# Patient Record
Sex: Male | Born: 1945
Health system: Southern US, Community
[De-identification: ages and names within clinical notes are randomized; demographics above are authoritative.]

## PROBLEM LIST (undated history)

## (undated) DIAGNOSIS — I350 Nonrheumatic aortic (valve) stenosis: Secondary | ICD-10-CM

## (undated) DIAGNOSIS — Z972 Presence of dental prosthetic device (complete) (partial): Secondary | ICD-10-CM

## (undated) DIAGNOSIS — N189 Chronic kidney disease, unspecified: Secondary | ICD-10-CM

## (undated) DIAGNOSIS — M1990 Unspecified osteoarthritis, unspecified site: Secondary | ICD-10-CM

## (undated) DIAGNOSIS — F329 Major depressive disorder, single episode, unspecified: Secondary | ICD-10-CM

## (undated) DIAGNOSIS — Z973 Presence of spectacles and contact lenses: Secondary | ICD-10-CM

## (undated) DIAGNOSIS — Z951 Presence of aortocoronary bypass graft: Secondary | ICD-10-CM

## (undated) DIAGNOSIS — I69959 Hemiplegia and hemiparesis following unspecified cerebrovascular disease affecting unspecified side: Secondary | ICD-10-CM

## (undated) DIAGNOSIS — F419 Anxiety disorder, unspecified: Secondary | ICD-10-CM

## (undated) DIAGNOSIS — I6529 Occlusion and stenosis of unspecified carotid artery: Secondary | ICD-10-CM

## (undated) DIAGNOSIS — I219 Acute myocardial infarction, unspecified: Secondary | ICD-10-CM

## (undated) DIAGNOSIS — R011 Cardiac murmur, unspecified: Secondary | ICD-10-CM

## (undated) DIAGNOSIS — I340 Nonrheumatic mitral (valve) insufficiency: Secondary | ICD-10-CM

## (undated) DIAGNOSIS — E785 Hyperlipidemia, unspecified: Secondary | ICD-10-CM

## (undated) DIAGNOSIS — Z974 Presence of external hearing-aid: Secondary | ICD-10-CM

## (undated) DIAGNOSIS — R17 Unspecified jaundice: Secondary | ICD-10-CM

## (undated) DIAGNOSIS — G4730 Sleep apnea, unspecified: Secondary | ICD-10-CM

## (undated) DIAGNOSIS — R7303 Prediabetes: Secondary | ICD-10-CM

## (undated) DIAGNOSIS — I509 Heart failure, unspecified: Secondary | ICD-10-CM

## (undated) DIAGNOSIS — I639 Cerebral infarction, unspecified: Secondary | ICD-10-CM

## (undated) DIAGNOSIS — I2510 Atherosclerotic heart disease of native coronary artery without angina pectoris: Secondary | ICD-10-CM

## (undated) DIAGNOSIS — Z8679 Personal history of other diseases of the circulatory system: Secondary | ICD-10-CM

## (undated) DIAGNOSIS — I10 Essential (primary) hypertension: Secondary | ICD-10-CM

## (undated) DIAGNOSIS — I351 Nonrheumatic aortic (valve) insufficiency: Secondary | ICD-10-CM

## (undated) DIAGNOSIS — F32A Depression, unspecified: Secondary | ICD-10-CM

## (undated) DIAGNOSIS — J439 Emphysema, unspecified: Secondary | ICD-10-CM

## (undated) DIAGNOSIS — D649 Anemia, unspecified: Secondary | ICD-10-CM

## (undated) DIAGNOSIS — K219 Gastro-esophageal reflux disease without esophagitis: Secondary | ICD-10-CM

## (undated) DIAGNOSIS — Z953 Presence of xenogenic heart valve: Secondary | ICD-10-CM

## (undated) DIAGNOSIS — Z7689 Persons encountering health services in other specified circumstances: Secondary | ICD-10-CM

## (undated) DIAGNOSIS — I480 Paroxysmal atrial fibrillation: Secondary | ICD-10-CM

## (undated) DIAGNOSIS — E119 Type 2 diabetes mellitus without complications: Secondary | ICD-10-CM

## (undated) DIAGNOSIS — I080 Rheumatic disorders of both mitral and aortic valves: Secondary | ICD-10-CM

## (undated) DIAGNOSIS — Z9889 Other specified postprocedural states: Secondary | ICD-10-CM

## (undated) DIAGNOSIS — J449 Chronic obstructive pulmonary disease, unspecified: Secondary | ICD-10-CM

## (undated) HISTORY — DX: Rheumatic disorders of both mitral and aortic valves: I08.0

## (undated) HISTORY — DX: Paroxysmal atrial fibrillation: I48.0

## (undated) HISTORY — DX: Nonrheumatic aortic (valve) insufficiency: I35.1

## (undated) HISTORY — DX: Hemiplegia and hemiparesis following unspecified cerebrovascular disease affecting unspecified side: I69.959

## (undated) HISTORY — PX: POLYPECTOMY: SHX149

## (undated) HISTORY — PX: FOOT SURGERY: SHX648

## (undated) HISTORY — DX: Atherosclerotic heart disease of native coronary artery without angina pectoris: I25.10

## (undated) HISTORY — PX: TONSILLECTOMY: SUR1361

## (undated) HISTORY — PX: CHOLECYSTECTOMY: SHX55

## (undated) HISTORY — PX: BACK SURGERY: SHX140

## (undated) HISTORY — DX: Essential (primary) hypertension: I10

## (undated) HISTORY — DX: Hyperlipidemia, unspecified: E78.5

---

## 2003-03-10 ENCOUNTER — Ambulatory Visit (HOSPITAL_COMMUNITY): Admission: RE | Admit: 2003-03-10 | Discharge: 2003-03-10 | Payer: Self-pay

## 2003-03-10 ENCOUNTER — Encounter (INDEPENDENT_AMBULATORY_CARE_PROVIDER_SITE_OTHER): Payer: Self-pay | Admitting: Specialist

## 2003-03-11 ENCOUNTER — Inpatient Hospital Stay (HOSPITAL_COMMUNITY): Admission: EM | Admit: 2003-03-11 | Discharge: 2003-03-16 | Payer: Self-pay | Admitting: Emergency Medicine

## 2003-03-11 ENCOUNTER — Encounter: Payer: Self-pay | Admitting: Pediatrics

## 2003-03-11 DIAGNOSIS — I639 Cerebral infarction, unspecified: Secondary | ICD-10-CM

## 2003-03-11 HISTORY — DX: Cerebral infarction, unspecified: I63.9

## 2003-03-12 ENCOUNTER — Encounter: Payer: Self-pay | Admitting: Pediatrics

## 2003-03-13 ENCOUNTER — Encounter: Payer: Self-pay | Admitting: Neurology

## 2003-04-22 ENCOUNTER — Encounter: Admission: RE | Admit: 2003-04-22 | Discharge: 2003-07-21 | Payer: Self-pay | Admitting: Pediatrics

## 2003-04-23 ENCOUNTER — Ambulatory Visit (HOSPITAL_COMMUNITY): Admission: RE | Admit: 2003-04-23 | Discharge: 2003-04-23 | Payer: Self-pay | Admitting: Rheumatology

## 2003-06-18 ENCOUNTER — Ambulatory Visit (HOSPITAL_COMMUNITY): Admission: RE | Admit: 2003-06-18 | Discharge: 2003-06-18 | Payer: Self-pay | Admitting: Interventional Radiology

## 2004-01-12 ENCOUNTER — Ambulatory Visit (HOSPITAL_COMMUNITY): Admission: RE | Admit: 2004-01-12 | Discharge: 2004-01-12 | Payer: Self-pay | Admitting: Interventional Radiology

## 2004-02-03 ENCOUNTER — Ambulatory Visit (HOSPITAL_COMMUNITY): Admission: RE | Admit: 2004-02-03 | Discharge: 2004-02-03 | Payer: Self-pay | Admitting: Interventional Radiology

## 2004-06-25 ENCOUNTER — Encounter: Payer: Self-pay | Admitting: Internal Medicine

## 2004-06-25 ENCOUNTER — Ambulatory Visit (HOSPITAL_COMMUNITY): Admission: RE | Admit: 2004-06-25 | Discharge: 2004-06-25 | Payer: Self-pay | Admitting: Internal Medicine

## 2004-11-11 ENCOUNTER — Ambulatory Visit: Payer: Self-pay | Admitting: Internal Medicine

## 2005-01-12 ENCOUNTER — Ambulatory Visit: Payer: Self-pay

## 2005-08-01 ENCOUNTER — Ambulatory Visit: Payer: Self-pay | Admitting: Internal Medicine

## 2005-08-12 ENCOUNTER — Ambulatory Visit: Payer: Self-pay | Admitting: Internal Medicine

## 2005-09-09 ENCOUNTER — Ambulatory Visit: Payer: Self-pay | Admitting: Internal Medicine

## 2005-09-16 ENCOUNTER — Ambulatory Visit: Payer: Self-pay | Admitting: Internal Medicine

## 2005-10-27 ENCOUNTER — Ambulatory Visit: Payer: Self-pay | Admitting: Internal Medicine

## 2005-10-27 ENCOUNTER — Ambulatory Visit: Payer: Self-pay

## 2006-01-27 ENCOUNTER — Ambulatory Visit: Payer: Self-pay | Admitting: Internal Medicine

## 2006-04-27 ENCOUNTER — Ambulatory Visit: Payer: Self-pay

## 2006-05-01 ENCOUNTER — Ambulatory Visit: Payer: Self-pay | Admitting: Internal Medicine

## 2006-08-23 ENCOUNTER — Encounter: Admission: RE | Admit: 2006-08-23 | Discharge: 2006-08-23 | Payer: Self-pay | Admitting: Emergency Medicine

## 2007-01-19 ENCOUNTER — Ambulatory Visit: Payer: Self-pay

## 2007-01-19 ENCOUNTER — Encounter: Payer: Self-pay | Admitting: Internal Medicine

## 2007-01-29 ENCOUNTER — Ambulatory Visit: Payer: Self-pay | Admitting: Internal Medicine

## 2007-07-25 ENCOUNTER — Ambulatory Visit: Payer: Self-pay

## 2007-11-26 ENCOUNTER — Ambulatory Visit: Payer: Self-pay | Admitting: Internal Medicine

## 2008-04-07 ENCOUNTER — Encounter: Payer: Self-pay | Admitting: Interventional Radiology

## 2008-08-12 ENCOUNTER — Ambulatory Visit: Payer: Self-pay

## 2008-08-12 ENCOUNTER — Encounter: Payer: Self-pay | Admitting: Internal Medicine

## 2008-08-14 ENCOUNTER — Ambulatory Visit: Payer: Self-pay | Admitting: Internal Medicine

## 2009-02-16 ENCOUNTER — Encounter: Payer: Self-pay | Admitting: Interventional Radiology

## 2009-04-24 ENCOUNTER — Encounter: Payer: Self-pay | Admitting: Internal Medicine

## 2009-04-30 ENCOUNTER — Encounter: Payer: Self-pay | Admitting: Internal Medicine

## 2009-06-25 DIAGNOSIS — E785 Hyperlipidemia, unspecified: Secondary | ICD-10-CM | POA: Insufficient documentation

## 2009-06-25 DIAGNOSIS — Z9089 Acquired absence of other organs: Secondary | ICD-10-CM

## 2009-06-25 DIAGNOSIS — I080 Rheumatic disorders of both mitral and aortic valves: Secondary | ICD-10-CM | POA: Insufficient documentation

## 2009-06-25 DIAGNOSIS — I2510 Atherosclerotic heart disease of native coronary artery without angina pectoris: Secondary | ICD-10-CM | POA: Insufficient documentation

## 2009-06-25 DIAGNOSIS — I679 Cerebrovascular disease, unspecified: Secondary | ICD-10-CM

## 2009-06-25 DIAGNOSIS — J441 Chronic obstructive pulmonary disease with (acute) exacerbation: Secondary | ICD-10-CM

## 2009-06-25 DIAGNOSIS — Z9189 Other specified personal risk factors, not elsewhere classified: Secondary | ICD-10-CM | POA: Insufficient documentation

## 2009-06-25 DIAGNOSIS — I69959 Hemiplegia and hemiparesis following unspecified cerebrovascular disease affecting unspecified side: Secondary | ICD-10-CM | POA: Insufficient documentation

## 2009-06-25 DIAGNOSIS — I359 Nonrheumatic aortic valve disorder, unspecified: Secondary | ICD-10-CM

## 2009-06-25 DIAGNOSIS — I10 Essential (primary) hypertension: Secondary | ICD-10-CM | POA: Insufficient documentation

## 2009-06-25 DIAGNOSIS — I38 Endocarditis, valve unspecified: Secondary | ICD-10-CM | POA: Insufficient documentation

## 2009-07-02 ENCOUNTER — Ambulatory Visit: Payer: Self-pay | Admitting: Internal Medicine

## 2009-08-13 ENCOUNTER — Encounter: Payer: Self-pay | Admitting: Internal Medicine

## 2009-08-13 ENCOUNTER — Ambulatory Visit: Payer: Self-pay

## 2009-10-15 ENCOUNTER — Telehealth (INDEPENDENT_AMBULATORY_CARE_PROVIDER_SITE_OTHER): Payer: Self-pay | Admitting: *Deleted

## 2010-02-11 ENCOUNTER — Encounter: Payer: Self-pay | Admitting: Internal Medicine

## 2010-02-22 ENCOUNTER — Ambulatory Visit: Payer: Self-pay | Admitting: Internal Medicine

## 2010-02-22 DIAGNOSIS — F17200 Nicotine dependence, unspecified, uncomplicated: Secondary | ICD-10-CM | POA: Insufficient documentation

## 2010-08-27 ENCOUNTER — Encounter: Payer: Self-pay | Admitting: Internal Medicine

## 2010-08-31 ENCOUNTER — Encounter: Payer: Self-pay | Admitting: Internal Medicine

## 2010-08-31 ENCOUNTER — Ambulatory Visit: Payer: Self-pay

## 2010-10-20 ENCOUNTER — Encounter: Payer: Self-pay | Admitting: Internal Medicine

## 2010-11-19 ENCOUNTER — Encounter: Payer: Self-pay | Admitting: Internal Medicine

## 2010-11-19 ENCOUNTER — Ambulatory Visit (HOSPITAL_COMMUNITY)
Admission: RE | Admit: 2010-11-19 | Discharge: 2010-11-19 | Payer: Self-pay | Source: Home / Self Care | Attending: Internal Medicine | Admitting: Internal Medicine

## 2010-11-19 ENCOUNTER — Ambulatory Visit: Payer: Self-pay

## 2010-11-29 ENCOUNTER — Ambulatory Visit: Payer: Self-pay | Admitting: Internal Medicine

## 2011-01-11 NOTE — Miscellaneous (Signed)
Summary: Orders Update  Clinical Lists Changes  Orders: Added new Test order of Carotid Duplex (Carotid Duplex) - Signed 

## 2011-01-11 NOTE — Assessment & Plan Note (Signed)
Summary: 8 month rov/sl  Medications Added FOLBEE Q000111Q MG TABS (FOLIC ACID-VIT Q000111Q 123456) 1 tab once daily      Allergies Added: NKDA  Visit Type:  Follow-up Primary Provider:  dr Hosie Poisson DR Burnett Harry is no longer with that group  CC:  no complaints-pt smokes about .  History of Present Illness: Patient is a 65 year old with a history of CV disease (s/p PTA to R carotid in 2000), dyslipidemia, moderate mitral regurgitation and HTN.  I last saw him in last July. Since seen, he denies chest pain, no shortness of breath.  No weakness, numbness.  Current Medications (verified): 1)  Advicor 500-20 Mg Xr24h-Tab (Niacin-Lovastatin) .... Take 2 Tabs At Qhs 2)  Lisinopril 10 Mg Tabs (Lisinopril) .... Take One Tablet By Mouth Daily 3)  Aspirin 81 Mg Tabs (Aspirin) .... Take By Mouth Once Daily 4)  Metoprolol Succinate 50 Mg Xr24h-Tab (Metoprolol Succinate) .... Take By Mouth Once Daily 5)  Ranitidine 75 75 Mg Tabs (Ranitidine Hcl) .... Take By Mouth Two Times A Day 6)  Citalopram Hydrobromide 20 Mg Tabs (Citalopram Hydrobromide) .... Take By Mouth Once Daily 7)  Budeprion Sr 150 Mg Xr12h-Tab (Bupropion Hcl) .... Takie By Mouth Two Times A Day 8)  Multivitamins  Tabs (Multiple Vitamin) .... Take By Mouth Once Daily 9)  Folbee Q000111Q Mg Tabs (Folic Acid-Vit Q000111Q 123456) .[name].. 1 Tab Once Daily  Allergies (verified): No Known Drug Allergies  Past History:  Past Medical History: Last updated: 06/25/2009 COPD (ICD-496) CEREBROVASCULAR ACCIDENT WITH RIGHT HEMIPARESIS (ICD-438.20) CEREBROVASCULAR DISEASE (ICD-437.9) MITRAL INSUFFICIENCY (ICD-396.3) AORTIC INSUFFICIENCY (ICD-424.1) VALVULAR HEART DISEASE (ICD-424.90) DYSLIPIDEMIA (ICD-272.4) CARDIOVASCULAR DISEASE (ICD-429.2) HYPERTENSION (ICD-401.9)    Past Surgical History: Last updated: 06/25/2009 * HX OF BONE SPUR REMOVED RIGHT FOOT. * BACK SURGERY, HX OF TONSILLECTOMY, HX OF (ICD-V45.79) FOOT SURGERY, HX OF  (ICD-V15.89) POLYPECTOMY, HX OF (ICD-V15.9)  Social History: Last updated: 06/25/2009 Retired  Married  Tobacco Use - Yes.   Review of Systems       All systems reviewed.  Negative to hte above prob except as noted.  Vital Signs:  Patient profile:   65 year old male Height:      72 inches Weight:      273 pounds BMI:     37.16 Pulse rate:   56 / minute BP sitting:   120 / 63  (left arm) Cuff size:   large  Vitals Entered By: Lubertha Basque, CNA (February 22, 2010 10:07 AM)  Physical Exam  Additional Exam:  Obese 65 year old in NAD HEENT:  Normocephalic, atraumatic. EOMI, PERRLA.  Neck: JVP is normal. No thyromegaly. No bruits.  Lungs: clear to auscultation. No rales no wheezes.  Heart: Regular rate and rhythm. Normal S1, S2. No S3.  Gr 1 to 2/6 systolic murmur apex.[name] PMI not displaced.  Abdomen:  Supple, nontender. Normal bowel sounds. No masses. No hepatomegaly.  Extremities:   Good distal pulses throughout. No lower extremity edema.  Musculoskeletal :moving all extremities.  Neuro:   alert and oriented x3.    EKG  Procedure date:  02/22/2010  Findings:      Sinus bradycardia.  54 bpm.  NOnspecific ST changes.  Impression & Recommendations:  Problem # 1:  CEREBROVASCULAR ACCIDENT WITH RIGHT HEMIPARESIS (ICD-438.20)  His updated medication list for this problem includes:    Aspirin 81 Mg Tabs (Aspirin) .[name]... Take by mouth once daily Patient has periodic carotid USN.  Continue.  Problem # 2:  MITRAL INSUFFICIENCY (  ICD-396.3) Will get echo on next visit.  Problem # 3:  DYSLIPIDEMIA (ICD-272.4) patient had recent lipids  will get from primary MD. His updated medication list for this problem includes:    Advicor 500-20 Mg Xr24h-tab (Niacin-lovastatin) .[name]... Take 2 tabs at qhs  Problem # 4:  HYPERTENSION (ICD-401.9) Continue meds.  Good control Counselled on exercise and wt loss. His updated medication list for this problem includes:    Lisinopril 10 Mg Tabs  (Lisinopril) .[name]... Take one tablet by mouth daily    Aspirin 81 Mg Tabs (Aspirin) .[name]... Take by mouth once daily    Metoprolol Succinate 50 Mg Xr24h-tab (Metoprolol succinate) .[name]... Take by mouth once daily  Problem # 5:  TOBACCO ABUSE (ICD-305.1) Smokes 4 to 5 per day.  Counselled on quitting.  Other Orders: EKG w/ Interpretation (93000)  Patient Instructions: 1)  F/U next winter with appt and echo. 2)  Walk.

## 2011-01-13 NOTE — Assessment & Plan Note (Signed)
Summary: per check out/also echo sameday/saf      Allergies Added: NKDA  Visit Type:  Follow-up Primary Provider:  dr Hosie Poisson DR Burnett Harry is no longer with that group  CC:  no complaints.  History of Present Illness: Patient is a 65 year old with a history of CV disease (s/p PTA to R carotid in 2000), dyslipidemia, moderate mitral regurgitation and HTN.  I last saw him in last March SInce seen he denies SOB, dizziness, weakness, numbness, chest pain.  He is active, working out  about 5 times per week. Carotid USN earlier this year was  with mild disease. Echo shows normal LV function, mild MR, mild/moderate AI  LA was 51 mm.  Current Medications (verified): 1)  Advicor 500-20 Mg Xr24h-Tab (Niacin-Lovastatin) .... Take 2 Tabs At Qhs 2)  Lisinopril 10 Mg Tabs (Lisinopril) .... Take One Tablet By Mouth Daily 3)  Aspirin 81 Mg Tabs (Aspirin) .... Take By Mouth Once Daily 4)  Metoprolol Succinate 50 Mg Xr24h-Tab (Metoprolol Succinate) .... Take By Mouth Once Daily 5)  Ranitidine 75 75 Mg Tabs (Ranitidine Hcl) .... Take By Mouth Two Times A Day 6)  Citalopram Hydrobromide 20 Mg Tabs (Citalopram Hydrobromide) .... Take By Mouth Once Daily 7)  Budeprion Sr 150 Mg Xr12h-Tab (Bupropion Hcl) .... Takie By Mouth Two Times A Day 8)  Multivitamins  Tabs (Multiple Vitamin) .... Take By Mouth Once Daily 9)  Folbee Q000111Q Mg Tabs (Folic Acid-Vit Q000111Q 123456) .[name].. 1 Tab Once Daily  Allergies (verified): No Known Drug Allergies  Past History:  Past medical, surgical, family and social histories (including risk factors) reviewed, and no changes noted (except as noted below).  Past Medical History: Reviewed history from 06/25/2009 and no changes required. COPD (ICD-496) CEREBROVASCULAR ACCIDENT WITH RIGHT HEMIPARESIS (ICD-438.20) CEREBROVASCULAR DISEASE (ICD-437.9) MITRAL INSUFFICIENCY (ICD-396.3) AORTIC INSUFFICIENCY (ICD-424.1) VALVULAR HEART DISEASE (ICD-424.90) DYSLIPIDEMIA  (ICD-272.4) CARDIOVASCULAR DISEASE (ICD-429.2) HYPERTENSION (ICD-401.9)    Past Surgical History: Reviewed history from 06/25/2009 and no changes required. * HX OF BONE SPUR REMOVED RIGHT FOOT. * BACK SURGERY, HX OF TONSILLECTOMY, HX OF (ICD-V45.79) FOOT SURGERY, HX OF (ICD-V15.89) POLYPECTOMY, HX OF (ICD-V15.9)  Family History: Reviewed history from 06/25/2009 and no changes required. Father:Medical history unknown Mother:Died in 28"s cause unknown hx of coronary artery disease and cerebrovascular disease.  Social History: Reviewed history from 06/25/2009 and no changes required. Retired  Married  Tobacco Use - Yes.   Review of Systems       All systems reviewed.  Neg to the abvoe problem.  Vital Signs:  Patient profile:   65 year old male Height:      72 inches Weight:      274 pounds BMI:     37.30 Pulse rate:   54 / minute BP sitting:   102 / 59  (left arm) Cuff size:   regular  Vitals Entered By: Hansel Feinstein CMA (November 29, 2010 4:22 PM)  Physical Exam  Additional Exam:  patient is in NAD HEENT:  Normocephalic, atraumatic. EOMI, PERRLA.  Neck: JVP is normal. No thyromegaly. No bruits.  Lungs: clear to auscultation. No rales no wheezes.  Heart: Regular rate and rhythm. Normal S1, S2. No S3.   No significant murmurs. PMI not displaced.  Abdomen:  Supple, nontender. Normal bowel sounds. No masses. No hepatomegaly.  Extremities:   Good distal pulses throughout. No lower extremity edema.  Musculoskeletal :moving all extremities.  Neuro:   alert and oriented x3.    Impression & Recommendations:  Problem # 1:  CEREBROVASCULAR DISEASE (ICD-437.9) No symptoms.  Recent vascular exam with mild dz.  Problem # 2:  MITRAL INSUFFICIENCY (ICD-396.3) MIld  Follow  Problem # 3:  AORTIC INSUFFICIENCY (ICD-424.1) mild to moderate.  Follow  Problem # 4:  DYSLIPIDEMIA (ICD-272.4) will get lipids from Dr. Caesar Chestnut office.  Glucose as well.  May pull back on  Niaspan. His updated medication list for this problem includes:    Advicor 500-20 Mg Xr24h-tab (Niacin-lovastatin) .[name]... Take 2 tabs at qhs  Patient Instructions: 1)  Your physician recommends that you schedule a follow-up appointment in: 12 months. 2)  Your physician recommends that you continue on your current medications as directed. Please refer to the Current Medication list given to you today. 3)  Your physician recommends that you return for lab work in: Dr. Oleta Mouse office aware to fax to the office, BMET, and LIPID profile.

## 2011-04-26 NOTE — Assessment & Plan Note (Signed)
[place]                            CARDIOLOGY OFFICE NOTE   NAME:Brandon [name], Brandon [name]                     MRN:          [id number]  DATE:08/14/2008                            DOB:          January 20, 1946    IDENTIFICATION:  The patient is a 65 year old gentleman with a history  of CVA, status post PTCA to the right carotid in 2004 and also has a  history of mild-to-moderate MR, hypertension and dyslipidemia.   I last saw him back in December in clinic since then he has been seen  actually by Dr. Estanislado Pandy at [facility].  He also had a carotid scan done on  August 12, 2008, that showed patent stent site with 0-39% stenosis,  turbulent flow in the left valve with 0-39% stenoses.   He denies any numbness or weakness.  He denies any chest pressure.  He  smokes very little and not too active.  No chest pain.   CURRENT MEDICATIONS:  1. Iron.  2. Foltx daily.  3. Ranitidine 75 b.i.d.  4. Aspirin 81.  5. Multivitamin.  6. Citalopram 20.  7. Prion SR 150 b.i.d.  8. Advicor 20/500 two q.h.s.  9. Metoprolol 50.  10.Lisinopril 10.   PHYSICAL EXAMINATION:  GENERAL:  The patient is in no distress.  VITAL SIGNS:  Blood pressure is 127/65, pulse is 51 and regular, weight  271.  NECK:  Unable to assess JVP.  Mild bruit on the right.  LUNGS:  Clear.  CARDIAC:  Regular rate and rhythm, S1 and S2.  No S3.  Soft murmur in  the apex, 1-2/6.  ABDOMEN:  Benign, obese.  EXTREMITIES:  No edema.   IMPRESSION:  1. Cardiovascular disease.  Again, we will follow up periodic carotid      scan, followup planned in 1 year.  2. Dyslipidemia.  Last lipid panel done in 2008, will need to have      repeat.  I have given him a prescription, so he can have it done      locally and faxed here.  3. Hypertension, adequate control.  4. Health care maintenance.  Counseled on tobacco.  Encouraged him to      increase his walking.  Try to get his weight down.  Again 30      minutes per  day moderate activity.     Fay Records, MD, [facility]  Electronically Signed    PVR/MedQ  DD: 08/14/2008  DT: 08/15/2008  Job #: [id number]

## 2011-04-26 NOTE — Assessment & Plan Note (Signed)
[place] OFFICE NOTE   NAME:Audia, LEE BAU                     MRN:          [id number]  DATE:11/26/2007                            DOB:          [birth year]    IDENTIFICATION:  The patient is a 65 year old gentleman whom I have  followed in the cardiology clinic.  He has a history of a CVA, underwent  carotid stenting.  I last saw him in cardiology clinic back in February.  Also has a history of mild to moderate mitral regurgitation,  hypertension, dyslipidemia.   Since seen, he has been okay.  He denies any TIA-like symptoms.  Breathing is good.  He denies chest pain.  He says he is walking some.  Lipids done in September, total cholesterol was 152, LDL 92, HDL 49,  triglycerides 113 (on Advicor).   PHYSICAL EXAM:  The patient is in no distress.  His blood pressure here is 159/72, usually it runs in the 100s at home  (he was stopped at a road block this morning and was late getting here).  Pulse 69 and regular, weight 268, which is stable from last visit.  NECK:  Mild bruit on the right.  LUNGS:  Clear.  CARDIAC:  Regular rate and rhythm.  S1, S2.  No S3.  No murmur audible.  ABDOMEN:  Obese.  No masses.  Supple.  EXTREMITIES:  2+ posterior tibial.  No edema.   MEDICATIONS:  1. Foltx daily.  2. Zantac 75 b.i.d.  3. Aspirin 81.  4. Multivitamin.  5. Citalopram 20.  6. Prion SR 150 b.i.d.  7. Avacor 20/500 two nightly.  8. Metoprolol 50 ER.  9. Lisinopril 10.   IMPRESSION:  1. Cardiovascular disease.  Last carotid Doppler was in August.      Unstable disease.  Plan for followup in about a year from that.      Would keep on same medicines.  2. Mitral valve disease.  On exam, I do not hear a murmur.  With his      size, I will set him up for an echo.  It will be over a year-and-a-      half in September.  3. Hypertension.  It sounds like it is better controlled at home.  He      did have a rough  morning.  Again, I will not make any changes.  4. Dyslipidemia.  Good control.  Would continue.   I will set to see the patient back in September of this year.  Sooner if  problems develop.     Fay Records, MD, [facility]  Electronically Signed    PVR/MedQ  DD: 11/26/2007  DT: 11/26/2007  Job #: [id number]   cc:   Zella Richer. Burnett Harry, M.D.

## 2011-04-26 NOTE — Consult Note (Signed)
NAMEMONTRELLE, Brandon [name]              ACCOUNT NO.:  [id number]   MEDICAL RECORD NO.:  [id number]           PATIENT TYPE:   LOCATION:                                 FACILITY:   PHYSICIAN:  Brandon [name], P.A.   DATE OF BIRTH:  1946-12-10   DATE OF CONSULTATION:  04/04/2008  DATE OF DISCHARGE:                                 CONSULTATION   CHIEF COMPLAINT:  Cerebrovascular disease.   HISTORY OF PRESENT ILLNESS:  This is a pleasant 65 year old male who was  seen by Dr. Estanislado [name] back in 2004 after the patient presented with an  acute right CVA.  The patient underwent an emergent right carotid PTA  stent performed just after admission on March 11, 2003.  The patient had  a repeat angiogram on February 03, 2004 that showed a 10-15% stenosis of  the right internal carotid artery with a 25% left internal carotid  artery stenosis.  Further followup was recommended in 6 months.  However, the patient was lost to subsequent followup.   The patient has been followed by Dr. Dorris [name] for his cerebrovascular  disease.  Apparently, he had an ultrasound of his carotids last year.  We will request a copy of that report.  According to the patient  everything looked fine.  The patient has been asymptomatic.  Overall, he  has been doing well.  He presents today with his wife to be seen for  further followup.   PAST MEDICAL HISTORY:  Significant for  1. Hyperlipidemia as noted.  2. He had a right CVA in 2004 with subsequent PTA stenting of the      right internal carotid artery.  3. He has a history of hypertension.  4. History of COPD with continued tobacco use.  5. He has mild-to-moderate mitral regurgitation with normal left      ventricular function ejection fraction 123456 with diastolic      dysfunction and mild-to-moderate AI.  He does need SBE prophylaxis      for procedures.  6. The patient had a colonoscopy and polypectomy just before his      stroke in 2004.  He recently had a repeat  colonoscopy without      incident.  7. Following the angiogram performed by Dr. Estanislado [name] on February 06, 2004 the patient apparently had some further bleeding from his      groin after returning home.  The patient's wife was able to stop      the bleeding by applying direct pressure.   SURGICAL HISTORY:  The patient has had surgery on his left foot.  He has  also had a polypectomy.  The patient had a tonsillectomy in 1985.  He  had back surgery in 1986.  He had a bone spur removed from his right  foot in 1989.   ALLERGIES:  No known drug allergies.  He is allergic to BEE STINGS.  He  has had no problems with CONTRAST DYE in the past.   CURRENT MEDICATIONS:  Include  1. Metoprolol XL 50 mg daily.  2. Lisinopril  10 mg daily.  3. Full B vitamin supplement 1 daily.  4. Citalopram 20 mg daily.  5. Budeprion SR 150 mg twice daily.  6. Advicor 20/500 twice daily.  7. Zantac 75 mg twice daily.  8. Aspirin 81 mg daily.  9. Multivitamin daily.   SOCIAL HISTORY:  The patient is married.  His wife works in admitting at  [employer].  Each have children separately.  They live in  [place].  The patient has been smoking for over 40 years.  He has  cut down considerably to only 2-3 cigarettes per day.  He drinks alcohol  rarely.  He has been retired from Psychologist, prison and probation services.   FAMILY HISTORY:  The patient's mother died in her 81s, the cause was  unknown.  She had coronary artery disease as well as cerebrovascular  disease.  His father's history is unknown.   IMPRESSION AND PLAN:  As noted, the patient and his wife presented today  for further followup after undergoing an emergent right carotid artery  PTA stent on March 12, 2003 for a right cerebrovascular accident.  The  patient made a significant recovery from his stroke.  His last angiogram  was performed on February 03, 2004.  At that time the stent appeared to  be patent.  The patient has had no further symptoms.  He  had a carotid  ultrasound last year performed at Dr. Nevin Bloodgood [name]'s office.  Overall, he  appears to be doing well.   Dr. Estanislado [name] did not recommend any followup procedures at this time  although he would like to see the patient again in approximately 1 year.   Dr. Estanislado [name] did review the results of the previous angiograms with the  patient and his wife.  All of their questions were answered.  Greater  than 30 minutes was spent on this consult.      Brandon [name], P.A.     DR/MEDQ  D:  04/07/2008  T:  04/08/2008  Job:  [id number]   cc:   Brandon [name]. Brandon [name], M.D.  Brandon Records, MD, Brandon [name], M.D.

## 2011-04-26 NOTE — Consult Note (Signed)
NAMEUSAMAH, NETHERCOTT              ACCOUNT NO.:  [id number]   MEDICAL RECORD NO.:  [id number]          PATIENT TYPE:  OUT   LOCATION:  XRAY                         FACILITY:  [facility]   PHYSICIAN:  Sanjeev K. Deveshwar, M.D.DATE OF BIRTH:  [birth year]   DATE OF CONSULTATION:  02/16/2009  DATE OF DISCHARGE:                                 CONSULTATION   CHIEF COMPLAINT:  Cerebrovascular disease.   HISTORY OF PRESENT ILLNESS:  This is a very pleasant 65 year old male  who was admitted to [facility] on March 11, 2003, with an acute  CVA.  The patient subsequently underwent emergent right carotid stent-  assisted angioplasty, performed by Dr. Estanislado Pandy on the day of  admission.  The patient had minimal residual deficits from his stroke.  A repeat cerebral angiogram was performed on February 03, 2004, that  showed a 10-15% stenosis of the right internal carotid artery with a 25%  stenosis of the left internal carotid artery.  For a time, the patient  was lost to follow up although he was last seen by Dr. Estanislado Pandy on  April 04, 2008.  At that time, Dr. Estanislado Pandy recommended a yearly  followup.  The patient returns today for that.   PAST MEDICAL HISTORY:  The patient has a history of hyperlipidemia.  He  had a right CVA in 2004 with stent-assisted angioplasty of the right  internal carotid artery.  He has hypertension.  He has a history of COPD  with continued tobacco use.  He has a history of mitral regurgitation  with normal left ventricular ejection fraction but with diastolic  dysfunction and mild to moderate aortic insufficiency.  The patient  requires SBE prophylaxis for procedures.  He had a colonoscopy and  polypectomy just before his stroke in 2004.  He had a repeat colonoscopy  without incident.   SURGICAL HISTORY:  Significant for surgery to the left foot, as well as  his polypectomy.  He also had a tonsillectomy in 1985.  He had back  surgery in 1986.  He had a bone  spur removed from his right foot in  1989.   ALLERGIES:  He has no known drug allergies.  He is allergic to BEE  STINGS.   CURRENT MEDICATIONS:  1. Metoprolol 50 mg daily.  2. Lisinopril 10 mg daily.  3. Vitamins daily.  4. Citalopram 20 mg daily.  5. Wellbutrin SR 150 mg twice daily.  6. Advicor 20/500 two at bedtime.  7. Zantac 75 mg 1 b.i.d.  8. Aspirin 81 mg daily.  9. Multivitamin daily.   SOCIAL HISTORY:  The patient is married.  His wife works in admitting at  [employer].  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  The patient and his wife have children separately.  The  patient has smoked for over 40 years.  He has cut down considerably to 2-  3 cigarettes per day; however, he has been unable to quit.  He drinks  alcohol rarely.  He and his wife live in [place].   FAMILY HISTORY:  The patient's mother  died in her 45s.  The cause was  unknown.  She had coronary artery disease, as well as cerebrovascular  disease.  His father's medical history is unknown.   IMPRESSION AND PLAN:  As noted, the patient returns today to be seen in  followup.  Apparently, Dr. Harrington Challenger has been following his vascular disease  with yearly carotid ultrasounds.  His most recent study was in September  2009.  This revealed patency of the stent site with 0-39% stenosis with  turbulent flow in the left carotid with 0-39% stenosis.   The patient reports that he is no longer taking Plavix.  He has been on  aspirin 81 mg daily and has been asymptomatic.  Unfortunately, as noted  he does continue to smoke several cigarettes per day.  Again, he was  encouraged to try to quit.  Apparently, he will continue to have a  regular ultrasounds through Dr. Harrington Challenger.  We have requested that the  patient make arrangements to have a copy of the study sent to [place].  The  patient is to call us in the interim if he develops any new neurologic  symptoms.  Dr. Estanislado Pandy reviewed the results  of the patient's previous  angiograms with the patient.  All of the patient's questions were  answered.  Greater than 15 minutes was spent on this followup visit.  Dr. Estanislado Pandy would like to see the patient back in 1 year.      Mikey Bussing, P.A.    ______________________________  Fritz Pickerel. Estanislado Pandy, M.D.    DR/MEDQ  D:  02/16/2009  T:  02/17/2009  Job:  [id number]   cc:   Zella Richer. Burnett Harry, M.D.  Fay Records, MD, [place] Gaynell Face, M.D.

## 2011-04-29 NOTE — H&P (Signed)
Brandon [name], Brandon [name]                        ACCOUNT NO.:  [id number]   MEDICAL RECORD NO.:  [id number]                   PATIENT TYPE:  INP   LOCATION:  3110                                 FACILITY:  [facility]   PHYSICIAN:  Princess Bruins. Gaynell Face, M.D.           DATE OF BIRTH:  08/15/1946   DATE OF ADMISSION:  03/11/2003  DATE OF DISCHARGE:                                HISTORY & PHYSICAL   CHIEF COMPLAINT:  Left-sided weakness, acute onset March 11, 2003, at 1850  hours.   HISTORY OF PRESENT ILLNESS:  The patient arrived at [facility] at 1930  hours.  CT scan was seen and interpreted by me at 1935 hours.  It showed  increased signal in the right middle cerebral artery.  There is no acute  stroke or hemorrhage.  The patient was at dinner and was noted to suddenly  slump to his left side with his face drooping with calling 911 who found him  to be flaccid on the left side with altered mental status.  He arrived at  [facility] moving his left arm and leg and arousable.   Yesterday, the patient had a colonoscopy with polypectomy x2, one in the  transverse colon and the other in the descending colon.  These were small  and were cauterized.  There were no diverticula, arteriovenous malformations  or other sources of bleeding.  I discussed this case with Dr. Estanislado Pandy,  neurologic interventionalist, Dr. Marney Setting, my partner, and Dr.  Teena Irani.  We were all in agreement that the likelihood of progression of  this stroke without TPA is great and the likelihood for bleeding is  moderate.  In trying to save the patient from a devastating, disabling  stroke, which potentially would be fatal, will assume the risk of possible  GI bleed from the polyp site.   PAST MEDICAL HISTORY:  1. Hypertension.  2. Obesity.  3. Mild dyslipidemia.   REVIEW OF SYMPTOMS:  The patient is now nauseated.  He is unable to pass his  urine, so a Foley catheter was placed.   FAMILY HISTORY:  His  mother had atherosclerotic cardiovascular disease and  cerebrovascular disease and died in her 48s.  We are not sure of the cause.  Father's history is unknown.   SOCIAL HISTORY:  The patient is married and he works for [employer].  He  smoked two packs per day and stopped in December.  He is married x19 years.   MEDICATIONS:  1. Wellbutrin 300 XR per day.  2. Diltiazem 240 SR per day.   ALLERGIES:  No known drug allergies.   PAST SURGICAL HISTORY:  1. Surgery on his foot.  2. Polypectomy yesterday.   PHYSICAL EXAMINATION:  VITAL SIGNS:  Blood pressure 190/125, resting pulse  84, respirations 22, pulse oximetry 100%, temperature 99.  HEENT:  No signs of infection, no bruits.  NECK:  Supple.  LUNGS:  Clear.  HEART:  No murmurs.  Pulses normal.  ABDOMEN:  Protuberant.  No masses, bowel sounds normal.  EXTREMITIES:  Negative for edema or cyanosis.  NEUROLOGIC:  The patient is awake and oriented to month, year, location and  circumstance.  He names objects.  He follows commands.  He is restless.  Cranial nerves with round, reactive pupils, fundi normal.  Visual fields  full to counting fingers.  He tends to have a right gaze preference, but he  can move his eyes to the left.  He has a mild left central VII and a midline  tongue.  Motor examination shows left hemiparesis that is mild.  On left  hand, he wiggles his fingers.  He can oppose them with his thumb.  He has 3-  to 4-/5 strength proximally in left hip flexion.  Otherwise, he has normal  strength in his psoas, knee and foot on the left.  Sensory with stereognosis  in the left hand.  He has good primary sensation.  Cerebellar examination  was okay on the right and poor on the left.  Gait cannot be tested.  I did  sit him up in order to allow him to urinate.  He did not slump to the left  side.  Deep tendon reflexes are absent.  He had bilateral flexor plantar  responses.   LABORATORY DATA AND X-RAY FINDINGS:  Review of the  patient's EKG showed  sinus rhythm with occasional PVC, left atrial enlargement with anterior  infarct of undetermined age.  It is likely that there is a cardiogenic  source for this stroke.  The infarction, if it exists, is within the  parietal lobe and would be a watershed distribution infarction from proximal  partial occlusion.   IMPRESSION:  I suspect a near total occlusion in his right middle cerebral  artery.  This may be an embolic source.   PLAN:  The patient has autoregulation causing compensatory hypertension.  We  can allow a blood pressure 190/100-110 with a TPA, but no higher.  Therefore, a small dose of labetalol will be tried to bring the blood  pressure down.  Heparin was started and promptly stopped as the decision was  made to go with intraarterial TPA once the  computed tomography scan became available.  I have discussed with this with  the patient's wife and sister who are in agreement.  We will proceed as soon  as the lead is ready.  Risks and benefits have been described to the family.   I appreciate the opportunity to participate in his care.                                               Princess Bruins. Gaynell Face, M.D.    [facility]  D:  03/11/2003  T:  03/12/2003  Job:  [id number]   cc:   Zella Richer. Burnett Harry, M.D.  [address]  [place]  [place] [address]  Fax: [phone]   Elyse Jarvis. Amedeo Plenty, M.D.  [id number] [place]  [place] [address]  Fax: [phone]

## 2011-04-29 NOTE — Discharge Summary (Signed)
Brandon [name], Brandon [name]                        ACCOUNT NO.:  [id number]   MEDICAL RECORD NO.:  [id number]                   PATIENT TYPE:  INP   LOCATION:  3022                                 FACILITY:  [facility]   PHYSICIAN:  Alyson Locket. Love, M.D.                 DATE OF BIRTH:  12-Feb-1946   DATE OF ADMISSION:  03/11/2003  DATE OF DISCHARGE:  03/16/2003                                 DISCHARGE SUMMARY   PATIENT ADDRESS:  [address]  [place], [place]  [place]   REASON FOR ADMISSION:  Mr. Faris Sollars is a 65 year old, right-handed  white married male, from [place], [place], who was admitted  urgently on 03/11/2003, with the acute onset of left-sided weakness.   HISTORY OF PRESENT ILLNESS:  Mr. Mollner has a known history of  hypertension and cigarette use, having discontinued cigarettes in 12/03.  He  had colonoscopy with polypectomy x2, one in the transverse colon and one in  the descending colon on 03/10/2003, by Jenny Reichmann C. Amedeo Plenty, M.D.  He noted the  onset of slumping to the left side, with left-sided weakness at the dinner  table on 03/11/03 with left arm and leg flaccidity and altered mental status.  He was brought by 911 to the emergency room and seen by Princess Bruins. Gaynell Face,  M.D. on an emergency basis.  It was noted that he had evidence of a right  brain stroke with CT scan showing no acute stroke or hemorrhage, but  increased signal in the right middle cerebral artery.  Despite the fact that  he had a polypectomy and considerations made thereof, he underwent  arteriography by Dr. Estanislado Pandy on an emergency basis.   PAST MEDICAL HISTORY:  Significant for hypertension, obesity, mild  dyslipidemia.  He has had surgery to his foot, polypectomy x2 one day prior  to admission.  He had a history of cigarette use and obesity, but had lost  20 pounds and then gained 6 pounds since 9/03.   ADMISSION MEDICATIONS:  Wellbutrin 150 mg XR twice per day, Diltiazem  240 mg  SR per day.  He had not been on aspirin because of recent polypectomy.   SOCIAL HISTORY:  Patient works for [employer].  He smoked two packs a day  which he stopped in December.  He has been married for 19 years.   ADMISSION EXAMINATION:  VITAL SIGNS:  Blood pressure 190/125, resting heart  rate was 84, respiratory rate 22.  Oximetry 100%/  Temperature was 99  degrees.  GENERAL:  Unremarkable except for obesity.  NEUROLOGIC:  Awake and oriented to month, year, location, circumstance, to  name objects.  He could follow commands.  He was very restless.  His visual  fields were full to count finger examination.  He had a right gaze  preference.  He could move his eyes to the left.  He had a mild  left central  7th and tongue was midline.  He wiggled his fingers.  He had a mild left  hemiparesis in the 3-4/5 range.  He had a stereognosis in his left hand, a  good primary sensation.  He had flexor plantar responses.   LABORATORY DATA:  EKG:  With normal sinus rhythm and an occasional PVC, left  atrial enlargement and anterior infarction, age indeterminate.  Telemetry  showed sinus rhythm.  The chest x-ray showed no active disease.  Head CT  showed no parenchymal lesions present, hyperdense right middle cerebral  artery.  Repeat CT showed right-side infarcts with associated mild mass-  affect but without parenchymal hemorrhage, anterior right ventricular  nucleus infarcts appeared minimally dense.  Cranial CT 03/13/2003, showed  continued evolution of right middle cerebral artery distribution stroke with  involvement of the right frontal lobe, right basal ganglia and minimally,  the right parietal lobe.  No evidence of associated hemorrhage or hematoma,  mild left maxillary, left frontal and bilateral ethmoid sinusitis.   Initial sodium 139, potassium 4.2, chloride 109, CO2 content 25, glucose  121, BUN 19, creatinine 1.0.  Liver function tests normal.  Initial white  blood cell count  7800, hemoglobin 13.3, hematocrit 39.3, platelet count  232K.  Differential was 54% polys, 30% lymphs, 14% monocytes which was high.  He had 2% eosinophils, 1% basophils.  Initial pro time 14.0, INR 1.1, PTT  33.  Arterial blood gases 03/12/03, on ventilator:  pH 7401, pCO2 32.9, pO2  257.0, bicarbonate 21.6.  Repeat blood studies in the hospital,  unremarkable.  The last, 10/05/2003, was sodium 141, potassium 4.1, chloride  110, CO2 25 and glucose 105.  BUN 12, creatinine 1.0.  Total bilirubin 0.5,  alkaline phosphatase 24, total protein 5.5, albumin 2.7, otherwise, liver  function tests normal.  Cholesterol, 03/12/2003, 152, triglycerides 105, HDL  44, LDL 87, BL DL 21, homocysteine level 11.46.  Urinalysis unremarkable.  Blood type A positive.  Antibody screen, negative.   Arteriogram by Dr. Estanislado Pandy on an emergent basis, 3/30, showing complete  occlusion of the right ICA at the bulb with partial distal reconstitution of  the right ICA.  There is large filling defect noted in the right MCA, M1  branch with only the anterior temporal branch opacified.  There was minimal  right parietal, left meningeal collateral filling.  The patient underwent  balloon angioplasty procedure on an emergent basis at the site of the right  ICA occlusion using a 2 cm x 4 mm balloon, followed by the placement of a 4  cm x 8 mm control Smart stent with excellent revascularization and infusion  of 9 mg of TPA at the M1 segment with excellent revascularization and distal  parietal branch remained occluded.   HOSPITAL COURSE:  The patient was admitted on an emergent basis, underwent  emergent arteriography showing right ICA occlusion and underwent angioplasty  to the occlusion, stent placement and TPA as above with good  revascularization.  CT scan showed some evolution of stroke without hemorrhage.  The patient was seen by Critical Care Medicine and maintained  on a ventilator 03/12/2003, by the Critical Care  Team.  His hemoglobin  remained stable in the hospital despite recent polypectomy x2.  He was seen  by [facility] Cardiology, Dr. Harrington Challenger, who recommended 2D echocardiogram which is  to be performed as an outpatient, the use of statin medications, and Voltex  despite normal studies at this time.  Doppler study of the carotids  following  the angioplasty procedure on 03/12/2003, showed mild heterogenous  plaque, common carotid arteries showed 60-80% right ICA stenosis in the low  to mid range with patent stent noted in the ICA, showed 60-80% left ICA  stenosis in the left to mid range.  There was bilateral ECA stenosis and the  vertebral flow was antegrade.   The patient was maintained on IV fluids in the hospital.  He would receive  boluses of saline if his blood pressure dropped or systolics below 123456.  His  antihypertensives were discontinued.  His telemetry showed sinus rhythms.  He did extremely well in terms of his left-sided weakness, showing only a  mild 7th, mild left hand drift, decreased 2-point discrimination, some  decreased __________in the left hand and decreased vibration left hand, with  pinprick at times appearing normal.  Visual fields remained normal.  He did  get up and walk in the hospital.  He was seen in consultation by  rehabilitation, who felt he was an outpatient candidate.  He was seen by OT,  PT, and Speech Therapy, who also felt that he was an outpatient candidate.  The patient remained alert and oriented in the hospital.   IMPRESSION:  1. Right brain middle cerebral artery distribution stroke, ischemic type.     Code 434.01.  2. Right ICA occlusion with neurovascular invasive procedure, PTCA stent     placement and TPA on 03/11/2003, now with residual 80% stenosis on the     right and residual 80% stenosis on the left.  Code 433.11.  3. Obesity.  Code 278.01 with resent weight loss of 20 pounds and regain of     6 pounds in 9/03.  4. Recent polypectomy.  Code  578.9.  5. Chronic obstructive pulmonary disease with discontinuation of cigarettes,     12/03.  Code 496.  6. Hypertension.  Code 796.0.   PLAN:  Plan at this time is to discharge the patient today and not to drive  a car.  He is to follow up with Dr. Gaynell Face and Ike Bene in three  weeks for reevaluation.  He will receive a 2D echocardiogram at [facility]  [facility]iology as an outpatient.  He will receive OT, PT, and speech therapy as  an outpatient.  He will receive a CBC to follow up with his polypectomy in  three weeks.  He will receive liver function tests to follow up with new  treatment of Lipitor in six weeks.   DISCHARGE MEDICATIONS:  1. Aspirin 1 p.o. daily, 81 mg or 325 mg.  2. Plavix 75 mg daily.  3. Zantac over-the-counter, b.i.d.  4. Lipitor 10 mg daily.  5. Voltex 1 tablet p.o. daily.  6. Wellbutrin 150 mg SR b.i.d.  He is not to drive a car.  He is discharged on an 1800 calorie diet.  Improved from his pre hospital status.  He is not to return to work until  seen by Dr. Gaynell Face.  He is not being discharged on any antihypertensives  at this time, but may be reconsidered in three weeks on return to Dr.  Gaynell Face.                                               Alyson Locket. Erling Cruz, M.D.    JML/MEDQ  D:  03/16/2003  T:  03/17/2003  Job:  [id number]  cc:   Norris K. Estanislado Pandy, M.D.  [address]  [place]  [place]  [address]  Fax: [phone]   Fay Records, M.D. [facility]   Zella Richer. Burnett Harry, M.D.  [place]  [place] [address]  Fax: [phone]   Princess Bruins. Gaynell Face, M.D.  [address] [place] [address]  Fax: [phone]

## 2011-04-29 NOTE — Op Note (Signed)
[name], [name]                        ACCOUNT NO.:  [id number]   MEDICAL RECORD NO.:  [id number]                   PATIENT TYPE:  AMB   LOCATION:  ENDO                                 FACILITY:  [facility]   PHYSICIAN:  John C. Amedeo Plenty, M.D.                 DATE OF BIRTH:  1946-08-08   DATE OF PROCEDURE:  03/10/2003  DATE OF DISCHARGE:                                 OPERATIVE REPORT   PROCEDURE:  Colonoscopy.   INDICATION FOR PROCEDURE:  Colon cancer screening in a 65 year old patient  with no prior screening.   DESCRIPTION OF PROCEDURE:  The patient was placed in the left lateral  decubitus position and placed on the pulse monitor with continuous low-flow  oxygen delivered by nasal cannula.  He was sedated with 100 mcg IV fentanyl  and 8 mg IV Versed.  The Olympus video colonoscope was inserted into the  rectum and advanced to the cecum, confirmed by transillumination of  McBurney's point and visualization of the ileocecal valve and appendiceal  orifice.  The prep was suboptimal in many areas due to nontransparent liquid  and semisolid stool as well as a lot of spasm, and I could not rule out  lesions less than 1 cm in all areas.  Otherwise the cecum and ascending  colon appeared normal.  Within the transverse colon there was a 1 cm sessile  polyp removed by snare.  There were two approximately 8 mm polyps in the  descending and sigmoid colon that were fulgurated by hot biopsy.  Again  there was a lot of spasm and a lot of liquid nontransparent stool that I  could not completely lavage away, and I could not rule out small lesions in  the left colon and on one or two occasions there was a small polyp or two  approximately 6 mm in diameter that I thought I saw but could not relocate.  No diverticula were noted.  The rectum appeared normal, and retroflex view  of the anus revealed no obvious internal hemorrhoids.  The colonoscope was  then withdrawn and the patient returned to  the recovery room in stable  condition.  He tolerated the procedure well, and there were no immediate  complications.   IMPRESSION:  Three small colon polyps with possible other smaller polyps in  the left colon, with suboptimal prep.   PLAN:  Await histology, and will probably recommend a repeat colonoscopy in  one to two years with CoLyte prep.                                                John C. Amedeo Plenty, M.D.    JCH/MEDQ  D:  03/10/2003  T:  03/10/2003  Job:  [id number]   cc:   Shanon Brow  Carron Brazen, M.D.  [place]  [place] [address]  Fax: [phone]

## 2011-04-29 NOTE — Letter (Signed)
November 10, 2008     RE:  TATEN, JAFFEE  MRN:  [id number]  /  DOB:  [birth year]   To whom it may concern:   This is a letter regarding Selestino Grainer 1946/04/19).  He is a  patient I have followed for several years in Cardiology Clinic.  He has  history of a stroke, hypertension, mitral regurgitation, and  hyperlipidemia.  I am following him on medical therapy.  He has  undergone carotid stenting in the past at the time of his stroke.   Overall, the patient takes activities as tolerated at his own pace.  We  have discussed his recreational activities including use of RV.  With  his medical issues, I would not recommend that he continue to do this.  Again, I think, his activity should be as tolerated and this may in deed  be too much for him.  With this, I think he should not continue on in  his membership to the RV campground again because he cannot fully  participate.   If you have any questions, please feel free to contact me at [phone].    Sincerely,      Fay Records, MD, [facility]  Electronically Signed    PVR/MedQ  DD: 11/10/2008  DT: 11/11/2008  Job #: [id number]

## 2011-04-29 NOTE — Assessment & Plan Note (Signed)
[place]                            CARDIOLOGY OFFICE NOTE   NAME:Schillo, ZEANDRE ZELINSKI                     MRN:          [id number]  DATE:01/29/2007                            DOB:          1946/05/09    IDENTIFICATION:  [name] is a 65 year old gentleman with a history  of a CVA (status post PTA to the right carotid).  I last saw him in  clinic back in May.   In the interval, he denies any TIA-like symptoms.  His breathing has  been okay.  He denies chest pain.  He is not very active, waiting for  the weather to warm.   CURRENT MEDICATIONS:  1. Foltx daily.  2. Ranitidine 75 b.i.d.  3. Aspirin 81 mg daily.  4. Multivitamin daily.  5. Citalopram 20 daily.  6. Budeprion SR 150 b.i.d.  7. Advicor 20/500 two nightly.  8. Metoprolol 50 daily (ER).  9. Lisinopril 10.   PHYSICAL EXAMINATION:  Patient is in no distress.  Blood pressure is 121/77.  Pulse is 53 and regular.  Weight 264 at home.  LUNGS:  Clear.  NECK:  JVP is normal.  CARDIAC:  Regular rate and rhythm.  S1 and S2.  Grade 2/6 systolic  murmur left sternal border.  ABDOMEN:  Benign.  EXTREMITIES:  No edema.   Echocardiogram:  Normal LV function.  EF of  60%.  Diastolic  dysfunction.  Mild to moderate MR.  Mild to moderate AI.  No significant  change.   Carotid Dopplers stable.  Zero to 39% right ICA stenosis.  Stent is  patent, 60% to 79% left ICA stenosis, stable.   Abdominal aorta.  Normal caliber without evidence of focal dilatation,  mild.  Atherosclerosis, mild.  Ileac stenosis.   IMPRESSION:  1. Cerebral vascular disease.  Remains stable.  Continue on current      regimen.  2. Dyslipidemia.  Will need to follow up with a fasting lipid panel.      This can be done locally.  3. Hypertension.  Adequate control.  4. Valvular disease.  Remains stable.  Would continue to follow.   I will set followup in about 10 months' time.  Encouraged the patient to  increase his  physical activity, and try to pull his weight down.     Fay Records, MD, [facility]  Electronically Signed    PVR/MedQ  DD: 01/29/2007  DT: 01/30/2007  Job #: [id number]

## 2011-06-27 ENCOUNTER — Telehealth: Payer: Self-pay | Admitting: Internal Medicine

## 2011-06-27 DIAGNOSIS — I6529 Occlusion and stenosis of unspecified carotid artery: Secondary | ICD-10-CM

## 2011-06-27 DIAGNOSIS — E782 Mixed hyperlipidemia: Secondary | ICD-10-CM

## 2011-06-27 NOTE — Telephone Encounter (Signed)
Patient should have fasting lipid panel Also, he should be on call backs for carotid USN per routine

## 2011-06-27 NOTE — Telephone Encounter (Signed)
See note already done.

## 2011-06-27 NOTE — Telephone Encounter (Signed)
LMOM that patient needs fasting lipid panel and carotids prior to visit with Dr.Ross. Will send order to [facility].

## 2011-06-27 NOTE — Telephone Encounter (Signed)
Will ask Dr.Ross and call back. 

## 2011-06-27 NOTE — Telephone Encounter (Signed)
Per pt wife calling. Pt scheduled appt for December 3rd, pt wife would like to know if pt needs any test work done prior to pt appt. Please return call to advise.

## 2011-06-27 NOTE — Telephone Encounter (Signed)
Addended by: Tyrone Nine on: 06/27/2011 06:49 PM   Modules accepted: Orders

## 2011-10-24 ENCOUNTER — Encounter: Payer: Self-pay | Admitting: Internal Medicine

## 2011-10-26 ENCOUNTER — Other Ambulatory Visit: Payer: Self-pay | Admitting: Internal Medicine

## 2011-10-26 DIAGNOSIS — R0989 Other specified symptoms and signs involving the circulatory and respiratory systems: Secondary | ICD-10-CM

## 2011-10-28 ENCOUNTER — Other Ambulatory Visit: Payer: Self-pay | Admitting: *Deleted

## 2011-10-28 ENCOUNTER — Encounter (INDEPENDENT_AMBULATORY_CARE_PROVIDER_SITE_OTHER): Payer: Medicare Other | Admitting: *Deleted

## 2011-10-28 DIAGNOSIS — R0989 Other specified symptoms and signs involving the circulatory and respiratory systems: Secondary | ICD-10-CM

## 2011-10-28 DIAGNOSIS — I6529 Occlusion and stenosis of unspecified carotid artery: Secondary | ICD-10-CM

## 2011-11-11 ENCOUNTER — Encounter: Payer: Self-pay | Admitting: Internal Medicine

## 2011-11-14 ENCOUNTER — Encounter: Payer: Self-pay | Admitting: Internal Medicine

## 2011-11-14 ENCOUNTER — Ambulatory Visit (INDEPENDENT_AMBULATORY_CARE_PROVIDER_SITE_OTHER): Payer: Medicare Other | Admitting: Internal Medicine

## 2011-11-14 DIAGNOSIS — E785 Hyperlipidemia, unspecified: Secondary | ICD-10-CM

## 2011-11-14 DIAGNOSIS — I38 Endocarditis, valve unspecified: Secondary | ICD-10-CM

## 2011-11-14 DIAGNOSIS — I679 Cerebrovascular disease, unspecified: Secondary | ICD-10-CM

## 2011-11-14 DIAGNOSIS — I10 Essential (primary) hypertension: Secondary | ICD-10-CM

## 2011-11-14 NOTE — Assessment & Plan Note (Signed)
Patient took a cold medicine this am  Said his BP usually is good  Told him to monitor and call if high.

## 2011-11-14 NOTE — Progress Notes (Signed)
HPI  Patient is a 65 year old with history of CV disease(s/p PTA of R carotid in 2000), dyslipidemia, mild  MR and HTN.  Last in clinic in December 2011. Recent lipids in the summer LDL was 64.   Carotid USN last week showed mild CV disease in R ICA. Breathing good.  NO CP.  No TIA like symptoms Rides stationary bike 3x per wk 30 to 45 min.   10 miles at a time. Walking. Took a cold medicine this AM.  Usually good.  Allergies  Allergen Reactions  . Bee Venom     Current Outpatient Prescriptions  Medication Sig Dispense Refill  . ADVICOR 500-20 MG 24 hr tablet Take 2 tablets by mouth at bedtime.       [name] aspirin EC 81 MG tablet Take 81 mg by mouth daily.        [name] buPROPion (WELLBUTRIN SR) 150 MG 12 hr tablet Take 150 mg by mouth 2 (two) times daily.       . citalopram (CELEXA) 20 MG tablet Take 20 mg by mouth daily.       [name] lisinopril (PRINIVIL,ZESTRIL) 10 MG tablet Take 10 mg by mouth daily.       . metoprolol (TOPROL-XL) 50 MG 24 hr tablet Take 50 mg by mouth daily.       . ranitidine (ZANTAC) 75 MG tablet Take 75 mg by mouth 2 (two) times daily.          Past Medical History  Diagnosis Date  . Chronic airway obstruction, not elsewhere classified   . Hemiplegia affecting unspecified side, late effect of cerebrovascular disease   . Cerebrovascular disease, unspecified   . Mitral valve insufficiency and aortic valve insufficiency   . Aortic insufficiency   . Valvular heart disease   . Dyslipidemia   . Cardiovascular disease   . Hypertension     Past Surgical History  Procedure Date  . Foot surgery   . Back surgery   . Polypectomy     No family history on file.  History   Social History  . Marital Status: Married    Spouse Name: N/A    Number of Children: N/A  . Years of Education: N/A   Occupational History  . Not on file.   Social History Main Topics  . Smoking status: Former Smoker    Types: Cigarettes    Quit date: 07/15/2011  . Smokeless tobacco: Never  Used  . Alcohol Use: No  . Drug Use: No  . Sexually Active: Not on file   Other Topics Concern  . Not on file   Social History Narrative  . No narrative on file    Review of Systems:  All systems reviewed.  They are negative to the above problem except as previously stated.  Vital Signs: BP 164/73  Pulse 53  Ht 6' (1.829 m)  Wt 270 lb 12.8 oz (122.834 kg)  BMI 36.73 kg/m2  Physical Exam Patient denies CP, SOB  HEENT:  Normocephalic, atraumatic. EOMI, PERRLA.  Neck: JVP is normal. No thyromegaly. No bruits.  Lungs: clear to auscultation. No rales no wheezes.  Heart: Regular rate and rhythm. Normal S1, S2. No S3.   No significant murmurs. PMI not displaced.  Abdomen: Obese Supple, nontender. Normal bowel sounds.  No hepatomegaly.  Extremities:   Good distal pulses throughout. No lower extremity edema.  Musculoskeletal :moving all extremities.  Neuro:   alert and oriented x3.  CN II-XII grossly intact.  EKG:  Sinus bradycardia.  53 bpm.  Occasional PAC>  Assessment and Plan:

## 2011-11-14 NOTE — Assessment & Plan Note (Signed)
Good control on labs last summer

## 2011-11-14 NOTE — Assessment & Plan Note (Signed)
Recent carotid USN  Is good.  F/U periodically.  Keep on statin  Watch BP.

## 2011-11-14 NOTE — Assessment & Plan Note (Signed)
Mild MR.  Mod AI  FOllow clincally

## 2012-01-12 ENCOUNTER — Other Ambulatory Visit: Payer: Self-pay | Admitting: *Deleted

## 2012-01-12 MED ORDER — NIACIN-LOVASTATIN ER 500-20 MG PO TB24: 2.0000 | ORAL_TABLET | Freq: Every day | ORAL | Status: DC

## 2012-01-12 NOTE — Telephone Encounter (Signed)
RX SENT

## 2012-05-31 ENCOUNTER — Encounter: Payer: Self-pay | Admitting: Internal Medicine

## 2012-05-31 DIAGNOSIS — Z125 Encounter for screening for malignant neoplasm of prostate: Secondary | ICD-10-CM | POA: Diagnosis not present

## 2012-05-31 DIAGNOSIS — F329 Major depressive disorder, single episode, unspecified: Secondary | ICD-10-CM | POA: Diagnosis not present

## 2012-05-31 DIAGNOSIS — R7301 Impaired fasting glucose: Secondary | ICD-10-CM | POA: Diagnosis not present

## 2012-05-31 DIAGNOSIS — Z79899 Other long term (current) drug therapy: Secondary | ICD-10-CM | POA: Diagnosis not present

## 2012-05-31 DIAGNOSIS — I679 Cerebrovascular disease, unspecified: Secondary | ICD-10-CM | POA: Diagnosis not present

## 2012-05-31 DIAGNOSIS — I10 Essential (primary) hypertension: Secondary | ICD-10-CM | POA: Diagnosis not present

## 2012-05-31 DIAGNOSIS — E7800 Pure hypercholesterolemia, unspecified: Secondary | ICD-10-CM | POA: Diagnosis not present

## 2012-05-31 DIAGNOSIS — E669 Obesity, unspecified: Secondary | ICD-10-CM | POA: Diagnosis not present

## 2012-05-31 DIAGNOSIS — Z0000 Encounter for general adult medical examination without abnormal findings: Secondary | ICD-10-CM | POA: Diagnosis not present

## 2012-06-01 ENCOUNTER — Encounter: Payer: Self-pay | Admitting: Internal Medicine

## 2012-06-11 ENCOUNTER — Telehealth: Payer: Self-pay | Admitting: *Deleted

## 2012-06-11 NOTE — Telephone Encounter (Signed)
LMOM with the following information: Dr.Ross reviewed labs that were sent from 05/31/2012 from [place]. She advised that Lipid panel was good and to stay on the same medications.

## 2012-06-28 DIAGNOSIS — Z86010 Personal history of colonic polyps: Secondary | ICD-10-CM | POA: Diagnosis not present

## 2012-06-28 DIAGNOSIS — K5730 Diverticulosis of large intestine without perforation or abscess without bleeding: Secondary | ICD-10-CM | POA: Diagnosis not present

## 2012-06-28 DIAGNOSIS — Z09 Encounter for follow-up examination after completed treatment for conditions other than malignant neoplasm: Secondary | ICD-10-CM | POA: Diagnosis not present

## 2012-07-24 ENCOUNTER — Other Ambulatory Visit: Payer: Self-pay | Admitting: *Deleted

## 2012-07-24 MED ORDER — LISINOPRIL 10 MG PO TABS: 10.0000 mg | ORAL_TABLET | Freq: Every day | ORAL | Status: DC

## 2012-09-21 DIAGNOSIS — Z23 Encounter for immunization: Secondary | ICD-10-CM | POA: Diagnosis not present

## 2012-11-05 ENCOUNTER — Encounter (INDEPENDENT_AMBULATORY_CARE_PROVIDER_SITE_OTHER): Payer: Medicare Other

## 2012-11-05 ENCOUNTER — Other Ambulatory Visit: Payer: Self-pay | Admitting: Cardiology

## 2012-11-05 DIAGNOSIS — I6529 Occlusion and stenosis of unspecified carotid artery: Secondary | ICD-10-CM | POA: Diagnosis not present

## 2012-11-09 ENCOUNTER — Telehealth: Payer: Self-pay | Admitting: *Deleted

## 2012-11-09 NOTE — Telephone Encounter (Signed)
Message copied by Laurine Blazer on Fri Nov 09, 2012 11:22 AM ------      Message from: [place], [place] V      Created: Fri Nov 09, 2012  6:13 AM       Stent in R carotid is good      There is mild increase in plaque in L carotid which is moderate.      Need f/u in 1 year      He has not had lipids that I can see in over 1 year.  Has someone checked them.  May need to get tighter control of lipids      If he has not had please arrange for fasting.

## 2012-11-09 NOTE — Telephone Encounter (Signed)
Notes Recorded by Laurine Blazer, LPN on [id number] at 11:21 AM Patient notified. Patient has recently had labs done by [facility]) in June. (see lab section in chart) Already has follow up with Dr. Harrington Challenger for 01/07/2013.

## 2012-12-04 ENCOUNTER — Encounter: Payer: Self-pay | Admitting: Internal Medicine

## 2012-12-04 DIAGNOSIS — Z79899 Other long term (current) drug therapy: Secondary | ICD-10-CM | POA: Diagnosis not present

## 2012-12-04 DIAGNOSIS — I10 Essential (primary) hypertension: Secondary | ICD-10-CM | POA: Diagnosis not present

## 2012-12-04 DIAGNOSIS — M238X9 Other internal derangements of unspecified knee: Secondary | ICD-10-CM | POA: Diagnosis not present

## 2012-12-04 DIAGNOSIS — I679 Cerebrovascular disease, unspecified: Secondary | ICD-10-CM | POA: Diagnosis not present

## 2012-12-04 DIAGNOSIS — E7800 Pure hypercholesterolemia, unspecified: Secondary | ICD-10-CM | POA: Diagnosis not present

## 2012-12-04 DIAGNOSIS — F329 Major depressive disorder, single episode, unspecified: Secondary | ICD-10-CM | POA: Diagnosis not present

## 2012-12-04 DIAGNOSIS — R7309 Other abnormal glucose: Secondary | ICD-10-CM | POA: Diagnosis not present

## 2012-12-07 ENCOUNTER — Telehealth: Payer: Self-pay | Admitting: Internal Medicine

## 2012-12-07 NOTE — Telephone Encounter (Signed)
plz return call to [facility]- Dr. Audie Clear Office  [phone], regarding pt medication change.

## 2012-12-07 NOTE — Telephone Encounter (Signed)
N/A.  LMTC. 

## 2012-12-10 NOTE — Telephone Encounter (Signed)
Left message for call back with Olivia Mackie.

## 2012-12-13 NOTE — Telephone Encounter (Signed)
Brandon [name] called to say that Dr.Westerman has taken patient off [place] and just has him taking Lovastatin. They will check his labs again in 6 to 8 weeks. Will let Dr.Ross know.

## 2012-12-14 ENCOUNTER — Other Ambulatory Visit: Payer: Self-pay | Admitting: *Deleted

## 2012-12-14 DIAGNOSIS — E7800 Pure hypercholesterolemia, unspecified: Secondary | ICD-10-CM

## 2012-12-14 MED ORDER — NIACIN-LOVASTATIN ER 500-20 MG PO TB24: ORAL_TABLET | ORAL | Status: DC

## 2012-12-14 NOTE — Telephone Encounter (Signed)
Called patient and his wife and advised that for now he needs to stay on [place] and come in for a fasting Lipomed panel prior to any potential medication change per Dr.Ross. He will come in on 1/7 for a lipomed panel.  Left message with Tracy/Dr.Westerman with above information.

## 2012-12-18 ENCOUNTER — Other Ambulatory Visit: Payer: Medicare Other

## 2012-12-21 ENCOUNTER — Other Ambulatory Visit: Payer: Medicare Other

## 2012-12-27 ENCOUNTER — Other Ambulatory Visit (INDEPENDENT_AMBULATORY_CARE_PROVIDER_SITE_OTHER): Payer: Medicare Other

## 2012-12-27 DIAGNOSIS — E7800 Pure hypercholesterolemia, unspecified: Secondary | ICD-10-CM

## 2012-12-28 LAB — NMR LIPOPROFILE WITH LIPIDS
HDL Particle Number: 34.7 umol/L (ref >=30.5)
LDL (calc): 57 mg/dL (ref <100)
LDL Particle Number: 692 nmol/L (ref <1000)
LDL Size: 20.1 nm — ABNORMAL LOW (ref >20.5)
LP-IR Score: 71 — ABNORMAL HIGH (ref <=45)
Small LDL Particle Number: 443 nmol/L (ref <=527)

## 2013-01-07 ENCOUNTER — Ambulatory Visit (INDEPENDENT_AMBULATORY_CARE_PROVIDER_SITE_OTHER): Payer: Medicare Other | Admitting: Internal Medicine

## 2013-01-07 VITALS — BP 128/61 | HR 55 | Ht 72.0 in | Wt 275.0 lb

## 2013-01-07 DIAGNOSIS — I359 Nonrheumatic aortic valve disorder, unspecified: Secondary | ICD-10-CM

## 2013-01-07 DIAGNOSIS — I351 Nonrheumatic aortic (valve) insufficiency: Secondary | ICD-10-CM

## 2013-01-07 NOTE — Progress Notes (Addendum)
HPI Patient is a 67 yo with a history of CV disease (s/p PTA of  R carotid in 2000), HL , HTN  I saw him in Dec 2012.   SInce seen he has done well.  Denies CP No SOB  No TIA like symptoms.  Admits to overeating   Knows he needs to do better Allergies  Allergen Reactions  . Bee Venom     Current Outpatient Prescriptions  Medication Sig Dispense Refill  . aspirin EC 81 MG tablet Take 81 mg by mouth daily.        [name] buPROPion (WELLBUTRIN SR) 150 MG 12 hr tablet Take 150 mg by mouth 2 (two) times daily.       . citalopram (CELEXA) 20 MG tablet Take 20 mg by mouth daily.       [name] lisinopril (PRINIVIL,ZESTRIL) 10 MG tablet Take 1 tablet (10 mg total) by mouth daily.  90 tablet  3  . metoprolol (TOPROL-XL) 50 MG 24 hr tablet Take 50 mg by mouth daily.       . niacin-lovastatin (ADVICOR) 500-20 MG 24 hr tablet Take 2 tabs at bedtime      . ranitidine (ZANTAC) 75 MG tablet Take 75 mg by mouth 2 (two) times daily.          Past Medical History  Diagnosis Date  . Chronic airway obstruction, not elsewhere classified   . Hemiplegia affecting unspecified side, late effect of cerebrovascular disease   . Cerebrovascular disease, unspecified   . Mitral valve insufficiency and aortic valve insufficiency   . Aortic insufficiency   . Valvular heart disease   . Dyslipidemia   . Cardiovascular disease   . Hypertension     Past Surgical History  Procedure Date  . Foot surgery   . Back surgery   . Polypectomy     No family history on file.  History   Social History  . Marital Status: Married    Spouse Name: N/A    Number of Children: N/A  . Years of Education: N/A   Occupational History  . Not on file.   Social History Main Topics  . Smoking status: Former Smoker    Types: Cigarettes    Quit date: 07/15/2011  . Smokeless tobacco: Never Used  . Alcohol Use: No  . Drug Use: No  . Sexually Active: Not on file   Other Topics Concern  . Not on file   Social History Narrative  . No  narrative on file    Review of Systems:  All systems reviewed.  They are negative to the above problem except as previously stated.  Vital Signs: BP 128/61  Pulse 55  Ht 6' (1.829 m)  Wt 275 lb (124.739 kg)  BMI 37.30 kg/m2  Physical Exam Morbidly obese 67 yo in NAD HEENT:  Normocephalic, atraumatic. EOMI, PERRLA.  Neck: JVP is normal.  No bruits.  Lungs: clear to auscultation. No rales no wheezes.  Heart: Regular rate and rhythm. Normal S1, S2. No S3.   No significant murmurs. PMI not displaced.  Abdomen:  Supple, nontender. Normal bowel sounds. No masses. No hepatomegaly.  Extremities:   Good distal pulses throughout. No lower extremity edema.  Musculoskeletal :moving all extremities.  Neuro:   alert and oriented x3.  CN II-XII grossly intact.  EKG  Sinus bradycardia52 bpm.   Assessment and Plan:  1.  CV disease  Carotid USN in Nov there was mild increase in plaquing in L carotid which is  moderate.   Follow.  2.  HL  Recent lipomed is very good . I would keep on same regimen with advicor.  3.  Obesity  Encouraged him to watch calories.  Increase activity.    4.  HTN  Adequate control  HR is a little slow  He can cut toprol in 1/2 and see how feels.  Bartholome Bill been in this range in past.

## 2013-01-07 NOTE — Patient Instructions (Addendum)
Your physician has requested that you have an echocardiogram. Echocardiography is a painless test that uses sound waves to create images of your heart. It provides your doctor with information about the size and shape of your heart and how well your heart's chambers and valves are working. This procedure takes approximately one hour. There are no restrictions for this procedure.  Your physician wants you to follow-up in: 1 year with Dr. Harrington Challenger. You will receive a reminder letter in the mail two months in advance. If you don't receive a letter, please call our office to schedule the follow-up appointment.

## 2013-01-11 ENCOUNTER — Ambulatory Visit (HOSPITAL_COMMUNITY): Payer: Medicare Other | Attending: Cardiology | Admitting: Radiology

## 2013-01-11 DIAGNOSIS — I359 Nonrheumatic aortic valve disorder, unspecified: Secondary | ICD-10-CM | POA: Diagnosis not present

## 2013-01-11 DIAGNOSIS — J449 Chronic obstructive pulmonary disease, unspecified: Secondary | ICD-10-CM | POA: Diagnosis not present

## 2013-01-11 DIAGNOSIS — I080 Rheumatic disorders of both mitral and aortic valves: Secondary | ICD-10-CM | POA: Diagnosis not present

## 2013-01-11 DIAGNOSIS — J4489 Other specified chronic obstructive pulmonary disease: Secondary | ICD-10-CM | POA: Insufficient documentation

## 2013-01-11 DIAGNOSIS — E669 Obesity, unspecified: Secondary | ICD-10-CM | POA: Insufficient documentation

## 2013-01-11 DIAGNOSIS — E785 Hyperlipidemia, unspecified: Secondary | ICD-10-CM | POA: Diagnosis not present

## 2013-01-11 DIAGNOSIS — I10 Essential (primary) hypertension: Secondary | ICD-10-CM | POA: Insufficient documentation

## 2013-01-11 DIAGNOSIS — I351 Nonrheumatic aortic (valve) insufficiency: Secondary | ICD-10-CM

## 2013-01-11 NOTE — Progress Notes (Signed)
Echocardiogram performed.  

## 2013-01-15 ENCOUNTER — Telehealth: Payer: Self-pay | Admitting: Internal Medicine

## 2013-01-15 NOTE — Telephone Encounter (Signed)
Will forward to Dr. Harrington Challenger.  It appears this Echo was not routed back top Dr. Harrington Challenger for review once resulted.  Pt is calling for results.

## 2013-01-15 NOTE — Telephone Encounter (Signed)
Aortic stenosis appears mild  No new recommendations.  Will follow periodically

## 2013-01-15 NOTE — Telephone Encounter (Signed)
New Problem     Following up on echo done 1/31. Calling to find out about results.

## 2013-01-16 ENCOUNTER — Telehealth: Payer: Self-pay

## 2013-01-16 NOTE — Telephone Encounter (Signed)
Pt.notified

## 2013-01-26 ENCOUNTER — Other Ambulatory Visit: Payer: Self-pay

## 2013-02-15 ENCOUNTER — Other Ambulatory Visit: Payer: Self-pay | Admitting: Internal Medicine

## 2013-05-29 ENCOUNTER — Inpatient Hospital Stay (HOSPITAL_COMMUNITY)
Admission: EM | Admit: 2013-05-29 | Discharge: 2013-06-02 | DRG: 418 | Disposition: A | Discharge status: Home / Self Care | Payer: Medicare Other | Attending: Surgery | Admitting: Surgery

## 2013-05-29 ENCOUNTER — Encounter (HOSPITAL_COMMUNITY): Payer: Self-pay | Admitting: Emergency Medicine

## 2013-05-29 ENCOUNTER — Emergency Department (HOSPITAL_COMMUNITY): Payer: Medicare Other

## 2013-05-29 DIAGNOSIS — I69959 Hemiplegia and hemiparesis following unspecified cerebrovascular disease affecting unspecified side: Secondary | ICD-10-CM | POA: Diagnosis not present

## 2013-05-29 DIAGNOSIS — R7402 Elevation of levels of lactic acid dehydrogenase (LDH): Secondary | ICD-10-CM | POA: Diagnosis present

## 2013-05-29 DIAGNOSIS — K8066 Calculus of gallbladder and bile duct with acute and chronic cholecystitis without obstruction: Secondary | ICD-10-CM | POA: Diagnosis not present

## 2013-05-29 DIAGNOSIS — K8050 Calculus of bile duct without cholangitis or cholecystitis without obstruction: Secondary | ICD-10-CM | POA: Diagnosis present

## 2013-05-29 DIAGNOSIS — F17200 Nicotine dependence, unspecified, uncomplicated: Secondary | ICD-10-CM | POA: Diagnosis present

## 2013-05-29 DIAGNOSIS — I359 Nonrheumatic aortic valve disorder, unspecified: Secondary | ICD-10-CM

## 2013-05-29 DIAGNOSIS — E669 Obesity, unspecified: Secondary | ICD-10-CM | POA: Insufficient documentation

## 2013-05-29 DIAGNOSIS — Z01810 Encounter for preprocedural cardiovascular examination: Secondary | ICD-10-CM | POA: Diagnosis not present

## 2013-05-29 DIAGNOSIS — K8010 Calculus of gallbladder with chronic cholecystitis without obstruction: Secondary | ICD-10-CM | POA: Diagnosis not present

## 2013-05-29 DIAGNOSIS — R7401 Elevation of levels of liver transaminase levels: Secondary | ICD-10-CM | POA: Diagnosis present

## 2013-05-29 DIAGNOSIS — R197 Diarrhea, unspecified: Secondary | ICD-10-CM | POA: Diagnosis not present

## 2013-05-29 DIAGNOSIS — R112 Nausea with vomiting, unspecified: Secondary | ICD-10-CM | POA: Diagnosis not present

## 2013-05-29 DIAGNOSIS — R1011 Right upper quadrant pain: Secondary | ICD-10-CM | POA: Diagnosis not present

## 2013-05-29 DIAGNOSIS — N200 Calculus of kidney: Secondary | ICD-10-CM | POA: Diagnosis not present

## 2013-05-29 DIAGNOSIS — K810 Acute cholecystitis: Secondary | ICD-10-CM

## 2013-05-29 DIAGNOSIS — R932 Abnormal findings on diagnostic imaging of liver and biliary tract: Secondary | ICD-10-CM | POA: Diagnosis present

## 2013-05-29 DIAGNOSIS — I080 Rheumatic disorders of both mitral and aortic valves: Secondary | ICD-10-CM | POA: Diagnosis not present

## 2013-05-29 DIAGNOSIS — I679 Cerebrovascular disease, unspecified: Secondary | ICD-10-CM

## 2013-05-29 DIAGNOSIS — I10 Essential (primary) hypertension: Secondary | ICD-10-CM | POA: Diagnosis not present

## 2013-05-29 DIAGNOSIS — K8040 Calculus of bile duct with cholecystitis, unspecified, without obstruction: Secondary | ICD-10-CM | POA: Diagnosis not present

## 2013-05-29 DIAGNOSIS — R1013 Epigastric pain: Secondary | ICD-10-CM | POA: Diagnosis not present

## 2013-05-29 DIAGNOSIS — K812 Acute cholecystitis with chronic cholecystitis: Secondary | ICD-10-CM | POA: Diagnosis present

## 2013-05-29 DIAGNOSIS — R1084 Generalized abdominal pain: Secondary | ICD-10-CM | POA: Diagnosis not present

## 2013-05-29 DIAGNOSIS — K8020 Calculus of gallbladder without cholecystitis without obstruction: Secondary | ICD-10-CM | POA: Diagnosis not present

## 2013-05-29 DIAGNOSIS — H9190 Unspecified hearing loss, unspecified ear: Secondary | ICD-10-CM

## 2013-05-29 HISTORY — DX: Cerebral infarction, unspecified: I63.9

## 2013-05-29 HISTORY — DX: Occlusion and stenosis of unspecified carotid artery: I65.29

## 2013-05-29 LAB — POCT I-STAT, CHEM 8
BUN: 26 mg/dL — ABNORMAL HIGH (ref 6–23)
Chloride: 107 mEq/L (ref 96–112)
Glucose, Bld: 152 mg/dL — ABNORMAL HIGH (ref 70–99)
HCT: 44 % (ref 39.0–52.0)
Potassium: 3.7 mEq/L (ref 3.5–5.1)

## 2013-05-29 LAB — CBC WITH DIFFERENTIAL/PLATELET
HCT: 41.5 % (ref 39.0–52.0)
Hemoglobin: 14.6 g/dL (ref 13.0–17.0)
Lymphs Abs: 1.3 10*3/uL (ref 0.7–4.0)
Monocytes Absolute: 1.4 10*3/uL — ABNORMAL HIGH (ref 0.1–1.0)
Monocytes Relative: 9 % (ref 3–12)
Neutro Abs: 13.3 10*3/uL — ABNORMAL HIGH (ref 1.7–7.7)
Neutrophils Relative %: 83 % — ABNORMAL HIGH (ref 43–77)
RBC: 4.52 MIL/uL (ref 4.22–5.81)

## 2013-05-29 LAB — HEPATIC FUNCTION PANEL
ALT: 606 U/L — ABNORMAL HIGH (ref 0–53)
AST: 401 U/L — ABNORMAL HIGH (ref 0–37)
Albumin: 3.7 g/dL (ref 3.5–5.2)
Alkaline Phosphatase: 265 U/L — ABNORMAL HIGH (ref 39–117)
Total Bilirubin: 7.5 mg/dL — ABNORMAL HIGH (ref 0.3–1.2)

## 2013-05-29 LAB — URINALYSIS, ROUTINE W REFLEX MICROSCOPIC
Glucose, UA: NEGATIVE mg/dL
Hgb urine dipstick: NEGATIVE
Ketones, ur: 40 mg/dL — AB
pH: 5.5 (ref 5.0–8.0)

## 2013-05-29 LAB — URINE MICROSCOPIC-ADD ON

## 2013-05-29 MED ORDER — ACETAMINOPHEN 650 MG RE SUPP: 650.0000 mg | Freq: Four times a day (QID) | RECTAL | Status: DC | PRN

## 2013-05-29 MED ORDER — PANTOPRAZOLE SODIUM 40 MG IV SOLR
40.0000 mg | Freq: Every day | INTRAVENOUS | Status: DC
  Administered 2013-05-30 – 2013-05-31 (×3): 40 mg via INTRAVENOUS
  Filled 2013-05-29 (×5): qty 40

## 2013-05-29 MED ORDER — ONDANSETRON HCL 4 MG/2ML IJ SOLN
4.0000 mg | Freq: Once | INTRAMUSCULAR | Status: AC
  Administered 2013-05-29: 4 mg via INTRAVENOUS
  Filled 2013-05-29: qty 2

## 2013-05-29 MED ORDER — NIACIN-LOVASTATIN ER 500-20 MG PO TB24: 1.0000 | ORAL_TABLET | Freq: Every day | ORAL | Status: DC

## 2013-05-29 MED ORDER — SODIUM CHLORIDE 0.9 % IV BOLUS (SEPSIS)
1000.0000 mL | Freq: Once | INTRAVENOUS | Status: AC
  Administered 2013-05-29: 1000 mL via INTRAVENOUS

## 2013-05-29 MED ORDER — NIACIN 500 MG PO TABS
500.0000 mg | ORAL_TABLET | Freq: Every day | ORAL | Status: DC
  Administered 2013-05-31 – 2013-06-01 (×2): 500 mg via ORAL
  Filled 2013-05-29 (×5): qty 1

## 2013-05-29 MED ORDER — SODIUM CHLORIDE 0.9 % IV SOLN
3.0000 g | Freq: Once | INTRAVENOUS | Status: AC
  Administered 2013-05-29: 3 g via INTRAVENOUS
  Filled 2013-05-29: qty 3

## 2013-05-29 MED ORDER — MORPHINE SULFATE 4 MG/ML IJ SOLN
4.0000 mg | Freq: Once | INTRAMUSCULAR | Status: AC
  Administered 2013-05-29: 4 mg via INTRAVENOUS
  Filled 2013-05-29: qty 1

## 2013-05-29 MED ORDER — SIMVASTATIN 10 MG PO TABS
10.0000 mg | ORAL_TABLET | Freq: Every day | ORAL | Status: DC
  Administered 2013-05-30: 10 mg via ORAL
  Filled 2013-05-29 (×3): qty 1

## 2013-05-29 MED ORDER — LISINOPRIL 10 MG PO TABS
10.0000 mg | ORAL_TABLET | Freq: Every day | ORAL | Status: DC
  Administered 2013-05-30 – 2013-06-01 (×2): 10 mg via ORAL
  Filled 2013-05-29 (×4): qty 1

## 2013-05-29 MED ORDER — DEXTROSE-NACL 5-0.9 % IV SOLN
INTRAVENOUS | Status: DC
  Administered 2013-05-30 – 2013-05-31 (×5): via INTRAVENOUS

## 2013-05-29 MED ORDER — ENOXAPARIN SODIUM 40 MG/0.4ML ~~LOC~~ SOLN
40.0000 mg | Freq: Every day | SUBCUTANEOUS | Status: DC
  Administered 2013-05-30 (×2): 40 mg via SUBCUTANEOUS
  Filled 2013-05-29 (×4): qty 0.4

## 2013-05-29 MED ORDER — HYDROMORPHONE HCL PF 1 MG/ML IJ SOLN
1.0000 mg | INTRAMUSCULAR | Status: DC | PRN
  Administered 2013-05-31: 1 mg via INTRAVENOUS
  Filled 2013-05-29: qty 1

## 2013-05-29 MED ORDER — ACETAMINOPHEN 325 MG PO TABS: 650.0000 mg | ORAL_TABLET | Freq: Four times a day (QID) | ORAL | Status: DC | PRN

## 2013-05-29 MED ORDER — CIPROFLOXACIN IN D5W 400 MG/200ML IV SOLN
400.0000 mg | Freq: Two times a day (BID) | INTRAVENOUS | Status: DC
  Administered 2013-05-30 – 2013-05-31 (×5): 400 mg via INTRAVENOUS
  Filled 2013-05-29 (×6): qty 200

## 2013-05-29 MED ORDER — ONDANSETRON HCL 4 MG/2ML IJ SOLN
4.0000 mg | Freq: Four times a day (QID) | INTRAMUSCULAR | Status: DC | PRN
  Administered 2013-05-30 – 2013-05-31 (×2): 4 mg via INTRAVENOUS
  Filled 2013-05-29 (×2): qty 2

## 2013-05-29 MED ORDER — METOPROLOL SUCCINATE ER 50 MG PO TB24
50.0000 mg | ORAL_TABLET | Freq: Every day | ORAL | Status: DC
  Administered 2013-05-30 – 2013-06-01 (×2): 50 mg via ORAL
  Filled 2013-05-29 (×4): qty 1

## 2013-05-29 NOTE — ED Notes (Addendum)
Pt c/o n/v/d x 3 days. Pt seen at [facility] PTA, WBC 17.2. No urine output since 1500 today. Pt c/o pain to abd radiating around to back.

## 2013-05-29 NOTE — H&P (Signed)
Brandon [name] is an 67 y.o. male.   Chief Complaint: abdominal pain HPI: patient presents with three-day history of epigastric abdominal pain and right upper quadrant abdominal pain. He is sharp in nature and is progressive and constant. No radiation. Denies chest pain or shortness of breath. Significant nausea and vomiting for 3 days. Poor appetite. Ultrasound was done which showed a thickened gallbladder wall and gallstones with positive Murphy sign consistent with acute cholecystitis.  Past Medical History  Diagnosis Date  . Chronic airway obstruction, not elsewhere classified   . Hemiplegia affecting unspecified side, late effect of cerebrovascular disease   . Cerebrovascular disease, unspecified   . Mitral valve insufficiency and aortic valve insufficiency   . Aortic insufficiency   . Valvular heart disease   . Dyslipidemia   . Cardiovascular disease   . Hypertension   . Stroke   . Coronary artery disease     Past Surgical History  Procedure Laterality Date  . Foot surgery    . Back surgery    . Polypectomy    . Tonsillectomy      History reviewed. No pertinent family history. Social History:  reports that he has been smoking Cigarettes.  He has been smoking about 0.00 packs per day. He has never used smokeless tobacco. He reports that  drinks alcohol. He reports that he does not use illicit drugs.  Allergies:  Allergies  Allergen Reactions  . Bee Venom Anaphylaxis and Swelling     (Not in a hospital admission)  Results for orders placed during the hospital encounter of 05/29/13 (from the past 48 hour(s))  CBC WITH DIFFERENTIAL     Status: Abnormal   Collection Time    05/29/13  8:45 PM      Result Value Range   WBC 16.0 (*) 4.0 - 10.5 K/uL   RBC 4.52  4.22 - 5.81 MIL/uL   Hemoglobin 14.6  13.0 - 17.0 g/dL   HCT 41.5  39.0 - 52.0 %   MCV 91.8  78.0 - 100.0 fL   MCH 32.3  26.0 - 34.0 pg   MCHC 35.2  30.0 - 36.0 g/dL   RDW 13.3  11.5 - 15.5 %   Platelets 254   150 - 400 K/uL   Neutrophils Relative % 83 (*) 43 - 77 %   Neutro Abs 13.3 (*) 1.7 - 7.7 K/uL   Lymphocytes Relative 8 (*) 12 - 46 %   Lymphs Abs 1.3  0.7 - 4.0 K/uL   Monocytes Relative 9  3 - 12 %   Monocytes Absolute 1.4 (*) 0.1 - 1.0 K/uL   Eosinophils Relative 0  0 - 5 %   Eosinophils Absolute 0.0  0.0 - 0.7 K/uL   Basophils Relative 0  0 - 1 %   Basophils Absolute 0.0  0.0 - 0.1 K/uL  HEPATIC FUNCTION PANEL     Status: Abnormal   Collection Time    05/29/13  8:45 PM      Result Value Range   Total Protein 7.9  6.0 - 8.3 g/dL   Albumin 3.7  3.5 - 5.2 g/dL   AST 401 (*) 0 - 37 U/L   ALT 606 (*) 0 - 53 U/L   Alkaline Phosphatase 265 (*) 39 - 117 U/L   Total Bilirubin 7.5 (*) 0.3 - 1.2 mg/dL   Bilirubin, Direct 6.2 (*) 0.0 - 0.3 mg/dL   Indirect Bilirubin 1.3 (*) 0.3 - 0.9 mg/dL  LIPASE, BLOOD  Status: Abnormal   Collection Time    05/29/13  8:45 PM      Result Value Range   Lipase 10 (*) 11 - 59 U/L  CG4 I-STAT (LACTIC ACID)     Status: None   Collection Time    05/29/13  8:56 PM      Result Value Range   Lactic Acid, Venous 1.56  0.5 - 2.2 mmol/L  POCT I-STAT, CHEM 8     Status: Abnormal   Collection Time    05/29/13  8:57 PM      Result Value Range   Sodium 140  135 - 145 mEq/L   Potassium 3.7  3.5 - 5.1 mEq/L   Chloride 107  96 - 112 mEq/L   BUN 26 (*) 6 - 23 mg/dL   Creatinine, Ser 1.10  0.50 - 1.35 mg/dL   Glucose, Bld 152 (*) 70 - 99 mg/dL   Calcium, Ion 1.17  1.13 - 1.30 mmol/L   TCO2 24  0 - 100 mmol/L   Hemoglobin 15.0  13.0 - 17.0 g/dL   HCT 44.0  39.0 - 52.0 %  POCT I-STAT TROPONIN I     Status: None   Collection Time    05/29/13  9:01 PM      Result Value Range   Troponin i, poc 0.06  0.00 - 0.08 ng/mL   Comment 3            Comment: Due to the release kinetics of cTnI,     a negative result within the first hours     of the onset of symptoms does not rule out     myocardial infarction with certainty.     If myocardial infarction is still  suspected,     repeat the test at appropriate intervals.  URINALYSIS, ROUTINE W REFLEX MICROSCOPIC     Status: Abnormal   Collection Time    05/29/13 10:01 PM      Result Value Range   Color, Urine RED (*) YELLOW   Comment: BIOCHEMICALS MAY BE AFFECTED BY COLOR   APPearance CLOUDY (*) CLEAR   Specific Gravity, Urine 1.038 (*) 1.005 - 1.030   pH 5.5  5.0 - 8.0   Glucose, UA NEGATIVE  NEGATIVE mg/dL   Hgb urine dipstick NEGATIVE  NEGATIVE   Bilirubin Urine LARGE (*) NEGATIVE   Ketones, ur 40 (*) NEGATIVE mg/dL   Protein, ur 100 (*) NEGATIVE mg/dL   Urobilinogen, UA 1.0  0.0 - 1.0 mg/dL   Nitrite POSITIVE (*) NEGATIVE   Leukocytes, UA SMALL (*) NEGATIVE  URINE MICROSCOPIC-ADD ON     Status: Abnormal   Collection Time    05/29/13 10:01 PM      Result Value Range   WBC, UA 7-10  <3 WBC/hpf   Bacteria, UA FEW (*) RARE   Casts HYALINE CASTS (*) NEGATIVE   Comment: WBC CAST   Urine-Other MUCOUS PRESENT     US Abdomen Complete  05/29/2013   *RADIOLOGY REPORT*  Clinical Data:  Abdominal pain.  Nausea and vomiting.  COMPLETE ABDOMINAL ULTRASOUND  Comparison:  No priors.  Findings:  Gallbladder:  Multiple echogenic foci with posterior acoustic shadowing within the lumen of the gallbladder, compatible with gallstones.  Gallbladder wall thickness is slightly increased at 6.3 mm.  No abnormal pericholecystic fluid.  Per report from the sonographer, there was a positive sonographic Murphy's sign on examination.  Common bile duct:  Mildly dilated measuring up to 8.9 mm in the  porta hepatis.  Liver:  Diffusely echogenic hepatic parenchyma, suggestive of hepatic steatosis.  No definite focal cystic or solid hepatic lesions.  Probable mild intrahepatic biliary ductal dilatation.  IVC:  Cannot be visualized secondary to overlying bowel gas.  Pancreas:  Cannot be visualized secondary to overlying bowel gas.  Spleen:  Normal size and echotexture without focal parenchymal abnormality.8.0 cm in length.  Right  Kidney:  No hydronephrosis.  Well-preserved cortex.  Normal size and parenchymal echotexture. 11.3 cm in length.  2.5 x 2.6 x 2.8 cm anechoic lesion with increased through transmission in the interpolar region of the kidney compatible with a simple cyst.  Left Kidney:  Poorly visualized secondary to body habitus.  No hydronephrosis.  Well-preserved cortex.  Normal size and parenchymal echotexture without focal abnormalities. 11.5 cm in length.  Abdominal aorta:  Atherosclerotic measuring up to 2.1 cm in diameter proximally, and tapers appropriately distally.  IMPRESSION: 1.  Study is positive for cholelithiasis, and given the gallbladder wall thickening and positive Murphy's sign, it is highly concerning for acute cholecystitis.  Additionally, there is some mild dilatation of the common bile duct and probable intrahepatic biliary ductal dilatation, which increases the likelihood of choledocholithiasis and biliary tract obstruction. Surgical consultation is recommended. 2.  Simple cyst in the right kidney. 3.  Atherosclerosis.   Original Report Authenticated By: Vinnie Langton, M.D.    Review of Systems  Constitutional: Positive for malaise/fatigue.  HENT: Negative.   Respiratory: Negative for shortness of breath.   Cardiovascular: Negative for chest pain and palpitations.  Gastrointestinal: Positive for nausea, vomiting and abdominal pain.  Genitourinary: Negative.   Musculoskeletal: Negative.   Skin: Negative.   Neurological: Positive for weakness.  Endo/Heme/Allergies: Negative.   Psychiatric/Behavioral: Negative.     Blood pressure 160/93, pulse 117, temperature 98.1 F (36.7 C), temperature source Oral, resp. rate 16, height 6' (1.829 m), weight 254 lb (115.214 kg), SpO2 94.00%. Physical Exam  Constitutional: He is oriented to person, place, and time. He appears well-developed and well-nourished.  HENT:  Head: Normocephalic and atraumatic.  Eyes: EOM are normal. Pupils are equal, round,  and reactive to light.  Neck: Normal range of motion. Neck supple.  Cardiovascular: Regular rhythm.   Murmur heard.  Systolic murmur is present with a grade of 3/6  Respiratory: Effort normal and breath sounds normal.  GI: There is tenderness in the right upper quadrant. There is positive Murphy's sign.  Musculoskeletal: Normal range of motion.  Neurological: He is alert and oriented to person, place, and time.  Skin: Skin is warm and dry.  Psychiatric: He has a normal mood and affect. His behavior is normal. Judgment and thought content normal.     Assessment/Plan Acute cholecystitis Patient Active Problem List   Diagnosis Date Noted  . TOBACCO ABUSE 02/22/2010  . DYSLIPIDEMIA 06/25/2009  . MITRAL INSUFFICIENCY 06/25/2009  . HYPERTENSION 06/25/2009  . AORTIC INSUFFICIENCY 06/25/2009  . VALVULAR HEART DISEASE 06/25/2009  . CARDIOVASCULAR DISEASE 06/25/2009  . CEREBROVASCULAR DISEASE 06/25/2009  . CEREBROVASCULAR ACCIDENT WITH RIGHT HEMIPARESIS 06/25/2009  . COPD 06/25/2009  . FOOT SURGERY, HX OF 06/25/2009  . POLYPECTOMY, HX OF 06/25/2009  . TONSILLECTOMY, HX OF 06/25/2009  admit for IV fluids antibiotics. Start Cipro 4 00mg  IV every 12. N.p.o. After midnight. Possible cholecystectomy versus percutaneous drainage in a.m. We'll reevaluate in morning and may need medical clearance for cardiology consultation to determine risk of general anesthesia.  Maimuna Leaman A. 05/29/2013, 10:35 PM

## 2013-05-29 NOTE — ED Provider Notes (Signed)
History     CSN: [id number]  Arrival date & time 05/29/13  2005   First MD Initiated Contact with Patient 05/29/13 2021      Chief Complaint  Patient presents with  . Nausea  . Emesis  . Diarrhea    (Consider location/radiation/quality/duration/timing/severity/associated sxs/prior treatment) HPI  67 year old male with history of hyperlipidemia, coronary disease, presents complaining of nausea vomiting, diarrhea with associated abdominal pain. Patient reports for the past 3 days he has had persistent nausea with more than 20 bouts of nonbloody but bilious vomitus per day. Did have several bouts of loose stool several days ago but none since. She also endorsed right upper quadrant abdominal pain described as a sharp and aching sensation radiates to his back. He also endorsed low abnormal pain since yesterday, the urge to urinate without producing any urine. He denies fever, chills, headache, chest pain, shortness of breath, dizziness lightheadedness, numbness or weakness. Denies any rash. No prior history of abdominal surgery. Still has an intact gallbladder. Has appetite but unable to eat. Patient feels dehydrated. Patient rates his right upper quadrant abdominal pain as 8/10 and suprapubic pain 6/10. He was seen at his primary care Dr. office today, labs at the office indicate that patient has an elevated white count of 17.2 and he was recommended to come to the ER for further evaluation.   Past Medical History  Diagnosis Date  . Chronic airway obstruction, not elsewhere classified   . Hemiplegia affecting unspecified side, late effect of cerebrovascular disease   . Cerebrovascular disease, unspecified   . Mitral valve insufficiency and aortic valve insufficiency   . Aortic insufficiency   . Valvular heart disease   . Dyslipidemia   . Cardiovascular disease   . Hypertension   . Stroke   . Coronary artery disease     Past Surgical History  Procedure Laterality Date  . Foot  surgery    . Back surgery    . Polypectomy    . Tonsillectomy      No family history on file.  History  Substance Use Topics  . Smoking status: Current Some Day Smoker    Types: Cigarettes    Last Attempt to Quit: 07/15/2011  . Smokeless tobacco: Never Used  . Alcohol Use: Yes     Comment: moderate wine      Review of Systems  All other systems reviewed and are negative.    Allergies  Bee venom  Home Medications   Current Outpatient Rx  Name  Route  Sig  Dispense  Refill  . ADVICOR 500-20 MG 24 hr tablet      TAKE 2 TABLETS BY MOUTH AT BEDITME   60 tablet   11   . aspirin EC 81 MG tablet   Oral   Take 81 mg by mouth daily.           [name] buPROPion (WELLBUTRIN SR) 150 MG 12 hr tablet   Oral   Take 150 mg by mouth 2 (two) times daily.          . citalopram (CELEXA) 20 MG tablet   Oral   Take 20 mg by mouth daily.          . metoprolol (TOPROL-XL) 50 MG 24 hr tablet   Oral   Take 50 mg by mouth daily.          . promethazine (PHENERGAN) 25 MG tablet   Oral   Take 25 mg by mouth every 6 (six) hours  as needed for nausea (nausea).         . ranitidine (ZANTAC) 75 MG tablet   Oral   Take 75 mg by mouth 2 (two) times daily.           [name] lisinopril (PRINIVIL,ZESTRIL) 10 MG tablet   Oral   Take 1 tablet (10 mg total) by mouth daily.   90 tablet   3     BP 160/93  Pulse 117  Temp(Src) 98.1 F (36.7 C) (Oral)  Resp 16  Ht 6' (1.829 m)  Wt 254 lb (115.214 kg)  BMI 34.44 kg/m2  SpO2 94%  Physical Exam  Nursing note and vitals reviewed. Constitutional: He is oriented to person, place, and time. He appears well-developed and well-nourished. No distress.  Awake, alert, nontoxic appearance.  Hard of hearing  HENT:  Head: Atraumatic.  Mouth/Throat: Oropharynx is clear and moist.  Eyes: Conjunctivae are normal. Right eye exhibits no discharge. Left eye exhibits no discharge. Scleral icterus is present.  Hearing aid in L ear  Neck: Normal  range of motion. Neck supple.  Cardiovascular: Normal rate and regular rhythm.   Pulmonary/Chest: Effort normal. No respiratory distress. He exhibits no tenderness.  Abdominal: Soft. He exhibits no distension. There is tenderness (tenderness to RUQ without guarding or rebound tenderness.  Positive Murphy sign, tenderness to suprapupic without guarding, no hernia noted.  ). There is no rebound.  Genitourinary:  No cva tenderness  Musculoskeletal: He exhibits no tenderness.  ROM appears intact, no obvious focal weakness  Neurological: He is alert and oriented to person, place, and time.  Skin: Skin is warm and dry. No rash noted.  jaundice  Psychiatric: He has a normal mood and affect.    ED Course  Procedures (including critical care time)  10:13 PM Patient presents with right upper quadrant abdominal pain with associate previous vomits. Abdominal ultrasound shows evidence suggestive of acute cholecystitis. Patient has an elevated WBC of 16, he is however afebrile.  Will consult general surgery.  Care discussed with attending and also with patient.  Pain medication given.  Pt is hemodynamically stable at this time.  Nontoxic in appearance.    10:26 PM Pt has jaundice, has transaminitis concerning for ascending cholangeitis especially considering mildly dilated CBD of 8.9.  .  I have consulted with general surgery, who agrees to admit pt for further management.  Pt currently stable, felt better after receiving pain medication and antinausea medication.  Pt is aware of plan.    Labs Reviewed  CBC WITH DIFFERENTIAL - Abnormal; Notable for the following:    WBC 16.0 (*)    Neutrophils Relative % 83 (*)    Neutro Abs 13.3 (*)    Lymphocytes Relative 8 (*)    Monocytes Absolute 1.4 (*)    All other components within normal limits  HEPATIC FUNCTION PANEL - Abnormal; Notable for the following:    AST 401 (*)    ALT 606 (*)    Alkaline Phosphatase 265 (*)    Total Bilirubin 7.5 (*)     Bilirubin, Direct 6.2 (*)    Indirect Bilirubin 1.3 (*)    All other components within normal limits  LIPASE, BLOOD - Abnormal; Notable for the following:    Lipase 10 (*)    All other components within normal limits  URINALYSIS, ROUTINE W REFLEX MICROSCOPIC - Abnormal; Notable for the following:    Color, Urine RED (*)    APPearance CLOUDY (*)    Specific Gravity,  Urine 1.038 (*)    Bilirubin Urine LARGE (*)    Ketones, ur 40 (*)    Protein, ur 100 (*)    Nitrite POSITIVE (*)    Leukocytes, UA SMALL (*)    All other components within normal limits  URINE MICROSCOPIC-ADD ON - Abnormal; Notable for the following:    Bacteria, UA FEW (*)    Casts HYALINE CASTS (*)    All other components within normal limits  POCT I-STAT, CHEM 8 - Abnormal; Notable for the following:    BUN 26 (*)    Glucose, Bld 152 (*)    All other components within normal limits  URINE CULTURE  COMPREHENSIVE METABOLIC PANEL  CBC  COMPREHENSIVE METABOLIC PANEL  CG4 I-STAT (LACTIC ACID)  POCT I-STAT TROPONIN I   US Abdomen Complete  05/29/2013   *RADIOLOGY REPORT*  Clinical Data:  Abdominal pain.  Nausea and vomiting.  COMPLETE ABDOMINAL ULTRASOUND  Comparison:  No priors.  Findings:  Gallbladder:  Multiple echogenic foci with posterior acoustic shadowing within the lumen of the gallbladder, compatible with gallstones.  Gallbladder wall thickness is slightly increased at 6.3 mm.  No abnormal pericholecystic fluid.  Per report from the sonographer, there was a positive sonographic Murphy's sign on examination.  Common bile duct:  Mildly dilated measuring up to 8.9 mm in the porta hepatis.  Liver:  Diffusely echogenic hepatic parenchyma, suggestive of hepatic steatosis.  No definite focal cystic or solid hepatic lesions.  Probable mild intrahepatic biliary ductal dilatation.  IVC:  Cannot be visualized secondary to overlying bowel gas.  Pancreas:  Cannot be visualized secondary to overlying bowel gas.  Spleen:  Normal  size and echotexture without focal parenchymal abnormality.8.0 cm in length.  Right Kidney:  No hydronephrosis.  Well-preserved cortex.  Normal size and parenchymal echotexture. 11.3 cm in length.  2.5 x 2.6 x 2.8 cm anechoic lesion with increased through transmission in the interpolar region of the kidney compatible with a simple cyst.  Left Kidney:  Poorly visualized secondary to body habitus.  No hydronephrosis.  Well-preserved cortex.  Normal size and parenchymal echotexture without focal abnormalities. 11.5 cm in length.  Abdominal aorta:  Atherosclerotic measuring up to 2.1 cm in diameter proximally, and tapers appropriately distally.  IMPRESSION: 1.  Study is positive for cholelithiasis, and given the gallbladder wall thickening and positive Murphy's sign, it is highly concerning for acute cholecystitis.  Additionally, there is some mild dilatation of the common bile duct and probable intrahepatic biliary ductal dilatation, which increases the likelihood of choledocholithiasis and biliary tract obstruction. Surgical consultation is recommended. 2.  Simple cyst in the right kidney. 3.  Atherosclerosis.   Original Report Authenticated By: Vinnie Langton, M.D.     1. Acute cholecystitis       MDM  BP 140/65  Pulse 116  Temp(Src) 98.6 F (37 C) (Oral)  Resp 18  Ht 6' (1.829 m)  Wt 254 lb (115.214 kg)  BMI 34.44 kg/m2  SpO2 97%  I have reviewed nursing notes and vital signs. I personally reviewed the imaging tests through PACS system  I reviewed available ER/hospitalization records thought the EMR         Domenic Moras, PA-C 05/30/13 0045

## 2013-05-29 NOTE — ED Provider Notes (Signed)
Pt with hx of RUQ pain, n/v/d X 3 days.  On exam he has become jaundiced, has RUQ ttp and murphy's sign.  Labs show that he had elevated WBC, elevated LFT's and US showing cholecystitis and some biliary dilatation - Surgery consulted, abx ordered, tachycardia being treated with abx, fluids and pain meds.  Medical screening examination/treatment/procedure(s) were conducted as a shared visit with non-physician practitioner(s) and myself.  I personally evaluated the patient during the encounter    Johnna Acosta, MD 05/29/13 2226

## 2013-05-29 NOTE — Progress Notes (Signed)
Patient ID: Brandon [name], male   DOB: [birth year], 67 y.o.   MRN: [id number] Liver function studies showed elevated bilirubin and transaminases. We'll recheck in a.m. May need GI consultation for ERCP prior to surgical intervention.

## 2013-05-30 ENCOUNTER — Inpatient Hospital Stay (HOSPITAL_COMMUNITY): Payer: Medicare Other

## 2013-05-30 ENCOUNTER — Encounter (HOSPITAL_COMMUNITY): Payer: Self-pay | Admitting: Cardiology

## 2013-05-30 DIAGNOSIS — K8020 Calculus of gallbladder without cholecystitis without obstruction: Secondary | ICD-10-CM | POA: Diagnosis not present

## 2013-05-30 DIAGNOSIS — Z01810 Encounter for preprocedural cardiovascular examination: Secondary | ICD-10-CM

## 2013-05-30 DIAGNOSIS — K8040 Calculus of bile duct with cholecystitis, unspecified, without obstruction: Secondary | ICD-10-CM

## 2013-05-30 DIAGNOSIS — I679 Cerebrovascular disease, unspecified: Secondary | ICD-10-CM

## 2013-05-30 DIAGNOSIS — I359 Nonrheumatic aortic valve disorder, unspecified: Secondary | ICD-10-CM

## 2013-05-30 LAB — COMPREHENSIVE METABOLIC PANEL
ALT: 514 U/L — ABNORMAL HIGH (ref 0–53)
AST: 351 U/L — ABNORMAL HIGH (ref 0–37)
Albumin: 3 g/dL — ABNORMAL LOW (ref 3.5–5.2)
Alkaline Phosphatase: 228 U/L — ABNORMAL HIGH (ref 39–117)
Potassium: 4.4 mEq/L (ref 3.5–5.1)
Sodium: 139 mEq/L (ref 135–145)
Total Protein: 6.8 g/dL (ref 6.0–8.3)

## 2013-05-30 LAB — CBC
Hemoglobin: 12.6 g/dL — ABNORMAL LOW (ref 13.0–17.0)
MCHC: 33.3 g/dL (ref 30.0–36.0)
Platelets: 214 10*3/uL (ref 150–400)
RDW: 13.7 % (ref 11.5–15.5)

## 2013-05-30 MED ORDER — GADOBENATE DIMEGLUMINE 529 MG/ML IV SOLN
20.0000 mL | Freq: Once | INTRAVENOUS | Status: AC | PRN
  Administered 2013-05-30: 20 mL via INTRAVENOUS

## 2013-05-30 NOTE — Consult Note (Signed)
Referring Provider: Dr. Marlou Starks Primary Care Physician:  Hulen Shouts, MD Primary Gastroenterologist:  Althia Forts  Reason for Consultation:  Jaundice; Elevated LFTs; Abdominal pain  HPI: Brandon [name] is a 67 y.o. male who had the acute onset of severe upper abdominal pain that was across his entire upper abdomen "like a band" and started Sunday. Pain fluctuated from dull, ache to severe sharp but when the pain would occur he had to stop all activities. He was camping when pain started and was too sick to leave the camper. Denies F/C. Developed recurrent vomiting yesterday with nausea. Denies previous history of gallstones. U/S showed cholecystitis with mildly dilated CBD. AST 401, ALT 606, TB 7.5 Lipase normal. Rare ETOH. He had a CVA in 2004 and his wife denies any residual effects. He is extremely hard of hearing and wife helps with his history.   Past Medical History  Diagnosis Date  . Chronic airway obstruction, not elsewhere classified   . Hemiplegia affecting unspecified side, late effect of cerebrovascular disease   . Cerebrovascular disease, unspecified   . Mitral valve insufficiency and aortic valve insufficiency   . Aortic insufficiency   . Valvular heart disease   . Dyslipidemia   . Cardiovascular disease   . Hypertension   . Stroke   . Coronary artery disease     Past Surgical History  Procedure Laterality Date  . Foot surgery    . Back surgery    . Polypectomy    . Tonsillectomy      Prior to Admission medications   Medication Sig Start Date End Date Taking? Authorizing Provider  ADVICOR 500-20 MG 24 hr tablet TAKE 2 TABLETS BY MOUTH AT BEDITME 02/15/13  Yes Fay Records, MD  aspirin EC 81 MG tablet Take 81 mg by mouth daily.     Yes Historical Provider, MD  buPROPion (WELLBUTRIN SR) 150 MG 12 hr tablet Take 150 mg by mouth 2 (two) times daily.  10/07/11  Yes Historical Provider, MD  citalopram (CELEXA) 20 MG tablet Take 20 mg by mouth daily.  09/19/11  Yes  Historical Provider, MD  metoprolol (TOPROL-XL) 50 MG 24 hr tablet Take 50 mg by mouth daily.  08/25/11  Yes Historical Provider, MD  promethazine (PHENERGAN) 25 MG tablet Take 25 mg by mouth every 6 (six) hours as needed for nausea (nausea).   Yes Historical Provider, MD  ranitidine (ZANTAC) 75 MG tablet Take 75 mg by mouth 2 (two) times daily.     Yes Historical Provider, MD  lisinopril (PRINIVIL,ZESTRIL) 10 MG tablet Take 1 tablet (10 mg total) by mouth daily. 07/24/12   Fay Records, MD    Scheduled Meds: . ciprofloxacin  400 mg Intravenous BID  . enoxaparin (LOVENOX) injection  40 mg Subcutaneous QHS  . lisinopril  10 mg Oral Daily  . metoprolol succinate  50 mg Oral Daily  . niacin  500 mg Oral QHS  . pantoprazole (PROTONIX) IV  40 mg Intravenous QHS  . simvastatin  10 mg Oral q1800   Continuous Infusions: . dextrose 5 % and 0.9% NaCl 75 mL/hr at 05/30/13 0957   PRN Meds:.acetaminophen, acetaminophen, HYDROmorphone (DILAUDID) injection, ondansetron  Allergies as of 05/29/2013 - Review Complete 05/29/2013  Allergen Reaction Noted  . Bee venom Anaphylaxis and Swelling 11/14/2011    History reviewed. No pertinent family history.  History   Social History  . Marital Status: Married    Spouse Name: N/A    Number of Children: N/A  .  Years of Education: N/A   Occupational History  . Not on file.   Social History Main Topics  . Smoking status: Current Some Day Smoker    Types: Cigarettes    Last Attempt to Quit: 07/15/2011  . Smokeless tobacco: Never Used  . Alcohol Use: Yes     Comment: moderate wine  . Drug Use: No  . Sexually Active: Not on file   Other Topics Concern  . Not on file   Social History Narrative  . No narrative on file    Review of Systems: All negative from GI standpoint except as stated above in HPI. Admit H/P ROS reviewed and agree.  Physical Exam: Vital signs: Filed Vitals:   05/30/13 1000  BP: 160/71  Pulse: 97  Temp: 99 F (37.2 C)   Resp: 20   Last BM Date: 05/27/13 General:  Poor dentition, Alert,  Well-developed, well-nourished, pleasant and cooperative in NAD, hard of hearing HEENT: +scleral icterus, poor dentition, oropharynx clear Neck: supple, nontender Lungs:  Clear throughout to auscultation.   No wheezes, crackles, or rhonchi. No acute distress. Heart:  Regular rate and rhythm; no murmurs, clicks, rubs,  or gallops. Abdomen: RUQ and epigastric tenderness with guarding, soft, nondistended, +BS  Rectal:  Deferred Ext: no edema  GI:  Lab Results:  Recent Labs  05/29/13 2045 05/29/13 2057 05/30/13 0410  WBC 16.0*  --  13.6*  HGB 14.6 15.0 12.6*  HCT 41.5 44.0 37.8*  PLT 254  --  214   BMET  Recent Labs  05/29/13 2057 05/30/13 0410  NA 140 139  K 3.7 4.4  CL 107 104  CO2  --  24  GLUCOSE 152* 152*  BUN 26* 22  CREATININE 1.10 0.83  CALCIUM  --  9.0   LFT  Recent Labs  05/29/13 2045 05/30/13 0410  PROT 7.9 6.8  ALBUMIN 3.7 3.0*  AST 401* 351*  ALT 606* 514*  ALKPHOS 265* 228*  BILITOT 7.5* 6.5*  BILIDIR 6.2*  --   IBILI 1.3*  --    PT/INR No results found for this basename: LABPROT, INR,  in the last 72 hours   Studies/Results: US Abdomen Complete  05/29/2013   *RADIOLOGY REPORT*  Clinical Data:  Abdominal pain.  Nausea and vomiting.  COMPLETE ABDOMINAL ULTRASOUND  Comparison:  No priors.  Findings:  Gallbladder:  Multiple echogenic foci with posterior acoustic shadowing within the lumen of the gallbladder, compatible with gallstones.  Gallbladder wall thickness is slightly increased at 6.3 mm.  No abnormal pericholecystic fluid.  Per report from the sonographer, there was a positive sonographic Murphy's sign on examination.  Common bile duct:  Mildly dilated measuring up to 8.9 mm in the porta hepatis.  Liver:  Diffusely echogenic hepatic parenchyma, suggestive of hepatic steatosis.  No definite focal cystic or solid hepatic lesions.  Probable mild intrahepatic biliary ductal  dilatation.  IVC:  Cannot be visualized secondary to overlying bowel gas.  Pancreas:  Cannot be visualized secondary to overlying bowel gas.  Spleen:  Normal size and echotexture without focal parenchymal abnormality.8.0 cm in length.  Right Kidney:  No hydronephrosis.  Well-preserved cortex.  Normal size and parenchymal echotexture. 11.3 cm in length.  2.5 x 2.6 x 2.8 cm anechoic lesion with increased through transmission in the interpolar region of the kidney compatible with a simple cyst.  Left Kidney:  Poorly visualized secondary to body habitus.  No hydronephrosis.  Well-preserved cortex.  Normal size and parenchymal echotexture without focal abnormalities.  11.5 cm in length.  Abdominal aorta:  Atherosclerotic measuring up to 2.1 cm in diameter proximally, and tapers appropriately distally.  IMPRESSION: 1.  Study is positive for cholelithiasis, and given the gallbladder wall thickening and positive Murphy's sign, it is highly concerning for acute cholecystitis.  Additionally, there is some mild dilatation of the common bile duct and probable intrahepatic biliary ductal dilatation, which increases the likelihood of choledocholithiasis and biliary tract obstruction. Surgical consultation is recommended. 2.  Simple cyst in the right kidney. 3.  Atherosclerosis.   Original Report Authenticated By: Vinnie Langton, M.D.    Impression/Plan: 67 yo with acute cholecystitis and markedly elevated LFTs due to gallstones. U/S with mildly dilated CBD and questionable intrahepatic biliary dilation. He may have passed a stone or have a CBD stone present but his labs are trending down and would recommend an MRCP as next step. If stone seen then will need a preop ERCP. If MRCP negative, then will defer to surgery timing of cholecystectomy vs. Cholecystostomy tube. Follow LFTs. NPO. Risks/benefits of ERCP discussed if it is needed but at this time will hold off on ERCP and do an MRCP as stated above. Thank you for this  consultation. Will follow.    LOS: 1 day   [place] C.  05/30/2013, 1:31 PM

## 2013-05-30 NOTE — Care Management Note (Signed)
    Page 1 of 1   05/30/2013     12:54:33 PM   CARE MANAGEMENT NOTE 05/30/2013  Patient:  Brandon [name], Brandon [name]   Account Number:  [id number]  Date Initiated:  05/30/2013  Documentation initiated by:  Sunday Spillers  Subjective/Objective Assessment:   67 yo male admitted with abd pain, elevated LFT's, acute cholecystitis. PTA lived at home with spouse.     Action/Plan:   Home when stable   Anticipated DC Date:  06/03/2013   Anticipated DC Plan:  [place]  CM consult      Choice offered to / List presented to:             Status of service:  Completed, signed off Medicare Important Message given?   (If response is "NO", the following Medicare IM given date fields will be blank) Date Medicare IM given:   Date Additional Medicare IM given:    Discharge Disposition:  HOME/SELF CARE  Per UR Regulation:  Reviewed for med. necessity/level of care/duration of stay  If discussed at [place] of Stay Meetings, dates discussed:    Comments:

## 2013-05-30 NOTE — Progress Notes (Signed)
Subjective: Feels a little better this am. Still has some nausea. Abdominal pain is better. Also notes that he is unable to urinate  Objective: Vital signs in last 24 hours: Temp:  [97.9 F (36.6 C)-99 F (37.2 C)] 99 F (37.2 C) (06/19 1000) Pulse Rate:  [90-117] 97 (06/19 1000) Resp:  [16-20] 20 (06/19 1000) BP: (117-167)/(65-93) 160/71 mmHg (06/19 1000) SpO2:  [93 %-98 %] 93 % (06/19 1000) Weight:  [254 lb (115.214 kg)] 254 lb (115.214 kg) (06/18 2040) Last BM Date: 05/27/13  Intake/Output from previous day: 06/18 0701 - 06/19 0700 In: 597.5 [I.V.:397.5; IV Piggyback:200] Out: 550 [Urine:550] Intake/Output this shift: Total I/O In: 0  Out: 250 [id number]  Cardio: systolic murmur GI: soft, mild to moderate tenderness in RUQ  Lab Results:   Recent Labs  05/29/13 2045 05/29/13 2057 05/30/13 0410  WBC 16.0*  --  13.6*  HGB 14.6 15.0 12.6*  HCT 41.5 44.0 37.8*  PLT 254  --  214   BMET  Recent Labs  05/29/13 2057 05/30/13 0410  NA 140 139  K 3.7 4.4  CL 107 104  CO2  --  24  GLUCOSE 152* 152*  BUN 26* 22  CREATININE 1.10 0.83  CALCIUM  --  9.0   PT/INR No results found for this basename: LABPROT, INR,  in the last 72 hours ABG No results found for this basename: PHART, PCO2, PO2, HCO3,  in the last 72 hours  Studies/Results: US Abdomen Complete  05/29/2013   *RADIOLOGY REPORT*  Clinical Data:  Abdominal pain.  Nausea and vomiting.  COMPLETE ABDOMINAL ULTRASOUND  Comparison:  No priors.  Findings:  Gallbladder:  Multiple echogenic foci with posterior acoustic shadowing within the lumen of the gallbladder, compatible with gallstones.  Gallbladder wall thickness is slightly increased at 6.3 mm.  No abnormal pericholecystic fluid.  Per report from the sonographer, there was a positive sonographic Murphy's sign on examination.  Common bile duct:  Mildly dilated measuring up to 8.9 mm in the porta hepatis.  Liver:  Diffusely echogenic hepatic parenchyma,  suggestive of hepatic steatosis.  No definite focal cystic or solid hepatic lesions.  Probable mild intrahepatic biliary ductal dilatation.  IVC:  Cannot be visualized secondary to overlying bowel gas.  Pancreas:  Cannot be visualized secondary to overlying bowel gas.  Spleen:  Normal size and echotexture without focal parenchymal abnormality.8.0 cm in length.  Right Kidney:  No hydronephrosis.  Well-preserved cortex.  Normal size and parenchymal echotexture. 11.3 cm in length.  2.5 x 2.6 x 2.8 cm anechoic lesion with increased through transmission in the interpolar region of the kidney compatible with a simple cyst.  Left Kidney:  Poorly visualized secondary to body habitus.  No hydronephrosis.  Well-preserved cortex.  Normal size and parenchymal echotexture without focal abnormalities. 11.5 cm in length.  Abdominal aorta:  Atherosclerotic measuring up to 2.1 cm in diameter proximally, and tapers appropriately distally.  IMPRESSION: 1.  Study is positive for cholelithiasis, and given the gallbladder wall thickening and positive Murphy's sign, it is highly concerning for acute cholecystitis.  Additionally, there is some mild dilatation of the common bile duct and probable intrahepatic biliary ductal dilatation, which increases the likelihood of choledocholithiasis and biliary tract obstruction. Surgical consultation is recommended. 2.  Simple cyst in the right kidney. 3.  Atherosclerosis.   Original Report Authenticated By: Vinnie Langton, M.D.    Anti-infectives: Anti-infectives   Start     Dose/Rate Route Frequency Ordered Stop   05/29/13 2345  ciprofloxacin (CIPRO) IVPB 400 mg     400 mg 200 mL/hr over 60 Minutes Intravenous 2 times daily 05/29/13 2336     05/29/13 2230  Ampicillin-Sulbactam (UNASYN) 3 g in sodium chloride 0.9 % 100 mL IVPB     3 g 100 mL/hr over 60 Minutes Intravenous  Once 05/29/13 2225 05/29/13 2323      Assessment/Plan: s/p * No surgery found * pt has elevated lft's and  dilated bile ducts. Will consult GI for possible ERCP Pt also has aortic stenosis. Will consult Cardiology for help deciding whether he can have surgery if necessary Since this has been going on several days and with his underlying medical issues we may consider having gallbladder perc drained if he does not improve. Will continue to cover with broad spectrum abx  LOS: 1 day    TOTH III,Faruq Rosenberger S 05/30/2013

## 2013-05-30 NOTE — Consult Note (Signed)
CARDIOLOGY CONSULT NOTE  Patient ID: Brandon [name] MRN: [id number] DOB/AGE: 04/21/1946 67 y.o.  Admit date: 05/29/2013 Primary Physician Hulen Shouts, MD Primary Cardiologist Dr. Harrington Challenger Chief Complaint  Preop  HPI:  The patient is admitted with right upper quadrant abdominal pain with nausea and vomitting.  He is being evaluated for possible cholecystitis.  We are asked to comment on his risk of surgery given aortic valve disease.  His most recent echo earlier this year demonstrated mild to moderate AS with a mean gradient of 23 and moderate AI.  He has a history of CVA with carotid stenosis.    The patient reports that he has had no recent cardiovascular symptoms. He says that he is active and was digging a hole recently in his yard without any symptoms.  The patient denies any new symptoms such as chest discomfort, neck or arm discomfort. There has been no new shortness of breath, PND or orthopnea. There have been no reported palpitations, presyncope or syncope.   Past Medical History  Diagnosis Date  . Hemiplegia affecting unspecified side, late effect of cerebrovascular disease   . Mitral valve insufficiency and aortic valve insufficiency   . Aortic insufficiency   . Dyslipidemia   . Hypertension   . Stroke   . Coronary artery disease     Past Surgical History  Procedure Laterality Date  . Foot surgery      right  . Back surgery      lumbar back  . Polypectomy    . Tonsillectomy      Allergies  Allergen Reactions  . Bee Venom Anaphylaxis and Swelling   Prescriptions prior to admission  Medication Sig Dispense Refill  . ADVICOR 500-20 MG 24 hr tablet TAKE 2 TABLETS BY MOUTH AT BEDITME  60 tablet  11  . aspirin EC 81 MG tablet Take 81 mg by mouth daily.        [name] buPROPion (WELLBUTRIN SR) 150 MG 12 hr tablet Take 150 mg by mouth 2 (two) times daily.       . citalopram (CELEXA) 20 MG tablet Take 20 mg by mouth daily.       . metoprolol (TOPROL-XL) 50 MG 24 hr  tablet Take 50 mg by mouth daily.       . promethazine (PHENERGAN) 25 MG tablet Take 25 mg by mouth every 6 (six) hours as needed for nausea (nausea).      . ranitidine (ZANTAC) 75 MG tablet Take 75 mg by mouth 2 (two) times daily.        [name] lisinopril (PRINIVIL,ZESTRIL) 10 MG tablet Take 1 tablet (10 mg total) by mouth daily.  90 tablet  3   Family History  Problem Relation Age of Onset  . Stroke Father     No details  . Angina Mother     History   Social History  . Marital Status: Married    Spouse Name: N/A    Number of Children: 3  . Years of Education: N/A   Occupational History  .     Social History Main Topics  . Smoking status: Former Smoker    Types: Cigarettes    Quit date: 07/15/2011  . Smokeless tobacco: Never Used     Comment: Quit tobacco in December  . Alcohol Use: Yes     Comment: moderate wine  . Drug Use: No  . Sexually Active: Not on file   Other Topics Concern  . Not on file   Social  History Narrative   Two living children.  Lives with wife.       ROS:  Diarrhea.  Otherwise as stated in the HPI and negative for all other systems.  Physical Exam: Blood pressure 127/63, pulse 91, temperature 99.2 F (37.3 C), temperature source Oral, resp. rate 20, height 6' (1.829 m), weight 254 lb (115.214 kg), SpO2 94.00%.  GENERAL:  Well appearing HEENT:  Pupils equal round and reactive, fundi not visualized, oral mucosa unremarkable, poor dentition NECK:  No jugular venous distention, waveform within normal limits, carotid upstroke brisk and symmetric, bilateral bruits vs transmitted systolic murmur, no thyromegaly LYMPHATICS:  No cervical, inguinal adenopathy LUNGS:  Clear to auscultation bilaterally BACK:  No CVA tenderness CHEST:  Unremarkable HEART:  PMI not displaced or sustained,S1 and S2 within normal limits, no S3, no S4, no clicks, no rubs, apical systolic murmur radiating out the aortic outflow tract mid peaking, no diastolic murmurs ABD:  Flat,  positive bowel sounds normal in frequency in pitch, no bruits, no rebound, no guarding, no midline pulsatile mass, no hepatomegaly, no splenomegaly EXT:  2 plus pulses throughout, no edema, no cyanosis no clubbing SKIN:  No rashes no nodules NEURO:  Cranial nerves II through XII grossly intact, motor grossly intact throughout PSYCH:  Cognitively intact, oriented to person place and time   Labs: Lab Results  Component Value Date   BUN 22 05/30/2013   Lab Results  Component Value Date   CREATININE 0.83 05/30/2013   Lab Results  Component Value Date   NA 139 05/30/2013   K 4.4 05/30/2013   CL 104 05/30/2013   CO2 24 05/30/2013    Lab Results  Component Value Date   WBC 13.6* 05/30/2013   HGB 12.6* 05/30/2013   HCT 37.8* 05/30/2013   MCV 93.6 05/30/2013   PLT 214 05/30/2013    Lab Results  Component Value Date   ALT 514* 05/30/2013   AST 351* 05/30/2013   ALKPHOS 228* 05/30/2013   BILITOT 6.5* 05/30/2013    EKG: Sinus tachycardia rate 115, axis WNL, intervals WNL, no acute ST T wave changes.  05/30/2013  ASSESSMENT AND PLAN:   Preop evaluation:  The patient has no high risk findings.  He does not have a history of CAD (I clarified this with his family. I have removed this from his past medical history).  He has a high functional level.  He would potentially be going for a procedure that is low to moderate risk from a cardiovascular standpoint.  Therefore, based on ACC/AHA guidelines, the patient would be at acceptable risk for the planned procedure without further cardiovascular testing.  AS:  This has been moderate and there are no symptoms.  No further work up is needed.   CAROTID STENOSIS:  This will be followed again in November per Dr. Harrington Challenger  Signed: Minus Breeding 05/30/2013, 6:47 PM

## 2013-05-30 NOTE — ED Provider Notes (Signed)
Medical screening examination/treatment/procedure(s) were conducted as a shared visit with non-physician practitioner(s) and myself.  I personally evaluated the patient during the encounter  Please see my separate respective documentation pertaining to this patient encounter   Johnna Acosta, MD 05/30/13 1549

## 2013-05-31 ENCOUNTER — Inpatient Hospital Stay (HOSPITAL_COMMUNITY): Payer: Medicare Other

## 2013-05-31 ENCOUNTER — Encounter (HOSPITAL_COMMUNITY): Admission: EM | Disposition: A | Discharge status: Home / Self Care | Payer: Self-pay | Source: Home / Self Care

## 2013-05-31 ENCOUNTER — Encounter (HOSPITAL_COMMUNITY): Payer: Self-pay | Admitting: *Deleted

## 2013-05-31 DIAGNOSIS — R7402 Elevation of levels of lactic acid dehydrogenase (LDH): Secondary | ICD-10-CM

## 2013-05-31 DIAGNOSIS — K8050 Calculus of bile duct without cholangitis or cholecystitis without obstruction: Secondary | ICD-10-CM

## 2013-05-31 DIAGNOSIS — K810 Acute cholecystitis: Secondary | ICD-10-CM

## 2013-05-31 DIAGNOSIS — R932 Abnormal findings on diagnostic imaging of liver and biliary tract: Secondary | ICD-10-CM

## 2013-05-31 HISTORY — PX: ERCP: SHX5425

## 2013-05-31 LAB — HEPATIC FUNCTION PANEL
ALT: 426 U/L — ABNORMAL HIGH (ref 0–53)
Alkaline Phosphatase: 226 U/L — ABNORMAL HIGH (ref 39–117)
Bilirubin, Direct: 5.2 mg/dL — ABNORMAL HIGH (ref 0.0–0.3)
Indirect Bilirubin: 1.5 mg/dL — ABNORMAL HIGH (ref 0.3–0.9)

## 2013-05-31 LAB — URINE CULTURE: Culture: NO GROWTH

## 2013-05-31 SURGERY — ERCP, WITH INTERVENTION IF INDICATED
Anesthesia: Moderate Sedation

## 2013-05-31 MED ORDER — SODIUM CHLORIDE 0.9 % IV SOLN
INTRAVENOUS | Status: DC | PRN
  Administered 2013-05-31: 16:00:00

## 2013-05-31 MED ORDER — MIDAZOLAM HCL 10 MG/2ML IJ SOLN
INTRAMUSCULAR | Status: DC | PRN
  Administered 2013-05-31 (×3): 2 mg via INTRAVENOUS

## 2013-05-31 MED ORDER — FENTANYL CITRATE 0.05 MG/ML IJ SOLN
INTRAMUSCULAR | Status: AC
  Filled 2013-05-31: qty 4

## 2013-05-31 MED ORDER — BUTAMBEN-TETRACAINE-BENZOCAINE 2-2-14 % EX AERO
INHALATION_SPRAY | CUTANEOUS | Status: DC | PRN
  Administered 2013-05-31: 2 via TOPICAL

## 2013-05-31 MED ORDER — GLUCAGON HCL (RDNA) 1 MG IJ SOLR
INTRAMUSCULAR | Status: AC
  Filled 2013-05-31: qty 2

## 2013-05-31 MED ORDER — MIDAZOLAM HCL 10 MG/2ML IJ SOLN
INTRAMUSCULAR | Status: AC
  Filled 2013-05-31: qty 4

## 2013-05-31 MED ORDER — SODIUM CHLORIDE 0.9 % IV SOLN: INTRAVENOUS | Status: DC

## 2013-05-31 MED ORDER — FENTANYL CITRATE 0.05 MG/ML IJ SOLN
INTRAMUSCULAR | Status: DC | PRN
  Administered 2013-05-31 (×2): 25 ug via INTRAVENOUS

## 2013-05-31 MED ORDER — GLUCAGON HCL (RDNA) 1 MG IJ SOLR
INTRAMUSCULAR | Status: DC | PRN
  Administered 2013-05-31 (×2): .25 mg via INTRAVENOUS

## 2013-05-31 MED ORDER — DIPHENHYDRAMINE HCL 50 MG/ML IJ SOLN
INTRAMUSCULAR | Status: AC
  Filled 2013-05-31: qty 1

## 2013-05-31 NOTE — Interval H&P Note (Signed)
History and Physical Interval Note:  05/31/2013 2:07 PM  Brandon [name]  has presented today for surgery, with the diagnosis of CBD stone  The various methods of treatment have been discussed with the patient and family. After consideration of risks, benefits and other options for treatment, the patient has consented to  Procedure(s): ENDOSCOPIC RETROGRADE CHOLANGIOPANCREATOGRAPHY (ERCP) (N/A) as a surgical intervention .  The patient's history has been reviewed, patient examined, no change in status, stable for surgery.  I have reviewed the patient's chart and labs.  Questions were answered to the patient's satisfaction.     Pricilla Riffle. Fuller Plan MD

## 2013-05-31 NOTE — H&P (View-Only) (Signed)
Referring Provider: Dr. Marlou Starks Primary Care Physician:  Hulen Shouts, MD Primary Gastroenterologist:  Althia Forts  Reason for Consultation:  Jaundice; Elevated LFTs; Abdominal pain  HPI: Brandon [name] is a 67 y.o. male who had the acute onset of severe upper abdominal pain that was across his entire upper abdomen "like a band" and started Sunday. Pain fluctuated from dull, ache to severe sharp but when the pain would occur he had to stop all activities. He was camping when pain started and was too sick to leave the camper. Denies F/C. Developed recurrent vomiting yesterday with nausea. Denies previous history of gallstones. U/S showed cholecystitis with mildly dilated CBD. AST 401, ALT 606, TB 7.5 Lipase normal. Rare ETOH. He had a CVA in 2004 and his wife denies any residual effects. He is extremely hard of hearing and wife helps with his history.   Past Medical History  Diagnosis Date  . Chronic airway obstruction, not elsewhere classified   . Hemiplegia affecting unspecified side, late effect of cerebrovascular disease   . Cerebrovascular disease, unspecified   . Mitral valve insufficiency and aortic valve insufficiency   . Aortic insufficiency   . Valvular heart disease   . Dyslipidemia   . Cardiovascular disease   . Hypertension   . Stroke   . Coronary artery disease     Past Surgical History  Procedure Laterality Date  . Foot surgery    . Back surgery    . Polypectomy    . Tonsillectomy      Prior to Admission medications   Medication Sig Start Date End Date Taking? Authorizing Provider  ADVICOR 500-20 MG 24 hr tablet TAKE 2 TABLETS BY MOUTH AT BEDITME 02/15/13  Yes Fay Records, MD  aspirin EC 81 MG tablet Take 81 mg by mouth daily.     Yes Historical Provider, MD  buPROPion (WELLBUTRIN SR) 150 MG 12 hr tablet Take 150 mg by mouth 2 (two) times daily.  10/07/11  Yes Historical Provider, MD  citalopram (CELEXA) 20 MG tablet Take 20 mg by mouth daily.  09/19/11  Yes  Historical Provider, MD  metoprolol (TOPROL-XL) 50 MG 24 hr tablet Take 50 mg by mouth daily.  08/25/11  Yes Historical Provider, MD  promethazine (PHENERGAN) 25 MG tablet Take 25 mg by mouth every 6 (six) hours as needed for nausea (nausea).   Yes Historical Provider, MD  ranitidine (ZANTAC) 75 MG tablet Take 75 mg by mouth 2 (two) times daily.     Yes Historical Provider, MD  lisinopril (PRINIVIL,ZESTRIL) 10 MG tablet Take 1 tablet (10 mg total) by mouth daily. 07/24/12   Fay Records, MD    Scheduled Meds: . ciprofloxacin  400 mg Intravenous BID  . enoxaparin (LOVENOX) injection  40 mg Subcutaneous QHS  . lisinopril  10 mg Oral Daily  . metoprolol succinate  50 mg Oral Daily  . niacin  500 mg Oral QHS  . pantoprazole (PROTONIX) IV  40 mg Intravenous QHS  . simvastatin  10 mg Oral q1800   Continuous Infusions: . dextrose 5 % and 0.9% NaCl 75 mL/hr at 05/30/13 0957   PRN Meds:.acetaminophen, acetaminophen, HYDROmorphone (DILAUDID) injection, ondansetron  Allergies as of 05/29/2013 - Review Complete 05/29/2013  Allergen Reaction Noted  . Bee venom Anaphylaxis and Swelling 11/14/2011    History reviewed. No pertinent family history.  History   Social History  . Marital Status: Married    Spouse Name: N/A    Number of Children: N/A  .  Years of Education: N/A   Occupational History  . Not on file.   Social History Main Topics  . Smoking status: Current Some Day Smoker    Types: Cigarettes    Last Attempt to Quit: 07/15/2011  . Smokeless tobacco: Never Used  . Alcohol Use: Yes     Comment: moderate wine  . Drug Use: No  . Sexually Active: Not on file   Other Topics Concern  . Not on file   Social History Narrative  . No narrative on file    Review of Systems: All negative from GI standpoint except as stated above in HPI. Admit H/P ROS reviewed and agree.  Physical Exam: Vital signs: Filed Vitals:   05/30/13 1000  BP: 160/71  Pulse: 97  Temp: 99 F (37.2 C)   Resp: 20   Last BM Date: 05/27/13 General:  Poor dentition, Alert,  Well-developed, well-nourished, pleasant and cooperative in NAD, hard of hearing HEENT: +scleral icterus, poor dentition, oropharynx clear Neck: supple, nontender Lungs:  Clear throughout to auscultation.   No wheezes, crackles, or rhonchi. No acute distress. Heart:  Regular rate and rhythm; no murmurs, clicks, rubs,  or gallops. Abdomen: RUQ and epigastric tenderness with guarding, soft, nondistended, +BS  Rectal:  Deferred Ext: no edema  GI:  Lab Results:  Recent Labs  05/29/13 2045 05/29/13 2057 05/30/13 0410  WBC 16.0*  --  13.6*  HGB 14.6 15.0 12.6*  HCT 41.5 44.0 37.8*  PLT 254  --  214   BMET  Recent Labs  05/29/13 2057 05/30/13 0410  NA 140 139  K 3.7 4.4  CL 107 104  CO2  --  24  GLUCOSE 152* 152*  BUN 26* 22  CREATININE 1.10 0.83  CALCIUM  --  9.0   LFT  Recent Labs  05/29/13 2045 05/30/13 0410  PROT 7.9 6.8  ALBUMIN 3.7 3.0*  AST 401* 351*  ALT 606* 514*  ALKPHOS 265* 228*  BILITOT 7.5* 6.5*  BILIDIR 6.2*  --   IBILI 1.3*  --    PT/INR No results found for this basename: LABPROT, INR,  in the last 72 hours   Studies/Results: US Abdomen Complete  05/29/2013   *RADIOLOGY REPORT*  Clinical Data:  Abdominal pain.  Nausea and vomiting.  COMPLETE ABDOMINAL ULTRASOUND  Comparison:  No priors.  Findings:  Gallbladder:  Multiple echogenic foci with posterior acoustic shadowing within the lumen of the gallbladder, compatible with gallstones.  Gallbladder wall thickness is slightly increased at 6.3 mm.  No abnormal pericholecystic fluid.  Per report from the sonographer, there was a positive sonographic Murphy's sign on examination.  Common bile duct:  Mildly dilated measuring up to 8.9 mm in the porta hepatis.  Liver:  Diffusely echogenic hepatic parenchyma, suggestive of hepatic steatosis.  No definite focal cystic or solid hepatic lesions.  Probable mild intrahepatic biliary ductal  dilatation.  IVC:  Cannot be visualized secondary to overlying bowel gas.  Pancreas:  Cannot be visualized secondary to overlying bowel gas.  Spleen:  Normal size and echotexture without focal parenchymal abnormality.8.0 cm in length.  Right Kidney:  No hydronephrosis.  Well-preserved cortex.  Normal size and parenchymal echotexture. 11.3 cm in length.  2.5 x 2.6 x 2.8 cm anechoic lesion with increased through transmission in the interpolar region of the kidney compatible with a simple cyst.  Left Kidney:  Poorly visualized secondary to body habitus.  No hydronephrosis.  Well-preserved cortex.  Normal size and parenchymal echotexture without focal abnormalities.  11.5 cm in length.  Abdominal aorta:  Atherosclerotic measuring up to 2.1 cm in diameter proximally, and tapers appropriately distally.  IMPRESSION: 1.  Study is positive for cholelithiasis, and given the gallbladder wall thickening and positive Murphy's sign, it is highly concerning for acute cholecystitis.  Additionally, there is some mild dilatation of the common bile duct and probable intrahepatic biliary ductal dilatation, which increases the likelihood of choledocholithiasis and biliary tract obstruction. Surgical consultation is recommended. 2.  Simple cyst in the right kidney. 3.  Atherosclerosis.   Original Report Authenticated By: Vinnie Langton, M.D.    Impression/Plan: 67 yo with acute cholecystitis and markedly elevated LFTs due to gallstones. U/S with mildly dilated CBD and questionable intrahepatic biliary dilation. He may have passed a stone or have a CBD stone present but his labs are trending down and would recommend an MRCP as next step. If stone seen then will need a preop ERCP. If MRCP negative, then will defer to surgery timing of cholecystectomy vs. Cholecystostomy tube. Follow LFTs. NPO. Risks/benefits of ERCP discussed if it is needed but at this time will hold off on ERCP and do an MRCP as stated above. Thank you for this  consultation. Will follow.    LOS: 1 day   [place] C.  05/30/2013, 1:31 PM

## 2013-05-31 NOTE — Progress Notes (Signed)
Patient ID: Brandon [name], male   DOB: [birth year], 67 y.o.   MRN: [id number]  Labs and MRCP reviewed and agree an ERCP is the next step. Have discussed case with Dr. Fuller Plan and informed pt/wife and Dr. Fuller Plan will do ERCP this afternoon.

## 2013-05-31 NOTE — Op Note (Signed)
[facility] [place], [id number]   ERCP PROCEDURE REPORT  PATIENT: Brandon [name], Brandon [name].  MR# :[id number] BIRTHDATE: 30-Apr-1946  GENDER: Male ENDOSCOPIST: Ladene Artist, MD, [facility] REFERRED BY: Wilford Corner, M.D. PROCEDURE DATE:  05/31/2013 PROCEDURE:   ERCP with sphincterotomy/papillotomy and ERCP with removal of calculus/calculi ASA CLASS:   Class III INDICATIONS:abnormal MRCP.   established bile duct stone(s). jaundice.   abnormal liver function test . MEDICATIONS: These medications were titrated to patient response per physician's verbal order, Fentanyl 50 mcg IV, Versed 6 mg IV, and Glucagon 0.5 mg IV TOPICAL ANESTHETIC: Cetacaine Spray DESCRIPTION OF PROCEDURE:   After the risks benefits and alternatives of the procedure were thoroughly explained, informed consent was obtained.  The     endoscope was introduced through the mouth  and advanced to the second portion of the duodenum . 1.  The ampulla was located in the second portion of the duodenum. It appeared normal. 2.   No evidence of stones or filling defects on cholangiogram. 3.  With guidewire in the bile duct, a biliary sphincterotomy was performed using the sphincterotome. 4.  There was dilation of the common bile duct measuring 10 mm. The intrahepatic ducts appeared normal. 5.   Multiple balloon catheter pull throughs performed for a suspected small CBD stone (as noted on MRCP and with other findings) 6.   Duodenal ulcer, clean based, at D1/D2 jucntion. Multiple duodenal erosions in D1 and D2. The scope was then completely withdrawn from the patient and the procedure terminated.     COMPLICATIONS: There were no complications.  ENDOSCOPIC IMPRESSION: 1.    Normal appearing major ampulla; sphincterotomy performed 2.    No stones or filling defects; balloon pull throughs performed  3.    Mild CBD dilation 4.    Duodenal ulcer and erosive duodenitis  RECOMMENDATIONS: 1.   I  suspect he had a small stone that was removed or passed spontaneously prior to ERCP. Monitor LFTs and t. bili 2.   PPI BID for now 3.   Avoid ASA/NDSAIDs  eSigned:  Ladene Artist, MD, [facility] 05/31/2013 3:25 PM

## 2013-05-31 NOTE — Progress Notes (Signed)
Patient ID: Brandon [name], male   DOB: 07-22-1946, 67 y.o.   MRN: [id number]    Subjective: Pt feels better today, but still having some RUQ and epigastric tenderness  Objective: Vital signs in last 24 hours: Temp:  [98.5 F (36.9 C)-99.4 F (37.4 C)] 98.5 F (36.9 C) (06/20 0527) Pulse Rate:  [77-97] 77 (06/20 0527) Resp:  [18-20] 18 (06/20 0527) BP: (127-161)/(61-71) 161/67 mmHg (06/20 0527) SpO2:  [92 %-95 %] 92 % (06/20 0527) Last BM Date: 05/27/13  Intake/Output from previous day: 06/19 0701 - 06/20 0700 In: 2202.5 [I.V.:1802.5; IV Piggyback:400] Out: [id number] [Urine:1375] Intake/Output this shift:    PE: Abd: soft, mildly tender in RUQ and epigastrium, +BS, obese, ND Ht: regular, +murmur Lungs: CTAB  Lab Results:   Recent Labs  05/29/13 2045 05/29/13 2057 05/30/13 0410  WBC 16.0*  --  13.6*  HGB 14.6 15.0 12.6*  HCT 41.5 44.0 37.8*  PLT 254  --  214   BMET  Recent Labs  05/29/13 2057 05/30/13 0410  NA 140 139  K 3.7 4.4  CL 107 104  CO2  --  24  GLUCOSE 152* 152*  BUN 26* 22  CREATININE 1.10 0.83  CALCIUM  --  9.0   PT/INR No results found for this basename: LABPROT, INR,  in the last 72 hours CMP     Component Value Date/Time   NA 139 05/30/2013 0410   K 4.4 05/30/2013 0410   CL 104 05/30/2013 0410   CO2 24 05/30/2013 0410   GLUCOSE 152* 05/30/2013 0410   BUN 22 05/30/2013 0410   CREATININE 0.83 05/30/2013 0410   CALCIUM 9.0 05/30/2013 0410   PROT 6.2 05/31/2013 0354   ALBUMIN 2.7* 05/31/2013 0354   AST 242* 05/31/2013 0354   ALT 426* 05/31/2013 0354   ALKPHOS 226* 05/31/2013 0354   BILITOT 6.7* 05/31/2013 0354   GFRNONAA 89* 05/30/2013 0410   GFRAA >90 05/30/2013 0410   Lipase     Component Value Date/Time   LIPASE 13 05/31/2013 0354       Studies/Results: US Abdomen Complete  05/29/2013   *RADIOLOGY REPORT*  Clinical Data:  Abdominal pain.  Nausea and vomiting.  COMPLETE ABDOMINAL ULTRASOUND  Comparison:  No priors.  Findings:   Gallbladder:  Multiple echogenic foci with posterior acoustic shadowing within the lumen of the gallbladder, compatible with gallstones.  Gallbladder wall thickness is slightly increased at 6.3 mm.  No abnormal pericholecystic fluid.  Per report from the sonographer, there was a positive sonographic Murphy's sign on examination.  Common bile duct:  Mildly dilated measuring up to 8.9 mm in the porta hepatis.  Liver:  Diffusely echogenic hepatic parenchyma, suggestive of hepatic steatosis.  No definite focal cystic or solid hepatic lesions.  Probable mild intrahepatic biliary ductal dilatation.  IVC:  Cannot be visualized secondary to overlying bowel gas.  Pancreas:  Cannot be visualized secondary to overlying bowel gas.  Spleen:  Normal size and echotexture without focal parenchymal abnormality.8.0 cm in length.  Right Kidney:  No hydronephrosis.  Well-preserved cortex.  Normal size and parenchymal echotexture. 11.3 cm in length.  2.5 x 2.6 x 2.8 cm anechoic lesion with increased through transmission in the interpolar region of the kidney compatible with a simple cyst.  Left Kidney:  Poorly visualized secondary to body habitus.  No hydronephrosis.  Well-preserved cortex.  Normal size and parenchymal echotexture without focal abnormalities. 11.5 cm in length.  Abdominal aorta:  Atherosclerotic measuring up to 2.1 cm in  diameter proximally, and tapers appropriately distally.  IMPRESSION: 1.  Study is positive for cholelithiasis, and given the gallbladder wall thickening and positive Murphy's sign, it is highly concerning for acute cholecystitis.  Additionally, there is some mild dilatation of the common bile duct and probable intrahepatic biliary ductal dilatation, which increases the likelihood of choledocholithiasis and biliary tract obstruction. Surgical consultation is recommended. 2.  Simple cyst in the right kidney. 3.  Atherosclerosis.   Original Report Authenticated By: Vinnie Langton, M.D.   Mr 3d Recon At  Scanner  05/30/2013   CLINICAL DATA:  Right upper quadrant pain. Nausea and vomiting. Gallstones and biliary dilatation seen on ultrasound.  EXAM: MRI ABDOMEN WITHOUT AND WITH CONTRAST (INCLUDING MRCP)  TECHNIQUE: Multiplanar multisequence MR imaging of the abdomen was performed both before and after the administration of intravenous contrast. Heavily T2-weighted images of the biliary and pancreatic ducts were obtained, and three-dimensional MRCP images were rendered by post processing.  CONTRAST:  14mL MULTIHANCE GADOBENATE DIMEGLUMINE 529 MG/ML IV SOLN  COMPARISON:  Abdomen ultrasound on 05/29/2013  FINDINGS: Tiny gallstones are demonstrated. Gallbladder is mildly distended and there is mild diffuse gallbladder wall thickening, but no evidence of significant pericholecystic inflammatory changes or fluid. Acute cholecystitis cannot be excluded.  Mild diffuse biliary ductal dilatation is seen, with the common duct measuring 10 mm in diameter. There is a tiny filling defect seen in the distal CBD adjacent to the ampulla on the thin slab coronal MPRs, although this cannot definitely be confirmed on the axial plane images. This is suspicious for a small distal common bile duct stone.  No liver masses are identified. There is no evidence of pancreatic ductal dilatation or pancreatic mass. No evidence of peripancreatic inflammatory changes or fluid collections. The spleen, adrenal glands, and left kidney are normal in appearance. A small right posterior renal cyst is seen however there is no evidence of renal masses or hydronephrosis.  No lymphadenopathy or other soft tissue masses identified within the abdomen. No abnormal fluid collections are seen and there is no evidence of dilated bowel loops.  IMPRESSION: 1. Cholelithiasis with mild gallbladder distention and wall thickening; acute cholecystitis cannot be excluded. 2. Mild diffuse biliary dilatation. Suspect small distal common bile duct stone at the ampulla on  thin slab coronal MRCP images.   Electronically Signed   By: Earle Gell   On: 05/30/2013 16:07   Mr Abd W/wo Cm/mrcp  05/30/2013   CLINICAL DATA:  Right upper quadrant pain. Nausea and vomiting. Gallstones and biliary dilatation seen on ultrasound.  EXAM: MRI ABDOMEN WITHOUT AND WITH CONTRAST (INCLUDING MRCP)  TECHNIQUE: Multiplanar multisequence MR imaging of the abdomen was performed both before and after the administration of intravenous contrast. Heavily T2-weighted images of the biliary and pancreatic ducts were obtained, and three-dimensional MRCP images were rendered by post processing.  CONTRAST:  65mL MULTIHANCE GADOBENATE DIMEGLUMINE 529 MG/ML IV SOLN  COMPARISON:  Abdomen ultrasound on 05/29/2013  FINDINGS: Tiny gallstones are demonstrated. Gallbladder is mildly distended and there is mild diffuse gallbladder wall thickening, but no evidence of significant pericholecystic inflammatory changes or fluid. Acute cholecystitis cannot be excluded.  Mild diffuse biliary ductal dilatation is seen, with the common duct measuring 10 mm in diameter. There is a tiny filling defect seen in the distal CBD adjacent to the ampulla on the thin slab coronal MPRs, although this cannot definitely be confirmed on the axial plane images. This is suspicious for a small distal common bile duct stone.  No liver  masses are identified. There is no evidence of pancreatic ductal dilatation or pancreatic mass. No evidence of peripancreatic inflammatory changes or fluid collections. The spleen, adrenal glands, and left kidney are normal in appearance. A small right posterior renal cyst is seen however there is no evidence of renal masses or hydronephrosis.  No lymphadenopathy or other soft tissue masses identified within the abdomen. No abnormal fluid collections are seen and there is no evidence of dilated bowel loops.  IMPRESSION: 1. Cholelithiasis with mild gallbladder distention and wall thickening; acute cholecystitis cannot  be excluded. 2. Mild diffuse biliary dilatation. Suspect small distal common bile duct stone at the ampulla on thin slab coronal MRCP images.   Electronically Signed   By: Earle Gell   On: 05/30/2013 16:07    Anti-infectives: Anti-infectives   Start     Dose/Rate Route Frequency Ordered Stop   05/29/13 2345  ciprofloxacin (CIPRO) IVPB 400 mg     400 mg 200 mL/hr over 60 Minutes Intravenous 2 times daily 05/29/13 2336     05/29/13 2230  Ampicillin-Sulbactam (UNASYN) 3 g in sodium chloride 0.9 % 100 mL IVPB     3 g 100 mL/hr over 60 Minutes Intravenous  Once 05/29/13 2225 05/29/13 2323       Assessment/Plan  1. choledocholithiasis  2. Cholecystitis 3. Aortic stenosis  Plan: 1. MRCP + for CBD stone along with continued elevation in his bilirubin.  The patient would likely benefit from ERCP.  He remains NPO 2. He has been cleared by cards. 3. Await GI's evaluation  LOS: 2 days    Shaelyn Decarli E 05/31/2013, 9:20 AM Pager: [phone]

## 2013-05-31 NOTE — Progress Notes (Signed)
Subjective:  No chest pain or dyspnea.  Objective:  Vital Signs in the last 24 hours: Temp:  [98.5 F (36.9 C)-99.4 F (37.4 C)] 98.5 F (36.9 C) (06/20 0527) Pulse Rate:  [77-97] 77 (06/20 0527) Resp:  [18-20] 18 (06/20 0527) BP: (127-161)/(61-71) 161/67 mmHg (06/20 0527) SpO2:  [92 %-95 %] 92 % (06/20 0527)  Intake/Output from previous day: 06/19 0701 - 06/20 0700 In: 2202.5 [I.V.:1802.5; IV Piggyback:400] Out: [id number] [Urine:1375] Intake/Output from this shift:    . ciprofloxacin  400 mg Intravenous BID  . enoxaparin (LOVENOX) injection  40 mg Subcutaneous QHS  . lisinopril  10 mg Oral Daily  . metoprolol succinate  50 mg Oral Daily  . niacin  500 mg Oral QHS  . pantoprazole (PROTONIX) IV  40 mg Intravenous QHS  . simvastatin  10 mg Oral q1800   . dextrose 5 % and 0.9% NaCl 75 mL/hr at 05/31/13 0038    Physical Exam: The patient appears to be in no distress. Jaundiced.  Head and neck exam reveals that the pupils are equal and reactive.  The extraocular movements are full.  There is no scleral icterus.  Mouth and pharynx are benign.  No lymphadenopathy.  No carotid bruits.  The jugular venous pressure is normal.  Thyroid is not enlarged or tender.  Chest is clear to percussion and auscultation.  No rales or rhonchi.  Expansion of the chest is symmetrical.  Heart reveals no abnormal lift or heave.  First and second heart sounds are normal.  There is grade 2/6 systolic ejection murmur at base.  The abdomen is soft and nontender.  Bowel sounds are normoactive.  There is no hepatosplenomegaly or mass.  There are no abdominal bruits.  Extremities reveal no phlebitis or edema.  Pedal pulses are good.  There is no cyanosis or clubbing.  Neurologic exam is normal strength and no lateralizing weakness.  No sensory deficits.  Integument reveals no rash  Lab Results:  Recent Labs  05/29/13 2045 05/29/13 2057 05/30/13 0410  WBC 16.0*  --  13.6*  HGB 14.6 15.0 12.6*    PLT 254  --  214    Recent Labs  05/29/13 2057 05/30/13 0410  NA 140 139  K 3.7 4.4  CL 107 104  CO2  --  24  GLUCOSE 152* 152*  BUN 26* 22  CREATININE 1.10 0.83   No results found for this basename: TROPONINI, CK, MB,  in the last 72 hours Hepatic Function Panel  Recent Labs  05/31/13 0354  PROT 6.2  ALBUMIN 2.7*  AST 242*  ALT 426*  ALKPHOS 226*  BILITOT 6.7*  BILIDIR 5.2*  IBILI 1.5*   No results found for this basename: CHOL,  in the last 72 hours No results found for this basename: PROTIME,  in the last 72 hours  Imaging: US Abdomen Complete  05/29/2013   *RADIOLOGY REPORT*  Clinical Data:  Abdominal pain.  Nausea and vomiting.  COMPLETE ABDOMINAL ULTRASOUND  Comparison:  No priors.  Findings:  Gallbladder:  Multiple echogenic foci with posterior acoustic shadowing within the lumen of the gallbladder, compatible with gallstones.  Gallbladder wall thickness is slightly increased at 6.3 mm.  No abnormal pericholecystic fluid.  Per report from the sonographer, there was a positive sonographic Murphy's sign on examination.  Common bile duct:  Mildly dilated measuring up to 8.9 mm in the porta hepatis.  Liver:  Diffusely echogenic hepatic parenchyma, suggestive of hepatic steatosis.  No definite focal cystic  or solid hepatic lesions.  Probable mild intrahepatic biliary ductal dilatation.  IVC:  Cannot be visualized secondary to overlying bowel gas.  Pancreas:  Cannot be visualized secondary to overlying bowel gas.  Spleen:  Normal size and echotexture without focal parenchymal abnormality.8.0 cm in length.  Right Kidney:  No hydronephrosis.  Well-preserved cortex.  Normal size and parenchymal echotexture. 11.3 cm in length.  2.5 x 2.6 x 2.8 cm anechoic lesion with increased through transmission in the interpolar region of the kidney compatible with a simple cyst.  Left Kidney:  Poorly visualized secondary to body habitus.  No hydronephrosis.  Well-preserved cortex.  Normal size  and parenchymal echotexture without focal abnormalities. 11.5 cm in length.  Abdominal aorta:  Atherosclerotic measuring up to 2.1 cm in diameter proximally, and tapers appropriately distally.  IMPRESSION: 1.  Study is positive for cholelithiasis, and given the gallbladder wall thickening and positive Murphy's sign, it is highly concerning for acute cholecystitis.  Additionally, there is some mild dilatation of the common bile duct and probable intrahepatic biliary ductal dilatation, which increases the likelihood of choledocholithiasis and biliary tract obstruction. Surgical consultation is recommended. 2.  Simple cyst in the right kidney. 3.  Atherosclerosis.   Original Report Authenticated By: Vinnie Langton, M.D.   Mr 3d Recon At Scanner  05/30/2013   CLINICAL DATA:  Right upper quadrant pain. Nausea and vomiting. Gallstones and biliary dilatation seen on ultrasound.  EXAM: MRI ABDOMEN WITHOUT AND WITH CONTRAST (INCLUDING MRCP)  TECHNIQUE: Multiplanar multisequence MR imaging of the abdomen was performed both before and after the administration of intravenous contrast. Heavily T2-weighted images of the biliary and pancreatic ducts were obtained, and three-dimensional MRCP images were rendered by post processing.  CONTRAST:  44mL MULTIHANCE GADOBENATE DIMEGLUMINE 529 MG/ML IV SOLN  COMPARISON:  Abdomen ultrasound on 05/29/2013  FINDINGS: Tiny gallstones are demonstrated. Gallbladder is mildly distended and there is mild diffuse gallbladder wall thickening, but no evidence of significant pericholecystic inflammatory changes or fluid. Acute cholecystitis cannot be excluded.  Mild diffuse biliary ductal dilatation is seen, with the common duct measuring 10 mm in diameter. There is a tiny filling defect seen in the distal CBD adjacent to the ampulla on the thin slab coronal MPRs, although this cannot definitely be confirmed on the axial plane images. This is suspicious for a small distal common bile duct stone.   No liver masses are identified. There is no evidence of pancreatic ductal dilatation or pancreatic mass. No evidence of peripancreatic inflammatory changes or fluid collections. The spleen, adrenal glands, and left kidney are normal in appearance. A small right posterior renal cyst is seen however there is no evidence of renal masses or hydronephrosis.  No lymphadenopathy or other soft tissue masses identified within the abdomen. No abnormal fluid collections are seen and there is no evidence of dilated bowel loops.  IMPRESSION: 1. Cholelithiasis with mild gallbladder distention and wall thickening; acute cholecystitis cannot be excluded. 2. Mild diffuse biliary dilatation. Suspect small distal common bile duct stone at the ampulla on thin slab coronal MRCP images.   Electronically Signed   By: Earle Gell   On: 05/30/2013 16:07   Mr Abd W/wo Cm/mrcp  05/30/2013   CLINICAL DATA:  Right upper quadrant pain. Nausea and vomiting. Gallstones and biliary dilatation seen on ultrasound.  EXAM: MRI ABDOMEN WITHOUT AND WITH CONTRAST (INCLUDING MRCP)  TECHNIQUE: Multiplanar multisequence MR imaging of the abdomen was performed both before and after the administration of intravenous contrast. Heavily T2-weighted images  of the biliary and pancreatic ducts were obtained, and three-dimensional MRCP images were rendered by post processing.  CONTRAST:  72mL MULTIHANCE GADOBENATE DIMEGLUMINE 529 MG/ML IV SOLN  COMPARISON:  Abdomen ultrasound on 05/29/2013  FINDINGS: Tiny gallstones are demonstrated. Gallbladder is mildly distended and there is mild diffuse gallbladder wall thickening, but no evidence of significant pericholecystic inflammatory changes or fluid. Acute cholecystitis cannot be excluded.  Mild diffuse biliary ductal dilatation is seen, with the common duct measuring 10 mm in diameter. There is a tiny filling defect seen in the distal CBD adjacent to the ampulla on the thin slab coronal MPRs, although this cannot  definitely be confirmed on the axial plane images. This is suspicious for a small distal common bile duct stone.  No liver masses are identified. There is no evidence of pancreatic ductal dilatation or pancreatic mass. No evidence of peripancreatic inflammatory changes or fluid collections. The spleen, adrenal glands, and left kidney are normal in appearance. A small right posterior renal cyst is seen however there is no evidence of renal masses or hydronephrosis.  No lymphadenopathy or other soft tissue masses identified within the abdomen. No abnormal fluid collections are seen and there is no evidence of dilated bowel loops.  IMPRESSION: 1. Cholelithiasis with mild gallbladder distention and wall thickening; acute cholecystitis cannot be excluded. 2. Mild diffuse biliary dilatation. Suspect small distal common bile duct stone at the ampulla on thin slab coronal MRCP images.   Electronically Signed   By: Earle Gell   On: 05/30/2013 16:07    Cardiac Studies:  Assessment/Plan:  1. Mild aortic stenosis. 2. Cholecystitis  Plan: With severe elevation of his LFTs I have temporarily stopped his simvastatin. Cleared for surgery from cardiac standpoint.  LOS: 2 days    Darlin Coco 05/31/2013, 7:21 AM

## 2013-06-01 ENCOUNTER — Encounter (HOSPITAL_COMMUNITY): Payer: Self-pay | Admitting: Anesthesiology

## 2013-06-01 ENCOUNTER — Inpatient Hospital Stay (HOSPITAL_COMMUNITY): Payer: Medicare Other

## 2013-06-01 ENCOUNTER — Encounter (HOSPITAL_COMMUNITY): Admission: EM | Disposition: A | Discharge status: Home / Self Care | Payer: Self-pay | Source: Home / Self Care

## 2013-06-01 ENCOUNTER — Inpatient Hospital Stay (HOSPITAL_COMMUNITY): Payer: Medicare Other | Admitting: Anesthesiology

## 2013-06-01 DIAGNOSIS — K812 Acute cholecystitis with chronic cholecystitis: Secondary | ICD-10-CM | POA: Diagnosis present

## 2013-06-01 DIAGNOSIS — K8010 Calculus of gallbladder with chronic cholecystitis without obstruction: Secondary | ICD-10-CM

## 2013-06-01 HISTORY — PX: LAPAROSCOPIC CHOLECYSTECTOMY SINGLE PORT: SHX5891

## 2013-06-01 LAB — SURGICAL PCR SCREEN
MRSA, PCR: NEGATIVE
Staphylococcus aureus: POSITIVE — AB

## 2013-06-01 LAB — COMPREHENSIVE METABOLIC PANEL
AST: 142 U/L — ABNORMAL HIGH (ref 0–37)
CO2: 26 mEq/L (ref 19–32)
Calcium: 8.9 mg/dL (ref 8.4–10.5)
Creatinine, Ser: 0.84 mg/dL (ref 0.50–1.35)
GFR calc Af Amer: >90 mL/min (ref >90)
GFR calc non Af Amer: 89 mL/min — ABNORMAL LOW (ref >90)
Total Protein: 5.9 g/dL — ABNORMAL LOW (ref 6.0–8.3)

## 2013-06-01 LAB — CBC
MCH: 29.9 pg (ref 26.0–34.0)
MCHC: 31.9 g/dL (ref 30.0–36.0)
MCV: 94 fL (ref 78.0–100.0)
Platelets: 188 10*3/uL (ref 150–400)
RBC: 3.64 MIL/uL — ABNORMAL LOW (ref 4.22–5.81)

## 2013-06-01 SURGERY — LAPAROSCOPIC CHOLECYSTECTOMY SINGLE SITE
Anesthesia: General | Site: Abdomen | Wound class: Clean Contaminated

## 2013-06-01 MED ORDER — LIP MEDEX EX OINT
1.0000 "application " | TOPICAL_OINTMENT | Freq: Two times a day (BID) | CUTANEOUS | Status: DC
  Administered 2013-06-01 (×2): 1 via TOPICAL
  Filled 2013-06-01: qty 7

## 2013-06-01 MED ORDER — MAGIC MOUTHWASH
15.0000 mL | Freq: Four times a day (QID) | ORAL | Status: DC | PRN
  Filled 2013-06-01: qty 15

## 2013-06-01 MED ORDER — NAPROXEN 500 MG PO TABS
500.0000 mg | ORAL_TABLET | Freq: Two times a day (BID) | ORAL | Status: DC
  Administered 2013-06-01 – 2013-06-02 (×2): 500 mg via ORAL
  Filled 2013-06-01 (×4): qty 1

## 2013-06-01 MED ORDER — KETOROLAC TROMETHAMINE 30 MG/ML IJ SOLN
INTRAMUSCULAR | Status: DC | PRN
  Administered 2013-06-01: 30 mg via INTRAVENOUS

## 2013-06-01 MED ORDER — HYDROMORPHONE HCL PF 1 MG/ML IJ SOLN: 0.2500 mg | INTRAMUSCULAR | Status: DC | PRN

## 2013-06-01 MED ORDER — OXYCODONE HCL 5 MG PO TABS: 5.0000 mg | ORAL_TABLET | ORAL | Status: DC | PRN

## 2013-06-01 MED ORDER — METOPROLOL TARTRATE 1 MG/ML IV SOLN
5.0000 mg | Freq: Four times a day (QID) | INTRAVENOUS | Status: DC | PRN
  Filled 2013-06-01: qty 5

## 2013-06-01 MED ORDER — BUPROPION HCL ER (SR) 150 MG PO TB12
150.0000 mg | ORAL_TABLET | Freq: Two times a day (BID) | ORAL | Status: DC
  Administered 2013-06-01 (×2): 150 mg via ORAL
  Filled 2013-06-01 (×4): qty 1

## 2013-06-01 MED ORDER — CHLORHEXIDINE GLUCONATE CLOTH 2 % EX PADS
6.0000 | MEDICATED_PAD | Freq: Every day | CUTANEOUS | Status: DC
  Administered 2013-06-02: 6 via TOPICAL

## 2013-06-01 MED ORDER — SODIUM CHLORIDE 0.9 % IJ SOLN: 3.0000 mL | INTRAMUSCULAR | Status: DC | PRN

## 2013-06-01 MED ORDER — BISACODYL 10 MG RE SUPP: 10.0000 mg | Freq: Two times a day (BID) | RECTAL | Status: DC | PRN

## 2013-06-01 MED ORDER — NEOSTIGMINE METHYLSULFATE 1 MG/ML IJ SOLN
INTRAMUSCULAR | Status: DC | PRN
  Administered 2013-06-01: 4 mg via INTRAVENOUS

## 2013-06-01 MED ORDER — 0.9 % SODIUM CHLORIDE (POUR BTL) OPTIME
TOPICAL | Status: DC | PRN
  Administered 2013-06-01: 1000 mL

## 2013-06-01 MED ORDER — SUCCINYLCHOLINE CHLORIDE 20 MG/ML IJ SOLN
INTRAMUSCULAR | Status: DC | PRN
  Administered 2013-06-01: 100 mg via INTRAVENOUS

## 2013-06-01 MED ORDER — AMOXICILLIN-POT CLAVULANATE 875-125 MG PO TABS: 1.0000 | ORAL_TABLET | Freq: Two times a day (BID) | ORAL | Status: DC

## 2013-06-01 MED ORDER — LACTATED RINGERS IV BOLUS (SEPSIS): 1000.0000 mL | Freq: Three times a day (TID) | INTRAVENOUS | Status: DC | PRN

## 2013-06-01 MED ORDER — LACTATED RINGERS IV SOLN: INTRAVENOUS | Status: DC

## 2013-06-01 MED ORDER — SODIUM CHLORIDE 0.9 % IJ SOLN
3.0000 mL | Freq: Two times a day (BID) | INTRAMUSCULAR | Status: DC
  Administered 2013-06-01: 3 mL via INTRAVENOUS

## 2013-06-01 MED ORDER — IOHEXOL 300 MG/ML  SOLN
INTRAMUSCULAR | Status: DC | PRN
  Administered 2013-06-01: 50 mL via INTRAVENOUS

## 2013-06-01 MED ORDER — SACCHAROMYCES BOULARDII 250 MG PO CAPS
250.0000 mg | ORAL_CAPSULE | Freq: Two times a day (BID) | ORAL | Status: DC
  Administered 2013-06-01 (×2): 250 mg via ORAL
  Filled 2013-06-01 (×5): qty 1

## 2013-06-01 MED ORDER — ALUM & MAG HYDROXIDE-SIMETH 200-200-20 MG/5ML PO SUSP: 30.0000 mL | Freq: Four times a day (QID) | ORAL | Status: DC | PRN

## 2013-06-01 MED ORDER — LACTATED RINGERS IV SOLN
INTRAVENOUS | Status: DC | PRN
  Administered 2013-06-01: 10:00:00 via INTRAVENOUS

## 2013-06-01 MED ORDER — ASPIRIN EC 81 MG PO TBEC
81.0000 mg | DELAYED_RELEASE_TABLET | Freq: Every day | ORAL | Status: DC
  Filled 2013-06-01: qty 1

## 2013-06-01 MED ORDER — OXYCODONE HCL 5 MG PO TABS
5.0000 mg | ORAL_TABLET | ORAL | Status: DC | PRN
  Administered 2013-06-01 – 2013-06-02 (×2): 5 mg via ORAL
  Filled 2013-06-01 (×2): qty 1

## 2013-06-01 MED ORDER — PROMETHAZINE HCL 25 MG/ML IJ SOLN: 12.5000 mg | Freq: Four times a day (QID) | INTRAMUSCULAR | Status: DC | PRN

## 2013-06-01 MED ORDER — FENTANYL CITRATE 0.05 MG/ML IJ SOLN
INTRAMUSCULAR | Status: DC | PRN
  Administered 2013-06-01 (×2): 50 ug via INTRAVENOUS
  Administered 2013-06-01: 100 ug via INTRAVENOUS
  Administered 2013-06-01: 50 ug via INTRAVENOUS

## 2013-06-01 MED ORDER — PROMETHAZINE HCL 25 MG/ML IJ SOLN: 6.2500 mg | INTRAMUSCULAR | Status: DC | PRN

## 2013-06-01 MED ORDER — CITALOPRAM HYDROBROMIDE 20 MG PO TABS
20.0000 mg | ORAL_TABLET | Freq: Every day | ORAL | Status: DC
Start: 2013-06-01 — End: 2013-06-02
  Administered 2013-06-01: 20 mg via ORAL
  Filled 2013-06-01 (×3): qty 1

## 2013-06-01 MED ORDER — BUPIVACAINE-EPINEPHRINE 0.25% -1:200000 IJ SOLN
INTRAMUSCULAR | Status: DC | PRN
  Administered 2013-06-01: 50 mL

## 2013-06-01 MED ORDER — PROPOFOL 10 MG/ML IV BOLUS
INTRAVENOUS | Status: DC | PRN
  Administered 2013-06-01: 150 mg via INTRAVENOUS

## 2013-06-01 MED ORDER — ROCURONIUM BROMIDE 100 MG/10ML IV SOLN
INTRAVENOUS | Status: DC | PRN
  Administered 2013-06-01: 10 mg via INTRAVENOUS
  Administered 2013-06-01: 30 mg via INTRAVENOUS
  Administered 2013-06-01: 10 mg via INTRAVENOUS

## 2013-06-01 MED ORDER — MUPIROCIN 2 % EX OINT
TOPICAL_OINTMENT | Freq: Two times a day (BID) | CUTANEOUS | Status: DC
  Administered 2013-06-01: 22:00:00 via NASAL
  Filled 2013-06-01: qty 22

## 2013-06-01 MED ORDER — SODIUM CHLORIDE 0.9 % IV SOLN: 3.0000 g | Freq: Four times a day (QID) | INTRAVENOUS | Status: DC

## 2013-06-01 MED ORDER — DIPHENHYDRAMINE HCL 50 MG/ML IJ SOLN: 12.5000 mg | Freq: Four times a day (QID) | INTRAMUSCULAR | Status: DC | PRN

## 2013-06-01 MED ORDER — LACTATED RINGERS IR SOLN
Status: DC | PRN
  Administered 2013-06-01: 2000 mL

## 2013-06-01 MED ORDER — FAMOTIDINE 20 MG PO TABS
20.0000 mg | ORAL_TABLET | Freq: Every day | ORAL | Status: DC
  Administered 2013-06-01: 20 mg via ORAL
  Filled 2013-06-01 (×2): qty 1

## 2013-06-01 MED ORDER — SODIUM CHLORIDE 0.9 % IV SOLN
3.0000 g | INTRAVENOUS | Status: AC
  Administered 2013-06-01: 3 g via INTRAVENOUS

## 2013-06-01 MED ORDER — GLYCOPYRROLATE 0.2 MG/ML IJ SOLN
INTRAMUSCULAR | Status: DC | PRN
  Administered 2013-06-01: .6 mg via INTRAVENOUS

## 2013-06-01 MED ORDER — LABETALOL HCL 5 MG/ML IV SOLN
INTRAVENOUS | Status: DC | PRN
  Administered 2013-06-01 (×3): 5 mg via INTRAVENOUS

## 2013-06-01 MED ORDER — ONDANSETRON HCL 4 MG/2ML IJ SOLN
INTRAMUSCULAR | Status: DC | PRN
  Administered 2013-06-01: 4 mg via INTRAVENOUS

## 2013-06-01 MED ORDER — DEXAMETHASONE SODIUM PHOSPHATE 10 MG/ML IJ SOLN
INTRAMUSCULAR | Status: DC | PRN
  Administered 2013-06-01: 10 mg via INTRAVENOUS

## 2013-06-01 SURGICAL SUPPLY — 47 items
APPLIER CLIP ROT 10 11.4 M/L (STAPLE)
APPLIER CLIP UNV 5X34 EPIX (ENDOMECHANICALS) ×2 IMPLANT
CABLE HIGH FREQUENCY MONO STRZ (ELECTRODE) ×2 IMPLANT
CANISTER SUCTION 2500CC (MISCELLANEOUS) ×2 IMPLANT
CLIP APPLIE ROT 10 11.4 M/L (STAPLE) IMPLANT
CLOTH BEACON ORANGE TIMEOUT ST (SAFETY) ×2 IMPLANT
COVER MAYO STAND STRL (DRAPES) ×2 IMPLANT
DECANTER SPIKE VIAL GLASS SM (MISCELLANEOUS) ×2 IMPLANT
DRAIN CHANNEL 19F RND (DRAIN) IMPLANT
DRAPE C-ARM 42X120 X-RAY (DRAPES) ×2 IMPLANT
DRAPE LAPAROSCOPIC ABDOMINAL (DRAPES) ×2 IMPLANT
DRAPE WARM FLUID 44X44 (DRAPE) ×2 IMPLANT
DRSG TEGADERM 4X4.75 (GAUZE/BANDAGES/DRESSINGS) ×4 IMPLANT
ELECT REM PT RETURN 9FT ADLT (ELECTROSURGICAL) ×2
ELECTRODE REM PT RTRN 9FT ADLT (ELECTROSURGICAL) ×1 IMPLANT
ENDOLOOP SUT PDS II  0 18 (SUTURE) ×2
ENDOLOOP SUT PDS II 0 18 (SUTURE) ×2 IMPLANT
EVACUATOR SILICONE 100CC (DRAIN) IMPLANT
GAUZE SPONGE 2X2 8PLY STRL LF (GAUZE/BANDAGES/DRESSINGS) ×1 IMPLANT
GLOVE BIOGEL PI IND STRL 7.0 (GLOVE) ×1 IMPLANT
GLOVE BIOGEL PI INDICATOR 7.0 (GLOVE) ×1
GLOVE ECLIPSE 8.0 STRL XLNG CF (GLOVE) ×2 IMPLANT
GLOVE INDICATOR 8.0 STRL GRN (GLOVE) ×2 IMPLANT
GOWN STRL NON-REIN LRG LVL3 (GOWN DISPOSABLE) IMPLANT
GOWN STRL REIN XL XLG (GOWN DISPOSABLE) ×8 IMPLANT
IV LACTATED RINGER IRRG 3000ML (IV SOLUTION)
IV LR IRRIG 3000ML ARTHROMATIC (IV SOLUTION) IMPLANT
KIT BASIN OR (CUSTOM PROCEDURE TRAY) ×2 IMPLANT
NS IRRIG 1000ML POUR BTL (IV SOLUTION) ×2 IMPLANT
POUCH SPECIMEN RETRIEVAL 10MM (ENDOMECHANICALS) ×2 IMPLANT
SCALPEL HARMONIC ACE (MISCELLANEOUS) ×2 IMPLANT
SCISSORS LAP 5X35 DISP (ENDOMECHANICALS) ×2 IMPLANT
SET CHOLANGIOGRAPH MIX (MISCELLANEOUS) ×2 IMPLANT
SET IRRIG TUBING LAPAROSCOPIC (IRRIGATION / IRRIGATOR) ×2 IMPLANT
SLEEVE XCEL OPT CAN 5 100 (ENDOMECHANICALS) ×2 IMPLANT
SOLUTION ANTI FOG 6CC (MISCELLANEOUS) IMPLANT
SPONGE GAUZE 2X2 STER 10/PKG (GAUZE/BANDAGES/DRESSINGS) ×1
STRIP CLOSURE SKIN 1/2X4 (GAUZE/BANDAGES/DRESSINGS) IMPLANT
SUT MNCRL AB 4-0 PS2 18 (SUTURE) ×2 IMPLANT
SUT PDS AB 0 CT1 36 (SUTURE) ×6 IMPLANT
SUT VICRYL 0 TIES 12 18 (SUTURE) IMPLANT
TOWEL OR 17X26 10 PK STRL BLUE (TOWEL DISPOSABLE) ×2 IMPLANT
TOWEL OR NON WOVEN STRL DISP B (DISPOSABLE) ×2 IMPLANT
TRAY LAP CHOLE (CUSTOM PROCEDURE TRAY) ×2 IMPLANT
TROCAR 5M 150ML BLDLS (TROCAR) ×2 IMPLANT
TROCAR XCEL NON-BLD 5MMX100MML (ENDOMECHANICALS) ×2 IMPLANT
TUBING INSUFFLATION 10FT LAP (TUBING) ×2 IMPLANT

## 2013-06-01 NOTE — Anesthesia Preprocedure Evaluation (Signed)
Anesthesia Evaluation  Patient identified by MRN, date of birth, ID band Patient awake    Reviewed: Allergy & Precautions, H&P , NPO status , Patient's Chart, lab work & pertinent test results  Airway Mallampati: II TM Distance: >3 FB Neck ROM: Full    Dental  (+) Teeth Intact, Poor Dentition and Partial Lower   Pulmonary sleep apnea , COPDformer smoker,  breath sounds clear to auscultation  Pulmonary exam normal       Cardiovascular hypertension, Pt. on medications + Valvular Problems/Murmurs MR Rhythm:Regular Rate:Normal     Neuro/Psych CVA, No Residual Symptoms negative neurological ROS  negative psych ROS   GI/Hepatic negative GI ROS, Neg liver ROS,   Endo/Other  Morbid obesity  Renal/GU negative Renal ROS  negative genitourinary   Musculoskeletal negative musculoskeletal ROS (+)   Abdominal (+) + obese,   Peds negative pediatric ROS (+)  Hematology negative hematology ROS (+)   Anesthesia Other Findings   Reproductive/Obstetrics                           Anesthesia Physical Anesthesia Plan  ASA: III and emergent  Anesthesia Plan: General   Post-op Pain Management:    Induction: Intravenous and Rapid sequence  Airway Management Planned: Oral ETT  Additional Equipment:   Intra-op Plan:   Post-operative Plan: Extubation in OR  Informed Consent: I have reviewed the patients History and Physical, chart, labs and discussed the procedure including the risks, benefits and alternatives for the proposed anesthesia with the patient or authorized representative who has indicated his/her understanding and acceptance.   Dental advisory given  Plan Discussed with: CRNA  Anesthesia Plan Comments:         Anesthesia Quick Evaluation

## 2013-06-01 NOTE — Preoperative (Addendum)
Beta Blockers   Reason not to administer Beta Blockers:Not Applicable NPO, given intraop

## 2013-06-01 NOTE — Anesthesia Postprocedure Evaluation (Signed)
Anesthesia Post Note  Patient: Brandon [name]  Procedure(s) Performed: Procedure(s) (LRB): LAPAROSCOPIC CHOLECYSTECTOMY SINGLE PORT (N/A)  Anesthesia type: General  Patient location: PACU  Post pain: Pain level controlled  Post assessment: Post-op Vital signs reviewed  Last Vitals:  Filed Vitals:   06/01/13 1615  BP: 124/67  Pulse: 75  Temp: 36.7 C  Resp: 18    Post vital signs: Reviewed  Level of consciousness: sedated  Complications: No apparent anesthesia complications

## 2013-06-01 NOTE — Op Note (Signed)
05/29/2013 - 06/01/2013  11:52 AM  PATIENT:  Brandon [name]  67 y.o. male  Patient Care Team: Carola Mitzi Davenport, MD as PCP - General (Family Medicine)  PRE-OPERATIVE DIAGNOSIS:  cholecystitis  POST-OPERATIVE DIAGNOSIS:  Acute on chronic cholecystitis Jaundice due to transient choledocolithiasis  PROCEDURE:  Procedure(s): LAPAROSCOPIC CHOLECYSTECTOMY   SURGEON:  Surgeon(s): Adin Hector, MD  ASSISTANT: RN   ANESTHESIA:   local and general  EBL:  Total I/O In: 900 [I.V.:900] Out: 50 [Blood:50]  Delay start of Pharmacological VTE agent (>24hrs) due to surgical blood loss or risk of bleeding:  no  DRAINS: (19) Blake drain(s) in the RUQ/gallbladder fossa   SPECIMEN:  Source of Specimen:  Gallbladder  DISPOSITION OF SPECIMEN:  PATHOLOGY  COUNTS:  YES  PLAN OF CARE: Admit to inpatient   PATIENT DISPOSITION:  PACU - hemodynamically stable.  INDICATION: Pleasant obese male.  Found to have jaundice and abdominal pain.  Had workup in ERCP with no common bile duct stones.  Inflamed gallbladder.  Cardiology saw him for history of aortic stenosis.  Felt to be mild.  Felt to be low operative risk.  I felt he would benefit from cholecystectomy.  Had long discussion with the patient and his wife.  They understand with the prolonged course and inflammation, there is a high-risk of need for drain/tube/conversion to open/percutaneous drainage.  The anatomy & physiology of hepatobiliary & pancreatic function was discussed.  The pathophysiology of gallbladder dysfunction was discussed.  Natural history risks without surgery was discussed.   I feel the risks of no intervention will lead to serious problems that outweigh the operative risks; therefore, I recommended cholecystectomy to remove the pathology.  I explained laparoscopic techniques with possible need for an open approach.  Probable cholangiogram to evaluate the bilary tract was explained as well.    Risks such as bleeding,  infection, abscess, leak, injury to other organs, need for further treatment, heart attack, death, and other risks were discussed.  I noted a good likelihood this will help address the problem.  Possibility that this will not correct all abdominal symptoms was explained.  Goals of post-operative recovery were discussed as well.  We will work to minimize complications.  An educational handout further explaining the pathology and treatment options was given as well.  Questions were answered.  The patient expresses understanding & wishes to proceed with surgery.   OR FINDINGS: Trunk an inflamed gallbladder with significant thickening.  Some abscess formation on posterior wall.  Very short inflamed cystic duct.  Cholangiogram not done due to this.  DESCRIPTION:   The patient was identified & brought in the operating room. The patient was positioned supine with arms tucked. SCDs were active during the entire case. The patient underwent general anesthesia without any difficulty.  The abdomen was prepped and draped in a sterile fashion. A Surgical Timeout confirmed our plan.  I made a transverse curvilinear incision through the superior umbilical fold.  I placed a 35mm long port through the supraumbilical fascia using a modified Hassan cutdown technique. I began carbon dioxide insufflation. Camera inspection revealed no injury. There were no adhesions to the anterior abdominal wall supraumbilically.  I proceeded to continue with single site technique. I placed a #5 port in left upper aspect of the wound. I placed a 5 mm atraumatic grasper in the right inferior aspect of the wound.  I turned attention to the right upper quadrant.  The dome the gallbladder could be seen.  The gallbladder  fundus was elevated cephalad.  I freed the greater omentum and mesial: And duodenal adhesions off using focused blunt and harmonic dissection.  I freed the peritoneal coverings between the gallbladder and the liver on the  posteriolateral and anteriomedial walls. I alternated between Harmonic & blunt Maryland dissection to help get a good critical view of the cystic artery and cystic duct. I did further dissection to free a few centimeters of the  gallbladder off the liver bed to get a good critical view of the infundibulum and cystic duct. I mobilized the cystic artery; and, after getting a good 360 view, ligated the cystic artery using the Harmonic ultrasonic dissection. I skeletonized the cystic duct.  The cystic duct was very short.    I switched over to a dome down technique.  I freed the gallbladder from its attachments on the gallbladder fossa and so all that remained was the infundibulum and short cystic duct.  I decided this was too short and inflamed to attempt cholangiography.  The negative ERCP seemed to help the side and not forcing the issue.  I ligated the cystic duct using 0 PDS Endoloop x2.  I transected the gallbladder off the cystic duct using the harmonic scalpel across the infundibulum.  I placed the gallbladder Endo Catch bag.  There was some spillage of stones.  I was able to raise them in the gallbladder Endo Catch bag.  I did copious irrigation of 2 L.  I assured hemostasis on the gallbladder fossa.  I ensured hemostasis on the gallbladder fossa of the liver and elsewhere. I inspected the rest of the abdomen & detected no injury nor bleeding elsewhere.  I removed the gallbladder out the supraumbilical fascia An Endo Catch bag.   I placed a 43 French drain so it exited in the right upper quadrant.  Delay in the gallbladder fossa and Morison's pouch.  I closed her that using nylon stitch.  I closed the fascia transversely using 0 PDS interrupted stitches. I closed the skin using 4-0 monocryl stitch.  Sterile dressing was applied. The patient was extubated & arrived in the PACU in stable condition..  I had discussed postoperative care with the patient in the holding area. I discussed the patient's status  to the family.  Questions were answered.  They expressed understanding & appreciation.  Instructions are written in the chart as well.

## 2013-06-01 NOTE — Progress Notes (Signed)
Pt's IV pulled out. Pt did not want to be stuck again. Pt has voided 225 in the last 4 hrs. Will continue to monitor. Brandon [name]

## 2013-06-01 NOTE — Progress Notes (Signed)
Dr. Marcello Moores aware pt surgical PCR staph aureus positive. See new orders for nasal ointment and chg baths.

## 2013-06-01 NOTE — Progress Notes (Signed)
Patient ID: Brandon [name], male   DOB: 01-29-1946, 67 y.o.   MRN: [id number] [facility] Gastroenterology Progress Note  Brandon [name] 67 y.o. 1946-08-06   Subjective: Patient walking in halls without assistance. S/P Lap Chole today. S/P ERCP yesterday. Feels good. Tolerating clears. Denies abdominal pain  Objective: Vital signs: Filed Vitals:   06/01/13 1615  BP: 124/67  Pulse: 75  Temp: 98.1 F (36.7 C)  Resp: 18    Physical Exam: Gen: alert, no acute distress, obese Abd: soft, nontender, nondistended  Lab Results:  Recent Labs  05/30/13 0410 06/01/13 0535  NA 139 139  K 4.4 3.5  CL 104 104  CO2 24 26  GLUCOSE 152* 126*  BUN 22 13  CREATININE 0.83 0.84  CALCIUM 9.0 8.9    Recent Labs  05/31/13 0354 06/01/13 0535  AST 242* 142*  ALT 426* 323*  ALKPHOS 226* 214*  BILITOT 6.7* 3.6*  PROT 6.2 5.9*  ALBUMIN 2.7* 2.5*    Recent Labs  05/29/13 2045  05/30/13 0410 06/01/13 0535  WBC 16.0*  --  13.6* 7.3  NEUTROABS 13.3*  --   --   --   HGB 14.6  < > 12.6* 10.9*  HCT 41.5  < > 37.8* 34.2*  MCV 91.8  --  93.6 94.0  PLT 254  --  214 188  < > = values in this interval not displayed.    Assessment/Plan: Acute cholecystitis and elevated LFTs that are improving. S/P Lap chole today (IOC not done due to "very short inflamed cystic duct"). Likely passed a stone prior to ERCP that was done yesterday. Doing well postop. Defer diet and d/c timing to CCS. F/U prn with GI. Will sign off. Call if questions.   [place] C. 06/01/2013, 8:36 PM

## 2013-06-01 NOTE — Progress Notes (Addendum)
Brandon [name] [id number] [birth year]  CARE TEAM:  PCP: Hulen Shouts, MD  Outpatient Care Team: Patient Care Team: Carola Mitzi Davenport, MD as PCP - General (Family Medicine)  Inpatient Treatment Team: Treatment Team: Attending Provider: Nolon Nations, MD; Registered Nurse: Rise Patience, RN; Consulting Physician: Michae Kava Lbcardiology, MD; Consulting Physician: Lear Ng, MD; Registered Nurse: Sue Lush; Technician: Patience Musca, NT; Technician: Bonnye Fava, NT; Technician: Rondel Jumbo, NT; Registered Nurse: Derek Jack, RN   Subjective:  ERCP done - no CBD stone found Wife in room Pt feels much better overall No n/v Pain less   Objective:  Vital signs:  Filed Vitals:   05/31/13 2004 05/31/13 2200 06/01/13 0140 06/01/13 0500  BP: 120/67 137/68 189/71 108/66  Pulse: 72 85 97 64  Temp: 97.5 F (36.4 C) 98.6 F (37 C) 98.4 F (36.9 C) 98.6 F (37 C)  TempSrc: Oral Oral Oral Oral  Resp: 16 18 18 18   Height:      Weight:      SpO2: 94% 97% 96% 95%    Last BM Date: 05/27/13  Intake/Output   Yesterday:  06/20 0701 - 06/21 0700 In: 2200 [I.V.:1800; IV Piggyback:400] Out: 850 [Urine:850] This shift:     Bowel function:  Flatus: y  BM: n  Drain: n/a  Physical Exam:  General: Pt awake/alert/oriented x4 in no acute distress Eyes: PERRL, normal EOM.  Sclera clear.  No icterus Neuro: CN II-XII intact w/o focal sensory/motor deficits. Lymph: No head/neck/groin lymphadenopathy Psych:  No delerium/psychosis/paranoia HENT: Normocephalic, Mucus membranes moist.  No thrush Neck: Supple, No tracheal deviation Chest: No chest wall pain w good excursion CV:  Pulses intact.  Regular rhythm MS: Normal AROM mjr joints.  No obvious deformity Abdomen: Obese.  Soft.  Nondistended.  Mildly tender at RUQ.  No evidence of peritonitis.  No incarcerated hernias. Ext:  SCDs BLE.  No mjr edema.  No cyanosis Skin: No petechiae  / purpura   Problem List:   Active Problems:   Calculus of bile duct without mention of cholecystitis or obstruction   Nonspecific elevation of levels of transaminase or lactic acid dehydrogenase (LDH)   Nonspecific (abnormal) findings on radiological and other examination of biliary tract   Assessment  Brandon [name]  68 y.o. male  1 Day Post-Op  Procedure(s): ENDOSCOPIC RETROGRADE CHOLANGIOPANCREATOGRAPHY (ERCP)  Cholecystitis clinically stabilizing with IV ABx & bowel rest  Improving Jaundice & neg ERCP most c/w transient CBD stones  Plan:  -lap chole:  While pt has had Sx for a while & risk of significant adhesions/conversion to open is higher, there is no definite contraindication to tryin to see if can do lap chole & resolve issue.  Cleared by cardiology.  Good exercise tolerance (walks 1-2 miles at home w/o problems).  If anatomy horrible, abort chole & have perc drain done post op but hopefully can avoid this.  Pt & wife wish to be aggressive & try surgery 1st  The anatomy & physiology of the anorectal region was discussed.  The pathophysiology of anal fissure and differential diagnosis was discussed.  Natural history progression  was discussed.   I stressed the importance of a bowel regimen to have daily soft bowel movements to minimize progression of disease.     The patient's condition is not adequately controlled.  Non-operative treatment has not healed the fissure.  Therefore, I recommended examination under anesthesia for better examination to confirm the diagnosis and treat by  lateral internal sphincterotomy to relax the spasm better & allow the fissure to heal.  Technique, benefits, alternatives were discussed.   I noted a good likelihood this will help address the problem.  Risks such as bleeding, pain, incontinence, recurrence, heart attack, death, and other risks were discussed.    Educational handouts further explaining the pathology, treatment options, and bowel  regimen were given as well.  The patient expressed understanding & wishes to proceed with surgery.    -control HTN -B blocker PRN periop -change to Unasyn -VTE prophylaxis- SCDs, etc -mobilize as tolerated to help recovery  Adin Hector, M.D., F.A.C.S. Gastrointestinal and Minimally Invasive Surgery [facility] [address] [place], [place] [address] [phone] Main / Paging   06/01/2013   Results:   Labs: Results for orders placed during the hospital encounter of 05/29/13 (from the past 48 hour(s))  HEPATIC FUNCTION PANEL     Status: Abnormal   Collection Time    05/31/13  3:54 AM      Result Value Range   Total Protein 6.2  6.0 - 8.3 g/dL   Albumin 2.7 (*) 3.5 - 5.2 g/dL   AST 242 (*) 0 - 37 U/L   ALT 426 (*) 0 - 53 U/L   Alkaline Phosphatase 226 (*) 39 - 117 U/L   Total Bilirubin 6.7 (*) 0.3 - 1.2 mg/dL   Bilirubin, Direct 5.2 (*) 0.0 - 0.3 mg/dL   Comment: ICTERIC SPECIMEN   Indirect Bilirubin 1.5 (*) 0.3 - 0.9 mg/dL  LIPASE, BLOOD     Status: None   Collection Time    05/31/13  3:54 AM      Result Value Range   Lipase 13  11 - 59 U/L  CBC     Status: Abnormal   Collection Time    06/01/13  5:35 AM      Result Value Range   WBC 7.3  4.0 - 10.5 K/uL   RBC 3.64 (*) 4.22 - 5.81 MIL/uL   Hemoglobin 10.9 (*) 13.0 - 17.0 g/dL   HCT 34.2 (*) 39.0 - 52.0 %   MCV 94.0  78.0 - 100.0 fL   MCH 29.9  26.0 - 34.0 pg   MCHC 31.9  30.0 - 36.0 g/dL   RDW 13.8  11.5 - 15.5 %   Platelets 188  150 - 400 K/uL  COMPREHENSIVE METABOLIC PANEL     Status: Abnormal   Collection Time    06/01/13  5:35 AM      Result Value Range   Sodium 139  135 - 145 mEq/L   Potassium 3.5  3.5 - 5.1 mEq/L   Chloride 104  96 - 112 mEq/L   CO2 26  19 - 32 mEq/L   Glucose, Bld 126 (*) 70 - 99 mg/dL   BUN 13  6 - 23 mg/dL   Creatinine, Ser 0.84  0.50 - 1.35 mg/dL   Calcium 8.9  8.4 - 10.5 mg/dL   Total Protein 5.9 (*) 6.0 - 8.3 g/dL   Albumin 2.5 (*)  3.5 - 5.2 g/dL   AST 142 (*) 0 - 37 U/L   ALT 323 (*) 0 - 53 U/L   Alkaline Phosphatase 214 (*) 39 - 117 U/L   Total Bilirubin 3.6 (*) 0.3 - 1.2 mg/dL   GFR calc non Af Amer 89 (*) >90 mL/min   GFR calc Af Amer >90  >90 mL/min   Comment:  The eGFR has been calculated     using the CKD EPI equation.     This calculation has not been     validated in all clinical     situations.     eGFR's persistently     <90 mL/min signify     possible Chronic Kidney Disease.    Imaging / Studies: Mr 3d Recon At Scanner  2013/06/19   CLINICAL DATA:  Right upper quadrant pain. Nausea and vomiting. Gallstones and biliary dilatation seen on ultrasound.  EXAM: MRI ABDOMEN WITHOUT AND WITH CONTRAST (INCLUDING MRCP)  TECHNIQUE: Multiplanar multisequence MR imaging of the abdomen was performed both before and after the administration of intravenous contrast. Heavily T2-weighted images of the biliary and pancreatic ducts were obtained, and three-dimensional MRCP images were rendered by post processing.  CONTRAST:  73mL MULTIHANCE GADOBENATE DIMEGLUMINE 529 MG/ML IV SOLN  COMPARISON:  Abdomen ultrasound on 05/29/2013  FINDINGS: Tiny gallstones are demonstrated. Gallbladder is mildly distended and there is mild diffuse gallbladder wall thickening, but no evidence of significant pericholecystic inflammatory changes or fluid. Acute cholecystitis cannot be excluded.  Mild diffuse biliary ductal dilatation is seen, with the common duct measuring 10 mm in diameter. There is a tiny filling defect seen in the distal CBD adjacent to the ampulla on the thin slab coronal MPRs, although this cannot definitely be confirmed on the axial plane images. This is suspicious for a small distal common bile duct stone.  No liver masses are identified. There is no evidence of pancreatic ductal dilatation or pancreatic mass. No evidence of peripancreatic inflammatory changes or fluid collections. The spleen, adrenal glands, and left  kidney are normal in appearance. A small right posterior renal cyst is seen however there is no evidence of renal masses or hydronephrosis.  No lymphadenopathy or other soft tissue masses identified within the abdomen. No abnormal fluid collections are seen and there is no evidence of dilated bowel loops.  IMPRESSION: 1. Cholelithiasis with mild gallbladder distention and wall thickening; acute cholecystitis cannot be excluded. 2. Mild diffuse biliary dilatation. Suspect small distal common bile duct stone at the ampulla on thin slab coronal MRCP images.   Electronically Signed   By: Earle Gell   On: 06/19/2013 16:07   Dg Ercp Biliary & Pancreatic Ducts  05/31/2013   *RADIOLOGY REPORT*  Clinical Data: Choledocholithiasis  ERCP  Comparison:  MRCP 06-19-13  Technique:  Multiple spot images obtained with the fluoroscopic device and submitted for interpretation post-procedure.  ERCP was performed by Dr. Lucio Edward.  Fluoroscopy time:  2 minutes 24 seconds reported  Findings: A total of 15 intraoperative spot fluoroscopic films demonstrate an endoscope in the descending duodenum with retrograde cannulation of the common bile duct and retrograde opacification of the biliary tree.  There is mild biliary ductal dilatation.  Focal filling defect in the distal common duct suggestive of choledocholithiasis.  The images demonstrate balloon sweep of the biliary tree.  IMPRESSION: ERCP, sphincterotomy and balloon sweep of the common bile duct as above.  These images were submitted for radiologic interpretation only. Please see the procedural report for the amount of contrast and the fluoroscopy time utilized.   Original Report Authenticated By: Jacqulynn Cadet, M.D.   Mr Abd W/wo Cm/mrcp  2013/06/19   CLINICAL DATA:  Right upper quadrant pain. Nausea and vomiting. Gallstones and biliary dilatation seen on ultrasound.  EXAM: MRI ABDOMEN WITHOUT AND WITH CONTRAST (INCLUDING MRCP)  TECHNIQUE: Multiplanar multisequence  MR imaging of the abdomen was performed  both before and after the administration of intravenous contrast. Heavily T2-weighted images of the biliary and pancreatic ducts were obtained, and three-dimensional MRCP images were rendered by post processing.  CONTRAST:  84mL MULTIHANCE GADOBENATE DIMEGLUMINE 529 MG/ML IV SOLN  COMPARISON:  Abdomen ultrasound on 05/29/2013  FINDINGS: Tiny gallstones are demonstrated. Gallbladder is mildly distended and there is mild diffuse gallbladder wall thickening, but no evidence of significant pericholecystic inflammatory changes or fluid. Acute cholecystitis cannot be excluded.  Mild diffuse biliary ductal dilatation is seen, with the common duct measuring 10 mm in diameter. There is a tiny filling defect seen in the distal CBD adjacent to the ampulla on the thin slab coronal MPRs, although this cannot definitely be confirmed on the axial plane images. This is suspicious for a small distal common bile duct stone.  No liver masses are identified. There is no evidence of pancreatic ductal dilatation or pancreatic mass. No evidence of peripancreatic inflammatory changes or fluid collections. The spleen, adrenal glands, and left kidney are normal in appearance. A small right posterior renal cyst is seen however there is no evidence of renal masses or hydronephrosis.  No lymphadenopathy or other soft tissue masses identified within the abdomen. No abnormal fluid collections are seen and there is no evidence of dilated bowel loops.  IMPRESSION: 1. Cholelithiasis with mild gallbladder distention and wall thickening; acute cholecystitis cannot be excluded. 2. Mild diffuse biliary dilatation. Suspect small distal common bile duct stone at the ampulla on thin slab coronal MRCP images.   Electronically Signed   By: Earle Gell   On: 05/30/2013 16:07    Medications / Allergies: per chart  Antibiotics: Anti-infectives   Start     Dose/Rate Route Frequency Ordered Stop   06/01/13 0845   Ampicillin-Sulbactam (UNASYN) 3 g in sodium chloride 0.9 % 100 mL IVPB     3 g 100 mL/hr over 60 Minutes Intravenous Every 6 hours 06/01/13 0833     05/29/13 2345  ciprofloxacin (CIPRO) IVPB 400 mg  Status:  Discontinued     400 mg 200 mL/hr over 60 Minutes Intravenous 2 times daily 05/29/13 2336 06/01/13 0833   05/29/13 2230  Ampicillin-Sulbactam (UNASYN) 3 g in sodium chloride 0.9 % 100 mL IVPB     3 g 100 mL/hr over 60 Minutes Intravenous  Once 05/29/13 2225 05/29/13 2323

## 2013-06-01 NOTE — Transfer of Care (Signed)
Immediate Anesthesia Transfer of Care Note  Patient: Brandon [name]  Procedure(s) Performed: Procedure(s): LAPAROSCOPIC CHOLECYSTECTOMY SINGLE PORT (N/A)  Patient Location: PACU  Anesthesia Type:General  Level of Consciousness: awake, alert  and patient cooperative  Airway & Oxygen Therapy: Patient Spontanous Breathing and Patient connected to face mask oxygen  Post-op Assessment: Report given to PACU RN and Post -op Vital signs reviewed and stable  Post vital signs: Reviewed and stable  Complications: No apparent anesthesia complications

## 2013-06-02 DIAGNOSIS — H9190 Unspecified hearing loss, unspecified ear: Secondary | ICD-10-CM

## 2013-06-02 DIAGNOSIS — E669 Obesity, unspecified: Secondary | ICD-10-CM | POA: Insufficient documentation

## 2013-06-02 LAB — COMPREHENSIVE METABOLIC PANEL
AST: 158 U/L — ABNORMAL HIGH (ref 0–37)
Albumin: 2.6 g/dL — ABNORMAL LOW (ref 3.5–5.2)
Alkaline Phosphatase: 183 U/L — ABNORMAL HIGH (ref 39–117)
BUN: 18 mg/dL (ref 6–23)
Chloride: 100 mEq/L (ref 96–112)
Creatinine, Ser: 0.97 mg/dL (ref 0.50–1.35)
Potassium: 3.5 mEq/L (ref 3.5–5.1)
Total Bilirubin: 2.5 mg/dL — ABNORMAL HIGH (ref 0.3–1.2)
Total Protein: 6.2 g/dL (ref 6.0–8.3)

## 2013-06-02 LAB — CBC
HCT: 32.8 % — ABNORMAL LOW (ref 39.0–52.0)
MCH: 30.8 pg (ref 26.0–34.0)
MCV: 93.4 fL (ref 78.0–100.0)
RBC: 3.51 MIL/uL — ABNORMAL LOW (ref 4.22–5.81)
WBC: 11.7 10*3/uL — ABNORMAL HIGH (ref 4.0–10.5)

## 2013-06-02 NOTE — Progress Notes (Signed)
Pt stable, scripts, d/c instructions given with no questions/concerns voiced by pt or wife.  Pt transported via wheelchair to private vehicle by NT and wife.

## 2013-06-02 NOTE — Discharge Summary (Signed)
Physician Discharge Summary  Patient ID: Brandon [name] MRN: [id number] DOB/AGE: [birth year] 67 y.o.  Admit date: 05/29/2013 Discharge date: 06/02/2013  Admission Diagnoses: Active Problems:   Calculus of bile duct without mention of cholecystitis or obstruction   Nonspecific elevation of levels of transaminase or lactic acid dehydrogenase (LDH)   Nonspecific (abnormal) findings on radiological and other examination of biliary tract   Acute cholecystitis with chronic cholecystitis  Discharge Diagnoses:  Active Problems:   Calculus of bile duct without mention of cholecystitis or obstruction   Nonspecific elevation of levels of transaminase or lactic acid dehydrogenase (LDH)   Nonspecific (abnormal) findings on radiological and other examination of biliary tract   Acute cholecystitis with chronic cholecystitis   Discharged Condition: good  Hospital Course: Obese gentleman found to have jaundice and abdominal pain.  Suspicion of common bile duct stones and cholecystitis.  Worsening of her function test.  Underwent ERCP.  No stone remaining.  Had drop in liver function tests.  Jaundice improved.  Underwent laparoscopic cholecystectomy.  Significant inflammation and some necrosis.  Drain placed.  Postoperatively, the patient mobilized and advanced to a solid diet gradually.  Pain was well-controlled and transitioned off IV medications.    By the time of discharge, the patient was walking well the hallways, eating food well, having flatus.  Pain was-controlled on an oral regimen.  Based on meeting DC criteria and recovering well, I felt it was safe for the patient to be discharged home with close followup.  Instructions were discussed in detail with patient & his wife.  They expressed understanding and appreciation.  They are written as well.     Consults: GI  Significant Diagnostic Studies: labs:   Results for orders placed during the hospital encounter of 05/29/13 (from the past 72  hour(s))  HEPATIC FUNCTION PANEL     Status: Abnormal   Collection Time    05/31/13  3:54 AM      Result Value Range   Total Protein 6.2  6.0 - 8.3 g/dL   Albumin 2.7 (*) 3.5 - 5.2 g/dL   AST 242 (*) 0 - 37 U/L   ALT 426 (*) 0 - 53 U/L   Alkaline Phosphatase 226 (*) 39 - 117 U/L   Total Bilirubin 6.7 (*) 0.3 - 1.2 mg/dL   Bilirubin, Direct 5.2 (*) 0.0 - 0.3 mg/dL   Comment: ICTERIC SPECIMEN   Indirect Bilirubin 1.5 (*) 0.3 - 0.9 mg/dL  LIPASE, BLOOD     Status: None   Collection Time    05/31/13  3:54 AM      Result Value Range   Lipase 13  11 - 59 U/L  CBC     Status: Abnormal   Collection Time    06/01/13  5:35 AM      Result Value Range   WBC 7.3  4.0 - 10.5 K/uL   RBC 3.64 (*) 4.22 - 5.81 MIL/uL   Hemoglobin 10.9 (*) 13.0 - 17.0 g/dL   HCT 34.2 (*) 39.0 - 52.0 %   MCV 94.0  78.0 - 100.0 fL   MCH 29.9  26.0 - 34.0 pg   MCHC 31.9  30.0 - 36.0 g/dL   RDW 13.8  11.5 - 15.5 %   Platelets 188  150 - 400 K/uL  COMPREHENSIVE METABOLIC PANEL     Status: Abnormal   Collection Time    06/01/13  5:35 AM      Result Value Range   Sodium 139  135 -  145 mEq/L   Potassium 3.5  3.5 - 5.1 mEq/L   Chloride 104  96 - 112 mEq/L   CO2 26  19 - 32 mEq/L   Glucose, Bld 126 (*) 70 - 99 mg/dL   BUN 13  6 - 23 mg/dL   Creatinine, Ser 0.84  0.50 - 1.35 mg/dL   Calcium 8.9  8.4 - 10.5 mg/dL   Total Protein 5.9 (*) 6.0 - 8.3 g/dL   Albumin 2.5 (*) 3.5 - 5.2 g/dL   AST 142 (*) 0 - 37 U/L   ALT 323 (*) 0 - 53 U/L   Alkaline Phosphatase 214 (*) 39 - 117 U/L   Total Bilirubin 3.6 (*) 0.3 - 1.2 mg/dL   GFR calc non Af Amer 89 (*) >90 mL/min   GFR calc Af Amer >90  >90 mL/min   Comment:            The eGFR has been calculated     using the CKD EPI equation.     This calculation has not been     validated in all clinical     situations.     eGFR's persistently     <90 mL/min signify     possible Chronic Kidney Disease.  SURGICAL PCR SCREEN     Status: Abnormal   Collection Time     06/01/13  9:08 AM      Result Value Range   MRSA, PCR NEGATIVE  NEGATIVE   Staphylococcus aureus POSITIVE (*) NEGATIVE   Comment:            The Xpert SA Assay (FDA     approved for NASAL specimens     in patients over 62 years of age),     is one component of     a comprehensive surveillance     program.  Test performance has     been validated by [employer] for patients greater     than or equal to 55 year old.     It is not intended     to diagnose infection nor to     guide or monitor treatment.  COMPREHENSIVE METABOLIC PANEL     Status: Abnormal   Collection Time    06/02/13  4:05 AM      Result Value Range   Sodium 135  135 - 145 mEq/L   Potassium 3.5  3.5 - 5.1 mEq/L   Chloride 100  96 - 112 mEq/L   CO2 23  19 - 32 mEq/L   Glucose, Bld 115 (*) 70 - 99 mg/dL   BUN 18  6 - 23 mg/dL   Creatinine, Ser 0.97  0.50 - 1.35 mg/dL   Calcium 9.1  8.4 - 10.5 mg/dL   Total Protein 6.2  6.0 - 8.3 g/dL   Albumin 2.6 (*) 3.5 - 5.2 g/dL   AST 158 (*) 0 - 37 U/L   ALT 314 (*) 0 - 53 U/L   Alkaline Phosphatase 183 (*) 39 - 117 U/L   Total Bilirubin 2.5 (*) 0.3 - 1.2 mg/dL   GFR calc non Af Amer 84 (*) >90 mL/min   GFR calc Af Amer >90  >90 mL/min   Comment:            The eGFR has been calculated     using the CKD EPI equation.     This calculation has not been     validated in all clinical  situations.     eGFR's persistently     <90 mL/min signify     possible Chronic Kidney Disease.  CBC     Status: Abnormal   Collection Time    06/02/13  4:05 AM      Result Value Range   WBC 11.7 (*) 4.0 - 10.5 K/uL   RBC 3.51 (*) 4.22 - 5.81 MIL/uL   Hemoglobin 10.8 (*) 13.0 - 17.0 g/dL   HCT 32.8 (*) 39.0 - 52.0 %   MCV 93.4  78.0 - 100.0 fL   MCH 30.8  26.0 - 34.0 pg   MCHC 32.9  30.0 - 36.0 g/dL   RDW 13.8  11.5 - 15.5 %   Platelets 195  150 - 400 K/uL    Treatments:   ERCP   Lap chole  Discharge Exam: Blood pressure 126/62, pulse 77, temperature 98.1 F (36.7  C), temperature source Oral, resp. rate 18, height 6' (1.829 m), weight 254 lb (115.214 kg), SpO2 96.00%.  General: Pt awake/alert/oriented x4 in no major acute distress Eyes: PERRL, normal EOM. Sclera nonicteric Neuro: CN II-XII intact w/o focal sensory/motor deficits. Lymph: No head/neck/groin lymphadenopathy Psych:  No delerium/psychosis/paranoia HENT: Normocephalic, Mucus membranes moist.  No thrush.  Hard of hearing and uses hearing aids. Neck: Supple, No tracheal deviation Chest: No pain.  Good respiratory excursion. CV:  Pulses intact.  Regular rhythm MS: Normal AROM mjr joints.  No obvious deformity Abdomen: Soft, Nondistended.  Morbidly obese.  Minimally tender at bellybutton.  Drain serosanguinous.  No incarcerated hernias. Ext:  SCDs BLE.  No significant edema.  No cyanosis Skin: No petechiae / purpura.  Minimal jaundice  - improved   Disposition:   Discharge Orders   Future Orders Complete By Expires     Call MD for:  extreme fatigue  As directed     Call MD for:  hives  As directed     Call MD for:  persistant nausea and vomiting  As directed     Call MD for:  redness, tenderness, or signs of infection (pain, swelling, redness, odor or green/yellow discharge around incision site)  As directed     Call MD for:  severe uncontrolled pain  As directed     Call MD for:  As directed     Comments:      Temperature > 101.63F    Diet - low sodium heart healthy  As directed     Discharge instructions  As directed     Comments:      Please see discharge instruction sheets.  Also refer to handout given an office.  Please call our office if you have any questions or concerns [phone]    Discharge wound care:  As directed     Comments:      If you have closed incisions, shower and bathe over these incisions with soap and water every day.  Remove all surgical dressings on postoperative day #3.  You do not need to replace dressings over the closed incisions unless you feel more  comfortable with a Band-Aid covering it.   If you have an open wound that requires packing, please see wound care instructions.  In general, remove all dressings, wash wound with soap and water and then replace with saline moistened gauze.  Do the dressing change at least every day.  Please call our office [phone] if you have further questions.    Drain care  As directed     Comments:  Teach the patient how to manage drains / tubes if any remaining at the time of discharge.    Examples:  1. Care of drain(s) placed at the time of surgery or by Interventional radiology - usually removed by surgeon or radiologist later as an outpatient    Driving Restrictions  As directed     Comments:      No driving until off narcotics and can safely swerve away without pain during an emergency    Increase activity slowly  As directed     Comments:      Walk an hour a day.  Use 20-30 minute walks.  When you can walk 30 minutes without difficulty, increase to low impact/moderate activities such as biking, jogging, swimming, sexual activity..  Eventually can increase to unrestricted activity when not feeling pain.  If you feel pain: STOP![name]   Let pain protect you from overdoing it.  Use ice/heat/over-the-counter pain medications to help minimize his soreness.  Use pain prescriptions as needed to remain active.  It is better to take extra pain medications and be more active than to stay bedridden to avoid all pain medications.    Lifting restrictions  As directed     Comments:      Avoid heavy lifting initially.  Do not push through pain.  You have no specific weight limit.  Coughing and sneezing or four more stressful to your incision than any lifting you will do. Pain will protect you from injury.  Therefore, avoid intense activity until off all narcotic pain medications.  Coughing and sneezing or four more stressful to your incision than any lifting he will do.    May shower / Bathe  As directed     May  walk up steps  As directed     Sexual Activity Restrictions  As directed     Comments:      Sexual activity as tolerated.  Do not push through pain.  Pain will protect you from injury.    Walk with assistance  As directed     Comments:      Walk over an hour a day.  May use a walker/cane/companion to help with balance and stamina.        Medication List    TAKE these medications       ADVICOR 500-20 MG 24 hr tablet  Generic drug:  niacin-lovastatin  TAKE 2 TABLETS BY MOUTH AT BEDIT[facility]     amoxicillin-clavulanate 875-125 MG per tablet  Commonly known as:  AUGMENTIN  Take 1 tablet by mouth 2 (two) times daily.     aspirin EC 81 MG tablet  Take 81 mg by mouth daily.     buPROPion 150 MG 12 hr tablet  Commonly known as:  WELLBUTRIN SR  Take 150 mg by mouth 2 (two) times daily.     citalopram 20 MG tablet  Commonly known as:  CELEXA  Take 20 mg by mouth daily.     lisinopril 10 MG tablet  Commonly known as:  PRINIVIL,ZESTRIL  Take 1 tablet (10 mg total) by mouth daily.     metoprolol succinate 50 MG 24 hr tablet  Commonly known as:  TOPROL-XL  Take 50 mg by mouth daily.     oxyCODONE 5 MG immediate release tablet  Commonly known as:  Oxy IR/ROXICODONE  Take 1-2 tablets (5-10 mg total) by mouth every 4 (four) hours as needed for pain.     promethazine 25 MG tablet  Commonly known  as:  PHENERGAN  Take 25 mg by mouth every 6 (six) hours as needed for nausea (nausea).     ranitidine 75 MG tablet  Commonly known as:  ZANTAC  Take 75 mg by mouth 2 (two) times daily.           Follow-up Information   Follow up with Shakelia Scrivner C., MD. Schedule an appointment as soon as possible for a visit in 2 weeks.   Contact information:   [address] [place] [address] [phone]       Signed: Adin Hector. 06/02/2013, 8:27 AM

## 2013-06-03 ENCOUNTER — Encounter (HOSPITAL_COMMUNITY): Payer: Self-pay | Admitting: Surgery

## 2013-06-03 DIAGNOSIS — K8010 Calculus of gallbladder with chronic cholecystitis without obstruction: Secondary | ICD-10-CM | POA: Diagnosis not present

## 2013-06-04 ENCOUNTER — Encounter (HOSPITAL_COMMUNITY): Payer: Self-pay | Admitting: Gastroenterology

## 2013-06-05 ENCOUNTER — Telehealth (INDEPENDENT_AMBULATORY_CARE_PROVIDER_SITE_OTHER): Payer: Self-pay

## 2013-06-05 NOTE — Telephone Encounter (Signed)
Pt notified path shows only cholelithiasis no cancer per Dr Johney Maine. The pt has a f/u appt on 06/11/13 with Dr Johney Maine.

## 2013-06-11 ENCOUNTER — Ambulatory Visit (INDEPENDENT_AMBULATORY_CARE_PROVIDER_SITE_OTHER): Payer: Medicare Other | Admitting: Surgery

## 2013-06-11 ENCOUNTER — Encounter (INDEPENDENT_AMBULATORY_CARE_PROVIDER_SITE_OTHER): Payer: Self-pay | Admitting: Surgery

## 2013-06-11 VITALS — BP 128/78 | HR 72 | Resp 16 | Ht 72.0 in | Wt 268.4 lb

## 2013-06-11 DIAGNOSIS — K8050 Calculus of bile duct without cholangitis or cholecystitis without obstruction: Secondary | ICD-10-CM

## 2013-06-11 DIAGNOSIS — K812 Acute cholecystitis with chronic cholecystitis: Secondary | ICD-10-CM

## 2013-06-11 NOTE — Progress Notes (Signed)
Subjective:     Patient ID: Brandon [name], male   DOB: 19-Jun-1946, 67 y.o.   MRN: [id number]  HPI  Brandon [name]  1946/04/16 [id number]  Patient Care Team: Hulen Shouts, MD as PCP - General (Family Medicine)  This patient is a 66 y.o.male who presents today s/p surgery 06/01/2013  POST-OPERATIVE DIAGNOSIS: Acute on chronic cholecystitis  Jaundice due to transient choledocolithiasis   PROCEDURE: Procedure(s):  LAPAROSCOPIC CHOLECYSTECTOMY  SURGEON: Surgeon(s):  Adin Hector, MD  ASSISTANT: RN   Diagnosis Gallbladder - CHRONIC CHOLECYSTITIS. - CHOLELITHIASIS PRESENT. - NO TUMOR SEEN.  The patient comes in today feeling well.  Doing a bland diet.  Hoping to get back to a diet with high-fiber.  No diarrhea.  No fevers or chills.  Still struggles with osteoarthritis of his right knee.  However, he is using the bike and getting about 20 minutes in the day.  Appetite better.  The drain output has become more apple juice consistency.  Output around 50-75 mL a day.  No more jaundiced.  No dark urine.  Does not feel like a pumpkin anymore.   Patient Active Problem List   Diagnosis Date Noted  . HOH (hard of hearing) 06/02/2013  . Obesity (BMI 30-39.9) 06/02/2013  . Acute cholecystitis with chronic cholecystitis 06/01/2013  . Calculus of bile duct without mention of cholecystitis or obstruction 05/31/2013  . Nonspecific elevation of levels of transaminase or lactic acid dehydrogenase (LDH) 05/31/2013  . Nonspecific (abnormal) findings on radiological and other examination of biliary tract 05/31/2013  . TOBACCO ABUSE 02/22/2010  . DYSLIPIDEMIA 06/25/2009  . MITRAL INSUFFICIENCY 06/25/2009  . HYPERTENSION 06/25/2009  . AORTIC INSUFFICIENCY 06/25/2009  . VALVULAR HEART DISEASE 06/25/2009  . CARDIOVASCULAR DISEASE 06/25/2009  . CEREBROVASCULAR DISEASE 06/25/2009  . CEREBROVASCULAR ACCIDENT WITH RIGHT HEMIPARESIS 06/25/2009  . COPD 06/25/2009  . FOOT SURGERY, HX OF  06/25/2009  . POLYPECTOMY, HX OF 06/25/2009  . TONSILLECTOMY, HX OF 06/25/2009    Past Medical History  Diagnosis Date  . Hemiplegia affecting unspecified side, late effect of cerebrovascular disease   . Mitral valve insufficiency and aortic valve insufficiency   . Aortic insufficiency   . Dyslipidemia   . Hypertension   . Stroke   . Carotid stenosis     Right carotid stent (widely patent) 40 - 59% left plaque 11/13    Past Surgical History  Procedure Laterality Date  . Foot surgery      right  . Back surgery      lumbar back  . Polypectomy    . Tonsillectomy    . Laparoscopic cholecystectomy single port N/A 06/01/2013    Procedure: LAPAROSCOPIC CHOLECYSTECTOMY SINGLE PORT;  Surgeon: Adin Hector, MD;  Location: [facility];  Service: General;  Laterality: N/A;  . Ercp N/A 05/31/2013    Procedure: ENDOSCOPIC RETROGRADE CHOLANGIOPANCREATOGRAPHY (ERCP);  Surgeon: Ladene Artist, MD;  Location: [facility];  Service: Endoscopy;  Laterality: N/A;    History   Social History  . Marital Status: Married    Spouse Name: N/A    Number of Children: 3  . Years of Education: N/A   Occupational History  .     Social History Main Topics  . Smoking status: Former Smoker    Types: Cigarettes    Quit date: 07/15/2011  . Smokeless tobacco: Never Used     Comment: Quit tobacco in December  . Alcohol Use: Yes     Comment: moderate wine  . Drug Use:  No  . Sexually Active: Not on file   Other Topics Concern  . Not on file   Social History Narrative   Two living children.  Lives with wife.      Family History  Problem Relation Age of Onset  . Stroke Father     No details  . Angina Mother     Current Outpatient Prescriptions  Medication Sig Dispense Refill  . ADVICOR 500-20 MG 24 hr tablet TAKE 2 TABLETS BY MOUTH AT BEDITME  60 tablet  11  . aspirin EC 81 MG tablet Take 81 mg by mouth daily.        [name] buPROPion (WELLBUTRIN SR) 150 MG 12 hr tablet Take 150 mg by mouth 2  (two) times daily.       . citalopram (CELEXA) 20 MG tablet Take 20 mg by mouth daily.       [name] lisinopril (PRINIVIL,ZESTRIL) 10 MG tablet Take 1 tablet (10 mg total) by mouth daily.  90 tablet  3  . metoprolol (TOPROL-XL) 50 MG 24 hr tablet Take 50 mg by mouth daily.       . Omeprazole (PRILOSEC PO) Take by mouth.      . promethazine (PHENERGAN) 25 MG tablet Take 25 mg by mouth every 6 (six) hours as needed for nausea (nausea).      . ranitidine (ZANTAC) 75 MG tablet Take 75 mg by mouth 2 (two) times daily.        [name] amoxicillin-clavulanate (AUGMENTIN) 875-125 MG per tablet Take 1 tablet by mouth 2 (two) times daily.  10 tablet  1  . oxyCODONE (OXY IR/ROXICODONE) 5 MG immediate release tablet Take 1-2 tablets (5-10 mg total) by mouth every 4 (four) hours as needed for pain.  50 tablet  0   No current facility-administered medications for this visit.     Allergies  Allergen Reactions  . Bee Venom Anaphylaxis and Swelling    BP 128/78  Pulse 72  Resp 16  Ht 6' (1.829 m)  Wt 268 lb 6.4 oz (121.745 kg)  BMI 36.39 kg/m2  US Abdomen Complete  05/29/2013   *RADIOLOGY REPORT*  Clinical Data:  Abdominal pain.  Nausea and vomiting.  COMPLETE ABDOMINAL ULTRASOUND  Comparison:  No priors.  Findings:  Gallbladder:  Multiple echogenic foci with posterior acoustic shadowing within the lumen of the gallbladder, compatible with gallstones.  Gallbladder wall thickness is slightly increased at 6.3 mm.  No abnormal pericholecystic fluid.  Per report from the sonographer, there was a positive sonographic Murphy's sign on examination.  Common bile duct:  Mildly dilated measuring up to 8.9 mm in the porta hepatis.  Liver:  Diffusely echogenic hepatic parenchyma, suggestive of hepatic steatosis.  No definite focal cystic or solid hepatic lesions.  Probable mild intrahepatic biliary ductal dilatation.  IVC:  Cannot be visualized secondary to overlying bowel gas.  Pancreas:  Cannot be visualized secondary to  overlying bowel gas.  Spleen:  Normal size and echotexture without focal parenchymal abnormality.8.0 cm in length.  Right Kidney:  No hydronephrosis.  Well-preserved cortex.  Normal size and parenchymal echotexture. 11.3 cm in length.  2.5 x 2.6 x 2.8 cm anechoic lesion with increased through transmission in the interpolar region of the kidney compatible with a simple cyst.  Left Kidney:  Poorly visualized secondary to body habitus.  No hydronephrosis.  Well-preserved cortex.  Normal size and parenchymal echotexture without focal abnormalities. 11.5 cm in length.  Abdominal aorta:  Atherosclerotic measuring up to 2.1  cm in diameter proximally, and tapers appropriately distally.  IMPRESSION: 1.  Study is positive for cholelithiasis, and given the gallbladder wall thickening and positive Murphy's sign, it is highly concerning for acute cholecystitis.  Additionally, there is some mild dilatation of the common bile duct and probable intrahepatic biliary ductal dilatation, which increases the likelihood of choledocholithiasis and biliary tract obstruction. Surgical consultation is recommended. 2.  Simple cyst in the right kidney. 3.  Atherosclerosis.   Original Report Authenticated By: Vinnie Langton, M.D.   Mr 3d Recon At Scanner  05/30/2013   CLINICAL DATA:  Right upper quadrant pain. Nausea and vomiting. Gallstones and biliary dilatation seen on ultrasound.  EXAM: MRI ABDOMEN WITHOUT AND WITH CONTRAST (INCLUDING MRCP)  TECHNIQUE: Multiplanar multisequence MR imaging of the abdomen was performed both before and after the administration of intravenous contrast. Heavily T2-weighted images of the biliary and pancreatic ducts were obtained, and three-dimensional MRCP images were rendered by post processing.  CONTRAST:  39mL MULTIHANCE GADOBENATE DIMEGLUMINE 529 MG/ML IV SOLN  COMPARISON:  Abdomen ultrasound on 05/29/2013  FINDINGS: Tiny gallstones are demonstrated. Gallbladder is mildly distended and there is mild  diffuse gallbladder wall thickening, but no evidence of significant pericholecystic inflammatory changes or fluid. Acute cholecystitis cannot be excluded.  Mild diffuse biliary ductal dilatation is seen, with the common duct measuring 10 mm in diameter. There is a tiny filling defect seen in the distal CBD adjacent to the ampulla on the thin slab coronal MPRs, although this cannot definitely be confirmed on the axial plane images. This is suspicious for a small distal common bile duct stone.  No liver masses are identified. There is no evidence of pancreatic ductal dilatation or pancreatic mass. No evidence of peripancreatic inflammatory changes or fluid collections. The spleen, adrenal glands, and left kidney are normal in appearance. A small right posterior renal cyst is seen however there is no evidence of renal masses or hydronephrosis.  No lymphadenopathy or other soft tissue masses identified within the abdomen. No abnormal fluid collections are seen and there is no evidence of dilated bowel loops.  IMPRESSION: 1. Cholelithiasis with mild gallbladder distention and wall thickening; acute cholecystitis cannot be excluded. 2. Mild diffuse biliary dilatation. Suspect small distal common bile duct stone at the ampulla on thin slab coronal MRCP images.   Electronically Signed   By: Earle Gell   On: 05/30/2013 16:07   Dg Ercp Biliary & Pancreatic Ducts  05/31/2013   *RADIOLOGY REPORT*  Clinical Data: Choledocholithiasis  ERCP  Comparison:  MRCP 05/30/2013  Technique:  Multiple spot images obtained with the fluoroscopic device and submitted for interpretation post-procedure.  ERCP was performed by Dr. Lucio Edward.  Fluoroscopy time:  2 minutes 24 seconds reported  Findings: A total of 15 intraoperative spot fluoroscopic films demonstrate an endoscope in the descending duodenum with retrograde cannulation of the common bile duct and retrograde opacification of the biliary tree.  There is mild biliary ductal  dilatation.  Focal filling defect in the distal common duct suggestive of choledocholithiasis.  The images demonstrate balloon sweep of the biliary tree.  IMPRESSION: ERCP, sphincterotomy and balloon sweep of the common bile duct as above.  These images were submitted for radiologic interpretation only. Please see the procedural report for the amount of contrast and the fluoroscopy time utilized.   Original Report Authenticated By: Jacqulynn Cadet, M.D.   Mr Abd W/wo Cm/mrcp  05/30/2013   CLINICAL DATA:  Right upper quadrant pain. Nausea and vomiting. Gallstones and biliary  dilatation seen on ultrasound.  EXAM: MRI ABDOMEN WITHOUT AND WITH CONTRAST (INCLUDING MRCP)  TECHNIQUE: Multiplanar multisequence MR imaging of the abdomen was performed both before and after the administration of intravenous contrast. Heavily T2-weighted images of the biliary and pancreatic ducts were obtained, and three-dimensional MRCP images were rendered by post processing.  CONTRAST:  70mL MULTIHANCE GADOBENATE DIMEGLUMINE 529 MG/ML IV SOLN  COMPARISON:  Abdomen ultrasound on 05/29/2013  FINDINGS: Tiny gallstones are demonstrated. Gallbladder is mildly distended and there is mild diffuse gallbladder wall thickening, but no evidence of significant pericholecystic inflammatory changes or fluid. Acute cholecystitis cannot be excluded.  Mild diffuse biliary ductal dilatation is seen, with the common duct measuring 10 mm in diameter. There is a tiny filling defect seen in the distal CBD adjacent to the ampulla on the thin slab coronal MPRs, although this cannot definitely be confirmed on the axial plane images. This is suspicious for a small distal common bile duct stone.  No liver masses are identified. There is no evidence of pancreatic ductal dilatation or pancreatic mass. No evidence of peripancreatic inflammatory changes or fluid collections. The spleen, adrenal glands, and left kidney are normal in appearance. A small right posterior  renal cyst is seen however there is no evidence of renal masses or hydronephrosis.  No lymphadenopathy or other soft tissue masses identified within the abdomen. No abnormal fluid collections are seen and there is no evidence of dilated bowel loops.  IMPRESSION: 1. Cholelithiasis with mild gallbladder distention and wall thickening; acute cholecystitis cannot be excluded. 2. Mild diffuse biliary dilatation. Suspect small distal common bile duct stone at the ampulla on thin slab coronal MRCP images.   Electronically Signed   By: Earle Gell   On: 05/30/2013 16:07     Review of Systems  Constitutional: Negative for fever, chills and diaphoresis.  HENT: Negative for sore throat, trouble swallowing and neck pain.   Eyes: Negative for photophobia and visual disturbance.  Respiratory: Negative for choking and shortness of breath.   Cardiovascular: Negative for chest pain and palpitations.  Gastrointestinal: Negative for nausea, vomiting, abdominal pain, diarrhea, constipation, blood in stool, abdominal distention, anal bleeding and rectal pain.  Genitourinary: Negative for dysuria, urgency, difficulty urinating and testicular pain.  Musculoskeletal: Negative for myalgias, arthralgias and gait problem.  Skin: Negative for color change and rash.  Neurological: Negative for dizziness, speech difficulty, weakness and numbness.  Hematological: Negative for adenopathy.  Psychiatric/Behavioral: Negative for hallucinations, confusion and agitation.       Objective:   Physical Exam  Constitutional: He is oriented to person, place, and time. He appears well-developed and well-nourished. No distress.  HENT:  Head: Normocephalic.  Mouth/Throat: Oropharynx is clear and moist. No oropharyngeal exudate.  Eyes: Conjunctivae and EOM are normal. Pupils are equal, round, and reactive to light. No scleral icterus.  Neck: Normal range of motion. No tracheal deviation present.  Cardiovascular: Normal rate, normal  heart sounds and intact distal pulses.   Pulmonary/Chest: Effort normal. No respiratory distress.  Abdominal: Soft. He exhibits no distension and no mass. There is no tenderness. There is no guarding. Hernia confirmed negative in the right inguinal area and confirmed negative in the left inguinal area.  Incisions clean with normal healing ridges.  No hernias.  Drain with serous output.  Removed without difficulty.  Musculoskeletal: Normal range of motion. He exhibits no tenderness.  Neurological: He is alert and oriented to person, place, and time. No cranial nerve deficit. He exhibits normal muscle tone. Coordination  normal.  Skin: Skin is warm and dry. No rash noted. He is not diaphoretic.  Psychiatric: He has a normal mood and affect. His behavior is normal.       Assessment:     Transient choledocholithiasis and chronic cholecystitis improved after negative ERCP and Postoperative day #10 from laparoscopic cholecystectomy.     Plan:     Increase activity as tolerated to regular activity.  Low impact exercise such as walking an hour a day at least ideal.  Do not push through pain.  Consider water aerobics for last impact on his joints but still moderate activity.  Diet as tolerated.  Low fat high fiber diet ideal.  Bowel regimen with 30 g fiber a day and fiber supplement as needed to avoid problems.  Return to clinic as needed.   Normally I would have people come back again, but he is doing so well.  His wife feels comfortable with calling us if there are issues.   Instructions discussed.  Followup with primary care physician for other health issues as would normally be done.  Questions answered.  The patient expressed understanding and appreciation

## 2013-06-11 NOTE — Patient Instructions (Addendum)
LAPAROSCOPIC SURGERY: POST OP INSTRUCTIONS  1. DIET: Follow a light bland diet the first 24 hours after arrival home, such as soup, liquids, crackers, etc.  Be sure to include lots of fluids daily.  Avoid fast food or heavy meals as your are more likely to get nauseated.  Eat a low fat the next few days after surgery.   2. Take your usually prescribed home medications unless otherwise directed. 3. PAIN CONTROL: a. Pain is best controlled by a usual combination of three different methods TOGETHER: i. Ice/Heat ii. Over the counter pain medication iii. Prescription pain medication b. Most patients will experience some swelling and bruising around the incisions.  Ice packs or heating pads (30-60 minutes up to 6 times a day) will help. Use ice for the first few days to help decrease swelling and bruising, then switch to heat to help relax tight/sore spots and speed recovery.  Some people prefer to use ice alone, heat alone, alternating between ice & heat.  Experiment to what works for you.  Swelling and bruising can take several weeks to resolve.   c. It is helpful to take an over-the-counter pain medication regularly for the first few weeks.  Choose one of the following that works best for you: i. Naproxen (Aleve, etc)  Two 236m tabs twice a day ii. Ibuprofen (Advil, etc) Three 2060mtabs four times a day (every meal & bedtime) iii. Acetaminophen (Tylenol, etc) 500-65025mour times a day (every meal & bedtime) d. A  prescription for pain medication (such as oxycodone, hydrocodone, etc) should be given to you upon discharge.  Take your pain medication as prescribed.  i. If you are having problems/concerns with the prescription medicine (does not control pain, nausea, vomiting, rash, itching, etc), please call us [phone] see if we need to switch you to a different pain medicine that will work better for you and/or control your side effect better. ii. If you need a refill on your pain medication,  please contact your pharmacy.  They will contact our office to request authorization. Prescriptions will not be filled after 5 pm or on week-ends. 4. Avoid getting constipated.  Between the surgery and the pain medications, it is common to experience some constipation.  Increasing fluid intake and taking a fiber supplement (such as Metamucil, Citrucel, FiberCon, MiraLax, etc) 1-2 times a day regularly will usually help prevent this problem from occurring.  A mild laxative (prune juice, Milk of Magnesia, MiraLax, etc) should be taken according to package directions if there are no bowel movements after 48 hours.   5. Watch out for diarrhea.  If you have many loose bowel movements, simplify your diet to bland foods & liquids for a few days.  Stop any stool softeners and decrease your fiber supplement.  Switching to mild anti-diarrheal medications (Kayopectate, Pepto Bismol) can help.  If this worsens or does not improve, please call us.[place]. Wash / shower every day.  You may shower over the dressings as they are waterproof.  Continue to shower over incision(s) after the dressing is off. 7. Remove your waterproof bandages 5 days after surgery.  You may leave the incision open to air.  You may replace a dressing/Band-Aid to cover the incision for comfort if you wish.  8. ACTIVITIES as tolerated:   a. You may resume regular (light) daily activities beginning the next day-such as daily self-care, walking, climbing stairs-gradually increasing activities as tolerated.  If you can walk 30 minutes without difficulty, it  is safe to try more intense activity such as jogging, treadmill, bicycling, low-impact aerobics, swimming, etc. b. Save the most intensive and strenuous activity for last such as sit-ups, heavy lifting, contact sports, etc  Refrain from any heavy lifting or straining until you are off narcotics for pain control.   c. DO NOT PUSH THROUGH PAIN.  Let pain be your guide: If it hurts to do something, don't do  it.  Pain is your body warning you to avoid that activity for another week until the pain goes down. d. You may drive when you are no longer taking prescription pain medication, you can comfortably wear a seatbelt, and you can safely maneuver your car and apply brakes. e. Dennis Bast may have sexual intercourse when it is comfortable.  9. FOLLOW UP in our office a. Please call [facility] at [phone] to set up an appointment to see your surgeon in the office for a follow-up appointment approximately 2-3 weeks after your surgery. b. Make sure that you call for this appointment the day you arrive home to insure a convenient appointment time. 10. IF YOU HAVE DISABILITY OR FAMILY LEAVE FORMS, BRING THEM TO THE OFFICE FOR PROCESSING.  DO NOT GIVE THEM TO YOUR DOCTOR.   WHEN TO CALL us [phone]: 1. Poor pain control 2. Reactions / problems with new medications (rash/itching, nausea, etc)  3. Fever over 101.5 F (38.5 C) 4. Inability to urinate 5. Nausea and/or vomiting 6. Worsening swelling or bruising 7. Continued bleeding from incision. 8. Increased pain, redness, or drainage from the incision   The clinic staff is available to answer your questions during regular business hours (8:30am-5pm).  Please don't hesitate to call and ask to speak to one of our nurses for clinical concerns.   If you have a medical emergency, go to the nearest emergency room or call 911.  A surgeon from [facility] is always on call at the [facility], [place], [place], [place], [place]  [address] ? MAIN: [phone] ? TOLL FREE: [phone] ?  FAX [phone] [email or web address]  GETTING TO GOOD BOWEL HEALTH. Irregular bowel habits such as constipation and diarrhea can lead to many problems over time.  Having one soft bowel movement a day is the most important way to prevent further problems.  The anorectal canal is designed to handle stretching  and feces to safely manage our ability to get rid of solid waste (feces, poop, stool) out of our body.  BUT, hard constipated stools can act like ripping concrete bricks and diarrhea can be a burning fire to this very sensitive area of our body, causing inflamed hemorrhoids, anal fissures, increasing risk is perirectal abscesses, abdominal pain/bloating, an making irritable bowel worse.     The goal: ONE SOFT BOWEL MOVEMENT A DAY!  To have soft, regular bowel movements:    Drink at least 8 tall glasses of water a day.     Take plenty of fiber.  Fiber is the undigested part of plant food that passes into the colon, acting s "natures broom" to encourage bowel motility and movement.  Fiber can absorb and hold large amounts of water. This results in a larger, bulkier stool, which is soft and easier to pass. Work gradually over several weeks up to 6 servings a day of fiber (25g a day even more if needed) in the form of: o Vegetables -- Root (potatoes, carrots, turnips), leafy green (lettuce, salad greens, celery,  spinach), or cooked high residue (cabbage, broccoli, etc) o Fruit -- Fresh (unpeeled skin & pulp), Dried (prunes, apricots, cherries, etc ),  or stewed ( applesauce)  o Whole grain breads, pasta, etc (whole wheat)  o Bran cereals    Bulking Agents -- This type of water-retaining fiber generally is easily obtained each day by one of the following:  o Psyllium bran -- The psyllium plant is remarkable because its ground seeds can retain so much water. This product is available as Metamucil, Konsyl, Effersyllium, Per Diem Fiber, or the less expensive generic preparation in drug and health food stores. Although labeled a laxative, it really is not a laxative.  o Methylcellulose -- This is another fiber derived from wood which also retains water. It is available as Citrucel. o Polyethylene Glycol - and "artificial" fiber commonly called Miralax or Glycolax.  It is helpful for people with gassy or bloated  feelings with regular fiber o Flax Seed - a less gassy fiber than psyllium   No reading or other relaxing activity while on the toilet. If bowel movements take longer than 5 minutes, you are too constipated   AVOID CONSTIPATION.  High fiber and water intake usually takes care of this.  Sometimes a laxative is needed to stimulate more frequent bowel movements, but    Laxatives are not a good long-term solution as it can wear the colon out. o Osmotics (Milk of Magnesia, Fleets phosphosoda, Magnesium citrate, MiraLax, GoLytely) are safer than  o Stimulants (Senokot, Castor Oil, Dulcolax, Ex Lax)    o Do not take laxatives for more than 7days in a row.    IF SEVERELY CONSTIPATED, try a Bowel Retraining Program: o Do not use laxatives.  o Eat a diet high in roughage, such as bran cereals and leafy vegetables.  o Drink six (6) ounces of prune or apricot juice each morning.  o Eat two (2) large servings of stewed fruit each day.  o Take one (1) heaping tablespoon of a psyllium-based bulking agent twice a day. Use sugar-free sweetener when possible to avoid excessive calories.  o Eat a normal breakfast.  o Set aside 15 minutes after breakfast to sit on the toilet, but do not strain to have a bowel movement.  o If you do not have a bowel movement by the third day, use an enema and repeat the above steps.    Controlling diarrhea o Switch to liquids and simpler foods for a few days to avoid stressing your intestines further. o Avoid dairy products (especially milk & ice cream) for a short time.  The intestines often can lose the ability to digest lactose when stressed. o Avoid foods that cause gassiness or bloating.  Typical foods include beans and other legumes, cabbage, broccoli, and dairy foods.  Every person has some sensitivity to other foods, so listen to our body and avoid those foods that trigger problems for you. o Adding fiber (Citrucel, Metamucil, psyllium, Miralax) gradually can help thicken  stools by absorbing excess fluid and retrain the intestines to act more normally.  Slowly increase the dose over a few weeks.  Too much fiber too soon can backfire and cause cramping & bloating. o Probiotics (such as active yogurt, Align, etc) may help repopulate the intestines and colon with normal bacteria and calm down a sensitive digestive tract.  Most studies show it to be of mild help, though, and such products can be costly. o Medicines:   Bismuth subsalicylate (ex. Kayopectate, [place]) every  30 minutes for up to 6 doses can help control diarrhea.  Avoid if pregnant.   Loperamide (Immodium) can slow down diarrhea.  Start with two tablets (4mg  total) first and then try one tablet every 6 hours.  Avoid if you are having fevers or severe pain.  If you are not better or start feeling worse, stop all medicines and call your doctor for advice o Call your doctor if you are getting worse or not better.  Sometimes further testing (cultures, endoscopy, X-ray studies, bloodwork, etc) may be needed to help diagnose and treat the cause of the diarrhea.  Cholecystitis Cholecystitis is an inflammation of your gallbladder. It is usually caused by a buildup of gallstones or sludge (cholelithiasis) in your gallbladder. The gallbladder stores a fluid that helps digest fats (bile). Cholecystitis is serious and needs treatment right away.  CAUSES  Gallstones. Gallstones can block the tube that leads to your gallbladder, causing bile to build up. As bile builds up, the gallbladder becomes inflamed. Bile duct problems, such as blockage from scarring or kinking. Tumors. Tumors can stop bile from leaving your gallbladder correctly, causing bile to build up. As bile builds up, the gallbladder becomes inflamed. SYMPTOMS  Nausea. Vomiting. Abdominal pain, especially in the upper right area of your abdomen. Abdominal tenderness or bloating. Sweating. Chills. Fever. Yellowing of the skin and the whites of the  eyes (jaundice). DIAGNOSIS  Your caregiver may order blood tests to look for infection or gallbladder problems. Your caregiver may also order imaging tests, such as an ultrasound or computed tomography (CT) scan. Further tests may include a hepatobiliary iminodiacetic acid (HIDA) scan. This scan allows your caregiver to see your bile move from the liver to the gallbladder and to the small intestine. TREATMENT  A hospital stay is usually necessary to lessen the inflammation of your gallbladder. You may be required to not eat or drink (fast) for a certain amount of time. You may be given medicine to treat pain or an antibiotic medicine to treat an infection. Surgery may be needed to remove your gallbladder (cholecystectomy) once the inflammation has gone down. Surgery may be needed right away if you develop complications such as death of gallbladder tissue (gangrene) or a tear (perforation) of the gallbladder.  [place] care will depend on your treatment. In general: If you were given antibiotics, take them as directed. Finish them even if you start to feel better. Only take over-the-counter or prescription medicines for pain, discomfort, or fever as directed by your caregiver. Follow a low-fat diet until you see your caregiver again. Keep all follow-up visits as directed by your caregiver. SEEK IMMEDIATE MEDICAL CARE IF:  Your pain is increasing and not controlled by medicines. Your pain moves to another part of your abdomen or to your back. You have a fever. You have nausea and vomiting. MAKE SURE YOU: Understand these instructions. Will watch your condition. Will get help right away if you are not doing well or get worse. Document Released: 11/28/2005 Document Revised: 02/20/2012 Document Reviewed: 10/14/2011 [facility] Patient Information 2014 [place], [place].

## 2013-06-21 DIAGNOSIS — Z125 Encounter for screening for malignant neoplasm of prostate: Secondary | ICD-10-CM | POA: Diagnosis not present

## 2013-06-21 DIAGNOSIS — I679 Cerebrovascular disease, unspecified: Secondary | ICD-10-CM | POA: Diagnosis not present

## 2013-06-21 DIAGNOSIS — R7301 Impaired fasting glucose: Secondary | ICD-10-CM | POA: Diagnosis not present

## 2013-06-21 DIAGNOSIS — E7800 Pure hypercholesterolemia, unspecified: Secondary | ICD-10-CM | POA: Diagnosis not present

## 2013-06-21 DIAGNOSIS — Z79899 Other long term (current) drug therapy: Secondary | ICD-10-CM | POA: Diagnosis not present

## 2013-06-21 DIAGNOSIS — I10 Essential (primary) hypertension: Secondary | ICD-10-CM | POA: Diagnosis not present

## 2013-06-21 DIAGNOSIS — Z0000 Encounter for general adult medical examination without abnormal findings: Secondary | ICD-10-CM | POA: Diagnosis not present

## 2013-06-21 DIAGNOSIS — D62 Acute posthemorrhagic anemia: Secondary | ICD-10-CM | POA: Diagnosis not present

## 2013-06-21 DIAGNOSIS — F329 Major depressive disorder, single episode, unspecified: Secondary | ICD-10-CM | POA: Diagnosis not present

## 2013-06-24 DIAGNOSIS — K263 Acute duodenal ulcer without hemorrhage or perforation: Secondary | ICD-10-CM | POA: Diagnosis not present

## 2013-07-17 ENCOUNTER — Other Ambulatory Visit: Payer: Self-pay

## 2013-08-05 ENCOUNTER — Other Ambulatory Visit: Payer: Self-pay | Admitting: Internal Medicine

## 2013-08-06 ENCOUNTER — Other Ambulatory Visit: Payer: Self-pay

## 2013-08-06 MED ORDER — LISINOPRIL 10 MG PO TABS: 10.0000 mg | ORAL_TABLET | Freq: Every day | ORAL | Status: DC

## 2013-10-15 DIAGNOSIS — Z23 Encounter for immunization: Secondary | ICD-10-CM | POA: Diagnosis not present

## 2013-10-17 ENCOUNTER — Other Ambulatory Visit: Payer: Self-pay

## 2013-11-14 ENCOUNTER — Encounter: Payer: Self-pay | Admitting: Cardiology

## 2013-11-14 ENCOUNTER — Ambulatory Visit (HOSPITAL_COMMUNITY): Payer: Medicare Other | Attending: Cardiology

## 2013-11-14 DIAGNOSIS — E785 Hyperlipidemia, unspecified: Secondary | ICD-10-CM | POA: Diagnosis not present

## 2013-11-14 DIAGNOSIS — I2510 Atherosclerotic heart disease of native coronary artery without angina pectoris: Secondary | ICD-10-CM | POA: Diagnosis not present

## 2013-11-14 DIAGNOSIS — I10 Essential (primary) hypertension: Secondary | ICD-10-CM | POA: Diagnosis not present

## 2013-11-14 DIAGNOSIS — Z8673 Personal history of transient ischemic attack (TIA), and cerebral infarction without residual deficits: Secondary | ICD-10-CM | POA: Insufficient documentation

## 2013-11-14 DIAGNOSIS — I658 Occlusion and stenosis of other precerebral arteries: Secondary | ICD-10-CM | POA: Diagnosis not present

## 2013-11-14 DIAGNOSIS — Z87891 Personal history of nicotine dependence: Secondary | ICD-10-CM | POA: Diagnosis not present

## 2013-11-14 DIAGNOSIS — J449 Chronic obstructive pulmonary disease, unspecified: Secondary | ICD-10-CM | POA: Diagnosis not present

## 2013-11-14 DIAGNOSIS — I6529 Occlusion and stenosis of unspecified carotid artery: Secondary | ICD-10-CM | POA: Diagnosis not present

## 2013-11-14 DIAGNOSIS — J4489 Other specified chronic obstructive pulmonary disease: Secondary | ICD-10-CM | POA: Insufficient documentation

## 2013-11-28 ENCOUNTER — Other Ambulatory Visit: Payer: Self-pay | Admitting: Gastroenterology

## 2013-11-28 DIAGNOSIS — K319 Disease of stomach and duodenum, unspecified: Secondary | ICD-10-CM | POA: Diagnosis not present

## 2013-11-28 DIAGNOSIS — R12 Heartburn: Secondary | ICD-10-CM | POA: Diagnosis not present

## 2013-11-28 DIAGNOSIS — K263 Acute duodenal ulcer without hemorrhage or perforation: Secondary | ICD-10-CM | POA: Diagnosis not present

## 2013-11-28 DIAGNOSIS — K2970 Gastritis, unspecified, without bleeding: Secondary | ICD-10-CM | POA: Diagnosis not present

## 2013-12-23 DIAGNOSIS — E782 Mixed hyperlipidemia: Secondary | ICD-10-CM | POA: Diagnosis not present

## 2013-12-23 DIAGNOSIS — I10 Essential (primary) hypertension: Secondary | ICD-10-CM | POA: Diagnosis not present

## 2013-12-23 DIAGNOSIS — R7309 Other abnormal glucose: Secondary | ICD-10-CM | POA: Diagnosis not present

## 2013-12-30 DIAGNOSIS — I10 Essential (primary) hypertension: Secondary | ICD-10-CM | POA: Diagnosis not present

## 2014-03-18 ENCOUNTER — Other Ambulatory Visit: Payer: Self-pay | Admitting: Internal Medicine

## 2014-04-21 ENCOUNTER — Other Ambulatory Visit: Payer: Self-pay | Admitting: Internal Medicine

## 2014-06-03 ENCOUNTER — Other Ambulatory Visit: Payer: Self-pay

## 2014-06-03 MED ORDER — ADVICOR 500-20 MG PO TB24: ORAL_TABLET | ORAL | Status: DC

## 2014-06-06 ENCOUNTER — Other Ambulatory Visit: Payer: Self-pay

## 2014-06-06 MED ORDER — ADVICOR 500-20 MG PO TB24: ORAL_TABLET | ORAL | Status: DC

## 2014-06-23 ENCOUNTER — Encounter: Payer: Self-pay | Admitting: Internal Medicine

## 2014-06-23 DIAGNOSIS — I679 Cerebrovascular disease, unspecified: Secondary | ICD-10-CM | POA: Diagnosis not present

## 2014-06-23 DIAGNOSIS — F329 Major depressive disorder, single episode, unspecified: Secondary | ICD-10-CM | POA: Diagnosis not present

## 2014-06-23 DIAGNOSIS — E782 Mixed hyperlipidemia: Secondary | ICD-10-CM | POA: Diagnosis not present

## 2014-06-23 DIAGNOSIS — F3289 Other specified depressive episodes: Secondary | ICD-10-CM | POA: Diagnosis not present

## 2014-06-23 DIAGNOSIS — Z0000 Encounter for general adult medical examination without abnormal findings: Secondary | ICD-10-CM | POA: Diagnosis not present

## 2014-06-23 DIAGNOSIS — E669 Obesity, unspecified: Secondary | ICD-10-CM | POA: Diagnosis not present

## 2014-06-23 DIAGNOSIS — Z23 Encounter for immunization: Secondary | ICD-10-CM | POA: Diagnosis not present

## 2014-06-23 DIAGNOSIS — I10 Essential (primary) hypertension: Secondary | ICD-10-CM | POA: Diagnosis not present

## 2014-06-23 DIAGNOSIS — I739 Peripheral vascular disease, unspecified: Secondary | ICD-10-CM | POA: Diagnosis not present

## 2014-06-23 DIAGNOSIS — Z125 Encounter for screening for malignant neoplasm of prostate: Secondary | ICD-10-CM | POA: Diagnosis not present

## 2014-06-23 DIAGNOSIS — R7309 Other abnormal glucose: Secondary | ICD-10-CM | POA: Diagnosis not present

## 2014-07-20 NOTE — Progress Notes (Signed)
HPI Patient is a 68 yo with a history of CV disease (s/p PTA of  R carotid in 2000), HL , HTN and mild aortic stenosis    I saw him in winter 2014 No CP  No SOB  No dizziness  No numbness Does 3 t o4 miles on stationary bike Allergies  Allergen Reactions  . Bee Venom Anaphylaxis and Swelling    Current Outpatient Prescriptions  Medication Sig Dispense Refill  . ADVICOR 500-20 MG 24 hr tablet TAKE 2 TABLETS BY MOUTH AT BEDITME  60 tablet  1  . aspirin EC 81 MG tablet Take 81 mg by mouth daily.        [name] buPROPion (WELLBUTRIN SR) 150 MG 12 hr tablet Take 150 mg by mouth 2 (two) times daily.       . citalopram (CELEXA) 20 MG tablet Take 20 mg by mouth daily.       [name] EPIPEN 2-PAK 0.3 MG/0.3ML SOAJ injection As directed      . lisinopril (PRINIVIL,ZESTRIL) 10 MG tablet Take 1 tablet (10 mg total) by mouth daily.  90 tablet  3  . metoprolol (TOPROL-XL) 50 MG 24 hr tablet Take 50 mg by mouth daily.       . Omeprazole (PRILOSEC PO) Take by mouth.      . oxyCODONE (OXY IR/ROXICODONE) 5 MG immediate release tablet Take 1-2 tablets (5-10 mg total) by mouth every 4 (four) hours as needed for pain.  50 tablet  0  . promethazine (PHENERGAN) 25 MG tablet Take 25 mg by mouth every 6 (six) hours as needed for nausea (nausea).      . ranitidine (ZANTAC) 75 MG tablet Take 75 mg by mouth 2 (two) times daily.         No current facility-administered medications for this visit.    Past Medical History  Diagnosis Date  . Hemiplegia affecting unspecified side, late effect of cerebrovascular disease   . Mitral valve insufficiency and aortic valve insufficiency   . Aortic insufficiency   . Dyslipidemia   . Hypertension   . Stroke   . Carotid stenosis     Right carotid stent (widely patent) 40 - 59% left plaque 11/13    Past Surgical History  Procedure Laterality Date  . Foot surgery      right  . Back surgery      lumbar back  . Polypectomy    . Tonsillectomy    . Laparoscopic cholecystectomy  single port N/A 06/01/2013    Procedure: LAPAROSCOPIC CHOLECYSTECTOMY SINGLE PORT;  Surgeon: Adin Hector, MD;  Location: [facility];  Service: General;  Laterality: N/A;  . Ercp N/A 05/31/2013    Procedure: ENDOSCOPIC RETROGRADE CHOLANGIOPANCREATOGRAPHY (ERCP);  Surgeon: Ladene Artist, MD;  Location: [facility];  Service: Endoscopy;  Laterality: N/A;    Family History  Problem Relation Age of Onset  . Stroke Father     No details  . Angina Mother     History   Social History  . Marital Status: Married    Spouse Name: N/A    Number of Children: 3  . Years of Education: N/A   Occupational History  .     Social History Main Topics  . Smoking status: Former Smoker    Types: Cigarettes    Quit date: 07/15/2011  . Smokeless tobacco: Never Used     Comment: Quit tobacco in December  . Alcohol Use: Yes     Comment: moderate wine  .  Drug Use: No  . Sexual Activity: Not on file   Other Topics Concern  . Not on file   Social History Narrative   Two living children.  Lives with wife.      Review of Systems:  All systems reviewed.  They are negative to the above problem except as previously stated.  Vital Signs: BP 136/61  Pulse 61  Ht 6' (1.829 m)  Wt 270 lb (122.471 kg)  BMI 36.61 kg/m2  Physical Exam Morbidly obese 68 yo in NAD HEENT:  Normocephalic, atraumatic. EOMI, PERRLA.  Neck: JVP is normal.  No bruits.  Lungs: clear to auscultation. No rales no wheezes.  Heart: Regular rate and rhythm. Normal S1, S2. No S3.   Gr II/VI systolic murmur RUSB  Gr I/Vi systolic murmur LSB. PMI not displaced.  Abdomen:  Supple, nontender. Normal bowel sounds. No masses. No hepatomegaly.  Extremities:   Good distal pulses throughout. No lower extremity edema.  Musculoskeletal :moving all extremities.  Neuro:   alert and oriented x3.  CN II-XII grossly intact.  EKG  Sinus rhythm  61 bpm   Occasional PVC  Assessment and Plan:  1.  CV disease  Carotid USN in Nov there was mild  increase in plaquing in L carotid which is moderate.   Follow.  2.  HL  Lipid panel in July was very good  LDL 67 HDL 43  Lipomed in past was very good  Would continue advicor.  3.  Obesity  Encouraged him to watch calories.  Increase activity.   Bike more days of week     4.  HTN Adequate control  5.  Aortic stenosis  Will set up for repeat echo

## 2014-07-21 ENCOUNTER — Encounter: Payer: Self-pay | Admitting: Internal Medicine

## 2014-07-21 ENCOUNTER — Ambulatory Visit (INDEPENDENT_AMBULATORY_CARE_PROVIDER_SITE_OTHER): Payer: Medicare Other | Admitting: Internal Medicine

## 2014-07-21 VITALS — BP 136/61 | HR 61 | Ht 72.0 in | Wt 270.0 lb

## 2014-07-21 DIAGNOSIS — I10 Essential (primary) hypertension: Secondary | ICD-10-CM

## 2014-07-21 DIAGNOSIS — I350 Nonrheumatic aortic (valve) stenosis: Secondary | ICD-10-CM

## 2014-07-21 DIAGNOSIS — I359 Nonrheumatic aortic valve disorder, unspecified: Secondary | ICD-10-CM

## 2014-07-21 NOTE — Patient Instructions (Signed)
Your physician recommends that you continue on your current medications as directed. Please refer to the Current Medication list given to you today.  Your physician has requested that you have an echocardiogram. Echocardiography is a painless test that uses sound waves to create images of your heart. It provides your doctor with information about the size and shape of your heart and how well your heart's chambers and valves are working. This procedure takes approximately one hour. There are no restrictions for this procedure.  Your physician wants you to follow-up in: 1 year. You will receive a reminder letter in the mail two months in advance. If you don't receive a letter, please call our office to schedule the follow-up appointment.

## 2014-07-30 ENCOUNTER — Ambulatory Visit (HOSPITAL_COMMUNITY): Payer: Medicare Other | Attending: Cardiovascular Disease | Admitting: Cardiology

## 2014-07-30 DIAGNOSIS — I519 Heart disease, unspecified: Secondary | ICD-10-CM | POA: Diagnosis not present

## 2014-07-30 DIAGNOSIS — I080 Rheumatic disorders of both mitral and aortic valves: Secondary | ICD-10-CM | POA: Diagnosis not present

## 2014-07-30 DIAGNOSIS — I359 Nonrheumatic aortic valve disorder, unspecified: Secondary | ICD-10-CM | POA: Diagnosis not present

## 2014-07-30 DIAGNOSIS — I350 Nonrheumatic aortic (valve) stenosis: Secondary | ICD-10-CM

## 2014-07-30 NOTE — Progress Notes (Signed)
Echo performed. 

## 2014-08-01 ENCOUNTER — Ambulatory Visit: Payer: Medicare Other | Admitting: Internal Medicine

## 2014-08-06 ENCOUNTER — Other Ambulatory Visit: Payer: Self-pay | Admitting: *Deleted

## 2014-08-06 DIAGNOSIS — I359 Nonrheumatic aortic valve disorder, unspecified: Secondary | ICD-10-CM

## 2014-08-11 ENCOUNTER — Other Ambulatory Visit (HOSPITAL_COMMUNITY): Payer: Medicare Other | Admitting: *Deleted

## 2014-08-11 ENCOUNTER — Ambulatory Visit (HOSPITAL_COMMUNITY): Payer: Medicare Other | Attending: Internal Medicine | Admitting: Cardiology

## 2014-08-11 DIAGNOSIS — E785 Hyperlipidemia, unspecified: Secondary | ICD-10-CM | POA: Insufficient documentation

## 2014-08-11 DIAGNOSIS — Z87891 Personal history of nicotine dependence: Secondary | ICD-10-CM | POA: Diagnosis not present

## 2014-08-11 DIAGNOSIS — I10 Essential (primary) hypertension: Secondary | ICD-10-CM | POA: Insufficient documentation

## 2014-08-11 DIAGNOSIS — I39 Endocarditis and heart valve disorders in diseases classified elsewhere: Secondary | ICD-10-CM

## 2014-08-11 DIAGNOSIS — I339 Acute and subacute endocarditis, unspecified: Secondary | ICD-10-CM

## 2014-08-11 DIAGNOSIS — E669 Obesity, unspecified: Secondary | ICD-10-CM | POA: Insufficient documentation

## 2014-08-11 DIAGNOSIS — I359 Nonrheumatic aortic valve disorder, unspecified: Secondary | ICD-10-CM | POA: Diagnosis not present

## 2014-08-11 MED ORDER — PERFLUTREN PROTEIN A MICROSPH IV SUSP
3.0000 mL | Freq: Once | INTRAVENOUS | Status: AC
  Administered 2014-08-11: 3 mL via INTRAVENOUS

## 2014-08-11 NOTE — Progress Notes (Signed)
Limited echo with contrast for wall motion performed.

## 2014-09-02 DIAGNOSIS — Z23 Encounter for immunization: Secondary | ICD-10-CM | POA: Diagnosis not present

## 2014-09-15 ENCOUNTER — Other Ambulatory Visit: Payer: Self-pay | Admitting: Internal Medicine

## 2014-11-17 ENCOUNTER — Other Ambulatory Visit: Payer: Self-pay | Admitting: Internal Medicine

## 2015-01-05 ENCOUNTER — Encounter: Payer: Self-pay | Admitting: Internal Medicine

## 2015-01-05 DIAGNOSIS — I2510 Atherosclerotic heart disease of native coronary artery without angina pectoris: Secondary | ICD-10-CM | POA: Diagnosis not present

## 2015-01-05 DIAGNOSIS — E782 Mixed hyperlipidemia: Secondary | ICD-10-CM | POA: Diagnosis not present

## 2015-01-05 DIAGNOSIS — F3342 Major depressive disorder, recurrent, in full remission: Secondary | ICD-10-CM | POA: Diagnosis not present

## 2015-01-05 DIAGNOSIS — I10 Essential (primary) hypertension: Secondary | ICD-10-CM | POA: Diagnosis not present

## 2015-01-05 DIAGNOSIS — I679 Cerebrovascular disease, unspecified: Secondary | ICD-10-CM | POA: Diagnosis not present

## 2015-01-05 DIAGNOSIS — R7309 Other abnormal glucose: Secondary | ICD-10-CM | POA: Diagnosis not present

## 2015-01-05 DIAGNOSIS — E669 Obesity, unspecified: Secondary | ICD-10-CM | POA: Diagnosis not present

## 2015-02-20 DIAGNOSIS — S99922A Unspecified injury of left foot, initial encounter: Secondary | ICD-10-CM | POA: Diagnosis not present

## 2015-02-20 DIAGNOSIS — Z23 Encounter for immunization: Secondary | ICD-10-CM | POA: Diagnosis not present

## 2015-04-08 ENCOUNTER — Telehealth: Payer: Self-pay

## 2015-04-08 DIAGNOSIS — E785 Hyperlipidemia, unspecified: Secondary | ICD-10-CM

## 2015-04-09 ENCOUNTER — Telehealth: Payer: Self-pay | Admitting: Internal Medicine

## 2015-04-09 MED ORDER — LOVASTATIN 20 MG PO TABS: 20.0000 mg | ORAL_TABLET | Freq: Every day | ORAL | Status: DC

## 2015-04-09 NOTE — Telephone Encounter (Signed)
Informed Elizabeth at [facility] out pt pharmacy of new order. She checked and lovastatin has been filled. Informed her patient and his wife are aware.

## 2015-04-09 NOTE — Telephone Encounter (Signed)
Have him try just lovastatin 20 mg alone now and see Check NMR lipomed in 3 months.   His lipids have been excellent  May not need combo

## 2015-04-09 NOTE — Telephone Encounter (Signed)
Called patient. Received permission from him to discuss with his wife, Katharine Look due to he is HOH especially over the phone.  Informed to stop advicor and start lovastatin. Aware that recheck of cholesterol is needed around end of July. Will either come to [facility] or have drawn at PCP and they will forward to Dr. Harrington Challenger.

## 2015-04-09 NOTE — Telephone Encounter (Signed)
New problem   Have question concerning pt's Advicor. Please call

## 2015-07-02 ENCOUNTER — Telehealth: Payer: Self-pay | Admitting: Internal Medicine

## 2015-07-02 NOTE — Telephone Encounter (Signed)
Pt's wife advised (with pt's verbal permission)  that it is fine to have NMR lipomed done 07/08/15 at Dr Jason Nest office, note sent to Dr Jason Nest office with this information and requested a copy of the results to Dr Harrington Challenger.

## 2015-07-02 NOTE — Telephone Encounter (Signed)
Newmessage  Pt wife calling to make recall appt- set for 9/22. Pt wife wanted to see if pt's cholesterol can be tested at pt's PCP- Dr. Justin Mend @ [facility]. Please call back and discuss.

## 2015-07-08 DIAGNOSIS — R7309 Other abnormal glucose: Secondary | ICD-10-CM | POA: Diagnosis not present

## 2015-07-08 DIAGNOSIS — Z0000 Encounter for general adult medical examination without abnormal findings: Secondary | ICD-10-CM | POA: Diagnosis not present

## 2015-07-08 DIAGNOSIS — F3342 Major depressive disorder, recurrent, in full remission: Secondary | ICD-10-CM | POA: Diagnosis not present

## 2015-07-08 DIAGNOSIS — I679 Cerebrovascular disease, unspecified: Secondary | ICD-10-CM | POA: Diagnosis not present

## 2015-07-08 DIAGNOSIS — I10 Essential (primary) hypertension: Secondary | ICD-10-CM | POA: Diagnosis not present

## 2015-07-08 DIAGNOSIS — I2510 Atherosclerotic heart disease of native coronary artery without angina pectoris: Secondary | ICD-10-CM | POA: Diagnosis not present

## 2015-07-08 DIAGNOSIS — I739 Peripheral vascular disease, unspecified: Secondary | ICD-10-CM | POA: Diagnosis not present

## 2015-07-08 DIAGNOSIS — E782 Mixed hyperlipidemia: Secondary | ICD-10-CM | POA: Diagnosis not present

## 2015-07-08 DIAGNOSIS — Z125 Encounter for screening for malignant neoplasm of prostate: Secondary | ICD-10-CM | POA: Diagnosis not present

## 2015-07-13 ENCOUNTER — Encounter: Payer: Self-pay | Admitting: Internal Medicine

## 2015-07-24 DIAGNOSIS — I10 Essential (primary) hypertension: Secondary | ICD-10-CM | POA: Diagnosis not present

## 2015-09-03 ENCOUNTER — Encounter: Payer: Self-pay | Admitting: Internal Medicine

## 2015-09-03 ENCOUNTER — Ambulatory Visit (INDEPENDENT_AMBULATORY_CARE_PROVIDER_SITE_OTHER): Payer: Medicare Other | Admitting: Internal Medicine

## 2015-09-03 VITALS — BP 136/68 | HR 59 | Ht 72.0 in | Wt 243.0 lb

## 2015-09-03 DIAGNOSIS — I739 Peripheral vascular disease, unspecified: Secondary | ICD-10-CM

## 2015-09-03 DIAGNOSIS — I359 Nonrheumatic aortic valve disorder, unspecified: Secondary | ICD-10-CM | POA: Diagnosis not present

## 2015-09-03 DIAGNOSIS — I779 Disorder of arteries and arterioles, unspecified: Secondary | ICD-10-CM | POA: Diagnosis not present

## 2015-09-03 NOTE — Progress Notes (Signed)
Cardiology Office Note   Date:  09/03/2015   ID:  CANDY ARAMBUL, DOB [birth year], MRN [id number]  PCP:  Hulen Shouts, MD  Cardiologist:   Dorris Carnes, MD   No chief complaint on file.     History of Present Illness: Brandon [name] is a 69 y.o. male with a history of  CV disease (s/p PTA of R carotid in 2000), HL , HTN and mild aortic stenosis I saw him in winter 2014 No CP No SOB No dizziness No numbness Does 3 t o4 miles on stationary bike     Current Outpatient Prescriptions  Medication Sig Dispense Refill  . aspirin EC 81 MG tablet Take 81 mg by mouth daily.      [name] buPROPion (WELLBUTRIN SR) 150 MG 12 hr tablet Take 150 mg by mouth 2 (two) times daily.     . citalopram (CELEXA) 20 MG tablet Take 20 mg by mouth daily.     [name] EPIPEN 2-PAK 0.3 MG/0.3ML SOAJ injection As directed    . lisinopril (PRINIVIL,ZESTRIL) 10 MG tablet Take 1 tablet (10 mg total) by mouth daily. 90 tablet 3  . lovastatin (MEVACOR) 20 MG tablet Take 1 tablet (20 mg total) by mouth at bedtime. 90 tablet 3  . metoprolol (TOPROL-XL) 50 MG 24 hr tablet Take 50 mg by mouth daily.     . Omeprazole (PRILOSEC PO) Take by mouth.    . oxyCODONE (OXY IR/ROXICODONE) 5 MG immediate release tablet Take 1-2 tablets (5-10 mg total) by mouth every 4 (four) hours as needed for pain. 50 tablet 0  . promethazine (PHENERGAN) 25 MG tablet Take 25 mg by mouth every 6 (six) hours as needed for nausea (nausea).    . ranitidine (ZANTAC) 75 MG tablet Take 75 mg by mouth 2 (two) times daily.       No current facility-administered medications for this visit.    Allergies:   Bee venom   Past Medical History  Diagnosis Date  . Hemiplegia affecting unspecified side, late effect of cerebrovascular disease   . Mitral valve insufficiency and aortic valve insufficiency   . Aortic insufficiency   . Dyslipidemia   . Hypertension   . Stroke   . Carotid stenosis     Right carotid stent (widely patent) 40 - 59% left  plaque 11/13    Past Surgical History  Procedure Laterality Date  . Foot surgery      right  . Back surgery      lumbar back  . Polypectomy    . Tonsillectomy    . Laparoscopic cholecystectomy single port N/A 06/01/2013    Procedure: LAPAROSCOPIC CHOLECYSTECTOMY SINGLE PORT;  Surgeon: Adin Hector, MD;  Location: [facility];  Service: General;  Laterality: N/A;  . Ercp N/A 05/31/2013    Procedure: ENDOSCOPIC RETROGRADE CHOLANGIOPANCREATOGRAPHY (ERCP);  Surgeon: Ladene Artist, MD;  Location: [facility];  Service: Endoscopy;  Laterality: N/A;     Social History:  The patient  reports that he quit smoking about 4 years ago. His smoking use included Cigarettes. He has never used smokeless tobacco. He reports that he drinks alcohol. He reports that he does not use illicit drugs.   Family History:  The patient's family history includes Angina in his mother; Stroke in his father.    ROS:  Please see the history of present illness. All other systems are reviewed and  Negative to the above problem except as noted.    PHYSICAL EXAM: VS:  There were no vitals taken for this visit.  GEN: Well nourished, well developed, in no acute distress HEENT: normal Neck: no JVD, Murmurs bilaterally   Cardiac: RRR; Gr III/VI systolic murmur  No rubs, or gallops,no edema  Respiratory:  clear to auscultation bilaterally, normal work of breathing GI: soft, nontender, nondistended, + BS  No hepatomegaly  MS: no deformity Moving all extremities   Skin: warm and dry, no rash Neuro:  Strength and sensation are intact Psych: euthymic mood, full affect   EKG:  EKG is ordered today.Sinus bradycardia 59 bpm     Lipid Panel    Component Value Date/Time   CHOL 120 12/27/2012 0912   TRIG 115 12/27/2012 0912   HDL 40 12/27/2012 0912   LDLCALC 57 12/27/2012 0912      Wt Readings from Last 3 Encounters:  07/21/14 270 lb (122.471 kg)  06/11/13 268 lb 6.4 oz (121.745 kg)  05/29/13 254 lb (115.214 kg)       ASSESSMENT AND PLAN:  1  CV dz  F/U carotids this fall with USN  2.  Aortic stenosis  Mild to mod in 2015  Will repeat echo next summer  3.  HL  Good control of LDL    Encouraged him to stay acitve   He is doing work in yard and walking and biking       Signed, Dorris Carnes, MD  09/03/2015 [address], [place], [place]  [address] Phone: [phone]; Fax: [phone]

## 2015-09-03 NOTE — Patient Instructions (Signed)
Your physician recommends that you continue on your current medications as directed. Please refer to the Current Medication list given to you today. Your physician has requested that you have an echocardiogram. Echocardiography is a painless test that uses sound waves to create images of your heart. It provides your doctor with information about the size and shape of your heart and how well your heart's chambers and valves are working. This procedure takes approximately one hour. There are no restrictions for this procedure. PLEASE SCHEDULE FOR NEXT AUGUST.  YOU NEED TO GET THE ECHO PRIOR TO YOUR NEXT APPOINTMENT WITH DR ROSS.  Your physician has requested that you have a carotid duplex. This test is an ultrasound of the carotid arteries in your neck. It looks at blood flow through these arteries that supply the brain with blood. Allow one hour for this exam. There are no restrictions or special instructions.  Your physician wants you to follow-up in: AUGUST 2017 WITH DR. Harrington Challenger.  You will receive a reminder letter in the mail two months in advance. If you don't receive a letter, please call our office to schedule the follow-up appointment.

## 2015-09-07 ENCOUNTER — Ambulatory Visit (HOSPITAL_COMMUNITY)
Admission: RE | Admit: 2015-09-07 | Discharge: 2015-09-07 | Disposition: A | Discharge status: Home / Self Care | Payer: Medicare Other | Source: Ambulatory Visit | Attending: Cardiovascular Disease | Admitting: Cardiovascular Disease

## 2015-09-07 DIAGNOSIS — E785 Hyperlipidemia, unspecified: Secondary | ICD-10-CM | POA: Diagnosis not present

## 2015-09-07 DIAGNOSIS — I359 Nonrheumatic aortic valve disorder, unspecified: Secondary | ICD-10-CM

## 2015-09-07 DIAGNOSIS — I779 Disorder of arteries and arterioles, unspecified: Secondary | ICD-10-CM | POA: Diagnosis not present

## 2015-09-07 DIAGNOSIS — I6523 Occlusion and stenosis of bilateral carotid arteries: Secondary | ICD-10-CM | POA: Insufficient documentation

## 2015-09-07 DIAGNOSIS — I10 Essential (primary) hypertension: Secondary | ICD-10-CM | POA: Insufficient documentation

## 2015-09-07 DIAGNOSIS — I739 Peripheral vascular disease, unspecified: Secondary | ICD-10-CM

## 2015-10-02 DIAGNOSIS — Z23 Encounter for immunization: Secondary | ICD-10-CM | POA: Diagnosis not present

## 2016-01-11 DIAGNOSIS — R7309 Other abnormal glucose: Secondary | ICD-10-CM | POA: Diagnosis not present

## 2016-01-11 DIAGNOSIS — I10 Essential (primary) hypertension: Secondary | ICD-10-CM | POA: Diagnosis not present

## 2016-01-11 DIAGNOSIS — E782 Mixed hyperlipidemia: Secondary | ICD-10-CM | POA: Diagnosis not present

## 2016-01-11 DIAGNOSIS — Z720 Tobacco use: Secondary | ICD-10-CM | POA: Diagnosis not present

## 2016-01-11 DIAGNOSIS — I2510 Atherosclerotic heart disease of native coronary artery without angina pectoris: Secondary | ICD-10-CM | POA: Diagnosis not present

## 2016-01-11 MED FILL — OMEPRAZOLE DR 20 MG CAPSULE: 20 | 90 days supply | Qty: 90 | Fill #1

## 2016-01-11 MED FILL — LOVASTATIN 20 MG TABLET: 20 | 90 days supply | Qty: 90 | Fill #1

## 2016-01-18 MED FILL — CITALOPRAM HBR 20 MG TABLET: 20 | 90 days supply | Qty: 90 | Fill #1

## 2016-01-18 MED FILL — EPINEPHRINE 0.3 MG AUTO-INJ: 0.3 | 25 days supply | Qty: 2 | Fill #0

## 2016-02-22 MED FILL — LISINOPRIL 20 MG TABLET: 20 | 90 days supply | Qty: 90 | Fill #1

## 2016-03-18 MED FILL — BUPROPION HCL SR 150 MG TAB: 150 | 90 days supply | Qty: 180 | Fill #0

## 2016-04-04 MED FILL — OMEPRAZOLE DR 20 MG CAPSULE: 20 | 90 days supply | Qty: 90 | Fill #2

## 2016-04-14 MED FILL — CITALOPRAM HBR 20 MG TABLET: 20 | 90 days supply | Qty: 90 | Fill #2

## 2016-04-15 ENCOUNTER — Other Ambulatory Visit: Payer: Self-pay | Admitting: Internal Medicine

## 2016-04-15 MED FILL — LOVASTATIN 20 MG TABLET: 20 | 90 days supply | Qty: 90 | Fill #0

## 2016-05-30 MED FILL — LISINOPRIL 20 MG TABLET: 20 | 90 days supply | Qty: 90 | Fill #2

## 2016-06-13 MED FILL — BUPROPION HCL SR 150 MG TAB: 150 | 90 days supply | Qty: 180 | Fill #0

## 2016-07-11 ENCOUNTER — Other Ambulatory Visit: Payer: Self-pay | Admitting: Internal Medicine

## 2016-07-11 MED FILL — OMEPRAZOLE DR 20 MG CAPSULE: 20 | 90 days supply | Qty: 90 | Fill #0

## 2016-07-11 MED FILL — LOVASTATIN 20 MG TABLET: 20 | 90 days supply | Qty: 90 | Fill #0

## 2016-07-11 MED FILL — CITALOPRAM HBR 20 MG TABLET: 20 | 90 days supply | Qty: 90 | Fill #0

## 2016-07-20 ENCOUNTER — Encounter: Payer: Self-pay | Admitting: Internal Medicine

## 2016-08-19 ENCOUNTER — Encounter: Payer: Self-pay | Admitting: Internal Medicine

## 2016-08-26 ENCOUNTER — Other Ambulatory Visit: Payer: Self-pay

## 2016-08-26 ENCOUNTER — Ambulatory Visit (HOSPITAL_COMMUNITY): Payer: Medicare HMO | Attending: Cardiology

## 2016-08-26 DIAGNOSIS — I779 Disorder of arteries and arterioles, unspecified: Secondary | ICD-10-CM | POA: Insufficient documentation

## 2016-08-26 DIAGNOSIS — E785 Hyperlipidemia, unspecified: Secondary | ICD-10-CM | POA: Diagnosis not present

## 2016-08-26 DIAGNOSIS — R011 Cardiac murmur, unspecified: Secondary | ICD-10-CM | POA: Insufficient documentation

## 2016-08-26 DIAGNOSIS — I071 Rheumatic tricuspid insufficiency: Secondary | ICD-10-CM | POA: Diagnosis not present

## 2016-08-26 DIAGNOSIS — I359 Nonrheumatic aortic valve disorder, unspecified: Secondary | ICD-10-CM | POA: Diagnosis not present

## 2016-08-26 DIAGNOSIS — I10 Essential (primary) hypertension: Secondary | ICD-10-CM | POA: Insufficient documentation

## 2016-08-26 DIAGNOSIS — I351 Nonrheumatic aortic (valve) insufficiency: Secondary | ICD-10-CM | POA: Insufficient documentation

## 2016-08-26 DIAGNOSIS — I739 Peripheral vascular disease, unspecified: Secondary | ICD-10-CM

## 2016-08-26 DIAGNOSIS — Z8673 Personal history of transient ischemic attack (TIA), and cerebral infarction without residual deficits: Secondary | ICD-10-CM | POA: Diagnosis not present

## 2016-08-26 DIAGNOSIS — I340 Nonrheumatic mitral (valve) insufficiency: Secondary | ICD-10-CM | POA: Insufficient documentation

## 2016-08-26 MED ORDER — PERFLUTREN LIPID MICROSPHERE
1.0000 mL | INTRAVENOUS | Status: AC | PRN
  Administered 2016-08-26: 3 mL via INTRAVENOUS

## 2016-08-29 MED FILL — LISINOPRIL 20 MG TABLET: 20 | 90 days supply | Qty: 90 | Fill #0

## 2016-08-31 ENCOUNTER — Telehealth: Payer: Self-pay | Admitting: Internal Medicine

## 2016-08-31 NOTE — Telephone Encounter (Signed)
New message   Pt wife is calling to see if [place] Dr[facility] office has sent over his lab results

## 2016-08-31 NOTE — Telephone Encounter (Signed)
Wife calling wanting to make sure labs were received from his PCP at [facility] before seeing Dr. Harrington Challenger on Fri 9/22.  Advised Dr. Harrington Challenger had reviewed and were in chart. Advised he needs to sign DPR form for [place] to discuss his health issues. She states he has signed 2 forms.

## 2016-08-31 NOTE — Progress Notes (Signed)
Cardiology Office Note   Date:  09/02/2016   ID:  Brandon [name], DOB Feb 27, 1946, MRN [id number]  PCP:  Jonathon Bellows, MD  Cardiologist:   Dorris Carnes, MD      F/P of HTN and HL  History of Present Illness: Brandon [name] is a 70 y.o. male with a history of CVA in 2000  S/p PTA  Also history of HTN, HL, mild aortic stenosis.  I saw him in Sept 2016    Since seen  Walking more   NO CP  Breathign is OK  Allergies at times   Still smoking some   Denies numbness, weakness, TIA symptoms  Outpatient Medications Prior to Visit  Medication Sig Dispense Refill  . aspirin EC 81 MG tablet Take 81 mg by mouth daily.      [name] buPROPion (WELLBUTRIN SR) 150 MG 12 hr tablet Take 150 mg by mouth 2 (two) times daily.     . citalopram (CELEXA) 20 MG tablet Take 20 mg by mouth daily.     [name] EPIPEN 2-PAK 0.3 MG/0.3ML SOAJ injection As directed    . lisinopril (PRINIVIL,ZESTRIL) 10 MG tablet Take 1 tablet (10 mg total) by mouth daily. 90 tablet 3  . metoprolol (TOPROL-XL) 50 MG 24 hr tablet Take 50 mg by mouth daily.     [name] lovastatin (MEVACOR) 20 MG tablet TAKE 1 TABLET BY MOUTH AT BEDTIME. 90 tablet 0  . Omeprazole (PRILOSEC PO) Take 1 tablet by mouth daily.     [name] oxyCODONE (OXY IR/ROXICODONE) 5 MG immediate release tablet Take 1-2 tablets (5-10 mg total) by mouth every 4 (four) hours as needed for pain. 50 tablet 0  . promethazine (PHENERGAN) 25 MG tablet Take 25 mg by mouth every 6 (six) hours as needed for nausea (nausea).    . ranitidine (ZANTAC) 75 MG tablet Take 75 mg by mouth 2 (two) times daily.       No facility-administered medications prior to visit.      Allergies:   Bee venom   Past Medical History:  Diagnosis Date  . Aortic insufficiency   . Carotid stenosis    Right carotid stent (widely patent) 40 - 59% left plaque 11/13  . Dyslipidemia   . Hemiplegia affecting unspecified side, late effect of cerebrovascular disease   . Hypertension   . Mitral valve insufficiency and  aortic valve insufficiency   . Stroke [facility])     Past Surgical History:  Procedure Laterality Date  . BACK SURGERY     lumbar back  . ERCP N/A 05/31/2013   Procedure: ENDOSCOPIC RETROGRADE CHOLANGIOPANCREATOGRAPHY (ERCP);  Surgeon: Ladene Artist, MD;  Location: [facility];  Service: Endoscopy;  Laterality: N/A;  . FOOT SURGERY     right  . LAPAROSCOPIC CHOLECYSTECTOMY SINGLE PORT N/A 06/01/2013   Procedure: LAPAROSCOPIC CHOLECYSTECTOMY SINGLE PORT;  Surgeon: Adin Hector, MD;  Location: [facility];  Service: General;  Laterality: N/A;  . POLYPECTOMY    . TONSILLECTOMY       Social History:  The patient  reports that he quit smoking about 5 years ago. His smoking use included Cigarettes. He has never used smokeless tobacco. He reports that he drinks alcohol. He reports that he does not use drugs.   Family History:  The patient's family history includes Angina in his mother; Stroke in his father.    ROS:  Please see the history of present illness. All other systems are reviewed and  Negative to the  above problem except as noted.    PHYSICAL EXAM: VS:  BP 132/64   Pulse 64   Ht 6' (1.829 m)   Wt 249 lb 6.4 oz (113.1 kg)   BMI 33.82 kg/m   GEN: Well nourished, well developed, in no acute distress  HEENT: normal  Neck: no JVD, carotid bruits, or masses Cardiac: RRR; Gr II/VI sytolc murmur base  Gr I/Vi diastolic murmur  , rubs, or gallops,no edema  Respiratory:  clear to auscultation bilaterally, normal work of breathing GI: soft, nontender, nondistended, + BS  No hepatomegaly  MS: no deformity Moving all extremities   Skin: warm and dry, no rash Neuro:  Strength and sensation are intact Psych: euthymic mood, full affect   EKG:  EKG is ordered today.  SR 654  ONonspecific ST changes      Lipid Panel    Component Value Date/Time   CHOL 120 12/27/2012 0912   TRIG 115 12/27/2012 0912   HDL 40 12/27/2012 0912   LDLCALC 57 12/27/2012 0912      Wt Readings from Last 3  Encounters:  09/02/16 249 lb 6.4 oz (113.1 kg)  09/03/15 243 lb (110.2 kg)  07/21/14 270 lb (122.5 kg)      ASSESSMENT AND PLAN:  1  CVA  Will set up for repteat carotid USN  2  AS  Mod by echo  Will compare to previous  3  Mld LV dysfunction  Need to compare to previous    4  HL  Lipids good  LDL 79    5  HTN  Good control  6  Tob  Counselled on cessation  Down to 1 pper week   F/U in 1 year     Current medicines are reviewed at length with the patient today.  The patient does not have concerns regarding medicines.  Signed, Dorris Carnes, MD  09/02/2016 10:58 AM    [address], [place], [place]  [address] Phone: [phone]; Fax: [phone]

## 2016-09-02 ENCOUNTER — Ambulatory Visit (INDEPENDENT_AMBULATORY_CARE_PROVIDER_SITE_OTHER): Payer: Medicare HMO | Admitting: Internal Medicine

## 2016-09-02 ENCOUNTER — Encounter: Payer: Self-pay | Admitting: Internal Medicine

## 2016-09-02 VITALS — BP 132/64 | HR 64 | Ht 72.0 in | Wt 249.4 lb

## 2016-09-02 DIAGNOSIS — I2510 Atherosclerotic heart disease of native coronary artery without angina pectoris: Secondary | ICD-10-CM

## 2016-09-02 LAB — CBC
HCT: 39.3 % (ref 38.5–50.0)
Hemoglobin: 13.6 g/dL (ref 13.2–17.1)
MCH: 32.9 pg (ref 27.0–33.0)
MCHC: 34.6 g/dL (ref 32.0–36.0)
MCV: 94.9 fL (ref 80.0–100.0)
MPV: 11.1 fL (ref 7.5–12.5)
PLATELETS: 256 10*3/uL (ref 140–400)
RBC: 4.14 MIL/uL — ABNORMAL LOW (ref 4.20–5.80)
RDW: 13.2 % (ref 11.0–15.0)
WBC: 7.8 10*3/uL (ref 3.8–10.8)

## 2016-09-02 NOTE — Patient Instructions (Signed)
Medication Instructions:  Your physician recommends that you continue on your current medications as directed. Please refer to the Current Medication list given to you today.   Labwork: TODAY: CBC  Testing/Procedures: Your physician has requested that you have a carotid duplex. This test is an ultrasound of the carotid arteries in your neck. It looks at blood flow through these arteries that supply the brain with blood. Allow one hour for this exam. There are no restrictions or special instructions.   Follow-Up: Your physician wants you to follow-up in: 1 year with Dr. Harrington Challenger.  You will receive a reminder letter in the mail two months in advance. If you don't receive a letter, please call our office to schedule the follow-up appointment.  Any Other Special Instructions Will Be Listed Below (If Applicable).     If you need a refill on your cardiac medications before your next appointment, please call your pharmacy.

## 2016-09-05 ENCOUNTER — Ambulatory Visit (HOSPITAL_COMMUNITY)
Admission: RE | Admit: 2016-09-05 | Discharge: 2016-09-05 | Disposition: A | Discharge status: Home / Self Care | Payer: Medicare HMO | Source: Ambulatory Visit | Attending: Internal Medicine | Admitting: Internal Medicine

## 2016-09-05 DIAGNOSIS — Z720 Tobacco use: Secondary | ICD-10-CM | POA: Diagnosis not present

## 2016-09-05 DIAGNOSIS — J449 Chronic obstructive pulmonary disease, unspecified: Secondary | ICD-10-CM | POA: Insufficient documentation

## 2016-09-05 DIAGNOSIS — E785 Hyperlipidemia, unspecified: Secondary | ICD-10-CM | POA: Insufficient documentation

## 2016-09-05 DIAGNOSIS — I10 Essential (primary) hypertension: Secondary | ICD-10-CM | POA: Insufficient documentation

## 2016-09-05 DIAGNOSIS — I779 Disorder of arteries and arterioles, unspecified: Secondary | ICD-10-CM | POA: Diagnosis present

## 2016-09-05 DIAGNOSIS — I2510 Atherosclerotic heart disease of native coronary artery without angina pectoris: Secondary | ICD-10-CM

## 2016-09-05 DIAGNOSIS — I6523 Occlusion and stenosis of bilateral carotid arteries: Secondary | ICD-10-CM | POA: Insufficient documentation

## 2016-09-07 ENCOUNTER — Telehealth: Payer: Self-pay | Admitting: Internal Medicine

## 2016-09-07 NOTE — Telephone Encounter (Signed)
Wife calling back regarding test results.  Notified her with permission from Brandon [name] of carotid results. Placed note in system that may speak with her regarding his care.

## 2016-09-07 NOTE — Telephone Encounter (Signed)
New message    Pts wife returning nurse call to get results.

## 2016-09-26 ENCOUNTER — Other Ambulatory Visit: Payer: Self-pay | Admitting: Internal Medicine

## 2016-09-27 MED FILL — BUPROPION SR 150 MG TABLET: 150 | 90 days supply | Qty: 180 | Fill #0

## 2016-09-27 NOTE — Telephone Encounter (Signed)
Left a message for patient to call back. 

## 2016-09-27 NOTE — Telephone Encounter (Signed)
This medication was removed from the patients med list with a reason of change in therapy at his last office visit.  Also patient has not had a lipid panel in over one year.  Discontinued Medications    Reason for Discontinue  lovastatin (MEVACOR) 20 MG tablet Change in therapy  Omeprazole (PRILOSEC PO) Change in therapy  oxyCODONE (OXY IR/ROXICODONE) 5 MG immediate release tablet Change in therapy  promethazine (PHENERGAN) 25 MG tablet Change in therapy  ranitidine (ZANTAC) 75 MG tablet Change in therapy   Please advise on request. Thanks, MI

## 2016-09-28 NOTE — Telephone Encounter (Signed)
Patient's wife called back.  Pt is still taking lovastatin 20 mg qhs.  It is still on his medicine list at last OV with Dr. Harrington Challenger who recommended no changes at that time.  Medication reordered.

## 2016-10-17 MED FILL — OMEPRAZOLE 20 MG CAPSULE DR: 20 | 90 days supply | Qty: 90 | Fill #1

## 2016-10-17 MED FILL — LOVASTATIN 20 MG TABLET: 20 | 90 days supply | Qty: 90 | Fill #0

## 2016-10-17 MED FILL — CITALOPRAM HBR 20 MG TABLET: 20 | 90 days supply | Qty: 90 | Fill #1

## 2016-11-22 MED FILL — LISINOPRIL 20 MG TABLET: 20 | 90 days supply | Qty: 90 | Fill #1

## 2016-12-20 MED FILL — BUPROPION SR 150 MG TABLET: 150 | 90 days supply | Qty: 180 | Fill #1

## 2017-01-04 ENCOUNTER — Other Ambulatory Visit: Payer: Self-pay | Admitting: *Deleted

## 2017-01-04 DIAGNOSIS — I350 Nonrheumatic aortic (valve) stenosis: Secondary | ICD-10-CM

## 2017-01-18 MED FILL — LOVASTATIN 20 MG TABLET: 20 | 90 days supply | Qty: 90 | Fill #1

## 2017-01-18 MED FILL — CITALOPRAM HBR 20 MG TABLET: 20 | 90 days supply | Qty: 90 | Fill #2

## 2017-01-18 MED FILL — OMEPRAZOLE 20 MG CAPSULE DR: 20 | 90 days supply | Qty: 90 | Fill #2

## 2017-02-01 DIAGNOSIS — R972 Elevated prostate specific antigen [PSA]: Secondary | ICD-10-CM | POA: Diagnosis not present

## 2017-02-13 MED FILL — LISINOPRIL 20 MG TABLET: 20 | 90 days supply | Qty: 90 | Fill #2

## 2017-03-22 DIAGNOSIS — M25512 Pain in left shoulder: Secondary | ICD-10-CM | POA: Diagnosis not present

## 2017-03-29 ENCOUNTER — Other Ambulatory Visit: Payer: Self-pay | Admitting: *Deleted

## 2017-03-29 MED ORDER — LOVASTATIN 20 MG PO TABS: 20.0000 mg | ORAL_TABLET | Freq: Every day | ORAL | 2 refills | Status: DC

## 2017-03-29 NOTE — Telephone Encounter (Signed)
Wife called for refills for lovastatin to be sent to [facility]

## 2017-04-05 DIAGNOSIS — M67912 Unspecified disorder of synovium and tendon, left shoulder: Secondary | ICD-10-CM | POA: Diagnosis not present

## 2017-04-10 DIAGNOSIS — M25512 Pain in left shoulder: Secondary | ICD-10-CM | POA: Diagnosis not present

## 2017-04-12 DIAGNOSIS — M75122 Complete rotator cuff tear or rupture of left shoulder, not specified as traumatic: Secondary | ICD-10-CM | POA: Diagnosis not present

## 2017-04-17 ENCOUNTER — Other Ambulatory Visit: Payer: Self-pay | Admitting: Orthopedic Surgery

## 2017-04-18 ENCOUNTER — Telehealth: Payer: Self-pay | Admitting: Internal Medicine

## 2017-04-18 NOTE — Telephone Encounter (Signed)
Faxed via EPIC to Dr. Tamera Punt, attn Clayborne Dana, surgery scheduler [phone]

## 2017-04-18 NOTE — Telephone Encounter (Signed)
Pt last seen 09/02/16. Will route to Dr. Harrington Challenger for review/recommendations.

## 2017-04-18 NOTE — Telephone Encounter (Signed)
Request for surgical clearance:  1. What type of surgery is being performed? Shoulder Scope  2. When is this surgery scheduled? 04-27-17   3. Are there any medications that need to be held prior to surgery and how long?General Cardiac Clearance  4. Name of physician performing surgery? Dr Tania Ade  5. What is your office phone and fax number? [phone] and fax is [phone]

## 2017-04-18 NOTE — Telephone Encounter (Signed)
Patient seen in Fall 2017   Doing good at time Hx of CVA (remote)  Also history of moderate aortic stenosis Assuming no major change in symptoms I think he is at low risk for major cardiac event and OK to proceed  Please forward to surgery office

## 2017-04-21 ENCOUNTER — Encounter (HOSPITAL_COMMUNITY)
Admission: RE | Admit: 2017-04-21 | Discharge: 2017-04-21 | Disposition: A | Discharge status: Home / Self Care | Payer: Medicare HMO | Source: Ambulatory Visit | Attending: Orthopedic Surgery | Admitting: Orthopedic Surgery

## 2017-04-21 ENCOUNTER — Encounter (HOSPITAL_COMMUNITY): Payer: Self-pay

## 2017-04-21 DIAGNOSIS — K219 Gastro-esophageal reflux disease without esophagitis: Secondary | ICD-10-CM | POA: Insufficient documentation

## 2017-04-21 DIAGNOSIS — Z7982 Long term (current) use of aspirin: Secondary | ICD-10-CM | POA: Diagnosis not present

## 2017-04-21 DIAGNOSIS — Z8673 Personal history of transient ischemic attack (TIA), and cerebral infarction without residual deficits: Secondary | ICD-10-CM | POA: Diagnosis not present

## 2017-04-21 DIAGNOSIS — Z01812 Encounter for preprocedural laboratory examination: Secondary | ICD-10-CM | POA: Diagnosis not present

## 2017-04-21 DIAGNOSIS — I10 Essential (primary) hypertension: Secondary | ICD-10-CM | POA: Insufficient documentation

## 2017-04-21 DIAGNOSIS — Z79899 Other long term (current) drug therapy: Secondary | ICD-10-CM | POA: Diagnosis not present

## 2017-04-21 DIAGNOSIS — F17200 Nicotine dependence, unspecified, uncomplicated: Secondary | ICD-10-CM | POA: Diagnosis not present

## 2017-04-21 DIAGNOSIS — E785 Hyperlipidemia, unspecified: Secondary | ICD-10-CM | POA: Insufficient documentation

## 2017-04-21 DIAGNOSIS — M75102 Unspecified rotator cuff tear or rupture of left shoulder, not specified as traumatic: Secondary | ICD-10-CM | POA: Diagnosis not present

## 2017-04-21 HISTORY — DX: Persons encountering health services in other specified circumstances: Z76.89

## 2017-04-21 HISTORY — DX: Depression, unspecified: F32.A

## 2017-04-21 HISTORY — DX: Unspecified osteoarthritis, unspecified site: M19.90

## 2017-04-21 HISTORY — DX: Anxiety disorder, unspecified: F41.9

## 2017-04-21 HISTORY — DX: Gastro-esophageal reflux disease without esophagitis: K21.9

## 2017-04-21 HISTORY — DX: Major depressive disorder, single episode, unspecified: F32.9

## 2017-04-21 HISTORY — DX: Cardiac murmur, unspecified: R01.1

## 2017-04-21 HISTORY — DX: Unspecified jaundice: R17

## 2017-04-21 LAB — BASIC METABOLIC PANEL
Anion gap: 10 (ref 5–15)
BUN: 23 mg/dL — AB (ref 6–20)
CALCIUM: 9.2 mg/dL (ref 8.9–10.3)
CO2: 19 mmol/L — ABNORMAL LOW (ref 22–32)
CREATININE: 1.18 mg/dL (ref 0.61–1.24)
Chloride: 108 mmol/L (ref 101–111)
GFR calc non Af Amer: >60 mL/min (ref >60)
Glucose, Bld: 108 mg/dL — ABNORMAL HIGH (ref 65–99)
Potassium: 5 mmol/L (ref 3.5–5.1)
Sodium: 137 mmol/L (ref 135–145)

## 2017-04-21 LAB — CBC
HCT: 38.2 % — ABNORMAL LOW (ref 39.0–52.0)
Hemoglobin: 12.5 g/dL — ABNORMAL LOW (ref 13.0–17.0)
MCH: 32.1 pg (ref 26.0–34.0)
MCHC: 32.7 g/dL (ref 30.0–36.0)
MCV: 98.2 fL (ref 78.0–100.0)
PLATELETS: 311 10*3/uL (ref 150–400)
RBC: 3.89 MIL/uL — AB (ref 4.22–5.81)
RDW: 13.6 % (ref 11.5–15.5)
WBC: 7.9 10*3/uL (ref 4.0–10.5)

## 2017-04-21 NOTE — Progress Notes (Signed)
Pt. Denies any changes or concerns in his chest &/or breathing. Pt. Followed by Dr. Lizbeth Bark for carotid stent & AS.  Pt. Cleared for surgery- by Dr. Lizbeth Bark.  Pt. Sees Dr. Justin Mend at [facility].

## 2017-04-21 NOTE — Pre-Procedure Instructions (Signed)
    Brandon [name]  04/21/2017      [place], [place] [address] [place] [address] Phone: [phone] Fax: [place], [place] [address] [place] [address] Phone: [phone] Fax: [phone]  [facility] [place], [place] [address] Phone: [phone] Fax: [phone]  [facility] [place], [place] - [address] [place] [address] [place] [address] [place] [address] [place] [address] [place] [address] Phone: [phone] Fax: [phone]    Your procedure is scheduled on Thursday, May 17th   Report to [facility] Admitting at 10:00 AM             (posted surgery time 12:08 pm - 1:57 pm)   Call this number if you have problems the morning of surgery:  [phone], other questions call  [phone] Mon-Fri from 8-4:30pm   Remember:  Do not eat food or drink liquids after midnight Wednesday.   Take these medicines the morning of surgery with A SIP OF WATER : Wellbutrin, Celexa, Tramadol.              4-5 days prior to surgery, STOP taking any Vitamins, Herbal Supplements, Anti-inflammatories.   Do not wear jewelry - no rings or watches.  Do not wear lotions, colognes or deoderant.             Men may shave face and neck.   Do not bring valuables to the hospital.  [facility] is not responsible for any belongings or valuables.  Contacts, dentures or bridgework may not be worn into surgery.  Leave your suitcase in the car.  After surgery it may be brought to your room.  For patients admitted to the hospital, discharge time will be determined by your treatment team.  Patients discharged the day of surgery will not be allowed to drive home, and will need someone to stay with you for the first 24 hrs.  Please read over the  following fact sheets that you were given. Pain Booklet and Surgical Site Infection Prevention

## 2017-04-24 ENCOUNTER — Encounter (HOSPITAL_COMMUNITY): Payer: Self-pay | Admitting: Emergency Medicine

## 2017-04-24 ENCOUNTER — Telehealth: Payer: Self-pay | Admitting: *Deleted

## 2017-04-24 DIAGNOSIS — I359 Nonrheumatic aortic valve disorder, unspecified: Secondary | ICD-10-CM

## 2017-04-24 NOTE — Telephone Encounter (Signed)
Received phone call from nurse practitioner Willeen Cass at [facility].  Pt is scheduled and has been cleared by Dr. Harrington Challenger for shoulder surgery on 04/27/17.  Levada Dy asked that Dr. Harrington Challenger review patient's echo from 08/26/17, specifically the decreased EF and akinesis.  She wanted to make sure that no ischemic testing needs to be done prior to his upcoming surgery.   I advised I would send Dr. Harrington Challenger a message to review and can forward the response to her via EPIC.

## 2017-04-24 NOTE — Progress Notes (Signed)
Anesthesia Chart Review:  Pt is a 71 year old male scheduled for L shoulder arthroscopy with rotator cuff repair and subacromial decompression on 04/27/2017 with Tania Ade, M.D.  - PCP is Maurice Small, MD - Cardiologist is Dorris Carnes, M.D. who has cleared patient for surgery.  PMH includes: stroke, carotid stenosis (s/p R ICA stent 2004), HTN, aortic stenosis, severe mitral regurgitation, dyslipidemia, GERD. Current smoker. BMI 34. S/P cholecystectomy 06/01/13.  Medications include: ASA 81 mg, lisinopril, lovastatin, Prilosec  Preoperative labs reviewed.  EKG 09/02/16: NSR. Nonspecific ST abnormality.  Carotid duplex 09/05/16: - Heterogeneous plaque, bilaterally. - Stable 1-39% ICA stenosis, bilaterally. - Stable > 50% ECA stenosis, bilaterally. - Normal subclavian arteries, bilaterally. - Patent vertebral arteries with antegrade low.  Echo 08/26/16:  - Left ventricle: The cavity size was mildly dilated. There was mild focal basal hypertrophy of the septum. Systolic function was mildly reduced. The estimated ejection fraction was in the range of 45% to 50%. There is akinesis of the basal-midinferolateral and inferior myocardium. Doppler parameters are consistent with abnormal left ventricular relaxation (grade 1 diastolic dysfunction). Doppler parameters are consistent with high ventricular filling pressure. - Aortic valve: Valve mobility was restricted. There was moderate stenosis. There was moderate regurgitation. - Ascending aorta: The ascending aorta was mildly dilated. - Mitral valve: Calcified annulus. There was severe regurgitation. - Left atrium: The atrium was mildly dilated. - Impressions: Definity used; akinesis of the basal inferior and inferior lateral wall with overall mildly reduced LV function; grade 1 diastolic dysfunction with elevated LV filling pressure; calcifed aortic valve with moderate AS (mean gradient 23 mmHg) and moderate AI; restricted posterior MV leaflet with  severe MR; mild LAE.  Willeen Cass, FNP-BC [facility] Phone: [phone] 04/24/2017 5:01 PM

## 2017-04-25 ENCOUNTER — Telehealth: Payer: Self-pay | Admitting: Internal Medicine

## 2017-04-25 NOTE — Telephone Encounter (Signed)
Patient has order for echo and appointment scheduled for July.  Will review with [facility] to have appointment moved up.  At that point we will schedule follow up with Dr. Harrington Challenger as well.    Spoke with patient's wife and Levada Dy in [place] Stay to update, who will update the surgeon.

## 2017-04-25 NOTE — Telephone Encounter (Signed)
Left message for Brandon [name] that Dr. Harrington Challenger has ordered an echo which is scheduled for this Thursday and she will see him next week to follow up/discuss.   Advised she will further decide on clearance for surgery at that visit.

## 2017-04-25 NOTE — Telephone Encounter (Signed)
New Message     Request for surgical clearance:  1. What type of surgery is being performed? Rt shoulder scope   2. When is this surgery scheduled? 04/27/17  3. Are there any medications that need to be held prior to surgery and how long? No   4. Name of physician performing surgery?  Dr Tania Ade   5. What is your office phone and fax number? [phone] fax [phone]    Do they need to cancel surgery? Or are you doing a  Stat echo ?

## 2017-04-25 NOTE — Telephone Encounter (Signed)
Echo has been scheduled for 5/17.

## 2017-04-25 NOTE — Telephone Encounter (Signed)
Sched pt to see me in clinic and to get an echo prior

## 2017-04-27 ENCOUNTER — Ambulatory Visit (HOSPITAL_COMMUNITY): Admission: RE | Admit: 2017-04-27 | Payer: Medicare HMO | Source: Ambulatory Visit | Admitting: Orthopedic Surgery

## 2017-04-27 ENCOUNTER — Encounter (HOSPITAL_COMMUNITY): Admission: RE | Payer: Self-pay | Source: Ambulatory Visit

## 2017-04-27 ENCOUNTER — Ambulatory Visit (HOSPITAL_COMMUNITY): Payer: Medicare HMO | Attending: Cardiovascular Disease

## 2017-04-27 ENCOUNTER — Other Ambulatory Visit: Payer: Self-pay

## 2017-04-27 DIAGNOSIS — I350 Nonrheumatic aortic (valve) stenosis: Secondary | ICD-10-CM | POA: Diagnosis not present

## 2017-04-27 LAB — ECHOCARDIOGRAM COMPLETE
AOVTI: 60.5 cm
AV Area VTI index: 0.52 cm2/m2
AV Area mean vel: 1.08 cm2
AV Mean grad: 20 mmHg
AV Peak grad: 36 mmHg
AV area mean vel ind: 0.46 cm2/m2
AV peak Index: 0.48
AVA: 1.23 cm2
AVAREAVTI: 1.13 cm2
AVCELMEANRAT: 0.34
AVPKVEL: 298 cm/s
Ao pk vel: 0.36 m/s
CHL CUP AV VEL: 1.23
CHL CUP MV DEC (S): 208
CHL CUP RV SYS PRESS: 32 mmHg
CHL CUP TV REG PEAK VELOCITY: 270 cm/s
DOP CAL AO MEAN VELOCITY: 204 cm/s
EWDT: 208 ms
FS: 23 % — AB (ref 28–44)
IV/PV OW: 1
LA diam end sys: 43 mm
LA diam index: 1.83 cm/m2
LASIZE: 43 mm
LAVOL: 108 mL
LAVOLA4C: 91 mL
LAVOLIN: 46 mL/m2
LDCA: 3.14 cm2
LVOT VTI: 23.7 cm
LVOT diameter: 20 mm
LVOT peak VTI: 0.39 cm
LVOTPV: 107 cm/s
LVOTSV: 74 mL
MV VTI: 188 cm
MV pk E vel: 121 m/s
MVPG: 6 mmHg
MVPKAVEL: 102 m/s
PISA EROA: 0.28 cm2
PW: 9.84 mm — AB (ref 0.6–1.1)
TR max vel: 270 cm/s
Valve area index: 0.52

## 2017-04-27 SURGERY — SHOULDER ARTHROSCOPY WITH ROTATOR CUFF REPAIR AND SUBACROMIAL DECOMPRESSION
Anesthesia: Choice | Laterality: Left

## 2017-04-27 MED ORDER — PERFLUTREN LIPID MICROSPHERE
1.0000 mL | INTRAVENOUS | Status: AC | PRN
  Administered 2017-04-27: 1 mL via INTRAVENOUS

## 2017-05-05 ENCOUNTER — Ambulatory Visit (INDEPENDENT_AMBULATORY_CARE_PROVIDER_SITE_OTHER): Payer: Medicare HMO | Admitting: Internal Medicine

## 2017-05-05 ENCOUNTER — Encounter: Payer: Self-pay | Admitting: *Deleted

## 2017-05-05 ENCOUNTER — Encounter: Payer: Self-pay | Admitting: Internal Medicine

## 2017-05-05 VITALS — BP 132/64 | HR 78 | Ht 72.0 in | Wt 252.0 lb

## 2017-05-05 DIAGNOSIS — I63019 Cerebral infarction due to thrombosis of unspecified vertebral artery: Secondary | ICD-10-CM | POA: Diagnosis not present

## 2017-05-05 DIAGNOSIS — I359 Nonrheumatic aortic valve disorder, unspecified: Secondary | ICD-10-CM

## 2017-05-05 DIAGNOSIS — I10 Essential (primary) hypertension: Secondary | ICD-10-CM

## 2017-05-05 DIAGNOSIS — I059 Rheumatic mitral valve disease, unspecified: Secondary | ICD-10-CM | POA: Diagnosis not present

## 2017-05-05 DIAGNOSIS — E782 Mixed hyperlipidemia: Secondary | ICD-10-CM | POA: Diagnosis not present

## 2017-05-05 NOTE — Progress Notes (Signed)
Cardiology Office Note   Date:  05/05/2017   ID:  Brandon [name], DOB March 27, 1946, MRN [id number]  PCP:  Maurice Small, MD  Cardiologist:   Dorris Carnes, MD   F/U of HTN, valvular dz     History of Present Illness: Brandon [name] is a 71 y.o. male with a history of CVA, HTN, HL, AS, MR, [name]   I saw him in September 2017    Since I saw him he says his breathing is stable  Denies CP COmes in with family member today  Say that he is not that active  He says that he does stationary bike a few times per week   Pt denies PND  No edema  No palpitations    Pt having signif discomfort in L shoulder  Being evaluated for rotator cuff surgery     Current Meds  Medication Sig  . acetaminophen (TYLENOL) 500 MG tablet Take 1,000-2,000 mg by mouth 2 (two) times daily as needed for moderate pain or headache.  [name] aspirin EC 81 MG tablet Take 81 mg by mouth daily.    [name] aspirin-sod bicarb-citric acid (ALKA-SELTZER) 325 MG TBEF tablet Take 650 mg by mouth daily as needed (indigestion).  [name] buPROPion (WELLBUTRIN SR) 150 MG 12 hr tablet Take 150 mg by mouth 2 (two) times daily.   . calcium carbonate (TUMS - DOSED IN MG ELEMENTAL CALCIUM) 500 MG chewable tablet Chew 2 tablets by mouth daily as needed for indigestion or heartburn.  . citalopram (CELEXA) 20 MG tablet Take 20 mg by mouth daily before breakfast.   . EPIPEN 2-PAK 0.3 MG/0.3ML SOAJ injection Inject 0.3 mg into the muscle as needed (allergic reaction). As directed  . lisinopril (PRINIVIL,ZESTRIL) 20 MG tablet Take 20 mg by mouth at bedtime.   . lovastatin (MEVACOR) 20 MG tablet Take 1 tablet (20 mg total) by mouth at bedtime.  . Multiple Vitamin (MULTIVITAMIN WITH MINERALS) TABS tablet Take 1 tablet by mouth daily.  [name] omeprazole (PRILOSEC) 20 MG capsule Take 20 mg by mouth at bedtime.   . Pseudoeph-Doxylamine-DM-APAP (NYQUIL PO) Take 1 Dose by mouth at bedtime as needed (cough).  . traMADol (ULTRAM) 50 MG tablet Take 50-100 mg by  mouth every 6 (six) hours as needed for moderate pain.     Allergies:   Bee venom   Past Medical History:  Diagnosis Date  . Anxiety   . Aortic insufficiency   . Arthritis    back   . Carotid stenosis    Right carotid stent (widely patent) 40 - 59% left plaque 11/13  . Depression   . Dyslipidemia   . GERD (gastroesophageal reflux disease)   . Heart murmur   . Hemiplegia affecting unspecified side, late effect of cerebrovascular disease    resolved- from L side   . Hypertension   . Jaundice    resolved following ERCP & Cholecystectomy  . Mitral valve insufficiency and aortic valve insufficiency   . Sleep concern    resulted in surgery- after + sleep test. Pt. doesn't hacve a problem any longer.   . Stroke ([place]) 03/11/2003   stent placed on the 31, 3, 2004, L side     Past Surgical History:  Procedure Laterality Date  . BACK SURGERY     lumbar back  . CHOLECYSTECTOMY    . ERCP N/A 05/31/2013   Procedure: ENDOSCOPIC RETROGRADE CHOLANGIOPANCREATOGRAPHY (ERCP);  Surgeon: Ladene Artist, MD;  Location: [facility];  Service: Endoscopy;  Laterality: N/A;  . FOOT SURGERY     right  . LAPAROSCOPIC CHOLECYSTECTOMY SINGLE PORT N/A 06/01/2013   Procedure: LAPAROSCOPIC CHOLECYSTECTOMY SINGLE PORT;  Surgeon: Adin Hector, MD;  Location: [facility];  Service: General;  Laterality: N/A;  . POLYPECTOMY    . TONSILLECTOMY       Social History:  The patient  reports that he has been smoking Cigarettes.  He has been smoking about 0.25 packs per day. He has never used smokeless tobacco. He reports that he drinks about 1.2 - 1.8 oz of alcohol per week . He reports that he does not use drugs.   Family History:  The patient's family history includes Angina in his mother; Stroke in his father.    ROS:  Please see the history of present illness. All other systems are reviewed and  Negative to the above problem except as noted.    PHYSICAL EXAM: VS:  BP 132/64   Pulse 78   Ht 6' (1.829  m)   Wt 252 lb (114.3 kg)   SpO2 96%   BMI 34.18 kg/m   GEN: Obese 71 yo  in no acute distress  HEENT: normal  Neck: no JVD, carotid bruits, or masses Cardiac: RRR; no murmurs, rubs, or gallops,no edema  Respiratory:  clear to auscultation bilaterally, normal work of breathing GI: soft, nontender, nondistended, + BS  No hepatomegaly  MS: no deformity Moving all extremities   Skin: warm and dry, no rash Neuro:  Strength and sensation are intact Psych: euthymic mood, full affect   EKG:  EKG is ordered today.   Lipid Panel    Component Value Date/Time   CHOL 120 12/27/2012 0912   TRIG 115 12/27/2012 0912   HDL 40 12/27/2012 0912   LDLCALC 57 12/27/2012 0912      Wt Readings from Last 3 Encounters:  05/05/17 252 lb (114.3 kg)  04/21/17 251 lb 1.6 oz (113.9 kg)  09/02/16 249 lb 6.4 oz (113.1 kg)      ASSESSMENT AND PLAN:  1  Aortic valve dz  AS remains moderate  AI mild/moderate  2  MV dz.  MR mod to severe .  Compared to echo from last fall LV is signif larger 65 mm LVEF normal I have reviewed with pt  I would recomm TEE to eval valve more closely  3  Neuro  Hx CVA with cartoid stenting  Mod plaquing on USN in Sept 2017  4.  HL Need to get lipids from primary MD   5  HTN  BP is adequately controlled        Current medicines are reviewed at length with the patient today.  The patient does not have concerns regarding medicines.  Signed, Dorris Carnes, MD  05/05/2017 10:36 AM    [address], [place], [place]  [address] Phone: [phone]; Fax: [phone]

## 2017-05-05 NOTE — Patient Instructions (Signed)
Your physician recommends that you continue on your current medications as directed. Please refer to the Current Medication list given to you today.  Your physician has requested that you have a TEE. During a TEE, sound waves are used to create images of your heart. It provides your doctor with information about the size and shape of your heart and how well your heart's chambers and valves are working. In this test, a transducer is attached to the end of a flexible tube that's guided down your throat and into your esophagus (the tube leading from you mouth to your stomach) to get a more detailed image of your heart. You are not awake for the procedure. Please see the instruction sheet given to you today. For further information please visit [email or web address].    

## 2017-05-09 ENCOUNTER — Ambulatory Visit (HOSPITAL_COMMUNITY)
Admission: RE | Admit: 2017-05-09 | Discharge: 2017-05-09 | Disposition: A | Discharge status: Home / Self Care | Payer: Medicare HMO | Source: Ambulatory Visit | Attending: Cardiovascular Disease | Admitting: Cardiovascular Disease

## 2017-05-09 ENCOUNTER — Encounter (HOSPITAL_COMMUNITY): Payer: Self-pay | Admitting: Cardiovascular Disease

## 2017-05-09 ENCOUNTER — Encounter (HOSPITAL_COMMUNITY)
Admission: RE | Disposition: A | Discharge status: Home / Self Care | Payer: Self-pay | Source: Ambulatory Visit | Attending: Cardiovascular Disease

## 2017-05-09 ENCOUNTER — Ambulatory Visit (HOSPITAL_BASED_OUTPATIENT_CLINIC_OR_DEPARTMENT_OTHER)
Admission: RE | Admit: 2017-05-09 | Discharge: 2017-05-09 | Disposition: A | Discharge status: Home / Self Care | Payer: Medicare HMO | Source: Ambulatory Visit | Attending: Internal Medicine | Admitting: Internal Medicine

## 2017-05-09 DIAGNOSIS — K219 Gastro-esophageal reflux disease without esophagitis: Secondary | ICD-10-CM | POA: Insufficient documentation

## 2017-05-09 DIAGNOSIS — F419 Anxiety disorder, unspecified: Secondary | ICD-10-CM | POA: Insufficient documentation

## 2017-05-09 DIAGNOSIS — I6521 Occlusion and stenosis of right carotid artery: Secondary | ICD-10-CM | POA: Insufficient documentation

## 2017-05-09 DIAGNOSIS — Z7982 Long term (current) use of aspirin: Secondary | ICD-10-CM | POA: Insufficient documentation

## 2017-05-09 DIAGNOSIS — E785 Hyperlipidemia, unspecified: Secondary | ICD-10-CM | POA: Diagnosis not present

## 2017-05-09 DIAGNOSIS — I350 Nonrheumatic aortic (valve) stenosis: Secondary | ICD-10-CM | POA: Diagnosis not present

## 2017-05-09 DIAGNOSIS — F329 Major depressive disorder, single episode, unspecified: Secondary | ICD-10-CM | POA: Insufficient documentation

## 2017-05-09 DIAGNOSIS — I080 Rheumatic disorders of both mitral and aortic valves: Secondary | ICD-10-CM | POA: Diagnosis not present

## 2017-05-09 DIAGNOSIS — I10 Essential (primary) hypertension: Secondary | ICD-10-CM | POA: Insufficient documentation

## 2017-05-09 DIAGNOSIS — I340 Nonrheumatic mitral (valve) insufficiency: Secondary | ICD-10-CM

## 2017-05-09 DIAGNOSIS — M1990 Unspecified osteoarthritis, unspecified site: Secondary | ICD-10-CM | POA: Diagnosis not present

## 2017-05-09 DIAGNOSIS — I69359 Hemiplegia and hemiparesis following cerebral infarction affecting unspecified side: Secondary | ICD-10-CM | POA: Insufficient documentation

## 2017-05-09 DIAGNOSIS — F17210 Nicotine dependence, cigarettes, uncomplicated: Secondary | ICD-10-CM | POA: Diagnosis not present

## 2017-05-09 HISTORY — PX: TEE WITHOUT CARDIOVERSION: SHX5443

## 2017-05-09 SURGERY — ECHOCARDIOGRAM, TRANSESOPHAGEAL
Anesthesia: Moderate Sedation

## 2017-05-09 MED ORDER — MIDAZOLAM HCL 5 MG/ML IJ SOLN
INTRAMUSCULAR | Status: AC
  Filled 2017-05-09: qty 2

## 2017-05-09 MED ORDER — BUTAMBEN-TETRACAINE-BENZOCAINE 2-2-14 % EX AERO
INHALATION_SPRAY | CUTANEOUS | Status: DC | PRN
  Administered 2017-05-09: 2 via TOPICAL

## 2017-05-09 MED ORDER — SODIUM CHLORIDE 0.9 % IV SOLN: INTRAVENOUS | Status: DC

## 2017-05-09 MED ORDER — FENTANYL CITRATE (PF) 100 MCG/2ML IJ SOLN
INTRAMUSCULAR | Status: DC | PRN
Start: 2017-05-09 — End: 2017-05-09
  Administered 2017-05-09: 12.5 ug via INTRAVENOUS
  Administered 2017-05-09 (×2): 25 ug via INTRAVENOUS

## 2017-05-09 MED ORDER — FENTANYL CITRATE (PF) 100 MCG/2ML IJ SOLN
INTRAMUSCULAR | Status: AC
  Filled 2017-05-09: qty 2

## 2017-05-09 MED ORDER — MIDAZOLAM HCL 10 MG/2ML IJ SOLN
INTRAMUSCULAR | Status: DC | PRN
  Administered 2017-05-09: 1 mg via INTRAVENOUS
  Administered 2017-05-09 (×2): 2 mg via INTRAVENOUS

## 2017-05-09 NOTE — Discharge Instructions (Signed)

## 2017-05-09 NOTE — Interval H&P Note (Signed)
History and Physical Interval Note:  05/09/2017 10:19 AM  Brandon [name]  has presented today for surgery, with the diagnosis of abnormal echo  The various methods of treatment have been discussed with the patient and family. After consideration of risks, benefits and other options for treatment, the patient has consented to  Procedure(s): TRANSESOPHAGEAL ECHOCARDIOGRAM (TEE) (N/A) as a surgical intervention .  The patient's history has been reviewed, patient examined, no change in status, stable for surgery.  I have reviewed the patient's chart and labs.  Questions were answered to the patient's satisfaction.     Skeet Latch, MD

## 2017-05-09 NOTE — CV Procedure (Signed)
Brief TEE Note  LVEF 55-60% Moderate MR, moderate AR Moderate AS Mild TR No LAA/LA thrombus   During this procedure the patient is administered a total of Versed 5 mg and Fentanyl 62.5 mcg to achieve and maintain moderate conscious sedation.  The patient's heart rate, blood pressure, and oxygen saturation are monitored continuously during the procedure. The period of conscious sedation is 25 minutes, of which I was present face-to-face 100% of this time.   For additional details see full report.  Cecilie Heidel C. Oval Linsey, MD, [facility] 05/09/2017 10:48 AM

## 2017-05-09 NOTE — H&P (View-Only) (Signed)
Cardiology Office Note   Date:  05/05/2017   ID:  JIREH ELMORE, DOB [birth year], MRN [id number]  PCP:  Maurice Small, MD  Cardiologist:   Dorris Carnes, MD   F/U of HTN, valvular dz     History of Present Illness: Brandon [name] is a 71 y.o. male with a history of CVA, HTN, HL, AS, MR, [name]   I saw him in September 2017    Since I saw him he says his breathing is stable  Denies CP COmes in with family member today  Say that he is not that active  He says that he does stationary bike a few times per week   Pt denies PND  No edema  No palpitations    Pt having signif discomfort in L shoulder  Being evaluated for rotator cuff surgery     Current Meds  Medication Sig  . acetaminophen (TYLENOL) 500 MG tablet Take 1,000-2,000 mg by mouth 2 (two) times daily as needed for moderate pain or headache.  [name] aspirin EC 81 MG tablet Take 81 mg by mouth daily.    [name] aspirin-sod bicarb-citric acid (ALKA-SELTZER) 325 MG TBEF tablet Take 650 mg by mouth daily as needed (indigestion).  [name] buPROPion (WELLBUTRIN SR) 150 MG 12 hr tablet Take 150 mg by mouth 2 (two) times daily.   . calcium carbonate (TUMS - DOSED IN MG ELEMENTAL CALCIUM) 500 MG chewable tablet Chew 2 tablets by mouth daily as needed for indigestion or heartburn.  . citalopram (CELEXA) 20 MG tablet Take 20 mg by mouth daily before breakfast.   . EPIPEN 2-PAK 0.3 MG/0.3ML SOAJ injection Inject 0.3 mg into the muscle as needed (allergic reaction). As directed  . lisinopril (PRINIVIL,ZESTRIL) 20 MG tablet Take 20 mg by mouth at bedtime.   . lovastatin (MEVACOR) 20 MG tablet Take 1 tablet (20 mg total) by mouth at bedtime.  . Multiple Vitamin (MULTIVITAMIN WITH MINERALS) TABS tablet Take 1 tablet by mouth daily.  [name] omeprazole (PRILOSEC) 20 MG capsule Take 20 mg by mouth at bedtime.   . Pseudoeph-Doxylamine-DM-APAP (NYQUIL PO) Take 1 Dose by mouth at bedtime as needed (cough).  . traMADol (ULTRAM) 50 MG tablet Take 50-100 mg by  mouth every 6 (six) hours as needed for moderate pain.     Allergies:   Bee venom   Past Medical History:  Diagnosis Date  . Anxiety   . Aortic insufficiency   . Arthritis    back   . Carotid stenosis    Right carotid stent (widely patent) 40 - 59% left plaque 11/13  . Depression   . Dyslipidemia   . GERD (gastroesophageal reflux disease)   . Heart murmur   . Hemiplegia affecting unspecified side, late effect of cerebrovascular disease    resolved- from L side   . Hypertension   . Jaundice    resolved following ERCP & Cholecystectomy  . Mitral valve insufficiency and aortic valve insufficiency   . Sleep concern    resulted in surgery- after + sleep test. Pt. doesn't hacve a problem any longer.   . Stroke ([place]) 03/11/2003   stent placed on the 31, 3, 2004, L side     Past Surgical History:  Procedure Laterality Date  . BACK SURGERY     lumbar back  . CHOLECYSTECTOMY    . ERCP N/A 05/31/2013   Procedure: ENDOSCOPIC RETROGRADE CHOLANGIOPANCREATOGRAPHY (ERCP);  Surgeon: Ladene Artist, MD;  Location: [facility];  Service: Endoscopy;  Laterality: N/A;  . FOOT SURGERY     right  . LAPAROSCOPIC CHOLECYSTECTOMY SINGLE PORT N/A 06/01/2013   Procedure: LAPAROSCOPIC CHOLECYSTECTOMY SINGLE PORT;  Surgeon: Adin Hector, MD;  Location: [facility];  Service: General;  Laterality: N/A;  . POLYPECTOMY    . TONSILLECTOMY       Social History:  The patient  reports that he has been smoking Cigarettes.  He has been smoking about 0.25 packs per day. He has never used smokeless tobacco. He reports that he drinks about 1.2 - 1.8 oz of alcohol per week . He reports that he does not use drugs.   Family History:  The patient's family history includes Angina in his mother; Stroke in his father.    ROS:  Please see the history of present illness. All other systems are reviewed and  Negative to the above problem except as noted.    PHYSICAL EXAM: VS:  BP 132/64   Pulse 78   Ht 6' (1.829  m)   Wt 252 lb (114.3 kg)   SpO2 96%   BMI 34.18 kg/m   GEN: Obese 71 yo  in no acute distress  HEENT: normal  Neck: no JVD, carotid bruits, or masses Cardiac: RRR; no murmurs, rubs, or gallops,no edema  Respiratory:  clear to auscultation bilaterally, normal work of breathing GI: soft, nontender, nondistended, + BS  No hepatomegaly  MS: no deformity Moving all extremities   Skin: warm and dry, no rash Neuro:  Strength and sensation are intact Psych: euthymic mood, full affect   EKG:  EKG is ordered today.   Lipid Panel    Component Value Date/Time   CHOL 120 12/27/2012 0912   TRIG 115 12/27/2012 0912   HDL 40 12/27/2012 0912   LDLCALC 57 12/27/2012 0912      Wt Readings from Last 3 Encounters:  05/05/17 252 lb (114.3 kg)  04/21/17 251 lb 1.6 oz (113.9 kg)  09/02/16 249 lb 6.4 oz (113.1 kg)      ASSESSMENT AND PLAN:  1  Aortic valve dz  AS remains moderate  AI mild/moderate  2  MV dz.  MR mod to severe .  Compared to echo from last fall LV is signif larger 65 mm LVEF normal I have reviewed with pt  I would recomm TEE to eval valve more closely  3  Neuro  Hx CVA with cartoid stenting  Mod plaquing on USN in Sept 2017  4.  HL Need to get lipids from primary MD   5  HTN  BP is adequately controlled        Current medicines are reviewed at length with the patient today.  The patient does not have concerns regarding medicines.  Signed, Dorris Carnes, MD  05/05/2017 10:36 AM    [address], [place],   [address] Phone: [phone]; Fax: [phone]

## 2017-05-10 ENCOUNTER — Other Ambulatory Visit: Payer: Self-pay | Admitting: Orthopedic Surgery

## 2017-05-10 ENCOUNTER — Encounter (HOSPITAL_COMMUNITY): Payer: Self-pay | Admitting: Cardiovascular Disease

## 2017-05-17 ENCOUNTER — Encounter (HOSPITAL_COMMUNITY): Payer: Self-pay | Admitting: *Deleted

## 2017-05-17 DIAGNOSIS — Z7982 Long term (current) use of aspirin: Secondary | ICD-10-CM | POA: Diagnosis not present

## 2017-05-17 DIAGNOSIS — Z8249 Family history of ischemic heart disease and other diseases of the circulatory system: Secondary | ICD-10-CM | POA: Diagnosis not present

## 2017-05-17 DIAGNOSIS — Z91030 Bee allergy status: Secondary | ICD-10-CM | POA: Diagnosis not present

## 2017-05-17 DIAGNOSIS — I10 Essential (primary) hypertension: Secondary | ICD-10-CM | POA: Diagnosis not present

## 2017-05-17 DIAGNOSIS — G4730 Sleep apnea, unspecified: Secondary | ICD-10-CM | POA: Diagnosis not present

## 2017-05-17 DIAGNOSIS — R7303 Prediabetes: Secondary | ICD-10-CM | POA: Diagnosis not present

## 2017-05-17 DIAGNOSIS — Z9049 Acquired absence of other specified parts of digestive tract: Secondary | ICD-10-CM | POA: Diagnosis not present

## 2017-05-17 DIAGNOSIS — M75122 Complete rotator cuff tear or rupture of left shoulder, not specified as traumatic: Secondary | ICD-10-CM | POA: Diagnosis not present

## 2017-05-17 DIAGNOSIS — M75102 Unspecified rotator cuff tear or rupture of left shoulder, not specified as traumatic: Secondary | ICD-10-CM | POA: Diagnosis present

## 2017-05-17 DIAGNOSIS — E785 Hyperlipidemia, unspecified: Secondary | ICD-10-CM | POA: Diagnosis not present

## 2017-05-17 DIAGNOSIS — Z6834 Body mass index (BMI) 34.0-34.9, adult: Secondary | ICD-10-CM | POA: Diagnosis not present

## 2017-05-17 DIAGNOSIS — K219 Gastro-esophageal reflux disease without esophagitis: Secondary | ICD-10-CM | POA: Diagnosis not present

## 2017-05-17 DIAGNOSIS — I6523 Occlusion and stenosis of bilateral carotid arteries: Secondary | ICD-10-CM | POA: Diagnosis not present

## 2017-05-17 DIAGNOSIS — I739 Peripheral vascular disease, unspecified: Secondary | ICD-10-CM | POA: Diagnosis not present

## 2017-05-17 DIAGNOSIS — I69354 Hemiplegia and hemiparesis following cerebral infarction affecting left non-dominant side: Secondary | ICD-10-CM | POA: Diagnosis not present

## 2017-05-17 DIAGNOSIS — F419 Anxiety disorder, unspecified: Secondary | ICD-10-CM | POA: Diagnosis not present

## 2017-05-17 DIAGNOSIS — Z79899 Other long term (current) drug therapy: Secondary | ICD-10-CM | POA: Diagnosis not present

## 2017-05-17 DIAGNOSIS — F329 Major depressive disorder, single episode, unspecified: Secondary | ICD-10-CM | POA: Diagnosis not present

## 2017-05-17 DIAGNOSIS — I080 Rheumatic disorders of both mitral and aortic valves: Secondary | ICD-10-CM | POA: Diagnosis not present

## 2017-05-17 DIAGNOSIS — Z823 Family history of stroke: Secondary | ICD-10-CM | POA: Diagnosis not present

## 2017-05-17 DIAGNOSIS — M4690 Unspecified inflammatory spondylopathy, site unspecified: Secondary | ICD-10-CM | POA: Diagnosis not present

## 2017-05-17 DIAGNOSIS — R011 Cardiac murmur, unspecified: Secondary | ICD-10-CM | POA: Diagnosis not present

## 2017-05-17 DIAGNOSIS — F17210 Nicotine dependence, cigarettes, uncomplicated: Secondary | ICD-10-CM | POA: Diagnosis not present

## 2017-05-17 MED ORDER — CEFAZOLIN SODIUM-DEXTROSE 2-4 GM/100ML-% IV SOLN
2.0000 g | INTRAVENOUS | Status: AC
  Administered 2017-05-18: 2 g via INTRAVENOUS
  Filled 2017-05-17: qty 100

## 2017-05-17 NOTE — Progress Notes (Signed)
Anesthesia Chart Review:  Patient is a same day work up.   Pt is a 71 year old male scheduled for L shoulder arthroscopy with rotator cuff repair and subacromial decompression on 05/18/2017 with Tania Ade, M.D.  Surgery was originally scheduled for 04/27/17 but was postponed so cardiology could evaluate status of mitral and aortic valvular stenosis. Recent echo and TEE results below.   - PCP is Maurice Small, MD - Cardiologist is Dorris Carnes, M.D. who has cleared patient for surgery.  PMH includes: stroke, carotid stenosis (s/p R ICA stent 2004), HTN, aortic stenosis, moderate to severe mitral regurgitation, dyslipidemia, GERD. Current smoker. BMI 34. S/P cholecystectomy 06/01/13.  Medications include: ASA 81 mg, lisinopril, lovastatin, Prilosec  Labs will be obtained DOS.   EKG 09/02/16: NSR. Nonspecific ST abnormality.  TEE 05/09/17:  Left ventricle: Systolic function was normal. The estimated ejection fraction was in the range of 55% to 60%. - Aortic valve: There was moderate stenosis. There was moderate regurgitation. Peak velocity (S): 413.19 cm/s. Mean gradient (S): 33 mm Hg. Regurgitation pressure half-time: 257 ms. - Mitral valve: Mobility of the posterior leaflet was moderately restricted. There was moderate regurgitation directed posteriorly. - Left atrium: No evidence of thrombus in the atrial cavity or appendage. - Right ventricle: The cavity size was normal. Wall thickness was normal. Systolic function was normal. - Right atrium: No evidence of thrombus in the atrial cavity or appendage. - Atrial septum: No defect or patent foramen ovale was identified by color flow Doppler. - Tricuspid valve: No evidence of vegetation. There was no regurgitation. - Pulmonic valve: No evidence of vegetation. - Impressions: Mitral regurgitation is posteriorly directed and mild by PISA calculation. However, given the eccentricity of the jet, it is likely moderate.  Echo 04/27/17:  - Left  ventricle: The cavity size was moderately dilated. Wall thickness was normal. Systolic function was normal. The estimated ejection fraction was in the range of 55% to 60%. - Aortic valve: STable gradients since September 2017. There was moderate stenosis. There was mild to moderate regurgitation. Mean gradient (S): 20 mm Hg. Peak gradient (S): 36 mm Hg. Valve area (VTI): 1.23 cm^2. Valve area (Vmax): 1.13 cm^2. Valve area (Vmean): 1.08 cm^2. - Mitral valve: Calcified annulus. There was moderate to severe regurgitation. - Left atrium: The atrium was moderately dilated. - Atrial septum: No defect or patent foramen ovale was identified. - Pulmonary arteries: PA peak pressure: 32 mm Hg (S  Carotid duplex 09/05/16: - Heterogeneous plaque, bilaterally. - Stable 1-39% ICA stenosis, bilaterally. - Stable > 50% ECA stenosis, bilaterally. - Normal subclavian arteries, bilaterally. - Patent vertebral arteries with antegrade low.  If labs acceptable DOS, I anticipate pt can proceed with surgery as scheduled.   Willeen Cass, FNP-BC [facility]/Anesthesiology Phone: [phone] 05/17/2017 12:36 PM

## 2017-05-17 NOTE — Progress Notes (Signed)
Pt SDW-Pre-op call completed by pt spouse, Katharine Look (DPR). Spouse denies that pt C/O SOB and chest pain. Pt under the care of Dr. Harrington Challenger, Cardiology. Spouse stated that pt has not had a cardiac cath. Spouse stated that pt stopped taking  Aspirin, vitamins and herbal medications. Do not take any NSAIDs ie: Ibuprofen, Advil, Naproxen, BC and Goody Powder. Spouse also made aware to have pt stop taking Nyquil DM, and Alka Seltzer. Spouse verbalized understanding of all pre-op instructions.

## 2017-05-18 ENCOUNTER — Encounter (HOSPITAL_COMMUNITY): Payer: Self-pay | Admitting: *Deleted

## 2017-05-18 ENCOUNTER — Encounter (HOSPITAL_COMMUNITY)
Admission: RE | Disposition: A | Discharge status: Home / Self Care | Payer: Self-pay | Source: Ambulatory Visit | Attending: Orthopedic Surgery

## 2017-05-18 ENCOUNTER — Ambulatory Visit (HOSPITAL_COMMUNITY): Payer: Medicare HMO | Admitting: Emergency Medicine

## 2017-05-18 ENCOUNTER — Observation Stay (HOSPITAL_COMMUNITY)
Admission: RE | Admit: 2017-05-18 | Discharge: 2017-05-19 | Disposition: A | Discharge status: Home / Self Care | Payer: Medicare HMO | Source: Ambulatory Visit | Attending: Orthopedic Surgery | Admitting: Orthopedic Surgery

## 2017-05-18 DIAGNOSIS — F329 Major depressive disorder, single episode, unspecified: Secondary | ICD-10-CM | POA: Diagnosis not present

## 2017-05-18 DIAGNOSIS — M75102 Unspecified rotator cuff tear or rupture of left shoulder, not specified as traumatic: Secondary | ICD-10-CM | POA: Diagnosis not present

## 2017-05-18 DIAGNOSIS — Z6834 Body mass index (BMI) 34.0-34.9, adult: Secondary | ICD-10-CM | POA: Insufficient documentation

## 2017-05-18 DIAGNOSIS — Z823 Family history of stroke: Secondary | ICD-10-CM | POA: Insufficient documentation

## 2017-05-18 DIAGNOSIS — F17210 Nicotine dependence, cigarettes, uncomplicated: Secondary | ICD-10-CM | POA: Insufficient documentation

## 2017-05-18 DIAGNOSIS — M75122 Complete rotator cuff tear or rupture of left shoulder, not specified as traumatic: Principal | ICD-10-CM | POA: Insufficient documentation

## 2017-05-18 DIAGNOSIS — Z79899 Other long term (current) drug therapy: Secondary | ICD-10-CM | POA: Insufficient documentation

## 2017-05-18 DIAGNOSIS — I739 Peripheral vascular disease, unspecified: Secondary | ICD-10-CM | POA: Insufficient documentation

## 2017-05-18 DIAGNOSIS — I6523 Occlusion and stenosis of bilateral carotid arteries: Secondary | ICD-10-CM | POA: Insufficient documentation

## 2017-05-18 DIAGNOSIS — M4690 Unspecified inflammatory spondylopathy, site unspecified: Secondary | ICD-10-CM | POA: Insufficient documentation

## 2017-05-18 DIAGNOSIS — I69354 Hemiplegia and hemiparesis following cerebral infarction affecting left non-dominant side: Secondary | ICD-10-CM | POA: Insufficient documentation

## 2017-05-18 DIAGNOSIS — R011 Cardiac murmur, unspecified: Secondary | ICD-10-CM | POA: Insufficient documentation

## 2017-05-18 DIAGNOSIS — M7542 Impingement syndrome of left shoulder: Secondary | ICD-10-CM | POA: Diagnosis not present

## 2017-05-18 DIAGNOSIS — Z9049 Acquired absence of other specified parts of digestive tract: Secondary | ICD-10-CM | POA: Insufficient documentation

## 2017-05-18 DIAGNOSIS — I080 Rheumatic disorders of both mitral and aortic valves: Secondary | ICD-10-CM | POA: Diagnosis not present

## 2017-05-18 DIAGNOSIS — Z8249 Family history of ischemic heart disease and other diseases of the circulatory system: Secondary | ICD-10-CM | POA: Insufficient documentation

## 2017-05-18 DIAGNOSIS — Z7982 Long term (current) use of aspirin: Secondary | ICD-10-CM | POA: Insufficient documentation

## 2017-05-18 DIAGNOSIS — F419 Anxiety disorder, unspecified: Secondary | ICD-10-CM | POA: Diagnosis not present

## 2017-05-18 DIAGNOSIS — G8918 Other acute postprocedural pain: Secondary | ICD-10-CM | POA: Diagnosis not present

## 2017-05-18 DIAGNOSIS — R7303 Prediabetes: Secondary | ICD-10-CM | POA: Insufficient documentation

## 2017-05-18 DIAGNOSIS — I10 Essential (primary) hypertension: Secondary | ICD-10-CM | POA: Diagnosis not present

## 2017-05-18 DIAGNOSIS — Z9889 Other specified postprocedural states: Secondary | ICD-10-CM

## 2017-05-18 DIAGNOSIS — K219 Gastro-esophageal reflux disease without esophagitis: Secondary | ICD-10-CM | POA: Diagnosis not present

## 2017-05-18 DIAGNOSIS — E785 Hyperlipidemia, unspecified: Secondary | ICD-10-CM | POA: Diagnosis not present

## 2017-05-18 DIAGNOSIS — M67912 Unspecified disorder of synovium and tendon, left shoulder: Secondary | ICD-10-CM | POA: Diagnosis not present

## 2017-05-18 DIAGNOSIS — Z91030 Bee allergy status: Secondary | ICD-10-CM | POA: Insufficient documentation

## 2017-05-18 DIAGNOSIS — G4730 Sleep apnea, unspecified: Secondary | ICD-10-CM | POA: Insufficient documentation

## 2017-05-18 DIAGNOSIS — I2510 Atherosclerotic heart disease of native coronary artery without angina pectoris: Secondary | ICD-10-CM | POA: Diagnosis not present

## 2017-05-18 DIAGNOSIS — S43432A Superior glenoid labrum lesion of left shoulder, initial encounter: Secondary | ICD-10-CM | POA: Diagnosis not present

## 2017-05-18 HISTORY — PX: SHOULDER ARTHROSCOPY WITH ROTATOR CUFF REPAIR AND SUBACROMIAL DECOMPRESSION: SHX5686

## 2017-05-18 HISTORY — DX: Presence of dental prosthetic device (complete) (partial): Z97.2

## 2017-05-18 HISTORY — DX: Sleep apnea, unspecified: G47.30

## 2017-05-18 HISTORY — DX: Presence of spectacles and contact lenses: Z97.3

## 2017-05-18 HISTORY — DX: Prediabetes: R73.03

## 2017-05-18 HISTORY — DX: Presence of external hearing-aid: Z97.4

## 2017-05-18 LAB — COMPREHENSIVE METABOLIC PANEL
ALBUMIN: 3.4 g/dL — AB (ref 3.5–5.0)
ALT: 13 U/L — AB (ref 17–63)
AST: 17 U/L (ref 15–41)
Alkaline Phosphatase: 31 U/L — ABNORMAL LOW (ref 38–126)
Anion gap: 7 (ref 5–15)
BUN: 22 mg/dL — AB (ref 6–20)
CO2: 23 mmol/L (ref 22–32)
CREATININE: 1.13 mg/dL (ref 0.61–1.24)
Calcium: 9.1 mg/dL (ref 8.9–10.3)
Chloride: 109 mmol/L (ref 101–111)
GFR calc Af Amer: >60 mL/min (ref >60)
GLUCOSE: 115 mg/dL — AB (ref 65–99)
POTASSIUM: 4.4 mmol/L (ref 3.5–5.1)
Sodium: 139 mmol/L (ref 135–145)
Total Bilirubin: 0.5 mg/dL (ref 0.3–1.2)
Total Protein: 6.3 g/dL — ABNORMAL LOW (ref 6.5–8.1)

## 2017-05-18 LAB — CBC
HEMATOCRIT: 33.8 % — AB (ref 39.0–52.0)
Hemoglobin: 11 g/dL — ABNORMAL LOW (ref 13.0–17.0)
MCH: 32 pg (ref 26.0–34.0)
MCHC: 32.5 g/dL (ref 30.0–36.0)
MCV: 98.3 fL (ref 78.0–100.0)
PLATELETS: 294 10*3/uL (ref 150–400)
RBC: 3.44 MIL/uL — ABNORMAL LOW (ref 4.22–5.81)
RDW: 13.4 % (ref 11.5–15.5)
WBC: 7.1 10*3/uL (ref 4.0–10.5)

## 2017-05-18 LAB — GLUCOSE, CAPILLARY: Glucose-Capillary: 115 mg/dL — ABNORMAL HIGH (ref 65–99)

## 2017-05-18 SURGERY — SHOULDER ARTHROSCOPY WITH ROTATOR CUFF REPAIR AND SUBACROMIAL DECOMPRESSION
Anesthesia: General | Laterality: Left

## 2017-05-18 MED ORDER — ACETAMINOPHEN 325 MG PO TABS
650.0000 mg | ORAL_TABLET | Freq: Four times a day (QID) | ORAL | Status: DC | PRN
  Administered 2017-05-18: 650 mg via ORAL
  Filled 2017-05-18: qty 2

## 2017-05-18 MED ORDER — MIDAZOLAM HCL 2 MG/2ML IJ SOLN
1.0000 mg | Freq: Once | INTRAMUSCULAR | Status: AC
  Administered 2017-05-18: 1 mg via INTRAVENOUS
  Filled 2017-05-18: qty 1

## 2017-05-18 MED ORDER — LIDOCAINE 2% (20 MG/ML) 5 ML SYRINGE
INTRAMUSCULAR | Status: AC
  Filled 2017-05-18: qty 5

## 2017-05-18 MED ORDER — PROPOFOL 10 MG/ML IV BOLUS
INTRAVENOUS | Status: AC
Start: 2017-05-18 — End: 2017-05-18
  Filled 2017-05-18: qty 20

## 2017-05-18 MED ORDER — ROCURONIUM BROMIDE 10 MG/ML (PF) SYRINGE
PREFILLED_SYRINGE | INTRAVENOUS | Status: AC
  Filled 2017-05-18: qty 10

## 2017-05-18 MED ORDER — BUPIVACAINE-EPINEPHRINE (PF) 0.5% -1:200000 IJ SOLN
INTRAMUSCULAR | Status: DC | PRN
  Administered 2017-05-18: 30 mL via PERINEURAL

## 2017-05-18 MED ORDER — FENTANYL CITRATE (PF) 250 MCG/5ML IJ SOLN
INTRAMUSCULAR | Status: AC
  Filled 2017-05-18: qty 5

## 2017-05-18 MED ORDER — ALUMINUM HYDROXIDE GEL 320 MG/5ML PO SUSP
15.0000 mL | ORAL | Status: DC | PRN
  Filled 2017-05-18: qty 30

## 2017-05-18 MED ORDER — PHENYLEPHRINE 40 MCG/ML (10ML) SYRINGE FOR IV PUSH (FOR BLOOD PRESSURE SUPPORT)
PREFILLED_SYRINGE | INTRAVENOUS | Status: AC
  Filled 2017-05-18: qty 10

## 2017-05-18 MED ORDER — LACTATED RINGERS IV SOLN
INTRAVENOUS | Status: DC
  Administered 2017-05-18 (×2): via INTRAVENOUS

## 2017-05-18 MED ORDER — CITALOPRAM HYDROBROMIDE 20 MG PO TABS: 20.0000 mg | ORAL_TABLET | Freq: Every day | ORAL | Status: DC

## 2017-05-18 MED ORDER — PHENYLEPHRINE HCL 10 MG/ML IJ SOLN
INTRAVENOUS | Status: DC | PRN
  Administered 2017-05-18: 25 ug/min via INTRAVENOUS

## 2017-05-18 MED ORDER — LISINOPRIL 20 MG PO TABS
20.0000 mg | ORAL_TABLET | Freq: Every day | ORAL | Status: DC
  Administered 2017-05-18: 20 mg via ORAL
  Filled 2017-05-18: qty 1

## 2017-05-18 MED ORDER — PROPOFOL 10 MG/ML IV BOLUS: INTRAVENOUS | Status: DC | PRN

## 2017-05-18 MED ORDER — ASPIRIN EC 325 MG PO TBEC: 325.0000 mg | DELAYED_RELEASE_TABLET | Freq: Every day | ORAL | Status: DC

## 2017-05-18 MED ORDER — SUGAMMADEX SODIUM 500 MG/5ML IV SOLN
INTRAVENOUS | Status: DC | PRN
  Administered 2017-05-18: 230 mg via INTRAVENOUS

## 2017-05-18 MED ORDER — FENTANYL CITRATE (PF) 100 MCG/2ML IJ SOLN
50.0000 ug | Freq: Once | INTRAMUSCULAR | Status: AC
  Administered 2017-05-18: 50 ug via INTRAVENOUS
  Filled 2017-05-18: qty 1

## 2017-05-18 MED ORDER — PHENOL 1.4 % MT LIQD: 1.0000 | OROMUCOSAL | Status: DC | PRN

## 2017-05-18 MED ORDER — EPHEDRINE 5 MG/ML INJ
INTRAVENOUS | Status: AC
  Filled 2017-05-18: qty 10

## 2017-05-18 MED ORDER — CEFAZOLIN SODIUM-DEXTROSE 2-4 GM/100ML-% IV SOLN
2.0000 g | Freq: Four times a day (QID) | INTRAVENOUS | Status: DC
  Administered 2017-05-18 – 2017-05-19 (×2): 2 g via INTRAVENOUS
  Filled 2017-05-18 (×3): qty 100

## 2017-05-18 MED ORDER — ROCURONIUM BROMIDE 100 MG/10ML IV SOLN
INTRAVENOUS | Status: DC | PRN
  Administered 2017-05-18: 60 mg via INTRAVENOUS

## 2017-05-18 MED ORDER — ONDANSETRON HCL 4 MG/2ML IJ SOLN
4.0000 mg | Freq: Four times a day (QID) | INTRAMUSCULAR | Status: DC | PRN
  Administered 2017-05-18: 4 mg via INTRAVENOUS
  Filled 2017-05-18: qty 2

## 2017-05-18 MED ORDER — MORPHINE SULFATE (PF) 4 MG/ML IV SOLN: 1.0000 mg | INTRAVENOUS | Status: DC | PRN

## 2017-05-18 MED ORDER — OXYCODONE HCL 5 MG/5ML PO SOLN: 5.0000 mg | Freq: Once | ORAL | Status: DC | PRN

## 2017-05-18 MED ORDER — DOCUSATE SODIUM 100 MG PO CAPS
100.0000 mg | ORAL_CAPSULE | Freq: Two times a day (BID) | ORAL | Status: DC
  Administered 2017-05-18: 100 mg via ORAL
  Filled 2017-05-18: qty 1

## 2017-05-18 MED ORDER — PANTOPRAZOLE SODIUM 40 MG PO TBEC
40.0000 mg | DELAYED_RELEASE_TABLET | Freq: Every day | ORAL | Status: DC
  Administered 2017-05-18: 40 mg via ORAL
  Filled 2017-05-18: qty 1

## 2017-05-18 MED ORDER — ROCURONIUM BROMIDE 10 MG/ML (PF) SYRINGE
PREFILLED_SYRINGE | INTRAVENOUS | Status: AC
  Filled 2017-05-18: qty 5

## 2017-05-18 MED ORDER — PROPOFOL 10 MG/ML IV BOLUS
INTRAVENOUS | Status: AC
  Filled 2017-05-18: qty 20

## 2017-05-18 MED ORDER — METOCLOPRAMIDE HCL 5 MG PO TABS: 5.0000 mg | ORAL_TABLET | Freq: Three times a day (TID) | ORAL | Status: DC | PRN

## 2017-05-18 MED ORDER — POVIDONE-IODINE 7.5 % EX SOLN: Freq: Once | CUTANEOUS | Status: DC

## 2017-05-18 MED ORDER — BUPROPION HCL ER (SR) 150 MG PO TB12
150.0000 mg | ORAL_TABLET | Freq: Two times a day (BID) | ORAL | Status: DC
  Administered 2017-05-18: 150 mg via ORAL
  Filled 2017-05-18: qty 1

## 2017-05-18 MED ORDER — FENTANYL CITRATE (PF) 100 MCG/2ML IJ SOLN
INTRAMUSCULAR | Status: AC
  Administered 2017-05-18: 50 ug via INTRAVENOUS
  Filled 2017-05-18: qty 2

## 2017-05-18 MED ORDER — FENTANYL CITRATE (PF) 100 MCG/2ML IJ SOLN: 25.0000 ug | INTRAMUSCULAR | Status: DC | PRN

## 2017-05-18 MED ORDER — POLYETHYLENE GLYCOL 3350 17 G PO PACK: 17.0000 g | PACK | Freq: Every day | ORAL | Status: DC | PRN

## 2017-05-18 MED ORDER — PROPOFOL 10 MG/ML IV BOLUS
INTRAVENOUS | Status: DC | PRN
  Administered 2017-05-18: 160 mg via INTRAVENOUS
  Administered 2017-05-18: 20 mg via INTRAVENOUS

## 2017-05-18 MED ORDER — ONDANSETRON HCL 4 MG PO TABS
4.0000 mg | ORAL_TABLET | Freq: Four times a day (QID) | ORAL | Status: DC | PRN
  Administered 2017-05-19: 4 mg via ORAL
  Filled 2017-05-18: qty 1

## 2017-05-18 MED ORDER — OXYCODONE HCL 5 MG PO TABS
5.0000 mg | ORAL_TABLET | ORAL | Status: DC | PRN
  Administered 2017-05-18: 5 mg via ORAL
  Administered 2017-05-19 (×2): 10 mg via ORAL
  Filled 2017-05-18: qty 2
  Filled 2017-05-18: qty 1
  Filled 2017-05-18: qty 2

## 2017-05-18 MED ORDER — ONDANSETRON HCL 4 MG/2ML IJ SOLN
INTRAMUSCULAR | Status: DC | PRN
  Administered 2017-05-18: 4 mg via INTRAVENOUS

## 2017-05-18 MED ORDER — FENTANYL CITRATE (PF) 100 MCG/2ML IJ SOLN
INTRAMUSCULAR | Status: DC | PRN
  Administered 2017-05-18: 150 ug via INTRAVENOUS

## 2017-05-18 MED ORDER — METOCLOPRAMIDE HCL 5 MG/ML IJ SOLN
5.0000 mg | Freq: Three times a day (TID) | INTRAMUSCULAR | Status: DC | PRN
  Filled 2017-05-18: qty 2

## 2017-05-18 MED ORDER — MIDAZOLAM HCL 2 MG/2ML IJ SOLN
INTRAMUSCULAR | Status: AC
  Filled 2017-05-18: qty 2

## 2017-05-18 MED ORDER — OXYCODONE HCL 5 MG PO TABS: 5.0000 mg | ORAL_TABLET | Freq: Once | ORAL | Status: DC | PRN

## 2017-05-18 MED ORDER — MENTHOL 3 MG MT LOZG: 1.0000 | LOZENGE | OROMUCOSAL | Status: DC | PRN

## 2017-05-18 MED ORDER — DIPHENHYDRAMINE HCL 12.5 MG/5ML PO ELIX: 12.5000 mg | ORAL_SOLUTION | ORAL | Status: DC | PRN

## 2017-05-18 MED ORDER — SODIUM CHLORIDE 0.9 % IV SOLN
INTRAVENOUS | Status: DC
  Administered 2017-05-18 – 2017-05-19 (×2): via INTRAVENOUS

## 2017-05-18 MED ORDER — BISACODYL 10 MG RE SUPP: 10.0000 mg | Freq: Every day | RECTAL | Status: DC | PRN

## 2017-05-18 MED ORDER — ZOLPIDEM TARTRATE 5 MG PO TABS: 5.0000 mg | ORAL_TABLET | Freq: Every evening | ORAL | Status: DC | PRN

## 2017-05-18 MED ORDER — ACETAMINOPHEN 650 MG RE SUPP: 650.0000 mg | Freq: Four times a day (QID) | RECTAL | Status: DC | PRN

## 2017-05-18 MED ORDER — PRAVASTATIN SODIUM 20 MG PO TABS
20.0000 mg | ORAL_TABLET | Freq: Every day | ORAL | Status: DC
  Administered 2017-05-18: 20 mg via ORAL
  Filled 2017-05-18: qty 1

## 2017-05-18 MED ORDER — MIDAZOLAM HCL 2 MG/2ML IJ SOLN
INTRAMUSCULAR | Status: AC
  Administered 2017-05-18: 1 mg via INTRAVENOUS
  Filled 2017-05-18: qty 2

## 2017-05-18 MED ORDER — EPHEDRINE SULFATE 50 MG/ML IJ SOLN
INTRAMUSCULAR | Status: DC | PRN
  Administered 2017-05-18 (×3): 5 mg via INTRAVENOUS

## 2017-05-18 SURGICAL SUPPLY — 59 items
ANCHOR PEEK 4.75X19.1 SWLK C (Anchor) ×6 IMPLANT
BLADE SURG 11 STRL SS (BLADE) ×3 IMPLANT
BUR OVAL 4.0 (BURR) ×3 IMPLANT
CANNULA 5.75X71 LONG (CANNULA) IMPLANT
CANNULA TWIST IN 8.25X7CM (CANNULA) ×3 IMPLANT
CHLORAPREP W/TINT 26ML (MISCELLANEOUS) ×6 IMPLANT
DRAPE INCISE IOBAN 66X45 STRL (DRAPES) ×3 IMPLANT
DRAPE ORTHO SPLIT 77X108 STRL (DRAPES) ×4
DRAPE STERI 35X30 U-POUCH (DRAPES) ×3 IMPLANT
DRAPE SURG 17X23 STRL (DRAPES) ×3 IMPLANT
DRAPE SURG ORHT 6 SPLT 77X108 (DRAPES) ×2 IMPLANT
DRAPE U-SHAPE 47X51 STRL (DRAPES) ×3 IMPLANT
DRAPE U-SHAPE 76X120 STRL (DRAPES) IMPLANT
DRSG PAD ABDOMINAL 8X10 ST (GAUZE/BANDAGES/DRESSINGS) ×9 IMPLANT
GAUZE SPONGE 4X4 12PLY STRL (GAUZE/BANDAGES/DRESSINGS) ×3 IMPLANT
GAUZE XEROFORM 1X8 LF (GAUZE/BANDAGES/DRESSINGS) ×3 IMPLANT
GLOVE BIO SURGEON STRL SZ7 (GLOVE) ×6 IMPLANT
GLOVE BIO SURGEON STRL SZ7.5 (GLOVE) ×6 IMPLANT
GLOVE BIOGEL PI IND STRL 7.0 (GLOVE) ×3 IMPLANT
GLOVE BIOGEL PI IND STRL 8 (GLOVE) ×2 IMPLANT
GLOVE BIOGEL PI INDICATOR 7.0 (GLOVE) ×6
GLOVE BIOGEL PI INDICATOR 8 (GLOVE) ×4
GOWN STRL REUS W/ TWL LRG LVL3 (GOWN DISPOSABLE) ×3 IMPLANT
GOWN STRL REUS W/ TWL XL LVL3 (GOWN DISPOSABLE) ×1 IMPLANT
GOWN STRL REUS W/TWL LRG LVL3 (GOWN DISPOSABLE) ×6
GOWN STRL REUS W/TWL XL LVL3 (GOWN DISPOSABLE) ×2
KIT BASIN OR (CUSTOM PROCEDURE TRAY) ×3 IMPLANT
KIT ROOM TURNOVER OR (KITS) ×3 IMPLANT
MANIFOLD NEPTUNE II (INSTRUMENTS) ×3 IMPLANT
NDL SUT 6 .5 CRC .975X.05 MAYO (NEEDLE) IMPLANT
NEEDLE HYPO 25GX1X1/2 BEV (NEEDLE) IMPLANT
NEEDLE MAYO TAPER (NEEDLE)
NEEDLE SCORPION MULTI FIRE (NEEDLE) ×3 IMPLANT
NEEDLE SPNL 18GX3.5 QUINCKE PK (NEEDLE) ×3 IMPLANT
PACK SHOULDER (CUSTOM PROCEDURE TRAY) ×3 IMPLANT
PAD ARMBOARD 7.5X6 YLW CONV (MISCELLANEOUS) ×6 IMPLANT
PROBE BIPOLAR ATHRO 135MM 90D (MISCELLANEOUS) ×3 IMPLANT
RESECTOR FULL RADIUS 4.2MM (BLADE) ×3 IMPLANT
SLING ARM FOAM STRAP LRG (SOFTGOODS) ×3 IMPLANT
SLING ARM FOAM STRAP MED (SOFTGOODS) IMPLANT
SLING ARM IMMOBILIZER XL (CAST SUPPLIES) ×3 IMPLANT
SPONGE LAP 4X18 X RAY DECT (DISPOSABLE) IMPLANT
SUPPORT WRAP ARM LG (MISCELLANEOUS) IMPLANT
SUT 2 FIBERLOOP 20 STRT BLUE (SUTURE)
SUT ETHILON 3 0 PS 1 (SUTURE) ×6 IMPLANT
SUT TIGER TAPE 7 IN WHITE (SUTURE) IMPLANT
SUTURE 2 FIBERLOOP 20 STRT BLU (SUTURE) IMPLANT
SUTURE TAPE 1.3 40 TPR END (SUTURE) ×1 IMPLANT
SUTURETAPE 1.3 40 TPR END (SUTURE) ×3
SYR CONTROL 10ML LL (SYRINGE) ×3 IMPLANT
TAPE FIBER 2MM 7IN #2 BLUE (SUTURE) IMPLANT
TOWEL OR 17X24 6PK STRL BLUE (TOWEL DISPOSABLE) ×3 IMPLANT
TOWEL OR 17X26 10 PK STRL BLUE (TOWEL DISPOSABLE) ×3 IMPLANT
TOWEL OR NON WOVEN STRL DISP B (DISPOSABLE) ×3 IMPLANT
TUBE CONNECTING 12'X1/4 (SUCTIONS) ×1
TUBE CONNECTING 12X1/4 (SUCTIONS) ×2 IMPLANT
TUBING ARTHROSCOPY IRRIG 16FT (MISCELLANEOUS) ×3 IMPLANT
WAND HAND CNTRL MULTIVAC 90 (MISCELLANEOUS) ×3 IMPLANT
WATER STERILE IRR 1000ML POUR (IV SOLUTION) ×3 IMPLANT

## 2017-05-18 NOTE — Anesthesia Preprocedure Evaluation (Addendum)
Anesthesia Evaluation  Patient identified by MRN, date of birth, ID band Patient awake    Reviewed: Allergy & Precautions, H&P , NPO status , Patient's Chart, lab work & pertinent test results  History of Anesthesia Complications Negative for: history of anesthetic complications  Airway Mallampati: II  TM Distance: >3 FB Neck ROM: Full    Dental  (+) Teeth Intact, Poor Dentition, Partial Lower   Pulmonary sleep apnea , COPD, Current Smoker, former smoker,    breath sounds clear to auscultation       Cardiovascular hypertension, Pt. on medications + Peripheral Vascular Disease  + Valvular Problems/Murmurs MR, AS and AI  Rhythm:Regular Rate:Normal  EF 55%, mild MR, mod AI/AS mean 64mmHg   Neuro/Psych PSYCHIATRIC DISORDERS Anxiety Depression CVA, No Residual Symptoms    GI/Hepatic Neg liver ROS, GERD  Medicated and Controlled,  Endo/Other  Morbid obesity  Renal/GU negative Renal ROS  negative genitourinary   Musculoskeletal  (+) Arthritis ,   Abdominal (+) + obese,   Peds negative pediatric ROS (+)  Hematology  (+) anemia ,   Anesthesia Other Findings   Reproductive/Obstetrics                            Anesthesia Physical Anesthesia Plan  ASA: III  Anesthesia Plan: General and Regional   Post-op Pain Management:  Regional for Post-op pain   Induction: Intravenous  PONV Risk Score and Plan: 1 and Ondansetron and Treatment may vary due to age  Airway Management Planned: Oral ETT  Additional Equipment: None  Intra-op Plan:   Post-operative Plan: Extubation in OR  Informed Consent: I have reviewed the patients History and Physical, chart, labs and discussed the procedure including the risks, benefits and alternatives for the proposed anesthesia with the patient or authorized representative who has indicated his/her understanding and acceptance.   Dental advisory given  Plan  Discussed with: CRNA and Surgeon  Anesthesia Plan Comments:         Anesthesia Quick Evaluation

## 2017-05-18 NOTE — H&P (Signed)
Brandon [name] is an 71 y.o. male.   Chief Complaint: Left shoulder pain and dysfunction HPI: The patient has a small full-thickness left shoulder rotator cuff tear and has failed conservative measures. Pain keeps him awake at night and interferes with his quality of life.  Past Medical History:  Diagnosis Date  . Anxiety   . Aortic insufficiency   . Arthritis    back   . Carotid stenosis    Right carotid stent (widely patent) 40 - 59% left plaque 11/13  . Depression   . Dyslipidemia   . GERD (gastroesophageal reflux disease)   . Heart murmur   . Hemiplegia affecting unspecified side, late effect of cerebrovascular disease    resolved- from L side   . Hypertension   . Jaundice    resolved following ERCP & Cholecystectomy  . Mitral valve insufficiency and aortic valve insufficiency   . Pre-diabetes    per spouse  . Sleep apnea    does not wear CPAP  . Sleep concern    resulted in surgery- after + sleep test. Pt. doesn't hacve a problem any longer.   . Stroke ([place]) 03/11/2003   stent placed on the 31, 3, 2004, L side   . Wears glasses   . Wears hearing aid in both ears   . Wears partial dentures     Past Surgical History:  Procedure Laterality Date  . BACK SURGERY     lumbar back  . CHOLECYSTECTOMY    . ERCP N/A 05/31/2013   Procedure: ENDOSCOPIC RETROGRADE CHOLANGIOPANCREATOGRAPHY (ERCP);  Surgeon: Ladene Artist, MD;  Location: [facility];  Service: Endoscopy;  Laterality: N/A;  . FOOT SURGERY     right  . LAPAROSCOPIC CHOLECYSTECTOMY SINGLE PORT N/A 06/01/2013   Procedure: LAPAROSCOPIC CHOLECYSTECTOMY SINGLE PORT;  Surgeon: Adin Hector, MD;  Location: [facility];  Service: General;  Laterality: N/A;  . POLYPECTOMY    . TEE WITHOUT CARDIOVERSION N/A 05/09/2017   Procedure: TRANSESOPHAGEAL ECHOCARDIOGRAM (TEE);  Surgeon: Skeet Latch, MD;  Location: [facility];  Service: Cardiovascular;  Laterality: N/A;  . TONSILLECTOMY      Family History  Problem  Relation Age of Onset  . Stroke Father        No details  . Angina Mother    Social History:  reports that he has been smoking Cigarettes.  He has been smoking about 0.25 packs per day. He has never used smokeless tobacco. He reports that he drinks about 1.2 - 1.8 oz of alcohol per week . He reports that he does not use drugs.  Allergies:  Allergies  Allergen Reactions  . Bee Venom Anaphylaxis and Swelling    Medications Prior to Admission  Medication Sig Dispense Refill  . acetaminophen (TYLENOL) 500 MG tablet Take 1,000-2,000 mg by mouth 2 (two) times daily as needed for moderate pain or headache.    [name] aspirin EC 81 MG tablet Take 81 mg by mouth daily.      [name] aspirin-sod bicarb-citric acid (ALKA-SELTZER) 325 MG TBEF tablet Take 650 mg by mouth daily as needed (indigestion).    [name] buPROPion (WELLBUTRIN SR) 150 MG 12 hr tablet Take 150 mg by mouth 2 (two) times daily.     . calcium carbonate (TUMS - DOSED IN MG ELEMENTAL CALCIUM) 500 MG chewable tablet Chew 2 tablets by mouth daily as needed for indigestion or heartburn.    . citalopram (CELEXA) 20 MG tablet Take 20 mg by mouth daily before breakfast.     .  EPIPEN 2-PAK 0.3 MG/0.3ML SOAJ injection Inject 0.3 mg into the muscle as needed (allergic reaction). As directed    . lisinopril (PRINIVIL,ZESTRIL) 20 MG tablet Take 20 mg by mouth at bedtime.     . lovastatin (MEVACOR) 20 MG tablet Take 1 tablet (20 mg total) by mouth at bedtime. 90 tablet 2  . Multiple Vitamin (MULTIVITAMIN WITH MINERALS) TABS tablet Take 1 tablet by mouth daily.    [name] omeprazole (PRILOSEC) 20 MG capsule Take 20 mg by mouth at bedtime.     . Pseudoeph-Doxylamine-DM-APAP (NYQUIL PO) Take 1 Dose by mouth at bedtime as needed (cough).    . traMADol (ULTRAM) 50 MG tablet Take 50-100 mg by mouth every 6 (six) hours as needed for moderate pain.      Results for orders placed or performed during the hospital encounter of 05/18/17 (from the past 48 hour(s))  Glucose,  capillary     Status: Abnormal   Collection Time: 05/18/17 11:19 AM  Result Value Ref Range   Glucose-Capillary 115 (H) 65 - 99 mg/dL   No results found.  Review of Systems  All other systems reviewed and are negative.   Blood pressure (!) 148/65, pulse 80, temperature 97.9 F (36.6 C), temperature source Oral, resp. rate 13, height 6' (1.829 m), weight 114.3 kg (252 lb), SpO2 96 %. Physical Exam  Constitutional: He is oriented to person, place, and time. He appears well-developed and well-nourished.  HENT:  Head: Atraumatic.  Eyes: EOM are normal.  Cardiovascular: Intact distal pulses.   Respiratory: Effort normal.  Musculoskeletal:  L shoulder pain and weakness with RC testing. NVID  Neurological: He is alert and oriented to person, place, and time.  Skin: Skin is warm and dry.  Psychiatric: He has a normal mood and affect.     Assessment/Plan The patient has a small full-thickness left shoulder rotator cuff tear and has failed conservative measures. Pain keeps him awake at night and interferes with his quality of life. Plan L arthroscopic RCR Risks / benefits of surgery discussed Consent on chart  NPO for OR Preop antibiotics   Nita Sells, MD 05/18/2017, 12:23 PM

## 2017-05-18 NOTE — Anesthesia Procedure Notes (Signed)
Anesthesia Regional Block: Interscalene brachial plexus block   Pre-Anesthetic Checklist: ,, timeout performed, Correct Patient, Correct Site, Correct Laterality, Correct Procedure, Correct Position, site marked, Risks and benefits discussed,  Surgical consent,  Pre-op evaluation,  At surgeon's request and post-op pain management  Laterality: Upper and Left  Prep: chloraprep       Needles:  Injection technique: Single-shot  Needle Type: Echogenic Stimulator Needle          Additional Needles:   Procedures: ultrasound guided,,,,,,,,  Narrative:  Start time: 05/18/2017 12:08 PM End time: 05/18/2017 12:19 PM Injection made incrementally with aspirations every 5 mL.  Performed by: Personally  Anesthesiologist: Ryla Cauthon  Additional Notes: H+P and labs reviewed, risks and benefits discussed with patient, procedure tolerated well without complications

## 2017-05-18 NOTE — Anesthesia Postprocedure Evaluation (Signed)
Anesthesia Post Note  Patient: Brandon [name]  Procedure(s) Performed: Procedure(s) (LRB): SHOULDER ARTHROSCOPY WITH ROTATOR CUFF REPAIR AND SUBACROMIAL DECOMPRESSION (Left)     Patient location during evaluation: PACU Anesthesia Type: General and Regional Level of consciousness: awake, oriented and sedated Pain management: pain level controlled Vital Signs Assessment: post-procedure vital signs reviewed and stable Respiratory status: spontaneous breathing, nonlabored ventilation, respiratory function stable and patient connected to nasal cannula oxygen Cardiovascular status: blood pressure returned to baseline and stable Postop Assessment: no signs of nausea or vomiting Anesthetic complications: no    Last Vitals:  Vitals:   05/18/17 1553 05/18/17 1608  BP: (!) 109/51 103/60  Pulse: 72 70  Resp: 15 15  Temp:      Last Pain:  Vitals:   05/18/17 1608  TempSrc:   PainSc: Asleep                 [name],JAMES TERRILL

## 2017-05-18 NOTE — Anesthesia Procedure Notes (Signed)
Procedure Name: Intubation Date/Time: 05/18/2017 1:33 PM Performed by: Shirlyn Goltz Pre-anesthesia Checklist: Patient identified, Emergency Drugs available, Suction available and Patient being monitored Patient Re-evaluated:Patient Re-evaluated prior to inductionOxygen Delivery Method: Circle system utilized Preoxygenation: Pre-oxygenation with 100% oxygen Intubation Type: IV induction Ventilation: Mask ventilation without difficulty Laryngoscope Size: Mac and 3 Grade View: Grade II Tube type: Oral Tube size: 7.5 mm Number of attempts: 1 Airway Equipment and Method: Stylet Placement Confirmation: ETT inserted through vocal cords under direct vision,  positive ETCO2 and breath sounds checked- equal and bilateral Secured at: 21 cm Tube secured with: Tape Dental Injury: Teeth and Oropharynx as per pre-operative assessment

## 2017-05-18 NOTE — Transfer of Care (Signed)
Immediate Anesthesia Transfer of Care Note  Patient: Cobey R Liberatore  Procedure(s) Performed: Procedure(s) with comments: SHOULDER ARTHROSCOPY WITH ROTATOR CUFF REPAIR AND SUBACROMIAL DECOMPRESSION (Left) - LEFT SHOULDER ARTHROSCOPY WITH ROTATOR CUFF REPAIR AND SUBACROMIAL DECOMPRESSION  Patient Location: PACU  Anesthesia Type:GA combined with regional for post-op pain  Level of Consciousness: awake and alert   Airway & Oxygen Therapy: Patient Spontanous Breathing and Patient connected to nasal cannula oxygen  Post-op Assessment: Report given to RN and Post -op Vital signs reviewed and stable  Post vital signs: Reviewed and stable  Last Vitals:  Vitals:   05/18/17 1217 05/18/17 1220  BP: (!) 148/65 (!) 148/65  Pulse: 77 80  Resp: 18 13  Temp:      Last Pain:  Vitals:   05/18/17 1220  TempSrc:   PainSc: 0-No pain         Complications: No apparent anesthesia complications

## 2017-05-18 NOTE — Discharge Instructions (Signed)

## 2017-05-18 NOTE — Op Note (Signed)
Procedure(s): SHOULDER ARTHROSCOPY WITH ROTATOR CUFF REPAIR AND SUBACROMIAL DECOMPRESSION Procedure Note  JEDIDIAH DEMARTINI male 71 y.o. 05/18/2017  Procedure(s) and Anesthesia Type: L SHOULDER ARTHROSCOPY WITH ROTATOR CUFF REPAIR L SHOULDER ARTHROSCOPIC SUBACROMIAL DECOMPRESSION L SHOULDER ARTHROSCOPIC BICEPS TENOTOMY DEBRIDEMENT SLAP TEAR  Surgeon(s) and Role:    Tania Ade, MD - Primary     Surgeon: Nita Sells   Assistants: Jeanmarie Hubert PA-C (Danielle was present and scrubbed throughout the procedure and was essential in positioning, assisting with the camera and instrumentation,, and closure)  Anesthesia: General endotracheal anesthesia with preoperative interscalene block given by the attending anesthesiologist    Procedure Detail  SHOULDER ARTHROSCOPY WITH ROTATOR CUFF REPAIR AND SUBACROMIAL DECOMPRESSION  Estimated Blood Loss: Min         Drains: none  Blood Given: none         Specimens: none        Complications:  * No complications entered in OR log *         Disposition: PACU - hemodynamically stable.         Condition: stable    Procedure:   INDICATIONS FOR SURGERY: The patient is 71 y.o. male who has a history of left shoulder rotator cuff tear which is failed conservative measures. Indicated for surgery to try and decrease pain and restore function.  OPERATIVE FINDINGS: Examination under anesthesia: No stiffness or instability.   DESCRIPTION OF PROCEDURE: The patient was identified in preoperative  holding area where I personally marked the operative site after  verifying site, side, and procedure with the patient. An interscalene block was given by the attending anesthesiologist the holding area.  The patient was taken back to the operating room where general anesthesia was induced without complication and was placed in the beach-chair position with the back  elevated about 60 degrees and all extremities and head and neck  carefully padded and  positioned.   The left upper extremity was then prepped and  draped in a standard sterile fashion. The appropriate time-out  procedure was carried out. The patient did receive IV antibiotics  within 30 minutes of incision.   A small posterior portal incision was made and the arthroscope was introduced into the joint. An anterior portal was then established above the subscapularis using needle localization. Small cannula was placed anteriorly. Diagnostic arthroscopy was then carried out.  Subscapularis was noted to be intact. The superior labrum was completely torn from the superior glenoid and hanging into the socket. The biceps tendon was pulled in the joint noted to have severe degeneration involving more than 50% of the thickness of the tendon. The superior labrum was extensively debrided with a shaver and the ArthroCare was then used to release the biceps from the superior labrum. This attached site was extensively debrided with shaver. Glenoid humeral joint surfaces were intact without significant chondromalacia. Infraspinatus was intact. The supraspinatus had a small non-retracted tear which was debrided from the undersurface.  The arthroscope was then introduced into the subacromial space a standard lateral portal was established with needle localization. The shaver was used through the lateral portal to perform extensive bursectomy. Coracoacromial ligament was examined and found to be frayed indicating chronic impingement.  The bursal side of the rotator cuff tear was identified. A lateral portal was established. The tear edge was debrided and the tuberosity was burred down to bleeding bone to promote healing. A posterior lateral viewing portal was established in the anterior portal was moved into the subacromial space.  The repair is an carried out by placing a 4.75 peak swivel lock anchor percutaneously just off the articular margin the centered in the tear preloaded with  2 suture tapes. These 4 suture strands were then passed evenly throughout the tear and brought over to an additional 4.75 peak swivel lock anchor in a lateral row bringing the tendon nicely down over the prepared tuberosity. The repair was felt to be complete and watertight.  The coracoacromial ligament was taken down off the anterior acromion with the ArthroCare exposing a moderate hooked anterior acromial spur. A high-speed bur was then used through the lateral portal to take down the anterior acromial spur from lateral to medial in a standard acromioplasty.  The acromioplasty was also viewed from the lateral portal and the bur was used as necessary to ensure that the acromion was completely flat from posterior to anterior.  The arthroscopic equipment was removed from the joint and the portals were closed with 3-0 nylon in an interrupted fashion. Sterile dressings were then applied including Xeroform 4 x 4's ABDs and tape. The patient was then allowed to awaken from general anesthesia, placed in a sling, transferred to the stretcher and taken to the recovery room in stable condition.   POSTOPERATIVE PLAN: The patient will be observed overnight given his untreated sleep apnea and will followup in one week for suture removal and wound check.  He will follow the standard cuff protocol.

## 2017-05-19 ENCOUNTER — Encounter (HOSPITAL_COMMUNITY): Payer: Self-pay | Admitting: Orthopedic Surgery

## 2017-05-19 DIAGNOSIS — I080 Rheumatic disorders of both mitral and aortic valves: Secondary | ICD-10-CM | POA: Diagnosis not present

## 2017-05-19 DIAGNOSIS — M4690 Unspecified inflammatory spondylopathy, site unspecified: Secondary | ICD-10-CM | POA: Diagnosis not present

## 2017-05-19 DIAGNOSIS — M75122 Complete rotator cuff tear or rupture of left shoulder, not specified as traumatic: Secondary | ICD-10-CM | POA: Diagnosis not present

## 2017-05-19 DIAGNOSIS — F329 Major depressive disorder, single episode, unspecified: Secondary | ICD-10-CM | POA: Diagnosis not present

## 2017-05-19 DIAGNOSIS — K219 Gastro-esophageal reflux disease without esophagitis: Secondary | ICD-10-CM | POA: Diagnosis not present

## 2017-05-19 DIAGNOSIS — I6523 Occlusion and stenosis of bilateral carotid arteries: Secondary | ICD-10-CM | POA: Diagnosis not present

## 2017-05-19 DIAGNOSIS — F419 Anxiety disorder, unspecified: Secondary | ICD-10-CM | POA: Diagnosis not present

## 2017-05-19 DIAGNOSIS — E785 Hyperlipidemia, unspecified: Secondary | ICD-10-CM | POA: Diagnosis not present

## 2017-05-19 DIAGNOSIS — R011 Cardiac murmur, unspecified: Secondary | ICD-10-CM | POA: Diagnosis not present

## 2017-05-19 MED ORDER — OXYCODONE-ACETAMINOPHEN 5-325 MG PO TABS: 1.0000 | ORAL_TABLET | ORAL | 0 refills | Status: DC | PRN

## 2017-05-19 MED ORDER — DOCUSATE SODIUM 100 MG PO CAPS: 100.0000 mg | ORAL_CAPSULE | Freq: Three times a day (TID) | ORAL | 0 refills | Status: AC | PRN

## 2017-05-19 NOTE — Progress Notes (Signed)
   PATIENT ID: Brandon [name]   1 Day Post-Op Procedure(s) (LRB): SHOULDER ARTHROSCOPY WITH ROTATOR CUFF REPAIR AND SUBACROMIAL DECOMPRESSION (Left)  Subjective: Doing well, pain controlled. Had some sweating overnight but improved and feels better this am.   Objective:  Vitals:   05/19/17 0221 05/19/17 0503  BP: (!) 141/65 (!) 157/58  Pulse:  91  Resp: 16 16  Temp:  98.2 F (36.8 C)     L UE dressing c/d/i Wiggles fingers, distally NVI  Labs:   Recent Labs  05/18/17 1208  HGB 11.0*   Recent Labs  05/18/17 1208  WBC 7.1  RBC 3.44*  HCT 33.8*  PLT 294   Recent Labs  05/18/17 1208  NA 139  K 4.4  CL 109  CO2 23  BUN 22*  CREATININE 1.13  GLUCOSE 115*  CALCIUM 9.1    Assessment and Plan: 1 day s/p left RCR repair D/c home today Scripts in chart Fu with Dr. Tamera Punt in 1 week  VTE proph: [email or web address]

## 2017-05-19 NOTE — Discharge Summary (Signed)
Patient ID: Brandon [name] MRN: [id number] DOB/AGE: 03-14-1946 71 y.o.  Admit date: 05/18/2017 Discharge date: 05/19/2017  Admission Diagnoses:  Active Problems:   S/P left rotator cuff repair   Discharge Diagnoses:  Same  Past Medical History:  Diagnosis Date  . Anxiety   . Aortic insufficiency   . Arthritis    back   . Carotid stenosis    Right carotid stent (widely patent) 40 - 59% left plaque 11/13  . Depression   . Dyslipidemia   . GERD (gastroesophageal reflux disease)   . Heart murmur   . Hemiplegia affecting unspecified side, late effect of cerebrovascular disease    resolved- from L side   . Hypertension   . Jaundice    resolved following ERCP & Cholecystectomy  . Mitral valve insufficiency and aortic valve insufficiency   . Pre-diabetes    per spouse  . Sleep apnea    does not wear CPAP  . Sleep concern    resulted in surgery- after + sleep test. Pt. doesn't hacve a problem any longer.   . Stroke ([place]) 03/11/2003   stent placed on the 31, 3, 2004, L side   . Wears glasses   . Wears hearing aid in both ears   . Wears partial dentures     Surgeries: Procedure(s): SHOULDER ARTHROSCOPY WITH ROTATOR CUFF REPAIR AND SUBACROMIAL DECOMPRESSION on 05/18/2017   Consultants:   Discharged Condition: Improved  Hospital Course: Brandon [name] is an 71 y.o. male who was admitted 05/18/2017 for operative treatment of left rotator cuff repair. Patient has severe unremitting pain that affects sleep, daily activities, and work/hobbies. After pre-op clearance the patient was taken to the operating room on 05/18/2017 and underwent  Procedure(s): SHOULDER ARTHROSCOPY WITH ROTATOR CUFF REPAIR AND SUBACROMIAL DECOMPRESSION.    Patient was given perioperative antibiotics: Anti-infectives    Start     Dose/Rate Route Frequency Ordered Stop   05/18/17 2000  ceFAZolin (ANCEF) IVPB 2g/100 mL premix     2 g 200 mL/hr over 30 Minutes Intravenous Every 6 hours 05/18/17 1642 05/19/17  1359   05/18/17 1300  ceFAZolin (ANCEF) IVPB 2g/100 mL premix     2 g 200 mL/hr over 30 Minutes Intravenous To ShortStay Surgical 05/17/17 1038 05/18/17 1342       Patient was given sequential compression devices, early ambulation, and asa to prevent DVT.  Patient benefited maximally from hospital stay and there were no complications.    Recent vital signs: Patient Vitals for the past 24 hrs:  BP Temp Temp src Pulse Resp SpO2 Height Weight  05/19/17 0503 (!) 157/58 98.2 F (36.8 C) Oral 91 16 99 % - -  05/19/17 0221 (!) 141/65 - - - 16 - - -  05/19/17 0018 (!) 179/77 97.5 F (36.4 C) Oral (!) 112 20 95 % - -  05/18/17 2057 (!) 111/50 97.9 F (36.6 C) Oral 68 17 94 % - -  05/18/17 1645 (!) 106/49 97.7 F (36.5 C) - 76 18 98 % - -  05/18/17 1623 (!) 112/50 97.8 F (36.6 C) - 71 13 94 % - -  05/18/17 1608 103/60 - - 70 15 96 % - -  05/18/17 1553 (!) 109/51 - - 72 15 96 % - -  05/18/17 1538 125/62 - - 75 18 95 % - -  05/18/17 1523 (!) 130/57 - - 77 16 94 % - -  05/18/17 1508 (!) 157/73 97.7 F (36.5 C) - 87 14 94 % - -  05/18/17 1220 (!) 148/65 - - 80 13 96 % - -  05/18/17 1217 (!) 148/65 - - 77 18 97 % - -  05/18/17 1215 - - - 83 15 98 % - -  05/18/17 1210 - - - 74 13 99 % - -  05/18/17 1205 - - - 74 13 98 % - -  05/18/17 1126 (!) 142/62 97.9 F (36.6 C) Oral 77 18 96 % 6' (1.829 m) 114.3 kg (252 lb)     Recent laboratory studies:  Recent Labs  05/18/17 1208  WBC 7.1  HGB 11.0*  HCT 33.8*  PLT 294  NA 139  K 4.4  CL 109  CO2 23  BUN 22*  CREATININE 1.13  GLUCOSE 115*  CALCIUM 9.1     Discharge Medications:   Allergies as of 05/19/2017      Reactions   Bee Venom Anaphylaxis, Swelling      Medication List    STOP taking these medications   acetaminophen 500 MG tablet Commonly known as:  TYLENOL     TAKE these medications   aspirin EC 81 MG tablet Take 81 mg by mouth daily.   aspirin-sod bicarb-citric acid 325 MG Tbef tablet Commonly known as:   ALKA-SELTZER Take 650 mg by mouth daily as needed (indigestion).   buPROPion 150 MG 12 hr tablet Commonly known as:  WELLBUTRIN SR Take 150 mg by mouth 2 (two) times daily.   calcium carbonate 500 MG chewable tablet Commonly known as:  TUMS - dosed in mg elemental calcium Chew 2 tablets by mouth daily as needed for indigestion or heartburn.   citalopram 20 MG tablet Commonly known as:  CELEXA Take 20 mg by mouth daily before breakfast.   docusate sodium 100 MG capsule Commonly known as:  COLACE Take 1 capsule (100 mg total) by mouth 3 (three) times daily as needed.   EPIPEN 2-PAK 0.3 mg/0.3 mL Soaj injection Generic drug:  EPINEPHrine Inject 0.3 mg into the muscle as needed (allergic reaction). As directed   lisinopril 20 MG tablet Commonly known as:  PRINIVIL,ZESTRIL Take 20 mg by mouth at bedtime.   lovastatin 20 MG tablet Commonly known as:  MEVACOR Take 1 tablet (20 mg total) by mouth at bedtime.   multivitamin with minerals Tabs tablet Take 1 tablet by mouth daily.   NYQUIL PO Take 1 Dose by mouth at bedtime as needed (cough).   omeprazole 20 MG capsule Commonly known as:  PRILOSEC Take 20 mg by mouth at bedtime.   oxyCODONE-acetaminophen 5-325 MG tablet Commonly known as:  ROXICET Take 1-2 tablets by mouth every 4 (four) hours as needed for severe pain.   traMADol 50 MG tablet Commonly known as:  ULTRAM Take 50-100 mg by mouth every 6 (six) hours as needed for moderate pain.       Diagnostic Studies: No results found.  Disposition: 01-Home or Self Care  Discharge Instructions    Call MD / Call 911    Complete by:  As directed    If you experience chest pain or shortness of breath, CALL 911 and be transported to the hospital emergency room.  If you develope a fever above 101 F, pus (white drainage) or increased drainage or redness at the wound, or calf pain, call your surgeon's office.   Constipation Prevention    Complete by:  As directed    Drink  plenty of fluids.  Prune juice may be helpful.  You may use a stool softener,  such as Colace (over the counter) 100 mg twice a day.  Use MiraLax (over the counter) for constipation as needed.   Diet - low sodium heart healthy    Complete by:  As directed    Increase activity slowly as tolerated    Complete by:  As directed       Follow-up Information    Tania Ade, MD. Schedule an appointment as soon as possible for a visit in 1 week.   Specialty:  Orthopedic Surgery Contact information: [address] [place] [address] [phone]            Signed: Grier Mitts 05/19/2017, 8:37 AM

## 2017-05-19 NOTE — Plan of Care (Signed)
Problem: Pain Managment: Goal: General experience of comfort will improve Patient has been educated on the pain scales and knows when to call for pain medication.

## 2017-05-26 DIAGNOSIS — Z9889 Other specified postprocedural states: Secondary | ICD-10-CM | POA: Diagnosis not present

## 2017-05-29 DIAGNOSIS — M75122 Complete rotator cuff tear or rupture of left shoulder, not specified as traumatic: Secondary | ICD-10-CM | POA: Diagnosis not present

## 2017-05-29 DIAGNOSIS — M25512 Pain in left shoulder: Secondary | ICD-10-CM | POA: Diagnosis not present

## 2017-05-29 DIAGNOSIS — M25612 Stiffness of left shoulder, not elsewhere classified: Secondary | ICD-10-CM | POA: Diagnosis not present

## 2017-05-31 DIAGNOSIS — M25512 Pain in left shoulder: Secondary | ICD-10-CM | POA: Diagnosis not present

## 2017-05-31 DIAGNOSIS — M25612 Stiffness of left shoulder, not elsewhere classified: Secondary | ICD-10-CM | POA: Diagnosis not present

## 2017-05-31 DIAGNOSIS — M75122 Complete rotator cuff tear or rupture of left shoulder, not specified as traumatic: Secondary | ICD-10-CM | POA: Diagnosis not present

## 2017-06-06 DIAGNOSIS — M25512 Pain in left shoulder: Secondary | ICD-10-CM | POA: Diagnosis not present

## 2017-06-06 DIAGNOSIS — M25612 Stiffness of left shoulder, not elsewhere classified: Secondary | ICD-10-CM | POA: Diagnosis not present

## 2017-06-06 DIAGNOSIS — M75122 Complete rotator cuff tear or rupture of left shoulder, not specified as traumatic: Secondary | ICD-10-CM | POA: Diagnosis not present

## 2017-06-08 DIAGNOSIS — M25512 Pain in left shoulder: Secondary | ICD-10-CM | POA: Diagnosis not present

## 2017-06-08 DIAGNOSIS — M25612 Stiffness of left shoulder, not elsewhere classified: Secondary | ICD-10-CM | POA: Diagnosis not present

## 2017-06-08 DIAGNOSIS — M75122 Complete rotator cuff tear or rupture of left shoulder, not specified as traumatic: Secondary | ICD-10-CM | POA: Diagnosis not present

## 2017-06-16 ENCOUNTER — Other Ambulatory Visit (HOSPITAL_COMMUNITY): Payer: Medicare HMO

## 2017-06-16 DIAGNOSIS — M25512 Pain in left shoulder: Secondary | ICD-10-CM | POA: Diagnosis not present

## 2017-06-16 DIAGNOSIS — M75122 Complete rotator cuff tear or rupture of left shoulder, not specified as traumatic: Secondary | ICD-10-CM | POA: Diagnosis not present

## 2017-06-16 DIAGNOSIS — M25612 Stiffness of left shoulder, not elsewhere classified: Secondary | ICD-10-CM | POA: Diagnosis not present

## 2017-06-20 DIAGNOSIS — M25612 Stiffness of left shoulder, not elsewhere classified: Secondary | ICD-10-CM | POA: Diagnosis not present

## 2017-06-20 DIAGNOSIS — M75122 Complete rotator cuff tear or rupture of left shoulder, not specified as traumatic: Secondary | ICD-10-CM | POA: Diagnosis not present

## 2017-06-20 DIAGNOSIS — M25512 Pain in left shoulder: Secondary | ICD-10-CM | POA: Diagnosis not present

## 2017-06-23 DIAGNOSIS — M25612 Stiffness of left shoulder, not elsewhere classified: Secondary | ICD-10-CM | POA: Diagnosis not present

## 2017-06-23 DIAGNOSIS — M25512 Pain in left shoulder: Secondary | ICD-10-CM | POA: Diagnosis not present

## 2017-06-23 DIAGNOSIS — M75122 Complete rotator cuff tear or rupture of left shoulder, not specified as traumatic: Secondary | ICD-10-CM | POA: Diagnosis not present

## 2017-06-27 DIAGNOSIS — M75122 Complete rotator cuff tear or rupture of left shoulder, not specified as traumatic: Secondary | ICD-10-CM | POA: Diagnosis not present

## 2017-06-27 DIAGNOSIS — M25612 Stiffness of left shoulder, not elsewhere classified: Secondary | ICD-10-CM | POA: Diagnosis not present

## 2017-06-27 DIAGNOSIS — M25512 Pain in left shoulder: Secondary | ICD-10-CM | POA: Diagnosis not present

## 2017-06-30 DIAGNOSIS — M25512 Pain in left shoulder: Secondary | ICD-10-CM | POA: Diagnosis not present

## 2017-06-30 DIAGNOSIS — M25612 Stiffness of left shoulder, not elsewhere classified: Secondary | ICD-10-CM | POA: Diagnosis not present

## 2017-06-30 DIAGNOSIS — M75122 Complete rotator cuff tear or rupture of left shoulder, not specified as traumatic: Secondary | ICD-10-CM | POA: Diagnosis not present

## 2017-07-04 DIAGNOSIS — M25612 Stiffness of left shoulder, not elsewhere classified: Secondary | ICD-10-CM | POA: Diagnosis not present

## 2017-07-04 DIAGNOSIS — M25512 Pain in left shoulder: Secondary | ICD-10-CM | POA: Diagnosis not present

## 2017-07-04 DIAGNOSIS — M75122 Complete rotator cuff tear or rupture of left shoulder, not specified as traumatic: Secondary | ICD-10-CM | POA: Diagnosis not present

## 2017-07-06 DIAGNOSIS — M75122 Complete rotator cuff tear or rupture of left shoulder, not specified as traumatic: Secondary | ICD-10-CM | POA: Diagnosis not present

## 2017-07-06 DIAGNOSIS — M25512 Pain in left shoulder: Secondary | ICD-10-CM | POA: Diagnosis not present

## 2017-07-06 DIAGNOSIS — M25612 Stiffness of left shoulder, not elsewhere classified: Secondary | ICD-10-CM | POA: Diagnosis not present

## 2017-07-07 DIAGNOSIS — Z9889 Other specified postprocedural states: Secondary | ICD-10-CM | POA: Diagnosis not present

## 2017-07-12 DIAGNOSIS — M75122 Complete rotator cuff tear or rupture of left shoulder, not specified as traumatic: Secondary | ICD-10-CM | POA: Diagnosis not present

## 2017-07-12 DIAGNOSIS — M25612 Stiffness of left shoulder, not elsewhere classified: Secondary | ICD-10-CM | POA: Diagnosis not present

## 2017-07-12 DIAGNOSIS — M25512 Pain in left shoulder: Secondary | ICD-10-CM | POA: Diagnosis not present

## 2017-07-13 ENCOUNTER — Emergency Department (HOSPITAL_COMMUNITY): Payer: Medicare HMO

## 2017-07-13 ENCOUNTER — Inpatient Hospital Stay (HOSPITAL_COMMUNITY)
Admission: EM | Admit: 2017-07-13 | Discharge: 2017-08-07 | DRG: 291 | Disposition: A | Discharge status: Rehabilitation Facility | Payer: Medicare HMO | Attending: Cardiology | Admitting: Cardiology

## 2017-07-13 ENCOUNTER — Encounter (HOSPITAL_COMMUNITY): Payer: Self-pay | Admitting: Emergency Medicine

## 2017-07-13 ENCOUNTER — Inpatient Hospital Stay (HOSPITAL_COMMUNITY): Payer: Medicare HMO

## 2017-07-13 DIAGNOSIS — Z4659 Encounter for fitting and adjustment of other gastrointestinal appliance and device: Secondary | ICD-10-CM

## 2017-07-13 DIAGNOSIS — F17210 Nicotine dependence, cigarettes, uncomplicated: Secondary | ICD-10-CM | POA: Diagnosis present

## 2017-07-13 DIAGNOSIS — E1151 Type 2 diabetes mellitus with diabetic peripheral angiopathy without gangrene: Secondary | ICD-10-CM | POA: Diagnosis present

## 2017-07-13 DIAGNOSIS — K219 Gastro-esophageal reflux disease without esophagitis: Secondary | ICD-10-CM | POA: Diagnosis present

## 2017-07-13 DIAGNOSIS — I352 Nonrheumatic aortic (valve) stenosis with insufficiency: Secondary | ICD-10-CM

## 2017-07-13 DIAGNOSIS — J441 Chronic obstructive pulmonary disease with (acute) exacerbation: Secondary | ICD-10-CM

## 2017-07-13 DIAGNOSIS — Z4682 Encounter for fitting and adjustment of non-vascular catheter: Secondary | ICD-10-CM | POA: Diagnosis not present

## 2017-07-13 DIAGNOSIS — R0600 Dyspnea, unspecified: Secondary | ICD-10-CM | POA: Diagnosis not present

## 2017-07-13 DIAGNOSIS — I499 Cardiac arrhythmia, unspecified: Secondary | ICD-10-CM | POA: Diagnosis present

## 2017-07-13 DIAGNOSIS — I479 Paroxysmal tachycardia, unspecified: Secondary | ICD-10-CM

## 2017-07-13 DIAGNOSIS — M545 Low back pain, unspecified: Secondary | ICD-10-CM

## 2017-07-13 DIAGNOSIS — I340 Nonrheumatic mitral (valve) insufficiency: Secondary | ICD-10-CM | POA: Diagnosis present

## 2017-07-13 DIAGNOSIS — N179 Acute kidney failure, unspecified: Secondary | ICD-10-CM | POA: Diagnosis not present

## 2017-07-13 DIAGNOSIS — A419 Sepsis, unspecified organism: Secondary | ICD-10-CM | POA: Diagnosis not present

## 2017-07-13 DIAGNOSIS — R6521 Severe sepsis with septic shock: Secondary | ICD-10-CM | POA: Diagnosis not present

## 2017-07-13 DIAGNOSIS — Z781 Physical restraint status: Secondary | ICD-10-CM

## 2017-07-13 DIAGNOSIS — F329 Major depressive disorder, single episode, unspecified: Secondary | ICD-10-CM | POA: Diagnosis present

## 2017-07-13 DIAGNOSIS — R0603 Acute respiratory distress: Secondary | ICD-10-CM | POA: Diagnosis present

## 2017-07-13 DIAGNOSIS — I9589 Other hypotension: Secondary | ICD-10-CM | POA: Diagnosis not present

## 2017-07-13 DIAGNOSIS — E785 Hyperlipidemia, unspecified: Secondary | ICD-10-CM | POA: Diagnosis present

## 2017-07-13 DIAGNOSIS — J810 Acute pulmonary edema: Secondary | ICD-10-CM | POA: Diagnosis not present

## 2017-07-13 DIAGNOSIS — Z7189 Other specified counseling: Secondary | ICD-10-CM

## 2017-07-13 DIAGNOSIS — Z9049 Acquired absence of other specified parts of digestive tract: Secondary | ICD-10-CM | POA: Diagnosis not present

## 2017-07-13 DIAGNOSIS — D509 Iron deficiency anemia, unspecified: Secondary | ICD-10-CM | POA: Diagnosis present

## 2017-07-13 DIAGNOSIS — R570 Cardiogenic shock: Secondary | ICD-10-CM | POA: Diagnosis not present

## 2017-07-13 DIAGNOSIS — G92 Toxic encephalopathy: Secondary | ICD-10-CM | POA: Diagnosis not present

## 2017-07-13 DIAGNOSIS — M479 Spondylosis, unspecified: Secondary | ICD-10-CM | POA: Diagnosis present

## 2017-07-13 DIAGNOSIS — I110 Hypertensive heart disease with heart failure: Principal | ICD-10-CM | POA: Diagnosis present

## 2017-07-13 DIAGNOSIS — J449 Chronic obstructive pulmonary disease, unspecified: Secondary | ICD-10-CM | POA: Diagnosis present

## 2017-07-13 DIAGNOSIS — I481 Persistent atrial fibrillation: Secondary | ICD-10-CM | POA: Diagnosis not present

## 2017-07-13 DIAGNOSIS — Z515 Encounter for palliative care: Secondary | ICD-10-CM | POA: Diagnosis not present

## 2017-07-13 DIAGNOSIS — D649 Anemia, unspecified: Secondary | ICD-10-CM | POA: Diagnosis present

## 2017-07-13 DIAGNOSIS — R339 Retention of urine, unspecified: Secondary | ICD-10-CM | POA: Diagnosis not present

## 2017-07-13 DIAGNOSIS — Z713 Dietary counseling and surveillance: Secondary | ICD-10-CM | POA: Diagnosis not present

## 2017-07-13 DIAGNOSIS — G8929 Other chronic pain: Secondary | ICD-10-CM

## 2017-07-13 DIAGNOSIS — E870 Hyperosmolality and hypernatremia: Secondary | ICD-10-CM | POA: Diagnosis not present

## 2017-07-13 DIAGNOSIS — R51 Headache: Secondary | ICD-10-CM | POA: Diagnosis not present

## 2017-07-13 DIAGNOSIS — R5383 Other fatigue: Secondary | ICD-10-CM | POA: Diagnosis not present

## 2017-07-13 DIAGNOSIS — I10 Essential (primary) hypertension: Secondary | ICD-10-CM | POA: Diagnosis present

## 2017-07-13 DIAGNOSIS — I69391 Dysphagia following cerebral infarction: Secondary | ICD-10-CM | POA: Diagnosis not present

## 2017-07-13 DIAGNOSIS — I509 Heart failure, unspecified: Secondary | ICD-10-CM

## 2017-07-13 DIAGNOSIS — T424X5A Adverse effect of benzodiazepines, initial encounter: Secondary | ICD-10-CM | POA: Diagnosis not present

## 2017-07-13 DIAGNOSIS — J9690 Respiratory failure, unspecified, unspecified whether with hypoxia or hypercapnia: Secondary | ICD-10-CM

## 2017-07-13 DIAGNOSIS — I959 Hypotension, unspecified: Secondary | ICD-10-CM | POA: Diagnosis not present

## 2017-07-13 DIAGNOSIS — J9600 Acute respiratory failure, unspecified whether with hypoxia or hypercapnia: Secondary | ICD-10-CM

## 2017-07-13 DIAGNOSIS — F4323 Adjustment disorder with mixed anxiety and depressed mood: Secondary | ICD-10-CM

## 2017-07-13 DIAGNOSIS — J9601 Acute respiratory failure with hypoxia: Secondary | ICD-10-CM | POA: Diagnosis present

## 2017-07-13 DIAGNOSIS — D638 Anemia in other chronic diseases classified elsewhere: Secondary | ICD-10-CM | POA: Diagnosis not present

## 2017-07-13 DIAGNOSIS — F5102 Adjustment insomnia: Secondary | ICD-10-CM | POA: Diagnosis not present

## 2017-07-13 DIAGNOSIS — Z452 Encounter for adjustment and management of vascular access device: Secondary | ICD-10-CM

## 2017-07-13 DIAGNOSIS — E875 Hyperkalemia: Secondary | ICD-10-CM | POA: Diagnosis not present

## 2017-07-13 DIAGNOSIS — E872 Acidosis: Secondary | ICD-10-CM | POA: Diagnosis not present

## 2017-07-13 DIAGNOSIS — Z978 Presence of other specified devices: Secondary | ICD-10-CM

## 2017-07-13 DIAGNOSIS — R451 Restlessness and agitation: Secondary | ICD-10-CM

## 2017-07-13 DIAGNOSIS — Z23 Encounter for immunization: Secondary | ICD-10-CM | POA: Diagnosis present

## 2017-07-13 DIAGNOSIS — Z9911 Dependence on respirator [ventilator] status: Secondary | ICD-10-CM

## 2017-07-13 DIAGNOSIS — Z7982 Long term (current) use of aspirin: Secondary | ICD-10-CM | POA: Diagnosis not present

## 2017-07-13 DIAGNOSIS — Z91030 Bee allergy status: Secondary | ICD-10-CM

## 2017-07-13 DIAGNOSIS — G9341 Metabolic encephalopathy: Secondary | ICD-10-CM | POA: Diagnosis not present

## 2017-07-13 DIAGNOSIS — R0682 Tachypnea, not elsewhere classified: Secondary | ICD-10-CM | POA: Diagnosis not present

## 2017-07-13 DIAGNOSIS — R1312 Dysphagia, oropharyngeal phase: Secondary | ICD-10-CM | POA: Diagnosis not present

## 2017-07-13 DIAGNOSIS — R410 Disorientation, unspecified: Secondary | ICD-10-CM | POA: Diagnosis not present

## 2017-07-13 DIAGNOSIS — I4891 Unspecified atrial fibrillation: Secondary | ICD-10-CM | POA: Diagnosis present

## 2017-07-13 DIAGNOSIS — L8990 Pressure ulcer of unspecified site, unspecified stage: Secondary | ICD-10-CM | POA: Insufficient documentation

## 2017-07-13 DIAGNOSIS — I639 Cerebral infarction, unspecified: Secondary | ICD-10-CM | POA: Diagnosis not present

## 2017-07-13 DIAGNOSIS — R4020 Unspecified coma: Secondary | ICD-10-CM | POA: Diagnosis not present

## 2017-07-13 DIAGNOSIS — R1310 Dysphagia, unspecified: Secondary | ICD-10-CM

## 2017-07-13 DIAGNOSIS — Z6831 Body mass index (BMI) 31.0-31.9, adult: Secondary | ICD-10-CM | POA: Diagnosis not present

## 2017-07-13 DIAGNOSIS — R05 Cough: Secondary | ICD-10-CM | POA: Diagnosis not present

## 2017-07-13 DIAGNOSIS — J156 Pneumonia due to other aerobic Gram-negative bacteria: Secondary | ICD-10-CM | POA: Diagnosis not present

## 2017-07-13 DIAGNOSIS — Z720 Tobacco use: Secondary | ICD-10-CM

## 2017-07-13 DIAGNOSIS — R918 Other nonspecific abnormal finding of lung field: Secondary | ICD-10-CM | POA: Diagnosis not present

## 2017-07-13 DIAGNOSIS — G4733 Obstructive sleep apnea (adult) (pediatric): Secondary | ICD-10-CM | POA: Diagnosis present

## 2017-07-13 DIAGNOSIS — K5900 Constipation, unspecified: Secondary | ICD-10-CM | POA: Diagnosis not present

## 2017-07-13 DIAGNOSIS — R1313 Dysphagia, pharyngeal phase: Secondary | ICD-10-CM | POA: Diagnosis not present

## 2017-07-13 DIAGNOSIS — R519 Headache, unspecified: Secondary | ICD-10-CM

## 2017-07-13 DIAGNOSIS — I5031 Acute diastolic (congestive) heart failure: Secondary | ICD-10-CM | POA: Diagnosis not present

## 2017-07-13 DIAGNOSIS — M7989 Other specified soft tissue disorders: Secondary | ICD-10-CM | POA: Diagnosis not present

## 2017-07-13 DIAGNOSIS — M109 Gout, unspecified: Secondary | ICD-10-CM | POA: Diagnosis not present

## 2017-07-13 DIAGNOSIS — E876 Hypokalemia: Secondary | ICD-10-CM | POA: Diagnosis not present

## 2017-07-13 DIAGNOSIS — K761 Chronic passive congestion of liver: Secondary | ICD-10-CM | POA: Diagnosis not present

## 2017-07-13 DIAGNOSIS — I5033 Acute on chronic diastolic (congestive) heart failure: Secondary | ICD-10-CM

## 2017-07-13 DIAGNOSIS — R5381 Other malaise: Secondary | ICD-10-CM | POA: Diagnosis present

## 2017-07-13 DIAGNOSIS — G9340 Encephalopathy, unspecified: Secondary | ICD-10-CM | POA: Diagnosis present

## 2017-07-13 DIAGNOSIS — G8220 Paraplegia, unspecified: Secondary | ICD-10-CM | POA: Diagnosis not present

## 2017-07-13 DIAGNOSIS — Z974 Presence of external hearing-aid: Secondary | ICD-10-CM

## 2017-07-13 DIAGNOSIS — Z8673 Personal history of transient ischemic attack (TIA), and cerebral infarction without residual deficits: Secondary | ICD-10-CM

## 2017-07-13 DIAGNOSIS — Z823 Family history of stroke: Secondary | ICD-10-CM

## 2017-07-13 DIAGNOSIS — I471 Supraventricular tachycardia: Secondary | ICD-10-CM | POA: Diagnosis not present

## 2017-07-13 DIAGNOSIS — K7200 Acute and subacute hepatic failure without coma: Secondary | ICD-10-CM | POA: Diagnosis not present

## 2017-07-13 DIAGNOSIS — I350 Nonrheumatic aortic (valve) stenosis: Secondary | ICD-10-CM | POA: Diagnosis not present

## 2017-07-13 DIAGNOSIS — R001 Bradycardia, unspecified: Secondary | ICD-10-CM | POA: Diagnosis not present

## 2017-07-13 DIAGNOSIS — E878 Other disorders of electrolyte and fluid balance, not elsewhere classified: Secondary | ICD-10-CM | POA: Diagnosis not present

## 2017-07-13 DIAGNOSIS — I69331 Monoplegia of upper limb following cerebral infarction affecting right dominant side: Secondary | ICD-10-CM | POA: Diagnosis present

## 2017-07-13 DIAGNOSIS — Z66 Do not resuscitate: Secondary | ICD-10-CM | POA: Diagnosis not present

## 2017-07-13 DIAGNOSIS — D62 Acute posthemorrhagic anemia: Secondary | ICD-10-CM

## 2017-07-13 DIAGNOSIS — R29898 Other symptoms and signs involving the musculoskeletal system: Secondary | ICD-10-CM | POA: Diagnosis not present

## 2017-07-13 DIAGNOSIS — R4182 Altered mental status, unspecified: Secondary | ICD-10-CM | POA: Diagnosis not present

## 2017-07-13 DIAGNOSIS — F419 Anxiety disorder, unspecified: Secondary | ICD-10-CM | POA: Diagnosis present

## 2017-07-13 DIAGNOSIS — J440 Chronic obstructive pulmonary disease with acute lower respiratory infection: Secondary | ICD-10-CM | POA: Diagnosis not present

## 2017-07-13 DIAGNOSIS — R0602 Shortness of breath: Secondary | ICD-10-CM | POA: Diagnosis present

## 2017-07-13 DIAGNOSIS — Z86010 Personal history of colonic polyps: Secondary | ICD-10-CM

## 2017-07-13 DIAGNOSIS — N170 Acute kidney failure with tubular necrosis: Secondary | ICD-10-CM | POA: Diagnosis not present

## 2017-07-13 DIAGNOSIS — Z9889 Other specified postprocedural states: Secondary | ICD-10-CM | POA: Diagnosis not present

## 2017-07-13 DIAGNOSIS — N183 Chronic kidney disease, stage 3 (moderate): Secondary | ICD-10-CM | POA: Diagnosis present

## 2017-07-13 DIAGNOSIS — D631 Anemia in chronic kidney disease: Secondary | ICD-10-CM | POA: Diagnosis present

## 2017-07-13 DIAGNOSIS — E874 Mixed disorder of acid-base balance: Secondary | ICD-10-CM | POA: Diagnosis not present

## 2017-07-13 DIAGNOSIS — I080 Rheumatic disorders of both mitral and aortic valves: Secondary | ICD-10-CM | POA: Diagnosis present

## 2017-07-13 DIAGNOSIS — M6281 Muscle weakness (generalized): Secondary | ICD-10-CM | POA: Diagnosis not present

## 2017-07-13 DIAGNOSIS — E1165 Type 2 diabetes mellitus with hyperglycemia: Secondary | ICD-10-CM | POA: Diagnosis present

## 2017-07-13 DIAGNOSIS — I480 Paroxysmal atrial fibrillation: Secondary | ICD-10-CM | POA: Diagnosis present

## 2017-07-13 DIAGNOSIS — Z0189 Encounter for other specified special examinations: Secondary | ICD-10-CM

## 2017-07-13 DIAGNOSIS — Z4789 Encounter for other orthopedic aftercare: Secondary | ICD-10-CM | POA: Diagnosis not present

## 2017-07-13 DIAGNOSIS — E669 Obesity, unspecified: Secondary | ICD-10-CM | POA: Diagnosis present

## 2017-07-13 DIAGNOSIS — I130 Hypertensive heart and chronic kidney disease with heart failure and stage 1 through stage 4 chronic kidney disease, or unspecified chronic kidney disease: Secondary | ICD-10-CM | POA: Diagnosis not present

## 2017-07-13 HISTORY — DX: Nonrheumatic aortic (valve) stenosis: I35.0

## 2017-07-13 HISTORY — DX: Anemia, unspecified: D64.9

## 2017-07-13 LAB — COMPREHENSIVE METABOLIC PANEL
ALBUMIN: 3.3 g/dL — AB (ref 3.5–5.0)
ALT: 22 U/L (ref 17–63)
AST: 28 U/L (ref 15–41)
Alkaline Phosphatase: 36 U/L — ABNORMAL LOW (ref 38–126)
Anion gap: 7 (ref 5–15)
BUN: 22 mg/dL — AB (ref 6–20)
CHLORIDE: 108 mmol/L (ref 101–111)
CO2: 21 mmol/L — AB (ref 22–32)
CREATININE: 1.2 mg/dL (ref 0.61–1.24)
Calcium: 8.8 mg/dL — ABNORMAL LOW (ref 8.9–10.3)
GFR calc Af Amer: >60 mL/min (ref >60)
GFR, EST NON AFRICAN AMERICAN: 59 mL/min — AB (ref >60)
GLUCOSE: 102 mg/dL — AB (ref 65–99)
POTASSIUM: 4.2 mmol/L (ref 3.5–5.1)
Sodium: 136 mmol/L (ref 135–145)
Total Bilirubin: 0.5 mg/dL (ref 0.3–1.2)
Total Protein: 6.4 g/dL — ABNORMAL LOW (ref 6.5–8.1)

## 2017-07-13 LAB — I-STAT TROPONIN, ED: Troponin i, poc: 0.1 ng/mL (ref 0.00–0.08)

## 2017-07-13 LAB — CBC
HEMATOCRIT: 26.6 % — AB (ref 39.0–52.0)
Hemoglobin: 8.6 g/dL — ABNORMAL LOW (ref 13.0–17.0)
MCH: 30.8 pg (ref 26.0–34.0)
MCHC: 32.3 g/dL (ref 30.0–36.0)
MCV: 95.3 fL (ref 78.0–100.0)
Platelets: 343 10*3/uL (ref 150–400)
RBC: 2.79 MIL/uL — ABNORMAL LOW (ref 4.22–5.81)
RDW: 14.7 % (ref 11.5–15.5)
WBC: 13.5 10*3/uL — AB (ref 4.0–10.5)

## 2017-07-13 LAB — BRAIN NATRIURETIC PEPTIDE: B NATRIURETIC PEPTIDE 5: 408.4 pg/mL — AB (ref 0.0–100.0)

## 2017-07-13 LAB — TSH: TSH: 2.236 u[IU]/mL (ref 0.350–4.500)

## 2017-07-13 LAB — TROPONIN I
TROPONIN I: 0.12 ng/mL — AB (ref <0.03)
Troponin I: 0.09 ng/mL (ref <0.03)

## 2017-07-13 MED ORDER — ZOLPIDEM TARTRATE 5 MG PO TABS: 5.0000 mg | ORAL_TABLET | Freq: Every evening | ORAL | Status: DC | PRN

## 2017-07-13 MED ORDER — SODIUM CHLORIDE 0.9% FLUSH
3.0000 mL | Freq: Two times a day (BID) | INTRAVENOUS | Status: DC
  Administered 2017-07-13 – 2017-07-17 (×6): 3 mL via INTRAVENOUS
  Administered 2017-07-21: 10 mL via INTRAVENOUS
  Administered 2017-07-22 – 2017-08-03 (×7): 3 mL via INTRAVENOUS

## 2017-07-13 MED ORDER — FUROSEMIDE 10 MG/ML IJ SOLN
40.0000 mg | Freq: Once | INTRAMUSCULAR | Status: DC
  Filled 2017-07-13: qty 4

## 2017-07-13 MED ORDER — SODIUM CHLORIDE 0.9 % IV SOLN: 250.0000 mL | INTRAVENOUS | Status: DC | PRN

## 2017-07-13 MED ORDER — SODIUM CHLORIDE 0.9% FLUSH
3.0000 mL | INTRAVENOUS | Status: DC | PRN
  Administered 2017-07-30: 3 mL via INTRAVENOUS
  Filled 2017-07-13: qty 3

## 2017-07-13 MED ORDER — POTASSIUM CHLORIDE CRYS ER 20 MEQ PO TBCR
20.0000 meq | EXTENDED_RELEASE_TABLET | Freq: Once | ORAL | Status: AC
  Administered 2017-07-13: 20 meq via ORAL
  Filled 2017-07-13: qty 1

## 2017-07-13 MED ORDER — ONDANSETRON HCL 4 MG/2ML IJ SOLN
4.0000 mg | Freq: Four times a day (QID) | INTRAMUSCULAR | Status: DC | PRN
  Administered 2017-07-20: 4 mg via INTRAVENOUS
  Filled 2017-07-13: qty 2

## 2017-07-13 MED ORDER — ASPIRIN 81 MG PO CHEW: 324.0000 mg | CHEWABLE_TABLET | ORAL | Status: AC

## 2017-07-13 MED ORDER — DILTIAZEM HCL 100 MG IV SOLR
5.0000 mg/h | INTRAVENOUS | Status: DC
  Filled 2017-07-13: qty 100

## 2017-07-13 MED ORDER — NITROGLYCERIN 0.4 MG SL SUBL: 0.4000 mg | SUBLINGUAL_TABLET | SUBLINGUAL | Status: DC | PRN

## 2017-07-13 MED ORDER — ASPIRIN 81 MG PO CHEW
243.0000 mg | CHEWABLE_TABLET | Freq: Once | ORAL | Status: AC
  Administered 2017-07-13: 243 mg via ORAL
  Filled 2017-07-13: qty 3

## 2017-07-13 MED ORDER — ALPRAZOLAM 0.25 MG PO TABS
0.2500 mg | ORAL_TABLET | Freq: Two times a day (BID) | ORAL | Status: DC | PRN
  Administered 2017-07-13: 0.25 mg via ORAL
  Filled 2017-07-13: qty 1

## 2017-07-13 MED ORDER — ENOXAPARIN SODIUM 40 MG/0.4ML ~~LOC~~ SOLN
40.0000 mg | SUBCUTANEOUS | Status: DC
  Administered 2017-07-13 – 2017-07-17 (×5): 40 mg via SUBCUTANEOUS
  Filled 2017-07-13 (×5): qty 0.4

## 2017-07-13 MED ORDER — ACETAMINOPHEN 325 MG PO TABS: 650.0000 mg | ORAL_TABLET | ORAL | Status: DC | PRN

## 2017-07-13 MED ORDER — METOPROLOL TARTRATE 25 MG PO TABS
25.0000 mg | ORAL_TABLET | Freq: Two times a day (BID) | ORAL | Status: DC
  Administered 2017-07-13: 25 mg via ORAL
  Filled 2017-07-13: qty 1

## 2017-07-13 MED ORDER — SODIUM CHLORIDE 0.9% FLUSH
3.0000 mL | Freq: Two times a day (BID) | INTRAVENOUS | Status: DC
  Administered 2017-07-13 – 2017-07-20 (×8): 3 mL via INTRAVENOUS
  Administered 2017-07-21: 10 mL via INTRAVENOUS
  Administered 2017-07-22 – 2017-07-23 (×3): 3 mL via INTRAVENOUS

## 2017-07-13 MED ORDER — SODIUM CHLORIDE 0.9% FLUSH
3.0000 mL | INTRAVENOUS | Status: DC | PRN
  Administered 2017-07-15 – 2017-07-24 (×2): 3 mL via INTRAVENOUS
  Filled 2017-07-13 (×2): qty 3

## 2017-07-13 MED ORDER — FUROSEMIDE 10 MG/ML IJ SOLN
40.0000 mg | Freq: Once | INTRAMUSCULAR | Status: AC
  Administered 2017-07-13: 40 mg via INTRAVENOUS
  Filled 2017-07-13: qty 4

## 2017-07-13 MED ORDER — METOPROLOL TARTRATE 5 MG/5ML IV SOLN
2.5000 mg | INTRAVENOUS | Status: DC | PRN
  Administered 2017-07-13 – 2017-07-23 (×7): 2.5 mg via INTRAVENOUS
  Filled 2017-07-13 (×9): qty 5

## 2017-07-13 MED ORDER — SODIUM CHLORIDE 0.9 % IV SOLN
INTRAVENOUS | Status: DC
  Administered 2017-07-14 – 2017-07-26 (×5): via INTRAVENOUS

## 2017-07-13 MED ORDER — DILTIAZEM LOAD VIA INFUSION
10.0000 mg | Freq: Once | INTRAVENOUS | Status: DC
  Filled 2017-07-13 (×3): qty 10

## 2017-07-13 NOTE — ED Provider Notes (Signed)
Patient is an admitted patient to the cardiology service. I was called to the room by a tech who advised the patient had quite rapidly becoming extremely short of breath and diaphoretic. Monitor showed rhythm consistent with atrial fibrillation rate 140s to 160s. Patient's wife at bedside reports this all came on within the past couple of minutes. She states this has been happening on and off hencewise there presentation to the emergency department. Patient is denying active chest pain at this time. He reports feeling extremely short of breath. On examination, he is profusely diaphoretic and pale. Patient has significant work of breathing and is in tripod position sitting on the edge of the bed. Heart is tachycardic irregularly irregular. Patient has fair air flow with expiratory wheeze that sounds more like upper airway forced expiration. Nurse reports patient has been given 40 of Lasix but has not really been urinating much yet. She is just getting the previous order to administer Lopressor 2.5 mg. I have added orders of stat portable chest, repeat EKG and BiPAP. We will need to rule out flash pulmonary edema. Patient's symptoms are likely the result of paroxysmal atrial fibrillation if this is but has been occurring periodically at home. Will see how much effect the Lopressor 2.5 mg has. We'll need to consider starting Cardizem.  I did call Dr. Katharina Caper directly after this assessment. He agrees with initiating Cardizem and will come to the emergency department to reassess the patient.  Wet read chest x-ray performed by myself at bedside consistent with pulmonary edema.  CRITICAL CARE Performed by: Charlesetta Shanks   Total critical care time: 30 minutes  Critical care time was exclusive of separately billable procedures and treating other patients.  Critical care was necessary to treat or prevent imminent or life-threatening deterioration.  Critical care was time spent personally by me on the  following activities: development of treatment plan with patient and/or surrogate as well as nursing, discussions with consultants, evaluation of patient's response to treatment, examination of patient, obtaining history from patient or surrogate, ordering and performing treatments and interventions, ordering and review of laboratory studies, ordering and review of radiographic studies, pulse oximetry and re-evaluation of patient's condition.    Charlesetta Shanks, MD 07/13/17 [phone]

## 2017-07-13 NOTE — ED Notes (Signed)
Pt was seen by rhoinda the pa for cards.  He was nauseated but that has gone away  He now c/o leg and body cramps that he usually has frequently

## 2017-07-13 NOTE — H&P (Signed)
Cardiology History and Physical   Patient ID: Brandon [name]; [id number]; [birth year]   Admit date: 07/13/2017 Date of Consult: 07/13/2017  Primary Care Provider: Maurice Small, MD Primary Cardiologist: Dr Harrington Challenger 05/05/2017 Primary Electrophysiologist:  N/a   Patient Profile:   Brandon [name] is a 71 y.o. male with a hx of CVA, HTN, HL, AS, Brandon, [name], tobacco use, remote hx colon polyps, who is being seen today for the evaluation of CP, diaphoresis, elevated troponin at the request of Dr Stark Jock.  History of Present Illness:   Brandon [name] had a TEE to evaluate his MV and AoV 05/09/2017. He tolerated the procedure well. The Brandon was moderate, AS was moderate, f/u in September felt ok.   Pt had L rotator cuff surgery 05/18/2017 and tolerated the procedure well. Pre-procedure CBC showed H&H 11.0/33.8, no significant blood loss documented during the surgery.   After the surgery, Brandon [name] had a spell like he has been having today. He also had one the day after surgery. There was no explanation for the spells, the anesthesiologist suspected they were from a reaction to the anesthesia. He was held overnight because of the spells, but was not on telemetry.  He did well after d/c from the hospital, until today. He was fine when he went to bed last pm. He was watching TV and started getting very hot, felt SOB, like he could not get enough air. This was about 12:45 am. He got a fan on his face and that helped. He was also nauseated. He was coughing up large amounts of phlegm. The phlegm was greenish-brown. After about 45 minutes, he felt better, but could not get out of the chair due to weakness and incomplete recovery of L shoulder. He slept in the recliner till about 5 am.   The morning was ok, but he had another spell about 11:45 am. He was alone, he called his wife, she came home and called 911. The symptoms were the same but did not resolve>>so he came in. The SOB and increased WOB were  associated with chest fullness and pressure.    In the ER, the O2 has helped his sx. His sx resolved after being in the ER, but then came back again. On telemetry, he has had multiple short runs of ?atrial tach and NSVT as well. It is not clear if these correlate with his hot flashes or SOB.   He is not aware of any source of blood loss. He denies black or tarry stools, no GERD sx, no constipation or diarrhea. He has not felt weak or dizzy, except during the episodes. He has not had fevers or chills. No signs of infection at the surgical site, he has been attending PT and doing well with that. He has been having leg cramps, they are severe at times. They are recent in onset.   Past Medical History:  Diagnosis Date  . Anemia 07/13/2017  . Anxiety   . Aortic insufficiency   . Aortic stenosis, moderate 07/13/2017  . Arthritis    back   . Carotid stenosis    Right carotid stent (widely patent) 40 - 59% left plaque 11/13  . Depression   . Dyslipidemia   . GERD (gastroesophageal reflux disease)   . Heart murmur   . Hemiplegia affecting unspecified side, late effect of cerebrovascular disease    resolved- from L side   . Hypertension   . Jaundice    resolved following ERCP & Cholecystectomy  .  Mitral valve insufficiency and aortic valve insufficiency   . Pre-diabetes    per spouse  . Sleep apnea    does not wear CPAP  . Sleep concern    resulted in surgery- after + sleep test. Pt. doesn't have a problem any longer.   . Stroke ([place]) 03/11/2003   stent placed on the 31, 3, 2004, L side   . Wears glasses   . Wears hearing aid in both ears   . Wears partial dentures     Past Surgical History:  Procedure Laterality Date  . BACK SURGERY     lumbar back  . CHOLECYSTECTOMY    . ERCP N/A 05/31/2013   Procedure: ENDOSCOPIC RETROGRADE CHOLANGIOPANCREATOGRAPHY (ERCP);  Surgeon: Ladene Artist, MD;  Location: [facility];  Service: Endoscopy;  Laterality: N/A;  . FOOT SURGERY     right  .  LAPAROSCOPIC CHOLECYSTECTOMY SINGLE PORT N/A 06/01/2013   Procedure: LAPAROSCOPIC CHOLECYSTECTOMY SINGLE PORT;  Surgeon: Adin Hector, MD;  Location: [facility];  Service: General;  Laterality: N/A;  . POLYPECTOMY    . SHOULDER ARTHROSCOPY WITH ROTATOR CUFF REPAIR AND SUBACROMIAL DECOMPRESSION Left 05/18/2017  . SHOULDER ARTHROSCOPY WITH ROTATOR CUFF REPAIR AND SUBACROMIAL DECOMPRESSION Left 05/18/2017   Procedure: SHOULDER ARTHROSCOPY WITH ROTATOR CUFF REPAIR AND SUBACROMIAL DECOMPRESSION;  Surgeon: Tania Ade, MD;  Location: [facility];  Service: Orthopedics;  Laterality: Left;  LEFT SHOULDER ARTHROSCOPY WITH ROTATOR CUFF REPAIR AND SUBACROMIAL DECOMPRESSION  . TEE WITHOUT CARDIOVERSION N/A 05/09/2017   Procedure: TRANSESOPHAGEAL ECHOCARDIOGRAM (TEE);  Surgeon: Skeet Latch, MD;  Location: [facility];  Service: Cardiovascular;  Laterality: N/A;  . TONSILLECTOMY       Inpatient Medications: Scheduled Meds: . aspirin  243 mg Oral Once   Medication Sig  aspirin EC 81 MG tablet Take 81 mg by mouth daily.    aspirin-sod bicarb-citric acid (ALKA-SELTZER) 325 MG TBEF tablet Take 650 mg by mouth daily as needed (indigestion).  buPROPion (WELLBUTRIN SR) 150 MG 12 hr tablet Take 150 mg by mouth 2 (two) times daily.   calcium carbonate (TUMS - DOSED IN MG ELEMENTAL CALCIUM) 500 MG chewable tablet Chew 2 tablets by mouth daily as needed for indigestion or heartburn.  citalopram (CELEXA) 20 MG tablet Take 20 mg by mouth daily before breakfast.   docusate sodium (COLACE) 100 MG capsule Take 1 capsule (100 mg total) by mouth 3 (three) times daily as needed.  EPIPEN 2-PAK 0.3 MG/0.3ML SOAJ injection Inject 0.3 mg into the muscle as needed (allergic reaction). As directed  lisinopril (PRINIVIL,ZESTRIL) 20 MG tablet Take 20 mg by mouth at bedtime.   lovastatin (MEVACOR) 20 MG tablet Take 1 tablet (20 mg total) by mouth at bedtime.  Multiple Vitamin (MULTIVITAMIN WITH MINERALS) TABS tablet Take 1 tablet  by mouth daily.  omeprazole (PRILOSEC) 20 MG capsule Take 20 mg by mouth at bedtime.   oxyCODONE-acetaminophen (ROXICET) 5-325 MG tablet Take 1-2 tablets by mouth every 4 (four) hours as needed for severe pain.  Pseudoeph-Doxylamine-DM-APAP (NYQUIL PO) Take 1 Dose by mouth at bedtime as needed (cough).  traMADol (ULTRAM) 50 MG tablet Take 50-100 mg by mouth every 6 (six) hours as needed for moderate pain.   Allergies:    Allergies  Allergen Reactions  . Bee Venom Anaphylaxis and Swelling   Social History:   Social History   Social History  . Marital status: Married    Spouse name: N/A  . Number of children: 3  . Years of education: N/A  Occupational History  .  Retired   Social History Main Topics  . Smoking status: Current Every Day Smoker    Packs/day: 0.50    Years: 57.00    Types: Cigarettes  . Smokeless tobacco: Never Used  . Alcohol use 1.2 - 1.8 oz/week    2 - 3 Glasses of wine per week     Comment: moderate wine  . Drug use: No  . Sexual activity: Yes   Other Topics Concern  . Not on file   Social History Narrative   Two living children.  Lives with wife.      Family History:   The patient's family history includes Angina in his mother; Stroke in his father. Pt indicated that his mother is deceased. He indicated that his father is deceased.    ROS:  Please see the history of present illness.  All other ROS reviewed and negative.      Physical Exam/Data:   Vitals:   07/13/17 1338 07/13/17 1342 07/13/17 1400 07/13/17 1500  BP:  (!) 123/104 120/77 128/73  Pulse:  (!) 102 95 97  Resp:  16 20 (!) 22  Temp:  98.6 F (37 C)    TempSrc:  Oral    SpO2: 98% 96% 96% 97%  Weight:  242 lb (109.8 kg)    Height:  6' (1.829 m)     No intake or output data in the 24 hours ending 07/13/17 1630 Filed Weights   07/13/17 1342  Weight: 242 lb (109.8 kg)   Body mass index is 32.82 kg/m.  General:  Well nourished, well developed, in no acute distress HEENT:  normal Lymph: no adenopathy Neck: JVD 11 CM Endocrine:  No thryomegaly Vascular: R>L carotid bruits; 4/4 extremity pulses 2+ bilaterally   Cardiac:  normal S1, S2; RRR; AS and Brandon murmurs noted Lungs: decreased BS in bases w/ some rales bilaterally, slight exp wheezing, no rhonchi Abd: soft, nontender, no hepatomegaly  Ext: no edema Musculoskeletal:  No deformities, BUE and BLE strength normal and equal Skin: warm and dry  Neuro:  CNs 2-12 intact, no focal abnormalities noted Psych:  Normal affect   EKG:  The EKG was personally reviewed and demonstrates:  SR, no sig change from 09/02/2016 ECG except later R wave progression, ?2nd lead placement Telemetry:  Telemetry was personally reviewed and demonstrates:  SR, brief runs of NSVT and ?atrial tach  Relevant CV Studies: TEE: 05/09/2017 - Left ventricle: Systolic function was normal. The estimated   ejection fraction was in the range of 55% to 60%. - Aortic valve: There was moderate stenosis. There was moderate   regurgitation. Peak velocity (S): 413.19 cm/s. Mean gradient (S):   33 mm Hg. Regurgitation pressure half-time: 257 ms. - Mitral valve: Mobility of the posterior leaflet was moderately   restricted. There was moderate regurgitation directed posteriorly. - Left atrium: No evidence of thrombus in the atrial cavity or appendage. - Right ventricle: The cavity size was normal. Wall thickness was   normal. Systolic function was normal. - Right atrium: No evidence of thrombus in the atrial cavity or   appendage. - Atrial septum: No defect or patent foramen ovale was identified   by color flow Doppler. - Tricuspid valve: No evidence of vegetation. There was no regurgitation. - Pulmonic valve: No evidence of vegetation. Impressions: - Mitral regurgitation is posteriorly directed and mild by PISA   calculation. However, given the eccentricity of the jet, it is   likely moderate.  ECHO: 04/27/2017 -  Left ventricle: The cavity  size was moderately dilated. Wall   thickness was normal. Systolic function was normal. The estimated   ejection fraction was in the range of 55% to 60%. - Aortic valve: STable gradients since September 2017. There was   moderate stenosis. There was mild to moderate regurgitation. Mean   gradient (S): 20 mm Hg. Peak gradient (S): 36 mm Hg. Valve area   (VTI): 1.23 cm^2. Valve area (Vmax): 1.13 cm^2. Valve area   (Vmean): 1.08 cm^2. - Mitral valve: Calcified annulus. There was moderate to severe regurgitation. - Left atrium: The atrium was moderately dilated. - Right atrium:  The atrium was normal in size. - Atrial septum: No defect or patent foramen ovale was identified. - Pulmonary arteries: PA peak pressure: 32 mm Hg (S).  Laboratory Data:  Chemistry  Recent Labs Lab 07/13/17 1341  NA 136  K 4.2  CL 108  CO2 21*  GLUCOSE 102*  BUN 22*  CREATININE 1.20  CALCIUM 8.8*  GFRNONAA 59*  GFRAA >60  ANIONGAP 7     Recent Labs Lab 07/13/17 1341  PROT 6.4*  ALBUMIN 3.3*  AST 28  ALT 22  ALKPHOS 36*  BILITOT 0.5   Hematology  Recent Labs Lab 07/13/17 1341  WBC 13.5*  RBC 2.79*  HGB 8.6*  HCT 26.6*  MCV 95.3  MCH 30.8  MCHC 32.3  RDW 14.7  PLT 343   Cardiac Enzymes  Recent Labs Lab 07/13/17 1458  TROPONINI 0.09*     Recent Labs Lab 07/13/17 1347  TROPIPOC 0.10*    BNP  Recent Labs Lab 07/13/17 1341  BNP 408.4*    No results found for: DDIMER   Radiology/Studies:  Dg Chest Portable 1 View  Result Date: 07/13/2017 CLINICAL DATA:  Sob, Hx of aortic insufficiency, heart murmur, hypertension, stroke EXAM: PORTABLE CHEST 1 VIEW COMPARISON:  Chest x-ray dated 08/23/2006. FINDINGS: Mild cardiomegaly, possibly accentuated by slightly lordotic patient positioning. Atherosclerotic changes noted at the aortic arch. Central pulmonary vascular congestion and mild bilateral interstitial edema. No confluent opacity to suggest a developing pneumonia. No pleural  effusion or pneumothorax seen. No acute or suspicious osseous finding. IMPRESSION: 1. Cardiomegaly with central pulmonary vascular congestion and mild bilateral interstitial edema suggesting mild CHF/volume overload. Heart size may be accentuated by lordotic patient positioning. 2. Aortic atherosclerosis. Electronically Signed   By: Franki Cabot M.D.   On: 07/13/2017 14:15    Assessment and Plan:   Principal Problem: 1.  Arrhythmia - He is having 2 different arrhythmias. Will have to determine if they are causing the SOB - will ck TSH - EP can see in am - add BB  Active Problems:   Essential hypertension - watch BP during the spells and on the BB    Non-rheumatic mitral regurgitation   Aortic stenosis, moderate - watch carefully  - He has some volume overload by exam, CXR, and BNP - will give Lasix 40 mg IV x 1, follow volume status    Anemia - Dr Stark Jock did a rectal exam that was mildly heme positive. - pt not aware of any source of blood loss.  - MCV is normal - continue to follow  - GI will need to see, ? In am    Leg Cramps - ck Mg, K+ is > 4    Chest pain, moderate risk of CAD - mild elevation in troponin, initially flat trend - continue to cycle. - with anemia, will not add heparin, DVT Lovenox  Augusto Garbe  07/13/2017 4:30 PM   Attending Note:   The patient was seen and examined.  Agree with assessment and plan as noted above.  Changes made to the above note as needed.  Patient seen and independently examined with Rosaria Ferries, PA .   We discussed all aspects of the encounter. I agree with the assessment and plan as stated above.  1. Shortness breath: His symptoms of episodic shortness of breath somewhat worrisome. He has known aortic insufficiency and mitral regurgitation. There is some question as to whether or not his AI and Brandon have worsened recently.  He has been having these spells of episodic shortness of breath for the past week or  so. These typically last for 45 minutes. Associated with the feeling of feeling very hot and the inability to take a deep breath. He also has diaphoresis. He denies any episodes of chest pain or chest tightness. Despite the lack of pain, I'm more that these might be an angina equivalent. I think he probably needs a right left heart catheterization.  He has known valvular disease do not think that we necessarily need to consider PCI. Given his GI bleed, it may be best to avoid PCI for right now.  2. Paroxysmal atrial fibrillation: The patient has documented episodes of short burst of atrial fibrillation. At this point I do not know how long they last. His niece confirmed  the fact that these episodes of atrial fibrillation occur when he short of breath. At this point it's not clear whether the atrial fibrillation is the initiating event or is he having episodes of unstable angina which then leads to increased adrenaline and paroxysmal A. Fib. His CHADS2 VASC is at least 2   ( age 78,  HTN )  We do not know about the presence of CAD .   He has boderline DM.   Given his anemia and the fact that he is guaiac-positive, I think it would be best to hold off on anticoagulation for now.  3. Aortic insufficiency/aortic stenosis:   We'll review his echocardiograms.   We'll discuss this further with Dr. Harrington Challenger.   I have spent a total of 40 minutes with patient reviewing hospital  notes , telemetry, EKGs, labs and examining patient as well as establishing an assessment and plan that was discussed with the patient. > 50% of time was spent in direct patient care.    Thayer Headings, Brooke Bonito., MD, [facility] 07/13/2017, 6:08 PM [address],  [place] Pager [phone]

## 2017-07-13 NOTE — ED Notes (Signed)
Attempted Bladder scan for urinary retention. Bladder scan states bladder not seen. Pt feels full, states he is uncomfortable and is unable to urinate. Foley Cath to be placed.

## 2017-07-13 NOTE — ED Notes (Signed)
Dr Colvin Caroli called dr Acie Fredrickson  Dr Acie Fredrickson is here to see the pt

## 2017-07-13 NOTE — ED Notes (Signed)
Sandwich given 

## 2017-07-13 NOTE — ED Notes (Signed)
Pt's wife stated that pt had an episode of being restless -- pt had a run of VTach during that time, pt denies chest pain.

## 2017-07-13 NOTE — ED Notes (Signed)
Med given 

## 2017-07-13 NOTE — ED Provider Notes (Signed)
[facility] DEPT Provider Note   CSN: [id number] Arrival date & time: 07/13/17  1337     History   Chief Complaint Chief Complaint  Patient presents with  . Shortness of Breath    HPI Brandon [name] is a 71 y.o. male.  Patient is a 71 year old male with past medical history of mitral valve prolapse, aortic insufficiency, hypertension, prior CVA. He presents today for evaluation of dyspnea. He reports at approximately 1145 this morning developing the acute onset of dyspnea with associated nausea, diaphoresis. He felt very hot and needed to turn on a fan to help him breathe" him down. This episode lasted for approximately 45 minutes. EMS was called and he was transported here. His symptoms have since significantly improved.   The history is provided by the patient.  Shortness of Breath  This is a new problem. The average episode lasts 45 minutes. The problem occurs continuously.Episode onset: 6568. The problem has been resolved. Associated symptoms include chest pain. Pertinent negatives include no fever, no cough and no sputum production. He has tried nothing for the symptoms. The treatment provided no relief.    Past Medical History:  Diagnosis Date  . Anxiety   . Aortic insufficiency   . Arthritis    back   . Carotid stenosis    Right carotid stent (widely patent) 40 - 59% left plaque 11/13  . Depression   . Dyslipidemia   . GERD (gastroesophageal reflux disease)   . Heart murmur   . Hemiplegia affecting unspecified side, late effect of cerebrovascular disease    resolved- from L side   . Hypertension   . Jaundice    resolved following ERCP & Cholecystectomy  . Mitral valve insufficiency and aortic valve insufficiency   . Pre-diabetes    per spouse  . Sleep apnea    does not wear CPAP  . Sleep concern    resulted in surgery- after + sleep test. Pt. doesn't hacve a problem any longer.   . Stroke ([place]) 03/11/2003   stent placed on the 31, 3, 2004, L side   .  Wears glasses   . Wears hearing aid in both ears   . Wears partial dentures     Patient Active Problem List   Diagnosis Date Noted  . S/P left rotator cuff repair 05/18/2017  . Non-rheumatic mitral regurgitation   . HOH (hard of hearing) 06/02/2013  . Obesity (BMI 30-39.9) 06/02/2013  . Acute cholecystitis with chronic cholecystitis 06/01/2013  . Calculus of bile duct without mention of cholecystitis or obstruction 05/31/2013  . Nonspecific elevation of levels of transaminase or lactic acid dehydrogenase (LDH) 05/31/2013  . Nonspecific (abnormal) findings on radiological and other examination of biliary tract 05/31/2013  . TOBACCO ABUSE 02/22/2010  . DYSLIPIDEMIA 06/25/2009  . MITRAL INSUFFICIENCY 06/25/2009  . HYPERTENSION 06/25/2009  . Aortic valve disorder 06/25/2009  . VALVULAR HEART DISEASE 06/25/2009  . Cardiovascular disease 06/25/2009  . CEREBROVASCULAR DISEASE 06/25/2009  . CEREBROVASCULAR ACCIDENT WITH RIGHT HEMIPARESIS 06/25/2009  . COPD 06/25/2009  . FOOT SURGERY, HX OF 06/25/2009  . POLYPECTOMY, HX OF 06/25/2009  . TONSILLECTOMY, HX OF 06/25/2009    Past Surgical History:  Procedure Laterality Date  . BACK SURGERY     lumbar back  . CHOLECYSTECTOMY    . ERCP N/A 05/31/2013   Procedure: ENDOSCOPIC RETROGRADE CHOLANGIOPANCREATOGRAPHY (ERCP);  Surgeon: Ladene Artist, MD;  Location: [facility];  Service: Endoscopy;  Laterality: N/A;  . FOOT SURGERY     right  .  LAPAROSCOPIC CHOLECYSTECTOMY SINGLE PORT N/A 06/01/2013   Procedure: LAPAROSCOPIC CHOLECYSTECTOMY SINGLE PORT;  Surgeon: Adin Hector, MD;  Location: [facility];  Service: General;  Laterality: N/A;  . POLYPECTOMY    . SHOULDER ARTHROSCOPY WITH ROTATOR CUFF REPAIR AND SUBACROMIAL DECOMPRESSION Left 05/18/2017  . SHOULDER ARTHROSCOPY WITH ROTATOR CUFF REPAIR AND SUBACROMIAL DECOMPRESSION Left 05/18/2017   Procedure: SHOULDER ARTHROSCOPY WITH ROTATOR CUFF REPAIR AND SUBACROMIAL DECOMPRESSION;  Surgeon:  Tania Ade, MD;  Location: [facility];  Service: Orthopedics;  Laterality: Left;  LEFT SHOULDER ARTHROSCOPY WITH ROTATOR CUFF REPAIR AND SUBACROMIAL DECOMPRESSION  . TEE WITHOUT CARDIOVERSION N/A 05/09/2017   Procedure: TRANSESOPHAGEAL ECHOCARDIOGRAM (TEE);  Surgeon: Skeet Latch, MD;  Location: [facility];  Service: Cardiovascular;  Laterality: N/A;  . TONSILLECTOMY         Home Medications    Prior to Admission medications   Medication Sig Start Date End Date Taking? Authorizing Provider  aspirin EC 81 MG tablet Take 81 mg by mouth daily.      [provider]  aspirin-sod bicarb-citric acid (ALKA-SELTZER) 325 MG TBEF tablet Take 650 mg by mouth daily as needed (indigestion).    [provider]  buPROPion (WELLBUTRIN SR) 150 MG 12 hr tablet Take 150 mg by mouth 2 (two) times daily.  10/07/11   [provider]  calcium carbonate (TUMS - DOSED IN MG ELEMENTAL CALCIUM) 500 MG chewable tablet Chew 2 tablets by mouth daily as needed for indigestion or heartburn.    [provider]  citalopram (CELEXA) 20 MG tablet Take 20 mg by mouth daily before breakfast.  09/19/11   [provider]  docusate sodium (COLACE) 100 MG capsule Take 1 capsule (100 mg total) by mouth 3 (three) times daily as needed. 05/19/17   Grier Mitts, PA-C  EPIPEN 2-PAK 0.3 MG/0.3ML SOAJ injection Inject 0.3 mg into the muscle as needed (allergic reaction). As directed 06/23/14   [provider]  lisinopril (PRINIVIL,ZESTRIL) 20 MG tablet Take 20 mg by mouth at bedtime.  04/14/17   [provider]  lovastatin (MEVACOR) 20 MG tablet Take 1 tablet (20 mg total) by mouth at bedtime. 03/29/17   Fay Records, MD  Multiple Vitamin (MULTIVITAMIN WITH MINERALS) TABS tablet Take 1 tablet by mouth daily.    [provider]  omeprazole (PRILOSEC) 20 MG capsule Take 20 mg by mouth at bedtime.     [provider]  oxyCODONE-acetaminophen (ROXICET) 5-325  MG tablet Take 1-2 tablets by mouth every 4 (four) hours as needed for severe pain. 05/19/17   Grier Mitts, PA-C  Pseudoeph-Doxylamine-DM-APAP (NYQUIL PO) Take 1 Dose by mouth at bedtime as needed (cough).    [provider]  traMADol (ULTRAM) 50 MG tablet Take 50-100 mg by mouth every 6 (six) hours as needed for moderate pain.    [provider]    Family History Family History  Problem Relation Age of Onset  . Stroke Father        No details  . Angina Mother     Social History Social History  Substance Use Topics  . Smoking status: Current Every Day Smoker    Packs/day: 0.50    Years: 57.00    Types: Cigarettes  . Smokeless tobacco: Never Used  . Alcohol use 1.2 - 1.8 oz/week    2 - 3 Glasses of wine per week     Comment: moderate wine     Allergies   Bee venom   Review of Systems Review  of Systems  Constitutional: Negative for fever.  Respiratory: Positive for shortness of breath. Negative for cough and sputum production.   Cardiovascular: Positive for chest pain.  All other systems reviewed and are negative.    Physical Exam Updated Vital Signs BP (!) 123/104 (BP Location: Right Arm)   Pulse (!) 102   Temp 98.6 F (37 C) (Oral)   Resp 16   Ht 6' (1.829 m)   Wt 109.8 kg (242 lb)   SpO2 96%   BMI 32.82 kg/m   Physical Exam  Constitutional: He is oriented to person, place, and time. He appears well-developed and well-nourished. No distress.  HENT:  Head: Normocephalic and atraumatic.  Mouth/Throat: Oropharynx is clear and moist.  Neck: Normal range of motion. Neck supple.  Cardiovascular: Normal rate and regular rhythm.  Exam reveals no friction rub.   No murmur heard. Pulmonary/Chest: Effort normal and breath sounds normal. No respiratory distress. He has no wheezes. He has no rales.  Abdominal: Soft. Bowel sounds are normal. He exhibits no distension. There is no tenderness.  Genitourinary: Rectum normal.  Genitourinary  Comments: There is brown stool present which is faintly heme positive.  Musculoskeletal: Normal range of motion. He exhibits no edema.  Neurological: He is alert and oriented to person, place, and time. Coordination normal.  Skin: Skin is warm and dry. He is not diaphoretic.  Nursing note and vitals reviewed.    ED Treatments / Results  Labs (all labs ordered are listed, but only abnormal results are displayed) Labs Reviewed  CBC - Abnormal; Notable for the following:       Result Value   WBC 13.5 (*)    RBC 2.79 (*)    Hemoglobin 8.6 (*)    HCT 26.6 (*)    All other components within normal limits  COMPREHENSIVE METABOLIC PANEL - Abnormal; Notable for the following:    CO2 21 (*)    Glucose, Bld 102 (*)    BUN 22 (*)    Calcium 8.8 (*)    Total Protein 6.4 (*)    Albumin 3.3 (*)    Alkaline Phosphatase 36 (*)    GFR calc non Af Amer 59 (*)    All other components within normal limits  I-STAT TROPONIN, ED - Abnormal; Notable for the following:    Troponin i, poc 0.10 (*)    All other components within normal limits  BRAIN NATRIURETIC PEPTIDE    EKG  EKG Interpretation  Date/Time:  Thursday July 13 2017 13:39:00 EDT Ventricular Rate:  102 PR Interval:    QRS Duration: 106 QT Interval:  316 QTC Calculation: 412 R Axis:   78 Text Interpretation:  Sinus tachycardia Anterior infarct, old Borderline repolarization abnormality Confirmed by Veryl Speak [phone]) on 07/13/2017 1:41:48 PM       Radiology Dg Chest Portable 1 View  Result Date: 07/13/2017 CLINICAL DATA:  Sob, Hx of aortic insufficiency, heart murmur, hypertension, stroke EXAM: PORTABLE CHEST 1 VIEW COMPARISON:  Chest x-ray dated 08/23/2006. FINDINGS: Mild cardiomegaly, possibly accentuated by slightly lordotic patient positioning. Atherosclerotic changes noted at the aortic arch. Central pulmonary vascular congestion and mild bilateral interstitial edema. No confluent opacity to suggest a developing pneumonia.  No pleural effusion or pneumothorax seen. No acute or suspicious osseous finding. IMPRESSION: 1. Cardiomegaly with central pulmonary vascular congestion and mild bilateral interstitial edema suggesting mild CHF/volume overload. Heart size may be accentuated by lordotic patient positioning. 2. Aortic atherosclerosis. Electronically Signed   By: Franki Cabot  M.D.   On: 07/13/2017 14:15    Procedures Procedures (including critical care time)  Medications Ordered in ED Medications - No data to display   Initial Impression / Assessment and Plan / ED Course  I have reviewed the triage vital signs and the nursing notes.  Pertinent labs & imaging results that were available during my care of the patient were reviewed by me and considered in my medical decision making (see chart for details).  Patient presents with complaints of chest discomfort and dyspnea that occurred earlier this afternoon. His workup reveals an unchanged EKG, however troponin is mildly positive at 0.1. He also has an anemia with hemoglobin of 8.3. Rectal examination was faintly heme positive, however no melena.  Patient was seen by cardiology and plan is to admit the patient to their service for further workup and evaluation.  Final Clinical Impressions(s) / ED Diagnoses   Final diagnoses:  None    New Prescriptions New Prescriptions   No medications on file     Veryl Speak, MD 07/15/17 [phone]

## 2017-07-13 NOTE — ED Triage Notes (Signed)
To ED via [facility] with c/o shortness of breath this am-- increasing this afternoon. On arrival pt is pale, dusky -- pt unable to speak in complete sentences -- lungs diminished throughout.  Had an episode of shortness of breath last night,  Had rotator cuff surgery on 05/18/17-- had a similar episode after surgery.  During episodes pt is diaphoretic, having leg cramps,

## 2017-07-13 NOTE — ED Notes (Signed)
The pt is requesting to go to the br

## 2017-07-13 NOTE — ED Notes (Signed)
Pt attempting to use his urinal after lasix  Became very sob  And tackycardia 155  That lasted for 10-15  Minutes  Prn order for lopressor 2.5 given  Dr Acie Fredrickson called with the pts condition  His reply  That's why we are admittinbg him.  edp at the bedside ordering bi-pap and cardizem

## 2017-07-14 ENCOUNTER — Observation Stay (HOSPITAL_COMMUNITY): Payer: Medicare HMO

## 2017-07-14 ENCOUNTER — Inpatient Hospital Stay (HOSPITAL_COMMUNITY): Payer: Medicare HMO

## 2017-07-14 ENCOUNTER — Encounter (HOSPITAL_COMMUNITY)
Admission: EM | Disposition: A | Discharge status: Rehabilitation Facility | Payer: Self-pay | Source: Home / Self Care | Attending: Cardiovascular Disease

## 2017-07-14 ENCOUNTER — Encounter (HOSPITAL_COMMUNITY): Payer: Self-pay

## 2017-07-14 ENCOUNTER — Observation Stay (HOSPITAL_BASED_OUTPATIENT_CLINIC_OR_DEPARTMENT_OTHER): Payer: Medicare HMO

## 2017-07-14 DIAGNOSIS — Z6831 Body mass index (BMI) 31.0-31.9, adult: Secondary | ICD-10-CM | POA: Diagnosis not present

## 2017-07-14 DIAGNOSIS — J156 Pneumonia due to other aerobic Gram-negative bacteria: Secondary | ICD-10-CM | POA: Diagnosis not present

## 2017-07-14 DIAGNOSIS — E874 Mixed disorder of acid-base balance: Secondary | ICD-10-CM | POA: Diagnosis not present

## 2017-07-14 DIAGNOSIS — K219 Gastro-esophageal reflux disease without esophagitis: Secondary | ICD-10-CM | POA: Diagnosis present

## 2017-07-14 DIAGNOSIS — Z720 Tobacco use: Secondary | ICD-10-CM | POA: Diagnosis not present

## 2017-07-14 DIAGNOSIS — R1312 Dysphagia, oropharyngeal phase: Secondary | ICD-10-CM | POA: Diagnosis not present

## 2017-07-14 DIAGNOSIS — D62 Acute posthemorrhagic anemia: Secondary | ICD-10-CM | POA: Diagnosis not present

## 2017-07-14 DIAGNOSIS — R5383 Other fatigue: Secondary | ICD-10-CM | POA: Diagnosis not present

## 2017-07-14 DIAGNOSIS — I10 Essential (primary) hypertension: Secondary | ICD-10-CM

## 2017-07-14 DIAGNOSIS — R451 Restlessness and agitation: Secondary | ICD-10-CM | POA: Diagnosis not present

## 2017-07-14 DIAGNOSIS — I5033 Acute on chronic diastolic (congestive) heart failure: Secondary | ICD-10-CM | POA: Diagnosis present

## 2017-07-14 DIAGNOSIS — N179 Acute kidney failure, unspecified: Secondary | ICD-10-CM | POA: Diagnosis not present

## 2017-07-14 DIAGNOSIS — Z7982 Long term (current) use of aspirin: Secondary | ICD-10-CM | POA: Diagnosis not present

## 2017-07-14 DIAGNOSIS — I9589 Other hypotension: Secondary | ICD-10-CM

## 2017-07-14 DIAGNOSIS — I350 Nonrheumatic aortic (valve) stenosis: Secondary | ICD-10-CM

## 2017-07-14 DIAGNOSIS — G9340 Encephalopathy, unspecified: Secondary | ICD-10-CM | POA: Diagnosis present

## 2017-07-14 DIAGNOSIS — Z23 Encounter for immunization: Secondary | ICD-10-CM | POA: Diagnosis present

## 2017-07-14 DIAGNOSIS — J441 Chronic obstructive pulmonary disease with (acute) exacerbation: Secondary | ICD-10-CM

## 2017-07-14 DIAGNOSIS — J440 Chronic obstructive pulmonary disease with acute lower respiratory infection: Secondary | ICD-10-CM | POA: Diagnosis not present

## 2017-07-14 DIAGNOSIS — F329 Major depressive disorder, single episode, unspecified: Secondary | ICD-10-CM | POA: Diagnosis present

## 2017-07-14 DIAGNOSIS — I471 Supraventricular tachycardia: Secondary | ICD-10-CM | POA: Diagnosis not present

## 2017-07-14 DIAGNOSIS — J810 Acute pulmonary edema: Secondary | ICD-10-CM | POA: Diagnosis not present

## 2017-07-14 DIAGNOSIS — R0603 Acute respiratory distress: Secondary | ICD-10-CM

## 2017-07-14 DIAGNOSIS — G92 Toxic encephalopathy: Secondary | ICD-10-CM | POA: Diagnosis not present

## 2017-07-14 DIAGNOSIS — I959 Hypotension, unspecified: Secondary | ICD-10-CM

## 2017-07-14 DIAGNOSIS — M545 Low back pain: Secondary | ICD-10-CM | POA: Diagnosis present

## 2017-07-14 DIAGNOSIS — I69391 Dysphagia following cerebral infarction: Secondary | ICD-10-CM | POA: Diagnosis not present

## 2017-07-14 DIAGNOSIS — R410 Disorientation, unspecified: Secondary | ICD-10-CM | POA: Diagnosis not present

## 2017-07-14 DIAGNOSIS — R5381 Other malaise: Secondary | ICD-10-CM | POA: Diagnosis present

## 2017-07-14 DIAGNOSIS — E785 Hyperlipidemia, unspecified: Secondary | ICD-10-CM | POA: Diagnosis present

## 2017-07-14 DIAGNOSIS — K7200 Acute and subacute hepatic failure without coma: Secondary | ICD-10-CM | POA: Diagnosis not present

## 2017-07-14 DIAGNOSIS — I130 Hypertensive heart and chronic kidney disease with heart failure and stage 1 through stage 4 chronic kidney disease, or unspecified chronic kidney disease: Secondary | ICD-10-CM | POA: Diagnosis present

## 2017-07-14 DIAGNOSIS — I4891 Unspecified atrial fibrillation: Secondary | ICD-10-CM | POA: Diagnosis present

## 2017-07-14 DIAGNOSIS — G9341 Metabolic encephalopathy: Secondary | ICD-10-CM | POA: Diagnosis not present

## 2017-07-14 DIAGNOSIS — I479 Paroxysmal tachycardia, unspecified: Secondary | ICD-10-CM | POA: Diagnosis not present

## 2017-07-14 DIAGNOSIS — Z7189 Other specified counseling: Secondary | ICD-10-CM | POA: Diagnosis not present

## 2017-07-14 DIAGNOSIS — E872 Acidosis: Secondary | ICD-10-CM | POA: Diagnosis not present

## 2017-07-14 DIAGNOSIS — F17210 Nicotine dependence, cigarettes, uncomplicated: Secondary | ICD-10-CM | POA: Diagnosis present

## 2017-07-14 DIAGNOSIS — J449 Chronic obstructive pulmonary disease, unspecified: Secondary | ICD-10-CM | POA: Diagnosis present

## 2017-07-14 DIAGNOSIS — F5102 Adjustment insomnia: Secondary | ICD-10-CM | POA: Diagnosis not present

## 2017-07-14 DIAGNOSIS — I509 Heart failure, unspecified: Secondary | ICD-10-CM | POA: Diagnosis not present

## 2017-07-14 DIAGNOSIS — Z9889 Other specified postprocedural states: Secondary | ICD-10-CM | POA: Diagnosis not present

## 2017-07-14 DIAGNOSIS — R6521 Severe sepsis with septic shock: Secondary | ICD-10-CM | POA: Diagnosis not present

## 2017-07-14 DIAGNOSIS — I110 Hypertensive heart disease with heart failure: Secondary | ICD-10-CM | POA: Diagnosis present

## 2017-07-14 DIAGNOSIS — A419 Sepsis, unspecified organism: Secondary | ICD-10-CM | POA: Diagnosis not present

## 2017-07-14 DIAGNOSIS — I69331 Monoplegia of upper limb following cerebral infarction affecting right dominant side: Secondary | ICD-10-CM | POA: Diagnosis present

## 2017-07-14 DIAGNOSIS — R1310 Dysphagia, unspecified: Secondary | ICD-10-CM | POA: Diagnosis present

## 2017-07-14 DIAGNOSIS — M109 Gout, unspecified: Secondary | ICD-10-CM | POA: Diagnosis not present

## 2017-07-14 DIAGNOSIS — G8929 Other chronic pain: Secondary | ICD-10-CM | POA: Diagnosis present

## 2017-07-14 DIAGNOSIS — E875 Hyperkalemia: Secondary | ICD-10-CM | POA: Diagnosis not present

## 2017-07-14 DIAGNOSIS — I481 Persistent atrial fibrillation: Secondary | ICD-10-CM | POA: Diagnosis not present

## 2017-07-14 DIAGNOSIS — Z713 Dietary counseling and surveillance: Secondary | ICD-10-CM | POA: Diagnosis not present

## 2017-07-14 DIAGNOSIS — D638 Anemia in other chronic diseases classified elsewhere: Secondary | ICD-10-CM | POA: Diagnosis not present

## 2017-07-14 DIAGNOSIS — J9601 Acute respiratory failure with hypoxia: Secondary | ICD-10-CM | POA: Diagnosis present

## 2017-07-14 DIAGNOSIS — I340 Nonrheumatic mitral (valve) insufficiency: Secondary | ICD-10-CM | POA: Diagnosis not present

## 2017-07-14 DIAGNOSIS — Z4789 Encounter for other orthopedic aftercare: Secondary | ICD-10-CM | POA: Diagnosis not present

## 2017-07-14 DIAGNOSIS — Z9049 Acquired absence of other specified parts of digestive tract: Secondary | ICD-10-CM | POA: Diagnosis not present

## 2017-07-14 DIAGNOSIS — E870 Hyperosmolality and hypernatremia: Secondary | ICD-10-CM | POA: Diagnosis not present

## 2017-07-14 DIAGNOSIS — G8220 Paraplegia, unspecified: Secondary | ICD-10-CM | POA: Diagnosis not present

## 2017-07-14 DIAGNOSIS — Z8673 Personal history of transient ischemic attack (TIA), and cerebral infarction without residual deficits: Secondary | ICD-10-CM | POA: Diagnosis not present

## 2017-07-14 DIAGNOSIS — E669 Obesity, unspecified: Secondary | ICD-10-CM | POA: Diagnosis present

## 2017-07-14 DIAGNOSIS — Z9911 Dependence on respirator [ventilator] status: Secondary | ICD-10-CM | POA: Diagnosis not present

## 2017-07-14 DIAGNOSIS — G4733 Obstructive sleep apnea (adult) (pediatric): Secondary | ICD-10-CM | POA: Diagnosis present

## 2017-07-14 DIAGNOSIS — N170 Acute kidney failure with tubular necrosis: Secondary | ICD-10-CM | POA: Diagnosis not present

## 2017-07-14 DIAGNOSIS — R0600 Dyspnea, unspecified: Secondary | ICD-10-CM | POA: Diagnosis not present

## 2017-07-14 DIAGNOSIS — R0602 Shortness of breath: Secondary | ICD-10-CM | POA: Diagnosis present

## 2017-07-14 DIAGNOSIS — M7989 Other specified soft tissue disorders: Secondary | ICD-10-CM | POA: Diagnosis not present

## 2017-07-14 DIAGNOSIS — N183 Chronic kidney disease, stage 3 (moderate): Secondary | ICD-10-CM | POA: Diagnosis present

## 2017-07-14 DIAGNOSIS — I5031 Acute diastolic (congestive) heart failure: Secondary | ICD-10-CM | POA: Diagnosis not present

## 2017-07-14 DIAGNOSIS — Z515 Encounter for palliative care: Secondary | ICD-10-CM | POA: Diagnosis not present

## 2017-07-14 DIAGNOSIS — M6281 Muscle weakness (generalized): Secondary | ICD-10-CM | POA: Diagnosis not present

## 2017-07-14 DIAGNOSIS — I480 Paroxysmal atrial fibrillation: Secondary | ICD-10-CM

## 2017-07-14 DIAGNOSIS — D631 Anemia in chronic kidney disease: Secondary | ICD-10-CM | POA: Diagnosis present

## 2017-07-14 DIAGNOSIS — R29898 Other symptoms and signs involving the musculoskeletal system: Secondary | ICD-10-CM | POA: Diagnosis not present

## 2017-07-14 DIAGNOSIS — R570 Cardiogenic shock: Secondary | ICD-10-CM | POA: Diagnosis not present

## 2017-07-14 LAB — POCT I-STAT 3, ART BLOOD GAS (G3+)
ACID-BASE DEFICIT: 11 mmol/L — AB (ref 0.0–2.0)
Acid-base deficit: 15 mmol/L — ABNORMAL HIGH (ref 0.0–2.0)
Bicarbonate: 13.3 mmol/L — ABNORMAL LOW (ref 20.0–28.0)
Bicarbonate: 13.2 mmol/L — ABNORMAL LOW (ref 20.0–28.0)
O2 SAT: 99 %
O2 Saturation: 100 %
PCO2 ART: 38.5 mmHg (ref 32.0–48.0)
PH ART: 7.144 — AB (ref 7.350–7.450)
PO2 ART: 156 mmHg — AB (ref 83.0–108.0)
TCO2: 14 mmol/L (ref 0–100)
TCO2: 14 mmol/L (ref 0–100)
pCO2 arterial: 23.6 mmHg — ABNORMAL LOW (ref 32.0–48.0)
pH, Arterial: 7.358 (ref 7.350–7.450)
pO2, Arterial: 343 mmHg — ABNORMAL HIGH (ref 83.0–108.0)

## 2017-07-14 LAB — TROPONIN I
TROPONIN I: 0.11 ng/mL — AB (ref <0.03)
Troponin I: 0.1 ng/mL (ref <0.03)
Troponin I: 0.07 ng/mL (ref <0.03)

## 2017-07-14 LAB — ECHOCARDIOGRAM COMPLETE
AO mean calculated velocity dopler: 261 cm/s
AV Area VTI index: 0.36 cm2/m2
AV Mean grad: 33 mmHg
AV Peak grad: 72 mmHg
AV peak Index: 0.39
AV pk vel: 425 cm/s
AV vel: 0.85
AVAREAMEANV: 1.11 cm2
AVAREAMEANVIN: 0.48 cm2/m2
AVAREAVTI: 0.9 cm2
AVCELMEANRAT: 0.35
AVLVOTPG: 6 mmHg
Ao pk vel: 0.29 m/s
Area-P 1/2: 2.22 cm2
CHL CUP LVOT MV VTI: 1.91
CHL CUP MV M VEL: 84
EERAT: 20.63
EWDT: 359 ms
FS: 20 % — AB (ref 28–44)
HEIGHTINCHES: 72 in
IV/PV OW: 1.08
LA ID, A-P, ES: 49 mm
LA diam index: 2.1 cm/m2
LA vol A4C: 64.1 ml
LAVOL: 73.3 mL
LAVOLIN: 31.5 mL/m2
LEFT ATRIUM END SYS DIAM: 49 mm
LV E/e' medial: 20.63
LV E/e'average: 20.63
LV PW d: 13 mm — AB (ref 0.6–1.1)
LV e' LATERAL: 6.3 cm/s
LVOT MV VTI INDEX: 0.82 cm2/m2
LVOT VTI: 27.2 cm
LVOT area: 3.14 cm2
LVOT diameter: 20 mm
LVOT peak vel: 122 cm/s
LVOTSV: 85 mL
LVOTVTI: 0.27 cm
Lateral S' vel: 9.28 cm/s
MRPISAEROA: 0.14 cm2
MV Annulus VTI: 44.7 cm
MV Dec: 359
MV Peak grad: 7 mmHg
MV VTI: 135 cm
MV pk E vel: 130 m/s
MVG: 4 mmHg
MVPKAVEL: 78.8 m/s
P 1/2 time: 379 ms
P 1/2 time: 99 ms
TAPSE: 22.5 mm
TDI e' lateral: 6.3
TDI e' medial: 5.44
VTI: 100 cm
Valve area index: 0.36
Valve area: 0.85 cm2
WEIGHTICAEL: 3968.28 [oz_av]

## 2017-07-14 LAB — COMPREHENSIVE METABOLIC PANEL
ALBUMIN: 3 g/dL — AB (ref 3.5–5.0)
ALK PHOS: 60 U/L (ref 38–126)
ALT: 2416 U/L — ABNORMAL HIGH (ref 17–63)
AST: 3930 U/L — AB (ref 15–41)
Anion gap: 14 (ref 5–15)
BUN: 44 mg/dL — AB (ref 6–20)
CALCIUM: 7.9 mg/dL — AB (ref 8.9–10.3)
CO2: 14 mmol/L — ABNORMAL LOW (ref 22–32)
Chloride: 112 mmol/L — ABNORMAL HIGH (ref 101–111)
Creatinine, Ser: 2.81 mg/dL — ABNORMAL HIGH (ref 0.61–1.24)
GFR calc Af Amer: 24 mL/min — ABNORMAL LOW (ref >60)
GFR calc non Af Amer: 21 mL/min — ABNORMAL LOW (ref >60)
GLUCOSE: 134 mg/dL — AB (ref 65–99)
Potassium: 6.2 mmol/L — ABNORMAL HIGH (ref 3.5–5.1)
Sodium: 140 mmol/L (ref 135–145)
TOTAL PROTEIN: 5.8 g/dL — AB (ref 6.5–8.1)
Total Bilirubin: 1.5 mg/dL — ABNORMAL HIGH (ref 0.3–1.2)

## 2017-07-14 LAB — MAGNESIUM: Magnesium: 2.2 mg/dL (ref 1.7–2.4)

## 2017-07-14 LAB — CBC WITH DIFFERENTIAL/PLATELET
BASOS PCT: 0 %
Basophils Absolute: 0 10*3/uL (ref 0.0–0.1)
EOS PCT: 0 %
Eosinophils Absolute: 0 10*3/uL (ref 0.0–0.7)
HCT: 27.5 % — ABNORMAL LOW (ref 39.0–52.0)
Hemoglobin: 8.5 g/dL — ABNORMAL LOW (ref 13.0–17.0)
LYMPHS PCT: 6 %
Lymphs Abs: 0.8 10*3/uL (ref 0.7–4.0)
MCH: 29.6 pg (ref 26.0–34.0)
MCHC: 30.9 g/dL (ref 30.0–36.0)
MCV: 95.8 fL (ref 78.0–100.0)
MONO ABS: 0.4 10*3/uL (ref 0.1–1.0)
Monocytes Relative: 3 %
Neutro Abs: 12.9 10*3/uL — ABNORMAL HIGH (ref 1.7–7.7)
Neutrophils Relative %: 91 %
PLATELETS: 274 10*3/uL (ref 150–400)
RBC: 2.87 MIL/uL — ABNORMAL LOW (ref 4.22–5.81)
RDW: 14.7 % (ref 11.5–15.5)
WBC: 14.1 10*3/uL — ABNORMAL HIGH (ref 4.0–10.5)

## 2017-07-14 LAB — LACTIC ACID, PLASMA
LACTIC ACID, VENOUS: 5.2 mmol/L — AB (ref 0.5–1.9)
LACTIC ACID, VENOUS: 5.8 mmol/L — AB (ref 0.5–1.9)

## 2017-07-14 LAB — MRSA PCR SCREENING: MRSA BY PCR: NEGATIVE

## 2017-07-14 LAB — CBC
HEMATOCRIT: 26 % — AB (ref 39.0–52.0)
Hemoglobin: 8.2 g/dL — ABNORMAL LOW (ref 13.0–17.0)
MCH: 29.8 pg (ref 26.0–34.0)
MCHC: 31.5 g/dL (ref 30.0–36.0)
MCV: 94.5 fL (ref 78.0–100.0)
PLATELETS: 334 10*3/uL (ref 150–400)
RBC: 2.75 MIL/uL — ABNORMAL LOW (ref 4.22–5.81)
RDW: 14.4 % (ref 11.5–15.5)
WBC: 12.7 10*3/uL — ABNORMAL HIGH (ref 4.0–10.5)

## 2017-07-14 LAB — D-DIMER, QUANTITATIVE (NOT AT ARMC): D DIMER QUANT: 2.14 ug{FEU}/mL — AB (ref 0.00–0.50)

## 2017-07-14 LAB — BASIC METABOLIC PANEL
Anion gap: 10 (ref 5–15)
BUN: 26 mg/dL — AB (ref 6–20)
CALCIUM: 8.5 mg/dL — AB (ref 8.9–10.3)
CO2: 19 mmol/L — ABNORMAL LOW (ref 22–32)
CREATININE: 1.52 mg/dL — AB (ref 0.61–1.24)
Chloride: 110 mmol/L (ref 101–111)
GFR calc Af Amer: 51 mL/min — ABNORMAL LOW (ref >60)
GFR, EST NON AFRICAN AMERICAN: 44 mL/min — AB (ref >60)
GLUCOSE: 143 mg/dL — AB (ref 65–99)
POTASSIUM: 4.6 mmol/L (ref 3.5–5.1)
Sodium: 139 mmol/L (ref 135–145)

## 2017-07-14 LAB — PROTIME-INR
INR: 1.29
Prothrombin Time: 16.2 seconds — ABNORMAL HIGH (ref 11.4–15.2)

## 2017-07-14 SURGERY — RIGHT/LEFT HEART CATH AND CORONARY ANGIOGRAPHY
Anesthesia: LOCAL

## 2017-07-14 MED ORDER — BUDESONIDE 0.5 MG/2ML IN SUSP: 0.5000 mg | Freq: Two times a day (BID) | RESPIRATORY_TRACT | Status: DC

## 2017-07-14 MED ORDER — IPRATROPIUM-ALBUTEROL 0.5-2.5 (3) MG/3ML IN SOLN
RESPIRATORY_TRACT | Status: AC
  Administered 2017-07-14: 14:00:00
  Filled 2017-07-14: qty 3

## 2017-07-14 MED ORDER — VANCOMYCIN HCL IN DEXTROSE 750-5 MG/150ML-% IV SOLN
750.0000 mg | Freq: Two times a day (BID) | INTRAVENOUS | Status: DC
  Administered 2017-07-15: 750 mg via INTRAVENOUS
  Filled 2017-07-14 (×2): qty 150

## 2017-07-14 MED ORDER — DEXMEDETOMIDINE HCL IN NACL 400 MCG/100ML IV SOLN
0.4000 ug/kg/h | INTRAVENOUS | Status: DC
  Administered 2017-07-14: 0.7 ug/kg/h via INTRAVENOUS
  Administered 2017-07-14: 0.4 ug/kg/h via INTRAVENOUS
  Administered 2017-07-14: 0.8 ug/kg/h via INTRAVENOUS
  Filled 2017-07-14 (×3): qty 100

## 2017-07-14 MED ORDER — METHYLPREDNISOLONE SODIUM SUCC 125 MG IJ SOLR
60.0000 mg | Freq: Two times a day (BID) | INTRAMUSCULAR | Status: DC
  Administered 2017-07-14 – 2017-07-16 (×4): 60 mg via INTRAVENOUS
  Filled 2017-07-14 (×4): qty 2

## 2017-07-14 MED ORDER — PERFLUTREN LIPID MICROSPHERE
1.0000 mL | INTRAVENOUS | Status: AC | PRN
  Administered 2017-07-14: 2 mL via INTRAVENOUS
  Filled 2017-07-14: qty 10

## 2017-07-14 MED ORDER — FUROSEMIDE 10 MG/ML IJ SOLN
80.0000 mg | Freq: Once | INTRAMUSCULAR | Status: AC
  Administered 2017-07-14: 80 mg via INTRAVENOUS

## 2017-07-14 MED ORDER — HALOPERIDOL LACTATE 5 MG/ML IJ SOLN
INTRAMUSCULAR | Status: AC
  Filled 2017-07-14: qty 1

## 2017-07-14 MED ORDER — ARFORMOTEROL TARTRATE 15 MCG/2ML IN NEBU: 15.0000 ug | INHALATION_SOLUTION | Freq: Two times a day (BID) | RESPIRATORY_TRACT | Status: DC

## 2017-07-14 MED ORDER — HALOPERIDOL LACTATE 5 MG/ML IJ SOLN
2.0000 mg | INTRAMUSCULAR | Status: DC | PRN
  Administered 2017-07-14: 2 mg via INTRAVENOUS
  Filled 2017-07-14: qty 1

## 2017-07-14 MED ORDER — SODIUM CHLORIDE 0.9 % IV SOLN
0.0000 ug/min | INTRAVENOUS | Status: DC
  Administered 2017-07-14: 20 ug/min via INTRAVENOUS
  Administered 2017-07-15 (×2): 30 ug/min via INTRAVENOUS
  Filled 2017-07-14 (×3): qty 1

## 2017-07-14 MED ORDER — SODIUM BICARBONATE 8.4 % IV SOLN
25.0000 meq | Freq: Once | INTRAVENOUS | Status: AC
  Administered 2017-07-14: 25 meq via INTRAVENOUS
  Filled 2017-07-14: qty 50

## 2017-07-14 MED ORDER — SODIUM POLYSTYRENE SULFONATE 15 GM/60ML PO SUSP
60.0000 g | Freq: Once | ORAL | Status: AC
  Administered 2017-07-15: 60 g
  Filled 2017-07-14: qty 240

## 2017-07-14 MED ORDER — ETOMIDATE 2 MG/ML IV SOLN
0.3000 mg/kg | Freq: Once | INTRAVENOUS | Status: AC
  Administered 2017-07-14: 33.76 mg via INTRAVENOUS

## 2017-07-14 MED ORDER — CHLORHEXIDINE GLUCONATE 0.12 % MT SOLN
15.0000 mL | Freq: Two times a day (BID) | OROMUCOSAL | Status: DC
  Administered 2017-07-14 – 2017-07-15 (×3): 15 mL via OROMUCOSAL
  Filled 2017-07-14 (×2): qty 15

## 2017-07-14 MED ORDER — SODIUM CHLORIDE 0.9 % IV BOLUS (SEPSIS)
500.0000 mL | Freq: Once | INTRAVENOUS | Status: DC
Start: 2017-07-14 — End: 2017-07-14

## 2017-07-14 MED ORDER — PIPERACILLIN-TAZOBACTAM 3.375 G IVPB
3.3750 g | Freq: Three times a day (TID) | INTRAVENOUS | Status: DC
  Administered 2017-07-14 – 2017-07-17 (×8): 3.375 g via INTRAVENOUS
  Filled 2017-07-14 (×10): qty 50

## 2017-07-14 MED ORDER — SODIUM BICARBONATE 8.4 % IV SOLN
INTRAVENOUS | Status: AC
  Administered 2017-07-14: 50 meq
  Filled 2017-07-14: qty 50

## 2017-07-14 MED ORDER — SODIUM CHLORIDE 0.9 % IV SOLN: INTRAVENOUS | Status: DC

## 2017-07-14 MED ORDER — ATROPINE SULFATE 1 MG/10ML IJ SOSY
PREFILLED_SYRINGE | INTRAMUSCULAR | Status: AC
  Filled 2017-07-14: qty 10

## 2017-07-14 MED ORDER — MIDAZOLAM HCL 2 MG/2ML IJ SOLN
4.0000 mg | Freq: Once | INTRAMUSCULAR | Status: AC
  Administered 2017-07-14: 4 mg via INTRAVENOUS

## 2017-07-14 MED ORDER — METOPROLOL TARTRATE 12.5 MG HALF TABLET: 12.5000 mg | ORAL_TABLET | Freq: Two times a day (BID) | ORAL | Status: DC

## 2017-07-14 MED ORDER — PANTOPRAZOLE SODIUM 40 MG IV SOLR
40.0000 mg | INTRAVENOUS | Status: DC
  Administered 2017-07-14 – 2017-07-19 (×6): 40 mg via INTRAVENOUS
  Filled 2017-07-14 (×6): qty 40

## 2017-07-14 MED ORDER — BUDESONIDE 0.5 MG/2ML IN SUSP
0.5000 mg | Freq: Two times a day (BID) | RESPIRATORY_TRACT | Status: DC
  Administered 2017-07-14 – 2017-07-25 (×22): 0.5 mg via RESPIRATORY_TRACT
  Filled 2017-07-14 (×22): qty 2

## 2017-07-14 MED ORDER — IPRATROPIUM-ALBUTEROL 0.5-2.5 (3) MG/3ML IN SOLN
3.0000 mL | Freq: Four times a day (QID) | RESPIRATORY_TRACT | Status: DC
  Administered 2017-07-14 – 2017-07-19 (×19): 3 mL via RESPIRATORY_TRACT
  Filled 2017-07-14 (×18): qty 3

## 2017-07-14 MED ORDER — LACTATED RINGERS IV BOLUS (SEPSIS)
1000.0000 mL | Freq: Once | INTRAVENOUS | Status: AC
  Administered 2017-07-14: 1000 mL via INTRAVENOUS

## 2017-07-14 MED ORDER — CHLORHEXIDINE GLUCONATE 0.12 % MT SOLN
15.0000 mL | Freq: Two times a day (BID) | OROMUCOSAL | Status: DC
  Administered 2017-07-14: 15 mL via OROMUCOSAL

## 2017-07-14 MED ORDER — MIDAZOLAM HCL 2 MG/2ML IJ SOLN
1.0000 mg | INTRAMUSCULAR | Status: DC | PRN
  Administered 2017-07-14 – 2017-07-15 (×6): 1 mg via INTRAVENOUS
  Filled 2017-07-14 (×4): qty 2

## 2017-07-14 MED ORDER — FENTANYL CITRATE (PF) 100 MCG/2ML IJ SOLN
INTRAMUSCULAR | Status: AC
  Filled 2017-07-14: qty 2

## 2017-07-14 MED ORDER — SODIUM CHLORIDE 0.9 % IV BOLUS (SEPSIS)
500.0000 mL | Freq: Once | INTRAVENOUS | Status: AC
  Administered 2017-07-14: 500 mL via INTRAVENOUS

## 2017-07-14 MED ORDER — SODIUM CHLORIDE 0.9 % IV BOLUS (SEPSIS)
500.0000 mL | Freq: Once | INTRAVENOUS | Status: AC
Start: 2017-07-14 — End: 2017-07-14
  Administered 2017-07-14: 500 mL via INTRAVENOUS

## 2017-07-14 MED ORDER — FENTANYL CITRATE (PF) 100 MCG/2ML IJ SOLN
100.0000 ug | Freq: Once | INTRAMUSCULAR | Status: AC
  Administered 2017-07-14: 100 ug via INTRAVENOUS

## 2017-07-14 MED ORDER — MIDAZOLAM HCL 2 MG/2ML IJ SOLN
4.0000 mg | Freq: Once | INTRAMUSCULAR | Status: DC
  Filled 2017-07-14: qty 4

## 2017-07-14 MED ORDER — CHLORHEXIDINE GLUCONATE 0.12 % MT SOLN: 15.0000 mL | Freq: Two times a day (BID) | OROMUCOSAL | Status: DC

## 2017-07-14 MED ORDER — VANCOMYCIN HCL 10 G IV SOLR
2000.0000 mg | Freq: Once | INTRAVENOUS | Status: AC
  Administered 2017-07-14: 2000 mg via INTRAVENOUS
  Filled 2017-07-14: qty 2000

## 2017-07-14 MED ORDER — ROCURONIUM BROMIDE 50 MG/5ML IV SOLN
100.0000 mg | Freq: Once | INTRAVENOUS | Status: AC
  Administered 2017-07-14: 100 mg via INTRAVENOUS

## 2017-07-14 MED ORDER — SODIUM CHLORIDE 0.9 % IV SOLN
0.4000 ug/kg/h | INTRAVENOUS | Status: DC
  Administered 2017-07-14: 1.8 ug/kg/h via INTRAVENOUS
  Filled 2017-07-14 (×2): qty 2

## 2017-07-14 MED ORDER — FENTANYL CITRATE (PF) 100 MCG/2ML IJ SOLN
INTRAMUSCULAR | Status: AC
  Administered 2017-07-14: 100 ug
  Filled 2017-07-14: qty 2

## 2017-07-14 MED ORDER — ARFORMOTEROL TARTRATE 15 MCG/2ML IN NEBU
15.0000 ug | INHALATION_SOLUTION | Freq: Two times a day (BID) | RESPIRATORY_TRACT | Status: DC
  Administered 2017-07-14 – 2017-07-24 (×20): 15 ug via RESPIRATORY_TRACT
  Filled 2017-07-14 (×20): qty 2

## 2017-07-14 MED ORDER — LORAZEPAM 2 MG/ML IJ SOLN
1.0000 mg | INTRAMUSCULAR | Status: DC | PRN
  Administered 2017-07-14 (×2): 1 mg via INTRAVENOUS
  Filled 2017-07-14 (×2): qty 1

## 2017-07-14 MED ORDER — DOPAMINE-DEXTROSE 3.2-5 MG/ML-% IV SOLN
5.0000 ug/kg/min | INTRAVENOUS | Status: DC
  Administered 2017-07-14: 5 ug/kg/min via INTRAVENOUS
  Filled 2017-07-14: qty 250

## 2017-07-14 MED ORDER — HALOPERIDOL LACTATE 5 MG/ML IJ SOLN
5.0000 mg | Freq: Once | INTRAMUSCULAR | Status: AC
  Administered 2017-07-14: 5 mg via INTRAVENOUS

## 2017-07-14 MED ORDER — ORAL CARE MOUTH RINSE
15.0000 mL | Freq: Two times a day (BID) | OROMUCOSAL | Status: DC
  Administered 2017-07-14 – 2017-07-15 (×4): 15 mL via OROMUCOSAL

## 2017-07-14 MED ORDER — FENTANYL 2500MCG IN NS 250ML (10MCG/ML) PREMIX INFUSION
100.0000 ug/h | INTRAVENOUS | Status: DC
  Administered 2017-07-14: 50 ug/h via INTRAVENOUS
  Filled 2017-07-14 (×2): qty 250

## 2017-07-14 MED ORDER — PERFLUTREN LIPID MICROSPHERE
INTRAVENOUS | Status: AC
  Filled 2017-07-14: qty 10

## 2017-07-14 MED ORDER — MIDAZOLAM HCL 2 MG/2ML IJ SOLN
INTRAMUSCULAR | Status: AC
  Filled 2017-07-14: qty 4

## 2017-07-14 NOTE — Procedures (Signed)
Central Venous Catheter Insertion Procedure Note Brandon [name] [id number] October 20, 1946  Procedure: Insertion of Central Venous Catheter Indications: Assessment of intravascular volume, Drug and/or fluid administration and Frequent blood sampling  Procedure Details Consent: Risks of procedure as well as the alternatives and risks of each were explained to the (patient/caregiver).  Consent for procedure obtained. and emergent cricumstances Time Out: Verified patient identification, verified procedure, site/side was marked, verified correct patient position, special equipment/implants available, medications/allergies/relevent history reviewed, required imaging and test results available.  Performed  Maximum sterile technique was used including antiseptics, cap, gloves, gown, hand hygiene, mask and sheet. Skin prep: Chlorhexidine; local anesthetic administered A antimicrobial bonded/coated triple lumen catheter was placed in the right internal jugular vein using the Seldinger technique.  Evaluation Blood flow good Complications: No apparent complications Patient did tolerate procedure well. Chest X-ray ordered to verify placement.  CXR: normal.  Brandon [name] Brandon [name] 07/14/2017, 10:11 PM

## 2017-07-14 NOTE — Progress Notes (Signed)
  Echocardiogram 2D Echocardiogram has been performed.  Samul Mcinroy 07/14/2017, 5:52 PM

## 2017-07-14 NOTE — Progress Notes (Signed)
RN walked by patient room and heard the Bi-Pap machine ringing. RN saw patient had taken mask off, and had gotten out of bed without calling out.   When RN went in the room to help the patient, he was very confused, agitated, and combative.   Five other RNs came in the patient's room to help get him back in bed and hold him while soft wrist and ankle restraints were placed.   E-Link notified, and MD saw patient and ordered Haldol.   Cards fellow on call notified and orders to start precedex were received.   Patient is safe and back in bed weith Bi-Pap on. Vital signs are stable. Patient continues to be confused and agitated when he wakes up.   Will continue to monitor.   Tafari Humiston E Reola Mosher, [place]

## 2017-07-14 NOTE — Progress Notes (Signed)
eLink Physician-Brief Progress Note Patient Name: Brandon [name] DOB: [birth year] MRN: [id number]   Date of Service  07/14/2017  HPI/Events of Note  Hyperkalemia with K level of 6.2.  eICU Interventions  Kayexalate 60 gm per tube x one.     Intervention Category Major Interventions: Electrolyte abnormality - evaluation and management  DETERDING,ELIZABETH 07/14/2017, 11:13 PM

## 2017-07-14 NOTE — Progress Notes (Signed)
E-link called. Updated of pt condition. Informed that pt continues to flail limbs and is extremely agitated even after all PRN anxiety medications given. Pt now Sinus tach in the 110s. New order received

## 2017-07-14 NOTE — Progress Notes (Signed)
Pharmacy Antibiotic Note  DON TIU is a 71 y.o. male admitted on 07/13/2017 with sepsis  Plan: Zosyn 3.375 gm q8 Vanc 2 g x 1 then 750 q12  Height: 6' (182.9 cm) Weight: 248 lb 0.3 oz (112.5 kg) IBW/kg (Calculated) : 77.6  Temp (24hrs), Avg:97.4 F (36.3 C), Min:96.4 F (35.8 C), Max:98.6 F (37 C)   Recent Labs Lab 07/13/17 1341 07/14/17 0322 07/14/17 1537  WBC 13.5* 12.7*  --   CREATININE 1.20 1.52*  --   LATICACIDVEN  --   --  5.2*    Estimated Creatinine Clearance: 57.8 mL/min (A) (by C-G formula based on SCr of 1.52 mg/dL (H)).    Allergies  Allergen Reactions  . Bee Venom Anaphylaxis and Swelling   Levester Fresh, PharmD, BCPS, BCCCP Clinical Pharmacist Clinical phone for 07/14/2017 from 7a-3:30p: [phone] If after 3:30p, please call main pharmacy at: [phone] 07/14/2017 6:58 PM

## 2017-07-14 NOTE — Progress Notes (Signed)
[facility] Progress Note Patient Name: Brandon [name] DOB: 1946/01/24 MRN: [id number]   Date of Service  07/14/2017  HPI/Events of Note  Best Practice  eICU Interventions  PPI for suppressive therapy while on vent for stress ulcer propy     Intervention Category Intermediate Interventions: Best-practice therapies (e.g. DVT, beta blocker, etc.)  DETERDING,ELIZABETH 07/14/2017, 9:35 PM

## 2017-07-14 NOTE — Consult Note (Signed)
PULMONARY / CRITICAL CARE MEDICINE   Name: Brandon [name] MRN: [id number] DOB: 1946/07/17    ADMISSION DATE:  07/13/2017 CONSULTATION DATE:  07/14/17 REFERRING MD:  Dr. Acie Fredrickson   CHIEF COMPLAINT:  Respiratory distress  HISTORY OF PRESENT ILLNESS:   71 y/o M who presented to [facility] on 8/2 with shortness of breath via [facility].  On arrival to the ER, he was unable to speak complete sentences and was diaphoretic.  He recently had rotator cuff surgery 05/18/17 and reported he had similar episodes after surgery.  While in ER, during periods of respiratory distress he was noted to have AF with RVR.  He was treated with IV lasix and Lopressor for rate control. The patient was started on a cardizem gtt and admitted by  Cardiology for evaluation of AFwRVR and pulmonary edema.  Overnight 8/2, he developed significant agitation and was given xanax 0.25 mg, haldol 5mg  + ativan 1 mg.  Ultimately, the patient was started on precedex for agitation. The patient was maintained on BiPAP for work of breathing.  Cardiology concerned that some of his symptoms are related to CAD and LHC was recommended.  Unfortunately, the patients sr cr rose from 1.2 to 1.52 on 8/3 limiting a LHC (baseline on ACE-I).  He developed hypotension on precedex and UOP decreased.  500 ml NS bolus attempted with no increase in UOP.  PCCM consulted for evaluation of SOB.    Wife reports pt has smoked since age 35, heaviest up to 3ppd, currently 1ppd.  He worked for [employer].  She denies known sick contacts, fevers, chills, nausea/vomiting, swelling of lower extremities, chest pain.  Pt has had intermittent periods of shortness of breath since his rotator cuff surgery.  She does note he occasionally gets choked on foods.  No recent travel, no exotic animals. She reports he continues to smoke and drinks a large tumbler full of alcohol / juice 4x per week while in the hot tub - never has had the shakes or withdrawal symptoms in the past.   PAST MEDICAL  HISTORY :  He  has a past medical history of Anemia (07/13/2017); Anxiety; Aortic insufficiency; Aortic stenosis, moderate (07/13/2017); Arthritis; Carotid stenosis; Depression; Dyslipidemia; GERD (gastroesophageal reflux disease); Heart murmur; Hemiplegia affecting unspecified side, late effect of cerebrovascular disease; Hypertension; Jaundice; Mitral valve insufficiency and aortic valve insufficiency; Pre-diabetes; Sleep apnea; Sleep concern; Stroke ([place]) (03/11/2003); Wears glasses; Wears hearing aid in both ears; and Wears partial dentures.  PAST SURGICAL HISTORY: He  has a past surgical history that includes Foot surgery; Back surgery; Polypectomy; Tonsillectomy; Laparoscopic cholecystectomy single port (N/A, 06/01/2013); ERCP (N/A, 05/31/2013); Cholecystectomy; TEE without cardioversion (N/A, 05/09/2017); Shoulder arthroscopy with rotator cuff repair and subacromial decompression (Left, 05/18/2017); and Shoulder arthroscopy with rotator cuff repair and subacromial decompression (Left, 05/18/2017).  Allergies  Allergen Reactions  . Bee Venom Anaphylaxis and Swelling    No current facility-administered medications on file prior to encounter.    Current Outpatient Prescriptions on File Prior to Encounter  Medication Sig  . aspirin EC 81 MG tablet Take 81 mg by mouth daily.    [name] aspirin-sod bicarb-citric acid (ALKA-SELTZER) 325 MG TBEF tablet Take 650 mg by mouth daily as needed (indigestion).  [name] buPROPion (WELLBUTRIN SR) 150 MG 12 hr tablet Take 150 mg by mouth 2 (two) times daily.   . calcium carbonate (TUMS - DOSED IN MG ELEMENTAL CALCIUM) 500 MG chewable tablet Chew 2 tablets by mouth daily as needed for indigestion or heartburn.  [name]  citalopram (CELEXA) 20 MG tablet Take 20 mg by mouth daily before breakfast.   . EPIPEN 2-PAK 0.3 MG/0.3ML SOAJ injection Inject 0.3 mg into the muscle once as needed (allergic reaction).   [name] lisinopril (PRINIVIL,ZESTRIL) 20 MG tablet Take 20 mg by mouth at bedtime.    . lovastatin (MEVACOR) 20 MG tablet Take 1 tablet (20 mg total) by mouth at bedtime.  [name] omeprazole (PRILOSEC) 20 MG capsule Take 20 mg by mouth at bedtime.   . Pseudoeph-Doxylamine-DM-APAP (NYQUIL PO) Take 1 Dose by mouth at bedtime as needed (cough).  . docusate sodium (COLACE) 100 MG capsule Take 1 capsule (100 mg total) by mouth 3 (three) times daily as needed. (Patient not taking: Reported on 07/13/2017)  . oxyCODONE-acetaminophen (ROXICET) 5-325 MG tablet Take 1-2 tablets by mouth every 4 (four) hours as needed for severe pain. (Patient not taking: Reported on 07/13/2017)    FAMILY HISTORY:  His indicated that his mother is deceased. He indicated that his father is deceased.    SOCIAL HISTORY: He  reports that he has been smoking Cigarettes.  He has a 28.50 pack-year smoking history. He has never used smokeless tobacco. He reports that he drinks about 1.2 - 1.8 oz of alcohol per week . He reports that he does not use drugs.  REVIEW OF SYSTEMS:  Unable to complete as patient is on BiPAP   SUBJECTIVE:  RN reports hypotension despite 535ml fluid bolus  VITAL SIGNS: BP (!) 80/50 (BP Location: Right Arm)   Pulse (!) 52   Temp (!) 96.9 F (36.1 C) (Oral)   Resp 11   Ht 6' (1.829 m)   Wt 248 lb 0.3 oz (112.5 kg)   SpO2 100%   BMI 33.64 kg/m   HEMODYNAMICS:    VENTILATOR SETTINGS: FiO2 (%):  [35 %] 35 %  INTAKE / OUTPUT: I/O last 3 completed shifts: In: 328.7 [P.O.:240; I.V.:88.7] Out: 1345 [Urine:1345]  PHYSICAL EXAMINATION: General: obese male on BiPAP, intermittent periods of agitation    HEENT: MM pink/dry Neuro: Awake, alert, able to answer orientation questions appropriately but CV: s1s2 rrr, no m/r/g PULM: prolonged exp phase, audible wheezing, wheezing throughout  GG:EZMO, non-tender, bsx4 active  Extremities: warm/dry, no edema  Skin: no rashes or lesions  LABS:  BMET  Recent Labs Lab 07/13/17 1341 07/14/17 0322  NA 136 139  K 4.2 4.6  CL 108 110   CO2 21* 19*  BUN 22* 26*  CREATININE 1.20 1.52*  GLUCOSE 102* 143*    Electrolytes  Recent Labs Lab 07/13/17 1341 07/14/17 0322  CALCIUM 8.8* 8.5*  MG  --  2.2    CBC  Recent Labs Lab 07/13/17 1341 07/14/17 0322  WBC 13.5* 12.7*  HGB 8.6* 8.2*  HCT 26.6* 26.0*  PLT 343 334    Coag's  Recent Labs Lab 07/14/17 0422  INR 1.29    Sepsis Markers No results for input(s): LATICACIDVEN, PROCALCITON, O2SATVEN in the last 168 hours.  ABG No results for input(s): PHART, PCO2ART, PO2ART in the last 168 hours.  Liver Enzymes  Recent Labs Lab 07/13/17 1341  AST 28  ALT 22  ALKPHOS 36*  BILITOT 0.5  ALBUMIN 3.3*    Cardiac Enzymes  Recent Labs Lab 07/13/17 2125 07/14/17 0322 07/14/17 0844  TROPONINI 0.12* 0.11* 0.10*    Glucose No results for input(s): GLUCAP in the last 168 hours.  Imaging Dg Chest Port 1 View  Result Date: 07/14/2017 CLINICAL DATA:  Respiratory distress. EXAM: PORTABLE CHEST 1  VIEW COMPARISON:  07/13/2017 FINDINGS: Mild improvement in pulmonary edema. Cardiac enlargement with mild edema remains. Bibasilar atelectasis. Small right effusion IMPRESSION: Interval improvement in congestive heart failure with pulmonary edema. Electronically Signed   By: Franchot Gallo M.D.   On: 07/14/2017 10:38   Dg Chest Port 1 View  Result Date: 07/13/2017 CLINICAL DATA:  Acute respiratory distress. EXAM: PORTABLE CHEST 1 VIEW COMPARISON:  Earlier the same day FINDINGS: 1817 hours. Low lung volumes. The cardio pericardial silhouette is enlarged. Interval progression of interstitial and central alveolar opacity, features suggesting progression of pulmonary edema. Bones are diffusely demineralized. Telemetry leads overlie the chest. IMPRESSION: Low volume film with cardiomegaly and interval development of pulmonary edema pattern. Electronically Signed   By: Misty Stanley M.D.   On: 07/13/2017 18:44   Dg Chest Portable 1 View  Result Date: 07/13/2017 CLINICAL  DATA:  Sob, Hx of aortic insufficiency, heart murmur, hypertension, stroke EXAM: PORTABLE CHEST 1 VIEW COMPARISON:  Chest x-ray dated 08/23/2006. FINDINGS: Mild cardiomegaly, possibly accentuated by slightly lordotic patient positioning. Atherosclerotic changes noted at the aortic arch. Central pulmonary vascular congestion and mild bilateral interstitial edema. No confluent opacity to suggest a developing pneumonia. No pleural effusion or pneumothorax seen. No acute or suspicious osseous finding. IMPRESSION: 1. Cardiomegaly with central pulmonary vascular congestion and mild bilateral interstitial edema suggesting mild CHF/volume overload. Heart size may be accentuated by lordotic patient positioning. 2. Aortic atherosclerosis. Electronically Signed   By: Franki Cabot M.D.   On: 07/13/2017 14:15     STUDIES:  04/27/17  ECHO >> LV moderately dilated, LVEF 55-605, moderate AS, mild to mod AR, moderate to severe MR, LA moderately dilated, PA Peak pressure 32.   CULTURES:   ANTIBIOTICS:   SIGNIFICANT EVENTS: 8/02  Admit with SOB, [facility] 8/03  PCCM consulted for evaluation of respiratory distress   LINES/TUBES:   DISCUSSION: 71 y/o M, smoker, with PMH of HTN, PVD admitted on 8/2 with SOB and AF w RVR.  Required bipap overnight.  Developed intermittent agitation and treated with haldol, ativan and precedex.  Rising sr cr, decreased UOP.   ASSESSMENT / PLAN:  PULMONARY A: Acute Respiratory Distress - suspect in setting of undiagnosed COPD Suspected COPD with Acute Exacerbation Mild Pulmonary Edema - noted on CXR, in setting of AFwRVR P:   BiPAP support  Add duoneb Q6 + PRN albuterol Brovana + Pulmicort BID  Add solumedrol 60 mg IV BID Assess ABG now > dropped to 87% off bipap with increased WOB  Intermittent CXR   CARDIOVASCULAR A:  Suspected CAD - LHC timing pending Hypotension Bradycardia  AF with RVR  Hx AS/AR, MR P:  ICU monitoring of hemodynamics  Recommendations per  Cardiology  LHC tentative for Monday  Trend troponin  Concern hypotension is in setting of beta blocker received at 2200, volume depletion and precedex Consider PRN lopressor only > hold scheduled for now.  Dose held this am.   Repeat ECHO pending > baseline valvular issues   RENAL A:   AKI - baseline sr cr 1.1, on ACE-I at baseline, compounded by hypotension  Metabolic Acidosis  P:   Trend BMP / urinary output Replace electrolytes as indicated Avoid nephrotoxic agents, ensure adequate renal perfusion  GASTROINTESTINAL A:   At Risk Aspiration - in setting of increased work of breathing / distress  P:   NPO while on BiPAP  Clear liquids other wise if no respiratory distress   HEMATOLOGIC A:   Anemia  P:  Trend  CBC  Lovenox for DVT prophylaxis   INFECTIOUS A:   COPD Exacerbation - see above  P:   Monitor fever curve / WBC trend  No role abx for now  ENDOCRINE A:   At Risk Hyperglycemia - in setting of steroid use    P:   Monitor glucose on BMP   NEUROLOGIC A:   Acute Metabolic Encephalopathy - ? Hypoxia, delirium from medications, hypercarbia  P:   RASS goal: n/a  Hold precedex for now as concerned may be contributing to hypotension / bradycardia  Frequent reorientation  Limit sedating medications as able    FAMILY  - Updates: Wife updated on plan of care at bedside.    - Inter-disciplinary family meet or Palliative Care meeting due by: 8/10  CC Time: 71 minutes   Noe Gens, NP-C [place] [facility] Pgr: [phone] or if no answer [phone] 07/14/2017, 12:48 PM  Additional note - dramatic difference in exam / bronchospasm after his first breathing treatment.  ABG reviewed.    STAFF NOTE: I, Merrie Roof, MD FACP have personally reviewed patient's available data, including medical history, events of note, physical examination and test results as part of my evaluation. I have discussed with resident/NP and other care providers such as  pharmacist, RN and RRT. In addition, I personally evaluated patient and elicited key findings of: awakens , no distress on NIMV, some confusion, coarse bs, no murmur to neck, obese, slight increase size left leg to rt, pcxr which I reviewed showed int edema, prom hilum, he is confused and drinks daily, he received lasix and now drop in urine output and rise crt and low BP on precedex, it is unclear what came first , COPD exac?  R/u PE leading to fib rvr and some edema and resp failure? ABg reviewed, keep same schedule 4 hours on 1 hour off, not ready for cath, may need ett for that, I think his prior MOD AS may be playing a part in preload dependece, we need to treat copd exacerbation, dc lasix but caution a lot of bolus fluids, assess d dimer, echo for valve and rv and doplers ( he was comparing of leg cramps), he is NOT tolerating precedex, with etoh  Add versed, prn haldol ( qtc 400), follow closely , may need mech ventilation, I updated family, wife in full, low threhsold cvp assessment The patient is critically ill with multiple organ systems failure and requires high complexity decision making for assessment and support, frequent evaluation and titration of therapies, application of advanced monitoring technologies and extensive interpretation of multiple databases.   Critical Care Time devoted to patient care services described in this note is 30 Minutes. This time reflects time of care of this signee: Merrie Roof, MD FACP. This critical care time does not reflect procedure time, or teaching time or supervisory time of PA/NP/Med student/Med Resident etc but could involve care discussion time. Rest per NP/medical resident whose note is outlined above and that I agree with   Lavon Paganini. Titus Mould, MD, [place] Pgr: [place] [facility] 07/14/2017 2:20 PM

## 2017-07-14 NOTE — Progress Notes (Signed)
eLink Physician-Brief Progress Note Patient Name: Brandon [name] DOB: June 12, 1946 MRN: [id number]   Date of Service  07/14/2017  HPI/Events of Note  Called into patient room for severe agitation.  Patient had been given 1 mg ativan earlier.  Woke up combative and agitated.  QTc less than 500.  eICU Interventions  Plan: 5 mg Haldol IV now Nurse to contact [employer] MD in 30 minutes if patient remains agitated.     Intervention Category Major Interventions: Delirium, psychosis, severe agitation - evaluation and management  Bernardina Cacho 07/14/2017, 1:26 AM

## 2017-07-14 NOTE — Progress Notes (Signed)
[place] Progress Note Patient Name: Brandon [name] DOB: May 15, 1946 MRN: [id number]   Date of Service  07/14/2017  HPI/Events of Note  Pt is now intubated ABG 7.14/38/343/199% MAPs 55  A: Severe metabolic acidosis from elevated LA, AKI  eICU Interventions  1 amp bicarb Increase RR on vent Bolus 1lt LR Repeat labs Start antibiotics     Intervention Category Major Interventions: Acid-Base disturbance - evaluation and management  Nakia Remmers 07/14/2017, 8:01 PM

## 2017-07-14 NOTE — Progress Notes (Signed)
Progress Note  Patient Name: Brandon [name] Date of Encounter: 07/14/2017  Primary Cardiologist: Zadin Lange  Subjective   71 yo admitted with episodes of respiratory distress. Found to have PAF ( which corresponds to his episodes of respiratory distress  His rhythm has stabilized overnight   TSH is normal.  Creatinine is up to 1.52 Had lots of aggitation overnight.  Had to be restrained.   Still aggitated.    Inpatient Medications    Scheduled Meds: . aspirin  324 mg Oral Pre-Cath  . chlorhexidine  15 mL Mouth Rinse BID  . diltiazem  10 mg Intravenous Once  . enoxaparin (LOVENOX) injection  40 mg Subcutaneous Q24H  . furosemide  40 mg Intravenous Once  . mouth rinse  15 mL Mouth Rinse q12n4p  . metoprolol tartrate  25 mg Oral BID  . sodium chloride flush  3 mL Intravenous Q12H  . sodium chloride flush  3 mL Intravenous Q12H   Continuous Infusions: . sodium chloride    . sodium chloride    . sodium chloride 10 mL/hr at 07/14/17 0510  . dexmedetomidine 0.7 mcg/kg/hr (07/14/17 0909)  . diltiazem (CARDIZEM) infusion     PRN Meds: sodium chloride, sodium chloride, acetaminophen, LORazepam, metoprolol tartrate, nitroGLYCERIN, ondansetron (ZOFRAN) IV, sodium chloride flush, sodium chloride flush, zolpidem   Vital Signs    Vitals:   07/14/17 0700 07/14/17 0743 07/14/17 0800 07/14/17 0909  BP: (!) 79/51 (!) 84/52 (!) 70/46 (!) 83/56  Pulse: (!) 57 (!) 58 (!) 56 68  Resp: 17 14 16  (!) 30  Temp:  (!) 96.4 F (35.8 C)    TempSrc:  Axillary    SpO2: 98% 100% 100% 100%  Weight:      Height:        Intake/Output Summary (Last 24 hours) at 07/14/17 1000 Last data filed at 07/14/17 0800  Gross per 24 hour  Intake           354.81 ml  Output             1365 ml  Net         -1010.19 ml   Filed Weights   07/13/17 1342 07/13/17 2107 07/14/17 0429  Weight: 242 lb (109.8 kg) 250 lb 10.6 oz (113.7 kg) 248 lb 0.3 oz (112.5 kg)    Telemetry    NSR  - Personally  Reviewed  ECG     NSR  - Personally Reviewed  Physical Exam   GEN: middle age man.   Somewhat disoriented,  Able to answer some questions    Neck: No JVD Cardiac: RRR, 2/6 systolic  murmur , rubs, or gallops.  Respiratory:  + wheezes  GI: Soft, nontender, non-distended  MS: No edema; No deformity. Neuro:  Nonfocal  Psych:  +/- oriented.  Not as cooperative as we would like    Labs    Chemistry Recent Labs Lab 07/13/17 1341 07/14/17 0322  NA 136 139  K 4.2 4.6  CL 108 110  CO2 21* 19*  GLUCOSE 102* 143*  BUN 22* 26*  CREATININE 1.20 1.52*  CALCIUM 8.8* 8.5*  PROT 6.4*  --   ALBUMIN 3.3*  --   AST 28  --   ALT 22  --   ALKPHOS 36*  --   BILITOT 0.5  --   GFRNONAA 59* 44*  GFRAA >60 51*  ANIONGAP 7 10     Hematology Recent Labs Lab 07/13/17 1341 07/14/17 0322  WBC 13.5* 12.7*  RBC 2.79* 2.75*  HGB 8.6* 8.2*  HCT 26.6* 26.0*  MCV 95.3 94.5  MCH 30.8 29.8  MCHC 32.3 31.5  RDW 14.7 14.4  PLT 343 334    Cardiac Enzymes Recent Labs Lab 07/13/17 1458 07/13/17 2125 07/14/17 0322 07/14/17 0844  TROPONINI 0.09* 0.12* 0.11* 0.10*    Recent Labs Lab 07/13/17 1347  TROPIPOC 0.10*     BNP Recent Labs Lab 07/13/17 1341  BNP 408.4*     DDimer No results for input(s): DDIMER in the last 168 hours.   Radiology    Dg Chest Port 1 View  Result Date: 07/13/2017 CLINICAL DATA:  Acute respiratory distress. EXAM: PORTABLE CHEST 1 VIEW COMPARISON:  Earlier the same day FINDINGS: 1817 hours. Low lung volumes. The cardio pericardial silhouette is enlarged. Interval progression of interstitial and central alveolar opacity, features suggesting progression of pulmonary edema. Bones are diffusely demineralized. Telemetry leads overlie the chest. IMPRESSION: Low volume film with cardiomegaly and interval development of pulmonary edema pattern. Electronically Signed   By: Misty Stanley M.D.   On: 07/13/2017 18:44   Dg Chest Portable 1 View  Result Date:  07/13/2017 CLINICAL DATA:  Sob, Hx of aortic insufficiency, heart murmur, hypertension, stroke EXAM: PORTABLE CHEST 1 VIEW COMPARISON:  Chest x-ray dated 08/23/2006. FINDINGS: Mild cardiomegaly, possibly accentuated by slightly lordotic patient positioning. Atherosclerotic changes noted at the aortic arch. Central pulmonary vascular congestion and mild bilateral interstitial edema. No confluent opacity to suggest a developing pneumonia. No pleural effusion or pneumothorax seen. No acute or suspicious osseous finding. IMPRESSION: 1. Cardiomegaly with central pulmonary vascular congestion and mild bilateral interstitial edema suggesting mild CHF/volume overload. Heart size may be accentuated by lordotic patient positioning. 2. Aortic atherosclerosis. Electronically Signed   By: Franki Cabot M.D.   On: 07/13/2017 14:15    Cardiac Studies     Patient Profile     71 y.o. male with PAF with associated episodes of respiratory distress. Has known MR and AS/AI  Assessment & Plan    1.  Episodic respiratory distress:  Appears to be related to PAF with rapid vent response Has been better this am   2/ paroxysmal atrial fib:   Better this am     3.  Possible CAD:   He decompensates quite rapidly with his episode of AF with RVR.   This may be due to his AS / AI but I'm concerned about the presence of CAD Would like to cath today but his creatinine is up sligtly and he is still somewhat disoriented.  Will wait and see if he stabilizes over the weekend   4.  Acute renal insufficiency :   Creatinine is up to 1.52. Possibly due to the lasix.  5.  Hypotension:   BP is more stable now.   ? overdiuresis Echo today    Signed, Mertie Moores, MD  07/14/2017, 10:00 AM

## 2017-07-14 NOTE — Progress Notes (Signed)
[place] Progress Note Patient Name: Brandon [name] DOB: 28-Jun-1946 MRN: [id number]   Date of Service  07/14/2017  HPI/Events of Note  Pt is very agitated Getting haldol without effect  eICU Interventions  Restart precedex. Pt may need intubation Will ask bedside team to evaluate     Intervention Category Major Interventions: Delirium, psychosis, severe agitation - evaluation and management  Isatu Macinnes 07/14/2017, 6:49 PM

## 2017-07-14 NOTE — Procedures (Signed)
Intubation Procedure Note Brandon [name] [id number] 07/13/1946  Procedure: Intubation Indications: Respiratory insufficiency  Procedure Details Consent: family at bedside I spoek with them in detail regarding possible outcomes risks vs benefit. Emergent procedure Time Out: Verified patient identification, verified procedure, site/side was marked, verified correct patient position, special equipment/implants available, medications/allergies/relevent history reviewed, required imaging and test results available.  Time out was performed  Maximum sterile technique was used including gloves and mask.  MAC and 4    Evaluation Hemodynamic Status: Transient hypotension treated with fluid; O2 sats: stable throughout Patient's Current Condition: stable Complications: No apparent complications Patient did tolerate procedure well. Chest X-ray ordered to verify placement.  CXR: tube position acceptable.   Brandon [name] 07/14/2017

## 2017-07-14 NOTE — Progress Notes (Addendum)
[place] Progress Note Patient Name: Brandon [name] DOB: August 29, 1946 MRN: [id number]   Date of Service  07/14/2017  HPI/Events of Note  Called about LA of 5.2 D dimer 2.14 BP 76/54. Still on Bipap  eICU Interventions  Will bolus 500cc NS Check cultures, UA Low threshold to start antibiotics Follow repeat LA at 10 pm  Concern for DVT as per primary team Awaiting results of echo and LE dopplers.  Cannot do a CTA due to elevated cr     Intervention Category Major Interventions: Hypotension - evaluation and management  [name] 07/14/2017, 5:27 PM

## 2017-07-14 NOTE — Progress Notes (Signed)
CRITICAL VALUE ALERT  Critical Value:  Lactic= 5.2, D-dimer 2.14  Date & Time Notied:  07/14/2017 1708  Provider Notified: Dr. Vaughan Browner  Orders Received/Actions taken: IV bolus ordered   MD also informed of SBP trends in the 70-80s

## 2017-07-14 NOTE — Progress Notes (Signed)
Dr. Acie Fredrickson at bedside to evaluate pt. Updated of pt condition. Informed of SBP trends in the 70-80s, latest troponin at 0.10, Urine output of 25 mL in 3 hours and heart rate trends in the 50s. Informed of continued need for restraints with pt's agitation and restlessness. New orders received.

## 2017-07-14 NOTE — Significant Event (Cosign Needed)
S: 71 yr old male recently intubated Called to bedside patient hypotensive and bradycardic  ROS: unable to elicit patient intubated and sedated  Physical Exam: .Marland KitchenPhysical Exam  Constitutional: He appears well-developed.  HENT:  Head: Normocephalic and atraumatic.  Eyes: Pupils are equal, round, and reactive to light.  Neck: Neck supple.  Cardiovascular:  Bradycardic hypotensive S1 and S2 auscultated  Pulmonary/Chest: Breath sounds normal.  Abdominal: Soft. He exhibits distension.  Neurological:  sedated  Skin: Skin is warm and dry. Capillary refill takes 2 to 3 seconds.   Assessment: Notable metabolic acidosis  Hemodynamically unstable In critical condition   Plan: Given 2 amp bicarb Was started on Neo ggt Discussed with E-link and Cardiology RV appears normal unlikely PE Will start Dopamine 7mcg/kg/min Patient only has peripheral access. Will place central venous catheter   Dr Seward [facility]

## 2017-07-15 ENCOUNTER — Inpatient Hospital Stay (HOSPITAL_COMMUNITY): Payer: Medicare HMO

## 2017-07-15 DIAGNOSIS — N179 Acute kidney failure, unspecified: Secondary | ICD-10-CM

## 2017-07-15 DIAGNOSIS — R570 Cardiogenic shock: Secondary | ICD-10-CM

## 2017-07-15 DIAGNOSIS — R0602 Shortness of breath: Secondary | ICD-10-CM

## 2017-07-15 DIAGNOSIS — K7200 Acute and subacute hepatic failure without coma: Secondary | ICD-10-CM

## 2017-07-15 DIAGNOSIS — N170 Acute kidney failure with tubular necrosis: Secondary | ICD-10-CM

## 2017-07-15 DIAGNOSIS — J9601 Acute respiratory failure with hypoxia: Secondary | ICD-10-CM

## 2017-07-15 LAB — CBC
HCT: 24.7 % — ABNORMAL LOW (ref 39.0–52.0)
Hemoglobin: 7.7 g/dL — ABNORMAL LOW (ref 13.0–17.0)
MCH: 30 pg (ref 26.0–34.0)
MCHC: 31.2 g/dL (ref 30.0–36.0)
MCV: 96.1 fL (ref 78.0–100.0)
PLATELETS: 209 10*3/uL (ref 150–400)
RBC: 2.57 MIL/uL — ABNORMAL LOW (ref 4.22–5.81)
RDW: 14.7 % (ref 11.5–15.5)
WBC: 15.4 10*3/uL — AB (ref 4.0–10.5)

## 2017-07-15 LAB — POCT I-STAT 3, ART BLOOD GAS (G3+)
ACID-BASE DEFICIT: 7 mmol/L — AB (ref 0.0–2.0)
Bicarbonate: 16.9 mmol/L — ABNORMAL LOW (ref 20.0–28.0)
O2 Saturation: 97 %
PCO2 ART: 27 mmHg — AB (ref 32.0–48.0)
TCO2: 18 mmol/L (ref 0–100)
pH, Arterial: 7.404 (ref 7.350–7.450)
pO2, Arterial: 85 mmHg (ref 83.0–108.0)

## 2017-07-15 LAB — BASIC METABOLIC PANEL
ANION GAP: 18 — AB (ref 5–15)
Anion gap: 15 (ref 5–15)
BUN: 55 mg/dL — ABNORMAL HIGH (ref 6–20)
BUN: 61 mg/dL — AB (ref 6–20)
CALCIUM: 7.8 mg/dL — AB (ref 8.9–10.3)
CHLORIDE: 105 mmol/L (ref 101–111)
CO2: 18 mmol/L — ABNORMAL LOW (ref 22–32)
CO2: 22 mmol/L (ref 22–32)
CREATININE: 2.65 mg/dL — AB (ref 0.61–1.24)
Calcium: 7.6 mg/dL — ABNORMAL LOW (ref 8.9–10.3)
Chloride: 108 mmol/L (ref 101–111)
Creatinine, Ser: 3.1 mg/dL — ABNORMAL HIGH (ref 0.61–1.24)
GFR calc Af Amer: 22 mL/min — ABNORMAL LOW (ref >60)
GFR calc Af Amer: 26 mL/min — ABNORMAL LOW (ref >60)
GFR calc non Af Amer: 23 mL/min — ABNORMAL LOW (ref >60)
GFR, EST NON AFRICAN AMERICAN: 19 mL/min — AB (ref >60)
GLUCOSE: 173 mg/dL — AB (ref 65–99)
Glucose, Bld: 225 mg/dL — ABNORMAL HIGH (ref 65–99)
POTASSIUM: 4.3 mmol/L (ref 3.5–5.1)
Potassium: 4.7 mmol/L (ref 3.5–5.1)
SODIUM: 142 mmol/L (ref 135–145)
Sodium: 144 mmol/L (ref 135–145)

## 2017-07-15 LAB — TROPONIN I
TROPONIN I: 0.87 ng/mL — AB (ref <0.03)
Troponin I: 0.74 ng/mL (ref <0.03)
Troponin I: 0.86 ng/mL (ref <0.03)

## 2017-07-15 LAB — LACTIC ACID, PLASMA
LACTIC ACID, VENOUS: 9.1 mmol/L — AB (ref 0.5–1.9)
LACTIC ACID, VENOUS: 3.6 mmol/L — AB (ref 0.5–1.9)

## 2017-07-15 LAB — BLOOD GAS, ARTERIAL
ACID-BASE DEFICIT: 16 mmol/L — AB (ref 0.0–2.0)
Bicarbonate: 12.7 mmol/L — ABNORMAL LOW (ref 20.0–28.0)
DRAWN BY: 10006
FIO2: 50
MECHVT: 660 mL
O2 Saturation: 95.4 %
PEEP/CPAP: 5 cmH2O
PO2 ART: 128 mmHg — AB (ref 83.0–108.0)
Patient temperature: 98.6
RATE: 24 resp/min
pCO2 arterial: 49.6 mmHg — ABNORMAL HIGH (ref 32.0–48.0)
pH, Arterial: 7.037 — CL (ref 7.350–7.450)

## 2017-07-15 LAB — URINALYSIS, MICROSCOPIC (REFLEX)

## 2017-07-15 LAB — URINALYSIS, ROUTINE W REFLEX MICROSCOPIC
Bilirubin Urine: NEGATIVE
GLUCOSE, UA: NEGATIVE mg/dL
Ketones, ur: NEGATIVE mg/dL
Nitrite: NEGATIVE
PH: 5.5 (ref 5.0–8.0)
Protein, ur: NEGATIVE mg/dL
SPECIFIC GRAVITY, URINE: 1.015 (ref 1.005–1.030)

## 2017-07-15 LAB — MAGNESIUM: MAGNESIUM: 2.4 mg/dL (ref 1.7–2.4)

## 2017-07-15 LAB — GLUCOSE, CAPILLARY
GLUCOSE-CAPILLARY: 172 mg/dL — AB (ref 65–99)
GLUCOSE-CAPILLARY: 176 mg/dL — AB (ref 65–99)
Glucose-Capillary: 163 mg/dL — ABNORMAL HIGH (ref 65–99)

## 2017-07-15 LAB — TRIGLYCERIDES: TRIGLYCERIDES: 86 mg/dL (ref <150)

## 2017-07-15 MED ORDER — SODIUM BICARBONATE 8.4 % IV SOLN
200.0000 meq | Freq: Once | INTRAVENOUS | Status: AC
  Administered 2017-07-15: 200 meq via INTRAVENOUS
  Filled 2017-07-15: qty 200

## 2017-07-15 MED ORDER — SODIUM CHLORIDE 0.9% FLUSH
10.0000 mL | Freq: Two times a day (BID) | INTRAVENOUS | Status: DC
  Administered 2017-07-15 (×3): 10 mL
  Administered 2017-07-16: 30 mL
  Administered 2017-07-17 – 2017-07-31 (×17): 10 mL
  Administered 2017-07-31: 20 mL
  Administered 2017-08-01 – 2017-08-03 (×3): 10 mL

## 2017-07-15 MED ORDER — FENTANYL BOLUS VIA INFUSION
25.0000 ug | INTRAVENOUS | Status: DC | PRN
  Administered 2017-07-19 (×2): 25 ug via INTRAVENOUS
  Filled 2017-07-15: qty 25

## 2017-07-15 MED ORDER — VANCOMYCIN HCL 10 G IV SOLR: 1500.0000 mg | INTRAVENOUS | Status: DC

## 2017-07-15 MED ORDER — CHLORHEXIDINE GLUCONATE CLOTH 2 % EX PADS
6.0000 | MEDICATED_PAD | Freq: Every day | CUTANEOUS | Status: DC
  Administered 2017-07-15 – 2017-08-01 (×19): 6 via TOPICAL

## 2017-07-15 MED ORDER — SODIUM BICARBONATE 8.4 % IV SOLN
INTRAVENOUS | Status: DC
  Administered 2017-07-15 – 2017-07-16 (×2): via INTRAVENOUS
  Filled 2017-07-15 (×4): qty 75

## 2017-07-15 MED ORDER — VANCOMYCIN HCL IN DEXTROSE 750-5 MG/150ML-% IV SOLN
750.0000 mg | Freq: Once | INTRAVENOUS | Status: AC
  Administered 2017-07-15: 750 mg via INTRAVENOUS
  Filled 2017-07-15: qty 150

## 2017-07-15 MED ORDER — AMIODARONE IV BOLUS ONLY 150 MG/100ML
150.0000 mg | Freq: Once | INTRAVENOUS | Status: AC
  Administered 2017-07-15: 150 mg via INTRAVENOUS
  Filled 2017-07-15: qty 100

## 2017-07-15 MED ORDER — FUROSEMIDE 10 MG/ML IJ SOLN
80.0000 mg | Freq: Once | INTRAMUSCULAR | Status: AC
  Administered 2017-07-15: 80 mg via INTRAVENOUS
  Filled 2017-07-15: qty 8

## 2017-07-15 MED ORDER — FENTANYL CITRATE (PF) 100 MCG/2ML IJ SOLN
50.0000 ug | Freq: Once | INTRAMUSCULAR | Status: AC
  Administered 2017-07-23: 50 ug via INTRAVENOUS
  Filled 2017-07-15: qty 2

## 2017-07-15 MED ORDER — SODIUM BICARBONATE 8.4 % IV SOLN
INTRAVENOUS | Status: DC
  Administered 2017-07-15: 05:00:00 via INTRAVENOUS
  Filled 2017-07-15 (×2): qty 150

## 2017-07-15 MED ORDER — SODIUM CHLORIDE 0.9% FLUSH
10.0000 mL | INTRAVENOUS | Status: DC | PRN
  Administered 2017-07-27 (×2): 10 mL
  Filled 2017-07-15 (×2): qty 40

## 2017-07-15 MED ORDER — INSULIN ASPART 100 UNIT/ML ~~LOC~~ SOLN: 0.0000 [IU] | Freq: Every day | SUBCUTANEOUS | Status: DC

## 2017-07-15 MED ORDER — FENTANYL 2500MCG IN NS 250ML (10MCG/ML) PREMIX INFUSION
25.0000 ug/h | INTRAVENOUS | Status: DC
  Administered 2017-07-15: 250 ug/h via INTRAVENOUS
  Administered 2017-07-15 – 2017-07-16 (×2): 200 ug/h via INTRAVENOUS
  Administered 2017-07-16: 50 ug/h via INTRAVENOUS
  Administered 2017-07-16 (×2): 250 ug/h via INTRAVENOUS
  Administered 2017-07-17: 200 ug/h via INTRAVENOUS
  Administered 2017-07-18: 50 ug/h via INTRAVENOUS
  Administered 2017-07-19 – 2017-07-20 (×2): 100 ug/h via INTRAVENOUS
  Administered 2017-07-22: 125 ug/h via INTRAVENOUS
  Administered 2017-07-23: 100 ug/h via INTRAVENOUS
  Filled 2017-07-15 (×10): qty 250

## 2017-07-15 MED ORDER — NOREPINEPHRINE BITARTRATE 1 MG/ML IV SOLN
0.0000 ug/min | INTRAVENOUS | Status: DC
  Administered 2017-07-15: 2 ug/min via INTRAVENOUS
  Administered 2017-07-16: 6 ug/min via INTRAVENOUS
  Administered 2017-07-17: 3 ug/min via INTRAVENOUS
  Administered 2017-07-18: 1 ug/min via INTRAVENOUS
  Administered 2017-07-19: 2 ug/min via INTRAVENOUS
  Administered 2017-07-23 – 2017-07-24 (×2): 5 ug/min via INTRAVENOUS
  Administered 2017-07-24: 3 ug/min via INTRAVENOUS
  Administered 2017-07-25 – 2017-07-26 (×2): 4 ug/min via INTRAVENOUS
  Filled 2017-07-15 (×10): qty 4

## 2017-07-15 MED ORDER — MIDAZOLAM HCL 2 MG/2ML IJ SOLN
1.0000 mg | INTRAMUSCULAR | Status: DC | PRN
  Administered 2017-07-15 – 2017-07-18 (×6): 1 mg via INTRAVENOUS
  Filled 2017-07-15 (×4): qty 2

## 2017-07-15 MED ORDER — PROPOFOL 1000 MG/100ML IV EMUL
0.0000 ug/kg/min | INTRAVENOUS | Status: DC
  Administered 2017-07-15 (×3): 40 ug/kg/min via INTRAVENOUS
  Administered 2017-07-15: 5 ug/kg/min via INTRAVENOUS
  Administered 2017-07-16 (×2): 60 ug/kg/min via INTRAVENOUS
  Administered 2017-07-16: 55 ug/kg/min via INTRAVENOUS
  Administered 2017-07-16: 60 ug/kg/min via INTRAVENOUS
  Administered 2017-07-16: 35 ug/kg/min via INTRAVENOUS
  Administered 2017-07-16: 55 ug/kg/min via INTRAVENOUS
  Administered 2017-07-17: 10 ug/kg/min via INTRAVENOUS
  Administered 2017-07-17 (×2): 30 ug/kg/min via INTRAVENOUS
  Administered 2017-07-17 – 2017-07-18 (×3): 15 ug/kg/min via INTRAVENOUS
  Administered 2017-07-19: 10 ug/kg/min via INTRAVENOUS
  Administered 2017-07-19: 20 ug/kg/min via INTRAVENOUS
  Administered 2017-07-19: 30 ug/kg/min via INTRAVENOUS
  Administered 2017-07-20: 5 ug/kg/min via INTRAVENOUS
  Administered 2017-07-21: 20 ug/kg/min via INTRAVENOUS
  Administered 2017-07-21 – 2017-07-22 (×2): 10 ug/kg/min via INTRAVENOUS
  Administered 2017-07-22: 20 ug/kg/min via INTRAVENOUS
  Administered 2017-07-23: 14 ug/kg/min via INTRAVENOUS
  Filled 2017-07-15 (×20): qty 100
  Filled 2017-07-15: qty 200
  Filled 2017-07-15 (×8): qty 100

## 2017-07-15 MED ORDER — MIDAZOLAM HCL 2 MG/2ML IJ SOLN
1.0000 mg | INTRAMUSCULAR | Status: DC | PRN
  Administered 2017-07-16: 1 mg via INTRAVENOUS
  Filled 2017-07-15 (×5): qty 2

## 2017-07-15 MED ORDER — INSULIN ASPART 100 UNIT/ML ~~LOC~~ SOLN
0.0000 [IU] | Freq: Three times a day (TID) | SUBCUTANEOUS | Status: DC
  Administered 2017-07-15: 2 [IU] via SUBCUTANEOUS

## 2017-07-15 NOTE — Progress Notes (Signed)
Called Elink to report all over muscle contractions. 1mg  of versed given. Will continue to monitor

## 2017-07-15 NOTE — Consult Note (Signed)
PULMONARY / CRITICAL CARE MEDICINE   Name: Brandon [name] MRN: [id number] DOB: 26-Feb-1946    ADMISSION DATE:  07/13/2017 CONSULTATION DATE:  07/14/17 REFERRING MD:  Dr. Acie Fredrickson   CHIEF COMPLAINT:  Respiratory distress  HISTORY OF PRESENT ILLNESS:   71 y/o M who presented to [facility] on 8/2 with shortness of breath via [facility].  On arrival to the ER, he was unable to speak complete sentences and was diaphoretic.  He recently had rotator cuff surgery 05/18/17 and reported he had similar episodes after surgery.  While in ER, during periods of respiratory distress he was noted to have AF with RVR.  He was treated with IV lasix and Lopressor for rate control. The patient was started on a cardizem gtt and admitted by  Cardiology for evaluation of AFwRVR and pulmonary edema.  Overnight 8/2, he developed significant agitation and was given xanax 0.25 mg, haldol 5mg  + ativan 1 mg.  Ultimately, the patient was started on precedex for agitation. The patient was maintained on BiPAP for work of breathing.  Cardiology concerned that some of his symptoms are related to CAD and LHC was recommended.  Unfortunately, the patients sr cr rose from 1.2 to 1.52 on 8/3 limiting a LHC (baseline on ACE-I).  He developed hypotension on precedex and UOP decreased.  500 ml NS bolus attempted with no increase in UOP.  PCCM consulted for evaluation of SOB.    Wife reports pt has smoked since age 18, heaviest up to 3ppd, currently 1ppd.  He worked for [employer].  She denies known sick contacts, fevers, chills, nausea/vomiting, swelling of lower extremities, chest pain.  Pt has had intermittent periods of shortness of breath since his rotator cuff surgery.  She does note he occasionally gets choked on foods.  No recent travel, no exotic animals. She reports he continues to smoke and drinks a large tumbler full of alcohol / juice 4x per week while in the hot tub - never has had the shakes or withdrawal symptoms in the past.   SUBJECTIVE:   Progressive metabolic acidosis last 24 hours Required intubation for ventilatory insufficiency, agitation Hypertension, has received fluid boluses, currently on dopamine and phenylephrine Received Kayexalate for hyperkalemia early a.m. 8/4  VITAL SIGNS: BP (!) 97/53   Pulse 91   Temp 98 F (36.7 C) (Oral)   Resp (!) 30   Ht 6\' 2"  (1.88 m) Comment: measured  Wt 115.2 kg (253 lb 15.5 oz)   SpO2 97%   BMI 32.61 kg/m   HEMODYNAMICS:    VENTILATOR SETTINGS: Vent Mode: PRVC FiO2 (%):  [35 %-100 %] 40 % Set Rate:  [18 bmp-30 bmp] 30 bmp Vt Set:  [660 mL] 660 mL PEEP:  [5 cmH20] 5 cmH20 Plateau Pressure:  [19 cmH20-23 cmH20] 19 cmH20  INTAKE / OUTPUT: I/O last 3 completed shifts: In: 2558.4 [P.O.:240; I.V.:1518.4; Other:300; IV Piggyback:500] Out: 2640 [Urine:2640]  PHYSICAL EXAMINATION: General: Obese man, intubated, sedated HEENT: Oropharynx clear, endotracheal tube in place Neuro: Sedated, grimaces with pain only, not waking to voice, (fentanyl 200) CV: Irregular, distant, question possible 2/ 6 murmur PULM: Distant, clear, no wheeze, no crackles GI: Obese, soft, positive bowel sounds Extremities: No edema Skin: Bit dusky, no rash  LABS:  BMET  Recent Labs Lab 07/13/17 1341 07/14/17 0322 07/14/17 2227  NA 136 139 140  K 4.2 4.6 6.2*  CL 108 110 112*  CO2 21* 19* 14*  BUN 22* 26* 44*  CREATININE 1.20 1.52* 2.81*  GLUCOSE  102* 143* 134*    Electrolytes  Recent Labs Lab 07/13/17 1341 07/14/17 0322 07/14/17 2227  CALCIUM 8.8* 8.5* 7.9*  MG  --  2.2  --     CBC  Recent Labs Lab 07/13/17 1341 07/14/17 0322 07/14/17 2227  WBC 13.5* 12.7* 14.1*  HGB 8.6* 8.2* 8.5*  HCT 26.6* 26.0* 27.5*  PLT 343 334 274    Coag's  Recent Labs Lab 07/14/17 0422  INR 1.29    Sepsis Markers  Recent Labs Lab 07/14/17 1537 07/14/17 2228  LATICACIDVEN 5.2* 5.8*    ABG  Recent Labs Lab 07/14/17 1343 07/14/17 1938 07/15/17 0400  PHART 7.358  7.144* 7.037*  PCO2ART 23.6* 38.5 49.6*  PO2ART 156.0* 343.0* 128*    Liver Enzymes  Recent Labs Lab 07/13/17 1341 07/14/17 2227  AST 28 3,930*  ALT 22 2,416*  ALKPHOS 36* 60  BILITOT 0.5 1.5*  ALBUMIN 3.3* 3.0*    Cardiac Enzymes  Recent Labs Lab 07/14/17 0322 07/14/17 0844 07/14/17 2227  TROPONINI 0.11* 0.10* 0.07*    Glucose No results for input(s): GLUCAP in the last 168 hours.  Imaging Dg Chest Port 1 View  Result Date: 07/14/2017 CLINICAL DATA:  Central line placement. EXAM: PORTABLE CHEST 1 VIEW COMPARISON:  Chest x-ray from earlier same day. FINDINGS: Right IJ central line appears adequately positioned with tip at the level of the mid SVC. Endotracheal tube is well positioned with tip above the level of the carina. Cardiomediastinal silhouette appears stable. Perihilar and bibasilar opacities are stable, most likely edema. IMPRESSION: 1. Right IJ central line is adequately position with tip at the level of the mid SVC. 2. Endotracheal tube well positioned with tip just above the level of the carina. 3. Persistent perihilar and bibasilar edema indicating CHF/volume overload. Electronically Signed   By: Franki Cabot M.D.   On: 07/14/2017 22:16   Dg Chest Port 1 View  Result Date: 07/14/2017 CLINICAL DATA:  Respiratory distress. EXAM: PORTABLE CHEST 1 VIEW COMPARISON:  07/13/2017 FINDINGS: Mild improvement in pulmonary edema. Cardiac enlargement with mild edema remains. Bibasilar atelectasis. Small right effusion IMPRESSION: Interval improvement in congestive heart failure with pulmonary edema. Electronically Signed   By: Franchot Gallo M.D.   On: 07/14/2017 10:38   Dg Abd Portable 1v  Result Date: 07/15/2017 CLINICAL DATA:  Initial evaluation for OG tube placement EXAM: PORTABLE ABDOMEN - 1 VIEW COMPARISON:  Prior radiograph from earlier same day. FINDINGS: Enteric tube has been advanced with tip in side hole overlying the gastric body, well beyond the GE junction. Tip  projects distally. Visualized bowel gas pattern within normal limits. IMPRESSION: Tip in side hole of enteric tube overlying the gastric body, well beyond the GE junction. Tip projects distally. Electronically Signed   By: Jeannine Boga M.D.   On: 07/15/2017 04:58   Dg Abd Portable 1v  Result Date: 07/15/2017 CLINICAL DATA:  Orogastric tube placement.  Initial encounter. EXAM: PORTABLE ABDOMEN - 1 VIEW COMPARISON:  None. FINDINGS: The patient's enteric tube is noted ending overlying the gastroesophageal junction, with the side port noted at the distal esophagus. This could be advanced at least 10 cm. The visualized bowel gas pattern is grossly unremarkable. No free intra-abdominal air is seen, though evaluation for free air is limited on a single supine view. No acute osseous abnormalities are identified. Mild degenerative change is noted along the lumbar spine. IMPRESSION: Enteric tube noted ending overlying the gastroesophageal junction, with the side port at the distal esophagus. This could  be advanced at least 10 cm, as deemed clinically appropriate. Electronically Signed   By: Garald Balding M.D.   On: 07/15/2017 02:30     STUDIES:  04/27/17  ECHO >> LV moderately dilated, LVEF 55-605, moderate AS, mild to mod AR, moderate to severe MR, LA moderately dilated, PA Peak pressure 32.   CULTURES: Blood 8/3 >>  Respiratory 8/3 >>  MRSA screen 8/2 >> negative  ANTIBIOTICS:   SIGNIFICANT EVENTS: 8/02  Admit with SOB, [facility] 8/03  PCCM consulted for evaluation of respiratory distress   LINES/TUBES: ET tube 8/3 >>  CVC 8/3 >>   DISCUSSION: 71 y/o M, smoker, with PMH of HTN, PVD admitted on 8/2 with SOB and AF w RVR.  Required bipap overnight.  Developed intermittent agitation and treated with haldol, ativan and precedex.  Rising sr cr, decreased UOP.   ASSESSMENT / PLAN:  PULMONARY A: Acute respiratory failure, multifactorial due to COPD, acute encephalopathy, hemodynamic  instability from atrial fibrillation plus aortic stenosis, mitral regurgitation, severe metabolic acidosis with renal failure Probable COPD with Acute Exacerbation Acute cardiogenic Pulmonary Edema - noted on CXR, in setting of AFwRVR P:   Continue current ventilator support, 8cc/kg Continue scheduled bronchodilators: DuoNeb, Brovana, Pulmicort Brovana + Pulmicort BID  Empiric Solu-Medrol added 8/3 for presumed acute exacerbation COPD Follow chest x-ray Assessment for wake up and spontaneous breathing once underlying issues have been addressed/resolved  CARDIOVASCULAR A:  Suspected CAD - LHC timing pending Shock, principally cardiogenic Episodic Bradycardia, appears to be associated with Precedex  AF with RVR  Hx AS/AR, MR P:  Volume and edematous, telemetry in ICU Appreciate cardiology management Dopamine initiated in the setting of bradycardia (occurred after he was medically sedated), phenylephrine for pressure support Ultimately will need left heart catheterization when renal function will permit Has received some volume support given preload dependence with valvular disease  RENAL A:   Acute renal failure, likely ATN/hypoperfusion. Metabolic Acidosis, lactic acidosis plus acute renal failure P:   Bicarbonate drip initiated 8/3, follow ABG and BMP for stabilization May need to call nephrology for evaluation depending on trend of his urine output and acidosis Follow BMP, urine output Replace electrolytes as indicated Avoid nephrotoxic agents, ensure adequate renal perfusion  GASTROINTESTINAL A:   Shock liver Stress ulcer prophylaxis Nutrition P:   NPO Assessment possible tube feeding in the next 24 hours Follow LFT, coags  HEMATOLOGIC A:   Anemia  P:  Follow CBC Lovenox for DVT prophylaxis   INFECTIOUS A:   ? PNA, other source infection. No clear source at this time P:   Empiric vancomycin, Zosyn started 8/3 in the setting of overall decompensation Monitor  fever curve / WBC trend  Follow culture data, narrow antibiotics as able  ENDOCRINE A:   At Risk Hyperglycemia - in setting of steroid use    P:   Monitor glucose on BMP  Initiate ICU hyperglycemia protocol if consistently greater than 180  NEUROLOGIC A:   Acute Metabolic Encephalopathy - ? Hypoxia, delirium from medications, hypercarbia  P:   RASS goal: -1 to -2 Precedex stopped Sedation currently with fentanyl infusion Versed as needed   FAMILY  - Updates: I reviewed his status, acute issues with his wife and niece at bedside. Clarified that he would want all aggressive interventions to treat reversible problems. His wife did make it clear that he would not want prolonged support, life support if he could not improve to his previous quality of life.  - Inter-disciplinary family meet  or Palliative Care meeting due by: 8/10  Independent CC time 35 minutes  Baltazar Apo, MD, PhD 07/15/2017, 7:53 AM [facility] [phone] or if no answer [phone]

## 2017-07-15 NOTE — Progress Notes (Signed)
[facility] Progress Note Patient Name: Brandon [name] DOB: 1946-05-04 MRN: [id number]   Date of Service  07/15/2017  HPI/Events of Note  Agitation - Request to renew bilateral soft wrist restraints.   eICU Interventions  Will renew bilateral soft wrist restraints.      Intervention Category Minor Interventions: Agitation / anxiety - evaluation and management  Sommer,Steven Cornelia Copa 07/15/2017, 9:13 PM

## 2017-07-15 NOTE — Progress Notes (Addendum)
Pt has been in and out of afib since 1400. Pt with a lot of ectopy at this time. Still waiting on mag to result. On call cards MD made aware. See new order for amio bolus. Will administer and watch closely.  Lucius Conn, RN

## 2017-07-15 NOTE — Progress Notes (Signed)
Patient ID: Brandon [name], male   DOB: [birth year], 71 y.o.   MRN: [id number]     Advanced Heart Failure Rounding Note   Subjective:    8/3 developed progressive respiratory distress => Bipap => intubation.  With sedation, became hypotensive and bradycardic, dopamine + phenylephrine started.  Developed AKI witih creatinine to 2.81 today and hyperkalemic.  Hyperkalemia treated with HCO3 gtt and Kayexalate.  Also with metabolic acidosis with elevated lactate (HCO3 gtt begun).  LFTs rose to the 1000s range.  CXR with pulmonary edema.    This morning, SBP stable in 100s.  Dopamine now off, on phenylephrine 30 + HCO3 gtt 125 cc/hr.    Vancomycin/Zosyn started empirically (?PNA component).  Solumedrol started empirically, ?COPD exacerbation component.   CVP 15-16 this morning.   TEE (5/18): EF 55-60%, moderate AS, moderate AI, moderate MR.   TTE (8/18): EF 55-60%, moderate LVH, moderate AS mean 33, moderate AI, moderate-severe MR  Objective:   Weight Range: 253 lb 15.5 oz (115.2 kg) Body mass index is 32.61 kg/m.   Vital Signs:   Temp:  [96.7 F (35.9 C)-98.2 F (36.8 C)] 98 F (36.7 C) (08/04 0405) Pulse Rate:  [25-127] 80 (08/04 0730) Resp:  [11-30] 30 (08/04 0630) BP: (64-147)/(43-79) 95/56 (08/04 0730) SpO2:  [87 %-100 %] 97 % (08/04 0813) FiO2 (%):  [35 %-100 %] 40 % (08/04 0813) Weight:  [253 lb 15.5 oz (115.2 kg)] 253 lb 15.5 oz (115.2 kg) (08/04 0405)    Weight change: Filed Weights   07/13/17 2107 07/14/17 0429 07/15/17 0405  Weight: 250 lb 10.6 oz (113.7 kg) 248 lb 0.3 oz (112.5 kg) 253 lb 15.5 oz (115.2 kg)    Intake/Output:   Intake/Output Summary (Last 24 hours) at 07/15/17 0854 Last data filed at 07/15/17 0600  Gross per 24 hour  Intake          2203.62 ml  Output             1275 ml  Net           928.62 ml      Physical Exam    General:  Intubated/sedated.  HEENT: Normal Neck: Thick. JVP 14-16. No lymphadenopathy or thyromegaly  appreciated. Cor: PMI nondisplaced. Regular rate & rhythm. No rubs, gallops. 2/6 SEM RUSB.  Lungs: Dependent crackles Abdomen: Soft, nondistended. No hepatosplenomegaly. No bruits or masses. Good bowel sounds. Extremities: No cyanosis, clubbing, rash, edema Neuro: Sedated on vent   Telemetry   NSR, personally reviewed  Labs    CBC  Recent Labs  07/14/17 0322 07/14/17 2227  WBC 12.7* 14.1*  NEUTROABS  --  12.9*  HGB 8.2* 8.5*  HCT 26.0* 27.5*  MCV 94.5 95.8  PLT 334 734   Basic Metabolic Panel  Recent Labs  07/14/17 0322 07/14/17 2227  NA 139 140  K 4.6 6.2*  CL 110 112*  CO2 19* 14*  GLUCOSE 143* 134*  BUN 26* 44*  CREATININE 1.52* 2.81*  CALCIUM 8.5* 7.9*  MG 2.2  --    Liver Function Tests  Recent Labs  07/13/17 1341 07/14/17 2227  AST 28 3,930*  ALT 22 2,416*  ALKPHOS 36* 60  BILITOT 0.5 1.5*  PROT 6.4* 5.8*  ALBUMIN 3.3* 3.0*   No results for input(s): LIPASE, AMYLASE in the last 72 hours. Cardiac Enzymes  Recent Labs  07/14/17 0322 07/14/17 0844 07/14/17 2227  TROPONINI 0.11* 0.10* 0.07*    BNP: BNP (last 3 results)  Recent Labs  07/13/17 1341  BNP 408.4*    ProBNP (last 3 results) No results for input(s): PROBNP in the last 8760 hours.   D-Dimer  Recent Labs  07/14/17 1537  DDIMER 2.14*   Hemoglobin A1C No results for input(s): HGBA1C in the last 72 hours. Fasting Lipid Panel No results for input(s): CHOL, HDL, LDLCALC, TRIG, CHOLHDL, LDLDIRECT in the last 72 hours. Thyroid Function Tests  Recent Labs  07/13/17 2125  TSH 2.236    Other results:   Imaging    Dg Chest Port 1 View  Result Date: 07/14/2017 CLINICAL DATA:  Central line placement. EXAM: PORTABLE CHEST 1 VIEW COMPARISON:  Chest x-ray from earlier same day. FINDINGS: Right IJ central line appears adequately positioned with tip at the level of the mid SVC. Endotracheal tube is well positioned with tip above the level of the carina.  Cardiomediastinal silhouette appears stable. Perihilar and bibasilar opacities are stable, most likely edema. IMPRESSION: 1. Right IJ central line is adequately position with tip at the level of the mid SVC. 2. Endotracheal tube well positioned with tip just above the level of the carina. 3. Persistent perihilar and bibasilar edema indicating CHF/volume overload. Electronically Signed   By: Franki Cabot M.D.   On: 07/14/2017 22:16   Dg Chest Port 1 View  Result Date: 07/14/2017 CLINICAL DATA:  Respiratory distress. EXAM: PORTABLE CHEST 1 VIEW COMPARISON:  07/13/2017 FINDINGS: Mild improvement in pulmonary edema. Cardiac enlargement with mild edema remains. Bibasilar atelectasis. Small right effusion IMPRESSION: Interval improvement in congestive heart failure with pulmonary edema. Electronically Signed   By: Franchot Gallo M.D.   On: 07/14/2017 10:38   Dg Abd Portable 1v  Result Date: 07/15/2017 CLINICAL DATA:  Initial evaluation for OG tube placement EXAM: PORTABLE ABDOMEN - 1 VIEW COMPARISON:  Prior radiograph from earlier same day. FINDINGS: Enteric tube has been advanced with tip in side hole overlying the gastric body, well beyond the GE junction. Tip projects distally. Visualized bowel gas pattern within normal limits. IMPRESSION: Tip in side hole of enteric tube overlying the gastric body, well beyond the GE junction. Tip projects distally. Electronically Signed   By: Jeannine Boga M.D.   On: 07/15/2017 04:58   Dg Abd Portable 1v  Result Date: 07/15/2017 CLINICAL DATA:  Orogastric tube placement.  Initial encounter. EXAM: PORTABLE ABDOMEN - 1 VIEW COMPARISON:  None. FINDINGS: The patient's enteric tube is noted ending overlying the gastroesophageal junction, with the side port noted at the distal esophagus. This could be advanced at least 10 cm. The visualized bowel gas pattern is grossly unremarkable. No free intra-abdominal air is seen, though evaluation for free air is limited on a single  supine view. No acute osseous abnormalities are identified. Mild degenerative change is noted along the lumbar spine. IMPRESSION: Enteric tube noted ending overlying the gastroesophageal junction, with the side port at the distal esophagus. This could be advanced at least 10 cm, as deemed clinically appropriate. Electronically Signed   By: Garald Balding M.D.   On: 07/15/2017 02:30      Medications:     Scheduled Medications: . arformoterol  15 mcg Nebulization BID  . atropine      . budesonide (PULMICORT) nebulizer solution  0.5 mg Nebulization BID  . chlorhexidine  15 mL Mouth Rinse BID  . Chlorhexidine Gluconate Cloth  6 each Topical Daily  . enoxaparin (LOVENOX) injection  40 mg Subcutaneous Q24H  . fentaNYL      . fentaNYL (SUBLIMAZE) injection  50  mcg Intravenous Once  . furosemide  40 mg Intravenous Once  . furosemide  80 mg Intravenous Once  . ipratropium-albuterol  3 mL Nebulization Q6H  . mouth rinse  15 mL Mouth Rinse q12n4p  . methylPREDNISolone (SOLU-MEDROL) injection  60 mg Intravenous Q12H  . midazolam  4 mg Intravenous Once  . pantoprazole (PROTONIX) IV  40 mg Intravenous Q24H  . sodium chloride flush  10-40 mL Intracatheter Q12H  . sodium chloride flush  3 mL Intravenous Q12H  . sodium chloride flush  3 mL Intravenous Q12H     Infusions: . sodium chloride    . sodium chloride    . sodium chloride 10 mL/hr at 07/15/17 0600  . DOPamine Stopped (07/14/17 2300)  . fentaNYL infusion INTRAVENOUS 200 mcg/hr (07/15/17 0649)  . norepinephrine (LEVOPHED) Adult infusion    . piperacillin-tazobactam (ZOSYN)  IV 3.375 g (07/15/17 0437)  . propofol (DIPRIVAN) infusion 10 mcg/kg/min (07/15/17 0823)  .  sodium bicarbonate  infusion 1000 mL 125 mL/hr at 07/15/17 0600  . vancomycin       PRN Medications:  sodium chloride, sodium chloride, acetaminophen, fentaNYL, haloperidol lactate, metoprolol tartrate, midazolam, midazolam, nitroGLYCERIN, ondansetron (ZOFRAN) IV, sodium  chloride flush, sodium chloride flush, sodium chloride flush    Patient Profile   71 yo with valvular heart disease and smoking/suspected COPD, CVA with carotid stenting was admitted with dyspnea/respiratory distress.   Assessment/Plan   1. Acute hypoxemic respiratory failure: Suspect this is due to pulmonary edema (noted on CXR).  He is empirically on antibiotics for ?PNA and Solumedrol for component of COPD exacerbation.  2. Acute on chronic diastolic CHF: Baseline has at least moderate valvular disease (moderate AS, moderate AI, moderate-severe MR).  Suspect diastolic CHF triggered by poorly tolerated paroxysmal atrial fibrillation in setting of valvular disease.  I think that his initial admission was triggered by diastolic CHF/pulmonary edema.  Volume overloaded with CVP 15-16.  Pulmonary edema on CXR.  Difficult situation, he has developed AKI with lactic acidosis in the setting of hypotension with sedation.  BP now stable with pressors being weaned down.  - If repeat BMET shows increased CO2, would cut back on HCO3 gtt (he does not need the volume).  - Challenge with Lasix 80 mg IV x 1 this morning.  - Eventually needs left/right heart cath, but want to see renal recovery first.  - Follow CVPs.  3. Shock: Probably mixed cardiogenic/vasodilatory.  Developed in the setting of sedation with Bipap then intubation as well as bradycardia with sedation.  He was on dopamine and phenylephrine, now just phenylephrine 30.  - Would transition to low dose norepinephrine and off phenylephrine, titrate down norepinephrine as able.  4. AKI: With hyperkalemia (treated) in setting of shock. Suspect ATN.  He is volume overloaded.  Now that BP stable, will challenge with Lasix bolus as noted above.  5. Elevated LFTs: Up to 1000s.  Suspect shock liver in setting of hypotension.  Follow LFTs daily.  6. Atrial fibrillation: Paroxysmal.  Seems to tolerate poorly.  Currently in NSR.  If afib/RVR recurs, could use  amiodarone if necessary though will hold off on prophylactic amiodarone at this point due to marked LFT elevation.  Not anticoagulated at this time with anemia of uncertain etiology.  7. Anemia: Recent fall in hgb to 8s range.  Cause uncertain but was weakly heme+ with stool in ER.   - Follow closely, transfuse hgb < 8 given concern for possible slow GI bleed.  - PPI -  Send Fe studies, IV Fe if low.  8. ID: No definite infection but covering with empiric abx for possible PNA.  9. COPD: Suspected.  Active smoker.  On empiric steroids.  10. Valvular heart disease: Echo this admission with moderate AS, moderate AI, moderate to severe MR.  May need reassessment with TEE.  May eventually need consideration of surgical valve repair/replacement with Maze given poorly tolerated afib.   CRITICAL CARE Performed by: Loralie Champagne  Total critical care time: 45 minutes  Critical care time was exclusive of separately billable procedures and treating other patients.  Critical care was necessary to treat or prevent imminent or life-threatening deterioration.  Critical care was time spent personally by me on the following activities: development of treatment plan with patient and/or surrogate as well as nursing, discussions with consultants, evaluation of patient's response to treatment, examination of patient, obtaining history from patient or surrogate, ordering and performing treatments and interventions, ordering and review of laboratory studies, ordering and review of radiographic studies, pulse oximetry and re-evaluation of patient's condition.   Length of Stay: 2   Loralie Champagne, MD  07/15/2017, 8:54 AM  Advanced Heart Failure Team Pager [phone] (M-F; 7a - 4p)  Please contact [facility] for night-coverage after hours (4p -7a ) and weekends on [email or web address]

## 2017-07-15 NOTE — Progress Notes (Signed)
CRITICAL VALUE ALERT  Critical Value:  ABG results   PH: 7.03   CO2: 49.6   PaCO2: 128   Bicarb: 12.7  Date & Time Notied:  07/15/17 0420  Provider Notified: Deterding  Orders Received/Actions taken: Starting Bicarb drip, changing RR to 30 on vent

## 2017-07-15 NOTE — Progress Notes (Signed)
VASCULAR LAB PRELIMINARY  PRELIMINARY  PRELIMINARY  PRELIMINARY  Bilateral lower extremity venous duplex completed.    Preliminary report:  There is no DVT or SVT noted in the bilateral lower extremities.   Aryanah Enslow, RVT 07/15/2017, 8:48 AM

## 2017-07-15 NOTE — Progress Notes (Signed)
Initial Nutrition Assessment  DOCUMENTATION CODES:   Obesity unspecified  INTERVENTION:   If remains intubated recommend:  Vital High Protein @ 10 ml/hr (240 ml/day) 60 ml Prostat five times per day Provides: 1240 kcal, 171 grams protein, and 200 ml free water.   TF regimen and propofol at current rate providing 1604 total kcal/day   NUTRITION DIAGNOSIS:   Inadequate oral intake related to inability to eat as evidenced by NPO status.  GOAL:   Provide needs based on ASPEN/SCCM guidelines  MONITOR:   I & O's, Vent status  REASON FOR ASSESSMENT:   Ventilator    ASSESSMENT:   Pt with PMH of smoker since age 71, HTN, PVD was admitted 8/2 with SOB and AF w RVR. Pt developed respiratory failure and was intubated 8/3.   Per MD probable COPD exacerbation and has severe metabolic acidosis with renal failure. Pt drinks large tumbler of ETOH/juice 4 x per week.  Patient is currently intubated on ventilator support  Spoke with wife and daughter. They report good appetite and no weight loss PTA.   Propofol: 13.8 ml/hr provides: 364 kcal per day from lipid Medications reviewed and include: lasix, solumedrol, phenylephrine off, norepinephrine @ 60 ml/hr, sodium bicarb @ 125 ml/hr 8/3 AST 3930, ALT 2416, K+ now WNL  Diet Order:  Diet NPO time specified  Skin:  Reviewed, no issues  Last BM:  unknown  Height:   Ht Readings from Last 1 Encounters:  07/14/17 6\' 2"  (1.88 m)    Weight:   Wt Readings from Last 1 Encounters:  07/15/17 253 lb 15.5 oz (115.2 kg)  Admission weight: 242 lb (124.7 kg)  Ideal Body Weight:  86.3 kg  BMI:  Body mass index is 32.61 kg/m.  Estimated Nutritional Needs:   Kcal:  2505-3976  Protein:  >/= 172 grams  Fluid:  >2 L/day  EDUCATION NEEDS:   No education needs identified at this time  [place], [place], [place] Pager [phone] After Hours Pager

## 2017-07-15 NOTE — Progress Notes (Signed)
[name] DOB: 1946/09/07 MRN: [id number]   Date of Service  07/15/2017  HPI/Events of Note  Worsening metabolic acidosis with a resp component with pH 7.03/49/128/12  eICU Interventions  Increase vent rate to 30 Bicarb gtt     Intervention Category Major Interventions: Acid-Base disturbance - evaluation and management  Montanna Mcbain 07/15/2017, 4:28 AM

## 2017-07-16 ENCOUNTER — Inpatient Hospital Stay (HOSPITAL_COMMUNITY): Payer: Medicare HMO

## 2017-07-16 DIAGNOSIS — I959 Hypotension, unspecified: Secondary | ICD-10-CM

## 2017-07-16 LAB — GLUCOSE, CAPILLARY
GLUCOSE-CAPILLARY: 187 mg/dL — AB (ref 65–99)
Glucose-Capillary: 179 mg/dL — ABNORMAL HIGH (ref 65–99)
Glucose-Capillary: 172 mg/dL — ABNORMAL HIGH (ref 65–99)
Glucose-Capillary: 174 mg/dL — ABNORMAL HIGH (ref 65–99)
Glucose-Capillary: 190 mg/dL — ABNORMAL HIGH (ref 65–99)

## 2017-07-16 LAB — CBC
HCT: 23.8 % — ABNORMAL LOW (ref 39.0–52.0)
HEMOGLOBIN: 7.8 g/dL — AB (ref 13.0–17.0)
MCH: 29.8 pg (ref 26.0–34.0)
MCHC: 32.8 g/dL (ref 30.0–36.0)
MCV: 90.8 fL (ref 78.0–100.0)
Platelets: 154 10*3/uL (ref 150–400)
RBC: 2.62 MIL/uL — AB (ref 4.22–5.81)
RDW: 14.4 % (ref 11.5–15.5)
WBC: 16.6 10*3/uL — ABNORMAL HIGH (ref 4.0–10.5)

## 2017-07-16 LAB — IRON AND TIBC
Iron: 8 ug/dL — ABNORMAL LOW (ref 45–182)
SATURATION RATIOS: 3 % — AB (ref 17.9–39.5)
TIBC: 316 ug/dL (ref 250–450)
UIBC: 308 ug/dL

## 2017-07-16 LAB — COMPREHENSIVE METABOLIC PANEL
ALBUMIN: 2.8 g/dL — AB (ref 3.5–5.0)
ALT: 3363 U/L — AB (ref 17–63)
AST: 2620 U/L — AB (ref 15–41)
Alkaline Phosphatase: 64 U/L (ref 38–126)
Anion gap: 10 (ref 5–15)
BILIRUBIN TOTAL: 0.9 mg/dL (ref 0.3–1.2)
BUN: 58 mg/dL — AB (ref 6–20)
CHLORIDE: 104 mmol/L (ref 101–111)
CO2: 27 mmol/L (ref 22–32)
Calcium: 7.7 mg/dL — ABNORMAL LOW (ref 8.9–10.3)
Creatinine, Ser: 2.24 mg/dL — ABNORMAL HIGH (ref 0.61–1.24)
GFR calc Af Amer: 32 mL/min — ABNORMAL LOW (ref >60)
GFR calc non Af Amer: 28 mL/min — ABNORMAL LOW (ref >60)
GLUCOSE: 190 mg/dL — AB (ref 65–99)
POTASSIUM: 2.9 mmol/L — AB (ref 3.5–5.1)
Sodium: 141 mmol/L (ref 135–145)
Total Protein: 5.6 g/dL — ABNORMAL LOW (ref 6.5–8.1)

## 2017-07-16 LAB — BLOOD GAS, ARTERIAL
ACID-BASE EXCESS: 3.2 mmol/L — AB (ref 0.0–2.0)
BICARBONATE: 25.8 mmol/L (ref 20.0–28.0)
Drawn by: 249101
FIO2: 0.4
LHR: 26 {breaths}/min
O2 Saturation: 89.4 %
PATIENT TEMPERATURE: 98.6
PCO2 ART: 29.6 mmHg — AB (ref 32.0–48.0)
PEEP: 5 cmH2O
PO2 ART: 59 mmHg — AB (ref 83.0–108.0)
VT: 660 mL
pH, Arterial: 7.548 — ABNORMAL HIGH (ref 7.350–7.450)

## 2017-07-16 LAB — FERRITIN: FERRITIN: 89 ng/mL (ref 24–336)

## 2017-07-16 LAB — PROTIME-INR
INR: 2
Prothrombin Time: 23 seconds — ABNORMAL HIGH (ref 11.4–15.2)

## 2017-07-16 LAB — RETICULOCYTES
RBC.: 2.67 MIL/uL — AB (ref 4.22–5.81)
RETIC CT PCT: 3.3 % — AB (ref 0.4–3.1)
Retic Count, Absolute: 88.1 10*3/uL (ref 19.0–186.0)

## 2017-07-16 LAB — VITAMIN B12: VITAMIN B 12: 7169 pg/mL — AB (ref 180–914)

## 2017-07-16 LAB — FOLATE: Folate: 21.3 ng/mL (ref >5.9)

## 2017-07-16 MED ORDER — PRO-STAT SUGAR FREE PO LIQD
60.0000 mL | Freq: Every day | ORAL | Status: DC
  Administered 2017-07-16 – 2017-07-25 (×45): 60 mL
  Filled 2017-07-16 (×42): qty 60

## 2017-07-16 MED ORDER — VITAL HIGH PROTEIN PO LIQD
1000.0000 mL | ORAL | Status: DC
  Administered 2017-07-16 – 2017-07-17 (×2): 1000 mL

## 2017-07-16 MED ORDER — METHYLPREDNISOLONE SODIUM SUCC 40 MG IJ SOLR
40.0000 mg | Freq: Two times a day (BID) | INTRAMUSCULAR | Status: DC
  Administered 2017-07-16 – 2017-07-17 (×3): 40 mg via INTRAVENOUS
  Filled 2017-07-16 (×3): qty 1

## 2017-07-16 MED ORDER — POTASSIUM CHLORIDE 10 MEQ/50ML IV SOLN
10.0000 meq | INTRAVENOUS | Status: AC
  Administered 2017-07-16 (×6): 10 meq via INTRAVENOUS
  Filled 2017-07-16 (×6): qty 50

## 2017-07-16 MED ORDER — ORAL CARE MOUTH RINSE
15.0000 mL | Freq: Four times a day (QID) | OROMUCOSAL | Status: DC
  Administered 2017-07-16 – 2017-07-22 (×27): 15 mL via OROMUCOSAL

## 2017-07-16 MED ORDER — AMIODARONE HCL 200 MG PO TABS
400.0000 mg | ORAL_TABLET | Freq: Two times a day (BID) | ORAL | Status: DC
  Administered 2017-07-16 – 2017-07-17 (×4): 400 mg via ORAL
  Filled 2017-07-16 (×4): qty 2

## 2017-07-16 MED ORDER — INSULIN ASPART 100 UNIT/ML ~~LOC~~ SOLN
0.0000 [IU] | SUBCUTANEOUS | Status: DC
  Administered 2017-07-16 – 2017-07-17 (×9): 4 [IU] via SUBCUTANEOUS
  Administered 2017-07-17: 3 [IU] via SUBCUTANEOUS
  Administered 2017-07-17 – 2017-07-18 (×2): 4 [IU] via SUBCUTANEOUS
  Administered 2017-07-18: 3 [IU] via SUBCUTANEOUS
  Administered 2017-07-18: 4 [IU] via SUBCUTANEOUS
  Administered 2017-07-18: 3 [IU] via SUBCUTANEOUS
  Administered 2017-07-18 – 2017-07-19 (×3): 4 [IU] via SUBCUTANEOUS
  Administered 2017-07-19 (×3): 3 [IU] via SUBCUTANEOUS
  Administered 2017-07-19: 4 [IU] via SUBCUTANEOUS
  Administered 2017-07-20: 3 [IU] via SUBCUTANEOUS
  Administered 2017-07-20 (×4): 4 [IU] via SUBCUTANEOUS
  Administered 2017-07-21 (×2): 3 [IU] via SUBCUTANEOUS
  Administered 2017-07-21: 4 [IU] via SUBCUTANEOUS
  Administered 2017-07-21: 3 [IU] via SUBCUTANEOUS
  Administered 2017-07-22 (×2): 4 [IU] via SUBCUTANEOUS
  Administered 2017-07-22 – 2017-07-23 (×7): 3 [IU] via SUBCUTANEOUS
  Administered 2017-07-23: 4 [IU] via SUBCUTANEOUS
  Administered 2017-07-24: 3 [IU] via SUBCUTANEOUS
  Administered 2017-07-24 (×4): 4 [IU] via SUBCUTANEOUS
  Administered 2017-07-25 (×5): 3 [IU] via SUBCUTANEOUS
  Administered 2017-07-25: 4 [IU] via SUBCUTANEOUS
  Administered 2017-07-26 – 2017-08-05 (×25): 3 [IU] via SUBCUTANEOUS
  Administered 2017-08-05: 4 [IU] via SUBCUTANEOUS
  Administered 2017-08-06: 3 [IU] via SUBCUTANEOUS
  Administered 2017-08-06: 4 [IU] via SUBCUTANEOUS
  Administered 2017-08-06: 3 [IU] via SUBCUTANEOUS
  Administered 2017-08-06 (×2): 4 [IU] via SUBCUTANEOUS

## 2017-07-16 MED ORDER — POTASSIUM CHLORIDE 20 MEQ PO PACK
40.0000 meq | PACK | Freq: Once | ORAL | Status: AC
  Administered 2017-07-16: 40 meq
  Filled 2017-07-16: qty 2

## 2017-07-16 MED ORDER — FUROSEMIDE 10 MG/ML IJ SOLN
80.0000 mg | Freq: Two times a day (BID) | INTRAMUSCULAR | Status: AC
Start: 2017-07-16 — End: 2017-07-16
  Administered 2017-07-16 (×2): 80 mg via INTRAVENOUS
  Filled 2017-07-16 (×2): qty 8

## 2017-07-16 MED ORDER — CHLORHEXIDINE GLUCONATE 0.12% ORAL RINSE (MEDLINE KIT)
15.0000 mL | Freq: Two times a day (BID) | OROMUCOSAL | Status: DC
  Administered 2017-07-16 – 2017-07-21 (×12): 15 mL via OROMUCOSAL

## 2017-07-16 NOTE — Progress Notes (Signed)
Patient ID: LAWRENCE MITCH, male   DOB: 01-05-1946, 71 y.o.   MRN: [id number]     Advanced Heart Failure Rounding Note   Subjective:    8/3 developed progressive respiratory distress => Bipap => intubation.  With sedation, became hypotensive and bradycardic, dopamine + phenylephrine started.  Developed AKI witih creatinine to 2.81 today and hyperkalemic.  Hyperkalemia treated with HCO3 gtt and Kayexalate.  Also with metabolic acidosis with elevated lactate (HCO3 gtt begun).  LFTs rose to the 1000s range.  CXR with pulmonary edema, right pleural effusion.    He remains on norepinephrine 5 with SBP 100s.  He is now off HCO3 gtt.  Creatinine trending down 2.65 => 2.24.  CVP 12 this morning.   Hgb down to 7.8, no overt bleeding.   Vancomycin/Zosyn started empirically (?PNA component) => vancomycin stopped today.  Solumedrol started empirically, ?COPD exacerbation component.    Wakes up with sedation wean.   Run of atrial fibrillation with RVR yesterday, got amiodarone bolus.  Having PACs, short afib runs this morning.  LFTs remain elevated.   TEE (5/18): EF 55-60%, moderate AS, moderate AI, moderate MR.   TTE (8/18): EF 55-60%, moderate LVH, moderate AS mean 33, moderate AI, moderate-severe MR  Objective:   Weight Range: 254 lb 6.6 oz (115.4 kg) Body mass index is 32.66 kg/m.   Vital Signs:   Temp:  [97.9 F (36.6 C)-98.8 F (37.1 C)] 98.3 F (36.8 C) (08/05 0859) Pulse Rate:  [34-131] 124 (08/05 0836) Resp:  [14-30] 21 (08/05 0836) BP: (87-144)/(44-74) 105/68 (08/05 0836) SpO2:  [89 %-99 %] 94 % (08/05 0836) FiO2 (%):  [40 %-50 %] 50 % (08/05 0836) Weight:  [254 lb 6.6 oz (115.4 kg)] 254 lb 6.6 oz (115.4 kg) (08/05 0345)    Weight change: Filed Weights   07/14/17 0429 07/15/17 0405 07/16/17 0345  Weight: 248 lb 0.3 oz (112.5 kg) 253 lb 15.5 oz (115.2 kg) 254 lb 6.6 oz (115.4 kg)    Intake/Output:   Intake/Output Summary (Last 24 hours) at 07/16/17 0924 Last data  filed at 07/16/17 0600  Gross per 24 hour  Intake          4023.37 ml  Output             3690 ml  Net           333.37 ml      Physical Exam    General:  Intubated/sedated.  HEENT: Normal Neck: Thick. JVP 12. No lymphadenopathy or thyromegaly appreciated. Cor: PMI nondisplaced. Regular rate & rhythm. No rubs, gallops. 2/6 SEM RUSB.  Lungs: Dependent crackles Abdomen: Soft, nondistended. No hepatosplenomegaly. No bruits or masses. Good bowel sounds. Extremities: No cyanosis, clubbing, rash, edema Neuro: Sedated on vent  Telemetry   Personally reviewed, NSR with PACs and short afib runs  Labs    CBC  Recent Labs  07/14/17 2227 07/15/17 0819 07/16/17 0415  WBC 14.1* 15.4* 16.6*  NEUTROABS 12.9*  --   --   HGB 8.5* 7.7* 7.8*  HCT 27.5* 24.7* 23.8*  MCV 95.8 96.1 90.8  PLT 274 209 371   Basic Metabolic Panel  Recent Labs  07/14/17 0322  07/15/17 1518 07/16/17 0346  NA 139  < > 142 141  K 4.6  < > 4.3 2.9*  CL 110  < > 105 104  CO2 19*  < > 22 27  GLUCOSE 143*  < > 225* 190*  BUN 26*  < > 61* 58*  CREATININE 1.52*  < > 2.65* 2.24*  CALCIUM 8.5*  < > 7.6* 7.7*  MG 2.2  --  2.4  --   < > = values in this interval not displayed. Liver Function Tests  Recent Labs  07/14/17 2227 07/16/17 0346  AST 3,930* 2,620*  ALT 2,416* 3,363*  ALKPHOS 60 64  BILITOT 1.5* 0.9  PROT 5.8* 5.6*  ALBUMIN 3.0* 2.8*   No results for input(s): LIPASE, AMYLASE in the last 72 hours. Cardiac Enzymes  Recent Labs  07/15/17 0819 07/15/17 1518 07/15/17 2142  TROPONINI 0.74* 0.86* 0.87*    BNP: BNP (last 3 results)  Recent Labs  07/13/17 1341  BNP 408.4*    ProBNP (last 3 results) No results for input(s): PROBNP in the last 8760 hours.   D-Dimer  Recent Labs  07/14/17 1537  DDIMER 2.14*   Hemoglobin A1C No results for input(s): HGBA1C in the last 72 hours. Fasting Lipid Panel  Recent Labs  07/15/17 0819  TRIG 86   Thyroid Function Tests  Recent  Labs  07/13/17 2125  TSH 2.236    Other results:   Imaging    Dg Chest Port 1 View  Result Date: 07/16/2017 CLINICAL DATA:  Acute respiratory failure. Endotracheal tube present. EXAM: PORTABLE CHEST 1 VIEW COMPARISON:  07/14/2017 FINDINGS: A new nasogastric tube is seen entering the stomach. Endotracheal tube and right jugular central venous catheter remain in appropriate position. Layering right pleural effusion is mildly increased in size. Cardiomegaly is stable. Persistent consolidation or collapse of the left lower lobe. Diffuse interstitial edema shows mild improvement. No pneumothorax visualized. IMPRESSION: Increased size of layering right pleural effusion. Stable left lower lobe consolidation or collapse. Mild improvement in diffuse interstitial edema pattern. Electronically Signed   By: Earle Gell M.D.   On: 07/16/2017 07:40     Medications:     Scheduled Medications: . amiodarone  400 mg Oral BID  . arformoterol  15 mcg Nebulization BID  . budesonide (PULMICORT) nebulizer solution  0.5 mg Nebulization BID  . chlorhexidine gluconate (MEDLINE KIT)  15 mL Mouth Rinse BID  . Chlorhexidine Gluconate Cloth  6 each Topical Daily  . enoxaparin (LOVENOX) injection  40 mg Subcutaneous Q24H  . feeding supplement (PRO-STAT SUGAR FREE 64)  60 mL Per Tube 5 X Daily  . feeding supplement (VITAL HIGH PROTEIN)  1,000 mL Per Tube Q24H  . fentaNYL (SUBLIMAZE) injection  50 mcg Intravenous Once  . furosemide  80 mg Intravenous BID  . insulin aspart  0-20 Units Subcutaneous Q4H  . ipratropium-albuterol  3 mL Nebulization Q6H  . mouth rinse  15 mL Mouth Rinse QID  . methylPREDNISolone (SOLU-MEDROL) injection  40 mg Intravenous BID  . pantoprazole (PROTONIX) IV  40 mg Intravenous Q24H  . potassium chloride  40 mEq Per Tube Once  . sodium chloride flush  10-40 mL Intracatheter Q12H  . sodium chloride flush  3 mL Intravenous Q12H  . sodium chloride flush  3 mL Intravenous Q12H     Infusions: . sodium chloride    . sodium chloride    . sodium chloride 10 mL/hr at 07/16/17 0400  . fentaNYL infusion INTRAVENOUS 200 mcg/hr (07/16/17 0837)  . norepinephrine (LEVOPHED) Adult infusion 4 mcg/min (07/16/17 0837)  . piperacillin-tazobactam (ZOSYN)  IV Stopped (07/16/17 0752)  . potassium chloride Stopped (07/16/17 0824)  . propofol (DIPRIVAN) infusion 55 mcg/kg/min (07/16/17 0829)    PRN Medications: sodium chloride, sodium chloride, acetaminophen, fentaNYL, metoprolol tartrate, midazolam, nitroGLYCERIN, ondansetron (  ZOFRAN) IV, sodium chloride flush, sodium chloride flush, sodium chloride flush    Patient Profile   71 yo with valvular heart disease and smoking/suspected COPD, CVA with carotid stenting was admitted with dyspnea/respiratory distress.   Assessment/Plan   1. Acute hypoxemic respiratory failure: Suspect this is due to pulmonary edema (noted on CXR).  He is empirically on antibiotics for ?PNA and Solumedrol for component of COPD exacerbation.  - Suspect he will be ready for extubation tomorrow.  2. Acute on chronic diastolic CHF: Baseline has at least moderate valvular disease (moderate AS, moderate AI, moderate-severe MR).  Suspect diastolic CHF triggered by poorly tolerated paroxysmal atrial fibrillation in setting of valvular disease.  I think that his initial admission was triggered by diastolic CHF/pulmonary edema.   He developed AKI with lactic acidosis in the setting of hypotension with sedation.  BP stable on norepinephrine 5.  CVP down some to 12, still with pulmonary edema on CXR.  - Lasix 80 mg IV bid today.  - Wean norepinephrine as able, may be easier when sedation lightened.  - Eventually needs left/right heart cath, but want to see renal recovery first.  3. Shock: Probably mixed cardiogenic/vasodilatory.  Developed in the setting of sedation with Bipap then intubation as well as bradycardia with sedation.  Now on norepinephrine 5, wean as  able.   4. AKI: With hyperkalemia (treated) in setting of shock. Suspect ATN.  Creatinine improved today.  5. Elevated LFTs: Up to 1000s.  Suspect shock liver in setting of hypotension.  Follow LFTs daily.   6. Atrial fibrillation: Paroxysmal.  Seems to tolerate poorly.  Having short runs of afib now, had long run afib with RVR yesterday. LFTs elevated but probably due to shock live.  - Will start amiodarone 400 mg bid per tube.  - Hold off on full anticoagulation with slowly falling hgb.  7. Anemia: Recent fall in hgb to 8s range, down to 7.8 today.  Cause uncertain but was weakly heme+ with stool in ER, may be slow GI bleed.  No overt bleeding noted here.  - Follow closely, will not transfuse yet.  - PPI - Send Fe studies, IV Fe if low.  - Will eventually need GI workup.  - Ideally would be anticoagulated but would hold off for now.  8. ID: No definite infection but covering with empiric abx for possible PNA => stop vancomycin today, likely stop Zosyn tomorrow.  9. COPD: Suspected.  Active smoker.  On empiric steroids.  10. Valvular heart disease: Echo this admission with moderate AS, moderate AI, moderate to severe MR.  May need reassessment with TEE.  May eventually need consideration of surgical valve repair/replacement with [place] given poorly tolerated afib.   CRITICAL CARE Performed by: Loralie Champagne  Total critical care time: 35 minutes  Critical care time was exclusive of separately billable procedures and treating other patients.  Critical care was necessary to treat or prevent imminent or life-threatening deterioration.  Critical care was time spent personally by me on the following activities: development of treatment plan with patient and/or surrogate as well as nursing, discussions with consultants, evaluation of patient's response to treatment, examination of patient, obtaining history from patient or surrogate, ordering and performing treatments and interventions, ordering and  review of laboratory studies, ordering and review of radiographic studies, pulse oximetry and re-evaluation of patient's condition.   Length of Stay: 3   Loralie Champagne, MD  07/16/2017, 9:24 AM  Advanced Heart Failure Team Pager [phone] (M-F; 7a -  4p)  Please contact [facility] for night-coverage after hours (4p -7a ) and weekends on [email or web address]

## 2017-07-16 NOTE — Progress Notes (Signed)
Patient noted to have more muscle tremors; MD notified; MD advised to administer PRN midazolam with recurrence; will continue to monitor.  Clyda Hurdle RN

## 2017-07-16 NOTE — Progress Notes (Signed)
[facility] Progress Note Patient Name: Brandon [name] DOB: 20-Aug-1946 MRN: [id number]   Date of Service  07/16/2017  HPI/Events of Note  Mixed alkalosis on ABG with pH of 7.54/30/59/26  eICU Interventions  D/C bicarb gtt     Intervention Category Major Interventions: Acid-Base disturbance - evaluation and management  Malonie Tatum 07/16/2017, 4:29 AM

## 2017-07-16 NOTE — Progress Notes (Signed)
PULMONARY / CRITICAL CARE MEDICINE   Name: Brandon [name] MRN: [id number] DOB: [birth year]    ADMISSION DATE:  07/13/2017 CONSULTATION DATE:  07/14/17 REFERRING MD:  Dr. Acie Fredrickson   CHIEF COMPLAINT:  Respiratory distress  HISTORY OF PRESENT ILLNESS:   71 y/o M who presented to [facility] on 8/2 with shortness of breath via [facility].  On arrival to the ER, he was unable to speak complete sentences and was diaphoretic.  He recently had rotator cuff surgery 05/18/17 and reported he had similar episodes after surgery.  While in ER, during periods of respiratory distress he was noted to have AF with RVR.  He was treated with IV lasix and Lopressor for rate control. The patient was started on a cardizem gtt and admitted by  Cardiology for evaluation of AFwRVR and pulmonary edema.  Overnight 8/2, he developed significant agitation and was given xanax 0.25 mg, haldol 5mg  + ativan 1 mg.  Ultimately, the patient was started on precedex for agitation. The patient was maintained on BiPAP for work of breathing.  Cardiology concerned that some of his symptoms are related to CAD and LHC was recommended.  Unfortunately, the patients sr cr rose from 1.2 to 1.52 on 8/3 limiting a LHC (baseline on ACE-I).  He developed hypotension on precedex and UOP decreased.  500 ml NS bolus attempted with no increase in UOP.  PCCM consulted for evaluation of SOB.    Wife reports pt has smoked since age 39, heaviest up to 3ppd, currently 1ppd.  He worked for [employer].  She denies known sick contacts, fevers, chills, nausea/vomiting, swelling of lower extremities, chest pain.  Pt has had intermittent periods of shortness of breath since his rotator cuff surgery.  She does note he occasionally gets choked on foods.  No recent travel, no exotic animals. She reports he continues to smoke and drinks a large tumbler full of alcohol / juice 4x per week while in the hot tub - never has had the shakes or withdrawal symptoms in the past.   SUBJECTIVE:   Bicarbonate drip able to be turned off 8/5 Some tremor noted overnight, Versed pushes added. Note renal fxn improved Now on norepi, dopa off  VITAL SIGNS: BP 121/67   Pulse 91   Temp 98.8 F (37.1 C) (Oral)   Resp (!) 26   Ht 6\' 2"  (1.88 m) Comment: measured  Wt 115.4 kg (254 lb 6.6 oz)   SpO2 97%   BMI 32.66 kg/m   HEMODYNAMICS: CVP:  [9 mmHg-17 mmHg] 13 mmHg  VENTILATOR SETTINGS: Vent Mode: PRVC FiO2 (%):  [40 %] 40 % Set Rate:  [26 bmp-30 bmp] 26 bmp Vt Set:  [660 mL] 660 mL PEEP:  [5 cmH20] 5 cmH20 Plateau Pressure:  [20 cmH20-23 cmH20] 23 cmH20  INTAKE / OUTPUT: I/O last 3 completed shifts: In: 6029.8 [I.V.:5429.8; Other:300; IV Piggyback:300] Out: 9326 [Urine:4740; Emesis/NG output:200]   PHYSICAL EXAMINATION: General: Obese man, intubated, sedated HEENT: Endotracheal tube in place, oropharynx clear Neuro: Sedated, grimaces with pain, propofol and fentanyl drips CV: Irregular, distant, systolic murmur PULM: Decreased at both bases, no crackles, no wheezes GI: Obese, soft, positive bowel sounds Extremities: No lower extremity edema Skin: No rash  LABS:  BMET  Recent Labs Lab 07/15/17 0819 07/15/17 1518 07/16/17 0346  NA 144 142 141  K 4.7 4.3 2.9*  CL 108 105 104  CO2 18* 22 27  BUN 55* 61* 58*  CREATININE 3.10* 2.65* 2.24*  GLUCOSE 173* 225* 190*  Electrolytes  Recent Labs Lab 07/14/17 0322  07/15/17 0819 07/15/17 1518 07/16/17 0346  CALCIUM 8.5*  < > 7.8* 7.6* 7.7*  MG 2.2  --   --  2.4  --   < > = values in this interval not displayed.  CBC  Recent Labs Lab 07/14/17 2227 07/15/17 0819 07/16/17 0415  WBC 14.1* 15.4* 16.6*  HGB 8.5* 7.7* 7.8*  HCT 27.5* 24.7* 23.8*  PLT 274 209 154    Coag's  Recent Labs Lab 07/14/17 0422 07/16/17 0346  INR 1.29 2.00    Sepsis Markers  Recent Labs Lab 07/14/17 2228 07/15/17 0819 07/15/17 1518  LATICACIDVEN 5.8* 9.1* 3.6*    ABG  Recent Labs Lab 07/15/17 0400  07/15/17 0902 07/16/17 0415  PHART 7.037* 7.404 7.548*  PCO2ART 49.6* 27.0* 29.6*  PO2ART 128* 85.0 59.0*    Liver Enzymes  Recent Labs Lab 07/13/17 1341 07/14/17 2227 07/16/17 0346  AST 28 3,930* 2,620*  ALT 22 2,416* 3,363*  ALKPHOS 36* 60 64  BILITOT 0.5 1.5* 0.9  ALBUMIN 3.3* 3.0* 2.8*    Cardiac Enzymes  Recent Labs Lab 07/15/17 0819 07/15/17 1518 07/15/17 2142  TROPONINI 0.74* 0.86* 0.87*    Glucose  Recent Labs Lab 07/15/17 1653 07/15/17 2027 07/15/17 2349 07/16/17 0405  GLUCAP 172* 176* 163* 179*    Imaging No results found.   STUDIES:  04/27/17  ECHO >> LV moderately dilated, LVEF 55-605, moderate AS, mild to mod AR, moderate to severe MR, LA moderately dilated, PA Peak pressure 32.   CULTURES: Blood 8/3 >>  Respiratory 8/3 >>  MRSA screen 8/2 >> negative  ANTIBIOTICS: Vanco 8/3 >> 8/5 Zosyn 8/3 >>   SIGNIFICANT EVENTS: 8/02  Admit with SOB, AFwRVR 8/03  PCCM consulted for evaluation of respiratory distress   LINES/TUBES: ET tube 8/3 >>  CVC 8/3 >>   DISCUSSION: 71 y/o M, smoker, with PMH of HTN, PVD admitted on 8/2 with SOB and AF w RVR.  Required bipap overnight.  Developed intermittent agitation and treated with haldol, ativan and precedex.  Rising sr cr, decreased UOP.   ASSESSMENT / PLAN:  PULMONARY A: Acute respiratory failure, multifactorial due to COPD, acute encephalopathy, hemodynamic instability from atrial fibrillation plus aortic stenosis, mitral regurgitation, severe metabolic acidosis with renal failure Probable COPD with Acute Exacerbation Acute cardiogenic Pulmonary Edema - noted on CXR, in setting of AFwRVR P:   Continue current ventilator support, 8 mL/kg Decreased respiratory rate 20, attempt to lighten sedation says that he can set his own respiratory rate and pH Continue bronchodilators as ordered: DuoNeb, Brovana, Pulmicort Decrease methylprednisolone to 40 mg twice a day, continue to wean over the next  several days Follow chest x-ray with volume removal Daily wakeup assessment, assess for spontaneous breathing when underlying issues have been addressed  CARDIOVASCULAR A:  Suspected CAD - LHC timing pending Shock, principally cardiogenic Episodic Bradycardia, appears to be associated with Precedex  AF with RVR  Hx AS/AR, MR P:  Hemodynamic monitoring and telemetry Appreciate cardiology management Dopamine stopped, phenylephrine changed to levophed Received a single dose amiodarone 8/4 tachycardia 150s, infusion not continued May ultimately require TEE to better evaluate valvular function He will ultimately need left heart catheterization when his overall stability and renal function will permit Diuresis as he can tolerate  RENAL A:   Acute renal failure, likely ATN/hypoperfusion. Metabolic Acidosis, lactic acidosis plus acute renal failure Hypokalemia P:   Bicarbonate drip discontinued, follow ABG Repeat furosemide 8/5 Follow BMP, urine output  Replace electrolytes as indicated Goal ensure adequate renal perfusion, avoid nephrotoxic agents (plan to stop vancomycin 8/5)  GASTROINTESTINAL A:   Shock liver Stress ulcer prophylaxis Nutrition P:   Start tube feeding A/5 Follow LFT, coags  HEMATOLOGIC A:   Anemia  P:  Follow CBC Lovenox for DVT prophylaxis   INFECTIOUS A:   ? PNA, other source infection. No clear source at this time P:   Stop vancomycin 8/5 Suspect that we can stop Zosyn on 8/6 if no further evidence to support pneumonia Follow culture data, fever curve, WBC curve  ENDOCRINE A:   Hyperglycemia P:   Sliding scale insulin protocol added 8/4. Change to every 4 scheduling  NEUROLOGIC A:   Acute Metabolic Encephalopathy - ? Hypoxia, delirium from medications, hypercarbia  History of alcohol use, at risk withdrawal P:   RASS goal: -1 to -2 Continue propofol and fentanyl Attempt to minimize sedation as we are able   FAMILY  - Updates: I  updated his wife and niece at bedside on 8/5  - Inter-disciplinary family meet or Palliative Care meeting due by: 8/10  Independent CC time 66 minutes  Baltazar Apo, MD, PhD 07/16/2017, 7:26 AM [facility] [phone] or if no answer [phone]

## 2017-07-16 NOTE — Progress Notes (Signed)
[facility] Progress Note Patient Name: Brandon [name] DOB: 03/08/1946 MRN: [id number]   Date of Service  07/16/2017  HPI/Events of Note  hypokalemia  eICU Interventions  Potassium replaced     Intervention Category Intermediate Interventions: Electrolyte abnormality - evaluation and management  Kearah Gayden 07/16/2017, 4:57 AM

## 2017-07-17 ENCOUNTER — Inpatient Hospital Stay (HOSPITAL_COMMUNITY): Payer: Medicare HMO

## 2017-07-17 DIAGNOSIS — J810 Acute pulmonary edema: Secondary | ICD-10-CM

## 2017-07-17 DIAGNOSIS — I5033 Acute on chronic diastolic (congestive) heart failure: Secondary | ICD-10-CM

## 2017-07-17 LAB — HEPATIC FUNCTION PANEL
ALBUMIN: 2.8 g/dL — AB (ref 3.5–5.0)
ALT: 2435 U/L — AB (ref 17–63)
AST: 804 U/L — AB (ref 15–41)
Alkaline Phosphatase: 66 U/L (ref 38–126)
Bilirubin, Direct: 0.3 mg/dL (ref 0.1–0.5)
Indirect Bilirubin: 0.5 mg/dL (ref 0.3–0.9)
Total Bilirubin: 0.8 mg/dL (ref 0.3–1.2)
Total Protein: 5.7 g/dL — ABNORMAL LOW (ref 6.5–8.1)

## 2017-07-17 LAB — GLUCOSE, CAPILLARY
GLUCOSE-CAPILLARY: 180 mg/dL — AB (ref 65–99)
GLUCOSE-CAPILLARY: 171 mg/dL — AB (ref 65–99)
Glucose-Capillary: 150 mg/dL — ABNORMAL HIGH (ref 65–99)
Glucose-Capillary: 160 mg/dL — ABNORMAL HIGH (ref 65–99)
Glucose-Capillary: 164 mg/dL — ABNORMAL HIGH (ref 65–99)
Glucose-Capillary: 187 mg/dL — ABNORMAL HIGH (ref 65–99)

## 2017-07-17 LAB — CBC
HEMATOCRIT: 26.4 % — AB (ref 39.0–52.0)
Hemoglobin: 8.3 g/dL — ABNORMAL LOW (ref 13.0–17.0)
MCH: 29.5 pg (ref 26.0–34.0)
MCHC: 31.4 g/dL (ref 30.0–36.0)
MCV: 94 fL (ref 78.0–100.0)
Platelets: 149 10*3/uL — ABNORMAL LOW (ref 150–400)
RBC: 2.81 MIL/uL — AB (ref 4.22–5.81)
RDW: 14.7 % (ref 11.5–15.5)
WBC: 15.2 10*3/uL — ABNORMAL HIGH (ref 4.0–10.5)

## 2017-07-17 LAB — BASIC METABOLIC PANEL
ANION GAP: 12 (ref 5–15)
BUN: 66 mg/dL — AB (ref 6–20)
CHLORIDE: 105 mmol/L (ref 101–111)
CO2: 27 mmol/L (ref 22–32)
Calcium: 8 mg/dL — ABNORMAL LOW (ref 8.9–10.3)
Creatinine, Ser: 1.98 mg/dL — ABNORMAL HIGH (ref 0.61–1.24)
GFR calc Af Amer: 37 mL/min — ABNORMAL LOW (ref >60)
GFR, EST NON AFRICAN AMERICAN: 32 mL/min — AB (ref >60)
Glucose, Bld: 174 mg/dL — ABNORMAL HIGH (ref 65–99)
POTASSIUM: 3.6 mmol/L (ref 3.5–5.1)
SODIUM: 144 mmol/L (ref 135–145)

## 2017-07-17 LAB — LACTIC ACID, PLASMA: Lactic Acid, Venous: 2.1 mmol/L (ref 0.5–1.9)

## 2017-07-17 LAB — MAGNESIUM: MAGNESIUM: 2.6 mg/dL — AB (ref 1.7–2.4)

## 2017-07-17 LAB — PROTIME-INR
INR: 1.67
PROTHROMBIN TIME: 19.9 s — AB (ref 11.4–15.2)

## 2017-07-17 MED ORDER — POTASSIUM CHLORIDE CRYS ER 20 MEQ PO TBCR: 40.0000 meq | EXTENDED_RELEASE_TABLET | Freq: Two times a day (BID) | ORAL | Status: DC

## 2017-07-17 MED ORDER — SODIUM CHLORIDE 0.9 % IV SOLN
510.0000 mg | INTRAVENOUS | Status: DC
  Administered 2017-07-17: 510 mg via INTRAVENOUS
  Filled 2017-07-17: qty 17

## 2017-07-17 MED ORDER — DOCUSATE SODIUM 50 MG/5ML PO LIQD
100.0000 mg | Freq: Two times a day (BID) | ORAL | Status: DC
  Administered 2017-07-17 – 2017-07-19 (×5): 100 mg via ORAL
  Filled 2017-07-17 (×6): qty 10

## 2017-07-17 MED ORDER — SENNA 8.6 MG PO TABS: 1.0000 | ORAL_TABLET | Freq: Every day | ORAL | Status: DC | PRN

## 2017-07-17 MED ORDER — POTASSIUM CHLORIDE 20 MEQ/15ML (10%) PO SOLN
40.0000 meq | Freq: Two times a day (BID) | ORAL | Status: AC
  Administered 2017-07-17 (×2): 40 meq via ORAL
  Filled 2017-07-17 (×2): qty 30

## 2017-07-17 MED ORDER — FUROSEMIDE 10 MG/ML IJ SOLN: 80.0000 mg | Freq: Two times a day (BID) | INTRAMUSCULAR | Status: DC

## 2017-07-17 MED ORDER — FUROSEMIDE 10 MG/ML IJ SOLN: 80.0000 mg | Freq: Once | INTRAMUSCULAR | Status: DC

## 2017-07-17 MED ORDER — FUROSEMIDE 10 MG/ML IJ SOLN
80.0000 mg | Freq: Three times a day (TID) | INTRAMUSCULAR | Status: DC
  Administered 2017-07-17 – 2017-07-21 (×13): 80 mg via INTRAVENOUS
  Filled 2017-07-17 (×13): qty 8

## 2017-07-17 MED ORDER — POLYETHYLENE GLYCOL 3350 17 G PO PACK
17.0000 g | PACK | Freq: Every day | ORAL | Status: DC
  Administered 2017-07-17 – 2017-07-22 (×4): 17 g via ORAL
  Filled 2017-07-17 (×5): qty 1

## 2017-07-17 MED ORDER — METHYLPREDNISOLONE SODIUM SUCC 40 MG IJ SOLR
40.0000 mg | Freq: Every day | INTRAMUSCULAR | Status: DC
  Administered 2017-07-18 – 2017-07-19 (×2): 40 mg via INTRAVENOUS
  Filled 2017-07-17 (×2): qty 1

## 2017-07-17 MED ORDER — VITAL HIGH PROTEIN PO LIQD
1000.0000 mL | ORAL | Status: DC
  Administered 2017-07-17: 1000 mL
  Administered 2017-07-18 (×5)
  Administered 2017-07-18 – 2017-07-20 (×4): 1000 mL
  Administered 2017-07-21 (×3)
  Administered 2017-07-21 – 2017-07-23 (×3): 1000 mL
  Administered 2017-07-23 (×3)
  Administered 2017-07-24: 1000 mL

## 2017-07-17 NOTE — Progress Notes (Signed)
Called by nursing staff for urinary retention.   Bladder scan with > 1 liter urine despite foley   Instructed to remove foley and place new foley.   New foley placed with over 1 liter of urine.   Nancy Manuele NP-C  1:47 PM

## 2017-07-17 NOTE — Progress Notes (Signed)
Patient ID: Brandon [name], male   DOB: 02-01-1946, 71 y.o.   MRN: [id number]     Advanced Heart Failure Rounding Note   Subjective:    8/3 developed progressive respiratory distress => Bipap => intubation.  With sedation, became hypotensive and bradycardic, dopamine + phenylephrine started.  Developed AKI. Hyperkalemia treated with HCO3 gtt and Kayexalate.  Also with metabolic acidosis with elevated lactate (HCO3 gtt begun).  LFTs rose to the 1000s range.  CXR bilateral pleural effusion.    Vancomycin/Zosyn started empirically (?PNA component) => vancomycin stopped8/5.  Solumedrol started empirically, ?COPD exacerbation component.    CVP up to 25. Sluggish urine output. Weight up 1 pound. Remains on lasix 80 mg three times a day. Remains intubated and sedated.   TEE (5/18): EF 55-60%, moderate AS, moderate AI, moderate MR.   TTE (8/18): EF 55-60%, moderate LVH, moderate AS mean 33, moderate AI, moderate-severe MR  Objective:   Weight Range: 255 lb 15.3 oz (116.1 kg) Body mass index is 32.86 kg/m.   Vital Signs:   Temp:  [97.9 F (36.6 C)-98.5 F (36.9 C)] 97.9 F (36.6 C) (08/06 0831) Pulse Rate:  [46-146] 69 (08/06 0800) Resp:  [19-20] 20 (08/06 0800) BP: (85-151)/(45-114) 111/81 (08/06 0800) SpO2:  [93 %-100 %] 99 % (08/06 0823) FiO2 (%):  [50 %] 50 % (08/06 0823) Weight:  [255 lb 15.3 oz (116.1 kg)] 255 lb 15.3 oz (116.1 kg) (08/06 0515)    Weight change: Filed Weights   07/15/17 0405 07/16/17 0345 07/17/17 0515  Weight: 253 lb 15.5 oz (115.2 kg) 254 lb 6.6 oz (115.4 kg) 255 lb 15.3 oz (116.1 kg)    Intake/Output:   Intake/Output Summary (Last 24 hours) at 07/17/17 0849 Last data filed at 07/17/17 0800  Gross per 24 hour  Intake          3872.78 ml  Output             1770 ml  Net          2102.78 ml      Physical Exam   CVP 25  General:  Intubated/sedated HEENT: normal Neck: supple. JVP to to jaw. Carotids 2+ bilat; no bruits. No lymphadenopathy or  thryomegaly appreciated. Cor: PMI nondisplaced. Irregular rate & rhythm. No rubs, gallops or murmurs. Lungs: crackles Abdomen: soft, nontender, nondistended. No hepatosplenomegaly. No bruits or masses. Good bowel sounds. Extremities: no cyanosis, clubbing, rash, edema Neuro: sedated.  Telemetry   Personally reviewed. NSR 90s PACS PVCs personally reviewed  Labs    CBC  Recent Labs  07/14/17 2227 07/15/17 0819 07/16/17 0415  WBC 14.1* 15.4* 16.6*  NEUTROABS 12.9*  --   --   HGB 8.5* 7.7* 7.8*  HCT 27.5* 24.7* 23.8*  MCV 95.8 96.1 90.8  PLT 274 209 035   Basic Metabolic Panel  Recent Labs  07/15/17 1518 07/16/17 0346 07/17/17 0405  NA 142 141 144  K 4.3 2.9* 3.6  CL 105 104 105  CO2 '22 27 27  ' GLUCOSE 225* 190* 174*  BUN 61* 58* 66*  CREATININE 2.65* 2.24* 1.98*  CALCIUM 7.6* 7.7* 8.0*  MG 2.4  --  2.6*   Liver Function Tests  Recent Labs  07/16/17 0346 07/17/17 0405  AST 2,620* 804*  ALT 3,363* 2,435*  ALKPHOS 64 66  BILITOT 0.9 0.8  PROT 5.6* 5.7*  ALBUMIN 2.8* 2.8*   No results for input(s): LIPASE, AMYLASE in the last 72 hours. Cardiac Enzymes  Recent Labs  07/15/17  3614 07/15/17 1518 07/15/17 2142  TROPONINI 0.74* 0.86* 0.87*    BNP: BNP (last 3 results)  Recent Labs  07/13/17 1341  BNP 408.4*    ProBNP (last 3 results) No results for input(s): PROBNP in the last 8760 hours.   D-Dimer  Recent Labs  07/14/17 1537  DDIMER 2.14*   Hemoglobin A1C No results for input(s): HGBA1C in the last 72 hours. Fasting Lipid Panel  Recent Labs  07/15/17 0819  TRIG 86   Thyroid Function Tests No results for input(s): TSH, T4TOTAL, T3FREE, THYROIDAB in the last 72 hours.  Invalid input(s): FREET3  Other results:   Imaging    Dg Chest Port 1 View  Result Date: 07/17/2017 CLINICAL DATA:  Respiratory failure EXAM: PORTABLE CHEST 1 VIEW COMPARISON:  07/16/2017 FINDINGS: Tubular devices are stable. Cardiomegaly is stable.  Bilateral central and basilar opacity is likely a combination of pleural fluid and airspace disease. It is not significantly changed. No pneumothorax. IMPRESSION: Stable bilateral pleural effusions and bilateral airspace disease. Electronically Signed   By: Marybelle Killings M.D.   On: 07/17/2017 07:25     Medications:     Scheduled Medications: . amiodarone  400 mg Oral BID  . arformoterol  15 mcg Nebulization BID  . budesonide (PULMICORT) nebulizer solution  0.5 mg Nebulization BID  . chlorhexidine gluconate (MEDLINE KIT)  15 mL Mouth Rinse BID  . Chlorhexidine Gluconate Cloth  6 each Topical Daily  . enoxaparin (LOVENOX) injection  40 mg Subcutaneous Q24H  . feeding supplement (PRO-STAT SUGAR FREE 64)  60 mL Per Tube 5 X Daily  . feeding supplement (VITAL HIGH PROTEIN)  1,000 mL Per Tube Q24H  . fentaNYL (SUBLIMAZE) injection  50 mcg Intravenous Once  . insulin aspart  0-20 Units Subcutaneous Q4H  . ipratropium-albuterol  3 mL Nebulization Q6H  . mouth rinse  15 mL Mouth Rinse QID  . methylPREDNISolone (SOLU-MEDROL) injection  40 mg Intravenous BID  . pantoprazole (PROTONIX) IV  40 mg Intravenous Q24H  . sodium chloride flush  10-40 mL Intracatheter Q12H  . sodium chloride flush  3 mL Intravenous Q12H  . sodium chloride flush  3 mL Intravenous Q12H    Infusions: . sodium chloride    . sodium chloride    . sodium chloride 10 mL/hr at 07/16/17 0400  . fentaNYL infusion INTRAVENOUS 50 mcg/hr (07/17/17 0800)  . norepinephrine (LEVOPHED) Adult infusion 1 mcg/min (07/17/17 0800)  . piperacillin-tazobactam (ZOSYN)  IV Stopped (07/17/17 0916)  . propofol (DIPRIVAN) infusion 10 mcg/kg/min (07/17/17 0800)    PRN Medications: sodium chloride, sodium chloride, acetaminophen, fentaNYL, metoprolol tartrate, midazolam, nitroGLYCERIN, ondansetron (ZOFRAN) IV, sodium chloride flush, sodium chloride flush, sodium chloride flush    Patient Profile   71 yo with valvular heart disease and  smoking/suspected COPD, CVA with carotid stenting was admitted with dyspnea/respiratory distress.   Assessment/Plan   1. Acute hypoxemic respiratory failure: Suspect this is due to pulmonary edema (noted on CXR).  He is empirically on antibiotics for ?PNA and Solumedrol for component of COPD exacerbation. Weaning sedation.  2. Acute on chronic diastolic CHF: Baseline has at least moderate valvular disease (moderate AS, moderate AI, moderate-severe MR).  Suspect diastolic CHF triggered by poorly tolerated paroxysmal atrial fibrillation in setting of valvular disease.  Admission thought to be triggered by diastolic CHF/pulmonary edema.   He developed AKI with lactic acidosis in the setting of hypotension with sedation.  On norepi 1 mc. BP stable on norepinephrine 5.   CVP up  from 12>15. Increase lasix to 80 mg three times a day.   - Eventually needs left/right heart cath, but want to see renal recovery first.  3. Shock: Probably mixed cardiogenic/vasodilatory.  Developed in the setting of sedation with Bipap then intubation as well as bradycardia with sedation. Coming down on norepi.   4. AKI: With hyperkalemia (treated) in setting of shock. Suspect ATN.  Creatinine coming down.  5. Elevated LFTs: Up to 1000s.  Suspect shock liver in setting of hypotension.  Follow LFTs daily.   6. Atrial fibrillation: Paroxysmal.  Seems to tolerate poorly.  Having short runs of afib now, had long run afib with RVR yesterday. LFTs elevated but probably due to shock liver. LFTs trending down.  - Continue  amiodarone 400 mg bid per tube.  - CBC pending.   7. Anemia: Recent fall in hgb to 8s range, down to 7.8 today.  Cause uncertain but was weakly heme+ with stool in ER, may be slow GI bleed.  No overt bleeding noted here.  - PPI - Anemia Panel- low Iron sats. Give feraheme today.  - Will eventually need GI workup.  - Ideally would be anticoagulated but would hold off for now.  8. ID: Blx CX- NGTD. No definite  infection but covering with empiric abx for possible PNA => stopped vancomycin. CBC pending. Likely stop zosyn.   9. COPD: Suspected.  Active smoker.  On empiric steroids.  10. Valvular heart disease: Echo this admission with moderate AS, moderate AI, moderate to severe MR.  May need reassessment with TEE.  May eventually need consideration of surgical valve repair/replacement with Maze given poorly tolerated afib.   Family considering Palliative Care.   Length of Stay: 4   Amy Clegg, NP  07/17/2017, 8:49 AM  Advanced Heart Failure Team Pager [phone] (M-F; 7a - 4p)  Please contact [facility] for night-coverage after hours (4p -7a ) and weekends on [email or web address]  Patient seen with NP, agree with the above note.    CVP up to 25 this morning, bladder scan showed marked urinary retention.  Foley changed, > 1 L out.  Lasix continued, CVP down to 15 this afternoon.  Continue Lasix at 80 mg IV every 8 hrs.   He is primarily in NSR on amiodarone, short runs of SVT.   He is down to norepinephrine at 1.   LFTs coming down.   Creatinine trending down.   Will give feraheme today.   Wean sedation to assess mental status.  Once he has further recovery, will need right/left heart cath.   Loralie Champagne 07/17/2017 2:02 PM

## 2017-07-17 NOTE — Progress Notes (Signed)
Noted left arm edematous and warm; no IVs in  Left arm; BP cuff moved to left leg; left arm elevated MD notified; restricted extremity band placed on arm. Will continue to monitor  Clyda Hurdle RN

## 2017-07-17 NOTE — Progress Notes (Signed)
[facility] Progress Note Patient Name: Brandon [name] DOB: [birth year] MRN: [id number]   Date of Service  07/17/2017  HPI/Events of Note  Lt arm swollen  eICU Interventions  Order duplex US     Intervention Category Intermediate Interventions: Other:  Aadin Gaut 07/17/2017, 10:03 PM

## 2017-07-17 NOTE — Progress Notes (Signed)
Nutrition Follow-up  DOCUMENTATION CODES:   Obesity unspecified  INTERVENTION:   Increase Vital High Protein to 15 ml/hr Continue 60 ml Prostat five times per day Provides: 1360 kcal, 181 grams protein, and 300 ml free water.   TF regimen and propofol at current rate providing 1542 total kcal/day   NUTRITION DIAGNOSIS:   Inadequate oral intake related to inability to eat as evidenced by NPO status. Ongoing.   GOAL:   Provide needs based on ASPEN/SCCM guidelines Met.   MONITOR:   I & O's, Vent status  ASSESSMENT:   Pt with PMH of smoker since age 22, HTN, PVD was admitted 8/2 with SOB and AF w RVR. Pt developed respiratory failure and was intubated 8/3.   Spoke with RN and family at bedside. Wife appreciative of nutrition and is familiar with enteral nutrition support. No bm yet, wife aware.  Patient is currently intubated on ventilator support  Propofol: 6.9 ml/hr provides: 182 kcal per day Medications reviewed and include: colace, lasix, SSI, solumedrol, miralax, KCl 40 mEq BID, norepinephrine 1 mcg Labs reviewed: Magnesium 2.8 (H), ALT: 2435 (H) CBG's: 164-180  TF via OGT: Vital High Protein @ 10 ml/hr 60 ml Prostat five times per day Provides: 1240 kcal, 171 grams protein, and 200 ml free water.   Diet Order:  Diet NPO time specified  Skin:  Reviewed, no issues  Last BM:  unknown  Height:   Ht Readings from Last 1 Encounters:  07/14/17 '6\' 2"'  (1.88 m)    Weight:   Wt Readings from Last 1 Encounters:  07/17/17 255 lb 15.3 oz (116.1 kg)    Ideal Body Weight:  86.3 kg  BMI:  Body mass index is 32.86 kg/m.  Estimated Nutritional Needs:   Kcal:  6754-4920  Protein:  >/= 172 grams  Fluid:  >2 L/day  EDUCATION NEEDS:   No education needs identified at this time  [place], [place], [place] Pager [phone] After Hours Pager

## 2017-07-17 NOTE — Care Management Note (Addendum)
Case Management Note  Patient Details  Name: Brandon [name] MRN: [id number] Date of Birth: 09-Mar-1946  Subjective/Objective:     Pt admitted with SOB - required intubation             Action/Plan:   PTA from home with wife - pt has recent rotator cuff surgery.  Pt remains intubated.  CM will continue to follow for discharge needs   Expected Discharge Date:                  Expected Discharge Plan:  Home/Self Care  In-House Referral:     Discharge planning Services  CM Consult  Post Acute Care Choice:    Choice offered to:     DME Arranged:    DME Agency:     HH Arranged:    HH Agency:     Status of Service:     If discussed at [employer] of Stay Meetings, dates discussed:    Additional Comments: 07/19/2017 Pt remains on the vent with wife at bedside.  Pt was completely independent prior to PTA.  CM will continue to follow for discharge needs Maryclare Labrador, RN 07/17/2017, 10:24 AM

## 2017-07-17 NOTE — Progress Notes (Signed)
CRITICAL VALUE ALERT  Critical Value:  Lactic 2.1   Date & Time Notied:  07/17/17 0520  Provider Notified:   Orders Received/Actions taken: Consistent with previously called value. Will continue to closely monitor. Richarda Blade RN

## 2017-07-17 NOTE — Progress Notes (Signed)
PULMONARY / CRITICAL CARE MEDICINE   Name: Brandon [name] MRN: [id number] DOB: [birth year]    ADMISSION DATE:  07/13/2017 CONSULTATION DATE:  07/14/17 REFERRING MD:  Dr. Acie Fredrickson   CHIEF COMPLAINT:  Respiratory distress  BRIEF: 71 y/o heavy smoked admitted with acute respiratory failure with hypoxemia in setting of afib with RVR and volume overload.  Had confusion not long after admission, hypotensive with precedex.  Has acute on chronic renal failure. Intubated for respiratory failure on 8/3.   SUBJECTIVE:  Constipated Remains on low dose levophed Oliguric but foley ?clogged?  VITAL SIGNS: BP (!) 121/59   Pulse 90   Temp 97.9 F (36.6 C) (Axillary)   Resp 20   Ht 6\' 2"  (1.88 m) Comment: measured  Wt 116.1 kg (255 lb 15.3 oz)   SpO2 99%   BMI 32.86 kg/m   HEMODYNAMICS: CVP:  [5 mmHg-14 mmHg] 14 mmHg  VENTILATOR SETTINGS: Vent Mode: PRVC FiO2 (%):  [50 %] 50 % Set Rate:  [20 bmp] 20 bmp Vt Set:  [660 mL-670 mL] 660 mL PEEP:  [5 cmH20] 5 cmH20 Plateau Pressure:  [19 cmH20-22 cmH20] 20 cmH20  INTAKE / OUTPUT: I/O last 3 completed shifts: In: 6158.8 [I.V.:4171.1; Other:625; NG/GT:812.7; IV Piggyback:550] Out: 6314 [Urine:3220; Emesis/NG output:200]   PHYSICAL EXAMINATION:  General:  Obese male in bed on vent HENT: NCAT ETT in place PULM: Crackles bases B, vent supported breathing CV: RRR, slight systolic murmur GI: BS+, soft, nontender MSK: normal bulk and tone Neuro: sedated on vent    LABS:  BMET  Recent Labs Lab 07/15/17 1518 07/16/17 0346 07/17/17 0405  NA 142 141 144  K 4.3 2.9* 3.6  CL 105 104 105  CO2 22 27 27   BUN 61* 58* 66*  CREATININE 2.65* 2.24* 1.98*  GLUCOSE 225* 190* 174*    Electrolytes  Recent Labs Lab 07/14/17 0322  07/15/17 1518 07/16/17 0346 07/17/17 0405  CALCIUM 8.5*  < > 7.6* 7.7* 8.0*  MG 2.2  --  2.4  --  2.6*  < > = values in this interval not displayed.  CBC  Recent Labs Lab 07/15/17 0819 07/16/17 0415  07/17/17 0943  WBC 15.4* 16.6* 15.2*  HGB 7.7* 7.8* 8.3*  HCT 24.7* 23.8* 26.4*  PLT 209 154 149*    Coag's  Recent Labs Lab 07/14/17 0422 07/16/17 0346 07/17/17 0405  INR 1.29 2.00 1.67    Sepsis Markers  Recent Labs Lab 07/15/17 0819 07/15/17 1518 07/17/17 0405  LATICACIDVEN 9.1* 3.6* 2.1*    ABG  Recent Labs Lab 07/15/17 0400 07/15/17 0902 07/16/17 0415  PHART 7.037* 7.404 7.548*  PCO2ART 49.6* 27.0* 29.6*  PO2ART 128* 85.0 59.0*    Liver Enzymes  Recent Labs Lab 07/14/17 2227 07/16/17 0346 07/17/17 0405  AST 3,930* 2,620* 804*  ALT 2,416* 3,363* 2,435*  ALKPHOS 60 64 66  BILITOT 1.5* 0.9 0.8  ALBUMIN 3.0* 2.8* 2.8*    Cardiac Enzymes  Recent Labs Lab 07/15/17 0819 07/15/17 1518 07/15/17 2142  TROPONINI 0.74* 0.86* 0.87*    Glucose  Recent Labs Lab 07/16/17 1155 07/16/17 1605 07/16/17 1947 07/17/17 0000 07/17/17 0336 07/17/17 0833  GLUCAP 174* 190* 187* 187* 164* 180*    Imaging Dg Chest Port 1 View  Result Date: 07/17/2017 CLINICAL DATA:  Respiratory failure EXAM: PORTABLE CHEST 1 VIEW COMPARISON:  07/16/2017 FINDINGS: Tubular devices are stable. Cardiomegaly is stable. Bilateral central and basilar opacity is likely a combination of pleural fluid and airspace disease. It is not  significantly changed. No pneumothorax. IMPRESSION: Stable bilateral pleural effusions and bilateral airspace disease. Electronically Signed   By: Marybelle Killings M.D.   On: 07/17/2017 07:25     STUDIES:  04/27/17  ECHO >> LV moderately dilated, LVEF 55-60%, moderate AS, mild to mod AR, moderate to severe MR, LA moderately dilated, PA Peak pressure 32.   CULTURES: Blood 8/3 >>  Respiratory 8/3 >>  MRSA screen 8/2 >> negative  ANTIBIOTICS: Vanco 8/3 >> 8/5 Zosyn 8/3 >> 8/6  SIGNIFICANT EVENTS: 8/02  Admit with SOB, [facility] 8/03  PCCM consulted for evaluation of respiratory distress, intubated  LINES/TUBES: ET tube 8/3 >>  CVC 8/3 >>    DISCUSSION: 71 y/o M, smoker, with PMH of HTN, PVD admitted on 8/2 with SOB and AF w RVR.  Intubated on 8/3 in setting of agitation, respiratory failure and bradycardia/hypotension on precedex.   ASSESSMENT / PLAN:  PULMONARY A: Acute respiratory failure with hypoxemia Tobacco abuse Possible COPD with acute exacerbation Acute pulmonary edema  P:   Full mechanical vent support VAP prevention Daily WUA/SBT Wean solumedrol Continue bronchodilators Continue diuresis  CARDIOVASCULAR A:  Cardiogenic shock Afib with RVR Acute decompensated diastolic heart failure Hx AS/AR, MR Bradycardia on precedex P:  Tele Wean off vasopressors No precedex LHC per cardiology  RENAL A:   AKI P:   Monitor BMET and UOP Replace electrolytes as needed Continue diuresis with lasix Replace Foley today  GASTROINTESTINAL A:   Shock liver > LFT's improving Stress ulcer prophylaxis Nutrition P:   Continue tube feedings Repeat LFT tomorrow  HEMATOLOGIC A:   Anemia  P:  Monitor for bleeding Continue prophylactic lovenox  INFECTIOUS A:   No evidence of pneumonia P:   Stop antibiotics Follow cultures  ENDOCRINE A:   Hyperglycemia P:   Continue SSI  NEUROLOGIC A:   Acute Metabolic Encephalopathy - > improving History of alcohol use, at risk withdrawal P:   RASS goal -1 Daily wake up assessment PAD protocol with propofol, fentanyl   FAMILY  - Updates: wife updated bedside 8/6  - Inter-disciplinary family meet or Palliative Care meeting due by: 8/10  My cc time 33 minutes  Roselie Awkward, MD [facility] Pager: [phone] Cell: [phone] After 3pm or if no response, call [phone]

## 2017-07-18 ENCOUNTER — Inpatient Hospital Stay (HOSPITAL_COMMUNITY): Payer: Medicare HMO

## 2017-07-18 DIAGNOSIS — I5033 Acute on chronic diastolic (congestive) heart failure: Secondary | ICD-10-CM

## 2017-07-18 DIAGNOSIS — J9601 Acute respiratory failure with hypoxia: Secondary | ICD-10-CM

## 2017-07-18 DIAGNOSIS — J810 Acute pulmonary edema: Secondary | ICD-10-CM

## 2017-07-18 DIAGNOSIS — I4891 Unspecified atrial fibrillation: Secondary | ICD-10-CM

## 2017-07-18 DIAGNOSIS — I5031 Acute diastolic (congestive) heart failure: Secondary | ICD-10-CM

## 2017-07-18 DIAGNOSIS — M7989 Other specified soft tissue disorders: Secondary | ICD-10-CM

## 2017-07-18 LAB — CBC
HCT: 27.3 % — ABNORMAL LOW (ref 39.0–52.0)
Hemoglobin: 8.6 g/dL — ABNORMAL LOW (ref 13.0–17.0)
MCH: 30.1 pg (ref 26.0–34.0)
MCHC: 31.5 g/dL (ref 30.0–36.0)
MCV: 95.5 fL (ref 78.0–100.0)
PLATELETS: 188 10*3/uL (ref 150–400)
RBC: 2.86 MIL/uL — ABNORMAL LOW (ref 4.22–5.81)
RDW: 14.8 % (ref 11.5–15.5)
WBC: 16 10*3/uL — ABNORMAL HIGH (ref 4.0–10.5)

## 2017-07-18 LAB — BASIC METABOLIC PANEL
Anion gap: 11 (ref 5–15)
BUN: 86 mg/dL — AB (ref 6–20)
CALCIUM: 8.6 mg/dL — AB (ref 8.9–10.3)
CHLORIDE: 108 mmol/L (ref 101–111)
CO2: 28 mmol/L (ref 22–32)
CREATININE: 1.74 mg/dL — AB (ref 0.61–1.24)
GFR calc Af Amer: 44 mL/min — ABNORMAL LOW (ref >60)
GFR calc non Af Amer: 38 mL/min — ABNORMAL LOW (ref >60)
GLUCOSE: 142 mg/dL — AB (ref 65–99)
Potassium: 3.9 mmol/L (ref 3.5–5.1)
Sodium: 147 mmol/L — ABNORMAL HIGH (ref 135–145)

## 2017-07-18 LAB — PROTIME-INR
INR: 1.65
PROTHROMBIN TIME: 19.7 s — AB (ref 11.4–15.2)

## 2017-07-18 LAB — GLUCOSE, CAPILLARY
GLUCOSE-CAPILLARY: 144 mg/dL — AB (ref 65–99)
GLUCOSE-CAPILLARY: 158 mg/dL — AB (ref 65–99)
GLUCOSE-CAPILLARY: 167 mg/dL — AB (ref 65–99)
Glucose-Capillary: 145 mg/dL — ABNORMAL HIGH (ref 65–99)
Glucose-Capillary: 153 mg/dL — ABNORMAL HIGH (ref 65–99)
Glucose-Capillary: 176 mg/dL — ABNORMAL HIGH (ref 65–99)
Glucose-Capillary: 182 mg/dL — ABNORMAL HIGH (ref 65–99)

## 2017-07-18 LAB — HEPATIC FUNCTION PANEL
ALBUMIN: 2.8 g/dL — AB (ref 3.5–5.0)
ALT: 1799 U/L — ABNORMAL HIGH (ref 17–63)
AST: 294 U/L — AB (ref 15–41)
Alkaline Phosphatase: 66 U/L (ref 38–126)
BILIRUBIN DIRECT: 0.3 mg/dL (ref 0.1–0.5)
BILIRUBIN INDIRECT: 0.7 mg/dL (ref 0.3–0.9)
Total Bilirubin: 1 mg/dL (ref 0.3–1.2)
Total Protein: 5.5 g/dL — ABNORMAL LOW (ref 6.5–8.1)

## 2017-07-18 LAB — HEPARIN LEVEL (UNFRACTIONATED): Heparin Unfractionated: 0.33 IU/mL (ref 0.30–0.70)

## 2017-07-18 LAB — MAGNESIUM: Magnesium: 2.6 mg/dL — ABNORMAL HIGH (ref 1.7–2.4)

## 2017-07-18 LAB — OCCULT BLOOD X 1 CARD TO LAB, STOOL: FECAL OCCULT BLD: NEGATIVE

## 2017-07-18 LAB — TRIGLYCERIDES: Triglycerides: 150 mg/dL — ABNORMAL HIGH (ref <150)

## 2017-07-18 MED ORDER — AMIODARONE LOAD VIA INFUSION
150.0000 mg | Freq: Once | INTRAVENOUS | Status: AC
  Administered 2017-07-18: 150 mg via INTRAVENOUS

## 2017-07-18 MED ORDER — AMIODARONE HCL IN DEXTROSE 360-4.14 MG/200ML-% IV SOLN
60.0000 mg/h | INTRAVENOUS | Status: AC
  Administered 2017-07-18: 60 mg/h via INTRAVENOUS
  Filled 2017-07-18 (×2): qty 200

## 2017-07-18 MED ORDER — AMIODARONE HCL IN DEXTROSE 360-4.14 MG/200ML-% IV SOLN
30.0000 mg/h | INTRAVENOUS | Status: DC
  Administered 2017-07-18: 30 mg/h via INTRAVENOUS
  Filled 2017-07-18: qty 200

## 2017-07-18 MED ORDER — AMIODARONE HCL IN DEXTROSE 360-4.14 MG/200ML-% IV SOLN: 30.0000 mg/h | INTRAVENOUS | Status: DC

## 2017-07-18 MED ORDER — HEPARIN (PORCINE) IN NACL 100-0.45 UNIT/ML-% IJ SOLN
1300.0000 [IU]/h | INTRAMUSCULAR | Status: DC
  Administered 2017-07-18 – 2017-07-22 (×5): 1200 [IU]/h via INTRAVENOUS
  Administered 2017-07-23 – 2017-07-24 (×2): 1150 [IU]/h via INTRAVENOUS
  Administered 2017-07-25: 1200 [IU]/h via INTRAVENOUS
  Administered 2017-07-26: 1300 [IU]/h via INTRAVENOUS
  Filled 2017-07-18 (×10): qty 250

## 2017-07-18 MED ORDER — AMIODARONE HCL IN DEXTROSE 360-4.14 MG/200ML-% IV SOLN
30.0000 mg/h | INTRAVENOUS | Status: DC
  Administered 2017-07-18 – 2017-07-19 (×2): 30 mg/h via INTRAVENOUS
  Administered 2017-07-19: 60 mg/h via INTRAVENOUS
  Administered 2017-07-19 (×3): 30 mg/h via INTRAVENOUS
  Administered 2017-07-20 (×2): 60 mg/h via INTRAVENOUS
  Administered 2017-07-20 – 2017-07-22 (×7): 30 mg/h via INTRAVENOUS
  Administered 2017-07-23 – 2017-07-24 (×3): 60 mg/h via INTRAVENOUS
  Administered 2017-07-24: 30 mg/h via INTRAVENOUS
  Administered 2017-07-24: 60 mg/h via INTRAVENOUS
  Administered 2017-07-25 – 2017-07-26 (×3): 30 mg/h via INTRAVENOUS
  Filled 2017-07-18 (×21): qty 200

## 2017-07-18 NOTE — Plan of Care (Signed)
Problem: Bowel/Gastric: Goal: Will not experience complications related to bowel motility Outcome: Not Progressing Constipation; laxatives administered

## 2017-07-18 NOTE — Progress Notes (Signed)
07/18/2017 1030 Pt. Noted to be in SVT rate sustained 180-190. Pt. Extremely agitated. PRN fentanyl bolus administered per orders. Karen Kays NP with CCM on floor and verbal order received to administer 150 mg Amiodarone bolus and prn metoprolol 2.5 mg IV x1. Orders enacted. Brandon [name], [employer] 10:52 AM Pt. Calm/resting at this time. HR still elevated, now A-fib rate 120-150. Dr. Lake Bells on floor to evaluate patient. Will continue to monitor patient closely.

## 2017-07-18 NOTE — Progress Notes (Signed)
07/18/2017 4:37 PM Brandon Ninfa Meeker NP on floor and made aware of continued AFIB RVR 120-130 and current gtt rates. Verbal order received to keep Amiodarone gtt at 60 ml/hr and not decrease as ordered prior at 1715. Orders enacted, will continue to closely monitor patient.  [name], [name]

## 2017-07-18 NOTE — Progress Notes (Signed)
*  PRELIMINARY RESULTS* Vascular Ultrasound Left upper extremity venous duplex has been completed.  Preliminary findings: no evidence of DVT. Superficial thrombosis noted in the left basilic vein.   Landry Mellow, RDMS, RVT  07/18/2017, 10:40 AM

## 2017-07-18 NOTE — Progress Notes (Signed)
Pt placed on SBT 5/5.  Pt did not tolerate due to decreased respiratory rate and decreased tidal volume.  PS increased to 10.  Pt tolerating at this time but slightly agitated.  RT will continue to monitor.

## 2017-07-18 NOTE — Progress Notes (Signed)
Peculiar for heparin Indication: atrial fibrillation  Allergies  Allergen Reactions  . Bee Venom Anaphylaxis and Swelling    Patient Measurements: Height: 6\' 2"  (188 cm) (measured) Weight: 252 lb 3.3 oz (114.4 kg) IBW/kg (Calculated) : 82.2   Vital Signs: Temp: 98.2 F (36.8 C) (08/07 1950) Temp Source: Oral (08/07 1950) BP: 85/56 (08/07 1942) Pulse Rate: 116 (08/07 1942)  Labs:  Recent Labs  07/15/17 2142 07/16/17 0346  07/16/17 0415 07/17/17 0405 07/17/17 0943 07/18/17 0900 07/18/17 0945 07/18/17 1838  HGB  --   --   < > 7.8*  --  8.3* 8.6*  --   --   HCT  --   --   --  23.8*  --  26.4* 27.3*  --   --   PLT  --   --   --  154  --  149* 188  --   --   LABPROT  --  23.0*  --   --  19.9*  --   --  19.7*  --   INR  --  2.00  --   --  1.67  --   --  1.65  --   HEPARINUNFRC  --   --   --   --   --   --   --   --  0.33  CREATININE  --  2.24*  --   --  1.98*  --  1.74*  --   --   TROPONINI 0.87*  --   --   --   --   --   --   --   --   < > = values in this interval not displayed.  Estimated Creatinine Clearance: 52.4 mL/min (A) (by C-G formula based on SCr of 1.74 mg/dL (H)).   Medical History: Past Medical History:  Diagnosis Date  . Anemia 07/13/2017  . Anxiety   . Aortic insufficiency   . Aortic stenosis, moderate 07/13/2017  . Arthritis    back   . Carotid stenosis    Right carotid stent (widely patent) 40 - 59% left plaque 11/13  . Depression   . Dyslipidemia   . GERD (gastroesophageal reflux disease)   . Heart murmur   . Hemiplegia affecting unspecified side, late effect of cerebrovascular disease    resolved- from L side   . Hypertension   . Jaundice    resolved following ERCP & Cholecystectomy  . Mitral valve insufficiency and aortic valve insufficiency   . Pre-diabetes    per spouse  . Sleep apnea    does not wear CPAP  . Sleep concern    resulted in surgery- after + sleep test. Pt. doesn't have a problem  any longer.   . Stroke ([place]) 03/11/2003   stent placed on the 31, 3, 2004, L side   . Wears glasses   . Wears hearing aid in both ears   . Wears partial dentures    Assessment: 71 y.o. male with a hx of CVA, HTN, HL, AS, MR, [name], tobacco use, remote hx colon polyps,admitted for the evaluation of CP, diaphoresis, elevated troponin.  Patient now with atrial fibrillation, he has ruled out for GI bleed, will start IV heparin (without bolus).   Evening heparin level is therapeutic.  Goal of Therapy:  Heparin level 0.3-0.5 units/ml - will aim for lower goal with low anemia Monitor platelets by anticoagulation protocol: Yes   Plan:  -Continue heparin at  1200 units/hr -Daily HL, CBC    Hughes Better, PharmD, BCPS Clinical Pharmacist 07/18/2017 8:28 PM

## 2017-07-18 NOTE — Progress Notes (Signed)
[place] for heparin Indication: atrial fibrillation  Allergies  Allergen Reactions  . Bee Venom Anaphylaxis and Swelling    Patient Measurements: Height: 6\' 2"  (188 cm) (measured) Weight: 252 lb 3.3 oz (114.4 kg) IBW/kg (Calculated) : 82.2   Vital Signs: Temp: 97.8 F (36.6 C) (08/07 0315) Temp Source: Oral (08/07 0315) BP: 116/68 (08/07 0830) Pulse Rate: 86 (08/07 0830)  Labs:  Recent Labs  07/15/17 1518 07/15/17 2142 07/16/17 0346 07/16/17 0415 07/17/17 0405 07/17/17 0943  HGB  --   --   --  7.8*  --  8.3*  HCT  --   --   --  23.8*  --  26.4*  PLT  --   --   --  154  --  149*  LABPROT  --   --  23.0*  --  19.9*  --   INR  --   --  2.00  --  1.67  --   CREATININE 2.65*  --  2.24*  --  1.98*  --   TROPONINI 0.86* 0.87*  --   --   --   --     Estimated Creatinine Clearance: 46 mL/min (A) (by C-G formula based on SCr of 1.98 mg/dL (H)).   Medical History: Past Medical History:  Diagnosis Date  . Anemia 07/13/2017  . Anxiety   . Aortic insufficiency   . Aortic stenosis, moderate 07/13/2017  . Arthritis    back   . Carotid stenosis    Right carotid stent (widely patent) 40 - 59% left plaque 11/13  . Depression   . Dyslipidemia   . GERD (gastroesophageal reflux disease)   . Heart murmur   . Hemiplegia affecting unspecified side, late effect of cerebrovascular disease    resolved- from L side   . Hypertension   . Jaundice    resolved following ERCP & Cholecystectomy  . Mitral valve insufficiency and aortic valve insufficiency   . Pre-diabetes    per spouse  . Sleep apnea    does not wear CPAP  . Sleep concern    resulted in surgery- after + sleep test. Pt. doesn't have a problem any longer.   . Stroke ([place]) 03/11/2003   stent placed on the 31, 3, 2004, L side   . Wears glasses   . Wears hearing aid in both ears   . Wears partial dentures    Assessment: 71 y.o. male with a hx of CVA, HTN, HL, AS, MR, mild LV  dysfunction, tobacco use, remote hx colon polyps,admitted for the evaluation of CP, diaphoresis, elevated troponin.  Patient now with atrial fibrillation, he has ruled out for GI bleed, will start IV heparin (without bolus).   Hgb low but stable, INR 1.6 (stable), has been receiving sq lovenox.  Goal of Therapy:  Heparin level 0.3-0.5 units/ml - will aim for lower goal with low anemia Monitor platelets by anticoagulation protocol: Yes   Plan:  Start heparin infusion at 1200 units/hr Check anti-Xa level in 6 hours and daily while on heparin Continue to monitor H&H and platelets  Erin Hearing PharmD., BCPS Clinical Pharmacist Pager [phone] 07/18/2017 1:42 PM

## 2017-07-18 NOTE — Progress Notes (Signed)
Patient ID: Brandon [name], male   DOB: [birth year], 71 y.o.   MRN: [id number]     Advanced Heart Failure Rounding Note   Subjective:    8/3 developed progressive respiratory distress => Bipap => intubation.  With sedation, became hypotensive and bradycardic, dopamine + phenylephrine started.  Developed AKI. Hyperkalemia treated with HCO3 gtt and Kayexalate.  Also with metabolic acidosis with elevated lactate (HCO3 gtt begun).  LFTs rose to the 1000s range.  CXR bilateral pleural effusion.    Vancomycin/Zosyn started empirically (?PNA component) => now off abx.  Solumedrol started empirically, ?COPD exacerbation component.    Off norepinephrine this morning, BP stable.   CVP 16 today, diuresed well yesterday.  Did not tolerate vent wean this morning but may have been too sedated.  No labs yet.  Still with runs SVT on po amiodarone.   TEE (5/18): EF 55-60%, moderate AS, moderate AI, moderate MR.   TTE (8/18): EF 55-60%, moderate LVH, moderate AS mean 33, moderate AI, moderate-severe MR  Objective:   Weight Range: 252 lb 3.3 oz (114.4 kg) Body mass index is 32.38 kg/m.   Vital Signs:   Temp:  [97.6 F (36.4 C)-98.2 F (36.8 C)] 97.8 F (36.6 C) (08/07 0315) Pulse Rate:  [32-129] 86 (08/07 0830) Resp:  [17-22] 20 (08/07 0830) BP: (107-180)/(50-131) 116/68 (08/07 0830) SpO2:  [92 %-100 %] 97 % (08/07 0830) FiO2 (%):  [40 %] 40 % (08/07 0810) Weight:  [252 lb 3.3 oz (114.4 kg)] 252 lb 3.3 oz (114.4 kg) (08/07 0422)    Weight change: Filed Weights   07/16/17 0345 07/17/17 0515 07/18/17 0422  Weight: 254 lb 6.6 oz (115.4 kg) 255 lb 15.3 oz (116.1 kg) 252 lb 3.3 oz (114.4 kg)    Intake/Output:   Intake/Output Summary (Last 24 hours) at 07/18/17 0849 Last data filed at 07/18/17 0800  Gross per 24 hour  Intake           1564.8 ml  Output             5125 ml  Net          -3560.2 ml      Physical Exam  CVP 16  General:  Intubated/sedated HEENT: normal Neck: supple.  JVP 14-16.  No lymphadenopathy or thryomegaly appreciated. Cor: PMI nondisplaced. Irregular rate & rhythm. No rubs, gallops.  2/6 SEM RUSB Lungs: crackles at bases Abdomen: soft, nontender, nondistended. No hepatosplenomegaly. No bruits or masses. Good bowel sounds. Extremities: no cyanosis, clubbing, rash, edema Neuro: sedated.  Telemetry   Personally reviewed. NSR with runs SVT  Labs    CBC  Recent Labs  07/16/17 0415 07/17/17 0943  WBC 16.6* 15.2*  HGB 7.8* 8.3*  HCT 23.8* 26.4*  MCV 90.8 94.0  PLT 154 233*   Basic Metabolic Panel  Recent Labs  07/15/17 1518 07/16/17 0346 07/17/17 0405  NA 142 141 144  K 4.3 2.9* 3.6  CL 105 104 105  CO2 _0 GLUCOSE 225* 190* 174*  BUN 61* 58* 66*  CREATININE 2.65* 2.24* 1.98*  CALCIUM 7.6* 7.7* 8.0*  MG 2.4  --  2.6*   Liver Function Tests  Recent Labs  07/17/17 0405 07/18/17 0440  AST 804* 294*  ALT 2,435* 1,799*  ALKPHOS 66 66  BILITOT 0.8 1.0  PROT 5.7* 5.5*  ALBUMIN 2.8* 2.8*   No results for input(s): LIPASE, AMYLASE in the last 72 hours. Cardiac Enzymes  Recent Labs  07/15/17 1518 07/15/17 2142  TROPONINI 0.86* 0.87*    BNP: BNP (last 3 results)  Recent Labs  07/13/17 1341  BNP 408.4*    ProBNP (last 3 results) No results for input(s): PROBNP in the last 8760 hours.   D-Dimer No results for input(s): DDIMER in the last 72 hours. Hemoglobin A1C No results for input(s): HGBA1C in the last 72 hours. Fasting Lipid Panel  Recent Labs  07/18/17 0730  TRIG 150*   Thyroid Function Tests No results for input(s): TSH, T4TOTAL, T3FREE, THYROIDAB in the last 72 hours.  Invalid input(s): FREET3  Other results:   Imaging    No results found.   Medications:     Scheduled Medications: . amiodarone  400 mg Oral BID  . arformoterol  15 mcg Nebulization BID  . budesonide (PULMICORT) nebulizer solution  0.5 mg Nebulization BID  . chlorhexidine gluconate (MEDLINE KIT)  15 mL  Mouth Rinse BID  . Chlorhexidine Gluconate Cloth  6 each Topical Daily  . docusate  100 mg Oral BID  . enoxaparin (LOVENOX) injection  40 mg Subcutaneous Q24H  . feeding supplement (PRO-STAT SUGAR FREE 64)  60 mL Per Tube 5 X Daily  . feeding supplement (VITAL HIGH PROTEIN)  1,000 mL Per Tube Q24H  . fentaNYL (SUBLIMAZE) injection  50 mcg Intravenous Once  . furosemide  80 mg Intravenous TID  . insulin aspart  0-20 Units Subcutaneous Q4H  . ipratropium-albuterol  3 mL Nebulization Q6H  . mouth rinse  15 mL Mouth Rinse QID  . methylPREDNISolone (SOLU-MEDROL) injection  40 mg Intravenous Daily  . pantoprazole (PROTONIX) IV  40 mg Intravenous Q24H  . polyethylene glycol  17 g Oral Daily  . sodium chloride flush  10-40 mL Intracatheter Q12H  . sodium chloride flush  3 mL Intravenous Q12H  . sodium chloride flush  3 mL Intravenous Q12H    Infusions: . sodium chloride    . sodium chloride    . sodium chloride 10 mL/hr at 07/18/17 0800  . fentaNYL infusion INTRAVENOUS 50 mcg/hr (07/18/17 0800)  . ferumoxytol Stopped (07/17/17 1017)  . norepinephrine (LEVOPHED) Adult infusion Stopped (07/18/17 0800)  . propofol (DIPRIVAN) infusion 15 mcg/kg/min (07/18/17 0800)    PRN Medications: sodium chloride, sodium chloride, acetaminophen, fentaNYL, metoprolol tartrate, midazolam, nitroGLYCERIN, ondansetron (ZOFRAN) IV, senna, sodium chloride flush, sodium chloride flush, sodium chloride flush    Patient Profile   71 yo with valvular heart disease and smoking/suspected COPD, CVA with carotid stenting was admitted with dyspnea/respiratory distress.   Assessment/Plan   1. Acute hypoxemic respiratory failure: Suspect this is due to pulmonary edema (noted on CXR).  He was empirically on antibiotics for ?PNA but these were stopped yesterday and Solumedrol for component of COPD exacerbation. Failed vent wean this morning but will try again when less sedated.  2. Acute on chronic diastolic CHF:  Baseline has at least moderate valvular disease (moderate AS, moderate AI, moderate-severe MR).  Suspect diastolic CHF triggered by poorly tolerated paroxysmal atrial fibrillation in setting of valvular disease.  Admission thought to be triggered by diastolic CHF/pulmonary edema.   He developed AKI with lactic acidosis in the setting of hypotension with sedation.  Now off norepinephrine and diuresing well though CVP remains high at 16.   - Continue Lasix 80 mg IV every 8 hrs.   - Eventually needs left/right heart cath, but want to see renal recovery first.  3. Shock: Probably mixed cardiogenic/vasodilatory.  Developed in the setting of sedation with Bipap then intubation as well as  bradycardia with sedation. Now off norepinephrine.  4. AKI: With hyperkalemia (treated) in setting of shock. Suspect ATN.  Creatinine coming down => needs BMET today.   5. Elevated LFTs: Up to 1000s.  Suspect shock liver in setting of hypotension.  LFT coming down.   6. Atrial fibrillation: Paroxysmal.  Seems to tolerate poorly.  Overnight still with SVT runs (regular, ?AT or flutter). LFTs elevated but probably due to shock liver. LFTs trending down.  - Will make amiodarone IV to try to settle down SVT.   - See below, FOBT negative.  Will start heparin gtt to see how he tolerates with no overt bleeding.  7. Anemia: Recent fall in hgb to 8s range.  FOBT negative yesterday.  Fe deficient, got feraheme.  No overt GI bleeding.  - PPI - Will eventually need GI workup.  - With FOBT negative will start heparin to see how he tolerates given no overt bleeding.  8. ID: Blx CX NGTD. No definite infection but covered initially with empiric abx for possible PNA => stopped 8/6. CBC pending.    9. COPD: Suspected.  Active smoker.  On empiric steroids.  10. Valvular heart disease: Echo this admission with moderate AS, moderate AI, moderate to severe MR.  May need reassessment with TEE.  May eventually need consideration of surgical valve  repair/replacement with [place] given poorly tolerated afib.   Length of Stay: [place], MD  07/18/2017, 8:49 AM  Advanced Heart Failure Team Pager [phone] (M-F; 7a - 4p)  Please contact [facility] for night-coverage after hours (4p -7a ) and weekends on [email or web address]

## 2017-07-18 NOTE — Progress Notes (Signed)
PULMONARY / CRITICAL CARE MEDICINE   Name: Brandon [name] MRN: [id number] DOB: 1946-12-12    ADMISSION DATE:  07/13/2017 CONSULTATION DATE:  07/14/17 REFERRING MD:  Dr. Acie Fredrickson   CHIEF COMPLAINT:  Respiratory distress  BRIEF: 71 y/o heavy smoked admitted with acute respiratory failure with hypoxemia in setting of afib with RVR and volume overload.  Had confusion not long after admission, hypotensive with precedex.  Has acute on chronic renal failure. Intubated for respiratory failure on 8/3.   SUBJECTIVE:  Off pressors.  Sedation down this AM to 73mcg Fentanyl, 67mcg Propofol.  Attempted PS wean this AM but only tolerated briefly as wasn't able to maintain adequate MV. Placed back on full support.  Sedation currently being weaned more to help facilitate repeat PS trial later today.  VITAL SIGNS: BP 116/68 (BP Location: Right Arm)   Pulse 86   Temp 97.8 F (36.6 C) (Oral)   Resp 20   Ht 6\' 2"  (1.88 m) Comment: measured  Wt 114.4 kg (252 lb 3.3 oz)   SpO2 97%   BMI 32.38 kg/m   HEMODYNAMICS: CVP:  [11 mmHg-16 mmHg] 15 mmHg  VENTILATOR SETTINGS: Vent Mode: PRVC FiO2 (%):  [40 %] 40 % Set Rate:  [20 bmp] 20 bmp Vt Set:  [250 mL] 660 mL PEEP:  [5 cmH20] 5 cmH20 Pressure Support:  [10 cmH20] 10 cmH20 Plateau Pressure:  [20 cmH20-26 cmH20] 21 cmH20  INTAKE / OUTPUT: I/O last 3 completed shifts: In: 3096.8 [I.V.:1759.8; NG/GT:1120; IV Piggyback:217] Out: 6195 [Urine:6195]   PHYSICAL EXAMINATION:  General:  Obese male in bed on vent, NAD HENT: NCAT, ETT in place, MMM PULM: Crackles bases B, vent supported breathing CV: RRR, slight systolic murmur GI: BS+, soft, nontender MSK: normal bulk and tone Neuro: sedated on vent   LABS:  BMET  Recent Labs Lab 07/16/17 0346 07/17/17 0405 07/18/17 0900  NA 141 144 147*  K 2.9* 3.6 3.9  CL 104 105 108  CO2 27 27 28   BUN 58* 66* 86*  CREATININE 2.24* 1.98* 1.74*  GLUCOSE 190* 174* 142*    Electrolytes  Recent  Labs Lab 07/14/17 0322  07/15/17 1518 07/16/17 0346 07/17/17 0405 07/18/17 0900  CALCIUM 8.5*  < > 7.6* 7.7* 8.0* 8.6*  MG 2.2  --  2.4  --  2.6*  --   < > = values in this interval not displayed.  CBC  Recent Labs Lab 07/16/17 0415 07/17/17 0943 07/18/17 0900  WBC 16.6* 15.2* 16.0*  HGB 7.8* 8.3* 8.6*  HCT 23.8* 26.4* 27.3*  PLT 154 149* 188    Coag's  Recent Labs Lab 07/14/17 0422 07/16/17 0346 07/17/17 0405  INR 1.29 2.00 1.67    Sepsis Markers  Recent Labs Lab 07/15/17 0819 07/15/17 1518 07/17/17 0405  LATICACIDVEN 9.1* 3.6* 2.1*    ABG  Recent Labs Lab 07/15/17 0400 07/15/17 0902 07/16/17 0415  PHART 7.037* 7.404 7.548*  PCO2ART 49.6* 27.0* 29.6*  PO2ART 128* 85.0 59.0*    Liver Enzymes  Recent Labs Lab 07/16/17 0346 07/17/17 0405 07/18/17 0440  AST 2,620* 804* 294*  ALT 3,363* 2,435* 1,799*  ALKPHOS 64 66 66  BILITOT 0.9 0.8 1.0  ALBUMIN 2.8* 2.8* 2.8*    Cardiac Enzymes  Recent Labs Lab 07/15/17 0819 07/15/17 1518 07/15/17 2142  TROPONINI 0.74* 0.86* 0.87*    Glucose  Recent Labs Lab 07/17/17 1210 07/17/17 1611 07/17/17 2052 07/17/17 2341 07/18/17 0442 07/18/17 0742  GLUCAP 171* 160* 150* 167* 145* 144*  Imaging No results found.   STUDIES:  04/27/17  ECHO >> LV moderately dilated, LVEF 55-60%, moderate AS, mild to mod AR, moderate to severe MR, LA moderately dilated, PA Peak pressure 32.  LUE duplex 8/7 >   CULTURES: Blood 8/3 >>  Respiratory 8/3 >>  MRSA screen 8/2 >> negative  ANTIBIOTICS: Vanco 8/3 >> 8/5 Zosyn 8/3 >> 8/6  SIGNIFICANT EVENTS: 8/02  Admit with SOB, [facility] 8/03  PCCM consulted for evaluation of respiratory distress, intubated  LINES/TUBES: ET tube 8/3 >>  CVC 8/3 >>   DISCUSSION: 71 y/o M, smoker, with PMH of HTN, PVD admitted on 8/2 with SOB and AF w RVR.  Intubated on 8/3 in setting of agitation, respiratory failure and bradycardia/hypotension on precedex.   ASSESSMENT  / PLAN:  PULMONARY A: Acute respiratory failure with hypoxemia Tobacco abuse Possible COPD with acute exacerbation Acute pulmonary edema P:   Full mechanical vent support - attempt PS again later today VAP prevention Daily WUA/SBT Continue solumedrol, transition to pred once taking PO then taper off Continue bronchodilators Continue diuresis CXR in AM  CARDIOVASCULAR A:  Cardiogenic shock Afib with RVR Acute decompensated diastolic heart failure Hx AS/AR, MR Bradycardia on precedex P:  Tele Continue to monitor off pressors No precedex LHC / RHC per cardiology once renal status improved  RENAL A:   AKI P:   Monitor BMET and UOP Replace electrolytes as needed Continue diuresis with lasix  GASTROINTESTINAL A:   Shock liver > LFT's improving Stress ulcer prophylaxis Nutrition P:   Continue tube feedings Repeat LFTs intermittently  HEMATOLOGIC A:   Anemia  P:  Monitor for bleeding Heparin SQ  INFECTIOUS A:   No evidence of pneumonia P:   Stop antibiotics Follow cultures  ENDOCRINE A:   Hyperglycemia P:   Continue SSI  NEUROLOGIC A:   Acute Metabolic Encephalopathy - > improving History of alcohol use, at risk withdrawal (though note, only drinks 1 beer every 3-4 days) P:   RASS goal -1 Daily wake up assessment PAD protocol with propofol, fentanyl   FAMILY  - Updates: wife updated bedside 8/6 and 8/7  - Inter-disciplinary family meet or Palliative Care meeting due by: 8/10  My cc time 30 minutes   Montey Hora, [place] - [facility] Pager: [phone]  or [phone] 07/18/2017, 10:33 AM

## 2017-07-18 NOTE — Progress Notes (Signed)
Pt placed back on full support.  He was not able to tolerated weaning.  RT will continue to monitor.

## 2017-07-18 NOTE — Progress Notes (Signed)
07/18/2017 1400 Brandon Clegg NP on floor and made aware of continued AFIB RVR rate 120-140 despite ordered amiodarone gtt and prn metoprolol push x 2 as well as need to restart Levophed to maintain MAP. No new orders received at this time. Will continue to closely monitor patient.  [name], [name]

## 2017-07-18 NOTE — Progress Notes (Signed)
07/18/2017 1700 Phlebotomy contacted and made aware of need for heparin level to be collected by LAB technician due to heparin infusing through central line. Will continue to closely monitor patient.  Thadius Smisek, [name]

## 2017-07-19 ENCOUNTER — Inpatient Hospital Stay (HOSPITAL_COMMUNITY): Payer: Medicare HMO

## 2017-07-19 LAB — BASIC METABOLIC PANEL
ANION GAP: 12 (ref 5–15)
BUN: 92 mg/dL — AB (ref 6–20)
CHLORIDE: 105 mmol/L (ref 101–111)
CO2: 30 mmol/L (ref 22–32)
Calcium: 9 mg/dL (ref 8.9–10.3)
Creatinine, Ser: 1.74 mg/dL — ABNORMAL HIGH (ref 0.61–1.24)
GFR calc Af Amer: 44 mL/min — ABNORMAL LOW (ref >60)
GFR calc non Af Amer: 38 mL/min — ABNORMAL LOW (ref >60)
GLUCOSE: 141 mg/dL — AB (ref 65–99)
POTASSIUM: 3.5 mmol/L (ref 3.5–5.1)
SODIUM: 147 mmol/L — AB (ref 135–145)

## 2017-07-19 LAB — GLUCOSE, CAPILLARY
GLUCOSE-CAPILLARY: 152 mg/dL — AB (ref 65–99)
GLUCOSE-CAPILLARY: 124 mg/dL — AB (ref 65–99)
GLUCOSE-CAPILLARY: 148 mg/dL — AB (ref 65–99)
GLUCOSE-CAPILLARY: 189 mg/dL — AB (ref 65–99)
Glucose-Capillary: 162 mg/dL — ABNORMAL HIGH (ref 65–99)

## 2017-07-19 LAB — CBC
HEMATOCRIT: 29.2 % — AB (ref 39.0–52.0)
HEMOGLOBIN: 9 g/dL — AB (ref 13.0–17.0)
MCH: 29.3 pg (ref 26.0–34.0)
MCHC: 30.8 g/dL (ref 30.0–36.0)
MCV: 95.1 fL (ref 78.0–100.0)
Platelets: 248 10*3/uL (ref 150–400)
RBC: 3.07 MIL/uL — ABNORMAL LOW (ref 4.22–5.81)
RDW: 15 % (ref 11.5–15.5)
WBC: 19.6 10*3/uL — AB (ref 4.0–10.5)

## 2017-07-19 LAB — CULTURE, BLOOD (ROUTINE X 2)
Culture: NO GROWTH
SPECIAL REQUESTS: ADEQUATE

## 2017-07-19 LAB — PHOSPHORUS: Phosphorus: 5.2 mg/dL — ABNORMAL HIGH (ref 2.5–4.6)

## 2017-07-19 LAB — HEPARIN LEVEL (UNFRACTIONATED): HEPARIN UNFRACTIONATED: 0.34 [IU]/mL (ref 0.30–0.70)

## 2017-07-19 LAB — MAGNESIUM: Magnesium: 2.5 mg/dL — ABNORMAL HIGH (ref 1.7–2.4)

## 2017-07-19 MED ORDER — PREDNISONE 5 MG/5ML PO SOLN
20.0000 mg | Freq: Every day | ORAL | Status: AC
  Administered 2017-07-19 – 2017-07-23 (×4): 20 mg
  Filled 2017-07-19 (×5): qty 20

## 2017-07-19 MED ORDER — DILTIAZEM HCL 100 MG IV SOLR
2.5000 mg/h | INTRAVENOUS | Status: DC
  Administered 2017-07-19: 15 mg/h via INTRAVENOUS
  Administered 2017-07-19: 2.5 mg/h via INTRAVENOUS
  Administered 2017-07-19: 15 mg/h via INTRAVENOUS
  Filled 2017-07-19 (×3): qty 100

## 2017-07-19 MED ORDER — CLONAZEPAM 0.5 MG PO TBDP
0.5000 mg | ORAL_TABLET | Freq: Two times a day (BID) | ORAL | Status: DC
  Administered 2017-07-19 (×2): 0.5 mg via ORAL
  Filled 2017-07-19 (×2): qty 1

## 2017-07-19 MED ORDER — POTASSIUM CHLORIDE 20 MEQ PO PACK
40.0000 meq | PACK | Freq: Once | ORAL | Status: AC
  Administered 2017-07-19: 40 meq via ORAL
  Filled 2017-07-19: qty 2

## 2017-07-19 MED ORDER — POTASSIUM CHLORIDE 20 MEQ/15ML (10%) PO SOLN
40.0000 meq | Freq: Once | ORAL | Status: DC
  Filled 2017-07-19: qty 30

## 2017-07-19 MED ORDER — AMIODARONE LOAD VIA INFUSION
150.0000 mg | Freq: Once | INTRAVENOUS | Status: AC
  Administered 2017-07-19: 150 mg via INTRAVENOUS
  Filled 2017-07-19: qty 83.34

## 2017-07-19 NOTE — Progress Notes (Deleted)
[place] for heparin Indication: atrial fibrillation  Allergies  Allergen Reactions  . Bee Venom Anaphylaxis and Swelling    Patient Measurements: Height: 6\' 2"  (188 cm) (measured) Weight: 244 lb 7.8 oz (110.9 kg) IBW/kg (Calculated) : 82.2   Vital Signs: Temp: 98.9 F (37.2 C) (08/08 1100) Temp Source: Oral (08/08 1100) BP: 117/85 (08/08 1700) Pulse Rate: 130 (08/08 1700)  Labs:  Recent Labs  07/17/17 0405  07/17/17 0943 07/18/17 0900 07/18/17 0945 07/18/17 1838 07/19/17 0332 07/19/17 0603  HGB  --   < > 8.3* 8.6*  --   --  9.0*  --   HCT  --   --  26.4* 27.3*  --   --  29.2*  --   PLT  --   --  149* 188  --   --  248  --   LABPROT 19.9*  --   --   --  19.7*  --   --   --   INR 1.67  --   --   --  1.65  --   --   --   HEPARINUNFRC  --   --   --   --   --  0.33  --  0.34  CREATININE 1.98*  --   --  1.74*  --   --  1.74*  --   < > = values in this interval not displayed.  Estimated Creatinine Clearance: 51.6 mL/min (A) (by C-G formula based on SCr of 1.74 mg/dL (H)).   Medical History: Past Medical History:  Diagnosis Date  . Anemia 07/13/2017  . Anxiety   . Aortic insufficiency   . Aortic stenosis, moderate 07/13/2017  . Arthritis    back   . Carotid stenosis    Right carotid stent (widely patent) 40 - 59% left plaque 11/13  . Depression   . Dyslipidemia   . GERD (gastroesophageal reflux disease)   . Heart murmur   . Hemiplegia affecting unspecified side, late effect of cerebrovascular disease    resolved- from L side   . Hypertension   . Jaundice    resolved following ERCP & Cholecystectomy  . Mitral valve insufficiency and aortic valve insufficiency   . Pre-diabetes    per spouse  . Sleep apnea    does not wear CPAP  . Sleep concern    resulted in surgery- after + sleep test. Pt. doesn't have a problem any longer.   . Stroke ([place]) 03/11/2003   stent placed on the 31, 3, 2004, L side   . Wears glasses   .  Wears hearing aid in both ears   . Wears partial dentures    Assessment: 71 y.o. male with a hx of CVA, HTN, HL, AS, MR, [name], tobacco use, remote hx colon polyps,admitted for the evaluation of CP, diaphoresis, elevated troponin. Patient with atrial fibrillation, he has ruled out for GI bleed,on IV heparin (without bolus).  -heparin level is at goal -hg with slow  trend up   Goal of Therapy:  Heparin level 0.3-0.5 units/ml - will aim for lower goal with low anemia Monitor platelets by anticoagulation protocol: Yes   Plan:  -No heparin changes -Daily heparin level and CBC  Hildred Laser, Pharm D 07/19/2017 6:51 PM

## 2017-07-19 NOTE — Progress Notes (Signed)
PULMONARY / CRITICAL CARE MEDICINE   Name: Brandon [name] MRN: [id number] DOB: 1946-05-31    ADMISSION DATE:  07/13/2017 CONSULTATION DATE:  07/14/17 REFERRING MD:  Dr. Acie Fredrickson   CHIEF COMPLAINT:  Respiratory distress  BRIEF: 71 y/o heavy smoked admitted with acute respiratory failure with hypoxemia in setting of afib with RVR and volume overload.  Had confusion not long after admission, hypotensive with precedex.  Has acute on chronic renal failure. Intubated for respiratory failure on 8/3.   SUBJECTIVE:   Severe agitation on vent this morning on wake up assessment, associated with escalating tachycardia; prior to that his tachycardia had been better controled  VITAL SIGNS: BP (!) 171/99   Pulse (!) 141   Temp 97.7 F (36.5 C) (Oral)   Resp 15   Ht 6\' 2"  (1.88 m) Comment: measured  Wt 110.9 kg (244 lb 7.8 oz)   SpO2 99%   BMI 31.39 kg/m   HEMODYNAMICS: CVP:  [9 mmHg-20 mmHg] 11 mmHg  VENTILATOR SETTINGS: Vent Mode: PRVC FiO2 (%):  [40 %] 40 % Set Rate:  [20 bmp] 20 bmp Vt Set:  [371 mL] 660 mL PEEP:  [5 cmH20] 5 cmH20 Plateau Pressure:  [19 cmH20-21 cmH20] 20 cmH20  INTAKE / OUTPUT: I/O last 3 completed shifts: In: 2721.6 [I.V.:2101.6; NG/GT:620] Out: 6560 [Urine:6560]   PHYSICAL EXAMINATION:  General:  Obese male agitated in bed on vent HENT: NCAT ETT in place PULM: Crackles, rhonchi bilaterally CV: Irreg irreg, no mgr GI: BS+, soft, nontender MSK: normal bulk and tone Neuro: agitated, moves all four extremities, doesn't follow commands     LABS:  BMET  Recent Labs Lab 07/17/17 0405 07/18/17 0900 07/19/17 0332  NA 144 147* 147*  K 3.6 3.9 3.5  CL 105 108 105  CO2 27 28 30   BUN 66* 86* 92*  CREATININE 1.98* 1.74* 1.74*  GLUCOSE 174* 142* 141*    Electrolytes  Recent Labs Lab 07/17/17 0405 07/18/17 0900 07/19/17 0332  CALCIUM 8.0* 8.6* 9.0  MG 2.6* 2.6* 2.5*  PHOS  --   --  5.2*    CBC  Recent Labs Lab 07/17/17 0943  07/18/17 0900 07/19/17 0332  WBC 15.2* 16.0* 19.6*  HGB 8.3* 8.6* 9.0*  HCT 26.4* 27.3* 29.2*  PLT 149* 188 248    Coag's  Recent Labs Lab 07/16/17 0346 07/17/17 0405 07/18/17 0945  INR 2.00 1.67 1.65    Sepsis Markers  Recent Labs Lab 07/15/17 0819 07/15/17 1518 07/17/17 0405  LATICACIDVEN 9.1* 3.6* 2.1*    ABG  Recent Labs Lab 07/15/17 0400 07/15/17 0902 07/16/17 0415  PHART 7.037* 7.404 7.548*  PCO2ART 49.6* 27.0* 29.6*  PO2ART 128* 85.0 59.0*    Liver Enzymes  Recent Labs Lab 07/16/17 0346 07/17/17 0405 07/18/17 0440  AST 2,620* 804* 294*  ALT 3,363* 2,435* 1,799*  ALKPHOS 64 66 66  BILITOT 0.9 0.8 1.0  ALBUMIN 2.8* 2.8* 2.8*    Cardiac Enzymes  Recent Labs Lab 07/15/17 0819 07/15/17 1518 07/15/17 2142  TROPONINI 0.74* 0.86* 0.87*    Glucose  Recent Labs Lab 07/18/17 1131 07/18/17 1618 07/18/17 1934 07/18/17 2342 07/19/17 0345 07/19/17 0756  GLUCAP 153* 176* 182* 158* 152* 124*    Imaging Dg Chest Port 1 View  Result Date: 07/19/2017 CLINICAL DATA:  Respiratory failure. EXAM: PORTABLE CHEST 1 VIEW COMPARISON:  Radiograph of July 17, 2017. FINDINGS: Stable cardiomegaly. Endotracheal and nasogastric tubes are unchanged in position. Right internal jugular catheter is unchanged in position. No pneumothorax  is noted. Stable bibasilar opacities are noted concerning for edema or atelectasis with associated pleural effusions. Bony thorax is unremarkable. IMPRESSION: Stable support apparatus. Stable bibasilar opacities as described above. Electronically Signed   By: Marijo Conception, M.D.   On: 07/19/2017 08:43     STUDIES:  04/27/17  ECHO >> LV moderately dilated, LVEF 55-60%, moderate AS, mild to mod AR, moderate to severe MR, LA moderately dilated, PA Peak pressure 32.  LUE duplex 8/7 > superficial thrombosis in the left basilic vein  CULTURES: Blood 8/3 >>  Respiratory 8/3 >>  MRSA screen 8/2 >> negative  ANTIBIOTICS: Vanco  8/3 >> 8/5 Zosyn 8/3 >> 8/6  SIGNIFICANT EVENTS: 8/02  Admit with SOB, AFwRVR 8/03  PCCM consulted for evaluation of respiratory distress, intubated  LINES/TUBES: ET tube 8/3 >>  R IJ CVC 8/3 >>   DISCUSSION: 71 y/o M, smoker, with PMH of HTN, PVD admitted on 8/2 with SOB and AF w RVR.  Intubated on 8/3 in setting of agitation, respiratory failure and bradycardia/hypotension on precedex.  He has acute pulmonary edema from diastolic heart failure and valvular heart disease (moderate AS/AI and mod to severe MR)  ASSESSMENT / PLAN:  PULMONARY A: Acute respiratory failure with hypoxemia Tobacco abuse Possible COPD with acute exacerbation Acute pulmonary edema Bilateral pleural effusions P:   Full mechanical vent support VAP prevention Daily WUA/SBT Wean solumedrol to prednisone 20mg  daily for the next 2-3 days Continue brovana/pulmicort Continue diuresis Stop duoneb  CARDIOVASCULAR A:  Cardiogenic shock Afib with RVR Acute decompensated diastolic heart failure Hx AS/AR, MR Bradycardia on precedex P:  Tele Dilt/amio per cardiology Continue lasix Stop scheduled albuterol Heparin per pharmacy  RENAL A:   AKI > improved P:   Monitor BMET and UOP Replace electrolytes as needed Continue diuresis with furosemide  GASTROINTESTINAL A:   Shock liver > LFT's improving Stress ulcer prophylaxis Nutrition P:   Continue tube feedings Repeat LFTs intermittently  HEMATOLOGIC A:   Anemia  Leukocytosis due to solumedrol P:  Monitor for bleeding Wean down steroids as able  INFECTIOUS A:   No evidence of pneumonia Thick tracheal secretions, wife says this is typical for him P:   Monitor for fever Send resp culture again Low threshold to resume antibiotics if worsening oxygenation or fever  ENDOCRINE A:   Hyperglycemia P:   Continue SSI  NEUROLOGIC A:   Acute Metabolic Encephalopathy - > improving History of alcohol use, at risk withdrawal (though note,  only drinks 1 beer every 3-4 days) Start clonazepam Severe agitation on wake up assessment 8/8 P:   RASS goal -1 Continue fentanyl/propofol Add low dose clonazepam   FAMILY  - Updates: wife and son updated bedside on 8/8.  They tell me that he would not want to be maintained on life support.  I advised that this is not an incurable illness but the longer he stays on mechanical ventilation the harder it will be for him to recover.  We decided to not put him through CPR should he have a cardiac arrest, so limited code blue order written.  I encouraged them to set limits on the duration of mechanical ventilation and suggested that if he is not better by August 13 to withdraw care. They are considering this.  I think it is very reasonable however to continue diuresing him and attempting to get him off the ventilator in the meantime.  Palliative care consultation is reasonable.    - Inter-disciplinary family meet or Palliative  Care meeting due by: 8/10  My cc time 40 minutes  Roselie Awkward, MD [place] [facility] Pager: [phone] Cell: [phone] After 3pm or if no response, call [phone]

## 2017-07-19 NOTE — Progress Notes (Signed)
[facility] for heparin Indication: atrial fibrillation  Allergies  Allergen Reactions  . Bee Venom Anaphylaxis and Swelling    Patient Measurements: Height: 6\' 2"  (188 cm) (measured) Weight: 244 lb 7.8 oz (110.9 kg) IBW/kg (Calculated) : 82.2   Vital Signs: Temp: 98.9 F (37.2 C) (08/08 1100) Temp Source: Oral (08/08 1100) BP: 117/85 (08/08 1700) Pulse Rate: 130 (08/08 1700)  Labs:  Recent Labs  07/17/17 0405  07/17/17 0943 07/18/17 0900 07/18/17 0945 07/18/17 1838 07/19/17 0332 07/19/17 0603  HGB  --   < > 8.3* 8.6*  --   --  9.0*  --   HCT  --   --  26.4* 27.3*  --   --  29.2*  --   PLT  --   --  149* 188  --   --  248  --   LABPROT 19.9*  --   --   --  19.7*  --   --   --   INR 1.67  --   --   --  1.65  --   --   --   HEPARINUNFRC  --   --   --   --   --  0.33  --  0.34  CREATININE 1.98*  --   --  1.74*  --   --  1.74*  --   < > = values in this interval not displayed.  Estimated Creatinine Clearance: 51.6 mL/min (A) (by C-G formula based on SCr of 1.74 mg/dL (H)).   Medical History: Past Medical History:  Diagnosis Date  . Anemia 07/13/2017  . Anxiety   . Aortic insufficiency   . Aortic stenosis, moderate 07/13/2017  . Arthritis    back   . Carotid stenosis    Right carotid stent (widely patent) 40 - 59% left plaque 11/13  . Depression   . Dyslipidemia   . GERD (gastroesophageal reflux disease)   . Heart murmur   . Hemiplegia affecting unspecified side, late effect of cerebrovascular disease    resolved- from L side   . Hypertension   . Jaundice    resolved following ERCP & Cholecystectomy  . Mitral valve insufficiency and aortic valve insufficiency   . Pre-diabetes    per spouse  . Sleep apnea    does not wear CPAP  . Sleep concern    resulted in surgery- after + sleep test. Pt. doesn't have a problem any longer.   . Stroke ([place]) 03/11/2003   stent placed on the 31, 3, 2004, L side   . Wears glasses   .  Wears hearing aid in both ears   . Wears partial dentures    Assessment: 71 y.o. male with a hx of CVA, HTN, HL, AS, MR, [name], tobacco use, remote hx colon polyps,admitted for the evaluation of CP, diaphoresis, elevated troponin.  Morning heparin level is therapeutic.  Goal of Therapy:  Heparin level 0.3-0.5 units/ml - will aim for lower goal with low anemia Monitor platelets by anticoagulation protocol: Yes   Plan:  -Continue heparin at 1200 units/hr -Daily HL, CBC  Erin Hearing PharmD., BCPS Clinical Pharmacist Pager [phone] 07/19/2017 6:50 PM

## 2017-07-19 NOTE — Progress Notes (Signed)
Patient ID: Brandon [name], male   DOB: 20-Nov-1946, 71 y.o.   MRN: [id number]     Advanced Heart Failure Rounding Note   Subjective:    8/3 developed progressive respiratory distress => Bipap => intubation.  With sedation, became hypotensive and bradycardic, dopamine + phenylephrine started.  Developed AKI. Hyperkalemia treated with HCO3 gtt and Kayexalate.  Also with metabolic acidosis with elevated lactate (HCO3 gtt begun).  LFTs rose to the 1000s range.  CXR bilateral pleural effusion.    Vancomycin/Zosyn started empirically (?PNA component) => now off abx.  Solumedrol started empirically, ?COPD exacerbation component.    Off norepi. Yesterday failed vent wean. WBC trending up. Wife at bedside and says he would not want this much done. He would not want trach.   In rapid SVT 130s today, ?flutter. On amiodarone gtt.   EE (5/18): EF 55-60%, moderate AS, moderate AI, moderate MR.   TTE (8/18): EF 55-60%, moderate LVH, moderate AS mean 33, moderate AI, moderate-severe MR  Objective:   Weight Range: 244 lb 7.8 oz (110.9 kg) Body mass index is 31.39 kg/m.   Vital Signs:   Temp:  [97.7 F (36.5 C)-98.2 F (36.8 C)] 97.7 F (36.5 C) (08/08 0715) Pulse Rate:  [44-175] 133 (08/08 0715) Resp:  [0-23] 20 (08/08 0715) BP: (80-143)/(50-105) 91/73 (08/08 0715) SpO2:  [70 %-100 %] 93 % (08/08 0815) FiO2 (%):  [40 %] 40 % (08/08 0815) Weight:  [244 lb 7.8 oz (110.9 kg)] 244 lb 7.8 oz (110.9 kg) (08/08 0500) Last BM Date: 07/18/17  Weight change: Filed Weights   07/17/17 0515 07/18/17 0422 07/19/17 0500  Weight: 255 lb 15.3 oz (116.1 kg) 252 lb 3.3 oz (114.4 kg) 244 lb 7.8 oz (110.9 kg)    Intake/Output:   Intake/Output Summary (Last 24 hours) at 07/19/17 0817 Last data filed at 07/19/17 0600  Gross per 24 hour  Intake          2030.81 ml  Output             4410 ml  Net         -2379.19 ml      Physical Exam  CVP 12 General:  Intubated/sedated.  HEENT: ETT Neck:  supple. JVP 11-12. Carotids 2+ bilat; no bruits. No lymphadenopathy or thryomegaly appreciated. Cor: PMI nondisplaced. Tachy, regular rate & rhythm. No rubs, gallops. 2/6/ SEM RUSB Lungs: clear Abdomen: soft, nontender, nondistended. No hepatosplenomegaly. No bruits or masses. Good bowel sounds. Extremities: no cyanosis, clubbing, rash, edema Neuro: sedated.  GU: Foley yellow urine    Telemetry    Personally reviewed, regular SVT 130s ?atrial flutter.   Labs    CBC  Recent Labs  07/18/17 0900 07/19/17 0332  WBC 16.0* 19.6*  HGB 8.6* 9.0*  HCT 27.3* 29.2*  MCV 95.5 95.1  PLT 188 248   Basic Metabolic Panel  Recent Labs  07/18/17 0900 07/19/17 0332  NA 147* 147*  K 3.9 3.5  CL 108 105  CO2 28 30  GLUCOSE 142* 141*  BUN 86* 92*  CREATININE 1.74* 1.74*  CALCIUM 8.6* 9.0  MG 2.6* 2.5*  PHOS  --  5.2*   Liver Function Tests  Recent Labs  07/17/17 0405 07/18/17 0440  AST 804* 294*  ALT 2,435* 1,799*  ALKPHOS 66 66  BILITOT 0.8 1.0  PROT 5.7* 5.5*  ALBUMIN 2.8* 2.8*   No results for input(s): LIPASE, AMYLASE in the last 72 hours. Cardiac Enzymes No results for input(s): CKTOTAL, CKMB,  CKMBINDEX, TROPONINI in the last 72 hours.  BNP: BNP (last 3 results)  Recent Labs  07/13/17 1341  BNP 408.4*    ProBNP (last 3 results) No results for input(s): PROBNP in the last 8760 hours.   D-Dimer No results for input(s): DDIMER in the last 72 hours. Hemoglobin A1C No results for input(s): HGBA1C in the last 72 hours. Fasting Lipid Panel  Recent Labs  07/18/17 0730  TRIG 150*   Thyroid Function Tests No results for input(s): TSH, T4TOTAL, T3FREE, THYROIDAB in the last 72 hours.  Invalid input(s): FREET3  Other results:   Imaging    No results found.   Medications:     Scheduled Medications: . arformoterol  15 mcg Nebulization BID  . budesonide (PULMICORT) nebulizer solution  0.5 mg Nebulization BID  . chlorhexidine gluconate (MEDLINE  KIT)  15 mL Mouth Rinse BID  . Chlorhexidine Gluconate Cloth  6 each Topical Daily  . docusate  100 mg Oral BID  . feeding supplement (PRO-STAT SUGAR FREE 64)  60 mL Per Tube 5 X Daily  . feeding supplement (VITAL HIGH PROTEIN)  1,000 mL Per Tube Q24H  . fentaNYL (SUBLIMAZE) injection  50 mcg Intravenous Once  . furosemide  80 mg Intravenous TID  . insulin aspart  0-20 Units Subcutaneous Q4H  . ipratropium-albuterol  3 mL Nebulization Q6H  . mouth rinse  15 mL Mouth Rinse QID  . methylPREDNISolone (SOLU-MEDROL) injection  40 mg Intravenous Daily  . pantoprazole (PROTONIX) IV  40 mg Intravenous Q24H  . polyethylene glycol  17 g Oral Daily  . sodium chloride flush  10-40 mL Intracatheter Q12H  . sodium chloride flush  3 mL Intravenous Q12H  . sodium chloride flush  3 mL Intravenous Q12H    Infusions: . sodium chloride    . sodium chloride    . sodium chloride 10 mL/hr at 07/19/17 0600  . amiodarone 60 mg/hr (07/19/17 0600)  . fentaNYL infusion INTRAVENOUS 75 mcg/hr (07/19/17 0600)  . ferumoxytol Stopped (07/17/17 1017)  . heparin 1,200 Units/hr (07/19/17 0600)  . norepinephrine (LEVOPHED) Adult infusion Stopped (07/19/17 0544)  . propofol (DIPRIVAN) infusion 25 mcg/kg/min (07/19/17 0600)    PRN Medications: sodium chloride, sodium chloride, acetaminophen, fentaNYL, metoprolol tartrate, midazolam, nitroGLYCERIN, ondansetron (ZOFRAN) IV, senna, sodium chloride flush, sodium chloride flush, sodium chloride flush    Patient Profile   71 yo with valvular heart disease and smoking/suspected COPD, CVA with carotid stenting was admitted with dyspnea/respiratory distress.   Assessment/Plan   1. Acute hypoxemic respiratory failure: Suspect this is due to pulmonary edema (noted on CXR).  He was empirically on antibiotics for ?PNA but these were stopped.  He is on Solumedrol for component of COPD exacerbation.  Yesterday failed vent wean. His was adamant he would not want trach.  2.  Acute on chronic diastolic CHF: Baseline has at least moderate valvular disease (moderate AS, moderate AI, moderate-severe MR).  Suspect diastolic CHF triggered by poorly tolerated paroxysmal atrial fibrillation in setting of valvular disease.  Admission thought to be triggered by diastolic CHF/pulmonary edema.   He developed AKI with lactic acidosis in the setting of hypotension with sedation.  Off norepi this morning. CVP 11-12    - Continue Lasix 80 mg IV every 8 hrs.   - Eventually needs left/right heart cath, but want to see renal recovery first.  3. Shock: Probably mixed cardiogenic/vasodilatory.  Developed in the setting of sedation with Bipap then intubation as well as bradycardia with sedation. Off  norepi this morning.   4. AKI: With hyperkalemia (treated) in setting of shock. Suspect ATN.  Creatinine down trending down 1.9>1.7    5. Elevated LFTs: Up to 1000s.  Suspect shock liver in setting of hypotension.  LFT coming down.   6. Atrial fibrillation: Paroxysmal.  Seems to tolerate poorly.  This morning, in SVT in 130s, regular.  ?Flutter. LFTs elevated but probably due to shock liver. LFTs trending down.  - Continue amio drip.  - EF is normal, given elevated rate and stable BP off norepinephrine, will gently add diltiazem gtt.      - See below, FOBT negative.  Continue heparin drip.   - If HR remains elevated despite meds, could consider TEE-guided DCCV.  7. Anemia: Recent fall in hgb to 8s range.  FOBT negative 8/6  Fe deficient, got feraheme.  No overt GI bleeding.  - PPI - Will eventually need GI workup.  - Hgb stable to higher.  8. ID: Blx CX NGTD. No definite infection but covered initially with empiric abx for possible PNA => stopped 8/6. WBC trending up 16>19.  Per CCM.  9. COPD: Suspected.  Active smoker.  On empiric steroids.  10. Valvular heart disease: Echo this admission with moderate AS, moderate AI, moderate to severe MR.  May need reassessment with TEE.  May eventually need  consideration of surgical valve repair/replacement with Maze given poorly tolerated afib.   Wife requesting palliative care for [place].   Length of Stay: [place], NP  07/19/2017, 8:17 AM  Advanced Heart Failure Team Pager [phone] (M-F; 7a - 4p)  Please contact [facility] for night-coverage after hours (4p -7a ) and weekends on [email or web address]  Patient seen with NP, agree with the above note.  We have not made a lot of progress over the last day.  He has diuresed well with IV Lasix and weight is down but still not able to wean vent.  CVP remains 12.  - Continue Lasix 80 mg IV every 8 hrs.   He is in regular SVT in 130s this morning, ?atrial flutter => persistent arrhythmia since yesterday, initially had paroxysmal afib now persistent ?aflutter.  Continue amiodarone gtt and will bolus.  BP stable off norepinephrine and EF normal so will add diltiazem gtt in attempt to rate control.  Will continue heparin gtt with FOBT negative.  - If rate does not improve, can consider TEE-guided DCCV.   WBCs rising but afebrile.  Now off abx.  Persistent bilateral infiltrates on CXR => effusion + edema, ?PNA.  Abx per CCM.   Not progressing as well as desired, would not want tracheostomy.  Palliative care for goals of care.  Would continue aggressive treatment today.   Loralie Champagne 07/19/2017 8:50 AM

## 2017-07-20 ENCOUNTER — Inpatient Hospital Stay (HOSPITAL_COMMUNITY): Payer: Medicare HMO

## 2017-07-20 DIAGNOSIS — Z515 Encounter for palliative care: Secondary | ICD-10-CM

## 2017-07-20 DIAGNOSIS — Z9911 Dependence on respirator [ventilator] status: Secondary | ICD-10-CM

## 2017-07-20 DIAGNOSIS — L8990 Pressure ulcer of unspecified site, unspecified stage: Secondary | ICD-10-CM | POA: Insufficient documentation

## 2017-07-20 DIAGNOSIS — Z7189 Other specified counseling: Secondary | ICD-10-CM

## 2017-07-20 DIAGNOSIS — G9340 Encephalopathy, unspecified: Secondary | ICD-10-CM

## 2017-07-20 LAB — GLUCOSE, CAPILLARY
GLUCOSE-CAPILLARY: 141 mg/dL — AB (ref 65–99)
GLUCOSE-CAPILLARY: 156 mg/dL — AB (ref 65–99)
GLUCOSE-CAPILLARY: 156 mg/dL — AB (ref 65–99)
GLUCOSE-CAPILLARY: 170 mg/dL — AB (ref 65–99)
Glucose-Capillary: 116 mg/dL — ABNORMAL HIGH (ref 65–99)
Glucose-Capillary: 152 mg/dL — ABNORMAL HIGH (ref 65–99)
Glucose-Capillary: 171 mg/dL — ABNORMAL HIGH (ref 65–99)
Glucose-Capillary: 172 mg/dL — ABNORMAL HIGH (ref 65–99)

## 2017-07-20 LAB — HEPATIC FUNCTION PANEL
ALT: 864 U/L — AB (ref 17–63)
AST: 103 U/L — ABNORMAL HIGH (ref 15–41)
Albumin: 3.1 g/dL — ABNORMAL LOW (ref 3.5–5.0)
Alkaline Phosphatase: 64 U/L (ref 38–126)
BILIRUBIN DIRECT: 0.3 mg/dL (ref 0.1–0.5)
BILIRUBIN INDIRECT: 0.7 mg/dL (ref 0.3–0.9)
TOTAL PROTEIN: 6 g/dL — AB (ref 6.5–8.1)
Total Bilirubin: 1 mg/dL (ref 0.3–1.2)

## 2017-07-20 LAB — CBC
HCT: 29.2 % — ABNORMAL LOW (ref 39.0–52.0)
HEMOGLOBIN: 9.1 g/dL — AB (ref 13.0–17.0)
MCH: 30 pg (ref 26.0–34.0)
MCHC: 31.2 g/dL (ref 30.0–36.0)
MCV: 96.4 fL (ref 78.0–100.0)
Platelets: 252 10*3/uL (ref 150–400)
RBC: 3.03 MIL/uL — ABNORMAL LOW (ref 4.22–5.81)
RDW: 15.7 % — AB (ref 11.5–15.5)
WBC: 18.3 10*3/uL — ABNORMAL HIGH (ref 4.0–10.5)

## 2017-07-20 LAB — BASIC METABOLIC PANEL
Anion gap: 10 (ref 5–15)
BUN: 116 mg/dL — AB (ref 6–20)
CALCIUM: 9 mg/dL (ref 8.9–10.3)
CO2: 32 mmol/L (ref 22–32)
CREATININE: 2.04 mg/dL — AB (ref 0.61–1.24)
Chloride: 106 mmol/L (ref 101–111)
GFR calc Af Amer: 36 mL/min — ABNORMAL LOW (ref >60)
GFR, EST NON AFRICAN AMERICAN: 31 mL/min — AB (ref >60)
GLUCOSE: 171 mg/dL — AB (ref 65–99)
Potassium: 4 mmol/L (ref 3.5–5.1)
Sodium: 148 mmol/L — ABNORMAL HIGH (ref 135–145)

## 2017-07-20 LAB — HEPARIN LEVEL (UNFRACTIONATED): HEPARIN UNFRACTIONATED: 0.4 [IU]/mL (ref 0.30–0.70)

## 2017-07-20 LAB — CULTURE, BLOOD (ROUTINE X 2)
Culture: NO GROWTH
Special Requests: ADEQUATE

## 2017-07-20 MED ORDER — FENTANYL BOLUS VIA INFUSION
25.0000 ug | INTRAVENOUS | Status: DC | PRN
  Administered 2017-07-21 (×2): 25 ug via INTRAVENOUS
  Filled 2017-07-20: qty 25

## 2017-07-20 MED ORDER — PANTOPRAZOLE SODIUM 40 MG PO PACK
40.0000 mg | PACK | Freq: Every day | ORAL | Status: DC
  Administered 2017-07-20 – 2017-07-24 (×5): 40 mg
  Filled 2017-07-20 (×5): qty 20

## 2017-07-20 MED ORDER — CLONAZEPAM 0.5 MG PO TBDP
1.0000 mg | ORAL_TABLET | Freq: Two times a day (BID) | ORAL | Status: DC
  Administered 2017-07-20 – 2017-07-21 (×3): 1 mg via ORAL
  Filled 2017-07-20 (×3): qty 2

## 2017-07-20 MED ORDER — DEXTROSE 5 % IV SOLN
2.0000 g | INTRAVENOUS | Status: DC
  Administered 2017-07-20 – 2017-07-23 (×4): 2 g via INTRAVENOUS
  Filled 2017-07-20 (×4): qty 2

## 2017-07-20 NOTE — Progress Notes (Signed)
Patient ID: Brandon [name], male   DOB: 08/21/1946, 71 y.o.   MRN: [id number]     Advanced Heart Failure Rounding Note   Subjective:    8/3 developed progressive respiratory distress => Bipap => intubation.  With sedation, became hypotensive and bradycardic, dopamine + phenylephrine started.  Developed AKI. Hyperkalemia treated with HCO3 gtt and Kayexalate.  Also with metabolic acidosis with elevated lactate (HCO3 gtt begun).  LFTs rose to the 1000s range.  CXR bilateral pleural effusion.    Vancomycin/Zosyn started empirically (?PNA component) => now off abx. Antibiotics restarted today per CCM.   Over night converted to NSR with PACs. Diltiazem drip stopped. Remains on amio 60 mg per hour.   Sedation off this morning. Agitated. Diuresing with IV lasix. Creatinine trending up 1.7>2.0.   ECHO (5/18): EF 55-60%, moderate AS, moderate AI, moderate MR.   TTE (8/18): EF 55-60%, moderate LVH, moderate AS mean 33, moderate AI, moderate-severe MR  Objective:   Weight Range: 242 lb 1 oz (109.8 kg) Body mass index is 31.08 kg/m.   Vital Signs:   Temp:  [97.6 F (36.4 C)-98.9 F (37.2 C)] 97.8 F (36.6 C) (08/09 0800) Pulse Rate:  [41-136] 69 (08/09 0845) Resp:  [0-21] 18 (08/09 0845) BP: (87-140)/(52-95) 137/95 (08/09 0845) SpO2:  [90 %-100 %] 99 % (08/09 0845) FiO2 (%):  [40 %] 40 % (08/09 0845) Weight:  [242 lb 1 oz (109.8 kg)] 242 lb 1 oz (109.8 kg) (08/09 0500) Last BM Date: 07/20/17  Weight change: Filed Weights   07/18/17 0422 07/19/17 0500 07/20/17 0500  Weight: 252 lb 3.3 oz (114.4 kg) 244 lb 7.8 oz (110.9 kg) 242 lb 1 oz (109.8 kg)    Intake/Output:   Intake/Output Summary (Last 24 hours) at 07/20/17 1006 Last data filed at 07/20/17 0800  Gross per 24 hour  Intake          2233.84 ml  Output             3325 ml  Net         -1091.16 ml      Physical Exam  CVP 14 General:  Intubated. Eyes closed.  HEENT: ETT  Neck: supple. Hard to assess.  Carotids 2+ bilat;  no bruits. No lymphadenopathy or thryomegaly appreciated. Cor: PMI nondisplaced. Regular rate & rhythm. No rubs, gallops or murmurs. Lungs: Coarse throughout  Abdomen: soft, nontender, nondistended. No hepatosplenomegaly. No bruits or masses. Good bowel sounds. Extremities: no cyanosis, clubbing, rash, edema. R and LLE SCDs.  Neuro: intubated. Not following commands. Eyes closed.  GU: foley   Telemetry    Personally reviewed NSR PACs 90s   Labs    CBC  Recent Labs  07/18/17 0900 07/19/17 0332  WBC 16.0* 19.6*  HGB 8.6* 9.0*  HCT 27.3* 29.2*  MCV 95.5 95.1  PLT 188 409   Basic Metabolic Panel  Recent Labs  07/18/17 0900 07/19/17 0332 07/20/17 0331  NA 147* 147* 148*  K 3.9 3.5 4.0  CL 108 105 106  CO2 28 30 32  GLUCOSE 142* 141* 171*  BUN 86* 92* 116*  CREATININE 1.74* 1.74* 2.04*  CALCIUM 8.6* 9.0 9.0  MG 2.6* 2.5*  --   PHOS  --  5.2*  --    Liver Function Tests  Recent Labs  07/18/17 0440 07/20/17 0331  AST 294* 103*  ALT 1,799* 864*  ALKPHOS 66 64  BILITOT 1.0 1.0  PROT 5.5* 6.0*  ALBUMIN 2.8* 3.1*   No  results for input(s): LIPASE, AMYLASE in the last 72 hours. Cardiac Enzymes No results for input(s): CKTOTAL, CKMB, CKMBINDEX, TROPONINI in the last 72 hours.  BNP: BNP (last 3 results)  Recent Labs  07/13/17 1341  BNP 408.4*    ProBNP (last 3 results) No results for input(s): PROBNP in the last 8760 hours.   D-Dimer No results for input(s): DDIMER in the last 72 hours. Hemoglobin A1C No results for input(s): HGBA1C in the last 72 hours. Fasting Lipid Panel  Recent Labs  07/18/17 0730  TRIG 150*   Thyroid Function Tests No results for input(s): TSH, T4TOTAL, T3FREE, THYROIDAB in the last 72 hours.  Invalid input(s): FREET3  Other results:   Imaging    Dg Chest Port 1 View  Result Date: 07/20/2017 CLINICAL DATA:  Acute respiratory failure. EXAM: PORTABLE CHEST 1 VIEW COMPARISON:  Multiple prior chest x-rays, most  recently dated yesterday. FINDINGS: Endotracheal tube in good position with the tip approximately 4.3 cm above the level of the carina. Partially visualized right central venous catheter with tip in the distal SVC. An enteric tube is seen extending beyond the field of view. Cardiomegaly, unchanged. Hazy right greater than left bibasilar opacities, likely representing a combination of pleural fluid and atelectasis, similar to prior study. No pneumothorax. IMPRESSION: Stable right greater than left bibasilar opacities, likely representing a combination of effusion and atelectasis. Electronically Signed   By: Titus Dubin M.D.   On: 07/20/2017 08:12     Medications:     Scheduled Medications: . arformoterol  15 mcg Nebulization BID  . budesonide (PULMICORT) nebulizer solution  0.5 mg Nebulization BID  . chlorhexidine gluconate (MEDLINE KIT)  15 mL Mouth Rinse BID  . Chlorhexidine Gluconate Cloth  6 each Topical Daily  . clonazePAM  1 mg Oral BID  . docusate  100 mg Oral BID  . feeding supplement (PRO-STAT SUGAR FREE 64)  60 mL Per Tube 5 X Daily  . feeding supplement (VITAL HIGH PROTEIN)  1,000 mL Per Tube Q24H  . fentaNYL (SUBLIMAZE) injection  50 mcg Intravenous Once  . furosemide  80 mg Intravenous TID  . insulin aspart  0-20 Units Subcutaneous Q4H  . mouth rinse  15 mL Mouth Rinse QID  . pantoprazole (PROTONIX) IV  40 mg Intravenous Q24H  . polyethylene glycol  17 g Oral Daily  . predniSONE  20 mg Per Tube Daily  . sodium chloride flush  10-40 mL Intracatheter Q12H  . sodium chloride flush  3 mL Intravenous Q12H  . sodium chloride flush  3 mL Intravenous Q12H    Infusions: . sodium chloride    . sodium chloride    . sodium chloride 10 mL/hr at 07/20/17 0600  . amiodarone 60 mg/hr (07/20/17 0641)  . ceFEPime (MAXIPIME) IV    . diltiazem (CARDIZEM) infusion Stopped (07/20/17 0641)  . fentaNYL infusion INTRAVENOUS 25 mcg/hr (07/20/17 0820)  . ferumoxytol Stopped (07/17/17 1017)    . heparin 1,200 Units/hr (07/20/17 0600)  . norepinephrine (LEVOPHED) Adult infusion Stopped (07/19/17 0544)  . propofol (DIPRIVAN) infusion 7 mcg/kg/min (07/20/17 0820)    PRN Medications: sodium chloride, sodium chloride, acetaminophen, fentaNYL, metoprolol tartrate, midazolam, nitroGLYCERIN, ondansetron (ZOFRAN) IV, senna, sodium chloride flush, sodium chloride flush, sodium chloride flush    Patient Profile   71 yo with valvular heart disease and smoking/suspected COPD, CVA with carotid stenting was admitted with dyspnea/respiratory distress.   Assessment/Plan   1. Acute hypoxemic respiratory failure: Suspect this is due to pulmonary edema (  noted on CXR).  He was empirically on antibiotics for ?PNA but these were stopped.  He is on Solumedrol for component of COPD exacerbation.  Yesterday failed vent wean. His wife says he would not want trach.   2. Acute on chronic diastolic CHF: Baseline has at least moderate valvular disease (moderate AS, moderate AI, moderate-severe MR).  Suspect diastolic CHF triggered by poorly tolerated paroxysmal atrial fibrillation in setting of valvular disease.  Admission thought to be triggered by diastolic CHF/pulmonary edema.   He developed AKI with lactic acidosis in the setting of hypotension with sedation.  Off norepi this morning.  - CVP 14.  - Continue lasix 80 mg IV every 8 hours.    - Eventually needs left/right heart cath, but want to see renal recovery first.  3. Shock: Probably mixed cardiogenic/vasodilatory.  Developed in the setting of sedation with Bipap then intubation as well as bradycardia with sedation. Off norepi this morning.   4. AKI: With hyperkalemia (treated) in setting of shock. Suspect ATN.  Creatinine down trending down 1.9>1.7 >2.04   5. Elevated LFTs: Up to 1000s.  Suspect shock liver in setting of hypotension.  LFT coming down.   6. Atrial fibrillation: Paroxysmal. Back in NSR. Off diltiazem. Cut back amio to 30 mg per  hour. -   Continue heparin drip.   7. Anemia: Recent fall in hgb to 8s range.  FOBT negative 8/6  Fe deficient, got feraheme.  No overt GI bleeding.  - PPI - Will eventually need GI workup.  -CBC pending.   8. ID: Blx CX NGTD. No definite infection but covered initially with empiric abx for possible PNA => stopped 8/6. WBC trending up 16>19.  Started on antibiotics this morning.   9. COPD: Suspected.  Active smoker.  On empiric steroids.  10. Valvular heart disease: Echo this admission with moderate AS, moderate AI, moderate to severe MR.  May need reassessment with TEE.  May eventually need consideration of surgical valve repair/replacement with Maze given poorly tolerated afib.   Palliative Care bedside for goals of care.   Length of Stay: [place], NP  07/20/2017, 10:06 AM  Advanced Heart Failure Team Pager [phone] (M-F; 7a - 4p)  Please contact [facility] for night-coverage after hours (4p -7a ) and weekends on [email or web address]  Agree with above.  He remain comatose. Sedation stopped earlier today and he remains agitated at times but will not arouse or respond to voice or noxious stimuli. EEG reviewed at bedside and no evidence of seizure activity. Head CT with old MCA stroke (from 2004) but nothing acute. Remains on vent on FiO2 40% PEEP 5. Unable to be warned further at this point. CXR (Viewed personally) with ? RLL infiltrate/effusionn. CCM has started back on abx.   On exam On vent Agitated at times but not responding to voice or noxious stimuli Cor RRR Lungs coarse no wheeze Ab soft NT/ND Ext warm no edema  Remains critical but I do not think he is clearly terminal. There is no reason to believe that he will not wake up once sedation clears completely. Main issue remaining is his respiratory status. He appears to have bad COPD and is not weaning well. Will continue diuresis and broad spectrum abx. I discussed with family and we have agreed to give him a few more days to  improve his pulmonary status will then proceed with extubation with the understanding that he would not be reintubated. They are clear that he  would never want a tracheostomy. If fails extubation will switch to comfort care,   CRITICAL CARE Performed by: Glori Bickers  Total critical care time: 40 minutes  Critical care time was exclusive of separately billable procedures and treating other patients.  Critical care was necessary to treat or prevent imminent or life-threatening deterioration.  Critical care was time spent personally by me (independent of midlevel providers or residents) on the following activities: development of treatment plan with patient and/or surrogate as well as nursing, discussions with consultants, evaluation of patient's response to treatment, examination of patient, obtaining history from patient or surrogate, ordering and performing treatments and interventions, ordering and review of laboratory studies, ordering and review of radiographic studies, pulse oximetry and re-evaluation of patient's condition.  Glori Bickers, MD  11:33 PM

## 2017-07-20 NOTE — Progress Notes (Signed)
Attempted to perform SBT vent wean, pt currently too sedated and was having apnea.  RN aware & turned down sedation.

## 2017-07-20 NOTE — Care Management Note (Signed)
Case Management Note  Patient Details  Name: Brandon [name] MRN: [id number] Date of Birth: 02-May-1946  Subjective/Objective:     Pt admitted with SOB - required intubation             Action/Plan:   PTA from home with wife - pt has recent rotator cuff surgery.  Pt remains intubated.  CM will continue to follow for discharge needs   Expected Discharge Date:                  Expected Discharge Plan:  Home/Self Care  In-House Referral:     Discharge planning Services  CM Consult  Post Acute Care Choice:    Choice offered to:     DME Arranged:    DME Agency:     HH Arranged:    HH Agency:     Status of Service:     If discussed at [employer] of Stay Meetings, dates discussed:    Additional Comments: 07/20/17  07/19/17 Pt remains on the vent with wife at bedside.  Pt was completely independent prior to PTA.  CM will continue to follow for discharge needs Maryclare Labrador, RN 07/20/2017, 11:33 AM

## 2017-07-20 NOTE — Progress Notes (Signed)
PULMONARY / CRITICAL CARE MEDICINE   Name: Brandon [name] MRN: [id number] DOB: Jan 30, 1946    ADMISSION DATE:  07/13/2017 CONSULTATION DATE:  07/14/17 REFERRING MD:  Dr. Acie Fredrickson   CHIEF COMPLAINT:  Respiratory distress  BRIEF: 71 y/o heavy smoked admitted with acute respiratory failure with hypoxemia in setting of afib with RVR and volume overload.  Had confusion not long after admission, hypotensive with precedex.  Has acute on chronic renal failure. Intubated for respiratory failure on 8/3.   SUBJECTIVE:   Sleepy Diuresing Cr up Rate control better  VITAL SIGNS: BP (!) 137/95 (BP Location: Right Arm)   Pulse 69   Temp 97.8 F (36.6 C) (Oral)   Resp 18   Ht 6\' 2"  (1.88 m) Comment: measured  Wt 109.8 kg (242 lb 1 oz)   SpO2 99%   BMI 31.08 kg/m   HEMODYNAMICS: CVP:  [12 mmHg-22 mmHg] 15 mmHg  VENTILATOR SETTINGS: Vent Mode: PRVC FiO2 (%):  [40 %] 40 % Set Rate:  [20 bmp] 20 bmp Vt Set:  [532 mL] 660 mL PEEP:  [5 cmH20] 5 cmH20 Plateau Pressure:  [17 cmH20-18 cmH20] 18 cmH20  INTAKE / OUTPUT: I/O last 3 completed shifts: In: 66 [I.V.:2813; NG/GT:785] Out: 5835 [Urine:5835]   PHYSICAL EXAMINATION:  General: Obese male in bed on vent HENT: NCAT ETT in place PULM: Crackles bases B, vent supported breathing CV: RRR, no mgr GI: BS+, soft, nontender MSK: normal bulk and tone Neuro: sedated on vent       LABS:  BMET  Recent Labs Lab 07/18/17 0900 07/19/17 0332 07/20/17 0331  NA 147* 147* 148*  K 3.9 3.5 4.0  CL 108 105 106  CO2 28 30 32  BUN 86* 92* 116*  CREATININE 1.74* 1.74* 2.04*  GLUCOSE 142* 141* 171*    Electrolytes  Recent Labs Lab 07/17/17 0405 07/18/17 0900 07/19/17 0332 07/20/17 0331  CALCIUM 8.0* 8.6* 9.0 9.0  MG 2.6* 2.6* 2.5*  --   PHOS  --   --  5.2*  --     CBC  Recent Labs Lab 07/17/17 0943 07/18/17 0900 07/19/17 0332  WBC 15.2* 16.0* 19.6*  HGB 8.3* 8.6* 9.0*  HCT 26.4* 27.3* 29.2*  PLT 149* 188 248     Coag's  Recent Labs Lab 07/16/17 0346 07/17/17 0405 07/18/17 0945  INR 2.00 1.67 1.65    Sepsis Markers  Recent Labs Lab 07/15/17 0819 07/15/17 1518 07/17/17 0405  LATICACIDVEN 9.1* 3.6* 2.1*    ABG  Recent Labs Lab 07/15/17 0400 07/15/17 0902 07/16/17 0415  PHART 7.037* 7.404 7.548*  PCO2ART 49.6* 27.0* 29.6*  PO2ART 128* 85.0 59.0*    Liver Enzymes  Recent Labs Lab 07/17/17 0405 07/18/17 0440 07/20/17 0331  AST 804* 294* 103*  ALT 2,435* 1,799* 864*  ALKPHOS 66 66 64  BILITOT 0.8 1.0 1.0  ALBUMIN 2.8* 2.8* 3.1*    Cardiac Enzymes  Recent Labs Lab 07/15/17 0819 07/15/17 1518 07/15/17 2142  TROPONINI 0.74* 0.86* 0.87*    Glucose  Recent Labs Lab 07/19/17 0756 07/19/17 1118 07/19/17 1524 07/19/17 1940 07/19/17 2351 07/20/17 0347  GLUCAP 124* 162* 148* 189* 172* 170*    Imaging Dg Chest Port 1 View  Result Date: 07/20/2017 CLINICAL DATA:  Acute respiratory failure. EXAM: PORTABLE CHEST 1 VIEW COMPARISON:  Multiple prior chest x-rays, most recently dated yesterday. FINDINGS: Endotracheal tube in good position with the tip approximately 4.3 cm above the level of the carina. Partially visualized right central venous catheter  with tip in the distal SVC. An enteric tube is seen extending beyond the field of view. Cardiomegaly, unchanged. Hazy right greater than left bibasilar opacities, likely representing a combination of pleural fluid and atelectasis, similar to prior study. No pneumothorax. IMPRESSION: Stable right greater than left bibasilar opacities, likely representing a combination of effusion and atelectasis. Electronically Signed   By: Titus Dubin M.D.   On: 07/20/2017 08:12     STUDIES:  04/27/17  ECHO >> LV moderately dilated, LVEF 55-60%, moderate AS, mild to mod AR, moderate to severe MR, LA moderately dilated, PA Peak pressure 32.  LUE duplex 8/7 > superficial thrombosis in the left basilic vein  CULTURES: Blood 8/3 >>   Respiratory 8/4 >>  Not sent MRSA screen 8/2 >> negative Resp 8/8 > GPC in pairs/clusters  ANTIBIOTICS: Vanco 8/3 >> 8/5 Zosyn 8/3 >> 8/6  SIGNIFICANT EVENTS: 8/02  Admit with SOB, AFwRVR 8/03  PCCM consulted for evaluation of respiratory distress, intubated  LINES/TUBES: ET tube 8/3 >>  R IJ CVC 8/3 >>   DISCUSSION: 72 y/o M, smoker, with PMH of HTN, PVD admitted on 8/2 with SOB and AF w RVR.  Intubated on 8/3 in setting of agitation, respiratory failure and bradycardia/hypotension on precedex.  He has acute pulmonary edema from diastolic heart failure and valvular heart disease (moderate AS/AI and mod to severe MR)  ASSESSMENT / PLAN:  PULMONARY A: Acute respiratory failure with hypoxemia Tobacco abuse Possible COPD with acute exacerbation Acute pulmonary edema Bilateral pleural effusions P:   Full mechanical vent support VAP prevention Daily WUA/SBT Continue prednisone low dose, stop in 1-2 days Continue brovana/pulmicort  CARDIOVASCULAR A:  Cardiogenic shock Afib with RVR Acute decompensated diastolic heart failure Hx AS/AR, MR Bradycardia on precedex P:  Tele Dilt/amio per cardiology Continue lasix Continue heparin  RENAL A:   AKI > slightly worse likely diuresis related P:   Monitor BMET and UOP Replace electrolytes as needed Given volume overload state would continue diuresis as we are doing  GASTROINTESTINAL A:   Shock liver > LFT's improving Stress ulcer prophylaxis Nutrition P:   Continue tube feeding Repeat LFT in AM  HEMATOLOGIC A:   Anemia  Leukocytosis due to solumedrol P:  Monitor for bleeding Wean down steroids as able  INFECTIOUS A:   GPC in sputum, possible HCAP but favor CXR findings are pulmonary edema Thick tracheal secretions, wife says this is typical for him P:   Add cefepime F/u resp cultures from 8/8  ENDOCRINE A:   Hyperglycemia P:   Continue SSI  NEUROLOGIC A:   Acute Metabolic Encephalopathy - >  improving History of alcohol use, at risk withdrawal (though note, only drinks 1 beer every 3-4 days) Start clonazepam Severe agitation on wake up assessment 8/8 P:   RASS goal -1 Continue fentanyl/propofol Increase clonazepam   FAMILY  - Updates: wife and son updated bedside on 8/8.  See detailed note from that day.  Limited code blue. Palliative consulted  - Inter-disciplinary family meet or Palliative Care meeting due by: 8/10  My cc time 40 minutes  Roselie Awkward, MD [facility] Pager: [phone] Cell: [phone] After 3pm or if no response, call [phone]

## 2017-07-20 NOTE — Consult Note (Signed)
Consultation Note Date: 07/20/2017   Patient Name: Brandon [name]  DOB: 12-29-1945  MRN: [id number]  Age / Sex: 71 y.o., male  PCP: Brandon Small, MD Referring Physician: Thayer Headings, MD  Reason for Consultation: Establishing goals of care  HPI/Patient Profile: 71 y.o. male  with past medical history of CVA, HTN, HL, AS, MR, [name], current tobacco use (since 71 yo), remote hx colon polyps admitted on 07/13/2017 with chest pain, diaphoresis, elevated troponin. Hospital stay complicated by afib RVR and volume overload leading to BiPAP and ultimately ventilator 8/3. Has continued to have issues with afib/tachycardia and agitation and has not made much progress on ventilator. Family questioning if these aggressive measures are what he would want and expectations with continuing aggressive support.   Clinical Assessment and Goals of Care: I met today at Brandon [name] bedside with wife Brandon [name], son Brandon [name], daughter-in-law, niece, and grandsons. We discussed palliative care and our role here at the hospital. They have had good and extensive conversations with Brandon [name] which they very much appreciate. They would like to reassess Brandon [name] progress tomorrow. They are hopeful for successful intubation but are also acknowledging that he would not want "all of this" and that he would be unhappy about being on life support. Brandon [name] is very clear that she does not want to continue this unless we believe he will improve with these measures (they completed Advance Directives a year ago - she is thankful for this guidance in knowing his wishes). Brandon [name] would not want defibrillation or cardioversion at this point or CPR. Brandon [name] is tearful. We briefly discussed one way extubation and focus on comfort and symptoms at this time and how we can help with this. Brandon [name] is clear she does not want him to suffer. Offered  emotional support.   Primary Decision Maker NEXT OF KIN    SUMMARY OF RECOMMENDATIONS   - Maintain intubation and take day by day (plan to reassess benefit tomorrow with Brandon [name]) - Does NOT desire CPR, defibrillation, cardioversion - Hopeful for improvement but also preparing for one way extubation (they do believe he is suffering and would not want to continue in this condition but willing to give it a couple days)  Code Status/Advance Care Planning:  Limited code - continue intubation   Symptom Management:   Per PCCM for sedation while on vent  Heart failure following  Palliative Prophylaxis:   Aspiration, Bowel Regimen, Delirium Protocol, Oral Care and Turn Reposition  Psycho-social/Spiritual:   Desire for further Chaplaincy support:no  Additional Recommendations: Caregiving  Support/Resources and Compassionate Wean Education  Prognosis:   Unable to determine  Discharge Planning: To Be Determined      Primary Diagnoses: Present on Admission: . Arrhythmia . Essential hypertension . Non-rheumatic mitral regurgitation . Anemia . Dyspnea . Respiratory distress   I have reviewed the medical record, interviewed the patient and family, and examined the patient. The following aspects are pertinent.  Past Medical History:  Diagnosis Date  . Anemia 07/13/2017  .  Anxiety   . Aortic insufficiency   . Aortic stenosis, moderate 07/13/2017  . Arthritis    back   . Carotid stenosis    Right carotid stent (widely patent) 40 - 59% left plaque 11/13  . Depression   . Dyslipidemia   . GERD (gastroesophageal reflux disease)   . Heart murmur   . Hemiplegia affecting unspecified side, late effect of cerebrovascular disease    resolved- from L side   . Hypertension   . Jaundice    resolved following ERCP & Cholecystectomy  . Mitral valve insufficiency and aortic valve insufficiency   . Pre-diabetes    per spouse  . Sleep apnea    does not wear CPAP  . Sleep  concern    resulted in surgery- after + sleep test. Pt. doesn't have a problem any longer.   . Stroke ([place]) 03/11/2003   stent placed on the 31, 3, 2004, L side   . Wears glasses   . Wears hearing aid in both ears   . Wears partial dentures    Social History   Social History  . Marital status: Married    Spouse name: N/A  . Number of children: 3  . Years of education: N/A   Occupational History  .  Retired   Social History Main Topics  . Smoking status: Current Every Day Smoker    Packs/day: 0.50    Years: 57.00    Types: Cigarettes  . Smokeless tobacco: Never Used  . Alcohol use 1.2 - 1.8 oz/week    2 - 3 Glasses of wine per week     Comment: moderate wine  . Drug use: No  . Sexual activity: Yes   Other Topics Concern  . None   Social History Narrative   Two living children.  Lives with wife.     Family History  Problem Relation Age of Onset  . Stroke Father        No details  . Angina Mother    Scheduled Meds: . arformoterol  15 mcg Nebulization BID  . budesonide (PULMICORT) nebulizer solution  0.5 mg Nebulization BID  . chlorhexidine gluconate (MEDLINE KIT)  15 mL Mouth Rinse BID  . Chlorhexidine Gluconate Cloth  6 each Topical Daily  . clonazePAM  1 mg Oral BID  . docusate  100 mg Oral BID  . feeding supplement (PRO-STAT SUGAR FREE 64)  60 mL Per Tube 5 X Daily  . feeding supplement (VITAL HIGH PROTEIN)  1,000 mL Per Tube Q24H  . fentaNYL (SUBLIMAZE) injection  50 mcg Intravenous Once  . furosemide  80 mg Intravenous TID  . insulin aspart  0-20 Units Subcutaneous Q4H  . mouth rinse  15 mL Mouth Rinse QID  . pantoprazole (PROTONIX) IV  40 mg Intravenous Q24H  . polyethylene glycol  17 g Oral Daily  . predniSONE  20 mg Per Tube Daily  . sodium chloride flush  10-40 mL Intracatheter Q12H  . sodium chloride flush  3 mL Intravenous Q12H  . sodium chloride flush  3 mL Intravenous Q12H   Continuous Infusions: . sodium chloride    . sodium chloride    .  sodium chloride 10 mL/hr at 07/20/17 0600  . amiodarone 60 mg/hr (07/20/17 0641)  . diltiazem (CARDIZEM) infusion Stopped (07/20/17 0641)  . fentaNYL infusion INTRAVENOUS 25 mcg/hr (07/20/17 0820)  . ferumoxytol Stopped (07/17/17 1017)  . heparin 1,200 Units/hr (07/20/17 0600)  . norepinephrine (LEVOPHED) Adult infusion Stopped (07/19/17 0544)  .  propofol (DIPRIVAN) infusion 7 mcg/kg/min (07/20/17 0820)   PRN Meds:.sodium chloride, sodium chloride, acetaminophen, fentaNYL, metoprolol tartrate, midazolam, nitroGLYCERIN, ondansetron (ZOFRAN) IV, senna, sodium chloride flush, sodium chloride flush, sodium chloride flush Allergies  Allergen Reactions  . Bee Venom Anaphylaxis and Swelling   Review of Systems  Unable to perform ROS   Physical Exam  Constitutional: He appears well-developed. He has a sickly appearance. He is intubated.  HENT:  Head: Normocephalic and atraumatic.  Cardiovascular: Normal rate.  An irregularly irregular rhythm present.  Pulmonary/Chest: No tachypnea. He is intubated. No respiratory distress.  Abdominal: Soft. Normal appearance.  Neurological:  Agitated/restless but unresponsive, not following any commands  Nursing note and vitals reviewed.   Vital Signs: BP (!) 137/95 (BP Location: Right Arm)   Pulse 69   Temp 97.8 F (36.6 C) (Oral)   Resp 18   Ht '6\' 2"'  (1.88 m) Comment: measured  Wt 109.8 kg (242 lb 1 oz)   SpO2 99%   BMI 31.08 kg/m  Pain Assessment: CPOT       SpO2: SpO2: 99 % O2 Device:SpO2: 99 % O2 Flow Rate: .O2 Flow Rate (L/min): 40 L/min  IO: Intake/output summary:  Intake/Output Summary (Last 24 hours) at 07/20/17 0958 Last data filed at 07/20/17 0800  Gross per 24 hour  Intake          2355.73 ml  Output             3325 ml  Net          -969.27 ml    LBM: Last BM Date: 07/20/17 Baseline Weight: Weight: 109.8 kg (242 lb) Most recent weight: Weight: 109.8 kg (242 lb 1 oz)     Palliative Assessment/Data: 10%     Time  Total: 52mn Greater than 50%  of this time was spent counseling and coordinating care related to the above assessment and plan.  Signed by: AVinie Sill NP Palliative Medicine Team Pager # [phone](M-F 8a-5p) Team Phone # [phone](Nights/Weekends)

## 2017-07-20 NOTE — Procedures (Signed)
History: 71 year old male being evaluated for encephalopathy  Sedation: Fentanyl, propofol  Technique: This is a 21 channel routine scalp EEG performed at the bedside with bipolar and monopolar montages arranged in accordance to the international 10/20 system of electrode placement. One channel was dedicated to EKG recording.    Background: The background consists predominantly of irregular delta and theta activity. There are occasional discharges with shifting bifrontal predominance usually with triphasic morphology though sometimes with biphasic morphology as well. There is an anterior posterior lag and they are more prominent with increasing level of arousal. There is a poorly formed posterior dominant rhythm of 7 Hz at times.  Photic stimulation: Physiologic driving is not performed  EEG Abnormalities: 1) triphasic waves 2) generalized irregular slow activity 3) slow PDR  Clinical Interpretation: This EEG is consistent with a generalized non-specific cerebral dysfunction(encephalopathy). There was no seizure or seizure predisposition recorded on this study. Please note that a normal EEG does not preclude the possibility of epilepsy.   Roland Rack, MD [facility] [phone]  If 7pm- 7am, please page neurology on call as listed in [place].

## 2017-07-20 NOTE — Progress Notes (Signed)
Bedside EEG completed, results pending. 

## 2017-07-20 NOTE — Progress Notes (Signed)
[place] for heparin Indication: atrial fibrillation  Allergies  Allergen Reactions  . Bee Venom Anaphylaxis and Swelling    Patient Measurements: Height: 6\' 2"  (188 cm) (measured) Weight: 242 lb 1 oz (109.8 kg) IBW/kg (Calculated) : 82.2   Vital Signs: Temp: 97.8 F (36.6 C) (08/09 0800) Temp Source: Oral (08/09 0800) BP: 137/95 (08/09 0845) Pulse Rate: 69 (08/09 0845)  Labs:  Recent Labs  07/18/17 0900 07/18/17 0945 07/18/17 1838 07/19/17 0332 07/19/17 0603 07/20/17 0331  HGB 8.6*  --   --  9.0*  --   --   HCT 27.3*  --   --  29.2*  --   --   PLT 188  --   --  248  --   --   LABPROT  --  19.7*  --   --   --   --   INR  --  1.65  --   --   --   --   HEPARINUNFRC  --   --  0.33  --  0.34  --   CREATININE 1.74*  --   --  1.74*  --  2.04*    Estimated Creatinine Clearance: 43.8 mL/min (A) (by C-G formula based on SCr of 2.04 mg/dL (H)).   Medical History: Past Medical History:  Diagnosis Date  . Anemia 07/13/2017  . Anxiety   . Aortic insufficiency   . Aortic stenosis, moderate 07/13/2017  . Arthritis    back   . Carotid stenosis    Right carotid stent (widely patent) 40 - 59% left plaque 11/13  . Depression   . Dyslipidemia   . GERD (gastroesophageal reflux disease)   . Heart murmur   . Hemiplegia affecting unspecified side, late effect of cerebrovascular disease    resolved- from L side   . Hypertension   . Jaundice    resolved following ERCP & Cholecystectomy  . Mitral valve insufficiency and aortic valve insufficiency   . Pre-diabetes    per spouse  . Sleep apnea    does not wear CPAP  . Sleep concern    resulted in surgery- after + sleep test. Pt. doesn't have a problem any longer.   . Stroke ([place]) 03/11/2003   stent placed on the 31, 3, 2004, L side   . Wears glasses   . Wears hearing aid in both ears   . Wears partial dentures    Assessment: 71 y.o. male with a hx of CVA, HTN, HL, AS, MR, mild LV  dysfunction, tobacco use, remote hx colon polyps, admitted for the evaluation of CP, diaphoresis, elevated troponin.  Morning heparin level continues to be therapeutic. No issues noted.  GPC on trach aspirate, ccm has added cefepime. Will dose adjust given renal function. CBC pending.  Goal of Therapy:  Heparin level 0.3-0.5 units/ml - will aim for lower goal with low anemia Monitor platelets by anticoagulation protocol: Yes   Plan:  -Continue heparin at 1200 units/hr -Daily HL, CBC  Erin Hearing PharmD., BCPS Clinical Pharmacist Pager [phone] 07/20/2017 9:59 AM

## 2017-07-20 NOTE — Progress Notes (Signed)
Weaned patient off sedation this am. Propofol and Fentanyl totally turned off at 1040. Patient's family concerned that patient still not opening his eyes, stating, " I just don't know why he wont open his eyes like he did yesterday." Patient still not following commands, and gets restless to sound and touch. No grip. Of note, pt's eyes rhythmically move back and forth intermittently when both eyelids are held open. Witnessed by [employer] and Audelia Acton, Therapist, sports. MD made aware. Spoke with pharmacy about side effects of patients medicines. Propofol remains off, pt currently on 50 mcg of Fentanyl.  Will continue to monitor closely.  Lucius Conn, RN

## 2017-07-20 NOTE — Progress Notes (Signed)
[facility] PCCM  The patient had nystagmus on RN exam, I was not able to witness this.  Given persistent encephalopathy will get EEG and head CT.  Roselie Awkward, MD [place] [facility] Pager: [phone] Cell: [phone] After 3pm or if no response, call [phone]

## 2017-07-21 ENCOUNTER — Inpatient Hospital Stay (HOSPITAL_COMMUNITY): Payer: Medicare HMO

## 2017-07-21 DIAGNOSIS — R410 Disorientation, unspecified: Secondary | ICD-10-CM

## 2017-07-21 LAB — CBC WITH DIFFERENTIAL/PLATELET
BASOS ABS: 0 10*3/uL (ref 0.0–0.1)
Basophils Relative: 0 %
EOS ABS: 0 10*3/uL (ref 0.0–0.7)
Eosinophils Relative: 0 %
HEMATOCRIT: 28.6 % — AB (ref 39.0–52.0)
HEMOGLOBIN: 9 g/dL — AB (ref 13.0–17.0)
LYMPHS PCT: 13 %
Lymphs Abs: 2.6 10*3/uL (ref 0.7–4.0)
MCH: 30.2 pg (ref 26.0–34.0)
MCHC: 31.5 g/dL (ref 30.0–36.0)
MCV: 96 fL (ref 78.0–100.0)
MONOS PCT: 6 %
Monocytes Absolute: 1.2 10*3/uL — ABNORMAL HIGH (ref 0.1–1.0)
NEUTROS PCT: 81 %
Neutro Abs: 16.4 10*3/uL — ABNORMAL HIGH (ref 1.7–7.7)
Platelets: 261 10*3/uL (ref 150–400)
RBC: 2.98 MIL/uL — ABNORMAL LOW (ref 4.22–5.81)
RDW: 15.6 % — ABNORMAL HIGH (ref 11.5–15.5)
WBC: 20.2 10*3/uL — ABNORMAL HIGH (ref 4.0–10.5)

## 2017-07-21 LAB — BASIC METABOLIC PANEL
ANION GAP: 12 (ref 5–15)
ANION GAP: 10 (ref 5–15)
BUN: 131 mg/dL — AB (ref 6–20)
BUN: 135 mg/dL — AB (ref 6–20)
CALCIUM: 8.8 mg/dL — AB (ref 8.9–10.3)
CALCIUM: 8.6 mg/dL — AB (ref 8.9–10.3)
CO2: 29 mmol/L (ref 22–32)
CO2: 28 mmol/L (ref 22–32)
CREATININE: 2.22 mg/dL — AB (ref 0.61–1.24)
Chloride: 106 mmol/L (ref 101–111)
Chloride: 112 mmol/L — ABNORMAL HIGH (ref 101–111)
Creatinine, Ser: 2.22 mg/dL — ABNORMAL HIGH (ref 0.61–1.24)
GFR calc Af Amer: 33 mL/min — ABNORMAL LOW (ref >60)
GFR calc Af Amer: 33 mL/min — ABNORMAL LOW (ref >60)
GFR calc non Af Amer: 28 mL/min — ABNORMAL LOW (ref >60)
GFR, EST NON AFRICAN AMERICAN: 28 mL/min — AB (ref >60)
GLUCOSE: 152 mg/dL — AB (ref 65–99)
GLUCOSE: 134 mg/dL — AB (ref 65–99)
Potassium: 3.3 mmol/L — ABNORMAL LOW (ref 3.5–5.1)
Potassium: 3.6 mmol/L (ref 3.5–5.1)
Sodium: 147 mmol/L — ABNORMAL HIGH (ref 135–145)
Sodium: 150 mmol/L — ABNORMAL HIGH (ref 135–145)

## 2017-07-21 LAB — GLUCOSE, CAPILLARY
GLUCOSE-CAPILLARY: 113 mg/dL — AB (ref 65–99)
GLUCOSE-CAPILLARY: 126 mg/dL — AB (ref 65–99)
GLUCOSE-CAPILLARY: 142 mg/dL — AB (ref 65–99)
Glucose-Capillary: 120 mg/dL — ABNORMAL HIGH (ref 65–99)
Glucose-Capillary: 127 mg/dL — ABNORMAL HIGH (ref 65–99)

## 2017-07-21 LAB — HEPATIC FUNCTION PANEL
ALBUMIN: 2.9 g/dL — AB (ref 3.5–5.0)
ALK PHOS: 57 U/L (ref 38–126)
ALT: 621 U/L — ABNORMAL HIGH (ref 17–63)
AST: 86 U/L — AB (ref 15–41)
BILIRUBIN DIRECT: 0.3 mg/dL (ref 0.1–0.5)
BILIRUBIN TOTAL: 1 mg/dL (ref 0.3–1.2)
Indirect Bilirubin: 0.7 mg/dL (ref 0.3–0.9)
Total Protein: 5.7 g/dL — ABNORMAL LOW (ref 6.5–8.1)

## 2017-07-21 LAB — COOXEMETRY PANEL
CARBOXYHEMOGLOBIN: 1.5 % (ref 0.5–1.5)
METHEMOGLOBIN: 1.3 % (ref 0.0–1.5)
O2 SAT: 78.5 %
TOTAL HEMOGLOBIN: 8.9 g/dL — AB (ref 12.0–16.0)

## 2017-07-21 LAB — HEPARIN LEVEL (UNFRACTIONATED): Heparin Unfractionated: 0.43 IU/mL (ref 0.30–0.70)

## 2017-07-21 LAB — TRIGLYCERIDES: Triglycerides: 121 mg/dL (ref <150)

## 2017-07-21 MED ORDER — POTASSIUM CHLORIDE 20 MEQ/15ML (10%) PO SOLN
40.0000 meq | ORAL | Status: AC
  Administered 2017-07-21 (×2): 40 meq
  Filled 2017-07-21 (×2): qty 30

## 2017-07-21 MED ORDER — VANCOMYCIN HCL 10 G IV SOLR
2000.0000 mg | Freq: Once | INTRAVENOUS | Status: AC
  Administered 2017-07-21: 2000 mg via INTRAVENOUS
  Filled 2017-07-21: qty 2000

## 2017-07-21 MED ORDER — SODIUM CHLORIDE 0.9 % IV BOLUS (SEPSIS)
500.0000 mL | Freq: Once | INTRAVENOUS | Status: AC
  Administered 2017-07-21: 500 mL via INTRAVENOUS

## 2017-07-21 MED ORDER — FREE WATER: 100.0000 mL | Freq: Three times a day (TID) | Status: DC

## 2017-07-21 MED ORDER — CLONAZEPAM 0.5 MG PO TBDP
1.0000 mg | ORAL_TABLET | Freq: Two times a day (BID) | ORAL | Status: DC
  Administered 2017-07-21 – 2017-07-22 (×2): 1 mg
  Filled 2017-07-21 (×2): qty 2

## 2017-07-21 MED ORDER — DOCUSATE SODIUM 50 MG/5ML PO LIQD
100.0000 mg | Freq: Two times a day (BID) | ORAL | Status: DC
  Administered 2017-07-22 – 2017-07-23 (×3): 100 mg
  Filled 2017-07-21 (×4): qty 10

## 2017-07-21 MED ORDER — FREE WATER: 200.0000 mL | Freq: Three times a day (TID) | Status: DC

## 2017-07-21 MED ORDER — FREE WATER
200.0000 mL | Freq: Four times a day (QID) | Status: DC
  Administered 2017-07-21 – 2017-07-22 (×3): 200 mL

## 2017-07-21 MED ORDER — VANCOMYCIN HCL 10 G IV SOLR
1250.0000 mg | INTRAVENOUS | Status: DC
  Administered 2017-07-22 – 2017-07-23 (×2): 1250 mg via INTRAVENOUS
  Filled 2017-07-21 (×2): qty 1250

## 2017-07-21 MED ORDER — AMIODARONE LOAD VIA INFUSION
150.0000 mg | Freq: Once | INTRAVENOUS | Status: AC
  Administered 2017-07-21: 150 mg via INTRAVENOUS

## 2017-07-21 NOTE — Progress Notes (Addendum)
Patient ID: Brandon [name], male   DOB: 08/23/1946, 71 y.o.   MRN: [id number]     Advanced Heart Failure Rounding Note   Subjective:    8/3 developed progressive respiratory distress => Bipap => intubation.  With sedation, became hypotensive and bradycardic, dopamine + phenylephrine started.  Developed AKI. Hyperkalemia treated with HCO3 gtt and Kayexalate.  Also with metabolic acidosis with elevated lactate (HCO3 gtt begun).  LFTs rose to the 1000s range.  Vancomycin/Zosyn started empirically (?PNA component) => now off abx. Antibiotics restarted 8/8.   Todays CXR with improving edema.   Failed vent wean today. Not following commands. Renal function rising. Sodium trending up. LFTs trending down. CVP down to 6. Withdraws from painful stimulation feet.   ECHO (5/18): EF 55-60%, moderate AS, moderate AI, moderate MR.   TTE (8/18): EF 55-60%, moderate LVH, moderate AS mean 33, moderate AI, moderate-severe MR  Objective:   Weight Range: 237 lb 14 oz (107.9 kg) Body mass index is 30.54 kg/m.   Vital Signs:   Temp:  [98.2 F (36.8 C)-98.6 F (37 C)] 98.5 F (36.9 C) (08/10 0800) Pulse Rate:  [35-134] 69 (08/10 0900) Resp:  [17-46] 20 (08/10 0900) BP: (103-185)/(42-157) 136/54 (08/10 0900) SpO2:  [93 %-100 %] 99 % (08/10 0900) FiO2 (%):  [40 %] 40 % (08/10 0834) Weight:  [237 lb 14 oz (107.9 kg)] 237 lb 14 oz (107.9 kg) (08/10 0500) Last BM Date: 07/21/17  Weight change: Filed Weights   07/19/17 0500 07/20/17 0500 07/21/17 0500  Weight: 244 lb 7.8 oz (110.9 kg) 242 lb 1 oz (109.8 kg) 237 lb 14 oz (107.9 kg)    Intake/Output:   Intake/Output Summary (Last 24 hours) at 07/21/17 1010 Last data filed at 07/21/17 0908  Gross per 24 hour  Intake           1928.2 ml  Output             4560 ml  Net          -2631.8 ml      Physical Exam  CVP 6 General:  Intubated. Sedated.  HEENT: ETT Neck: supple. JVP 5-6.  Carotids 2+ bilat; no bruits. No lymphadenopathy or  thryomegaly appreciated. Cor: PMI nondisplaced. Regular rate & rhythm. No rubs, gallops or murmurs. Lungs: coarse throughout  Abdomen: obese, soft, nontender, nondistended. No hepatosplenomegaly. No bruits or masses. Good bowel sounds. Extremities: no cyanosis, clubbing, rash, edema Neuro: intubated. Does not open eyes. Withdraws from pain stimulation to feet   GU: foley    Telemetry   Personally reviewed NSR with PACs/PVCs  70s   Labs    CBC  Recent Labs  07/20/17 0331 07/21/17 0412  WBC 18.3* 20.2*  NEUTROABS  --  16.4*  HGB 9.1* 9.0*  HCT 29.2* 28.6*  MCV 96.4 96.0  PLT 252 818   Basic Metabolic Panel  Recent Labs  07/19/17 0332 07/20/17 0331 07/21/17 0109  NA 147* 148* 147*  K 3.5 4.0 3.3*  CL 105 106 106  CO2 30 32 29  GLUCOSE 141* 171* 152*  BUN 92* 116* 131*  CREATININE 1.74* 2.04* 2.22*  CALCIUM 9.0 9.0 8.8*  MG 2.5*  --   --   PHOS 5.2*  --   --    Liver Function Tests  Recent Labs  07/20/17 0331 07/21/17 0109  AST 103* 86*  ALT 864* 621*  ALKPHOS 64 57  BILITOT 1.0 1.0  PROT 6.0* 5.7*  ALBUMIN 3.1* 2.9*  No results for input(s): LIPASE, AMYLASE in the last 72 hours. Cardiac Enzymes No results for input(s): CKTOTAL, CKMB, CKMBINDEX, TROPONINI in the last 72 hours.  BNP: BNP (last 3 results)  Recent Labs  07/13/17 1341  BNP 408.4*    ProBNP (last 3 results) No results for input(s): PROBNP in the last 8760 hours.   D-Dimer No results for input(s): DDIMER in the last 72 hours. Hemoglobin A1C No results for input(s): HGBA1C in the last 72 hours. Fasting Lipid Panel  Recent Labs  07/21/17 0737  TRIG 121   Thyroid Function Tests No results for input(s): TSH, T4TOTAL, T3FREE, THYROIDAB in the last 72 hours.  Invalid input(s): FREET3  Other results:   Imaging    Ct Head Wo Contrast  Result Date: 07/20/2017 CLINICAL DATA:  Initial evaluation for acute altered mental status, unexplained. EXAM: CT HEAD WITHOUT CONTRAST  TECHNIQUE: Contiguous axial images were obtained from the base of the skull through the vertex without intravenous contrast. COMPARISON:  None available. FINDINGS: Brain: Generalized age-related cerebral atrophy. Extensive encephalomalacia of throughout the right frontal and parietal lobes, consistent with remote right MCA territory infarct. No acute intracranial hemorrhage. No evidence for acute large vessel territory infarct. No mass lesion, midline shift or mass effect. No hydrocephalus. No extra-axial fluid collection. Vascular: No hyperdense vessel. Scattered vascular calcifications noted within the carotid siphons. Skull: Scalp soft tissues and calvarium within normal limits. Sinuses/Orbits: Globes orbital soft tissues are normal. Chronic left maxillary sinusitis noted. Endotracheal and enteric tubes partially visualized. Small bilateral mastoid effusions. IMPRESSION: 1. No acute intracranial process. 2. Large remote right MCA territory infarct. 3. Underlying generalized age-related cerebral atrophy. 4. Chronic left maxillary sinusitis. Electronically Signed   By: Jeannine Boga M.D.   On: 07/20/2017 14:32   Dg Chest Port 1 View  Result Date: 07/21/2017 CLINICAL DATA:  Acute respiratory failure with hypoxemia EXAM: PORTABLE CHEST 1 VIEW COMPARISON:  Chest radiograph from one day prior. FINDINGS: Endotracheal tube tip is 3.6 cm above the carina. Enteric tube enters stomach with the tip not seen on this image. Right internal jugular central venous catheter terminates in the middle third of the superior vena cava. Stable cardiomediastinal silhouette with mild cardiomegaly. No pneumothorax. Stable small bilateral pleural effusions, right greater than left. Mild pulmonary edema, slightly improved. Stable bibasilar atelectasis. IMPRESSION: 1. Well-positioned support structures. 2. Mild congestive heart failure, slightly improved. 3. Small bilateral pleural effusions with mild bibasilar atelectasis, right  greater than left, stable. Electronically Signed   By: Ilona Sorrel M.D.   On: 07/21/2017 09:12     Medications:     Scheduled Medications: . arformoterol  15 mcg Nebulization BID  . budesonide (PULMICORT) nebulizer solution  0.5 mg Nebulization BID  . chlorhexidine gluconate (MEDLINE KIT)  15 mL Mouth Rinse BID  . Chlorhexidine Gluconate Cloth  6 each Topical Daily  . clonazePAM  1 mg Per Tube BID  . docusate  100 mg Per Tube BID  . feeding supplement (PRO-STAT SUGAR FREE 64)  60 mL Per Tube 5 X Daily  . feeding supplement (VITAL HIGH PROTEIN)  1,000 mL Per Tube Q24H  . fentaNYL (SUBLIMAZE) injection  50 mcg Intravenous Once  . furosemide  80 mg Intravenous TID  . insulin aspart  0-20 Units Subcutaneous Q4H  . mouth rinse  15 mL Mouth Rinse QID  . pantoprazole sodium  40 mg Per Tube QHS  . polyethylene glycol  17 g Oral Daily  . predniSONE  20 mg Per  Tube Daily  . sodium chloride flush  10-40 mL Intracatheter Q12H  . sodium chloride flush  3 mL Intravenous Q12H  . sodium chloride flush  3 mL Intravenous Q12H    Infusions: . sodium chloride    . sodium chloride    . sodium chloride 10 mL/hr at 07/21/17 0900  . amiodarone 30 mg/hr (07/21/17 0900)  . ceFEPime (MAXIPIME) IV Stopped (07/21/17 2376)  . diltiazem (CARDIZEM) infusion Stopped (07/20/17 0641)  . fentaNYL infusion INTRAVENOUS 50 mcg/hr (07/21/17 0900)  . ferumoxytol Stopped (07/17/17 1017)  . heparin 1,200 Units/hr (07/21/17 0900)  . norepinephrine (LEVOPHED) Adult infusion Stopped (07/19/17 0544)  . propofol (DIPRIVAN) infusion Stopped (07/21/17 0859)    PRN Medications: sodium chloride, sodium chloride, acetaminophen, fentaNYL, metoprolol tartrate, midazolam, nitroGLYCERIN, ondansetron (ZOFRAN) IV, senna, sodium chloride flush, sodium chloride flush, sodium chloride flush    Patient Profile   71 yo with valvular heart disease and smoking/suspected COPD, CVA with carotid stenting was admitted with  dyspnea/respiratory distress.   Assessment/Plan   1. Acute hypoxemic respiratory failure: Suspect this is due to pulmonary edema (noted on CXR).  He was empirically on antibiotics for ?PNA but these were stopped.  He is on Solumedrol for component of COPD exacerbation.  Failed vent wean.  His wife says he would not want trach.   2. Acute on chronic diastolic CHF: Baseline has at least moderate valvular disease (moderate AS, moderate AI, moderate-severe MR).  Suspect diastolic CHF triggered by poorly tolerated paroxysmal atrial fibrillation in setting of valvular disease.  Admission thought to be triggered by diastolic CHF/pulmonary edema.   He developed AKI with lactic acidosis in the setting of hypotension with sedation.   CVP 6. Worsening renal function. Stop diuretics. Give 500 cc NS now. BMET at 1300 today.  - Eventually needs left/right heart cath, but want to see renal recovery first.  3. Shock: Probably mixed cardiogenic/vasodilatory.  Developed in the setting of sedation with Bipap then intubation as well as bradycardia with sedation. Off norepi this morning.   4. AKI: With hyperkalemia (treated) in setting of shock. Suspect ATN.  Creatinine down trending down 1.9>1.7 >2.04  BUN rising. They do not want short term HD.  5. Elevated LFTs: Up to 1000s.  Suspect shock liver in setting of hypotension. LFTs trending down.   6. Atrial fibrillation: Paroxysmal. Back in NSR. Off diltiazem. Continue amio 30 mg per hour.  -   Continue heparin drip.   7. Anemia: Recent fall in hgb to 8s range.  FOBT negative 8/6  Fe deficient, got feraheme.  No overt GI bleeding.  PPI - Will eventually need GI workup.  8. ID: Blx CX NGTD. No definite infection but covered initially with empiric abx for possible PNA => stopped 8/6. WBC trending up 16>19>20. Continue current antibiotics and add vancomycin today.    9. COPD: Suspected.  Active smoker.  On empiric steroids.  10. Valvular heart disease: Echo this admission  with moderate AS, moderate AI, moderate to severe MR.  May need reassessment with TEE.  May eventually need consideration of surgical valve repair/replacement with Maze given poorly tolerated afib. 11.  Hypernatremia- sodium 147 today.  12. Limited Code   Palliative Care following.   Length of Stay: 7  Amy Clegg, NP  07/21/2017, 10:10 AM  Advanced Heart Failure Team Pager [phone] (M-F; [place])  Please contact [facility] for night-coverage after hours (4p -7a ) and weekends on [email or web address]  Agree with above.  He remains  intubated. Unable to wean. Not following commands. EEG shows diffuse slowing but no seizure activity. CT with old MCA strok;  no-acute.   Remains in NSR on amio. On heparin for AC. Co-ox 78%  On exam remains agitated.  JVP flat Cor RRR 2/6 AS Lungs coarse Ab soft NT Ext warm no edema  Did not waken when sedation stopped yesterday. Today, however, he withdraws to painful stimulation of feet (withdraws to Babinski).   CXR with possible RLL infiltrate (reviewed personally) and WBC climbing. Abx restarted yesterday. We will expand today.  Renal function much worse with very high BUN (renal indices are normal at baseline). + hypernatremia. We stopped lasix and gave some NS but serum Na worse. Free H2O deficit is about 2.5L. Will start free H2O down NGT.  Suspect AMS likely due to uremia, infection and possibly ETOH withdrawal. No reason for - or evidence to suggest - anoxic injury.  Spoke at length with family and theya re clear that he would not want HD or trach. Will continue aggressive management with hopes that he will wake up and we can try to extubate next week with plan for no re-intubation.   CRITICAL CARE Performed by: Glori Bickers  Total critical care time: 35 minutes  Critical care time was exclusive of separately billable procedures and treating other patients.  Critical care was necessary to treat or prevent imminent or life-threatening  deterioration.  Critical care was time spent personally by me (independent of midlevel providers or residents) on the following activities: development of treatment plan with patient and/or surrogate as well as nursing, discussions with consultants, evaluation of patient's response to treatment, examination of patient, obtaining history from patient or surrogate, ordering and performing treatments and interventions, ordering and review of laboratory studies, ordering and review of radiographic studies, pulse oximetry and re-evaluation of patient's condition.  Glori Bickers, MD  11:10 PM

## 2017-07-21 NOTE — Progress Notes (Signed)
PULMONARY / CRITICAL CARE MEDICINE   Name: Brandon [name] MRN: [id number] DOB: Apr 11, 1946    ADMISSION DATE:  07/13/2017 CONSULTATION DATE:  07/14/17 REFERRING MD:  Dr. Acie Fredrickson   CHIEF COMPLAINT:  Respiratory distress  BRIEF: 71 y/o heavy smoked admitted with acute respiratory failure with hypoxemia in setting of afib with RVR and volume overload.  Had confusion not long after admission, hypotensive with precedex.  Has acute on chronic renal failure. Intubated for respiratory failure on 8/3.    STUDIES:  04/27/17  ECHO >> LV moderately dilated, LVEF 55-60%, moderate AS, mild to mod AR, moderate to severe MR, LA moderately dilated, PA Peak pressure 32.  LUE duplex 8/7 > superficial thrombosis in the left basilic vein  CULTURES: Blood 8/3 >>  Respiratory 8/4 >>  Not sent MRSA screen 8/2 >> negative Resp 8/8 > GPC in pairs/clusters  ANTIBIOTICS: Vanco 8/3 >> 8/5 Zosyn 8/3 >> 8/6 LINES/TUBES: ET tube 8/3 >>  R IJ CVC 8/3 >>    SIGNIFICANT EVENTS: 8/02  Admit with SOB, [facility] 8/03  PCCM consulted for evaluation of respiratory distress, intubated. Echo with gr2 diast dysfn. Moderat MR and Moderat AR 8/9 -  Sleepy Diuresing Cr up Rate control better    SUBJECTIVE/OVERNIGHT/INTERVAL HX 8/10 - cr up. Making urine. Lasix stopped tody due to bun 130. Failed SBT - apnei (while off sedation). On fen gtt and diprivan gtt - RN think weaning this will get agitation worse Moves 4s. Non purposeful. LFts improving. Not on pressors. Per RN 3ppd smoker and daily etop.   Wife/son =  does not wish for HD but usnure of short term CRRT but definitely no cpr, no ltach, no trach, no defib. Want to do time limited trial of full medicla care over next few days. Based on his living will and best interest  VITAL SIGNS: BP (!) 141/124   Pulse 75   Temp 98.5 F (36.9 C) (Oral)   Resp (!) 23   Ht 6\' 2"  (1.88 m) Comment: measured  Wt 107.9 kg (237 lb 14 oz)   SpO2 99%   BMI 30.54 kg/m    HEMODYNAMICS: CVP:  [7 mmHg-21 mmHg] 21 mmHg  VENTILATOR SETTINGS: Vent Mode: PRVC FiO2 (%):  [40 %] 40 % Set Rate:  [20 bmp] 20 bmp Vt Set:  [427 mL] 660 mL PEEP:  [5 cmH20] 5 cmH20 Plateau Pressure:  [17 cmH20-25 cmH20] 19 cmH20  INTAKE / OUTPUT: I/O last 3 completed shifts: In: 3248.3 [I.V.:2313.3; NG/GT:885; IV Piggyback:50] Out: 6075 [CWCBJ:6283; Stool:150]   PHYSICAL EXAMINATION:  General Appearance:    Looks criticall ill  Head:    Normocephalic, without obvious abnormality, atraumatic  Eyes:    PERRL - yes, conjunctiva/corneas - clear      Ears:    Normal external ear canals, both ears  Nose:   NG tube - no  Throat:  ETT TUBE - yes , OG tube - yes with TF  Neck:   Supple,  No enlargement/tenderness/nodules     Lungs:     Clear to auscultation bilaterally, Ventilator   Synchrony - ye  Chest wall:    No deformity  Heart:    S1 and S2 normal, no murmur, CVP - no.  Pressors - no but on  (also heparin gtt and amio gtt)  Abdomen:     Soft, no masses, no organomegaly  Genitalia:    Not done  Rectal:   not done  Extremities:   Extremities- intact  Skin:   Intact in exposed areas . Sacral area - RN thinks there might be decub     Neurologic:   Sedation - fent gtt, diprivan gtt -> RASS - -3 . Moves all 4s - yes non purposefully though not awake. CAM-ICU - not tstable . Orientation - not oriented         LABS:  PULMONARY  Recent Labs Lab 07/14/17 1343 07/14/17 1938 07/15/17 0400 07/15/17 0902 07/16/17 0415 07/21/17 1030  PHART 7.358 7.144* 7.037* 7.404 7.548*  --   PCO2ART 23.6* 38.5 49.6* 27.0* 29.6*  --   PO2ART 156.0* 343.0* 128* 85.0 59.0*  --   HCO3 13.3* 13.2* 12.7* 16.9* 25.8  --   TCO2 14 14  --  18  --   --   O2SAT 99.0 100.0 95.4 97.0 89.4 78.5    CBC  Recent Labs Lab 07/19/17 0332 07/20/17 0331 07/21/17 0412  HGB 9.0* 9.1* 9.0*  HCT 29.2* 29.2* 28.6*  WBC 19.6* 18.3* 20.2*  PLT 248 252 261    COAGULATION  Recent Labs Lab  07/16/17 0346 07/17/17 0405 07/18/17 0945  INR 2.00 1.67 1.65    CARDIAC   Recent Labs Lab 07/14/17 2227 07/15/17 0819 07/15/17 1518 07/15/17 2142  TROPONINI 0.07* 0.74* 0.86* 0.87*   No results for input(s): PROBNP in the last 168 hours.   CHEMISTRY  Recent Labs Lab 07/15/17 1518  07/17/17 0405 07/18/17 0900 07/19/17 0332 07/20/17 0331 07/21/17 0109  NA 142  < > 144 147* 147* 148* 147*  K 4.3  < > 3.6 3.9 3.5 4.0 3.3*  CL 105  < > 105 108 105 106 106  CO2 22  < > 27 28 30  32 29  GLUCOSE 225*  < > 174* 142* 141* 171* 152*  BUN 61*  < > 66* 86* 92* 116* 131*  CREATININE 2.65*  < > 1.98* 1.74* 1.74* 2.04* 2.22*  CALCIUM 7.6*  < > 8.0* 8.6* 9.0 9.0 8.8*  MG 2.4  --  2.6* 2.6* 2.5*  --   --   PHOS  --   --   --   --  5.2*  --   --   < > = values in this interval not displayed. Estimated Creatinine Clearance: 39.9 mL/min (A) (by C-G formula based on SCr of 2.22 mg/dL (H)).   LIVER  Recent Labs Lab 07/16/17 0346 07/17/17 0405 07/18/17 0440 07/18/17 0945 07/20/17 0331 07/21/17 0109  AST 2,620* 804* 294*  --  103* 86*  ALT 3,363* 2,435* 1,799*  --  864* 621*  ALKPHOS 64 66 66  --  64 57  BILITOT 0.9 0.8 1.0  --  1.0 1.0  PROT 5.6* 5.7* 5.5*  --  6.0* 5.7*  ALBUMIN 2.8* 2.8* 2.8*  --  3.1* 2.9*  INR 2.00 1.67  --  1.65  --   --      INFECTIOUS  Recent Labs Lab 07/15/17 0819 07/15/17 1518 07/17/17 0405  LATICACIDVEN 9.1* 3.6* 2.1*     ENDOCRINE CBG (last 3)   Recent Labs  07/20/17 2351 07/21/17 0351 07/21/17 0816  GLUCAP 156* 120* 113*         IMAGING x48h  - image(s) personally visualized  -   highlighted in bold Ct Head Wo Contrast  Result Date: 07/20/2017 CLINICAL DATA:  Initial evaluation for acute altered mental status, unexplained. EXAM: CT HEAD WITHOUT CONTRAST TECHNIQUE: Contiguous axial images were obtained from the base of the skull through the vertex  without intravenous contrast. COMPARISON:  None available. FINDINGS:  Brain: Generalized age-related cerebral atrophy. Extensive encephalomalacia of throughout the right frontal and parietal lobes, consistent with remote right MCA territory infarct. No acute intracranial hemorrhage. No evidence for acute large vessel territory infarct. No mass lesion, midline shift or mass effect. No hydrocephalus. No extra-axial fluid collection. Vascular: No hyperdense vessel. Scattered vascular calcifications noted within the carotid siphons. Skull: Scalp soft tissues and calvarium within normal limits. Sinuses/Orbits: Globes orbital soft tissues are normal. Chronic left maxillary sinusitis noted. Endotracheal and enteric tubes partially visualized. Small bilateral mastoid effusions. IMPRESSION: 1. No acute intracranial process. 2. Large remote right MCA territory infarct. 3. Underlying generalized age-related cerebral atrophy. 4. Chronic left maxillary sinusitis. Electronically Signed   By: Jeannine Boga M.D.   On: 07/20/2017 14:32   Dg Chest Port 1 View  Result Date: 07/21/2017 CLINICAL DATA:  Acute respiratory failure with hypoxemia EXAM: PORTABLE CHEST 1 VIEW COMPARISON:  Chest radiograph from one day prior. FINDINGS: Endotracheal tube tip is 3.6 cm above the carina. Enteric tube enters stomach with the tip not seen on this image. Right internal jugular central venous catheter terminates in the middle third of the superior vena cava. Stable cardiomediastinal silhouette with mild cardiomegaly. No pneumothorax. Stable small bilateral pleural effusions, right greater than left. Mild pulmonary edema, slightly improved. Stable bibasilar atelectasis. IMPRESSION: 1. Well-positioned support structures. 2. Mild congestive heart failure, slightly improved. 3. Small bilateral pleural effusions with mild bibasilar atelectasis, right greater than left, stable. Electronically Signed   By: Ilona Sorrel M.D.   On: 07/21/2017 09:12   Dg Chest Port 1 View  Result Date: 07/20/2017 CLINICAL DATA:   Acute respiratory failure. EXAM: PORTABLE CHEST 1 VIEW COMPARISON:  Multiple prior chest x-rays, most recently dated yesterday. FINDINGS: Endotracheal tube in good position with the tip approximately 4.3 cm above the level of the carina. Partially visualized right central venous catheter with tip in the distal SVC. An enteric tube is seen extending beyond the field of view. Cardiomegaly, unchanged. Hazy right greater than left bibasilar opacities, likely representing a combination of pleural fluid and atelectasis, similar to prior study. No pneumothorax. IMPRESSION: Stable right greater than left bibasilar opacities, likely representing a combination of effusion and atelectasis. Electronically Signed   By: Titus Dubin M.D.   On: 07/20/2017 08:12    DISCUSSION: 71 y/o M, smoker, with PMH of HTN, PVD admitted on 8/2 with SOB and AF w RVR.  Intubated on 8/3 in setting of agitation, respiratory failure and bradycardia/hypotension on precedex.  He has acute pulmonary edema from diastolic heart failure and valvular heart disease (moderate AS/AI and mod to severe MR)  ASSESSMENT / PLAN:  PULMONARY A: Acute respiratory failure with hypoxemia Tobacco abuse Possible COPD with acute exacerbation Acute pulmonary edema Bilateral pleural effusions  07/21/2017 - > does not  meet criteria for/Extubation in setting of Acute Respiratory Failure due to acute encephalipathy and apenic on SBT   P:   PRVC VAP prevention Daily WUA/SBT Continue prednisone low dose, stop in 1-2 days Continue brovana/pulmicort  CARDIOVASCULAR A:  Cardiogenic shock Afib with RVR Acute decompensated diastolic heart failure Hx AS/AR, MR Bradycardia on precedex   - not on pressors P:  Tele Dilt/amio per cardiology Continue lasix Continue heparin  RENAL A:   AKI > slightly worse likely diuresis related   - 8/10 - family declined HD but unsure of short term CRRT . Worsening BDZ:HGDJM ratio P:   Monitor BMET and UOP  now that lasix held by cards Replace electrolytes as needed Given volume overload state would continue diuresis as we are doing  GASTROINTESTINAL A:   Shock liver > LFT's improving Stress ulcer prophylaxis Nutrition   - on TF  P:   Continue tube feeding Repeat LFT in AM 07/22/17   HEMATOLOGIC A:   Anemia  Leukocytosis due to solumedrol P:  Monitor for bleeding Wean down steroids as able - PRBC for hgb </= 6.9gm%    - exceptions are   -  if ACS susepcted/confirmed then transfuse for hgb </= 8.0gm%,  or    -  active bleeding with hemodynamic instability, then transfuse regardless of hemoglobin value   At at all times try to transfuse 1 unit prbc as possible with exception of active hemorrhage    INFECTIOUS Results for orders placed or performed during the hospital encounter of 07/13/17  MRSA PCR Screening     Status: None   Collection Time: 07/13/17  8:42 PM  Result Value Ref Range Status   MRSA by PCR NEGATIVE NEGATIVE Final    Comment:        The GeneXpert MRSA Assay (FDA approved for NASAL specimens only), is one component of a comprehensive MRSA colonization surveillance program. It is not intended to diagnose MRSA infection nor to guide or monitor treatment for MRSA infections.   Culture, blood (Routine X 2) w Reflex to ID Panel     Status: None   Collection Time: 07/14/17 10:20 PM  Result Value Ref Range Status   Specimen Description BLOOD RIGHT IJ  Final   Special Requests   Final    BOTTLES DRAWN AEROBIC AND ANAEROBIC Blood Culture adequate volume   Culture NO GROWTH 5 DAYS  Final   Report Status 07/19/2017 FINAL  Final  Culture, blood (Routine X 2) w Reflex to ID Panel     Status: None   Collection Time: 07/14/17 11:32 PM  Result Value Ref Range Status   Specimen Description BLOOD BLOOD LEFT HAND  Final   Special Requests   Final    BOTTLES DRAWN AEROBIC AND ANAEROBIC Blood Culture adequate volume   Culture NO GROWTH 5 DAYS  Final   Report Status  07/20/2017 FINAL  Final  Culture, respiratory (NON-Expectorated)     Status: None (Preliminary result)   Collection Time: 07/19/17 10:13 PM  Result Value Ref Range Status   Specimen Description TRACHEAL ASPIRATE  Final   Special Requests NONE  Final   Gram Stain   Final    RARE WBC PRESENT, PREDOMINANTLY PMN FEW GRAM POSITIVE COCCI IN PAIRS IN CLUSTERS    Culture TOO YOUNG TO READ  Final   Report Status PENDING  Incomplete    A:   GPC in sputum, possible HCAP but favor CXR findings are pulmonary edema Thick tracheal secretions, wife says this is typical for him P:   Add cefepime F/u resp cultures from 8/8 Anti-infectives    Start     Dose/Rate Route Frequency Ordered Stop   07/22/17 1000  vancomycin (VANCOCIN) 1,250 mg in sodium chloride 0.9 % 250 mL IVPB     1,250 mg 166.7 mL/hr over 90 Minutes Intravenous Every 24 hours 07/21/17 1017     07/21/17 1100  vancomycin (VANCOCIN) 2,000 mg in sodium chloride 0.9 % 500 mL IVPB     2,000 mg 250 mL/hr over 120 Minutes Intravenous  Once 07/21/17 1011     07/20/17 1000  ceFEPIme (MAXIPIME) 2 g in dextrose 5 %  50 mL IVPB     2 g 100 mL/hr over 30 Minutes Intravenous Every 24 hours 07/20/17 0958     07/17/17 1000  vancomycin (VANCOCIN) 1,500 mg in sodium chloride 0.9 % 500 mL IVPB  Status:  Discontinued     1,500 mg 250 mL/hr over 120 Minutes Intravenous Every 48 hours 07/15/17 1151 07/16/17 0745   07/15/17 1230  vancomycin (VANCOCIN) IVPB 750 mg/150 ml premix     750 mg 150 mL/hr over 60 Minutes Intravenous  Once 07/15/17 1151 07/15/17 1416   07/15/17 0800  vancomycin (VANCOCIN) IVPB 750 mg/150 ml premix  Status:  Discontinued     750 mg 150 mL/hr over 60 Minutes Intravenous Every 12 hours 07/14/17 1859 07/15/17 1151   07/14/17 2000  piperacillin-tazobactam (ZOSYN) IVPB 3.375 g  Status:  Discontinued     3.375 g 12.5 mL/hr over 240 Minutes Intravenous Every 8 hours 07/14/17 1859 07/17/17 1126   07/14/17 2000  vancomycin (VANCOCIN)  2,000 mg in sodium chloride 0.9 % 500 mL IVPB     2,000 mg 250 mL/hr over 120 Minutes Intravenous  Once 07/14/17 1859 07/15/17 0039       ENDOCRINE A:   Hyperglycemia P:   Continue SSI  NEUROLOGIC A:   Acute Metabolic Encephalopathy - > improving History of alcohol use, at risk withdrawal (though note, only drinks 1 beer every 3-4 days) Start clonazepam Severe agitation on wake up assessment 8/8   - 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  See detailed note from that day.  Limited code blue. Palliative consulted  - Inter-disciplinary family meet or Palliative Care meeting due by: 8/10  dpne 07/21/17 with wife/son and RN at bedside =  Wife/son =  does not wish for HD but usnure of short term CRRT but definitely no cpr, no ltach, no trach, no defib. Want to do time limited trial of full medicla care over next few days. Based on his living will and best interest       The patient is critically ill with multiple organ systems failure and requires high complexity decision making for assessment and support, frequent evaluation and titration of therapies, application of advanced monitoring technologies and extensive interpretation of multiple databases.   Critical Care Time devoted to patient care services described in this note is  30  Minutes. This time reflects time of care of this signee Dr Brand Males. This critical care time does not reflect procedure time, or teaching time or supervisory time of PA/NP/Med student/Med Resident etc but could involve care discussion time    Dr. Brand Males, M.D., [facility].C.P Pulmonary and Critical Care Medicine Staff Physician [facility] Pager: [phone], If no answer or between  15:00h - 7:00h: call [phone]  07/21/2017 10:47  AM

## 2017-07-21 NOTE — Progress Notes (Signed)
[facility] Progress Note Patient Name: Brandon [name] DOB: 12/18/1945 MRN: [id number]   Date of Service  07/21/2017  HPI/Events of Note  Hypokalemia  eICU Interventions  Potassium replaced     Intervention Category Intermediate Interventions: Electrolyte abnormality - evaluation and management  Amber Guthridge 07/21/2017, 2:08 AM

## 2017-07-21 NOTE — Progress Notes (Addendum)
[facility] for heparin Indication: atrial fibrillation  Allergies  Allergen Reactions  . Bee Venom Anaphylaxis and Swelling    Patient Measurements: Height: 6\' 2"  (188 cm) (measured) Weight: 237 lb 14 oz (107.9 kg) IBW/kg (Calculated) : 82.2   Vital Signs: Temp: 98.5 F (36.9 C) (08/10 0800) Temp Source: Oral (08/10 0800) BP: 136/54 (08/10 0900) Pulse Rate: 69 (08/10 0900)  Labs:  Recent Labs  07/19/17 0332 07/19/17 0603 07/20/17 0331 07/20/17 1042 07/21/17 0109 07/21/17 0412 07/21/17 0737  HGB 9.0*  --  9.1*  --   --  9.0*  --   HCT 29.2*  --  29.2*  --   --  28.6*  --   PLT 248  --  252  --   --  261  --   HEPARINUNFRC  --  0.34  --  0.40  --   --  0.43  CREATININE 1.74*  --  2.04*  --  2.22*  --   --     Estimated Creatinine Clearance: 39.9 mL/min (A) (by C-G formula based on SCr of 2.22 mg/dL (H)).   Medical History: Past Medical History:  Diagnosis Date  . Anemia 07/13/2017  . Anxiety   . Aortic insufficiency   . Aortic stenosis, moderate 07/13/2017  . Arthritis    back   . Carotid stenosis    Right carotid stent (widely patent) 40 - 59% left plaque 11/13  . Depression   . Dyslipidemia   . GERD (gastroesophageal reflux disease)   . Heart murmur   . Hemiplegia affecting unspecified side, late effect of cerebrovascular disease    resolved- from L side   . Hypertension   . Jaundice    resolved following ERCP & Cholecystectomy  . Mitral valve insufficiency and aortic valve insufficiency   . Pre-diabetes    per spouse  . Sleep apnea    does not wear CPAP  . Sleep concern    resulted in surgery- after + sleep test. Pt. doesn't have a problem any longer.   . Stroke ([place]) 03/11/2003   stent placed on the 31, 3, 2004, L side   . Wears glasses   . Wears hearing aid in both ears   . Wears partial dentures    Assessment: 71 y.o. male with a hx of CVA, HTN, HL, AS, MR, [name], tobacco use, remote hx  colon polyps, admitted for the evaluation of CP, diaphoresis, elevated troponin.  Morning heparin level continues to be therapeutic. No issues noted.  Rule out sepsis/pneumonia - he continues to be afebrile, wbc trending up to 20, cefepime started yesterday with little improvement with broaden with vancomycin today.   Goal of Therapy:  Heparin level 0.3-0.5 units/ml - will aim for lower goal with low anemia Monitor platelets by anticoagulation protocol: Yes   Plan:  -Continue heparin at 1200 units/hr -Daily HL, CBC -Continue cefepime 2g q24 hours -Add vancomycin 2g IV now then 1250 IV q 24 hours  Erin Hearing PharmD., BCPS Clinical Pharmacist Pager [phone] 07/21/2017 9:58 AM

## 2017-07-22 ENCOUNTER — Inpatient Hospital Stay (HOSPITAL_COMMUNITY): Payer: Medicare HMO

## 2017-07-22 LAB — GLUCOSE, CAPILLARY
GLUCOSE-CAPILLARY: 134 mg/dL — AB (ref 65–99)
Glucose-Capillary: 128 mg/dL — ABNORMAL HIGH (ref 65–99)
Glucose-Capillary: 90 mg/dL (ref 65–99)
Glucose-Capillary: 139 mg/dL — ABNORMAL HIGH (ref 65–99)
Glucose-Capillary: 164 mg/dL — ABNORMAL HIGH (ref 65–99)
Glucose-Capillary: 156 mg/dL — ABNORMAL HIGH (ref 65–99)

## 2017-07-22 LAB — CBC
HCT: 27.5 % — ABNORMAL LOW (ref 39.0–52.0)
Hemoglobin: 8.4 g/dL — ABNORMAL LOW (ref 13.0–17.0)
MCH: 29.8 pg (ref 26.0–34.0)
MCHC: 30.5 g/dL (ref 30.0–36.0)
MCV: 97.5 fL (ref 78.0–100.0)
PLATELETS: 213 10*3/uL (ref 150–400)
RBC: 2.82 MIL/uL — ABNORMAL LOW (ref 4.22–5.81)
RDW: 16.8 % — AB (ref 11.5–15.5)
WBC: 19.5 10*3/uL — AB (ref 4.0–10.5)

## 2017-07-22 LAB — BASIC METABOLIC PANEL
Anion gap: 11 (ref 5–15)
BUN: 131 mg/dL — AB (ref 6–20)
CHLORIDE: 114 mmol/L — AB (ref 101–111)
CO2: 28 mmol/L (ref 22–32)
CREATININE: 1.99 mg/dL — AB (ref 0.61–1.24)
Calcium: 8.7 mg/dL — ABNORMAL LOW (ref 8.9–10.3)
GFR calc Af Amer: 37 mL/min — ABNORMAL LOW (ref >60)
GFR, EST NON AFRICAN AMERICAN: 32 mL/min — AB (ref >60)
Glucose, Bld: 141 mg/dL — ABNORMAL HIGH (ref 65–99)
Potassium: 3.4 mmol/L — ABNORMAL LOW (ref 3.5–5.1)
SODIUM: 153 mmol/L — AB (ref 135–145)

## 2017-07-22 LAB — HEPARIN LEVEL (UNFRACTIONATED): Heparin Unfractionated: 0.41 IU/mL (ref 0.30–0.70)

## 2017-07-22 MED ORDER — FREE WATER
250.0000 mL | Status: DC
  Administered 2017-07-22 – 2017-07-25 (×19): 250 mL

## 2017-07-22 MED ORDER — ORAL CARE MOUTH RINSE
15.0000 mL | OROMUCOSAL | Status: DC
  Administered 2017-07-22 – 2017-08-03 (×118): 15 mL via OROMUCOSAL

## 2017-07-22 MED ORDER — CLONAZEPAM 0.5 MG PO TBDP
0.5000 mg | ORAL_TABLET | Freq: Two times a day (BID) | ORAL | Status: DC
  Administered 2017-07-22 – 2017-07-23 (×2): 0.5 mg
  Filled 2017-07-22 (×2): qty 1

## 2017-07-22 MED ORDER — CHLORHEXIDINE GLUCONATE 0.12% ORAL RINSE (MEDLINE KIT)
15.0000 mL | Freq: Two times a day (BID) | OROMUCOSAL | Status: DC
  Administered 2017-07-22 – 2017-08-03 (×26): 15 mL via OROMUCOSAL

## 2017-07-22 MED ORDER — POTASSIUM CHLORIDE 20 MEQ/15ML (10%) PO SOLN
40.0000 meq | Freq: Once | ORAL | Status: AC
  Administered 2017-07-22: 40 meq
  Filled 2017-07-22: qty 30

## 2017-07-22 NOTE — Progress Notes (Signed)
Patient ID: Brandon [name], male   DOB: September 13, 1946, 71 y.o.   MRN: [id number]     Advanced Heart Failure Rounding Note   Subjective:    8/3 developed progressive respiratory distress => Bipap => intubation.  With sedation, became hypotensive and bradycardic, dopamine + phenylephrine started.  Developed AKI. Hyperkalemia treated with HCO3 gtt and Kayexalate.  Also with metabolic acidosis with elevated lactate (HCO3 gtt begun).  LFTs rose to the 1000s range.  Vancomycin/Zosyn started empirically (?PNA component) => now off abx. Antibiotics restarted 8/8 => vanco/cefepime.   CXR with bilateral airspace disease.   Failed vent wean again today. Not following commands. BUN/creatinine fairly stable but BUN markedly elevated. Sodium elevated, getting free water boluses. Good UOP despite AKI.  Weight down 2 lbs.  CVP 6.  No diuretics.   Afib/RVR overnight, now back in NSR with PACs.   ECHO (5/18): EF 55-60%, moderate AS, moderate AI, moderate MR.   TTE (8/18): EF 55-60%, moderate LVH, moderate AS mean 33, moderate AI, moderate-severe MR  Objective:   Weight Range: 235 lb 7.2 oz (106.8 kg) Body mass index is 30.23 kg/m.   Vital Signs:   Temp:  [98.2 F (36.8 C)-98.9 F (37.2 C)] 98.2 F (36.8 C) (08/11 0357) Pulse Rate:  [34-137] 57 (08/11 0700) Resp:  [16-25] 20 (08/11 0700) BP: (77-145)/(50-92) 92/50 (08/11 0700) SpO2:  [95 %-100 %] 96 % (08/11 0900) FiO2 (%):  [30 %-40 %] 30 % (08/11 0900) Weight:  [235 lb 7.2 oz (106.8 kg)] 235 lb 7.2 oz (106.8 kg) (08/11 0500) Last BM Date: 07/21/17  Weight change: Filed Weights   07/20/17 0500 07/21/17 0500 07/22/17 0500  Weight: 242 lb 1 oz (109.8 kg) 237 lb 14 oz (107.9 kg) 235 lb 7.2 oz (106.8 kg)    Intake/Output:   Intake/Output Summary (Last 24 hours) at 07/22/17 1039 Last data filed at 07/22/17 0700  Gross per 24 hour  Intake          2350.03 ml  Output             3590 ml  Net         -1239.97 ml      Physical Exam  CVP  6 General:  Intubated. Sedated.  HEENT: ETT Neck: supple. JVP not elevated.  No lymphadenopathy or thryomegaly appreciated. Cor: PMI nondisplaced. Irregular (PACs). No rubs, gallops or murmurs. Lungs: coarse throughout  Abdomen: obese, soft, nontender, nondistended. No hepatosplenomegaly. No bruits or masses. Good bowel sounds. Extremities: no cyanosis, clubbing, rash, edema Neuro: intubated. Does not open eyes. Withdraws from pain stimulation to feet   GU: foley   Telemetry   Personally reviewed NSR with PACs currently, was in afib last night.    Labs    CBC  Recent Labs  07/21/17 0412 07/22/17 0316  WBC 20.2* 19.5*  NEUTROABS 16.4*  --   HGB 9.0* 8.4*  HCT 28.6* 27.5*  MCV 96.0 97.5  PLT 261 754   Basic Metabolic Panel  Recent Labs  07/21/17 1302 07/22/17 0316  NA 150* 153*  K 3.6 3.4*  CL 112* 114*  CO2 28 28  GLUCOSE 134* 141*  BUN 135* 131*  CREATININE 2.22* 1.99*  CALCIUM 8.6* 8.7*   Liver Function Tests  Recent Labs  07/20/17 0331 07/21/17 0109  AST 103* 86*  ALT 864* 621*  ALKPHOS 64 57  BILITOT 1.0 1.0  PROT 6.0* 5.7*  ALBUMIN 3.1* 2.9*   No results for input(s): LIPASE, AMYLASE in  the last 72 hours. Cardiac Enzymes No results for input(s): CKTOTAL, CKMB, CKMBINDEX, TROPONINI in the last 72 hours.  BNP: BNP (last 3 results)  Recent Labs  07/13/17 1341  BNP 408.4*    ProBNP (last 3 results) No results for input(s): PROBNP in the last 8760 hours.   D-Dimer No results for input(s): DDIMER in the last 72 hours. Hemoglobin A1C No results for input(s): HGBA1C in the last 72 hours. Fasting Lipid Panel  Recent Labs  07/21/17 0737  TRIG 121   Thyroid Function Tests No results for input(s): TSH, T4TOTAL, T3FREE, THYROIDAB in the last 72 hours.  Invalid input(s): FREET3  Other results:   Imaging    Dg Chest Port 1 View  Result Date: 07/22/2017 CLINICAL DATA:  Endotracheal tube present. EXAM: PORTABLE CHEST 1 VIEW  COMPARISON:  One-view chest x-ray 07/21/2017. FINDINGS: The endotracheal tube terminates 4.5 cm above the carina, in satisfactory position. NG tube courses off the inferior border the film. A right IJ line is stable. The heart is mildly enlarged. Bilateral effusions and basilar airspace disease are similar to the prior exam. Mild bilateral edema is noted. IMPRESSION: 1. Stable positioning of endotracheal tube. 2. Support apparatus unchanged. 3. Stable bilateral pleural effusions and basilar airspace disease, likely atelectasis. 4. Mild edema unchanged. Electronically Signed   By: San Morelle M.D.   On: 07/22/2017 07:26     Medications:     Scheduled Medications: . arformoterol  15 mcg Nebulization BID  . budesonide (PULMICORT) nebulizer solution  0.5 mg Nebulization BID  . chlorhexidine gluconate (MEDLINE KIT)  15 mL Mouth Rinse BID  . Chlorhexidine Gluconate Cloth  6 each Topical Daily  . clonazePAM  1 mg Per Tube BID  . docusate  100 mg Per Tube BID  . feeding supplement (PRO-STAT SUGAR FREE 64)  60 mL Per Tube 5 X Daily  . feeding supplement (VITAL HIGH PROTEIN)  1,000 mL Per Tube Q24H  . fentaNYL (SUBLIMAZE) injection  50 mcg Intravenous Once  . free water  250 mL Per Tube Q4H  . insulin aspart  0-20 Units Subcutaneous Q4H  . mouth rinse  15 mL Mouth Rinse 10 times per day  . pantoprazole sodium  40 mg Per Tube QHS  . polyethylene glycol  17 g Oral Daily  . predniSONE  20 mg Per Tube Daily  . sodium chloride flush  10-40 mL Intracatheter Q12H  . sodium chloride flush  3 mL Intravenous Q12H  . sodium chloride flush  3 mL Intravenous Q12H    Infusions: . sodium chloride    . sodium chloride    . sodium chloride 10 mL/hr at 07/21/17 1900  . amiodarone 30 mg/hr (07/22/17 1011)  . ceFEPime (MAXIPIME) IV 2 g (07/22/17 1023)  . diltiazem (CARDIZEM) infusion Stopped (07/20/17 0641)  . fentaNYL infusion INTRAVENOUS 125 mcg/hr (07/22/17 0030)  . heparin 1,200 Units/hr (07/22/17  0035)  . norepinephrine (LEVOPHED) Adult infusion Stopped (07/19/17 0544)  . propofol (DIPRIVAN) infusion 20 mcg/kg/min (07/22/17 0556)  . vancomycin      PRN Medications: sodium chloride, sodium chloride, acetaminophen, fentaNYL, metoprolol tartrate, midazolam, nitroGLYCERIN, ondansetron (ZOFRAN) IV, senna, sodium chloride flush, sodium chloride flush, sodium chloride flush    Patient Profile   71 yo with valvular heart disease and smoking/suspected COPD, CVA with carotid stenting was admitted with dyspnea/respiratory distress.   Assessment/Plan   1. Acute hypoxemic respiratory failure: There has been a component of pulmonary edema but CVP now down to 6.  Antibiotics (vanco/cefepime) for suspected PNA.  He is on prednisone for ? component of COPD exacerbation.  - Failed vent wean again today.  His wife says he would not want trach.   2. Acute on chronic diastolic CHF: Baseline has at least moderate valvular disease (moderate AS, moderate AI, moderate-severe MR).  Suspect diastolic CHF triggered by poorly tolerated paroxysmal atrial fibrillation in setting of valvular disease.  Admission thought to be triggered by diastolic CHF/pulmonary edema.   He developed AKI with lactic acidosis in the setting of hypotension with sedation.  CVP 6. Worsening renal function. He is off diuretics. Making good urine.  - Left/right heart cath if recovers.  3. Shock: Probably mixed cardiogenic/vasodilatory.  Developed in the setting of sedation with Bipap then intubation as well as bradycardia with sedation. Off norepinephrine now.   4. AKI: With hyperkalemia (treated) in setting of shock. Suspect ATN.  Creatinine 1.9>1.7 >2.04 > 1.99  BUN rising and now very high.  Poor mental status may be due at least in part to uremia. They do not want short term HD.  5. Elevated LFTs: Up to 1000s.  Suspect shock liver in setting of hypotension. LFTs trending down.   6. Atrial fibrillation: Paroxysmal. NSR this morning with  PACs.  - Continue amio 30 mg per hour.  - Continue heparin drip.   7. Anemia: Recent fall in hgb to 8s range.  FOBT negative 8/6.  Fe deficient, got feraheme.  No overt GI bleeding.  PPI - Will eventually need GI workup if recovers.  8. ID: Blx CX NGTD. Possible PNA.  On vancomycin/cefepime.   9. COPD: Suspected.  Active smoker.  On prednisone to cover exacerbation.  10. Valvular heart disease: Echo this admission with moderate AS, moderate AI, moderate to severe MR.  May need reassessment with TEE.  May eventually need consideration of surgical valve repair/replacement with Maze given poorly tolerated afib. 11. Hypernatremia: 153 today, getting free water boluses.  12. Neuro: Altered mental status, does not respond to commands or open eyes.  ?Uremia + infection, ?component of ETOH withdrawal.  13. Limited Code.  Would continue aggressive care through weekend, consider comfort care Monday if no progress.   Palliative Care following.   Length of Stay: 8  Loralie Champagne, MD  07/22/2017, 10:39 AM  Advanced Heart Failure Team Pager [phone] (M-F; 7a - 4p)  Please contact [facility] for night-coverage after hours (4p -7a ) and weekends on [email or web address]

## 2017-07-22 NOTE — Progress Notes (Signed)
PULMONARY / CRITICAL CARE MEDICINE   Name: Brandon [name] MRN: [id number] DOB: 1946-02-04    ADMISSION DATE:  07/13/2017 CONSULTATION DATE:  07/14/17 REFERRING MD:  Dr. Acie Fredrickson   CHIEF COMPLAINT:  Respiratory distress  BRIEF: 71 y/o heavy smoker admitted with acute respiratory failure with hypoxemia in setting of afib with RVR and volume overload.  Had confusion not long after admission, hypotensive with precedex.  Has acute on chronic renal failure. Intubated for respiratory failure on 8/3.   SUBJECTIVE:    VITAL SIGNS: BP (!) 92/50   Pulse (!) 57   Temp 98.2 F (36.8 C) (Oral)   Resp 20   Ht 6\' 2"  (1.88 m) Comment: measured  Wt 235 lb 7.2 oz (106.8 kg)   SpO2 96%   BMI 30.23 kg/m   HEMODYNAMICS: CVP:  [4 mmHg-12 mmHg] 6 mmHg  VENTILATOR SETTINGS: Vent Mode: PRVC FiO2 (%):  [30 %-40 %] 30 % Set Rate:  [20 bmp] 20 bmp Vt Set:  [086 mL] 660 mL PEEP:  [5 cmH20] 5 cmH20 Plateau Pressure:  [17 cmH20-19 cmH20] 18 cmH20  INTAKE / OUTPUT: I/O last 3 completed shifts: In: 4656.8 [I.V.:2026.8; Other:30; NG/GT:1550; IV Piggyback:1050] Out: 7619 [JKDTO:6712; Stool:100]   PHYSICAL EXAMINATION: General: elderly male in NAD on vent   HEENT: MM pink/moist, no jvd Neuro: moves to stimulation, no follow commands  CV: s1s2 irr irr, 2/6 SEM, no m/r/g PULM: even/non-labored, lungs bilaterally clear WP:YKDX, non-tender, bsx4 active  Extremities: warm/dry, no edema  Skin: no rashes or lesions  LABS:  BMET  Recent Labs Lab 07/21/17 0109 07/21/17 1302 07/22/17 0316  NA 147* 150* 153*  K 3.3* 3.6 3.4*  CL 106 112* 114*  CO2 29 28 28   BUN 131* 135* 131*  CREATININE 2.22* 2.22* 1.99*  GLUCOSE 152* 134* 141*    Electrolytes  Recent Labs Lab 07/17/17 0405 07/18/17 0900 07/19/17 0332  07/21/17 0109 07/21/17 1302 07/22/17 0316  CALCIUM 8.0* 8.6* 9.0  < > 8.8* 8.6* 8.7*  MG 2.6* 2.6* 2.5*  --   --   --   --   PHOS  --   --  5.2*  --   --   --   --   < > = values in  this interval not displayed.  CBC  Recent Labs Lab 07/20/17 0331 07/21/17 0412 07/22/17 0316  WBC 18.3* 20.2* 19.5*  HGB 9.1* 9.0* 8.4*  HCT 29.2* 28.6* 27.5*  PLT 252 261 213    Coag's  Recent Labs Lab 07/16/17 0346 07/17/17 0405 07/18/17 0945  INR 2.00 1.67 1.65    Sepsis Markers  Recent Labs Lab 07/15/17 1518 07/17/17 0405  LATICACIDVEN 3.6* 2.1*    ABG  Recent Labs Lab 07/16/17 0415  PHART 7.548*  PCO2ART 29.6*  PO2ART 59.0*    Liver Enzymes  Recent Labs Lab 07/18/17 0440 07/20/17 0331 07/21/17 0109  AST 294* 103* 86*  ALT 1,799* 864* 621*  ALKPHOS 66 64 57  BILITOT 1.0 1.0 1.0  ALBUMIN 2.8* 3.1* 2.9*    Cardiac Enzymes  Recent Labs Lab 07/15/17 1518 07/15/17 2142  TROPONINI 0.86* 0.87*    Glucose  Recent Labs Lab 07/21/17 1205 07/21/17 1625 07/21/17 1959 07/22/17 0059 07/22/17 0353 07/22/17 0826  GLUCAP 127* 126* 142* 128* 90 139*    Imaging Dg Chest Port 1 View  Result Date: 07/22/2017 CLINICAL DATA:  Endotracheal tube present. EXAM: PORTABLE CHEST 1 VIEW COMPARISON:  One-view chest x-ray 07/21/2017. FINDINGS: The endotracheal tube terminates  4.5 cm above the carina, in satisfactory position. NG tube courses off the inferior border the film. A right IJ line is stable. The heart is mildly enlarged. Bilateral effusions and basilar airspace disease are similar to the prior exam. Mild bilateral edema is noted. IMPRESSION: 1. Stable positioning of endotracheal tube. 2. Support apparatus unchanged. 3. Stable bilateral pleural effusions and basilar airspace disease, likely atelectasis. 4. Mild edema unchanged. Electronically Signed   By: San Morelle M.D.   On: 07/22/2017 07:26     STUDIES:  04/27/17  ECHO >> LV moderately dilated, LVEF 55-60%, moderate AS, mild to mod AR, moderate to severe MR, LA moderately dilated, PA Peak pressure 32.  LUE duplex 8/7 > superficial thrombosis in the left basilic vein  CULTURES: Blood  8/3 >> negative Respiratory 8/4 >>  Not sent MRSA screen 8/2 >> negative Resp 8/8 >> few staph aureus >>  ANTIBIOTICS: Vanco 8/3 >> 8/5 Zosyn 8/3 >> 8/6 Cefepime 8/9 >>  Vanco 8/10 >>   SIGNIFICANT EVENTS: 8/02  Admit with SOB, AFwRVR 8/03  PCCM consulted for evaluation of respiratory distress, intubated 8/09  Diuresing, Sr Cr increased, rate control improved  8/11  Not waking, off diuretics,    LINES/TUBES: ETT 8/3 >>  R IJ CVC 8/3 >>   DISCUSSION: 71 y/o M, smoker, with PMH of HTN, PVD admitted on 8/2 with SOB and AF w RVR.  Intubated on 8/3 in setting of agitation, respiratory failure and bradycardia/hypotension on precedex.  He has acute pulmonary edema from diastolic heart failure and valvular heart disease (moderate AS/AI and mod to severe MR)  ASSESSMENT / PLAN:  PULMONARY A: Acute respiratory failure with hypoxemia Tobacco abuse Possible COPD with acute exacerbation Acute pulmonary edema Bilateral pleural effusions P:   PRVC 8 cc/kg  Wean PEEP / FiO2 for sats > 92% Daily WUA / SBT  VAP prevention measures  Prednisone 20 mg QD, stop date added for 8/12 Continue Brovana + Pulmicort  Intermittent CXR  CARDIOVASCULAR A:  Cardiogenic shock Afib with RVR Acute decompensated diastolic heart failure Hx AS/AR, MR Bradycardia on precedex P:  Tele monitoring  Antiarrythmic's per Cardiology > amiodarone / cardizem Heparin gtt per pharmacy   RENAL A:   AKI  Hypernatremia  Hypokalemia  P:   Monitor BMET and UOP Replace electrolytes as needed Given volume overload state would continue diuresis as we are doing Free water 250 ml Q4  Defer diuresis to Cardiology   GASTROINTESTINAL A:   Shock liver - LFT's improving Stress ulcer prophylaxis Nutrition P:   TF per nutrition  PPI for SUP  Trend LFT's   HEMATOLOGIC A:   Anemia  Leukocytosis due to solumedrol P:  Trend CBC  Heparin gtt per pharmacy   INFECTIOUS A:   GPC in sputum, possible HCAP  but favor CXR findings are pulmonary edema Thick tracheal secretions, wife says this is typical for him P:   Abx as above, narrow as able  Follow cultures   ENDOCRINE A:   Hyperglycemia P:   SSI   NEUROLOGIC A:   Acute Metabolic Encephalopathy - > improving History of alcohol use, at risk withdrawal (though note, only drinks 1 beer every 3-4 days) Start clonazepam Severe agitation on wake up assessment 8/8 P:   RASS goal - 0 to -1  Propofol low dose for sedation  Attempt to minimize fentanyl unless agitation to allow for neuro assessment  Reduce klonopin to 0.5 BID > consider d/c all together with AKI /  poor clearance, not on benzo's at home Consider PRN sedation only  FAMILY  - Updates: LCB.  Wife updated at bedside 8/11.     CC Time:  30 minutes   Noe Gens, NP-C [facility] Pgr: [phone] or if no answer [phone] 07/22/2017, 10:21 AM

## 2017-07-22 NOTE — Progress Notes (Signed)
[facility] Progress Note Patient Name: Brandon [name] DOB: [birth year] MRN: [id number]   Date of Service  07/22/2017  HPI/Events of Note  K 3.4  eICU Interventions  40 PO K given     Intervention Category Major Interventions: Electrolyte abnormality - evaluation and management  Leeann Bady 07/22/2017, 4:33 AM

## 2017-07-22 NOTE — Plan of Care (Deleted)
Problem: Activity: Goal: Risk for activity intolerance will decrease Outcome: Progressing Pt up in chair for  3 hours today  Problem: Education: Goal: Knowledge of the prescribed therapeutic regimen will improve Outcome: Progressing Pt's daughter Sharyn Lull present during driveline dressing change. Sharyn Lull was able to don sterile gloves and participate in dressing change with RN. Needs to do by herself next dressing change she is present for.

## 2017-07-22 NOTE — Progress Notes (Addendum)
0050 Pt went into afib RVR rate in the 130s.  EKG obtained.  Lopressor 2.5 PRN dose given with rate reduced to the 60s -- still afib.    0230 Pt went back into afib RVR rate in the 120s-130s.  [facility] notified. No new orders obtained at this time.  RN will continue to monitor patient closely.   Bo Merino, RN

## 2017-07-22 NOTE — Plan of Care (Signed)
Problem: Education: Goal: Knowledge of [facility] Education information/materials will improve Outcome: Not Progressing Pt remains intubated and sedated  Problem: Tissue Perfusion: Goal: Risk factors for ineffective tissue perfusion will decrease Outcome: Progressing Turning q2  Problem: Activity: Goal: Risk for activity intolerance will decrease Outcome: Not Progressing Pt remains intubated and sedated  Problem: Fluid Volume: Goal: Ability to maintain a balanced intake and output will improve Outcome: Progressing Tolerating tube feeds

## 2017-07-22 NOTE — Progress Notes (Signed)
[facility] for heparin Indication: atrial fibrillation  Allergies  Allergen Reactions  . Bee Venom Anaphylaxis and Swelling    Patient Measurements: Height: 6\' 2"  (188 cm) (measured) Weight: 235 lb 7.2 oz (106.8 kg) IBW/kg (Calculated) : 82.2   Vital Signs: Temp: 98.2 F (36.8 C) (08/11 0357) Temp Source: Oral (08/11 0357) BP: 92/50 (08/11 0700) Pulse Rate: 57 (08/11 0700)  Labs:  Recent Labs  07/20/17 0331 07/20/17 1042 07/21/17 0109 07/21/17 0412 07/21/17 0737 07/21/17 1302 07/22/17 0316  HGB 9.1*  --   --  9.0*  --   --  8.4*  HCT 29.2*  --   --  28.6*  --   --  27.5*  PLT 252  --   --  261  --   --  213  HEPARINUNFRC  --  0.40  --   --  0.43  --  0.41  CREATININE 2.04*  --  2.22*  --   --  2.22* 1.99*    Estimated Creatinine Clearance: 44.3 mL/min (A) (by C-G formula based on SCr of 1.99 mg/dL (H)).   Medical History: Past Medical History:  Diagnosis Date  . Anemia 07/13/2017  . Anxiety   . Aortic insufficiency   . Aortic stenosis, moderate 07/13/2017  . Arthritis    back   . Carotid stenosis    Right carotid stent (widely patent) 40 - 59% left plaque 11/13  . Depression   . Dyslipidemia   . GERD (gastroesophageal reflux disease)   . Heart murmur   . Hemiplegia affecting unspecified side, late effect of cerebrovascular disease    resolved- from L side   . Hypertension   . Jaundice    resolved following ERCP & Cholecystectomy  . Mitral valve insufficiency and aortic valve insufficiency   . Pre-diabetes    per spouse  . Sleep apnea    does not wear CPAP  . Sleep concern    resulted in surgery- after + sleep test. Pt. doesn't have a problem any longer.   . Stroke ([place]) 03/11/2003   stent placed on the 31, 3, 2004, L side   . Wears glasses   . Wears hearing aid in both ears   . Wears partial dentures    Assessment: 30 yoM with a hx of CVA, HTN, HL, AS, MR, [name], tobacco use, remote hx colon  polyps, admitted for the evaluation of CP, diaphoresis, elevated troponin. Pt is on heparin for AFib, level remains therapeutic today at 0.41, Hgb low stable and pltc wnl.  Goal of Therapy:  Heparin level 0.3-0.5 units/ml - will aim for lower goal with low anemia Monitor platelets by anticoagulation protocol: Yes   Plan:  -Continue heparin at 1200 units/hr -Daily HL, CBC, S/Sx bleeding  Arrie Senate, PharmD PGY-2 Cardiology Pharmacy Resident Pager: [phone] 07/22/2017

## 2017-07-23 ENCOUNTER — Inpatient Hospital Stay (HOSPITAL_COMMUNITY): Payer: Medicare HMO

## 2017-07-23 LAB — CULTURE, RESPIRATORY W GRAM STAIN

## 2017-07-23 LAB — BASIC METABOLIC PANEL
ANION GAP: 9 (ref 5–15)
BUN: 103 mg/dL — ABNORMAL HIGH (ref 6–20)
CHLORIDE: 117 mmol/L — AB (ref 101–111)
CO2: 25 mmol/L (ref 22–32)
Calcium: 8.5 mg/dL — ABNORMAL LOW (ref 8.9–10.3)
Creatinine, Ser: 1.74 mg/dL — ABNORMAL HIGH (ref 0.61–1.24)
GFR calc Af Amer: 44 mL/min — ABNORMAL LOW (ref >60)
GFR calc non Af Amer: 38 mL/min — ABNORMAL LOW (ref >60)
Glucose, Bld: 126 mg/dL — ABNORMAL HIGH (ref 65–99)
Potassium: 3.8 mmol/L (ref 3.5–5.1)
Sodium: 151 mmol/L — ABNORMAL HIGH (ref 135–145)

## 2017-07-23 LAB — CBC
HCT: 27.4 % — ABNORMAL LOW (ref 39.0–52.0)
HEMOGLOBIN: 8.3 g/dL — AB (ref 13.0–17.0)
MCH: 30 pg (ref 26.0–34.0)
MCHC: 30.3 g/dL (ref 30.0–36.0)
MCV: 98.9 fL (ref 78.0–100.0)
Platelets: 235 10*3/uL (ref 150–400)
RBC: 2.77 MIL/uL — ABNORMAL LOW (ref 4.22–5.81)
RDW: 18.1 % — ABNORMAL HIGH (ref 11.5–15.5)
WBC: 18.5 10*3/uL — AB (ref 4.0–10.5)

## 2017-07-23 LAB — GLUCOSE, CAPILLARY
GLUCOSE-CAPILLARY: 128 mg/dL — AB (ref 65–99)
GLUCOSE-CAPILLARY: 151 mg/dL — AB (ref 65–99)
Glucose-Capillary: 123 mg/dL — ABNORMAL HIGH (ref 65–99)
Glucose-Capillary: 139 mg/dL — ABNORMAL HIGH (ref 65–99)
Glucose-Capillary: 163 mg/dL — ABNORMAL HIGH (ref 65–99)
Glucose-Capillary: 160 mg/dL — ABNORMAL HIGH (ref 65–99)

## 2017-07-23 LAB — HEPARIN LEVEL (UNFRACTIONATED): HEPARIN UNFRACTIONATED: 0.49 [IU]/mL (ref 0.30–0.70)

## 2017-07-23 LAB — CULTURE, RESPIRATORY

## 2017-07-23 MED ORDER — SODIUM CHLORIDE 0.9 % IV SOLN
500.0000 mg | Freq: Three times a day (TID) | INTRAVENOUS | Status: AC
  Administered 2017-07-23 – 2017-07-26 (×9): 500 mg via INTRAVENOUS
  Filled 2017-07-23 (×10): qty 5

## 2017-07-23 MED ORDER — AMIODARONE IV BOLUS ONLY 150 MG/100ML
150.0000 mg | Freq: Once | INTRAVENOUS | Status: AC
  Administered 2017-07-23: 150 mg via INTRAVENOUS

## 2017-07-23 MED ORDER — SODIUM CHLORIDE 0.9 % IV SOLN
0.1000 ug/kg/h | INTRAVENOUS | Status: DC
  Administered 2017-07-23 (×3): 1 ug/kg/h via INTRAVENOUS
  Filled 2017-07-23 (×5): qty 2

## 2017-07-23 MED ORDER — SODIUM CHLORIDE 0.9 % IV SOLN
0.4000 ug/kg/h | INTRAVENOUS | Status: DC
  Administered 2017-07-23: 1 ug/kg/h via INTRAVENOUS
  Administered 2017-07-24: 0.7 ug/kg/h via INTRAVENOUS
  Administered 2017-07-24: 1 ug/kg/h via INTRAVENOUS
  Administered 2017-07-24: 0.6 ug/kg/h via INTRAVENOUS
  Administered 2017-07-24 – 2017-07-25 (×4): 0.8 ug/kg/h via INTRAVENOUS
  Administered 2017-07-25: 0.6 ug/kg/h via INTRAVENOUS
  Administered 2017-07-25: 0.8 ug/kg/h via INTRAVENOUS
  Administered 2017-07-26 – 2017-08-01 (×3): 0.4 ug/kg/h via INTRAVENOUS
  Administered 2017-08-01: 0.7 ug/kg/h via INTRAVENOUS
  Filled 2017-07-23 (×17): qty 4

## 2017-07-23 MED ORDER — MIDAZOLAM HCL 2 MG/2ML IJ SOLN
1.0000 mg | INTRAMUSCULAR | Status: DC | PRN
  Administered 2017-07-24: 4 mg via INTRAVENOUS
  Administered 2017-07-24: 2 mg via INTRAVENOUS
  Administered 2017-07-24: 1 mg via INTRAVENOUS
  Filled 2017-07-23 (×5): qty 2

## 2017-07-23 MED ORDER — CIPROFLOXACIN IN D5W 400 MG/200ML IV SOLN
400.0000 mg | Freq: Two times a day (BID) | INTRAVENOUS | Status: AC
  Administered 2017-07-23 – 2017-07-29 (×13): 400 mg via INTRAVENOUS
  Filled 2017-07-23 (×13): qty 200

## 2017-07-23 MED ORDER — FUROSEMIDE 10 MG/ML IJ SOLN
80.0000 mg | Freq: Once | INTRAMUSCULAR | Status: AC
  Administered 2017-07-23: 80 mg via INTRAVENOUS
  Filled 2017-07-23: qty 8

## 2017-07-23 NOTE — Progress Notes (Signed)
Brandon [name] for heparin Indication: atrial fibrillation  Allergies  Allergen Reactions  . Bee Venom Anaphylaxis and Swelling    Patient Measurements: Height: 6\' 2"  (188 cm) (measured) Weight: 237 lb 3.4 oz (107.6 kg) IBW/kg (Calculated) : 82.2   Vital Signs: Temp: 97.7 F (36.5 C) (08/12 0330) Temp Source: Oral (08/12 0330) BP: 94/75 (08/12 0630) Pulse Rate: 126 (08/12 0630)  Labs:  Recent Labs  07/21/17 0412 07/21/17 0737 07/21/17 1302 07/22/17 0316 07/23/17 0410  HGB 9.0*  --   --  8.4* 8.3*  HCT 28.6*  --   --  27.5* 27.4*  PLT 261  --   --  213 235  HEPARINUNFRC  --  0.43  --  0.41 0.49  CREATININE  --   --  2.22* 1.99* 1.74*    Estimated Creatinine Clearance: 50.9 mL/min (A) (by C-G formula based on SCr of 1.74 mg/dL (H)).   Medical History: Past Medical History:  Diagnosis Date  . Anemia 07/13/2017  . Anxiety   . Aortic insufficiency   . Aortic stenosis, moderate 07/13/2017  . Arthritis    back   . Carotid stenosis    Right carotid stent (widely patent) 40 - 59% left plaque 11/13  . Depression   . Dyslipidemia   . GERD (gastroesophageal reflux disease)   . Heart murmur   . Hemiplegia affecting unspecified side, late effect of cerebrovascular disease    resolved- from L side   . Hypertension   . Jaundice    resolved following ERCP & Cholecystectomy  . Mitral valve insufficiency and aortic valve insufficiency   . Pre-diabetes    per spouse  . Sleep apnea    does not wear CPAP  . Sleep concern    resulted in surgery- after + sleep test. Pt. doesn't have a problem any longer.   . Stroke ([place]) 03/11/2003   stent placed on the 31, 3, 2004, L side   . Wears glasses   . Wears hearing aid in both ears   . Wears partial dentures    Assessment: 49 yoM with a hx of CVA, HTN, HL, AS, MR, [name], tobacco use, remote hx colon polyps, admitted for the evaluation of CP, diaphoresis, elevated troponin. Pt is  on heparin for AFib, level remains therapeutic today at 0.49 but upper end of goal, Hgb low stable and pltc wnl.  Goal of Therapy:  Heparin level 0.3-0.5 units/ml - will aim for lower goal with low anemia Monitor platelets by anticoagulation protocol: Yes   Plan:  -Reduce heparin slightly to 1150 units/hr -Daily HL, CBC, S/Sx bleeding  Arrie Senate, PharmD PGY-2 Cardiology Pharmacy Resident Pager: [phone] 07/23/2017

## 2017-07-23 NOTE — Progress Notes (Signed)
Patient ID: Brandon [name], male   DOB: 1946/11/02, 71 y.o.   MRN: [id number]     Advanced Heart Failure Rounding Note   Subjective:    8/3 developed progressive respiratory distress => Bipap => intubation.  With sedation, became hypotensive and bradycardic, dopamine + phenylephrine started.  Developed AKI. Hyperkalemia treated with HCO3 gtt and Kayexalate.  Also with metabolic acidosis with elevated lactate (HCO3 gtt begun).  LFTs rose to the 1000s range.  Vancomycin/Zosyn initially started empirically (?PNA component) => stopped. Antibiotics restarted 8/8 => vanco/cefepime.   CXR with bibasilar airspace disease, stable.   Failed vent wean yesterday, sedation weaned again this morning. Moves around but does not open eyes or follow commands. BUN/creatinine coming down some. Sodium remains elevated, getting free water boluses. CVP 10 today. Weight up 2 lbs.   Afib/RVR started again, now HR in 120s.   ECHO (5/18): EF 55-60%, moderate AS, moderate AI, moderate MR.   TTE (8/18): EF 55-60%, moderate LVH, moderate AS mean 33, moderate AI, moderate-severe MR  Objective:   Weight Range: 237 lb 3.4 oz (107.6 kg) Body mass index is 30.46 kg/m.   Vital Signs:   Temp:  [97.4 F (36.3 C)-98.9 F (37.2 C)] 98.5 F (36.9 C) (08/12 0820) Pulse Rate:  [42-140] 125 (08/12 0900) Resp:  [0-21] 16 (08/12 0900) BP: (71-146)/(55-94) 98/72 (08/12 0900) SpO2:  [95 %-100 %] 97 % (08/12 0900) FiO2 (%):  [30 %] 30 % (08/12 0814) Weight:  [237 lb 3.4 oz (107.6 kg)] 237 lb 3.4 oz (107.6 kg) (08/12 0500) Last BM Date: 07/23/17  Weight change: Filed Weights   07/21/17 0500 07/22/17 0500 07/23/17 0500  Weight: 237 lb 14 oz (107.9 kg) 235 lb 7.2 oz (106.8 kg) 237 lb 3.4 oz (107.6 kg)    Intake/Output:   Intake/Output Summary (Last 24 hours) at 07/23/17 0952 Last data filed at 07/23/17 0800  Gross per 24 hour  Intake          3767.98 ml  Output             2515 ml  Net          1252.98 ml       Physical Exam  CVP 10 General: Intubated. Sedated.  HEENT: ETT Neck: Thick, JVP 10 cm.  No lymphadenopathy or thryomegaly appreciated. Cor: PMI nondisplaced. Irregular, tachycardic. No rubs, gallops or murmurs. Lungs: coarse throughout  Abdomen: obese, soft, nontender, nondistended. No hepatosplenomegaly. No bruits or masses. Good bowel sounds. Extremities: no cyanosis, clubbing, rash, edema Neuro: intubated. Does not open eyes. Withdraws from pain stimulation to feet  GU: foley   Telemetry   Personally reviewed, atrial fibrillation in 120s.   Labs    CBC  Recent Labs  07/21/17 0412 07/22/17 0316 07/23/17 0410  WBC 20.2* 19.5* 18.5*  NEUTROABS 16.4*  --   --   HGB 9.0* 8.4* 8.3*  HCT 28.6* 27.5* 27.4*  MCV 96.0 97.5 98.9  PLT 261 213 517   Basic Metabolic Panel  Recent Labs  07/22/17 0316 07/23/17 0410  NA 153* 151*  K 3.4* 3.8  CL 114* 117*  CO2 28 25  GLUCOSE 141* 126*  BUN 131* 103*  CREATININE 1.99* 1.74*  CALCIUM 8.7* 8.5*   Liver Function Tests  Recent Labs  07/21/17 0109  AST 86*  ALT 621*  ALKPHOS 57  BILITOT 1.0  PROT 5.7*  ALBUMIN 2.9*   No results for input(s): LIPASE, AMYLASE in the last 72 hours. Cardiac Enzymes  No results for input(s): CKTOTAL, CKMB, CKMBINDEX, TROPONINI in the last 72 hours.  BNP: BNP (last 3 results)  Recent Labs  07/13/17 1341  BNP 408.4*    ProBNP (last 3 results) No results for input(s): PROBNP in the last 8760 hours.   D-Dimer No results for input(s): DDIMER in the last 72 hours. Hemoglobin A1C No results for input(s): HGBA1C in the last 72 hours. Fasting Lipid Panel  Recent Labs  07/21/17 0737  TRIG 121   Thyroid Function Tests No results for input(s): TSH, T4TOTAL, T3FREE, THYROIDAB in the last 72 hours.  Invalid input(s): FREET3  Other results:   Imaging    Dg Chest Port 1 View  Result Date: 07/23/2017 CLINICAL DATA:  Acute respiratory failure. EXAM: PORTABLE CHEST 1  VIEW COMPARISON:  One-view chest x-ray 07/22/2017 FINDINGS: Endotracheal tube is stable at 4.5 cm above the carina. The NG tube courses off the inferior border the film. A right IJ line is stable. Cardiomegaly aunt mild edema is stable. Bilateral pleural effusions and basilar airspace disease is unchanged. IMPRESSION: 1. No significant interval change in cardiomegaly, mild edema, and bilateral pleural effusions. 2. Support apparatus is stable. 3. Stable bibasilar airspace disease likely reflects atelectasis. Electronically Signed   By: San Morelle M.D.   On: 07/23/2017 07:14     Medications:     Scheduled Medications: . arformoterol  15 mcg Nebulization BID  . budesonide (PULMICORT) nebulizer solution  0.5 mg Nebulization BID  . chlorhexidine gluconate (MEDLINE KIT)  15 mL Mouth Rinse BID  . Chlorhexidine Gluconate Cloth  6 each Topical Daily  . clonazePAM  0.5 mg Per Tube BID  . docusate  100 mg Per Tube BID  . feeding supplement (PRO-STAT SUGAR FREE 64)  60 mL Per Tube 5 X Daily  . feeding supplement (VITAL HIGH PROTEIN)  1,000 mL Per Tube Q24H  . fentaNYL (SUBLIMAZE) injection  50 mcg Intravenous Once  . free water  250 mL Per Tube Q4H  . furosemide  80 mg Intravenous Once  . insulin aspart  0-20 Units Subcutaneous Q4H  . mouth rinse  15 mL Mouth Rinse 10 times per day  . pantoprazole sodium  40 mg Per Tube QHS  . polyethylene glycol  17 g Oral Daily  . predniSONE  20 mg Per Tube Daily  . sodium chloride flush  10-40 mL Intracatheter Q12H  . sodium chloride flush  3 mL Intravenous Q12H  . sodium chloride flush  3 mL Intravenous Q12H    Infusions: . sodium chloride    . sodium chloride    . sodium chloride 10 mL/hr at 07/23/17 0800  . amiodarone 30 mg/hr (07/23/17 0800)  . amiodarone    . ceFEPime (MAXIPIME) IV Stopped (07/22/17 1053)  . diltiazem (CARDIZEM) infusion Stopped (07/20/17 0641)  . fentaNYL infusion INTRAVENOUS 25 mcg/hr (07/23/17 0800)  . heparin 1,150  Units/hr (07/23/17 0800)  . norepinephrine (LEVOPHED) Adult infusion Stopped (07/19/17 0544)  . propofol (DIPRIVAN) infusion 5 mcg/kg/min (07/23/17 0800)  . vancomycin Stopped (07/22/17 1240)    PRN Medications: sodium chloride, sodium chloride, acetaminophen, fentaNYL, metoprolol tartrate, midazolam, nitroGLYCERIN, ondansetron (ZOFRAN) IV, senna, sodium chloride flush, sodium chloride flush, sodium chloride flush    Patient Profile   71 yo with valvular heart disease and smoking/suspected COPD, CVA with carotid stenting was admitted with dyspnea/respiratory distress.   Assessment/Plan   1. Acute hypoxemic respiratory failure: There has been a component of pulmonary edema but CVP now down to 6.  Antibiotics (vanco/cefepime) for suspected PNA.  He is on prednisone for ? component of COPD exacerbation.  - Failed vent wean again yesterday, sedation weaned to try again today.  His wife says he would not want trach.   2. Acute on chronic diastolic CHF: Baseline has at least moderate valvular disease (moderate AS, moderate AI, moderate-severe MR).  Suspect diastolic CHF triggered by poorly tolerated paroxysmal atrial fibrillation in setting of valvular disease.  Admission thought to be triggered by diastolic CHF/pulmonary edema.   He developed AKI with lactic acidosis in the setting of hypotension with sedation.  CVP 10. Renal function improved today but BUN still very high. - Will give Lasix 80 mg IV x 1 today.   - Left/right heart cath if recovers.  3. Shock: Probably mixed cardiogenic/vasodilatory.  Developed in the setting of sedation with Bipap then intubation as well as bradycardia with sedation. Off norepinephrine now.   4. AKI: With hyperkalemia (treated) in setting of shock. Suspect ATN.  Creatinine 1.9>1.7 >2.04 > 1.99 > 1.74.  BUN still remains very high.  Poor mental status may be due at least in part to uremia. They do not want even short term HD.  5. Elevated LFTs: Up to 1000s.   Suspect shock liver in setting of hypotension. LFTs trending down.   6. Atrial fibrillation: Paroxysmal. Back in atrial fibrillation with RVR today.   - Bolus amiodarone gtt and increase to 60 mg/hr.   - Continue heparin drip.   7. Anemia: Recent fall in hgb to 8s range but has been stable here.  FOBT negative 8/6.  Fe deficient, got feraheme.  No overt GI bleeding.  PPI - Will eventually need GI workup if recovers.  8. ID: Blx CX NGTD. Possible PNA.  On vancomycin/cefepime.  WBCs coming down.  9. COPD: Suspected.  Active smoker.  On prednisone to cover exacerbation.  10. Valvular heart disease: Echo this admission with moderate AS, moderate AI, moderate to severe MR.  May need reassessment with TEE.  May eventually need consideration of surgical valve repair/replacement with Maze given poorly tolerated afib. 11. Hypernatremia: 151 today, getting free water boluses.  12. Neuro: Altered mental status, does not respond to commands or open eyes.  ?Uremia + infection, ?component of ETOH withdrawal.  13. Limited Code.  Would continue aggressive care through weekend, consider comfort care Monday if no progress.   Palliative Care following.   Length of Stay: 9  Loralie Champagne, MD  07/23/2017, 9:52 AM  Advanced Heart Failure Team Pager [phone] (M-F; 7a - 4p)  Please contact [facility] for night-coverage after hours (4p -7a ) and weekends on [email or web address]

## 2017-07-23 NOTE — Progress Notes (Signed)
Elink and cards notified regarding conversion back into AFIB w/ systolic bp's in the 06'J. Cards Dr. Radford Pax ordered for 259ml NS bolus. Will continue to monitor closely. Eleonore Chiquito RN 2 Heart

## 2017-07-23 NOTE — Progress Notes (Signed)
Pharmacy Antibiotic Note  Brandon [name] is a 71 y.o. male currently on vancomycin and cefepime for HCAP now to switch to cipro. Renal function improving with Cr down to 1.74 and CrCl ~ 1ml/min.  Plan: 1) Cipro 400mg  IV q12  Height: 6\' 2"  (188 cm) (measured) Weight: 237 lb 3.4 oz (107.6 kg) IBW/kg (Calculated) : 82.2  Temp (24hrs), Avg:98.2 F (36.8 C), Min:97.4 F (36.3 C), Max:98.9 F (37.2 C)   Recent Labs Lab 07/17/17 0405  07/19/17 0332 07/20/17 0331 07/21/17 0109 07/21/17 0412 07/21/17 1302 07/22/17 0316 07/23/17 0410  WBC  --   < > 19.6* 18.3*  --  20.2*  --  19.5* 18.5*  CREATININE 1.98*  < > 1.74* 2.04* 2.22*  --  2.22* 1.99* 1.74*  LATICACIDVEN 2.1*  --   --   --   --   --   --   --   --   < > = values in this interval not displayed.  Estimated Creatinine Clearance: 50.9 mL/min (A) (by C-G formula based on SCr of 1.74 mg/dL (H)).    Allergies  Allergen Reactions  . Bee Venom Anaphylaxis and Swelling    Antimicrobials this admission: 8/9 Cefepime >>8/12 8/10 Vancomycin >>8/12 8/12 Cipro >>  Microbiology results: 8/2 MRSA: negative 8/3 BCx: NGTD 8/8 TA: few S. Aureus, few Enterobacter - both S to cipro  Thank you for allowing pharmacy to be a part of this patient's care.  Deboraha Sprang 07/23/2017 4:18 PM

## 2017-07-23 NOTE — Progress Notes (Signed)
Pt attempted to wean, RT an this RN at [facility], wife and son at [facility], pt RASS 2, pt not given fent bolus per request of pt wife,   at time of wean RASS increased to 3 and not breathing cont by self, pt thrashing around bed during this time. Total of 70 fent bolus given during weaning trial pt cont to trash around bed and not cont to wean on own. Weaning trial stopped by RT and deemed unsuccessful, fent increased to 50 by this RN. Current RASS is -2. Will attempt to decrease Fent with next round.

## 2017-07-23 NOTE — Progress Notes (Signed)
PULMONARY / CRITICAL CARE MEDICINE   Name: Brandon [name] MRN: [id number] DOB: 1946/10/02    ADMISSION DATE:  07/13/2017 CONSULTATION DATE:  07/14/17 REFERRING MD:  Dr. Acie Fredrickson   CHIEF COMPLAINT:  Respiratory distress  BRIEF: 71 y/o heavy smoker admitted with acute respiratory failure with hypoxemia in setting of afib with RVR and volume overload.  Had confusion not long after admission, hypotensive with precedex.  Has acute on chronic renal failure. Intubated for respiratory failure on 8/3.   SUBJECTIVE:   NO acute events overnight. Not following commands.  Family discussion .[name] Wife updated at bedside. Advised if not progressing or unable to wean off vent, would not want to have trach.   VITAL SIGNS: BP 98/72   Pulse (!) 125   Temp 98.5 F (36.9 C) (Oral)   Resp 16   Ht 6\' 2"  (1.88 m) Comment: measured  Wt 237 lb 3.4 oz (107.6 kg)   SpO2 97%   BMI 30.46 kg/m   HEMODYNAMICS: CVP:  [6 mmHg-10 mmHg] 10 mmHg  VENTILATOR SETTINGS: Vent Mode: PRVC FiO2 (%):  [30 %] 30 % Set Rate:  [20 bmp] 20 bmp Vt Set:  [893 mL] 660 mL PEEP:  [5 cmH20] 5 cmH20 Plateau Pressure:  [18 cmH20-19 cmH20] 19 cmH20  INTAKE / OUTPUT: I/O last 3 completed shifts: In: 5067.7 [I.V.:2207.7; Other:30; NG/GT:2530; IV [id number]:585] Out: 2778 [EUMPN:3614]   PHYSICAL EXAMINATION: General: elderly male in NAD on vent   HEENT: MM pink/moist, no jvd Neuro: moves to stimulation, no follow commands  CV: s1s2 irr irr, 2/6 SEM, no m/r/g PULM: even/non-labored, lungs bilaterally clear ER:XVQM, non-tender, bsx4 active  Extremities: warm/dry, no edema  Skin: no rashes or lesions  LABS:  BMET  Recent Labs Lab 07/21/17 1302 07/22/17 0316 07/23/17 0410  NA 150* 153* 151*  K 3.6 3.4* 3.8  CL 112* 114* 117*  CO2 28 28 25   BUN 135* 131* 103*  CREATININE 2.22* 1.99* 1.74*  GLUCOSE 134* 141* 126*    Electrolytes  Recent Labs Lab 07/17/17 0405 07/18/17 0900 07/19/17 0332  07/21/17 1302  07/22/17 0316 07/23/17 0410  CALCIUM 8.0* 8.6* 9.0  < > 8.6* 8.7* 8.5*  MG 2.6* 2.6* 2.5*  --   --   --   --   PHOS  --   --  5.2*  --   --   --   --   < > = values in this interval not displayed.  CBC  Recent Labs Lab 07/21/17 0412 07/22/17 0316 07/23/17 0410  WBC 20.2* 19.5* 18.5*  HGB 9.0* 8.4* 8.3*  HCT 28.6* 27.5* 27.4*  PLT 261 213 235    Coag's  Recent Labs Lab 07/17/17 0405 07/18/17 0945  INR 1.67 1.65    Sepsis Markers  Recent Labs Lab 07/17/17 0405  LATICACIDVEN 2.1*    ABG No results for input(s): PHART, PCO2ART, PO2ART in the last 168 hours.  Liver Enzymes  Recent Labs Lab 07/18/17 0440 07/20/17 0331 07/21/17 0109  AST 294* 103* 86*  ALT 1,799* 864* 621*  ALKPHOS 66 64 57  BILITOT 1.0 1.0 1.0  ALBUMIN 2.8* 3.1* 2.9*    Cardiac Enzymes No results for input(s): TROPONINI, PROBNP in the last 168 hours.  Glucose  Recent Labs Lab 07/22/17 0826 07/22/17 1201 07/22/17 1641 07/22/17 1959 07/23/17 0354 07/23/17 0758  GLUCAP 139* 164* 156* 134* 123* 128*    Imaging Dg Chest Port 1 View  Result Date: 07/23/2017 CLINICAL DATA:  Acute respiratory failure. EXAM:  PORTABLE CHEST 1 VIEW COMPARISON:  One-view chest x-ray 07/22/2017 FINDINGS: Endotracheal tube is stable at 4.5 cm above the carina. The NG tube courses off the inferior border the film. A right IJ line is stable. Cardiomegaly aunt mild edema is stable. Bilateral pleural effusions and basilar airspace disease is unchanged. IMPRESSION: 1. No significant interval change in cardiomegaly, mild edema, and bilateral pleural effusions. 2. Support apparatus is stable. 3. Stable bibasilar airspace disease likely reflects atelectasis. Electronically Signed   By: San Morelle M.D.   On: 07/23/2017 07:14     STUDIES:  04/27/17  ECHO >> LV moderately dilated, LVEF 55-60%, moderate AS, mild to mod AR, moderate to severe MR, LA moderately dilated, PA Peak pressure 32.  LUE duplex 8/7 >  superficial thrombosis in the left basilic vein  CULTURES: Blood 8/3 >> negative Respiratory 8/4 >>  Not sent MRSA screen 8/2 >> negative Resp 8/8 >> few staph aureus >>  ANTIBIOTICS: Vanco 8/3 >> 8/5 Zosyn 8/3 >> 8/6 Cefepime 8/9 >>  Vanco 8/10 >>   SIGNIFICANT EVENTS: 8/02  Admit with SOB, AFwRVR 8/03  PCCM consulted for evaluation of respiratory distress, intubated 8/09  Diuresing, Sr Cr increased, rate control improved  8/11  Not waking, off diuretics,    LINES/TUBES: ETT 8/3 >>  R IJ CVC 8/3 >>   DISCUSSION: 71 y/o M, smoker, with PMH of HTN, PVD admitted on 8/2 with SOB and AF w RVR.  Intubated on 8/3 in setting of agitation, respiratory failure and bradycardia/hypotension on precedex.  He has acute pulmonary edema from diastolic heart failure and valvular heart disease (moderate AS/AI and mod to severe MR)  ASSESSMENT / PLAN:  PULMONARY A: Acute respiratory failure with hypoxemia Tobacco abuse Possible COPD with acute exacerbation Acute pulmonary edema Bilateral pleural effusions P:   PRVC 8 cc/kg  Wean PEEP / FiO2 for sats > 92% Daily WUA / SBT  VAP prevention measures  Prednisone 20 mg QD, stop date 8/12 Continue Brovana + Pulmicort  Intermittent CXR  CARDIOVASCULAR A:  Cardiogenic shock Afib with RVR Acute decompensated diastolic heart failure Hx AS/AR, MR Bradycardia on precedex P:  Tele monitoring  Antiarrythmic's per Cardiology > amiodarone / cardizem Heparin gtt per pharmacy   RENAL A:   AKI  Hypernatremia  Hypokalemia  P:   Monitor BMET and UOP Replace electrolytes as needed Given volume overload state would continue diuresis as we are doing Free water 250 ml Q4  Defer diuresis to Cardiology   GASTROINTESTINAL A:   Shock liver - LFT's improving Stress ulcer prophylaxis Nutrition P:   TF per nutrition  PPI for SUP  Trend LFT's   HEMATOLOGIC A:   Anemia  Leukocytosis due to solumedrol P:  Trend CBC  Heparin gtt per  pharmacy   INFECTIOUS A:   GPC in sputum, possible HCAP but favor CXR findings are pulmonary edema Thick tracheal secretions, wife says this is typical for him P:   Abx as above, narrow as able  Follow cultures   ENDOCRINE A:   Hyperglycemia P:   SSI   NEUROLOGIC A:   Acute Metabolic Encephalopathy - > improving History of alcohol use, at risk withdrawal (though note, only drinks 1 beer every 3-4 days) Start clonazepam Severe agitation on wake up assessment 8/8 P:   RASS goal - 0 to -1  Propofol low dose for sedation  Attempt to minimize fentanyl unless agitation to allow for neuro assessment  Reduce klonopin to 0.5 BID > consider  d/c all together with AKI / poor clearance, not on benzo's at home Consider PRN sedation only  FAMILY  - Updates: LCB.  Wife updated at bedside 8/12  Tammy Parrett NP-C  [facility]  [phone]   07/23/2017, 10:05 AM

## 2017-07-23 NOTE — Progress Notes (Signed)
125 mL of Fentanyl wasted in sink. Witnessed by Everlena Cooper, RN.

## 2017-07-24 LAB — CBC
HEMATOCRIT: 26.9 % — AB (ref 39.0–52.0)
Hemoglobin: 8.2 g/dL — ABNORMAL LOW (ref 13.0–17.0)
MCH: 29.9 pg (ref 26.0–34.0)
MCHC: 30.5 g/dL (ref 30.0–36.0)
MCV: 98.2 fL (ref 78.0–100.0)
PLATELETS: 256 10*3/uL (ref 150–400)
RBC: 2.74 MIL/uL — AB (ref 4.22–5.81)
RDW: 18.6 % — AB (ref 11.5–15.5)
WBC: 20.3 10*3/uL — AB (ref 4.0–10.5)

## 2017-07-24 LAB — BASIC METABOLIC PANEL
Anion gap: 12 (ref 5–15)
BUN: 99 mg/dL — ABNORMAL HIGH (ref 6–20)
CALCIUM: 8.6 mg/dL — AB (ref 8.9–10.3)
CO2: 23 mmol/L (ref 22–32)
CREATININE: 1.84 mg/dL — AB (ref 0.61–1.24)
Chloride: 112 mmol/L — ABNORMAL HIGH (ref 101–111)
GFR, EST AFRICAN AMERICAN: 41 mL/min — AB (ref >60)
GFR, EST NON AFRICAN AMERICAN: 35 mL/min — AB (ref >60)
Glucose, Bld: 154 mg/dL — ABNORMAL HIGH (ref 65–99)
Potassium: 3.6 mmol/L (ref 3.5–5.1)
SODIUM: 147 mmol/L — AB (ref 135–145)

## 2017-07-24 LAB — GLUCOSE, CAPILLARY
GLUCOSE-CAPILLARY: 106 mg/dL — AB (ref 65–99)
GLUCOSE-CAPILLARY: 134 mg/dL — AB (ref 65–99)
GLUCOSE-CAPILLARY: 158 mg/dL — AB (ref 65–99)
GLUCOSE-CAPILLARY: 163 mg/dL — AB (ref 65–99)
GLUCOSE-CAPILLARY: 165 mg/dL — AB (ref 65–99)
Glucose-Capillary: 124 mg/dL — ABNORMAL HIGH (ref 65–99)

## 2017-07-24 LAB — HEPATIC FUNCTION PANEL
ALT: 204 U/L — ABNORMAL HIGH (ref 17–63)
AST: 55 U/L — ABNORMAL HIGH (ref 15–41)
Albumin: 2.4 g/dL — ABNORMAL LOW (ref 3.5–5.0)
Alkaline Phosphatase: 41 U/L (ref 38–126)
BILIRUBIN DIRECT: 0.3 mg/dL (ref 0.1–0.5)
BILIRUBIN TOTAL: 0.8 mg/dL (ref 0.3–1.2)
Indirect Bilirubin: 0.5 mg/dL (ref 0.3–0.9)
Total Protein: 5.5 g/dL — ABNORMAL LOW (ref 6.5–8.1)

## 2017-07-24 LAB — MAGNESIUM: MAGNESIUM: 2.8 mg/dL — AB (ref 1.7–2.4)

## 2017-07-24 LAB — HEPARIN LEVEL (UNFRACTIONATED): Heparin Unfractionated: 0.38 IU/mL (ref 0.30–0.70)

## 2017-07-24 MED ORDER — FENTANYL CITRATE (PF) 100 MCG/2ML IJ SOLN
INTRAMUSCULAR | Status: AC
  Filled 2017-07-24: qty 2

## 2017-07-24 MED ORDER — FENTANYL CITRATE (PF) 100 MCG/2ML IJ SOLN
25.0000 ug | INTRAMUSCULAR | Status: DC | PRN
  Administered 2017-07-24: 100 ug via INTRAVENOUS
  Filled 2017-07-24: qty 2

## 2017-07-24 MED ORDER — LEVALBUTEROL HCL 0.63 MG/3ML IN NEBU
0.6300 mg | INHALATION_SOLUTION | Freq: Four times a day (QID) | RESPIRATORY_TRACT | Status: DC
  Administered 2017-07-24 – 2017-07-25 (×4): 0.63 mg via RESPIRATORY_TRACT
  Filled 2017-07-24 (×4): qty 3

## 2017-07-24 MED ORDER — IPRATROPIUM BROMIDE 0.02 % IN SOLN
0.5000 mg | Freq: Four times a day (QID) | RESPIRATORY_TRACT | Status: DC
  Administered 2017-07-24 – 2017-07-25 (×4): 0.5 mg via RESPIRATORY_TRACT
  Filled 2017-07-24 (×4): qty 2.5

## 2017-07-24 NOTE — Plan of Care (Signed)
Problem: Education: Goal: Knowledge of [facility] Education information/materials will improve Outcome: Progressing Family education ongoing  Problem: Health Behavior/Discharge Planning: Goal: Ability to manage health-related needs will improve Outcome: Not Progressing Pt remains intubated and sedated.   Problem: Pain Managment: Goal: General experience of comfort will improve Outcome: Progressing Sedation needs decreased

## 2017-07-24 NOTE — Progress Notes (Signed)
Brandon [name] for heparin Indication: atrial fibrillation  Allergies  Allergen Reactions  . Bee Venom Anaphylaxis and Swelling    Patient Measurements: Height: 6\' 2"  (188 cm) (measured) Weight: 240 lb 8.4 oz (109.1 kg) IBW/kg (Calculated) : 82.2   Vital Signs: Temp: 98.3 F (36.8 C) (08/13 0906) Temp Source: Oral (08/13 0906) BP: 88/51 (08/13 1145) Pulse Rate: 97 (08/13 1145)  Labs:  Recent Labs  07/22/17 0316 07/23/17 0410 07/24/17 0201  HGB 8.4* 8.3* 8.2*  HCT 27.5* 27.4* 26.9*  PLT 213 235 256  HEPARINUNFRC 0.41 0.49 0.38  CREATININE 1.99* 1.74* 1.84*    Estimated Creatinine Clearance: 48.4 mL/min (A) (by C-G formula based on SCr of 1.84 mg/dL (H)).  Assessment: 83 yoM with a hx of CVA, HTN, HL, AS, MR, [name], tobacco use, remote hx colon polyps, admitted for the evaluation of CP, diaphoresis, elevated troponin.   Pt continues on heparin for AFib, level remains therapeutic today at 0.38 , Hgb low stable and pltc wnl.  Goal of Therapy:  Heparin level 0.3-0.5 units/ml - will aim for lower goal with low anemia Monitor platelets by anticoagulation protocol: Yes   Plan:  -Continue heparin at 1150 units/hr -Daily HL, CBC, S/Sx bleeding  Erin Hearing PharmD., BCPS Clinical Pharmacist Pager [phone] 07/24/2017 11:59 AM

## 2017-07-24 NOTE — Plan of Care (Signed)
  Interdisciplinary Goals of Care Family Meeting   Date carried out:: 07/24/2017  Location of the meeting: Bedside  Member's involved: Physician, Bedside Registered Nurse and Family Member or next of kin  Durable Power of Attorney or acting medical decision maker: Wife    Discussion: We discussed goals of care for [place] .  Patient's wife and other family members updated extensively at bedside. Explained the patient's clinical status seems to be improving somewhat overall. Neurologic status is main barrier to extubation and further recovery. Patient's wife is concerned regarding his previous discussions in reference to goals of care. She is going to discuss his current clinical state further with palliative medicine today. We did discuss continuing to adjust his medications with him that his neurologic recovery may continue. She is certain that he would not wish to undergo tracheostomy.  Code status: Limited Code or DNR with short term  Disposition: Continue current acute care  Time spent for the meeting: 12 minutes  Tera Partridge 07/24/2017, 10:01 AM

## 2017-07-24 NOTE — Progress Notes (Signed)
07/24/2017 1630 Pt. Failed second attempt to wean ventilator at 1600 due to decreased respiratory effort per RT and placed back on full support. Post attempt, pt. Became very agitated, reaching for ETT. HR Afib 110-120. BP. 170/80. RR 40-50 with accessory muscle use. Precedex titrated up per orders to help maintain RASS goal. PRN versed 4 mg administered IV to assist with agitation. Post administration, pt. Still very agitated and combative, reaching for ETT RR 40-50. E Link notified and orders to increase rate of Precedex, prn fentanyl push and bilateral soft wrist restraints ordered and enacted. Family at bedside and educated on need to apply restraints at this time.  1700: RASS -1, VSS, RR 20-25. Will continue to closely monitor patient.  Seylah Wernert, [name]

## 2017-07-24 NOTE — Progress Notes (Signed)
[facility] Progress Note Patient Name: Brandon [name] DOB: 04-Nov-1946 MRN: [id number]   Date of Service  07/24/2017  HPI/Events of Note  Agitation - Nursing request for bilateral soft wrist restraints and more sedation.   eICU Interventions  Will order: 1. Bilateral soft wrist restraints. 2. Fentanyl 25-100 mcg IV Q 2 hours PRN pain or sedation. 3. Increase ceiling on Precedex IV infusion to 1.7 mcg/kg/hour.      Intervention Category Minor Interventions: Agitation / anxiety - evaluation and management  Lysle Dingwall 07/24/2017, 4:38 PM

## 2017-07-24 NOTE — Progress Notes (Signed)
Nutrition Follow-up  DOCUMENTATION CODES:   Obesity unspecified  INTERVENTION:   Continue:  Vital High Protein @ 15 ml/hr 60 ml Prostat five times per day Provides: 1360 kcal, 181 grams protein, and 300 ml free water.  Total free water: 1800 ml  NUTRITION DIAGNOSIS:   Inadequate oral intake related to inability to eat as evidenced by NPO status. Ongoing.   GOAL:   Provide needs based on ASPEN/SCCM guidelines  Met.   MONITOR:   I & O's, Vent status  ASSESSMENT:   Pt with PMH of smoker since age 37, HTN, PVD was admitted 8/2 with SOB and AF w RVR. Pt developed respiratory failure and was intubated 8/3.   Patient is currently intubated on ventilator support Family meeting today with CCM, continue current care. Mental status barrier to extubation.   Medications reviewed and include: colace, SSI, levophed @ 15 ml/hr, thiamine  Free water 250 ml every 4 hours  Labs reviewed: Na 147 (H), magnesium 2.8 (H) CBG's: [phone]   Diet Order:  Diet NPO time specified  Skin:   (DTI.)  Last BM:  rectal tube in place, no output recorded last 24 hours  Height:   Ht Readings from Last 1 Encounters:  07/14/17 '6\' 2"'  (1.88 m)    Weight:   Wt Readings from Last 1 Encounters:  07/24/17 240 lb 8.4 oz (109.1 kg)    Ideal Body Weight:  86.3 kg  BMI:  Body mass index is 30.88 kg/m.  Estimated Nutritional Needs:   Kcal:  2233-6122  Protein:  >/= 172 grams  Fluid:  >2 L/day  EDUCATION NEEDS:   No education needs identified at this time  [place], [place], [place] Pager [phone] After Hours Pager

## 2017-07-24 NOTE — Progress Notes (Signed)
Daily Progress Note   Patient Name: Brandon [name]       Date: 07/24/2017 DOB: 1946/03/15  Age: 71 y.o. MRN#: [id number] Attending Physician: Brandon Headings, MD Primary Care Physician: Brandon Small, MD Admit Date: 07/13/2017  Reason for Consultation/Follow-up: Establishing goals of care  Subjective: Brandon [name] is now sedated after becoming agitated this morning with weaning trial. Family is at bedside.   Length of Stay: 10  Current Medications: Scheduled Meds:  . budesonide (PULMICORT) nebulizer solution  0.5 mg Nebulization BID  . chlorhexidine gluconate (MEDLINE KIT)  15 mL Mouth Rinse BID  . Chlorhexidine Gluconate Cloth  6 each Topical Daily  . docusate  100 mg Per Tube BID  . feeding supplement (PRO-STAT SUGAR FREE 64)  60 mL Per Tube 5 X Daily  . feeding supplement (VITAL HIGH PROTEIN)  1,000 mL Per Tube Q24H  . free water  250 mL Per Tube Q4H  . insulin aspart  0-20 Units Subcutaneous Q4H  . ipratropium  0.5 mg Nebulization Q6H  . levalbuterol  0.63 mg Nebulization Q6H  . mouth rinse  15 mL Mouth Rinse 10 times per day  . pantoprazole sodium  40 mg Per Tube QHS  . sodium chloride flush  10-40 mL Intracatheter Q12H  . sodium chloride flush  3 mL Intravenous Q12H  . sodium chloride flush  3 mL Intravenous Q12H    Continuous Infusions: . sodium chloride    . sodium chloride    . sodium chloride 10 mL/hr at 07/24/17 1200  . amiodarone 30 mg/hr (07/24/17 1211)  . ciprofloxacin 400 mg (07/24/17 0542)  . dexmedetomidine (PRECEDEX) IV infusion 0.6 mcg/kg/hr (07/24/17 1216)  . heparin 1,150 Units/hr (07/24/17 1200)  . norepinephrine (LEVOPHED) Adult infusion 3 mcg/min (07/24/17 1200)  . thiamine injection Stopped (07/24/17 1004)    PRN Meds: sodium chloride, sodium  chloride, fentaNYL, metoprolol tartrate, midazolam, nitroGLYCERIN, ondansetron (ZOFRAN) IV, senna, sodium chloride flush, sodium chloride flush, sodium chloride flush  Physical Exam         Constitutional: He appears well-developed. He has a sickly appearance. He is intubated.  HENT:  Head: Normocephalic and atraumatic.  Cardiovascular:  An irregularly irregular rhythm, bradycardia present.  Pulmonary/Chest: No tachypnea. He is intubated. No respiratory distress.  Abdominal: Soft. Normal appearance.  Neurological:  Agitated/restless but unresponsive,  not following any commands  Nursing note and vitals reviewed.  Vital Signs: BP (!) 95/44 (BP Location: Right Arm)   Pulse (!) 46   Temp 98.3 F (36.8 C) (Oral)   Resp 20   Ht _0  (1.88 m) Comment: measured  Wt 109.1 kg (240 lb 8.4 oz)   SpO2 96%   BMI 30.88 kg/m  SpO2: SpO2: 96 % O2 Device: O2 Device: Ventilator O2 Flow Rate: O2 Flow Rate (L/min): 40 L/min  Intake/output summary:  Intake/Output Summary (Last 24 hours) at 07/24/17 1223 Last data filed at 07/24/17 1200  Gross per 24 hour  Intake           3420.6 ml  Output             2515 ml  Net            905.6 ml   LBM: Last BM Date: 07/24/17 Baseline Weight: Weight: 109.8 kg (242 lb) Most recent weight: Weight: 109.1 kg (240 lb 8.4 oz)       Palliative Assessment/Data: 20%      Patient Active Problem List   Diagnosis Date Noted  . Pressure injury of skin 07/20/2017  . Ventilator dependence ([place])   . Goals of care, counseling/discussion   . Palliative care encounter   . Acute respiratory failure with hypoxemia ([place])   . Acute pulmonary edema ([facility])   . Acute diastolic heart failure ([place])   . Hypotensive episode   . Arrhythmia 07/13/2017  . Aortic stenosis, moderate 07/13/2017  . Anemia 07/13/2017  . Dyspnea 07/13/2017  . Respiratory distress 07/13/2017  . S/P left rotator cuff repair 05/18/2017  . Non-rheumatic mitral regurgitation   . HOH (hard of  hearing) 06/02/2013  . Obesity (BMI 30-39.9) 06/02/2013  . Acute cholecystitis with chronic cholecystitis 06/01/2013  . Calculus of bile duct without mention of cholecystitis or obstruction 05/31/2013  . Nonspecific elevation of levels of transaminase or lactic acid dehydrogenase (LDH) 05/31/2013  . Nonspecific (abnormal) findings on radiological and other examination of biliary tract 05/31/2013  . TOBACCO ABUSE 02/22/2010  . DYSLIPIDEMIA 06/25/2009  . MITRAL INSUFFICIENCY 06/25/2009  . Essential hypertension 06/25/2009  . Aortic valve disorder 06/25/2009  . VALVULAR HEART DISEASE 06/25/2009  . Cardiovascular disease 06/25/2009  . CEREBROVASCULAR DISEASE 06/25/2009  . CEREBROVASCULAR ACCIDENT WITH RIGHT HEMIPARESIS 06/25/2009  . COPD exacerbation ([place]) 06/25/2009  . FOOT SURGERY, HX OF 06/25/2009  . POLYPECTOMY, HX OF 06/25/2009  . TONSILLECTOMY, HX OF 06/25/2009    Palliative Care Assessment & Plan   HPI: 71 y.o. male  with past medical history of CVA, HTN, HL, AS, MR, [name], current tobacco use (since 71 yo), remote hx colon polyps admitted on 07/13/2017 with chest pain, diaphoresis, elevated troponin. Hospital stay complicated by afib RVR and volume overload leading to BiPAP and ultimately ventilator 8/3. Has continued to have issues with afib/tachycardia and agitation and has not made much progress on ventilator. Family questioning if these aggressive measures are what he would want and expectations with continuing aggressive support.   Assessment: I met again at bedside with wife, son, grandchildren. Katharine Look tells me that this weekend was difficult as she felt like she was getting conflicting information from nursing at times. Katharine Look agrees with PCCM recommendation to continue vent support through full 2 week trial as they are seeing some improved neurological response but inconsistently. They are still holding out hope but Katharine Look is firm that they would not desire  tracheostomy and they have  good understanding that he has multiple complicated health issues and barriers to recovery. At one point Brandon [name] did become agitated and reached for his ETT which shows some purpose in his action to go for his ETT. There is the possibility that his agitation is related more to ETT presence and I am not convinced that he could not be extubated successfully in the next few days. Therapeutic listening and emotional support provided.   Recommendations/Plan:  Per PCCM and cardiology.   Give full trial of 2 weeks to optimize success at extubation.   Family hopeful but also preparing that he will not survive this event.   Code Status:  Limited code - continue intubation for now  Prognosis:   Unable to determine  Discharge Planning:  To Be Determined  Care plan was discussed with Dr. Ashok Cordia  Thank you for allowing the Palliative Medicine Team to assist in the care of this patient.   Total Time 26mn Prolonged Time Billed  no       Greater than 50%  of this time was spent counseling and coordinating care related to the above assessment and plan.  AVinie Sill NP Palliative Medicine Team Pager # [phone](M-F 8a-5p) Team Phone # [phone](Nights/Weekends)

## 2017-07-24 NOTE — Progress Notes (Signed)
PULMONARY / CRITICAL CARE MEDICINE   Name: Brandon [name] MRN: [id number] DOB: 25-Feb-1946    ADMISSION DATE:  07/13/2017 CONSULTATION DATE:  07/14/17 REFERRING MD:  Dr. Acie Fredrickson   CHIEF COMPLAINT:  Respiratory distress  BRIEF HPI:  71 y.o. heavy smoker admitted with acute hypoxic respiratory failure along with atrial fibrillation with rapid ventricular response. Patient was found to be in volume overload. With confusion occurring after admission and hypotension on Precedex in the setting of acute on chronic renal failure patient was intubated on 8/3.  SUBJECTIVE:  Patient weaning on pressure support today but severely tachypneic. Started Precedex infusion yesterday.  REVIEW OF SYSTEMS:  Unable to obtain with intubation & altered mentation.   VITAL SIGNS: BP (!) 106/59   Pulse (!) 59   Temp 98.3 F (36.8 C) (Oral)   Resp 20   Ht 6\' 2"  (1.88 m) Comment: measured  Wt 240 lb 8.4 oz (109.1 kg)   SpO2 98%   BMI 30.88 kg/m   HEMODYNAMICS: CVP:  [5 mmHg-13 mmHg] 6 mmHg  VENTILATOR SETTINGS: Vent Mode: PRVC FiO2 (%):  [30 %] 30 % Set Rate:  [20 bmp] 20 bmp Vt Set:  [600 mL-660 mL] 600 mL PEEP:  [5 cmH20] 5 cmH20 Plateau Pressure:  [17 cmH20-35 cmH20] 24 cmH20  INTAKE / OUTPUT: I/O last 3 completed shifts: In: 5140.1 [I.V.:2960.1; NG/GT:1880; IV Piggyback:300] Out: 6294 [Urine:4830]   PHYSICAL EXAMINATION: General:  Eyes closed. No acute distress. Multiple family members at bedside.  Integument:  Warm & dry. No rash on exposed skin. HEENT:  Moist mucus membranes. No scleral injection or icterus. Endotracheal tube in place. Cardiovascular:  Irregular rhythm and mildly tachycardic. No appreciable JVD. Pulmonary:  Mild coarse wheeze bilaterally. Good aeration. Symmetric chest wall expansion on ventilator. Abdomen: Protuberant. Soft. Normal bowel sounds. Neurological: Intermittently moving left arm. Pupils symmetric. Not following commands.  LABS:  BMET  Recent Labs Lab  07/22/17 0316 07/23/17 0410 07/24/17 0201  NA 153* 151* 147*  K 3.4* 3.8 3.6  CL 114* 117* 112*  CO2 28 25 23   BUN 131* 103* 99*  CREATININE 1.99* 1.74* 1.84*  GLUCOSE 141* 126* 154*    Electrolytes  Recent Labs Lab 07/18/17 0900 07/19/17 0332  07/22/17 0316 07/23/17 0410 07/24/17 0201  CALCIUM 8.6* 9.0  < > 8.7* 8.5* 8.6*  MG 2.6* 2.5*  --   --   --  2.8*  PHOS  --  5.2*  --   --   --   --   < > = values in this interval not displayed.  CBC  Recent Labs Lab 07/22/17 0316 07/23/17 0410 07/24/17 0201  WBC 19.5* 18.5* 20.3*  HGB 8.4* 8.3* 8.2*  HCT 27.5* 27.4* 26.9*  PLT 213 235 256    Coag's  Recent Labs Lab 07/18/17 0945  INR 1.65    Sepsis Markers No results for input(s): LATICACIDVEN, PROCALCITON, O2SATVEN in the last 168 hours.  ABG No results for input(s): PHART, PCO2ART, PO2ART in the last 168 hours.  Liver Enzymes  Recent Labs Lab 07/18/17 0440 07/20/17 0331 07/21/17 0109  AST 294* 103* 86*  ALT 1,799* 864* 621*  ALKPHOS 66 64 57  BILITOT 1.0 1.0 1.0  ALBUMIN 2.8* 3.1* 2.9*    Cardiac Enzymes No results for input(s): TROPONINI, PROBNP in the last 168 hours.  Glucose  Recent Labs Lab 07/23/17 0758 07/23/17 1251 07/23/17 1553 07/23/17 2009 07/23/17 2341 07/24/17 0356  GLUCAP 128* 139* 163* 160* 151* 165*  Imaging No results found.  IMAGING/STUDIES: TTE 8/3:  LV moderately dilated with moderate concentric hypertrophy. EF 55-60% with akinesis of basal inferior myocardium and grade 2 diastolic dysfunction. LA moderately dilated & RA normal in size. RV normal in size and function. Moderate aortic regurgitation and moderate stenosis. Aortic root normal in size. Moderate to severe mitral regurgitation without stenosis. Trivial pulmonic regurgitation without stenosis. No tricuspid regurgitation. No pericardial effusion.  BILAT LE VENOUS DUPLEX 8/4:  Negative for DVT or SVT. LUE VENOUS DUPLEX 8/7:  SVT of basilic vein.  CT HEAD  W/O 8/9: IMPRESSION: 1. No acute intracranial process. 2. Large remote right MCA territory infarct. 3. Underlying generalized age-related cerebral atrophy. 4. Chronic left maxillary sinusitis. PORT CXR 8/11:  Previously reviewed by me. Right internal jugular central venous catheter in position. Endotracheal tube in good position. Enteric feeding tube course will diaphragm. Bilateral lower lung opacities consistent with pleural effusions vs consolidation present. PORT CXR 8/12:  Previously reviewed by me. Endotracheal tube in good position. Enteric feeding tube coursing below the diaphragm. Right internal jugular central venous catheter in good position. Bilateral lower lung opacities persist and are unchanged.  MICROBIOLOGY: MRSA PCR 8/2:  Negative  Blood Cultures x2 8/3:  Negative  Tracheal Aspirate Culture 8/8:  MSSA & Enterobacter   ANTIBIOTICS: Zosyn 8/3 - 8/6 Cefepime 8/9 - 8/12 Vancomycin 8/3 - 8/5; 8/10 - 8/12 Cipro 8/12 >>>  SIGNIFICANT EVENTS: 08/02 - Admit in A fib w/ RVR and volume overload 08/03 - PCCM consulted for respiratory distress & patient intubated 08/09 - Improving rate control but worsening renal function w/ diuresis 08/11 - Increasing movements but still not responding appropriately. Off diuretics. 08/12 - Antibiotics narrowed to Cipro given culture results & Klonopin stopped. Precedex started. 08/13 - Weaning on PS w/ tachypnea to 40s   LINES/TUBES: OETT 8/3 >>> R IJ CVL 8/3 >>> Foley >>> OGT >>> RECTAL TUBE >>> PIV  ASSESSMENT / PLAN:  PULMONARY A: Acute hypoxic respiratory failure Tobacco use disorder Probable COPD with acute exacerbation Bilateral pleural effusions History of OSA: Not currently on CPAP.  P:   Continuing full ventilator support Tapered off steroids Continue Pulmicort nebulized twice daily Discontinuing Brovana Starting Xopenex and Atrovent nebulized every 6 hours Monitoring with intermittent chest x-ray imaging Daily  pressure support wean/spontaneous breathing trial as mental status allows   CARDIOVASCULAR A:  Shock: Likely combination cardiogenic and sepsis. Atrial fibrillation with rapid ventricular response Acute decompensated diastolic congestive heart failure History of aortic stenosis, aortic regurgitation, and mitral regurgitation History of carotid artery stenosis History dyslipidemia  P:  Vitals per unit protocol Continuous telemetry monitoring Heparin drip per pharmacy protocol Further management per cardiology/primary service  RENAL A:   Acute renal failure: Stabilizing. Hypernatremia: Slowly improving. Hyperchloremia: Slowly improving. Hypokalemia: Resolved.  P:   Trending urine output with Foley catheter Continuing free water 250 mL via tube every 4 hours Trending electrolytes and renal function daily Replace electrolytes as indicated   GASTROINTESTINAL A:   Shock liver: Transaminases steadily improving. History of GERD  P:   Add on hepatic function panel pending Nothing by mouth  HEMATOLOGIC A:   Anemia: No signs of active bleeding. Hemoglobin stable. Leukocytosis: Unclear etiology. No significant change. Possibly secondary to steroids.  P:  Trending cell counts daily with CBC Transfuse for Hgb <7.0 or active bleeding Heparin drip per pharmacy protocol  INFECTIOUS A:   MSSA & Enterobacter Pneumonia Sepsis  P:   Antibiotics consolidated to Cipro on 8/12 Plan  to repeat culture for fever  ENDOCRINE A:   Hyperglycemia: History of "prediabetes". Glucose controlled.  P:   Continuing Accu-Cheks every 4 hours Sliding-scale insulin for resistant algorithm Checking Hgb A1c  NEUROLOGIC A:   Acute encephalopathy: Multifactorial from benzodiazepines, sepsis, and hypoxia. Possible slow improvement. Normal B12 & TSH. History of alcohol use: At risk for withdrawal possibly. Sedation on ventilator History of depression  P:   RASS Goal:  0 to -1 Started  Precedex 8/12 Weaning fentanyl drip Discontinued Klonopin on 8/12 Started high-dose IV thiamine on 8/12 for 3 days Versed IV when necessary sedation Propofol drip when necessary sedation  Prophylaxis:  Heparin drip per protocol & Protonix via tube QHS. Diet:  NPO. Continuing tube feedings. Code Status:  Partial Code per previous physician discussions.  Disposition:  Remains critically ill in the ICU. Family Discussion/Update: Wife and family members updated at bedside. Please refer to plan of care note.  Remainder of care as per primary service and other consultants.  DISCUSSION:  71 y.o. smoker, with PMH of HTN, PVD admitted on 8/2 with SOB and AF w RVR.  Intubated on 8/3 in setting of agitation, respiratory failure and bradycardia/hypotension on precedex.  He has acute pulmonary edema from diastolic heart failure and valvular heart disease (moderate AS/AI and mod to severe MR)  I have spent a total of 31 minutes of critical care time today caring for the patient and reviewing the patient's electronic medical record.   Sonia Baller Ashok Cordia, M.D. [facility] Pager:  [phone] After 3pm or if no response, call [phone] 07/24/2017, 9:14 AM

## 2017-07-24 NOTE — Progress Notes (Addendum)
Patient ID: Brandon [name], male   DOB: 31-Aug-1946, 71 y.o.   MRN: [id number]     Advanced Heart Failure Rounding Note   Subjective:    8/3 developed progressive respiratory distress => Bipap => intubation.  With sedation, became hypotensive and bradycardic, dopamine + phenylephrine started.  Developed AKI. Hyperkalemia treated with HCO3 gtt and Kayexalate.  Also with metabolic acidosis with elevated lactate (HCO3 gtt begun).  LFTs rose to the 1000s range.  Vancomycin/Zosyn initially started empirically (?PNA component) => stopped. Antibiotics restarted 8/8 => vanco/cefepime.   CXR 07/23/17 with bibasilar airspace disease, stable.   Failed vent wean again yesterday. Weaned for ~ 20 minutes this am.  Per wife responded to her twice yesterday with purposeful movement. BUN/Creatinine relatively stable. Sodium improved.   CVP 5 today on my check. Weight shows up 3 lbs.   Remains Afib/RVR. Rates ~ 110 this am.    ECHO (5/18): EF 55-60%, moderate AS, moderate AI, moderate MR.   TTE (8/18): EF 55-60%, moderate LVH, moderate AS mean 33, moderate AI, moderate-severe MR  Objective:   Weight Range: 240 lb 8.4 oz (109.1 kg) Body mass index is 30.88 kg/m.   Vital Signs:   Temp:  [98.1 F (36.7 C)-98.9 F (37.2 C)] 98.7 F (37.1 C) (08/13 0330) Pulse Rate:  [26-150] 59 (08/13 0830) Resp:  [0-40] 20 (08/13 0830) BP: (86-142)/(54-110) 106/59 (08/13 0830) SpO2:  [97 %-100 %] 98 % (08/13 0830) FiO2 (%):  [30 %] 30 % (08/13 0800) Weight:  [240 lb 8.4 oz (109.1 kg)] 240 lb 8.4 oz (109.1 kg) (08/13 0500) Last BM Date: 07/24/17  Weight change: Filed Weights   07/22/17 0500 07/23/17 0500 07/24/17 0500  Weight: 235 lb 7.2 oz (106.8 kg) 237 lb 3.4 oz (107.6 kg) 240 lb 8.4 oz (109.1 kg)    Intake/Output:   Intake/Output Summary (Last 24 hours) at 07/24/17 0905 Last data filed at 07/24/17 0800  Gross per 24 hour  Intake          3516.49 ml  Output             3415 ml  Net           101.49  ml      Physical Exam  CVP 5-6  General: Intubated. Sedated.  HEENT: ETT Neck: Supple. JVP difficult. Appears at least 7-8 cm Carotids 2+ bilat; no bruits. No thyromegaly or nodule noted. Cor: PMI nondisplaced. Irregular, tachycardic. No M/G/R noted Lungs: Coarse throughout.  Abdomen: Obese, soft, non-tender, non-distended, no HSM. No bruits or masses. +BS  Extremities: No cyanosis, clubbing, rash, R and LLE no edema.  Neuro: Intubated. Does not open eyes. Withdraws from pain stimulation to feet.  GU: foley  Telemetry   Personally reviewed, Afib 100-110s    Labs    CBC  Recent Labs  07/23/17 0410 07/24/17 0201  WBC 18.5* 20.3*  HGB 8.3* 8.2*  HCT 27.4* 26.9*  MCV 98.9 98.2  PLT 235 165   Basic Metabolic Panel  Recent Labs  07/23/17 0410 07/24/17 0201  NA 151* 147*  K 3.8 3.6  CL 117* 112*  CO2 25 23  GLUCOSE 126* 154*  BUN 103* 99*  CREATININE 1.74* 1.84*  CALCIUM 8.5* 8.6*  MG  --  2.8*   Liver Function Tests No results for input(s): AST, ALT, ALKPHOS, BILITOT, PROT, ALBUMIN in the last 72 hours. No results for input(s): LIPASE, AMYLASE in the last 72 hours. Cardiac Enzymes No results for input(s): CKTOTAL,  CKMB, CKMBINDEX, TROPONINI in the last 72 hours.  BNP: BNP (last 3 results)  Recent Labs  07/13/17 1341  BNP 408.4*    ProBNP (last 3 results) No results for input(s): PROBNP in the last 8760 hours.   D-Dimer No results for input(s): DDIMER in the last 72 hours. Hemoglobin A1C No results for input(s): HGBA1C in the last 72 hours. Fasting Lipid Panel No results for input(s): CHOL, HDL, LDLCALC, TRIG, CHOLHDL, LDLDIRECT in the last 72 hours. Thyroid Function Tests No results for input(s): TSH, T4TOTAL, T3FREE, THYROIDAB in the last 72 hours.  Invalid input(s): FREET3  Other results:   Imaging    No results found.   Medications:     Scheduled Medications: . arformoterol  15 mcg Nebulization BID  . budesonide (PULMICORT)  nebulizer solution  0.5 mg Nebulization BID  . chlorhexidine gluconate (MEDLINE KIT)  15 mL Mouth Rinse BID  . Chlorhexidine Gluconate Cloth  6 each Topical Daily  . docusate  100 mg Per Tube BID  . feeding supplement (PRO-STAT SUGAR FREE 64)  60 mL Per Tube 5 X Daily  . feeding supplement (VITAL HIGH PROTEIN)  1,000 mL Per Tube Q24H  . free water  250 mL Per Tube Q4H  . insulin aspart  0-20 Units Subcutaneous Q4H  . mouth rinse  15 mL Mouth Rinse 10 times per day  . pantoprazole sodium  40 mg Per Tube QHS  . polyethylene glycol  17 g Oral Daily  . sodium chloride flush  10-40 mL Intracatheter Q12H  . sodium chloride flush  3 mL Intravenous Q12H  . sodium chloride flush  3 mL Intravenous Q12H    Infusions: . sodium chloride    . sodium chloride    . sodium chloride 10 mL/hr at 07/24/17 0800  . amiodarone 60 mg/hr (07/24/17 0800)  . ciprofloxacin 400 mg (07/24/17 0542)  . dexmedetomidine (PRECEDEX) IV infusion 1 mcg/kg/hr (07/24/17 0844)  . diltiazem (CARDIZEM) infusion Stopped (07/20/17 0641)  . heparin 1,150 Units/hr (07/24/17 0800)  . norepinephrine (LEVOPHED) Adult infusion 4 mcg/min (07/24/17 0800)  . thiamine injection Stopped (07/24/17 0326)    PRN Medications: sodium chloride, sodium chloride, acetaminophen, fentaNYL, metoprolol tartrate, midazolam, nitroGLYCERIN, ondansetron (ZOFRAN) IV, senna, sodium chloride flush, sodium chloride flush, sodium chloride flush    Patient Profile   71 yo with valvular heart disease and smoking/suspected COPD, CVA with carotid stenting was admitted with dyspnea/respiratory distress.   Assessment/Plan   1. Acute hypoxemic respiratory failure: There has been a component of pulmonary edema but CVP ~ 5-6 now. - On Antibiotics (vanco/cefepime) for suspected PNA => trach aspirate with Enterobacter and Staph.   - He is on prednisone for ? component of COPD exacerbation.  - Failed vent wean again yesterday. Wife says he would not want trach.  Weaned for ~ 20 minutes this am before precedex needed to be started and required versed for agitation.  2. Acute on chronic diastolic CHF: Baseline has at least moderate valvular disease (moderate AS, moderate AI, moderate-severe MR).  Suspect diastolic CHF triggered by poorly tolerated paroxysmal atrial fibrillation in setting of valvular disease.  Admission thought to be triggered by diastolic CHF/pulmonary edema.   He developed AKI with lactic acidosis in the setting of hypotension with sedation.  CVP 5-6. Renal function mildly improve. BUN remains high.  - I/O positive ~ 100 cc yesterday with Lasix 80 mg IV x 1. Weight shows up 3 lbs.  - Left/right heart cath if recovers.  3.  Shock: Probably mixed cardiogenic/vasodilatory.  Developed in the setting of sedation with Bipap then intubation as well as bradycardia with sedation. Back on low dose norepi at 4.  4. AKI: With hyperkalemia (treated) in setting of shock. Suspect ATN.  Creatinine 1.9>1.7 >2.04 > 1.99 > 1.74-> 1.84 - BUN trending down.  Family and patient do now want even short term HD.  5. Elevated LFTs:  - Last LFTs 07/21/17. Had been trending down. Check today.  6. Atrial fibrillation: Paroxysmal. Back in atrial fibrillation with RVR today.   - Continue amiodarone gtt at 60 mg/hr.    - Continue heparin drip.   7. Anemia: Recent fall in hgb to 8s range but has been stable here.  FOBT negative 8/6.  Fe deficient, got feraheme.  No overt GI bleeding.  PPI - Will eventually need GI workup if recovers.  8. ID: Blx CX NGTD. Possible PNA.  On vancomycin/cefepime.  WBCs back up slightly.   9. COPD: Suspected.  Active smoker.  On prednisone to cover exacerbation.  10. Valvular heart disease: Echo this admission with moderate AS, moderate AI, moderate to severe MR.  May need reassessment with TEE.  May eventually need consideration of surgical valve repair/replacement with Maze given poorly tolerated afib. 11. Hypernatremia:  - 147 today. Has been  getting free water boluses.  12. Neuro: Altered mental status, does not respond to commands or open eyes.  ?Uremia + infection, ?component of ETOH withdrawal.  13. Limited Code.   - Failed wean again yesterday.  - Palliative care following.   Prognosis extremely guarded. On-going discussions with palliative. Began to wean today but then required resumption of sedation + versed.  Length of Stay: [address]  Annamaria Helling  07/24/2017, 9:05 AM  Advanced Heart Failure Team Pager [phone] (M-F; 7a - 4p)  Please contact [facility] for night-coverage after hours (4p -7a ) and weekends on [email or web address]  Patient seen with PA, agree with the above note.   He has converted to sinus bradycardia in 40s, can decrease amiodarone to 30 mg/hr.    CVP 5 => hold Lasix today.   May have followed some commands yesterday for wife, not following commands today.  Weaned for 20 minutes then got agitated.   PNA: Enterobacter and Staph on tracheal aspirate => Continue abx per CCM.   BUN remains high but overall stable.   Discussed with family, given some possible purposeful movement yesterday will continue full support today, reassess tomorrow and attempt vent wean again.  He would not want tracheostomy.   Loralie Champagne 07/24/2017 10:40 AM

## 2017-07-24 NOTE — Progress Notes (Signed)
07/24/2017 1020 RN noted pt. Back in NSR rate slow 45-60 bpm, frequent ectopy. Bp 102/50. Precedex decreased to 0.8.  Dr. Ashok Cordia on floor and made aware. Verbal order received to decrease Amiodarone gtt to 30 mg/hr. Orders enacted. Will continue to closely monitor patient.

## 2017-07-25 DIAGNOSIS — I481 Persistent atrial fibrillation: Secondary | ICD-10-CM

## 2017-07-25 DIAGNOSIS — R451 Restlessness and agitation: Secondary | ICD-10-CM

## 2017-07-25 LAB — CBC WITH DIFFERENTIAL/PLATELET
BASOS PCT: 0 %
Basophils Absolute: 0 10*3/uL (ref 0.0–0.1)
Eosinophils Absolute: 0.1 10*3/uL (ref 0.0–0.7)
Eosinophils Relative: 0 %
HEMATOCRIT: 27 % — AB (ref 39.0–52.0)
Hemoglobin: 8.2 g/dL — ABNORMAL LOW (ref 13.0–17.0)
LYMPHS ABS: 1.4 10*3/uL (ref 0.7–4.0)
Lymphocytes Relative: 6 %
MCH: 29.9 pg (ref 26.0–34.0)
MCHC: 30.4 g/dL (ref 30.0–36.0)
MCV: 98.5 fL (ref 78.0–100.0)
MONO ABS: 1.5 10*3/uL — AB (ref 0.1–1.0)
MONOS PCT: 6 %
NEUTROS ABS: 21.8 10*3/uL — AB (ref 1.7–7.7)
Neutrophils Relative %: 88 %
Platelets: 233 10*3/uL (ref 150–400)
RBC: 2.74 MIL/uL — ABNORMAL LOW (ref 4.22–5.81)
RDW: 18.5 % — AB (ref 11.5–15.5)
WBC: 24.9 10*3/uL — ABNORMAL HIGH (ref 4.0–10.5)

## 2017-07-25 LAB — GLUCOSE, CAPILLARY
GLUCOSE-CAPILLARY: 131 mg/dL — AB (ref 65–99)
GLUCOSE-CAPILLARY: 126 mg/dL — AB (ref 65–99)
GLUCOSE-CAPILLARY: 127 mg/dL — AB (ref 65–99)
GLUCOSE-CAPILLARY: 127 mg/dL — AB (ref 65–99)
Glucose-Capillary: 131 mg/dL — ABNORMAL HIGH (ref 65–99)
Glucose-Capillary: 151 mg/dL — ABNORMAL HIGH (ref 65–99)
Glucose-Capillary: 177 mg/dL — ABNORMAL HIGH (ref 65–99)
Glucose-Capillary: 98 mg/dL (ref 65–99)

## 2017-07-25 LAB — HEMOGLOBIN A1C
HEMOGLOBIN A1C: 5.8 % — AB (ref 4.8–5.6)
MEAN PLASMA GLUCOSE: 120 mg/dL

## 2017-07-25 LAB — RENAL FUNCTION PANEL
ALBUMIN: 2.3 g/dL — AB (ref 3.5–5.0)
Anion gap: 9 (ref 5–15)
BUN: 113 mg/dL — AB (ref 6–20)
CHLORIDE: 112 mmol/L — AB (ref 101–111)
CO2: 24 mmol/L (ref 22–32)
Calcium: 8.3 mg/dL — ABNORMAL LOW (ref 8.9–10.3)
Creatinine, Ser: 1.98 mg/dL — ABNORMAL HIGH (ref 0.61–1.24)
GFR calc Af Amer: 37 mL/min — ABNORMAL LOW (ref >60)
GFR calc non Af Amer: 32 mL/min — ABNORMAL LOW (ref >60)
GLUCOSE: 142 mg/dL — AB (ref 65–99)
PHOSPHORUS: 4.7 mg/dL — AB (ref 2.5–4.6)
POTASSIUM: 3.3 mmol/L — AB (ref 3.5–5.1)
Sodium: 145 mmol/L (ref 135–145)

## 2017-07-25 LAB — HEPARIN LEVEL (UNFRACTIONATED): Heparin Unfractionated: 0.26 IU/mL — ABNORMAL LOW (ref 0.30–0.70)

## 2017-07-25 LAB — MAGNESIUM: MAGNESIUM: 2.8 mg/dL — AB (ref 1.7–2.4)

## 2017-07-25 MED ORDER — FENTANYL CITRATE (PF) 100 MCG/2ML IJ SOLN
25.0000 ug | INTRAMUSCULAR | Status: DC | PRN
  Administered 2017-07-25: 50 ug via INTRAVENOUS
  Administered 2017-07-26 – 2017-07-27 (×4): 100 ug via INTRAVENOUS
  Filled 2017-07-25 (×5): qty 2

## 2017-07-25 MED ORDER — FENTANYL CITRATE (PF) 100 MCG/2ML IJ SOLN
100.0000 ug | Freq: Once | INTRAMUSCULAR | Status: AC
  Administered 2017-07-25: 100 ug via INTRAVENOUS
  Filled 2017-07-25: qty 2

## 2017-07-25 MED ORDER — GLYCOPYRROLATE 0.2 MG/ML IJ SOLN
0.4000 mg | Freq: Once | INTRAMUSCULAR | Status: AC
  Administered 2017-07-25: 0.4 mg via INTRAVENOUS
  Filled 2017-07-25: qty 2

## 2017-07-25 MED ORDER — MIDAZOLAM HCL 2 MG/2ML IJ SOLN
1.0000 mg | Freq: Once | INTRAMUSCULAR | Status: AC
  Administered 2017-07-25: 2 mg via INTRAVENOUS
  Filled 2017-07-25: qty 2

## 2017-07-25 MED ORDER — FUROSEMIDE 10 MG/ML IJ SOLN
40.0000 mg | Freq: Two times a day (BID) | INTRAMUSCULAR | Status: DC
  Administered 2017-07-25 – 2017-07-26 (×3): 40 mg via INTRAVENOUS
  Filled 2017-07-25 (×3): qty 4

## 2017-07-25 MED ORDER — PANTOPRAZOLE SODIUM 40 MG IV SOLR
40.0000 mg | Freq: Every day | INTRAVENOUS | Status: DC
  Administered 2017-07-25: 40 mg via INTRAVENOUS
  Filled 2017-07-25: qty 40

## 2017-07-25 MED ORDER — POTASSIUM CHLORIDE 20 MEQ/15ML (10%) PO SOLN
40.0000 meq | Freq: Once | ORAL | Status: AC
  Administered 2017-07-25: 40 meq via ORAL
  Filled 2017-07-25: qty 30

## 2017-07-25 MED ORDER — MIDAZOLAM HCL 2 MG/2ML IJ SOLN: 1.0000 mg | INTRAMUSCULAR | Status: DC | PRN

## 2017-07-25 MED ORDER — GLYCOPYRROLATE 0.2 MG/ML IJ SOLN
0.2000 mg | INTRAMUSCULAR | Status: DC | PRN
  Administered 2017-08-03: 0.2 mg via INTRAVENOUS
  Filled 2017-07-25: qty 1

## 2017-07-25 NOTE — Progress Notes (Signed)
Daily Progress Note   Patient Name: Brandon [name]       Date: 07/25/2017 DOB: 11-27-1946  Age: 71 y.o. MRN#: [id number] Attending Physician: Thayer Headings, MD Primary Care Physician: Maurice Small, MD Admit Date: 07/13/2017  Reason for Consultation/Follow-up: Establishing goals of care  Subjective: Brandon [name] was extubated today with family at bedside.    Length of Stay: 11  Current Medications: Scheduled Meds:  . chlorhexidine gluconate (MEDLINE KIT)  15 mL Mouth Rinse BID  . Chlorhexidine Gluconate Cloth  6 each Topical Daily  . furosemide  40 mg Intravenous BID  . insulin aspart  0-20 Units Subcutaneous Q4H  . mouth rinse  15 mL Mouth Rinse 10 times per day  . pantoprazole (PROTONIX) IV  40 mg Intravenous QHS  . sodium chloride flush  10-40 mL Intracatheter Q12H  . sodium chloride flush  3 mL Intravenous Q12H    Continuous Infusions: . sodium chloride    . sodium chloride 10 mL/hr at 07/24/17 1900  . amiodarone 30 mg/hr (07/25/17 0702)  . ciprofloxacin 400 mg (07/25/17 1610)  . dexmedetomidine (PRECEDEX) IV infusion 0.8 mcg/kg/hr (07/25/17 1552)  . heparin 1,200 Units/hr (07/25/17 1526)  . norepinephrine (LEVOPHED) Adult infusion 2 mcg/min (07/25/17 1345)  . thiamine injection 0 mg (07/25/17 0936)    PRN Meds: sodium chloride, fentaNYL (SUBLIMAZE) injection, glycopyrrolate, metoprolol tartrate, midazolam, nitroGLYCERIN, ondansetron (ZOFRAN) IV, sodium chloride flush, sodium chloride flush  Physical Exam         Constitutional: He appears well-developed. He has a sickly appearance. He is intubated.  HENT:  Head: Normocephalic and atraumatic.  Cardiovascular:  An irregularly irregular rhythm, bradycardia present.  Pulmonary/Chest: No tachypnea. He is intubated. No respiratory  distress.  Abdominal: Soft. Normal appearance.  Neurological:  Agitated/restless but unresponsive, not following any commands  Nursing note and vitals reviewed.  Vital Signs: BP (!) 94/46   Pulse (!) 49   Temp 98.3 F (36.8 C) (Oral)   Resp (!) 23   Ht '6\' 2"'  (1.88 m) Comment: measured  Wt 111.3 kg (245 lb 6.4 oz)   SpO2 98%   BMI 31.51 kg/m  SpO2: SpO2: 98 % O2 Device: O2 Device: Ventilator O2 Flow Rate: O2 Flow Rate (L/min): 40 L/min  Intake/output summary:   Intake/Output Summary (Last 24 hours) at 07/25/17 1658 Last  data filed at 07/25/17 1500  Gross per 24 hour  Intake          3304.91 ml  Output             2710 ml  Net           594.91 ml   LBM: Last BM Date: 07/25/17 (Flexiseal in place) Baseline Weight: Weight: 109.8 kg (242 lb) Most recent weight: Weight: 111.3 kg (245 lb 6.4 oz)       Palliative Assessment/Data: 20%      Patient Active Problem List   Diagnosis Date Noted  . Pressure injury of skin 07/20/2017  . Ventilator dependence ([place])   . Goals of care, counseling/discussion   . Palliative care encounter   . Acute respiratory failure with hypoxemia ([place])   . Acute pulmonary edema ([facility])   . Acute diastolic heart failure ([place])   . Hypotensive episode   . Arrhythmia 07/13/2017  . Aortic stenosis, moderate 07/13/2017  . Anemia 07/13/2017  . Dyspnea 07/13/2017  . Respiratory distress 07/13/2017  . S/P left rotator cuff repair 05/18/2017  . Non-rheumatic mitral regurgitation   . HOH (hard of hearing) 06/02/2013  . Obesity (BMI 30-39.9) 06/02/2013  . Acute cholecystitis with chronic cholecystitis 06/01/2013  . Calculus of bile duct without mention of cholecystitis or obstruction 05/31/2013  . Nonspecific elevation of levels of transaminase or lactic acid dehydrogenase (LDH) 05/31/2013  . Nonspecific (abnormal) findings on radiological and other examination of biliary tract 05/31/2013  . TOBACCO ABUSE 02/22/2010  . DYSLIPIDEMIA 06/25/2009  .  MITRAL INSUFFICIENCY 06/25/2009  . Essential hypertension 06/25/2009  . Aortic valve disorder 06/25/2009  . VALVULAR HEART DISEASE 06/25/2009  . Cardiovascular disease 06/25/2009  . CEREBROVASCULAR DISEASE 06/25/2009  . CEREBROVASCULAR ACCIDENT WITH RIGHT HEMIPARESIS 06/25/2009  . COPD exacerbation ([place]) 06/25/2009  . FOOT SURGERY, HX OF 06/25/2009  . POLYPECTOMY, HX OF 06/25/2009  . TONSILLECTOMY, HX OF 06/25/2009    Palliative Care Assessment & Plan   HPI: 71 y.o. male  with past medical history of CVA, HTN, HL, AS, MR, [name], current tobacco use (since 71 yo), remote hx colon polyps admitted on 07/13/2017 with chest pain, diaphoresis, elevated troponin. Hospital stay complicated by afib RVR and volume overload leading to BiPAP and ultimately ventilator 8/3. Has continued to have issues with afib/tachycardia and agitation and has not made much progress on ventilator. Family questioning if these aggressive measures are what he would want and expectations with continuing aggressive support.   Assessment: Long discussion with family at bedside. Katharine Look is tearful and feeling like she wishes for extubation. She says he was very agitated last night. We discussed the process of one way extubation and that expectations are somewhat unpredictable but we did discuss further decline and transition to comfort care at that time. We discussed gathering family for extubation and Katharine Look decides that she wishes to proceed with one way extubation today with priority focus comfort but also allowing him best opportunity to improve as possible.  We came back later to extubate as family arrived. Brandon [name] was successfully extubated after giving prn fentanyl and versed to assist with success and comfort. His vital signs are stable and maintaining breathes/sats on his own with nasal cannula with weak cough. Brandon [name] also is attempting to open eyes but will (inconsistently) nod head yes/no and did verbalize softly  "nah" and "yeah." Katharine Look and family happy with these responses. We discussed that the first 24 hours are very important. Emotional support  provided.   Late entry: Came back for needs/support and Brandon [name] continues being stable with stronger cough. Seems to be trying to respond a little more. Discussed plan with family and RN to begin titration of Precedex as he tolerates over night. Clarified with Katharine Look that goal is for comfort and especially with further decline. Goal is for him to not suffer. Family hopeful with these responses today but also aware he is still very tenuous. Emotional support provided.   Recommendations/Plan:  One way extubation completed.   Dyspnea/pain: Fentanyl 25-100 mcg every hour prn.   Agitation: Versed 1-2 mg every 4 hours prn.   Robinul 0.4 IV x 1. Robinul 0.2 IV every 4 hours prn.   Titrating down Precedex d/t continued sedation.   Code Status:  DNR  Prognosis:   Unable to determine  Discharge Planning:  To Be Determined  Care plan was discussed with Dr. Raynelle Dick NP, Corene Cornea RN  Thank you for allowing the Palliative Medicine Team to assist in the care of this patient.   Total Time 0830-0930 1300-1400 1600-1700 141mn Prolonged Time Billed  yes       Greater than 50%  of this time was spent counseling and coordinating care related to the above assessment and plan.  AVinie Sill NP Palliative Medicine Team Pager # [phone](M-F 8a-5p) Team Phone # [phone](Nights/Weekends)

## 2017-07-25 NOTE — Progress Notes (Addendum)
Patient ID: Brandon [name], male   DOB: Apr 13, 1946, 71 y.o.   MRN: [id number]     Advanced Heart Failure Rounding Note   Subjective:    8/3 developed progressive respiratory distress => Bipap => intubation.  With sedation, became hypotensive and bradycardic, dopamine + phenylephrine started.  Developed AKI. Hyperkalemia treated with HCO3 gtt and Kayexalate.  Also with metabolic acidosis with elevated lactate (HCO3 gtt begun).  LFTs rose to the 1000s range.  Vancomycin/Zosyn initially started empirically (?PNA component) => stopped. Antibiotics restarted 8/8 => vanco/cefepime.   CXR 07/23/17 with bibasilar airspace disease, stable.   Failed vent wean again yesterday and had worsening agitation. Placed in soft restraints. Not following commands.   CVP 8-9. Weight shows up another 5 lbs.    ECHO (5/18): EF 55-60%, moderate AS, moderate AI, moderate MR.   TTE (8/18): EF 55-60%, moderate LVH, moderate AS mean 33, moderate AI, moderate-severe MR  Objective:   Weight Range: 245 lb 6.4 oz (111.3 kg) Body mass index is 31.51 kg/m.   Vital Signs:   Temp:  [98.1 F (36.7 C)-99.9 F (37.7 C)] 98.9 F (37.2 C) (08/14 0826) Pulse Rate:  [35-101] 44 (08/14 0700) Resp:  [15-41] 20 (08/14 0700) BP: (88-179)/(44-77) 111/53 (08/14 0700) SpO2:  [92 %-100 %] 99 % (08/14 0735) FiO2 (%):  [30 %] 30 % (08/14 0735) Weight:  [245 lb 6.4 oz (111.3 kg)] 245 lb 6.4 oz (111.3 kg) (08/14 0300) Last BM Date: 07/24/17  Weight change: Filed Weights   07/23/17 0500 07/24/17 0500 07/25/17 0300  Weight: 237 lb 3.4 oz (107.6 kg) 240 lb 8.4 oz (109.1 kg) 245 lb 6.4 oz (111.3 kg)    Intake/Output:   Intake/Output Summary (Last 24 hours) at 07/25/17 0845 Last data filed at 07/25/17 0300  Gross per 24 hour  Intake          2678.18 ml  Output             1275 ml  Net          1403.18 ml      Physical Exam  CVP 8-9  General: Intubated. Sedated.  HEENT: +ETT Neck: Supple. JVP difficult to assess  due to body habitus. Carotids 2+ bilat; no bruits. No thyromegaly or nodule noted. Cor: PMI nondisplaced. RRR. Slightly brady.  Lungs: Coarse throughout.  Abdomen: Soft, non-tender, non-distended, no HSM. No bruits or masses. +BS  Extremities: No cyanosis, clubbing, rash, R and LLE no edema.  Neuro: Intubated. Does not open eyes or follow commands. Withdraws from pain stimulation to feet.   Telemetry   Personally reviewed, NSR - Sinus brady  Labs    CBC  Recent Labs  07/24/17 0201 07/25/17 0427  WBC 20.3* 24.9*  NEUTROABS  --  21.8*  HGB 8.2* 8.2*  HCT 26.9* 27.0*  MCV 98.2 98.5  PLT 256 270   Basic Metabolic Panel  Recent Labs  07/24/17 0201 07/25/17 0427  NA 147* 145  K 3.6 3.3*  CL 112* 112*  CO2 23 24  GLUCOSE 154* 142*  BUN 99* 113*  CREATININE 1.84* 1.98*  CALCIUM 8.6* 8.3*  MG 2.8* 2.8*  PHOS  --  4.7*   Liver Function Tests  Recent Labs  07/24/17 1530 07/25/17 0427  AST 55*  --   ALT 204*  --   ALKPHOS 41  --   BILITOT 0.8  --   PROT 5.5*  --   ALBUMIN 2.4* 2.3*   No results for input(s):  LIPASE, AMYLASE in the last 72 hours. Cardiac Enzymes No results for input(s): CKTOTAL, CKMB, CKMBINDEX, TROPONINI in the last 72 hours.  BNP: BNP (last 3 results)  Recent Labs  07/13/17 1341  BNP 408.4*    ProBNP (last 3 results) No results for input(s): PROBNP in the last 8760 hours.   D-Dimer No results for input(s): DDIMER in the last 72 hours. Hemoglobin A1C  Recent Labs  07/24/17 0201  HGBA1C 5.8*   Fasting Lipid Panel No results for input(s): CHOL, HDL, LDLCALC, TRIG, CHOLHDL, LDLDIRECT in the last 72 hours. Thyroid Function Tests No results for input(s): TSH, T4TOTAL, T3FREE, THYROIDAB in the last 72 hours.  Invalid input(s): FREET3  Other results:   Imaging    No results found.   Medications:     Scheduled Medications: . budesonide (PULMICORT) nebulizer solution  0.5 mg Nebulization BID  . chlorhexidine gluconate  (MEDLINE KIT)  15 mL Mouth Rinse BID  . Chlorhexidine Gluconate Cloth  6 each Topical Daily  . docusate  100 mg Per Tube BID  . feeding supplement (PRO-STAT SUGAR FREE 64)  60 mL Per Tube 5 X Daily  . feeding supplement (VITAL HIGH PROTEIN)  1,000 mL Per Tube Q24H  . free water  250 mL Per Tube Q4H  . insulin aspart  0-20 Units Subcutaneous Q4H  . ipratropium  0.5 mg Nebulization Q6H  . levalbuterol  0.63 mg Nebulization Q6H  . mouth rinse  15 mL Mouth Rinse 10 times per day  . pantoprazole sodium  40 mg Per Tube QHS  . sodium chloride flush  10-40 mL Intracatheter Q12H  . sodium chloride flush  3 mL Intravenous Q12H  . sodium chloride flush  3 mL Intravenous Q12H    Infusions: . sodium chloride    . sodium chloride    . sodium chloride 10 mL/hr at 07/24/17 1900  . amiodarone 30 mg/hr (07/25/17 0702)  . ciprofloxacin Stopped (07/25/17 0521)  . dexmedetomidine (PRECEDEX) IV infusion 0.8 mcg/kg/hr (07/25/17 0702)  . heparin 1,150 Units/hr (07/24/17 1900)  . norepinephrine (LEVOPHED) Adult infusion 4 mcg/min (07/24/17 1900)  . thiamine injection Stopped (07/25/17 0052)    PRN Medications: sodium chloride, sodium chloride, fentaNYL (SUBLIMAZE) injection, metoprolol tartrate, midazolam, nitroGLYCERIN, ondansetron (ZOFRAN) IV, senna, sodium chloride flush, sodium chloride flush, sodium chloride flush    Patient Profile   71 yo with valvular heart disease and smoking/suspected COPD, CVA with carotid stenting was admitted with dyspnea/respiratory distress.   Assessment/Plan   1. Acute hypoxemic respiratory failure: There has been a component of pulmonary edema. - CVP 8-9 this am.  - On Antibiotics (consolidated to Cipro) for suspected PNA => trach aspirate with Enterobacter and Staph.   - He is on prednisone for ? component of COPD exacerbation.  - Failed wean again yesterday with tachypnea and worsening agitation.   2. Acute on chronic diastolic CHF: Baseline has at least  moderate valvular disease (moderate AS, moderate AI, moderate-severe MR).  Suspect diastolic CHF triggered by poorly tolerated paroxysmal atrial fibrillation in setting of valvular disease.  Admission thought to be triggered by diastolic CHF/pulmonary edema.   He developed AKI with lactic acidosis in the setting of hypotension with sedation.  - CVP 8-9 - I/O + 1.2 L yesterday. Weight up 5 lbs.   - Left/right heart cath if recovers.  3. Shock: Probably mixed cardiogenic/vasodilatory.  Developed in the setting of sedation with Bipap then intubation as well as bradycardia with sedation.  4. AKI: With hyperkalemia (  treated) in setting of shock. Suspect ATN.  Creatinine 1.9>1.7 >2.04 > 1.99 > 1.74-> 1.84 -> 1.98 - BUN and creatinine worse again today. Family and patient do not want even short term HD.  5. Elevated LFTs:  - LFTs improved.   6. Atrial fibrillation: Paroxysmal. Back in atrial fibrillation with RVR today.   - Continue amiodarone gtt at 30 mg/hr.    - Continue heparin drip.   7. Anemia: Recent fall in hgb to 8s range but has been stable here.  FOBT negative 8/6.  Fe deficient, got feraheme.   - Hgb 8.2 this am. No overt GI bleeding. Continue to follow.  8. ID: Blx CX NGTD. Possible PNA.  On vancomycin/cefepime initially, now ciprofloxacin.   - WBCs up to 24.9.    9. COPD: Suspected.  Active smoker.  On prednisone to cover exacerbation.  10. Valvular heart disease: Echo this admission with moderate AS, moderate AI, moderate to severe MR.  May need reassessment with TEE.  May eventually need consideration of surgical valve repair/replacement with Maze given poorly tolerated afib. 11. Hypernatremia:  - 145 today. Has been getting free water boluses.  12. Neuro: Altered mental status, does not respond to commands or open eyes.  ?Uremia + infection, ?component of ETOH withdrawal. Relatively unchanged. Continued agitation with attempts to wean.  13. Limited Code.   - Continues to fail attempts  to wean and family adamant that patient would not want trach.   - Palliative care following.   Prognosis remains guarded. On-going discussions with palliative care team. Family has questions about comfort care today. Will inform involved teams so providers can come by and speak with her. Palliative care to see again this am.   Length of Stay: [place], [place]  07/25/2017, 8:45 AM  Advanced Heart Failure Team Pager [phone] (M-F; 7a - 4p)  Please contact [facility] for night-coverage after hours (4p -7a ) and weekends on [email or web address]  Patient seen with PA, agree with the above note.  He had one-way extubation this afternoon.  Patient tolerated well, oxygen saturation in 100s on room air.  Still not following commands but appears to be defending his airway.   CVP 8-9, BUN/creatinine mildly higher but basically stable.  No Lasix yesterday.  - Lasix 40 mg IV bid today.   He is in NSR currently on amiodarone gtt and heparin gtt.   WBCs remain high, no fever.  Abx consolidated to Cipro.   He is on norepinephrine 4, continue to titrate down as able.   Will follow over the next day to see if mental status improves.   Loralie Champagne 07/25/2017 1:27 PM

## 2017-07-25 NOTE — Progress Notes (Signed)
All family members at bedside awaiting extubation. Palliative care at bedside. Versed and Fentanyl IV given in preparation for extubation. Respiratory Therapist and RN at bedside.

## 2017-07-25 NOTE — Progress Notes (Signed)
ANTICOAGULATION CONSULT NOTE - Follow Up Consult  Pharmacy Consult for heparin Indication: atrial fibrillation  Labs:  Recent Labs  07/23/17 0410 07/24/17 0201 07/25/17 0427  HGB 8.3* 8.2*  --   HCT 27.4* 26.9*  --   PLT 235 256  --   HEPARINUNFRC 0.49 0.38 0.26*  CREATININE 1.74* 1.84* 1.98*    Assessment: 71yo male now subtherapeutic on heparin after rate change and one level at low end of goal.  Goal of Therapy:  Heparin level 0.3-0.5 units/ml   Plan:  Will increase heparin gtt very slightly to 1200 units/hr and check level in [place], PharmD, BCPS  07/25/2017,6:21 AM

## 2017-07-25 NOTE — Progress Notes (Signed)
PULMONARY / CRITICAL CARE MEDICINE   Name: Brandon [name] MRN: [id number] DOB: 1946/10/25    ADMISSION DATE:  07/13/2017 CONSULTATION DATE:  07/14/17 REFERRING MD:  Dr. Acie Fredrickson   CHIEF COMPLAINT:  Respiratory distress  BRIEF HPI:  71 y.o. heavy smoker admitted with acute hypoxic respiratory failure along with atrial fibrillation with rapid ventricular response. Patient was found to be in volume overload. With confusion occurring after admission and hypotension on Precedex in the setting of acute on chronic renal failure patient was intubated on 8/3.  SUBJECTIVE:  Patient still not following commands. Wife planning on one way extubation today.   REVIEW OF SYSTEMS:  Unable to obtain with intubation & altered mentation.   VITAL SIGNS: BP (!) 121/52   Pulse (!) 43   Temp (!) 97.5 F (36.4 C) (Oral)   Resp 20   Ht 6\' 2"  (1.88 m) Comment: measured  Wt 245 lb 6.4 oz (111.3 kg)   SpO2 100%   BMI 31.51 kg/m   HEMODYNAMICS: CVP:  [8 mmHg-11 mmHg] 8 mmHg  VENTILATOR SETTINGS: Vent Mode: PRVC FiO2 (%):  [30 %] 30 % Set Rate:  [20 bmp] 20 bmp Vt Set:  [428 mL] 660 mL PEEP:  [5 cmH20] 5 cmH20 Plateau Pressure:  [17 cmH20-21 cmH20] 17 cmH20  INTAKE / OUTPUT: I/O last 3 completed shifts: In: 5726.6 [I.V.:2936.6; NG/GT:1940; IV Piggyback:850] Out: 4090 [Urine:4090]   PHYSICAL EXAMINATION: General: Eyes closed. Still with multiple family members at bedside. No distress.  Integument: Warm. Dry. No rash on exposed skin. HEENT:  Endotracheal tube in place. No scleral icterus. No scleral injection. Cardiovascular:  Have sinus bradycardia on telemetry. No appreciable JVD. Pulmonary:  Somewhat improved aeration bilaterally. Symmetric chest wall rise on ventilator. Abdomen: Normal active bowel sounds. Protuberant. Soft. Neurological: Pupils pinpoint and symmetric. Intermittently moving all 4 extremities. Not following commands.  LABS:  BMET  Recent Labs Lab 07/23/17 0410  07/24/17 0201 07/25/17 0427  NA 151* 147* 145  K 3.8 3.6 3.3*  CL 117* 112* 112*  CO2 25 23 24   BUN 103* 99* 113*  CREATININE 1.74* 1.84* 1.98*  GLUCOSE 126* 154* 142*    Electrolytes  Recent Labs Lab 07/19/17 0332  07/23/17 0410 07/24/17 0201 07/25/17 0427  CALCIUM 9.0  < > 8.5* 8.6* 8.3*  MG 2.5*  --   --  2.8* 2.8*  PHOS 5.2*  --   --   --  4.7*  < > = values in this interval not displayed.  CBC  Recent Labs Lab 07/23/17 0410 07/24/17 0201 07/25/17 0427  WBC 18.5* 20.3* 24.9*  HGB 8.3* 8.2* 8.2*  HCT 27.4* 26.9* 27.0*  PLT 235 256 233    Coag's No results for input(s): APTT, INR in the last 168 hours.  Sepsis Markers No results for input(s): LATICACIDVEN, PROCALCITON, O2SATVEN in the last 168 hours.  ABG No results for input(s): PHART, PCO2ART, PO2ART in the last 168 hours.  Liver Enzymes  Recent Labs Lab 07/20/17 0331 07/21/17 0109 07/24/17 1530 07/25/17 0427  AST 103* 86* 55*  --   ALT 864* 621* 204*  --   ALKPHOS 64 57 41  --   BILITOT 1.0 1.0 0.8  --   ALBUMIN 3.1* 2.9* 2.4* 2.3*    Cardiac Enzymes No results for input(s): TROPONINI, PROBNP in the last 168 hours.  Glucose  Recent Labs Lab 07/24/17 1725 07/24/17 2034 07/24/17 2157 07/24/17 2356 07/25/17 0407 07/25/17 0816  GLUCAP 134* 177* 163* 151* 131* 126*  Imaging No results found.  IMAGING/STUDIES: TTE 8/3:  LV moderately dilated with moderate concentric hypertrophy. EF 55-60% with akinesis of basal inferior myocardium and grade 2 diastolic dysfunction. LA moderately dilated & RA normal in size. RV normal in size and function. Moderate aortic regurgitation and moderate stenosis. Aortic root normal in size. Moderate to severe mitral regurgitation without stenosis. Trivial pulmonic regurgitation without stenosis. No tricuspid regurgitation. No pericardial effusion.  BILAT LE VENOUS DUPLEX 8/4:  Negative for DVT or SVT. LUE VENOUS DUPLEX 8/7:  SVT of basilic vein.  CT HEAD  W/O 8/9: IMPRESSION: 1. No acute intracranial process. 2. Large remote right MCA territory infarct. 3. Underlying generalized age-related cerebral atrophy. 4. Chronic left maxillary sinusitis. PORT CXR 8/11:  Previously reviewed by me. Right internal jugular central venous catheter in position. Endotracheal tube in good position. Enteric feeding tube course will diaphragm. Bilateral lower lung opacities consistent with pleural effusions vs consolidation present. PORT CXR 8/12:  Previously reviewed by me. Endotracheal tube in good position. Enteric feeding tube coursing below the diaphragm. Right internal jugular central venous catheter in good position. Bilateral lower lung opacities persist and are unchanged.  MICROBIOLOGY: MRSA PCR 8/2:  Negative  Blood Cultures x2 8/3:  Negative  Tracheal Aspirate Culture 8/8:  MSSA & Enterobacter   ANTIBIOTICS: Zosyn 8/3 - 8/6 Cefepime 8/9 - 8/12 Vancomycin 8/3 - 8/5; 8/10 - 8/12 Cipro 8/12 >>>  SIGNIFICANT EVENTS: 08/02 - Admit in A fib w/ RVR and volume overload 08/03 - PCCM consulted for respiratory distress & patient intubated 08/09 - Improving rate control but worsening renal function w/ diuresis 08/11 - Increasing movements but still not responding appropriately. Off diuretics. 08/12 - Antibiotics narrowed to Cipro given culture results & Klonopin stopped. Precedex started. 08/13 - Weaning on PS w/ tachypnea to 40s   LINES/TUBES: OETT 8/3 >>> R IJ CVL 8/3 >>> Foley >>> OGT >>> RECTAL TUBE >>> PIV  ASSESSMENT / PLAN:  PULMONARY A: Acute hypoxic respiratory failure Tobacco use disorder Probable COPD with acute exacerbation Bilateral pleural effusions History of OSA: Not currently on CPAP.  P:   Planning for one way extubation today Plan for supplemental oxygen Plan for morphine to alleviate any dyspnea postextubation  CARDIOVASCULAR A:  Shock: Likely combination cardiogenic and sepsis. Atrial fibrillation with rapid  ventricular response Acute decompensated diastolic congestive heart failure History of aortic stenosis, aortic regurgitation, and mitral regurgitation History of carotid artery stenosis History dyslipidemia  P:  Continuing monitoring per unit protocol Continuous telemetry monitoring Remainder of care as per primary service  RENAL A:   Acute renal failure: Stabilizing. Hypernatremia: Resolved. Hyperchloremia: Stable. Hypokalemia: Replaced.  P:   Plan leave Foley catheter in place for patient comfort  GASTROINTESTINAL A:   Shock liver: Transaminases steadily improving. History of GERD  P:   Add on hepatic function panel pending Nothing by mouth  HEMATOLOGIC A:   Anemia: Hemoglobin remains stable. No evidence of active bleeding. Leukocytosis: Continuing to worsen. Unclear etiology.  P:  Trending cell counts daily with CBC Transfuse for Hgb <7.0 or active bleeding Heparin drip per pharmacy protocol  INFECTIOUS A:   MSSA & Enterobacter Pneumonia Sepsis  P:   Antibiotics consolidated to Cipro on 8/12  ENDOCRINE A:   Hyperglycemia: History of "prediabetes". Glucose controlled.  P:   Continuing Accu-Cheks every 4 hours Sliding-scale insulin for resistant algorithm Checking Hgb A1c  NEUROLOGIC A:   Acute encephalopathy: Multifactorial from benzodiazepines, sepsis, and hypoxia. Possible slow improvement. Normal B12 &  TSH. History of alcohol use: At risk for withdrawal possibly. Sedation on ventilator History of depression  P:   RASS Goal:  0 to -1 Continuing Precedex drip Continuing high-dose IV thiamine Morphine and other palliative medications as per palliative medicine service postextubation  Prophylaxis:  Heparin drip per protocol & Protonix via tube QHS. Diet:  NPO. Holding tube feedings in anticipation of extubation. Code Status:  Partial Code per previous physician discussions.  Disposition:  Remains critically ill in the ICU. Plan for one way  extubation today per discussion with wife. Family Discussion/Update: Wife updated at bedside at the time of my exam.  Remainder of care as per primary service and other consultants.  DISCUSSION:  71 y.o.  male with ongoing encephalopathy. With patient's multiple medical problems the patient's wife feels that he would not wish to be maintained on mechanical ventilation and artificial support. As such, she has decided for a one way extubation today. She will notify nursing staff and palliative medicine service when they are ready.  I have spent a total of 34 minutes of critical care time today caring for the patient, discussing plan of care with palliative medicine and wife at bedside, and reviewing the patient's electronic medical record.   Sonia Baller Ashok Cordia, M.D. [facility] Pulmonary & Critical Care Pager:  [phone] After 3pm or if no response, call [phone] 07/25/2017, 11:43 AM

## 2017-07-25 NOTE — Procedures (Signed)
Extubation Procedure Note  Patient Details:   Name: Brandon [name] DOB: June 18, 1946 MRN: [id number]   Airway Documentation:     Evaluation  O2 sats: transiently fell during during procedure Complications: No apparent complications Patient did not tolerate procedure well. Bilateral Breath Sounds: Clear, Diminished   No  PT was a terminal extubation  PT was extubated to a 3L [place]  Earnest Thalman, Leonie Douglas 07/25/2017, 1:13 PM

## 2017-07-26 ENCOUNTER — Inpatient Hospital Stay (HOSPITAL_COMMUNITY): Payer: Medicare HMO

## 2017-07-26 LAB — RENAL FUNCTION PANEL
Albumin: 2.3 g/dL — ABNORMAL LOW (ref 3.5–5.0)
Anion gap: 10 (ref 5–15)
BUN: 93 mg/dL — AB (ref 6–20)
CHLORIDE: 114 mmol/L — AB (ref 101–111)
CO2: 24 mmol/L (ref 22–32)
Calcium: 8.5 mg/dL — ABNORMAL LOW (ref 8.9–10.3)
Creatinine, Ser: 1.79 mg/dL — ABNORMAL HIGH (ref 0.61–1.24)
GFR calc Af Amer: 42 mL/min — ABNORMAL LOW (ref >60)
GFR calc non Af Amer: 36 mL/min — ABNORMAL LOW (ref >60)
GLUCOSE: 118 mg/dL — AB (ref 65–99)
POTASSIUM: 3.2 mmol/L — AB (ref 3.5–5.1)
Phosphorus: 4.1 mg/dL (ref 2.5–4.6)
Sodium: 148 mmol/L — ABNORMAL HIGH (ref 135–145)

## 2017-07-26 LAB — HEPARIN LEVEL (UNFRACTIONATED)
HEPARIN UNFRACTIONATED: 0.26 [IU]/mL — AB (ref 0.30–0.70)
HEPARIN UNFRACTIONATED: 0.24 [IU]/mL — AB (ref 0.30–0.70)
HEPARIN UNFRACTIONATED: 0.32 [IU]/mL (ref 0.30–0.70)

## 2017-07-26 LAB — GLUCOSE, CAPILLARY
GLUCOSE-CAPILLARY: 107 mg/dL — AB (ref 65–99)
GLUCOSE-CAPILLARY: 109 mg/dL — AB (ref 65–99)
GLUCOSE-CAPILLARY: 110 mg/dL — AB (ref 65–99)
GLUCOSE-CAPILLARY: 94 mg/dL (ref 65–99)
Glucose-Capillary: 114 mg/dL — ABNORMAL HIGH (ref 65–99)
Glucose-Capillary: 128 mg/dL — ABNORMAL HIGH (ref 65–99)

## 2017-07-26 LAB — MAGNESIUM
MAGNESIUM: 2.4 mg/dL (ref 1.7–2.4)
Magnesium: 2.6 mg/dL — ABNORMAL HIGH (ref 1.7–2.4)

## 2017-07-26 LAB — CBC WITH DIFFERENTIAL/PLATELET
Basophils Absolute: 0 10*3/uL (ref 0.0–0.1)
Basophils Relative: 0 %
EOS PCT: 1 %
Eosinophils Absolute: 0.2 10*3/uL (ref 0.0–0.7)
HCT: 26.8 % — ABNORMAL LOW (ref 39.0–52.0)
Hemoglobin: 8 g/dL — ABNORMAL LOW (ref 13.0–17.0)
LYMPHS ABS: 1 10*3/uL (ref 0.7–4.0)
LYMPHS PCT: 5 %
MCH: 30 pg (ref 26.0–34.0)
MCHC: 29.9 g/dL — AB (ref 30.0–36.0)
MCV: 100.4 fL — AB (ref 78.0–100.0)
MONO ABS: 1.5 10*3/uL — AB (ref 0.1–1.0)
MONOS PCT: 7 %
Neutro Abs: 18.6 10*3/uL — ABNORMAL HIGH (ref 1.7–7.7)
Neutrophils Relative %: 87 %
PLATELETS: 211 10*3/uL (ref 150–400)
RBC: 2.67 MIL/uL — ABNORMAL LOW (ref 4.22–5.81)
RDW: 18.3 % — AB (ref 11.5–15.5)
WBC: 20.8 10*3/uL — ABNORMAL HIGH (ref 4.0–10.5)

## 2017-07-26 LAB — PHOSPHORUS: Phosphorus: 3.7 mg/dL (ref 2.5–4.6)

## 2017-07-26 MED ORDER — PANTOPRAZOLE SODIUM 40 MG PO PACK
20.0000 mg | PACK | Freq: Every day | ORAL | Status: DC
  Administered 2017-07-26 – 2017-07-28 (×3): 20 mg
  Filled 2017-07-26 (×4): qty 20

## 2017-07-26 MED ORDER — PRO-STAT SUGAR FREE PO LIQD
30.0000 mL | Freq: Two times a day (BID) | ORAL | Status: DC
  Administered 2017-07-26: 30 mL
  Filled 2017-07-26: qty 30

## 2017-07-26 MED ORDER — POTASSIUM CHLORIDE 10 MEQ/50ML IV SOLN
10.0000 meq | INTRAVENOUS | Status: AC
Start: 2017-07-26 — End: 2017-07-26
  Administered 2017-07-26 (×3): 10 meq via INTRAVENOUS
  Filled 2017-07-26 (×3): qty 50

## 2017-07-26 MED ORDER — AMIODARONE HCL 200 MG PO TABS
200.0000 mg | ORAL_TABLET | Freq: Two times a day (BID) | ORAL | Status: DC
  Administered 2017-07-26 (×2): 200 mg via ORAL
  Filled 2017-07-26 (×2): qty 1

## 2017-07-26 MED ORDER — FREE WATER: 250.0000 mL | Status: DC

## 2017-07-26 MED ORDER — VITAL HIGH PROTEIN PO LIQD
1000.0000 mL | ORAL | Status: DC
  Administered 2017-07-26: 1000 mL
  Administered 2017-07-27 (×2)

## 2017-07-26 MED ORDER — HEPARIN (PORCINE) IN NACL 100-0.45 UNIT/ML-% IJ SOLN
1600.0000 [IU]/h | INTRAMUSCULAR | Status: DC
  Administered 2017-07-27 – 2017-07-31 (×4): 1500 [IU]/h via INTRAVENOUS
  Administered 2017-08-01 (×3): 1600 [IU]/h via INTRAVENOUS
  Filled 2017-07-26 (×13): qty 250

## 2017-07-26 MED ORDER — AMIODARONE HCL 200 MG PO TABS: 200.0000 mg | ORAL_TABLET | Freq: Every day | ORAL | Status: DC

## 2017-07-26 MED ORDER — FREE WATER
250.0000 mL | Freq: Four times a day (QID) | Status: DC
  Administered 2017-07-26 – 2017-07-29 (×12): 250 mL

## 2017-07-26 MED ORDER — POTASSIUM CHLORIDE 20 MEQ/15ML (10%) PO SOLN
40.0000 meq | Freq: Two times a day (BID) | ORAL | Status: AC
  Administered 2017-07-26: 40 meq via ORAL
  Filled 2017-07-26: qty 30

## 2017-07-26 NOTE — Progress Notes (Signed)
PULMONARY / CRITICAL CARE MEDICINE   Name: Brandon [name] MRN: [id number] DOB: Jan 21, 1946    ADMISSION DATE:  07/13/2017 CONSULTATION DATE:  07/14/17 REFERRING MD:  Dr. Acie Fredrickson   CHIEF COMPLAINT:  Respiratory distress  BRIEF HPI:  71 y.o. heavy smoker admitted with acute hypoxic respiratory failure along with atrial fibrillation with rapid ventricular response. Patient was found to be in volume overload. With confusion occurring after admission and hypotension on Precedex in the setting of acute on chronic renal failure patient was intubated on 8/3.  SUBJECTIVE:  Patient signifying some mild difficulty breathing as well as pain. Unable to further characterize. Successfully extubated yesterday.  REVIEW OF SYSTEMS:  Unable to obtain with continued altered mentation & difficulty hearing.  VITAL SIGNS: BP (!) 119/48   Pulse (!) 59   Temp 98.3 F (36.8 C) (Oral)   Resp (!) 23   Ht 6\' 2"  (1.88 m)   Wt 241 lb 10 oz (109.6 kg)   SpO2 99%   BMI 31.02 kg/m   HEMODYNAMICS: CVP:  [9 mmHg-10 mmHg] 10 mmHg  VENTILATOR SETTINGS: Vent Mode: PRVC FiO2 (%):  [30 %] 30 % Set Rate:  [20 bmp] 20 bmp Vt Set:  [789 mL] 660 mL PEEP:  [5 cmH20] 5 cmH20 Plateau Pressure:  [21 cmH20] 21 cmH20  INTAKE / OUTPUT: I/O last 3 completed shifts: In: 4184 [I.V.:2319; NG/GT:1065; IV Piggyback:800] Out: 3810 [Urine:4155]   PHYSICAL EXAMINATION: General: Eyes open. Feeding tube placed at bedside. Wife at bedside. Integument: Warm. Dry. No rash. HEENT:  No scleral icterus. Tachycardia mucous membranes. Poor dentition. Cardiovascular:  Regular rate. Unable to appreciate JVD with body habitus Pulmonary:  Mildly increased work of breathing on nasal cannula oxygen. Coarse breath sounds bilaterally. Abdomen: Protuberant. Soft. Normal bowel sounds. Neurological: Moving all 4 extremities equally. Attends to voice. Hard of hearing.  LABS:  BMET  Recent Labs Lab 07/24/17 0201 07/25/17 0427 07/26/17 0500   NA 147* 145 148*  K 3.6 3.3* 3.2*  CL 112* 112* 114*  CO2 23 24 24   BUN 99* 113* 93*  CREATININE 1.84* 1.98* 1.79*  GLUCOSE 154* 142* 118*    Electrolytes  Recent Labs Lab 07/24/17 0201 07/25/17 0427 07/26/17 0500  CALCIUM 8.6* 8.3* 8.5*  MG 2.8* 2.8* 2.6*  PHOS  --  4.7* 4.1    CBC  Recent Labs Lab 07/24/17 0201 07/25/17 0427 07/26/17 0500  WBC 20.3* 24.9* 20.8*  HGB 8.2* 8.2* 8.0*  HCT 26.9* 27.0* 26.8*  PLT 256 233 211    Coag's No results for input(s): APTT, INR in the last 168 hours.  Sepsis Markers No results for input(s): LATICACIDVEN, PROCALCITON, O2SATVEN in the last 168 hours.  ABG No results for input(s): PHART, PCO2ART, PO2ART in the last 168 hours.  Liver Enzymes  Recent Labs Lab 07/20/17 0331 07/21/17 0109 07/24/17 1530 07/25/17 0427 07/26/17 0500  AST 103* 86* 55*  --   --   ALT 864* 621* 204*  --   --   ALKPHOS 64 57 41  --   --   BILITOT 1.0 1.0 0.8  --   --   ALBUMIN 3.1* 2.9* 2.4* 2.3* 2.3*    Cardiac Enzymes No results for input(s): TROPONINI, PROBNP in the last 168 hours.  Glucose  Recent Labs Lab 07/25/17 1136 07/25/17 1536 07/25/17 1928 07/25/17 2338 07/26/17 0345 07/26/17 0849  GLUCAP 127* 127* 131* 98 109* 114*    Imaging No results found.  IMAGING/STUDIES: TTE 8/3:  LV moderately  dilated with moderate concentric hypertrophy. EF 55-60% with akinesis of basal inferior myocardium and grade 2 diastolic dysfunction. LA moderately dilated & RA normal in size. RV normal in size and function. Moderate aortic regurgitation and moderate stenosis. Aortic root normal in size. Moderate to severe mitral regurgitation without stenosis. Trivial pulmonic regurgitation without stenosis. No tricuspid regurgitation. No pericardial effusion.  BILAT LE VENOUS DUPLEX 8/4:  Negative for DVT or SVT. LUE VENOUS DUPLEX 8/7:  SVT of basilic vein.  CT HEAD W/O 8/9: IMPRESSION: 1. No acute intracranial process. 2. Large remote right  MCA territory infarct. 3. Underlying generalized age-related cerebral atrophy. 4. Chronic left maxillary sinusitis. PORT CXR 8/11:  Previously reviewed by me. Right internal jugular central venous catheter in position. Endotracheal tube in good position. Enteric feeding tube course will diaphragm. Bilateral lower lung opacities consistent with pleural effusions vs consolidation present. PORT CXR 8/12:  Previously reviewed by me. Endotracheal tube in good position. Enteric feeding tube coursing below the diaphragm. Right internal jugular central venous catheter in good position. Bilateral lower lung opacities persist and are unchanged.  MICROBIOLOGY: MRSA PCR 8/2:  Negative  Blood Cultures x2 8/3:  Negative  Tracheal Aspirate Culture 8/8:  MSSA & Enterobacter   ANTIBIOTICS: Zosyn 8/3 - 8/6 Cefepime 8/9 - 8/12 Vancomycin 8/3 - 8/5; 8/10 - 8/12 Cipro 8/12 >>>  SIGNIFICANT EVENTS: 08/02 - Admit in A fib w/ RVR and volume overload 08/03 - PCCM consulted for respiratory distress & patient intubated 08/09 - Improving rate control but worsening renal function w/ diuresis 08/11 - Increasing movements but still not responding appropriately. Off diuretics. 08/12 - Antibiotics narrowed to Cipro given culture results & Klonopin stopped. Precedex started. 08/13 - Weaning on PS w/ tachypnea to 40s 08/14 - One-way extubation    LINES/TUBES: OETT 8/3 - 8/14 OGT 8/3 - 8/14 RECTAL TUBE (out) R IJ CVL 8/3 >>> Foley >>> PIV  ASSESSMENT / PLAN:  PULMONARY A: Acute hypoxic respiratory failure Tobacco use disorder Probable COPD with acute exacerbation Bilateral pleural effusions History of OSA: Not currently on CPAP.  P:   Continuing to wean FiO2 Incentive spirometry for pulmonary toilette Recommend getting patient out of bed to chair as soon as possible  CARDIOVASCULAR A:  Shock: Likely combination cardiogenic and sepsis. Atrial fibrillation with rapid ventricular response Acute  decompensated diastolic congestive heart failure History of aortic stenosis, aortic regurgitation, and mitral regurgitation History of carotid artery stenosis History dyslipidemia  P:  Continuing monitoring per unit protocol Continuous telemetry monitoring Amiodarone drip Weaning vasopressor support Remainder of care as per primary service  RENAL A:   Acute renal failure: Stable.  Hypernatremia: Resolved. Hyperchloremia: Stable. Hypokalemia: Replaced.  P:   KCl 10 mEq IV 4 runs Trending electrolytes daily Monitoring urine output with Foley  GASTROINTESTINAL A:   Shock liver: Continuing to improve. History of GERD  P:   Checking portable abdominal x-ray to confirm enteric feeding tube placement Switching to Protonix via tube Trending hepatic function panel intermittently  HEMATOLOGIC A:   Anemia: Hemoglobin remains stable. No evidence of active bleeding. Leukocytosis: Improving. Likely secondary to sepsis.  P:  Trending cell counts daily with CBC Transfuse for Hgb <7.0 or active bleeding Heparin drip per pharmacy protocol  INFECTIOUS A:   MSSA & Enterobacter Pneumonia Sepsis  P:   Continuing Cipro day #4/10 May be able to discontinue antibiotic regimen 48 hours after leukocytosis normalizes Plan to repeat culture for fever  ENDOCRINE A:   Hyperglycemia: History of "prediabetes".  Glucose controlled.  P:   Continuing Accu-Cheks every 4 hours Sliding-scale insulin for resistant algorithm Checking Hgb A1c  NEUROLOGIC A:   Acute encephalopathy: Multifactorial from benzodiazepines, sepsis, and hypoxia. Possible slow improvement. Normal B12 & TSH. History of alcohol use: At risk for withdrawal possibly. Sedation on ventilator History of depression  P:   RASS Goal:  0\ Weaning Precedex drip Continuing high-dose IV thiamine Other medications as per palliative medicine service  Prophylaxis:  Heparin drip per protocol & Protonix via tube QHS. Diet:   NPO. Holding tube feedings in anticipation of extubation. Code Status:  Partial Code per previous physician discussions.  Disposition:  Prognosis remains guarded. Continuing close ICU monitoring wall weaning vasopressor and Precedex infusions. Family Discussion/Update: Wife updated at the time of my exam.  Remainder of care as per primary service and other consultants.  DISCUSSION:  71 y.o. male with improving encephalopathy. Continuing to wean vasopressor support. Continuing close monitoring of respiratory status. Hopefully we'll be able to transition off of Precedex infusion today.   I have spent a total of 33 minutes of critical care time today caring for the patient, updating wife at bedside, and reviewing the patient's electronic medical record.   Sonia Baller Ashok Cordia, M.D. [facility] Pager:  [phone] After 3pm or if no response, call [phone] 07/26/2017, 9:15 AM

## 2017-07-26 NOTE — Progress Notes (Signed)
ANTICOAGULATION CONSULT NOTE - Follow Up Consult  Pharmacy Consult for Heparin  Indication: atrial fibrillation  Allergies  Allergen Reactions  . Bee Venom Anaphylaxis and Swelling    Patient Measurements: Height: 6\' 2"  (188 cm) Weight: 241 lb 10 oz (109.6 kg) IBW/kg (Calculated) : 82.2   Vital Signs: Temp: 97.4 F (36.3 C) (08/15 1941) Temp Source: Oral (08/15 1941) BP: 145/60 (08/15 1900) Pulse Rate: 95 (08/15 1900)  Labs:  Recent Labs  07/24/17 0201 07/25/17 0427 07/26/17 0500 07/26/17 1305 07/26/17 1920  HGB 8.2* 8.2* 8.0*  --   --   HCT 26.9* 27.0* 26.8*  --   --   PLT 256 233 211  --   --   HEPARINUNFRC 0.38 0.26* 0.26* 0.24* 0.32  CREATININE 1.84* 1.98* 1.79*  --   --     Estimated Creatinine Clearance: 49.9 mL/min (A) (by C-G formula based on SCr of 1.79 mg/dL (H)).   Assessment: 71 y/o M continues on heparin drip for afib, heparin level now therapeutic at 0.32  Goal of Therapy:  Heparin level 0.3-0.7 units/ml Monitor platelets by anticoagulation protocol: Yes   Plan:  Continue heparin at 1500 units / hr Follow up AM heparin level, CBC  Thank you Anette Guarneri, PharmD [phone]  07/26/2017,8:30 PM

## 2017-07-26 NOTE — Progress Notes (Signed)
Patient ID: Brandon [name], male   DOB: Feb 02, 1946, 71 y.o.   MRN: [id number]     Advanced Heart Failure Rounding Note   Subjective:    8/3 developed progressive respiratory distress => Bipap => intubation.  With sedation, became hypotensive and bradycardic, dopamine + phenylephrine started.  Developed AKI. Hyperkalemia treated with HCO3 gtt and Kayexalate.  Also with metabolic acidosis with elevated lactate (HCO3 gtt begun).  LFTs rose to the 1000s range.  Vancomycin/Zosyn initially started empirically (?PNA component) => stopped. Antibiotics restarted 8/8 => vanco/cefepime.   CXR 07/23/17 with bibasilar airspace disease, stable.   Had one way extubation yesterday. Has done very well. Much more alert. On 02 via [place] at 4 L. Cor-Track being placed currently.   CVP 9-10.  Weight down 4 lbs.     ECHO (5/18): EF 55-60%, moderate AS, moderate AI, moderate MR.   TTE (8/18): EF 55-60%, moderate LVH, moderate AS mean 33, moderate AI, moderate-severe MR  Objective:   Weight Range: 241 lb 10 oz (109.6 kg) Body mass index is 31.02 kg/m.   Vital Signs:   Temp:  [97.5 F (36.4 C)-98.5 F (36.9 C)] 98.3 F (36.8 C) (08/15 0905) Pulse Rate:  [42-79] 59 (08/15 0500) Resp:  [15-31] 23 (08/15 0500) BP: (94-142)/(46-118) 119/48 (08/15 0600) SpO2:  [94 %-100 %] 99 % (08/15 0500) FiO2 (%):  [30 %] 30 % (08/14 1146) Weight:  [241 lb 10 oz (109.6 kg)] 241 lb 10 oz (109.6 kg) (08/15 0343) Last BM Date: 07/25/17  Weight change: Filed Weights   07/24/17 0500 07/25/17 0300 07/26/17 0343  Weight: 240 lb 8.4 oz (109.1 kg) 245 lb 6.4 oz (111.3 kg) 241 lb 10 oz (109.6 kg)   Intake/Output:   Intake/Output Summary (Last 24 hours) at 07/26/17 0917 Last data filed at 07/26/17 0900  Gross per 24 hour  Intake          1895.96 ml  Output             3210 ml  Net         -1314.04 ml    Physical Exam   CVP 9-10  General: Chronically ill appearing. NAD. appearing. No resp difficulty. HEENT:  Normal Neck: Supple. JVP 9-10 cm. Carotids 2+ bilat; no bruits. No thyromegaly or nodule noted. Cor: PMI nondisplaced. RRR, No M/G/R noted Lungs: Coarse.  Abdomen: Soft, non-tender, non-distended, no HSM. No bruits or masses. +BS  Extremities: No cyanosis, clubbing, rash, R and LLE no edema.  Neuro: Alert to person. Follows commands.   Telemetry   Personally reviewed, NSR - Sinus Brady in 60s   Labs    CBC  Recent Labs  07/25/17 0427 07/26/17 0500  WBC 24.9* 20.8*  NEUTROABS 21.8* 18.6*  HGB 8.2* 8.0*  HCT 27.0* 26.8*  MCV 98.5 100.4*  PLT 233 716   Basic Metabolic Panel  Recent Labs  07/25/17 0427 07/26/17 0500  NA 145 148*  K 3.3* 3.2*  CL 112* 114*  CO2 24 24  GLUCOSE 142* 118*  BUN 113* 93*  CREATININE 1.98* 1.79*  CALCIUM 8.3* 8.5*  MG 2.8* 2.6*  PHOS 4.7* 4.1   Liver Function Tests  Recent Labs  07/24/17 1530 07/25/17 0427 07/26/17 0500  AST 55*  --   --   ALT 204*  --   --   ALKPHOS 41  --   --   BILITOT 0.8  --   --   PROT 5.5*  --   --  ALBUMIN 2.4* 2.3* 2.3*   No results for input(s): LIPASE, AMYLASE in the last 72 hours. Cardiac Enzymes No results for input(s): CKTOTAL, CKMB, CKMBINDEX, TROPONINI in the last 72 hours.  BNP: BNP (last 3 results)  Recent Labs  07/13/17 1341  BNP 408.4*    ProBNP (last 3 results) No results for input(s): PROBNP in the last 8760 hours.   D-Dimer No results for input(s): DDIMER in the last 72 hours. Hemoglobin A1C  Recent Labs  07/24/17 0201  HGBA1C 5.8*   Fasting Lipid Panel No results for input(s): CHOL, HDL, LDLCALC, TRIG, CHOLHDL, LDLDIRECT in the last 72 hours. Thyroid Function Tests No results for input(s): TSH, T4TOTAL, T3FREE, THYROIDAB in the last 72 hours.  Invalid input(s): FREET3  Other results:   Imaging    No results found.   Medications:     Scheduled Medications: . chlorhexidine gluconate (MEDLINE KIT)  15 mL Mouth Rinse BID  . Chlorhexidine Gluconate  Cloth  6 each Topical Daily  . free water  250 mL Per Tube Q4H  . furosemide  40 mg Intravenous BID  . insulin aspart  0-20 Units Subcutaneous Q4H  . mouth rinse  15 mL Mouth Rinse 10 times per day  . pantoprazole (PROTONIX) IV  40 mg Intravenous QHS  . sodium chloride flush  10-40 mL Intracatheter Q12H  . sodium chloride flush  3 mL Intravenous Q12H    Infusions: . sodium chloride    . sodium chloride 10 mL/hr at 07/26/17 0700  . amiodarone 30 mg/hr (07/26/17 0700)  . ciprofloxacin Stopped (07/26/17 0526)  . dexmedetomidine (PRECEDEX) IV infusion 0.599 mcg/kg/hr (07/26/17 0700)  . heparin 1,300 Units/hr (07/26/17 0700)  . norepinephrine (LEVOPHED) Adult infusion 2 mcg/min (07/26/17 0800)  . thiamine injection 0 mg (07/26/17 0240)    PRN Medications: sodium chloride, fentaNYL (SUBLIMAZE) injection, glycopyrrolate, metoprolol tartrate, midazolam, nitroGLYCERIN, ondansetron (ZOFRAN) IV, sodium chloride flush, sodium chloride flush    Patient Profile   71 yo with valvular heart disease and smoking/suspected COPD, CVA with carotid stenting was admitted with dyspnea/respiratory distress.   Assessment/Plan   1. Acute hypoxemic respiratory failure: There has been a component of pulmonary edema. - CVP 9-10 - On Antibiotics (consolidated to Cipro) for suspected PNA => trach aspirate with Enterobacter and Staph.   - He is on prednisone for ? component of COPD exacerbation.  - Successful extubation 07/25/17. Mental status seems to be improving.  2. Acute on chronic diastolic CHF: Baseline has at least moderate valvular disease (moderate AS, moderate AI, moderate-severe MR).  Suspect diastolic CHF triggered by poorly tolerated paroxysmal atrial fibrillation in setting of valvular disease.  Admission thought to be triggered by diastolic CHF/pulmonary edema.   He developed AKI with lactic acidosis in the setting of hypotension with sedation.  - CVP 9-10. Continue lasix IV 40 mg BID at least  for now. Supp K.  - I/O nearly even. Weight down 4 lbs.    - Left/right heart cath if recovers.  3. Shock: Probably mixed cardiogenic/vasodilatory.  Developed in the setting of sedation with Bipap then intubation as well as bradycardia with sedation.  4. AKI: With hyperkalemia (treated) in setting of shock. Suspect ATN.  Creatinine 1.9>1.7 >2.04 > 1.99 > 1.74-> 1.84 -> 1.98 -> 1.79 - BUN and creatinine improved with IV lasix 40 mg BID yesterday.  5. Elevated LFTs:  - LFTs have improved at last check.    6. Atrial fibrillation: Paroxysmal. Back in atrial fibrillation with RVR today.   -  Remains on amiodarone gtt 30 mg/hr.   Consider transition to po now with cor-trak. Has had > 9 g load.  - Continue heparin drip.   7. Anemia: Recent fall in hgb to 8s range but has been stable here.  FOBT negative 8/6.  Fe deficient, got feraheme.   - Hgb 8.0 this am. No overt GI bleeding. Continue to follow.  8. ID: Blx CX NGTD. Suspect PNA.  On vancomycin/cefepime initially, now ciprofloxacin.   - WBCs 24.9 -> 20.8. 9. COPD: Suspected.  Active smoker.  On prednisone to cover exacerbation.  10. Valvular heart disease: Echo this admission with moderate AS, moderate AI, moderate to severe MR.  May need reassessment with TEE.  May eventually need consideration of surgical valve repair/replacement with [place] given poorly tolerated afib. 11. Hypernatremia:  - 148 today. Has been getting free water boluses.  12. Neuro: Altered mental status, does not respond to commands or open eyes.  ?Uremia + infection, ?component of ETOH withdrawal. Relatively unchanged. Continued agitation with attempts to wean.  13. Limited Code.   - Successfully extubated yesterday.  Continue to follow for improvement of mental status.   Length of Stay: [id number]  Annamaria Helling  07/26/2017, 9:17 AM  Advanced Heart Failure Team Pager [phone] (M-F; 7a - 4p)  Please contact [facility] for night-coverage after hours (4p -7a ) and  weekends on [email or web address]  Patient seen with PA, agree with the above note.  Extubated yesterday, has done well.  Still with some confusion but able to follow commands.    Cor-Track now in place and can be used for po meds.    He remains in NSR this morning, can switch to oral amiodarone.  Continue heparin gtt for now for anticoagulation.    Suspect PNA, remains on ciprofloxacin.   CVP 9-10, improving renal function.  Lasix 40 mg IV bid today.  Suspect we should be able to stop norepinephrine.   Loralie Champagne 07/26/2017 11:44 AM

## 2017-07-26 NOTE — Progress Notes (Addendum)
Chaplain stopped in to provide additional support for this family whose family member is off vent now and remaining stable.  Son and wife at bedside. Family extremely happy that he is able to communicate a little.  Patient asked Chaplain for prayer.  Chaplain and family members prayed together.  Chaplain allowed them space to reflect about this event and the many people who are praying for them.  Chaplain will remain available for support with patient and family.  Chaplain appreciates the medical team that have cared for this family.    07/26/17 1632  Clinical Encounter Type  Visited With Patient and family together  Visit Type Initial;Psychological support;Spiritual support;Social support;Post-op  Referral From Patient;Family  Consult/Referral To Chaplain  Recommendations (Continued support and follow up)  Spiritual Encounters  Spiritual Needs Prayer

## 2017-07-26 NOTE — Progress Notes (Signed)
Cortrak Tube Team Note:  Consult received to place a Cortrak feeding tube.   A 10 F Cortrak tube was placed in the right nare and secured with a nasal bridle at 75 cm. Per the Cortrak monitor reading the tube tip is post pyloric.   X-ray is required, abdominal x-ray has been ordered by the Cortrak team. Please confirm tube placement before using the Cortrak tube. (Per MD)  If the tube becomes dislodged please keep the tube and contact the Cortrak team at [email or web address] ([email or web address]) for replacement.  If after hours and replacement cannot be delayed, place a NG tube and confirm placement with an abdominal x-ray.    Mariana Single RD, LDN Clinical Nutrition Pager # [phone]

## 2017-07-26 NOTE — Progress Notes (Signed)
ANTICOAGULATION CONSULT NOTE - Follow Up Consult  Pharmacy Consult for Heparin  Indication: atrial fibrillation  Allergies  Allergen Reactions  . Bee Venom Anaphylaxis and Swelling    Patient Measurements: Height: 6\' 2"  (188 cm) Weight: 241 lb 10 oz (109.6 kg) IBW/kg (Calculated) : 82.2   Vital Signs: Temp: 97.8 F (36.6 C) (08/15 1155) Temp Source: Oral (08/15 1155) BP: 108/50 (08/15 1100) Pulse Rate: 62 (08/15 1100)  Labs:  Recent Labs  07/24/17 0201 07/25/17 0427 07/26/17 0500 07/26/17 1305  HGB 8.2* 8.2* 8.0*  --   HCT 26.9* 27.0* 26.8*  --   PLT 256 233 211  --   HEPARINUNFRC 0.38 0.26* 0.26* 0.24*  CREATININE 1.84* 1.98* 1.79*  --     Estimated Creatinine Clearance: 49.9 mL/min (A) (by C-G formula based on SCr of 1.79 mg/dL (H)).   Assessment: 70 y/o M continues on heparin drip for afib, heparin level was low at 0.26 this morning. After dose increase, level came back at 0.24. No signs or symptoms of bleeding per notes. H/H and platelets are stable.   Goal of Therapy:  Heparin level 0.3-0.7 units/ml Monitor platelets by anticoagulation protocol: Yes   Plan:  -Increase heparin to 1500 units/hr -Obtain heparin level at [employer] 07/26/2017,1:40 PM

## 2017-07-26 NOTE — Progress Notes (Signed)
Brief Nutrition Note  Consult received for enteral/tube feeding initiation and management.  Adult Enteral Nutrition Protocol initiated.   Admitting Dx: Respiratory distress [R06.03]  Body mass index is 31.02 kg/m. Pt meets criteria for obesity based on current BMI.  Labs:   Recent Labs Lab 07/24/17 0201 07/25/17 0427 07/26/17 0500  NA 147* 145 148*  K 3.6 3.3* 3.2*  CL 112* 112* 114*  CO2 23 24 24   BUN 99* 113* 93*  CREATININE 1.84* 1.98* 1.79*  CALCIUM 8.6* 8.3* 8.5*  MG 2.8* 2.8* 2.6*  PHOS  --  4.7* 4.1  GLUCOSE 154* 142* [address], LDN, CNSC [phone] Pager [phone] After Hours Pager

## 2017-07-26 NOTE — Progress Notes (Signed)
Daily Progress Note   Patient Name: Brandon [name]       Date: 07/26/2017 DOB: 08-28-1946  Age: 71 y.o. MRN#: [id number] Attending Physician: Thayer Headings, MD Primary Care Physician: Maurice Small, MD Admit Date: 07/13/2017  Reason for Consultation/Follow-up: Establishing goals of care  Subjective: Mr. Hiney is more alert today. Still confused and restless but trying to talk more and nodding head to questions. Still with weak cough and protection of airway.     Length of Stay: 12  Current Medications: Scheduled Meds:  . chlorhexidine gluconate (MEDLINE KIT)  15 mL Mouth Rinse BID  . Chlorhexidine Gluconate Cloth  6 each Topical Daily  . free water  250 mL Per Tube Q6H  . furosemide  40 mg Intravenous BID  . insulin aspart  0-20 Units Subcutaneous Q4H  . mouth rinse  15 mL Mouth Rinse 10 times per day  . pantoprazole sodium  20 mg Per Tube Daily  . potassium chloride  40 mEq Oral BID  . sodium chloride flush  10-40 mL Intracatheter Q12H  . sodium chloride flush  3 mL Intravenous Q12H    Continuous Infusions: . sodium chloride    . sodium chloride 10 mL/hr at 07/26/17 0700  . amiodarone 30 mg/hr (07/26/17 0700)  . ciprofloxacin Stopped (07/26/17 0526)  . dexmedetomidine (PRECEDEX) IV infusion 0.599 mcg/kg/hr (07/26/17 0700)  . heparin 1,300 Units/hr (07/26/17 0700)  . norepinephrine (LEVOPHED) Adult infusion 2 mcg/min (07/26/17 0800)  . potassium chloride      PRN Meds: sodium chloride, fentaNYL (SUBLIMAZE) injection, glycopyrrolate, metoprolol tartrate, midazolam, nitroGLYCERIN, ondansetron (ZOFRAN) IV, sodium chloride flush, sodium chloride flush  Physical Exam  Constitutional: He appears well-developed.  HENT:  Head: Normocephalic and atraumatic.  Cardiovascular:  An irregularly irregular rhythm present. Bradycardia present.   Pulmonary/Chest: No accessory muscle usage. No tachypnea. No respiratory distress. He has rhonchi. He has rales.  Abdominal: Soft. Normal appearance.  Neurological: He is alert. He is disoriented.  Nursing note and vitals reviewed.           Vital Signs: BP (!) 119/48   Pulse (!) 59   Temp 98.3 F (36.8 C) (Oral)   Resp (!) 23   Ht '6\' 2"'  (1.88 m)   Wt 109.6 kg (241 lb 10 oz)   SpO2 99%  BMI 31.02 kg/m  SpO2: SpO2: 99 % O2 Device: O2 Device: Nasal Cannula O2 Flow Rate: O2 Flow Rate (L/min): 4 L/min  Intake/output summary:   Intake/Output Summary (Last 24 hours) at 07/26/17 0959 Last data filed at 07/26/17 0900  Gross per 24 hour  Intake          1895.96 ml  Output             3210 ml  Net         -1314.04 ml   LBM: Last BM Date: 07/25/17 Baseline Weight: Weight: 109.8 kg (242 lb) Most recent weight: Weight: 109.6 kg (241 lb 10 oz)       Palliative Assessment/Data: 20%      Patient Active Problem List   Diagnosis Date Noted  . Agitation   . Pressure injury of skin 07/20/2017  . Ventilator dependence ([place])   . Goals of care, counseling/discussion   . Palliative care encounter   . Acute respiratory failure with hypoxemia ([place])   . Acute pulmonary edema ([facility])   . Acute diastolic heart failure ([place])   . Hypotensive episode   . Arrhythmia 07/13/2017  . Aortic stenosis, moderate 07/13/2017  . Anemia 07/13/2017  . Dyspnea 07/13/2017  . Respiratory distress 07/13/2017  . S/P left rotator cuff repair 05/18/2017  . Non-rheumatic mitral regurgitation   . HOH (hard of hearing) 06/02/2013  . Obesity (BMI 30-39.9) 06/02/2013  . Acute cholecystitis with chronic cholecystitis 06/01/2013  . Calculus of bile duct without mention of cholecystitis or obstruction 05/31/2013  . Nonspecific elevation of levels of transaminase or lactic acid dehydrogenase (LDH) 05/31/2013  . Nonspecific (abnormal) findings on  radiological and other examination of biliary tract 05/31/2013  . TOBACCO ABUSE 02/22/2010  . DYSLIPIDEMIA 06/25/2009  . MITRAL INSUFFICIENCY 06/25/2009  . Essential hypertension 06/25/2009  . Aortic valve disorder 06/25/2009  . VALVULAR HEART DISEASE 06/25/2009  . Cardiovascular disease 06/25/2009  . CEREBROVASCULAR DISEASE 06/25/2009  . CEREBROVASCULAR ACCIDENT WITH RIGHT HEMIPARESIS 06/25/2009  . COPD exacerbation ([place]) 06/25/2009  . FOOT SURGERY, HX OF 06/25/2009  . POLYPECTOMY, HX OF 06/25/2009  . TONSILLECTOMY, HX OF 06/25/2009    Palliative Care Assessment & Plan   HPI: 72 y.o. male  with past medical history of CVA, HTN, HL, AS, MR, [name], current tobacco use (since 71 yo), remote hx colon polyps admitted on 07/13/2017 with chest pain, diaphoresis, elevated troponin. Hospital stay complicated by afib RVR and volume overload leading to BiPAP and ultimately ventilator 8/3. Has continued to have issues with afib/tachycardia and agitation and has not made much progress on ventilator. Family questioning if these aggressive measures are what he would want and expectations with continuing aggressive support.   Assessment: Family continues to be at bedside and pleased with Mr. Kruzel progress since extubation. They continue to be cautiously optimistic and aware that he has many barriers to overcome. Discussed plan for therapies and pulmonary hygiene. Continue supportive care with hopes for continued improvement but no invasive procedures or aggressive measures desired. Family concerned about his needs over night and I encouraged them to go home and get rest and be here during the day. Emotional support provided.   Recommendations/Plan:  One way extubation completed 07/25/17.   Dyspnea/pain: Fentanyl 25-100 mcg every hour prn.   Agitation: Versed 1-2 mg every 4 hours prn.   Robinul 0.2 IV every 4 hours prn.   Code Status:  DNR  Prognosis:   Unable to  determine  Discharge Planning:  To Be Determined  Care plan was discussed with Corene Cornea RN, Dr. Ashok Cordia  Thank you for allowing the Palliative Medicine Team to assist in the care of this patient.   Total Time 19mn Prolonged Time Billed  no      Greater than 50%  of this time was spent counseling and coordinating care related to the above assessment and plan.  AVinie Sill NP Palliative Medicine Team Pager # [phone](M-F 8a-5p) Team Phone # [phone](Nights/Weekends)

## 2017-07-26 NOTE — Progress Notes (Signed)
ANTICOAGULATION CONSULT NOTE - Follow Up Consult  Pharmacy Consult for Heparin  Indication: atrial fibrillation  Allergies  Allergen Reactions  . Bee Venom Anaphylaxis and Swelling    Patient Measurements: Height: 6\' 2"  (188 cm) Weight: 241 lb 10 oz (109.6 kg) IBW/kg (Calculated) : 82.2   Vital Signs: Temp: 97.8 F (36.6 C) (08/15 0343) Temp Source: Oral (08/15 0343) BP: 107/53 (08/15 0400) Pulse Rate: 64 (08/15 0400)  Labs:  Recent Labs  07/24/17 0201 07/25/17 0427 07/26/17 0500  HGB 8.2* 8.2*  --   HCT 26.9* 27.0*  --   PLT 256 233  --   HEPARINUNFRC 0.38 0.26* 0.26*  CREATININE 1.84* 1.98*  --     Estimated Creatinine Clearance: 45.1 mL/min (A) (by C-G formula based on SCr of 1.98 mg/dL (H)).   Assessment: 71 y/o M continues on heparin drip for afib, heparin level is low at 0.26, pt was a one-way extubation 8/14  Goal of Therapy:  Heparin level 0.3-0.7 units/ml Monitor platelets by anticoagulation protocol: Yes   Plan:  -Inc heparin to 1300 units/hr -1200 HL  Trayquan Kolakowski 07/26/2017,5:17 AM

## 2017-07-27 LAB — GLUCOSE, CAPILLARY
GLUCOSE-CAPILLARY: 115 mg/dL — AB (ref 65–99)
GLUCOSE-CAPILLARY: 118 mg/dL — AB (ref 65–99)
GLUCOSE-CAPILLARY: 135 mg/dL — AB (ref 65–99)
GLUCOSE-CAPILLARY: 121 mg/dL — AB (ref 65–99)
Glucose-Capillary: 125 mg/dL — ABNORMAL HIGH (ref 65–99)
Glucose-Capillary: 127 mg/dL — ABNORMAL HIGH (ref 65–99)

## 2017-07-27 LAB — CBC WITH DIFFERENTIAL/PLATELET
BASOS ABS: 0 10*3/uL (ref 0.0–0.1)
Basophils Relative: 0 %
EOS PCT: 0 %
Eosinophils Absolute: 0.1 10*3/uL (ref 0.0–0.7)
HEMATOCRIT: 27.4 % — AB (ref 39.0–52.0)
Hemoglobin: 8.2 g/dL — ABNORMAL LOW (ref 13.0–17.0)
LYMPHS PCT: 5 %
Lymphs Abs: 1 10*3/uL (ref 0.7–4.0)
MCH: 29.5 pg (ref 26.0–34.0)
MCHC: 29.9 g/dL — ABNORMAL LOW (ref 30.0–36.0)
MCV: 98.6 fL (ref 78.0–100.0)
Monocytes Absolute: 0.8 10*3/uL (ref 0.1–1.0)
Monocytes Relative: 4 %
NEUTROS ABS: 17.5 10*3/uL — AB (ref 1.7–7.7)
Neutrophils Relative %: 91 %
PLATELETS: 211 10*3/uL (ref 150–400)
RBC: 2.78 MIL/uL — AB (ref 4.22–5.81)
RDW: 18.2 % — ABNORMAL HIGH (ref 11.5–15.5)
WBC: 19.4 10*3/uL — AB (ref 4.0–10.5)

## 2017-07-27 LAB — RENAL FUNCTION PANEL
Albumin: 2.4 g/dL — ABNORMAL LOW (ref 3.5–5.0)
Anion gap: 9 (ref 5–15)
BUN: 66 mg/dL — AB (ref 6–20)
CALCIUM: 8.6 mg/dL — AB (ref 8.9–10.3)
CO2: 25 mmol/L (ref 22–32)
CREATININE: 1.46 mg/dL — AB (ref 0.61–1.24)
Chloride: 117 mmol/L — ABNORMAL HIGH (ref 101–111)
GFR calc Af Amer: 54 mL/min — ABNORMAL LOW (ref >60)
GFR calc non Af Amer: 47 mL/min — ABNORMAL LOW (ref >60)
GLUCOSE: 130 mg/dL — AB (ref 65–99)
Phosphorus: 3.1 mg/dL (ref 2.5–4.6)
Potassium: 3.2 mmol/L — ABNORMAL LOW (ref 3.5–5.1)
SODIUM: 151 mmol/L — AB (ref 135–145)

## 2017-07-27 LAB — HEPATIC FUNCTION PANEL
ALBUMIN: 2.4 g/dL — AB (ref 3.5–5.0)
ALT: 109 U/L — ABNORMAL HIGH (ref 17–63)
AST: 39 U/L (ref 15–41)
Alkaline Phosphatase: 46 U/L (ref 38–126)
BILIRUBIN INDIRECT: 1 mg/dL — AB (ref 0.3–0.9)
Bilirubin, Direct: 0.5 mg/dL (ref 0.1–0.5)
Total Bilirubin: 1.5 mg/dL — ABNORMAL HIGH (ref 0.3–1.2)
Total Protein: 5.8 g/dL — ABNORMAL LOW (ref 6.5–8.1)

## 2017-07-27 LAB — HEPARIN LEVEL (UNFRACTIONATED): HEPARIN UNFRACTIONATED: 0.32 [IU]/mL (ref 0.30–0.70)

## 2017-07-27 LAB — MAGNESIUM: Magnesium: 2.7 mg/dL — ABNORMAL HIGH (ref 1.7–2.4)

## 2017-07-27 MED ORDER — MIDAZOLAM HCL 2 MG/2ML IJ SOLN
0.5000 mg | INTRAMUSCULAR | Status: DC | PRN
  Administered 2017-07-30 – 2017-08-04 (×7): 0.5 mg via INTRAVENOUS
  Filled 2017-07-27 (×7): qty 2

## 2017-07-27 MED ORDER — FENTANYL CITRATE (PF) 100 MCG/2ML IJ SOLN
25.0000 ug | INTRAMUSCULAR | Status: DC | PRN
  Administered 2017-07-31 – 2017-08-02 (×7): 100 ug via INTRAVENOUS
  Administered 2017-08-04: 50 ug via INTRAVENOUS
  Administered 2017-08-05 – 2017-08-06 (×10): 100 ug via INTRAVENOUS
  Administered 2017-08-07: 25 ug via INTRAVENOUS
  Filled 2017-07-27 (×19): qty 2

## 2017-07-27 MED ORDER — POTASSIUM CHLORIDE 10 MEQ/50ML IV SOLN
10.0000 meq | INTRAVENOUS | Status: AC
  Administered 2017-07-27 (×5): 10 meq via INTRAVENOUS
  Filled 2017-07-27 (×2): qty 50

## 2017-07-27 MED ORDER — AMIODARONE HCL IN DEXTROSE 360-4.14 MG/200ML-% IV SOLN
30.0000 mg/h | INTRAVENOUS | Status: DC
  Administered 2017-07-27 – 2017-07-29 (×6): 30 mg/h via INTRAVENOUS
  Filled 2017-07-27 (×6): qty 200

## 2017-07-27 MED ORDER — JEVITY 1.2 CAL PO LIQD
1000.0000 mL | ORAL | Status: DC
  Administered 2017-07-27: 55 mL/h
  Administered 2017-07-30 – 2017-08-01 (×2): 1000 mL
  Filled 2017-07-27 (×11): qty 1000

## 2017-07-27 MED ORDER — AMIODARONE LOAD VIA INFUSION
150.0000 mg | Freq: Once | INTRAVENOUS | Status: AC
  Administered 2017-07-27: 150 mg via INTRAVENOUS
  Filled 2017-07-27: qty 83.34

## 2017-07-27 MED ORDER — AMIODARONE IV BOLUS ONLY 150 MG/100ML: 150.0000 mg | Freq: Once | INTRAVENOUS | Status: DC

## 2017-07-27 MED ORDER — POTASSIUM CHLORIDE 10 MEQ/100ML IV SOLN: 10.0000 meq | INTRAVENOUS | Status: DC

## 2017-07-27 MED ORDER — PRO-STAT SUGAR FREE PO LIQD
30.0000 mL | Freq: Three times a day (TID) | ORAL | Status: DC
  Administered 2017-07-27 – 2017-08-03 (×21): 30 mL
  Filled 2017-07-27 (×24): qty 30

## 2017-07-27 NOTE — Progress Notes (Signed)
Cardiology PA, Oda Kilts, paged due to pt's wife expressing concern about pt's speech being more slurred than she noted this morning. Pt also noted to have small right sided facial droop when smiling. Hand grips weak bilaterally. Pt alert and follows commands, no pronator drift noted. A small right facial droop was noted earlier when CCM was rounding, but this resolved and pt's face was symmetrical the rest of the afternoon. With onset of droop again, PA was called. Jonni Sanger no longer here after 5pm, so he directed RN to page Reino Bellis, evening Cards PA. Ria Comment to come assess pt at bedside.

## 2017-07-27 NOTE — Progress Notes (Signed)
    Called to see patient for possible right sided facial droop noted today. RN states she notice this morning, but then resolved. Family has been at the bedside today, states they felt that his speech has been somewhat slurred. On exam patient does appear to have mild right sided facial droop, but grip strengths weak on both sides. Was extubated on 8/14, and still having some difficulty overall with confusion and speech. Patient had worked PT today and reports feeling tired. Neurological intact. Will defer CT at this time. Family at the beside updated.   Barnet Pall, NP-C 07/27/2017, 6:27 PM Pager: [phone]

## 2017-07-27 NOTE — Progress Notes (Addendum)
Rehab Admissions Coordinator Note:  Patient was screened by Cleatrice Burke for appropriateness for an Inpatient Acute Rehab Consult per PT recommendation. Pt not yet at a level that would  Be appropriate for intense therapies or would be approved by [facility] . I will follow his progress to assess if CIR vs SNF most appropriate. At this time, we are recommending [facility] unless his tolerance for more intense therapies improves. I will follow.   Cleatrice Burke 07/27/2017, 1:24 PM  I can be reached at [phone].

## 2017-07-27 NOTE — Progress Notes (Signed)
OT Cancellation Note  Patient Details Name: Brandon [name] MRN: [id number] DOB: 1946-11-22   Cancelled Treatment:    Reason Eval/Treat Not Completed: Other (comment). Pt on bedrest with bathroom privileges. Will await increase in activity orders.  Zena, OT/L  [phone] 07/27/2017 07/27/2017, 8:56 AM

## 2017-07-27 NOTE — Progress Notes (Signed)
ANTICOAGULATION CONSULT NOTE - Follow Up Consult  Pharmacy Consult for Heparin  Indication: atrial fibrillation  Allergies  Allergen Reactions  . Bee Venom Anaphylaxis and Swelling    Patient Measurements: Height: 6\' 2"  (188 cm) Weight: 237 lb 3.4 oz (107.6 kg) IBW/kg (Calculated) : 82.2   Vital Signs: Temp: 98.2 F (36.8 C) (08/16 0406) Temp Source: Oral (08/16 0406) BP: 135/68 (08/16 0700) Pulse Rate: 86 (08/16 0700)  Labs:  Recent Labs  07/25/17 0427 07/26/17 0500 07/26/17 1305 07/26/17 1920 07/27/17 0423 07/27/17 0424  HGB 8.2* 8.0*  --   --  8.2*  --   HCT 27.0* 26.8*  --   --  27.4*  --   PLT 233 211  --   --  211  --   HEPARINUNFRC 0.26* 0.26* 0.24* 0.32  --  0.32  CREATININE 1.98* 1.79*  --   --  1.46*  --     Estimated Creatinine Clearance: 60.7 mL/min (A) (by C-G formula based on SCr of 1.46 mg/dL (H)).   Assessment: 71 y/o M continues on heparin drip for afib. Heparin level yesterday came back at 0.32, within goal range. Level this morning came back therapeutic again at 0.32. Urine was slightly pink in color, will continue to follow along and monitor.   Goal of Therapy:  Heparin level 0.3-0.7 units/ml Monitor platelets by anticoagulation protocol: Yes   Plan:  Continue heparin at 1500 units / hr Follow up AM heparin level, CBC  Thank you Doylene Canard, PharmD Clinical Pharmacist  Pager: [phone] 07/27/2017,7:38 AM

## 2017-07-27 NOTE — Evaluation (Signed)
Clinical/Bedside Swallow Evaluation Patient Details  Name: Brandon [name] MRN: [id number] Date of Birth: Aug 17, 1946  Today's Date: 07/27/2017 Time: SLP Start Time (ACUTE ONLY): 38 SLP Stop Time (ACUTE ONLY): 1123 SLP Time Calculation (min) (ACUTE ONLY): 18 min  Past Medical History:  Past Medical History:  Diagnosis Date  . Anemia 07/13/2017  . Anxiety   . Aortic insufficiency   . Aortic stenosis, moderate 07/13/2017  . Arthritis    back   . Carotid stenosis    Right carotid stent (widely patent) 40 - 59% left plaque 11/13  . Depression   . Dyslipidemia   . GERD (gastroesophageal reflux disease)   . Heart murmur   . Hemiplegia affecting unspecified side, late effect of cerebrovascular disease    resolved- from L side   . Hypertension   . Jaundice    resolved following ERCP & Cholecystectomy  . Mitral valve insufficiency and aortic valve insufficiency   . Pre-diabetes    per spouse  . Sleep apnea    does not wear CPAP  . Sleep concern    resulted in surgery- after + sleep test. Pt. doesn't have a problem any longer.   . Stroke ([place]) 03/11/2003   stent placed on the 31, 3, 2004, L side   . Wears glasses   . Wears hearing aid in both ears   . Wears partial dentures    Past Surgical History:  Past Surgical History:  Procedure Laterality Date  . BACK SURGERY     lumbar back  . CHOLECYSTECTOMY    . ERCP N/A 05/31/2013   Procedure: ENDOSCOPIC RETROGRADE CHOLANGIOPANCREATOGRAPHY (ERCP);  Surgeon: Ladene Artist, MD;  Location: [facility];  Service: Endoscopy;  Laterality: N/A;  . FOOT SURGERY     right  . LAPAROSCOPIC CHOLECYSTECTOMY SINGLE PORT N/A 06/01/2013   Procedure: LAPAROSCOPIC CHOLECYSTECTOMY SINGLE PORT;  Surgeon: Adin Hector, MD;  Location: [facility];  Service: General;  Laterality: N/A;  . POLYPECTOMY    . SHOULDER ARTHROSCOPY WITH ROTATOR CUFF REPAIR AND SUBACROMIAL DECOMPRESSION Left 05/18/2017  . SHOULDER ARTHROSCOPY WITH ROTATOR CUFF REPAIR AND  SUBACROMIAL DECOMPRESSION Left 05/18/2017   Procedure: SHOULDER ARTHROSCOPY WITH ROTATOR CUFF REPAIR AND SUBACROMIAL DECOMPRESSION;  Surgeon: Tania Ade, MD;  Location: [facility];  Service: Orthopedics;  Laterality: Left;  LEFT SHOULDER ARTHROSCOPY WITH ROTATOR CUFF REPAIR AND SUBACROMIAL DECOMPRESSION  . TEE WITHOUT CARDIOVERSION N/A 05/09/2017   Procedure: TRANSESOPHAGEAL ECHOCARDIOGRAM (TEE);  Surgeon: Skeet Latch, MD;  Location: [facility] ENDOSCOPY;  Service: Cardiovascular;  Laterality: N/A;  . TONSILLECTOMY     HPI:  71 yo with valvular heart disease and smoking/suspected COPD, CVA with carotid stenting was admitted with dyspnea/respiratory distress 8/2, placed on Bipap but required intubated 8/3-8/14.    Assessment / Plan / Recommendation Clinical Impression  Patient presents with a severe acute reversible dysphagia s/p prolonged intubation characterized by severe dysphonia indicative of decreased glottal closure for airway protection, suspected pharyngeal weakness, and positive signs of aspiration at saliva at baseline as well as with po trials. Additionally, cognitive deficits result in prolonged oral transit with periods of oral holding and absent pharyngeal swallow. Aspiration risk severe. Prognosis for ability to resume a po diet good with time off vent.  SLP Visit Diagnosis: Dysphagia, oropharyngeal phase (R13.12)    Aspiration Risk  Severe aspiration risk    Diet Recommendation NPO;Alternative means - temporary   Medication Administration: Via alternative means    Other  Recommendations Oral Care Recommendations: Oral care QID  Follow up Recommendations Other (comment) (TBD)      Frequency and Duration min 3x week  2 weeks       Prognosis Prognosis for Safe Diet Advancement: Good Barriers to Reach Goals: Severity of deficits      Swallow Study   General HPI: 71 yo with valvular heart disease and smoking/suspected COPD, CVA with carotid stenting was admitted with  dyspnea/respiratory distress 8/2, placed on Bipap but required intubated 8/3-8/14.  Type of Study: Bedside Swallow Evaluation Previous Swallow Assessment: none Diet Prior to this Study: NPO Temperature Spikes Noted: No Respiratory Status: Nasal cannula History of Recent Intubation: Yes Length of Intubations (days): 9 days Date extubated: 07/25/17 Behavior/Cognition: Alert;Confused;Requires cueing;Distractible Oral Cavity Assessment: Other (comment) (white lingual coating) Oral Care Completed by SLP: Yes Oral Cavity - Dentition: Missing dentition;Dentures, not available Vision: Functional for self-feeding Self-Feeding Abilities: Needs assist Patient Positioning: Upright in chair Baseline Vocal Quality: Hoarse (severe) Volitional Cough: Weak;Congested Volitional Swallow: Unable to elicit    Oral/Motor/Sensory Function Overall Oral Motor/Sensory Function: Mild impairment Facial ROM: Reduced right Facial Symmetry: Abnormal symmetry right Facial Strength: Reduced right;Reduced left Facial Sensation: Within Functional Limits Lingual ROM: Within Functional Limits Lingual Symmetry: Within Functional Limits Lingual Strength: Reduced Lingual Sensation: Within Functional Limits Velum: Within Functional Limits Mandible: Within Functional Limits   Ice Chips Ice chips: Impaired Presentation: Spoon Oral Phase Functional Implications: Prolonged oral transit Pharyngeal Phase Impairments: Wet Vocal Quality;Throat Clearing - Delayed;Cough - Delayed (absent pharyngeal swallow in 50% of trials)   Thin Liquid Thin Liquid: Not tested    Nectar Thick Nectar Thick Liquid: Not tested   Honey Thick Honey Thick Liquid: Not tested   Puree Puree: Not tested   Solid   Brandon Zogg MA, CCC-SLP [phone]  Solid: Not tested        Brandon [name] Brandon [name] 07/27/2017,12:36 PM

## 2017-07-27 NOTE — Progress Notes (Signed)
PULMONARY / CRITICAL CARE MEDICINE   Name: Brandon [name] MRN: [id number] DOB: 02-Jun-1946    ADMISSION DATE:  07/13/2017 CONSULTATION DATE:  07/14/17 REFERRING MD:  Dr. Acie Fredrickson   CHIEF COMPLAINT:  Respiratory distress  BRIEF HPI:  71 y.o. heavy smoker admitted with acute hypoxic respiratory failure along with atrial fibrillation with rapid ventricular response. Patient was found to be in volume overload. With confusion occurring after admission and hypotension on Precedex in the setting of acute on chronic renal failure patient was intubated on 8/3.  SUBJECTIVE:  Feeling much better.  Off pressors.  Remains on amio gtt.  Awake, wife at bedside.   VITAL SIGNS: BP 135/68   Pulse 86   Temp 97.7 F (36.5 C) (Oral)   Resp (!) 23   Ht 6\' 2"  (1.88 m)   Wt 107.6 kg (237 lb 3.4 oz)   SpO2 100%   BMI 30.46 kg/m   HEMODYNAMICS: CVP:  [7 mmHg-8 mmHg] 7 mmHg  VENTILATOR SETTINGS:    INTAKE / OUTPUT: I/O last 3 completed shifts: In: 3982.5 [I.V.:1456.5; Other:250; ML/YY:5035; IV Piggyback:700] Out: 4656 [Urine:5510; Emesis/NG output:250]   PHYSICAL EXAMINATION: General: awake, Wife at bedside. Integument: Warm. Dry. No rash. HEENT:  No scleral icterus. Tachycardia mucous membranes. Poor dentition. Cardiovascular:  Regular rate. Unable to appreciate JVD with body habitus Pulmonary:  resps even non labored on [place], coarse  Abdomen: Protuberant. Soft. Normal bowel sounds. Neurological: Moving all 4 extremities equally. Attends to voice. Hard of hearing.  LABS:  BMET  Recent Labs Lab 07/25/17 0427 07/26/17 0500 07/27/17 0423  NA 145 148* 151*  K 3.3* 3.2* 3.2*  CL 112* 114* 117*  CO2 24 24 25   BUN 113* 93* 66*  CREATININE 1.98* 1.79* 1.46*  GLUCOSE 142* 118* 130*    Electrolytes  Recent Labs Lab 07/25/17 0427 07/26/17 0500 07/26/17 1715 07/27/17 0423  CALCIUM 8.3* 8.5*  --  8.6*  MG 2.8* 2.6* 2.4 2.7*  PHOS 4.7* 4.1 3.7 3.1    CBC  Recent Labs Lab  07/25/17 0427 07/26/17 0500 07/27/17 0423  WBC 24.9* 20.8* 19.4*  HGB 8.2* 8.0* 8.2*  HCT 27.0* 26.8* 27.4*  PLT 233 211 211    Coag's No results for input(s): APTT, INR in the last 168 hours.  Sepsis Markers No results for input(s): LATICACIDVEN, PROCALCITON, O2SATVEN in the last 168 hours.  ABG No results for input(s): PHART, PCO2ART, PO2ART in the last 168 hours.  Liver Enzymes  Recent Labs Lab 07/21/17 0109 07/24/17 1530  07/26/17 0500 07/27/17 0418 07/27/17 0423  AST 86* 55*  --   --  39  --   ALT 621* 204*  --   --  109*  --   ALKPHOS 57 41  --   --  46  --   BILITOT 1.0 0.8  --   --  1.5*  --   ALBUMIN 2.9* 2.4*  < > 2.3* 2.4* 2.4*  < > = values in this interval not displayed.  Cardiac Enzymes No results for input(s): TROPONINI, PROBNP in the last 168 hours.  Glucose  Recent Labs Lab 07/26/17 0849 07/26/17 1150 07/26/17 1536 07/26/17 1926 07/26/17 2337 07/27/17 0348  GLUCAP 114* 110* 107* 128* 94 115*    Imaging Dg Abd Portable 1v  Result Date: 07/26/2017 CLINICAL DATA:  Feeding tube placement. EXAM: PORTABLE ABDOMEN - 1 VIEW COMPARISON:  Radiograph of July 15, 2017. FINDINGS: The bowel gas pattern is normal. Distal tip of feeding tube is seen  in expected position of distal stomach. IMPRESSION: No evidence of bowel obstruction or ileus. Distal tip of feeding tube is seen in expected position of distal stomach. Electronically Signed   By: Marijo Conception, M.D.   On: 07/26/2017 11:04    IMAGING/STUDIES: TTE 8/3:  LV moderately dilated with moderate concentric hypertrophy. EF 55-60% with akinesis of basal inferior myocardium and grade 2 diastolic dysfunction. LA moderately dilated & RA normal in size. RV normal in size and function. Moderate aortic regurgitation and moderate stenosis. Aortic root normal in size. Moderate to severe mitral regurgitation without stenosis. Trivial pulmonic regurgitation without stenosis. No tricuspid regurgitation. No  pericardial effusion.  BILAT LE VENOUS DUPLEX 8/4:  Negative for DVT or SVT. LUE VENOUS DUPLEX 8/7:  SVT of basilic vein.  CT HEAD W/O 8/9: IMPRESSION: 1. No acute intracranial process. 2. Large remote right MCA territory infarct. 3. Underlying generalized age-related cerebral atrophy. 4. Chronic left maxillary sinusitis. PORT CXR 8/11:  Previously reviewed by me. Right internal jugular central venous catheter in position. Endotracheal tube in good position. Enteric feeding tube course will diaphragm. Bilateral lower lung opacities consistent with pleural effusions vs consolidation present. PORT CXR 8/12:  Previously reviewed by me. Endotracheal tube in good position. Enteric feeding tube coursing below the diaphragm. Right internal jugular central venous catheter in good position. Bilateral lower lung opacities persist and are unchanged.  MICROBIOLOGY: MRSA PCR 8/2:  Negative  Blood Cultures x2 8/3:  Negative  Tracheal Aspirate Culture 8/8:  MSSA & Enterobacter   ANTIBIOTICS: Zosyn 8/3 - 8/6 Cefepime 8/9 - 8/12 Vancomycin 8/3 - 8/5; 8/10 - 8/12 Cipro 8/12 >>>  SIGNIFICANT EVENTS: 08/02 - Admit in A fib w/ RVR and volume overload 08/03 - PCCM consulted for respiratory distress & patient intubated 08/09 - Improving rate control but worsening renal function w/ diuresis 08/11 - Increasing movements but still not responding appropriately. Off diuretics. 08/12 - Antibiotics narrowed to Cipro given culture results & Klonopin stopped. Precedex started. 08/13 - Weaning on PS w/ tachypnea to 40s 08/14 - One-way extubation    LINES/TUBES: OETT 8/3 - 8/14 OGT 8/3 - 8/14 RECTAL TUBE (out) R IJ CVL 8/3 >>> Foley >>> PIV  ASSESSMENT / PLAN:  PULMONARY A: Acute hypoxic respiratory failure Tobacco use disorder Probable COPD with acute exacerbation Bilateral pleural effusions History of OSA: Not currently on CPAP.  P:   Continuing to wean FiO2 Incentive spirometry for pulmonary  toilette Mobilize - pt/ot   CARDIOVASCULAR A:  Shock: Likely combination cardiogenic and sepsis. Atrial fibrillation with rapid ventricular response Acute decompensated diastolic congestive heart failure History of aortic stenosis, aortic regurgitation, and mitral regurgitation History of carotid artery stenosis History dyslipidemia  P:  Continuing monitoring per unit protocol Continuous telemetry monitoring Amiodarone drip Weaning vasopressor support Remainder of care as per primary service  RENAL A:   Acute renal failure: Stable.  Hypernatremia: Resolved. Hyperchloremia: Stable. Hypokalemia: Replaced.  P:   Replete K PRN Trending electrolytes daily Monitoring urine output with Foley  GASTROINTESTINAL A:   Shock liver: Continuing to improve. History of GERD  P:   Switching to Protonix via tube Trending hepatic function panel intermittently  HEMATOLOGIC A:   Anemia: Hemoglobin remains stable. No evidence of active bleeding. Leukocytosis: Improving. Likely secondary to sepsis.  P:  Trending cell counts daily with CBC Transfuse for Hgb <7.0 or active bleeding Heparin drip per pharmacy protocol  INFECTIOUS A:   MSSA & Enterobacter Pneumonia Sepsis  P:   Continuing  Cipro day #5/10 May be able to discontinue antibiotic regimen 48 hours after leukocytosis normalizes Plan to repeat culture for fever  ENDOCRINE A:   Hyperglycemia: History of "prediabetes". Glucose controlled.  P:   Continuing Accu-Cheks every 4 hours Sliding-scale insulin for resistant algorithm Checking Hgb A1c  NEUROLOGIC A:   Acute encephalopathy: Multifactorial from benzodiazepines, sepsis, and hypoxia. Possible slow improvement. Normal B12 & TSH. History of alcohol use: At risk for withdrawal possibly. Sedation on ventilator History of depression  P:   RASS Goal:  0\ Weaning Precedex drip Continuing high-dose IV thiamine Other medications as per palliative medicine  service  Prophylaxis:  Heparin drip per protocol & Protonix via tube QHS. Diet:  NPO. Holding tube feedings in anticipation of extubation. Code Status:  Partial Code per previous physician discussions.  Disposition:  Prognosis remains guarded. Continuing close ICU monitoring wall weaning vasopressor and Precedex infusions. Family Discussion/Update: Wife updated at the time of my exam.   PCCM singing off, please call back if needed.    Nickolas Madrid, NP 07/27/2017  9:50 AM Pager: [phone] or [phone]

## 2017-07-27 NOTE — Evaluation (Signed)
Physical Therapy Evaluation Patient Details Name: Brandon [name] MRN: [id number] DOB: 07-Feb-1946 Today's Date: 07/27/2017   History of Present Illness  71 y.o. heavy smoker admitted with acute hypoxic respiratory failure along with atrial fibrillation with rapid ventricular response. Patient was found to be in volume overload. With confusion occurring after admission and hypotension on Precedex in the setting of acute on chronic renal failure patient was intubated on 8/3. Intubated 8/3-8/14.    Clinical Impression  Pt admitted with above diagnosis. Pt currently with functional limitations due to the deficits listed below (see PT Problem List). Pt was able to transfer to chair with mod to max assist scoot pivot with use of pad.  Pt fatigued quickly.  Pt was able to stand x 1 with max of 2 assist as well but could not stand for over 10 seconds due to fatigue.  Feel that Rehab would be a good plan for pt to gain strength and endurance. Pt will benefit from skilled PT to increase their independence and safety with mobility to allow discharge to the venue listed below.      Follow Up Recommendations CIR;Supervision/Assistance - 24 hour    Equipment Recommendations  Rolling walker with 5" wheels;3in1 (PT);Wheelchair (measurements PT);Wheelchair cushion (measurements PT)    Recommendations for Other Services Rehab consult     Precautions / Restrictions Precautions Precautions: Fall Restrictions Weight Bearing Restrictions: No      Mobility  Bed Mobility Overal bed mobility: Needs Assistance Bed Mobility: Supine to Sit     Supine to sit: Max assist;+2 for physical assistance     General bed mobility comments: Pt needed assist to move LEs off bed and for elevation of trunk. Used pad to scoot pt to EOB.    Transfers Overall transfer level: Needs assistance Equipment used: Rolling walker (2 wheeled) Transfers: Sit to/from Stand;Lateral/Scoot Transfers Sit to Stand: Max assist;+2  physical assistance;From elevated surface        Lateral/Scoot Transfers: Mod assist;+2 physical assistance;Max assist General transfer comment: Pt took 2 attempts to stand to RW.  Pt needed assist to power up and pulled up on RW.  Only maintained standing for a few seconds and sat back down.  Pt very fatigued therefore decided to drop arm of recliner and scoot pt to recliner with pt needed mod to max asssist and used pad as well.  Pt squatted intiailly to perform a squat pivot and then sat when 1/2 way there therefore had to get pt to squat/scoot again to complete the transfer with mod assist use of pad to asssit.   Ambulation/Gait                Stairs            Wheelchair Mobility    Modified Rankin (Stroke Patients Only)       Balance Overall balance assessment: Needs assistance;History of Falls Sitting-balance support: Bilateral upper extremity supported;Feet supported Sitting balance-Leahy Scale: Zero Sitting balance - Comments: Pt needed max assist to mod assist at times to sit EOB with pt leaning all directions but worse to left and posterior.   Postural control: Left lateral lean;Posterior lean Standing balance support: Bilateral upper extremity supported;During functional activity Standing balance-Leahy Scale: Poor Standing balance comment: Required RW and max assist to stand with rW.                              Pertinent Vitals/Pain Pain Assessment: No/denies  pain   127-146 bpm, 97% on 2LO2.  BP 108/68.   Home Living Family/patient expects to be discharged to:: Private residence Living Arrangements: Spouse/significant other Available Help at Discharge: Family;Friend(s);Available 24 hours/day Type of Home: House Home Access: Stairs to enter Entrance Stairs-Rails: None Entrance Stairs-Number of Steps: 1 + 1 Home Layout: One level Home Equipment: None      Prior Function Level of Independence: Independent               Hand  Dominance        Extremity/Trunk Assessment   Upper Extremity Assessment Upper Extremity Assessment: Defer to OT evaluation    Lower Extremity Assessment Lower Extremity Assessment: RLE deficits/detail;LLE deficits/detail RLE Deficits / Details: grossly 3-5 LLE Deficits / Details: grossly 2+/5    Cervical / Trunk Assessment Cervical / Trunk Assessment: Kyphotic  Communication   Communication: No difficulties  Cognition Arousal/Alertness: Awake/alert Behavior During Therapy: Flat affect Overall Cognitive Status: Impaired/Different from baseline Area of Impairment: Orientation;Following commands;Safety/judgement;Awareness;Problem solving;Memory                 Orientation Level: Disoriented to;Time;Situation;Place   Memory: Decreased short-term memory Following Commands: Follows one step commands with increased time Safety/Judgement: Decreased awareness of safety;Decreased awareness of deficits Awareness: Intellectual Problem Solving: Slow processing;Decreased initiation;Difficulty sequencing;Requires verbal cues;Requires tactile cues        General Comments      Exercises General Exercises - Lower Extremity Long Arc Quad: AROM;AAROM;Both;10 reps;Seated   Assessment/Plan    PT Assessment Patient needs continued PT services  PT Problem List Decreased strength;Decreased range of motion;Decreased activity tolerance;Decreased balance;Decreased mobility;Decreased knowledge of use of DME;Decreased safety awareness;Decreased cognition;Decreased knowledge of precautions;Cardiopulmonary status limiting activity;Obesity       PT Treatment Interventions DME instruction;Gait training;Stair training;Functional mobility training;Therapeutic activities;Therapeutic exercise;Balance training;Patient/family education;Cognitive remediation    PT Goals (Current goals can be found in the Care Plan section)  Acute Rehab PT Goals Patient Stated Goal: to get better PT Goal  Formulation: With patient Time For Goal Achievement: 08/10/17 Potential to Achieve Goals: Good    Frequency Min 3X/week   Barriers to discharge        Co-evaluation               AM-PAC PT "6 Clicks" Daily Activity  Outcome Measure Difficulty turning over in bed (including adjusting bedclothes, sheets and blankets)?: Total Difficulty moving from lying on back to sitting on the side of the bed? : Total Difficulty sitting down on and standing up from a chair with arms (e.g., wheelchair, bedside commode, etc,.)?: Total Help needed moving to and from a bed to chair (including a wheelchair)?: Total Help needed walking in hospital room?: Total Help needed climbing 3-5 steps with a railing? : Total 6 Click Score: 6    End of Session Equipment Utilized During Treatment: Gait belt;Oxygen Activity Tolerance: Patient limited by fatigue Patient left: in chair;with call bell/phone within reach;with family/visitor present Nurse Communication: Mobility status;Need for lift equipment Charlaine Dalton) PT Visit Diagnosis: Unsteadiness on feet (R26.81);Muscle weakness (generalized) (M62.81)    Time: 1020-1050 PT Time Calculation (min) (ACUTE ONLY): 30 min   Charges:   PT Evaluation $PT Eval Moderate Complexity: 1 Mod PT Treatments $Therapeutic Activity: 8-22 mins   PT G Codes:        [place] [phone] (pager)   Denice Paradise 07/27/2017, 1:19 PM

## 2017-07-27 NOTE — Progress Notes (Signed)
Nutrition Follow-up  DOCUMENTATION CODES:   Obesity unspecified  INTERVENTION:   D/C Vital High Protein  Jevity 1.2 @ 55 ml/hr (1320 ml/day) 30 ml Prostat TID Provides: 1884 kcal, 118 grams protein, and 1069 ml free water.  Total free water: 2069 ml  NUTRITION DIAGNOSIS:   Inadequate oral intake related to inability to eat as evidenced by NPO status. Ongoing.   GOAL:   Patient will meet greater than or equal to 90% of their needs Progressing.   MONITOR:   TF tolerance, Skin, Labs  REASON FOR ASSESSMENT:   Consult Enteral/tube feeding initiation and management  ASSESSMENT:   Pt with PMH of smoker since age 57, HTN, PVD was admitted 8/2 with SOB and AF w RVR. Pt developed respiratory failure and was intubated 8/3.   8/14 one-way extubation 8/15 Cortrak placed, TF protocol started Spoke with wife and pt.  Per MD plan for cardiac cath next week  250 ml free water every 6 hours  Medications reviewed and include: 10 mEq KCl x 5 Labs reviewed: Na 151 (H), K+ 3.2 (L), PO4 3.1  TF: Vital High Protein @ 40 ml/hr with 30 ml Prostat BID  Provides: 1160 kcal and 114 grams protein  Diet Order:  Diet NPO time specified  Skin:   (DTI.)  Last BM:  8/16 small  Height:   Ht Readings from Last 1 Encounters:  07/27/17 6\' 2"  (1.88 m)    Weight:   Wt Readings from Last 1 Encounters:  07/27/17 237 lb 3.4 oz (107.6 kg)    Ideal Body Weight:  86.3 kg  BMI:  Body mass index is 30.46 kg/m.  Estimated Nutritional Needs:   Kcal:  1800-2000  Protein:  110-125 grams  Fluid:  >2 L/day  EDUCATION NEEDS:   No education needs identified at this time  [place], [place], [place] Pager [phone] After Hours Pager

## 2017-07-27 NOTE — Progress Notes (Signed)
Patient ID: Brandon [name], male   DOB: [birth year], 71 y.o.   MRN: [id number]     Advanced Heart Failure Rounding Note   Subjective:    8/3 developed progressive respiratory distress => Bipap => intubation.  With sedation, became hypotensive and bradycardic, dopamine + phenylephrine started.  Developed AKI. Hyperkalemia treated with HCO3 gtt and Kayexalate.  Also with metabolic acidosis with elevated lactate (HCO3 gtt begun).  LFTs rose to the 1000s range.  Vancomycin/Zosyn initially started empirically (?PNA component) => stopped. Antibiotics restarted 8/8 => vanco/cefepime transitioned to Cipro.   CXR 07/23/17 with bibasilar airspace disease, stable.   Extubated 07/25/17.  Cor-Track placed 07/26/17  Awake and alert. Still having some confusion but much improved.   CVP 5-6.  Creatinine improved from 1.79 -> 1.46. K 3.2 this am.  Off norepinephrine.   ECHO (5/18): EF 55-60%, moderate AS, moderate AI, moderate MR.   TTE (8/18): EF 55-60%, moderate LVH, moderate AS mean 33, moderate AI, moderate-severe MR  Objective:   Weight Range: 237 lb 3.4 oz (107.6 kg) Body mass index is 30.46 kg/m.   Vital Signs:   Temp:  [97.4 F (36.3 C)-98.3 F (36.8 C)] 98.2 F (36.8 C) (08/16 0406) Pulse Rate:  [51-101] 86 (08/16 0700) Resp:  [16-32] 23 (08/16 0700) BP: (104-157)/(40-118) 135/68 (08/16 0700) SpO2:  [89 %-100 %] 100 % (08/16 0700) Weight:  [237 lb 3.4 oz (107.6 kg)] 237 lb 3.4 oz (107.6 kg) (08/16 0400) Last BM Date: 07/26/17  Weight change: Filed Weights   07/25/17 0300 07/26/17 0343 07/27/17 0400  Weight: 245 lb 6.4 oz (111.3 kg) 241 lb 10 oz (109.6 kg) 237 lb 3.4 oz (107.6 kg)   Intake/Output:   Intake/Output Summary (Last 24 hours) at 07/27/17 0723 Last data filed at 07/27/17 0700  Gross per 24 hour  Intake          3040.56 ml  Output             4305 ml  Net         -1264.44 ml    Physical Exam   CVP 5-6  General: Chronically ill appearing. NAD.  HEENT: Normal.  Brandon [name] via 02. + Cortrak. Neck: Supple. JVP 5-6. Carotids 2+ bilat; no bruits. No thyromegaly or nodule noted. Cor: PMI nondisplaced. Irregular. No M/G/R noted Lungs: Coarse throughout.  Abdomen: Soft, non-tender, non-distended, no HSM. No bruits or masses. +BS  Extremities: No cyanosis, clubbing, rash, R and LLE no edema.  Neuro: Alert to person. Follows commands.   Telemetry   Personally reviewed, NSR with frequent PVCs VS Afib 90s?  Labs    CBC  Recent Labs  07/26/17 0500 07/27/17 0423  WBC 20.8* 19.4*  NEUTROABS 18.6* 17.5*  HGB 8.0* 8.2*  HCT 26.8* 27.4*  MCV 100.4* 98.6  PLT 211 382   Basic Metabolic Panel  Recent Labs  07/26/17 0500 07/26/17 1715 07/27/17 0423  NA 148*  --  151*  K 3.2*  --  3.2*  CL 114*  --  117*  CO2 24  --  25  GLUCOSE 118*  --  130*  BUN 93*  --  66*  CREATININE 1.79*  --  1.46*  CALCIUM 8.5*  --  8.6*  MG 2.6* 2.4 2.7*  PHOS 4.1 3.7 3.1   Liver Function Tests  Recent Labs  07/24/17 1530  07/26/17 0500 07/27/17 0423  AST 55*  --   --   --   ALT 204*  --   --   --  ALKPHOS 41  --   --   --   BILITOT 0.8  --   --   --   PROT 5.5*  --   --   --   ALBUMIN 2.4*  < > 2.3* 2.4*  < > = values in this interval not displayed. No results for input(s): LIPASE, AMYLASE in the last 72 hours. Cardiac Enzymes No results for input(s): CKTOTAL, CKMB, CKMBINDEX, TROPONINI in the last 72 hours.  BNP: BNP (last 3 results)  Recent Labs  07/13/17 1341  BNP 408.4*    ProBNP (last 3 results) No results for input(s): PROBNP in the last 8760 hours.   D-Dimer No results for input(s): DDIMER in the last 72 hours. Hemoglobin A1C No results for input(s): HGBA1C in the last 72 hours. Fasting Lipid Panel No results for input(s): CHOL, HDL, LDLCALC, TRIG, CHOLHDL, LDLDIRECT in the last 72 hours. Thyroid Function Tests No results for input(s): TSH, T4TOTAL, T3FREE, THYROIDAB in the last 72 hours.  Invalid input(s): FREET3  Other  results:   Imaging    Dg Abd Portable 1v  Result Date: 07/26/2017 CLINICAL DATA:  Feeding tube placement. EXAM: PORTABLE ABDOMEN - 1 VIEW COMPARISON:  Radiograph of July 15, 2017. FINDINGS: The bowel gas pattern is normal. Distal tip of feeding tube is seen in expected position of distal stomach. IMPRESSION: No evidence of bowel obstruction or ileus. Distal tip of feeding tube is seen in expected position of distal stomach. Electronically Signed   By: Marijo Conception, M.D.   On: 07/26/2017 11:04     Medications:     Scheduled Medications: . amiodarone  200 mg Oral BID  . chlorhexidine gluconate (MEDLINE KIT)  15 mL Mouth Rinse BID  . Chlorhexidine Gluconate Cloth  6 each Topical Daily  . feeding supplement (PRO-STAT SUGAR FREE 64)  30 mL Per Tube BID  . feeding supplement (VITAL HIGH PROTEIN)  1,000 mL Per Tube Q24H  . free water  250 mL Per Tube Q6H  . furosemide  40 mg Intravenous BID  . insulin aspart  0-20 Units Subcutaneous Q4H  . mouth rinse  15 mL Mouth Rinse 10 times per day  . pantoprazole sodium  20 mg Per Tube Daily  . sodium chloride flush  10-40 mL Intracatheter Q12H  . sodium chloride flush  3 mL Intravenous Q12H    Infusions: . sodium chloride    . sodium chloride 10 mL/hr at 07/27/17 0700  . ciprofloxacin Stopped (07/27/17 0547)  . dexmedetomidine (PRECEDEX) IV infusion Stopped (07/26/17 1500)  . heparin 1,500 Units/hr (07/27/17 0700)  . norepinephrine (LEVOPHED) Adult infusion Stopped (07/26/17 1200)  . potassium chloride 10 mEq (07/27/17 0659)    PRN Medications: sodium chloride, fentaNYL (SUBLIMAZE) injection, glycopyrrolate, metoprolol tartrate, midazolam, nitroGLYCERIN, ondansetron (ZOFRAN) IV, sodium chloride flush, sodium chloride flush    Patient Profile   71 yo with valvular heart disease and smoking/suspected COPD, CVA with carotid stenting was admitted with dyspnea/respiratory distress.   Assessment/Plan   1. Acute hypoxemic respiratory  failure: There has been a component of pulmonary edema. - CVP 5-6 - On Antibiotics (consolidated to Cipro) for suspected PNA => trach aspirate with Enterobacter and Staph.   - He is on prednisone for ? component of COPD exacerbation.  - Successful extubation 07/25/17. Mental status improving.  2. Acute on chronic diastolic CHF: Baseline has at least moderate valvular disease (moderate AS, moderate AI, moderate-severe MR).  Suspect diastolic CHF triggered by poorly tolerated paroxysmal atrial fibrillation  in setting of valvular disease.  Admission thought to be triggered by diastolic CHF/pulmonary edema.   He developed AKI with lactic acidosis in the setting of hypotension with sedation.  - CVP 5-6. Stop IV lasix.  - I/O negative 1.2 L. Weight down another 4 lbs.     - Eventually needs L/RHC.  3. Shock: Probably mixed cardiogenic/vasodilatory.  Developed in the setting of sedation with Bipap then intubation as well as bradycardia with sedation.  - Off levophed. 4. AKI: With hyperkalemia (treated) in setting of shock. Suspect ATN.  Creatinine 1.9>1.7 >2.04 > 1.99 > 1.74-> 1.84 -> 1.98 -> 1.79 -> 1.46 - BUN and creatinine improved with IV lasix 40 mg BID yesterday.  5. Elevated LFTs:  - LFTs have improved at last check.   Recheck today.  6. Atrial fibrillation: Paroxysmal.  - He appears to be back in Afib with controlled rate today.  Will discuss rhythm vs rate control with MD. He has had > 10 g amio now. May need DCCV.  - Continue heparin drip.   7. Anemia: Recent fall in hgb to 8s range but has been stable here.  FOBT negative 8/6.  Fe deficient, got feraheme.   - Hgb 8.2 this am. No overt GI bleeding.   8. ID: Blx CX NGTD. Suspect PNA.  On vancomycin/cefepime initially, now ciprofloxacin.   - WBCs 24.9 -> 20.8 -> 19.4 9. COPD: Suspected.  Active smoker.  On prednisone to cover exacerbation. No change. 10. Valvular heart disease: Echo this admission with moderate AS, moderate AI, moderate to  severe MR.  May need reassessment with TEE.  May eventually need consideration of surgical valve repair/replacement with Maze given poorly tolerated afib. No change.  11. Hypernatremia:  - Trending back up. 151 today, encourage po intake.  12. Neuro: Altered mental status. Much more alert now.  ?Uremia + infection, ?component of ETOH withdrawal.   13. Limited Code.   - Successfully extubated 07/25/17.    Length of Stay: [address]  Annamaria Helling  07/27/2017, 7:23 AM  Advanced Heart Failure Team Pager [phone] (M-F; 7a - 4p)  Please contact [facility] for night-coverage after hours (4p -7a ) and weekends on [email or web address]  Patient seen with PA, agree with the above note.   Short runs of atrial fibrillation interspersed with NSR.  Will transition back to IV amiodarone gtt for today.   Creatinine coming down, CVP now 4-5.  Hold Lasix today.    Lungs still with coarse breath sounds, lots of sputum.  Continue antibiotics for PNA per CCM.   If creatinine and mental status continue to improve, anticipate cardiac cath next week.   Loralie Champagne 07/27/2017 7:57 AM

## 2017-07-27 NOTE — Progress Notes (Signed)
Daily Progress Note   Patient Name: Brandon [name]       Date: 07/27/2017 DOB: 1946-01-05  Age: 71 y.o. MRN#: [id number] Attending Physician: Thayer Headings, MD Primary Care Physician: Maurice Small, MD Admit Date: 07/13/2017  Reason for Consultation/Follow-up: Establishing goals of care  Subjective: Brandon [name] continues to be succeeding off vent. More alert today and confusion appears to be slowly improving.   Length of Stay: 13  Current Medications: Scheduled Meds:  . chlorhexidine gluconate (MEDLINE KIT)  15 mL Mouth Rinse BID  . Chlorhexidine Gluconate Cloth  6 each Topical Daily  . feeding supplement (PRO-STAT SUGAR FREE 64)  30 mL Per Tube TID  . free water  250 mL Per Tube Q6H  . insulin aspart  0-20 Units Subcutaneous Q4H  . mouth rinse  15 mL Mouth Rinse 10 times per day  . pantoprazole sodium  20 mg Per Tube Daily  . sodium chloride flush  10-40 mL Intracatheter Q12H  . sodium chloride flush  3 mL Intravenous Q12H    Continuous Infusions: . sodium chloride    . sodium chloride 10 mL/hr at 07/27/17 0700  . amiodarone 30 mg/hr (07/27/17 1300)  . ciprofloxacin Stopped (07/27/17 0547)  . dexmedetomidine (PRECEDEX) IV infusion Stopped (07/26/17 1500)  . feeding supplement (JEVITY 1.2 CAL) 55 mL/hr (07/27/17 1100)  . heparin 1,500 Units/hr (07/27/17 1300)    PRN Meds: sodium chloride, fentaNYL (SUBLIMAZE) injection, glycopyrrolate, metoprolol tartrate, midazolam, nitroGLYCERIN, ondansetron (ZOFRAN) IV, sodium chloride flush, sodium chloride flush  Physical Exam  Constitutional: He appears well-developed.  HENT:  Head: Normocephalic and atraumatic.  Cardiovascular: An irregularly irregular rhythm present. Bradycardia present.   Pulmonary/Chest: No accessory muscle  usage. No tachypnea. No respiratory distress. He has rhonchi. He has rales.  Abdominal: Soft. Normal appearance.  Neurological: He is alert. He is disoriented.  Nursing note and vitals reviewed.           Vital Signs: BP 124/69   Pulse (!) 125   Temp 97.9 F (36.6 C)   Resp 16   Ht _0  (1.88 m)   Wt 107.6 kg (237 lb 3.4 oz)   SpO2 98%   BMI 30.46 kg/m  SpO2: SpO2: 98 % O2 Device: O2 Device: Nasal Cannula O2 Flow Rate: O2 Flow Rate (L/min): 2 L/min  Intake/output summary:   Intake/Output Summary (Last 24 hours) at 07/27/17 1309 Last data filed at 07/27/17 1300  Gross per 24 hour  Intake          2960.18 ml  Output             3535 ml  Net          -574.82 ml   LBM: Last BM Date: 07/26/17 Baseline Weight: Weight: 109.8 kg (242 lb) Most recent weight: Weight: 107.6 kg (237 lb 3.4 oz)       Palliative Assessment/Data: 20%      Patient Active Problem List   Diagnosis Date Noted  . Agitation   . Pressure injury of skin 07/20/2017  . Ventilator dependence ([place])   . Goals of care, counseling/discussion   . Palliative care encounter   . Acute respiratory failure with hypoxemia ([place])   . Acute pulmonary edema ([facility])   . Acute diastolic heart failure ([place])   . Hypotensive episode   . Arrhythmia 07/13/2017  . Aortic stenosis, moderate 07/13/2017  . Anemia 07/13/2017  . Dyspnea 07/13/2017  . Respiratory distress 07/13/2017  . S/P left rotator cuff repair 05/18/2017  . Non-rheumatic mitral regurgitation   . HOH (hard of hearing) 06/02/2013  . Obesity (BMI 30-39.9) 06/02/2013  . Acute cholecystitis with chronic cholecystitis 06/01/2013  . Calculus of bile duct without mention of cholecystitis or obstruction 05/31/2013  . Nonspecific elevation of levels of transaminase or lactic acid dehydrogenase (LDH) 05/31/2013  . Nonspecific (abnormal) findings on radiological and other examination of biliary tract 05/31/2013  . TOBACCO ABUSE 02/22/2010  . DYSLIPIDEMIA  06/25/2009  . MITRAL INSUFFICIENCY 06/25/2009  . Essential hypertension 06/25/2009  . Aortic valve disorder 06/25/2009  . VALVULAR HEART DISEASE 06/25/2009  . Cardiovascular disease 06/25/2009  . CEREBROVASCULAR DISEASE 06/25/2009  . CEREBROVASCULAR ACCIDENT WITH RIGHT HEMIPARESIS 06/25/2009  . COPD exacerbation ([place]) 06/25/2009  . FOOT SURGERY, HX OF 06/25/2009  . POLYPECTOMY, HX OF 06/25/2009  . TONSILLECTOMY, HX OF 06/25/2009    Palliative Care Assessment & Plan   HPI: 71 y.o. male  with past medical history of CVA, HTN, HL, AS, MR, [name], current tobacco use (since 71 yo), remote hx colon polyps admitted on 07/13/2017 with chest pain, diaphoresis, elevated troponin. Hospital stay complicated by afib RVR and volume overload leading to BiPAP and ultimately ventilator 8/3. Has continued to have issues with afib/tachycardia and agitation and has not made much progress on ventilator. One way extubation 8/14 with continued supportive care and hopes for continued improvement.   Assessment: Brandon [name] continues to ask for water. He is more communicative although voice continues to be very weak and hoarse - SLP to begin following today along with PT/OT. Wife at bedside and very pleased with progress. Emotional support provided.   Recommendations/Plan:  One way extubation completed 07/25/17.   Dyspnea/pain: Fentanyl 25-100 mcg every 2 hours prn.   Agitation: Versed 0.5 mg every 4 hours prn.   Robinul 0.2 IV every 4 hours prn.   Code Status:  DNR  Prognosis:   Unable to determine  Discharge Planning:  To Be Determined  Care plan was discussed with Korey RN  Thank you for allowing the Palliative Medicine Team to assist in the care of this patient.   Total Time 7mn Prolonged Time Billed  no      Greater than 50%  of this time was spent counseling and coordinating care related to the above assessment and plan.  Vinie Sill, NP Palliative Medicine  Team Pager # [phone] (M-F 8a-5p) Team Phone # [phone] (Nights/Weekends)

## 2017-07-27 NOTE — Care Management Note (Signed)
Case Management Note  Patient Details  Name: Brandon [name] MRN: [id number] Date of Birth: 1946/04/08  Subjective/Objective:     Pt admitted with SOB - required intubation             Action/Plan:   PTA from home with wife - pt has recent rotator cuff surgery.  Pt remains intubated.  CM will continue to follow for discharge needs   Expected Discharge Date:                  Expected Discharge Plan:  Home/Self Care  In-House Referral:     Discharge planning Services  CM Consult  Post Acute Care Choice:    Choice offered to:     DME Arranged:    DME Agency:     HH Arranged:    HH Agency:     Status of Service:     If discussed at [employer] of Stay Meetings, dates discussed:    Additional Comments: 07/20/17 07/27/2017 Discussed in LOS 8/16 - pt remains appropriate for continued stay.  Pt was terminally extubated and rallied.  CIR is now recommended- CSW consulted for back up plan   07/19/17 Pt remains on the vent with wife at bedside.  Pt was completely independent prior to PTA.  CM will continue to follow for discharge needs Maryclare Labrador, RN 07/27/2017, 1:10 PM

## 2017-07-28 DIAGNOSIS — R29898 Other symptoms and signs involving the musculoskeletal system: Secondary | ICD-10-CM

## 2017-07-28 LAB — CBC WITH DIFFERENTIAL/PLATELET
BASOS ABS: 0 10*3/uL (ref 0.0–0.1)
Basophils Relative: 0 %
EOS PCT: 1 %
Eosinophils Absolute: 0.1 10*3/uL (ref 0.0–0.7)
HEMATOCRIT: 25.3 % — AB (ref 39.0–52.0)
HEMOGLOBIN: 7.6 g/dL — AB (ref 13.0–17.0)
LYMPHS ABS: 0.7 10*3/uL (ref 0.7–4.0)
LYMPHS PCT: 5 %
MCH: 29.9 pg (ref 26.0–34.0)
MCHC: 30 g/dL (ref 30.0–36.0)
MCV: 99.6 fL (ref 78.0–100.0)
Monocytes Absolute: 1.1 10*3/uL — ABNORMAL HIGH (ref 0.1–1.0)
Monocytes Relative: 7 %
NEUTROS ABS: 12.7 10*3/uL — AB (ref 1.7–7.7)
NEUTROS PCT: 87 %
PLATELETS: 211 10*3/uL (ref 150–400)
RBC: 2.54 MIL/uL — AB (ref 4.22–5.81)
RDW: 18.5 % — ABNORMAL HIGH (ref 11.5–15.5)
WBC: 14.6 10*3/uL — AB (ref 4.0–10.5)

## 2017-07-28 LAB — HEPARIN LEVEL (UNFRACTIONATED): Heparin Unfractionated: 0.28 IU/mL — ABNORMAL LOW (ref 0.30–0.70)

## 2017-07-28 LAB — GLUCOSE, CAPILLARY
GLUCOSE-CAPILLARY: 116 mg/dL — AB (ref 65–99)
GLUCOSE-CAPILLARY: 122 mg/dL — AB (ref 65–99)
GLUCOSE-CAPILLARY: 96 mg/dL (ref 65–99)
Glucose-Capillary: 120 mg/dL — ABNORMAL HIGH (ref 65–99)
Glucose-Capillary: 123 mg/dL — ABNORMAL HIGH (ref 65–99)
Glucose-Capillary: 136 mg/dL — ABNORMAL HIGH (ref 65–99)

## 2017-07-28 LAB — RENAL FUNCTION PANEL
ALBUMIN: 2.1 g/dL — AB (ref 3.5–5.0)
ANION GAP: 7 (ref 5–15)
BUN: 51 mg/dL — ABNORMAL HIGH (ref 6–20)
CALCIUM: 8.3 mg/dL — AB (ref 8.9–10.3)
CO2: 25 mmol/L (ref 22–32)
Chloride: 118 mmol/L — ABNORMAL HIGH (ref 101–111)
Creatinine, Ser: 1.2 mg/dL (ref 0.61–1.24)
GFR, EST NON AFRICAN AMERICAN: 59 mL/min — AB (ref >60)
GLUCOSE: 155 mg/dL — AB (ref 65–99)
PHOSPHORUS: 2.9 mg/dL (ref 2.5–4.6)
POTASSIUM: 3.3 mmol/L — AB (ref 3.5–5.1)
SODIUM: 150 mmol/L — AB (ref 135–145)

## 2017-07-28 LAB — ABO/RH: ABO/RH(D): A POS

## 2017-07-28 LAB — MAGNESIUM
Magnesium: 2.6 mg/dL — ABNORMAL HIGH (ref 1.7–2.4)
Magnesium: 2.4 mg/dL (ref 1.7–2.4)

## 2017-07-28 LAB — PREPARE RBC (CROSSMATCH)

## 2017-07-28 LAB — PHOSPHORUS: Phosphorus: 2.9 mg/dL (ref 2.5–4.6)

## 2017-07-28 MED ORDER — ACETAMINOPHEN 160 MG/5ML PO SOLN
650.0000 mg | Freq: Once | ORAL | Status: AC
  Administered 2017-07-28: 650 mg
  Filled 2017-07-28: qty 20.3

## 2017-07-28 MED ORDER — PANTOPRAZOLE SODIUM 40 MG PO PACK
40.0000 mg | PACK | Freq: Every day | ORAL | Status: DC
  Administered 2017-07-28 – 2017-08-07 (×11): 40 mg
  Filled 2017-07-28 (×9): qty 20

## 2017-07-28 MED ORDER — POTASSIUM CHLORIDE 20 MEQ/15ML (10%) PO SOLN
40.0000 meq | ORAL | Status: AC
  Administered 2017-07-28 (×2): 40 meq via ORAL
  Filled 2017-07-28 (×2): qty 30

## 2017-07-28 MED ORDER — FUROSEMIDE 10 MG/ML IJ SOLN
40.0000 mg | Freq: Once | INTRAMUSCULAR | Status: AC
  Administered 2017-07-28: 40 mg via INTRAVENOUS
  Filled 2017-07-28: qty 4

## 2017-07-28 MED ORDER — SODIUM CHLORIDE 0.9 % IV SOLN
Freq: Once | INTRAVENOUS | Status: AC
  Administered 2017-07-28: 18:00:00 via INTRAVENOUS

## 2017-07-28 MED ORDER — FAMOTIDINE IN NACL 20-0.9 MG/50ML-% IV SOLN
20.0000 mg | Freq: Once | INTRAVENOUS | Status: AC
  Administered 2017-07-28: 20 mg via INTRAVENOUS
  Filled 2017-07-28: qty 50

## 2017-07-28 NOTE — Progress Notes (Signed)
  Speech Language Pathology Treatment: Dysphagia  Patient Details Name: Brandon [name] MRN: [id number] DOB: 09-27-1946 Today's Date: 07/28/2017 Time: 6950-7225 SLP Time Calculation (min) (ACUTE ONLY): 19 min  Assessment / Plan / Recommendation Clinical Impression  Mild improvements relating to pharyngeal/larygneal swallow including improved saliva management and slightly stronger vocal quality although still with significant hoarseness. Airway protection for thinner consistencies continues to be problematic with immediate cough. Delayed throat clears/cough with applesauce using [place]ur to expectorate indicative of likely pharyngeal residue however pt may be able to tolerate thinner textures or safely compensate. Pt is ready for instrumental testing with FEES which will be today around 1330.  HPI        SLP Plan  New goals to be determined pending instrumental study       Recommendations  Diet recommendations: NPO Medication Administration: Via alternative means                Oral Care Recommendations: Oral care QID Follow up Recommendations:  (TBD) SLP Visit Diagnosis: Dysphagia, oropharyngeal phase (R13.12) Plan: New goals to be determined pending instrumental study       GO                Brandon [name] 07/28/2017, 10:30 AM  Orbie Pyo Colvin Caroli.[employer] [phone]

## 2017-07-28 NOTE — Progress Notes (Signed)
Patient ID: Brandon [name], male   DOB: September 01, 1946, 71 y.o.   MRN: [id number]     Advanced Heart Failure Rounding Note   Subjective:    8/3 developed progressive respiratory distress => Bipap => intubation.  With sedation, became hypotensive and bradycardic, dopamine + phenylephrine started.  Developed AKI. Hyperkalemia treated with HCO3 gtt and Kayexalate.  Also with metabolic acidosis with elevated lactate (HCO3 gtt begun).  LFTs rose to the 1000s range.  Vancomycin/Zosyn initially started empirically (?PNA component) => stopped. Antibiotics restarted 8/8 => vanco/cefepime transitioned to Cipro.   CXR 07/23/17 with bibasilar airspace disease, stable.   Extubated 07/25/17.  Cor-Track placed 07/26/17  Awake and alert. Working with speech for swallow study. Plan for FEES this afternoon. Slowly starting to speak.   CVP 7-8.  Creatinine continues to improve. Down to 1.2. K 3.3.   ECHO (5/18): EF 55-60%, moderate AS, moderate AI, moderate MR.   TTE (8/18): EF 55-60%, moderate LVH, moderate AS mean 33, moderate AI, moderate-severe MR  Objective:   Weight Range: 241 lb 13.5 oz (109.7 kg) Body mass index is 31.05 kg/m.   Vital Signs:   Temp:  [97.4 F (36.3 C)-98.5 F (36.9 C)] 97.8 F (36.6 C) (08/17 0819) Pulse Rate:  [66-131] 66 (08/17 0819) Resp:  [16-25] 18 (08/17 0819) BP: (82-148)/(55-83) 108/65 (08/17 0819) SpO2:  [95 %-100 %] 98 % (08/17 0819) FiO2 (%):  [2 %] 2 % (08/16 1200) Weight:  [241 lb 13.5 oz (109.7 kg)] 241 lb 13.5 oz (109.7 kg) (08/17 0500) Last BM Date: 07/27/17  Weight change: Filed Weights   07/26/17 0343 07/27/17 0400 07/28/17 0500  Weight: 241 lb 10 oz (109.6 kg) 237 lb 3.4 oz (107.6 kg) 241 lb 13.5 oz (109.7 kg)   Intake/Output:   Intake/Output Summary (Last 24 hours) at 07/28/17 0925 Last data filed at 07/27/17 2215  Gross per 24 hour  Intake           1953.4 ml  Output              860 ml  Net           1093.4 ml    Physical Exam   CVP  7-8  General: Chronically ill appearing.  No resp difficulty. HEENT: Normal. + Cortrak. Neck: Supple. JVP 7-8. Carotids 2+ bilat; no bruits. No thyromegaly or nodule noted. Cor: PMI nondisplaced. RRR, No M/G/R noted Lungs: CTAB, normal effort. Abdomen: Soft, non-tender, non-distended, no HSM. No bruits or masses. +BS  Extremities: No cyanosis, clubbing, rash, R and LLE no edema.  Neuro: Alert & orientedx3, cranial nerves grossly intact. moves all 4 extremities w/o difficulty. Affect pleasant    Telemetry   Personally reviewed, NSR with PVCs.   Labs    CBC  Recent Labs  07/27/17 0423 07/28/17 0613  WBC 19.4* 14.6*  NEUTROABS 17.5* 12.7*  HGB 8.2* 7.6*  HCT 27.4* 25.3*  MCV 98.6 99.6  PLT 211 832   Basic Metabolic Panel  Recent Labs  07/27/17 0423 07/28/17 0205 07/28/17 0613  NA 151*  --  150*  K 3.2*  --  3.3*  CL 117*  --  118*  CO2 25  --  25  GLUCOSE 130*  --  155*  BUN 66*  --  51*  CREATININE 1.46*  --  1.20  CALCIUM 8.6*  --  8.3*  MG 2.7* 2.6* 2.4  PHOS 3.1 2.9 2.9   Liver Function Tests  Recent Labs  07/27/17 0418  07/27/17 0423 07/28/17 0613  AST 39  --   --   ALT 109*  --   --   ALKPHOS 46  --   --   BILITOT 1.5*  --   --   PROT 5.8*  --   --   ALBUMIN 2.4* 2.4* 2.1*   No results for input(s): LIPASE, AMYLASE in the last 72 hours. Cardiac Enzymes No results for input(s): CKTOTAL, CKMB, CKMBINDEX, TROPONINI in the last 72 hours.  BNP: BNP (last 3 results)  Recent Labs  07/13/17 1341  BNP 408.4*    ProBNP (last 3 results) No results for input(s): PROBNP in the last 8760 hours.   D-Dimer No results for input(s): DDIMER in the last 72 hours. Hemoglobin A1C No results for input(s): HGBA1C in the last 72 hours. Fasting Lipid Panel No results for input(s): CHOL, HDL, LDLCALC, TRIG, CHOLHDL, LDLDIRECT in the last 72 hours. Thyroid Function Tests No results for input(s): TSH, T4TOTAL, T3FREE, THYROIDAB in the last 72  hours.  Invalid input(s): FREET3  Other results:   Imaging    No results found.   Medications:     Scheduled Medications: . chlorhexidine gluconate (MEDLINE KIT)  15 mL Mouth Rinse BID  . Chlorhexidine Gluconate Cloth  6 each Topical Daily  . feeding supplement (PRO-STAT SUGAR FREE 64)  30 mL Per Tube TID  . free water  250 mL Per Tube Q6H  . insulin aspart  0-20 Units Subcutaneous Q4H  . mouth rinse  15 mL Mouth Rinse 10 times per day  . pantoprazole sodium  20 mg Per Tube Daily  . sodium chloride flush  10-40 mL Intracatheter Q12H  . sodium chloride flush  3 mL Intravenous Q12H    Infusions: . sodium chloride    . sodium chloride 10 mL/hr at 07/27/17 0700  . amiodarone 30 mg/hr (07/27/17 2042)  . ciprofloxacin Stopped (07/28/17 0603)  . dexmedetomidine (PRECEDEX) IV infusion Stopped (07/26/17 1500)  . feeding supplement (JEVITY 1.2 CAL) 55 mL/hr (07/27/17 1100)  . heparin 1,500 Units/hr (07/27/17 2041)    PRN Medications: sodium chloride, fentaNYL (SUBLIMAZE) injection, glycopyrrolate, metoprolol tartrate, midazolam, nitroGLYCERIN, ondansetron (ZOFRAN) IV, sodium chloride flush, sodium chloride flush    Patient Profile   71 yo with valvular heart disease and smoking/suspected COPD, CVA with carotid stenting was admitted with dyspnea/respiratory distress.   Assessment/Plan   1. Acute hypoxemic respiratory failure: There has been a component of pulmonary edema. - CVP 7-8. Likely needs at least po lasix.  - On Antibiotics (consolidated to Cipro) for suspected PNA => trach aspirate with Enterobacter and Staph.   - He is on prednisone for ? component of COPD exacerbation.  - Successful extubation 07/25/17. Mental status improving.  2. Acute on chronic diastolic CHF: Baseline has at least moderate valvular disease (moderate AS, moderate AI, moderate-severe MR).  Suspect diastolic CHF triggered by poorly tolerated paroxysmal atrial fibrillation in setting of valvular  disease.  Admission thought to be triggered by diastolic CHF/pulmonary edema.   He developed AKI with lactic acidosis in the setting of hypotension with sedation.  - CVP 7-8. Off IV lasix.  Will give one dose of 40 mg lasix with blood.  - I/O + 1L. Weight back up 4 lbs.      - Eventually needs L/RHC.  3. Shock: Probably mixed cardiogenic/vasodilatory.  Developed in the setting of sedation with Bipap then intubation as well as bradycardia with sedation.  - Off levophed. LFTs and Creatinine improved.  4.  AKI: With hyperkalemia (treated) in setting of shock. Suspect ATN.   - Creatinine continues to improve. Now WNL.  5. Elevated LFTs:  - LFTs improved.  6. Atrial fibrillation: Paroxysmal.  - Having frequent ectopy but p waves present.  Suspect can transition back to po amiodarone today. Will discuss rhythm vs rate control with MD. He has had > 10 g amio now. May need DCCV.  - Continue heparin drip.   7. Anemia: Recent fall in hgb to 8s range but has been stable here.  FOBT negative 8/6.  Fe deficient, got feraheme.   - Hgb 7.6 this am. No overt bleeding.  Type and Screen and give 1 UPRBCs.  8. ID: Blx CX NGTD. Suspect PNA.  On vancomycin/cefepime initially, now ciprofloxacin.   - WBCs 24.9 -> 20.8 -> 19.4 -> 14.6 9. COPD: Suspected.  Active smoker.  On prednisone to cover exacerbation. No change.  10. Valvular heart disease: Echo this admission with moderate AS, moderate AI, moderate to severe MR.  May need reassessment with TEE.  May eventually need consideration of surgical valve repair/replacement with Maze given poorly tolerated afib. No change.   11. Hypernatremia:  - 150 today. Encouraged po intake.  12. Neuro: Altered mental status. Improving daily.  ?Uremia + infection, ?component of ETOH withdrawal.   13. Hypokalemia - Will supp with po K 40 meq x 2 now with Cor-trak.  14. Limited Code.   - Successfully extubated 07/25/17.     Length of Stay: [place], [place]   07/28/2017, 9:25 AM  Advanced Heart Failure Team Pager [phone] (M-F; 7a - 4p)  Please contact [facility] for night-coverage after hours (4p -7a ) and weekends on [email or web address]  Patient seen and examined with the above-signed Advanced Practice Provider and/or Housestaff. I personally reviewed laboratory data, imaging studies and relevant notes. I independently examined the patient and formulated the important aspects of the plan. I have edited the note to reflect any of my changes or salient points. I have personally discussed the plan with the patient and/or family.  More alert today. Following commands but still very weak. Passed bedside swallow and now moving to FEES. Hgb low. Will transfuse. Family nervous about transfusion reaction but I had talk with them and they now agree that benefits likely outweigh the risks. Still with hypernatremia. Encouraged free H2O intake. Supp K as well. Maintaining NSR. Will need aggressive rehab. SNF discussed with family and SW at bedside.   Glori Bickers, MD  7:34 PM

## 2017-07-28 NOTE — Clinical Social Work Note (Signed)
Clinical Social Work Assessment  Patient Details  Name: Brandon [name] MRN: [id number] Date of Birth: 10-05-1946  Date of referral:  07/28/17               Reason for consult:  Facility Placement                Permission sought to share information with:  Facility Sport and exercise psychologist, Family Supports Permission granted to share information::  Yes, Verbal Permission Granted  Name::     Midwife::  SNF  Relationship::  wife  Contact Information:     Housing/Transportation Living arrangements for the past 2 months:  Single Family Home Source of Information:  Spouse Patient Interpreter Needed:  None Criminal Activity/Legal Involvement Pertinent to Current Situation/Hospitalization:  No - Comment as needed Significant Relationships:  Spouse Lives with:  Spouse Do you feel safe going back to the place where you live?  No Need for family participation in patient care:  Yes (Comment) (help with decision making at this time)  Care giving concerns:  Pt lives at home with wife- pt now with severe increase in impairment after ventilation and acute illness.  Wife does not think she can manage pt needs at home in this state.   Social Worker assessment / plan:  CSW spoke with pt and pt wife concerning PT recommendations for SNF.  Explained SNF and SNF referral process.    Employment status:  Retired Nurse, adult PT Recommendations:  [place], [place] / Referral to community resources:  [place]  Patient/Family's Response to care:  Pt and wife agreeable to short term rehab and hopeful for placement near home in [place] if possible  Patient/Family's Understanding of and Emotional Response to Diagnosis, Current Treatment, and Prognosis:  Pt and wife understand severity of pt condition this hospital stay and are grateful for his recent recovery- willing to do whatever it takes to help patient  continue to improve.  Emotional Assessment Appearance:  Appears stated age Attitude/Demeanor/Rapport:  Lethargic Affect (typically observed):  Appropriate Orientation:  Oriented to Situation, Oriented to  Time, Oriented to Self Alcohol / Substance use:  Not Applicable Psych involvement (Current and /or in the community):  No (Comment)  Discharge Needs  Concerns to be addressed:  Care Coordination Readmission within the last 30 days:  No Current discharge risk:  Physical Impairment Barriers to Discharge:  Continued Medical Work up   Jorge Ny, LCSW 07/28/2017, 10:32 AM

## 2017-07-28 NOTE — Evaluation (Signed)
Occupational Therapy Evaluation Patient Details Name: Brandon [name] MRN: [id number] DOB: 06/11/1946 Today's Date: 07/28/2017    History of Present Illness 71 y.o. heavy smoker admitted with acute hypoxic respiratory failure along with atrial fibrillation with rapid ventricular response. Patient was found to be in volume overload. With confusion occurring after admission and hypotension on Precedex in the setting of acute on chronic renal failure patient was intubated on 8/3. Intubated 8/3-8/14.     Clinical Impression   Pt admitted with above. He demonstrates the below listed deficits and will benefit from continued OT to maximize safety and independence with BADLs.  Pt presents to OT with generalized weakness, decreased activity tolerance, impaired balance, impaired cognition.   He requires max - total A for ADLs, and max A +2 for functional mobility.  Wife is very supportive.  He currently fatigues rapidly with activity, but hopefully with more time, he will increase his activity tolerance to a level appropriate for CIR.  Will follow.        Follow Up Recommendations  CIR;Supervision/Assistance - 24 hour    Equipment Recommendations  3 in 1 bedside commode    Recommendations for Other Services Rehab consult     Precautions / Restrictions Precautions Precautions: Fall Restrictions Weight Bearing Restrictions: No      Mobility Bed Mobility Overal bed mobility: Needs Assistance Bed Mobility: Supine to Sit     Supine to sit: Max assist;+2 for physical assistance;Mod assist     General bed mobility comments: Pt needed assist to move LEs off bed and for elevation of trunk. Used pad to scoot pt to EOB.    Transfers Overall transfer level: Needs assistance Equipment used:  Charlaine Dalton) Transfers: Sit to/from Stand Sit to Stand: Max assist;+2 physical assistance;From elevated surface;Mod assist         General transfer comment: Pt needed mod to max assist to power up and  pulled up on Stedy.  took incr time to come to full stand needing constant cuing but did achieve standing for 1 1/2 min while OT cleaning pt as he has BM.  Pt needed a sitting rest break on the [place].  Noted that pt was fatiguing after sitting on Stedy with left lateral lean and DOE 4/4 with HR 128 bpm.  Stood pt once more and cleaned the rest of bottom with pt standing fully upright 15 seconds and then pt needed controlled descent into recliner.        Balance Overall balance assessment: Needs assistance;History of Falls Sitting-balance support: Bilateral upper extremity supported;Feet supported Sitting balance-Leahy Scale: Zero Sitting balance - Comments: Pt needed max assist to mod assist at times to sit EOB with pt leaning all directions but worse to left and posterior.  Toward end of sitting did sit with min guard assist for brief time. Postural control: Left lateral lean;Posterior lean Standing balance support: Bilateral upper extremity supported;During functional activity Standing balance-Leahy Scale: Poor Standing balance comment: Required Stedy and +2 mod to max assist to stand.                            ADL either performed or assessed with clinical judgement   ADL Overall ADL's : Needs assistance/impaired Eating/Feeding: NPO   Grooming: Wash/dry hands;Oral care;Wash/dry face;Brushing hair;Maximal assistance;Sitting   Upper Body Bathing: Maximal assistance;Sitting   Lower Body Bathing: Total assistance;Bed level   Upper Body Dressing : Total assistance;Sitting   Lower Body Dressing: Total assistance;Bed level  Toilet Transfer: Maximal assistance;+2 for physical assistance;Stand-pivot (stedy )   Toileting- Clothing Manipulation and Hygiene: Sit to/from stand;Total assistance Toileting - Clothing Manipulation Details (indicate cue type and reason): Pt incontinent of stool, and was assisted with peri care      Functional mobility during ADLs: Maximal  assistance;+2 for physical assistance (stedy )       Vision         Perception     Praxis      Pertinent Vitals/Pain Pain Assessment: No/denies pain Faces Pain Scale: Hurts a little bit Pain Intervention(s): Monitored during session     Hand Dominance Right   Extremity/Trunk Assessment Upper Extremity Assessment Upper Extremity Assessment: Generalized weakness;LUE deficits/detail;RUE deficits/detail RUE Deficits / Details: Rt UE grossly 3+/5 - 4-/5;  LUE Deficits / Details: Grossly 3-/5.  Pt with recent partial rotator cuff repair    Lower Extremity Assessment Lower Extremity Assessment: Defer to PT evaluation   Cervical / Trunk Assessment Cervical / Trunk Assessment: Kyphotic   Communication Communication Communication: Expressive difficulties (low volume )   Cognition Arousal/Alertness: Awake/alert Behavior During Therapy: Flat affect Overall Cognitive Status: Impaired/Different from baseline Area of Impairment: Orientation;Following commands;Safety/judgement;Awareness;Problem solving;Memory                 Orientation Level: Disoriented to;Time;Situation;Place   Memory: Decreased short-term memory Following Commands: Follows one step commands with increased time Safety/Judgement: Decreased awareness of safety;Decreased awareness of deficits Awareness: Intellectual Problem Solving: Slow processing;Decreased initiation;Difficulty sequencing;Requires verbal cues;Requires tactile cues     General Comments  HR 128 and DOE 4/4 with activity     Exercises     Shoulder Instructions      Home Living Family/patient expects to be discharged to:: Private residence Living Arrangements: Spouse/significant other Available Help at Discharge: Family;Friend(s);Available 24 hours/day Type of Home: House Home Access: Stairs to enter [employer] of Steps: 1 + 1 Entrance Stairs-Rails: None Home Layout: One level     Bathroom Shower/Tub: Community education officer: Standard Bathroom Accessibility: Yes   Home Equipment: None          Prior Functioning/Environment Level of Independence: Independent        Comments: Pt was fully independent with ADLs including yard work.  He recently underwent Lt partial repair RTC,per wife (June 2018), and was still undergoing therapy - Dr Tamera Punt         OT Problem List: Decreased strength;Decreased range of motion;Impaired balance (sitting and/or standing);Decreased activity tolerance;Decreased cognition;Decreased safety awareness;Decreased knowledge of use of DME or AE;Cardiopulmonary status limiting activity;Obesity      OT Treatment/Interventions: Self-care/ADL training;Therapeutic exercise;Energy conservation;DME and/or AE instruction;Manual therapy;Therapeutic activities;Cognitive remediation/compensation;Patient/family education;Balance training    OT Goals(Current goals can be found in the care plan section) Acute Rehab OT Goals Patient Stated Goal: to get better OT Goal Formulation: With patient/family Time For Goal Achievement: 08/11/17 Potential to Achieve Goals: Good ADL Goals Pt Will Perform Grooming: with set-up;with supervision;sitting Pt Will Perform Upper Body Bathing: with min assist;sitting Pt Will Perform Upper Body Dressing: with mod assist;sitting Pt Will Transfer to Toilet: with mod assist;stand pivot transfer;bedside commode Additional ADL Goal #1: Pt will actively participate in 20 mins therapeutic activity with no more than 3 rest breaks and DOE no > 3/4   OT Frequency: Min 2X/week   Barriers to D/C:            Co-evaluation PT/OT/SLP Co-Evaluation/Treatment: Yes Reason for Co-Treatment: Complexity of the patient's impairments (multi-system involvement);To address functional/ADL transfers;For  patient/therapist safety PT goals addressed during session: Mobility/safety with mobility OT goals addressed during session: Strengthening/ROM;ADL's  and self-care      AM-PAC PT "6 Clicks" Daily Activity     Outcome Measure Help from another person eating meals?: Total Help from another person taking care of personal grooming?: A Lot Help from another person toileting, which includes using toliet, bedpan, or urinal?: Total Help from another person bathing (including washing, rinsing, drying)?: A Lot Help from another person to put on and taking off regular upper body clothing?: Total Help from another person to put on and taking off regular lower body clothing?: Total 6 Click Score: 8   End of Session Equipment Utilized During Treatment: Oxygen;Gait belt (stedy ) Nurse Communication: Mobility status;Need for lift equipment  Activity Tolerance: Patient limited by fatigue Patient left: in chair;with call bell/phone within reach;with family/visitor present  OT Visit Diagnosis: Muscle weakness (generalized) (M62.81)                Time: 5573-2202 OT Time Calculation (min): 32 min Charges:  OT General Charges $OT Visit: 1 Procedure OT Evaluation $OT Eval Moderate Complexity: 1 Procedure G-Codes:     Lucille Passy, OTR/L [phone]   Lucille Passy M 07/28/2017, 12:59 PM

## 2017-07-28 NOTE — Progress Notes (Signed)
ANTICOAGULATION CONSULT NOTE - Follow Up Consult  Pharmacy Consult for Heparin  Indication: atrial fibrillation  Allergies  Allergen Reactions  . Bee Venom Anaphylaxis and Swelling    Patient Measurements: Height: 6\' 2"  (188 cm) Weight: 241 lb 13.5 oz (109.7 kg) IBW/kg (Calculated) : 82.2   Vital Signs: Temp: 97.8 F (36.6 C) (08/17 0819) Temp Source: Oral (08/17 0819) BP: 108/65 (08/17 0819) Pulse Rate: 66 (08/17 0819)  Labs:  Recent Labs  07/26/17 0500  07/26/17 1920 07/27/17 0423 07/27/17 0424 07/28/17 0613  HGB 8.0*  --   --  8.2*  --  7.6*  HCT 26.8*  --   --  27.4*  --  25.3*  PLT 211  --   --  211  --  211  HEPARINUNFRC 0.26*  < > 0.32  --  0.32 0.28*  CREATININE 1.79*  --   --  1.46*  --  1.20  < > = values in this interval not displayed.  Estimated Creatinine Clearance: 74.4 mL/min (by C-G formula based on SCr of 1.2 mg/dL).   Assessment: 71 y/o M continues on heparin drip for afib. Heparin level yesterday came back at 0.32, within goal range. Level this morning came back slightly below goal at 0.28. Hgb down a little today to 7.6, no overt bleeding noted.   Goal of Therapy:  Heparin level 0.3-0.7 units/ml Monitor platelets by anticoagulation protocol: Yes   Plan:  Continue heparin at 1500 units / hr Follow up AM heparin level, CBC F/u ability to start oral anticoagulation eventually.  Uvaldo Rising, BCPS  Clinical Pharmacist Pager [phone]  07/28/2017 11:13 AM

## 2017-07-28 NOTE — Progress Notes (Signed)
Physical Therapy Treatment Patient Details Name: Brandon [name] MRN: [id number] DOB: 07-08-1946 Today's Date: 07/28/2017    History of Present Illness 71 y.o. heavy smoker admitted with acute hypoxic respiratory failure along with atrial fibrillation with rapid ventricular response. Patient was found to be in volume overload. With confusion occurring after admission and hypotension on Precedex in the setting of acute on chronic renal failure patient was intubated on 8/3. Intubated 8/3-8/14.      PT Comments    Pt admitted with above diagnosis. Pt currently with functional limitations due to balance and endurance deficits. Pt was able to stand with STedy with +2 mod to max assist standing 1 1/2 min.  Continues to fatigue quickly but did do better than yesterday. Will continue acute PT.   Pt will benefit from skilled PT to increase their independence and safety with mobility to allow discharge to the venue listed below.     Follow Up Recommendations  CIR;Supervision/Assistance - 24 hour     Equipment Recommendations  Rolling walker with 5" wheels;3in1 (PT);Wheelchair (measurements PT);Wheelchair cushion (measurements PT)    Recommendations for Other Services Rehab consult     Precautions / Restrictions Precautions Precautions: Fall Restrictions Weight Bearing Restrictions: No    Mobility  Bed Mobility Overal bed mobility: Needs Assistance Bed Mobility: Supine to Sit     Supine to sit: Max assist;+2 for physical assistance;Mod assist     General bed mobility comments: Pt needed assist to move LEs off bed and for elevation of trunk. Used pad to scoot pt to EOB.    Transfers Overall transfer level: Needs assistance Equipment used:  Charlaine Dalton) Transfers: Sit to/from Stand Sit to Stand: Max assist;+2 physical assistance;From elevated surface;Mod assist         General transfer comment: Pt needed mod to max assist to power up and pulled up on Stedy.  took incr time to come to  full stand needing constant cuing but did achieve standing for 1 1/2 min while OT cleaning pt as he has BM.  Pt needed a sitting rest break on the [place].  Noted that pt was fatiguing after sitting on Stedy with left lateral lean and DOE 4/4 with HR 128 bpm.  Stood pt once more and cleaned the rest of bottom with pt standing fully upright 15 seconds and then pt needed controlled descent into recliner.      Ambulation/Gait                 Stairs            Wheelchair Mobility    Modified Rankin (Stroke Patients Only)       Balance Overall balance assessment: Needs assistance;History of Falls Sitting-balance support: Bilateral upper extremity supported;Feet supported Sitting balance-Leahy Scale: Zero Sitting balance - Comments: Pt needed max assist to mod assist at times to sit EOB with pt leaning all directions but worse to left and posterior.  Toward end of sitting did sit with min guard assist for brief time. Postural control: Left lateral lean;Posterior lean Standing balance support: Bilateral upper extremity supported;During functional activity Standing balance-Leahy Scale: Poor Standing balance comment: Required Stedy and +2 mod to max assist to stand.                             Cognition Arousal/Alertness: Awake/alert Behavior During Therapy: Flat affect Overall Cognitive Status: Impaired/Different from baseline Area of Impairment: Orientation;Following commands;Safety/judgement;Awareness;Problem solving;Memory  Orientation Level: Disoriented to;Time;Situation;Place   Memory: Decreased short-term memory Following Commands: Follows one step commands with increased time Safety/Judgement: Decreased awareness of safety;Decreased awareness of deficits Awareness: Intellectual Problem Solving: Slow processing;Decreased initiation;Difficulty sequencing;Requires verbal cues;Requires tactile cues        Exercises      General  Comments        Pertinent Vitals/Pain Pain Assessment: No/denies pain Faces Pain Scale: Hurts a little bit Pain Intervention(s): Monitored during session    Home Living                      Prior Function            PT Goals (current goals can now be found in the care plan section) Progress towards PT goals: Progressing toward goals    Frequency    Min 3X/week      PT Plan Current plan remains appropriate    Co-evaluation PT/OT/SLP Co-Evaluation/Treatment: Yes Reason for Co-Treatment: Complexity of the patient's impairments (multi-system involvement) PT goals addressed during session: Mobility/safety with mobility        AM-PAC PT "6 Clicks" Daily Activity  Outcome Measure  Difficulty turning over in bed (including adjusting bedclothes, sheets and blankets)?: Unable Difficulty moving from lying on back to sitting on the side of the bed? : Unable Difficulty sitting down on and standing up from a chair with arms (e.g., wheelchair, bedside commode, etc,.)?: A Lot Help needed moving to and from a bed to chair (including a wheelchair)?: Total Help needed walking in hospital room?: Total Help needed climbing 3-5 steps with a railing? : Total 6 Click Score: 7    End of Session Equipment Utilized During Treatment: Gait belt;Oxygen Activity Tolerance: Patient limited by fatigue Patient left: in chair;with call bell/phone within reach;with family/visitor present Nurse Communication: Mobility status;Need for lift equipment (Stedy vs. lift pad Maxi sky) PT Visit Diagnosis: Unsteadiness on feet (R26.81);Muscle weakness (generalized) (M62.81)     Time: 2505-3976 PT Time Calculation (min) (ACUTE ONLY): 32 min  Charges:  $Therapeutic Activity: 8-22 mins                    G Codes:       Magnolia Mattila,PT Acute Rehabilitation [phone]   Denice Paradise 07/28/2017, 12:40 PM

## 2017-07-28 NOTE — Procedures (Signed)
Objective Swallowing Evaluation: Type of Study: FEES-Fiberoptic Endoscopic Evaluation of Swallow  Patient Details  Name: Brandon [name] MRN: [id number] Date of Birth: May 02, 1946  Today's Date: 07/28/2017 Time: SLP Start Time (ACUTE ONLY): 1359-SLP Stop Time (ACUTE ONLY): 1423 SLP Time Calculation (min) (ACUTE ONLY): 24 min  Past Medical History:  Past Medical History:  Diagnosis Date  . Anemia 07/13/2017  . Anxiety   . Aortic insufficiency   . Aortic stenosis, moderate 07/13/2017  . Arthritis    back   . Carotid stenosis    Right carotid stent (widely patent) 40 - 59% left plaque 11/13  . Depression   . Dyslipidemia   . GERD (gastroesophageal reflux disease)   . Heart murmur   . Hemiplegia affecting unspecified side, late effect of cerebrovascular disease    resolved- from L side   . Hypertension   . Jaundice    resolved following ERCP & Cholecystectomy  . Mitral valve insufficiency and aortic valve insufficiency   . Pre-diabetes    per spouse  . Sleep apnea    does not wear CPAP  . Sleep concern    resulted in surgery- after + sleep test. Pt. doesn't have a problem any longer.   . Stroke ([place]) 03/11/2003   stent placed on the 31, 3, 2004, L side   . Wears glasses   . Wears hearing aid in both ears   . Wears partial dentures    Past Surgical History:  Past Surgical History:  Procedure Laterality Date  . BACK SURGERY     lumbar back  . CHOLECYSTECTOMY    . ERCP N/A 05/31/2013   Procedure: ENDOSCOPIC RETROGRADE CHOLANGIOPANCREATOGRAPHY (ERCP);  Surgeon: Ladene Artist, MD;  Location: [facility];  Service: Endoscopy;  Laterality: N/A;  . FOOT SURGERY     right  . LAPAROSCOPIC CHOLECYSTECTOMY SINGLE PORT N/A 06/01/2013   Procedure: LAPAROSCOPIC CHOLECYSTECTOMY SINGLE PORT;  Surgeon: Adin Hector, MD;  Location: [facility];  Service: General;  Laterality: N/A;  . POLYPECTOMY    . SHOULDER ARTHROSCOPY WITH ROTATOR CUFF REPAIR AND SUBACROMIAL DECOMPRESSION Left 05/18/2017   . SHOULDER ARTHROSCOPY WITH ROTATOR CUFF REPAIR AND SUBACROMIAL DECOMPRESSION Left 05/18/2017   Procedure: SHOULDER ARTHROSCOPY WITH ROTATOR CUFF REPAIR AND SUBACROMIAL DECOMPRESSION;  Surgeon: Tania Ade, MD;  Location: [place];  Service: Orthopedics;  Laterality: Left;  LEFT SHOULDER ARTHROSCOPY WITH ROTATOR CUFF REPAIR AND SUBACROMIAL DECOMPRESSION  . TEE WITHOUT CARDIOVERSION N/A 05/09/2017   Procedure: TRANSESOPHAGEAL ECHOCARDIOGRAM (TEE);  Surgeon: Skeet Latch, MD;  Location: [facility];  Service: Cardiovascular;  Laterality: N/A;  . TONSILLECTOMY     HPI: 71 yo with valvular heart disease and smoking/suspected COPD, CVA with carotid stenting was admitted with dyspnea/respiratory distress 8/2. Found to be in atrial fibrillation with rapid ventricular response and volume overload. Placed on Bipap but required intubated 8/3-8/14.   No Data Recorded   Assessment / Plan / Recommendation  CHL IP CLINICAL IMPRESSIONS 07/28/2017  Clinical Impression Pt demonstates reversible dysphagia due to extended intubation, general weakness and deconditioning. Inspection of laryngeal structures revealed posterior commisure hypertrophy, significant incomplete glottal closure during vocalization, weak laryngeal elevation, epiglottic inversion and pharyngeal contraction. Diffuse pharyngeal residuals and minimal success generating second swallows, weak coughs and throat clears. No absolute aspiration or penetration observed however po's attempting to fall between anterior arytenoid space. Pt should continue NPO status with oral care. Educated pt and spouse re: exercises to strengthen vocal cord adduction, cough. May benefit from respiratory muscle strength training (RMT).  SLP Visit Diagnosis Dysphagia, pharyngeal phase (R13.13)  Attention and concentration deficit following --  Frontal lobe and executive function deficit following --  Impact on safety and function Severe aspiration risk       CHL IP TREATMENT RECOMMENDATION 07/28/2017  Treatment Recommendations Therapy as outlined in treatment plan below     Prognosis 07/28/2017  Prognosis for Safe Diet Advancement Good  Barriers to Reach Goals Severity of deficits  Barriers/Prognosis Comment --    CHL IP DIET RECOMMENDATION 07/28/2017  SLP Diet Recommendations NPO  Liquid Administration via --  Medication Administration Via alternative means  Compensations --  Postural Changes --      CHL IP OTHER RECOMMENDATIONS 07/28/2017  Recommended Consults --  Oral Care Recommendations Oral care QID  Other Recommendations --      CHL IP FOLLOW UP RECOMMENDATIONS 07/28/2017  Follow up Recommendations (No Data)      CHL IP FREQUENCY AND DURATION 07/28/2017  Speech Therapy Frequency (ACUTE ONLY) min 2x/week  Treatment Duration 2 weeks           CHL IP ORAL PHASE 07/28/2017  Oral Phase Impaired  Oral - Pudding Teaspoon --  Oral - Pudding Cup --  Oral - Honey Teaspoon --  Oral - Honey Cup Delayed oral transit  Oral - Nectar Teaspoon --  Oral - Nectar Cup Delayed oral transit  Oral - Nectar Straw --  Oral - Thin Teaspoon --  Oral - Thin Cup --  Oral - Thin Straw --  Oral - Puree --  Oral - Mech Soft --  Oral - Regular --  Oral - Multi-Consistency --  Oral - Pill --  Oral Phase - Comment --    CHL IP PHARYNGEAL PHASE 07/28/2017  Pharyngeal Phase Impaired  Pharyngeal- Pudding Teaspoon --  Pharyngeal --  Pharyngeal- Pudding Cup --  Pharyngeal --  Pharyngeal- Honey Teaspoon --  Pharyngeal --  Pharyngeal- Honey Cup Pharyngeal residue - valleculae;Pharyngeal residue - pyriform;Lateral channel residue;Reduced pharyngeal peristalsis;Reduced epiglottic inversion;Reduced laryngeal elevation;Reduced tongue base retraction  Pharyngeal --  Pharyngeal- Nectar Teaspoon --  Pharyngeal --  Pharyngeal- Nectar Cup Pharyngeal residue - valleculae;Pharyngeal residue - pyriform;Lateral channel residue;Reduced pharyngeal  peristalsis;Reduced epiglottic inversion;Reduced laryngeal elevation;Reduced tongue base retraction  Pharyngeal --  Pharyngeal- Nectar Straw --  Pharyngeal --  Pharyngeal- Thin Teaspoon --  Pharyngeal --  Pharyngeal- Thin Cup --  Pharyngeal --  Pharyngeal- Thin Straw --  Pharyngeal --  Pharyngeal- Puree --  Pharyngeal --  Pharyngeal- Mechanical Soft --  Pharyngeal --  Pharyngeal- Regular --  Pharyngeal --  Pharyngeal- Multi-consistency --  Pharyngeal --  Pharyngeal- Pill --  Pharyngeal --  Pharyngeal Comment --     CHL IP CERVICAL ESOPHAGEAL PHASE 07/28/2017  Cervical Esophageal Phase WFL  Pudding Teaspoon --  Pudding Cup --  Honey Teaspoon --  Honey Cup --  Nectar Teaspoon --  Nectar Cup --  Nectar Straw --  Thin Teaspoon --  Thin Cup --  Thin Straw --  Puree --  Mechanical Soft --  Regular --  Multi-consistency --  Pill --  Cervical Esophageal Comment --    No flowsheet data found.  Houston Siren 07/28/2017, 3:02 PM   Orbie Pyo Colvin Caroli.Ed [employer] [phone]

## 2017-07-28 NOTE — Progress Notes (Signed)
Pharmacy Antibiotic Note  Brandon [name] is a 71 y.o. male currently on vancomycin and cefepime for HCAP now to switch to cipro. Renal function improving with Cr down to 1.2 and CrCl ~ 75 ml/min.  Plan: 1) Cipro 400 mg IV q12. 2) Consider d/c soon?  Height: 6\' 2"  (188 cm) Weight: 241 lb 13.5 oz (109.7 kg) IBW/kg (Calculated) : 82.2  Temp (24hrs), Avg:97.9 F (36.6 C), Min:97.4 F (36.3 C), Max:98.5 F (36.9 C)   Recent Labs Lab 07/24/17 0201 07/25/17 0427 07/26/17 0500 07/27/17 0423 07/28/17 0613  WBC 20.3* 24.9* 20.8* 19.4* 14.6*  CREATININE 1.84* 1.98* 1.79* 1.46* 1.20    Estimated Creatinine Clearance: 74.4 mL/min (by C-G formula based on SCr of 1.2 mg/dL).    Allergies  Allergen Reactions  . Bee Venom Anaphylaxis and Swelling    Antimicrobials this admission: 8/9 Cefepime >>8/12 8/10 Vancomycin >>8/12 8/12 Cipro >>  Microbiology results: 8/2 MRSA: negative 8/3 BCx: NGTD 8/8 TA: few S. Aureus, few Enterobacter - both S to cipro  Thank you for allowing pharmacy to be a part of this patient's care.  Uvaldo Rising, BCPS  Clinical Pharmacist Pager [phone]  07/28/2017 11:12 AM

## 2017-07-28 NOTE — Progress Notes (Signed)
Occupational Therapy Progress Note  Spoke with Dr Bettina Gavia office to establish precautions/activity for Lt UE (pt with recent partial Lt RCR).  Precautions were posted over his bed and were discussed with wife, pt, and nsg.  Pt participated in UE exercises with Lt UE, but fatigued quickly.    07/28/17 1500  OT Visit Information  Last OT Received On 07/28/17  Assistance Needed +2  History of Present Illness 71 y.o. heavy smoker admitted with acute hypoxic respiratory failure along with atrial fibrillation with rapid ventricular response. Patient was found to be in volume overload. With confusion occurring after admission and hypotension on Precedex in the setting of acute on chronic renal failure patient was intubated on 8/3. Intubated 8/3-8/14.    Precautions  Precautions Shoulder  Precaution Comments Per Dr Tamera Punt (pt s/p Lt RCR early June) NWB Lt UE except to assist with standing and able to use RW; No pulling or pushing with Lt UE;  unrestricted PROM/AROM is okay.  No strengthening    Pain Assessment  Pain Assessment No/denies pain  Cognition  Arousal/Alertness Awake/alert  Behavior During Therapy Flat affect  Overall Cognitive Status Impaired/Different from baseline  Area of Impairment Orientation;Following commands;Safety/judgement;Awareness;Problem solving;Memory  Orientation Level Disoriented to;Time;Situation;Place  Memory Decreased short-term memory  Following Commands Follows one step commands with increased time  Safety/Judgement Decreased awareness of safety;Decreased awareness of deficits  Problem Solving Slow processing;Decreased initiation;Difficulty sequencing;Requires verbal cues;Requires tactile cues  General Comments  General comments (skin integrity, edema, etc.) Called and spoke with Caitlyn at Dr Bettina Gavia office, who spoke with Andee Poles, [place]. to determine precautions/activity for Lt UE.  These were updated in chart, as well as posted over his bed.  Discussed these  with pt and wife as well as nursing     Exercises  Exercises General Upper Extremity  General Exercises - Upper Extremity  Shoulder Flexion AAROM;Left;20 reps;Supine  Shoulder ABduction AAROM;Left;Supine;Other (comment);20 reps (scaption )  OT - End of Session  Equipment Utilized During Treatment Oxygen  Activity Tolerance Patient limited by fatigue  Patient left in bed;with family/visitor present  Nurse Communication Other (comment) (shoulder precautions )  OT Assessment/Plan  OT Plan Discharge plan remains appropriate  OT Visit Diagnosis Muscle weakness (generalized) (M62.81)  OT Frequency (ACUTE ONLY) Min 2X/week  Recommendations for Other Services Rehab consult  Follow Up Recommendations CIR;Supervision/Assistance - 24 hour  OT Equipment 3 in 1 bedside commode  AM-PAC OT "6 Clicks" Daily Activity Outcome Measure  Help from another person eating meals? 1  Help from another person taking care of personal grooming? 2  Help from another person toileting, which includes using toliet, bedpan, or urinal? 1  Help from another person bathing (including washing, rinsing, drying)? 2  Help from another person to put on and taking off regular upper body clothing? 1  Help from another person to put on and taking off regular lower body clothing? 1  6 Click Score 8  ADL G Code Conversion CM  OT Goal Progression  Progress towards OT goals Progressing toward goals  OT Time Calculation  OT Start Time (ACUTE ONLY) 1431  OT Stop Time (ACUTE ONLY) 1440  OT Time Calculation (min) 9 min  OT General Charges  $OT Visit 1 Procedure  OT Treatments  $Therapeutic Exercise 8-22 mins  [facility], OTR/L [phone]

## 2017-07-28 NOTE — Progress Notes (Signed)
Talked to CCM team regarding potassium level of 3.3." will be addressed "

## 2017-07-28 NOTE — Progress Notes (Signed)
Palliative Medicine RN Note: Visit to check on patient and his wife, who is at bedside. I did not stay long, as ST is setting up for a FEES. Wife is relaxed and says that he has had a good day. The patient acknowledges me, but he makes no effort to talk to me, instead looking at his wife every time I ask a question. Plan for PMT to f/u Monday unless an acute need arises this weekend.  Marjie Skiff Allicia Culley, RN, BSN, [facility] 07/28/2017 2:15 PM Cell [phone] 8:00-4:00 Monday-Friday Office [phone]

## 2017-07-28 NOTE — NC FL2 (Signed)
[place] LEVEL OF CARE SCREENING TOOL     IDENTIFICATION  Patient Name: Brandon [name] Birthdate: 1946-01-07 Sex: male Admission Date (Current Location): 07/13/2017  [facility] and [place] Number:  Herbalist and Address:  [facility] [place] [place] [address] [place], [place] [address]      Provider Number: [id number]  Attending Physician Name and Address:  Nahser, Wonda Cheng, MD  Relative Name and Phone Number:       Current Level of Care: Hospital Recommended Level of Care: [place] Prior Approval Number:    Date Approved/Denied:   PASRR Number: [id number]  Discharge Plan: SNF    Current Diagnoses: Patient Active Problem List   Diagnosis Date Noted  . Agitation   . Pressure injury of skin 07/20/2017  . Ventilator dependence ([place])   . Goals of care, counseling/discussion   . Palliative care encounter   . Acute respiratory failure with hypoxemia ([place])   . Acute pulmonary edema (HCC)   . Acute diastolic heart failure ([place])   . Hypotensive episode   . Arrhythmia 07/13/2017  . Aortic stenosis, moderate 07/13/2017  . Anemia 07/13/2017  . Dyspnea 07/13/2017  . Respiratory distress 07/13/2017  . S/P left rotator cuff repair 05/18/2017  . Non-rheumatic mitral regurgitation   . HOH (hard of hearing) 06/02/2013  . Obesity (BMI 30-39.9) 06/02/2013  . Acute cholecystitis with chronic cholecystitis 06/01/2013  . Calculus of bile duct without mention of cholecystitis or obstruction 05/31/2013  . Nonspecific elevation of levels of transaminase or lactic acid dehydrogenase (LDH) 05/31/2013  . Nonspecific (abnormal) findings on radiological and other examination of biliary tract 05/31/2013  . TOBACCO ABUSE 02/22/2010  . DYSLIPIDEMIA 06/25/2009  . MITRAL INSUFFICIENCY 06/25/2009  . Essential hypertension 06/25/2009  . Aortic valve disorder 06/25/2009  . VALVULAR HEART DISEASE 06/25/2009  . Cardiovascular disease  06/25/2009  . CEREBROVASCULAR DISEASE 06/25/2009  . CEREBROVASCULAR ACCIDENT WITH RIGHT HEMIPARESIS 06/25/2009  . COPD exacerbation ([place]) 06/25/2009  . FOOT SURGERY, HX OF 06/25/2009  . POLYPECTOMY, HX OF 06/25/2009  . TONSILLECTOMY, HX OF 06/25/2009    Orientation RESPIRATION BLADDER Height & Weight     Self, Time, Situation  O2 (3L ) Incontinent, Indwelling catheter Weight: 241 lb 13.5 oz (109.7 kg) Height:  '6\' 2"'  (188 cm)  BEHAVIORAL SYMPTOMS/MOOD NEUROLOGICAL BOWEL NUTRITION STATUS      Continent Diet (see DC summary)  AMBULATORY STATUS COMMUNICATION OF NEEDS Skin   Extensive Assist Verbally Normal                       Personal Care Assistance Level of Assistance  Bathing, Dressing Bathing Assistance: Maximum assistance   Dressing Assistance: Maximum assistance     Functional Limitations Info             SPECIAL CARE FACTORS FREQUENCY  PT (By licensed PT), OT (By licensed OT)     PT Frequency: 5/wk OT Frequency: 5/wk            Contractures      Additional Factors Info  Code Status, Allergies, Insulin Sliding Scale Code Status Info: DNR Allergies Info: Bee Venom   Insulin Sliding Scale Info: 6/day Isolation Precautions Info: none     Current Medications (07/28/2017):  This is the current hospital active medication list Current Facility-Administered Medications  Medication Dose Route Frequency Provider Last Rate Last Dose  . 0.9 %  sodium chloride infusion  250 mL Intravenous PRN Barrett,  Evelene Croon, PA-C      . 0.9 %  sodium chloride infusion   Intravenous Continuous Barrett, Rhonda G, PA-C 10 mL/hr at 07/27/17 0700    . amiodarone (NEXTERONE PREMIX) 360-4.14 MG/200ML-% (1.8 mg/mL) IV infusion  30 mg/hr Intravenous Continuous Larey Dresser, MD 16.7 mL/hr at 07/27/17 2042 30 mg/hr at 07/27/17 2042  . chlorhexidine gluconate (MEDLINE KIT) (PERIDEX) 0.12 % solution 15 mL  15 mL Mouth Rinse BID Nahser, Wonda Cheng, MD   15 mL at 07/28/17 0931  .  Chlorhexidine Gluconate Cloth 2 % PADS 6 each  6 each Topical Daily Nahser, Wonda Cheng, MD   6 each at 07/28/17 1000  . ciprofloxacin (CIPRO) IVPB 400 mg  400 mg Intravenous Q12H Otilio Miu, [facility]   Stopped at 07/28/17 1517  . dexmedetomidine (PRECEDEX) 400 mcg in sodium chloride 0.9 % 100 mL (4 mcg/mL) infusion  0.4-1.2 mcg/kg/hr Intravenous Titrated Javier Glazier, MD   Stopped at 07/26/17 1500  . feeding supplement (JEVITY 1.2 CAL) liquid 1,000 mL  1,000 mL Per Tube Continuous Javier Glazier, MD 55 mL/hr at 07/27/17 1100 55 mL/hr at 07/27/17 1100  . feeding supplement (PRO-STAT SUGAR FREE 64) liquid 30 mL  30 mL Per Tube TID Javier Glazier, MD   30 mL at 07/28/17 0930  . fentaNYL (SUBLIMAZE) injection 25-100 mcg  25-100 mcg Intravenous O1Y PRN Pershing Proud, NP      . free water 250 mL  250 mL Per Tube Q6H Javier Glazier, MD   250 mL at 07/28/17 0600  . glycopyrrolate (ROBINUL) injection 0.2 mg  0.2 mg Intravenous W7P PRN Pershing Proud, NP      . heparin ADULT infusion 100 units/mL (25000 units/215m sodium chloride 0.45%)  1,500 Units/hr Intravenous Continuous Nahser, PWonda Cheng MD 15 mL/hr at 07/27/17 2041 1,500 Units/hr at 07/27/17 2041  . insulin aspart (novoLOG) injection 0-20 Units  0-20 Units Subcutaneous Q4H BCollene Gobble MD   3 Units at 07/28/17 0930  . MEDLINE mouth rinse  15 mL Mouth Rinse 10 times per day Nahser, PWonda Cheng MD   15 mL at 07/28/17 0932  . metoprolol tartrate (LOPRESSOR) injection 2.5 mg  2.5 mg Intravenous Q2H PRN Barrett, Rhonda G, PA-C   2.5 mg at 07/23/17 0853  . midazolam (VERSED) injection 0.5 mg  0.5 mg Intravenous QX1GPRN PPershing Proud NP      . nitroGLYCERIN (NITROSTAT) SL tablet 0.4 mg  0.4 mg Sublingual Q5 Min x 3 PRN Barrett, Rhonda G, PA-C      . ondansetron (ZOFRAN) injection 4 mg  4 mg Intravenous Q6H PRN Barrett, Rhonda G, PA-C   4 mg at 07/20/17 1158  . pantoprazole sodium (PROTONIX) 40 mg/20 mL oral suspension 40 mg  40 mg  Per Tube Daily TShirley Friar PA-C   40 mg at 07/28/17 0932  . potassium chloride 20 MEQ/15ML (10%) solution 40 mEq  40 mEq Oral Q4H TShirley Friar PA-C   40 mEq at 07/28/17 0935  . sodium chloride flush (NS) 0.9 % injection 10-40 mL  10-40 mL Intracatheter Q12H Nahser, PWonda Cheng MD   10 mL at 07/28/17 0931  . sodium chloride flush (NS) 0.9 % injection 10-40 mL  10-40 mL Intracatheter PRN Nahser, PWonda Cheng MD   10 mL at 07/27/17 2215  . sodium chloride flush (NS) 0.9 % injection 3 mL  3 mL Intravenous Q12H Barrett, REvelene Croon PA-C   3  mL at 07/27/17 1105  . sodium chloride flush (NS) 0.9 % injection 3 mL  3 mL Intravenous PRN Barrett, Evelene Croon, PA-C         Discharge Medications: Please see discharge summary for a list of discharge medications.  Relevant Imaging Results:  Relevant Lab Results:   Additional Information SS#: [id number]  Jorge Ny, LCSW

## 2017-07-29 LAB — GLUCOSE, CAPILLARY
GLUCOSE-CAPILLARY: 126 mg/dL — AB (ref 65–99)
GLUCOSE-CAPILLARY: 131 mg/dL — AB (ref 65–99)
Glucose-Capillary: 115 mg/dL — ABNORMAL HIGH (ref 65–99)
Glucose-Capillary: 125 mg/dL — ABNORMAL HIGH (ref 65–99)
Glucose-Capillary: 128 mg/dL — ABNORMAL HIGH (ref 65–99)

## 2017-07-29 LAB — TYPE AND SCREEN
ABO/RH(D): A POS
Antibody Screen: NEGATIVE
Unit division: 0

## 2017-07-29 LAB — CBC WITH DIFFERENTIAL/PLATELET
BASOS PCT: 0 %
Basophils Absolute: 0 10*3/uL (ref 0.0–0.1)
EOS ABS: 0.1 10*3/uL (ref 0.0–0.7)
Eosinophils Relative: 1 %
HCT: 28.9 % — ABNORMAL LOW (ref 39.0–52.0)
HEMOGLOBIN: 8.6 g/dL — AB (ref 13.0–17.0)
Lymphocytes Relative: 5 %
Lymphs Abs: 0.8 10*3/uL (ref 0.7–4.0)
MCH: 29 pg (ref 26.0–34.0)
MCHC: 29.8 g/dL — ABNORMAL LOW (ref 30.0–36.0)
MCV: 97.3 fL (ref 78.0–100.0)
Monocytes Absolute: 1.2 10*3/uL — ABNORMAL HIGH (ref 0.1–1.0)
Monocytes Relative: 8 %
NEUTROS PCT: 86 %
Neutro Abs: 12.3 10*3/uL — ABNORMAL HIGH (ref 1.7–7.7)
Platelets: 211 10*3/uL (ref 150–400)
RBC: 2.97 MIL/uL — AB (ref 4.22–5.81)
RDW: 18.7 % — ABNORMAL HIGH (ref 11.5–15.5)
WBC: 14.5 10*3/uL — AB (ref 4.0–10.5)

## 2017-07-29 LAB — BPAM RBC
Blood Product Expiration Date: 201809122359
ISSUE DATE / TIME: 201808171827
UNIT TYPE AND RH: 6200

## 2017-07-29 LAB — RENAL FUNCTION PANEL
Albumin: 2.3 g/dL — ABNORMAL LOW (ref 3.5–5.0)
Anion gap: 9 (ref 5–15)
BUN: 42 mg/dL — ABNORMAL HIGH (ref 6–20)
CALCIUM: 8.4 mg/dL — AB (ref 8.9–10.3)
CO2: 25 mmol/L (ref 22–32)
CREATININE: 1.19 mg/dL (ref 0.61–1.24)
Chloride: 117 mmol/L — ABNORMAL HIGH (ref 101–111)
GFR, EST NON AFRICAN AMERICAN: 60 mL/min — AB (ref >60)
Glucose, Bld: 145 mg/dL — ABNORMAL HIGH (ref 65–99)
Phosphorus: 3.1 mg/dL (ref 2.5–4.6)
Potassium: 3.7 mmol/L (ref 3.5–5.1)
SODIUM: 151 mmol/L — AB (ref 135–145)

## 2017-07-29 LAB — MAGNESIUM: MAGNESIUM: 2.3 mg/dL (ref 1.7–2.4)

## 2017-07-29 LAB — HEPARIN LEVEL (UNFRACTIONATED): Heparin Unfractionated: 0.28 IU/mL — ABNORMAL LOW (ref 0.30–0.70)

## 2017-07-29 MED ORDER — AMIODARONE HCL 200 MG PO TABS: 200.0000 mg | ORAL_TABLET | Freq: Two times a day (BID) | ORAL | Status: DC

## 2017-07-29 MED ORDER — FREE WATER
300.0000 mL | Freq: Four times a day (QID) | Status: DC
  Administered 2017-07-29 – 2017-08-06 (×31): 300 mL

## 2017-07-29 MED ORDER — AMIODARONE HCL 200 MG PO TABS
200.0000 mg | ORAL_TABLET | Freq: Two times a day (BID) | ORAL | Status: DC
  Administered 2017-07-29 – 2017-08-07 (×19): 200 mg
  Filled 2017-07-29 (×19): qty 1

## 2017-07-29 MED ORDER — POTASSIUM CHLORIDE 20 MEQ/15ML (10%) PO SOLN
40.0000 meq | Freq: Every day | ORAL | Status: DC
  Administered 2017-07-29 – 2017-08-07 (×10): 40 meq via ORAL
  Filled 2017-07-29 (×13): qty 30

## 2017-07-29 NOTE — Progress Notes (Signed)
ANTICOAGULATION CONSULT NOTE - Follow Up Consult  Pharmacy Consult for Heparin  Indication: atrial fibrillation  Allergies  Allergen Reactions  . Bee Venom Anaphylaxis and Swelling    Patient Measurements: Height: 6\' 2"  (188 cm) Weight: 232 lb 12.9 oz (105.6 kg) IBW/kg (Calculated) : 82.2   Vital Signs: Temp: 99 F (37.2 C) (08/18 0746) Temp Source: Axillary (08/18 0746) BP: 140/62 (08/18 0800) Pulse Rate: 74 (08/18 0800)  Labs:  Recent Labs  07/27/17 0423 07/27/17 0424 07/28/17 0613 07/29/17 0328  HGB 8.2*  --  7.6* 8.6*  HCT 27.4*  --  25.3* 28.9*  PLT 211  --  211 211  HEPARINUNFRC  --  0.32 0.28* 0.28*  CREATININE 1.46*  --  1.20 1.19    Estimated Creatinine Clearance: 73.8 mL/min (by C-G formula based on SCr of 1.19 mg/dL).  . sodium chloride    . sodium chloride 10 mL/hr at 07/29/17 0800  . amiodarone 30 mg/hr (07/29/17 0800)  . ciprofloxacin 400 mg (07/29/17 0520)  . dexmedetomidine (PRECEDEX) IV infusion Stopped (07/26/17 1500)  . feeding supplement (JEVITY 1.2 CAL) 1,000 mL (07/29/17 0800)  . heparin 1,500 Units/hr (07/29/17 0800)     Assessment: 71 y/o M continues on heparin drip for afib. Level this morning came back slightly below goal at 0.28. Hgb back up a bit today to 8.6 s/p PRBCs yesterday, no overt bleeding noted.   Goal of Therapy:  Heparin level 0.3-0.7 units/ml Monitor platelets by anticoagulation protocol: Yes   Plan:  Continue heparin at 1500 units / hr - given low Hgb and need for PRBCs will not increase heparin. Follow up AM heparin level, CBC F/u ability to start oral anticoagulation eventually.  Uvaldo Rising, BCPS  Clinical Pharmacist Pager [phone]  07/29/2017 10:55 AM

## 2017-07-29 NOTE — Progress Notes (Addendum)
Patient ID: Brandon [name], male   DOB: [birth year], 71 y.o.   MRN: [id number]     Advanced Heart Failure Rounding Note   Subjective:    8/3 developed progressive respiratory distress => Bipap => intubation.  With sedation, became hypotensive and bradycardic, dopamine + phenylephrine started.  Developed AKI. Hyperkalemia treated with HCO3 gtt and Kayexalate.  Also with metabolic acidosis with elevated lactate (HCO3 gtt begun).  LFTs rose to the 1000s range.   Antimicrobials this admission: 8/9 Cefepime >>8/12 8/10 Vancomycin >>8/12 8/12 Cipro >>  Microbiology results: 8/2 MRSA: negative 8/3 BCx: NGTD 8/8 TA: few S. Aureus, few Enterobacter - both S to cipro  CXR 07/23/17 with bibasilar airspace disease, stable.   Extubated 07/25/17.  Cor-Track placed 07/26/17  More awake. Following commands. Was able to stand briefly yesterday. Failed FEES. Remains NPO with cor-trak in. Got 1u RBCs last night.   ECHO (5/18): EF 55-60%, moderate AS, moderate AI, moderate MR.   TTE (8/18): EF 55-60%, moderate LVH, moderate AS mean 33, moderate AI, moderate-severe MR  Objective:   Weight Range: 105.6 kg (232 lb 12.9 oz) Body mass index is 29.89 kg/m.   Vital Signs:   Temp:  [97.7 F (36.5 C)-99 F (37.2 C)] 99 F (37.2 C) (08/18 0746) Pulse Rate:  [52-97] 75 (08/18 1100) Resp:  [16-30] 22 (08/18 1100) BP: (124-173)/(51-93) 128/53 (08/18 1100) SpO2:  [91 %-100 %] 100 % (08/18 1100) Weight:  [105.6 kg (232 lb 12.9 oz)] 105.6 kg (232 lb 12.9 oz) (08/18 0500) Last BM Date: 07/27/17  Weight change: Filed Weights   07/27/17 0400 07/28/17 0500 07/29/17 0500  Weight: 107.6 kg (237 lb 3.4 oz) 109.7 kg (241 lb 13.5 oz) 105.6 kg (232 lb 12.9 oz)   Intake/Output:   Intake/Output Summary (Last 24 hours) at 07/29/17 1130 Last data filed at 07/29/17 1000  Gross per 24 hour  Intake           2955.2 ml  Output             2600 ml  Net            355.2 ml    Physical Exam   CVP  5  General: Chronically ill appearing.  No resp difficulty. Follows commands HEENT: Normal. + Cortrak. Anicteric Neck: supple JVP flat. carotids ok  Cor: PMI nondisplaced. RRR frequent ectopy. 2/6 AS murmur Lungs: Coarse no wheeze  Abdomen: Soft NT/ND good BS Extremities: no cyanosis, clubbing, rash, edema Neuro: alert & oriented , cranial nerves grossly intact. moves all 4 extremities w/o difficulty. Affect pleasant  Telemetry   Personally reviewed, NSR 70s with PACs  Labs    CBC  Recent Labs  07/28/17 0613 07/29/17 0328  WBC 14.6* 14.5*  NEUTROABS 12.7* 12.3*  HGB 7.6* 8.6*  HCT 25.3* 28.9*  MCV 99.6 97.3  PLT 211 063   Basic Metabolic Panel  Recent Labs  07/28/17 0613 07/29/17 0328  NA 150* 151*  K 3.3* 3.7  CL 118* 117*  CO2 25 25  GLUCOSE 155* 145*  BUN 51* 42*  CREATININE 1.20 1.19  CALCIUM 8.3* 8.4*  MG 2.4 2.3  PHOS 2.9 3.1   Liver Function Tests  Recent Labs  07/27/17 0418  07/28/17 0613 07/29/17 0328  AST 39  --   --   --   ALT 109*  --   --   --   ALKPHOS 46  --   --   --   BILITOT 1.5*  --   --   --  PROT 5.8*  --   --   --   ALBUMIN 2.4*  < > 2.1* 2.3*  < > = values in this interval not displayed. No results for input(s): LIPASE, AMYLASE in the last 72 hours. Cardiac Enzymes No results for input(s): CKTOTAL, CKMB, CKMBINDEX, TROPONINI in the last 72 hours.  BNP: BNP (last 3 results)  Recent Labs  07/13/17 1341  BNP 408.4*    ProBNP (last 3 results) No results for input(s): PROBNP in the last 8760 hours.   D-Dimer No results for input(s): DDIMER in the last 72 hours. Hemoglobin A1C No results for input(s): HGBA1C in the last 72 hours. Fasting Lipid Panel No results for input(s): CHOL, HDL, LDLCALC, TRIG, CHOLHDL, LDLDIRECT in the last 72 hours. Thyroid Function Tests No results for input(s): TSH, T4TOTAL, T3FREE, THYROIDAB in the last 72 hours.  Invalid input(s): FREET3  Other results:   Imaging    No results  found.   Medications:     Scheduled Medications: . chlorhexidine gluconate (MEDLINE KIT)  15 mL Mouth Rinse BID  . Chlorhexidine Gluconate Cloth  6 each Topical Daily  . feeding supplement (PRO-STAT SUGAR FREE 64)  30 mL Per Tube TID  . free water  250 mL Per Tube Q6H  . insulin aspart  0-20 Units Subcutaneous Q4H  . mouth rinse  15 mL Mouth Rinse 10 times per day  . pantoprazole sodium  40 mg Per Tube Daily  . sodium chloride flush  10-40 mL Intracatheter Q12H  . sodium chloride flush  3 mL Intravenous Q12H    Infusions: . sodium chloride    . sodium chloride 10 mL/hr at 07/29/17 0800  . amiodarone 30 mg/hr (07/29/17 1109)  . ciprofloxacin 400 mg (07/29/17 0520)  . dexmedetomidine (PRECEDEX) IV infusion Stopped (07/26/17 1500)  . feeding supplement (JEVITY 1.2 CAL) 1,000 mL (07/29/17 0800)  . heparin 1,500 Units/hr (07/29/17 0800)    PRN Medications: sodium chloride, fentaNYL (SUBLIMAZE) injection, glycopyrrolate, metoprolol tartrate, midazolam, nitroGLYCERIN, ondansetron (ZOFRAN) IV, sodium chloride flush, sodium chloride flush    Patient Profile   71 yo with valvular heart disease and smoking/suspected COPD, CVA with carotid stenting was admitted with dyspnea/respiratory distress.   Assessment/Plan   1. Acute hypoxemic respiratory failure: Probable mix of pulmonary edema, PNA and COPD flare - Extubated 07/25/17 - CVP 5 - On Antibiotics (consolidated to Cipro) for suspected PNA => trach aspirate with Enterobacter and Staph.   - He is on prednisone for ? component of COPD exacerbation.  - Will stop abx after today (10 day course) - Discussed with PharmD.  - No lasix yet with low CVP and hypernatremia  2. Acute on chronic diastolic CHF: Baseline has at least moderate valvular disease (moderate AS, moderate AI, moderate-severe MR).  Suspect diastolic CHF triggered by poorly tolerated paroxysmal atrial fibrillation in setting of valvular disease.  Admission thought to be  triggered by diastolic CHF/pulmonary edema.   He developed AKI with lactic acidosis in the setting of hypotension with sedation.  - CVP 5  - Weight down. Continue to hold lasix (got 1 dose with RBCs yesterday)  - Will increase Free H2O to 300 q6 with hypernatremia  - Consider L/RHC per Dr. Aundra Dubin  3. Shock: Probably mixed cardiogenic/vasodilatory.  Developed in the setting of sedation with Bipap then intubation as well as bradycardia with sedation.  - Off levophed. LFTs and Creatinine improved.  4. AKI: With hyperkalemia (treated) in setting of shock. Suspect ATN.   - Creatinine  now normalized  5. Elevated LFTs:  - LFTs improved.  6. Atrial fibrillation: Paroxysmal.  - Remains in NSR with frequent PACs on IV amio. Will switch to po. Continue heparin.  7. Anemia: - hgb up to 8.6 with 1u RBCs on 8/17 8. ID: Blx CX NGTD. Suspect PNA.  On vancomycin/cefepime initially, now ciprofloxacin.   - Will stop today after 10 day course 9. COPD: Suspected.  Active smoker.  On prednisone to cover exacerbation. No change.  10. Valvular heart disease: Echo this admission with moderate AS, moderate AI, moderate to severe MR.  May need reassessment with TEE.  May eventually need consideration of surgical valve repair/replacement with Maze given poorly tolerated afib. No change.   11. Hypernatremia:  - Continues to climb. - Increase free H2O 12. Neuro: - improving 13. Hypokalemia - Continue to supp  14. Limited Code.   - Successfully extubated 07/25/17.     Length of Stay: 15  Glori Bickers, MD  07/29/2017, 11:30 AM  Advanced Heart Failure Team Pager [phone] (M-F; [place])  Please contact [facility] for night-coverage after hours (4p -7a ) and weekends on [email or web address]

## 2017-07-30 ENCOUNTER — Inpatient Hospital Stay (HOSPITAL_COMMUNITY): Payer: Medicare HMO

## 2017-07-30 DIAGNOSIS — R5383 Other fatigue: Secondary | ICD-10-CM

## 2017-07-30 LAB — POCT I-STAT 3, ART BLOOD GAS (G3+)
ACID-BASE EXCESS: 1 mmol/L (ref 0.0–2.0)
Bicarbonate: 25 mmol/L (ref 20.0–28.0)
O2 SAT: 95 %
Patient temperature: 97.9
TCO2: 26 mmol/L (ref 0–100)
pCO2 arterial: 35.3 mmHg (ref 32.0–48.0)
pH, Arterial: 7.457 — ABNORMAL HIGH (ref 7.350–7.450)
pO2, Arterial: 71 mmHg — ABNORMAL LOW (ref 83.0–108.0)

## 2017-07-30 LAB — GLUCOSE, CAPILLARY
GLUCOSE-CAPILLARY: 118 mg/dL — AB (ref 65–99)
Glucose-Capillary: 130 mg/dL — ABNORMAL HIGH (ref 65–99)
Glucose-Capillary: 112 mg/dL — ABNORMAL HIGH (ref 65–99)
Glucose-Capillary: 127 mg/dL — ABNORMAL HIGH (ref 65–99)
Glucose-Capillary: 107 mg/dL — ABNORMAL HIGH (ref 65–99)

## 2017-07-30 LAB — RENAL FUNCTION PANEL
ALBUMIN: 2.1 g/dL — AB (ref 3.5–5.0)
ANION GAP: 4 — AB (ref 5–15)
BUN: 40 mg/dL — ABNORMAL HIGH (ref 6–20)
CALCIUM: 8.3 mg/dL — AB (ref 8.9–10.3)
CO2: 27 mmol/L (ref 22–32)
Chloride: 119 mmol/L — ABNORMAL HIGH (ref 101–111)
Creatinine, Ser: 1 mg/dL (ref 0.61–1.24)
GLUCOSE: 123 mg/dL — AB (ref 65–99)
PHOSPHORUS: 3.4 mg/dL (ref 2.5–4.6)
POTASSIUM: 3.9 mmol/L (ref 3.5–5.1)
SODIUM: 150 mmol/L — AB (ref 135–145)

## 2017-07-30 LAB — CBC WITH DIFFERENTIAL/PLATELET
BASOS ABS: 0 10*3/uL (ref 0.0–0.1)
Basophils Relative: 0 %
Eosinophils Absolute: 0.1 10*3/uL (ref 0.0–0.7)
Eosinophils Relative: 1 %
HEMATOCRIT: 29.5 % — AB (ref 39.0–52.0)
HEMOGLOBIN: 8.6 g/dL — AB (ref 13.0–17.0)
LYMPHS PCT: 7 %
Lymphs Abs: 1.1 10*3/uL (ref 0.7–4.0)
MCH: 28.6 pg (ref 26.0–34.0)
MCHC: 29.2 g/dL — ABNORMAL LOW (ref 30.0–36.0)
MCV: 98 fL (ref 78.0–100.0)
MONO ABS: 0.6 10*3/uL (ref 0.1–1.0)
Monocytes Relative: 4 %
NEUTROS ABS: 13.8 10*3/uL — AB (ref 1.7–7.7)
NEUTROS PCT: 88 %
Platelets: 214 10*3/uL (ref 150–400)
RBC: 3.01 MIL/uL — ABNORMAL LOW (ref 4.22–5.81)
RDW: 18.9 % — AB (ref 11.5–15.5)
WBC: 15.5 10*3/uL — ABNORMAL HIGH (ref 4.0–10.5)

## 2017-07-30 LAB — HEPARIN LEVEL (UNFRACTIONATED): HEPARIN UNFRACTIONATED: 0.3 [IU]/mL (ref 0.30–0.70)

## 2017-07-30 LAB — AMMONIA: AMMONIA: 21 umol/L (ref 9–35)

## 2017-07-30 LAB — MAGNESIUM: Magnesium: 2.2 mg/dL (ref 1.7–2.4)

## 2017-07-30 LAB — PROCALCITONIN: PROCALCITONIN: 0.15 ng/mL

## 2017-07-30 LAB — CORTISOL: CORTISOL PLASMA: 15.8 ug/dL

## 2017-07-30 MED ORDER — FUROSEMIDE 10 MG/ML IJ SOLN
40.0000 mg | Freq: Once | INTRAMUSCULAR | Status: AC
  Administered 2017-07-30: 40 mg via INTRAVENOUS
  Filled 2017-07-30: qty 4

## 2017-07-30 NOTE — Progress Notes (Signed)
Patient ID: Brandon [name], male   DOB: 1946/09/02, 71 y.o.   MRN: [id number]     Advanced Heart Failure Rounding Note   Subjective:    8/3 developed progressive respiratory distress => Bipap => intubation.  With sedation, became hypotensive and bradycardic, dopamine + phenylephrine started.  Developed AKI. Hyperkalemia treated with HCO3 gtt and Kayexalate.  Also with metabolic acidosis with elevated lactate (HCO3 gtt begun).  LFTs rose to the 1000s range.  Extubated 07/25/17.  Cor-Track placed 07/26/17  Much more lethargic today. Awakens but won't follow commands. Cough worse. More rhonchorous. Afebrile. CVP 6  Antimicrobials this admission: 8/9 Cefepime >>8/12 8/10 Vancomycin >>8/12 8/12 Cipro >> 8/18  Microbiology results: 8/2 MRSA: negative 8/3 BCx: NGTD 8/8 TA: few S. Aureus, few Enterobacter - both S to cipro  Studies:  ECHO (5/18): EF 55-60%, moderate AS, moderate AI, moderate MR.   TTE (8/18): EF 55-60%, moderate LVH, moderate AS mean 33, moderate AI, moderate-severe MR  Objective:   Weight Range: 105.4 kg (232 lb 5.8 oz) Body mass index is 29.83 kg/m.   Vital Signs:   Temp:  [97.6 F (36.4 C)-99.9 F (37.7 C)] 99.9 F (37.7 C) (08/19 0753) Pulse Rate:  [72-102] 72 (08/19 1000) Resp:  [17-27] 24 (08/19 1000) BP: (112-176)/(48-75) 139/58 (08/19 1000) SpO2:  [95 %-100 %] 100 % (08/19 1000) Weight:  [105.4 kg (232 lb 5.8 oz)] 105.4 kg (232 lb 5.8 oz) (08/19 0400) Last BM Date: 07/27/17  Weight change: Filed Weights   07/28/17 0500 07/29/17 0500 07/30/17 0400  Weight: 109.7 kg (241 lb 13.5 oz) 105.6 kg (232 lb 12.9 oz) 105.4 kg (232 lb 5.8 oz)   Intake/Output:   Intake/Output Summary (Last 24 hours) at 07/30/17 1044 Last data filed at 07/30/17 1000  Gross per 24 hour  Intake             2740 ml  Output             2125 ml  Net              615 ml    Physical Exam   CVP 6  General: Chronically ill appearing.  No resp difficulty. Awake but won't  follow commands well. Rhonchorous  HEENT: Poor dentition.  + Cortrak. Anicteric Neck: supple JVP flat. carotids ok  Cor. Nondisplaced. RRR. 2/6 AS + ectopy  Lungs: Diffuse rhonchi  Abdomen: soft NT/ND good BS  Extremities: no cyanosis, clubbing, rash, 1+  edema Neuro: awake but not following commands well. Lethargic at times otherwise non-focal   Telemetry   Personally reviewed, NSR 60-70s with PACs  Labs    CBC  Recent Labs  07/29/17 0328 07/30/17 0317  WBC 14.5* 15.5*  NEUTROABS 12.3* 13.8*  HGB 8.6* 8.6*  HCT 28.9* 29.5*  MCV 97.3 98.0  PLT 211 616   Basic Metabolic Panel  Recent Labs  07/29/17 0328 07/30/17 0317  NA 151* 150*  K 3.7 3.9  CL 117* 119*  CO2 25 27  GLUCOSE 145* 123*  BUN 42* 40*  CREATININE 1.19 1.00  CALCIUM 8.4* 8.3*  MG 2.3 2.2  PHOS 3.1 3.4   Liver Function Tests  Recent Labs  07/29/17 0328 07/30/17 0317  ALBUMIN 2.3* 2.1*   No results for input(s): LIPASE, AMYLASE in the last 72 hours. Cardiac Enzymes No results for input(s): CKTOTAL, CKMB, CKMBINDEX, TROPONINI in the last 72 hours.  BNP: BNP (last 3 results)  Recent Labs  07/13/17 1341  BNP 408.4*  ProBNP (last 3 results) No results for input(s): PROBNP in the last 8760 hours.   D-Dimer No results for input(s): DDIMER in the last 72 hours. Hemoglobin A1C No results for input(s): HGBA1C in the last 72 hours. Fasting Lipid Panel No results for input(s): CHOL, HDL, LDLCALC, TRIG, CHOLHDL, LDLDIRECT in the last 72 hours. Thyroid Function Tests No results for input(s): TSH, T4TOTAL, T3FREE, THYROIDAB in the last 72 hours.  Invalid input(s): FREET3  Other results:   Imaging    No results found.   Medications:     Scheduled Medications: . amiodarone  200 mg Per Tube BID  . chlorhexidine gluconate (MEDLINE KIT)  15 mL Mouth Rinse BID  . Chlorhexidine Gluconate Cloth  6 each Topical Daily  . feeding supplement (PRO-STAT SUGAR FREE 64)  30 mL Per Tube TID   . free water  300 mL Per Tube Q6H  . insulin aspart  0-20 Units Subcutaneous Q4H  . mouth rinse  15 mL Mouth Rinse 10 times per day  . pantoprazole sodium  40 mg Per Tube Daily  . potassium chloride  40 mEq Oral Daily  . sodium chloride flush  10-40 mL Intracatheter Q12H  . sodium chloride flush  3 mL Intravenous Q12H    Infusions: . sodium chloride    . sodium chloride 10 mL/hr at 07/30/17 1000  . dexmedetomidine (PRECEDEX) IV infusion Stopped (07/26/17 1500)  . feeding supplement (JEVITY 1.2 CAL) 1,000 mL (07/30/17 1000)  . heparin 1,500 Units/hr (07/30/17 1000)    PRN Medications: sodium chloride, fentaNYL (SUBLIMAZE) injection, glycopyrrolate, metoprolol tartrate, midazolam, nitroGLYCERIN, ondansetron (ZOFRAN) IV, sodium chloride flush, sodium chloride flush    Patient Profile   71 yo with valvular heart disease and smoking/suspected COPD, CVA with carotid stenting was admitted with dyspnea/respiratory distress.   Assessment/Plan   1. Acute hypoxemic respiratory failure: Probable mix of pulmonary edema, PNA and COPD flare - Extubated 07/25/17 - CVP 6 - More rhonchorous today. Will get CXR and ABG. Give one dose lasix. (has been on hold due to hypernatremia) - He is on prednisone for ? component of COPD exacerbation.  - Abx stopped yesterday. (10 day course  for suspected PNA => trach aspirate with Enterobacter and Staph.  ) - Discussed with PharmD.  2. Acute on chronic diastolic CHF: Baseline has at least moderate valvular disease (moderate AS, moderate AI, moderate-severe MR).  Suspect diastolic CHF triggered by poorly tolerated paroxysmal atrial fibrillation in setting of valvular disease.  Admission thought to be triggered by diastolic CHF/pulmonary edema.   He developed AKI with lactic acidosis in the setting of hypotension with sedation.  - CVP 6. Weight down.  - Lasix on hold with hypernatremia. Has been getting free H2O boluses - With worsening resp status will give  one dose lasix get CXR and ABG - Consider L/RHC per Dr. Aundra Dubin  3. Shock: Probably mixed cardiogenic/vasodilatory.  Developed in the setting of sedation with Bipap then intubation as well as bradycardia with sedation.  - Off levophed. LFTs and Creatinine improved.  4. AKI: With hyperkalemia (treated) in setting of shock. Suspect ATN.   - Creatinine now normalized  5. Elevated LFTs:  - LFTs improved.  6. Atrial fibrillation: Paroxysmal.  - Remains in NSR with frequent PACs on po amio now Continue heparin.  7. Anemia: - hgb stable 8.6 with 1u RBCs on 8/17 8. ID: Blx CX NGTD. Suspect PNA.  On vancomycin/cefepime initially, now ciprofloxacin.   - Stopped yesterday after 10 day course.  Wbc 14.5-> 15.5 9. COPD: Suspected.  Active smoker.   - Was on prednisone to cover exacerbation. Steroids now complete 10. Valvular heart disease: Echo this admission with moderate AS, moderate AI, moderate to severe MR.  May need reassessment with TEE.  May eventually need consideration of surgical valve repair/replacement with Maze given poorly tolerated afib. No change.   11. Hypernatremia:  - stable 150. Getting free H2 12. Lethargy/AMS - Worse today. Unclear etiology. Will check cortisol, PCT, ABG and ammonia.  13. Hypokalemia - Continue to supp  14. Now DNR/limited code - Successfully extubated 07/25/17.   - Family would not want CPR or intubation. Pressors ok   Much worse today. Very lethargic more rhonchorous. Proceed as above. Family clear that if he deteriorates would not want further aggressive care.   Length of Stay: 16  Glori Bickers, MD  07/30/2017, 10:44 AM  Advanced Heart Failure Team Pager [phone] (M-F; 7a - 4p)  Please contact [facility] for night-coverage after hours (4p -7a ) and weekends on [email or web address]

## 2017-07-30 NOTE — Progress Notes (Signed)
ANTICOAGULATION CONSULT NOTE - Follow Up Consult  Pharmacy Consult for Heparin  Indication: atrial fibrillation  Allergies  Allergen Reactions  . Bee Venom Anaphylaxis and Swelling    Patient Measurements: Height: 6\' 2"  (188 cm) Weight: 232 lb 5.8 oz (105.4 kg) IBW/kg (Calculated) : 82.2   Vital Signs: Temp: 99.9 F (37.7 C) (08/19 0753) Temp Source: Oral (08/19 0753) BP: 139/58 (08/19 1000) Pulse Rate: 72 (08/19 1000)  Labs:  Recent Labs  07/28/17 0613 07/29/17 0328 07/30/17 0317  HGB 7.6* 8.6* 8.6*  HCT 25.3* 28.9* 29.5*  PLT 211 211 214  HEPARINUNFRC 0.28* 0.28* 0.30  CREATININE 1.20 1.19 1.00    Estimated Creatinine Clearance: 87.7 mL/min (by C-G formula based on SCr of 1 mg/dL).  . sodium chloride    . sodium chloride 10 mL/hr at 07/30/17 1000  . dexmedetomidine (PRECEDEX) IV infusion Stopped (07/26/17 1500)  . feeding supplement (JEVITY 1.2 CAL) 1,000 mL (07/30/17 1000)  . heparin 1,500 Units/hr (07/30/17 1000)     Assessment: 70 y/o M continues on heparin drip for afib. Today's heparin level at goal. Hgb stable at 8.6 s/p PRBCs yesterday, no overt bleeding noted.   Goal of Therapy:  Heparin level 0.3-0.7 units/ml Monitor platelets by anticoagulation protocol: Yes   Plan:  Continue heparin at 1500 units / hr - given low Hgb and need for PRBCs will not increase heparin. Follow up AM heparin level, CBC F/u ability to start oral anticoagulation eventually.  Uvaldo Rising, BCPS  Clinical Pharmacist Pager [phone]  07/30/2017 11:33 AM

## 2017-07-31 LAB — CBC WITH DIFFERENTIAL/PLATELET
Basophils Absolute: 0 10*3/uL (ref 0.0–0.1)
Basophils Relative: 0 %
EOS PCT: 1 %
Eosinophils Absolute: 0.1 10*3/uL (ref 0.0–0.7)
HEMATOCRIT: 28.6 % — AB (ref 39.0–52.0)
Hemoglobin: 8.8 g/dL — ABNORMAL LOW (ref 13.0–17.0)
LYMPHS ABS: 0.8 10*3/uL (ref 0.7–4.0)
LYMPHS PCT: 6 %
MCH: 29.9 pg (ref 26.0–34.0)
MCHC: 30.8 g/dL (ref 30.0–36.0)
MCV: 97.3 fL (ref 78.0–100.0)
MONO ABS: 0.7 10*3/uL (ref 0.1–1.0)
Monocytes Relative: 5 %
NEUTROS PCT: 88 %
Neutro Abs: 11.8 10*3/uL — ABNORMAL HIGH (ref 1.7–7.7)
PLATELETS: 209 10*3/uL (ref 150–400)
RBC: 2.94 MIL/uL — AB (ref 4.22–5.81)
RDW: 18.5 % — AB (ref 11.5–15.5)
WBC: 13.5 10*3/uL — AB (ref 4.0–10.5)

## 2017-07-31 LAB — GLUCOSE, CAPILLARY
GLUCOSE-CAPILLARY: 125 mg/dL — AB (ref 65–99)
GLUCOSE-CAPILLARY: 107 mg/dL — AB (ref 65–99)
GLUCOSE-CAPILLARY: 109 mg/dL — AB (ref 65–99)
GLUCOSE-CAPILLARY: 148 mg/dL — AB (ref 65–99)
GLUCOSE-CAPILLARY: 128 mg/dL — AB (ref 65–99)
Glucose-Capillary: 130 mg/dL — ABNORMAL HIGH (ref 65–99)
Glucose-Capillary: 98 mg/dL (ref 65–99)

## 2017-07-31 LAB — RENAL FUNCTION PANEL
ANION GAP: 5 (ref 5–15)
Albumin: 2.2 g/dL — ABNORMAL LOW (ref 3.5–5.0)
BUN: 43 mg/dL — ABNORMAL HIGH (ref 6–20)
CALCIUM: 8.4 mg/dL — AB (ref 8.9–10.3)
CO2: 28 mmol/L (ref 22–32)
CREATININE: 1.16 mg/dL (ref 0.61–1.24)
Chloride: 117 mmol/L — ABNORMAL HIGH (ref 101–111)
Glucose, Bld: 133 mg/dL — ABNORMAL HIGH (ref 65–99)
Phosphorus: 3.9 mg/dL (ref 2.5–4.6)
Potassium: 3.8 mmol/L (ref 3.5–5.1)
SODIUM: 150 mmol/L — AB (ref 135–145)

## 2017-07-31 LAB — HEPARIN LEVEL (UNFRACTIONATED)
HEPARIN UNFRACTIONATED: 0.21 [IU]/mL — AB (ref 0.30–0.70)
Heparin Unfractionated: 0.26 IU/mL — ABNORMAL LOW (ref 0.30–0.70)

## 2017-07-31 LAB — PROCALCITONIN: PROCALCITONIN: 0.15 ng/mL

## 2017-07-31 LAB — MAGNESIUM: Magnesium: 2.2 mg/dL (ref 1.7–2.4)

## 2017-07-31 NOTE — Progress Notes (Signed)
Patient ID: Brandon [name], male   DOB: 07/11/1946, 71 y.o.   MRN: [id number]     Advanced Heart Failure Rounding Note   Subjective:    8/3 developed progressive respiratory distress => Bipap => intubation.  With sedation, became hypotensive and bradycardic, dopamine + phenylephrine started.  Developed AKI. Hyperkalemia treated with HCO3 gtt and Kayexalate.  Also with metabolic acidosis with elevated lactate (HCO3 gtt begun).  LFTs rose to the 1000s range.  Extubated 07/25/17.  Cor-Track placed 07/26/17  More alert today. Follows commands. Wet sounding cough. Respiratory recommending NTS (Nasotracheal suction).   CXR 07/30/17 with pulmonary edema. Pct normal. Ammonia normal.   Antimicrobials this admission: 8/9 Cefepime >>8/12 8/10 Vancomycin >>8/12 8/12 Cipro >> 8/18  Microbiology results: 8/2 MRSA: negative 8/3 BCx: NGTD 8/8 TA: few S. Aureus, few Enterobacter - both S to cipro  Studies:  ECHO (5/18): EF 55-60%, moderate AS, moderate AI, moderate MR.   TTE (8/18): EF 55-60%, moderate LVH, moderate AS mean 33, moderate AI, moderate-severe MR  Objective:   Weight Range: 233 lb 14.5 oz (106.1 kg) Body mass index is 30.03 kg/m.   Vital Signs:   Temp:  [97.6 F (36.4 C)-98.5 F (36.9 C)] 97.6 F (36.4 C) (08/20 0820) Pulse Rate:  [60-98] 84 (08/20 0600) Resp:  [18-26] 25 (08/20 0600) BP: (112-166)/(48-131) 144/55 (08/20 0600) SpO2:  [94 %-100 %] 99 % (08/20 0600) Weight:  [233 lb 14.5 oz (106.1 kg)] 233 lb 14.5 oz (106.1 kg) (08/20 0537) Last BM Date: 07/30/17  Weight change: Filed Weights   07/29/17 0500 07/30/17 0400 07/31/17 0537  Weight: 232 lb 12.9 oz (105.6 kg) 232 lb 5.8 oz (105.4 kg) 233 lb 14.5 oz (106.1 kg)   Intake/Output:   Intake/Output Summary (Last 24 hours) at 07/31/17 0858 Last data filed at 07/31/17 0800  Gross per 24 hour  Intake             2343 ml  Output             3340 ml  Net             -997 ml    Physical Exam   CVP  5-6  General: Chronically ill appearing. NAD.  HEENT: Poor dentition. + Cortrak. Anicteric.  Neck: Supple. JVP flat. Carotids 2+ bilat; no bruits. No thyromegaly or nodule noted. Cor: PMI nondisplaced. RRR, 2/6 AS + ectopy.  Lungs: Diffuse rhonchi.  Abdomen: Soft, NT/ND. No HSM. No bruits or masses. +BS  Extremities: No cyanosis, clubbing, or rash. Trace to 1+ edema.  Neuro: Awake. Alert to person. Following commands this am. Intermittent lethargy.   Telemetry   Personally reviewed, NSR 60-70s with PACs.   Labs    CBC  Recent Labs  07/30/17 0317 07/31/17 0636  WBC 15.5* 13.5*  NEUTROABS 13.8* 11.8*  HGB 8.6* 8.8*  HCT 29.5* 28.6*  MCV 98.0 97.3  PLT 214 127   Basic Metabolic Panel  Recent Labs  07/30/17 0317 07/31/17 0636  NA 150* 150*  K 3.9 3.8  CL 119* 117*  CO2 27 28  GLUCOSE 123* 133*  BUN 40* 43*  CREATININE 1.00 1.16  CALCIUM 8.3* 8.4*  MG 2.2 2.2  PHOS 3.4 3.9   Liver Function Tests  Recent Labs  07/30/17 0317 07/31/17 0636  ALBUMIN 2.1* 2.2*   No results for input(s): LIPASE, AMYLASE in the last 72 hours. Cardiac Enzymes No results for input(s): CKTOTAL, CKMB, CKMBINDEX, TROPONINI in the last 72 hours.  BNP: BNP (last 3 results)  Recent Labs  07/13/17 1341  BNP 408.4*    ProBNP (last 3 results) No results for input(s): PROBNP in the last 8760 hours.   D-Dimer No results for input(s): DDIMER in the last 72 hours. Hemoglobin A1C No results for input(s): HGBA1C in the last 72 hours. Fasting Lipid Panel No results for input(s): CHOL, HDL, LDLCALC, TRIG, CHOLHDL, LDLDIRECT in the last 72 hours. Thyroid Function Tests No results for input(s): TSH, T4TOTAL, T3FREE, THYROIDAB in the last 72 hours.  Invalid input(s): FREET3  Other results:   Imaging    Dg Chest Port 1v Same Day  Result Date: 07/30/2017 CLINICAL DATA:  71 year old male with a history of dyspnea EXAM: PORTABLE CHEST 1 VIEW COMPARISON:  07/23/2017, 07/22/2017  FINDINGS: Cardiomediastinal silhouette likely unchanged, with the heart borders obscured by overlying lung and pleural disease. New mixed interstitial and airspace opacities extending from the hilar regions towards the periphery. Interlobular septal thickening. No pneumothorax. Blunting of the right costophrenic angle. Compare to the prior plain film there has been removal of the endotracheal tube. Enteric feeding tube projects over the mediastinum, terminating out of the field of view. Interval placement of right IJ central line, appearing to terminate superior vena cava. IMPRESSION: Interval development of pulmonary edema. Interval extubation, removal of the gastric tube, and placement of enteric feeding tube which terminates out of the field of view. Unchanged right IJ central venous catheter. Electronically Signed   By: Corrie Mckusick D.O.   On: 07/30/2017 12:43     Medications:     Scheduled Medications: . amiodarone  200 mg Per Tube BID  . chlorhexidine gluconate (MEDLINE KIT)  15 mL Mouth Rinse BID  . Chlorhexidine Gluconate Cloth  6 each Topical Daily  . feeding supplement (PRO-STAT SUGAR FREE 64)  30 mL Per Tube TID  . free water  300 mL Per Tube Q6H  . insulin aspart  0-20 Units Subcutaneous Q4H  . mouth rinse  15 mL Mouth Rinse 10 times per day  . pantoprazole sodium  40 mg Per Tube Daily  . potassium chloride  40 mEq Oral Daily  . sodium chloride flush  10-40 mL Intracatheter Q12H  . sodium chloride flush  3 mL Intravenous Q12H    Infusions: . sodium chloride    . sodium chloride Stopped (07/30/17 1101)  . dexmedetomidine (PRECEDEX) IV infusion Stopped (07/26/17 1500)  . feeding supplement (JEVITY 1.2 CAL) 1,000 mL (07/30/17 2101)  . heparin 1,500 Units/hr (07/30/17 1800)    PRN Medications: sodium chloride, fentaNYL (SUBLIMAZE) injection, glycopyrrolate, metoprolol tartrate, midazolam, nitroGLYCERIN, ondansetron (ZOFRAN) IV, sodium chloride flush, sodium chloride  flush    Patient Profile   71 yo with valvular heart disease and smoking/suspected COPD, CVA with carotid stenting was admitted with dyspnea/respiratory distress.   Assessment/Plan   1. Acute hypoxemic respiratory failure: Probable mix of pulmonary edema, PNA and COPD flare - Extubated 07/25/17 - CVP 5-6. More rhonchorous with upper respiratory rhonchi. CXR with more edema yesterday. CVP 5 after 40 mg IV lasix yesterday.  - Respiratory recommending NTS (Nasotracheal sunctioning). Will discuss with MD.  - He is on prednisone for ? component of COPD exacerbation.  - Abx stopped 07/29/17. (10 day course  for suspected PNA => trach aspirate with Enterobacter and Staph.  ) 2. Acute on chronic diastolic CHF: Baseline has at least moderate valvular disease (moderate AS, moderate AI, moderate-severe MR).  Suspect diastolic CHF triggered by poorly tolerated paroxysmal atrial fibrillation in  setting of valvular disease.  Admission thought to be triggered by diastolic CHF/pulmonary edema.   He developed AKI with lactic acidosis in the setting of hypotension with sedation.  - CVP 5. Weight up 1 lb.   - Hold lasix today with on-going hypernatremia. CVP 5. Has been getting free H2O boluses - Consider L/RHC per Dr. Aundra Dubin later this week. 3. Shock: Probably mixed cardiogenic/vasodilatory.  Developed in the setting of sedation with Bipap then intubation as well as bradycardia with sedation.  - Off levophed. LFTs and Creatinine improved. No change.  4. AKI: With hyperkalemia (treated) in setting of shock. Suspect ATN.   - Creatinine now WNL.   5. Elevated LFTs:  - LFTs improved.  6. Atrial fibrillation: Paroxysmal.  - Remains in NSR with frequent PACs on po amio now. Continue heparin.  7. Anemia: - Hgb 8.8 with 1u RBCs on 8/17 8. ID: Blx CX NGTD. Suspect PNA.  On vancomycin/cefepime initially, now ciprofloxacin.  - Stopped 07/30/17 after 10 day course. Wbc 14.5-> 15.5 -> 13.5. 9. COPD: Suspected.  Active  smoker.   - Was on prednisone to cover exacerbation. Steroids now complete 10. Valvular heart disease: Echo this admission with moderate AS, moderate AI, moderate to severe MR.  May need reassessment with TEE.  May eventually need consideration of surgical valve repair/replacement with Maze given poorly tolerated afib.  No change.  11. Hypernatremia:  - Stable at 150. Getting free H2.  12. Lethargy/AMS - 07/30/17 -> Cortisol, PCT, and ammonia normal. ABG compensated.  13. Hypokalemia - Stable today.   14. Now DNR/limited code - Successfully extubated 07/25/17.   - Family would not want CPR or intubation. Pressors ok    Length of Stay: [address]  Annamaria Helling  07/31/2017, 8:58 AM  Advanced Heart Failure Team Pager [phone] (M-F; 7a - 4p)  Please contact [facility] for night-coverage after hours (4p -7a ) and weekends on [email or web address]  Patient seen with PA, agree with the above note.  Gradual improvement.  CVP 5 today, will hold off on Lasix.  Very weak, will need CIR eventually.  Remains in NSR with PACs.    Will plan TEE on Wednesday to reassess mitral and aortic valves.  It is possible that this presentation is driven by valvular disease + atrial fibrillation triggering CHF.  He will need a lot of rehab before he is strong enough for heart surgery.   Will plan LHC/RHC probably on Thursday.    Continue heparin gtt with PAF.   D/c CVL, place PICC.  Loralie Champagne 07/31/2017 12:59 PM

## 2017-07-31 NOTE — Progress Notes (Addendum)
Physical Therapy Treatment Patient Details Name: Brandon [name] MRN: [id number] DOB: [birth year] Today's Date: 07/31/2017    History of Present Illness 71 y.o. heavy smoker admitted with acute hypoxic respiratory failure along with atrial fibrillation with rapid ventricular response. Patient was found to be in volume overload. With confusion occurring after admission and hypotension on Precedex in the setting of acute on chronic renal failure patient was intubated on 8/3. Intubated 8/3-8/14.      PT Comments    Pt admitted with above diagnosis. Pt currently with functional limitations due to the deficits listed below (see PT Problem List). Pt was able to sit up with STedy and stand x2.  Progressing.  Pt was less dyspneic today with activity.  Continue to progress pt as able.  Pt will benefit from skilled PT to increase their independence and safety with mobility to allow discharge to the venue listed below.     Follow Up Recommendations  CIR;Supervision/Assistance - 24 hour     Equipment Recommendations  Rolling walker with 5" wheels;3in1 (PT);Wheelchair (measurements PT);Wheelchair cushion (measurements PT)    Recommendations for Other Services Rehab consult     Precautions / Restrictions Precautions Precautions: Shoulder Precaution Comments: Per Dr Tamera Punt (pt s/p Lt RCR early June) NWB Lt UE except to assist with standing and able to use RW; No pulling or pushing with Lt UE;  unrestricted PROM/AROM is okay.  No strengthening   Restrictions Weight Bearing Restrictions: No    Mobility  Bed Mobility Overal bed mobility: Needs Assistance Bed Mobility: Supine to Sit     Supine to sit: Max assist;+2 for physical assistance;Mod assist     General bed mobility comments: Pt needed assist to move LEs off bed and for elevation of trunk. Used pad to scoot pt to EOB.  Takes a few moments for pt to attain upright sitting needing max assist inittially to maintain sitting and progressing  to min to min guard assist.   Transfers Overall transfer level: Needs assistance Equipment used:  Charlaine Dalton) Transfers: Sit to/from Stand Sit to Stand: Max assist;+2 physical assistance;From elevated surface;Mod assist         General transfer comment: Pt needed mod to max assist to power up and pulled up on Stedy.  took incr time to come to full stand needing constant cuing but did achieve standing for 1 min while nurse cleaning pt as he has BM smear.  Pt needed a sitting rest break on the [place].  VSS.  Moved STedy to recliner and pt stood  once more with pt standing  fully upright 15 seconds and then pt with controlled descent into recliner.      Ambulation/Gait                 Stairs            Wheelchair Mobility    Modified Rankin (Stroke Patients Only)       Balance Overall balance assessment: Needs assistance;History of Falls Sitting-balance support: Bilateral upper extremity supported;Feet supported Sitting balance-Leahy Scale: Zero Sitting balance - Comments: Pt needed max assist to mod assist at times to sit EOB with pt leaning all directions but worse to left and posterior.  Toward end of sitting did sit with min guard assist for brief time. Postural control: Left lateral lean;Posterior lean Standing balance support: Bilateral upper extremity supported;During functional activity Standing balance-Leahy Scale: Poor Standing balance comment: Required Stedy and +2 mod to max assist to stand.  Cognition Arousal/Alertness: Awake/alert Behavior During Therapy: Flat affect Overall Cognitive Status: Impaired/Different from baseline Area of Impairment: Orientation;Following commands;Safety/judgement;Awareness;Problem solving;Memory                 Orientation Level: Disoriented to;Time;Situation;Place   Memory: Decreased short-term memory Following Commands: Follows one step commands with increased  time Safety/Judgement: Decreased awareness of safety;Decreased awareness of deficits Awareness: Intellectual Problem Solving: Slow processing;Decreased initiation;Difficulty sequencing;Requires verbal cues;Requires tactile cues        Exercises General Exercises - Lower Extremity Ankle Circles/Pumps: AAROM;Both;10 reps;Supine Quad Sets: Both;10 reps;Supine;AROM Long Arc Quad: AROM;AAROM;Both;10 reps;Seated    General Comments        Pertinent Vitals/Pain Pain Assessment: No/denies pain   VSS with treatment today Home Living                      Prior Function            PT Goals (current goals can now be found in the care plan section) Progress towards PT goals: Progressing toward goals    Frequency    Min 3X/week      PT Plan Current plan remains appropriate    Co-evaluation              AM-PAC PT "6 Clicks" Daily Activity  Outcome Measure  Difficulty turning over in bed (including adjusting bedclothes, sheets and blankets)?: Unable Difficulty moving from lying on back to sitting on the side of the bed? : Unable Difficulty sitting down on and standing up from a chair with arms (e.g., wheelchair, bedside commode, etc,.)?: A Lot Help needed moving to and from a bed to chair (including a wheelchair)?: Total Help needed walking in hospital room?: Total Help needed climbing 3-5 steps with a railing? : Total 6 Click Score: 7    End of Session Equipment Utilized During Treatment: Gait belt Activity Tolerance: Patient limited by fatigue Patient left: in chair;with call bell/phone within reach;with family/visitor present Nurse Communication: Mobility status;Need for lift equipment (Stedy vs. lift pad Maxi sky) PT Visit Diagnosis: Unsteadiness on feet (R26.81);Muscle weakness (generalized) (M62.81)     Time: 7262-0355 PT Time Calculation (min) (ACUTE ONLY): 24 min  Charges:  $Therapeutic Exercise: 8-22 mins $Therapeutic Activity: 8-22 mins                     G Codes:       Adonus Uselman,PT Acute Rehabilitation [phone] (pager)    Denice Paradise 07/31/2017, 11:35 AM

## 2017-07-31 NOTE — Progress Notes (Signed)
Patient found at the foot of the bed trying to crawl out.  Patient is uncooperative but was returned to bed.  Patient has 3 new skin tears on his left arm. All lines and tubes intact. Mittens and waist belt applied.  Dr Aundra Dubin notified.

## 2017-07-31 NOTE — Progress Notes (Signed)
ANTICOAGULATION CONSULT NOTE - Follow Up Consult  Pharmacy Consult for Heparin  Indication: atrial fibrillation  Allergies  Allergen Reactions  . Bee Venom Anaphylaxis and Swelling    Patient Measurements: Height: 6\' 2"  (188 cm) Weight: 233 lb 14.5 oz (106.1 kg) IBW/kg (Calculated) : 82.2   Vital Signs: Temp: 97.6 F (36.4 C) (08/20 1154) Temp Source: Oral (08/20 1154) BP: 116/61 (08/20 1200) Pulse Rate: 75 (08/20 1200)  Labs:  Recent Labs  07/29/17 0328 07/30/17 0317 07/31/17 0500 07/31/17 0636  HGB 8.6* 8.6*  --  8.8*  HCT 28.9* 29.5*  --  28.6*  PLT 211 214  --  209  HEPARINUNFRC 0.28* 0.30 0.21* 0.26*  CREATININE 1.19 1.00  --  1.16    Estimated Creatinine Clearance: 75.8 mL/min (by C-G formula based on SCr of 1.16 mg/dL).  . sodium chloride    . sodium chloride Stopped (07/30/17 1101)  . dexmedetomidine (PRECEDEX) IV infusion Stopped (07/26/17 1500)  . feeding supplement (JEVITY 1.2 CAL) 1,000 mL (07/30/17 2101)  . heparin 1,500 Units/hr (07/30/17 1800)     Assessment: 71 y/o M continues on heparin drip for afib. Level this morning came back slightly below goal at 0.26 on heparin drip 1500 uts/hr. Hgb back up a bit today to 8.6 s/p PRBCs over weekend, no overt bleeding noted.   Goal of Therapy:  Heparin level 0.3-0.7 units/ml Monitor platelets by anticoagulation protocol: Yes   Plan:  Increase  heparin at 1600 units / hr - F/u ability to start oral anticoagulation eventually.  Bonnita Nasuti Pharm.D. CPP, BCPS Clinical Pharmacist [phone] 07/31/2017 3:34 PM

## 2017-07-31 NOTE — Progress Notes (Signed)
Daily Progress Note   Patient Name: Brandon [name]       Date: 07/31/2017 DOB: June 20, 1946  Age: 71 y.o. MRN#: [id number] Attending Physician: Larey Dresser, MD Primary Care Physician: Maurice Small, MD Admit Date: 07/13/2017  Reason for Consultation/Follow-up: Establishing goals of care  Subjective: Mr. Swetz is much unchanged since I last saw him on Thursday. Wife at bedside.   Length of Stay: 17  Current Medications: Scheduled Meds:  . amiodarone  200 mg Per Tube BID  . chlorhexidine gluconate (MEDLINE KIT)  15 mL Mouth Rinse BID  . Chlorhexidine Gluconate Cloth  6 each Topical Daily  . feeding supplement (PRO-STAT SUGAR FREE 64)  30 mL Per Tube TID  . free water  300 mL Per Tube Q6H  . insulin aspart  0-20 Units Subcutaneous Q4H  . mouth rinse  15 mL Mouth Rinse 10 times per day  . pantoprazole sodium  40 mg Per Tube Daily  . potassium chloride  40 mEq Oral Daily  . sodium chloride flush  10-40 mL Intracatheter Q12H  . sodium chloride flush  3 mL Intravenous Q12H    Continuous Infusions: . sodium chloride    . sodium chloride Stopped (07/30/17 1101)  . dexmedetomidine (PRECEDEX) IV infusion Stopped (07/26/17 1500)  . feeding supplement (JEVITY 1.2 CAL) 1,000 mL (07/30/17 2101)  . heparin 1,600 Units/hr (07/31/17 1545)    PRN Meds: sodium chloride, fentaNYL (SUBLIMAZE) injection, glycopyrrolate, metoprolol tartrate, midazolam, nitroGLYCERIN, ondansetron (ZOFRAN) IV, sodium chloride flush, sodium chloride flush  Physical Exam  Constitutional: He appears well-developed.  HENT:  Head: Normocephalic and atraumatic.  Cardiovascular: An irregularly irregular rhythm present. Bradycardia present.   Pulmonary/Chest: No accessory muscle usage. No tachypnea. No respiratory  distress. He has rhonchi. He has rales.  Abdominal: Soft. Normal appearance.  Neurological: He is alert. He is disoriented.  Nursing note and vitals reviewed.           Vital Signs: BP 116/61   Pulse 75   Temp 98.3 F (36.8 C) (Oral)   Resp (!) 27   Ht '6\' 2"'  (1.88 m)   Wt 106.1 kg (233 lb 14.5 oz)   SpO2 92%   BMI 30.03 kg/m  SpO2: SpO2: 92 % O2 Device: O2 Device: Nasal Cannula O2 Flow Rate: O2 Flow Rate (L/min): 2 L/min  Intake/output summary:   Intake/Output Summary (Last 24 hours) at 07/31/17 1741 Last data filed at 07/31/17 0934  Gross per 24 hour  Intake             2063 ml  Output             1290 ml  Net              773 ml   LBM: Last BM Date: 07/30/17 Baseline Weight: Weight: 109.8 kg (242 lb) Most recent weight: Weight: 106.1 kg (233 lb 14.5 oz)       Palliative Assessment/Data: 30%      Patient Active Problem List   Diagnosis Date Noted  . Agitation   . Pressure injury of skin 07/20/2017  . Ventilator dependence ([place])   . Goals of care, counseling/discussion   . Palliative care encounter   . Acute respiratory failure with hypoxemia ([place])   . Acute pulmonary edema (H[facility])   . Acute diastolic heart failure ([place])   . Hypotensive episode   . Arrhythmia 07/13/2017  . Aortic stenosis, moderate 07/13/2017  . Anemia 07/13/2017  . Dyspnea 07/13/2017  . Respiratory distress 07/13/2017  . S/P left rotator cuff repair 05/18/2017  . Non-rheumatic mitral regurgitation   . HOH (hard of hearing) 06/02/2013  . Obesity (BMI 30-39.9) 06/02/2013  . Acute cholecystitis with chronic cholecystitis 06/01/2013  . Calculus of bile duct without mention of cholecystitis or obstruction 05/31/2013  . Nonspecific elevation of levels of transaminase or lactic acid dehydrogenase (LDH) 05/31/2013  . Nonspecific (abnormal) findings on radiological and other examination of biliary tract 05/31/2013  . TOBACCO ABUSE 02/22/2010  . DYSLIPIDEMIA 06/25/2009  . MITRAL INSUFFICIENCY  06/25/2009  . Essential hypertension 06/25/2009  . Aortic valve disorder 06/25/2009  . VALVULAR HEART DISEASE 06/25/2009  . Cardiovascular disease 06/25/2009  . CEREBROVASCULAR DISEASE 06/25/2009  . CEREBROVASCULAR ACCIDENT WITH RIGHT HEMIPARESIS 06/25/2009  . COPD exacerbation ([place]) 06/25/2009  . FOOT SURGERY, HX OF 06/25/2009  . POLYPECTOMY, HX OF 06/25/2009  . TONSILLECTOMY, HX OF 06/25/2009    Palliative Care Assessment & Plan   HPI: 71 y.o. male  with past medical history of CVA, HTN, HL, AS, MR, [name], current tobacco use (since 71 yo), remote hx colon polyps admitted on 07/13/2017 with chest pain, diaphoresis, elevated troponin. Hospital stay complicated by afib RVR and volume overload leading to BiPAP and ultimately ventilator 8/3. Has continued to have issues with afib/tachycardia and agitation and has not made much progress on ventilator. One way extubation 8/14 with continued supportive care and hopes for continued improvement.   Assessment: Mr. Heintzelman continues with some confusion although I think this is very slowly improving. Continues to be very debilitated and hoarse/difficult to understand his speech. Asks about going home. We worked on IS and flutter valve at bedside which took some work but he was able to utilize with much instruction.   Katharine Look is at bedside and tells [place] that her sister has "put all these bad thoughts in my head" about Mr. Rafter declining. Discussed with Katharine Look the possibility (which she knows) that he could decline especially with cough continued to be so weak BUT that each day is a gift and encouraged her to focus on the day to day regardless of the end result.   Recommendations/Plan:  One way extubation completed 07/25/17.   Dyspnea/pain: Fentanyl 25-100 mcg every 2 hours prn.   Agitation: Versed 0.5 mg every 4 hours prn.   Robinul 0.2 IV  every 4 hours prn.   Code Status:  DNR  Prognosis:   Unable to determine  Discharge  Planning:  To Be Determined   Thank you for allowing the Palliative Medicine Team to assist in the care of this patient.   Total Time 47mn Prolonged Time Billed  no      Greater than 50%  of this time was spent counseling and coordinating care related to the above assessment and plan.  AVinie Sill NP Palliative Medicine Team Pager # [phone](M-F 8a-5p) Team Phone # [phone](Nights/Weekends)

## 2017-08-01 ENCOUNTER — Inpatient Hospital Stay (HOSPITAL_COMMUNITY): Payer: Medicare HMO

## 2017-08-01 DIAGNOSIS — R451 Restlessness and agitation: Secondary | ICD-10-CM

## 2017-08-01 DIAGNOSIS — R1310 Dysphagia, unspecified: Secondary | ICD-10-CM

## 2017-08-01 LAB — CBC WITH DIFFERENTIAL/PLATELET
Basophils Absolute: 0 10*3/uL (ref 0.0–0.1)
Basophils Relative: 0 %
Eosinophils Absolute: 0.1 10*3/uL (ref 0.0–0.7)
Eosinophils Relative: 1 %
HCT: 26.4 % — ABNORMAL LOW (ref 39.0–52.0)
HEMOGLOBIN: 7.8 g/dL — AB (ref 13.0–17.0)
LYMPHS ABS: 0.9 10*3/uL (ref 0.7–4.0)
LYMPHS PCT: 8 %
MCH: 29.1 pg (ref 26.0–34.0)
MCHC: 29.5 g/dL — AB (ref 30.0–36.0)
MCV: 98.5 fL (ref 78.0–100.0)
MONOS PCT: 3 %
Monocytes Absolute: 0.4 10*3/uL (ref 0.1–1.0)
NEUTROS PCT: 88 %
Neutro Abs: 10.1 10*3/uL — ABNORMAL HIGH (ref 1.7–7.7)
Platelets: 213 10*3/uL (ref 150–400)
RBC: 2.68 MIL/uL — AB (ref 4.22–5.81)
RDW: 18.6 % — ABNORMAL HIGH (ref 11.5–15.5)
WBC: 11.5 10*3/uL — AB (ref 4.0–10.5)

## 2017-08-01 LAB — GLUCOSE, CAPILLARY
GLUCOSE-CAPILLARY: 125 mg/dL — AB (ref 65–99)
GLUCOSE-CAPILLARY: 111 mg/dL — AB (ref 65–99)
Glucose-Capillary: 142 mg/dL — ABNORMAL HIGH (ref 65–99)
Glucose-Capillary: 120 mg/dL — ABNORMAL HIGH (ref 65–99)
Glucose-Capillary: 124 mg/dL — ABNORMAL HIGH (ref 65–99)
Glucose-Capillary: 90 mg/dL (ref 65–99)
Glucose-Capillary: 113 mg/dL — ABNORMAL HIGH (ref 65–99)

## 2017-08-01 LAB — RENAL FUNCTION PANEL
Albumin: 2 g/dL — ABNORMAL LOW (ref 3.5–5.0)
Anion gap: 6 (ref 5–15)
BUN: 48 mg/dL — ABNORMAL HIGH (ref 6–20)
CHLORIDE: 118 mmol/L — AB (ref 101–111)
CO2: 26 mmol/L (ref 22–32)
Calcium: 8.3 mg/dL — ABNORMAL LOW (ref 8.9–10.3)
Creatinine, Ser: 1.19 mg/dL (ref 0.61–1.24)
GFR, EST NON AFRICAN AMERICAN: 60 mL/min — AB (ref >60)
Glucose, Bld: 144 mg/dL — ABNORMAL HIGH (ref 65–99)
POTASSIUM: 4.1 mmol/L (ref 3.5–5.1)
Phosphorus: 4.5 mg/dL (ref 2.5–4.6)
Sodium: 150 mmol/L — ABNORMAL HIGH (ref 135–145)

## 2017-08-01 LAB — PROCALCITONIN: Procalcitonin: 0.16 ng/mL

## 2017-08-01 LAB — MAGNESIUM: Magnesium: 2.4 mg/dL (ref 1.7–2.4)

## 2017-08-01 LAB — HEPARIN LEVEL (UNFRACTIONATED): Heparin Unfractionated: 0.44 IU/mL (ref 0.30–0.70)

## 2017-08-01 MED ORDER — SODIUM CHLORIDE 0.9% FLUSH
10.0000 mL | Freq: Two times a day (BID) | INTRAVENOUS | Status: DC
  Administered 2017-08-02 – 2017-08-03 (×2): 10 mL

## 2017-08-01 MED ORDER — SODIUM CHLORIDE 0.9% FLUSH: 10.0000 mL | INTRAVENOUS | Status: DC | PRN

## 2017-08-01 MED ORDER — CHLORHEXIDINE GLUCONATE CLOTH 2 % EX PADS
6.0000 | MEDICATED_PAD | Freq: Every day | CUTANEOUS | Status: DC
  Administered 2017-08-02 – 2017-08-07 (×3): 6 via TOPICAL

## 2017-08-01 MED ORDER — FUROSEMIDE 10 MG/ML IJ SOLN
40.0000 mg | Freq: Two times a day (BID) | INTRAMUSCULAR | Status: AC
  Administered 2017-08-01 (×2): 40 mg via INTRAVENOUS
  Filled 2017-08-01 (×2): qty 4

## 2017-08-01 MED ORDER — SODIUM CHLORIDE 0.9 % IV SOLN
510.0000 mg | Freq: Once | INTRAVENOUS | Status: AC
  Administered 2017-08-01: 510 mg via INTRAVENOUS
  Filled 2017-08-01: qty 17

## 2017-08-01 MED ORDER — OLANZAPINE 2.5 MG PO TABS
2.5000 mg | ORAL_TABLET | Freq: Once | ORAL | Status: AC
  Administered 2017-08-01: 2.5 mg via ORAL
  Filled 2017-08-01 (×2): qty 1

## 2017-08-01 NOTE — Progress Notes (Signed)
Daily Progress Note   Patient Name: Brandon [name]       Date: 08/01/2017 DOB: Oct 22, 1946  Age: 71 y.o. MRN#: [id number] Attending Physician: Larey Dresser, MD Primary Care Physician: Maurice Small, MD Admit Date: 07/13/2017  Reason for Consultation/Follow-up: Establishing goals of care  Subjective: Brandon [name] continues to be restless (worse last night and at night when family not present). Niece, Museum/gallery conservator (NP), and wife, Katharine Look, at bedside.   Length of Stay: 18  Current Medications: Scheduled Meds:  . amiodarone  200 mg Per Tube BID  . chlorhexidine gluconate (MEDLINE KIT)  15 mL Mouth Rinse BID  . Chlorhexidine Gluconate Cloth  6 each Topical Daily  . feeding supplement (PRO-STAT SUGAR FREE 64)  30 mL Per Tube TID  . free water  300 mL Per Tube Q6H  . furosemide  40 mg Intravenous BID  . insulin aspart  0-20 Units Subcutaneous Q4H  . mouth rinse  15 mL Mouth Rinse 10 times per day  . pantoprazole sodium  40 mg Per Tube Daily  . potassium chloride  40 mEq Oral Daily  . sodium chloride flush  10-40 mL Intracatheter Q12H  . sodium chloride flush  10-40 mL Intracatheter Q12H  . sodium chloride flush  3 mL Intravenous Q12H    Continuous Infusions: . sodium chloride    . sodium chloride Stopped (07/30/17 1101)  . dexmedetomidine (PRECEDEX) IV infusion 0.4 mcg/kg/hr (08/01/17 1306)  . feeding supplement (JEVITY 1.2 CAL) 1,000 mL (07/30/17 2101)  . heparin 1,600 Units/hr (08/01/17 0600)    PRN Meds: sodium chloride, fentaNYL (SUBLIMAZE) injection, glycopyrrolate, metoprolol tartrate, midazolam, nitroGLYCERIN, ondansetron (ZOFRAN) IV, sodium chloride flush, sodium chloride flush, sodium chloride flush  Physical Exam  Constitutional: He appears well-developed.  HENT:  Head:  Normocephalic and atraumatic.  Cardiovascular: An irregularly irregular rhythm present. Bradycardia present.   Pulmonary/Chest: No accessory muscle usage. No tachypnea. No respiratory distress. He has rhonchi. He has rales.  Abdominal: Soft. Normal appearance.  Neurological: He is alert. He is disoriented.  Nursing note and vitals reviewed.           Vital Signs: BP 94/60 (BP Location: Right Leg)   Pulse 60   Temp 97.7 F (36.5 C) (Oral)   Resp 18   Ht '6\' 2"'  (1.88 m)   Wt 108.5  kg (239 lb 3.2 oz)   SpO2 97%   BMI 30.71 kg/m  SpO2: SpO2: 97 % O2 Device: O2 Device: Nasal Cannula O2 Flow Rate: O2 Flow Rate (L/min): 2 L/min  Intake/output summary:   Intake/Output Summary (Last 24 hours) at 08/01/17 1434 Last data filed at 08/01/17 1200  Gross per 24 hour  Intake          3283.43 ml  Output             2641 ml  Net           642.43 ml   LBM: Last BM Date: 07/31/17 Baseline Weight: Weight: 109.8 kg (242 lb) Most recent weight: Weight: 108.5 kg (239 lb 3.2 oz)       Palliative Assessment/Data: 30%      Patient Active Problem List   Diagnosis Date Noted  . Agitation   . Pressure injury of skin 07/20/2017  . Ventilator dependence ([place])   . Goals of care, counseling/discussion   . Palliative care encounter   . Acute respiratory failure with hypoxemia ([place])   . Acute pulmonary edema (H[facility])   . Acute diastolic heart failure ([place])   . Hypotensive episode   . Arrhythmia 07/13/2017  . Aortic stenosis, moderate 07/13/2017  . Anemia 07/13/2017  . Dyspnea 07/13/2017  . Respiratory distress 07/13/2017  . S/P left rotator cuff repair 05/18/2017  . Non-rheumatic mitral regurgitation   . HOH (hard of hearing) 06/02/2013  . Obesity (BMI 30-39.9) 06/02/2013  . Acute cholecystitis with chronic cholecystitis 06/01/2013  . Calculus of bile duct without mention of cholecystitis or obstruction 05/31/2013  . Nonspecific elevation of levels of transaminase or lactic acid  dehydrogenase (LDH) 05/31/2013  . Nonspecific (abnormal) findings on radiological and other examination of biliary tract 05/31/2013  . TOBACCO ABUSE 02/22/2010  . DYSLIPIDEMIA 06/25/2009  . MITRAL INSUFFICIENCY 06/25/2009  . Essential hypertension 06/25/2009  . Aortic valve disorder 06/25/2009  . VALVULAR HEART DISEASE 06/25/2009  . Cardiovascular disease 06/25/2009  . CEREBROVASCULAR DISEASE 06/25/2009  . CEREBROVASCULAR ACCIDENT WITH RIGHT HEMIPARESIS 06/25/2009  . COPD exacerbation ([place]) 06/25/2009  . FOOT SURGERY, HX OF 06/25/2009  . POLYPECTOMY, HX OF 06/25/2009  . TONSILLECTOMY, HX OF 06/25/2009    Palliative Care Assessment & Plan   HPI: 71 y.o. male  with past medical history of CVA, HTN, HL, AS, MR, [name], current tobacco use (since 71 yo), remote hx colon polyps admitted on 07/13/2017 with chest pain, diaphoresis, elevated troponin. Hospital stay complicated by afib RVR and volume overload leading to BiPAP and ultimately ventilator 8/3. Has continued to have issues with afib/tachycardia and agitation and has not made much progress on ventilator. One way extubation 8/14 with continued supportive care and hopes for continued improvement.   Assessment: Family at bedside are discussing risk vs benefits of TEE/cath at this point. I am more concerned with his swallowing and cough at this present time (if this does not progress then the other tests are of limited benefit). Katharine Look has always said he would NOT want PEG.   We also discussed his nighttime agitation and how we can assist with this. Katharine Look is concerned as he has not really slept more than 1-2 hours at most since extubation. Discussed with my team - recs below.   Recommendations/Plan:  One way extubation completed 07/25/17.   Dyspnea/pain: Fentanyl 25-100 mcg every 2 hours prn.   Agitation: Discussed with Dr. Domingo Cocking and will d/c precedex and give trial dose of Zyprexa  2.5 mg po tonight. Will follow toleration  in the am.   Robinul 0.2 IV every 4 hours prn.   Code Status:  DNR  Prognosis:   Unable to determine  Discharge Planning:  To Be Determined   Thank you for allowing the Palliative Medicine Team to assist in the care of this patient.   Total Time 22mn Prolonged Time Billed  no      Greater than 50%  of this time was spent counseling and coordinating care related to the above assessment and plan.  AVinie Sill NP Palliative Medicine Team Pager # [phone](M-F 8a-5p) Team Phone # [phone](Nights/Weekends)

## 2017-08-01 NOTE — Progress Notes (Signed)
Patient ID: Brandon [name], male   DOB: 1946-05-10, 71 y.o.   MRN: [id number]     Advanced Heart Failure Rounding Note   Subjective:    8/3 developed progressive respiratory distress => Bipap => intubation.  With sedation, became hypotensive and bradycardic, dopamine + phenylephrine started.  Developed AKI. Hyperkalemia treated with HCO3 gtt and Kayexalate.  Also with metabolic acidosis with elevated lactate (HCO3 gtt begun).  LFTs rose to the 1000s range.  Extubated 07/25/17.  Cor-Track placed 07/26/17, getting tube feeds.   More delirious/confused.  Had to be restrained overnight and Precedex restarted.  PCT has been low (0.16 today).  NH3 was not elevated on 8/19.  He is afebrile with WBCs 11.    CVP 10 today.  He is in NSR.  Creatinine stable 1.19.   Antimicrobials this admission: 8/9 Cefepime >>8/12 8/10 Vancomycin >>8/12 8/12 Cipro >> 8/18  Microbiology results: 8/2 MRSA: negative 8/3 BCx: NGTD 8/8 TA: few S. Aureus, few Enterobacter - both S to cipro  Studies:  ECHO (5/18): EF 55-60%, moderate AS, moderate AI, moderate MR.   ECHO (8/18): EF 55-60%, moderate LVH, moderate AS mean 33, moderate AI, moderate-severe MR  Objective:   Weight Range: 239 lb 3.2 oz (108.5 kg) Body mass index is 30.71 kg/m.   Vital Signs:   Temp:  [97.6 F (36.4 C)-98.8 F (37.1 C)] 97.8 F (36.6 C) (08/21 0400) Pulse Rate:  [43-123] 62 (08/21 0800) Resp:  [15-28] 22 (08/21 0800) BP: (86-180)/(39-91) 110/47 (08/21 0800) SpO2:  [86 %-98 %] 98 % (08/21 0800) FiO2 (%):  [2 %] 2 % (08/21 0400) Weight:  [239 lb 3.2 oz (108.5 kg)] 239 lb 3.2 oz (108.5 kg) (08/21 0400) Last BM Date: 07/31/17  Weight change: Filed Weights   07/30/17 0400 07/31/17 0537 08/01/17 0400  Weight: 232 lb 5.8 oz (105.4 kg) 233 lb 14.5 oz (106.1 kg) 239 lb 3.2 oz (108.5 kg)   Intake/Output:   Intake/Output Summary (Last 24 hours) at 08/01/17 0848 Last data filed at 08/01/17 0800  Gross per 24 hour  Intake           2724.23 ml  Output             1291 ml  Net          1433.23 ml    Physical Exam   CVP 10  General: Chronically ill appearing. NAD. In restraints.  HEENT: Poor dentition. + Cortrak. Anicteric.  Neck: Supple. JVP 8-9 cm. Carotids 2+ bilat; no bruits. No thyromegaly or nodule noted. Cor: PMI nondisplaced. RRR, 2/6 crescendo-decrescendo systolic murmur RUSB.  Lungs: Diffuse rhonchi bilaterally.  Abdomen: Soft, NT/ND. No HSM. No bruits or masses. +BS  Extremities: No cyanosis, clubbing, or rash. No edema Neuro: Awake/alert but confused and oriented only to person.  Telemetry   Personally reviewed, NSR 70s occasional PACs.    Labs    CBC  Recent Labs  07/31/17 0636 08/01/17 0432  WBC 13.5* 11.5*  NEUTROABS 11.8* 10.1*  HGB 8.8* 7.8*  HCT 28.6* 26.4*  MCV 97.3 98.5  PLT 209 409   Basic Metabolic Panel  Recent Labs  07/31/17 0636 08/01/17 0432  NA 150* 150*  K 3.8 4.1  CL 117* 118*  CO2 28 26  GLUCOSE 133* 144*  BUN 43* 48*  CREATININE 1.16 1.19  CALCIUM 8.4* 8.3*  MG 2.2 2.4  PHOS 3.9 4.5   Liver Function Tests  Recent Labs  07/31/17 0636 08/01/17 0432  ALBUMIN 2.2*  2.0*   No results for input(s): LIPASE, AMYLASE in the last 72 hours. Cardiac Enzymes No results for input(s): CKTOTAL, CKMB, CKMBINDEX, TROPONINI in the last 72 hours.  BNP: BNP (last 3 results)  Recent Labs  07/13/17 1341  BNP 408.4*    ProBNP (last 3 results) No results for input(s): PROBNP in the last 8760 hours.   D-Dimer No results for input(s): DDIMER in the last 72 hours. Hemoglobin A1C No results for input(s): HGBA1C in the last 72 hours. Fasting Lipid Panel No results for input(s): CHOL, HDL, LDLCALC, TRIG, CHOLHDL, LDLDIRECT in the last 72 hours. Thyroid Function Tests No results for input(s): TSH, T4TOTAL, T3FREE, THYROIDAB in the last 72 hours.  Invalid input(s): FREET3  Other results:   Imaging    No results found.   Medications:      Scheduled Medications: . amiodarone  200 mg Per Tube BID  . chlorhexidine gluconate (MEDLINE KIT)  15 mL Mouth Rinse BID  . Chlorhexidine Gluconate Cloth  6 each Topical Daily  . feeding supplement (PRO-STAT SUGAR FREE 64)  30 mL Per Tube TID  . free water  300 mL Per Tube Q6H  . furosemide  40 mg Intravenous BID  . insulin aspart  0-20 Units Subcutaneous Q4H  . mouth rinse  15 mL Mouth Rinse 10 times per day  . pantoprazole sodium  40 mg Per Tube Daily  . potassium chloride  40 mEq Oral Daily  . sodium chloride flush  10-40 mL Intracatheter Q12H  . sodium chloride flush  3 mL Intravenous Q12H    Infusions: . sodium chloride    . sodium chloride Stopped (07/30/17 1101)  . dexmedetomidine (PRECEDEX) IV infusion 0.4 mcg/kg/hr (08/01/17 0800)  . feeding supplement (JEVITY 1.2 CAL) 1,000 mL (07/30/17 2101)  . heparin 1,600 Units/hr (08/01/17 0600)    PRN Medications: sodium chloride, fentaNYL (SUBLIMAZE) injection, glycopyrrolate, metoprolol tartrate, midazolam, nitroGLYCERIN, ondansetron (ZOFRAN) IV, sodium chloride flush, sodium chloride flush    Patient Profile   71 yo with valvular heart disease and smoking/suspected COPD, CVA with carotid stenting was admitted with dyspnea/respiratory distress.   Assessment/Plan   1. Acute hypoxemic respiratory failure: Probable mix of pulmonary edema, PNA and COPD flare.  Extubated 07/25/17.  CVP up a bit a 10 today and lungs rhonchorous.  PCT not elevated, afebrile.  He had a course of prednisone and has completed abx.  - Lasix 40 mg IV bid x 2 today.  - Needs CXR this morning. - Abx stopped 07/29/17. (10 day course  for suspected PNA => trach aspirate with Enterobacter and Staph.  ) 2. Acute on chronic diastolic CHF: Baseline has at least moderate valvular disease (moderate AS, moderate AI, moderate-severe MR).  Suspect diastolic CHF triggered by poorly tolerated paroxysmal atrial fibrillation in setting of valvular disease.  Admission  thought to be triggered by diastolic CHF/pulmonary edema.   He developed AKI with lactic acidosis in the setting of hypotension with sedation. CVP 10 today, somewhat higher.  Rhonchorous lungs.  - Lasix 40 mg IV bid today.  - Plan for RHC/LHC possibly Thursday if stable.  3. Shock: Probably mixed cardiogenic/vasodilatory.  Developed in the setting of sedation with Bipap then intubation as well as bradycardia with sedation.  Resolved, has been off pressors.  4. AKI: With hyperkalemia (treated) in setting of shock. Suspect ATN.  Creatinine stable at 1.19 though BUN remains elevated.   5. Elevated LFTs: Improved, likely shock liver.  6. Atrial fibrillation: Paroxysmal. Remains in NSR  with frequent PACs on po amio now. - Continue heparin gtt until after cath then likely DOAC.  7. Anemia: hgb down to 7.8 today.  No active bleeding. FOBT negative 8/7. Transferrin saturation was 3% on 8/5. 1u RBCs on 8/17.  - Will give feraheme today.  8. ID: Blx CX NGTD. Suspected PNA.  On vancomycin/cefepime initially, then ciprofloxacin.  Stopped 07/30/17 after 10 day course. Wbc 14.5-> 15.5 -> 13.5 -> 11.5.  PCT is not elevated (done today). - Will get CXR today.  9. COPD: Suspected.  Active smoker.  Was on prednisone to cover exacerbation. Steroids now complete 10. Valvular heart disease: Echo this admission with moderate AS, moderate AI, moderate to severe MR.  May eventually need consideration of surgical valve repair/replacement with Maze given poorly tolerated afib.  - Possible TEE tomorrow if stable to reassess mitral and aortic valves. It is possible that this presentation is driven by valvular disease + atrial fibrillation triggering CHF.  He will need a lot of rehab before he is strong enough for heart surgery. 11. Hypernatremia: Stable at 150. Getting free H2O.  12. Delirium: Worse this morning, now in restraints. He is afebrile without PCT elevation, no definite new infection.  NH3 recently checked was not  high.  Lungs rhonchorous.  - Will give IV Lasix and get CXR today.  - Wife present to re-orient.  13. Now DNR/limited code - Successfully extubated 07/25/17.   - Family would not want CPR or intubation. Pressors ok   CRITICAL CARE Performed by: Loralie Champagne  Total critical care time: 35 minutes  Critical care time was exclusive of separately billable procedures and treating other patients.  Critical care was necessary to treat or prevent imminent or life-threatening deterioration.  Critical care was time spent personally by me on the following activities: development of treatment plan with patient and/or surrogate as well as nursing, discussions with consultants, evaluation of patient's response to treatment, examination of patient, obtaining history from patient or surrogate, ordering and performing treatments and interventions, ordering and review of laboratory studies, ordering and review of radiographic studies, pulse oximetry and re-evaluation of patient's condition.  Length of Stay: 53  Loralie Champagne, MD  08/01/2017, 8:48 AM  Advanced Heart Failure Team Pager [phone] (M-F; 7a - 4p)  Please contact [facility] for night-coverage after hours (4p -7a ) and weekends on [email or web address]

## 2017-08-01 NOTE — Plan of Care (Signed)
Problem: Safety: Goal: Ability to remain free from injury will improve Outcome: Progressing Pt in restraints  pt interfering with equipment and lines.

## 2017-08-01 NOTE — Progress Notes (Signed)
CSW continuing to follow for SNF placement when appropriate  Jorge Ny, [place] Social Worker [phone]

## 2017-08-01 NOTE — Progress Notes (Signed)
Pt transferred to 27m wife at bedside.report given at bedside on 88M.

## 2017-08-01 NOTE — Progress Notes (Signed)
ANTICOAGULATION CONSULT NOTE - Follow Up Consult  Pharmacy Consult for Heparin  Indication: atrial fibrillation  Allergies  Allergen Reactions  . Bee Venom Anaphylaxis and Swelling    Patient Measurements: Height: 6\' 2"  (188 cm) Weight: 239 lb 3.2 oz (108.5 kg) IBW/kg (Calculated) : 82.2   Vital Signs: Temp: 98.5 F (36.9 C) (08/21 0905) Temp Source: Oral (08/21 0905) BP: 110/47 (08/21 0800) Pulse Rate: 62 (08/21 0800)  Labs:  Recent Labs  07/30/17 0317 07/31/17 0500 07/31/17 0636 08/01/17 0432  HGB 8.6*  --  8.8* 7.8*  HCT 29.5*  --  28.6* 26.4*  PLT 214  --  209 213  HEPARINUNFRC 0.30 0.21* 0.26* 0.44  CREATININE 1.00  --  1.16 1.19    Estimated Creatinine Clearance: 74.7 mL/min (by C-G formula based on SCr of 1.19 mg/dL).  . sodium chloride    . sodium chloride Stopped (07/30/17 1101)  . dexmedetomidine (PRECEDEX) IV infusion 0.4 mcg/kg/hr (08/01/17 0800)  . feeding supplement (JEVITY 1.2 CAL) 1,000 mL (07/30/17 2101)  . ferumoxytol    . heparin 1,600 Units/hr (08/01/17 0600)     Assessment: 71 y/o M continues on heparin drip for afib. Level this morning came back slightly below goal at 0.44 on heparin drip 1600 uts/hr. Hgb back up a bit today to 8.6 s/p PRBCs over weekend, no overt bleeding noted.   Goal of Therapy:  Heparin level 0.3-0.7 units/ml Monitor platelets by anticoagulation protocol: Yes   Plan:  Continue heparin at 1600 units / hr  F/u ability to start oral anticoagulation eventually.  Bonnita Nasuti Pharm.D. CPP, BCPS Clinical Pharmacist [phone] 08/01/2017 9:54 AM

## 2017-08-01 NOTE — Progress Notes (Signed)
Peripherally Inserted Central Catheter/Midline Placement  The IV Nurse has discussed with the patient and/or persons authorized to consent for the patient, the purpose of this procedure and the potential benefits and risks involved with this procedure.  The benefits include less needle sticks, lab draws from the catheter, and the patient may be discharged home with the catheter. Risks include, but not limited to, infection, bleeding, blood clot (thrombus formation), and puncture of an artery; nerve damage and irregular heartbeat and possibility to perform a PICC exchange if needed/ordered by physician.  Alternatives to this procedure were also discussed.  Bard Power PICC patient education guide, fact sheet on infection prevention and patient information card has been provided to patient /or left at bedside.  Consent signed by wife due to altered mental status.  PICC/Midline Placement Documentation        Brandon [name], Brandon [name] 08/01/2017, 1:51 PM

## 2017-08-01 NOTE — Progress Notes (Signed)
  Speech Language Pathology Treatment: Dysphagia  Patient Details Name: Brandon [name] MRN: [id number] DOB: November 10, 1946 Today's Date: 08/01/2017 Time: 2831-5176 SLP Time Calculation (min) (ACUTE ONLY): 21 min  Assessment / Plan / Recommendation Clinical Impression  Pt seen for dysphagia intervention with wife at bedside. Vocal quality continues to be breathy with intermittent vocalization with true vocal cords. Volitional cough is weak, similar to last week. Ice chips and thin via spoon given after oral care in hopes of breaking down saliva resulting in minimal to no expectoration of mucous with reflexive weak coughs. SLP introduced EMT (expiratory muscle trainer) to use for strengthening respiratory and swallow musculature. Trainer set to 8 cm H2O and pt indicated a moderate effort level. Instructed pt/wife to perform 1 or 2 sets of 10 repetitions 3 times a day if able. He is not ready to repeat instrumental swallow assessment. Goals are to increase strength and production of cough, vocal adduction and phonation and overall strength. Pt becomes easily fatigued.    HPI HPI: 71 yo with valvular heart disease and smoking/suspected COPD, CVA with carotid stenting was admitted with dyspnea/respiratory distress 8/2. Found to be in atrial fibrillation with rapid ventricular response and volume overload. Placed on Bipap but required intubated 8/3-8/14.       SLP Plan  Continue with current plan of care       Recommendations  Diet recommendations: NPO Medication Administration: Via alternative means                Oral Care Recommendations: Oral care QID Follow up Recommendations: Skilled Nursing facility SLP Visit Diagnosis: Dysphagia, pharyngeal phase (R13.13) Plan: Continue with current plan of care       GO                Houston Siren 08/01/2017, 3:47 PM  Orbie Pyo Gary Bultman M.Ed [employer] [phone]

## 2017-08-02 ENCOUNTER — Inpatient Hospital Stay (HOSPITAL_COMMUNITY): Payer: Medicare HMO

## 2017-08-02 ENCOUNTER — Encounter (HOSPITAL_COMMUNITY)
Admission: EM | Disposition: A | Discharge status: Rehabilitation Facility | Payer: Self-pay | Source: Home / Self Care | Attending: Cardiovascular Disease

## 2017-08-02 DIAGNOSIS — R5381 Other malaise: Secondary | ICD-10-CM

## 2017-08-02 DIAGNOSIS — M6281 Muscle weakness (generalized): Secondary | ICD-10-CM

## 2017-08-02 DIAGNOSIS — R1312 Dysphagia, oropharyngeal phase: Secondary | ICD-10-CM

## 2017-08-02 DIAGNOSIS — G8220 Paraplegia, unspecified: Secondary | ICD-10-CM

## 2017-08-02 LAB — GLUCOSE, CAPILLARY
GLUCOSE-CAPILLARY: 104 mg/dL — AB (ref 65–99)
GLUCOSE-CAPILLARY: 95 mg/dL (ref 65–99)
GLUCOSE-CAPILLARY: 102 mg/dL — AB (ref 65–99)
GLUCOSE-CAPILLARY: 141 mg/dL — AB (ref 65–99)
Glucose-Capillary: 97 mg/dL (ref 65–99)
Glucose-Capillary: 132 mg/dL — ABNORMAL HIGH (ref 65–99)

## 2017-08-02 LAB — MAGNESIUM: Magnesium: 2.2 mg/dL (ref 1.7–2.4)

## 2017-08-02 LAB — COMPREHENSIVE METABOLIC PANEL
ALBUMIN: 2.3 g/dL — AB (ref 3.5–5.0)
ALK PHOS: 46 U/L (ref 38–126)
ALT: 47 U/L (ref 17–63)
ANION GAP: 7 (ref 5–15)
AST: 32 U/L (ref 15–41)
BILIRUBIN TOTAL: 1.4 mg/dL — AB (ref 0.3–1.2)
BUN: 45 mg/dL — ABNORMAL HIGH (ref 6–20)
CALCIUM: 8.5 mg/dL — AB (ref 8.9–10.3)
CO2: 26 mmol/L (ref 22–32)
Chloride: 115 mmol/L — ABNORMAL HIGH (ref 101–111)
Creatinine, Ser: 1.21 mg/dL (ref 0.61–1.24)
GFR, EST NON AFRICAN AMERICAN: 58 mL/min — AB (ref >60)
GLUCOSE: 108 mg/dL — AB (ref 65–99)
POTASSIUM: 3.7 mmol/L (ref 3.5–5.1)
Sodium: 148 mmol/L — ABNORMAL HIGH (ref 135–145)
TOTAL PROTEIN: 5.7 g/dL — AB (ref 6.5–8.1)

## 2017-08-02 LAB — CBC WITH DIFFERENTIAL/PLATELET
BASOS ABS: 0 10*3/uL (ref 0.0–0.1)
Basophils Relative: 0 %
EOS PCT: 2 %
Eosinophils Absolute: 0.2 10*3/uL (ref 0.0–0.7)
HEMATOCRIT: 26.9 % — AB (ref 39.0–52.0)
Hemoglobin: 8.1 g/dL — ABNORMAL LOW (ref 13.0–17.0)
LYMPHS PCT: 9 %
Lymphs Abs: 0.9 10*3/uL (ref 0.7–4.0)
MCH: 29.5 pg (ref 26.0–34.0)
MCHC: 30.1 g/dL (ref 30.0–36.0)
MCV: 97.8 fL (ref 78.0–100.0)
MONO ABS: 0.7 10*3/uL (ref 0.1–1.0)
MONOS PCT: 6 %
NEUTROS ABS: 8.4 10*3/uL — AB (ref 1.7–7.7)
Neutrophils Relative %: 83 %
PLATELETS: 236 10*3/uL (ref 150–400)
RBC: 2.75 MIL/uL — ABNORMAL LOW (ref 4.22–5.81)
RDW: 18.4 % — AB (ref 11.5–15.5)
WBC: 10.2 10*3/uL (ref 4.0–10.5)

## 2017-08-02 LAB — HEPARIN LEVEL (UNFRACTIONATED): HEPARIN UNFRACTIONATED: 0.46 [IU]/mL (ref 0.30–0.70)

## 2017-08-02 SURGERY — ECHOCARDIOGRAM, TRANSESOPHAGEAL
Anesthesia: Moderate Sedation

## 2017-08-02 MED ORDER — FUROSEMIDE 10 MG/ML IJ SOLN
40.0000 mg | Freq: Two times a day (BID) | INTRAMUSCULAR | Status: DC
  Administered 2017-08-02 (×2): 40 mg via INTRAVENOUS
  Filled 2017-08-02 (×3): qty 4

## 2017-08-02 NOTE — Progress Notes (Signed)
Physical Therapy Treatment Patient Details Name: Brandon [name] MRN: [id number] DOB: [birth year] Today's Date: 08/02/2017    History of Present Illness 71 y.o. heavy smoker admitted with acute hypoxic respiratory failure along with atrial fibrillation with rapid ventricular response. Patient was found to be in volume overload. With confusion occurring after admission and hypotension on Precedex in the setting of acute on chronic renal failure patient was intubated on 8/3. Intubated 8/3-8/14.      PT Comments    Pt transferred to 75M yesterday with medical set back. Pt more lethargic this date. Pt could only tolerate bed level exercises today with max encouragement. Pt reports pt to have been very agitated Monday night but now isn't initiating movement at all. Acute PT to con't to follow.    Follow Up Recommendations  CIR;Supervision/Assistance - 24 hour - may need to change to SNF if pt con't to regress medically.     Equipment Recommendations  Rolling walker with 5" wheels;3in1 (PT);Wheelchair (measurements PT);Wheelchair cushion (measurements PT)    Recommendations for Other Services Rehab consult     Precautions / Restrictions Precautions Precautions: Shoulder Precaution Comments: Per Dr Tamera Punt (pt s/p Lt RCR early June) NWB Lt UE except to assist with standing and able to use RW; No pulling or pushing with Lt UE;  unrestricted PROM/AROM is okay.  No strengthening   Restrictions Weight Bearing Restrictions: Yes LUE Weight Bearing: Non weight bearing    Mobility  Bed Mobility Overal bed mobility: Needs Assistance Bed Mobility: Rolling Rolling: Max assist         General bed mobility comments: did not attempt EOB today due to lethargy and soft BP  Transfers                 General transfer comment: not attempted today due to lethargy  Ambulation/Gait                 Stairs            Wheelchair Mobility    Modified Rankin (Stroke Patients  Only)       Balance                                            Cognition Arousal/Alertness: Awake/alert Behavior During Therapy: Flat affect Overall Cognitive Status: Impaired/Different from baseline                                 General Comments: pt extremely HOH, has a hard time following commands but did follow gestures from wife to complete LE ther ex      Exercises General Exercises - Upper Extremity Shoulder Flexion: AAROM;Left;20 reps;Supine Shoulder ABduction: AAROM;Left;Supine;Other (comment);20 reps General Exercises - Lower Extremity Quad Sets: AAROM;Both;10 reps;Supine Heel Slides: AAROM;Both;10 reps;Supine Hip Flexion/Marching: AAROM;Both;10 reps;Supine (resisted hip/knee extension)    General Comments        Pertinent Vitals/Pain Pain Assessment: Faces Faces Pain Scale: Hurts a little bit Pain Location: L shoulder Pain Descriptors / Indicators: Grimacing    Home Living                      Prior Function            PT Goals (current goals can now be found in the care plan section) Progress towards PT  goals: Not progressing toward goals - comment (pt with medical set back)    Frequency    Min 3X/week      PT Plan Current plan remains appropriate    Co-evaluation              AM-PAC PT "6 Clicks" Daily Activity  Outcome Measure  Difficulty turning over in bed (including adjusting bedclothes, sheets and blankets)?: Unable Difficulty moving from lying on back to sitting on the side of the bed? : Unable Difficulty sitting down on and standing up from a chair with arms (e.g., wheelchair, bedside commode, etc,.)?: Unable Help needed moving to and from a bed to chair (including a wheelchair)?: Total Help needed walking in hospital room?: Total Help needed climbing 3-5 steps with a railing? : Total 6 Click Score: 6    End of Session Equipment Utilized During Treatment: Gait belt Activity  Tolerance: Patient limited by lethargy Patient left: in bed;with call bell/phone within reach;with family/visitor present Nurse Communication: Mobility status;Need for lift equipment PT Visit Diagnosis: Unsteadiness on feet (R26.81);Muscle weakness (generalized) (M62.81)     Time: 5072-2575 PT Time Calculation (min) (ACUTE ONLY): 26 min  Charges:  $Therapeutic Exercise: 23-37 mins                    G Codes:       Kittie Plater, PT, DPT Pager #: [phone] Office #: [phone]    [place] 08/02/2017, 1:52 PM

## 2017-08-02 NOTE — Progress Notes (Signed)
Patient ID: Brandon [name], male   DOB: June 30, 1946, 71 y.o.   MRN: [id number]     Advanced Heart Failure Rounding Note   Subjective:    8/3 developed progressive respiratory distress => Bipap => intubation.  With sedation, became hypotensive and bradycardic, dopamine + phenylephrine started.  Developed AKI. Hyperkalemia treated with HCO3 gtt and Kayexalate.  Also with metabolic acidosis with elevated lactate (HCO3 gtt begun).  LFTs rose to the 1000s range.  Extubated 07/25/17.  Cor-Track placed 07/26/17, getting tube feeds.   More alert today, follows commands.  Not talking.  Afebrile.  Diuresed well yesterday with IV Lasix and weight down.  CVP 11 today.   Antimicrobials this admission: 8/9 Cefepime >>8/12 8/10 Vancomycin >>8/12 8/12 Cipro >> 8/18  Microbiology results: 8/2 MRSA: negative 8/3 BCx: NGTD 8/8 TA: few S. Aureus, few Enterobacter - both S to cipro  Studies:  ECHO (5/18): EF 55-60%, moderate AS, moderate AI, moderate MR.   ECHO (8/18): EF 55-60%, moderate LVH, moderate AS mean 33, moderate AI, moderate-severe MR  Objective:   Weight Range: 233 lb 4 oz (105.8 kg) Body mass index is 29.95 kg/m.   Vital Signs:   Temp:  [97.7 F (36.5 C)-98.5 F (36.9 C)] 97.7 F (36.5 C) (08/21 2300) Pulse Rate:  [54-89] 80 (08/22 0700) Resp:  [15-26] 19 (08/22 0700) BP: (81-144)/(48-87) 130/70 (08/22 0700) SpO2:  [91 %-100 %] 97 % (08/22 0700) Weight:  [233 lb 4 oz (105.8 kg)] 233 lb 4 oz (105.8 kg) (08/22 0500) Last BM Date: 07/31/17  Weight change: Filed Weights   07/31/17 0537 08/01/17 0400 08/02/17 0500  Weight: 233 lb 14.5 oz (106.1 kg) 239 lb 3.2 oz (108.5 kg) 233 lb 4 oz (105.8 kg)   Intake/Output:   Intake/Output Summary (Last 24 hours) at 08/02/17 0846 Last data filed at 08/02/17 0600  Gross per 24 hour  Intake           2222.2 ml  Output             5170 ml  Net          -2947.8 ml    Physical Exam   CVP 11 General: Chronically ill appearing. NAD.    HEENT: Poor dentition. + Cortrak. Anicteric.  Neck: Supple. JVP 8-9 cm. Carotids 2+ bilat; no bruits. No thyromegaly or nodule noted. Cor: PMI nondisplaced. RRR, 2/6 crescendo-decrescendo systolic murmur RUSB.  Lungs: Diffuse rhonchi bilaterally.  Abdomen: Soft, NT/ND. NoHSM. No bruits or masses. +BS  Extremities: No cyanosis, clubbing, or rash. No edema Neuro: Awake/alert, still some confusion but following commands today.  Telemetry   Personally reviewed, NSR 70s-80s occasional PACs.    Labs    CBC  Recent Labs  08/01/17 0432 08/02/17 0420  WBC 11.5* 10.2  NEUTROABS 10.1* 8.4*  HGB 7.8* 8.1*  HCT 26.4* 26.9*  MCV 98.5 97.8  PLT 213 497   Basic Metabolic Panel  Recent Labs  07/31/17 0636 08/01/17 0432 08/02/17 0420  NA 150* 150* 148*  K 3.8 4.1 3.7  CL 117* 118* 115*  CO2 '28 26 26  ' GLUCOSE 133* 144* 108*  BUN 43* 48* 45*  CREATININE 1.16 1.19 1.21  CALCIUM 8.4* 8.3* 8.5*  MG 2.2 2.4 2.2  PHOS 3.9 4.5  --    Liver Function Tests  Recent Labs  08/01/17 0432 08/02/17 0420  AST  --  32  ALT  --  47  ALKPHOS  --  46  BILITOT  --  1.4*  PROT  --  5.7*  ALBUMIN 2.0* 2.3*   No results for input(s): LIPASE, AMYLASE in the last 72 hours. Cardiac Enzymes No results for input(s): CKTOTAL, CKMB, CKMBINDEX, TROPONINI in the last 72 hours.  BNP: BNP (last 3 results)  Recent Labs  07/13/17 1341  BNP 408.4*    ProBNP (last 3 results) No results for input(s): PROBNP in the last 8760 hours.   D-Dimer No results for input(s): DDIMER in the last 72 hours. Hemoglobin A1C No results for input(s): HGBA1C in the last 72 hours. Fasting Lipid Panel No results for input(s): CHOL, HDL, LDLCALC, TRIG, CHOLHDL, LDLDIRECT in the last 72 hours. Thyroid Function Tests No results for input(s): TSH, T4TOTAL, T3FREE, THYROIDAB in the last 72 hours.  Invalid input(s): FREET3  Other results:   Imaging    Dg Chest Port 1 View  Result Date:  08/01/2017 CLINICAL DATA:  Cough.  Shortness of breath. EXAM: PORTABLE CHEST 1 VIEW COMPARISON:  Chest x-ray 07/30/2017. FINDINGS: PICC line noted with tip projected superior vena cava. Feeding tube noted with tip below left hemidiaphragm. Lines and tubes in stable position. Cardiomegaly with diffuse bilateral pulmonary infiltrates/edema, unchanged from prior exam. No pleural effusion or pneumothorax. IMPRESSION: 1. PICC line noted with tip projected over superior vena cava. Feeding tube noted with tip below left hemidiaphragm. Lines and tubes in stable position. 2. Cardiomegaly with diffuse bilateral pulmonary infiltrates/ edema, unchanged from prior exam. Electronically Signed   By: [place]   On: 08/01/2017 09:24     Medications:     Scheduled Medications: . amiodarone  200 mg Per Tube BID  . chlorhexidine gluconate (MEDLINE KIT)  15 mL Mouth Rinse BID  . Chlorhexidine Gluconate Cloth  6 each Topical Daily  . feeding supplement (PRO-STAT SUGAR FREE 64)  30 mL Per Tube TID  . free water  300 mL Per Tube Q6H  . furosemide  40 mg Intravenous BID  . insulin aspart  0-20 Units Subcutaneous Q4H  . mouth rinse  15 mL Mouth Rinse 10 times per day  . pantoprazole sodium  40 mg Per Tube Daily  . potassium chloride  40 mEq Oral Daily  . sodium chloride flush  10-40 mL Intracatheter Q12H  . sodium chloride flush  10-40 mL Intracatheter Q12H  . sodium chloride flush  3 mL Intravenous Q12H    Infusions: . sodium chloride    . feeding supplement (JEVITY 1.2 CAL) Stopped (08/02/17 0001)  . heparin 1,600 Units/hr (08/02/17 0600)    PRN Medications: sodium chloride, fentaNYL (SUBLIMAZE) injection, glycopyrrolate, metoprolol tartrate, midazolam, nitroGLYCERIN, ondansetron (ZOFRAN) IV, sodium chloride flush, sodium chloride flush, sodium chloride flush    Patient Profile   71 yo with valvular heart disease and smoking/suspected COPD, CVA with carotid stenting was admitted with  dyspnea/respiratory distress.   Assessment/Plan   1. Acute hypoxemic respiratory failure: Probable mix of pulmonary edema, PNA and COPD flare.  Extubated 07/25/17.  CVP 11 today. PCT not elevated, afebrile.  He had a course of prednisone and has completed abx.  CXR yesterday showed pulmonary edema.  - Lasix 40 mg IV bid again today.  - Abx stopped 07/29/17. (10 day course  for suspected PNA => trach aspirate with Enterobacter and Staph.  ) 2. Acute on chronic diastolic CHF: Baseline has at least moderate valvular disease (moderate AS, moderate AI, moderate-severe MR).  Suspect diastolic CHF triggered by poorly tolerated paroxysmal atrial fibrillation in setting of valvular disease.  Admission thought to  be triggered by diastolic CHF/pulmonary edema.   He developed AKI with lactic acidosis in the setting of hypotension with sedation. CVP 11 today, CXR yesterday with pulmonary edema.  He diuresed well yesterday with IV Lasix.  - Lasix 40 mg IV bid today again.   - Eventual RHC/LHC but would like to see mental status improved.  3. Shock: Probably mixed cardiogenic/vasodilatory.  Developed in the setting of sedation with Bipap then intubation as well as bradycardia with sedation.  Resolved, has been off pressors.  4. AKI: With hyperkalemia (treated) in setting of shock. Suspect ATN.  Creatinine stable at 1.21 though BUN remains elevated.   5. Elevated LFTs: Improved, likely shock liver.  6. Atrial fibrillation: Paroxysmal. Remains in NSR with frequent PACs on po amio now. - Continue heparin gtt until we determine not further procedures, then DOAC.  7. Anemia: Hgb stable today.  No active bleeding. FOBT negative 8/7. Transferrin saturation was 3% on 8/5. 1u RBCs on 8/17, had feraheme 8/21.   8. ID: Blx CX NGTD. Suspected PNA.  On vancomycin/cefepime initially, then ciprofloxacin.  Stopped 07/30/17 after 10 day course. Wbc down.  PCT was not elevated 8/21. 9. COPD: Suspected.  Active smoker.  Was on  prednisone to cover exacerbation. Steroids now complete 10. Valvular heart disease: Echo this admission with moderate AS, moderate AI, moderate to severe MR.  May eventually need consideration of surgical valve repair/replacement with Maze given poorly tolerated afib.  - Eventually should have TEE, but would like to see mental status improved. It is possible that this presentation is driven by valvular disease + atrial fibrillation triggering CHF.  He will need a lot of rehab before he is strong enough for heart surgery. 11. Hypernatremia: Na lower today. Getting free H2O.  12. Delirium: Improved today, now off Precedex. He is afebrile without PCT elevation, no definite new infection.  NH3 recently checked was not high.    - Will repeat head CT.  - Wife present to re-orient.  67.  H/o CVA: Has history of right MCA territory CVA.  14. Now DNR/limited code - Successfully extubated 07/25/17.   - Family would not want CPR or intubation. Pressors ok   Will hold off on invasive procedures for now, need mental status recovery and considerable rehab.  Will have PT see him today.   CRITICAL CARE Performed by: Loralie Champagne  Total critical care time: 35 minutes  Critical care time was exclusive of separately billable procedures and treating other patients.  Critical care was necessary to treat or prevent imminent or life-threatening deterioration.  Critical care was time spent personally by me on the following activities: development of treatment plan with patient and/or surrogate as well as nursing, discussions with consultants, evaluation of patient's response to treatment, examination of patient, obtaining history from patient or surrogate, ordering and performing treatments and interventions, ordering and review of laboratory studies, ordering and review of radiographic studies, pulse oximetry and re-evaluation of patient's condition.  Length of Stay: Brent, MD  08/02/2017, 8:46  AM  Advanced Heart Failure Team Pager [phone] (M-F; 7a - 4p)  Please contact [facility] for night-coverage after hours (4p -7a ) and weekends on [email or web address]

## 2017-08-02 NOTE — Progress Notes (Signed)
Palliative Medicine RN Note: Rec'd call from Mrs. Mohamud. She is very upset that PMT is "not helping" Mr. Kantner. She expressed frustration about a test or procedure patient is due to get today. Mrs. Jarvie does NOT want PMT involved in her husband's care anymore and does NOT want our providers to visit. Per pt's RN, he is getting TEE today.  At family's request we will no longer see this patient. If Mrs Villavicencio specifically requests our re-engagement, please reconsult [place]. Otherwise, we will sign off.  Marjie Skiff Lamya Lausch, RN, BSN, [facility] 08/02/2017 9:14 AM Cell [phone] 8:00-4:00 Monday-Friday Office [phone]

## 2017-08-02 NOTE — Consult Note (Signed)
Physical Medicine and Rehabilitation Consult Reason for Consult: Decreased functional mobility secondary to acute hypoxic respiratory failure Referring Physician: Dr. Algernon Huxley   HPI: Brandon [name] is a 71 y.o. right handed male with history of CVA, hypertension, aortic stenosis, tobacco abuse, [name] and recent rotator cuff surgery 05/18/2017.  Presented 07/13/2017 for nonspecific chest pain, diaphoresis elevated troponin 0.12. Patient lives with spouse reported to be independent prior to admission. One level home with 1 step to entry. Chest x-ray with interval development of pulmonary edema. Treated with intravenous Lasix Lopressor added for bouts of atrial fibrillation with RVR. He did develop some agitation received Xanax as well as Haldol. Echocardiogram with ejection fraction of 06% grade 2 diastolic dysfunction. Patient did require intubation for acute hypoxic respiratory failure 07/14/2017. TEE showed moderate LVH, moderate AI with moderate to severe mitral regurgitation. Venous Dopplers lower extremities negative for DVT. He was extubated 07/25/2017. Ongoing monitoring of mental status with cranial CT scan today 08/02/2017 pending.. Palliative care was consulted to establish goals of care. A nasogastric tube is in place for nutritional support. Physical therapy evaluation completed and ongoing noting debilitation. The clarification of weightbearing status of left upper extremity after rotator cuff surgery. M.D. has requested physical medicine rehabilitation consult.  According to wife, patient had full recovery from prior stroke. She has noticed increasing weakness of the right upper extremity, CT scan performed this morning showed no acute infarct. Patient hospitalized since 07/13/2017 and was intubated for greater than 1 week, very poor vocal volume since extubation   Review of Systems  Constitutional: Negative for chills and fever.  HENT: Negative for hearing loss.     Eyes: Negative for blurred vision and double vision.  Respiratory: Positive for cough and shortness of breath.   Cardiovascular: Positive for chest pain, palpitations and leg swelling.  Gastrointestinal: Positive for constipation. Negative for nausea.       GERD  Genitourinary: Positive for urgency. Negative for dysuria, flank pain and hematuria.  Musculoskeletal: Positive for joint pain and myalgias.  Skin: Negative for rash.  Neurological: Positive for weakness.  Psychiatric/Behavioral: Positive for depression. The patient has insomnia.        Anxiety  All other systems reviewed and are negative.  Past Medical History:  Diagnosis Date  . Anemia 07/13/2017  . Anxiety   . Aortic insufficiency   . Aortic stenosis, moderate 07/13/2017  . Arthritis    back   . Carotid stenosis    Right carotid stent (widely patent) 40 - 59% left plaque 11/13  . Depression   . Dyslipidemia   . GERD (gastroesophageal reflux disease)   . Heart murmur   . Hemiplegia affecting unspecified side, late effect of cerebrovascular disease    resolved- from L side   . Hypertension   . Jaundice    resolved following ERCP & Cholecystectomy  . Mitral valve insufficiency and aortic valve insufficiency   . Pre-diabetes    per spouse  . Sleep apnea    does not wear CPAP  . Sleep concern    resulted in surgery- after + sleep test. Pt. doesn't have a problem any longer.   . Stroke ([facility]) 03/11/2003   stent placed on the 31, 3, 2004, L side   . Wears glasses   . Wears hearing aid in both ears   . Wears partial dentures    Past Surgical History:  Procedure Laterality Date  . BACK SURGERY     lumbar back  .  CHOLECYSTECTOMY    . ERCP N/A 05/31/2013   Procedure: ENDOSCOPIC RETROGRADE CHOLANGIOPANCREATOGRAPHY (ERCP);  Surgeon: Ladene Artist, MD;  Location: [facility];  Service: Endoscopy;  Laterality: N/A;  . FOOT SURGERY     right  . LAPAROSCOPIC CHOLECYSTECTOMY SINGLE PORT N/A 06/01/2013   Procedure:  LAPAROSCOPIC CHOLECYSTECTOMY SINGLE PORT;  Surgeon: Adin Hector, MD;  Location: [facility];  Service: General;  Laterality: N/A;  . POLYPECTOMY    . SHOULDER ARTHROSCOPY WITH ROTATOR CUFF REPAIR AND SUBACROMIAL DECOMPRESSION Left 05/18/2017  . SHOULDER ARTHROSCOPY WITH ROTATOR CUFF REPAIR AND SUBACROMIAL DECOMPRESSION Left 05/18/2017   Procedure: SHOULDER ARTHROSCOPY WITH ROTATOR CUFF REPAIR AND SUBACROMIAL DECOMPRESSION;  Surgeon: Tania Ade, MD;  Location: [facility];  Service: Orthopedics;  Laterality: Left;  LEFT SHOULDER ARTHROSCOPY WITH ROTATOR CUFF REPAIR AND SUBACROMIAL DECOMPRESSION  . TEE WITHOUT CARDIOVERSION N/A 05/09/2017   Procedure: TRANSESOPHAGEAL ECHOCARDIOGRAM (TEE);  Surgeon: Skeet Latch, MD;  Location: [facility] ENDOSCOPY;  Service: Cardiovascular;  Laterality: N/A;  . TONSILLECTOMY     Family History  Problem Relation Age of Onset  . Stroke Father        No details  . Angina Mother    Social History:  reports that he has been smoking Cigarettes.  He has a 28.50 pack-year smoking history. He has never used smokeless tobacco. He reports that he drinks about 1.2 - 1.8 oz of alcohol per week . He reports that he does not use drugs. Allergies:  Allergies  Allergen Reactions  . Bee Venom Anaphylaxis and Swelling   Medications Prior to Admission  Medication Sig Dispense Refill  . aspirin EC 81 MG tablet Take 81 mg by mouth daily.      [name] aspirin-sod bicarb-citric acid (ALKA-SELTZER) 325 MG TBEF tablet Take 650 mg by mouth daily as needed (indigestion).    [name] buPROPion (WELLBUTRIN SR) 150 MG 12 hr tablet Take 150 mg by mouth 2 (two) times daily.     . calcium carbonate (TUMS - DOSED IN MG ELEMENTAL CALCIUM) 500 MG chewable tablet Chew 2 tablets by mouth daily as needed for indigestion or heartburn.    . citalopram (CELEXA) 20 MG tablet Take 20 mg by mouth daily before breakfast.     . EPIPEN 2-PAK 0.3 MG/0.3ML SOAJ injection Inject 0.3 mg into the muscle once as needed (allergic  reaction).     [name] lisinopril (PRINIVIL,ZESTRIL) 20 MG tablet Take 20 mg by mouth at bedtime.     . lovastatin (MEVACOR) 20 MG tablet Take 1 tablet (20 mg total) by mouth at bedtime. 90 tablet 2  . Multiple Vitamins-Minerals (ONE-A-DAY MENS 50+ ADVANTAGE) TABS Take 1 tablet by mouth daily with breakfast.    . omeprazole (PRILOSEC) 20 MG capsule Take 20 mg by mouth at bedtime.     [name] OVER THE COUNTER MEDICATION Hyland's Restful Legs tablets (AM/PM formulations): Take 1 tablet by mouth two times a day as needed for restless legs    . Pseudoeph-Doxylamine-DM-APAP (NYQUIL PO) Take 1 Dose by mouth at bedtime as needed (cough).    . docusate sodium (COLACE) 100 MG capsule Take 1 capsule (100 mg total) by mouth 3 (three) times daily as needed. (Patient not taking: Reported on 07/13/2017) 20 capsule 0  . oxyCODONE-acetaminophen (ROXICET) 5-325 MG tablet Take 1-2 tablets by mouth every 4 (four) hours as needed for severe pain. (Patient not taking: Reported on 07/13/2017) 40 tablet 0    Home: Home Living Family/patient expects to be discharged to:: Private residence Living Arrangements:  Spouse/significant other Available Help at Discharge: Family, Friend(s), Available 24 hours/day Type of Home: House Home Access: Stairs to enter [employer] of Steps: 1 + 1 Entrance Stairs-Rails: None Home Layout: One level Bathroom Shower/Tub: Tub/shower unit, Architectural technologist: Standard Bathroom Accessibility: Yes Home Equipment: None  Functional History: Prior Function Level of Independence: Independent Comments: Pt was fully independent with ADLs including yard work.  He recently underwent Lt partial repair RTC,per wife (June 2018), and was still undergoing therapy - Dr Tamera Punt  Functional Status:  Mobility: Bed Mobility Overal bed mobility: Needs Assistance Bed Mobility: Supine to Sit Supine to sit: Max assist, +2 for physical assistance, Mod assist General bed mobility comments: Pt needed  assist to move LEs off bed and for elevation of trunk. Used pad to scoot pt to EOB.  Takes a few moments for pt to attain upright sitting needing max assist inittially to maintain sitting and progressing to min to min guard assist.  Transfers Overall transfer level: Needs assistance Equipment used:  Charlaine Dalton) Transfer via Lift Equipment: Stedy Transfers: Sit to/from Stand Sit to Stand: Max assist, +2 physical assistance, From elevated surface, Mod assist  Lateral/Scoot Transfers: Mod assist, +2 physical assistance, Max assist General transfer comment: Pt needed mod to max assist to power up and pulled up on Stedy.  took incr time to come to full stand needing constant cuing but did achieve standing for 1 min while nurse cleaning pt as he has BM smear.  Pt needed a sitting rest break on the [place].  VSS.  Moved STedy to recliner and pt stood  once more with pt standing  fully upright 15 seconds and then pt with controlled descent into recliner.          ADL: ADL Overall ADL's : Needs assistance/impaired Eating/Feeding: NPO Grooming: Wash/dry hands, Oral care, Wash/dry face, Brushing hair, Maximal assistance, Sitting Upper Body Bathing: Maximal assistance, Sitting Lower Body Bathing: Total assistance, Bed level Upper Body Dressing : Total assistance, Sitting Lower Body Dressing: Total assistance, Bed level Toilet Transfer: Maximal assistance, +2 for physical assistance, Stand-pivot (stedy ) Toileting- Clothing Manipulation and Hygiene: Sit to/from stand, Total assistance Toileting - Clothing Manipulation Details (indicate cue type and reason): Pt incontinent of stool, and was assisted with peri care  Functional mobility during ADLs: Maximal assistance, +2 for physical assistance (stedy )  Cognition: Cognition Overall Cognitive Status: Impaired/Different from baseline Orientation Level: Oriented to person Cognition Arousal/Alertness: Awake/alert Behavior During Therapy: Flat affect Overall  Cognitive Status: Impaired/Different from baseline Area of Impairment: Orientation, Following commands, Safety/judgement, Awareness, Problem solving, Memory Orientation Level: Disoriented to, Time, Situation, Place Memory: Decreased short-term memory Following Commands: Follows one step commands with increased time Safety/Judgement: Decreased awareness of safety, Decreased awareness of deficits Awareness: Intellectual Problem Solving: Slow processing, Decreased initiation, Difficulty sequencing, Requires verbal cues, Requires tactile cues  Blood pressure 130/70, pulse 80, temperature 97.7 F (36.5 C), resp. rate 19, height 6\' 2"  (1.88 m), weight 105.8 kg (233 lb 4 oz), SpO2 97 %. Physical Exam  Vitals reviewed. Constitutional:  71 year old right handed ill appearing male  HENT:  Nasogastric tube in place  Eyes:  Pupils reactive to light  Neck: Normal range of motion. Neck supple. No thyromegaly present.  Cardiovascular:  Cardiac rate control  Respiratory:  Decreased breath sounds at the bases  GI: Soft. Bowel sounds are normal. He exhibits no distension.  Neurological: He is alert.  Patient is somewhat anxious. He is hard of hearing. Voices very hoarse. He  was able to provide his name only. Follows some simple commands  Skin: Skin is warm and dry.  Motor strength is 3 minus in the right deltoid, bicep, tricep, 2 minus at the finger flexors and extensors. Left upper extremity has at least 3/5 strength in the deltoid, bicep, tricep, finger flexors and extensors, resistance testing not performed secondary to recent rotator cuff surgery. Lower extremity strength is 3 minus at the hip flexors, knee extensors, 2 minus at the ankle dorsiflexors. Sensation difficult to assess secondary to his communication he does with yes no responses indicate that he has equal sensation to light touch and pinch in both upper and lower extremities. Mild right facial droop.   Results for orders placed or  performed during the hospital encounter of 07/13/17 (from the past 24 hour(s))  Glucose, capillary     Status: Abnormal   Collection Time: 08/01/17 12:10 PM  Result Value Ref Range   Glucose-Capillary 120 (H) 65 - 99 mg/dL   Comment 1 Notify RN   Glucose, capillary     Status: Abnormal   Collection Time: 08/01/17  4:37 PM  Result Value Ref Range   Glucose-Capillary 124 (H) 65 - 99 mg/dL   Comment 1 Notify RN   Glucose, capillary     Status: None   Collection Time: 08/01/17  8:00 PM  Result Value Ref Range   Glucose-Capillary 90 65 - 99 mg/dL   Comment 1 Capillary Specimen   Glucose, capillary     Status: Abnormal   Collection Time: 08/01/17 11:04 PM  Result Value Ref Range   Glucose-Capillary 113 (H) 65 - 99 mg/dL   Comment 1 Capillary Specimen   Glucose, capillary     Status: Abnormal   Collection Time: 08/02/17  4:13 AM  Result Value Ref Range   Glucose-Capillary 104 (H) 65 - 99 mg/dL   Comment 1 Notify RN    Comment 2 Document in Chart   Heparin level (unfractionated)     Status: None   Collection Time: 08/02/17  4:20 AM  Result Value Ref Range   Heparin Unfractionated 0.46 0.30 - 0.70 IU/mL  CBC with Differential/Platelet     Status: Abnormal   Collection Time: 08/02/17  4:20 AM  Result Value Ref Range   WBC 10.2 4.0 - 10.5 K/uL   RBC 2.75 (L) 4.22 - 5.81 MIL/uL   Hemoglobin 8.1 (L) 13.0 - 17.0 g/dL   HCT 26.9 (L) 39.0 - 52.0 %   MCV 97.8 78.0 - 100.0 fL   MCH 29.5 26.0 - 34.0 pg   MCHC 30.1 30.0 - 36.0 g/dL   RDW 18.4 (H) 11.5 - 15.5 %   Platelets 236 150 - 400 K/uL   Neutrophils Relative % 83 %   Neutro Abs 8.4 (H) 1.7 - 7.7 K/uL   Lymphocytes Relative 9 %   Lymphs Abs 0.9 0.7 - 4.0 K/uL   Monocytes Relative 6 %   Monocytes Absolute 0.7 0.1 - 1.0 K/uL   Eosinophils Relative 2 %   Eosinophils Absolute 0.2 0.0 - 0.7 K/uL   Basophils Relative 0 %   Basophils Absolute 0.0 0.0 - 0.1 K/uL  Magnesium     Status: None   Collection Time: 08/02/17  4:20 AM  Result  Value Ref Range   Magnesium 2.2 1.7 - 2.4 mg/dL  Comprehensive metabolic panel     Status: Abnormal   Collection Time: 08/02/17  4:20 AM  Result Value Ref Range   Sodium 148 (H) 135 -  145 mmol/L   Potassium 3.7 3.5 - 5.1 mmol/L   Chloride 115 (H) 101 - 111 mmol/L   CO2 26 22 - 32 mmol/L   Glucose, Bld 108 (H) 65 - 99 mg/dL   BUN 45 (H) 6 - 20 mg/dL   Creatinine, Ser 1.21 0.61 - 1.24 mg/dL   Calcium 8.5 (L) 8.9 - 10.3 mg/dL   Total Protein 5.7 (L) 6.5 - 8.1 g/dL   Albumin 2.3 (L) 3.5 - 5.0 g/dL   AST 32 15 - 41 U/L   ALT 47 17 - 63 U/L   Alkaline Phosphatase 46 38 - 126 U/L   Total Bilirubin 1.4 (H) 0.3 - 1.2 mg/dL   GFR calc non Af Amer 58 (L) >60 mL/min   GFR calc Af Amer >60 >60 mL/min   Anion gap 7 5 - 15  Glucose, capillary     Status: None   Collection Time: 08/02/17  7:47 AM  Result Value Ref Range   Glucose-Capillary 95 65 - 99 mg/dL   Comment 1 Notify RN    Dg Chest Port 1 View  Result Date: 08/01/2017 CLINICAL DATA:  Cough.  Shortness of breath. EXAM: PORTABLE CHEST 1 VIEW COMPARISON:  Chest x-ray 07/30/2017. FINDINGS: PICC line noted with tip projected superior vena cava. Feeding tube noted with tip below left hemidiaphragm. Lines and tubes in stable position. Cardiomegaly with diffuse bilateral pulmonary infiltrates/edema, unchanged from prior exam. No pleural effusion or pneumothorax. IMPRESSION: 1. PICC line noted with tip projected over superior vena cava. Feeding tube noted with tip below left hemidiaphragm. Lines and tubes in stable position. 2. Cardiomegaly with diffuse bilateral pulmonary infiltrates/ edema, unchanged from prior exam. Electronically Signed   By: Pope   On: 08/01/2017 09:24    Assessment/Plan: Diagnosis: Debility with more recent onset right upper extremity weakness, CT negative for infarct, however, may have brainstem infarct, other differential includes critical illness neuropathy/myopathy, given that he has bilateral lower extremity  weakness as well 1. Does the need for close, 24 hr/day medical supervision in concert with the patient's rehab needs make it unreasonable for this patient to be served in a less intensive setting? Yes 2. Co-Morbidities requiring supervision/potential complications: History of prior CVA, hypertension, aortic stenosis, left rotator cuff repair 05/18/2017, history A. fib, RVR 3. Due to bladder management, bowel management, safety, skin/wound care, disease management, medication administration, pain management and patient education, does the patient require 24 hr/day rehab nursing? Yes 4. Does the patient require coordinated care of a physician, rehab nurse, PT (1-2 hrs/day, 5 days/week), OT (1-2 hrs/day, 5 days/week) and SLP (.5-1 hrs/day, 5 days/week) to address physical and functional deficits in the context of the above medical diagnosis(es)? Yes Addressing deficits in the following areas: balance, endurance, locomotion, strength, transferring, bowel/bladder control, bathing, dressing, feeding, grooming, toileting, cognition, speech, language, swallowing and psychosocial support 5. Can the patient actively participate in an intensive therapy program of at least 3 hrs of therapy per day at least 5 days per week? Yes 6. The potential for patient to make measurable gains while on inpatient rehab is fair 7. Anticipated functional outcomes upon discharge from inpatient rehab are min assist and mod assist  with PT, min assist and mod assist with OT, min assist and mod assist with SLP. 8. Estimated rehab length of stay to reach the above functional goals is: 21 days to 25d 9. Anticipated D/C setting: Home versus SNF 10. Anticipated post D/C treatments: [place] therapy 11. Overall Rehab/Functional Prognosis:  fair  RECOMMENDATIONS: This patient's condition is appropriate for continued rehabilitative care in the following setting: CIR Patient has agreed to participate in recommended program. Potentially Note that  insurance prior authorization may be required for reimbursement for recommended care.  Comment: Needs further workup and neurology consult for etiology of weakness   ANGIULLI,DANIEL J., PA-C 08/02/2017

## 2017-08-02 NOTE — Progress Notes (Signed)
ANTICOAGULATION CONSULT NOTE - Follow Up Consult  Pharmacy Consult for Heparin  Indication: atrial fibrillation  Allergies  Allergen Reactions  . Bee Venom Anaphylaxis and Swelling    Patient Measurements: Height: 6\' 2"  (188 cm) Weight: 233 lb 4 oz (105.8 kg) IBW/kg (Calculated) : 82.2   Vital Signs: BP: 130/70 (08/22 0700) Pulse Rate: 80 (08/22 0700)  Labs:  Recent Labs  07/31/17 0636 08/01/17 0432 08/02/17 0420  HGB 8.8* 7.8* 8.1*  HCT 28.6* 26.4* 26.9*  PLT 209 213 236  HEPARINUNFRC 0.26* 0.44 0.46  CREATININE 1.16 1.19 1.21    Estimated Creatinine Clearance: 72.5 mL/min (by C-G formula based on SCr of 1.21 mg/dL).  . sodium chloride    . feeding supplement (JEVITY 1.2 CAL) Stopped (08/02/17 0001)  . heparin 1,600 Units/hr (08/02/17 0600)     Assessment: 71 y/o M continues on heparin drip for afib. Level this morning came back slightly below goal at 0.46 on heparin drip 1600 uts/hr. Hgb back up a bit today to 8.1 s/p PRBCs over weekend, no overt bleeding noted.   Goal of Therapy:  Heparin level 0.3-0.7 units/ml Monitor platelets by anticoagulation protocol: Yes   Plan:  Continue heparin at 1600 units / hr  Daily heparin level and CBC F/u ability to start [place] eventually.  Uvaldo Rising, BCPS  Clinical Pharmacist Pager [phone]  08/02/2017 11:19 AM

## 2017-08-03 ENCOUNTER — Encounter (HOSPITAL_COMMUNITY)
Admission: EM | Disposition: A | Discharge status: Rehabilitation Facility | Payer: Self-pay | Source: Home / Self Care | Attending: Cardiovascular Disease

## 2017-08-03 LAB — CBC WITH DIFFERENTIAL/PLATELET
BASOS ABS: 0 10*3/uL (ref 0.0–0.1)
BASOS PCT: 0 %
EOS PCT: 1 %
Eosinophils Absolute: 0.1 10*3/uL (ref 0.0–0.7)
HCT: 24.6 % — ABNORMAL LOW (ref 39.0–52.0)
Hemoglobin: 7.4 g/dL — ABNORMAL LOW (ref 13.0–17.0)
LYMPHS PCT: 9 %
Lymphs Abs: 1.1 10*3/uL (ref 0.7–4.0)
MCH: 29.4 pg (ref 26.0–34.0)
MCHC: 30.1 g/dL (ref 30.0–36.0)
MCV: 97.6 fL (ref 78.0–100.0)
Monocytes Absolute: 0.8 10*3/uL (ref 0.1–1.0)
Monocytes Relative: 7 %
NEUTROS ABS: 10.2 10*3/uL — AB (ref 1.7–7.7)
Neutrophils Relative %: 83 %
PLATELETS: 283 10*3/uL (ref 150–400)
RBC: 2.52 MIL/uL — AB (ref 4.22–5.81)
RDW: 18.4 % — ABNORMAL HIGH (ref 11.5–15.5)
WBC: 12.3 10*3/uL — AB (ref 4.0–10.5)

## 2017-08-03 LAB — COMPREHENSIVE METABOLIC PANEL WITH GFR
ALT: 50 U/L (ref 17–63)
AST: 42 U/L — ABNORMAL HIGH (ref 15–41)
Albumin: 2.2 g/dL — ABNORMAL LOW (ref 3.5–5.0)
Alkaline Phosphatase: 49 U/L (ref 38–126)
Anion gap: 6 (ref 5–15)
BUN: 44 mg/dL — ABNORMAL HIGH (ref 6–20)
CO2: 27 mmol/L (ref 22–32)
Calcium: 8.4 mg/dL — ABNORMAL LOW (ref 8.9–10.3)
Chloride: 110 mmol/L (ref 101–111)
Creatinine, Ser: 1.22 mg/dL (ref 0.61–1.24)
GFR calc Af Amer: >60 mL/min
GFR calc non Af Amer: 58 mL/min — ABNORMAL LOW
Glucose, Bld: 130 mg/dL — ABNORMAL HIGH (ref 65–99)
Potassium: 3.2 mmol/L — ABNORMAL LOW (ref 3.5–5.1)
Sodium: 143 mmol/L (ref 135–145)
Total Bilirubin: 1.2 mg/dL (ref 0.3–1.2)
Total Protein: 5.6 g/dL — ABNORMAL LOW (ref 6.5–8.1)

## 2017-08-03 LAB — HEPARIN LEVEL (UNFRACTIONATED): HEPARIN UNFRACTIONATED: 0.54 [IU]/mL (ref 0.30–0.70)

## 2017-08-03 LAB — GLUCOSE, CAPILLARY
GLUCOSE-CAPILLARY: 122 mg/dL — AB (ref 65–99)
GLUCOSE-CAPILLARY: 114 mg/dL — AB (ref 65–99)
Glucose-Capillary: 99 mg/dL (ref 65–99)
Glucose-Capillary: 134 mg/dL — ABNORMAL HIGH (ref 65–99)
Glucose-Capillary: 106 mg/dL — ABNORMAL HIGH (ref 65–99)

## 2017-08-03 LAB — MAGNESIUM: Magnesium: 2.1 mg/dL (ref 1.7–2.4)

## 2017-08-03 SURGERY — RIGHT/LEFT HEART CATH AND CORONARY ANGIOGRAPHY
Anesthesia: LOCAL

## 2017-08-03 MED ORDER — APIXABAN 5 MG PO TABS
5.0000 mg | ORAL_TABLET | Freq: Two times a day (BID) | ORAL | Status: DC
  Administered 2017-08-03 – 2017-08-07 (×8): 5 mg
  Filled 2017-08-03 (×8): qty 1

## 2017-08-03 MED ORDER — PRO-STAT SUGAR FREE PO LIQD
30.0000 mL | Freq: Three times a day (TID) | ORAL | Status: DC
  Administered 2017-08-03 – 2017-08-07 (×12): 30 mL
  Filled 2017-08-03 (×12): qty 30

## 2017-08-03 MED ORDER — PRO-STAT SUGAR FREE PO LIQD: 30.0000 mL | Freq: Two times a day (BID) | ORAL | Status: DC

## 2017-08-03 MED ORDER — ORAL CARE MOUTH RINSE
15.0000 mL | Freq: Two times a day (BID) | OROMUCOSAL | Status: DC
Start: 2017-08-04 — End: 2017-08-07
  Administered 2017-08-04 – 2017-08-07 (×7): 15 mL via OROMUCOSAL

## 2017-08-03 MED ORDER — JEVITY 1.2 CAL PO LIQD
1000.0000 mL | ORAL | Status: DC
  Administered 2017-08-03 – 2017-08-04 (×2): 1000 mL
  Administered 2017-08-05: 19:00:00
  Administered 2017-08-06 – 2017-08-07 (×2): 1000 mL
  Filled 2017-08-03 (×9): qty 1000

## 2017-08-03 MED ORDER — CHLORHEXIDINE GLUCONATE 0.12 % MT SOLN
15.0000 mL | Freq: Two times a day (BID) | OROMUCOSAL | Status: DC
  Administered 2017-08-04 – 2017-08-07 (×7): 15 mL via OROMUCOSAL
  Filled 2017-08-03 (×7): qty 15

## 2017-08-03 MED ORDER — POTASSIUM CHLORIDE 20 MEQ/15ML (10%) PO SOLN
40.0000 meq | Freq: Once | ORAL | Status: AC
  Administered 2017-08-03: 40 meq via ORAL
  Filled 2017-08-03: qty 30

## 2017-08-03 MED ORDER — APIXABAN 5 MG PO TABS
5.0000 mg | ORAL_TABLET | Freq: Two times a day (BID) | ORAL | Status: DC
  Administered 2017-08-03: 5 mg via ORAL
  Filled 2017-08-03 (×2): qty 1

## 2017-08-03 MED ORDER — HEPARIN (PORCINE) IN NACL 100-0.45 UNIT/ML-% IJ SOLN: 1600.0000 [IU]/h | INTRAMUSCULAR | Status: DC

## 2017-08-03 MED ORDER — FUROSEMIDE 40 MG PO TABS
40.0000 mg | ORAL_TABLET | Freq: Every day | ORAL | Status: DC
  Administered 2017-08-03 – 2017-08-07 (×5): 40 mg
  Filled 2017-08-03 (×5): qty 1

## 2017-08-03 NOTE — Progress Notes (Signed)
Occupational Therapy Treatment Patient Details Name: Brandon [name] MRN: [id number] DOB: [birth year] Today's Date: 08/03/2017    History of present illness 71 y.o. heavy smoker admitted with acute hypoxic respiratory failure along with atrial fibrillation with rapid ventricular response. Patient was found to be in volume overload. With confusion occurring after admission and hypotension on Precedex in the setting of acute on chronic renal failure patient was intubated on 8/3. Intubated 8/3-8/14.     OT comments  Excellent participation today. Increased alertness and ability to follow 1 step commands. Focus of session on facilitation to trunk movement and BUE strengthening. Wife present for education. Continue to recommend CIR - pt will need 24/7 assistance after DC. Will contineu to follow to address established goals to facilitate DC to next venue of care.  Recommend nsg position pt in chair position in bed TID or OOB using lift.   Follow Up Recommendations  CIR;Supervision/Assistance - 24 hour    Equipment Recommendations  3 in 1 bedside commode    Recommendations for Other Services Rehab consult    Precautions / Restrictions Precautions Precautions: Shoulder;Fall Type of Shoulder Precautions: see note  Precaution Comments: Per Dr Tamera Punt (pt s/p Lt RCR early June) NWB Lt UE except to assist with standing and able to use RW; No pulling or pushing with Lt UE;  unrestricted PROM/AROM is okay.  No strengthening         Mobility Bed Mobility    Max A              Transfers                      Balance      Pt using rails to help push self up to midline using RUE                                     ADL either performed or assessed with clinical judgement   ADL       Grooming: Moderate assistance;Wash/dry face;Sitting;Bed level                                 General ADL Comments: Focu on hand to mouth and hand to head  pattern with RUE in preparation for ADL Educated wife to work with pt on hand to mouth/hand to ear movement patterns  In preparation for ADL    Vision    wears glasses   Perception     Praxis      Cognition Arousal/Alertness: Awake/alert Behavior During Therapy: Flat affect Overall Cognitive Status: Impaired/Different from baseline Area of Impairment: Orientation;Attention;Memory;Following commands;Safety/judgement;Awareness;Problem solving                 Orientation Level: Disoriented to;Time;Situation Current Attention Level: Sustained Memory: Decreased recall of precautions;Decreased short-term memory Following Commands: Follows one step commands consistently Safety/Judgement: Decreased awareness of safety;Decreased awareness of deficits Awareness: Intellectual Problem Solving: Slow processing;Decreased initiation;Difficulty sequencing General Comments: improvement in cognition today.        Exercises Exercises: Other exercises General Exercises - Upper Extremity Shoulder Flexion: AAROM;Left;20 reps;Supine Shoulder ABduction: AAROM;Left;Supine;Other (comment);20 reps Elbow Flexion: AROM;Right;20 reps Elbow Extension: AROM;Left;20 reps Other Exercises Other Exercises: Reaching across midline with BUE to facilitate core adn strengthen trunk x 15 each side Other Exercises: bridging BLE x 10 in preparation forLB ADL Other Exercises:  grip strengthening x 20 B hands   Shoulder Instructions       General Comments      Pertinent Vitals/ Pain       Pain Assessment: Faces Faces Pain Scale: No hurt  Home Living                                          Prior Functioning/Environment              Frequency  Min 2X/week        Progress Toward Goals  OT Goals(current goals can now be found in the care plan section)  Progress towards OT goals: Progressing toward goals  Acute Rehab OT Goals Patient Stated Goal: to get better and get  home to my hot tub OT Goal Formulation: With patient/family Time For Goal Achievement: 08/11/17 Potential to Achieve Goals: Good ADL Goals Pt Will Perform Grooming: with set-up;with supervision;sitting Pt Will Perform Upper Body Bathing: with min assist;sitting Pt Will Perform Upper Body Dressing: with mod assist;sitting Pt Will Transfer to Toilet: with mod assist;stand pivot transfer;bedside commode Additional ADL Goal #1: Pt will actively participate in 20 mins therapeutic activity with no more than 3 rest breaks and DOE no > 3/4   Plan Discharge plan remains appropriate    Co-evaluation                 AM-PAC PT "6 Clicks" Daily Activity     Outcome Measure   Help from another person eating meals?: Total Help from another person taking care of personal grooming?: A Lot Help from another person toileting, which includes using toliet, bedpan, or urinal?: Total Help from another person bathing (including washing, rinsing, drying)?: A Lot Help from another person to put on and taking off regular upper body clothing?: Total Help from another person to put on and taking off regular lower body clothing?: Total 6 Click Score: 8    End of Session Equipment Utilized During Treatment: Oxygen  OT Visit Diagnosis: Muscle weakness (generalized) (M62.81);Other symptoms and signs involving cognitive function   Activity Tolerance Patient tolerated treatment well   Patient Left in chair;Other (comment) (pt in chair position)   Nurse Communication Mobility status;Other (comment);Precautions        Time: 4431-5400 OT Time Calculation (min): 31 min  Charges: OT General Charges $OT Visit: 1 Procedure OT Treatments $Therapeutic Activity: 23-37 mins  [employer], OT/L  [phone] 08/03/2017   Marcellous Snarski,HILLARY 08/03/2017, 1:29 PM

## 2017-08-03 NOTE — Progress Notes (Signed)
Patient ID: Brandon [name], male   DOB: 12-Oct-1946, 71 y.o.   MRN: [id number]     Advanced Heart Failure Rounding Note   Subjective:    8/3 developed progressive respiratory distress => Bipap => intubation.  With sedation, became hypotensive and bradycardic, dopamine + phenylephrine started.  Developed AKI. Hyperkalemia treated with HCO3 gtt and Kayexalate.  Also with metabolic acidosis with elevated lactate (HCO3 gtt begun).  LFTs rose to the 1000s range.  Extubated 07/25/17.  Cor-Track placed 07/26/17, getting tube feeds.   CT head (8/23) with old stroke on right, nothing acute.   He was more confused agitated overnight, restrained.  Today, he is alert and follows commands.  Oriented to place/person, difficult to understand likely due to vocal cord impairment from intubation.   He diuresed well yesterday with IV Lasix, CVP down to 8 today.   He remains in NSR.    Antimicrobials this admission: 8/9 Cefepime >>8/12 8/10 Vancomycin >>8/12 8/12 Cipro >> 8/18  Microbiology results: 8/2 MRSA: negative 8/3 BCx: NGTD 8/8 TA: few S. Aureus, few Enterobacter - both S to cipro  Studies:  ECHO (5/18): EF 55-60%, moderate AS, moderate AI, moderate MR.   ECHO (8/18): EF 55-60%, moderate LVH, moderate AS mean 33, moderate AI, moderate-severe MR  Objective:   Weight Range: 222 lb 3.6 oz (100.8 kg) Body mass index is 28.53 kg/m.   Vital Signs:   Temp:  [98.2 F (36.8 C)-100.1 F (37.8 C)] 98.3 F (36.8 C) (08/23 0738) Pulse Rate:  [36-97] 75 (08/23 0700) Resp:  [13-25] 20 (08/23 0700) BP: (86-156)/(48-126) 134/66 (08/23 0700) SpO2:  [70 %-100 %] 99 % (08/23 0700) Weight:  [222 lb 3.6 oz (100.8 kg)] 222 lb 3.6 oz (100.8 kg) (08/23 0500) Last BM Date: 07/31/17  Weight change: Filed Weights   08/01/17 0400 08/02/17 0500 08/03/17 0500  Weight: 239 lb 3.2 oz (108.5 kg) 233 lb 4 oz (105.8 kg) 222 lb 3.6 oz (100.8 kg)   Intake/Output:   Intake/Output Summary (Last 24 hours) at  08/03/17 0855 Last data filed at 08/03/17 0600  Gross per 24 hour  Intake             1470 ml  Output             5195 ml  Net            -3725 ml    Physical Exam   CVP 8 General: Chronically ill appearing. NAD.   HEENT: Poor dentition. + Cortrak.  Neck: Supple. JVP 7-8 cm. Carotids 2+ bilat; no bruits. No thyromegaly or nodule noted. Cor: PMI nondisplaced. RRR, 2/6 crescendo-decrescendo systolic murmur RUSB.  Lungs: Clear anteriorly.   Abdomen: Soft, NT/ND. NoHSM. No bruits or masses. +BS  Extremities: No cyanosis, clubbing, or rash. No edema Neuro: Awake/alert, following commands.   Telemetry   Personally reviewed, NSR 70s-80s.    Labs    CBC  Recent Labs  08/02/17 0420 08/03/17 0618  WBC 10.2 12.3*  NEUTROABS 8.4* 10.2*  HGB 8.1* 7.4*  HCT 26.9* 24.6*  MCV 97.8 97.6  PLT 236 024   Basic Metabolic Panel  Recent Labs  08/01/17 0432 08/02/17 0420 08/03/17 0618  NA 150* 148* 143  K 4.1 3.7 3.2*  CL 118* 115* 110  CO2 '26 26 27  ' GLUCOSE 144* 108* 130*  BUN 48* 45* 44*  CREATININE 1.19 1.21 1.22  CALCIUM 8.3* 8.5* 8.4*  MG 2.4 2.2 2.1  PHOS 4.5  --   --  Liver Function Tests  Recent Labs  08/02/17 0420 08/03/17 0618  AST 32 42*  ALT 47 50  ALKPHOS 46 49  BILITOT 1.4* 1.2  PROT 5.7* 5.6*  ALBUMIN 2.3* 2.2*   No results for input(s): LIPASE, AMYLASE in the last 72 hours. Cardiac Enzymes No results for input(s): CKTOTAL, CKMB, CKMBINDEX, TROPONINI in the last 72 hours.  BNP: BNP (last 3 results)  Recent Labs  07/13/17 1341  BNP 408.4*    ProBNP (last 3 results) No results for input(s): PROBNP in the last 8760 hours.   D-Dimer No results for input(s): DDIMER in the last 72 hours. Hemoglobin A1C No results for input(s): HGBA1C in the last 72 hours. Fasting Lipid Panel No results for input(s): CHOL, HDL, LDLCALC, TRIG, CHOLHDL, LDLDIRECT in the last 72 hours. Thyroid Function Tests No results for input(s): TSH, T4TOTAL, T3FREE,  THYROIDAB in the last 72 hours.  Invalid input(s): FREET3  Other results:   Imaging    Ct Head Wo Contrast  Result Date: 08/02/2017 CLINICAL DATA:  Stroke follow-up EXAM: CT HEAD WITHOUT CONTRAST TECHNIQUE: Contiguous axial images were obtained from the base of the skull through the vertex without intravenous contrast. COMPARISON:  07/20/2017 FINDINGS: Brain: No evidence of acute infarction, hemorrhage, extra-axial collection, ventriculomegaly, or mass effect. Old large right MCA territory infarct with severe encephalomalacia. Generalized cerebral atrophy. Periventricular white matter low attenuation likely secondary to microangiopathy. Vascular: Cerebrovascular atherosclerotic calcifications are noted. Skull: Negative for fracture or focal lesion. Sinuses/Orbits: Visualized portions of the orbits are unremarkable. Visualized portions of the paranasal sinuses and mastoid air cells are unremarkable. Other: None. IMPRESSION: No acute intracranial pathology. Electronically Signed   By: Kathreen Devoid   On: 08/02/2017 10:55     Medications:     Scheduled Medications: . amiodarone  200 mg Per Tube BID  . apixaban  5 mg Oral BID  . chlorhexidine gluconate (MEDLINE KIT)  15 mL Mouth Rinse BID  . Chlorhexidine Gluconate Cloth  6 each Topical Daily  . feeding supplement (PRO-STAT SUGAR FREE 64)  30 mL Per Tube TID  . free water  300 mL Per Tube Q6H  . furosemide  40 mg Per Tube Daily  . insulin aspart  0-20 Units Subcutaneous Q4H  . mouth rinse  15 mL Mouth Rinse 10 times per day  . pantoprazole sodium  40 mg Per Tube Daily  . potassium chloride  40 mEq Oral Daily  . sodium chloride flush  10-40 mL Intracatheter Q12H  . sodium chloride flush  10-40 mL Intracatheter Q12H  . sodium chloride flush  3 mL Intravenous Q12H    Infusions: . sodium chloride    . feeding supplement (JEVITY 1.2 CAL) Stopped (08/02/17 0001)  . heparin      PRN Medications: sodium chloride, fentaNYL (SUBLIMAZE)  injection, glycopyrrolate, metoprolol tartrate, midazolam, nitroGLYCERIN, ondansetron (ZOFRAN) IV, sodium chloride flush, sodium chloride flush, sodium chloride flush    Patient Profile   71 yo with valvular heart disease and smoking/suspected COPD, CVA with carotid stenting was admitted with dyspnea/respiratory distress.   Assessment/Plan   1. Acute hypoxemic respiratory failure: Probable mix of pulmonary edema, PNA and COPD flare.  Extubated 07/25/17.  CVP 8 today. PCT not elevated, afebrile.  He had a course of prednisone and has completed abx.  He diuresed well yesterday, lungs sound better, and CVP down.  - Will make Lasix po today.   - Abx stopped 07/29/17. (10 day course  for suspected PNA => trach aspirate  with Enterobacter and Staph.  ) 2. Acute on chronic diastolic CHF: Baseline has at least moderate valvular disease (moderate AS, moderate AI, moderate-severe MR).  Suspect diastolic CHF triggered by poorly tolerated paroxysmal atrial fibrillation in setting of valvular disease.  Admission thought to be triggered by diastolic CHF/pulmonary edema.   He developed AKI with lactic acidosis in the setting of hypotension with sedation. CVP 8 today after good diuresis again yesterday. - Change Lasix to 40 mg po daily.    - Eventual RHC/LHC but will hold off for now given altered mental status and severe debilitation.  Will try to get to CIR, then arrange for TEE + RHC/LHC when he is more recovered.   3. Shock: Probably mixed cardiogenic/vasodilatory.  Developed in the setting of sedation with Bipap then intubation as well as bradycardia with sedation.  Resolved, has been off pressors.  4. AKI: With hyperkalemia (treated) in setting of shock. Suspect ATN.  Creatinine stable at 1.22 though BUN remains elevated.   5. Elevated LFTs: Improved, likely shock liver.  6. Atrial fibrillation: Paroxysmal. Remains in NSR with frequent PACs on po amio now. - Stop heparin gtt and start Eliquis.   7. Anemia:  Hgb lower today.  No active bleeding. FOBT negative 8/7. Transferrin saturation was 3% on 8/5. 1u RBCs on 8/17, had feraheme 8/21.  Transfuse hgb < 7.  8. ID: Blx CX NGTD. Suspected PNA.  On vancomycin/cefepime initially, then ciprofloxacin.  Stopped 07/30/17 after 10 day course. Wbc down.  PCT was not elevated 8/21. 9. COPD: Suspected.  Active smoker.  Was on prednisone to cover exacerbation. Steroids now complete 10. Valvular heart disease: Echo this admission with moderate AS, moderate AI, moderate to severe MR.  May eventually need consideration of surgical valve repair/replacement with Maze given poorly tolerated afib.  - Eventually should have TEE, but would like to see mental status improved. It is possible that this presentation is driven by valvular disease + atrial fibrillation triggering CHF.  He will need a lot of rehab before he is strong enough for heart surgery. As above, likely would get to CIR then when he has recovered arrange for TEE + LHC/RHC.  11. Hypernatremia: Na lower today. Getting free H2O.  12. Delirium: Better today.  Follows commands, moves all extremities.  Head CT yesterday with old right-sided CVA (remote), no acute changes.    - Wife present to re-orient.  36.  H/o CVA: Has history of right MCA territory CVA.  14. Now DNR/limited code - Successfully extubated 07/25/17.   - Family would not want CPR or intubation. Pressors ok  15. Deconditioning: Marked.  Needs much PT.  Will aim to get to CIR.   Can go to step-down today.    Length of Stay: [place], MD  08/03/2017, 8:55 AM  Advanced Heart Failure Team Pager [phone] (M-F; 7a - 4p)  Please contact [facility] for night-coverage after hours (4p -7a ) and weekends on [email or web address]

## 2017-08-03 NOTE — Progress Notes (Signed)
Pt foley bag leaking. RN changed bag and will continue to monitor for further issues. Leanne Chang, RN

## 2017-08-03 NOTE — Progress Notes (Signed)
Pt wife at bedside. Pt alert resting calmly, pt responding to wife nurturing support. Discussed with wife the use of less restrictive calming methods for pt , wife wanting to continue using soft waist belt at this time

## 2017-08-03 NOTE — Progress Notes (Signed)
Nutrition Follow-up  DOCUMENTATION CODES:   Obesity unspecified  INTERVENTION:   Tube Feeding:   Recommend increasing TF to  Jevity 1.2 at rate of 65 ml/hr, continue Pro-Stat 30 mL to TID.   Provides 2172 kcals, 132 g of protein and 1264 mL of free water  Total free water with current free water flushes: 2463 mL of free water  NUTRITION DIAGNOSIS:   Inadequate oral intake related to inability to eat as evidenced by NPO status.  Being addressed via TF  GOAL:   Patient will meet greater than or equal to 90% of their needs  Met  MONITOR:   TF tolerance, Skin, Labs  REASON FOR ASSESSMENT:   Consult Enteral/tube feeding initiation and management  ASSESSMENT:   Pt with PMH of smoker since age 4, HTN, PVD was admitted 8/2 with SOB and AF w RVR. Pt developed respiratory failure and was intubated 8/3.   8/15 rectal tube removed 8/18 ECHO EF 55-60%  Tolerating Jevity 1.2 at rate of 55 ml/hr with Pro-Stat 30 mL TID via Cortrak tube, free water flushes of 300 mL q 6 hours  SLP following, continue to recommend NPO status  Pt denies any N/V/D or abdominal pain  Weight trending down since admission. Current weight 222 pounds; weight on admission 250 pounds.  Pt net negative 4 L since admission per I/O flow sheet (4 kg/8.8 pounds). Wife mentions that she has notice that pt has lost weight since admission.  Labs: potassium 3.2 Meds: lasix, KCl  Diet Order:  Diet NPO time specified Except for: Ice Chips  Skin:  Wound (see comment) (DTI)  Last BM:  8/20  Height:   Ht Readings from Last 1 Encounters:  07/27/17 6' 2" (1.88 m)    Weight:   Wt Readings from Last 1 Encounters:  08/03/17 222 lb 3.6 oz (100.8 kg)    Ideal Body Weight:  86.3 kg  BMI:  Body mass index is 28.53 kg/m.  Estimated Nutritional Needs:   Kcal:  2000-2380  kcals  Protein:  115-140 g  Fluid:  >2 L/day  EDUCATION NEEDS:   No education needs identified at this time  [place],  [place], LDN [phone] Pager  [phone] Weekend/On-Call Pager

## 2017-08-03 NOTE — Progress Notes (Signed)
  Speech Language Pathology Treatment: Dysphagia  Patient Details Name: Brandon [name] MRN: [id number] DOB: [birth year] Today's Date: 08/03/2017 Time: 8329-1916 SLP Time Calculation (min) (ACUTE ONLY): 32 min  Assessment / Plan / Recommendation Clinical Impression  Pt completed his EMST exercises with Mod cueing to take a deep breath before each swallow. He did not appear to use a lot of effort in completing his exercises, but his wife says that he typically exerts more effort, therefore will keep his trainer set at the same setting for today. Will continue to reassess. Pt also consumed ice chips with intermittent coughing that was productive of mild to moderate amount of secretions. He had a baseline wet vocal quality that sounded clearer after secretion production. Immediate coughing followed trials of thin liquids. Recommend to remain NPO, continuing current exercise regimen. Pt can also have a few pieces of ice at a time after oral care in order to facilitate use of his swallowing musculature and assist in secretion clearance.    HPI HPI: 71 yo with valvular heart disease and smoking/suspected COPD, CVA with carotid stenting was admitted with dyspnea/respiratory distress 8/2. Found to be in atrial fibrillation with rapid ventricular response and volume overload. Placed on Bipap but required intubated 8/3-8/14.       SLP Plan  Continue with current plan of care       Recommendations  Diet recommendations: NPO;Other(comment) (can have a few ice chips at a time after oral care) Medication Administration: Via alternative means                Oral Care Recommendations: Oral care QID;Oral care prior to ice chip/H20 Follow up Recommendations: Inpatient Rehab SLP Visit Diagnosis: Dysphagia, pharyngeal phase (R13.13) Plan: Continue with current plan of care       GO                Germain Osgood 08/03/2017, 10:08 AM  Germain Osgood, M.A. CCC-SLP [phone]

## 2017-08-03 NOTE — Progress Notes (Addendum)
No safety sitter available per staffing office.Marland KitchenMarland KitchenMarland KitchenCalled wife to inform pt trying to get out of bed despite reorienting and reassuring pt, informed wife no agitation noted, informed wife pt bedside floor currently surrounded by safety mats. Discussed with wife prn med orders for agitation and pain, pt denies discomfort and no agitation noted, wife agrees with giving meds only for agitation and or pain. Discussed with wife family supervision or soft waist belt as safety options, wife agrees with applying soft waist belt to pt and comments she will come within the next 1-2 hours to sit at the pt bedside.

## 2017-08-03 NOTE — Progress Notes (Signed)
Inpatient Rehabilitation  Met with patient and wife at bedside to discuss team's recommendation for IP Rehab.  Shared booklets and answered questions.  Plan to follow for timing of medical readiness and therapy tolerance prior to initiation of insurance authorization.  Please call with questions.   Carmelia Roller., CCC/SLP Admission Coordinator  [place]  Cell [phone]

## 2017-08-03 NOTE — Progress Notes (Signed)
Pt alert with no change in confusion, pt trying to get out of bed despite reorienting and reassuring pt, pt denies discomfort, safety mats applied to both sides of bedside floor

## 2017-08-03 NOTE — Progress Notes (Signed)
ANTICOAGULATION CONSULT NOTE - Follow Up Consult  Pharmacy Consult for Heparin > Transition to Eliquis Indication: atrial fibrillation  Allergies  Allergen Reactions  . Bee Venom Anaphylaxis and Swelling    Patient Measurements: Height: 6\' 2"  (188 cm) Weight: 222 lb 3.6 oz (100.8 kg) IBW/kg (Calculated) : 82.2   Vital Signs: Temp: 98.5 F (36.9 C) (08/23 1201) Temp Source: Oral (08/23 1201) BP: 136/65 (08/23 1100) Pulse Rate: 65 (08/23 1100)  Labs:  Recent Labs  08/01/17 0432 08/02/17 0420 08/03/17 0500 08/03/17 0618  HGB 7.8* 8.1*  --  7.4*  HCT 26.4* 26.9*  --  24.6*  PLT 213 236  --  283  HEPARINUNFRC 0.44 0.46 0.54  --   CREATININE 1.19 1.21  --  1.22    Estimated Creatinine Clearance: 70.4 mL/min (by C-G formula based on SCr of 1.22 mg/dL).  . sodium chloride    . feeding supplement (JEVITY 1.2 CAL) Stopped (08/02/17 0001)     Assessment: 71 y/o M continues on heparin drip for afib. Heparin level at goal this AM.  No bleeding or complications noted, Hgb continues to drift down a bit.  Pharmacy asked to transition to Eliquis today.    Goal of Therapy:  Heparin level 0.3-0.7 units/ml Monitor platelets by anticoagulation protocol: Yes   Plan:  D/c IV heparin Start Eliquis 5 mg BID. Will educate patient and family about Eliquis prior to discharge.  Uvaldo Rising, BCPS  Clinical Pharmacist Pager [phone]  08/03/2017 12:23 PM

## 2017-08-03 NOTE — Care Management Note (Signed)
Case Management Note  Patient Details  Name: Brandon [name] MRN: [id number] Date of Birth: 07-04-1946  Subjective/Objective:     Pt admitted with SOB - required intubation             Action/Plan:   PTA from home with wife - pt has recent rotator cuff surgery.  Pt remains intubated.  CM will continue to follow for discharge needs   Expected Discharge Date:                  Expected Discharge Plan:  Home/Self Care  In-House Referral:     Discharge planning Services  CM Consult  Post Acute Care Choice:    Choice offered to:     DME Arranged:    DME Agency:     HH Arranged:    HH Agency:     Status of Service:     If discussed at [employer] of Stay Meetings, dates discussed:    Additional Comments: 07/20/17 08/03/2017  Discussed in LOS 8/23 - pt remains appropriate for continued stay.  Pt was a lateral move from [place] to 40M.  Pressors and Precedex were discontinued today.  Pt remains on IV heparin, tube feeds.  CIR and CSW continues to follow for discharge  07/27/17 Discussed in LOS 8/16 - pt remains appropriate for continued stay.  Pt was terminally extubated and rallied.  CIR is now recommended- CSW consulted for back up plan   07/19/17 Pt remains on the vent with wife at bedside.  Pt was completely independent prior to PTA.  CM will continue to follow for discharge needs Maryclare Labrador, RN 08/03/2017, 3:59 PM

## 2017-08-04 ENCOUNTER — Inpatient Hospital Stay (HOSPITAL_COMMUNITY): Payer: Medicare HMO

## 2017-08-04 DIAGNOSIS — G9341 Metabolic encephalopathy: Secondary | ICD-10-CM

## 2017-08-04 LAB — CBC WITH DIFFERENTIAL/PLATELET
BASOS ABS: 0 10*3/uL (ref 0.0–0.1)
BASOS PCT: 0 %
EOS PCT: 2 %
Eosinophils Absolute: 0.2 10*3/uL (ref 0.0–0.7)
HCT: 27.4 % — ABNORMAL LOW (ref 39.0–52.0)
Hemoglobin: 8.3 g/dL — ABNORMAL LOW (ref 13.0–17.0)
Lymphocytes Relative: 13 %
Lymphs Abs: 1.3 10*3/uL (ref 0.7–4.0)
MCH: 30.3 pg (ref 26.0–34.0)
MCHC: 30.3 g/dL (ref 30.0–36.0)
MCV: 100 fL (ref 78.0–100.0)
MONO ABS: 0.5 10*3/uL (ref 0.1–1.0)
Monocytes Relative: 5 %
Neutro Abs: 8.5 10*3/uL — ABNORMAL HIGH (ref 1.7–7.7)
Neutrophils Relative %: 80 %
PLATELETS: 289 10*3/uL (ref 150–400)
RBC: 2.74 MIL/uL — ABNORMAL LOW (ref 4.22–5.81)
RDW: 19.1 % — AB (ref 11.5–15.5)
WBC: 10.6 10*3/uL — ABNORMAL HIGH (ref 4.0–10.5)

## 2017-08-04 LAB — COMPREHENSIVE METABOLIC PANEL
ALK PHOS: 45 U/L (ref 38–126)
ALT: 50 U/L (ref 17–63)
ANION GAP: 6 (ref 5–15)
AST: 45 U/L — ABNORMAL HIGH (ref 15–41)
Albumin: 2.2 g/dL — ABNORMAL LOW (ref 3.5–5.0)
BUN: 38 mg/dL — ABNORMAL HIGH (ref 6–20)
CALCIUM: 8.5 mg/dL — AB (ref 8.9–10.3)
CHLORIDE: 112 mmol/L — AB (ref 101–111)
CO2: 26 mmol/L (ref 22–32)
Creatinine, Ser: 1.12 mg/dL (ref 0.61–1.24)
GFR calc non Af Amer: >60 mL/min (ref >60)
Glucose, Bld: 123 mg/dL — ABNORMAL HIGH (ref 65–99)
POTASSIUM: 4.4 mmol/L (ref 3.5–5.1)
SODIUM: 144 mmol/L (ref 135–145)
Total Bilirubin: 1 mg/dL (ref 0.3–1.2)
Total Protein: 5.5 g/dL — ABNORMAL LOW (ref 6.5–8.1)

## 2017-08-04 LAB — GLUCOSE, CAPILLARY
GLUCOSE-CAPILLARY: 130 mg/dL — AB (ref 65–99)
GLUCOSE-CAPILLARY: 115 mg/dL — AB (ref 65–99)
GLUCOSE-CAPILLARY: 114 mg/dL — AB (ref 65–99)
GLUCOSE-CAPILLARY: 90 mg/dL (ref 65–99)
Glucose-Capillary: 116 mg/dL — ABNORMAL HIGH (ref 65–99)
Glucose-Capillary: 105 mg/dL — ABNORMAL HIGH (ref 65–99)

## 2017-08-04 LAB — TSH: TSH: 3.239 u[IU]/mL (ref 0.350–4.500)

## 2017-08-04 LAB — MAGNESIUM: Magnesium: 2.1 mg/dL (ref 1.7–2.4)

## 2017-08-04 MED ORDER — QUETIAPINE FUMARATE 25 MG PO TABS: 12.5000 mg | ORAL_TABLET | Freq: Every evening | ORAL | Status: DC | PRN

## 2017-08-04 MED ORDER — MIDAZOLAM BOLUS VIA INFUSION
0.5000 mg | INTRAVENOUS | Status: DC | PRN
  Filled 2017-08-04: qty 1

## 2017-08-04 MED ORDER — POTASSIUM CHLORIDE 20 MEQ PO PACK
40.0000 meq | PACK | Freq: Once | ORAL | Status: AC
  Administered 2017-08-04: 40 meq via ORAL
  Filled 2017-08-04: qty 2

## 2017-08-04 MED ORDER — LORAZEPAM 2 MG/ML IJ SOLN
2.0000 mg | Freq: Once | INTRAMUSCULAR | Status: DC
  Filled 2017-08-04: qty 1

## 2017-08-04 NOTE — Progress Notes (Signed)
Pt currently having MRI done. Paged Dr Cheral Marker, E and notified. Recommend to cancel CT scan given MRI open to accommodate pt. Ct order D/C.  Brandon Dowson Ladora Daniel, RN

## 2017-08-04 NOTE — Progress Notes (Signed)
CSW continuing to follow for SNF backup to CIR. CSW aware of neuro consult and following for disposition planning.   Estanislado Emms, [place]

## 2017-08-04 NOTE — Consult Note (Addendum)
NEURO HOSPITALIST CONSULT NOTE   Requesting physician: Dr. Aundra Dubin  Reason for Consult: AMS  History obtained from:  Chart and wife  HPI:                                                                                                                                          Brandon [name] is an 71 y.o. male with a past medical history significant for stroke without deficits, hypertension, hyperlipidemia, multiple cardiac complaints including atrial fibrillation on Eliquis arrived to [facility] for an evaluation of chest pain, diaphoresis and elevated troponins. While in the ED, he developed pulmonary edema at afib. Once admitted, he was placed on bipap, became agitated and confusion. He was placed on precedex and provided with ativan and haldol for agitation.   On 07/14/2017, Dr. Titus Mould suggested that he had acute metabolic encephalopathy secondary to ?hypoxia, ?hypercarbia, ?medications. He was found to have sepsis later that day and intubated because of severe metabolic acidosis. He showed some improvement over time and was successfully extubated on 07/25/17. Brandon [name] continued to become more alert, although confusion and restlessness were minimally improving day by day and even began to answer questions.  Notably, early in his hospital stay, he was found to have several electrolyte imbalances, have acute liver failure, acute renal failure. Family refused hemodialysis even in the interim.   EEG on 07/20/17 showed signs of encephalopathy.  On 8/19, Brandon [name] was found to be more lethargic and would not follow commands. He became agitated and required Precedex gtt again. By 8/22 he was not talking, but again following commands.   According to the most current note, Brandon [name] is still confused. Neurology was consulted for evaluation and recommendation.  Per his wife, he is today the best that he has been in days as he was able to sleep for 30 minutes straight  twice.  Past Medical History:  Diagnosis Date  . Anemia 07/13/2017  . Anxiety   . Aortic insufficiency   . Aortic stenosis, moderate 07/13/2017  . Arthritis    back   . Carotid stenosis    Right carotid stent (widely patent) 40 - 59% left plaque 11/13  . Depression   . Dyslipidemia   . GERD (gastroesophageal reflux disease)   . Heart murmur   . Hemiplegia affecting unspecified side, late effect of cerebrovascular disease    resolved- from L side   . Hypertension   . Jaundice    resolved following ERCP & Cholecystectomy  . Mitral valve insufficiency and aortic valve insufficiency   . Pre-diabetes    per spouse  . Sleep apnea    does not wear CPAP  . Sleep concern    resulted in surgery- after + sleep test. Pt. doesn't have a problem any  longer.   . Stroke ([place]) 03/11/2003   stent placed on the 31, 3, 2004, L side   . Wears glasses   . Wears hearing aid in both ears   . Wears partial dentures     Past Surgical History:  Procedure Laterality Date  . BACK SURGERY     lumbar back  . CHOLECYSTECTOMY    . ERCP N/A 05/31/2013   Procedure: ENDOSCOPIC RETROGRADE CHOLANGIOPANCREATOGRAPHY (ERCP);  Surgeon: Ladene Artist, MD;  Location: [facility];  Service: Endoscopy;  Laterality: N/A;  . FOOT SURGERY     right  . LAPAROSCOPIC CHOLECYSTECTOMY SINGLE PORT N/A 06/01/2013   Procedure: LAPAROSCOPIC CHOLECYSTECTOMY SINGLE PORT;  Surgeon: Adin Hector, MD;  Location: [facility];  Service: General;  Laterality: N/A;  . POLYPECTOMY    . SHOULDER ARTHROSCOPY WITH ROTATOR CUFF REPAIR AND SUBACROMIAL DECOMPRESSION Left 05/18/2017  . SHOULDER ARTHROSCOPY WITH ROTATOR CUFF REPAIR AND SUBACROMIAL DECOMPRESSION Left 05/18/2017   Procedure: SHOULDER ARTHROSCOPY WITH ROTATOR CUFF REPAIR AND SUBACROMIAL DECOMPRESSION;  Surgeon: Tania Ade, MD;  Location: [facility];  Service: Orthopedics;  Laterality: Left;  LEFT SHOULDER ARTHROSCOPY WITH ROTATOR CUFF REPAIR AND SUBACROMIAL DECOMPRESSION  . TEE  WITHOUT CARDIOVERSION N/A 05/09/2017   Procedure: TRANSESOPHAGEAL ECHOCARDIOGRAM (TEE);  Surgeon: Skeet Latch, MD;  Location: [facility] ENDOSCOPY;  Service: Cardiovascular;  Laterality: N/A;  . TONSILLECTOMY      Family History  Problem Relation Age of Onset  . Stroke Father        No details  . Angina Mother     Social History:  reports that he has been smoking Cigarettes.  He has a 28.50 pack-year smoking history. He has never used smokeless tobacco. He reports that he drinks about 1.2 - 1.8 oz of alcohol per week . He reports that he does not use drugs.  Allergies  Allergen Reactions  . Bee Venom Anaphylaxis and Swelling    MEDICATIONS:                                                                                                                   Current Meds  Medication Sig  . aspirin EC 81 MG tablet Take 81 mg by mouth daily.    [name] aspirin-sod bicarb-citric acid (ALKA-SELTZER) 325 MG TBEF tablet Take 650 mg by mouth daily as needed (indigestion).  [name] buPROPion (WELLBUTRIN SR) 150 MG 12 hr tablet Take 150 mg by mouth 2 (two) times daily.   . calcium carbonate (TUMS - DOSED IN MG ELEMENTAL CALCIUM) 500 MG chewable tablet Chew 2 tablets by mouth daily as needed for indigestion or heartburn.  . citalopram (CELEXA) 20 MG tablet Take 20 mg by mouth daily before breakfast.   . EPIPEN 2-PAK 0.3 MG/0.3ML SOAJ injection Inject 0.3 mg into the muscle once as needed (allergic reaction).   [name] lisinopril (PRINIVIL,ZESTRIL) 20 MG tablet Take 20 mg by mouth at bedtime.   . lovastatin (MEVACOR) 20 MG tablet Take 1 tablet (20 mg total) by mouth at bedtime.  [name]  Multiple Vitamins-Minerals (ONE-A-DAY MENS 50+ ADVANTAGE) TABS Take 1 tablet by mouth daily with breakfast.  . omeprazole (PRILOSEC) 20 MG capsule Take 20 mg by mouth at bedtime.   [name] OVER THE COUNTER MEDICATION Hyland's Restful Legs tablets (AM/PM formulations): Take 1 tablet by mouth two times a day as needed for restless legs  .  Pseudoeph-Doxylamine-DM-APAP (NYQUIL PO) Take 1 Dose by mouth at bedtime as needed (cough).     Review Of Systems:                                                                                                           History obtained from unobtainable from patient due to language barrier  Blood pressure 93/63, pulse 69, temperature 98.3 F (36.8 C), temperature source Axillary, resp. rate 17, height 6\' 2"  (1.88 m), weight 103.1 kg (227 lb 4.7 oz), SpO2 98 %.   Physical Examination:                                                                                                      General: WDWN male. Appears calm and comfortable in bed. HEENT:  Normocephalic, no lesions, without obvious abnormality.  Normal external eye and conjunctiva.  Normal external ears. Normal external nose, mucus membranes and septum.  Normal pharynx. Cardiovascular: regular rate and rhythm, pulses palpable throughout   Pulmonary: Rales, unlabored breathing Abdomen: Soft, non-tender Extremities: no joint deformities, effusion, or inflammation Skin: warm and dry, no hyperpigmentation, vitiligo, or suspicious lesions  Neurological Examination:                                                                                               Mental Status: Brandon [name] is alert, oriented x 4, names most objects.  Speech severely dysarthric with some aphasia. Able to follow simple commands. Cranial Nerves: II: Visual fields grossly normal, pupils are equal, round, sluggishly reactive to light. III,IV, VI: Ptosis not present, extra-ocular muscle movements intact bilaterally V,VII: Slight right sided facial droop; Eyebrow raise is symmetric. Facial light touch sensation intact bilaterally VIII: Very hard of hearing IX,X: Uvula and palate rise symmetrically XI: Moves head in all directions easily XII: Midline tongue extension Motor: Moves all limbs anti-gravity with no obvious focal weakness. Sensory: Sunday [place]  touch  intact throughout, bilaterally Deep Tendon Reflexes: 2+ and symmetric throughout Plantars: Right: downgoing   Left: downgoing Cerebellar: Limbs move without ataxia. Gait: Normal gait and station   Lab Results: Basic Metabolic Panel:  Recent Labs Lab 07/29/17 0328 07/30/17 0317 07/31/17 0636 08/01/17 0432 08/02/17 0420 08/03/17 0618  NA 151* 150* 150* 150* 148* 143  K 3.7 3.9 3.8 4.1 3.7 3.2*  CL 117* 119* 117* 118* 115* 110  CO2 25 27 28 26 26 27   GLUCOSE 145* 123* 133* 144* 108* 130*  BUN 42* 40* 43* 48* 45* 44*  CREATININE 1.19 1.00 1.16 1.19 1.21 1.22  CALCIUM 8.4* 8.3* 8.4* 8.3* 8.5* 8.4*  MG 2.3 2.2 2.2 2.4 2.2 2.1  PHOS 3.1 3.4 3.9 4.5  --   --     Liver Function Tests:  Recent Labs Lab 07/30/17 0317 07/31/17 0636 08/01/17 0432 08/02/17 0420 08/03/17 0618  AST  --   --   --  32 42*  ALT  --   --   --  47 50  ALKPHOS  --   --   --  46 49  BILITOT  --   --   --  1.4* 1.2  PROT  --   --   --  5.7* 5.6*  ALBUMIN 2.1* 2.2* 2.0* 2.3* 2.2*   No results for input(s): LIPASE, AMYLASE in the last 168 hours.  Recent Labs Lab 07/30/17 1320  AMMONIA 21    CBC:  Recent Labs Lab 07/31/17 0636 08/01/17 0432 08/02/17 0420 08/03/17 0618 08/04/17 0500  WBC 13.5* 11.5* 10.2 12.3* 10.6*  NEUTROABS 11.8* 10.1* 8.4* 10.2* 8.5*  HGB 8.8* 7.8* 8.1* 7.4* 8.3*  HCT 28.6* 26.4* 26.9* 24.6* 27.4*  MCV 97.3 98.5 97.8 97.6 100.0  PLT 209 213 236 283 289    Cardiac Enzymes: No results for input(s): CKTOTAL, CKMB, CKMBINDEX, TROPONINI in the last 168 hours.  Lipid Panel: No results for input(s): CHOL, TRIG, HDL, CHOLHDL, VLDL, LDLCALC in the last 168 hours.  CBG:  Recent Labs Lab 08/03/17 1936 08/04/17 0101 08/04/17 0520 08/04/17 0740 08/04/17 1138  GLUCAP 106* 130* 115* 114* 59    Microbiology: Results for orders placed or performed during the hospital encounter of 07/13/17  MRSA PCR Screening     Status: None   Collection Time: 07/13/17  8:42 PM   Result Value Ref Range Status   MRSA by PCR NEGATIVE NEGATIVE Final    Comment:        The GeneXpert MRSA Assay (FDA approved for NASAL specimens only), is one component of a comprehensive MRSA colonization surveillance program. It is not intended to diagnose MRSA infection nor to guide or monitor treatment for MRSA infections.   Culture, blood (Routine X 2) w Reflex to ID Panel     Status: None   Collection Time: 07/14/17 10:20 PM  Result Value Ref Range Status   Specimen Description BLOOD RIGHT IJ  Final   Special Requests   Final    BOTTLES DRAWN AEROBIC AND ANAEROBIC Blood Culture adequate volume   Culture NO GROWTH 5 DAYS  Final   Report Status 07/19/2017 FINAL  Final  Culture, blood (Routine X 2) w Reflex to ID Panel     Status: None   Collection Time: 07/14/17 11:32 PM  Result Value Ref Range Status   Specimen Description BLOOD BLOOD LEFT HAND  Final   Special Requests   Final    BOTTLES DRAWN AEROBIC AND ANAEROBIC Blood Culture adequate  volume   Culture NO GROWTH 5 DAYS  Final   Report Status 07/20/2017 FINAL  Final  Culture, respiratory (NON-Expectorated)     Status: None   Collection Time: 07/19/17 10:13 PM  Result Value Ref Range Status   Specimen Description TRACHEAL ASPIRATE  Final   Special Requests NONE  Final   Gram Stain   Final    RARE WBC PRESENT, PREDOMINANTLY PMN FEW GRAM POSITIVE COCCI IN PAIRS IN CLUSTERS    Culture   Final    FEW STAPHYLOCOCCUS AUREUS FEW ENTEROBACTER SPECIES    Report Status 07/23/2017 FINAL  Final   Organism ID, Bacteria STAPHYLOCOCCUS AUREUS  Final   Organism ID, Bacteria ENTEROBACTER SPECIES  Final      Susceptibility   Enterobacter species - MIC*    CEFAZOLIN RESISTANT Resistant     CEFEPIME <=1 SENSITIVE Sensitive     CEFTAZIDIME <=1 SENSITIVE Sensitive     CEFTRIAXONE <=1 SENSITIVE Sensitive     CIPROFLOXACIN <=0.25 SENSITIVE Sensitive     GENTAMICIN <=1 SENSITIVE Sensitive     IMIPENEM <=0.25 SENSITIVE Sensitive      TRIMETH/SULFA <=20 SENSITIVE Sensitive     PIP/TAZO <=4 SENSITIVE Sensitive     * FEW ENTEROBACTER SPECIES   Staphylococcus aureus - MIC*    CIPROFLOXACIN <=0.5 SENSITIVE Sensitive     ERYTHROMYCIN <=0.25 SENSITIVE Sensitive     GENTAMICIN <=0.5 SENSITIVE Sensitive     OXACILLIN <=0.25 SENSITIVE Sensitive     TETRACYCLINE <=1 SENSITIVE Sensitive     VANCOMYCIN 1 SENSITIVE Sensitive     TRIMETH/SULFA <=10 SENSITIVE Sensitive     CLINDAMYCIN <=0.25 SENSITIVE Sensitive     RIFAMPIN <=0.5 SENSITIVE Sensitive     Inducible Clindamycin NEGATIVE Sensitive     * FEW STAPHYLOCOCCUS AUREUS    Coagulation Studies: No results for input(s): LABPROT, INR in the last 72 hours.  Imaging: No results found.   Assessment  Brandon [name] is a 71 year old man with a past medical history significant for stroke, hypertension, hyperlipidemia, afib with a complicated history course who is persistently confused following respiratory distressed at the beginning of the month. Per the chart and his wife, Brandon [name] is more confused and agitated overnight, which resolves some during the day.   It is likely that his presentation is multifactorial - delirium vs toxic encephalopathy as well as stroke vs anoxic brain injury. His presentation of delirium is not uncommon in elderly patients because of the environment and polypharmacy.  With a right facial droop, ?right sided neglect and aphasia, it is likely that he also had a left MCA territory infarct earlier in his hospital course. Labs already drawn (ie ammonia, B12) can not account for current state. Will check TSH and get MRI to evaluate. If positive for stroke, will proceed with stroke workup.  IMPRESSION -Evaluate for stroke -Toxic metabolic encephalopathy  Plan: 1) MRI  2) Reduce the use of sedating medications as much as possible 3) Frequent orientation 4) Correction of metabolic derangements 5) We will continue to follow.  Solon Augusta  PA-C [facility] [phone] 08/04/2017, 1:07 PM   ATTENDING ADDENDUM I have seen and examined the patient independently. I have discussed the details including history, physical exam, lab findings, imaging findings, assessment and plan with Solon Augusta, PA-C. I agree with H&P above. I have helped formulate the plan above. In addition to documented above, consider sertraline if needed at night instead of benzos.  We will continue to follow. -- Weldon Picking  Rory Percy, MD [facility] [phone]  If 7pm to 7am, please call on call as listed on AMION.

## 2017-08-04 NOTE — Progress Notes (Signed)
Pt c/o sudden sever headache 7/10, neurology notified,  Request called to MRI to make scan a stat, prn fentanyl given, wife at bedside,   oncoming nurse made aware.  Edward Qualia RN

## 2017-08-04 NOTE — Progress Notes (Signed)
Patient ID: Brandon [name], male   DOB: 1946/08/14, 71 y.o.   MRN: [id number]     Advanced Heart Failure Rounding Note   Subjective:    8/3 developed progressive respiratory distress => Bipap => intubation.  With sedation, became hypotensive and bradycardic, dopamine + phenylephrine started.  Developed AKI. Hyperkalemia treated with HCO3 gtt and Kayexalate.  Also with metabolic acidosis with elevated lactate (HCO3 gtt begun).  LFTs rose to the 1000s range.  Extubated 07/25/17.  Cor-Track placed 07/26/17, getting tube feeds.   CT head (8/23) with old stroke on right, nothing acute.   CVP 5, remains confused according to wife. He has not slept more than 5 min at a time per his wife. Does not answer questions when asked.    Antimicrobials this admission: 8/9 Cefepime >>8/12 8/10 Vancomycin >>8/12 8/12 Cipro >> 8/18  Microbiology results: 8/2 MRSA: negative 8/3 BCx: NGTD 8/8 TA: few S. Aureus, few Enterobacter - both S to cipro  Studies:  ECHO (5/18): EF 55-60%, moderate AS, moderate AI, moderate MR.   ECHO (8/18): EF 55-60%, moderate LVH, moderate AS mean 33, moderate AI, moderate-severe MR  Objective:   Weight Range: 227 lb 4.7 oz (103.1 kg) Body mass index is 29.18 kg/m.   Vital Signs:   Temp:  [97.3 F (36.3 C)-98.5 F (36.9 C)] 98.3 F (36.8 C) (08/23 2301) Pulse Rate:  [62-79] 70 (08/24 0742) Resp:  [14-27] 19 (08/24 0742) BP: (102-139)/(52-104) 107/59 (08/24 0742) SpO2:  [93 %-100 %] 99 % (08/24 0742) Weight:  [227 lb 4.7 oz (103.1 kg)] 227 lb 4.7 oz (103.1 kg) (08/24 0500) Last BM Date: 07/31/17  Weight change: Filed Weights   08/02/17 0500 08/03/17 0500 08/04/17 0500  Weight: 233 lb 4 oz (105.8 kg) 222 lb 3.6 oz (100.8 kg) 227 lb 4.7 oz (103.1 kg)   Intake/Output:   Intake/Output Summary (Last 24 hours) at 08/04/17 0810 Last data filed at 08/04/17 0643  Gross per 24 hour  Intake          2379.58 ml  Output             1345 ml  Net          1034.58  ml    Physical Exam   CVP 5 General:Chronically ill appearing male. NAD.  HEENT: Cortrak in place.   Neck: Supple, JVP 7-8 cm. Carotids 2+ bilat; no bruits. No thyromegaly or nodule noted. Cor: PMI nondisplaced.RRR, 2/6 SEM RUSB  Lungs: Clear bilaterally. Normal effort.   Abdomen: Soft, non tender, non distended. NoHSM. No bruits or masses. +BS  Extremities: No cyanosis, clubbing, or rash. No peripheral edema.  Neuro: Awake/alert, following commands.   Telemetry   NSR 70-80 - personally reviewed.   Labs    CBC  Recent Labs  08/03/17 0618 08/04/17 0500  WBC 12.3* 10.6*  NEUTROABS 10.2* 8.5*  HGB 7.4* 8.3*  HCT 24.6* 27.4*  MCV 97.6 100.0  PLT 283 093   Basic Metabolic Panel  Recent Labs  08/02/17 0420 08/03/17 0618  NA 148* 143  K 3.7 3.2*  CL 115* 110  CO2 26 27  GLUCOSE 108* 130*  BUN 45* 44*  CREATININE 1.21 1.22  CALCIUM 8.5* 8.4*  MG 2.2 2.1   Liver Function Tests  Recent Labs  08/02/17 0420 08/03/17 0618  AST 32 42*  ALT 47 50  ALKPHOS 46 49  BILITOT 1.4* 1.2  PROT 5.7* 5.6*  ALBUMIN 2.3* 2.2*   No results for input(s):  LIPASE, AMYLASE in the last 72 hours. Cardiac Enzymes No results for input(s): CKTOTAL, CKMB, CKMBINDEX, TROPONINI in the last 72 hours.  BNP: BNP (last 3 results)  Recent Labs  07/13/17 1341  BNP 408.4*    ProBNP (last 3 results) No results for input(s): PROBNP in the last 8760 hours.   D-Dimer No results for input(s): DDIMER in the last 72 hours. Hemoglobin A1C No results for input(s): HGBA1C in the last 72 hours. Fasting Lipid Panel No results for input(s): CHOL, HDL, LDLCALC, TRIG, CHOLHDL, LDLDIRECT in the last 72 hours. Thyroid Function Tests No results for input(s): TSH, T4TOTAL, T3FREE, THYROIDAB in the last 72 hours.  Invalid input(s): FREET3  Other results:   Imaging   Transthoracic Echocardiography 07/14/17 Study Conclusions  - Left ventricle: The cavity size was moderately dilated.  There was   moderate concentric hypertrophy. Systolic function was normal.   The estimated ejection fraction was in the range of 55% to 60%.   There is akinesis of the basalinferior myocardium. Features are   consistent with a pseudonormal left ventricular filling pattern,   with concomitant abnormal relaxation and increased filling   pressure (grade 2 diastolic dysfunction). Doppler parameters are   consistent with high ventricular filling pressure. - Aortic valve: Cusp separation was reduced. The peak aortic   velocity is elevated at 4.30m/sec but this is most likely high   due to increased flow across the AV from moderate AR. The mean   gradient of 63mmHg and VTI ratio of the LVOT/AV is most   consistent with moderate stenosis. There was moderate   regurgitation. Mean gradient (S): 33 mm Hg. VTI ratio of LVOT to   aortic valve: 0.27. Valve area (VTI): 0.85 cm^2. Valve area   (Vmax): 0.9 cm^2. Valve area (Vmean): 1.11 cm^2. Regurgitation   pressure half-time: 379 ms. - Mitral valve: Calcified annulus. Visually there appears to be   moderate to severe regurgitation. The PISA calculation does   appear accurate (mild MR by PISA). Valve area by pressure   half-time: 2.22 cm^2. Valve area by continuity equation (using   LVOT flow): 1.91 cm^2. - Left atrium: The atrium was moderately dilated. - Pulmonic valve: There was trivial regurgitation. - Pulmonary arteries: Systolic pressure could not be accurately   estimated.  Impressions:  - Compared to echo 04/2017, the mean AV gradient and peak AV   velocity are essentially unchanged.  Recommendations:  Consider TEE to evaluate MR further. Visually appears to be at least moderate to severe MR.    Medications:     Scheduled Medications: . amiodarone  200 mg Per Tube BID  . apixaban  5 mg Per Tube BID  . chlorhexidine  15 mL Mouth Rinse BID  . Chlorhexidine Gluconate Cloth  6 each Topical Daily  . feeding supplement (PRO-STAT  SUGAR FREE 64)  30 mL Per Tube TID  . free water  300 mL Per Tube Q6H  . furosemide  40 mg Per Tube Daily  . insulin aspart  0-20 Units Subcutaneous Q4H  . mouth rinse  15 mL Mouth Rinse q12n4p  . pantoprazole sodium  40 mg Per Tube Daily  . potassium chloride  40 mEq Oral Daily    Infusions: . feeding supplement (JEVITY 1.2 CAL) 1,000 mL (08/03/17 1330)    PRN Medications: fentaNYL (SUBLIMAZE) injection, glycopyrrolate, metoprolol tartrate, midazolam, nitroGLYCERIN, ondansetron (ZOFRAN) IV, sodium chloride flush    Patient Profile   71 yo with valvular heart disease and smoking/suspected COPD, CVA  with carotid stenting was admitted with dyspnea/respiratory distress.   Assessment/Plan   1. Acute hypoxemic respiratory failure: Probable mix of pulmonary edema, PNA and COPD flare.  Extubated 07/25/17.  CVP 5. PCT not elevated, afebrile.  He had a course of prednisone and has completed abx.  He diuresed well yesterday, lungs sound better, and CVP down.  - Continue lasix 40 mg daily.  - Abx stopped 07/29/17. (10 day course  for suspected PNA => trach aspirate with Enterobacter and Staph.  ) 2. Acute on chronic diastolic CHF: Baseline has at least moderate valvular disease (moderate AS, moderate AI, moderate-severe MR).  Suspect diastolic CHF triggered by poorly tolerated paroxysmal atrial fibrillation in setting of valvular disease.  Admission thought to be triggered by diastolic CHF/pulmonary edema.   He developed AKI with lactic acidosis in the setting of hypotension with sedation. CVP 8 today after good diuresis again yesterday. - continue po lasix as above.  - Per Dr. Aundra Dubin he will need eventual RHC/LHC but will hold off for now given altered mental status and severe debilitation.  Will try to get to CIR, then arrange for TEE + RHC/LHC when he is more recovered.   3. Shock: Probably mixed cardiogenic/vasodilatory.  Developed in the setting of sedation with Bipap then intubation as well as  bradycardia with sedation.  Resolved, has been off pressors.  - No change.  4. AKI: With hyperkalemia (treated) in setting of shock. Suspect ATN.   - BMET pending  5. Elevated LFTs: Improved, likely shock liver.  - No change.  6. Atrial fibrillation: Paroxysmal. Remains in NSR with frequent PACs on po amio now. - Continue Eliquis,  7. Anemia: Hgb improved today at 8.3 No active bleeding. FOBT negative 8/7. Transferrin saturation was 3% on 8/5. 1u RBCs on 8/17, had feraheme 8/21.  Transfuse hgb < 7.  8. ID: Blx CX NGTD. Suspected PNA.  On vancomycin/cefepime initially, then ciprofloxacin.  Stopped 07/30/17 after 10 day course. Wbc down.  PCT was not elevated 8/21. - no change.  9. COPD: Suspected.  Active smoker.  Was on prednisone to cover exacerbation.  - Steroids now complete 10. Valvular heart disease: Echo this admission with moderate AS, moderate AI, moderate to severe MR.  May eventually need consideration of surgical valve repair/replacement with Maze given poorly tolerated afib.  - Eventually should have TEE, but would like to see mental status improved. It is possible that this presentation is driven by valvular disease + atrial fibrillation triggering CHF.  He will need a lot of rehab before he is strong enough for heart surgery. As above, likely would get to CIR then when he has recovered arrange for TEE + LHC/RHC.  - No change.  11. Hypernatremia: Na improved at 143. Continue free water.   12. Delirium: Ongoing. Follows commands, moves all extremities but disoriented.  Head CT yesterday with old right-sided CVA (remote), no acute changes.    - Wife present to re-orient.  - He has not slept much, ok to stop tube feed for a nap so he can lie flat (per wife he can only sleep like this).  63.  H/o CVA: Has history of right MCA territory CVA.  - No change.  14. Now DNR/limited code - Successfully extubated 07/25/17.   - Family would not want CPR or intubation. Pressors ok  - No change.    15. Dysphagia: Seen by speech yesterday, remains NPO.  Continue tube feeds.  16. Deconditioning: Marked.  Needs much PT.   -  Seen by inpatient rehab yesterday, plan to follow medical readiness.   Length of Stay: [place], NP  08/04/2017, 8:10 AM  Advanced Heart Failure Team Pager [phone] (M-F; [facility])  Please contact [facility] for night-coverage after hours (4p -7a ) and weekends on [email or web address]  Patient seen with NP, agree with the above note.  He is hemodynamically stable.  He remains in NSR on amiodarone and Eliquis.  Creatinine stable, on po Lasix with CVP 5.    Very deconditioned.  Needs CIR but has had prolonged delirium.  Given prolonged delirium that does not appear to be clearing, I will ask neurology to see him today.   Loralie Champagne 08/04/2017 10:29 AM

## 2017-08-04 NOTE — Progress Notes (Signed)
  Speech Language Pathology Treatment: Dysphagia  Patient Details Name: Brandon [name] MRN: [id number] DOB: 09/11/1946 Today's Date: 08/04/2017 Time: 4270-6237 SLP Time Calculation (min) (ACUTE ONLY): 50 min  Assessment / Plan / Recommendation Clinical Impression  Lengthy session spent setting up oral suctioning and providing oral care, removing thick/hardened secretions along surface of hard/soft palate and tongue. Completed education with pt's wife re: continuing oral care over the weekend to supplement what is done by staff.  Pt consumed ice chips, which continue to evoke immediate cough response. Phonation is improved - per wife - but is still low volume/dysphonic; she rates it 4/10 re: baseline. Maintained respiratory trainer at same setting/  Pt completed two sets of five with clinician and moderate cues for deep inhalation.    Please continue to provide oral care over weekend; pt may have occasional ice chips after oral care.  Will f/u Monday to assess for readiness for repeat instrumental swallow study.  Discussed at length with Brandon [name].     HPI HPI: 71 yo with valvular heart disease and smoking/suspected COPD, CVA with carotid stenting was admitted with dyspnea/respiratory distress 8/2. Found to be in atrial fibrillation with rapid ventricular response and volume overload. Placed on Bipap but required intubated 8/3-8/14.       SLP Plan  Continue with current plan of care;Other (Comment)       Recommendations  Diet recommendations: NPO Medication Administration: Via alternative means                Oral Care Recommendations: Oral care QID Follow up Recommendations: Inpatient Rehab SLP Visit Diagnosis: Dysphagia, pharyngeal phase (R13.13) Plan: Continue with current plan of care;Other (Comment)       GO                Brandon [name] 08/04/2017, 4:23 PM

## 2017-08-04 NOTE — Progress Notes (Signed)
Feeding held per MD request at 10am to allow pt to sleep, hand mitts and posey belt removed at same time, no problems with agitation or attempts to get out of bed, will continue to leave restraints off, tele sitter active, wife at beside, call light in reach.  Edward Qualia RN

## 2017-08-04 NOTE — Progress Notes (Signed)
Physical Therapy Treatment Patient Details Name: Brandon [name] MRN: [id number] DOB: 1946-06-02 Today's Date: 08/04/2017    History of Present Illness 71 y.o. heavy smoker admitted with acute hypoxic respiratory failure along with atrial fibrillation with rapid ventricular response. Patient was found to be in volume overload. With confusion occurring after admission and hypotension on Precedex in the setting of acute on chronic renal failure patient was intubated on 8/3. Intubated 8/3-8/14.      PT Comments    Great improvement noted in pt's ability to participate in therapy.  Upon standing, pt noted to have had a small BM requiring cleaning and changing of bed pad. RN came in during session reporting pt has an order to take a nap. Therefore, PT unable to assist with transfer to recliner at this time and pt returned to supine in bed. RN advised to use the stedy vs maximove this PM for bed to chair. Pt's wife present during session and very pleased with pt's progress. CIR continues to be the appropriate d/c recommendation.    Follow Up Recommendations  CIR;Supervision/Assistance - 24 hour     Equipment Recommendations  Rolling walker with 5" wheels;3in1 (PT);Wheelchair (measurements PT);Wheelchair cushion (measurements PT)    Recommendations for Other Services       Precautions / Restrictions Precautions Precautions: Shoulder;Fall Precaution Comments: Per Dr Tamera Punt (pt s/p Lt RCR early June) NWB Lt UE except to assist with standing and able to use RW; No pulling or pushing with Lt UE;  unrestricted PROM/AROM is okay.  No strengthening      Mobility  Bed Mobility     Rolling: Max assist   Supine to sit: Max assist;+2 for physical assistance;HOB elevated     General bed mobility comments: use of bed pad to scoot to EOB  Transfers   Equipment used: Rolling walker (2 wheeled)   Sit to Stand: +2 physical assistance;Mod assist         General transfer comment: Sit to  stand x 2 reps. Pt able to stand statically with RW 1-2 minutes per trial with +2 min assist.  Ambulation/Gait             General Gait Details: unable   Stairs            Wheelchair Mobility    Modified Rankin (Stroke Patients Only)       Balance   Sitting-balance support: Feet supported;Bilateral upper extremity supported Sitting balance-Leahy Scale: Fair Sitting balance - Comments: Min guard assist to maintain balance EOB.    Standing balance support: Bilateral upper extremity supported;During functional activity Standing balance-Leahy Scale: Poor Standing balance comment: heavy reliance on RW and physical assist to maintain static stand with RW.                            Cognition Arousal/Alertness: Awake/alert Behavior During Therapy: Flat affect Overall Cognitive Status: Impaired/Different from baseline                   Orientation Level: Disoriented to;Time;Situation Current Attention Level: Selective Memory: Decreased recall of precautions;Decreased short-term memory Following Commands: Follows one step commands consistently Safety/Judgement: Decreased awareness of safety;Decreased awareness of deficits Awareness: Intellectual Problem Solving: Slow processing;Decreased initiation;Difficulty sequencing        Exercises      General Comments        Pertinent Vitals/Pain Pain Assessment: Faces Faces Pain Scale: No hurt    Home Living  Prior Function            PT Goals (current goals can now be found in the care plan section) Acute Rehab PT Goals Patient Stated Goal: not stated PT Goal Formulation: With patient Time For Goal Achievement: 08/10/17 Potential to Achieve Goals: Good Progress towards PT goals: Progressing toward goals    Frequency    Min 3X/week      PT Plan Current plan remains appropriate    Co-evaluation              AM-PAC PT "6 Clicks" Daily Activity   Outcome Measure  Difficulty turning over in bed (including adjusting bedclothes, sheets and blankets)?: Unable Difficulty moving from lying on back to sitting on the side of the bed? : Unable Difficulty sitting down on and standing up from a chair with arms (e.g., wheelchair, bedside commode, etc,.)?: Unable Help needed moving to and from a bed to chair (including a wheelchair)?: A Lot Help needed walking in hospital room?: Total Help needed climbing 3-5 steps with a railing? : Total 6 Click Score: 7    End of Session Equipment Utilized During Treatment: Gait belt Activity Tolerance: Patient tolerated treatment well Patient left: in bed;with call bell/phone within reach;with family/visitor present;with bed alarm set;Other (comment) (posey belt) Nurse Communication: Mobility status;Need for lift equipment PT Visit Diagnosis: Unsteadiness on feet (R26.81);Muscle weakness (generalized) (M62.81)     Time: 4492-0100 PT Time Calculation (min) (ACUTE ONLY): 28 min  Charges:  $Therapeutic Activity: 23-37 mins                    G Codes:       Lorrin Goodell, PT  Office # [phone] Pager [phone]    Lorriane Shire 08/04/2017, 11:07 AM

## 2017-08-04 NOTE — Progress Notes (Signed)
Inpatient Rehabilitation  Note Neuro consult and questionable plans for a TEE.  Continuing to follow for timing of anticipated medical readiness prior to initiation of insurance authorization.  Please call with questions.   Carmelia Roller., CCC/SLP Admission Coordinator  [facility]  Cell [phone]

## 2017-08-04 NOTE — Progress Notes (Signed)
Pt able to transfer from bed to chair with max X2 assistance and walker able to use right LE to assist with transfer, left LE weak, able to sit in chair for 1.5 hours, assisted back to bed X2 max assist without difficulty, mentation improved throughout shift, able to answer simple questions and follow directions.  Edward Qualia RN

## 2017-08-04 NOTE — Care Management Important Message (Signed)
Important Message  Patient Details  Name: Brandon [name] MRN: [id number] Date of Birth: 1946-01-20   Medicare Important Message Given:  Yes    Nathen May 08/04/2017, 10:53 AM

## 2017-08-05 ENCOUNTER — Inpatient Hospital Stay (HOSPITAL_COMMUNITY): Payer: Medicare HMO

## 2017-08-05 DIAGNOSIS — E870 Hyperosmolality and hypernatremia: Secondary | ICD-10-CM

## 2017-08-05 DIAGNOSIS — M109 Gout, unspecified: Secondary | ICD-10-CM

## 2017-08-05 LAB — GLUCOSE, CAPILLARY
GLUCOSE-CAPILLARY: 105 mg/dL — AB (ref 65–99)
GLUCOSE-CAPILLARY: 141 mg/dL — AB (ref 65–99)
GLUCOSE-CAPILLARY: 166 mg/dL — AB (ref 65–99)
Glucose-Capillary: 106 mg/dL — ABNORMAL HIGH (ref 65–99)
Glucose-Capillary: 115 mg/dL — ABNORMAL HIGH (ref 65–99)
Glucose-Capillary: 120 mg/dL — ABNORMAL HIGH (ref 65–99)
Glucose-Capillary: 128 mg/dL — ABNORMAL HIGH (ref 65–99)
Glucose-Capillary: 188 mg/dL — ABNORMAL HIGH (ref 65–99)

## 2017-08-05 LAB — COMPREHENSIVE METABOLIC PANEL
ALT: 54 U/L (ref 17–63)
AST: 52 U/L — AB (ref 15–41)
Albumin: 2.3 g/dL — ABNORMAL LOW (ref 3.5–5.0)
Alkaline Phosphatase: 47 U/L (ref 38–126)
Anion gap: 7 (ref 5–15)
BILIRUBIN TOTAL: 0.8 mg/dL (ref 0.3–1.2)
BUN: 40 mg/dL — AB (ref 6–20)
CHLORIDE: 110 mmol/L (ref 101–111)
CO2: 27 mmol/L (ref 22–32)
CREATININE: 1.15 mg/dL (ref 0.61–1.24)
Calcium: 8.6 mg/dL — ABNORMAL LOW (ref 8.9–10.3)
GFR calc Af Amer: >60 mL/min (ref >60)
Glucose, Bld: 127 mg/dL — ABNORMAL HIGH (ref 65–99)
Potassium: 4.5 mmol/L (ref 3.5–5.1)
Sodium: 144 mmol/L (ref 135–145)
TOTAL PROTEIN: 5.8 g/dL — AB (ref 6.5–8.1)

## 2017-08-05 LAB — CBC WITH DIFFERENTIAL/PLATELET
BASOS ABS: 0 10*3/uL (ref 0.0–0.1)
Basophils Relative: 0 %
EOS ABS: 0.2 10*3/uL (ref 0.0–0.7)
EOS PCT: 2 %
HCT: 34.7 % — ABNORMAL LOW (ref 39.0–52.0)
HEMOGLOBIN: 10.3 g/dL — AB (ref 13.0–17.0)
LYMPHS PCT: 13 %
Lymphs Abs: 1.2 10*3/uL (ref 0.7–4.0)
MCH: 29.6 pg (ref 26.0–34.0)
MCHC: 29.7 g/dL — ABNORMAL LOW (ref 30.0–36.0)
MCV: 99.7 fL (ref 78.0–100.0)
Monocytes Absolute: 0.5 10*3/uL (ref 0.1–1.0)
Monocytes Relative: 5 %
NEUTROS PCT: 80 %
Neutro Abs: 7.4 10*3/uL (ref 1.7–7.7)
PLATELETS: 260 10*3/uL (ref 150–400)
RBC: 3.48 MIL/uL — AB (ref 4.22–5.81)
RDW: 19.2 % — ABNORMAL HIGH (ref 11.5–15.5)
WBC: 9.2 10*3/uL (ref 4.0–10.5)

## 2017-08-05 LAB — MAGNESIUM: MAGNESIUM: 2.3 mg/dL (ref 1.7–2.4)

## 2017-08-05 MED ORDER — PREDNISONE 20 MG PO TABS
40.0000 mg | ORAL_TABLET | Freq: Every day | ORAL | Status: AC
  Administered 2017-08-05 – 2017-08-07 (×3): 40 mg via ORAL
  Filled 2017-08-05 (×3): qty 2

## 2017-08-05 NOTE — Progress Notes (Signed)
Bedside EEG completed, results pending. 

## 2017-08-05 NOTE — Progress Notes (Signed)
Patient ID: Brandon [name], male   DOB: Apr 18, 1946, 71 y.o.   MRN: [id number]     Advanced Heart Failure Rounding Note   Subjective:    8/3 developed progressive respiratory distress => Bipap => intubation.  With sedation, became hypotensive and bradycardic, dopamine + phenylephrine started.  Developed AKI. Hyperkalemia treated with HCO3 gtt and Kayexalate.  Also with metabolic acidosis with elevated lactate (HCO3 gtt begun).  LFTs rose to the 1000s range.  Extubated 07/25/17.  Cor-Track placed 07/26/17, getting tube feeds.   CT head (8/23) with old stroke on right, nothing acute.   More alert today. Son in room. C/o R wrist pain and very thirsty Failed swallow study so NPO    Antimicrobials this admission: 8/9 Cefepime >>8/12 8/10 Vancomycin >>8/12 8/12 Cipro >> 8/18  Microbiology results: 8/2 MRSA: negative 8/3 BCx: NGTD 8/8 TA: few S. Aureus, few Enterobacter - both S to cipro  Studies:  ECHO (5/18): EF 55-60%, moderate AS, moderate AI, moderate MR.   ECHO (8/18): EF 55-60%, moderate LVH, moderate AS mean 33, moderate AI, moderate-severe MR  Objective:   Weight Range: 104.3 kg (230 lb) Body mass index is 29.53 kg/m.   Vital Signs:   Temp:  [97.5 F (36.4 C)-98.7 F (37.1 C)] 98.2 F (36.8 C) (08/25 0801) Pulse Rate:  [66-81] 66 (08/25 1049) Resp:  [12-18] 17 (08/25 1049) BP: (101-134)/(59-72) 134/59 (08/25 1049) SpO2:  [97 %-100 %] 100 % (08/25 1049) Weight:  [104.3 kg (230 lb)] 104.3 kg (230 lb) (08/25 0349) Last BM Date: 08/03/17  Weight change: Filed Weights   08/03/17 0500 08/04/17 0500 08/05/17 0349  Weight: 100.8 kg (222 lb 3.6 oz) 103.1 kg (227 lb 4.7 oz) 104.3 kg (230 lb)   Intake/Output:   Intake/Output Summary (Last 24 hours) at 08/05/17 1419 Last data filed at 08/05/17 0800  Gross per 24 hour  Intake          2420.92 ml  Output              650 ml  Net          1770.92 ml    Physical Exam   CVP 5 General:Chronically ill appearing  male. NAD.  HEENT: Cortrak in place.  Poor dentition.  Neck: Supple, JVP 7-8 cm. Carotids 2+ bilat; no bruits. No thyromegaly or nodule noted. Cor: PMI nondisplaced.RRR, 2/6 SEM RUSB Lungs: Clear  Abdomen: soft NT/ND good BS Extremities: no cyanosis, clubbing, rash, edema/ right wrist sore. Limited ROM tender to otuch Neuro: alert & oriented x 3, follows commands cranial nerves grossly intact. moves all 4 extremities w/o difficulty. Affect pleasant   Telemetry   NSR 60-70s personally reviewed.   Labs    CBC  Recent Labs  08/04/17 0500 08/05/17 0500  WBC 10.6* 9.2  NEUTROABS 8.5* 7.4  HGB 8.3* 10.3*  HCT 27.4* 34.7*  MCV 100.0 99.7  PLT 289 409   Basic Metabolic Panel  Recent Labs  08/04/17 1416 08/05/17 0500  NA 144 144  K 4.4 4.5  CL 112* 110  CO2 26 27  GLUCOSE 123* 127*  BUN 38* 40*  CREATININE 1.12 1.15  CALCIUM 8.5* 8.6*  MG 2.1 2.3   Liver Function Tests  Recent Labs  08/04/17 1416 08/05/17 0500  AST 45* 52*  ALT 50 54  ALKPHOS 45 47  BILITOT 1.0 0.8  PROT 5.5* 5.8*  ALBUMIN 2.2* 2.3*   No results for input(s): LIPASE, AMYLASE in the last 72 hours. Cardiac  Enzymes No results for input(s): CKTOTAL, CKMB, CKMBINDEX, TROPONINI in the last 72 hours.  BNP: BNP (last 3 results)  Recent Labs  07/13/17 1341  BNP 408.4*    ProBNP (last 3 results) No results for input(s): PROBNP in the last 8760 hours.   D-Dimer No results for input(s): DDIMER in the last 72 hours. Hemoglobin A1C No results for input(s): HGBA1C in the last 72 hours. Fasting Lipid Panel No results for input(s): CHOL, HDL, LDLCALC, TRIG, CHOLHDL, LDLDIRECT in the last 72 hours. Thyroid Function Tests  Recent Labs  08/04/17 2311  TSH 3.239    Other results:   Imaging   Transthoracic Echocardiography 07/14/17 Study Conclusions  - Left ventricle: The cavity size was moderately dilated. There was   moderate concentric hypertrophy. Systolic function was normal.    The estimated ejection fraction was in the range of 55% to 60%.   There is akinesis of the basalinferior myocardium. Features are   consistent with a pseudonormal left ventricular filling pattern,   with concomitant abnormal relaxation and increased filling   pressure (grade 2 diastolic dysfunction). Doppler parameters are   consistent with high ventricular filling pressure. - Aortic valve: Cusp separation was reduced. The peak aortic   velocity is elevated at 4.72m/sec but this is most likely high   due to increased flow across the AV from moderate AR. The mean   gradient of 67mmHg and VTI ratio of the LVOT/AV is most   consistent with moderate stenosis. There was moderate   regurgitation. Mean gradient (S): 33 mm Hg. VTI ratio of LVOT to   aortic valve: 0.27. Valve area (VTI): 0.85 cm^2. Valve area   (Vmax): 0.9 cm^2. Valve area (Vmean): 1.11 cm^2. Regurgitation   pressure half-time: 379 ms. - Mitral valve: Calcified annulus. Visually there appears to be   moderate to severe regurgitation. The PISA calculation does   appear accurate (mild MR by PISA). Valve area by pressure   half-time: 2.22 cm^2. Valve area by continuity equation (using   LVOT flow): 1.91 cm^2. - Left atrium: The atrium was moderately dilated. - Pulmonic valve: There was trivial regurgitation. - Pulmonary arteries: Systolic pressure could not be accurately   estimated.  Impressions:  - Compared to echo 04/2017, the mean AV gradient and peak AV   velocity are essentially unchanged.  Recommendations:  Consider TEE to evaluate MR further. Visually appears to be at least moderate to severe MR.    Medications:     Scheduled Medications: . amiodarone  200 mg Per Tube BID  . apixaban  5 mg Per Tube BID  . chlorhexidine  15 mL Mouth Rinse BID  . Chlorhexidine Gluconate Cloth  6 each Topical Daily  . feeding supplement (PRO-STAT SUGAR FREE 64)  30 mL Per Tube TID  . free water  300 mL Per Tube Q6H  .  furosemide  40 mg Per Tube Daily  . insulin aspart  0-20 Units Subcutaneous Q4H  . LORazepam  2 mg Intravenous Once  . mouth rinse  15 mL Mouth Rinse q12n4p  . pantoprazole sodium  40 mg Per Tube Daily  . potassium chloride  40 mEq Oral Daily    Infusions: . feeding supplement (JEVITY 1.2 CAL) 1,000 mL (08/04/17 2230)    PRN Medications: fentaNYL (SUBLIMAZE) injection, glycopyrrolate, metoprolol tartrate, midazolam, nitroGLYCERIN, ondansetron (ZOFRAN) IV, sodium chloride flush    Patient Profile   71 yo with valvular heart disease and smoking/suspected COPD, CVA with carotid stenting was admitted with  dyspnea/respiratory distress.   Assessment/Plan   1. Acute hypoxemic respiratory failure: Probable mix of pulmonary edema, PNA and COPD flare.  Extubated 07/25/17.  CVP 5. PCT not elevated, afebrile.  He had a course of prednisone and has completed abx.  - Volume status stable  - Continue lasix 40 mg daily. Can give extra as needed - Abx stopped 07/29/17. (10 day course  for suspected PNA => trach aspirate with Enterobacter and Staph.  ) 2. Acute on chronic diastolic CHF: Baseline has at least moderate valvular disease (moderate AS, moderate AI, moderate-severe MR).  Suspect diastolic CHF triggered by poorly tolerated paroxysmal atrial fibrillation in setting of valvular disease.  Admission thought to be triggered by diastolic CHF/pulmonary edema.   He developed AKI with lactic acidosis in the setting of hypotension with sedation. CVP 8 today after good diuresis again yesterday. - continue po lasix as above.  - Per Dr. Aundra Dubin he will need eventual RHC/LHC but will hold off for now given altered mental status and severe debilitation.  Will try to get to CIR, then arrange for TEE to evaluate valves+ RHC/LHC when he is more recovered.   3. Shock: Probably mixed cardiogenic/vasodilatory.  Developed in the setting of sedation with Bipap then intubation as well as bradycardia with sedation.   Resolved, has been off pressors.  - No change.  4. AKI: With hyperkalemia (treated) in setting of shock. Suspect ATN.   - Resolves. Cr 1.15 today 5. Elevated LFTs: Improved, likely shock liver.  - Resolved 6. Atrial fibrillation: Paroxysmal. Remains in NSR with frequent PACs on po amio now. - Continue Eliquis,  7. Anemia: Hgb improved today at 8.3 No active bleeding. FOBT negative 8/7. Transferrin saturation was 3% on 8/5. 1u RBCs on 8/17, had feraheme 8/21.  - - hgb 10.3 today 8. ID: Blx CX NGTD. Suspected PNA.  On vancomycin/cefepime initially, then ciprofloxacin.  Stopped 07/30/17 after 10 day course. Wbc down.  PCT was not elevated 8/21. - no change.  9. COPD: Suspected.  Active smoker.  Was on prednisone to cover exacerbation.  - Steroids now complete 10. Valvular heart disease: Echo this admission with moderate AS, moderate AI, moderate to severe MR.  May eventually need consideration of surgical valve repair/replacement with Maze given poorly tolerated afib.  - Eventually should have TEE, but would like to see mental status improved. It is possible that this presentation is driven by valvular disease + atrial fibrillation triggering CHF.  He will need a lot of rehab before he is strong enough for heart surgery. As above, likely would get to CIR then when he has recovered arrange for TEE + LHC/RHC.  - No change.  11. Hypernatremia: Na improved at 144. Continue free water.   12. Delirium: Ongoing. Follows commands, moves all extremities but disoriented.  Head CT 8/23 with old right-sided CVA (remote), no acute changes.    - Improved today. Son present to re-orient.  50.  H/o CVA: Has history of right MCA territory CVA.  - No change.  14. Now DNR/limited code - Successfully extubated 07/25/17.   - Family would not want CPR or intubation. Pressors ok  - No change.  15. Dysphagia: Seen by speech again today, remains NPO.  Continue tube feeds.  16. Deconditioning: Marked.  Needs much PT.     - Seen by inpatient rehab yesterday, plan to follow medical readiness.  17. R wrist gout - start prednisone 40mg  x 3 days. Check uric acid  Length of Stay: 22  Glori Bickers, MD  08/05/2017, 2:19 PM  Advanced Heart Failure Team Pager [phone] (M-F; 7a - 4p)  Please contact [facility] for night-coverage after hours (4p -7a ) and weekends on [email or web address]

## 2017-08-05 NOTE — Progress Notes (Signed)
Date of recording 08/05/2017  Referring physician Solon Augusta  Reason for the study 71 year old male with history of right MCA stroke, altered mental status. Technical Digital EEG recording using 10-20 international electrode system  Description of the recording The left posterior dominant rhythm is 6-7 Hz, symmetrical EEG comprises of generalized delta and theta slowing with EEG reactivity  Sleep was not obtained.  Impression The EEG is abnormal and findings are consistent with mild generalized cerebral dysfunction of nonspecific etiology, epileptiform features were not seen during this recording

## 2017-08-05 NOTE — Progress Notes (Signed)
Subjective: Brandon [name] was semirecumbent in bed this morning. His wife was shaving him. Headache has resolved. Although he spoke incomprehensibly most of the time, he did have moments of clarity with clear speech and intention when I was there. He was alert, oriented to place and year. To president he said, "I don't want to talk about it". He followed simple commands readily. He was very grateful for the neuro team's input and thanked me for our services multiple times.  Pertinent Labs/Diagnostics: MRI 08/04/17: Large chronic right MCA territory infarct. Elsewhere largely unremarkable for age.  Physical Examination: Vitals:   08/05/17 0025 08/05/17 0349  BP: 109/63 116/72  Pulse: 73 78  Resp: 18 12  Temp: (!) 97.5 F (36.4 C) 98.7 F (37.1 C)  SpO2: 99% 100%    General: WDWN male.  HEENT:  Normocephalic, no lesions, without obvious abnormality.  Normal external eye and conjunctiva.  Normal external ears. Normal external nose, mucus membranes and septum.  Normal pharynx. Cardiovascular: regular rate and rhythm, pulses palpable throughout   Pulmonary: Chest clear, unlabored breathing Abdomen: Soft, non-tender Extremities: no joint deformities, effusion, or inflammation Musculoskeletal: Tone and bulk normal throughout; no atrophy noted  Neurological Examination:  CN: Pupils are equal, round and symmetrically reactive. EOMI without nystagmus. Facial sensation is intact to light touch. Very slight ?right sided facial droop with normal strength and mobility. Very hard of hearing. Palate elevates symmetrically and uvula is midline. Voice is horse, normal pitch, it seems to require Brandon [name] considerable effort to speak. Moves head in all directions. Tongue is midline with normal bulk and mobility.  Motor: Normal bulk, tone, and strength. 5/5 throughout. No drift.  Sensation: Intact to light touch.  DTRs: 2+, symmetric  Toes downgoing bilaterally. No pathologic reflexes.  Coordination:  Finger-to-nose and heel-to-shin are without dysmetria    Assessment:  Brandon [name] is a 71 year old man with a past medical history significant for stroke, hypertension, hyperlipidemia, afib with a complicated history course who is persistently confused following respiratory distressed at the beginning of the month. Per the chart and his wife, Brandon [name] is more confused and agitated overnight, which resolves some during the day.   MRI showed no new infarct. All labs of concern are within normal limits. It is likely that his presentation is due to toxic metabolic encephalopathy, which he is beginning to recover from. Even so, it is possible that he is seizing through the area of previous stroke (right MCA territory). Will get EEG to further evaluate.  I discussed delirium with his wife, how his presentation is not uncommon because of the hospital stay and polypharmacy, and reinforced the need for frequent orientation and maintenance of day/night cycles as much as possible.  Recommendations: 1) EEG 2) Reduce the use of sedating medications as much as possible 3) Frequent orientation 4) Correction of metabolic derangements  -- Solon Augusta PA-C [facility] [phone]  08/05/2017, 7:28 AM  ATTENDING ADDENDUM Pt. Seen and examined MRI shows no new infarct. Large area of old infarct. No new infarct. Possible prolonged toxic/metabolic encephalopathy due to critical illness as well as old infarct.  EEG shows no evidence of seizures. Likely prolonged metabolic/toxic encephalopathy. No further neurological work up advised at this time. Continue to minimize sedation and correct derangements as you are.   Please call us with questions. Plan was communicated with patient's son at bedside and wife by phone and all questions were answered.  -- Amie Portland, MD [facility] [phone]  If  7pm to 7am, please call on call as listed on AMION.

## 2017-08-06 LAB — COMPREHENSIVE METABOLIC PANEL
ALBUMIN: 2.4 g/dL — AB (ref 3.5–5.0)
ALK PHOS: 48 U/L (ref 38–126)
ALT: 54 U/L (ref 17–63)
ANION GAP: 6 (ref 5–15)
AST: 45 U/L — ABNORMAL HIGH (ref 15–41)
BUN: 43 mg/dL — ABNORMAL HIGH (ref 6–20)
CALCIUM: 8.6 mg/dL — AB (ref 8.9–10.3)
CHLORIDE: 110 mmol/L (ref 101–111)
CO2: 26 mmol/L (ref 22–32)
Creatinine, Ser: 1.1 mg/dL (ref 0.61–1.24)
GFR calc Af Amer: >60 mL/min (ref >60)
GFR calc non Af Amer: >60 mL/min (ref >60)
GLUCOSE: 145 mg/dL — AB (ref 65–99)
POTASSIUM: 4.3 mmol/L (ref 3.5–5.1)
SODIUM: 142 mmol/L (ref 135–145)
Total Bilirubin: 1 mg/dL (ref 0.3–1.2)
Total Protein: 5.9 g/dL — ABNORMAL LOW (ref 6.5–8.1)

## 2017-08-06 LAB — CBC WITH DIFFERENTIAL/PLATELET
BASOS PCT: 0 %
Basophils Absolute: 0 10*3/uL (ref 0.0–0.1)
EOS ABS: 0 10*3/uL (ref 0.0–0.7)
EOS PCT: 0 %
HCT: 25.3 % — ABNORMAL LOW (ref 39.0–52.0)
Hemoglobin: 7.7 g/dL — ABNORMAL LOW (ref 13.0–17.0)
LYMPHS ABS: 1 10*3/uL (ref 0.7–4.0)
Lymphocytes Relative: 7 %
MCH: 30.6 pg (ref 26.0–34.0)
MCHC: 30.4 g/dL (ref 30.0–36.0)
MCV: 100.4 fL — ABNORMAL HIGH (ref 78.0–100.0)
MONO ABS: 0.7 10*3/uL (ref 0.1–1.0)
MONOS PCT: 5 %
Neutro Abs: 11.8 10*3/uL — ABNORMAL HIGH (ref 1.7–7.7)
Neutrophils Relative %: 87 %
Platelets: 331 10*3/uL (ref 150–400)
RBC: 2.52 MIL/uL — ABNORMAL LOW (ref 4.22–5.81)
RDW: 19.5 % — AB (ref 11.5–15.5)
WBC: 13.6 10*3/uL — ABNORMAL HIGH (ref 4.0–10.5)

## 2017-08-06 LAB — GLUCOSE, CAPILLARY
GLUCOSE-CAPILLARY: 157 mg/dL — AB (ref 65–99)
Glucose-Capillary: 141 mg/dL — ABNORMAL HIGH (ref 65–99)
Glucose-Capillary: 121 mg/dL — ABNORMAL HIGH (ref 65–99)
Glucose-Capillary: 144 mg/dL — ABNORMAL HIGH (ref 65–99)

## 2017-08-06 LAB — MAGNESIUM: Magnesium: 2.2 mg/dL (ref 1.7–2.4)

## 2017-08-06 MED ORDER — FREE WATER
200.0000 mL | Freq: Four times a day (QID) | Status: DC
  Administered 2017-08-06 – 2017-08-07 (×4): 200 mL

## 2017-08-06 NOTE — Progress Notes (Signed)
Occupational Therapy Treatment Patient Details Name: Brandon [name] MRN: [id number] DOB: 08-03-1946 Today's Date: 08/06/2017    History of present illness 71 y.o. heavy smoker admitted with acute hypoxic respiratory failure along with atrial fibrillation with rapid ventricular response. Patient was found to be in volume overload. With confusion occurring after admission and hypotension on Precedex in the setting of acute on chronic renal failure patient was intubated on 8/3. Intubated 8/3-8/14.     OT comments  Pt. Was seen for skilled OT with focus on ADLs. Pt. Was able to follow 1 step direction with mod cues. Pt. Is HOH which may be impacting pt. abilitly to follow directions. Pt. Was cooperative during session and did follow 1 step directions 100% with extra time and cues. Pt. Was able to wash face and hands with cues to avoid pulling on feeding tube. Pt. Was able to wash UE with cues to initate and continue tasks. Pt. Was Mod a with doffing and donning of hosptial gown with cues for sequecing task and completing tasks.   Follow Up Recommendations  CIR    Equipment Recommendations       Recommendations for Other Services      Precautions / Restrictions Precautions Precautions: Shoulder;Fall Precaution Comments: Per Dr Tamera Punt (pt s/p Lt RCR early June) NWB Lt UE except to assist with standing and able to use RW; No pulling or pushing with Lt UE;  unrestricted PROM/AROM is okay.  No strengthening   Restrictions LUE Weight Bearing: Non weight bearing       Mobility Bed Mobility     Rolling: Mod assist         General bed mobility comments: Pt. was min a with rolling to the R and was Mod a with rolling to R secondary pt. is not able to use L UE.   Transfers                      Balance                                           ADL either performed or assessed with clinical judgement   ADL       Grooming: Wash/dry hands;Wash/dry  face;Applying deodorant;Minimal assistance   Upper Body Bathing: Moderate assistance;Cueing for sequencing;Bed level       Upper Body Dressing : Moderate assistance;Cueing for sequencing;Bed level                     General ADL Comments: Pt. was seen for ADL session. Pt. required mod cues to perform tasks. Pt. is HOH which may be impacting pt. ability to follow directions. Pt. was cooperative during session.      Vision       Perception     Praxis      Cognition Arousal/Alertness: Awake/alert Behavior During Therapy: WFL for tasks assessed/performed Overall Cognitive Status: Impaired/Different from baseline Area of Impairment: Orientation;Memory;Following commands                 Orientation Level: Situation Current Attention Level: Focused   Following Commands: Follows one step commands consistently Safety/Judgement: Decreased awareness of safety;Decreased awareness of deficits              Exercises     Shoulder Instructions       General Comments  Pertinent Vitals/ Pain       Pain Assessment: No/denies pain  Home Living                                          Prior Functioning/Environment              Frequency           Progress Toward Goals  OT Goals(current goals can now be found in the care plan section)  Progress towards OT goals: Progressing toward goals  Acute Rehab OT Goals Patient Stated Goal: He wants to go home. Pt. wife wants CIR for rehabl. OT Goal Formulation: With patient/family ADL Goals Pt Will Perform Upper Body Bathing: with mod assist Pt Will Perform Upper Body Dressing: with mod assist  Plan Discharge plan remains appropriate    Co-evaluation                 AM-PAC PT "6 Clicks" Daily Activity     Outcome Measure   Help from another person eating meals?: Total Help from another person taking care of personal grooming?: A Lot Help from another person toileting,  which includes using toliet, bedpan, or urinal?: Total Help from another person bathing (including washing, rinsing, drying)?: A Lot Help from another person to put on and taking off regular upper body clothing?: A Lot Help from another person to put on and taking off regular lower body clothing?: Total 6 Click Score: 9    End of Session    OT Visit Diagnosis: Unsteadiness on feet (R26.81);Muscle weakness (generalized) (M62.81)   Activity Tolerance Patient tolerated treatment well   Patient Left in bed;with call bell/phone within reach;with bed alarm set;with family/visitor present   Nurse Communication          Time: 7741-2878 OT Time Calculation (min): 53 min  Charges: OT General Charges $OT Visit: 1 Procedure OT Treatments $Self Care/Home Management : 38-52 mins $Therapeutic Activity: 6-76 mins  6 clicks score of 9  Mahasin Riviere 08/06/2017, 8:53 AM

## 2017-08-06 NOTE — Progress Notes (Signed)
Physical Therapy Treatment Patient Details Name: Brandon [name] MRN: [id number] DOB: [birth year] Today's Date: 08/06/2017    History of Present Illness 71 y.o. heavy smoker admitted with acute hypoxic respiratory failure along with atrial fibrillation with rapid ventricular response. Patient was found to be in volume overload. With confusion occurring after admission and hypotension on Precedex in the setting of acute on chronic renal failure patient was intubated on 8/3. Intubated 8/3-8/14.      PT Comments    Patient making slow improvement with mobility/transfers.  Agree with need for CIR stay prior to return home.   Follow Up Recommendations  CIR;Supervision/Assistance - 24 hour     Equipment Recommendations  Rolling walker with 5" wheels;3in1 (PT);Wheelchair (measurements PT);Wheelchair cushion (measurements PT)    Recommendations for Other Services Rehab consult     Precautions / Restrictions Precautions Precautions: Shoulder;Fall Precaution Comments: Per Dr Tamera Punt (pt s/p Lt RCR early June) NWB Lt UE except to assist with standing and able to use RW; No pulling or pushing with Lt UE;  unrestricted PROM/AROM is okay.  No strengthening   Restrictions Weight Bearing Restrictions: Yes LUE Weight Bearing: Non weight bearing (Except to help with standing and using RW)    Mobility  Bed Mobility Overal bed mobility: Needs Assistance Bed Mobility: Supine to Sit     Supine to sit: Mod assist     General bed mobility comments: Mod assist to raise trunk.  Able to maintain sitting balance.  Transfers Overall transfer level: Needs assistance Equipment used: Rolling walker (2 wheeled) Transfers: Sit to/from [facility] Sit to Stand: Max assist;Mod assist;+2 physical assistance Stand pivot transfers: Mod assist;+2 physical assistance       General transfer comment: Verbal cues for hand placement.  Patient required +2 mod assist to stand from elevated bed,  and +2 max assist to stand from lower BSC.  Mod assist to maintain balance in standing.  Patient able to take several shuffle steps with RW and +2 mod assist to transfer bed > BSC and BSC > recliner.  Ambulation/Gait             General Gait Details: Unable   Stairs            Wheelchair Mobility    Modified Rankin (Stroke Patients Only)       Balance Overall balance assessment: Needs assistance;History of Falls         Standing balance support: Bilateral upper extremity supported Standing balance-Leahy Scale: Poor Standing balance comment: heavy reliance on RW and physical assist to maintain static stand with RW.                            Cognition Arousal/Alertness: Awake/alert Behavior During Therapy: Flat affect Overall Cognitive Status: Impaired/Different from baseline Area of Impairment: Orientation;Memory;Following commands                 Orientation Level: Disoriented to;Time;Situation Current Attention Level: Focused Memory: Decreased recall of precautions;Decreased short-term memory Following Commands: Follows one step commands consistently Safety/Judgement: Decreased awareness of safety;Decreased awareness of deficits   Problem Solving: Slow processing;Decreased initiation;Difficulty sequencing        Exercises      General Comments        Pertinent Vitals/Pain Pain Assessment: No/denies pain    Home Living                      Prior Function  PT Goals (current goals can now be found in the care plan section) Progress towards PT goals: Progressing toward goals    Frequency    Min 3X/week      PT Plan Current plan remains appropriate    Co-evaluation              AM-PAC PT "6 Clicks" Daily Activity  Outcome Measure  Difficulty turning over in bed (including adjusting bedclothes, sheets and blankets)?: Unable Difficulty moving from lying on back to sitting on the side of the  bed? : Unable Difficulty sitting down on and standing up from a chair with arms (e.g., wheelchair, bedside commode, etc,.)?: Unable Help needed moving to and from a bed to chair (including a wheelchair)?: A Lot Help needed walking in hospital room?: A Lot Help needed climbing 3-5 steps with a railing? : Total 6 Click Score: 8    End of Session Equipment Utilized During Treatment: Gait belt Activity Tolerance: Patient tolerated treatment well;Patient limited by fatigue Patient left: in chair;with call bell/phone within reach;with chair alarm set;with family/visitor present Nurse Communication: Mobility status PT Visit Diagnosis: Unsteadiness on feet (R26.81);Muscle weakness (generalized) (M62.81)     Time: 5462-7035 PT Time Calculation (min) (ACUTE ONLY): 32 min  Charges:  $Therapeutic Activity: 23-37 mins                    G Codes:       Carita Pian. Sanjuana Kava, [facility] Acute Rehab Services Pager [place] 08/06/2017, 2:53 PM

## 2017-08-06 NOTE — Progress Notes (Signed)
Patient ID: Brandon [name], male   DOB: 03/26/1946, 71 y.o.   MRN: [id number]     Advanced Heart Failure Rounding Note   Subjective:    8/3 developed progressive respiratory distress => Bipap => intubation.  With sedation, became hypotensive and bradycardic, dopamine + phenylephrine started.  Developed AKI. Hyperkalemia treated with HCO3 gtt and Kayexalate.  Also with metabolic acidosis with elevated lactate (HCO3 gtt begun).  LFTs rose to the 1000s range.  Extubated 07/25/17.  Cor-Track placed 07/26/17, getting tube feeds.   CT head (8/23) with old stroke on right, nothing acute.   Sitting up in chair. Much more alert. Oriented. Denies SOB. Off O2 today. Still NPO. Getting TFs. CVP 5-6   Antimicrobials this admission: 8/9 Cefepime >>8/12 8/10 Vancomycin >>8/12 8/12 Cipro >> 8/18  Microbiology results: 8/2 MRSA: negative 8/3 BCx: NGTD 8/8 TA: few S. Aureus, few Enterobacter - both S to cipro  Studies:  ECHO (5/18): EF 55-60%, moderate AS, moderate AI, moderate MR.   ECHO (8/18): EF 55-60%, moderate LVH, moderate AS mean 33, moderate AI, moderate-severe MR  Objective:   Weight Range: 103.1 kg (227 lb 6.4 oz) Body mass index is 29.2 kg/m.   Vital Signs:   Temp:  [97.6 F (36.4 C)-98.1 F (36.7 C)] 97.7 F (36.5 C) (08/26 1137) Pulse Rate:  [67-84] 67 (08/26 1137) Resp:  [11-20] 18 (08/26 1137) BP: (126-152)/(53-106) 141/60 (08/26 1137) SpO2:  [91 %-100 %] 95 % (08/26 1137) Weight:  [103.1 kg (227 lb 6.4 oz)] 103.1 kg (227 lb 6.4 oz) (08/26 0353) Last BM Date: 08/03/17  Weight change: Filed Weights   08/04/17 0500 08/05/17 0349 08/06/17 0353  Weight: 103.1 kg (227 lb 4.7 oz) 104.3 kg (230 lb) 103.1 kg (227 lb 6.4 oz)   Intake/Output:   Intake/Output Summary (Last 24 hours) at 08/06/17 1345 Last data filed at 08/06/17 0721  Gross per 24 hour  Intake          2221.75 ml  Output             2550 ml  Net          -328.25 ml    Physical Exam   CVP  5-6 General:  Sitting up in chair. No resp difficulty HEENT: normal x for poor dentition and NGT Neck: supple. no JVD. Carotids 2+ bilat; no bruits. No lymphadenopathy or thryomegaly appreciated. Cor: PMI nondisplaced. Regular rate & rhythm. 2/6 AS  Lungs: clear Abdomen: obese soft, nontender, nondistended. No hepatosplenomegaly. No bruits or masses. Good bowel sounds. Extremities: no cyanosis, clubbing, rash, edema Neuro: alert & orientedx3, cranial nerves grossly intact. moves all 4 extremities w/o difficulty. Affect pleasant    Telemetry   NSR 60-70s. +PACs personally reviewed.   Labs    CBC  Recent Labs  08/05/17 0500 08/06/17 0345  WBC 9.2 13.6*  NEUTROABS 7.4 11.8*  HGB 10.3* 7.7*  HCT 34.7* 25.3*  MCV 99.7 100.4*  PLT 260 973   Basic Metabolic Panel  Recent Labs  08/05/17 0500 08/06/17 0345  NA 144 142  K 4.5 4.3  CL 110 110  CO2 27 26  GLUCOSE 127* 145*  BUN 40* 43*  CREATININE 1.15 1.10  CALCIUM 8.6* 8.6*  MG 2.3 2.2   Liver Function Tests  Recent Labs  08/05/17 0500 08/06/17 0345  AST 52* 45*  ALT 54 54  ALKPHOS 47 48  BILITOT 0.8 1.0  PROT 5.8* 5.9*  ALBUMIN 2.3* 2.4*   No results for  input(s): LIPASE, AMYLASE in the last 72 hours. Cardiac Enzymes No results for input(s): CKTOTAL, CKMB, CKMBINDEX, TROPONINI in the last 72 hours.  BNP: BNP (last 3 results)  Recent Labs  07/13/17 1341  BNP 408.4*    ProBNP (last 3 results) No results for input(s): PROBNP in the last 8760 hours.   D-Dimer No results for input(s): DDIMER in the last 72 hours. Hemoglobin A1C No results for input(s): HGBA1C in the last 72 hours. Fasting Lipid Panel No results for input(s): CHOL, HDL, LDLCALC, TRIG, CHOLHDL, LDLDIRECT in the last 72 hours. Thyroid Function Tests  Recent Labs  08/04/17 2311  TSH 3.239    Other results:   Imaging   Transthoracic Echocardiography 07/14/17 Study Conclusions  - Left ventricle: The cavity size was  moderately dilated. There was   moderate concentric hypertrophy. Systolic function was normal.   The estimated ejection fraction was in the range of 55% to 60%.   There is akinesis of the basalinferior myocardium. Features are   consistent with a pseudonormal left ventricular filling pattern,   with concomitant abnormal relaxation and increased filling   pressure (grade 2 diastolic dysfunction). Doppler parameters are   consistent with high ventricular filling pressure. - Aortic valve: Cusp separation was reduced. The peak aortic   velocity is elevated at 4.61m/sec but this is most likely high   due to increased flow across the AV from moderate AR. The mean   gradient of 53mmHg and VTI ratio of the LVOT/AV is most   consistent with moderate stenosis. There was moderate   regurgitation. Mean gradient (S): 33 mm Hg. VTI ratio of LVOT to   aortic valve: 0.27. Valve area (VTI): 0.85 cm^2. Valve area   (Vmax): 0.9 cm^2. Valve area (Vmean): 1.11 cm^2. Regurgitation   pressure half-time: 379 ms. - Mitral valve: Calcified annulus. Visually there appears to be   moderate to severe regurgitation. The PISA calculation does   appear accurate (mild MR by PISA). Valve area by pressure   half-time: 2.22 cm^2. Valve area by continuity equation (using   LVOT flow): 1.91 cm^2. - Left atrium: The atrium was moderately dilated. - Pulmonic valve: There was trivial regurgitation. - Pulmonary arteries: Systolic pressure could not be accurately   estimated.  Impressions:  - Compared to echo 04/2017, the mean AV gradient and peak AV   velocity are essentially unchanged.  Recommendations:  Consider TEE to evaluate MR further. Visually appears to be at least moderate to severe MR.    Medications:     Scheduled Medications: . amiodarone  200 mg Per Tube BID  . apixaban  5 mg Per Tube BID  . chlorhexidine  15 mL Mouth Rinse BID  . Chlorhexidine Gluconate Cloth  6 each Topical Daily  . feeding  supplement (PRO-STAT SUGAR FREE 64)  30 mL Per Tube TID  . free water  300 mL Per Tube Q6H  . furosemide  40 mg Per Tube Daily  . insulin aspart  0-20 Units Subcutaneous Q4H  . mouth rinse  15 mL Mouth Rinse q12n4p  . pantoprazole sodium  40 mg Per Tube Daily  . potassium chloride  40 mEq Oral Daily  . predniSONE  40 mg Oral Q breakfast    Infusions: . feeding supplement (JEVITY 1.2 CAL) 65 mL/hr at 08/05/17 1921    PRN Medications: fentaNYL (SUBLIMAZE) injection, glycopyrrolate, metoprolol tartrate, midazolam, nitroGLYCERIN, ondansetron (ZOFRAN) IV, sodium chloride flush    Patient Profile   70 yo with valvular heart  disease and smoking/suspected COPD, CVA with carotid stenting was admitted with dyspnea/respiratory distress.   Assessment/Plan   1. Acute hypoxemic respiratory failure: Probable mix of pulmonary edema, PNA and COPD flare.  Extubated 07/25/17.  CVP 5. PCT not elevated, afebrile.  He had a course of prednisone and has completed abx.  - Volume status stable. CVP 5-6 - Continue lasix 40 mg daily. Can give extra as needed - Abx stopped 07/29/17. (10 day course  for suspected PNA => trach aspirate with Enterobacter and Staph.  ) 2. Acute on chronic diastolic CHF: Baseline has at least moderate valvular disease (moderate AS, moderate AI, moderate-severe MR).  Suspect diastolic CHF triggered by poorly tolerated paroxysmal atrial fibrillation in setting of valvular disease.  Admission thought to be triggered by diastolic CHF/pulmonary edema.   He developed AKI with lactic acidosis in the setting of hypotension with sedation. CVP 8 today after good diuresis again yesterday. - continue po lasix as above. CVP stable 5-6. Weight stable at 227.  - Per Dr. Aundra Dubin he will need eventual RHC/LHC but will hold off for now given altered mental status and severe debilitation.  Will try to get to CIR, then arrange for TEE to evaluate valves+ RHC/LHC when he is more recovered.   3. Shock:  Probably mixed cardiogenic/vasodilatory.  Developed in the setting of sedation with Bipap then intubation as well as bradycardia with sedation.  Resolved, has been off pressors.  - No change.  4. AKI: With hyperkalemia (treated) in setting of shock. Suspect ATN.   - Resolves. Cr 1.10 today 5. Elevated LFTs: Improved, likely shock liver.  - Resolved 6. Atrial fibrillation: Paroxysmal. Remains in NSR with frequent PACs on po amio now. - Continue Eliquis,  7. Anemia:  FOBT negative 8/7. Transferrin saturation was 3% on 8/5. 1u RBCs on 8/17, had feraheme 8/21.   - hgb 7.7 today. Drifting down. May need more blood tomorrow. Get T&S in am.  8. ID: Blx CX NGTD. Suspected PNA.  On vancomycin/cefepime initially, then ciprofloxacin.  Stopped 07/30/17 after 10 day course. Wbc down.  PCT was not elevated 8/21. - no change.  9. COPD: Suspected.  Active smoker.  Was on prednisone to cover exacerbation.  - Steroids now complete 10. Valvular heart disease: Echo this admission with moderate AS, moderate AI, moderate to severe MR.  May eventually need consideration of surgical valve repair/replacement with Maze given poorly tolerated afib.  - Eventually should have TEE, but would like to see mental status improved. It is possible that this presentation is driven by valvular disease + atrial fibrillation triggering CHF.  He will need a lot of rehab before he is strong enough for heart surgery. As above, likely would get to CIR then when he has recovered arrange for TEE + LHC/RHC.  - No change.  11. Hypernatremia: Na improved at 142. Decrease free water.  Can stop when able to take po.  12. Delirium: Ongoing. Follows commands, moves all extremities but disoriented.  Head CT 8/23 with old right-sided CVA (remote), no acute changes.    - Improved today. Son present to re-orient.  84.  H/o CVA: Has history of right MCA territory CVA.  - No change.  14. Now DNR/limited code - Successfully extubated 07/25/17.   -  Family would not want CPR or intubation. Pressors ok  - No change.  15. Dysphagia:  - Seen by speech yesterday remains NPO.  Continue tube feeds.  - hopefully can re-evalate in am 16. Deconditioning: Marked.  Needs much PT.   - Seen by inpatient rehab, plan to follow medical readiness. Suspect he may be ready tomorrow. (8/27)  17. R wrist gout - started prednisone 40mg  x 3 days on 8/25. Much improved.   Length of Stay: 23  Glori Bickers, MD  08/06/2017, 1:45 PM  Advanced Heart Failure Team Pager [phone] (M-F; 7a - 4p)  Please contact [facility] for night-coverage after hours (4p -7a ) and weekends on [email or web address]

## 2017-08-06 NOTE — Plan of Care (Signed)
Problem: Activity: Goal: Risk for activity intolerance will decrease Outcome: Progressing Pt up with physical therapy. Taking steps and pivoting to chair with assist. Cont movement with physical therapy.

## 2017-08-07 ENCOUNTER — Inpatient Hospital Stay (HOSPITAL_COMMUNITY): Payer: Medicare HMO

## 2017-08-07 ENCOUNTER — Encounter (HOSPITAL_COMMUNITY): Payer: Self-pay | Admitting: *Deleted

## 2017-08-07 ENCOUNTER — Inpatient Hospital Stay (HOSPITAL_COMMUNITY)
Admission: RE | Admit: 2017-08-07 | Discharge: 2017-08-22 | DRG: 056 | Disposition: A | Discharge status: Home Health | Payer: Medicare HMO | Source: Intra-hospital | Attending: Physical Medicine & Rehabilitation | Admitting: Physical Medicine & Rehabilitation

## 2017-08-07 DIAGNOSIS — Z720 Tobacco use: Secondary | ICD-10-CM

## 2017-08-07 DIAGNOSIS — Z713 Dietary counseling and surveillance: Secondary | ICD-10-CM

## 2017-08-07 DIAGNOSIS — R1310 Dysphagia, unspecified: Secondary | ICD-10-CM | POA: Diagnosis present

## 2017-08-07 DIAGNOSIS — D631 Anemia in chronic kidney disease: Secondary | ICD-10-CM | POA: Diagnosis present

## 2017-08-07 DIAGNOSIS — I130 Hypertensive heart and chronic kidney disease with heart failure and stage 1 through stage 4 chronic kidney disease, or unspecified chronic kidney disease: Secondary | ICD-10-CM | POA: Diagnosis present

## 2017-08-07 DIAGNOSIS — Z6831 Body mass index (BMI) 31.0-31.9, adult: Secondary | ICD-10-CM | POA: Diagnosis not present

## 2017-08-07 DIAGNOSIS — I69391 Dysphagia following cerebral infarction: Secondary | ICD-10-CM | POA: Diagnosis not present

## 2017-08-07 DIAGNOSIS — I5033 Acute on chronic diastolic (congestive) heart failure: Secondary | ICD-10-CM | POA: Diagnosis not present

## 2017-08-07 DIAGNOSIS — Z8673 Personal history of transient ischemic attack (TIA), and cerebral infarction without residual deficits: Secondary | ICD-10-CM

## 2017-08-07 DIAGNOSIS — Z79899 Other long term (current) drug therapy: Secondary | ICD-10-CM

## 2017-08-07 DIAGNOSIS — D62 Acute posthemorrhagic anemia: Secondary | ICD-10-CM | POA: Diagnosis not present

## 2017-08-07 DIAGNOSIS — M545 Low back pain: Secondary | ICD-10-CM

## 2017-08-07 DIAGNOSIS — D638 Anemia in other chronic diseases classified elsewhere: Secondary | ICD-10-CM | POA: Diagnosis not present

## 2017-08-07 DIAGNOSIS — J449 Chronic obstructive pulmonary disease, unspecified: Secondary | ICD-10-CM | POA: Diagnosis present

## 2017-08-07 DIAGNOSIS — Z23 Encounter for immunization: Secondary | ICD-10-CM | POA: Diagnosis not present

## 2017-08-07 DIAGNOSIS — R5381 Other malaise: Secondary | ICD-10-CM | POA: Diagnosis not present

## 2017-08-07 DIAGNOSIS — Z9889 Other specified postprocedural states: Secondary | ICD-10-CM | POA: Diagnosis not present

## 2017-08-07 DIAGNOSIS — E669 Obesity, unspecified: Secondary | ICD-10-CM | POA: Diagnosis present

## 2017-08-07 DIAGNOSIS — I69331 Monoplegia of upper limb following cerebral infarction affecting right dominant side: Secondary | ICD-10-CM | POA: Diagnosis not present

## 2017-08-07 DIAGNOSIS — I4891 Unspecified atrial fibrillation: Secondary | ICD-10-CM | POA: Diagnosis present

## 2017-08-07 DIAGNOSIS — Z823 Family history of stroke: Secondary | ICD-10-CM

## 2017-08-07 DIAGNOSIS — E875 Hyperkalemia: Secondary | ICD-10-CM | POA: Diagnosis not present

## 2017-08-07 DIAGNOSIS — Z974 Presence of external hearing-aid: Secondary | ICD-10-CM

## 2017-08-07 DIAGNOSIS — I080 Rheumatic disorders of both mitral and aortic valves: Secondary | ICD-10-CM | POA: Diagnosis present

## 2017-08-07 DIAGNOSIS — G8929 Other chronic pain: Secondary | ICD-10-CM | POA: Diagnosis not present

## 2017-08-07 DIAGNOSIS — G9340 Encephalopathy, unspecified: Secondary | ICD-10-CM | POA: Diagnosis present

## 2017-08-07 DIAGNOSIS — Z4789 Encounter for other orthopedic aftercare: Secondary | ICD-10-CM | POA: Diagnosis not present

## 2017-08-07 DIAGNOSIS — R1312 Dysphagia, oropharyngeal phase: Secondary | ICD-10-CM | POA: Diagnosis not present

## 2017-08-07 DIAGNOSIS — F17210 Nicotine dependence, cigarettes, uncomplicated: Secondary | ICD-10-CM | POA: Diagnosis not present

## 2017-08-07 DIAGNOSIS — I959 Hypotension, unspecified: Secondary | ICD-10-CM | POA: Diagnosis not present

## 2017-08-07 DIAGNOSIS — F4323 Adjustment disorder with mixed anxiety and depressed mood: Secondary | ICD-10-CM

## 2017-08-07 DIAGNOSIS — E785 Hyperlipidemia, unspecified: Secondary | ICD-10-CM | POA: Diagnosis not present

## 2017-08-07 DIAGNOSIS — Z7901 Long term (current) use of anticoagulants: Secondary | ICD-10-CM

## 2017-08-07 DIAGNOSIS — N183 Chronic kidney disease, stage 3 unspecified: Secondary | ICD-10-CM

## 2017-08-07 DIAGNOSIS — F329 Major depressive disorder, single episode, unspecified: Secondary | ICD-10-CM | POA: Diagnosis present

## 2017-08-07 DIAGNOSIS — K219 Gastro-esophageal reflux disease without esophagitis: Secondary | ICD-10-CM | POA: Diagnosis present

## 2017-08-07 DIAGNOSIS — F5102 Adjustment insomnia: Secondary | ICD-10-CM | POA: Diagnosis not present

## 2017-08-07 DIAGNOSIS — I509 Heart failure, unspecified: Secondary | ICD-10-CM | POA: Diagnosis not present

## 2017-08-07 LAB — BASIC METABOLIC PANEL
ANION GAP: 7 (ref 5–15)
BUN: 49 mg/dL — ABNORMAL HIGH (ref 6–20)
CHLORIDE: 106 mmol/L (ref 101–111)
CO2: 26 mmol/L (ref 22–32)
CREATININE: 1.04 mg/dL (ref 0.61–1.24)
Calcium: 8.5 mg/dL — ABNORMAL LOW (ref 8.9–10.3)
GFR calc non Af Amer: >60 mL/min (ref >60)
Glucose, Bld: 130 mg/dL — ABNORMAL HIGH (ref 65–99)
POTASSIUM: 4 mmol/L (ref 3.5–5.1)
SODIUM: 139 mmol/L (ref 135–145)

## 2017-08-07 LAB — CBC WITH DIFFERENTIAL/PLATELET
BASOS ABS: 0 10*3/uL (ref 0.0–0.1)
BASOS PCT: 0 %
Eosinophils Absolute: 0.1 10*3/uL (ref 0.0–0.7)
Eosinophils Relative: 1 %
HEMATOCRIT: 24.8 % — AB (ref 39.0–52.0)
HEMOGLOBIN: 7.4 g/dL — AB (ref 13.0–17.0)
LYMPHS PCT: 14 %
Lymphs Abs: 1.8 10*3/uL (ref 0.7–4.0)
MCH: 30 pg (ref 26.0–34.0)
MCHC: 29.8 g/dL — ABNORMAL LOW (ref 30.0–36.0)
MCV: 100.4 fL — AB (ref 78.0–100.0)
Monocytes Absolute: 1.1 10*3/uL — ABNORMAL HIGH (ref 0.1–1.0)
Monocytes Relative: 8 %
NEUTROS ABS: 10.2 10*3/uL — AB (ref 1.7–7.7)
NEUTROS PCT: 77 %
Platelets: 373 10*3/uL (ref 150–400)
RBC: 2.47 MIL/uL — AB (ref 4.22–5.81)
RDW: 19.5 % — AB (ref 11.5–15.5)
WBC: 13.2 10*3/uL — ABNORMAL HIGH (ref 4.0–10.5)

## 2017-08-07 LAB — MAGNESIUM: Magnesium: 2.3 mg/dL (ref 1.7–2.4)

## 2017-08-07 LAB — GLUCOSE, CAPILLARY
GLUCOSE-CAPILLARY: 112 mg/dL — AB (ref 65–99)
GLUCOSE-CAPILLARY: 116 mg/dL — AB (ref 65–99)
GLUCOSE-CAPILLARY: 118 mg/dL — AB (ref 65–99)
GLUCOSE-CAPILLARY: 126 mg/dL — AB (ref 65–99)
GLUCOSE-CAPILLARY: 151 mg/dL — AB (ref 65–99)

## 2017-08-07 LAB — PREPARE RBC (CROSSMATCH)

## 2017-08-07 MED ORDER — LORAZEPAM 0.5 MG PO TABS
0.5000 mg | ORAL_TABLET | Freq: Four times a day (QID) | ORAL | Status: DC | PRN
  Administered 2017-08-07 – 2017-08-14 (×7): 0.5 mg via ORAL
  Filled 2017-08-07 (×8): qty 1

## 2017-08-07 MED ORDER — PANTOPRAZOLE SODIUM 40 MG PO PACK: 40.0000 mg | PACK | Freq: Every day | ORAL | Status: DC

## 2017-08-07 MED ORDER — FREE WATER: 200.0000 mL | Freq: Four times a day (QID) | Status: DC

## 2017-08-07 MED ORDER — POTASSIUM CHLORIDE 20 MEQ/15ML (10%) PO SOLN
40.0000 meq | Freq: Every day | ORAL | Status: DC
  Administered 2017-08-08 – 2017-08-14 (×3): 40 meq via ORAL
  Filled 2017-08-07 (×4): qty 30

## 2017-08-07 MED ORDER — OXYCODONE-ACETAMINOPHEN 5-325 MG PO TABS
1.0000 | ORAL_TABLET | ORAL | Status: DC | PRN
  Administered 2017-08-07 – 2017-08-21 (×9): 2 via ORAL
  Filled 2017-08-07 (×10): qty 2

## 2017-08-07 MED ORDER — APIXABAN 5 MG PO TABS: 5.0000 mg | ORAL_TABLET | Freq: Two times a day (BID) | ORAL | Status: DC

## 2017-08-07 MED ORDER — AMIODARONE HCL 200 MG PO TABS
200.0000 mg | ORAL_TABLET | Freq: Two times a day (BID) | ORAL | Status: DC
  Administered 2017-08-07 – 2017-08-22 (×30): 200 mg via ORAL
  Filled 2017-08-07 (×30): qty 1

## 2017-08-07 MED ORDER — JEVITY 1.2 CAL PO LIQD
1000.0000 mL | ORAL | Status: DC
  Filled 2017-08-07 (×2): qty 1000

## 2017-08-07 MED ORDER — PNEUMOCOCCAL VAC POLYVALENT 25 MCG/0.5ML IJ INJ
0.5000 mL | INJECTION | INTRAMUSCULAR | Status: AC
  Administered 2017-08-08: 0.5 mL via INTRAMUSCULAR
  Filled 2017-08-07 (×2): qty 0.5

## 2017-08-07 MED ORDER — APIXABAN 5 MG PO TABS
5.0000 mg | ORAL_TABLET | Freq: Two times a day (BID) | ORAL | Status: DC
  Administered 2017-08-07 – 2017-08-22 (×30): 5 mg via ORAL
  Filled 2017-08-07 (×30): qty 1

## 2017-08-07 MED ORDER — PANTOPRAZOLE SODIUM 40 MG PO PACK: 40.0000 mg | PACK | Freq: Every day | ORAL | 12 refills | Status: DC

## 2017-08-07 MED ORDER — NITROGLYCERIN 0.4 MG SL SUBL: 0.4000 mg | SUBLINGUAL_TABLET | SUBLINGUAL | Status: DC | PRN

## 2017-08-07 MED ORDER — JEVITY 1.2 CAL PO LIQD: 1000.0000 mL | ORAL | 0 refills | Status: DC

## 2017-08-07 MED ORDER — FUROSEMIDE 40 MG PO TABS: 40.0000 mg | ORAL_TABLET | Freq: Every day | ORAL | Status: DC

## 2017-08-07 MED ORDER — PRO-STAT SUGAR FREE PO LIQD
30.0000 mL | Freq: Three times a day (TID) | ORAL | Status: DC
  Administered 2017-08-07 – 2017-08-17 (×17): 30 mL via ORAL
  Filled 2017-08-07 (×18): qty 30

## 2017-08-07 MED ORDER — POTASSIUM CHLORIDE 20 MEQ/15ML (10%) PO SOLN: 40.0000 meq | Freq: Every day | ORAL | 0 refills | Status: DC

## 2017-08-07 MED ORDER — AMIODARONE HCL 200 MG PO TABS: 200.0000 mg | ORAL_TABLET | Freq: Two times a day (BID) | ORAL | Status: DC

## 2017-08-07 MED ORDER — APIXABAN 5 MG PO TABS: 5.0000 mg | ORAL_TABLET | Freq: Two times a day (BID) | ORAL | 12 refills | Status: DC

## 2017-08-07 MED ORDER — FREE WATER: 200.0000 mL | Freq: Four times a day (QID) | 0 refills | Status: DC

## 2017-08-07 MED ORDER — PRO-STAT SUGAR FREE PO LIQD: 30.0000 mL | Freq: Three times a day (TID) | ORAL | Status: DC

## 2017-08-07 MED ORDER — TRAZODONE HCL 50 MG PO TABS
50.0000 mg | ORAL_TABLET | Freq: Every day | ORAL | Status: DC
  Administered 2017-08-07 – 2017-08-08 (×2): 50 mg via ORAL
  Filled 2017-08-07 (×2): qty 1

## 2017-08-07 MED ORDER — FUROSEMIDE 40 MG PO TABS
40.0000 mg | ORAL_TABLET | Freq: Every day | ORAL | Status: DC
  Administered 2017-08-08 – 2017-08-10 (×3): 40 mg via ORAL
  Filled 2017-08-07 (×3): qty 1

## 2017-08-07 MED ORDER — SODIUM CHLORIDE 0.9 % IV SOLN
Freq: Once | INTRAVENOUS | Status: AC
  Administered 2017-08-07: 08:00:00 via INTRAVENOUS

## 2017-08-07 MED ORDER — RESOURCE THICKENUP CLEAR PO POWD
ORAL | Status: DC | PRN
  Filled 2017-08-07: qty 125

## 2017-08-07 MED ORDER — PRO-STAT SUGAR FREE PO LIQD: 30.0000 mL | Freq: Three times a day (TID) | ORAL | 0 refills | Status: DC

## 2017-08-07 MED ORDER — FUROSEMIDE 40 MG PO TABS: 40.0000 mg | ORAL_TABLET | Freq: Every day | ORAL | 12 refills | Status: DC

## 2017-08-07 MED ORDER — INSULIN ASPART 100 UNIT/ML ~~LOC~~ SOLN: 0.0000 [IU] | SUBCUTANEOUS | Status: DC

## 2017-08-07 MED ORDER — GLYCOPYRROLATE 0.2 MG/ML IJ SOLN: 0.2000 mg | INTRAMUSCULAR | 12 refills | Status: DC | PRN

## 2017-08-07 MED ORDER — PANTOPRAZOLE SODIUM 40 MG PO PACK
40.0000 mg | PACK | Freq: Every day | ORAL | Status: DC
  Administered 2017-08-08 – 2017-08-16 (×9): 40 mg via ORAL
  Filled 2017-08-07 (×5): qty 20

## 2017-08-07 MED ORDER — PREDNISONE 20 MG PO TABS: 40.0000 mg | ORAL_TABLET | Freq: Every day | ORAL | 0 refills | Status: DC

## 2017-08-07 MED ORDER — AMIODARONE HCL 200 MG PO TABS: 200.0000 mg | ORAL_TABLET | Freq: Two times a day (BID) | ORAL | 12 refills | Status: DC

## 2017-08-07 MED ORDER — INSULIN ASPART 100 UNIT/ML ~~LOC~~ SOLN
0.0000 [IU] | Freq: Three times a day (TID) | SUBCUTANEOUS | Status: DC
  Administered 2017-08-11 – 2017-08-14 (×3): 3 [IU] via SUBCUTANEOUS
  Administered 2017-08-15: 4 [IU] via SUBCUTANEOUS
  Administered 2017-08-16 – 2017-08-19 (×4): 3 [IU] via SUBCUTANEOUS
  Administered 2017-08-20: 1 [IU] via SUBCUTANEOUS
  Administered 2017-08-20: 3 [IU] via SUBCUTANEOUS

## 2017-08-07 MED ORDER — CHLORHEXIDINE GLUCONATE 0.12 % MT SOLN: 15.0000 mL | Freq: Two times a day (BID) | OROMUCOSAL | 0 refills | Status: DC

## 2017-08-07 NOTE — Progress Notes (Signed)
  Speech Language Pathology Treatment: Dysphagia  Patient Details Name: Brandon [name] MRN: [id number] DOB: [birth year] Today's Date: 08/07/2017 Time: 6720-9470 SLP Time Calculation (min) (ACUTE ONLY): 20 min  Assessment / Plan / Recommendation Clinical Impression  Pt with much improved oral hygiene today - oral care provided prior to PO assessment.  Status of oral cavity was excellent.  Pt's voice stronger with better quality and volume.  Confusion persists, but he is much more talkative, engages with examiner, with improved intelligibility of speech.  Provided with ice chip trials, small sips water - delayed cough present, but it is stronger and pt is better able to mobilize secretions.  He is ready for repeat swallow study - MBS is scheduled for 1:30 today prior to D/C to CIR.  D/W wife, CIR admission coordinator.    HPI HPI: 71 yo with valvular heart disease and smoking/suspected COPD, CVA with carotid stenting was admitted with dyspnea/respiratory distress 8/2. Found to be in atrial fibrillation with rapid ventricular response and volume overload. Placed on Bipap but required intubated 8/3-8/14. MRI chronic right CVA.  FEES 8/17 with findings of reversible dysphagia s/p intubation with high aspiration risk. Pt has been NPO with cortrak.       SLP Plan  MBS       Recommendations  Diet recommendations: NPO                Oral Care Recommendations: Oral care QID Follow up Recommendations: Inpatient Rehab SLP Visit Diagnosis: Dysphagia, pharyngeal phase (R13.13) Plan: MBS       GO                Brandon [name] 08/07/2017, 10:51 AM  [name], [place] CCC/SLP Pager [phone]

## 2017-08-07 NOTE — Progress Notes (Signed)
Modified Barium Swallow Progress Note  Patient Details  Name: Brandon [name] MRN: [id number] Date of Birth: [birth year]  Today's Date: 08/07/2017  Modified Barium Swallow completed.  Full report located under Chart Review in the Imaging Section.  Brief recommendations include the following:  Clinical Impression  Pt shows improved oropharyngeal function since FEES on 8/17. Orally he has decreased bolus cohesion with some premature spillage and some need for second swallows to clear oral residuals. Pharyngeally his strength is improved, with only mild-moderate residue remaining post-swallow, which was reduced with a second swallow. Pt had silent penetration with thin liquids and nectar thick liquids by straw due to impaired timing and likely some degree of reduced glottal closure. Penetration does not occur consistently and but when it does it is silent, requiring cued cough/re-swallow to clear. Given his impulsivity with intake and overall mentation, recomemend initiating a Dys 2 diet and nectar thick liquids by cup. He will benefit from additional SLP f/u while on CIR.    Swallow Evaluation Recommendations       SLP Diet Recommendations: Dysphagia 2 (Fine chop) solids;Nectar thick liquid   Liquid Administration via: Cup;No straw   Medication Administration: Crushed with puree   Supervision: Patient able to self feed;Full supervision/cueing for compensatory strategies   Compensations: Minimize environmental distractions;Slow rate;Small sips/bites;Multiple dry swallows after each bite/sip   Postural Changes: Remain semi-upright after after feeds/meals (Comment);Seated upright at 90 degrees   Oral Care Recommendations: Oral care BID   Other Recommendations: Order thickener from pharmacy;Prohibited food (jello, ice cream, thin soups);Remove water pitcher    Germain Osgood 08/07/2017,4:35 PM   Germain Osgood, M.A. CCC-SLP [phone]

## 2017-08-07 NOTE — Plan of Care (Signed)
Problem: Education: Goal: Knowledge of [facility] Education information/materials will improve Outcome: Progressing RN explained plan of care to patient.  Difficult to assess understanding at this time due to patient can be forgetful.

## 2017-08-07 NOTE — Progress Notes (Signed)
Late entry. Patient admitted to unit with cortrak tube in right nares. Orders received to discontinue tube and start on dysphasia diet nectar thick liquids. cortrak tube discontinued at approximatey 1730. Patient tolerated well. Patient tolerated dysphasia 2 nectar thick liquid diet with cueing to follow restrictions. Patient now on calorie count x 72 hours. Continue with plan of care.  Mliss Sax

## 2017-08-07 NOTE — Progress Notes (Signed)
CSW alerted that patient will be admitted to CIR. CSW signing off as patient will not need SNF.  Estanislado Emms, [place]

## 2017-08-07 NOTE — Progress Notes (Signed)
Brandon Arn, MD Physician Signed Physical Medicine and Rehabilitation  PMR Pre-admission Date of Service: 08/07/2017 11:59 AM  Related encounter: ED to Hosp-Admission (Current) from 07/13/2017 in [facility]       [] Hide copied text PMR Admission Coordinator Pre-Admission Assessment  Patient: Brandon [name] is an 71 y.o., male MRN: [id number] DOB: [birth year] Height: 6\' 2"  (188 cm) Weight: 101.9 kg (224 lb 9.6 oz)                                                                                                                                                  Insurance Information HMO: X    PPO:      PCP:      IPA:      80/20:      OTHER:  PRIMARY: [employer]       Policy#: [id number]      Subscriber: Self CM Name: Brandon [name]       Phone#: [phone]     Fax#: [phone] Pre-Cert#: [id number] given by Brandon [name] Phone # :[phone] with request for weekly updates to be provided to CM     Employer: Retired  Benefits:  Phone #: [phone]     Name: Brandon [name], Reference #[id number] Eff. Date: 12/12/16     Deduct: $0      Out of Pocket Max: $5900      Life Max: N/A CIR: $295 a day, days 1-6; $0 a day, days 7+      SNF: $0 a day, days 1-20; $167 a day, days 21-100 Outpatient: PT/OT/SLP     Co-Pay: $10 per visit  Home Health: PT/OT/SLP 100%      Co-Pay: none DME: 80%     Co-Pay: 20% Providers: In-network   SECONDARY: [employer]      Policy#: [id number]      Subscriber: Self CM Name:       Phone#:      Fax#:  Pre-Cert#:       Employer: Retired  Benefits:  Phone #: [phone]     Name:  Eff. Date:      Deduct:       Out of Pocket Max:       Life Max:  CIR:       SNF:  Outpatient:      Co-Pay:  Home Health:       Co-Pay:  DME:      Co-Pay:   Medicaid Application Date:       Case Manager:  Disability Application Date:       Case Worker:   Emergency Tax adviser Information    Name Relation Home Work Mobile     [place] Spouse [phone]  [phone]     Current Medical History  Patient Admitting Diagnosis:  Debility  History of Present Illness: Brandon Laspina Clingmanis a 71 y.o.right handed malewith history of CVA with carotid stenting, hypertension, aortic stenosis, tobacco abuse, [name], and recent rotator cuff surgery 05/18/2017. Presented 07/13/2017 for nonspecific chest pain, diaphoresis elevated troponin 0.12. Patient lives with spouse reported to be independent prior to admission. One level home with 1 step to entry. Chest x-ray with interval development of pulmonary edema. Treated with intravenous Heparin,Lasix, Lopressor added for bouts of atrial fibrillation with RVR/troponin 0.12and heparin was transitioned to eliquis. He did develop some agitation received Xanax as well as Haldol. Echocardiogram with ejection fraction of 83% grade 2 diastolic dysfunction. Patient did require intubation for acute hypoxic respiratory failure 07/14/2017. TEE showed moderate LVH, moderate AI with moderate to severe mitral regurgitation. Venous Dopplers lower extremities negative for DVT. He was extubated 66/29/[id number] course complicated by suspect pneumonia. Antibiotics completed 07/29/2017 as well as completing course of prednisone. Ongoing monitoring of mental status/Deliriumwith cranial CT/MRIscan 08/04/2017 showed no acute intracranial abnormalities with old remote right MCA territory infarct.Palliative care was consulted to establish goals of care.A Cortrak feddingtube wasplaced08/15/2018for nutritional supportand currently remains NPO.Objective swallow study scheduled for 08/07/17 at 1330. Physical therapy evaluation completed and noting ongoing debilitation. The clarification of weightbearing status of left upper extremity after rotator cuff surgery is nonweightbearing left upper extremity except help with standing and using a rolling walker. No pulling or pushing with left  upper extremity.Acute on chronic anemia 7.4 and monitored with plan for transfusion 1 unit 08/07/2017.M.D. has requested physical medicine rehabilitation consult. Patient was admitted for comprehensive rehabilitation program 08/07/17.   Past Medical History      Past Medical History:  Diagnosis Date  . Anemia 07/13/2017  . Anxiety   . Aortic insufficiency   . Aortic stenosis, moderate 07/13/2017  . Arthritis    back   . Carotid stenosis    Right carotid stent (widely patent) 40 - 59% left plaque 11/13  . Depression   . Dyslipidemia   . GERD (gastroesophageal reflux disease)   . Heart murmur   . Hemiplegia affecting unspecified side, late effect of cerebrovascular disease    resolved- from L side   . Hypertension   . Jaundice    resolved following ERCP & Cholecystectomy  . Mitral valve insufficiency and aortic valve insufficiency   . Pre-diabetes    per spouse  . Sleep apnea    does not wear CPAP  . Sleep concern    resulted in surgery- after + sleep test. Pt. doesn't have a problem any longer.   . Stroke ([place]) 03/11/2003   stent placed on the 31, 3, 2004, L side   . Wears glasses   . Wears hearing aid in both ears   . Wears partial dentures     Family History  family history includes Angina in his mother; Stroke in his father.  Prior Rehab/Hospitalizations:  Has the patient had major surgery during 100 days prior to admission? Yes  Current Medications   Current Facility-Administered Medications:  .  amiodarone (PACERONE) tablet 200 mg, 200 mg, Per Tube, BID, Bensimhon, Shaune Pascal, MD, 200 mg at 08/07/17 1056 .  apixaban (ELIQUIS) tablet 5 mg, 5 mg, Per Tube, BID, Lyndee Leo, RPH, 5 mg at 08/07/17 1056 .  chlorhexidine (PERIDEX) 0.12 % solution 15 mL, 15 mL, Mouth Rinse, BID, Larey Dresser, MD, 15 mL at 08/07/17 1057 .  Chlorhexidine Gluconate Cloth 2 % PADS 6 each, 6 each, Topical, Daily, Loralie Champagne  S, MD, 6 each at 08/07/17  1000 .  feeding supplement (JEVITY 1.2 CAL) liquid 1,000 mL, 1,000 mL, Per Tube, Continuous, Larey Dresser, MD, Last Rate: 65 mL/hr at 08/07/17 0458, 1,000 mL at 08/07/17 0458 .  feeding supplement (PRO-STAT SUGAR FREE 64) liquid 30 mL, 30 mL, Per Tube, TID, Larey Dresser, MD, 30 mL at 08/07/17 1056 .  fentaNYL (SUBLIMAZE) injection 25-100 mcg, 25-100 mcg, Intravenous, G6Y PRN, Pershing Proud, NP, 403 mcg at 08/06/17 2028 .  free water 200 mL, 200 mL, Per Tube, Q6H, Bensimhon, Shaune Pascal, MD, 200 mL at 08/07/17 1200 .  furosemide (LASIX) tablet 40 mg, 40 mg, Per Tube, Daily, Larey Dresser, MD, 40 mg at 08/07/17 1056 .  glycopyrrolate (ROBINUL) injection 0.2 mg, 0.2 mg, Intravenous, K7Q PRN, Pershing Proud, NP, 0.2 mg at 08/03/17 2142 .  insulin aspart (novoLOG) injection 0-20 Units, 0-20 Units, Subcutaneous, Q4H, Collene Gobble, MD, 4 Units at 08/06/17 2022 .  MEDLINE mouth rinse, 15 mL, Mouth Rinse, q12n4p, Larey Dresser, MD, 15 mL at 08/07/17 1200 .  metoprolol tartrate (LOPRESSOR) injection 2.5 mg, 2.5 mg, Intravenous, Q2H PRN, Barrett, Rhonda G, PA-C, 2.5 mg at 07/23/17 0853 .  midazolam (VERSED) bolus via infusion 0.5 mg, 0.5 mg, Intravenous, Q4H PRN, Amie Portland, MD .  nitroGLYCERIN (NITROSTAT) SL tablet 0.4 mg, 0.4 mg, Sublingual, Q5 Min x 3 PRN, Barrett, Rhonda G, PA-C .  ondansetron (ZOFRAN) injection 4 mg, 4 mg, Intravenous, Q6H PRN, Barrett, Rhonda G, PA-C, 4 mg at 07/20/17 1158 .  pantoprazole sodium (PROTONIX) 40 mg/20 mL oral suspension 40 mg, 40 mg, Per Tube, Daily, Shirley Friar, PA-C, 40 mg at 08/07/17 1000 .  potassium chloride 20 MEQ/15ML (10%) solution 40 mEq, 40 mEq, Oral, Daily, Bensimhon, Shaune Pascal, MD, 40 mEq at 08/07/17 1056 .  sodium chloride flush (NS) 0.9 % injection 10-40 mL, 10-40 mL, Intracatheter, PRN, Larey Dresser, MD  Patients Current Diet: Diet NPO time specified Except for: Ice Chips Diet - low sodium heart healthy  Precautions /  Restrictions Precautions Precautions: Shoulder, Fall Type of Shoulder Precautions: see note  Precaution Comments: Per Dr Tamera Punt (pt s/p Lt RCR early June) NWB Lt UE except to assist with standing and able to use RW; No pulling or pushing with Lt UE;  unrestricted PROM/AROM is okay.  No strengthening   Restrictions Weight Bearing Restrictions: Yes LUE Weight Bearing: Non weight bearing   Has the patient had 2 or more falls or a fall with injury in the past year?No  Prior Activity Level Community (5-7x/wk): Patient had been getting wobbly for about two weeks priro to admission.  He was fully independent, driving, mowing, and meeting the guys for breakfast on Tuesdays.    Home Assistive Devices / Equipment Home Assistive Devices/Equipment: None Home Equipment: None  Prior Device Use: Indicate devices/aids used by the patient prior to current illness, exacerbation or injury? None of the above, used a single point cane for two weeks leading up to admission.  Prior Functional Level Prior Function Level of Independence: Independent Comments: Pt was fully independent with ADLs including yard work.  He recently underwent Lt partial repair RTC,per wife (June 2018), and was still undergoing therapy - Dr Tamera Punt   Self Care: Did the patient need help bathing, dressing, using the toilet or eating? Independent  Indoor Mobility: Did the patient need assistance with walking from room to room (with or without device)? Independent  Stairs: Did the  patient need assistance with internal or external stairs (with or without device)? Independent  Functional Cognition: Did the patient need help planning regular tasks such as shopping or remembering to take medications? Independent  Current Functional Level Cognition  Overall Cognitive Status: Impaired/Different from baseline Current Attention Level: Focused Orientation Level: Oriented to person, Oriented to place, Oriented to time,  Disoriented to situation Following Commands: Follows one step commands consistently Safety/Judgement: Decreased awareness of safety, Decreased awareness of deficits General Comments: improvement in cognition today.    Extremity Assessment (includes Sensation/Coordination)  Upper Extremity Assessment: Generalized weakness, LUE deficits/detail, RUE deficits/detail RUE Deficits / Details: Rt UE grossly 3+/5 - 4-/5;  LUE Deficits / Details: Grossly 3-/5.  Pt with recent partial rotator cuff repair   Lower Extremity Assessment: Defer to PT evaluation RLE Deficits / Details: grossly 3-5 LLE Deficits / Details: grossly 2+/5    ADLs  Overall ADL's : Needs assistance/impaired Eating/Feeding: NPO Grooming: Wash/dry hands, Wash/dry face, Applying deodorant, Minimal assistance Upper Body Bathing: Moderate assistance, Cueing for sequencing, Bed level Lower Body Bathing: Total assistance, Bed level Upper Body Dressing : Moderate assistance, Cueing for sequencing, Bed level Lower Body Dressing: Total assistance, Bed level Toilet Transfer: Maximal assistance, +2 for physical assistance, Stand-pivot (stedy ) Toileting- Clothing Manipulation and Hygiene: Sit to/from stand, Total assistance Toileting - Clothing Manipulation Details (indicate cue type and reason): Pt incontinent of stool, and was assisted with peri care  Functional mobility during ADLs: Maximal assistance, +2 for physical assistance (stedy ) General ADL Comments: Pt. was seen for ADL session. Pt. required mod cues to perform tasks. Pt. is HOH which may be impacting pt. ability to follow directions. Pt. was cooperative during session.     Mobility  Overal bed mobility: Needs Assistance Bed Mobility: Supine to Sit Rolling: Mod assist Supine to sit: Mod assist General bed mobility comments: Mod assist to raise trunk.  Able to maintain sitting balance.    Transfers  Overall transfer level: Needs assistance Equipment used: Rolling  walker (2 wheeled) Transfer via Lift Equipment: Stedy Transfers: Sit to/from Stand, Risk manager Sit to Stand: Max assist, Mod assist, +2 physical assistance Stand pivot transfers: Mod assist, +2 physical assistance  Lateral/Scoot Transfers: Mod assist, +2 physical assistance, Max assist General transfer comment: Verbal cues for hand placement.  Patient required +2 mod assist to stand from elevated bed, and +2 max assist to stand from lower BSC.  Mod assist to maintain balance in standing.  Patient able to take several shuffle steps with RW and +2 mod assist to transfer bed > BSC and BSC > recliner.    Ambulation / Gait / Stairs / Wheelchair Mobility  Ambulation/Gait General Gait Details: Unable    Posture / Balance Dynamic Sitting Balance Sitting balance - Comments: Min guard assist to maintain balance EOB.  Balance Overall balance assessment: Needs assistance, History of Falls Sitting-balance support: Feet supported, Bilateral upper extremity supported Sitting balance-Leahy Scale: Fair Sitting balance - Comments: Min guard assist to maintain balance EOB.  Postural control: Left lateral lean, Posterior lean Standing balance support: Bilateral upper extremity supported Standing balance-Leahy Scale: Poor Standing balance comment: heavy reliance on RW and physical assist to maintain static stand with RW.    Special needs/care consideration BiPAP/CPAP: No CPM: No Continuous Drip IV: no Dialysis: No         Life Vest: no Oxygen: None prior to admission; however, now on 3L nasal canula  Special Bed: Yes, high low bed  Trach Size:  No Wound Vac (area): No       Skin: Skin team to left arm and elbow; Moisture associated skin damage to perineum and sacrum Bowel mgmt: Continent, last BM 08/03/17 Bladder mgmt: Incontinent with foley placed  Diabetic mgmt: Borderline per spouse report  Safety: Yes, tele-sitter in use      Previous Home Environment Living Arrangements:  Spouse/significant other Available Help at Discharge: Family, Friend(s), Available 24 hours/day Type of Home: House Home Layout: One level Home Access: Stairs to enter Entrance Stairs-Rails: None Entrance Stairs-Number of Steps: 1 + 1 Bathroom Shower/Tub: Public librarian, Architectural technologist: Programmer, systems: Yes Tangent: No  Discharge Living Setting Plans for Discharge Living Setting: Patient's home, Lives with (comment) (Spouse, Sandra Runquist ) Type of Home at Discharge: Other (Comment) (Double wide house) Discharge Home Layout: One level Discharge Home Access: Other (comment) (small step onto deck/porch then threshold into house ) Discharge Bathroom Shower/Tub: Tub/shower unit Discharge Bathroom Toilet: Standard Discharge Bathroom Accessibility: Yes How Accessible: Accessible via walker Does the patient have any problems obtaining your medications?: No  Social/Family/Support Systems Patient Roles: Spouse Contact Information: Spouse: Duanne Guess  Anticipated Caregiver: Spouse Anticipated Caregiver's Contact Information: home: [phone], cell: [phone] Ability/Limitations of Caregiver: None Caregiver Availability: 24/7 Discharge Plan Discussed with Primary Caregiver: Yes Is Caregiver In Agreement with Plan?: Yes Does Caregiver/Family have Issues with Lodging/Transportation while Pt is in Rehab?: No  Goals/Additional Needs Patient/Family Goal for Rehab: PT/OT/SLP Min-Mod A Expected length of stay: 21-25 days  Cultural Considerations: [employer]  Dietary Needs: Currently NPO with Cortrak feeding tube; however MBS scheduled for 1330 today  Equipment Needs: TBD Special Service Needs: None Pt/Family Agrees to Admission and willing to participate: Yes Program Orientation Provided & Reviewed with Pt/Caregiver Including Roles  & Responsibilities: Yes Additional Information Needs: Left shoulder surgery in June with Dr. Tamera Punt    Information Needs to be Provided By: Team FYI  Decrease burden of Care through IP rehab admission: No  Possible need for SNF placement upon discharge: Yes  Patient Condition: This patient's medical and functional status has changed since the consult dated: 08/02/17 in which the Rehabilitation Physician determined and documented that the patient's condition is appropriate for intensive rehabilitative care in an inpatient rehabilitation facility. See "History of Present Illness" (above) for medical update. Functional changes are: Mod-Max A +2 for transfers. Patient's medical and functional status update has been discussed with the Rehabilitation physician and patient remains appropriate for inpatient rehabilitation. Will admit to inpatient rehab today.  Preadmission Screen Completed By:  Gunnar Fusi, 08/07/2017 11:59 AM ______________________________________________________________________   Discussed status with Dr. Posey Pronto on 08/07/17 at 1220 and received telephone approval for admission today.  Admission Coordinator:  Gunnar Fusi, time 1220/Date 08/07/17       Revision History

## 2017-08-07 NOTE — Progress Notes (Signed)
Kirsteins, Luanna Salk, MD Physician Signed Physical Medicine and Rehabilitation  Consult Note Date of Service: 08/02/2017 9:24 AM  Related encounter: ED to Hosp-Admission (Current) from 07/13/2017 in [facility]     Expand All Collapse All   [] Hide copied text [] Hover for attribution information      Physical Medicine and Rehabilitation Consult Reason for Consult: Decreased functional mobility secondary to acute hypoxic respiratory failure Referring Physician: Dr. Algernon Huxley   HPI: TEDD COTTRILL is a 71 y.o. right handed male with history of CVA, hypertension, aortic stenosis, tobacco abuse, [name] and recent rotator cuff surgery 05/18/2017.  Presented 07/13/2017 for nonspecific chest pain, diaphoresis elevated troponin 0.12. Patient lives with spouse reported to be independent prior to admission. One level home with 1 step to entry. Chest x-ray with interval development of pulmonary edema. Treated with intravenous Lasix Lopressor added for bouts of atrial fibrillation with RVR. He did develop some agitation received Xanax as well as Haldol. Echocardiogram with ejection fraction of 93% grade 2 diastolic dysfunction. Patient did require intubation for acute hypoxic respiratory failure 07/14/2017. TEE showed moderate LVH, moderate AI with moderate to severe mitral regurgitation. Venous Dopplers lower extremities negative for DVT. He was extubated 07/25/2017. Ongoing monitoring of mental status with cranial CT scan today 08/02/2017 pending.. Palliative care was consulted to establish goals of care. A nasogastric tube is in place for nutritional support. Physical therapy evaluation completed and ongoing noting debilitation. The clarification of weightbearing status of left upper extremity after rotator cuff surgery. M.D. has requested physical medicine rehabilitation consult.  According to wife, patient had full recovery from prior stroke. She has noticed  increasing weakness of the right upper extremity, CT scan performed this morning showed no acute infarct. Patient hospitalized since 07/13/2017 and was intubated for greater than 1 week, very poor vocal volume since extubation   Review of Systems  Constitutional: Negative for chills and fever.  HENT: Negative for hearing loss.   Eyes: Negative for blurred vision and double vision.  Respiratory: Positive for cough and shortness of breath.   Cardiovascular: Positive for chest pain, palpitations and leg swelling.  Gastrointestinal: Positive for constipation. Negative for nausea.       GERD  Genitourinary: Positive for urgency. Negative for dysuria, flank pain and hematuria.  Musculoskeletal: Positive for joint pain and myalgias.  Skin: Negative for rash.  Neurological: Positive for weakness.  Psychiatric/Behavioral: Positive for depression. The patient has insomnia.        Anxiety  All other systems reviewed and are negative.      Past Medical History:  Diagnosis Date  . Anemia 07/13/2017  . Anxiety   . Aortic insufficiency   . Aortic stenosis, moderate 07/13/2017  . Arthritis    back   . Carotid stenosis    Right carotid stent (widely patent) 40 - 59% left plaque 11/13  . Depression   . Dyslipidemia   . GERD (gastroesophageal reflux disease)   . Heart murmur   . Hemiplegia affecting unspecified side, late effect of cerebrovascular disease    resolved- from L side   . Hypertension   . Jaundice    resolved following ERCP & Cholecystectomy  . Mitral valve insufficiency and aortic valve insufficiency   . Pre-diabetes    per spouse  . Sleep apnea    does not wear CPAP  . Sleep concern    resulted in surgery- after + sleep test. Pt. doesn't have a problem any longer.   [name]  Stroke ([place]) 03/11/2003   stent placed on the 31, 3, 2004, L side   . Wears glasses   . Wears hearing aid in both ears   . Wears partial dentures         Past Surgical History:    Procedure Laterality Date  . BACK SURGERY     lumbar back  . CHOLECYSTECTOMY    . ERCP N/A 05/31/2013   Procedure: ENDOSCOPIC RETROGRADE CHOLANGIOPANCREATOGRAPHY (ERCP);  Surgeon: Ladene Artist, MD;  Location: [facility];  Service: Endoscopy;  Laterality: N/A;  . FOOT SURGERY     right  . LAPAROSCOPIC CHOLECYSTECTOMY SINGLE PORT N/A 06/01/2013   Procedure: LAPAROSCOPIC CHOLECYSTECTOMY SINGLE PORT;  Surgeon: Adin Hector, MD;  Location: [facility];  Service: General;  Laterality: N/A;  . POLYPECTOMY    . SHOULDER ARTHROSCOPY WITH ROTATOR CUFF REPAIR AND SUBACROMIAL DECOMPRESSION Left 05/18/2017  . SHOULDER ARTHROSCOPY WITH ROTATOR CUFF REPAIR AND SUBACROMIAL DECOMPRESSION Left 05/18/2017   Procedure: SHOULDER ARTHROSCOPY WITH ROTATOR CUFF REPAIR AND SUBACROMIAL DECOMPRESSION;  Surgeon: Tania Ade, MD;  Location: [facility];  Service: Orthopedics;  Laterality: Left;  LEFT SHOULDER ARTHROSCOPY WITH ROTATOR CUFF REPAIR AND SUBACROMIAL DECOMPRESSION  . TEE WITHOUT CARDIOVERSION N/A 05/09/2017   Procedure: TRANSESOPHAGEAL ECHOCARDIOGRAM (TEE);  Surgeon: Skeet Latch, MD;  Location: [facility] END[facility];  Service: Cardiovascular;  Laterality: N/A;  . TONSILLECTOMY          Family History  Problem Relation Age of Onset  . Stroke Father        No details  . Angina Mother    Social History:  reports that he has been smoking Cigarettes.  He has a 28.50 pack-year smoking history. He has never used smokeless tobacco. He reports that he drinks about 1.2 - 1.8 oz of alcohol per week . He reports that he does not use drugs. Allergies:      Allergies  Allergen Reactions  . Bee Venom Anaphylaxis and Swelling         Medications Prior to Admission  Medication Sig Dispense Refill  . aspirin EC 81 MG tablet Take 81 mg by mouth daily.      [name] aspirin-sod bicarb-citric acid (ALKA-SELTZER) 325 MG TBEF tablet Take 650 mg by mouth daily as needed (indigestion).    [name] buPROPion  (WELLBUTRIN SR) 150 MG 12 hr tablet Take 150 mg by mouth 2 (two) times daily.     . calcium carbonate (TUMS - DOSED IN MG ELEMENTAL CALCIUM) 500 MG chewable tablet Chew 2 tablets by mouth daily as needed for indigestion or heartburn.    . citalopram (CELEXA) 20 MG tablet Take 20 mg by mouth daily before breakfast.     . EPIPEN 2-PAK 0.3 MG/0.3ML SOAJ injection Inject 0.3 mg into the muscle once as needed (allergic reaction).     [name] lisinopril (PRINIVIL,ZESTRIL) 20 MG tablet Take 20 mg by mouth at bedtime.     . lovastatin (MEVACOR) 20 MG tablet Take 1 tablet (20 mg total) by mouth at bedtime. 90 tablet 2  . Multiple Vitamins-Minerals (ONE-A-DAY MENS 50+ ADVANTAGE) TABS Take 1 tablet by mouth daily with breakfast.    . omeprazole (PRILOSEC) 20 MG capsule Take 20 mg by mouth at bedtime.     [name] OVER THE COUNTER MEDICATION Hyland's Restful Legs tablets (AM/PM formulations): Take 1 tablet by mouth two times a day as needed for restless legs    . Pseudoeph-Doxylamine-DM-APAP (NYQUIL PO) Take 1 Dose by mouth at bedtime as needed (cough).    [name]  docusate sodium (COLACE) 100 MG capsule Take 1 capsule (100 mg total) by mouth 3 (three) times daily as needed. (Patient not taking: Reported on 07/13/2017) 20 capsule 0  . oxyCODONE-acetaminophen (ROXICET) 5-325 MG tablet Take 1-2 tablets by mouth every 4 (four) hours as needed for severe pain. (Patient not taking: Reported on 07/13/2017) 40 tablet 0    Home: [place] expects to be discharged to:: Private residence Living Arrangements: Spouse/significant other Available Help at Discharge: Family, Friend(s), Available 24 hours/day Type of Home: House Home Access: Stairs to enter [employer] of Steps: 1 + 1 Entrance Stairs-Rails: None Home Layout: One level Bathroom Shower/Tub: Tub/shower unit, Architectural technologist: Standard Bathroom Accessibility: Yes Home Equipment: None  Functional History: Prior  Function Level of Independence: Independent Comments: Pt was fully independent with ADLs including yard work.  He recently underwent Lt partial repair RTC,per wife (June 2018), and was still undergoing therapy - Dr Tamera Punt  Functional Status:  Mobility: Bed Mobility Overal bed mobility: Needs Assistance Bed Mobility: Supine to Sit Supine to sit: Max assist, +2 for physical assistance, Mod assist General bed mobility comments: Pt needed assist to move LEs off bed and for elevation of trunk. Used pad to scoot pt to EOB.  Takes a few moments for pt to attain upright sitting needing max assist inittially to maintain sitting and progressing to min to min guard assist.  Transfers Overall transfer level: Needs assistance Equipment used:  Charlaine Dalton) Transfer via Lift Equipment: Stedy Transfers: Sit to/from Stand Sit to Stand: Max assist, +2 physical assistance, From elevated surface, Mod assist  Lateral/Scoot Transfers: Mod assist, +2 physical assistance, Max assist General transfer comment: Pt needed mod to max assist to power up and pulled up on Stedy.  took incr time to come to full stand needing constant cuing but did achieve standing for 1 min while nurse cleaning pt as he has BM smear.  Pt needed a sitting rest break on the [place].  VSS.  Moved STedy to recliner and pt stood  once more with pt standing  fully upright 15 seconds and then pt with controlled descent into recliner.      ADL: ADL Overall ADL's : Needs assistance/impaired Eating/Feeding: NPO Grooming: Wash/dry hands, Oral care, Wash/dry face, Brushing hair, Maximal assistance, Sitting Upper Body Bathing: Maximal assistance, Sitting Lower Body Bathing: Total assistance, Bed level Upper Body Dressing : Total assistance, Sitting Lower Body Dressing: Total assistance, Bed level Toilet Transfer: Maximal assistance, +2 for physical assistance, Stand-pivot (stedy ) Toileting- Clothing Manipulation and Hygiene: Sit to/from stand, Total  assistance Toileting - Clothing Manipulation Details (indicate cue type and reason): Pt incontinent of stool, and was assisted with peri care  Functional mobility during ADLs: Maximal assistance, +2 for physical assistance (stedy )  Cognition: Cognition Overall Cognitive Status: Impaired/Different from baseline Orientation Level: Oriented to person Cognition Arousal/Alertness: Awake/alert Behavior During Therapy: Flat affect Overall Cognitive Status: Impaired/Different from baseline Area of Impairment: Orientation, Following commands, Safety/judgement, Awareness, Problem solving, Memory Orientation Level: Disoriented to, Time, Situation, Place Memory: Decreased short-term memory Following Commands: Follows one step commands with increased time Safety/Judgement: Decreased awareness of safety, Decreased awareness of deficits Awareness: Intellectual Problem Solving: Slow processing, Decreased initiation, Difficulty sequencing, Requires verbal cues, Requires tactile cues  Blood pressure 130/70, pulse 80, temperature 97.7 F (36.5 C), resp. rate 19, height 6\' 2"  (1.88 m), weight 105.8 kg (233 lb 4 oz), SpO2 97 %. Physical Exam  Vitals reviewed. Constitutional:  71 year old right handed ill appearing  male  HENT:  Nasogastric tube in place  Eyes:  Pupils reactive to light  Neck: Normal range of motion. Neck supple. No thyromegaly present.  Cardiovascular:  Cardiac rate control  Respiratory:  Decreased breath sounds at the bases  GI: Soft. Bowel sounds are normal. He exhibits no distension.  Neurological: He is alert.  Patient is somewhat anxious. He is hard of hearing. Voices very hoarse. He was able to provide his name only. Follows some simple commands  Skin: Skin is warm and dry.  Motor strength is 3 minus in the right deltoid, bicep, tricep, 2 minus at the finger flexors and extensors. Left upper extremity has at least 3/5 strength in the deltoid, bicep, tricep, finger flexors  and extensors, resistance testing not performed secondary to recent rotator cuff surgery. Lower extremity strength is 3 minus at the hip flexors, knee extensors, 2 minus at the ankle dorsiflexors. Sensation difficult to assess secondary to his communication he does with yes no responses indicate that he has equal sensation to light touch and pinch in both upper and lower extremities. Mild right facial droop.     Assessment/Plan: Diagnosis: Debility with more recent onset right upper extremity weakness, CT negative for infarct, however, may have brainstem infarct, other differential includes critical illness neuropathy/myopathy, given that he has bilateral lower extremity weakness as well 1. Does the need for close, 24 hr/day medical supervision in concert with the patient's rehab needs make it unreasonable for this patient to be served in a less intensive setting? Yes 2. Co-Morbidities requiring supervision/potential complications: History of prior CVA, hypertension, aortic stenosis, left rotator cuff repair 05/18/2017, history A. fib, RVR 3. Due to bladder management, bowel management, safety, skin/wound care, disease management, medication administration, pain management and patient education, does the patient require 24 hr/day rehab nursing? Yes 4. Does the patient require coordinated care of a physician, rehab nurse, PT (1-2 hrs/day, 5 days/week), OT (1-2 hrs/day, 5 days/week) and SLP (.5-1 hrs/day, 5 days/week) to address physical and functional deficits in the context of the above medical diagnosis(es)? Yes Addressing deficits in the following areas: balance, endurance, locomotion, strength, transferring, bowel/bladder control, bathing, dressing, feeding, grooming, toileting, cognition, speech, language, swallowing and psychosocial support 5. Can the patient actively participate in an intensive therapy program of at least 3 hrs of therapy per day at least 5 days per week? Yes 6. The potential for  patient to make measurable gains while on inpatient rehab is fair 7. Anticipated functional outcomes upon discharge from inpatient rehab are min assist and mod assist  with PT, min assist and mod assist with OT, min assist and mod assist with SLP. 8. Estimated rehab length of stay to reach the above functional goals is: 21 days to 25d 9. Anticipated D/C setting: Home versus SNF 10. Anticipated post D/C treatments: [place] therapy 11. Overall Rehab/Functional Prognosis: fair  RECOMMENDATIONS: This patient's condition is appropriate for continued rehabilitative care in the following setting: CIR Patient has agreed to participate in recommended program. Potentially Note that insurance prior authorization may be required for reimbursement for recommended care.  Comment: Needs further workup and neurology consult for etiology of weakness   ANGIULLI,DANIEL J., PA-C 08/02/2017    Revision History                        Routing History

## 2017-08-07 NOTE — Progress Notes (Addendum)
Inpatient Rehabilitation  I have received insurance authorization for IP Rehab.  I have notified medical team and await word back regarding medical readiness.  Please call with questions.   Update: I have received medical clearance and have a bed to offer patient today.  Will proceed with admission to CIR.   Carmelia Roller., CCC/SLP Admission Coordinator  [place]  Cell [phone]

## 2017-08-07 NOTE — Progress Notes (Signed)
Patient received at 1600 via low bed alert and oriented x 2-3. Patient and wife oriented to room and call bell system. Patient and wife verbalized understanding of admission process. cortrak intact with TF Jevity at 65 cc/hour.Continue with plan of care. Brandon [name]

## 2017-08-07 NOTE — PMR Pre-admission (Signed)
PMR Admission Coordinator Pre-Admission Assessment  Patient: Brandon [name] is an 71 y.o., male MRN: [id number] DOB: 1946/01/28 Height: 6\' 2"  (188 cm) Weight: 101.9 kg (224 lb 9.6 oz)              Insurance Information HMO: X    PPO:      PCP:      IPA:      80/20:      OTHER:  PRIMARY: [employer]       Policy#: [id number]      Subscriber: Self CM Name: [name]       Phone#: [phone]     Fax#: [phone] Pre-Cert#: [id number] given by Wyona Almas Phone # [phone] with request for weekly updates to be provided to CM     Employer: Retired  Benefits:  Phone #: [phone]     Name: Sonia Baller, Reference #[id number] Eff. Date: 12/12/16     Deduct: $0      Out of Pocket Max: $5900      Life Max: N/A CIR: $295 a day, days 1-6; $0 a day, days 7+      SNF: $0 a day, days 1-20; $167 a day, days 21-100 Outpatient: PT/OT/SLP     Co-Pay: $10 per visit  Home Health: PT/OT/SLP 100%      Co-Pay: none DME: 80%     Co-Pay: 20% Providers: In-network   SECONDARY: [employer]      Policy#: [id number]      Subscriber: Self CM Name:       Phone#:      Fax#:  Pre-Cert#:       Employer: Retired  Benefits:  Phone #: [phone]     Name:  Eff. Date:      Deduct:       Out of Pocket Max:       Life Max:  CIR:       SNF:  Outpatient:      Co-Pay:  Home Health:       Co-Pay:  DME:      Co-Pay:   Medicaid Application Date:       Case Manager:  Disability Application Date:       Case Worker:   Emergency Contact Information Contact Information    Name Relation Home Work Mobile   [place] Spouse [phone]  [phone]     Current Medical History  Patient Admitting Diagnosis: Debility  History of Present Illness:  Brandon [name] a 71 y.o.right handed malewith history of CVA with carotid stenting, hypertension, aortic stenosis, tobacco abuse, [name], and recent rotator cuff surgery 05/18/2017. Presented 07/13/2017 for nonspecific chest pain,  diaphoresis elevated troponin 0.12. Patient lives with spouse reported to be independent prior to admission. One level home with 1 step to entry. Chest x-ray with interval development of pulmonary edema. Treated with intravenous Heparin, Lasix, Lopressor added for bouts of atrial fibrillation with RVR/troponin 0.12 and heparin was transitioned to eliquis. He did develop some agitation received Xanax as well as Haldol. Echocardiogram with ejection fraction of 76% grade 2 diastolic dysfunction. Patient did require intubation for acute hypoxic respiratory failure 07/14/2017. TEE showed moderate LVH, moderate AI with moderate to severe mitral regurgitation. Venous Dopplers lower extremities negative for DVT. He was extubated 07/25/2017 with course complicated by suspect pneumonia. Antibiotics completed 07/29/2017 as well as completing course of prednisone. Ongoing monitoring of mental status/Delirium with cranial CT/MRI scan 08/04/2017 showed no acute intracranial abnormalities with old remote right MCA territory infarct.  Palliative care was consulted to establish goals of care.A Cortrak fedding tube was placed 07/26/2017 for nutritional support and currently remains  NPO.Objective swallow study scheduled for 08/07/17 at 1330. Physical therapy evaluation completed and noting ongoing debilitation. The clarification of weightbearing status of left upper extremity after rotator cuff surgery is nonweightbearing left upper extremity except help with standing and using a rolling walker. No pulling or pushing with left upper extremity. Acute on chronic anemia 7.4 and monitored with plan for transfusion 1 unit 08/07/2017. M.D. has requested physical medicine rehabilitation consult. Patient was admitted for comprehensive rehabilitation program 08/07/17.       Past Medical History  Past Medical History:  Diagnosis Date  . Anemia 07/13/2017  . Anxiety   . Aortic insufficiency   . Aortic stenosis, moderate 07/13/2017  .  Arthritis    back   . Carotid stenosis    Right carotid stent (widely patent) 40 - 59% left plaque 11/13  . Depression   . Dyslipidemia   . GERD (gastroesophageal reflux disease)   . Heart murmur   . Hemiplegia affecting unspecified side, late effect of cerebrovascular disease    resolved- from L side   . Hypertension   . Jaundice    resolved following ERCP & Cholecystectomy  . Mitral valve insufficiency and aortic valve insufficiency   . Pre-diabetes    per spouse  . Sleep apnea    does not wear CPAP  . Sleep concern    resulted in surgery- after + sleep test. Pt. doesn't have a problem any longer.   . Stroke ([place]) 03/11/2003   stent placed on the 31, 3, 2004, L side   . Wears glasses   . Wears hearing aid in both ears   . Wears partial dentures     Family History  family history includes Angina in his mother; Stroke in his father.  Prior Rehab/Hospitalizations:  Has the patient had major surgery during 100 days prior to admission? Yes  Current Medications   Current Facility-Administered Medications:  .  amiodarone (PACERONE) tablet 200 mg, 200 mg, Per Tube, BID, Bensimhon, Shaune Pascal, MD, 200 mg at 08/07/17 1056 .  apixaban (ELIQUIS) tablet 5 mg, 5 mg, Per Tube, BID, Lyndee Leo, RPH, 5 mg at 08/07/17 1056 .  chlorhexidine (PERIDEX) 0.12 % solution 15 mL, 15 mL, Mouth Rinse, BID, Larey Dresser, MD, 15 mL at 08/07/17 1057 .  Chlorhexidine Gluconate Cloth 2 % PADS 6 each, 6 each, Topical, Daily, Larey Dresser, MD, 6 each at 08/07/17 1000 .  feeding supplement (JEVITY 1.2 CAL) liquid 1,000 mL, 1,000 mL, Per Tube, Continuous, Larey Dresser, MD, Last Rate: 65 mL/hr at 08/07/17 0458, 1,000 mL at 08/07/17 0458 .  feeding supplement (PRO-STAT SUGAR FREE 64) liquid 30 mL, 30 mL, Per Tube, TID, Larey Dresser, MD, 30 mL at 08/07/17 1056 .  fentaNYL (SUBLIMAZE) injection 25-100 mcg, 25-100 mcg, Intravenous, T0W PRN, Pershing Proud, NP, 409 mcg at 08/06/17 2028 .  free  water 200 mL, 200 mL, Per Tube, Q6H, Bensimhon, Shaune Pascal, MD, 200 mL at 08/07/17 1200 .  furosemide (LASIX) tablet 40 mg, 40 mg, Per Tube, Daily, Larey Dresser, MD, 40 mg at 08/07/17 1056 .  glycopyrrolate (ROBINUL) injection 0.2 mg, 0.2 mg, Intravenous, B3Z PRN, Pershing Proud, NP, 0.2 mg at 08/03/17 2142 .  insulin aspart (novoLOG) injection 0-20 Units, 0-20 Units, Subcutaneous, Q4H, Collene Gobble, MD, 4 Units at 08/06/17 2022 .  MEDLINE mouth rinse, 15 mL, Mouth Rinse, q12n4p, Larey Dresser, MD, 15 mL at 08/07/17 1200 .  metoprolol tartrate (LOPRESSOR) injection 2.5 mg, 2.5 mg, Intravenous, Q2H PRN, Barrett, Rhonda G, PA-C, 2.5 mg at 07/23/17 0853 .  midazolam (VERSED) bolus via infusion 0.5 mg, 0.5 mg, Intravenous, Q4H PRN, Amie Portland, MD .  nitroGLYCERIN (NITROSTAT) SL tablet 0.4 mg, 0.4 mg, Sublingual, Q5 Min x 3 PRN, Barrett, Rhonda G, PA-C .  ondansetron (ZOFRAN) injection 4 mg, 4 mg, Intravenous, Q6H PRN, Barrett, Rhonda G, PA-C, 4 mg at 07/20/17 1158 .  pantoprazole sodium (PROTONIX) 40 mg/20 mL oral suspension 40 mg, 40 mg, Per Tube, Daily, Shirley Friar, PA-C, 40 mg at 08/07/17 1000 .  potassium chloride 20 MEQ/15ML (10%) solution 40 mEq, 40 mEq, Oral, Daily, Bensimhon, Shaune Pascal, MD, 40 mEq at 08/07/17 1056 .  sodium chloride flush (NS) 0.9 % injection 10-40 mL, 10-40 mL, Intracatheter, PRN, Larey Dresser, MD  Patients Current Diet: Diet NPO time specified Except for: Ice Chips Diet - low sodium heart healthy  Precautions / Restrictions Precautions Precautions: Shoulder, Fall Type of Shoulder Precautions: see note  Precaution Comments: Per Dr Tamera Punt (pt s/p Lt RCR early June) NWB Lt UE except to assist with standing and able to use RW; No pulling or pushing with Lt UE;  unrestricted PROM/AROM is okay.  No strengthening   Restrictions Weight Bearing Restrictions: Yes LUE Weight Bearing: Non weight bearing   Has the patient had 2 or more falls or a fall  with injury in the past year?No  Prior Activity Level Community (5-7x/wk): Patient had been getting wobbly for about two weeks priro to admission.  He was fully independent, driving, mowing, and meeting the guys for breakfast on Tuesdays.    Home Assistive Devices / Equipment Home Assistive Devices/Equipment: None Home Equipment: None  Prior Device Use: Indicate devices/aids used by the patient prior to current illness, exacerbation or injury? None of the above, used a single point cane for two weeks leading up to admission.  Prior Functional Level Prior Function Level of Independence: Independent Comments: Pt was fully independent with ADLs including yard work.  He recently underwent Lt partial repair RTC,per wife (June 2018), and was still undergoing therapy - Dr Tamera Punt   Self Care: Did the patient need help bathing, dressing, using the toilet or eating? Independent  Indoor Mobility: Did the patient need assistance with walking from room to room (with or without device)? Independent  Stairs: Did the patient need assistance with internal or external stairs (with or without device)? Independent  Functional Cognition: Did the patient need help planning regular tasks such as shopping or remembering to take medications? Independent  Current Functional Level Cognition  Overall Cognitive Status: Impaired/Different from baseline Current Attention Level: Focused Orientation Level: Oriented to person, Oriented to place, Oriented to time, Disoriented to situation Following Commands: Follows one step commands consistently Safety/Judgement: Decreased awareness of safety, Decreased awareness of deficits General Comments: improvement in cognition today.    Extremity Assessment (includes Sensation/Coordination)  Upper Extremity Assessment: Generalized weakness, LUE deficits/detail, RUE deficits/detail RUE Deficits / Details: Rt UE grossly 3+/5 - 4-/5;  LUE Deficits / Details: Grossly 3-/5.   Pt with recent partial rotator cuff repair   Lower Extremity Assessment: Defer to PT evaluation RLE Deficits / Details: grossly 3-5 LLE Deficits / Details: grossly 2+/5    ADLs  Overall ADL's : Needs assistance/impaired Eating/Feeding: NPO Grooming: Wash/dry hands, Wash/dry face, Applying deodorant, Minimal assistance  Upper Body Bathing: Moderate assistance, Cueing for sequencing, Bed level Lower Body Bathing: Total assistance, Bed level Upper Body Dressing : Moderate assistance, Cueing for sequencing, Bed level Lower Body Dressing: Total assistance, Bed level Toilet Transfer: Maximal assistance, +2 for physical assistance, Stand-pivot (stedy ) Toileting- Clothing Manipulation and Hygiene: Sit to/from stand, Total assistance Toileting - Clothing Manipulation Details (indicate cue type and reason): Pt incontinent of stool, and was assisted with peri care  Functional mobility during ADLs: Maximal assistance, +2 for physical assistance (stedy ) General ADL Comments: Pt. was seen for ADL session. Pt. required mod cues to perform tasks. Pt. is HOH which may be impacting pt. ability to follow directions. Pt. was cooperative during session.     Mobility  Overal bed mobility: Needs Assistance Bed Mobility: Supine to Sit Rolling: Mod assist Supine to sit: Mod assist General bed mobility comments: Mod assist to raise trunk.  Able to maintain sitting balance.    Transfers  Overall transfer level: Needs assistance Equipment used: Rolling walker (2 wheeled) Transfer via Lift Equipment: Stedy Transfers: Sit to/from Stand, Risk manager Sit to Stand: Max assist, Mod assist, +2 physical assistance Stand pivot transfers: Mod assist, +2 physical assistance  Lateral/Scoot Transfers: Mod assist, +2 physical assistance, Max assist General transfer comment: Verbal cues for hand placement.  Patient required +2 mod assist to stand from elevated bed, and +2 max assist to stand from lower BSC.  Mod  assist to maintain balance in standing.  Patient able to take several shuffle steps with RW and +2 mod assist to transfer bed > BSC and BSC > recliner.    Ambulation / Gait / Stairs / Wheelchair Mobility  Ambulation/Gait General Gait Details: Unable    Posture / Balance Dynamic Sitting Balance Sitting balance - Comments: Min guard assist to maintain balance EOB.  Balance Overall balance assessment: Needs assistance, History of Falls Sitting-balance support: Feet supported, Bilateral upper extremity supported Sitting balance-Leahy Scale: Fair Sitting balance - Comments: Min guard assist to maintain balance EOB.  Postural control: Left lateral lean, Posterior lean Standing balance support: Bilateral upper extremity supported Standing balance-Leahy Scale: Poor Standing balance comment: heavy reliance on RW and physical assist to maintain static stand with RW.    Special needs/care consideration BiPAP/CPAP: No CPM: No Continuous Drip IV: no Dialysis: No         Life Vest: no Oxygen: None prior to admission; however, now on 3L nasal canula  Special Bed: Yes, high low bed  Trach Size: No Wound Vac (area): No       Skin: Skin team to left arm and elbow; Moisture associated skin damage to perineum and sacrum Bowel mgmt: Continent, last BM 08/03/17 Bladder mgmt: Incontinent with foley placed  Diabetic mgmt: Borderline per spouse report  Safety: Yes, tele-sitter in use      Previous Home Environment Living Arrangements: Spouse/significant other Available Help at Discharge: Family, Friend(s), Available 24 hours/day Type of Home: House Home Layout: One level Home Access: Stairs to enter Entrance Stairs-Rails: None Entrance Stairs-Number of Steps: 1 + 1 Bathroom Shower/Tub: Public librarian, Architectural technologist: Programmer, systems: Yes Home Care Services: No  Discharge Living Setting Plans for Discharge Living Setting: Patient's home, Lives with (comment) (Spouse,  Sandra Calvo ) Type of Home at Discharge: Other (Comment) (Double wide house) Discharge Home Layout: One level Discharge Home Access: Other (comment) (small step onto deck/porch then threshold into house ) Discharge Bathroom Shower/Tub: Tub/shower unit Discharge Bathroom Toilet: Standard Discharge Bathroom  Accessibility: Yes How Accessible: Accessible via walker Does the patient have any problems obtaining your medications?: No  Social/Family/Support Systems Patient Roles: Spouse Contact Information: Spouse: Duanne Guess  Anticipated Caregiver: Spouse Anticipated Caregiver's Contact Information: home: [phone], cell: [phone] Ability/Limitations of Caregiver: None Caregiver Availability: 24/7 Discharge Plan Discussed with Primary Caregiver: Yes Is Caregiver In Agreement with Plan?: Yes Does Caregiver/Family have Issues with Lodging/Transportation while Pt is in Rehab?: No  Goals/Additional Needs Patient/Family Goal for Rehab: PT/OT/SLP Min-Mod A Expected length of stay: 21-25 days  Cultural Considerations: [employer]  Dietary Needs: Currently NPO with Cortrak feeding tube; however MBS scheduled for 1330 today  Equipment Needs: TBD Special Service Needs: None Pt/Family Agrees to Admission and willing to participate: Yes Program Orientation Provided & Reviewed with Pt/Caregiver Including Roles  & Responsibilities: Yes Additional Information Needs: Left shoulder surgery in June with Dr. Tamera Punt  Information Needs to be Provided By: Team FYI  Decrease burden of Care through IP rehab admission: No  Possible need for SNF placement upon discharge: Yes  Patient Condition: This patient's medical and functional status has changed since the consult dated: 08/02/17 in which the Rehabilitation Physician determined and documented that the patient's condition is appropriate for intensive rehabilitative care in an inpatient rehabilitation facility. See "History of Present  Illness" (above) for medical update. Functional changes are: Mod-Max A +2 for transfers. Patient's medical and functional status update has been discussed with the Rehabilitation physician and patient remains appropriate for inpatient rehabilitation. Will admit to inpatient rehab today.  Preadmission Screen Completed By:  Gunnar Fusi, 08/07/2017 11:59 AM ______________________________________________________________________   Discussed status with Dr. Posey Pronto on 08/07/17 at 1220 and received telephone approval for admission today.  Admission Coordinator:  Gunnar Fusi, time 1220/Date 08/07/17

## 2017-08-07 NOTE — Progress Notes (Signed)
Patient ID: Brandon [name], male   DOB: 08-21-1946, 71 y.o.   MRN: [id number]     Advanced Heart Failure Rounding Note   Subjective:    8/3 developed progressive respiratory distress => Bipap => intubation.  With sedation, became hypotensive and bradycardic, dopamine + phenylephrine started.  Developed AKI. Hyperkalemia treated with HCO3 gtt and Kayexalate.  Also with metabolic acidosis with elevated lactate (HCO3 gtt begun).  LFTs rose to the 1000s range.  Extubated 07/25/17.  Cor-Track placed 07/26/17, getting tube feeds.   CT head (8/23) with old stroke on right, nothing acute.   More alert this am. Answers questions appropriately. Denies SOB. CVP 1.    Antimicrobials this admission: 8/9 Cefepime >>8/12 8/10 Vancomycin >>8/12 8/12 Cipro >> 8/18  Microbiology results: 8/2 MRSA: negative 8/3 BCx: NGTD 8/8 TA: few S. Aureus, few Enterobacter - both S to cipro  Studies:  ECHO (5/18): EF 55-60%, moderate AS, moderate AI, moderate MR.   ECHO (8/18): EF 55-60%, moderate LVH, moderate AS mean 33, moderate AI, moderate-severe MR  Objective:   Weight Range: 224 lb 9.6 oz (101.9 kg) Body mass index is 28.84 kg/m.   Vital Signs:   Temp:  [97.7 F (36.5 C)-99 F (37.2 C)] 97.8 F (36.6 C) (08/27 0420) Pulse Rate:  [67-77] 73 (08/27 0420) Resp:  [13-18] 18 (08/27 0420) BP: (126-176)/(28-72) 154/55 (08/27 0420) SpO2:  [95 %-99 %] 97 % (08/27 0420) Weight:  [224 lb 9.6 oz (101.9 kg)] 224 lb 9.6 oz (101.9 kg) (08/27 0426) Last BM Date: 08/03/17  Weight change: Filed Weights   08/05/17 0349 08/06/17 0353 08/07/17 0426  Weight: 230 lb (104.3 kg) 227 lb 6.4 oz (103.1 kg) 224 lb 9.6 oz (101.9 kg)   Intake/Output:   Intake/Output Summary (Last 24 hours) at 08/07/17 0736 Last data filed at 08/07/17 0300  Gross per 24 hour  Intake          1192.25 ml  Output             1925 ml  Net          -732.75 ml    Physical Exam   CVP 1 General: Fatigued appearing male, NAD.    HEENT:poor dentition. Coretrak in place.  Neck: supple. No JVD. Carotids 2+ bilat; no bruits. No lymphadenopathy or thryomegaly appreciated. Cor: PMI nondisplaced. Regular rate and rhythm. 2/6 AS murmur.   Lungs: Diminished in bases, rhonchi in upper lobes.  Abdomen: Obese, soft, non tender, non distended. . No hepatosplenomegaly. No bruits or masses. Good bowel sounds. Extremities: no cyanosis, clubbing, rash, No peripheral edema.  Neuro: alert & orientedx3, cranial nerves grossly intact. moves all 4 extremities w/o difficulty. Affect pleasant    Telemetry  NSR 60-70's - personally reviewed.   Labs    CBC  Recent Labs  08/06/17 0345 08/07/17 0506  WBC 13.6* 13.2*  NEUTROABS 11.8* 10.2*  HGB 7.7* 7.4*  HCT 25.3* 24.8*  MCV 100.4* 100.4*  PLT 331 371   Basic Metabolic Panel  Recent Labs  08/06/17 0345 08/07/17 0506  NA 142 139  K 4.3 4.0  CL 110 106  CO2 26 26  GLUCOSE 145* 130*  BUN 43* 49*  CREATININE 1.10 1.04  CALCIUM 8.6* 8.5*  MG 2.2 2.3   Liver Function Tests  Recent Labs  08/05/17 0500 08/06/17 0345  AST 52* 45*  ALT 54 54  ALKPHOS 47 48  BILITOT 0.8 1.0  PROT 5.8* 5.9*  ALBUMIN 2.3* 2.4*  No results for input(s): LIPASE, AMYLASE in the last 72 hours. Cardiac Enzymes No results for input(s): CKTOTAL, CKMB, CKMBINDEX, TROPONINI in the last 72 hours.  BNP: BNP (last 3 results)  Recent Labs  07/13/17 1341  BNP 408.4*    ProBNP (last 3 results) No results for input(s): PROBNP in the last 8760 hours.   D-Dimer No results for input(s): DDIMER in the last 72 hours. Hemoglobin A1C No results for input(s): HGBA1C in the last 72 hours. Fasting Lipid Panel No results for input(s): CHOL, HDL, LDLCALC, TRIG, CHOLHDL, LDLDIRECT in the last 72 hours. Thyroid Function Tests  Recent Labs  08/04/17 2311  TSH 3.239    Other results:   Imaging   Transthoracic Echocardiography 07/14/17 Study Conclusions  - Left ventricle: The cavity  size was moderately dilated. There was   moderate concentric hypertrophy. Systolic function was normal.   The estimated ejection fraction was in the range of 55% to 60%.   There is akinesis of the basalinferior myocardium. Features are   consistent with a pseudonormal left ventricular filling pattern,   with concomitant abnormal relaxation and increased filling   pressure (grade 2 diastolic dysfunction). Doppler parameters are   consistent with high ventricular filling pressure. - Aortic valve: Cusp separation was reduced. The peak aortic   velocity is elevated at 4.27m/sec but this is most likely high   due to increased flow across the AV from moderate AR. The mean   gradient of 76mmHg and VTI ratio of the LVOT/AV is most   consistent with moderate stenosis. There was moderate   regurgitation. Mean gradient (S): 33 mm Hg. VTI ratio of LVOT to   aortic valve: 0.27. Valve area (VTI): 0.85 cm^2. Valve area   (Vmax): 0.9 cm^2. Valve area (Vmean): 1.11 cm^2. Regurgitation   pressure half-time: 379 ms. - Mitral valve: Calcified annulus. Visually there appears to be   moderate to severe regurgitation. The PISA calculation does   appear accurate (mild MR by PISA). Valve area by pressure   half-time: 2.22 cm^2. Valve area by continuity equation (using   LVOT flow): 1.91 cm^2. - Left atrium: The atrium was moderately dilated. - Pulmonic valve: There was trivial regurgitation. - Pulmonary arteries: Systolic pressure could not be accurately   estimated.  Impressions:  - Compared to echo 04/2017, the mean AV gradient and peak AV   velocity are essentially unchanged.  Recommendations:  Consider TEE to evaluate MR further. Visually appears to be at least moderate to severe MR.    Medications:     Scheduled Medications: . amiodarone  200 mg Per Tube BID  . apixaban  5 mg Per Tube BID  . chlorhexidine  15 mL Mouth Rinse BID  . Chlorhexidine Gluconate Cloth  6 each Topical Daily  .  feeding supplement (PRO-STAT SUGAR FREE 64)  30 mL Per Tube TID  . free water  200 mL Per Tube Q6H  . furosemide  40 mg Per Tube Daily  . insulin aspart  0-20 Units Subcutaneous Q4H  . mouth rinse  15 mL Mouth Rinse q12n4p  . pantoprazole sodium  40 mg Per Tube Daily  . potassium chloride  40 mEq Oral Daily  . predniSONE  40 mg Oral Q breakfast    Infusions: . feeding supplement (JEVITY 1.2 CAL) 1,000 mL (08/07/17 0458)    PRN Medications: fentaNYL (SUBLIMAZE) injection, glycopyrrolate, metoprolol tartrate, midazolam, nitroGLYCERIN, ondansetron (ZOFRAN) IV, sodium chloride flush    Patient Profile   71 yo with  valvular heart disease and smoking/suspected COPD, CVA with carotid stenting was admitted with dyspnea/respiratory distress.   Assessment/Plan   1. Acute hypoxemic respiratory failure: Probable mix of pulmonary edema, PNA and COPD flare.  Extubated 07/25/17.  CVP 5. PCT not elevated, afebrile.  He had a course of prednisone and has completed abx.  - Volume stable.  - CVP 1 - Continue 40 mg lasix daily.  - Abx stopped 07/29/17. (10 day course  for suspected PNA => trach aspirate with Enterobacter and Staph.  ) 2. Acute on chronic diastolic CHF: Baseline has at least moderate valvular disease (moderate AS, moderate AI, moderate-severe MR).  Suspect diastolic CHF triggered by poorly tolerated paroxysmal atrial fibrillation in setting of valvular disease.  Admission thought to be triggered by diastolic CHF/pulmonary edema.   He developed AKI with lactic acidosis in the setting of hypotension with sedation.  - CVP 1.  - Continues to work with PT. Recommend CIR.  - Per Dr. Aundra Dubin he will need eventual RHC/LHC but will hold off for now given altered mental status and severe debilitation.  Will try to get to CIR, then arrange for TEE to evaluate valves+ RHC/LHC when he is more recovered.   3. Shock: Probably mixed cardiogenic/vasodilatory.  Developed in the setting of sedation with  Bipap then intubation as well as bradycardia with sedation.  Resolved, has been off pressors.  - No change.  4. AKI: With hyperkalemia (treated) in setting of shock. Suspect ATN.   - Resolved, creatinine 1.04 5. Elevated LFTs: Improved, likely shock liver.  - Resolved.  6. Atrial fibrillation: Paroxysmal. Remains in NSR with frequent PACs on po amio now. - Continue Eliquis.  7. Anemia:  FOBT negative 8/7. Transferrin saturation was 3% on 8/5. 1u RBCs on 8/17, had feraheme 8/21. No overt bleeding but Hgb 7.4 today.  - Will give 1 unit PRBCs today.   - He will need GI workup eventually.  8. ID: Blx CX NGTD. Suspected PNA.  On vancomycin/cefepime initially, then ciprofloxacin.  Stopped 07/30/17 after 10 day course. Wbc down.  PCT was not elevated 8/21. - no change.  9. COPD: Suspected.  Active smoker.  Was on prednisone to cover exacerbation.  - Stable, no wheeze.  10. Valvular heart disease: Echo this admission with moderate AS, moderate AI, moderate to severe MR.  May eventually need consideration of surgical valve repair/replacement with Maze given poorly tolerated afib.  - Eventually should have TEE, but would like to see mental status improved. It is possible that this presentation is driven by valvular disease + atrial fibrillation triggering CHF.  He will need a lot of rehab before he is strong enough for heart surgery. As above, likely would get to CIR then when he has recovered arrange for TEE + LHC/RHC.  - No change.  11. Hypernatremia: Na 139. Continue free water as he cannot take po.     12. Delirium: Ongoing. Follows commands, moves all extremities but disoriented.  Head CT 8/23 with old right-sided CVA (remote), no acute changes.    -  Improved.  78.  H/o CVA: Has history of right MCA territory CVA.  -No change.  14. Now DNR/limited code - Successfully extubated 07/25/17.   - Family would not want CPR or intubation. Pressors ok  - No change.  15. Dysphagia:  - Seen by speech  yesterday remains NPO.  Continue tube feeds.  - Will re evaluate today.  16. Deconditioning: Marked.  Needs much PT.   - Seen  by inpatient rehab.  17. R wrist gout - Improved after prednisone => last dose today.   Length of Stay: Sandy Level, NP  08/07/2017, 7:36 AM  Advanced Heart Failure Team Pager [phone] (M-F; 7a - 4p)  Please contact [facility] for night-coverage after hours (4p -7a ) and weekends on [email or web address]  Patient seen with NP, agree with the above note.   He will go to CIR today.  Slow fall in hemoglobin without overt bleeding, will give 1 unit prior to CIR.  Will need eventual GI evaluation.   Stop prednisone for gout after today's dose.   Volume ok, continue current Lasix.    Remains in NSR on amiodarone.  Continue 200 mg bid x 1 more week then decrease to 200 mg daily.   If he has good functional recovery, will need TEE + LHC/RHC down the road.   Loralie Champagne 08/07/2017 4:47 PM

## 2017-08-07 NOTE — H&P (Signed)
Physical Medicine and Rehabilitation Admission H&P    Chief Complaint  Patient presents with  . Shortness of Breath  : HPI: Brandon [name] is a 71 y.o. right handed male with history of CVA with carotid stenting, hypertension, aortic stenosis, tobacco abuse, [name], Chronic back pain and recent rotator cuff surgery 05/18/2017.  Presented 07/13/2017 for nonspecific chest pain, diaphoresis elevated troponin 0.12. History taken from chart review and wife. Patient lives with spouse reported to be independent prior to admission. One level home with 1 step to entry. Chest x-ray with interval development of pulmonary edema. Treated with intravenous Heparin, Lasix , Lopressor added for bouts of atrial fibrillation with RVR/troponin 0.12 and heparin was transitioned to eliquis.[name] He did develop some agitation received Xanax as well as Haldol. Echocardiogram with ejection fraction of 25% grade 2 diastolic dysfunction. Patient did require intubation for acute hypoxic respiratory failure 07/14/2017. TEE showed moderate LVH, moderate AI with moderate to severe mitral regurgitation. Venous Dopplers lower extremities negative for DVT. He was extubated 07/25/2017 with course complicated by suspect pneumonia. Antibiotics completed 07/29/2017 as well as completing course of prednisone.. Ongoing monitoring of mental status/Delirium with cranial CT/MRI scan 08/04/2017 showed no acute intracranial abnormalities with old remote right MCA territory infarct. Palliative care was consulted to establish goals of care. A Cor-Track tube was placed 07/26/2017 for nutritional support andModified barium swallow completed 08/07/2017 placed on a dysphagia #2 nectar thick liquid diet . Physical therapy evaluation completed and ongoing noting debilitation. The clarification of weightbearing status of left upper extremity after rotator cuff surgery Is nonweightbearing left upper extremity except help with standing and using a  rolling walker. No pulling or pushing with left upper extremity. Acute on chronic anemia 7.4 and monitored with plan transfusion 1 unit 08/07/2017. Patient passed swallow and advanced to D2 diet.  Pain meds changed from IV fentanyl, which he had been receiving ~5/day. M.D. has requested physical medicine rehabilitation consult. Patient was admitted for comprehensive rehabilitation program  Review of Systems  Constitutional: Negative for chills and fever.  HENT: Negative for hearing loss.   Eyes: Negative for blurred vision and double vision.  Respiratory: Positive for cough and shortness of breath.   Cardiovascular: Positive for chest pain and palpitations.  Gastrointestinal: Positive for constipation. Negative for nausea and vomiting.  Genitourinary: Positive for urgency.  Musculoskeletal: Positive for myalgias.  Skin: Negative for rash.  Neurological: Positive for weakness.  Psychiatric/Behavioral: Positive for depression. The patient has insomnia.   All other systems reviewed and are negative.  Past Medical History:  Diagnosis Date  . Anemia 07/13/2017  . Anxiety   . Aortic insufficiency   . Aortic stenosis, moderate 07/13/2017  . Arthritis    back   . Carotid stenosis    Right carotid stent (widely patent) 40 - 59% left plaque 11/13  . Depression   . Dyslipidemia   . GERD (gastroesophageal reflux disease)   . Heart murmur   . Hemiplegia affecting unspecified side, late effect of cerebrovascular disease    resolved- from L side   . Hypertension   . Jaundice    resolved following ERCP & Cholecystectomy  . Mitral valve insufficiency and aortic valve insufficiency   . Pre-diabetes    per spouse  . Sleep apnea    does not wear CPAP  . Sleep concern    resulted in surgery- after + sleep test. Pt. doesn't have a problem any longer.   . Stroke ([place]) 03/11/2003  stent placed on the 31, 3, 2004, L side   . Wears glasses   . Wears hearing aid in both ears   . Wears partial  dentures    Past Surgical History:  Procedure Laterality Date  . BACK SURGERY     lumbar back  . CHOLECYSTECTOMY    . ERCP N/A 05/31/2013   Procedure: ENDOSCOPIC RETROGRADE CHOLANGIOPANCREATOGRAPHY (ERCP);  Surgeon: Ladene Artist, MD;  Location: [facility];  Service: Endoscopy;  Laterality: N/A;  . FOOT SURGERY     right  . LAPAROSCOPIC CHOLECYSTECTOMY SINGLE PORT N/A 06/01/2013   Procedure: LAPAROSCOPIC CHOLECYSTECTOMY SINGLE PORT;  Surgeon: Adin Hector, MD;  Location: [facility];  Service: General;  Laterality: N/A;  . POLYPECTOMY    . SHOULDER ARTHROSCOPY WITH ROTATOR CUFF REPAIR AND SUBACROMIAL DECOMPRESSION Left 05/18/2017  . SHOULDER ARTHROSCOPY WITH ROTATOR CUFF REPAIR AND SUBACROMIAL DECOMPRESSION Left 05/18/2017   Procedure: SHOULDER ARTHROSCOPY WITH ROTATOR CUFF REPAIR AND SUBACROMIAL DECOMPRESSION;  Surgeon: Tania Ade, MD;  Location: [facility];  Service: Orthopedics;  Laterality: Left;  LEFT SHOULDER ARTHROSCOPY WITH ROTATOR CUFF REPAIR AND SUBACROMIAL DECOMPRESSION  . TEE WITHOUT CARDIOVERSION N/A 05/09/2017   Procedure: TRANSESOPHAGEAL ECHOCARDIOGRAM (TEE);  Surgeon: Skeet Latch, MD;  Location: [facility];  Service: Cardiovascular;  Laterality: N/A;  . TONSILLECTOMY     Family History  Problem Relation Age of Onset  . Stroke Father        No details  . Angina Mother    Social History:  reports that he has been smoking Cigarettes.  He has a 28.50 pack-year smoking history. He has never used smokeless tobacco. He reports that he drinks about 1.2 - 1.8 oz of alcohol per week . He reports that he does not use drugs. Allergies:  Allergies  Allergen Reactions  . Bee Venom Anaphylaxis and Swelling   Medications Prior to Admission  Medication Sig Dispense Refill  . aspirin EC 81 MG tablet Take 81 mg by mouth daily.      [name] aspirin-sod bicarb-citric acid (ALKA-SELTZER) 325 MG TBEF tablet Take 650 mg by mouth daily as needed (indigestion).    [name] buPROPion (WELLBUTRIN  SR) 150 MG 12 hr tablet Take 150 mg by mouth 2 (two) times daily.     . calcium carbonate (TUMS - DOSED IN MG ELEMENTAL CALCIUM) 500 MG chewable tablet Chew 2 tablets by mouth daily as needed for indigestion or heartburn.    . citalopram (CELEXA) 20 MG tablet Take 20 mg by mouth daily before breakfast.     . EPIPEN 2-PAK 0.3 MG/0.3ML SOAJ injection Inject 0.3 mg into the muscle once as needed (allergic reaction).     [name] lisinopril (PRINIVIL,ZESTRIL) 20 MG tablet Take 20 mg by mouth at bedtime.     . lovastatin (MEVACOR) 20 MG tablet Take 1 tablet (20 mg total) by mouth at bedtime. 90 tablet 2  . Multiple Vitamins-Minerals (ONE-A-DAY MENS 50+ ADVANTAGE) TABS Take 1 tablet by mouth daily with breakfast.    . omeprazole (PRILOSEC) 20 MG capsule Take 20 mg by mouth at bedtime.     [name] OVER THE COUNTER MEDICATION Hyland's Restful Legs tablets (AM/PM formulations): Take 1 tablet by mouth two times a day as needed for restless legs    . Pseudoeph-Doxylamine-DM-APAP (NYQUIL PO) Take 1 Dose by mouth at bedtime as needed (cough).    . docusate sodium (COLACE) 100 MG capsule Take 1 capsule (100 mg total) by mouth 3 (three) times daily as needed. (Patient not taking: Reported  on 07/13/2017) 20 capsule 0  . oxyCODONE-acetaminophen (ROXICET) 5-325 MG tablet Take 1-2 tablets by mouth every 4 (four) hours as needed for severe pain. (Patient not taking: Reported on 07/13/2017) 40 tablet 0    Home: [place] expects to be discharged to:: Private residence Living Arrangements: Spouse/significant other Available Help at Discharge: Family, Friend(s), Available 24 hours/day Type of Home: House Home Access: Stairs to enter [employer] of Steps: 1 + 1 Entrance Stairs-Rails: None Home Layout: One level Bathroom Shower/Tub: Tub/shower unit, Architectural technologist: Standard Bathroom Accessibility: Yes Home Equipment: None   Functional History: Prior Function Level of Independence:  Independent Comments: Pt was fully independent with ADLs including yard work.  He recently underwent Lt partial repair RTC,per wife (June 2018), and was still undergoing therapy - Dr Tamera Punt   Functional Status:  Mobility: Bed Mobility Overal bed mobility: Needs Assistance Bed Mobility: Supine to Sit Rolling: Mod assist Supine to sit: Mod assist General bed mobility comments: Mod assist to raise trunk.  Able to maintain sitting balance. Transfers Overall transfer level: Needs assistance Equipment used: Rolling walker (2 wheeled) Transfer via Lift Equipment: Stedy Transfers: Sit to/from Stand, Risk manager Sit to Stand: Max assist, Mod assist, +2 physical assistance Stand pivot transfers: Mod assist, +2 physical assistance  Lateral/Scoot Transfers: Mod assist, +2 physical assistance, Max assist General transfer comment: Verbal cues for hand placement.  Patient required +2 mod assist to stand from elevated bed, and +2 max assist to stand from lower BSC.  Mod assist to maintain balance in standing.  Patient able to take several shuffle steps with RW and +2 mod assist to transfer bed > BSC and BSC > recliner. Ambulation/Gait General Gait Details: Unable    ADL: ADL Overall ADL's : Needs assistance/impaired Eating/Feeding: NPO Grooming: Wash/dry hands, Wash/dry face, Applying deodorant, Minimal assistance Upper Body Bathing: Moderate assistance, Cueing for sequencing, Bed level Lower Body Bathing: Total assistance, Bed level Upper Body Dressing : Moderate assistance, Cueing for sequencing, Bed level Lower Body Dressing: Total assistance, Bed level Toilet Transfer: Maximal assistance, +2 for physical assistance, Stand-pivot (stedy ) Toileting- Clothing Manipulation and Hygiene: Sit to/from stand, Total assistance Toileting - Clothing Manipulation Details (indicate cue type and reason): Pt incontinent of stool, and was assisted with peri care  Functional mobility during ADLs:  Maximal assistance, +2 for physical assistance (stedy ) General ADL Comments: Pt. was seen for ADL session. Pt. required mod cues to perform tasks. Pt. is HOH which may be impacting pt. ability to follow directions. Pt. was cooperative during session.   Cognition: Cognition Overall Cognitive Status: Impaired/Different from baseline Orientation Level: Oriented to person, Oriented to place, Oriented to time, Disoriented to situation Cognition Arousal/Alertness: Awake/alert Behavior During Therapy: Flat affect Overall Cognitive Status: Impaired/Different from baseline Area of Impairment: Orientation, Memory, Following commands Orientation Level: Disoriented to, Time, Situation Current Attention Level: Focused Memory: Decreased recall of precautions, Decreased short-term memory Following Commands: Follows one step commands consistently Safety/Judgement: Decreased awareness of safety, Decreased awareness of deficits Awareness: Intellectual Problem Solving: Slow processing, Decreased initiation, Difficulty sequencing General Comments: improvement in cognition today.  Physical Exam: Blood pressure (!) 146/62, pulse 70, temperature 98.1 F (36.7 C), temperature source Oral, resp. rate 19, height _0  (1.88 m), weight 101.9 kg (224 lb 9.6 oz), SpO2 98 %. Physical Exam  Vitals reviewed. Constitutional: He appears well-developed.  Obese  HENT:  Poor dentition. Nasogastric tube in place  Eyes: Right eye exhibits no discharge. Left eye exhibits  no discharge.  Pupils reactive to light  Neck: Normal range of motion. Neck supple. No thyromegaly present.  Cardiovascular: Normal rate and regular rhythm.   Respiratory:  Decreased breath sounds at the bases with shallow respirations. +[place]  GI: Soft. Bowel sounds are normal. He exhibits no distension.  Musculoskeletal: He exhibits edema. He exhibits no tenderness.  Neurological: He is alert.  A&Ox1 Motor: Limited due to pt's ability to follow  commands, grossly 3/5 right deltoid, bicep, tricep, hand grip Left upper extremity has at least 3+/5 strength in the deltoid, bicep, tricep, hand grip Lower extremity strength is 4-/5 at the hip flexors, knee extensors, ankle dorsiflexors. Sensation difficult to assess  HOH  Skin: Skin is warm and dry.  Psychiatric: His affect is blunt. His speech is delayed. He is slowed. Cognition and memory are impaired. He expresses impulsivity.   Results for orders placed or performed during the hospital encounter of 07/13/17 (from the past 48 hour(s))  Glucose, capillary     Status: Abnormal   Collection Time: 08/05/17 11:39 AM  Result Value Ref Range   Glucose-Capillary 115 (H) 65 - 99 mg/dL  Glucose, capillary     Status: Abnormal   Collection Time: 08/05/17  4:12 PM  Result Value Ref Range   Glucose-Capillary 105 (H) 65 - 99 mg/dL  Glucose, capillary     Status: Abnormal   Collection Time: 08/05/17  4:48 PM  Result Value Ref Range   Glucose-Capillary 106 (H) 65 - 99 mg/dL  Glucose, capillary     Status: Abnormal   Collection Time: 08/05/17  7:44 PM  Result Value Ref Range   Glucose-Capillary 166 (H) 65 - 99 mg/dL  Glucose, capillary     Status: Abnormal   Collection Time: 08/05/17 11:43 PM  Result Value Ref Range   Glucose-Capillary 188 (H) 65 - 99 mg/dL  CBC with Differential/Platelet     Status: Abnormal   Collection Time: 08/06/17  3:45 AM  Result Value Ref Range   WBC 13.6 (H) 4.0 - 10.5 K/uL   RBC 2.52 (L) 4.22 - 5.81 MIL/uL   Hemoglobin 7.7 (L) 13.0 - 17.0 g/dL    Comment: REPEATED TO VERIFY SPECIMEN CHECKED FOR CLOTS MATCHES RESULTS FROM 08 REPEATED TO VERIFY SPECIMEN CHECKED FOR CLOTS MATCHES RESULTS FROM [id number] AND BEFORE    HCT 25.3 (L) 39.0 - 52.0 %   MCV 100.4 (H) 78.0 - 100.0 fL   MCH 30.6 26.0 - 34.0 pg   MCHC 30.4 30.0 - 36.0 g/dL   RDW 19.5 (H) 11.5 - 15.5 %   Platelets 331 150 - 400 K/uL   Neutrophils Relative % 87 %   Neutro Abs 11.8 (H) 1.7 - 7.7 K/uL    Lymphocytes Relative 7 %   Lymphs Abs 1.0 0.7 - 4.0 K/uL   Monocytes Relative 5 %   Monocytes Absolute 0.7 0.1 - 1.0 K/uL   Eosinophils Relative 0 %   Eosinophils Absolute 0.0 0.0 - 0.7 K/uL   Basophils Relative 0 %   Basophils Absolute 0.0 0.0 - 0.1 K/uL  Magnesium     Status: None   Collection Time: 08/06/17  3:45 AM  Result Value Ref Range   Magnesium 2.2 1.7 - 2.4 mg/dL  Comprehensive metabolic panel     Status: Abnormal   Collection Time: 08/06/17  3:45 AM  Result Value Ref Range   Sodium 142 135 - 145 mmol/L   Potassium 4.3 3.5 - 5.1 mmol/L   Chloride 110 101 -  111 mmol/L   CO2 26 22 - 32 mmol/L   Glucose, Bld 145 (H) 65 - 99 mg/dL   BUN 43 (H) 6 - 20 mg/dL   Creatinine, Ser 1.10 0.61 - 1.24 mg/dL   Calcium 8.6 (L) 8.9 - 10.3 mg/dL   Total Protein 5.9 (L) 6.5 - 8.1 g/dL   Albumin 2.4 (L) 3.5 - 5.0 g/dL   AST 45 (H) 15 - 41 U/L   ALT 54 17 - 63 U/L   Alkaline Phosphatase 48 38 - 126 U/L   Total Bilirubin 1.0 0.3 - 1.2 mg/dL   GFR calc non Af Amer >60 >60 mL/min   GFR calc Af Amer >60 >60 mL/min    Comment: (NOTE) The eGFR has been calculated using the CKD EPI equation. This calculation has not been validated in all clinical situations. eGFR's persistently <60 mL/min signify possible Chronic Kidney Disease.    Anion gap 6 5 - 15  Glucose, capillary     Status: Abnormal   Collection Time: 08/06/17  3:50 AM  Result Value Ref Range   Glucose-Capillary 141 (H) 65 - 99 mg/dL  Glucose, capillary     Status: Abnormal   Collection Time: 08/06/17  7:28 AM  Result Value Ref Range   Glucose-Capillary 121 (H) 65 - 99 mg/dL  Glucose, capillary     Status: Abnormal   Collection Time: 08/06/17 11:33 AM  Result Value Ref Range   Glucose-Capillary 144 (H) 65 - 99 mg/dL  Glucose, capillary     Status: Abnormal   Collection Time: 08/06/17  5:02 PM  Result Value Ref Range   Glucose-Capillary 157 (H) 65 - 99 mg/dL   Comment 1 Notify RN    Comment 2 Document in Chart   Glucose,  capillary     Status: Abnormal   Collection Time: 08/07/17 12:46 AM  Result Value Ref Range   Glucose-Capillary 126 (H) 65 - 99 mg/dL  Glucose, capillary     Status: Abnormal   Collection Time: 08/07/17  4:22 AM  Result Value Ref Range   Glucose-Capillary 118 (H) 65 - 99 mg/dL  CBC with Differential/Platelet     Status: Abnormal   Collection Time: 08/07/17  5:06 AM  Result Value Ref Range   WBC 13.2 (H) 4.0 - 10.5 K/uL   RBC 2.47 (L) 4.22 - 5.81 MIL/uL   Hemoglobin 7.4 (L) 13.0 - 17.0 g/dL   HCT 24.8 (L) 39.0 - 52.0 %   MCV 100.4 (H) 78.0 - 100.0 fL   MCH 30.0 26.0 - 34.0 pg   MCHC 29.8 (L) 30.0 - 36.0 g/dL   RDW 19.5 (H) 11.5 - 15.5 %   Platelets 373 150 - 400 K/uL   Neutrophils Relative % 77 %   Neutro Abs 10.2 (H) 1.7 - 7.7 K/uL   Lymphocytes Relative 14 %   Lymphs Abs 1.8 0.7 - 4.0 K/uL   Monocytes Relative 8 %   Monocytes Absolute 1.1 (H) 0.1 - 1.0 K/uL   Eosinophils Relative 1 %   Eosinophils Absolute 0.1 0.0 - 0.7 K/uL   Basophils Relative 0 %   Basophils Absolute 0.0 0.0 - 0.1 K/uL  Magnesium     Status: None   Collection Time: 08/07/17  5:06 AM  Result Value Ref Range   Magnesium 2.3 1.7 - 2.4 mg/dL  Basic metabolic panel     Status: Abnormal   Collection Time: 08/07/17  5:06 AM  Result Value Ref Range   Sodium 139 135 -  145 mmol/L   Potassium 4.0 3.5 - 5.1 mmol/L   Chloride 106 101 - 111 mmol/L   CO2 26 22 - 32 mmol/L   Glucose, Bld 130 (H) 65 - 99 mg/dL   BUN 49 (H) 6 - 20 mg/dL   Creatinine, Ser 1.04 0.61 - 1.24 mg/dL   Calcium 8.5 (L) 8.9 - 10.3 mg/dL   GFR calc non Af Amer >60 >60 mL/min   GFR calc Af Amer >60 >60 mL/min    Comment: (NOTE) The eGFR has been calculated using the CKD EPI equation. This calculation has not been validated in all clinical situations. eGFR's persistently <60 mL/min signify possible Chronic Kidney Disease.    Anion gap 7 5 - 15  Glucose, capillary     Status: Abnormal   Collection Time: 08/07/17  7:30 AM  Result Value  Ref Range   Glucose-Capillary 112 (H) 65 - 99 mg/dL   No results found.     Medical Problem List and Plan: 1.  Debilitation secondary to right upper extremity weakness with history of CVA and latest MRI of the brain negative for acute changes/acute hypoxemic respiratory failure. Patient was extubated 07/25/2017 2.  DVT Prophylaxis/Anticoagulation: Eliquis. Monitor for any increased bleeding episodes 3. Pain Management/chronic back pain: Tylenol as needed. Patient on oxycodone 5-360m 1 to 2 tabs every 4 hours as needed prior to admission. Resume as needed 4. Mood/delirium: Provide emotional support. Patient on Wellbutrin 150 mg twice a day as well as Celexa 20 mg daily prior to admission. Discus resuming these medications. 5. Neuropsych: This patient is capable of making decisions on his own behalf. 6. Skin/Wound Care: Routine skin checks 7. Fluids/Electrolytes/Nutrition: Routine I&O's with follow-up chemistries 8. Acute on chronic diastolic congestive heart failure. Monitor for any signs of fluid overload. Follow-up per cardiology services. Lasix 40 mg daily 9. Atrial fibrillation. Continue Eliquis. Amiodarone 200 mg twice a day 10. Dysphagia. Dysphagia #2 nectar liquids after MBS 08/07/2017. Continue tube feeds for now monitor intake and adjust tube feeds as needed 11. History of CVA. Follow-up per neurology services and advised to continue eliquis. 12. Recent left rotator cuff surgery. Per Dr. CTamera Puntnonweightbearing left upper extremity except to assist with standing and able to use rolling walker. No pulling or pushing with left upper extremity. Unrestricted passive range of motion active range of motion. No strengthening restrictions 13. Acute on chronic anemia. Latest transfusion 08/07/2017. Follow-up CBC 14. COPD/tobacco abuse. Counseling. Check oxygen saturations every shift  Post Admission Physician Evaluation: 1. Preadmission assessment reviewed and changes made  below. 2. Functional deficits secondary  to debility. 3. Patient is admitted to receive collaborative, interdisciplinary care between the physiatrist, rehab nursing staff, and therapy team. 4. Patient's level of medical complexity and substantial therapy needs in context of that medical necessity cannot be provided at a lesser intensity of care such as a SNF. 5. Patient has experienced substantial functional loss from his/her baseline which was documented above under the "Functional History" and "Functional Status" headings.  Judging by the patient's diagnosis, physical exam, and functional history, the patient has potential for functional progress which will result in measurable gains while on inpatient rehab.  These gains will be of substantial and practical use upon discharge  in facilitating mobility and self-care at the household level. 6. Physiatrist will provide 24 hour management of medical needs as well as oversight of the therapy plan/treatment and provide guidance as appropriate regarding the interaction of the two. 7. 24 hour rehab nursing will assist  with bladder management, bowel management, skin/wound care, disease management, medication administration and patient education  and help integrate therapy concepts, techniques,education, etc. 8. PT will assess and treat for/with: Lower extremity strength, range of motion, stamina, balance, functional mobility, safety, adaptive techniques and equipment, woundcare, coping skills, pain control, education.   Goals are: Min A. 9. OT will assess and treat for/with: ADL's, functional mobility, safety, upper extremity strength, adaptive techniques and equipment, wound mgt, ego support, and community reintegration.   Goals are: Min A. Therapy may proceed with showering this patient. 10. SLP will assess and treat for/with: swallowing, speech, cognition.  Goals are: Min A. 11. Case Management and Social Worker will assess and treat for psychological issues  and discharge planning. 12. Team conference will be held weekly to assess progress toward goals and to determine barriers to discharge. 13. Patient will receive at least 3 hours of therapy per day at least 5 days per week. 14. ELOS: 16-19 days.       15. Prognosis:  good  Delice Lesch, MD, Mellody Drown Cathlyn Parsons., PA-C 08/07/2017

## 2017-08-07 NOTE — Discharge Summary (Signed)
Advanced Heart Failure Discharge Note  Discharge Summary   Patient ID: VA BROADWELL MRN: [id number], DOB/AGE: 09/09/1946 71 y.o. Admit date: 07/13/2017 D/C date:     08/07/2017   Primary Discharge Diagnoses:  1. Acute hypoxemic respiratory failure 2. Acute on chronic diastolic CHF 3. Cardiogenic and vasodilatory shock 4. Acute kidney injury 5. Elevated LFT's 6. Atrial fibrillation 7. Anemia 8. Delirium   Hospital Course: Mr. Palmeri is a 71 year old male with a past medical history of CVA, HTN, HLD, chronic diastolic CHF, and atrial fibrillation. He presented to the ED on 07/13/17 with chest pain, elevated troponin and SOB. He developed respiratory distress, became hypotensive and bradycardic. He was intubated and required pressor support.   Developed atrial fibrillation with RVR, he was started on Amiodarone gtt. TEE showed EF 55-60%, moderate LVH, moderate AS mean 33, moderate AI, and moderate to severe MR. Developed AKI, which resolved after bicarb gtt and kayexalate. He was extubated on 07/25/17, failed swallow study so CorTrak was placed. Also with intermittent confusion vs. Delirium. Neuro consulted. Head CT without acute CVA, old stoke on right.   He required PRBC's for anemia, Hgb 7.4 FOBT negative.   He will eventually need a TEE and L/RHC to evaluate severe MR. He will go to CIR for rehab prior to considering surgery for his valve disease. See below.  1. Acute hypoxemic respiratory failure: Probable mix of pulmonary edema, PNA and COPD flare.  Extubated 07/25/17.  CVP 5. PCT not elevated, afebrile.  He had a course of prednisone and has completed abx.  - Volume stable.  - CVP 1 - Continue 40 mg lasix daily.  - Abx stopped 07/29/17. (10 day course  for suspected PNA => trach aspirate with Enterobacter and Staph.  ) 2. Acute on chronic diastolic CHF: Baseline has at least moderate valvular disease (moderate AS, moderate AI, moderate-severe MR).  Suspect diastolic CHF triggered  by poorly tolerated paroxysmal atrial fibrillation in setting of valvular disease.  Admission thought to be triggered by diastolic CHF/pulmonary edema.   He developed AKI with lactic acidosis in the setting of hypotension with sedation.  - CVP 1.  - Continues to work with PT. Recommend CIR.  - Per Dr. Aundra Dubin he will need eventual RHC/LHC but will hold off for now given altered mental status and severe debilitation.  Will try to get to CIR, then arrange for TEE to evaluate valves+ RHC/LHC when he is more recovered.   3. Shock: Probably mixed cardiogenic/vasodilatory.  Developed in the setting of sedation with Bipap then intubation as well as bradycardia with sedation.  Resolved, has been off pressors.  - No change.  4. AKI: With hyperkalemia (treated) in setting of shock. Suspect ATN.   - Resolved, creatinine 1.04 5. Elevated LFTs: Improved, likely shock liver.  - Resolved.  6. Atrial fibrillation: Paroxysmal. Remains in NSR with frequent PACs on po amio now. - Continue Eliquis.  7. Anemia:  FOBT negative 8/7. Transferrin saturation was 3% on 8/5. 1u RBCs on 8/17, had feraheme 8/21. No overt bleeding but Hgb 7.4 today.  - Will give 1 unit PRBCs today.   - He will need GI workup eventually.  8. ID: Blx CX NGTD. Suspected PNA.  On vancomycin/cefepime initially, then ciprofloxacin.  Stopped 07/30/17 after 10 day course. Wbc down.  PCT was not elevated 8/21. - no change.  9. COPD: Suspected.  Active smoker.  Was on prednisone to cover exacerbation.  - Stable, no wheeze.  10. Valvular heart  disease: Echo this admission with moderate AS, moderate AI, moderate to severe MR.  May eventually need consideration of surgical valve repair/replacement with Maze given poorly tolerated afib.  - Eventually should have TEE, but would like to see mental status improved. It is possible that this presentation is driven by valvular disease + atrial fibrillation triggering CHF.  He will need a lot of rehab before he is  strong enough for heart surgery. As above, likely would get to CIR then when he has recovered arrange for TEE + LHC/RHC.  - No change.  11. Hypernatremia: Na 139. Continue free water as he cannot take po.     12. Delirium: Ongoing. Follows commands, moves all extremities but disoriented.  Head CT 8/23 with old right-sided CVA (remote), no acute changes.    -  Improved.  71.  H/o CVA: Has history of right MCA territory CVA.  -No change.  14. Now DNR/limited code - Successfully extubated 07/25/17.   - Family would not want CPR or intubation. Pressors ok  - No change.  15. Dysphagia:  - Seen by speech yesterday remains NPO.  Continue tube feeds.  - Will re evaluate today.  16. Deconditioning: Marked.  Needs much PT.   - Seen by inpatient rehab.  17. R wrist gout - Improved after prednisone => last dose today.     Discharge Weight Range: 223 pounds.  Discharge Vitals: Blood pressure (!) 146/62, pulse 70, temperature 98.1 F (36.7 C), temperature source Oral, resp. rate 19, height 6\' 2"  (1.88 m), weight 224 lb 9.6 oz (101.9 kg), SpO2 98 %.  Labs: Lab Results  Component Value Date   WBC 13.2 (H) 08/07/2017   HGB 7.4 (L) 08/07/2017   HCT 24.8 (L) 08/07/2017   MCV 100.4 (H) 08/07/2017   PLT 373 08/07/2017    Recent Labs Lab 08/06/17 0345 08/07/17 0506  NA 142 139  K 4.3 4.0  CL 110 106  CO2 26 26  BUN 43* 49*  CREATININE 1.10 1.04  CALCIUM 8.6* 8.5*  PROT 5.9*  --   BILITOT 1.0  --   ALKPHOS 48  --   ALT 54  --   AST 45*  --   GLUCOSE 145* 130*   Lab Results  Component Value Date   CHOL 120 12/27/2012   HDL 40 12/27/2012   LDLCALC 57 12/27/2012   TRIG 121 07/21/2017   BNP (last 3 results)  Recent Labs  07/13/17 1341  BNP 408.4*    ProBNP (last 3 results) No results for input(s): PROBNP in the last 8760 hours.   Diagnostic Studies/Procedures   Transthoracic Echocardiography 07/14/17 Study Conclusions  - Left ventricle: The cavity size was moderately  dilated. There was   moderate concentric hypertrophy. Systolic function was normal.   The estimated ejection fraction was in the range of 55% to 60%.   There is akinesis of the basalinferior myocardium. Features are   consistent with a pseudonormal left ventricular filling pattern,   with concomitant abnormal relaxation and increased filling   pressure (grade 2 diastolic dysfunction). Doppler parameters are   consistent with high ventricular filling pressure. - Aortic valve: Cusp separation was reduced. The peak aortic   velocity is elevated at 4.74m/sec but this is most likely high   due to increased flow across the AV from moderate AR. The mean   gradient of 16mmHg and VTI ratio of the LVOT/AV is most   consistent with moderate stenosis. There was moderate   regurgitation.  Mean gradient (S): 33 mm Hg. VTI ratio of LVOT to   aortic valve: 0.27. Valve area (VTI): 0.85 cm^2. Valve area   (Vmax): 0.9 cm^2. Valve area (Vmean): 1.11 cm^2. Regurgitation   pressure half-time: 379 ms. - Mitral valve: Calcified annulus. Visually there appears to be   moderate to severe regurgitation. The PISA calculation does   appear accurate (mild MR by PISA). Valve area by pressure   half-time: 2.22 cm^2. Valve area by continuity equation (using   LVOT flow): 1.91 cm^2. - Left atrium: The atrium was moderately dilated. - Pulmonic valve: There was trivial regurgitation. - Pulmonary arteries: Systolic pressure could not be accurately   estimated.  Impressions:  - Compared to echo 04/2017, the mean AV gradient and peak AV   velocity are essentially unchanged.  Recommendations:  Consider TEE to evaluate MR further. Visually appears to be at least moderate to severe MR.   Discharge Medications   Allergies as of 08/07/2017      Reactions   Bee Venom Anaphylaxis, Swelling      Medication List    STOP taking these medications   aspirin EC 81 MG tablet   aspirin-sod bicarb-citric acid 325 MG Tbef  tablet Commonly known as:  ALKA-SELTZER   buPROPion 150 MG 12 hr tablet Commonly known as:  WELLBUTRIN SR   calcium carbonate 500 MG chewable tablet Commonly known as:  TUMS - dosed in mg elemental calcium   citalopram 20 MG tablet Commonly known as:  CELEXA   EPIPEN 2-PAK 0.3 mg/0.3 mL Soaj injection Generic drug:  EPINEPHrine   lisinopril 20 MG tablet Commonly known as:  PRINIVIL,ZESTRIL   lovastatin 20 MG tablet Commonly known as:  MEVACOR   NYQUIL PO   omeprazole 20 MG capsule Commonly known as:  PRILOSEC   ONE-A-DAY MENS 50+ ADVANTAGE Tabs   OVER THE COUNTER MEDICATION     TAKE these medications   amiodarone 200 MG tablet Commonly known as:  PACERONE Place 1 tablet (200 mg total) into feeding tube 2 (two) times daily.   apixaban 5 MG Tabs tablet Commonly known as:  ELIQUIS Place 1 tablet (5 mg total) into feeding tube 2 (two) times daily.   chlorhexidine 0.12 % solution Commonly known as:  PERIDEX 15 mLs by Mouth Rinse route 2 (two) times daily.   docusate sodium 100 MG capsule Commonly known as:  COLACE Take 1 capsule (100 mg total) by mouth 3 (three) times daily as needed.   feeding supplement (JEVITY 1.2 CAL) Liqd Place 1,000 mLs into feeding tube continuous.   feeding supplement (PRO-STAT SUGAR FREE 64) Liqd Place 30 mLs into feeding tube 3 (three) times daily.   free water Soln Place 200 mLs into feeding tube every 6 (six) hours.   furosemide 40 MG tablet Commonly known as:  LASIX Place 1 tablet (40 mg total) into feeding tube daily.   glycopyrrolate 0.2 MG/ML injection Commonly known as:  ROBINUL Inject 1 mL (0.2 mg total) into the vein every 4 (four) hours as needed (secretions, gurgling).   oxyCODONE-acetaminophen 5-325 MG tablet Commonly known as:  ROXICET Take 1-2 tablets by mouth every 4 (four) hours as needed for severe pain.   pantoprazole sodium 40 mg/20 mL Pack Commonly known as:  PROTONIX Place 20 mLs (40 mg total) into  feeding tube daily.   potassium chloride 20 MEQ/15ML (10%) Soln Take 30 mLs (40 mEq total) by mouth daily.   predniSONE 20 MG tablet Commonly known as:  DELTASONE Take  2 tablets (40 mg total) by mouth daily with breakfast.            Discharge Care Instructions        Start     Ordered   08/07/17 0000  amiodarone (PACERONE) 200 MG tablet  2 times daily     08/07/17 1034   08/07/17 0000  apixaban (ELIQUIS) 5 MG TABS tablet  2 times daily     08/07/17 1034   08/07/17 0000  chlorhexidine (PERIDEX) 0.12 % solution  2 times daily     08/07/17 1034   08/07/17 0000  Nutritional Supplements (FEEDING SUPPLEMENT, JEVITY 1.2 CAL,) LIQD  Continuous     08/07/17 1034   08/07/17 0000  Amino Acids-Protein Hydrolys (FEEDING SUPPLEMENT, PRO-STAT SUGAR FREE 64,) LIQD  3 times daily     08/07/17 1034   08/07/17 0000  Water For Irrigation, Sterile (FREE WATER) SOLN  Every 6 hours     08/07/17 1034   08/07/17 0000  furosemide (LASIX) 40 MG tablet  Daily     08/07/17 1034   08/07/17 0000  glycopyrrolate (ROBINUL) 0.2 MG/ML injection  Every 4 hours PRN     08/07/17 1034   08/07/17 0000  pantoprazole sodium (PROTONIX) 40 mg/20 mL PACK  Daily     08/07/17 1034   08/07/17 0000  potassium chloride 20 MEQ/15ML (10%) SOLN  Daily     08/07/17 1034   08/07/17 0000  predniSONE (DELTASONE) 20 MG tablet  Daily with breakfast     08/07/17 1034   08/07/17 0000  Diet - low sodium heart healthy     08/07/17 1034   08/07/17 0000  Increase activity slowly     08/07/17 1034      Disposition   The patient will be discharged in stable condition to home. Discharge Instructions    Diet - low sodium heart healthy    Complete by:  As directed    Increase activity slowly    Complete by:  As directed          Duration of Discharge Encounter: Greater than 35 minutes   Signed, Arbutus Leas, NP 08/07/2017, 10:34 AM

## 2017-08-08 ENCOUNTER — Inpatient Hospital Stay (HOSPITAL_COMMUNITY): Payer: Medicare HMO | Admitting: Physical Therapy

## 2017-08-08 ENCOUNTER — Inpatient Hospital Stay (HOSPITAL_COMMUNITY): Payer: Medicare HMO | Admitting: Speech Pathology

## 2017-08-08 ENCOUNTER — Inpatient Hospital Stay (HOSPITAL_COMMUNITY): Payer: Medicare HMO | Admitting: Occupational Therapy

## 2017-08-08 DIAGNOSIS — R5381 Other malaise: Secondary | ICD-10-CM

## 2017-08-08 DIAGNOSIS — D62 Acute posthemorrhagic anemia: Secondary | ICD-10-CM

## 2017-08-08 DIAGNOSIS — I5033 Acute on chronic diastolic (congestive) heart failure: Secondary | ICD-10-CM

## 2017-08-08 DIAGNOSIS — I4891 Unspecified atrial fibrillation: Secondary | ICD-10-CM

## 2017-08-08 DIAGNOSIS — R1312 Dysphagia, oropharyngeal phase: Secondary | ICD-10-CM

## 2017-08-08 LAB — COMPREHENSIVE METABOLIC PANEL
ALBUMIN: 2.6 g/dL — AB (ref 3.5–5.0)
ALK PHOS: 42 U/L (ref 38–126)
ALT: 64 U/L — AB (ref 17–63)
AST: 46 U/L — AB (ref 15–41)
Anion gap: 7 (ref 5–15)
BILIRUBIN TOTAL: 1.1 mg/dL (ref 0.3–1.2)
BUN: 46 mg/dL — AB (ref 6–20)
CALCIUM: 8.6 mg/dL — AB (ref 8.9–10.3)
CO2: 27 mmol/L (ref 22–32)
CREATININE: 1.14 mg/dL (ref 0.61–1.24)
Chloride: 105 mmol/L (ref 101–111)
GFR calc Af Amer: >60 mL/min (ref >60)
GLUCOSE: 105 mg/dL — AB (ref 65–99)
Potassium: 4.2 mmol/L (ref 3.5–5.1)
Sodium: 139 mmol/L (ref 135–145)
TOTAL PROTEIN: 6.1 g/dL — AB (ref 6.5–8.1)

## 2017-08-08 LAB — CBC WITH DIFFERENTIAL/PLATELET
BASOS ABS: 0 10*3/uL (ref 0.0–0.1)
BASOS PCT: 0 %
Eosinophils Absolute: 0.1 10*3/uL (ref 0.0–0.7)
Eosinophils Relative: 1 %
HEMATOCRIT: 27.8 % — AB (ref 39.0–52.0)
HEMOGLOBIN: 8.5 g/dL — AB (ref 13.0–17.0)
LYMPHS PCT: 16 %
Lymphs Abs: 1.7 10*3/uL (ref 0.7–4.0)
MCH: 29.9 pg (ref 26.0–34.0)
MCHC: 30.6 g/dL (ref 30.0–36.0)
MCV: 97.9 fL (ref 78.0–100.0)
Monocytes Absolute: 0.8 10*3/uL (ref 0.1–1.0)
Monocytes Relative: 8 %
NEUTROS ABS: 8.5 10*3/uL — AB (ref 1.7–7.7)
NEUTROS PCT: 75 %
Platelets: 380 10*3/uL (ref 150–400)
RBC: 2.84 MIL/uL — AB (ref 4.22–5.81)
RDW: 19.7 % — ABNORMAL HIGH (ref 11.5–15.5)
WBC: 11.1 10*3/uL — ABNORMAL HIGH (ref 4.0–10.5)

## 2017-08-08 LAB — BPAM RBC
Blood Product Expiration Date: 201809032359
ISSUE DATE / TIME: 201808271101
Unit Type and Rh: 600

## 2017-08-08 LAB — GLUCOSE, CAPILLARY
GLUCOSE-CAPILLARY: 147 mg/dL — AB (ref 65–99)
GLUCOSE-CAPILLARY: 102 mg/dL — AB (ref 65–99)
GLUCOSE-CAPILLARY: 104 mg/dL — AB (ref 65–99)
Glucose-Capillary: 150 mg/dL — ABNORMAL HIGH (ref 65–99)
Glucose-Capillary: 88 mg/dL (ref 65–99)
Glucose-Capillary: 104 mg/dL — ABNORMAL HIGH (ref 65–99)

## 2017-08-08 LAB — TYPE AND SCREEN
ABO/RH(D): A POS
ANTIBODY SCREEN: NEGATIVE
Unit division: 0

## 2017-08-08 MED ORDER — CITALOPRAM HYDROBROMIDE 10 MG PO TABS
10.0000 mg | ORAL_TABLET | Freq: Every day | ORAL | Status: DC
  Administered 2017-08-08 – 2017-08-21 (×14): 10 mg via ORAL
  Filled 2017-08-08 (×14): qty 1

## 2017-08-08 MED ORDER — SODIUM CHLORIDE 0.9% FLUSH
10.0000 mL | Freq: Two times a day (BID) | INTRAVENOUS | Status: DC
  Administered 2017-08-08: 10 mL

## 2017-08-08 MED ORDER — RESOURCE THICKENUP CLEAR PO POWD
ORAL | Status: DC | PRN
  Filled 2017-08-08 (×2): qty 125

## 2017-08-08 MED ORDER — SODIUM CHLORIDE 0.9% FLUSH
10.0000 mL | INTRAVENOUS | Status: DC | PRN
  Administered 2017-08-08: 10 mL
  Filled 2017-08-08: qty 40

## 2017-08-08 MED ORDER — GLUCERNA SHAKE PO LIQD
237.0000 mL | Freq: Three times a day (TID) | ORAL | Status: DC
  Administered 2017-08-08 – 2017-08-09 (×3): 237 mL via ORAL

## 2017-08-08 NOTE — Progress Notes (Signed)
Patient information reviewed and entered into eRehab system by Daiva Nakayama, RN, CRRN, [place] Coordinator.  Information including medical coding and functional independence measure will be reviewed and updated through discharge.     Per nursing patient and wife given "Data Collection Information Summary for Patients in Inpatient Rehabilitation Facilities with attached "Privacy Act [place] Records" upon admission.

## 2017-08-08 NOTE — Evaluation (Deleted)
Occupational Therapy Assessment and Plan  Patient Details  Name: Brandon [name] MRN: [id number] Date of Birth: 06-26-1946  OT Diagnosis: apraxia, cognitive deficits, disturbance of vision and muscle weakness (generalized) Rehab Potential: Rehab Potential (ACUTE ONLY): Good ELOS: 14-15 days   Today's Date: 08/08/2017 OT Individual Time: 2876-8115 OT Individual Time Calculation (min): 75 min     Problem List:  Patient Active Problem List   Diagnosis Date Noted  . Debility 08/07/2017  . Tobacco abuse   . Acute blood loss anemia   . Anemia of chronic disease   . History of CVA (cerebrovascular accident)   . Adjustment disorder with mixed anxiety and depressed mood   . Chronic low back pain   . Dysphagia   . Agitation   . Pressure injury of skin 07/20/2017  . Ventilator dependence ([place])   . Goals of care, counseling/discussion   . Palliative care encounter   . Acute respiratory failure with hypoxemia ([place])   . Acute pulmonary edema (H[facility])   . Acute on chronic diastolic CHF (congestive heart failure) ([place])   . Hypotensive episode   . Arrhythmia 07/13/2017  . Aortic stenosis, moderate 07/13/2017  . Anemia 07/13/2017  . Dyspnea 07/13/2017  . Respiratory distress 07/13/2017  . S/P left rotator cuff repair 05/18/2017  . Non-rheumatic mitral regurgitation   . HOH (hard of hearing) 06/02/2013  . Obesity (BMI 30-39.9) 06/02/2013  . Acute cholecystitis with chronic cholecystitis 06/01/2013  . Calculus of bile duct without mention of cholecystitis or obstruction 05/31/2013  . Nonspecific elevation of levels of transaminase or lactic acid dehydrogenase (LDH) 05/31/2013  . Nonspecific (abnormal) findings on radiological and other examination of biliary tract 05/31/2013  . TOBACCO ABUSE 02/22/2010  . DYSLIPIDEMIA 06/25/2009  . MITRAL INSUFFICIENCY 06/25/2009  . Essential hypertension 06/25/2009  . Aortic valve disorder 06/25/2009  . VALVULAR HEART DISEASE 06/25/2009  .  Cardiovascular disease 06/25/2009  . CEREBROVASCULAR DISEASE 06/25/2009  . CEREBROVASCULAR ACCIDENT WITH RIGHT HEMIPARESIS 06/25/2009  . COPD exacerbation ([place]) 06/25/2009  . FOOT SURGERY, HX OF 06/25/2009  . POLYPECTOMY, HX OF 06/25/2009  . TONSILLECTOMY, HX OF 06/25/2009    Past Medical History:  Past Medical History:  Diagnosis Date  . Anemia 07/13/2017  . Anxiety   . Aortic insufficiency   . Aortic stenosis, moderate 07/13/2017  . Arthritis    back   . Carotid stenosis    Right carotid stent (widely patent) 40 - 59% left plaque 11/13  . Depression   . Dyslipidemia   . GERD (gastroesophageal reflux disease)   . Heart murmur   . Hemiplegia affecting unspecified side, late effect of cerebrovascular disease    resolved- from L side   . Hypertension   . Jaundice    resolved following ERCP & Cholecystectomy  . Mitral valve insufficiency and aortic valve insufficiency   . Pre-diabetes    per spouse  . Sleep apnea    does not wear CPAP  . Sleep concern    resulted in surgery- after + sleep test. Pt. doesn't have a problem any longer.   . Stroke ([place]) 03/11/2003   stent placed on the 31, 3, 2004, L side   . Wears glasses   . Wears hearing aid in both ears   . Wears partial dentures    Past Surgical History:  Past Surgical History:  Procedure Laterality Date  . BACK SURGERY     lumbar back  . CHOLECYSTECTOMY    . ERCP N/A 05/31/2013   Procedure:  ENDOSCOPIC RETROGRADE CHOLANGIOPANCREATOGRAPHY (ERCP);  Surgeon: Ladene Artist, MD;  Location: [facility];  Service: Endoscopy;  Laterality: N/A;  . FOOT SURGERY     right  . LAPAROSCOPIC CHOLECYSTECTOMY SINGLE PORT N/A 06/01/2013   Procedure: LAPAROSCOPIC CHOLECYSTECTOMY SINGLE PORT;  Surgeon: Adin Hector, MD;  Location: [facility];  Service: General;  Laterality: N/A;  . POLYPECTOMY    . SHOULDER ARTHROSCOPY WITH ROTATOR CUFF REPAIR AND SUBACROMIAL DECOMPRESSION Left 05/18/2017  . SHOULDER ARTHROSCOPY WITH ROTATOR CUFF REPAIR  AND SUBACROMIAL DECOMPRESSION Left 05/18/2017   Procedure: SHOULDER ARTHROSCOPY WITH ROTATOR CUFF REPAIR AND SUBACROMIAL DECOMPRESSION;  Surgeon: Tania Ade, MD;  Location: [facility];  Service: Orthopedics;  Laterality: Left;  LEFT SHOULDER ARTHROSCOPY WITH ROTATOR CUFF REPAIR AND SUBACROMIAL DECOMPRESSION  . TEE WITHOUT CARDIOVERSION N/A 05/09/2017   Procedure: TRANSESOPHAGEAL ECHOCARDIOGRAM (TEE);  Surgeon: Skeet Latch, MD;  Location: [facility] ENDOSCOPY;  Service: Cardiovascular;  Laterality: N/A;  . TONSILLECTOMY      Assessment & Plan Clinical Impression:  Brandon [name] is a 71 y.o. right handed male with history of CVA with carotid stenting, hypertension, aortic stenosis, tobacco abuse, [name], Chronic back pain and recent rotator cuff surgery 05/18/2017.  Presented 07/13/2017 for nonspecific chest pain, diaphoresis elevated troponin 0.12. History taken from chart review and wife. Patient lives with spouse reported to be independent prior to admission. One level home with 1 step to entry. Chest x-ray with interval development of pulmonary edema. Treated with intravenous Heparin, Lasix , Lopressor added for bouts of atrial fibrillation with RVR/troponin 0.12 and heparin was transitioned to eliquis.[name] He did develop some agitation received Xanax as well as Haldol. Echocardiogram with ejection fraction of 65% grade 2 diastolic dysfunction. Patient did require intubation for acute hypoxic respiratory failure 07/14/2017. TEE showed moderate LVH, moderate AI with moderate to severe mitral regurgitation. Venous Dopplers lower extremities negative for DVT. He was extubated 07/25/2017 with course complicated by suspect pneumonia. Antibiotics completed 07/29/2017 as well as completing course of prednisone.. Ongoing monitoring of mental status/Delirium with cranial CT/MRI scan 08/04/2017 showed no acute intracranial abnormalities with old remote right MCA territory infarct. Palliative care was  consulted to establish goals of care. A Cor-Track tube was placed 07/26/2017 for nutritional support andModified barium swallow completed 08/07/2017 placed on a dysphagia #2 nectar thick liquid diet . Physical therapy evaluation completed and ongoing noting debilitation. The clarification of weightbearing status of left upper extremity after rotator cuff surgery Is nonweightbearing left upper extremity except help with standing and using a rolling walker. No pulling or pushing with left upper extremity. Acute on chronic anemia 7.4 and monitored with plan transfusion 1 unit 08/07/2017. Patient passed swallow and advanced to D2 diet.  Pain meds changed from IV fentanyl, which he had been receiving ~5/day. M.D. has requested physical medicine rehabilitation consult. Patient was admitted for comprehensive rehabilitation program. Patient transferred to CIR on 08/07/2017 .    Patient currently requires mod with basic self-care skills secondary to muscle weakness, decreased cardiorespiratoy endurance, motor apraxia and decreased coordination, decreased visual perceptual skills and decreased visual motor skills, decreased motor planning, decreased attention, decreased awareness, decreased problem solving, decreased safety awareness, decreased memory and delayed processing and decreased sitting balance, decreased standing balance and decreased balance strategies.  Prior to hospitalization, patient  Patient will benefit from skilled intervention to increase independence with basic self-care skills prior to discharge home with care partner.  Anticipate patient will require 24 hour supervision and follow up home health.  OT -  End of Session Activity Tolerance: Tolerates < 10 min activity, no significant change in vital signs Endurance Deficit: Yes Endurance Deficit Description: fatigues after 2-3 minutes OT Assessment Rehab Potential (ACUTE ONLY): Good OT Patient demonstrates impairments in the following area(s):  Balance;Cognition;Endurance;Motor;Perception;Safety;Vision OT Basic ADL's Functional Problem(s): Grooming;Bathing;Dressing;Toileting OT Transfers Functional Problem(s): Toilet;Tub/Shower OT Additional Impairment(s): None OT Plan OT Intensity: Minimum of 1-2 x/day, 45 to 90 minutes OT Frequency: 5 out of 7 days OT Duration/Estimated Length of Stay: 14-15 days OT Treatment/Interventions: Balance/vestibular training;Cognitive remediation/compensation;Discharge planning;DME/adaptive equipment instruction;Functional mobility training;Pain management;Patient/family education;Self Care/advanced ADL retraining;Therapeutic Exercise;Therapeutic Activities;UE/LE Strength taining/ROM;UE/LE Coordination activities;Visual/perceptual remediation/compensation OT Self Feeding Anticipated Outcome(s): supervision/ set up OT Basic Self-Care Anticipated Outcome(s): Supervision OT Toileting Anticipated Outcome(s): Supervision OT Bathroom Transfers Anticipated Outcome(s): Supervision OT Recommendation Patient destination: Home Follow Up Recommendations: Home health OT Equipment Recommended: Tub/shower bench;3 in 1 bedside comode   Skilled Therapeutic Intervention Pt seen for initial evaluation, education with pt and spouse, and ADL training.  Pt was very lethargic this am, requiring max cues to attend to task as his attention span was less than a minute at a time. He demonstrated fluctuating praxis. Pt is very HOH even with hearing aids and often looks to his spouse for A with answering questions. His expression skills are impaired with with both vocal quality and using the correct words.  Overall he required mod A.  Discussed OT role, goals, estimated LOS with pt and wife.  They were in agreement. Pt resting in bed with all needs met and spouse in room with pt.   OT Evaluation Precautions/Restrictions  Precautions Precautions: Shoulder;Fall Precaution Comments: Per Dr Tamera Punt (pt s/p Lt RCR early June) NWB Lt  UE except to assist with standing and able to use RW; No pulling or pushing with Lt UE;  unrestricted PROM/AROM is okay.  No strengthening   Restrictions Weight Bearing Restrictions: Yes LUE Weight Bearing: Non weight bearing (LUE- RC precautions )   Pain Pain Assessment Pain Assessment: No/denies pain Faces Pain Scale: Hurts little more Pain Type: Chronic pain Pain Location: Back Pain Descriptors / Indicators: Aching;Sore Pain Onset: On-going Pain Intervention(s): Repositioned;Relaxation Home Living/Prior Functioning Home Living Family/patient expects to be discharged to:: Private residence Living Arrangements: Spouse/significant other Available Help at Discharge: Family, Friend(s), Available 24 hours/day Type of Home: House Home Access: Stairs to enter [employer] of Steps: 1 + 1 Entrance Stairs-Rails: None Home Layout: One level Bathroom Shower/Tub: Tub/shower unit, Architectural technologist: Standard  Lives With: Spouse Prior Function Comments: Pt was fully independent with ADLs including yard work.  He recently underwent Lt partial repair RTC,per wife (June 2018), and was still undergoing therapy - Dr Tamera Punt  ADL ADL ADL Comments: refer to functional navigator    REFER TO [place]   See Function Navigator for Current Functional Status.   Refer to Care Plan for Long Term Goals  Recommendations for other services: None    Discharge Criteria: Patient will be discharged from OT if patient refuses treatment 3 consecutive times without medical reason, if treatment goals not met, if there is a change in medical status, if patient makes no progress towards goals or if patient is discharged from hospital.  The above assessment, treatment plan, treatment alternatives and goals were discussed and mutually agreed upon: by patient and by family  SAGUIER,JULIA 08/08/2017, 12:27 PM

## 2017-08-08 NOTE — Progress Notes (Signed)
[place] PHYSICAL MEDICINE & REHABILITATION     PROGRESS NOTE    Subjective/Complaints: Was confused last night. Tele-sitter in room. Didn't sleep too much. Incontinent of bowel, sometimes incontinent of bladder too---has problems managing urinal. Mild low back pain  ROS: pt denies nausea, vomiting, diarrhea, cough, shortness of breath or chest pain   Objective: Vital Signs: Blood pressure (!) 142/60, pulse 67, temperature 97.8 F (36.6 C), temperature source Oral, resp. rate 16, height 5\' 9"  (1.753 m), weight 101.2 kg (223 lb), SpO2 98 %. No results found.  Recent Labs  08/07/17 0506 08/08/17 0342  WBC 13.2* 11.1*  HGB 7.4* 8.5*  HCT 24.8* 27.8*  PLT 373 380    Recent Labs  08/07/17 0506 08/08/17 0342  NA 139 139  K 4.0 4.2  CL 106 105  GLUCOSE 130* 105*  BUN 49* 46*  CREATININE 1.04 1.14  CALCIUM 8.5* 8.6*   CBG (last 3)   Recent Labs  08/07/17 0422 08/07/17 0730 08/07/17 1110  GLUCAP 118* 112* 116*    Wt Readings from Last 3 Encounters:  08/08/17 101.2 kg (223 lb)  08/07/17 101.9 kg (224 lb 9.6 oz)  05/18/17 114.3 kg (252 lb)    Physical Exam:   Constitutional: He appears well-developed. No distress Obese  HENT:  Poor dentition.   Eyes: Right eye exhibits no discharge. Left eye exhibits no discharge.  Pupils reactive to light  Neck: Normal range of motion. Neck supple. No thyromegaly present.  Cardiovascular: RRR without murmur. No JVD    Respiratory:  Fair air movements. decreasd BS at bases GI: NT/ND.  Musculoskeletal: He exhibits peripheral tr edema. He exhibits no tenderness.  Neurological: He is alert.  A&Ox1 to person. Follows simple commands. Distracted. Speech sometimes a little garbled.  Motor: RUE: 3 to 3+/5 right deltoid, bicep, tricep, hand grip Left upper extremity has at least 3+/5 strength in the deltoid, bicep, tricep, hand grip Lower extremity strength is 3+/5 at the hip flexors, 4- knee extensors, ankle dorsiflexors.  Senses pain in all 4's.   HOH  Skin: Skin is warm and dry.  Psychiatric: distracted, impulsive, generally pleasant    Assessment/Plan: 1. Functional deficits secondary to debility/prior CVA which require 3+ hours per day of interdisciplinary therapy in a comprehensive inpatient rehab setting. Physiatrist is providing close team supervision and 24 hour management of active medical problems listed below. Physiatrist and rehab team continue to assess barriers to discharge/monitor patient progress toward functional and medical goals.  Function:  Bathing Bathing position      Bathing parts      Bathing assist        Upper Body Dressing/Undressing Upper body dressing                    Upper body assist        Lower Body Dressing/Undressing Lower body dressing                                  Lower body assist        Toileting Toileting          Toileting assist     Transfers Chair/bed transfer             Locomotion Ambulation           Wheelchair          Cognition Comprehension Comprehension assist level: Understands basic 25 -  49% of the time/ requires cueing 50 - 75% of the time  Expression Expression assist level: Expresses basic 50 - 74% of the time/requires cueing 25 - 49% of the time. Needs to repeat parts of sentences.  Social Interaction Social Interaction assist level: Interacts appropriately 75 - 89% of the time - Needs redirection for appropriate language or to initiate interaction.  Problem Solving Problem solving assist level: Solves basic less than 25% of the time - needs direction nearly all the time or does not effectively solve problems and may need a restraint for safety  Memory Memory assist level: Recognizes or recalls less than 25% of the time/requires cueing greater than 75% of the time    Medical Problem List and Plan: 1.  Debilitation and right upper extremity weakness due to history of CVA/acute hypoxemic  respiratory failure. Patient was extubated 07/25/2017  -beginning therapies today  -team conference this afternoon 2.  DVT Prophylaxis/Anticoagulation: Eliquis. Monitor for any increased bleeding episodes 3. Pain Management/chronic back pain: Tylenol as needed. Patient on oxycodone 5-325mg  1 to 2 tabs every 4 hours as needed prior to admission. Resume as needed  -would like to limit given ongoing encephalopathy 4. Mood/delirium: Provide emotional support. Patient on Wellbutrin 150 mg twice a day as well as Celexa 20 mg daily prior to admission.   -resume celexa 10mg  QHS  -sleep chart  -trazodone prn 5. Neuropsych: This patient is capable of making decisions on his own behalf. 6. Skin/Wound Care: Routine skin checks 7. Fluids/Electrolytes/Nutrition: encourage fluids  -I personally reviewed the patient's labs today. BUN/Cr near baseline   8. Acute on chronic diastolic congestive heart failure. Monitor for any signs of fluid overload.   Filed Weights   08/07/17 1600 08/08/17 0535  Weight: 103.4 kg (228 lb) 101.2 kg (223 lb)    - Follow-up per cardiology services. Lasix 40 mg daily 9. Atrial fibrillation. Continue Eliquis. Amiodarone 200 mg twice a day 10. Dysphagia. Dysphagia #2 nectar liquids after MBS 08/07/2017.   -protein supp  -encourage fluids 11. History of CVA. Follow-up per neurology services and advised to continue eliquis. 12. Recent left rotator cuff surgery. Per Dr. Tamera Punt nonweightbearing left upper extremity except to assist with standing and able to use rolling walker. No pulling or pushing with left upper extremity. Unrestricted passive range of motion active range of motion. No strengthening restrictions 13. Acute on chronic anemia. Latest transfusion 08/07/2017. Follow-up hgb 8.5 today  -follow serially 14. COPD/tobacco abuse. Counseling. Check oxygen saturations every shift 15. CKD   LOS (Days) 1 A FACE TO FACE EVALUATION WAS PERFORMED  Meredith Staggers,  MD 08/08/2017 8:50 AM

## 2017-08-08 NOTE — Evaluation (Signed)
Occupational Therapy Assessment and Plan  Patient Details  Name: Brandon [name] MRN: [id number] Date of Birth: 17-Mar-1946  OT Diagnosis: apraxia, cognitive deficits and muscle weakness (generalized) Rehab Potential: Rehab Potential (ACUTE ONLY): Good ELOS: 14-15 days   Today's Date: 08/08/2017 OT Individual Time: 2426-8341 OT Individual Time Calculation (min): 75 min     Problem List:  Patient Active Problem List   Diagnosis Date Noted  . Debility 08/07/2017  . Tobacco abuse   . Acute blood loss anemia   . Anemia of chronic disease   . History of CVA (cerebrovascular accident)   . Adjustment disorder with mixed anxiety and depressed mood   . Chronic low back pain   . Dysphagia   . Agitation   . Pressure injury of skin 07/20/2017  . Ventilator dependence ([place])   . Goals of care, counseling/discussion   . Palliative care encounter   . Acute respiratory failure with hypoxemia ([place])   . Acute pulmonary edema ([facility])   . Acute on chronic diastolic CHF (congestive heart failure) ([place])   . Hypotensive episode   . Arrhythmia 07/13/2017  . Aortic stenosis, moderate 07/13/2017  . Anemia 07/13/2017  . Dyspnea 07/13/2017  . Respiratory distress 07/13/2017  . S/P left rotator cuff repair 05/18/2017  . Non-rheumatic mitral regurgitation   . HOH (hard of hearing) 06/02/2013  . Obesity (BMI 30-39.9) 06/02/2013  . Acute cholecystitis with chronic cholecystitis 06/01/2013  . Calculus of bile duct without mention of cholecystitis or obstruction 05/31/2013  . Nonspecific elevation of levels of transaminase or lactic acid dehydrogenase (LDH) 05/31/2013  . Nonspecific (abnormal) findings on radiological and other examination of biliary tract 05/31/2013  . TOBACCO ABUSE 02/22/2010  . DYSLIPIDEMIA 06/25/2009  . MITRAL INSUFFICIENCY 06/25/2009  . Essential hypertension 06/25/2009  . Aortic valve disorder 06/25/2009  . VALVULAR HEART DISEASE 06/25/2009  . Cardiovascular disease 06/25/2009   . CEREBROVASCULAR DISEASE 06/25/2009  . CEREBROVASCULAR ACCIDENT WITH RIGHT HEMIPARESIS 06/25/2009  . COPD exacerbation ([place]) 06/25/2009  . FOOT SURGERY, HX OF 06/25/2009  . POLYPECTOMY, HX OF 06/25/2009  . TONSILLECTOMY, HX OF 06/25/2009    Past Medical History:  Past Medical History:  Diagnosis Date  . Anemia 07/13/2017  . Anxiety   . Aortic insufficiency   . Aortic stenosis, moderate 07/13/2017  . Arthritis    back   . Carotid stenosis    Right carotid stent (widely patent) 40 - 59% left plaque 11/13  . Depression   . Dyslipidemia   . GERD (gastroesophageal reflux disease)   . Heart murmur   . Hemiplegia affecting unspecified side, late effect of cerebrovascular disease    resolved- from L side   . Hypertension   . Jaundice    resolved following ERCP & Cholecystectomy  . Mitral valve insufficiency and aortic valve insufficiency   . Pre-diabetes    per spouse  . Sleep apnea    does not wear CPAP  . Sleep concern    resulted in surgery- after + sleep test. Pt. doesn't have a problem any longer.   . Stroke ([place]) 03/11/2003   stent placed on the 31, 3, 2004, L side   . Wears glasses   . Wears hearing aid in both ears   . Wears partial dentures    Past Surgical History:  Past Surgical History:  Procedure Laterality Date  . BACK SURGERY     lumbar back  . CHOLECYSTECTOMY    . ERCP N/A 05/31/2013   Procedure: ENDOSCOPIC RETROGRADE CHOLANGIOPANCREATOGRAPHY (  ERCP);  [name]: Ladene Artist, MD;  Location: [facility];  Service: Endoscopy;  Laterality: N/A;  . FOOT SURGERY     right  . LAPAROSCOPIC CHOLECYSTECTOMY SINGLE PORT N/A 06/01/2013   Procedure: LAPAROSCOPIC CHOLECYSTECTOMY SINGLE PORT;  [name]: Adin Hector, MD;  Location: [facility];  Service: General;  Laterality: N/A;  . POLYPECTOMY    . SHOULDER ARTHROSCOPY WITH ROTATOR CUFF REPAIR AND SUBACROMIAL DECOMPRESSION Left 05/18/2017  . SHOULDER ARTHROSCOPY WITH ROTATOR CUFF REPAIR AND SUBACROMIAL DECOMPRESSION  Left 05/18/2017   Procedure: SHOULDER ARTHROSCOPY WITH ROTATOR CUFF REPAIR AND SUBACROMIAL DECOMPRESSION;  [name]: Tania Ade, MD;  Location: [facility];  Service: Orthopedics;  Laterality: Left;  LEFT SHOULDER ARTHROSCOPY WITH ROTATOR CUFF REPAIR AND SUBACROMIAL DECOMPRESSION  . TEE WITHOUT CARDIOVERSION N/A 05/09/2017   Procedure: TRANSESOPHAGEAL ECHOCARDIOGRAM (TEE);  [name]: Skeet Latch, MD;  Location: [facility];  Service: Cardiovascular;  Laterality: N/A;  . TONSILLECTOMY      Assessment & Plan Clinical Impression: Brandon R Clingmanis a 71 y.o.right handed malewith history of CVA with carotid stenting, hypertension, aortic stenosis, tobacco abuse, [name], Chronic back pain and recent rotator cuff surgery 05/18/2017. Presented 07/13/2017 for nonspecific chest pain, diaphoresis elevated troponin 0.12. History taken from chart review and wife. Patient lives with spouse reported to be independent prior to admission. One level home with 1 step to entry. Chest x-ray with interval development of pulmonary edema. Treated with intravenous Heparin, Lasix , Lopressor added for bouts of atrial fibrillation with RVR/troponin 0.12 and heparin was transitioned to eliquis.[name] He did develop some agitation received Xanax as well as Haldol. Echocardiogram with ejection fraction of 92% grade 2 diastolic dysfunction. Patient did require intubation for acute hypoxic respiratory failure 07/14/2017. TEE showed moderate LVH, moderate AI with moderate to severe mitral regurgitation. Venous Dopplers lower extremities negative for DVT. He was extubated 07/25/2017 with course complicated by suspect pneumonia. Antibiotics completed 07/29/2017 as well as completing course of prednisone.. Ongoing monitoring of mental status/Delirium with cranial CT/MRI scan 08/04/2017 showed no acute intracranial abnormalities with old remote right MCA territory infarct. Palliative care was consulted to establish goals of  care.A Cor-Track tube was placed 07/26/2017 for nutritional support andModified barium swallow completed 08/07/2017 placed on a dysphagia #2 nectar thick liquid diet .Physical therapy evaluation completed and ongoing noting debilitation. The clarification of weightbearing status of left upper extremity after rotator cuff surgery Is nonweightbearing left upper extremity except help with standing and using a rolling walker. No pulling or pushing with left upper extremity. Acute on chronic anemia 7.4 and monitored with plan transfusion 1 unit 08/07/2017. Patient passed swallow and advanced to D2 diet. Pain meds changed from IV fentanyl, which he had been receiving ~5/day. M.D. has requested physical medicine rehabilitation consult. Patient was admitted for comprehensive rehabilitation program. Patient transferred to CIR on 08/07/2017 .    Patient currently requires mod with basic self-care skills secondary to muscle weakness, decreased cardiorespiratoy endurance, motor apraxia and decreased coordination, decreased visual perceptual skills and decreased visual motor skills, decreased motor planning, decreased attention, decreased awareness, decreased problem solving, decreased safety awareness, decreased memory and delayed processing and decreased sitting balance, decreased standing balance and decreased balance strategies.  Prior to hospitalization, patient   Patient will benefit from skilled intervention to increase independence with basic self-care skills prior to discharge home with care partner.  Anticipate patient will require 24 hour supervision and follow up home health.   OT - End of Session Activity Tolerance: Tolerates < 10 min  activity, no significant change in vital signs Endurance Deficit: Yes Endurance Deficit Description: Pt only able to walk 15 ft due to fatigue  OT Assessment Rehab Potential (ACUTE ONLY): Good OT Patient demonstrates impairments in the following area(s):  Balance;Cognition;Endurance;Motor;Perception;Safety;Vision OT Basic ADL's Functional Problem(s): Grooming;Bathing;Dressing;Toileting OT Transfers Functional Problem(s): Toilet;Tub/Shower OT Additional Impairment(s): None OT Plan OT Intensity: Minimum of 1-2 x/day, 45 to 90 minutes OT Frequency: 5 out of 7 days OT Duration/Estimated Length of Stay: 14-15 days OT Treatment/Interventions: Balance/vestibular training;Cognitive remediation/compensation;Discharge planning;DME/adaptive equipment instruction;Functional mobility training;Pain management;Patient/family education;Self Care/advanced ADL retraining;Therapeutic Exercise;Therapeutic Activities;UE/LE Strength taining/ROM;UE/LE Coordination activities;Visual/perceptual remediation/compensation OT Self Feeding Anticipated Outcome(s): supervision/ set up OT Basic Self-Care Anticipated Outcome(s): Supervision OT Toileting Anticipated Outcome(s): Supervision OT Bathroom Transfers Anticipated Outcome(s): Supervision OT Recommendation Patient destination: Home Follow Up Recommendations: Home health OT Equipment Recommended: Tub/shower bench;3 in 1 bedside comode   Skilled Therapeutic Intervention Pt seen for initial evaluation, education with pt and spouse, and ADL training.  Pt was very lethargic this am, requiring max cues to attend to task as his attention span was less than a minute at a time. He demonstrated fluctuating praxis. Pt is very HOH even with hearing aids and often looks to his spouse for A with answering questions. His expression skills are impaired with with both vocal quality and using the correct words.  Overall he required mod A.  Discussed OT role, goals, estimated LOS with pt and wife.  They were in agreement. Pt resting in bed with all needs met and spouse in room with pt.   OT Evaluation Precautions/Restrictions  Precautions Precautions: Shoulder;Fall Restrictions Weight Bearing Restrictions: Yes LUE Weight Bearing: Non  weight bearing (LUE- RC precautions )  Pain Pain Assessment Pain Assessment: No/denies pain Home Living/Prior Functioning Home Living Family/patient expects to be discharged to:: Private residence Living Arrangements: Spouse/significant other Available Help at Discharge: Available 24 hours/day, Family Type of Home: House Home Access: Stairs to enter Technical brewer of Steps: 1 + 1 Entrance Stairs-Rails: None Home Layout: One level Bathroom Shower/Tub: Tub/shower unit, Architectural technologist: Standard  Lives With: Spouse Prior Function Vocation: Retired Comments: Pt was fully independent with ADLs including yard work.  He recently underwent Lt partial repair RTC,per wife (June 2018), and was still undergoing therapy - Dr Tamera Punt  ADL ADL ADL Comments: refer to functional navigator Vision Baseline Vision/History: Wears glasses Vision Assessment?: Yes Eye Alignment: Within Functional Limits Ocular Range of Motion: Within Functional Limits Alignment/Gaze Preference: Within Defined Limits Tracking/Visual Pursuits: Decreased smoothness of vertical tracking Visual Fields: No apparent deficits Depth Perception:  (pt reports difficulty, but did not observe in ADLs) Additional Comments: no visual deficits effecting mobility Perception  Perception: Within Functional Limits Praxis Praxis: Impaired Praxis Impairment Details: Motor planning Cognition Overall Cognitive Status: Impaired/Different from baseline Arousal/Alertness: Lethargic Orientation Level: Person;Place;Situation Person: Oriented Place: Oriented Situation: Disoriented (pt stated he was here for a stroke) Year: 2018 Month: September Day of Week: Correct Memory: Impaired Memory Impairment: Decreased recall of new information;Retrieval deficit;Storage deficit Immediate Memory Recall: Sock;Blue;Bed Memory Recall: Sock;Blue;Bed Memory Recall Sock: With Cue Memory Recall Blue: Without Cue Memory Recall Bed:   (unable to recall with 2 cues) Attention: Sustained Sustained Attention: Impaired Sustained Attention Impairment: Verbal basic;Functional basic Awareness: Impaired Awareness Impairment: Intellectual impairment Problem Solving: Impaired Problem Solving Impairment: Functional basic;Verbal basic Executive Function: Self Monitoring;Self Correcting Self Monitoring: Impaired Self Monitoring Impairment: Verbal basic;Functional basic Self Correcting: Impaired Self Correcting Impairment: Verbal basic;Functional basic Behaviors: Perseveration Safety/Judgment: Impaired Sensation Sensation Light  Touch: Appears Intact Stereognosis: Appears Intact Hot/Cold: Appears Intact Proprioception: Not tested (Pt unable to follow directions for testing) Coordination Gross Motor Movements are Fluid and Coordinated: No (Tested functionally) Fine Motor Movements are Fluid and Coordinated: No Coordination and Movement Description: slow and delayed  Finger Nose Finger Test: R and L 8x in 10 seconds Motor  Motor Motor: Other (comment);Motor apraxia (difficulty initiating movement with commands) Motor - Skilled Clinical Observations: AROM WFL, overall strength 4-/5 Mobility  Bed Mobility Bed Mobility: Rolling Right;Supine to Sit;Sit to Supine Rolling Right: 4: Min assist Rolling Right Details: Tactile cues for initiation;Visual cues/gestures for sequencing Rolling Left: 4: Min assist Rolling Left Details: Tactile cues for initiation;Verbal cues for sequencing Supine to Sit: 4: Min assist;With rails Supine to Sit Details: Tactile cues for initiation;Verbal cues for sequencing Sit to Supine: Not Tested (comment) (Pt stayed up in chair at end of session) Transfers Sit to Stand: 3: Mod assist Sit to Stand Details: Manual facilitation for weight shifting;Verbal cues for sequencing;Verbal cues for safe use of DME/AE Stand to Sit: 4: Min assist Stand to Sit Details (indicate cue type and reason): Verbal cues  for sequencing;Verbal cues for safe use of DME/AE  Trunk/Postural Assessment  Cervical Assessment Cervical Assessment: Within Functional Limits Thoracic Assessment Thoracic Assessment:  (mild kyphosis) Lumbar Assessment Lumbar Assessment:  (posterior pelvic tilt) Postural Control Postural Control: Deficits on evaluation Protective Responses: delayed  Balance Balance Balance Assessed: Yes Static Sitting Balance Static Sitting - Level of Assistance: 5: Stand by assistance Dynamic Sitting Balance Dynamic Sitting - Level of Assistance: 5: Stand by assistance Sitting balance - Comments: Pt able to maintain sitting balance during MMT but with some swaying Static Standing Balance Static Standing - Level of Assistance: 4: Min assist (with bilat UE support ) Dynamic Standing Balance Dynamic Standing - Level of Assistance: Not tested (comment) (due to pt fatigue & dec response to commands ) Extremity/Trunk Assessment RUE Assessment RUE Assessment: Within Functional Limits - 4-/5  LUE Assessment LUE Assessment: Exceptions to [facility] (RC precautions; sx in June 2018) - 4-/5   See Function Navigator for Current Functional Status.   Refer to Care Plan for Long Term Goals  Recommendations for other services: None    Discharge Criteria: Patient will be discharged from OT if patient refuses treatment 3 consecutive times without medical reason, if treatment goals not met, if there is a change in medical status, if patient makes no progress towards goals or if patient is discharged from hospital.  The above assessment, treatment plan, treatment alternatives and goals were discussed and mutually agreed upon: by patient and by family  SAGUIER,JULIA 08/08/2017, 2:37 PM

## 2017-08-08 NOTE — Discharge Instructions (Addendum)
Inpatient Rehab Discharge Instructions  Brandon [name] Discharge date and time: No discharge date for patient encounter.   Activities/Precautions/ Functional Status: Activity: activity as tolerated Diet:  Wound Care: none needed Functional status:  ___ No restrictions     ___ Walk up steps independently ___ 24/7 supervision/assistance   ___ Walk up steps with assistance ___ Intermittent supervision/assistance  ___ Bathe/dress independently ___ Walk with walker     _x__ Bathe/dress with assistance ___ Walk Independently    ___ Shower independently ___ Walk with assistance    ___ Shower with assistance ___ No alcohol     ___ Return to work/school ________  COMMUNITY REFERRALS UPON DISCHARGE:   Home Health:   PT     OT     ST     RN   Agency:  [facility] Phone:  [phone] Medical Equipment/Items Ordered:  Rolling walker and tub transfer bench  Agency/Supplier:  [facility]       Phone:  [phone]  Special Instructions:    My questions have been answered and I understand these instructions. I will adhere to these goals and the provided educational materials after my discharge from the hospital.  Patient/Caregiver Signature _______________________________ Date __________  Clinician Signature _______________________________________ Date __________  Please bring this form and your medication list with you to all your follow-up doctor's appointments. Information on my medicine - ELIQUIS (apixaban)  This medication education was reviewed with me or my healthcare representative as part of my discharge preparation.    Why was Eliquis prescribed for you? Eliquis was prescribed for you to reduce the risk of a blood clot forming that can cause a stroke if you have a medical condition called atrial fibrillation (a type of irregular heartbeat).  What do You need to know about Eliquis ? Take your Eliquis TWICE DAILY - one tablet in the morning and one tablet  in the evening with or without food. If you have difficulty swallowing the tablet whole please discuss with your pharmacist how to take the medication safely.  Take Eliquis exactly as prescribed by your doctor and DO NOT stop taking Eliquis without talking to the doctor who prescribed the medication.  Stopping may increase your risk of developing a stroke.  Refill your prescription before you run out.  After discharge, you should have regular check-up appointments with your healthcare provider that is prescribing your Eliquis.  In the future your dose may need to be changed if your kidney function or weight changes by a significant amount or as you get older.  What do you do if you miss a dose? If you miss a dose, take it as soon as you remember on the same day and resume taking twice daily.  Do not take more than one dose of ELIQUIS at the same time to make up a missed dose.  Important Safety Information A possible side effect of Eliquis is bleeding. You should call your healthcare provider right away if you experience any of the following: ? Bleeding from an injury or your nose that does not stop. ? Unusual colored urine (red or dark brown) or unusual colored stools (red or black). ? Unusual bruising for unknown reasons. ? A serious fall or if you hit your head (even if there is no bleeding).  Some medicines may interact with Eliquis and might increase your risk of bleeding or clotting while on Eliquis. To help avoid this, consult your healthcare provider or pharmacist prior to using  any new prescription or non-prescription medications, including herbals, vitamins, non-steroidal anti-inflammatory drugs (NSAIDs) and supplements.  This website has more information on Eliquis (apixaban): http://www.eliquis.com/eliquis/home

## 2017-08-08 NOTE — Progress Notes (Signed)
Physical Therapy Assessment and Plan  Patient Details  Name: Brandon [name] MRN: [id number] Date of Birth: [birth year]  PT Diagnosis: Abnormal posture, Abnormality of gait, Coordination disorder, Difficulty walking, Impaired cognition and Muscle weakness Rehab Potential: Good ELOS: 14-16 days   Today's Date: 08/08/2017 PT Individual Time: 1100-1200 PT Individual Time Calculation (min): 60 min    Problem List:  Patient Active Problem List   Diagnosis Date Noted  . Debility 08/07/2017  . Tobacco abuse   . Acute blood loss anemia   . Anemia of chronic disease   . History of CVA (cerebrovascular accident)   . Adjustment disorder with mixed anxiety and depressed mood   . Chronic low back pain   . Dysphagia   . Agitation   . Pressure injury of skin 07/20/2017  . Ventilator dependence ([place])   . Goals of care, counseling/discussion   . Palliative care encounter   . Acute respiratory failure with hypoxemia ([place])   . Acute pulmonary edema (H[facility])   . Acute on chronic diastolic CHF (congestive heart failure) ([place])   . Hypotensive episode   . Arrhythmia 07/13/2017  . Aortic stenosis, moderate 07/13/2017  . Anemia 07/13/2017  . Dyspnea 07/13/2017  . Respiratory distress 07/13/2017  . S/P left rotator cuff repair 05/18/2017  . Non-rheumatic mitral regurgitation   . HOH (hard of hearing) 06/02/2013  . Obesity (BMI 30-39.9) 06/02/2013  . Acute cholecystitis with chronic cholecystitis 06/01/2013  . Calculus of bile duct without mention of cholecystitis or obstruction 05/31/2013  . Nonspecific elevation of levels of transaminase or lactic acid dehydrogenase (LDH) 05/31/2013  . Nonspecific (abnormal) findings on radiological and other examination of biliary tract 05/31/2013  . TOBACCO ABUSE 02/22/2010  . DYSLIPIDEMIA 06/25/2009  . MITRAL INSUFFICIENCY 06/25/2009  . Essential hypertension 06/25/2009  . Aortic valve disorder 06/25/2009  . VALVULAR HEART DISEASE 06/25/2009  .  Cardiovascular disease 06/25/2009  . CEREBROVASCULAR DISEASE 06/25/2009  . CEREBROVASCULAR ACCIDENT WITH RIGHT HEMIPARESIS 06/25/2009  . COPD exacerbation ([place]) 06/25/2009  . FOOT SURGERY, HX OF 06/25/2009  . POLYPECTOMY, HX OF 06/25/2009  . TONSILLECTOMY, HX OF 06/25/2009    Past Medical History:  Past Medical History:  Diagnosis Date  . Anemia 07/13/2017  . Anxiety   . Aortic insufficiency   . Aortic stenosis, moderate 07/13/2017  . Arthritis    back   . Carotid stenosis    Right carotid stent (widely patent) 40 - 59% left plaque 11/13  . Depression   . Dyslipidemia   . GERD (gastroesophageal reflux disease)   . Heart murmur   . Hemiplegia affecting unspecified side, late effect of cerebrovascular disease    resolved- from L side   . Hypertension   . Jaundice    resolved following ERCP & Cholecystectomy  . Mitral valve insufficiency and aortic valve insufficiency   . Pre-diabetes    per spouse  . Sleep apnea    does not wear CPAP  . Sleep concern    resulted in surgery- after + sleep test. Pt. doesn't have a problem any longer.   . Stroke ([place]) 03/11/2003   stent placed on the 31, 3, 2004, L side   . Wears glasses   . Wears hearing aid in both ears   . Wears partial dentures    Past Surgical History:  Past Surgical History:  Procedure Laterality Date  . BACK SURGERY     lumbar back  . CHOLECYSTECTOMY    . ERCP N/A 05/31/2013   Procedure: ENDOSCOPIC  RETROGRADE CHOLANGIOPANCREATOGRAPHY (ERCP);  Surgeon: Ladene Artist, MD;  Location: [facility];  Service: Endoscopy;  Laterality: N/A;  . FOOT SURGERY     right  . LAPAROSCOPIC CHOLECYSTECTOMY SINGLE PORT N/A 06/01/2013   Procedure: LAPAROSCOPIC CHOLECYSTECTOMY SINGLE PORT;  Surgeon: Adin Hector, MD;  Location: [facility];  Service: General;  Laterality: N/A;  . POLYPECTOMY    . SHOULDER ARTHROSCOPY WITH ROTATOR CUFF REPAIR AND SUBACROMIAL DECOMPRESSION Left 05/18/2017  . SHOULDER ARTHROSCOPY WITH ROTATOR CUFF REPAIR  AND SUBACROMIAL DECOMPRESSION Left 05/18/2017   Procedure: SHOULDER ARTHROSCOPY WITH ROTATOR CUFF REPAIR AND SUBACROMIAL DECOMPRESSION;  Surgeon: Tania Ade, MD;  Location: [facility];  Service: Orthopedics;  Laterality: Left;  LEFT SHOULDER ARTHROSCOPY WITH ROTATOR CUFF REPAIR AND SUBACROMIAL DECOMPRESSION  . TEE WITHOUT CARDIOVERSION N/A 05/09/2017   Procedure: TRANSESOPHAGEAL ECHOCARDIOGRAM (TEE);  Surgeon: Skeet Latch, MD;  Location: [facility];  Service: Cardiovascular;  Laterality: N/A;  . TONSILLECTOMY      Assessment & Plan Clinical Impression: Brandon R Clingmanis a 71 y.o.right handed malewith history of CVA with carotid stenting, hypertension, aortic stenosis, tobacco abuse, [name], Chronic back pain and recent rotator cuff surgery 05/18/2017. Presented 07/13/2017 for nonspecific chest pain, diaphoresis elevated troponin 0.12. History taken from chart review and wife. Patient lives with spouse reported to be independent prior to admission. One level home with 1 step to entry. Chest x-ray with interval development of pulmonary edema. Treated with intravenous Heparin, Lasix , Lopressor added for bouts of atrial fibrillation with RVR/troponin 0.12 and heparin was transitioned to eliquis.[name] He did develop some agitation received Xanax as well as Haldol. Echocardiogram with ejection fraction of 58% grade 2 diastolic dysfunction. Patient did require intubation for acute hypoxic respiratory failure 07/14/2017. TEE showed moderate LVH, moderate AI with moderate to severe mitral regurgitation. Venous Dopplers lower extremities negative for DVT. He was extubated 07/25/2017 with course complicated by suspect pneumonia. Antibiotics completed 07/29/2017 as well as completing course of prednisone.. Ongoing monitoring of mental status/Delirium with cranial CT/MRI scan 08/04/2017 showed no acute intracranial abnormalities with old remote right MCA territory infarct. Palliative care was  consulted to establish goals of care.A Cor-Track tube was placed 07/26/2017 for nutritional support andModified barium swallow completed 08/07/2017 placed on a dysphagia #2 nectar thick liquid diet .Physical therapy evaluation completed and ongoing noting debilitation. The clarification of weightbearing status of left upper extremity after rotator cuff surgery Is nonweightbearing left upper extremity except help with standing and using a rolling walker. No pulling or pushing with left upper extremity. Acute on chronic anemia 7.4 and monitored with plan transfusion 1 unit 08/07/2017. Patient passed swallow and advanced to D2 diet.  Pain meds changed from IV fentanyl, which he had been receiving ~5/day. M.D. has requested physical medicine rehabilitation consult. .  Patient transferred to CIR on 08/07/2017 .   Patient currently requires mod with mobility secondary to muscle weakness, decreased cardiorespiratoy endurance, impaired timing and sequencing, decreased coordination and decreased motor planning and decreased sitting balance, decreased standing balance, decreased postural control and decreased balance strategies.  Prior to hospitalization, patient was independent  with mobility and lived with Spouse in a House home.  Home access is 1 + 1Stairs to enter.  Patient will benefit from skilled PT intervention to maximize safe functional mobility, minimize fall risk and decrease caregiver burden for planned discharge home with 24 hour supervision.  Anticipate patient will benefit from follow up [place] at discharge.  PT - End of Session Activity Tolerance: Tolerates 30+ min  activity with multiple rests Endurance Deficit: Yes Endurance Deficit Description: Pt only able to walk 15 ft due to fatigue  PT Assessment Rehab Potential (ACUTE/IP ONLY): Good PT Barriers to Discharge: Behavior (dec cognitive & safety awareness ) PT Patient demonstrates impairments in the following area(s):  Balance;Endurance;Motor;Safety PT Transfers Functional Problem(s): Bed Mobility;Bed to Chair;Car;Furniture PT Locomotion Functional Problem(s): Ambulation;Stairs PT Plan PT Intensity: Minimum of 1-2 x/day ,45 to 90 minutes PT Frequency: 5 out of 7 days PT Duration Estimated Length of Stay: 14-16 days PT Treatment/Interventions: Ambulation/gait training;Community reintegration;DME/adaptive equipment instruction;Neuromuscular re-education;Stair training;UE/LE Strength taining/ROM;Training and development officer;Therapeutic Activities;UE/LE Coordination activities;Functional mobility training;Patient/family education;Therapeutic Exercise PT Transfers Anticipated Outcome(s): Supervision PT Locomotion Anticipated Outcome(s): Supervision PT Recommendation Recommendations for Other Services: Speech consult;Therapeutic Recreation consult Therapeutic Recreation Interventions: Pet therapy;Stress management Follow Up Recommendations: 24 hour supervision/assistance;Home health PT Patient destination: Home Equipment Recommended: Rolling walker with 5" wheels;To be determined  Skilled Therapeutic Intervention Pt received lying in bed, agreeable to therapy, wife present. Wife present for full session. Pt lethargic and required several tactile and verbal cues to stay alert and respond to questions. Pt supine>sit with min assist to intitiated movement and min cues for sequencing. Pt sit>stand with mod assist for weight shifting throughout session. Pt had difficulty following commands throughout session, required several verbal and tactile cues to understand simple commands. Pt did respond better to wife with commands. Pt min assist with stand>sit. Pt bed/chair transfer with mod assist and cues for RW management and sequencing. Pt able to ambulate 15 ft with RW and min assist. Displayed narrow BOS and scissoring. Pt limited by fatigue throughout session. Pt navigated up/down 3, 3" stairs with min assist and 2 rails.  Performed car transfer with min assist. Pt returned to room total assist. Pt left sitting up in wheelchair, wife present, quick release belt on, and all needs in reach.   PT Evaluation Precautions/Restrictions Precautions Precautions: Shoulder;Fall Restrictions Weight Bearing Restrictions: Yes LUE Weight Bearing: Non weight bearing (LUE- RC precautions ) General Chart Reviewed: Yes Vital Signs Pain Pain Assessment Pain Assessment: No/denies pain Home Living/Prior Functioning Home Living Available Help at Discharge: Family;Friend(s);Available 24 hours/day Type of Home: House Home Access: Stairs to enter [employer] of Steps: 1 + 1 Entrance Stairs-Rails: None Home Layout: One level Bathroom Shower/Tub: Product/process development scientist: Standard  Lives With: Spouse Prior Function Comments: Pt was fully independent with ADLs including yard work.  He recently underwent Lt partial repair RTC,per wife (June 2018), and was still undergoing therapy - Dr Tamera Punt  Vision/Perception  Vision - Assessment Eye Alignment: Within Functional Limits Ocular Range of Motion: Within Functional Limits Alignment/Gaze Preference: Within Defined Limits Tracking/Visual Pursuits: Decreased smoothness of vertical tracking Additional Comments: no visual deficits effecting mobility Perception Perception: Within Functional Limits Praxis Praxis: Impaired Praxis Impairment Details: Motor planning  Cognition Orientation Level: Oriented to person;Disoriented to place;Disoriented to situation Attention: Sustained Sustained Attention: Impaired Sustained Attention Impairment: Verbal basic;Functional basic Memory Impairment: Decreased recall of new information;Retrieval deficit;Storage deficit Awareness: Impaired Awareness Impairment: Intellectual impairment Problem Solving: Impaired Safety/Judgment: Impaired Sensation Sensation Light Touch: Appears Intact Stereognosis: Appears  Intact Hot/Cold: Appears Intact Proprioception: Not tested (Pt unable to follow directions for testing) Coordination Gross Motor Movements are Fluid and Coordinated: No (Tested functionally) Fine Motor Movements are Fluid and Coordinated: No Coordination and Movement Description: slow and delayed  Finger Nose Finger Test: R and L 8x in 10 seconds Motor  Motor Motor: Other (comment);Motor apraxia (difficulty initiating movement with commands) Motor - Skilled  Clinical Observations: AROM WFL, overall strength 4-/5  Mobility Bed Mobility Bed Mobility: Rolling Right;Supine to Sit;Sit to Supine Rolling Right: 4: Min assist Rolling Right Details: Tactile cues for initiation;Visual cues/gestures for sequencing Rolling Left: 4: Min assist Rolling Left Details: Tactile cues for initiation;Verbal cues for sequencing Supine to Sit: 4: Min assist;With rails Supine to Sit Details: Tactile cues for initiation;Verbal cues for sequencing Sit to Supine: Not Tested (comment) (Pt stayed up in chair at end of session) Transfers Transfers: Yes Sit to Stand: 3: Mod assist Sit to Stand Details: Manual facilitation for weight shifting;Verbal cues for sequencing;Verbal cues for safe use of DME/AE Stand to Sit: 4: Min assist Stand to Sit Details (indicate cue type and reason): Verbal cues for sequencing;Verbal cues for safe use of DME/AE Stand Pivot Transfers: 3: Mod assist Stand Pivot Transfer Details: Manual facilitation for weight shifting;Verbal cues for sequencing Locomotion  Ambulation Ambulation: Yes Ambulation/Gait Assistance: 4: Min assist Ambulation Distance (Feet): 15 Feet Assistive device: Rolling walker Ambulation/Gait Assistance Details: Verbal cues for safe use of DME/AE;Verbal cues for precautions/safety Gait Gait: Yes Gait Pattern: Narrow base of support;Scissoring Stairs / Additional Locomotion Stairs: Yes Stairs Assistance: 4: Min assist Stairs Assistance Details: Tactile cues for  posture;Verbal cues for sequencing Stair Management Technique: Two rails Number of Stairs: 3 Height of Stairs: 3 Ramp: Not tested (comment) (fatigue) Curb: Not tested (comment) (fatigue ) Wheelchair Mobility Wheelchair Mobility: No  Trunk/Postural Assessment  Cervical Assessment Cervical Assessment: Within Functional Limits Thoracic Assessment Thoracic Assessment:  (mild kyphosis) Lumbar Assessment Lumbar Assessment:  (posterior pelvic tilt) Postural Control Postural Control: Deficits on evaluation Protective Responses: delayed  Balance Balance Balance Assessed: Yes Static Sitting Balance Static Sitting - Level of Assistance: 5: Stand by assistance Dynamic Sitting Balance Dynamic Sitting - Level of Assistance: 5: Stand by assistance Sitting balance - Comments: Pt able to maintain sitting balance during MMT but with some swaying Static Standing Balance Static Standing - Level of Assistance: 4: Min assist (with bilat UE support ) Dynamic Standing Balance Dynamic Standing - Level of Assistance: Not tested (comment) (due to pt fatigue & dec response to commands ) Extremity Assessment  RUE Assessment RUE Assessment: Within Functional Limits LUE Assessment LUE Assessment: Exceptions to [facility] (RC precautions; sx in June 2018) RLE Assessment RLE Assessment: Exceptions to [facility] RLE Strength RLE Overall Strength: Within Functional Limits for tasks assessed;Other (Comment) (Hip flexion 3-/5) LLE Assessment LLE Assessment: Exceptions to [facility] LLE Strength LLE Overall Strength: Within Functional Limits for tasks assessed (hip flexion 3-/5)   See Function Navigator for Current Functional Status.   Refer to Care Plan for Long Term Goals  Recommendations for other services: Neuropsych and Therapeutic Recreation  Pet therapy and Stress management  Discharge Criteria: Patient will be discharged from PT if patient refuses treatment 3 consecutive times without medical reason, if treatment  goals not met, if there is a change in medical status, if patient makes no progress towards goals or if patient is discharged from hospital.  The above assessment, treatment plan, treatment alternatives and goals were discussed and mutually agreed upon: by patient and by family  Gwinda Passe, SPT 08/08/2017, 1:05 PM

## 2017-08-08 NOTE — Progress Notes (Signed)
Initial Nutrition Assessment  DOCUMENTATION CODES:   Obesity unspecified  INTERVENTION:  Provide Glucerna Shake po TID (thicken to appropriate consistency), each supplement provides 220 kcal and 10 grams of protein.  Continue 30 ml Prostat po BID, each supplement provides 100 kcal and 15 grams of protein.   Continue 72 hour calorie count.   NUTRITION DIAGNOSIS:   Inadequate oral intake related to dysphagia as evidenced by meal completion < 50%.  GOAL:   Patient will meet greater than or equal to 90% of their needs  MONITOR:   PO intake, Supplement acceptance, Diet advancement, Labs, Weight trends, Skin, I & O's  REASON FOR ASSESSMENT:   Consult Calorie Count  ASSESSMENT:   71 y.o. right handed male with history of CVA with carotid stenting, hypertension, aortic stenosis, tobacco abuse, [name], Chronic back pain and recent rotator cuff surgery 05/18/2017.  Presented 07/13/2017 for nonspecific chest pain, diaphoresis elevated troponin 0.12. Chest x-ray with interval development of pulmonary edema.  Patient did require intubation for acute hypoxic respiratory failure 07/14/2017. TEE showed moderate LVH, moderate AI with moderate to severe mitral regurgitation.  A Cor-Track tube was placed 07/26/2017 for nutritional support andModified barium swallow completed 08/07/2017 placed on a dysphagia #2 nectar thick liquid diet    Day 1 calorie count result:  Breakfast: 169 kcal, 6 grams of protein Lunch: 333 kcal, 16 grams of protein Dinner:281 kcal, 5 grams of protein Supplements: 360 kcal, 15 grams of protein  Total intake: 1143 kcal (57% of kcal needs) 42 grams of protein (42% of protein needs) ___________________________________________  NGT removed yesterday. Calorie count has been initiated. Meal completion has been 50-60%. Pt with a 11% weight loss in 2 months. Weight loss likely related to fluid status. Pt currently has Prostat ordered. RD to additionally order  Glucerna Shake to aid in adequate nutrition. Wife reports pt has been consuming more liquids rather than solid food at meals. Wife has been encouraging pt PO intake at meals.   Pt with no observed significant fat or muscle mass loss.   Labs and medications reviewed.   Diet Order:  DIET DYS 2 Room service appropriate? Yes; Fluid consistency: Nectar Thick  Skin:  Wound (see comment) (Pressure injury to buttocks)  Last BM:  8/26  Height:   Ht Readings from Last 1 Encounters:  08/07/17 5\' 9"  (1.753 m)    Weight:   Wt Readings from Last 1 Encounters:  08/08/17 223 lb (101.2 kg)    Ideal Body Weight:  72.7 kg  BMI:  Body mass index is 32.93 kg/m.  Estimated Nutritional Needs:   Kcal:  2000-2200  Protein:  100-120 grams  Fluid:  Per MD  EDUCATION NEEDS:   No education needs identified at this time  Corrin Parker, MS, RD, LDN Pager # [phone] After hours/ weekend pager # [phone]

## 2017-08-08 NOTE — Evaluation (Addendum)
Speech Language Pathology Assessment and Plan  Patient Details  Name: Brandon [name] MRN: [id number] Date of Birth: 1946-10-02  SLP Diagnosis: Cognitive Impairments;Voice disorder;Dysphagia  Rehab Potential: Good ELOS: 08/22/2017    Today's Date: 08/08/2017 SLP Individual Time: 1829-9371 SLP Individual Time Calculation (min): 58 min   Problem List:  Patient Active Problem List   Diagnosis Date Noted  . Debility 08/07/2017  . Tobacco abuse   . Acute blood loss anemia   . Anemia of chronic disease   . History of CVA (cerebrovascular accident)   . Adjustment disorder with mixed anxiety and depressed mood   . Chronic low back pain   . Dysphagia   . Agitation   . Pressure injury of skin 07/20/2017  . Ventilator dependence ([place])   . Goals of care, counseling/discussion   . Palliative care encounter   . Acute respiratory failure with hypoxemia ([place])   . Acute pulmonary edema ([facility])   . Acute on chronic diastolic CHF (congestive heart failure) ([place])   . Hypotensive episode   . Arrhythmia 07/13/2017  . Aortic stenosis, moderate 07/13/2017  . Anemia 07/13/2017  . Dyspnea 07/13/2017  . Respiratory distress 07/13/2017  . S/P left rotator cuff repair 05/18/2017  . Non-rheumatic mitral regurgitation   . HOH (hard of hearing) 06/02/2013  . Obesity (BMI 30-39.9) 06/02/2013  . Acute cholecystitis with chronic cholecystitis 06/01/2013  . Calculus of bile duct without mention of cholecystitis or obstruction 05/31/2013  . Nonspecific elevation of levels of transaminase or lactic acid dehydrogenase (LDH) 05/31/2013  . Nonspecific (abnormal) findings on radiological and other examination of biliary tract 05/31/2013  . TOBACCO ABUSE 02/22/2010  . DYSLIPIDEMIA 06/25/2009  . MITRAL INSUFFICIENCY 06/25/2009  . Essential hypertension 06/25/2009  . Aortic valve disorder 06/25/2009  . VALVULAR HEART DISEASE 06/25/2009  . Cardiovascular disease 06/25/2009  . CEREBROVASCULAR DISEASE 06/25/2009   . CEREBROVASCULAR ACCIDENT WITH RIGHT HEMIPARESIS 06/25/2009  . COPD exacerbation ([place]) 06/25/2009  . FOOT SURGERY, HX OF 06/25/2009  . POLYPECTOMY, HX OF 06/25/2009  . TONSILLECTOMY, HX OF 06/25/2009   Past Medical History:  Past Medical History:  Diagnosis Date  . Anemia 07/13/2017  . Anxiety   . Aortic insufficiency   . Aortic stenosis, moderate 07/13/2017  . Arthritis    back   . Carotid stenosis    Right carotid stent (widely patent) 40 - 59% left plaque 11/13  . Depression   . Dyslipidemia   . GERD (gastroesophageal reflux disease)   . Heart murmur   . Hemiplegia affecting unspecified side, late effect of cerebrovascular disease    resolved- from L side   . Hypertension   . Jaundice    resolved following ERCP & Cholecystectomy  . Mitral valve insufficiency and aortic valve insufficiency   . Pre-diabetes    per spouse  . Sleep apnea    does not wear CPAP  . Sleep concern    resulted in surgery- after + sleep test. Pt. doesn't have a problem any longer.   . Stroke ([place]) 03/11/2003   stent placed on the 31, 3, 2004, L side   . Wears glasses   . Wears hearing aid in both ears   . Wears partial dentures    Past Surgical History:  Past Surgical History:  Procedure Laterality Date  . BACK SURGERY     lumbar back  . CHOLECYSTECTOMY    . ERCP N/A 05/31/2013   Procedure: ENDOSCOPIC RETROGRADE CHOLANGIOPANCREATOGRAPHY (ERCP);  Surgeon: Ladene Artist, MD;  Location:  [facility];  Service: Endoscopy;  Laterality: N/A;  . FOOT SURGERY     right  . LAPAROSCOPIC CHOLECYSTECTOMY SINGLE PORT N/A 06/01/2013   Procedure: LAPAROSCOPIC CHOLECYSTECTOMY SINGLE PORT;  Surgeon: Adin Hector, MD;  Location: [facility];  Service: General;  Laterality: N/A;  . POLYPECTOMY    . SHOULDER ARTHROSCOPY WITH ROTATOR CUFF REPAIR AND SUBACROMIAL DECOMPRESSION Left 05/18/2017  . SHOULDER ARTHROSCOPY WITH ROTATOR CUFF REPAIR AND SUBACROMIAL DECOMPRESSION Left 05/18/2017   Procedure: SHOULDER  ARTHROSCOPY WITH ROTATOR CUFF REPAIR AND SUBACROMIAL DECOMPRESSION;  Surgeon: Tania Ade, MD;  Location: [facility];  Service: Orthopedics;  Laterality: Left;  LEFT SHOULDER ARTHROSCOPY WITH ROTATOR CUFF REPAIR AND SUBACROMIAL DECOMPRESSION  . TEE WITHOUT CARDIOVERSION N/A 05/09/2017   Procedure: TRANSESOPHAGEAL ECHOCARDIOGRAM (TEE);  Surgeon: Skeet Latch, MD;  Location: [facility];  Service: Cardiovascular;  Laterality: N/A;  . TONSILLECTOMY      Assessment / Plan / Recommendation Clinical Impression [name] a 71 y.o.right handed malewith history of CVA with carotid stenting, hypertension, aortic stenosis, tobacco abuse, [name], Chronic back pain and recent rotator cuff surgery 05/18/2017. Presented 07/13/2017 for nonspecific chest pain, diaphoresis elevated troponin 0.12. History taken from chart review and wife. Patient lives with spouse reported to be independent prior to admission. One level home with 1 step to entry. Chest x-ray with interval development of pulmonary edema. Treated with intravenous Heparin, Lasix , Lopressor added for bouts of atrial fibrillation with RVR/troponin 0.12 and heparin was transitioned to eliquis.[name] He did develop some agitation received Xanax as well as Haldol. Echocardiogram with ejection fraction of 76% grade 2 diastolic dysfunction. Patient did require intubation for acute hypoxic respiratory failure 07/14/2017. TEE showed moderate LVH, moderate AI with moderate to severe mitral regurgitation. Venous Dopplers lower extremities negative for DVT. He was extubated 07/25/2017 with course complicated by suspect pneumonia. Antibiotics completed 07/29/2017 as well as completing course of prednisone.. Ongoing monitoring of mental status/Delirium with cranial CT/MRI scan 08/04/2017 showed no acute intracranial abnormalities with old remote right MCA territory infarct. Palliative care was consulted to establish goals of care.A Cor-Track tube was  placed 07/26/2017 for nutritional support andModified barium swallow completed 08/07/2017 placed on a dysphagia #2 nectar thick liquid diet .Physical therapy evaluation completed and ongoing noting debilitation. The clarification of weightbearing status of left upper extremity after rotator cuff surgery Is nonweightbearing left upper extremity except help with standing and using a rolling walker. No pulling or pushing with left upper extremity. Acute on chronic anemia 7.4 and monitored with plan transfusion 1 unit 08/07/2017. Patient passed swallow and advanced to D2 diet. Pain meds changed from IV fentanyl, which he had been receiving ~5/day. M.D. has requested physical medicine rehabilitation consult. Patient was admitted for comprehensive rehabilitation program on 08/07/2017.  Pt was evaluated on 08/08/17 to assess cognitive and swallowing abilities. Pt demonstrated maximum impairment in sustained attention, intellectual awareness, problem solving /safety awareness, and self-correction/self-monitoring, which is further evident in formal cognitive -lingustic assessment, MOCA scoring 10/30 ( 26=>), however noted that fatigue and reduced attention exacerbated deficits.Pt presented with moderately decreased speech intelligibility at phrase level with hoarse and low intensity Vocal quality resulting from poor breath support for speech. Pt presented with mild oral and moderate pharyngeal dysphagia. Pt consumed ice chips, thin liquid via cup sips/ straw, nectar thick liquids, pureed textured foods, mechanical soft textured foods and regular textured foods following oral care. Pt demonstrated slight wet vocal quality and delayed throat clear following PO consumption of thin liquids via  cup/straw and mild lingual residue following consumption of solids. Pt demonstrated ability to clear residue with liquid wash. BSE confirmed results from recent MBS on 08/07/17, continuing to recommend current diet of dysphaiga 2 and  nectar thick liquids to reduce risk of penetration/aspiration due to silent nature and given overall fatigue. Pt will benefit from skilled ST services to maximize functional independence and reduce burden of care for discharge home with spouse.     Skilled Therapeutic Interventions          Skilled ST treatment focused on cognition goals. SLP provided verbal and then demonstration cues to highlight awareness of errors made during cognitive- linguistic assessment task. SLP provided verbal and tactile cues to maintain attention.    SLP Assessment  Patient will need skilled Speech Lanaguage Pathology Services during CIR admission    Recommendations  SLP Diet Recommendations: Nectar;Dysphagia 2 (Fine chop) Liquid Administration via: Cup Medication Administration: Crushed with puree Supervision: Patient able to self feed;Staff to assist with self feeding;Full supervision/cueing for compensatory strategies Compensations: Minimize environmental distractions;Slow rate;Small sips/bites;Lingual sweep for clearance of pocketing;Multiple dry swallows after each bite/sip Postural Changes and/or Swallow Maneuvers: Seated upright 90 degrees Oral Care Recommendations: Oral care BID Patient destination: Home Follow up Recommendations: Home Health SLP;Outpatient SLP Equipment Recommended: None recommended by SLP    SLP Frequency 3 to 5 out of 7 days   SLP Duration  SLP Intensity  SLP Treatment/Interventions 08/22/2017  Minumum of 1-2 x/day, 30 to 90 minutes  Cueing hierarchy;Cognitive remediation/compensation;Dysphagia/aspiration precaution training;Functional tasks;Medication managment    Pain Pain Assessment Pain Assessment: No/denies pain  Prior Functioning Cognitive/Linguistic Baseline: Baseline deficits Type of Home: House  Lives With: Spouse Available Help at Discharge: Available 24 hours/day;Family Vocation: Retired  Function:  Eating Eating   Modified Consistency Diet: Yes Eating  Assist Level: Helper checks for pocketed food;Supervision or verbal cues           Cognition Comprehension Comprehension assist level: Understands basic 50 - 74% of the time/ requires cueing 25 - 49% of the time  Expression   Expression assist level: Expresses basic 50 - 74% of the time/requires cueing 25 - 49% of the time. Needs to repeat parts of sentences.  Social Interaction Social Interaction assist level: Interacts appropriately 50 - 74% of the time - May be physically or verbally inappropriate.  Problem Solving Problem solving assist level: Solves basic less than 25% of the time - needs direction nearly all the time or does not effectively solve problems and may need a restraint for safety  Memory Memory assist level: Recognizes or recalls less than 25% of the time/requires cueing greater than 75% of the time   Short Term Goals: Week 1: SLP Short Term Goal 1 (Week 1): Pt will demonstrate sustained attention to functional task for 5 minutes with Mod multimodal cues. SLP Short Term Goal 2 (Week 1): Pt will demonstrate intellectual awareness and identify 2 physical and 2 cognitive deficits given Mod verbal cues.  SLP Short Term Goal 3 (Week 1): Pt will complete basic, familar tasks with Mod verbal and question cues for functional problem solving. SLP Short Term Goal 4 (Week 1): Pt will consume ice chip trials without overt s/s aspiration over 3 consective sessions in order to determine readiness for instrumental study.  SLP Short Term Goal 5 (Week 1): Pt will increase vocal intensity at phrase level to achieve 80% intelligibilty with Min verbal cues.  SLP Short Term Goal 6 (Week 1): Pt will self-monitor and  self-correct verbal and fucntional errors with Max A multimodal cues.   Refer to Care Plan for Long Term Goals  Recommendations for other services: None   Discharge Criteria: Patient will be discharged from SLP if patient refuses treatment 3 consecutive times without medical reason,  if treatment goals not met, if there is a change in medical status, if patient makes no progress towards goals or if patient is discharged from hospital.  The above assessment, treatment plan, treatment alternatives and goals were discussed and mutually agreed upon: by patient and by family  Deionna Marcantonio  [facility] 08/08/2017, 4:42 PM

## 2017-08-09 ENCOUNTER — Telehealth: Payer: Self-pay | Admitting: Internal Medicine

## 2017-08-09 ENCOUNTER — Inpatient Hospital Stay (HOSPITAL_COMMUNITY): Payer: Medicare HMO

## 2017-08-09 ENCOUNTER — Inpatient Hospital Stay (HOSPITAL_COMMUNITY): Payer: Medicare HMO | Admitting: Physical Therapy

## 2017-08-09 ENCOUNTER — Inpatient Hospital Stay (HOSPITAL_COMMUNITY): Payer: Medicare HMO | Admitting: Speech Pathology

## 2017-08-09 DIAGNOSIS — F5102 Adjustment insomnia: Secondary | ICD-10-CM

## 2017-08-09 LAB — GLUCOSE, CAPILLARY
GLUCOSE-CAPILLARY: 125 mg/dL — AB (ref 65–99)
Glucose-Capillary: 85 mg/dL (ref 65–99)
Glucose-Capillary: 106 mg/dL — ABNORMAL HIGH (ref 65–99)
Glucose-Capillary: 100 mg/dL — ABNORMAL HIGH (ref 65–99)

## 2017-08-09 MED ORDER — MEGESTROL ACETATE 400 MG/10ML PO SUSP
400.0000 mg | Freq: Every day | ORAL | Status: DC
  Administered 2017-08-09 – 2017-08-10 (×2): 400 mg via ORAL
  Filled 2017-08-09 (×3): qty 10

## 2017-08-09 MED ORDER — ENSURE ENLIVE PO LIQD
237.0000 mL | Freq: Three times a day (TID) | ORAL | Status: DC
  Administered 2017-08-09 – 2017-08-16 (×17): 237 mL via ORAL

## 2017-08-09 MED ORDER — ENSURE ENLIVE PO LIQD: 237.0000 mL | Freq: Two times a day (BID) | ORAL | Status: DC

## 2017-08-09 MED ORDER — TRAZODONE HCL 50 MG PO TABS
50.0000 mg | ORAL_TABLET | Freq: Every day | ORAL | Status: DC
  Administered 2017-08-09 – 2017-08-21 (×13): 50 mg via ORAL
  Filled 2017-08-09 (×13): qty 1

## 2017-08-09 NOTE — Progress Notes (Signed)
Occupational Therapy Note  Patient Details  Name: Brandon [name] MRN: [id number] Date of Birth: 11-Sep-1946  Today's Date: 08/09/2017 OT Individual Time: 1400-1430 OT Individual Time Calculation (min): 30 min   Pt denies pain Individual Therapy  Pt asleep in bed upon arrival with wife present.  Pt required max multimodal cues for arousal.  Pt slow to respond to commands but followed all one step commands with delay.  Pt performed supine>sit EOB with min A and SPT to w/c with min A.  Pt required max multimodal cues to adhere to LUE precautions.  Focus on activity tolerance, bed mobility, functional transfers, and safety awareness to increase independence with BADLs.    [name] [facility] 08/09/2017, 2:46 PM

## 2017-08-09 NOTE — Progress Notes (Signed)
[facility] PHYSICAL MEDICINE & REHABILITATION     PROGRESS NOTE    Subjective/Complaints: Had a much better night. Rested the best he has since being in hospital per wife. Still not eating much. Hasn't had a bm since being here  ROS: pt denies nausea, vomiting, diarrhea, cough, shortness of breath or chest pain   Objective: Vital Signs: Blood pressure (!) 144/120, pulse 75, temperature 97.8 F (36.6 C), temperature source Oral, resp. rate 18, height 5\' 9"  (1.753 m), weight 99.3 kg (219 lb), SpO2 100 %. No results found.  Recent Labs  08/07/17 0506 08/08/17 0342  WBC 13.2* 11.1*  HGB 7.4* 8.5*  HCT 24.8* 27.8*  PLT 373 380    Recent Labs  08/07/17 0506 08/08/17 0342  NA 139 139  K 4.0 4.2  CL 106 105  GLUCOSE 130* 105*  BUN 49* 46*  CREATININE 1.04 1.14  CALCIUM 8.5* 8.6*   CBG (last 3)   Recent Labs  08/08/17 1641 08/08/17 2122 08/09/17 0652  GLUCAP 104* 104* 85    Wt Readings from Last 3 Encounters:  08/09/17 99.3 kg (219 lb)  08/07/17 101.9 kg (224 lb 9.6 oz)  05/18/17 114.3 kg (252 lb)    Physical Exam:   Constitutional: He appears well-developed. No distress Obese  HENT:  Oral mucosa pink and moist  Eyes: Right eye exhibits no discharge. Left eye exhibits no discharge.  Pupils reactive to light  Neck: Normal range of motion. Neck supple. No thyromegaly present.  Cardiovascular: RRR without murmur. No JVD    Respiratory:  Fair air movements. decreasd BS at bases GI: NT/ND.  Musculoskeletal: He exhibits peripheral tr edema. He exhibits no tenderness.  Neurological: He is alert.  A&Ox1 to person. Follows simple commands. More alert. Speech less dysarthric. Remains HOH. Motor: RUE: 3 to 3+/5 right deltoid, bicep, tricep, hand grip Left upper extremity has at least 3+/5 strength in the deltoid, bicep, tricep, hand grip Lower extremity strength is 3+/5 at the hip flexors, 4- knee extensors, ankle dorsiflexors.---motor exam essenstially stable  Senses pain in all 4's.     Skin: Skin is warm and dry.  Psychiatric: pleasant and cooperative   Assessment/Plan: 1. Functional deficits secondary to debility/prior CVA which require 3+ hours per day of interdisciplinary therapy in a comprehensive inpatient rehab setting. Physiatrist is providing close team supervision and 24 hour management of active medical problems listed below. Physiatrist and rehab team continue to assess barriers to discharge/monitor patient progress toward functional and medical goals.  Function:  Bathing Bathing position   Position: Wheelchair/chair at sink  Bathing parts Body parts bathed by patient: Right arm, Left arm, Chest, Abdomen (with mod cues ) Body parts bathed by helper: Right lower leg, Left lower leg, Right upper leg, Left upper leg, Back, Buttocks, Front perineal area  Bathing assist        Upper Body Dressing/Undressing Upper body dressing   What is the patient wearing?: Pull over shirt/dress     Pull over shirt/dress - Perfomed by patient: Thread/unthread right sleeve, Thread/unthread left sleeve, Put head through opening Pull over shirt/dress - Perfomed by helper: Pull shirt over trunk        Upper body assist Assist Level: Supervision or verbal cues      Lower Body Dressing/Undressing Lower body dressing   What is the patient wearing?: Pants, Ted Hose, Non-skid slipper socks       Pants- Performed by helper: Thread/unthread right pants leg, Thread/unthread left pants leg, Pull pants  up/down   Non-skid slipper socks- Performed by helper: Don/doff right sock, Don/doff left sock               TED Hose - Performed by helper: Don/doff right TED hose, Don/doff left TED hose  Lower body assist        Toileting Toileting          Toileting assist     Transfers Chair/bed transfer   Chair/bed transfer method: Stand pivot Chair/bed transfer assist level: Moderate assist (Pt 50 - 74%/lift or lower) Chair/bed transfer  assistive device: Medical sales representative     Max distance: 15 Assist level: Touching or steadying assistance (Pt > 75%)   Wheelchair          Cognition Comprehension Comprehension assist level: Understands basic 50 - 74% of the time/ requires cueing 25 - 49% of the time  Expression Expression assist level: Expresses basic 50 - 74% of the time/requires cueing 25 - 49% of the time. Needs to repeat parts of sentences.  Social Interaction Social Interaction assist level: Interacts appropriately 50 - 74% of the time - May be physically or verbally inappropriate.  Problem Solving Problem solving assist level: Solves basic less than 25% of the time - needs direction nearly all the time or does not effectively solve problems and may need a restraint for safety  Memory Memory assist level: Recognizes or recalls less than 25% of the time/requires cueing greater than 75% of the time    Medical Problem List and Plan: 1.  Debilitation and right upper extremity weakness due to history of CVA/acute hypoxemic respiratory failure. Patient was extubated 07/25/2017  -continue therapies 2.  DVT Prophylaxis/Anticoagulation: Eliquis. Monitor for any increased bleeding episodes 3. Pain Management/chronic back pain: Tylenol as needed. Patient on oxycodone 5-325mg  1 to 2 tabs every 4 hours as needed prior to admission. Resume as needed  -would like to limit given ongoing encephalopathy 4. Mood/delirium: Provide emotional support. Patient on Wellbutrin 150 mg twice a day as well as Celexa 20 mg daily prior to admission.   -resumed celexa 10mg  QHS  -sleep chart, slept well last night  -trazodone prn effective---will schedule tonight 5. Neuropsych: This patient is capable of making decisions on his own behalf. 6. Skin/Wound Care: Routine skin checks 7. Fluids/Electrolytes/Nutrition: encourage fluids  -continue to encourage fluids 8. Acute on chronic diastolic congestive heart failure. Monitor for  any signs of fluid overload.   Filed Weights   08/07/17 1600 08/08/17 0535  Weight: 103.4 kg (228 lb) 101.2 kg (223 lb)    - Follow-up per cardiology services. Lasix 40 mg daily 9. Atrial fibrillation. Continue Eliquis. Amiodarone 200 mg twice a day 10. Dysphagia. Dysphagia #2 nectar liquids after MBS 08/07/2017.   -protein supp  -encourage fluids  -add daily megace to boost appetite 11. History of CVA. Follow-up per neurology services and advised to continue eliquis. 12. Recent left rotator cuff surgery. Per Dr. Tamera Punt nonweightbearing left upper extremity except to assist with standing and able to use rolling walker. No pulling or pushing with left upper extremity. Unrestricted passive range of motion active range of motion. No strengthening restrictions 13. Acute on chronic anemia. Latest transfusion 08/07/2017. Follow-up hgb 8.5 today  -follow serially  -no signs of blood loss 14. COPD/tobacco abuse. Counseling. Check oxygen saturations every shift 15. CKD   LOS (Days) 2 A FACE TO FACE EVALUATION WAS PERFORMED  Meredith Staggers, MD 08/09/2017 8:42 AM

## 2017-08-09 NOTE — Telephone Encounter (Signed)
Left message to call back DPR on file to speak with wife.

## 2017-08-09 NOTE — Progress Notes (Addendum)
Day 2 Calorie Count Note  72 hour calorie count ordered.  Diet: Dysphagia 2 with nectar thick liquids Supplements:   Glucerna Shake po TID, each supplement provides 220 kcal and 10 grams of protein  30 ml Prostat po TID, each supplement provides 100 kcal and 15 grams of protein.   Breakfast: 71 kcal, 1 gram of protein Lunch: 347 kcal, 9 grams of protein Dinner: 393 kcal, 12 grams of protein Supplements: 760 kcal, 45 grams of protein  Day 2 Total intake: 1571 kcal (79% of minimum estimated needs)  67 grams of protein (67% of minimum estimated needs)  Estimated Nutritional Needs:  Kcal:  2000-2200 Protein:  100-120 grams Fluid:  Per MD  Nutrition Dx:  Inadequate oral intake related to dysphagia as evidenced by meal completion < 50%; ongoing  Goal:  Pt to meet >/= 90% of their estimated nutrition needs; progressing  Intervention:   Provide Ensure Enlive po TID (thicken as appropriate), each supplement provides 350 kcal and 20 grams of protein  Continue 30 ml Prostat po TID, each supplement provides 100 kcal and 15 grams of protein.   RD to follow up tomorrow with final day 3 calorie count result.  Corrin Parker, MS, RD, LDN Pager # [phone] After hours/ weekend pager # [phone]

## 2017-08-09 NOTE — Progress Notes (Signed)
Speech Language Pathology Daily Session Note  Patient Details  Name: Brandon [name] MRN: [id number] Date of Birth: 1946/12/11  Today's Date: 08/09/2017 SLP Individual Time: 0905-1000 SLP Individual Time Calculation (min): 55 min  Short Term Goals: Week 1: SLP Short Term Goal 1 (Week 1): Pt will demonstrate sustained attention to functional task for 5 minutes with Mod multimodal cues. SLP Short Term Goal 2 (Week 1): Pt will demonstrate intellectual awareness and identify 2 physical and 2 cognitive deficits given Mod verbal cues.  SLP Short Term Goal 3 (Week 1): Pt will complete basic, familar tasks with Mod verbal and question cues for functional problem solving. SLP Short Term Goal 4 (Week 1): Pt will consume ice chip trials without overt s/s aspiration over 3 consective sessions in order to determine readiness for instrumental study.  SLP Short Term Goal 5 (Week 1): Pt will increase vocal intensity at phrase level to achieve 80% intelligibilty with Mod verbal cues.  SLP Short Term Goal 6 (Week 1): Pt will self-monitor and  self-correct verbal and fucntional errors with Max A multimodal cues.   Skilled Therapeutic Interventions:  Pt was seen for skilled ST targeting cognitive-linguistic goals.  SLP completed skilled observations during repetitions with EMST trainer as part of ongoing diagnostic treatment.  Pt was able to complete 5 sets of 5 repetitions with device set at 8 cm H2O with a self perceived effort level of 6/10.  Pt needed max assist multimodal cues for accurate return demonstration of device due to fatigue and decreased attention to tasks.  SLP reviewed and reinforced rationale for EMST and parameters for safe device use with pt and his wife.  All questions were answered to their satisfaction at this time. PT was unable identify current impairment or why he was at the hospital, SLP provided reeducation of current impairments to foster intellectual awareness providing demonstration of  cognitive deficits and questions cues for physical deficits. SLP facilitated color matching task to address basic problem solving and attention, pt demonstrated mod ability to match when attention was marinated, however required max verbal and tactile cues to maintain attention. Pt demonstrated ability to arrange numbers in order 1-4 , however given 1-6 was unable to arrange, and recognize errors given max verbal, visual and demonstration cues Overall pt's lack of ability to maintain attention impacted ability to problem solve and self-correct. SLP educated wife on creating nectar thick liquid with coffee and wife returned demonstration.  PT required max verbal, visual and demonstration cues to follow swallow strategies ( double swallow) on current diet to reduce risk of aspiration. Pt was left in chair with wife in room. Recommend to continue ST services.  Function:  Eating Eating   Modified Consistency Diet: Yes Eating Assist Level: Helper checks for pocketed food;Supervision or verbal cues           Cognition Comprehension Comprehension assist level: Understands basic 50 - 74% of the time/ requires cueing 25 - 49% of the time  Expression   Expression assist level: Expresses basic 50 - 74% of the time/requires cueing 25 - 49% of the time. Needs to repeat parts of sentences.  Social Interaction Social Interaction assist level: Interacts appropriately 50 - 74% of the time - May be physically or verbally inappropriate.  Problem Solving Problem solving assist level: Solves basic less than 25% of the time - needs direction nearly all the time or does not effectively solve problems and may need a restraint for safety  Memory Memory assist level: Recognizes  or recalls less than 25% of the time/requires cueing greater than 75% of the time    Pain Pain Assessment Pain Assessment: No/denies pain  Therapy/Group: Individual Therapy  Thayne Cindric  [facility] 08/09/2017, 12:42 PM

## 2017-08-09 NOTE — Progress Notes (Signed)
Social Work Assessment and Plan  Patient Details  Name: Brandon [name] MRN: [id number] Date of Birth: 06/21/1946  Today's Date: 08/08/2017  Problem List:  Patient Active Problem List   Diagnosis Date Noted  . Debility 08/07/2017  . Tobacco abuse   . Acute blood loss anemia   . Anemia of chronic disease   . History of CVA (cerebrovascular accident)   . Adjustment disorder with mixed anxiety and depressed mood   . Chronic low back pain   . Dysphagia   . Agitation   . Pressure injury of skin 07/20/2017  . Ventilator dependence (HCC)   . Goals of care, counseling/discussion   . Palliative care encounter   . Acute respiratory failure with hypoxemia (HCC)   . Acute pulmonary edema (HCC)   . Acute on chronic diastolic CHF (congestive heart failure) (HCC)   . Hypotensive episode   . Arrhythmia 07/13/2017  . Aortic stenosis, moderate 07/13/2017  . Anemia 07/13/2017  . Dyspnea 07/13/2017  . Respiratory distress 07/13/2017  . S/P left rotator cuff repair 05/18/2017  . Non-rheumatic mitral regurgitation   . HOH (hard of hearing) 06/02/2013  . Obesity (BMI 30-39.9) 06/02/2013  . Acute cholecystitis with chronic cholecystitis 06/01/2013  . Calculus of bile duct without mention of cholecystitis or obstruction 05/31/2013  . Nonspecific elevation of levels of transaminase or lactic acid dehydrogenase (LDH) 05/31/2013  . Nonspecific (abnormal) findings on radiological and other examination of biliary tract 05/31/2013  . TOBACCO ABUSE 02/22/2010  . DYSLIPIDEMIA 06/25/2009  . MITRAL INSUFFICIENCY 06/25/2009  . Essential hypertension 06/25/2009  . Aortic valve disorder 06/25/2009  . VALVULAR HEART DISEASE 06/25/2009  . Cardiovascular disease 06/25/2009  . CEREBROVASCULAR DISEASE 06/25/2009  . CEREBROVASCULAR ACCIDENT WITH RIGHT HEMIPARESIS 06/25/2009  . COPD exacerbation (HCC) 06/25/2009  . FOOT SURGERY, HX OF 06/25/2009  . POLYPECTOMY, HX OF 06/25/2009  . TONSILLECTOMY, HX OF  06/25/2009   Past Medical History:  Past Medical History:  Diagnosis Date  . Anemia 07/13/2017  . Anxiety   . Aortic insufficiency   . Aortic stenosis, moderate 07/13/2017  . Arthritis    back   . Carotid stenosis    Right carotid stent (widely patent) 40 - 59% left plaque 11/13  . Depression   . Dyslipidemia   . GERD (gastroesophageal reflux disease)   . Heart murmur   . Hemiplegia affecting unspecified side, late effect of cerebrovascular disease    resolved- from L side   . Hypertension   . Jaundice    resolved following ERCP & Cholecystectomy  . Mitral valve insufficiency and aortic valve insufficiency   . Pre-diabetes    per spouse  . Sleep apnea    does not wear CPAP  . Sleep concern    resulted in surgery- after + sleep test. Pt. doesn't have a problem any longer.   . Stroke (HCC) 03/11/2003   stent placed on the 31, 3, 2004, L side   . Wears glasses   . Wears hearing aid in both ears   . Wears partial dentures    Past Surgical History:  Past Surgical History:  Procedure Laterality Date  . BACK SURGERY     lumbar back  . CHOLECYSTECTOMY    . ERCP N/A 05/31/2013   Procedure: ENDOSCOPIC RETROGRADE CHOLANGIOPANCREATOGRAPHY (ERCP);  Surgeon: Malcolm T Stark, MD;  Location: [facility];  Service: Endoscopy;  Laterality: N/A;  . FOOT SURGERY     right  . LAPAROSCOPIC CHOLECYSTECTOMY SINGLE PORT N/A 06/01/2013     Procedure: LAPAROSCOPIC CHOLECYSTECTOMY SINGLE PORT;  Surgeon: Steven C. Gross, MD;  Location: [facility];  Service: General;  Laterality: N/A;  . POLYPECTOMY    . SHOULDER ARTHROSCOPY WITH ROTATOR CUFF REPAIR AND SUBACROMIAL DECOMPRESSION Left 05/18/2017  . SHOULDER ARTHROSCOPY WITH ROTATOR CUFF REPAIR AND SUBACROMIAL DECOMPRESSION Left 05/18/2017   Procedure: SHOULDER ARTHROSCOPY WITH ROTATOR CUFF REPAIR AND SUBACROMIAL DECOMPRESSION;  Surgeon: Chandler, Justin, MD;  Location: [facility];  Service: Orthopedics;  Laterality: Left;  LEFT SHOULDER ARTHROSCOPY WITH ROTATOR  CUFF REPAIR AND SUBACROMIAL DECOMPRESSION  . TEE WITHOUT CARDIOVERSION N/A 05/09/2017   Procedure: TRANSESOPHAGEAL ECHOCARDIOGRAM (TEE);  Surgeon: Wagner, Tiffany, MD;  Location: [facility];  Service: Cardiovascular;  Laterality: N/A;  . TONSILLECTOMY     Social History:  reports that he has been smoking Cigarettes.  He has a 28.50 pack-year smoking history. He has never used smokeless tobacco. He reports that he drinks about 1.2 - 1.8 oz of alcohol per week . He reports that he does not use drugs.  Family / Support Systems Marital Status: Married How Long?: 33 years Patient Roles: Spouse Spouse/Significant Other: Sandra Auzenne - wife - [phone] (h); [phone] (m) Children: 2 sons Other Supports: neighbor Anticipated Caregiver: Spouse Ability/Limitations of Caregiver: None Caregiver Availability: 24/7 Family Dynamics: close, supportive wife and one son is very helpful  Social History Preferred language: English Religion: Baptist Read: Yes Write: Yes Employment Status: Retired Date Retired/Disabled/Unemployed: 2004 Age Retired: 57 Legal History/Current Legal Issues: none reported Guardian/Conservator: N/A - MD has determined that pt is capable of making his own decisions.   Abuse/Neglect Physical Abuse: Denies Verbal Abuse: Denies Sexual Abuse: Denies Exploitation of patient/patient's resources: Denies Self-Neglect: Denies  Emotional Status Pt's affect, behavior and adjustment status: Pt was restless and tired during visit, but was still joking around with CSW and wife.  Wife reports this is his normal personality and she feels he is coping well with hospitalization and change in condition. Recent Psychosocial Issues: none reported Psychiatric History: hx of depression, but managed well by medication Substance Abuse History: none reported  Patient / Family Perceptions, Expectations & Goals Pt/Family understanding of illness & functional limitations: Wife  feels she has a good understanding of pt's condition and how things work on CIR.  She is pleased that he could come to CIR and is hoping for good results for pt. Premorbid pt/family roles/activities: Pt enjoyed doing yard work, weekly breakfast with friends, monthly lunches,  and spending time with family.  Was driving. Anticipated changes in roles/activities/participation: Pt/wife hope that pt can resume activities as he is able. Pt/family expectations/goals: Pt's wife hopes that pt can regain as much independence as possible and only require supervision.  Community Resources Community Agencies: None Premorbid Home Care/DME Agencies: Other (Comment) (Pt has [facility] after his stroke in 2004.  Would like to use them again.) Transportation available at discharge: wife Resource referrals recommended: Neuropsychology  Discharge Planning Living Arrangements: Spouse/significant other Support Systems: Spouse/significant other, Children, Other relatives, Friends/neighbors Type of Residence: Private residence Insurance Resources: Private Insurance (specify) ([employer] Medicare and AARP) Financial Resources: Social Security Financial Screen Referred: No Money Management: Patient, Spouse Does the patient have any problems obtaining your medications?: No Home Management: Pt was caring for outside of home, but family and neighbors are helping with this.  Wife was doing inside chores. Patient/Family Preliminary Plans: Pt plans to return to his home with his wife to proivde 24/7 supervision. Social Work Anticipated Follow Up Needs: HH/OP Expected length of   stay: 2 weeks  Clinical Impression CSW met with pt and his wife to introduce self and role of CSW, as well as to complete assessment.  Pt was tired during visit, but tried to talk with CSW and was joking with CSW and wife.  Wife was appreciative of pt's opportunity to be on CIR.  Pt was independent PTA and wife will be pleased if he reaches  supervision level.  No current concern/questions/needs at this time.  CSW will continue to follow and assist as needed.  ,  Capps 08/09/2017, 10:21 AM   

## 2017-08-09 NOTE — Telephone Encounter (Signed)
Spoke with wife and she states pt is currently at [facility] until estimated date of 08/22/17.  She states Dr. Aundra Dubin had mentioned he would need to be seen soon after he is d/c'ed from rehab.  Advised wife to keep 9/20 appt and call when it is closer to time if pt needed to move appt for whatever reason.  Wife verbalized understanding and was appreciative for call.

## 2017-08-09 NOTE — Progress Notes (Signed)
Physical Therapy Session Note  Patient Details  Name: Brandon [name] MRN: [id number] Date of Birth: 1946-04-16  Today's Date: 08/09/2017 PT Individual Time: 1430-1530 PT Individual Time Calculation (min): 60 min   Short Term Goals: Week 1:  PT Short Term Goal 1 (Week 1): Pt will perform sit to stand with min assist and min cues  PT Short Term Goal 2 (Week 1): Pt will navigate up/down 8, 3" stairs with min assist PT Short Term Goal 3 (Week 1): Pt will ambulate 80 ft with RW and min assist PT Short Term Goal 4 (Week 1): Pt will supine<>sit with supervision and min cues   Skilled Therapeutic Interventions/Progress Updates:    Pt received sitting up in wheelchair, agreeable to therapy. Pt sit<>stand with min assist and cues for hand placement throughout session.  Pt ambulated 50 ft with RW and min assist, demonstrated narrow BOS and scissoring. Pt demonstrated difficulty with turns, wide slow turn. Pt performed static standing balance while stacking cups to promote less UE support. Pt standing tolerance limited by fatigue, able to stand ~1 min before needing to sit. Pt performed 3 sets, 20 reps of seated hip abduction, hip adduction, LAQ with 4 lb weights and 3 sets of 10 seated marches. Pt able to follow all commands today. Pt returned to room total assist. Left sitting up in wheelchair, quick release belt on, all needs in reach, wife present.   Therapy Documentation Precautions:  Precautions Precautions: Shoulder, Fall Precaution Comments: Per Dr Tamera Punt (pt s/p Lt RCR early June) NWB Lt UE except to assist with standing and able to use RW; No pulling or pushing with Lt UE;  unrestricted PROM/AROM is okay.  No strengthening   Restrictions Weight Bearing Restrictions: Yes LUE Weight Bearing: Non weight bearing (rotator cuff precautions)   See Function Navigator for Current Functional Status.   Therapy/Group: Individual Therapy  Gwinda Passe, SPT 08/09/2017, 4:12 PM

## 2017-08-09 NOTE — Progress Notes (Signed)
Social Work Patient ID: COYT GOVONI, male   DOB: 1946/08/21, 71 y.o.   MRN: [id number]   CSW met with pt and his wife to update them on team conference discussion and targeted d/c date of 08-22-17 with supervision level goals; min A for cognition.  They were pleased with this.  CSW to continue to follow and assist as needed.

## 2017-08-09 NOTE — Progress Notes (Signed)
Occupational Therapy Session Note  Patient Details  Name: Brandon [name] MRN: [id number] Date of Birth: 08-03-1946  Today's Date: 08/09/2017 OT Individual Time: 1030-1130 OT Individual Time Calculation (min): 60 min    Short Term Goals: Week 1:  OT Short Term Goal 1 (Week 1): Pt will be able to transfer on and off toilet with min A. OT Short Term Goal 2 (Week 1): Pt will bathe self with min A. OT Short Term Goal 3 (Week 1): Pt will don shirt with supervision. OT Short Term Goal 4 (Week 1): Pt will don pants with min A. OT Short Term Goal 5 (Week 1): Pt will demonstrate improved attention to ADL task for at least 5 minutes at a time.   Skilled Therapeutic Interventions/Progress Updates:    Pt resting in w/c upon arrival with wife present.  Pt's wife stated that she and NT assisted with "cleaning up" earlier in the morning and pt's clothes were clean.  Pt's wife observed all therapies this morning and provided info on home/bathroom setup.  Pt practiced tub bench transfers with min A and bed mobility on mat set to approx height of home bed. Pt performed sit<>supine on mat with min A.  Pt amb with RW approx 5' to enter bathroom to simulate home environment.  Pt returned to room and remained in w/c with all needs within reach.   Therapy Documentation Precautions:  Precautions Precautions: Shoulder, Fall Precaution Comments: Per Dr Tamera Punt (pt s/p Lt RCR early June) NWB Lt UE except to assist with standing and able to use RW; No pulling or pushing with Lt UE;  unrestricted PROM/AROM is okay.  No strengthening   Restrictions Weight Bearing Restrictions: Yes LUE Weight Bearing: Non weight bearing (rotator cuff precautions)  Pain: Pain Assessment Pain Assessment: No/denies pain  See Function Navigator for Current Functional Status.   Therapy/Group: Individual Therapy  Leroy Libman 08/09/2017, 11:33 AM

## 2017-08-09 NOTE — Telephone Encounter (Signed)
New message   Pt wife is wanting to know if the September 20th appt with Dr. Harrington Challenger is needed. He is still in the hospital per wife. She requests a call back.

## 2017-08-09 NOTE — Patient Care Conference (Signed)
Inpatient RehabilitationTeam Conference and Plan of Care Update Date: 08/08/2017   Time: 2:00 PM    Patient Name: Brandon [name]      Medical Record Number: [id number]  Date of Birth: September 24, 1946 Sex: Male         Room/Bed: 4W06C/4W06C-01 Payor Info: Payor: [employer] MEDICARE / Plan: [employer] MEDICARE HMO / Product Type: *No Product type* /    Admitting Diagnosis: Aortic stenosis  Admit Date/Time:  08/07/2017  3:58 PM Admission Comments: No comment available   Primary Diagnosis:  <principal problem not specified> Principal Problem: <principal problem not specified>  Patient Active Problem List   Diagnosis Date Noted  . Debility 08/07/2017  . Tobacco abuse   . Acute blood loss anemia   . Anemia of chronic disease   . History of CVA (cerebrovascular accident)   . Adjustment disorder with mixed anxiety and depressed mood   . Chronic low back pain   . Dysphagia   . Agitation   . Pressure injury of skin 07/20/2017  . Ventilator dependence ([place])   . Goals of care, counseling/discussion   . Palliative care encounter   . Acute respiratory failure with hypoxemia ([place])   . Acute pulmonary edema (HCC)   . Acute on chronic diastolic CHF (congestive heart failure) ([place])   . Hypotensive episode   . Arrhythmia 07/13/2017  . Aortic stenosis, moderate 07/13/2017  . Anemia 07/13/2017  . Dyspnea 07/13/2017  . Respiratory distress 07/13/2017  . S/P left rotator cuff repair 05/18/2017  . Non-rheumatic mitral regurgitation   . HOH (hard of hearing) 06/02/2013  . Obesity (BMI 30-39.9) 06/02/2013  . Acute cholecystitis with chronic cholecystitis 06/01/2013  . Calculus of bile duct without mention of cholecystitis or obstruction 05/31/2013  . Nonspecific elevation of levels of transaminase or lactic acid dehydrogenase (LDH) 05/31/2013  . Nonspecific (abnormal) findings on radiological and other examination of biliary tract 05/31/2013  . TOBACCO ABUSE 02/22/2010  . DYSLIPIDEMIA 06/25/2009  . MITRAL  INSUFFICIENCY 06/25/2009  . Essential hypertension 06/25/2009  . Aortic valve disorder 06/25/2009  . VALVULAR HEART DISEASE 06/25/2009  . Cardiovascular disease 06/25/2009  . CEREBROVASCULAR DISEASE 06/25/2009  . CEREBROVASCULAR ACCIDENT WITH RIGHT HEMIPARESIS 06/25/2009  . COPD exacerbation ([place]) 06/25/2009  . FOOT SURGERY, HX OF 06/25/2009  . POLYPECTOMY, HX OF 06/25/2009  . TONSILLECTOMY, HX OF 06/25/2009    Expected Discharge Date: Expected Discharge Date: 08/22/17  Team Members Present: Physician leading conference: Dr. Alger Simons Social Worker Present: Alfonse Alpers, LCSW Nurse Present: Other (comment) Felizardo Hoffmann Troxler, RN) PT Present: Kem Parkinson, PT OT Present: Willeen Cass, OT;Roanna Epley, COTA SLP Present: Charolett Bumpers, SLP PPS Coordinator present : Daiva Nakayama, RN, [facility]     Current Status/Progress Goal Weekly Team Focus  Medical   admitted with debility after respiratory failure prolonged hospital stay. dysphagia--on D2/NG out. encephalopathic  improve sleep-wake  sleep, nutrition, bp/volume mgt   Bowel/Bladder   incontinent bowel and bladder   Continent of blowel and bladder  tolieting schedule   Swallow/Nutrition/ Hydration   mod   min- supervision  swallow strategies and ice chip trials   ADL's   mod A overall  Supervision overall  ADL retraining, functional mobility/ balance, cognitive training, pt/family educaton   Mobility   minA bed mobility, modA sit <>stand and stand pivot transfers, minA gait x10' with RW and three 3" steps; cognitive impairments with slow problem solving, decreased awareness, apraxia  S overall with RW  activity tolerance, LE strengthening, transfer/gait training, dynamic standing balance  Communication   mod  min  RMT, breath support and intelligibility strategies   Safety/Cognition/ Behavioral Observations  Mod-Max  Supervision  Attention, self-correction, problem solving/safety, Recall, HOH- Auditory Comp?    Pain   pain 4-8  Pain free or less than 3  Manage pain with medication or nonpharmacological methods   Skin   skin breakdown and and weeping of skin  no further skin breakdown  Keep dressing clean and dry    Rehab Goals Patient on target to meet rehab goals: Yes Rehab Goals Revised: none - first conference on day of eval *See Care Plan and progress notes for long and short-term goals.     Barriers to Discharge  Current Status/Progress Possible Resolutions Date Resolved   Physician    Medical stability        ongoing medical mgt of numerous issues      Nursing  Wound Care;New diabetic;Nutrition means               PT  Behavior (dec cognitive & safety awareness )                 OT                  SLP Behavior;Medical stability              SW                Discharge Planning/Teaching Needs:  Pt to return to his home with his wife to provide supervision.  Pt's wife to participate in family education closer to d/c.   Team Discussion:  Pt with debility following cardiac arrest and respiratory failure.  Pt is now on D2 diet with nectar thick liquids.  Dr. Naaman Plummer feels pt will clear with better sleep.  Pt with chronic back pain, but not c/o this with RN.  Pt's wife is very involved.  Pt is mod A to stand, but then min A once up.  Mod A with ADLs and was lethargic, but still participated well.  SLP working on problem solving, safety, attention.  Pt is HOH, but can follow directions.  Mod to max A currently for cognitive tasks.  Revisions to Treatment Plan:  none    Continued Need for Acute Rehabilitation Level of Care: The patient requires daily medical management by a physician with specialized training in physical medicine and rehabilitation for the following conditions: Daily direction of a multidisciplinary physical rehabilitation program to ensure safe treatment while eliciting the highest outcome that is of practical value to the patient.: Yes Daily medical management of  patient stability for increased activity during participation in an intensive rehabilitation regime.: Yes Daily analysis of laboratory values and/or radiology reports with any subsequent need for medication adjustment of medical intervention for : Pulmonary problems;Cardiac problems;Neurological problems;Nutritional problems  Aryiana Klinkner, Silvestre Mesi 08/09/2017, 11:41 AM

## 2017-08-09 NOTE — Progress Notes (Cosign Needed)
Inpatient [facility] Individual Statement of Services  Patient Name:  Brandon [name]  Date:  08/09/2017  Welcome to the [facility].  Our goal is to provide you with an individualized program based on your diagnosis and situation, designed to meet your specific needs.  With this comprehensive rehabilitation program, you will be expected to participate in at least 3 hours of rehabilitation therapies Monday-Friday, with modified therapy programming on the weekends.  Your rehabilitation program will include the following services:  Physical Therapy (PT), Occupational Therapy (OT), Speech Therapy (ST), 24 hour per day rehabilitation nursing, Case Management (Social Worker), Rehabilitation Medicine, Nutrition Services and Pharmacy Services  Weekly team conferences will be held on Tuesdays to discuss your progress.  Your Social Worker will talk with you frequently to get your input and to update you on team discussions.  Team conferences with you and your family in attendance may also be held.  Expected length of stay:  2 weeks  Overall anticipated outcome:  Supervision with physical tasks; minimal assistance with cognitive tasks  Depending on your progress and recovery, your program may change. Your Social Worker will coordinate services and will keep you informed of any changes. Your Social Worker's name and contact numbers are listed  below.  The following services may also be recommended but are not provided by the [place] will be made to provide these services after discharge if needed.  Arrangements include referral to agencies that provide these services.  Your insurance has been verified to be:  [employer] and [place] primary doctor is:  Dr. Maurice Small  Pertinent information will be shared with your doctor and your  insurance company.  Social Worker:  Alfonse Alpers, LCSW  [phone] or ([phone]  Information discussed with and copy given to patient by: Trey Sailors, 08/09/2017, 9:43 AM

## 2017-08-10 ENCOUNTER — Inpatient Hospital Stay (HOSPITAL_COMMUNITY): Payer: Medicare HMO | Admitting: Physical Therapy

## 2017-08-10 ENCOUNTER — Inpatient Hospital Stay (HOSPITAL_COMMUNITY): Payer: Medicare HMO | Admitting: Speech Pathology

## 2017-08-10 ENCOUNTER — Inpatient Hospital Stay (HOSPITAL_COMMUNITY): Payer: Medicare HMO

## 2017-08-10 ENCOUNTER — Inpatient Hospital Stay (HOSPITAL_COMMUNITY): Payer: Medicare HMO | Admitting: Occupational Therapy

## 2017-08-10 LAB — GLUCOSE, CAPILLARY
GLUCOSE-CAPILLARY: 158 mg/dL — AB (ref 65–99)
Glucose-Capillary: 95 mg/dL (ref 65–99)
Glucose-Capillary: 111 mg/dL — ABNORMAL HIGH (ref 65–99)
Glucose-Capillary: 115 mg/dL — ABNORMAL HIGH (ref 65–99)

## 2017-08-10 MED ORDER — FLEET ENEMA 7-19 GM/118ML RE ENEM: 1.0000 | ENEMA | Freq: Every day | RECTAL | Status: DC | PRN

## 2017-08-10 MED ORDER — BISACODYL 10 MG RE SUPP: 10.0000 mg | Freq: Every day | RECTAL | Status: DC | PRN

## 2017-08-10 MED ORDER — SENNOSIDES-DOCUSATE SODIUM 8.6-50 MG PO TABS
2.0000 | ORAL_TABLET | Freq: Every day | ORAL | Status: DC
  Administered 2017-08-10 – 2017-08-18 (×8): 2 via ORAL
  Filled 2017-08-10 (×10): qty 2

## 2017-08-10 NOTE — IPOC Note (Signed)
Overall Plan of Care [facility]) Patient Details Name: Brandon [name] MRN: [id number] DOB: 1946/01/03  Admitting Diagnosis: Aortic stenosis  Hospital Problems: Active Problems:   Debility     Functional Problem List: Nursing Bladder, Bowel, Edema, Endurance, Medication Management, Motor, Nutrition, Pain, Safety, Sensory, Skin Integrity  PT Balance, Endurance, Motor, Safety  OT Balance, Cognition, Endurance, Motor, Perception, Safety, Vision  SLP Cognition, Safety  TR         Basic ADL's: OT Grooming, Bathing, Dressing, Toileting     Advanced  ADL's: OT       Transfers: PT Bed Mobility, Bed to Chair, Car, Manufacturing systems engineer, Metallurgist: PT Ambulation, Stairs     Additional Impairments: OT None  SLP Swallowing, Communication      TR      Anticipated Outcomes Item Anticipated Outcome  Self Feeding supervision/ set up  Chief Financial Officer- Supervision    Basic self-care  Supervision  Toileting  Supervision   Bathroom Transfers Supervision  Bowel/Bladder  min assist   Transfers  Supervision  Locomotion  Supervision  Communication  Min  Cognition  Min  Pain  less than 2  Safety/Judgment  min assist    Therapy Plan: PT Intensity: Minimum of 1-2 x/day ,45 to 90 minutes PT Frequency: 5 out of 7 days PT Duration Estimated Length of Stay: 14-16 days OT Intensity: Minimum of 1-2 x/day, 45 to 90 minutes OT Frequency: 5 out of 7 days OT Duration/Estimated Length of Stay: 14-15 days SLP Intensity: Minumum of 1-2 x/day, 30 to 90 minutes SLP Frequency: 3 to 5 out of 7 days SLP Duration/Estimated Length of Stay: 08/22/2017    Team Interventions: Nursing Interventions Patient/Family Education, Bladder Management, Bowel Management, Disease Management/Prevention, Pain Management, Medication Management, Skin Care/Wound Management, Cognitive Remediation/Compensation, Dysphagia/Aspiration Precaution Training, Discharge Planning  PT interventions  Ambulation/gait training, Community reintegration, DME/adaptive equipment instruction, Neuromuscular re-education, IT trainer, UE/LE Strength taining/ROM, Training and development officer, Therapeutic Activities, UE/LE Coordination activities, Functional mobility training, Patient/family education, Therapeutic Exercise  OT Interventions Balance/vestibular training, Cognitive remediation/compensation, Discharge planning, DME/adaptive equipment instruction, Functional mobility training, Pain management, Patient/family education, Self Care/advanced ADL retraining, Therapeutic Exercise, Therapeutic Activities, UE/LE Strength taining/ROM, UE/LE Coordination activities, Visual/perceptual remediation/compensation  SLP Interventions Cueing hierarchy, Cognitive remediation/compensation, Dysphagia/aspiration precaution training, Functional tasks, Medication managment  TR Interventions    SW/CM Interventions Discharge Planning, Psychosocial Support, Patient/Family Education   Barriers to Discharge MD  Medical stability and Nutritional means  Nursing Wound Care, New diabetic, Nutrition means    PT Behavior (dec cognitive & safety awareness )    OT      SLP Behavior, Medical stability    SW       Team Discharge Planning: Destination: PT-Home ,OT- Home , SLP-Home Projected Follow-up: PT-24 hour supervision/assistance, Home health PT, OT-  Home health OT, SLP-Home Health SLP, Outpatient SLP Projected Equipment Needs: PT-Rolling walker with 5" wheels, To be determined, OT- Tub/shower bench, 3 in 1 bedside comode, SLP-None recommended by SLP Equipment Details: PT- , OT-  Patient/family involved in discharge planning: PT- Patient, Family member/caregiver,  OT-Patient, Family member/caregiver, SLP-Patient, Family member/caregiver  MD ELOS: 16-19d Medical Rehab Prognosis:  Good Assessment:  71 y.o.right handed malewith history of CVA with carotid stenting, hypertension, aortic stenosis, tobacco abuse, mild  LV dysfunction, Chronic back pain and recent rotator cuff surgery 05/18/2017. Presented 07/13/2017 for nonspecific chest pain, diaphoresis elevated troponin 0.12. History taken from chart review and wife. Patient lives with spouse reported to be  independent prior to admission. One level home with 1 step to entry. Chest x-ray with interval development of pulmonary edema. Treated with intravenous Heparin, Lasix , Lopressor added for bouts of atrial fibrillation with RVR/troponin 0.12 and heparin was transitioned to eliquis.[name] He did develop some agitation received Xanax as well as Haldol. Echocardiogram with ejection fraction of 37% grade 2 diastolic dysfunction. Patient did require intubation for acute hypoxic respiratory failure 07/14/2017. TEE showed moderate LVH, moderate AI with moderate to severe mitral regurgitation. Venous Dopplers lower extremities negative for DVT. He was extubated 07/25/2017 with course complicated by suspect pneumonia. Antibiotics completed 07/29/2017 as well as completing course of prednisone.. Ongoing monitoring of mental status/Delirium with cranial CT/MRI scan 08/04/2017 showed no acute intracranial abnormalities with old remote right MCA territory infarct. Palliative care was consulted to establish goals of care.A Cor-Track tube was placed 07/26/2017 for nutritional support andModified barium swallow completed 08/07/2017 placed on a dysphagia #2 nectar thick liquid diet    See Team Conference Notes for weekly updates to the plan of care

## 2017-08-10 NOTE — Progress Notes (Signed)
Occupational Therapy Session Note  Patient Details  Name: Brandon [name] MRN: [id number] Date of Birth: 1946/04/18  Today's Date: 08/10/2017 OT Individual Time: 1500-1530 OT Individual Time Calculation (min): 30 min    Short Term Goals: Week 1:  OT Short Term Goal 1 (Week 1): Pt will be able to transfer on and off toilet with min A. OT Short Term Goal 2 (Week 1): Pt will bathe self with min A. OT Short Term Goal 3 (Week 1): Pt will don shirt with supervision. OT Short Term Goal 4 (Week 1): Pt will don pants with min A. OT Short Term Goal 5 (Week 1): Pt will demonstrate improved attention to ADL task for at least 5 minutes at a time.   Skilled Therapeutic Interventions/Progress Updates:    Upon entering the room, pt supine in bed with wife present in room. Pt with no c/o pain this session and agreeable to OT intervention. Supine >sit with min A to EOB. Squat pivot transfer from bed >wheelchair with min A. OT assisted pt to day room via wheelchair for time management. Pt engaged in resistive peg board activity with minimally and moderately difficult patterns. Pt required max multimodal cues for correct placement of pegs for patterns. Pt returning to room at end of session in same manner and staying in wheelchair with quick release belt donned and wife remaining in the room.   Therapy Documentation Precautions:  Precautions Precautions: Shoulder, Fall Precaution Comments: Per Dr Tamera Punt (pt s/p Lt RCR early June) NWB Lt UE except to assist with standing and able to use RW; No pulling or pushing with Lt UE;  unrestricted PROM/AROM is okay.  No strengthening   Restrictions Weight Bearing Restrictions: No LUE Weight Bearing: Non weight bearing (Can use to transfer w RW) General:   Vital Signs: Therapy Vitals Temp: 97.9 F (36.6 C) Temp Source: Oral Pulse Rate: 71 Resp: 20 BP: (!) 142/57 Patient Position (if appropriate): Lying Oxygen Therapy SpO2: 99 % O2 Device: Not  Delivered Pain:   ADL: ADL ADL Comments: refer to functional navigator  See Function Navigator for Current Functional Status.   Therapy/Group: Individual Therapy  Gypsy Decant 08/10/2017, 4:33 PM

## 2017-08-10 NOTE — Progress Notes (Signed)
[facility] PHYSICAL MEDICINE & REHABILITATION     PROGRESS NOTE    Subjective/Complaints: Slept well, best night yet. Got dizzy when he transferred to w/c  ROS: pt denies nausea, vomiting, diarrhea, cough, shortness of breath or chest pain   Objective: Vital Signs: Blood pressure 100/75, pulse 70, temperature 97.8 F (36.6 C), temperature source Oral, resp. rate 18, height 5\' 9"  (1.753 m), weight 95.7 kg (211 lb), SpO2 97 %. No results found.  Recent Labs  08/08/17 0342  WBC 11.1*  HGB 8.5*  HCT 27.8*  PLT 380    Recent Labs  08/08/17 0342  NA 139  K 4.2  CL 105  GLUCOSE 105*  BUN 46*  CREATININE 1.14  CALCIUM 8.6*   CBG (last 3)   Recent Labs  08/09/17 1646 08/09/17 2047 08/10/17 0649  GLUCAP 100* 125* 95    Wt Readings from Last 3 Encounters:  08/10/17 95.7 kg (211 lb)  08/07/17 101.9 kg (224 lb 9.6 oz)  05/18/17 114.3 kg (252 lb)    Physical Exam:   Constitutional: He appears well-developed. No distress Obese  HENT:  Oral mucosa pink and moist  Eyes: Right eye exhibits no discharge. Left eye exhibits no discharge.  Pupils reactive to light  Neck: Normal range of motion. Neck supple. No thyromegaly present.  Cardiovascular: RRR with murmur. No JVD     Respiratory:  Fair air movement no distress GI: NT/ND.  Musculoskeletal: He exhibits peripheral tr edema. He exhibits no tenderness.  Neurological: He is alert.  A&Ox1 to person. Follows simple commands. More alert. Speech less dysarthric. Remains HOH. Motor: RUE: 3 to 3+/5 right deltoid, bicep, tricep, hand grip Left upper extremity has at least 3+/5 strength in the deltoid, bicep, tricep, hand grip Lower extremity strength is 3+/5 at the hip flexors, 4- knee extensors, ankle dorsiflexors.---motor exam stable     Skin: Skin is warm and dry.  Psychiatric: pleasant and cooperative   Assessment/Plan: 1. Functional deficits secondary to debility/prior CVA which require 3+ hours per day of  interdisciplinary therapy in a comprehensive inpatient rehab setting. Physiatrist is providing close team supervision and 24 hour management of active medical problems listed below. Physiatrist and rehab team continue to assess barriers to discharge/monitor patient progress toward functional and medical goals.  Function:  Bathing Bathing position   Position: Wheelchair/chair at sink  Bathing parts Body parts bathed by patient: Right arm, Left arm, Chest, Abdomen (with mod cues ) Body parts bathed by helper: Right lower leg, Left lower leg, Right upper leg, Left upper leg, Back, Buttocks, Front perineal area  Bathing assist        Upper Body Dressing/Undressing Upper body dressing   What is the patient wearing?: Pull over shirt/dress     Pull over shirt/dress - Perfomed by patient: Thread/unthread right sleeve, Thread/unthread left sleeve, Put head through opening Pull over shirt/dress - Perfomed by helper: Pull shirt over trunk        Upper body assist Assist Level: Supervision or verbal cues      Lower Body Dressing/Undressing Lower body dressing   What is the patient wearing?: Pants, Ted Hose, Non-skid slipper socks       Pants- Performed by helper: Thread/unthread right pants leg, Thread/unthread left pants leg, Pull pants up/down   Non-skid slipper socks- Performed by helper: Don/doff right sock, Don/doff left sock               TED Hose - Performed by helper: Don/doff right TED  hose, Don/doff left TED hose  Lower body assist        Toileting Toileting          Toileting assist     Transfers Chair/bed transfer   Chair/bed transfer method: Stand pivot Chair/bed transfer assist level: Moderate assist (Pt 50 - 74%/lift or lower) Chair/bed transfer assistive device: Medical sales representative     Max distance: 25 Assist level: Touching or steadying assistance (Pt > 75%)   Wheelchair          Cognition Comprehension Comprehension assist  level: Understands basic 50 - 74% of the time/ requires cueing 25 - 49% of the time  Expression Expression assist level: Expresses basic 50 - 74% of the time/requires cueing 25 - 49% of the time. Needs to repeat parts of sentences.  Social Interaction Social Interaction assist level: Interacts appropriately 50 - 74% of the time - May be physically or verbally inappropriate.  Problem Solving Problem solving assist level: Solves basic less than 25% of the time - needs direction nearly all the time or does not effectively solve problems and may need a restraint for safety  Memory Memory assist level: Recognizes or recalls less than 25% of the time/requires cueing greater than 75% of the time    Medical Problem List and Plan: 1.  Debilitation and right upper extremity weakness due to history of CVA/acute hypoxemic respiratory failure. Patient was extubated 07/25/2017  -continue therapies 2.  DVT Prophylaxis/Anticoagulation: Eliquis. Monitor for any increased bleeding episodes 3. Pain Management/chronic back pain: Tylenol as needed. Patient on oxycodone 5-325mg  1 to 2 tabs every 4 hours as needed prior to admission. Resume as needed  -limiting given ongoing encephalopathy 4. Mood/delirium: Provide emotional support. Patient on Wellbutrin 150 mg twice a day as well as Celexa 20 mg daily prior to admission.   -resumed celexa 10mg  QHS  -sleep chart  -continue scheduled trazodone 5. Neuropsych: This patient is capable of making decisions on his own behalf. 6. Skin/Wound Care: Routine skin checks 7. Fluids/Electrolytes/Nutrition: encourage fluids  -continue to encourage fluids 8. Acute on chronic diastolic congestive heart failure.   -weights down, now light headed and hypotensive. Probably volume depleted  -hold lasix  -TEDS/abdominal binder  -follow up labs in the AM.   [facility] Weights   08/07/17 1600 08/08/17 0535  Weight: 103.4 kg (228 lb) 101.2 kg (223 lb)    - Follow-up per cardiology  services.  9. Atrial fibrillation. Continue Eliquis. Amiodarone 200 mg twice a day 10. Dysphagia. Dysphagia #2 nectar liquids after MBS 08/07/2017.   -protein supp  -encourage fluids  -added daily megace to boost appetite 11. History of CVA. Follow-up per neurology services and advised to continue eliquis. 12. Recent left rotator cuff surgery. Per Dr. Tamera Punt nonweightbearing left upper extremity except to assist with standing and able to use rolling walker. No pulling or pushing with left upper extremity. Unrestricted passive range of motion active range of motion. No strengthening restrictions 13. Acute on chronic anemia. Latest transfusion 08/07/2017. Follow-up hgb 8.5 today  -follow serially  -no signs of blood loss 14. COPD/tobacco abuse. Counseling. Check oxygen saturations every shift 15. CKD   LOS (Days) 3 A FACE TO FACE EVALUATION WAS PERFORMED  Meredith Staggers, MD 08/10/2017 8:51 AM

## 2017-08-10 NOTE — Progress Notes (Signed)
Occupational Therapy Session Note  Patient Details  Name: Brandon [name] MRN: [id number] Date of Birth: 1946-06-09  Today's Date: 08/10/2017 OT Individual Time: 1100-1155 OT Individual Time Calculation (min): 55 min    Short Term Goals: Week 1:  OT Short Term Goal 1 (Week 1): Pt will be able to transfer on and off toilet with min A. OT Short Term Goal 2 (Week 1): Pt will bathe self with min A. OT Short Term Goal 3 (Week 1): Pt will don shirt with supervision. OT Short Term Goal 4 (Week 1): Pt will don pants with min A. OT Short Term Goal 5 (Week 1): Pt will demonstrate improved attention to ADL task for at least 5 minutes at a time.   Skilled Therapeutic Interventions/Progress Updates:    Pt resting in w/c upon arrival.  Pt engaged in BADL retraining including bating at shower level and dressing with sit<>stand from w/c at sink.  Pt transferred w/c<>tub bench with min A and min verbal cues for safety awareness.  Pt performed sit<>stand with grab bars at supervision level. Pt required steady A for standing to pull up pants at sink.  Pt required min verbal cues for safety awareness throughout session.  Pt requires more than a reasonable amount of time to initiate and complete tasks with multiple rest breaks.  Pt remained in w/c with QRB in place and all needs within reach.   Therapy Documentation Precautions:  Precautions Precautions: Shoulder, Fall Precaution Comments: Per Dr Tamera Punt (pt s/p Lt RCR early June) NWB Lt UE except to assist with standing and able to use RW; No pulling or pushing with Lt UE;  unrestricted PROM/AROM is okay.  No strengthening   Restrictions Weight Bearing Restrictions: No LUE Weight Bearing: Non weight bearing (Can use to transfer w RW)   Pain: Pain Assessment Pain Assessment: No/denies pain  See Function Navigator for Current Functional Status.   Therapy/Group: Individual Therapy  Leroy Libman 08/10/2017, 12:05 PM

## 2017-08-10 NOTE — Progress Notes (Signed)
Calorie Count Note  72 hour calorie count ordered.  Diet: Dysphagia 2, Nectar thick liquids Supplements: 30 ml Prostat TID, Ensure Enlive TID  8/28 Breakfast: 169 kcal, 6 grams of protein Lunch: 333 kcal, 16 grams of protein Dinner:281 kcal, 5 grams of protein Supplements: 360 kcal, 15 grams of protein  Total intake: 1143 kcal (57% of kcal needs) 42 grams of protein (42% of protein needs)  8/29 Breakfast: 71 kcal, 1 gram of protein Lunch: 347 kcal, 9 grams of protein Dinner: 393 kcal, 12 grams of protein Supplements: 760 kcal, 45 grams of protein  Total intake: 1571 kcal (79% of minimum estimated needs)  67 grams of protein (67% of minimum estimated needs)  8/30 Breakfast: 133 kcals, 5 grams protein Lunch: 183 kcals, 3 grams protein Dinner: 238 kcals, 17 grams protein Supplements: 550 kcals, 50 grams protein  Total intake: 1104 kcal (55% of minimum estimated needs)  75 grams protein (75% of minimum estimated needs)  Average Total intake: 1273 kcal (64% of minimum estimated needs)  61 grams protein (61% of minimum estimated needs)  Estimated Nutritional Needs: [id number] Protein:100-120 grams Fluid:Per MD  Nutrition Dx: Inadequate oral intakerelated to dysphagiaas evidenced by meal completion < 50%; ongoing  Goal: Pt to meet >/= 90% of their estimated nutrition needs; progressing  Intervention:   -D/c calorie count -Magic Cup TID between meals -Continue Ensure Enlive po TID, each supplement provides 350 kcal and 20 grams of protein -Continue 30 ml Prostat TID, each supplement provides 100 kcals and 15 grams protein  Brandon [name], RD, LDN, CDE Pager: [phone] After hours Pager: [phone]

## 2017-08-10 NOTE — Progress Notes (Signed)
Physical Therapy Session Note  Patient Details  Name: Brandon [name] MRN: [id number] Date of Birth: [birth year]  Today's Date: 08/10/2017 PT Individual Time: 0800-0900 PT Individual Time Calculation (min): 60 min   Short Term Goals: Week 1:  PT Short Term Goal 1 (Week 1): Pt will perform sit to stand with min assist and min cues  PT Short Term Goal 2 (Week 1): Pt will navigate up/down 8, 3" stairs with min assist PT Short Term Goal 3 (Week 1): Pt will ambulate 80 ft with RW and min assist PT Short Term Goal 4 (Week 1): Pt will supine<>sit with supervision and min cues   Skilled Therapeutic Interventions/Progress Updates:    Pt received sitting up in wheelchair, agreeable to therapy, wife present. Pt performed static standing for ~10-15 sec in between taking medications in order to work on increasing standing tolerance. Pt reported some dizziness during standing. Pt vitals as reported below. Pt BP dropped after 25 ft walk with RW and min guard. Pt reported feeling dizzy during walk.TED hose applied. Pt ambulated 25 ft again BP reported below, pt had no symptoms during second walk. Gait pattern improved with wider BOS initially, scissoring occurred as pt became fatigued. Pt performed 2 sets, 20 reps of hip abduction and adduction exercises. Pt unable to keep track of how many reps he has completed even with several verbal cues. Pt able to increase standing tolerance to 1 min with bilat UE support and close supervision. Pt returned to room total assist. Left sitting up in wheelchair, quick release belt on, all needs in reach, wife present.   Therapy Documentation Precautions:  Precautions Precautions: Shoulder, Fall Precaution Comments: Per Dr Tamera Punt (pt s/p Lt RCR early June) NWB Lt UE except to assist with standing and able to use RW; No pulling or pushing with Lt UE;  unrestricted PROM/AROM is okay.  No strengthening   Restrictions Weight Bearing Restrictions: Yes LUE Weight Bearing: Non  weight bearing (rotator cuff precautions) Vital Signs: BP pre exercise: 121/50 no symptoms  BP post exercise: 71/42 sympoms of "dizziness" BP post rest: 104/49 no symptoms BP post exercise w/TED hose: 121/44 no symptoms   See Function Navigator for Current Functional Status.   Therapy/Group: Individual Therapy  Gwinda Passe, SPT 08/10/2017, 8:50 AM

## 2017-08-10 NOTE — Progress Notes (Signed)
Speech Language Pathology Daily Session Note  Patient Details  Name: Brandon [name] MRN: [id number] Date of Birth: 10-Nov-1946  Today's Date: 08/10/2017 SLP Individual Time: 1000-1100 SLP Individual Time Calculation (min): 60 min  Short Term Goals: Week 1: SLP Short Term Goal 1 (Week 1): Pt will demonstrate sustained attention to functional task for 5 minutes with Mod multimodal cues. SLP Short Term Goal 2 (Week 1): Pt will demonstrate intellectual awareness and identify 2 physical and 2 cognitive deficits given Mod verbal cues.  SLP Short Term Goal 3 (Week 1): Pt will complete basic, familar tasks with Mod verbal and question cues for functional problem solving. SLP Short Term Goal 4 (Week 1): Pt will consume ice chip trials without overt s/s aspiration over 3 consective sessions in order to determine readiness for instrumental study.  SLP Short Term Goal 5 (Week 1): Pt will increase vocal intensity at phrase level to achieve 80% intelligibilty with Mod verbal cues.  SLP Short Term Goal 6 (Week 1): Pt will self-monitor and  self-correct verbal and fucntional errors with Max A multimodal cues.   Skilled Therapeutic Interventions: Skilled treatment session focused on cognitive goals. SLP facilitated session by providing Max A verbal cues for problem solving and recall during a basic money management task with Max A verbal cues needed to self-monitor and correct errors throughout task. Patient demonstrated appropriate sustained attention to task for ~5 minute intervals. Patient handed off to OT. Continue with current plan of care.      Function:  Eating Eating   Modified Consistency Diet: Yes Eating Assist Level: Supervision or verbal cues;Helper checks for pocketed food           Cognition Comprehension Comprehension assist level: Understands basic 50 - 74% of the time/ requires cueing 25 - 49% of the time  Expression   Expression assist level: Expresses basic 50 - 74% of the  time/requires cueing 25 - 49% of the time. Needs to repeat parts of sentences.  Social Interaction Social Interaction assist level: Interacts appropriately 50 - 74% of the time - May be physically or verbally inappropriate.  Problem Solving Problem solving assist level: Solves basic 25 - 49% of the time - needs direction more than half the time to initiate, plan or complete simple activities  Memory Memory assist level: Recognizes or recalls less than 25% of the time/requires cueing greater than 75% of the time    Pain No/Denies Pain   Therapy/Group: Individual Therapy  Garnet Overfield 08/10/2017, 4:23 PM

## 2017-08-11 ENCOUNTER — Inpatient Hospital Stay (HOSPITAL_COMMUNITY): Payer: Medicare HMO

## 2017-08-11 ENCOUNTER — Inpatient Hospital Stay (HOSPITAL_COMMUNITY): Payer: Medicare HMO | Admitting: Speech Pathology

## 2017-08-11 ENCOUNTER — Inpatient Hospital Stay (HOSPITAL_COMMUNITY): Payer: Medicare HMO | Admitting: Physical Therapy

## 2017-08-11 DIAGNOSIS — I509 Heart failure, unspecified: Secondary | ICD-10-CM

## 2017-08-11 LAB — BASIC METABOLIC PANEL
Anion gap: 8 (ref 5–15)
BUN: 32 mg/dL — ABNORMAL HIGH (ref 6–20)
CALCIUM: 8.8 mg/dL — AB (ref 8.9–10.3)
CO2: 23 mmol/L (ref 22–32)
CREATININE: 1.4 mg/dL — AB (ref 0.61–1.24)
Chloride: 106 mmol/L (ref 101–111)
GFR, EST AFRICAN AMERICAN: 57 mL/min — AB (ref >60)
GFR, EST NON AFRICAN AMERICAN: 49 mL/min — AB (ref >60)
GLUCOSE: 112 mg/dL — AB (ref 65–99)
Potassium: 4.2 mmol/L (ref 3.5–5.1)
Sodium: 137 mmol/L (ref 135–145)

## 2017-08-11 LAB — GLUCOSE, CAPILLARY
GLUCOSE-CAPILLARY: 141 mg/dL — AB (ref 65–99)
Glucose-Capillary: 119 mg/dL — ABNORMAL HIGH (ref 65–99)
Glucose-Capillary: 107 mg/dL — ABNORMAL HIGH (ref 65–99)
Glucose-Capillary: 134 mg/dL — ABNORMAL HIGH (ref 65–99)

## 2017-08-11 MED ORDER — SORBITOL 70 % SOLN
60.0000 mL | Status: AC
  Administered 2017-08-11: 60 mL via ORAL
  Filled 2017-08-11: qty 60

## 2017-08-11 MED ORDER — MEGESTROL ACETATE 400 MG/10ML PO SUSP
400.0000 mg | Freq: Two times a day (BID) | ORAL | Status: DC
  Administered 2017-08-11 – 2017-08-15 (×8): 400 mg via ORAL
  Filled 2017-08-11 (×8): qty 10

## 2017-08-11 MED ORDER — FERROUS SULFATE 325 (65 FE) MG PO TABS
325.0000 mg | ORAL_TABLET | Freq: Every day | ORAL | Status: DC
  Administered 2017-08-11 – 2017-08-22 (×12): 325 mg via ORAL
  Filled 2017-08-11 (×12): qty 1

## 2017-08-11 NOTE — Progress Notes (Signed)
Speech Language Pathology Daily Session Note  Patient Details  Name: Brandon [name] MRN: [id number] Date of Birth: 1946-03-21  Today's Date: 08/11/2017 SLP Individual Time: 1115-1200 SLP Individual Time Calculation (min): 45 min  Short Term Goals: Week 1: SLP Short Term Goal 1 (Week 1): Pt will demonstrate sustained attention to functional task for 5 minutes with Mod multimodal cues. SLP Short Term Goal 2 (Week 1): Pt will demonstrate intellectual awareness and identify 2 physical and 2 cognitive deficits given Mod verbal cues.  SLP Short Term Goal 3 (Week 1): Pt will complete basic, familar tasks with Mod verbal and question cues for functional problem solving. SLP Short Term Goal 4 (Week 1): Pt will consume ice chip trials without overt s/s aspiration over 3 consective sessions in order to determine readiness for instrumental study.  SLP Short Term Goal 5 (Week 1): Pt will increase vocal intensity at phrase level to achieve 80% intelligibilty with Mod verbal cues.  SLP Short Term Goal 6 (Week 1): Pt will self-monitor and  self-correct verbal and fucntional errors with Max A multimodal cues.   Skilled Therapeutic Interventions: Skilled treatment session focused on dysphagia and cognition goals. SLP facilitated session by providing skilled observation of pt consuming trials of ice chips and thin water via spoon and cup. Pt with delayed cough x 2 via cup possibility d/t larger bolus size. However his wife states that he has "COPD -like cough" at baseline. EMST values reassessed and set to 30 cmH2O. SLP further facilitated session by providing Max A cues to state any deficits related to current hospitalization and pt with frequent comments regarding confusion (i.e., granddaughter is picking oranges). Pt with multiple off-topic comments that didn't make sense. Pt appears appropriate for water protocol, orders received and pt was returned to room, left upright in wheelchair with wife present. Continue  per current plan of care.      Function:  Eating Eating   Modified Consistency Diet: No Eating Assist Level: Supervision or verbal cues           Cognition Comprehension Comprehension assist level: Understands basic 50 - 74% of the time/ requires cueing 25 - 49% of the time  Expression   Expression assist level: Expresses basic 75 - 89% of the time/requires cueing 10 - 24% of the time. Needs helper to occlude trach/needs to repeat words.  Social Interaction Social Interaction assist level: Interacts appropriately 75 - 89% of the time - Needs redirection for appropriate language or to initiate interaction.  Problem Solving Problem solving assist level: Solves basic 25 - 49% of the time - needs direction more than half the time to initiate, plan or complete simple activities  Memory Memory assist level: Recognizes or recalls less than 25% of the time/requires cueing greater than 75% of the time    Pain Pain Assessment Pain Assessment: No/denies pain  Therapy/Group: Individual Therapy  Arrington Yohe Rutherford Nail 08/11/2017, 12:38 PM

## 2017-08-11 NOTE — Progress Notes (Signed)
[place] PHYSICAL MEDICINE & REHABILITATION     PROGRESS NOTE    Subjective/Complaints: Up with therapy. Dizziness not as bad. BP's holding up better with activity  ROS: pt denies nausea, vomiting, diarrhea, cough, shortness of breath or chest pain   Objective: Vital Signs: Blood pressure 121/62, pulse 87, temperature 98.6 F (37 C), temperature source Oral, resp. rate 18, height 5\' 9"  (1.753 m), weight 95.7 kg (211 lb), SpO2 99 %. No results found. No results for input(s): WBC, HGB, HCT, PLT in the last 72 hours.  Recent Labs  08/11/17 0458  NA 137  K 4.2  CL 106  GLUCOSE 112*  BUN 32*  CREATININE 1.40*  CALCIUM 8.8*   CBG (last 3)   Recent Labs  08/10/17 1700 08/10/17 2109 08/11/17 0646  GLUCAP 115* 158* 119*    Wt Readings from Last 3 Encounters:  08/10/17 95.7 kg (211 lb)  08/07/17 101.9 kg (224 lb 9.6 oz)  05/18/17 114.3 kg (252 lb)    Physical Exam:   Constitutional: He appears well-developed. No distress Obese  HENT:  Mucosa pink Eyes: Right eye exhibits no discharge. Left eye exhibits no discharge.  Pupils reactive to light  Neck: Normal range of motion. Neck supple. No thyromegaly present.  Cardiovascular: RRR without murmur. No JVD     Respiratory:  CTA Bilaterally without wheezes or rales. Normal effort  GI: NT/ND.  Musculoskeletal: He exhibits trace peripheral  edema. He exhibits no tenderness.  Neurological: He is alert.  A&Ox1 to person, place. Distracted. Remains HOH. Motor: RUE: 3 to 3+/5 right deltoid, bicep, tricep, hand grip Left upper extremity has at least 3+/5 strength in the deltoid, bicep, tricep, hand grip Lower extremity strength is 3+ to 4-/5 at the hip flexors, 4- knee extensors, ankle dorsiflexors.---    Skin: Skin is warm and dry.  Psychiatric: pleasant and cooperative   Assessment/Plan: 1. Functional deficits secondary to debility/prior CVA which require 3+ hours per day of interdisciplinary therapy in a  comprehensive inpatient rehab setting. Physiatrist is providing close team supervision and 24 hour management of active medical problems listed below. Physiatrist and rehab team continue to assess barriers to discharge/monitor patient progress toward functional and medical goals.  Function:  Bathing Bathing position   Position: Shower  Bathing parts Body parts bathed by patient: Right arm, Left arm, Chest, Abdomen, Front perineal area, Buttocks, Right upper leg, Left upper leg Body parts bathed by helper: Right lower leg, Left lower leg, Back  Bathing assist Assist Level: Touching or steadying assistance(Pt > 75%)      Upper Body Dressing/Undressing Upper body dressing   What is the patient wearing?: Pull over shirt/dress     Pull over shirt/dress - Perfomed by patient: Thread/unthread right sleeve, Thread/unthread left sleeve, Put head through opening Pull over shirt/dress - Perfomed by helper: Pull shirt over trunk        Upper body assist Assist Level: Supervision or verbal cues      Lower Body Dressing/Undressing Lower body dressing   What is the patient wearing?: Pants, Non-skid slipper socks, Ted Hose     Pants- Performed by patient: Thread/unthread right pants leg, Thread/unthread left pants leg, Pull pants up/down Pants- Performed by helper: Thread/unthread right pants leg, Thread/unthread left pants leg, Pull pants up/down   Non-skid slipper socks- Performed by helper: Don/doff right sock, Don/doff left sock               TED Hose - Performed by helper: Don/doff right  TED hose, Don/doff left TED hose  Lower body assist        Toileting Toileting     Toileting steps completed by helper: Adjust clothing prior to toileting, Performs perineal hygiene, Adjust clothing after toileting    Toileting assist Assist level: Two helpers (per [employer], NT)   Transfers Chair/bed transfer   Chair/bed transfer method: Stand pivot Chair/bed transfer assist level:  Moderate assist (Pt 50 - 74%/lift or lower) Chair/bed transfer assistive device: Armrests     Locomotion Ambulation     Max distance: 120 Assist level: Touching or steadying assistance (Pt > 75%)   Wheelchair          Cognition Comprehension Comprehension assist level: Understands basic 50 - 74% of the time/ requires cueing 25 - 49% of the time  Expression Expression assist level: Expresses basic 50 - 74% of the time/requires cueing 25 - 49% of the time. Needs to repeat parts of sentences.  Social Interaction Social Interaction assist level: Interacts appropriately 50 - 74% of the time - May be physically or verbally inappropriate.  Problem Solving Problem solving assist level: Solves basic 25 - 49% of the time - needs direction more than half the time to initiate, plan or complete simple activities  Memory Memory assist level: Recognizes or recalls less than 25% of the time/requires cueing greater than 75% of the time    Medical Problem List and Plan: 1.  Debilitation and right upper extremity weakness due to history of CVA/acute hypoxemic respiratory failure. Patient was extubated 07/25/2017  -continue therapies to increase stamina/balance 2.  DVT Prophylaxis/Anticoagulation: Eliquis. Monitor for any increased bleeding episodes 3. Pain Management/chronic back pain: Tylenol as needed. Patient on oxycodone 5-325mg  1 to 2 tabs every 4 hours as needed prior to admission. Resume as needed  -limiting given ongoing encephalopathy 4. Mood/delirium: Provide emotional support. Patient on Wellbutrin 150 mg twice a day as well as Celexa 20 mg daily prior to admission.   -resumed celexa 10mg  QHS  -sleep chart ongoing  -continue scheduled trazodone 5. Neuropsych: This patient is capable of making decisions on his own behalf. 6. Skin/Wound Care: Routine skin checks 7. Fluids/Electrolytes/Nutrition: encourage fluids  -continue to encourage fluids 8. Acute on chronic diastolic congestive heart  failure.   -weights down, now light headed and hypotensive. Probably volume depleted, anemia component too  -hold lasix  -TEDS/abdominal binder  -weights still down (pending today)   Filed Weights   08/08/17 0535 08/09/17 0344 08/10/17 0504  Weight: 101.2 kg (223 lb) 99.3 kg (219 lb) 95.7 kg (211 lb)    -BMET reviewed, BUN decrease/Cr increased sl---follow serially  - Follow-up per cardiology services as needed 9. Atrial fibrillation. Continue Eliquis. Amiodarone 200 mg twice a day 10. Dysphagia. Dysphagia #2 nectar liquids after MBS 08/07/2017.   -protein supp  -encourage fluids  - increase megace to bid 11. History of CVA. Follow-up per neurology services and advised to continue eliquis. 12. Recent left rotator cuff surgery. Per Dr. Tamera Punt nonweightbearing left upper extremity except to assist with standing and able to use rolling walker. No pulling or pushing with left upper extremity. Unrestricted passive range of motion active range of motion. No strengthening restrictions 13. Acute on chronic anemia. Latest transfusion 08/07/2017. Follow-up hgb 8.5 on 8/28  -follow serially----recheck tomorrow 9/1  -no signs of blood loss 14. COPD/tobacco abuse. Counseling. Check oxygen saturations every shift 15. CKD--Cr 1.4 9/1   LOS (Days) 4 A FACE TO FACE EVALUATION WAS PERFORMED  Kiara Keep  T, MD 08/11/2017 9:22 AM

## 2017-08-11 NOTE — Progress Notes (Signed)
Physical Therapy Session Note  Patient Details  Name: Brandon [name] MRN: [id number] Date of Birth: October 29, 1946  Today's Date: 08/11/2017 PT Individual Time: 0800-0900 PT Individual Time Calculation (min): 60 min   Short Term Goals: Week 1:  PT Short Term Goal 1 (Week 1): Pt will perform sit to stand with min assist and min cues  PT Short Term Goal 2 (Week 1): Pt will navigate up/down 8, 3" stairs with min assist PT Short Term Goal 3 (Week 1): Pt will ambulate 80 ft with RW and min assist PT Short Term Goal 4 (Week 1): Pt will supine<>sit with supervision and min cues   Skilled Therapeutic Interventions/Progress Updates:    Pt received sitting up in wheelchar, agreeable to therapy. Wife present for therapy session. Pt min assist/suprvision for sit<>stand throughout session. Pt ambulated 45 ft then 120 ft with RW and min guard. Reported feeling "dizzy". Vitals below. Pt performed static standing balance with UE beading task with min guard. Occasional support from 1 UE. Progressed to staggered stance. Pt able to stand ~1 min before needing rest break. Pt performed 3 sets, 5 reps of step ups onto 2" step. Pt limited by fatigue. Pt returned to room total assist. Left sitting up in chair, all needs in reach, quick release belt on. Wife present.   Therapy Documentation Precautions:  Precautions Precautions: Shoulder, Fall Precaution Comments: Per Dr Tamera Punt (pt s/p Lt RCR early June) NWB Lt UE except to assist with standing and able to use RW; No pulling or pushing with Lt UE;  unrestricted PROM/AROM is okay.  No strengthening   Restrictions Weight Bearing Restrictions: No LUE Weight Bearing: Non weight bearing  Vital Signs: BP after 45 ft walk: 118/54 BP after 120 ft walk: 127/54  See Function Navigator for Current Functional Status.   Therapy/Group: Individual Therapy  Gwinda Passe, SPT 08/11/2017, 8:34 AM

## 2017-08-11 NOTE — Progress Notes (Signed)
Occupational Therapy Session Note  Patient Details  Name: Brandon [name] MRN: [id number] Date of Birth: October 01, 1946  Today's Date: 08/11/2017 OT Individual Time: 0700-0730 OT Individual Time Calculation (min): 30 min    Short Term Goals: Week 1:  OT Short Term Goal 1 (Week 1): Pt will be able to transfer on and off toilet with min A. OT Short Term Goal 2 (Week 1): Pt will bathe self with min A. OT Short Term Goal 3 (Week 1): Pt will don shirt with supervision. OT Short Term Goal 4 (Week 1): Pt will don pants with min A. OT Short Term Goal 5 (Week 1): Pt will demonstrate improved attention to ADL task for at least 5 minutes at a time.   Skilled Therapeutic Interventions/Progress Updates:     1:1. No pain reported. Pt required increased time to arouse for session. OT using verbal cues, tactile cues and cold washcloth to arouse. Pt supine>EOB with VC for technique to push up from sidelying with MIN A for trunk elevation. Pt sits EOB to put in hearing aides. Pt stand pivot transfer with MOD A for lifting from low bed. Pt stands at sink to complete oral/hair care and wash face with 1 seated rest break with min A for balance. Pt completes toilet transfer with RW with VC for RW management and hand placement throughout transfer. Exited session with wife present in room, call light in reach and QRB donned.  Therapy Documentation Precautions:  Precautions Precautions: Shoulder, Fall Precaution Comments: Per Dr Tamera Punt (pt s/p Lt RCR early June) NWB Lt UE except to assist with standing and able to use RW; No pulling or pushing with Lt UE;  unrestricted PROM/AROM is okay.  No strengthening   Restrictions Weight Bearing Restrictions: No LUE Weight Bearing: Non weight bearing  See Function Navigator for Current Functional Status.   Therapy/Group: Individual Therapy  [name] 08/11/2017, 7:27 AM

## 2017-08-11 NOTE — Progress Notes (Signed)
Occupational Therapy Session Note  Patient Details  Name: Brandon [name] MRN: [id number] Date of Birth: 09/03/1946  Today's Date: 08/11/2017 OT Individual Time: 0930-1030 OT Individual Time Calculation (min): 60 min    Short Term Goals: Week 1:  OT Short Term Goal 1 (Week 1): Pt will be able to transfer on and off toilet with min A. OT Short Term Goal 2 (Week 1): Pt will bathe self with min A. OT Short Term Goal 3 (Week 1): Pt will don shirt with supervision. OT Short Term Goal 4 (Week 1): Pt will don pants with min A. OT Short Term Goal 5 (Week 1): Pt will demonstrate improved attention to ADL task for at least 5 minutes at a time.   Skilled Therapeutic Interventions/Progress Updates:    Pt resting in w/c upon arrival with wife present.  Initial focus on BADL retraining including bathing at shower level and dressing with sit<>stand from w/c.  Pt amb with RW to bathroom (steady A) and transferred to tub bench.  Pt required max verbal cues for sequencing and management of hand-held shower.  Pt completed dressing tasks with steady A for standing balance.  Pt transitioned to colored peg board tasks with simple alternating color pattern.  Pt required max verbal cues to complete the task correctly.  Pt remained in w/c with QRB in place with wife present.   Therapy Documentation Precautions:  Precautions Precautions: Shoulder, Fall Precaution Comments: Per Dr Tamera Punt (pt s/p Lt RCR early June) NWB Lt UE except to assist with standing and able to use RW; No pulling or pushing with Lt UE;  unrestricted PROM/AROM is okay.  No strengthening   Restrictions Weight Bearing Restrictions: No LUE Weight Bearing: Non weight bearing Pain: Pain Assessment Pain Assessment: No/denies pain  See Function Navigator for Current Functional Status.   Therapy/Group: Individual Therapy  Leroy Libman 08/11/2017, 10:37 AM

## 2017-08-12 ENCOUNTER — Inpatient Hospital Stay (HOSPITAL_COMMUNITY): Payer: Medicare HMO | Admitting: Occupational Therapy

## 2017-08-12 ENCOUNTER — Inpatient Hospital Stay (HOSPITAL_COMMUNITY): Payer: Medicare HMO | Admitting: Speech Pathology

## 2017-08-12 ENCOUNTER — Inpatient Hospital Stay (HOSPITAL_COMMUNITY): Payer: Medicare HMO | Admitting: Physical Therapy

## 2017-08-12 DIAGNOSIS — I69391 Dysphagia following cerebral infarction: Secondary | ICD-10-CM

## 2017-08-12 DIAGNOSIS — Z8673 Personal history of transient ischemic attack (TIA), and cerebral infarction without residual deficits: Secondary | ICD-10-CM

## 2017-08-12 DIAGNOSIS — N183 Chronic kidney disease, stage 3 unspecified: Secondary | ICD-10-CM

## 2017-08-12 DIAGNOSIS — I5033 Acute on chronic diastolic (congestive) heart failure: Secondary | ICD-10-CM

## 2017-08-12 DIAGNOSIS — D638 Anemia in other chronic diseases classified elsewhere: Secondary | ICD-10-CM

## 2017-08-12 LAB — BASIC METABOLIC PANEL
ANION GAP: 7 (ref 5–15)
BUN: 29 mg/dL — ABNORMAL HIGH (ref 6–20)
CO2: 26 mmol/L (ref 22–32)
Calcium: 8.6 mg/dL — ABNORMAL LOW (ref 8.9–10.3)
Chloride: 103 mmol/L (ref 101–111)
Creatinine, Ser: 1.45 mg/dL — ABNORMAL HIGH (ref 0.61–1.24)
GFR, EST AFRICAN AMERICAN: 54 mL/min — AB (ref >60)
GFR, EST NON AFRICAN AMERICAN: 47 mL/min — AB (ref >60)
GLUCOSE: 99 mg/dL (ref 65–99)
POTASSIUM: 4.4 mmol/L (ref 3.5–5.1)
Sodium: 136 mmol/L (ref 135–145)

## 2017-08-12 LAB — CBC
HEMATOCRIT: 29.8 % — AB (ref 39.0–52.0)
HEMOGLOBIN: 9.3 g/dL — AB (ref 13.0–17.0)
MCH: 30.4 pg (ref 26.0–34.0)
MCHC: 31.2 g/dL (ref 30.0–36.0)
MCV: 97.4 fL (ref 78.0–100.0)
Platelets: 465 10*3/uL — ABNORMAL HIGH (ref 150–400)
RBC: 3.06 MIL/uL — AB (ref 4.22–5.81)
RDW: 18.5 % — ABNORMAL HIGH (ref 11.5–15.5)
WBC: 9.4 10*3/uL (ref 4.0–10.5)

## 2017-08-12 LAB — GLUCOSE, CAPILLARY
GLUCOSE-CAPILLARY: 112 mg/dL — AB (ref 65–99)
GLUCOSE-CAPILLARY: 113 mg/dL — AB (ref 65–99)
Glucose-Capillary: 109 mg/dL — ABNORMAL HIGH (ref 65–99)
Glucose-Capillary: 120 mg/dL — ABNORMAL HIGH (ref 65–99)

## 2017-08-12 NOTE — Progress Notes (Signed)
Speech Language Pathology Daily Session Note  Patient Details  Name: Brandon [name] MRN: [id number] Date of Birth: 18-Oct-1946  Today's Date: 08/12/2017 SLP Individual Time: 0915-1000    Short Term Goals: Week 1: SLP Short Term Goal 1 (Week 1): Pt will demonstrate sustained attention to functional task for 5 minutes with Mod multimodal cues. SLP Short Term Goal 2 (Week 1): Pt will demonstrate intellectual awareness and identify 2 physical and 2 cognitive deficits given Mod verbal cues.  SLP Short Term Goal 3 (Week 1): Pt will complete basic, familar tasks with Mod verbal and question cues for functional problem solving. SLP Short Term Goal 4 (Week 1): Pt will consume ice chip trials without overt s/s aspiration over 3 consective sessions in order to determine readiness for instrumental study.  SLP Short Term Goal 5 (Week 1): Pt will increase vocal intensity at phrase level to achieve 80% intelligibilty with Mod verbal cues.  SLP Short Term Goal 6 (Week 1): Pt will self-monitor and  self-correct verbal and fucntional errors with Max [name] multimodal cues.   Skilled Therapeutic Interventions: Skilled treatment session focused on dysphagia and cognition goals. SLP facilitated session by providing skilled observation of pt consuming thin water via cup sips. Pt with no overt s/s of aspiration. Pt appears ready for repeat instrumental study. Will attempt at earliest available time. SLP further facilitated session by providing Mod [name] cues to use EMST device. After Mod [name] cues, pt able to complete 5 reps x 1 set with device set at 30 cm H2O with perceived self-exertion of 3 out of 10. Score likely impacted by cognitive deficits. Pt required more than [name] reasonable amount of time to organize and complete simple medication management task. Pt specifically required cues for task organization (i.e., not skipping to different medicines). Pt also benefited from Brandon [name] for sustained attention and to attempt answers himself  instead of relying on wife. Education provided to wife to decrease verbal support when pt is engaging in therapy activities. Pt was returned to room, left upright in wheelchair with wife present. Continue per current plan of care.      Function:  Eating Eating   Modified Consistency Diet: No (Trials with SLP) Eating Assist Level: Supervision or verbal cues           Cognition Comprehension Comprehension assist level: Understands basic 75 - 89% of the time/ requires cueing 10 - 24% of the time;Understands basic 50 - 74% of the time/ requires cueing 25 - 49% of the time  Expression   Expression assist level: Expresses basic 75 - 89% of the time/requires cueing 10 - 24% of the time. Needs helper to occlude trach/needs to repeat words.  Social Interaction Social Interaction assist level: Interacts appropriately 75 - 89% of the time - Needs redirection for appropriate language or to initiate interaction.  Problem Solving Problem solving assist level: Solves basic 75 - 89% of the time/requires cueing 10 - 24% of the time;Solves basic 50 - 74% of the time/requires cueing 25 - 49% of the time  Memory Memory assist level: Recognizes or recalls 25 - 49% of the time/requires cueing 50 - 75% of the time    Pain    Therapy/Group: Individual Therapy  Brandon [name] 08/12/2017, 3:31 PM

## 2017-08-12 NOTE — Progress Notes (Signed)
Physical Therapy Session Note  Patient Details  Name: Brandon [name] MRN: [id number] Date of Birth: [birth year]  Today's Date: 08/12/2017 PT Individual Time: 1300-1415 PT Individual Time Calculation (min): 75 min   Short Term Goals: Week 1:  PT Short Term Goal 1 (Week 1): Pt will perform sit to stand with min assist and min cues  PT Short Term Goal 2 (Week 1): Pt will navigate up/down 8, 3" stairs with min assist PT Short Term Goal 3 (Week 1): Pt will ambulate 80 ft with RW and min assist PT Short Term Goal 4 (Week 1): Pt will supine<>sit with supervision and min cues   Skilled Therapeutic Interventions/Progress Updates:   Pt received sitting in WC and agreeable to PT  Prior to PT treatment: BP 121/71.  Nustep endurance and reciprocal  training 5 min with RPE 14/20. BP assessed 135/64 additional 5 minutes on nustep RPE increased to 15/20. BP 127/61.   Gait training instructed by PT x 167f with RW and close supervision assist. Min cues for increased BOS and step width. BP assessed following gait. 112/56. 4 minute rest break BP re-assessed: 99/56. 3 minute additional rest break: BP increased to 124/57.   Additional gait training x 1015fwith supervision assist and RW. No signs of orthostasis.   Reciprocal foot taps on 6 inch step x 10 BLE.  Step up/down 6 inch step with BUE support  X 5 with R LE and x 5 with LLE.  Min assist from PT on steps, with no signs of orthostasis.   [facility] returned too room and [facility] and all needs met. BP assessed following treatment with 132/98  PT assessed BP throughout treatment due reports of orthostasis earlier in the Week.        Therapy Documentation Precautions:  Precautions Precautions: Shoulder, Fall Precaution Comments: Per Dr ChTamera Puntpt s/p Lt RCR early June) NWB Lt UE except to assist with standing and able to use RW; No pulling or pushing with Lt UE;  unrestricted PROM/AROM is okay.  No  strengthening   Restrictions Weight Bearing Restrictions: Yes LUE Weight Bearing: Non weight bearing Pain: denies.   See Function Navigator for Current Functional Status.   Therapy/Group: Individual Therapy  AuLorie Phenix/12/2016, 2:20 PM

## 2017-08-12 NOTE — Progress Notes (Signed)
[place] [facility]IC[facility] & REHABILITATION     PROGRESS NOTE  Subjective/Complaints: Patient seen lying in bed this morning her sleep chart patient's slept late, but slept well afterward.  ROS: Denies nausea, vomiting, diarrhea, shortness of breath or chest pain   Objective: Vital Signs: Blood pressure (!) 116/51, pulse 77, temperature 98.3 F (36.8 C), temperature source Oral, resp. rate 18, height 5\' 9"  (1.753 m), weight 97.5 kg (215 lb), SpO2 98 %. No results found.  Recent Labs  08/12/17 0613  WBC 9.4  HGB 9.3*  HCT 29.8*  PLT 465*    Recent Labs  08/11/17 0458  NA 137  K 4.2  CL 106  GLUCOSE 112*  BUN 32*  CREATININE 1.40*  CALCIUM 8.8*   CBG (last 3)   Recent Labs  08/11/17 1646 08/11/17 2131 08/12/17 0632  GLUCAP 107* 134* 112*    Wt Readings from Last 3 Encounters:  08/11/17 97.5 kg (215 lb)  08/07/17 101.9 kg (224 lb 9.6 oz)  05/18/17 114.3 kg (252 lb)    Physical Exam:   Constitutional: He appears well-developed. No distress Obese  HENT: Normocephalic, atraumatic  Eyes: EOMI. No discharge. Cardiovascular: RRR. No JVD     Respiratory: CTA Bilaterally. Normal effort  GI: BS+, ND.  Musculoskeletal: He exhibits trace peripheral edema. He exhibits no tenderness.  Neurological: He is alert, disoriented.  HOH.  Motor: RUE: 3+/5 right deltoid, bicep, tricep, hand grip Left upper extremity: 4-/5 strength in the deltoid, bicep, tricep, hand grip Lower extremity:  4-/5 at the hip flexors, knee extensors, ankle dorsiflexors.  Skin: Skin is warm and dry.  Psychiatric: pleasant and cooperative  Assessment/Plan: 1. Functional deficits secondary to debility/prior CVA which require 3+ hours per day of interdisciplinary therapy in a comprehensive inpatient rehab setting. Physiatrist is providing close team supervision and 24 hour management of active medical problems listed below. Physiatrist and rehab team continue to assess barriers to  discharge/monitor patient progress toward functional and medical goals.  Function:  Bathing Bathing position   Position: Shower  Bathing parts Body parts bathed by patient: Right arm, Left arm, Chest, Abdomen, Front perineal area, Buttocks, Right upper leg, Left upper leg Body parts bathed by helper: Right lower leg, Left lower leg, Back  Bathing assist Assist Level: Touching or steadying assistance(Pt > 75%)      Upper Body Dressing/Undressing Upper body dressing   What is the patient wearing?: Pull over shirt/dress     Pull over shirt/dress - Perfomed by patient: Thread/unthread right sleeve, Thread/unthread left sleeve, Put head through opening, Pull shirt over trunk Pull over shirt/dress - Perfomed by helper: Pull shirt over trunk        Upper body assist Assist Level: Supervision or verbal cues      Lower Body Dressing/Undressing Lower body dressing   What is the patient wearing?: Pants, Non-skid slipper socks, Ted Hose     Pants- Performed by patient: Thread/unthread right pants leg, Thread/unthread left pants leg, Pull pants up/down Pants- Performed by helper: Thread/unthread right pants leg, Thread/unthread left pants leg, Pull pants up/down Non-skid slipper socks- Performed by patient: Don/doff right sock, Don/doff left sock Non-skid slipper socks- Performed by helper: Don/doff right sock, Don/doff left sock               TED Hose - Performed by helper: Don/doff right TED hose, Don/doff left TED hose  Lower body assist        Toileting Toileting     Toileting steps  completed by helper: Adjust clothing prior to toileting, Performs perineal hygiene, Adjust clothing after toileting    Toileting assist Assist level: Two helpers (per [employer], NT)   Transfers Chair/bed transfer   Chair/bed transfer method: Stand pivot Chair/bed transfer assist level: Moderate assist (Pt 50 - 74%/lift or lower) Chair/bed transfer assistive device: Armrests      Locomotion Ambulation     Max distance: 120 Assist level: Touching or steadying assistance (Pt > 75%)   Wheelchair          Cognition Comprehension Comprehension assist level: Understands basic 50 - 74% of the time/ requires cueing 25 - 49% of the time  Expression Expression assist level: Expresses basic 75 - 89% of the time/requires cueing 10 - 24% of the time. Needs helper to occlude trach/needs to repeat words.  Social Interaction Social Interaction assist level: Interacts appropriately 75 - 89% of the time - Needs redirection for appropriate language or to initiate interaction.  Problem Solving Problem solving assist level: Solves basic 25 - 49% of the time - needs direction more than half the time to initiate, plan or complete simple activities  Memory Memory assist level: Recognizes or recalls less than 25% of the time/requires cueing greater than 75% of the time    Medical Problem List and Plan: 1.  Debilitation and right upper extremity weakness due to history of CVA/acute hypoxemic respiratory failure. Patient was extubated 07/25/2017  -continue CIR 2.  DVT Prophylaxis/Anticoagulation: Eliquis. Monitor for any increased bleeding episodes 3. Pain Management/chronic back pain: Tylenol as needed. Patient on oxycodone 5-325mg  1 to 2 tabs every 4 hours as needed prior to admission. Resume as needed  -limit given encephalopathy 4. Mood/delirium: Provide emotional support. Patient on Wellbutrin 150 mg twice a day as well as Celexa 20 mg daily prior to admission.   -resumed celexa 10mg  QHS  -sleep chart ongoing  -continue scheduled trazodone 5. Neuropsych: This patient is not capable of making decisions on his own behalf. 6. Skin/Wound Care: Routine skin checks 7. Fluids/Electrolytes/Nutrition: encourage fluids  -continue to encourage fluids 8. Acute on chronic diastolic congestive heart failure.   -holding lasix  -TEDS/abdominal binder Filed Weights   08/09/17 0344 08/10/17  0504 08/11/17 1945  Weight: 99.3 kg (219 lb) 95.7 kg (211 lb) 97.5 kg (215 lb)   - Follow-up per cardiology services as needed 9. Atrial fibrillation. Continue Eliquis. Amiodarone 200 mg twice a day 10. Dysphagia. Dysphagia #2 nectar liquids after MBS 08/07/2017.   -protein supp  -encourage fluids  - increase megace to bid 11. History of CVA. Follow-up per neurology services and advised to continue eliquis. 12. Recent left rotator cuff surgery. Per Dr. Tamera Punt nonweightbearing left upper extremity except to assist with standing and able to use rolling walker. No pulling or pushing with left upper extremity. Unrestricted passive range of motion active range of motion. No strengthening restrictions 13. Acute on chronic anemia. Latest transfusion 08/07/2017.  hgb 9.3 on 9/1 14. COPD/tobacco abuse. Counseling. Check oxygen saturations every shift 15. CKD  Cr 1.40 on 8/31  Cont to monitor  LOS (Days) 5 A FACE TO FACE EVALUATION WAS PERFORMED  Abiola Behring Lorie Phenix, MD 08/12/2017 9:49 AM

## 2017-08-12 NOTE — Progress Notes (Signed)
Occupational Therapy Session Note  Patient Details  Name: Brandon [name] MRN: [id number] Date of Birth: 09-18-1946  Today's Date: 08/12/2017 OT Individual Time: 1101-1158 OT Individual Time Calculation (min): 57 min    Short Term Goals: Week 1:  OT Short Term Goal 1 (Week 1): Pt will be able to transfer on and off toilet with min A. OT Short Term Goal 2 (Week 1): Pt will bathe self with min A. OT Short Term Goal 3 (Week 1): Pt will don shirt with supervision. OT Short Term Goal 4 (Week 1): Pt will don pants with min A. OT Short Term Goal 5 (Week 1): Pt will demonstrate improved attention to ADL task for at least 5 minutes at a time.      Skilled Therapeutic Interventions/Progress Updates:    Tx focus on balance, Lt UE AROM, cognitive remediation, and psychosocial wellbeing during meaningful leisure engagement.   Pt greeted in w/c with family present, ready for tx. Pt escorted to dayroom. He engaged in skeet shooting activity while sitting and standing with RW as tolerated. Mod A standing balance with manual facilitation of anterior weight shift. New learning demands with Wii to facilitate improved attention and problem solving.  Pt requiring Min A and mod cues to follow basic instruction. OT assist required for pressing correct controls while pt aimed with plastic rifle, bilaterally integrating UEs or interchanging between [place] and Rt. Longest standing time without rest 3 minutes. Pt then requested water. Water protocol completed and results recorded on blue sheet in room. At end of tx pt was escorted back to room (able to pathfind with extra time). He was left with all needs within reach and son present at time of departure.   Pt very HOH and OT needed to stand in front of him when speaking so he could lip read during session.   Therapy Documentation Precautions:  Precautions Precautions: Shoulder, Fall Precaution Comments: Per Dr Tamera Punt (pt s/p Lt RCR early June) NWB Lt UE except to  assist with standing and able to use RW; No pulling or pushing with Lt UE;  unrestricted PROM/AROM is okay.  No strengthening   Restrictions Weight Bearing Restrictions: Yes LUE Weight Bearing: Non weight bearing Pain: No c/o pain during tx    ADL: ADL ADL Comments: refer to functional navigator     See Function Navigator for Current Functional Status.   Therapy/Group: Individual Therapy  Ezequias Lard A Shaquira Moroz 08/12/2017, 12:23 PM

## 2017-08-13 ENCOUNTER — Inpatient Hospital Stay (HOSPITAL_COMMUNITY): Payer: Medicare HMO

## 2017-08-13 LAB — GLUCOSE, CAPILLARY
Glucose-Capillary: 102 mg/dL — ABNORMAL HIGH (ref 65–99)
Glucose-Capillary: 112 mg/dL — ABNORMAL HIGH (ref 65–99)
Glucose-Capillary: 117 mg/dL — ABNORMAL HIGH (ref 65–99)
Glucose-Capillary: 131 mg/dL — ABNORMAL HIGH (ref 65–99)

## 2017-08-13 NOTE — Progress Notes (Signed)
[facility] [facility] PHYSICAL MEDICINE & REHABILITATION     PROGRESS NOTE  Subjective/Complaints: Patient seen lying in bed this morning. Sleep chart not updated. He states he slept well.  ROS: Denies nausea, vomiting, diarrhea, shortness of breath or chest pain   Objective: Vital Signs: Blood pressure (!) 130/55, pulse 77, temperature 98.2 F (36.8 C), temperature source Oral, resp. rate 18, height 5\' 9"  (1.753 m), weight 98 kg (216 lb), SpO2 97 %. No results found.  Recent Labs  08/12/17 0613  WBC 9.4  HGB 9.3*  HCT 29.8*  PLT 465*    Recent Labs  08/11/17 0458 08/12/17 0613  NA 137 136  K 4.2 4.4  CL 106 103  GLUCOSE 112* 99  BUN 32* 29*  CREATININE 1.40* 1.45*  CALCIUM 8.8* 8.6*   CBG (last 3)   Recent Labs  08/12/17 1631 08/12/17 2042 08/13/17 0636  GLUCAP 120* 113* 102*    Wt Readings from Last 3 Encounters:  08/13/17 98 kg (216 lb)  08/07/17 101.9 kg (224 lb 9.6 oz)  05/18/17 114.3 kg (252 lb)    Physical Exam:   Constitutional: He appears well-developed. No distress Obese  HENT: Normocephalic, atraumatic  Eyes: EOMI. No discharge. Cardiovascular: RRR. No JVD     Respiratory: CTA Bilaterally. Normal effort  GI: BS+, ND.  Musculoskeletal: He exhibits trace peripheral edema. He exhibits no tenderness.  Neurological: He is alert, disoriented.  HOH.  Motor: RUE: 3+/5 right deltoid, bicep, tricep, hand grip Left upper extremity: 4-/5 strength in the deltoid, bicep, tricep, hand grip (stable) Left lower extremity:  4-/5 at the hip flexors, knee extensors, ankle dorsiflexors.  Skin: Skin is warm and dry.  Psychiatric: pleasant and cooperative  Assessment/Plan: 1. Functional deficits secondary to debility/prior CVA which require 3+ hours per day of interdisciplinary therapy in a comprehensive inpatient rehab setting. Physiatrist is providing close team supervision and 24 hour management of active medical problems listed below. Physiatrist and rehab team  continue to assess barriers to discharge/monitor patient progress toward functional and medical goals.  Function:  Bathing Bathing position Bathing activity did not occur: Refused Position: Production manager parts bathed by patient: Right arm, Left arm, Chest, Abdomen, Front perineal area, Buttocks, Right upper leg, Left upper leg Body parts bathed by helper: Right lower leg, Left lower leg, Back  Bathing assist Assist Level: Touching or steadying assistance(Pt > 75%)      Upper Body Dressing/Undressing Upper body dressing   What is the patient wearing?: Pull over shirt/dress     Pull over shirt/dress - Perfomed by patient: Thread/unthread right sleeve, Thread/unthread left sleeve, Put head through opening, Pull shirt over trunk Pull over shirt/dress - Perfomed by helper: Pull shirt over trunk        Upper body assist Assist Level: Supervision or verbal cues      Lower Body Dressing/Undressing Lower body dressing   What is the patient wearing?: Pants, Non-skid slipper socks, Ted Hose     Pants- Performed by patient: Thread/unthread right pants leg, Thread/unthread left pants leg, Pull pants up/down Pants- Performed by helper: Thread/unthread right pants leg, Thread/unthread left pants leg, Pull pants up/down Non-skid slipper socks- Performed by patient: Don/doff right sock, Don/doff left sock Non-skid slipper socks- Performed by helper: Don/doff right sock, Don/doff left sock               TED Hose - Performed by helper: Don/doff right TED hose, Don/doff left TED hose  Lower body assist  Toileting Toileting     Toileting steps completed by helper: Adjust clothing prior to toileting, Performs perineal hygiene, Adjust clothing after toileting    Toileting assist Assist level: Two helpers (per [employer], NT)   Transfers Chair/bed transfer   Chair/bed transfer method: Stand pivot Chair/bed transfer assist level: Moderate assist (Pt 50 - 74%/lift  or lower) Chair/bed transfer assistive device: Armrests     Locomotion Ambulation     Max distance: 130 Assist level: Supervision or verbal cues   Wheelchair          Cognition Comprehension Comprehension assist level: Understands basic 75 - 89% of the time/ requires cueing 10 - 24% of the time, Understands basic 50 - 74% of the time/ requires cueing 25 - 49% of the time  Expression Expression assist level: Expresses basic 75 - 89% of the time/requires cueing 10 - 24% of the time. Needs helper to occlude trach/needs to repeat words.  Social Interaction Social Interaction assist level: Interacts appropriately 75 - 89% of the time - Needs redirection for appropriate language or to initiate interaction.  Problem Solving Problem solving assist level: Solves basic 75 - 89% of the time/requires cueing 10 - 24% of the time, Solves basic 50 - 74% of the time/requires cueing 25 - 49% of the time  Memory Memory assist level: Recognizes or recalls 25 - 49% of the time/requires cueing 50 - 75% of the time    Medical Problem List and Plan: 1.  Debilitation and right upper extremity weakness due to history of CVA/acute hypoxemic respiratory failure. Patient was extubated 07/25/2017  -continue CIR 2.  DVT Prophylaxis/Anticoagulation: Eliquis. Monitor for any increased bleeding episodes 3. Pain Management/chronic back pain: Tylenol as needed. Patient on oxycodone 5-325mg  1 to 2 tabs every 4 hours as needed prior to admission. Resume as needed  -limit given encephalopathy 4. Mood/delirium: Provide emotional support. Patient on Wellbutrin 150 mg twice a day as well as Celexa 20 mg daily prior to admission.   -resumed celexa 10mg  QHS  -sleep chart ongoing  -continue scheduled trazodone 5. Neuropsych: This patient is not capable of making decisions on his own behalf. 6. Skin/Wound Care: Routine skin checks 7. Fluids/Electrolytes/Nutrition: encourage fluids  -continue to encourage fluids 8. Acute on  chronic diastolic congestive heart failure.   -holding lasix  -TEDS/abdominal binder Filed Weights   08/10/17 0504 08/11/17 1945 08/13/17 0500  Weight: 95.7 kg (211 lb) 97.5 kg (215 lb) 98 kg (216 lb)   - Follow-up per cardiology services as needed  ?Trending up, will cont to monitor, will consider restarting Lasix tomorrow. 9. Atrial fibrillation. Continue Eliquis. Amiodarone 200 mg twice a day 10. Dysphagia. Dysphagia #2 nectar liquids after MBS 08/07/2017.   -protein supp  -encourage fluids  - increase megace to bid 11. History of CVA. Follow-up per neurology services and advised to continue eliquis. 12. Recent left rotator cuff surgery. Per Dr. Tamera Punt nonweightbearing left upper extremity except to assist with standing and able to use rolling walker. No pulling or pushing with left upper extremity. Unrestricted passive range of motion active range of motion. No strengthening restrictions 13. Acute on chronic anemia. Latest transfusion 08/07/2017.  hgb 9.3 on 9/1 14. COPD/tobacco abuse. Counseling. Check oxygen saturations every shift 15. CKD  Cr 1.45 on 9/1  Cont to monitor  LOS (Days) 6 A FACE TO FACE EVALUATION WAS PERFORMED  Jearldine Cassady Lorie Phenix, MD 08/13/2017 7:38 AM

## 2017-08-13 NOTE — Progress Notes (Signed)
Occupational Therapy Session Note  Patient Details  Name: Brandon [name] MRN: [id number] Date of Birth: 09-15-1946  Today's Date: 08/13/2017 OT Individual Time: 2060-1561 OT Individual Time Calculation (min): 28 min    Short Term Goals: Week 1:  OT Short Term Goal 1 (Week 1): Pt will be able to transfer on and off toilet with min A. OT Short Term Goal 2 (Week 1): Pt will bathe self with min A. OT Short Term Goal 3 (Week 1): Pt will don shirt with supervision. OT Short Term Goal 4 (Week 1): Pt will don pants with min A. OT Short Term Goal 5 (Week 1): Pt will demonstrate improved attention to ADL task for at least 5 minutes at a time.   Skilled Therapeutic Interventions/Progress Updates:    1:1. No c/o pain. Wife requesting education on donning ted hose. OT demonstrated using plastic bag to help don ted hose over pt feet. Wife able to return demonstrate. Pt dons B socks by assuming seated figure four. Pt stands ~15 min with 4 seated rest breaks to assemble 50% of puzzle with supervision and min question cues to identify strategies (sorting pieces, referring to picture etc.) to make assembling puzzle easier. Exited session with pt seated in w/c with wife present and call light in reach.   Therapy Documentation Precautions:  Precautions Precautions: Shoulder, Fall Precaution Comments: Per Dr Tamera Punt (pt s/p Lt RCR early June) NWB Lt UE except to assist with standing and able to use RW; No pulling or pushing with Lt UE;  unrestricted PROM/AROM is okay.  No strengthening   Restrictions Weight Bearing Restrictions: No LUE Weight Bearing: Non weight bearing  See Function Navigator for Current Functional Status.   Therapy/Group: Individual Therapy  [name] 08/13/2017, 12:16 PM

## 2017-08-14 ENCOUNTER — Inpatient Hospital Stay (HOSPITAL_COMMUNITY): Payer: Medicare HMO

## 2017-08-14 ENCOUNTER — Inpatient Hospital Stay (HOSPITAL_COMMUNITY): Payer: Medicare HMO | Admitting: Speech Pathology

## 2017-08-14 ENCOUNTER — Inpatient Hospital Stay (HOSPITAL_COMMUNITY): Payer: Medicare HMO | Admitting: Occupational Therapy

## 2017-08-14 LAB — GLUCOSE, CAPILLARY
GLUCOSE-CAPILLARY: 114 mg/dL — AB (ref 65–99)
GLUCOSE-CAPILLARY: 123 mg/dL — AB (ref 65–99)
GLUCOSE-CAPILLARY: 106 mg/dL — AB (ref 65–99)
Glucose-Capillary: 122 mg/dL — ABNORMAL HIGH (ref 65–99)

## 2017-08-14 MED ORDER — FUROSEMIDE 20 MG PO TABS
20.0000 mg | ORAL_TABLET | Freq: Every day | ORAL | Status: DC
  Administered 2017-08-14 – 2017-08-15 (×2): 20 mg via ORAL
  Filled 2017-08-14: qty 1

## 2017-08-14 NOTE — Progress Notes (Signed)
Restless night. Scheduled trazodone given at 2048. At 2230, complained of nausea, declined med. Lots of belching and gas. At 0032, PRN Ativan given, not effective. Brandon [name] A

## 2017-08-14 NOTE — Progress Notes (Signed)
Physical Therapy Note  Patient Details  Name: Brandon [name] MRN: [id number] Date of Birth: Jul 17, 1946 Today's Date: 08/14/2017  1305-1415, 70 min individual tx Pain: 2/10 LBP, K pad in room  Stand pivot w/c> mat with min assist, cues for hand placement.  Scooting L><R on mat to encourage forward wt shift.  Bobath method to stand, x 5 with rest after 3rd trial. Pt stated he felt slightly dizzy; BP in sitting = 141/59, HR 80.   Gait with RW on level tile x 80' with min guard assist. Pt noted to have narrow BOS with LLE crossing over slightly at times.  Attempted dynamic hip abduction in standing, with bil UE support, but pt's LBP increased.  Seated bil hip abduction using bright green theraband x 10 reps. Gait with RW up/down 6" step/curb and over mulched area, with min assist, mod/max cues,  as pt is HOH and attempts to move before understanding instructions. Standing R/L hip abduction with lateral body support against countertop, x 5 each before fatiguing, and standing calf raises.with frontal support against cabinet, 10 x 1 .  Pt left resting in w/c with wife in attendance with all needs within reach. K pad reapplied to back.  Dripping noted from K pad reservoir; wife stated she was going to notify NSG.   See function navigator for current status.   Phillip Maffei 08/14/2017, 1:30 PM

## 2017-08-14 NOTE — Progress Notes (Signed)
[place] PHYSICAL MEDICINE & REHABILITATION     PROGRESS NOTE  Subjective/Complaints: Low back bothering him. Otherwise feeling well. No problems breathing.   ROS: pt denies nausea, vomiting, diarrhea, cough, shortness of breath or chest pain   Vital Signs: Blood pressure 134/86, pulse 88, temperature 98.1 F (36.7 C), temperature source Oral, resp. rate 16, height 5\' 9"  (1.753 m), weight 97.9 kg (215 lb 12.5 oz), SpO2 100 %. No results found.  Recent Labs  08/12/17 0613  WBC 9.4  HGB 9.3*  HCT 29.8*  PLT 465*    Recent Labs  08/12/17 0613  NA 136  K 4.4  CL 103  GLUCOSE 99  BUN 29*  CREATININE 1.45*  CALCIUM 8.6*   CBG (last 3)   Recent Labs  08/13/17 1623 08/13/17 2101 08/14/17 0649  GLUCAP 117* 131* 114*    Wt Readings from Last 3 Encounters:  08/14/17 97.9 kg (215 lb 12.5 oz)  08/07/17 101.9 kg (224 lb 9.6 oz)  05/18/17 114.3 kg (252 lb)    Physical Exam:   Constitutional: He appears well-developed. No distress Obese  HENT: Normocephalic, atraumatic  Eyes: EOMI. No discharge. Cardiovascular: RRR without murmur. No JVD     Respiratory: CTA Bilaterally without wheezes or rales. Normal effort  GI: BS+, ND.  Musculoskeletal: He exhibits trace peripheral edema. He exhibits no tenderness.  Neurological: He is alert, disoriented.  HOH.  Motor: RUE: 3+/5 right deltoid, bicep, tricep, hand grip Left upper extremity: 4-/5 strength in the deltoid, bicep, tricep, hand grip (stable) Left lower extremity:  4-/5 at the hip flexors, knee extensors, ankle dorsiflexors.  Skin: Skin is warm and dry.  Psychiatric: pleasant and cooperative  Assessment/Plan: 1. Functional deficits secondary to debility/prior CVA which require 3+ hours per day of interdisciplinary therapy in a comprehensive inpatient rehab setting. Physiatrist is providing close team supervision and 24 hour management of active medical problems listed below. Physiatrist and rehab team continue to  assess barriers to discharge/monitor patient progress toward functional and medical goals.  Function:  Bathing Bathing position Bathing activity did not occur: Refused Position: Production manager parts bathed by patient: Right arm, Left arm, Chest, Abdomen, Front perineal area, Buttocks, Right upper leg, Left upper leg Body parts bathed by helper: Right lower leg, Left lower leg, Back  Bathing assist Assist Level: Touching or steadying assistance(Pt > 75%)      Upper Body Dressing/Undressing Upper body dressing   What is the patient wearing?: Pull over shirt/dress     Pull over shirt/dress - Perfomed by patient: Thread/unthread right sleeve, Thread/unthread left sleeve, Put head through opening, Pull shirt over trunk Pull over shirt/dress - Perfomed by helper: Pull shirt over trunk        Upper body assist Assist Level: Supervision or verbal cues      Lower Body Dressing/Undressing Lower body dressing   What is the patient wearing?: Pants, Non-skid slipper socks, Ted Hose     Pants- Performed by patient: Thread/unthread right pants leg, Thread/unthread left pants leg, Pull pants up/down Pants- Performed by helper: Thread/unthread right pants leg, Thread/unthread left pants leg, Pull pants up/down Non-skid slipper socks- Performed by patient: Don/doff right sock, Don/doff left sock Non-skid slipper socks- Performed by helper: Don/doff right sock, Don/doff left sock               TED Hose - Performed by helper: Don/doff right TED hose, Don/doff left TED hose  Lower body assist  Toileting Toileting     Toileting steps completed by helper: Adjust clothing prior to toileting, Performs perineal hygiene, Adjust clothing after toileting    Toileting assist Assist level: Two helpers (per [employer], NT)   Transfers Chair/bed transfer   Chair/bed transfer method: Stand pivot Chair/bed transfer assist level: Moderate assist (Pt 50 - 74%/lift or  lower) Chair/bed transfer assistive device: Armrests     Locomotion Ambulation     Max distance: 130 Assist level: Supervision or verbal cues   Wheelchair          Cognition Comprehension Comprehension assist level: Understands basic 75 - 89% of the time/ requires cueing 10 - 24% of the time, Understands basic 50 - 74% of the time/ requires cueing 25 - 49% of the time  Expression Expression assist level: Expresses basic 75 - 89% of the time/requires cueing 10 - 24% of the time. Needs helper to occlude trach/needs to repeat words.  Social Interaction Social Interaction assist level: Interacts appropriately 75 - 89% of the time - Needs redirection for appropriate language or to initiate interaction.  Problem Solving Problem solving assist level: Solves basic 75 - 89% of the time/requires cueing 10 - 24% of the time, Solves basic 50 - 74% of the time/requires cueing 25 - 49% of the time  Memory Memory assist level: Recognizes or recalls 25 - 49% of the time/requires cueing 50 - 75% of the time    Medical Problem List and Plan: 1.  Debilitation and right upper extremity weakness due to history of CVA/acute hypoxemic respiratory failure. Patient was extubated 07/25/2017  -continue CIR 2.  DVT Prophylaxis/Anticoagulation: Eliquis. Monitor for any increased bleeding episodes 3. Pain Management/chronic back pain: Tylenol as needed. Patient on oxycodone 5-325mg  1 to 2 tabs every 4 hours as needed prior to admission. Resume as needed  -limit given encephalopathy  -add kpad for back 4. Mood/delirium: Provide emotional support. Patient on Wellbutrin 150 mg twice a day as well as Celexa 20 mg daily prior to admission.   -resumed celexa 10mg  QHS  -sleep chart ongoing  -continue scheduled trazodone 5. Neuropsych: This patient is not capable of making decisions on his own behalf. 6. Skin/Wound Care: Routine skin checks 7. Fluids/Electrolytes/Nutrition: encourage fluids  -continue to encourage  fluids 8. Acute on chronic diastolic congestive heart failure.   -resume low dose lasix at 20mg  as weight sl trending up  -TEDS/abdominal binder Filed Weights   08/11/17 1945 08/13/17 0500 08/14/17 0604  Weight: 97.5 kg (215 lb) 98 kg (216 lb) 97.9 kg (215 lb 12.5 oz)   - Follow-up per cardiology services as needed 9. Atrial fibrillation. Continue Eliquis. Amiodarone 200 mg twice a day 10. Dysphagia. Dysphagia #3 thin liquids after MBS today  -protein supp   -intake better over the weekend  -continue BID megace 11. History of CVA. Follow-up per neurology services and advised to continue eliquis. 12. Recent left rotator cuff surgery. Per Dr. Tamera Punt nonweightbearing left upper extremity except to assist with standing and able to use rolling walker. No pulling or pushing with left upper extremity. Unrestricted passive range of motion active range of motion. No strengthening restrictions 13. Acute on chronic anemia. Latest transfusion 08/07/2017.  hgb 9.3 on 9/1 14. COPD/tobacco abuse. Counseling. Check oxygen saturations every shift 15. CKD  Cr 1.45 on 9/1  Cont to monitor---follow up this week  LOS (Days) 7 A FACE TO FACE EVALUATION WAS PERFORMED  Meredith Staggers, MD 08/14/2017 11:24 AM

## 2017-08-14 NOTE — Progress Notes (Signed)
Modified Barium Swallow Progress Note  Patient Details  Name: Brandon [name] MRN: [id number] Date of Birth: [birth year]  Today's Date: 08/14/2017  Modified Barium Swallow completed.  Full report located under Chart Review in the Imaging Section.  Brief recommendations include the following:  Clinical Impression  Pt continues to show improved oropharyngeal function and mentation since Modified Barium Swallow Study on 08/07/17. Pt with increased bolus cohesion, decreased oral pocketing and was able to orally clear all boluses. Pt with delayed swallow initiation to vallecula on nectar thick, thin liquids via cup or straw, puree and mechanical soft textures. Pt with one episode of deeper penetration during swallow of thin liquids via straw but cleared during swallow. Pt with intermittent cough during study but wasn't related to swallow function. Recommend upgrade to dysphagia 3 with thin liquids via cup and medicine whole with puree. Continue full supervision during PO intake d/t congitive deficits. After study, pt's wife reports that pt frequently coughed at restuarants when he was distracted and drinking from a straw.   Swallow Evaluation Recommendations       SLP Diet Recommendations: Dysphagia 3 (Mech soft) solids;Thin liquid   Liquid Administration via: Cup (NO STRAW)   Medication Administration: Whole meds with puree   Supervision: Patient able to self feed;Full supervision/cueing for compensatory strategies   Compensations: Minimize environmental distractions;Slow rate;Small sips/bites   Postural Changes: Remain semi-upright after after feeds/meals (Comment);Seated upright at 90 degrees   Oral Care Recommendations: Oral care BID        Shenicka Sunderlin 08/14/2017,11:01 AM

## 2017-08-14 NOTE — Progress Notes (Signed)
Occupational Therapy Session Note  Patient Details  Name: Brandon [name] MRN: [id number] Date of Birth: Jun 24, 1946  Today's Date: 08/14/2017 OT Individual Time: 9038-3338 OT Individual Time Calculation (min): 58 min    Short Term Goals: Week 1:  OT Short Term Goal 1 (Week 1): Pt will be able to transfer on and off toilet with min A. OT Short Term Goal 2 (Week 1): Pt will bathe self with min A. OT Short Term Goal 3 (Week 1): Pt will don shirt with supervision. OT Short Term Goal 4 (Week 1): Pt will don pants with min A. OT Short Term Goal 5 (Week 1): Pt will demonstrate improved attention to ADL task for at least 5 minutes at a time.   Skilled Therapeutic Interventions/Progress Updates:    Upon entering the room, pt seated in wheelchair with wife present in room. Pt agreeable to shower this session. Pt ambulates to bathroom with steady assistance and use of RW. Pt removing clothing from seated position on TTB. Pt engaged in bathing from seated position with mod verbal cues for sequence and initiation. Pt exiting the shower and seated in wheelchair for dressing tasks. Pt crossing ankle over opposite knee to don B shoes this session. Pt standing with steady assistance for balance when pulling pants over B hips. Pt standing at sink for grooming tasks with encouragement and steady assistance for balance. Pt drinking 10 oz of thin water per protocol after brushing teeth with min cues for small sips and 1 cough after 2nd sip. Pt remaining seated in wheelchair at end of session with wife present in room.   Therapy Documentation Precautions:  Precautions Precautions: Shoulder, Fall Precaution Comments: Per Dr Tamera Punt (pt s/p Lt RCR early June) NWB Lt UE except to assist with standing and able to use RW; No pulling or pushing with Lt UE;  unrestricted PROM/AROM is okay.  No strengthening   Restrictions Weight Bearing Restrictions: No LUE Weight Bearing: Non weight bearing General:   Vital  Signs: Therapy Vitals Temp: 97.9 F (36.6 C) Temp Source: Oral Pulse Rate: 77 Resp: 17 BP: (!) 133/53 (RN notified) Patient Position (if appropriate): Lying Oxygen Therapy SpO2: 99 % O2 Device: Not Delivered Pain: Pain Assessment Faces Pain Scale: Hurts a little bit Pain Type: Chronic pain Pain Location: Back Pain Descriptors / Indicators: Aching Pain Intervention(s): Medication (See eMAR) ADL: ADL ADL Comments: refer to functional navigator Vision   Perception    Praxis   Exercises:   Other Treatments:    See Function Navigator for Current Functional Status.   Therapy/Group: Individual Therapy  Gypsy Decant 08/14/2017, 5:22 PM

## 2017-08-15 ENCOUNTER — Ambulatory Visit (HOSPITAL_COMMUNITY): Payer: Medicare HMO

## 2017-08-15 ENCOUNTER — Inpatient Hospital Stay (HOSPITAL_COMMUNITY): Payer: Medicare HMO | Admitting: Physical Therapy

## 2017-08-15 ENCOUNTER — Inpatient Hospital Stay (HOSPITAL_COMMUNITY): Payer: Medicare HMO

## 2017-08-15 LAB — GLUCOSE, CAPILLARY
GLUCOSE-CAPILLARY: 115 mg/dL — AB (ref 65–99)
Glucose-Capillary: 159 mg/dL — ABNORMAL HIGH (ref 65–99)
Glucose-Capillary: 105 mg/dL — ABNORMAL HIGH (ref 65–99)
Glucose-Capillary: 126 mg/dL — ABNORMAL HIGH (ref 65–99)

## 2017-08-15 MED ORDER — FUROSEMIDE 20 MG PO TABS
20.0000 mg | ORAL_TABLET | Freq: Once | ORAL | Status: AC
Start: 2017-08-15 — End: 2017-08-15
  Administered 2017-08-15: 20 mg via ORAL
  Filled 2017-08-15: qty 1

## 2017-08-15 MED ORDER — FUROSEMIDE 40 MG PO TABS
40.0000 mg | ORAL_TABLET | Freq: Every day | ORAL | Status: DC
  Administered 2017-08-16 – 2017-08-22 (×7): 40 mg via ORAL
  Filled 2017-08-15 (×7): qty 1

## 2017-08-15 MED ORDER — POTASSIUM CHLORIDE CRYS ER 20 MEQ PO TBCR
20.0000 meq | EXTENDED_RELEASE_TABLET | Freq: Two times a day (BID) | ORAL | Status: DC
  Administered 2017-08-15 – 2017-08-21 (×13): 20 meq via ORAL
  Filled 2017-08-15 (×12): qty 1

## 2017-08-15 NOTE — Progress Notes (Signed)
Speech Language Pathology Weekly Progress and Session Note  Patient Details  Name: Brandon [name] MRN: [id number] Date of Birth: March 27, 1946  Beginning of progress report period: August 08, 2017 End of progress report period: August 15, 2017  Today's Date: 08/15/2017 SLP Individual Time: 1100-1200 SLP Individual Time Calculation (min): 60 min  Short Term Goals: Week 1: SLP Short Term Goal 1 (Week 1): Pt will demonstrate sustained attention to functional task for 5 minutes with Mod multimodal cues. SLP Short Term Goal 1 - Progress (Week 1): Met SLP Short Term Goal 2 (Week 1): Pt will demonstrate intellectual awareness and identify 2 physical and 2 cognitive deficits given Mod verbal cues.  SLP Short Term Goal 2 - Progress (Week 1): Not met SLP Short Term Goal 3 (Week 1): Pt will complete basic, familar tasks with Mod verbal and question cues for functional problem solving. SLP Short Term Goal 3 - Progress (Week 1): Not met SLP Short Term Goal 4 (Week 1): Pt will consume ice chip trials without overt s/s aspiration over 3 consective sessions in order to determine readiness for instrumental study.  SLP Short Term Goal 4 - Progress (Week 1): Met SLP Short Term Goal 5 (Week 1): Pt will increase vocal intensity at phrase level to achieve 80% intelligibilty with Mod verbal cues.  SLP Short Term Goal 5 - Progress (Week 1): Met SLP Short Term Goal 6 (Week 1): Pt will self-monitor and  self-correct verbal and fucntional errors with Max A multimodal cues.  SLP Short Term Goal 6 - Progress (Week 1): Not met    New Short Term Goals: Week 2: SLP Short Term Goal 1 (Week 2): Pt will increase vocal intensity at sentence level to achieve 90% intelligibilty with Min verbal cues.  SLP Short Term Goal 2 (Week 2): Pt will demonstrate intellectual awareness and identify 2 physical and 2 cognitive deficits given Max verbal cues.  SLP Short Term Goal 3 (Week 2): Pt will complete basic, familar tasks with Mod  verbal and question cues for functional problem solving. SLP Short Term Goal 4 (Week 2): Pt will self-monitor and  self-correct verbal and fucntional errors with Max A multimodal cues.  SLP Short Term Goal 5 (Week 2): Pt will consume current diet without s/s aspiration with Supervision cues to use swallowing compensatory strategies. SLP Short Term Goal 6 (Week 2): Pt will return demonstration of EMT device with Min A verbal cues for 25 repetitions.   Weekly Progress Updates: Pt demonstrated ability to met 3 out 6 short term goals. Pt participated in Brandon results indicating diet recommendation of dysphagia 3 and thin liquids with full supervision. Pt demonstrated increase in sustain attention, increase in swallow function, and intelligibility at phrase level. Pt will continue to work on increasing vocal intensity with RMT device and intelligibility strategies, intellectual awareness with increase in intensity of cuing, self-correction/error awareness, basic problem solving and swallow strategies. Recommend to continue ST services at this time in order to increase functional independence and reduce burden of care.     Intensity: Minumum of 1-2 x/day, 30 to 90 minutes Frequency: 3 to 5 out of 7 days Duration/Length of Stay: 08/22/2017 Treatment/Interventions: Cueing hierarchy;Cognitive remediation/compensation;Dysphagia/aspiration precaution training;Functional tasks;Medication managment   Daily Session  Skilled Therapeutic Interventions: Skilled ST services focused on cognitive and swallow goals. SLP facilitated PO consumption of new diet dysphagia 3 and thin liquids via cup during meal time providing min verbal cues to cease verbalization during mastication to reduce risk of aspiration. Pt  demonstrated 1 overt s/s aspiration of dysphagia 3 textured foods in a mil-moderate distracting environment. Pt demonstrated sustained attention during functional conservation about problem solving skills and  intellectual awareness with min A verbal cues. Pt demonstrated ability to identify 1 physical impairment however unable to acknowledge remaining impairments given max A verbal cues. Pt was left in chair with call bell within reach. Recommend to continue ST services.      Function:   Eating Eating   Modified Consistency Diet: Yes Eating Assist Level: Supervision or verbal cues           Cognition Comprehension Comprehension assist level: Understands basic 75 - 89% of the time/ requires cueing 10 - 24% of the time  Expression   Expression assist level: Expresses basic 75 - 89% of the time/requires cueing 10 - 24% of the time. Needs helper to occlude trach/needs to repeat words.  Social Interaction Social Interaction assist level: Interacts appropriately 75 - 89% of the time - Needs redirection for appropriate language or to initiate interaction.  Problem Solving Problem solving assist level: Solves basic 75 - 89% of the time/requires cueing 10 - 24% of the time  Memory Memory assist level: Recognizes or recalls 50 - 74% of the time/requires cueing 25 - 49% of the time   General    Pain Pain Assessment Pain Assessment: No/denies pain  Therapy/Group: Individual Therapy  Wyat Infinger  [facility] 08/15/2017, 5:05 PM

## 2017-08-15 NOTE — Progress Notes (Signed)
[place] PHYSICAL MEDICINE & REHABILITATION     PROGRESS NOTE  Subjective/Complaints: Had a reasonable night. Scratched left arm open in a couple spots. Denies "itching" at present  ROS: pt denies nausea, vomiting, diarrhea, cough, shortness of breath or chest pain   Vital Signs: Blood pressure (!) 145/65, pulse 90, temperature 98.4 F (36.9 C), temperature source Oral, resp. rate 10, height 5\' 9"  (1.753 m), weight 100.2 kg (221 lb), SpO2 99 %. No results found. No results for input(s): WBC, HGB, HCT, PLT in the last 72 hours. No results for input(s): NA, K, CL, GLUCOSE, BUN, CREATININE, CALCIUM in the last 72 hours.  Invalid input(s): CO CBG (last 3)   Recent Labs  08/14/17 1640 08/14/17 2131 08/15/17 0640  GLUCAP 123* 106* 115*    Wt Readings from Last 3 Encounters:  08/15/17 100.2 kg (221 lb)  08/07/17 101.9 kg (224 lb 9.6 oz)  05/18/17 114.3 kg (252 lb)    Physical Exam:   Constitutional: He appears well-developed. No distress Obese  HENT: Normocephalic, atraumatic  Eyes: EOMI. No discharge. Cardiovascular: RRR without murmur. No JVD      Respiratory: CTA Bilaterally without wheezes or rales. Normal effort t  GI: BS+, ND.  Musculoskeletal: He exhibits trace peripheral edema. He exhibits no tenderness.  Neurological: He is alert, disoriented.  HOH.  Motor: RUE: 3+ to 4-/5 right deltoid, bicep, tricep, hand grip Left upper extremity: 4-/5 strength in the deltoid, bicep, tricep, hand grip (stable) Left lower extremity:  4-/5 at the hip flexors, knee extensors, ankle dorsiflexors.  Skin: Skin is warm and dry.  Psychiatric: pleasant and cooperative  Assessment/Plan: 1. Functional deficits secondary to debility/prior CVA which require 3+ hours per day of interdisciplinary therapy in a comprehensive inpatient rehab setting. Physiatrist is providing close team supervision and 24 hour management of active medical problems listed below. Physiatrist and rehab team  continue to assess barriers to discharge/monitor patient progress toward functional and medical goals.  Function:  Bathing Bathing position Bathing activity did not occur: Refused Position: Production manager parts bathed by patient: Right arm, Left arm, Chest, Abdomen, Front perineal area, Buttocks, Right upper leg, Left upper leg Body parts bathed by helper: Right lower leg, Left lower leg, Back  Bathing assist Assist Level: Touching or steadying assistance(Pt > 75%)      Upper Body Dressing/Undressing Upper body dressing   What is the patient wearing?: Pull over shirt/dress     Pull over shirt/dress - Perfomed by patient: Thread/unthread right sleeve, Thread/unthread left sleeve, Put head through opening, Pull shirt over trunk Pull over shirt/dress - Perfomed by helper: Pull shirt over trunk        Upper body assist Assist Level: Supervision or verbal cues      Lower Body Dressing/Undressing Lower body dressing   What is the patient wearing?: Pants, [employer], Shoes, Scientist, water quality - Performed by patient: Thread/unthread right underwear leg, Thread/unthread left underwear leg, Pull underwear up/down   Pants- Performed by patient: Thread/unthread right pants leg, Thread/unthread left pants leg, Pull pants up/down Pants- Performed by helper: Thread/unthread right pants leg, Thread/unthread left pants leg, Pull pants up/down Non-skid slipper socks- Performed by patient: Don/doff right sock, Don/doff left sock Non-skid slipper socks- Performed by helper: Don/doff right sock, Don/doff left sock     Shoes - Performed by patient: Don/doff right shoe, Don/doff left shoe, Fasten right, Fasten left         TED Hose - Performed by helper: Don/doff  right TED hose, Don/doff left TED hose  Lower body assist Assist for lower body dressing: Touching or steadying assistance (Pt > 75%)      Toileting Toileting   Toileting steps completed by patient: Adjust clothing prior  to toileting, Performs perineal hygiene, Adjust clothing after toileting Toileting steps completed by helper: Adjust clothing prior to toileting, Performs perineal hygiene, Adjust clothing after toileting Toileting Assistive Devices: Grab bar or rail  Toileting assist Assist level: Touching or steadying assistance (Pt.75%)   Transfers Chair/bed transfer   Chair/bed transfer method: Stand pivot Chair/bed transfer assist level: Touching or steadying assistance (Pt > 75%) Chair/bed transfer assistive device: Armrests     Locomotion Ambulation     Max distance: 80 Assist level: Touching or steadying assistance (Pt > 75%)   Wheelchair          Cognition Comprehension Comprehension assist level: Follows basic conversation/direction with no assist  Expression Expression assist level: Expresses basic 90% of the time/requires cueing < 10% of the time.  Social Interaction Social Interaction assist level: Interacts appropriately 75 - 89% of the time - Needs redirection for appropriate language or to initiate interaction.  Problem Solving Problem solving assist level: Solves basic 90% of the time/requires cueing < 10% of the time  Memory Memory assist level: Recognizes or recalls 90% of the time/requires cueing < 10% of the time    Medical Problem List and Plan: 1.  Debilitation and right upper extremity weakness due to history of CVA/acute hypoxemic respiratory failure. Patient was extubated 07/25/2017  -continue CIR  -team conference today 2.  DVT Prophylaxis/Anticoagulation: Eliquis. Monitor for any increased bleeding episodes 3. Pain Management/chronic back pain: Tylenol as needed. Patient on oxycodone 5-325mg  1 to 2 tabs every 4 hours as needed prior to admission. Resume as needed  -limit given encephalopathy  -added kpad for back 4. Mood/delirium: Provide emotional support. Patient on Wellbutrin 150 mg twice a day as well as Celexa 20 mg daily prior to admission.   -resumed celexa  10mg  QHS  -sleep chart can be stopped, sleep improved  -continue scheduled trazodone 5. Neuropsych: This patient is not capable of making decisions on his own behalf. 6. Skin/Wound Care: Routine skin checks 7. Fluids/Electrolytes/Nutrition: encourage fluids  -continue to encourage fluids  -change potass supp to capsule 8. Acute on chronic diastolic congestive heart failure.   -increase lasix at\\to 40mg  as weight   trending up  -TEDS/abdominal binder Filed Weights   08/13/17 0500 08/14/17 0604 08/15/17 0315  Weight: 98 kg (216 lb) 97.9 kg (215 lb 12.5 oz) 100.2 kg (221 lb)   - Follow-up per cardiology services as needed 9. Atrial fibrillation. Continue Eliquis. Amiodarone 200 mg twice a day 10. Dysphagia. Dysphagia #3 thin liquids after MBS today  -protein supp   -intake better. Dc megace 11. History of CVA. Follow-up per neurology services and advised to continue eliquis. 12. Recent left rotator cuff surgery. Per Dr. Tamera Punt nonweightbearing left upper extremity except to assist with standing and able to use rolling walker. No pulling or pushing with left upper extremity. Unrestricted passive range of motion active range of motion. No strengthening restrictions 13. Acute on chronic anemia. Latest transfusion 08/07/2017.  hgb 9.3 on 9/1 14. COPD/tobacco abuse. Counseling. Check oxygen saturations every shift 15. CKD  Cr 1.45 on 9/1  Cont to monitor---follow up Wednesday  LOS (Days) [place] T, MD 08/15/2017 8:52 AM

## 2017-08-15 NOTE — Progress Notes (Signed)
Occupational Therapy Note  Patient Details  Name: Brandon [name] MRN: [id number] Date of Birth: [birth year]  Today's Date: 08/15/2017 OT Individual Time: 1300-1330 OT Individual Time Calculation (min): 30 min   Pt denies pain Individual Therapy  Pt resting in w/c upon arrival with wife present.  Pt amb with RW to Day Room and engaged in game of catch on Biodex with/without BUE support.  Pt required steady A for standing balance without UE support.  Pt transitioned to small gym and engaged in Dynavision activities with/without BUE support.  Pt required steady A for standing balance without UE support but improved reaction time by .5 seconds.  Pt returned to room and remained in w/c with wife present.    [name] [facility] 08/15/2017, 3:01 PM

## 2017-08-15 NOTE — Progress Notes (Signed)
Physical Therapy Session Note  Patient Details  Name: Brandon [name] MRN: [id number] Date of Birth: Jan 08, 1946  Today's Date: 08/15/2017 PT Individual Time: 1000-1100 PT Individual Time Calculation (min): 60 min    Skilled Therapeutic Interventions/Progress Updates:   Pt received sitting in wheelchair, hand off from NT. Agreeable to therapy. Wife present for therapy session. Pt close supervision for sit<>stand transfers. Cues for hand placement. Pt ambulated 150 ft to gym with min assist. Demonstrated improved BOS support during ambulation. Pt performed bilat lateral step ups 2 set, 10 reps & 2 sets 5 reps. Pt limited by fatigue. Pt performed Nustep with bilat LE for 5 min x 2 with rest break. Pt performed catching/tossing ball with min guard and no UE support x 2 for ~2-3 min. Pt returned to room total assist, quick release belt on, all needs in reach, wife present.   Therapy Documentation Precautions:  Precautions Precautions: Shoulder, Fall Precaution Comments: Per Dr Tamera Punt (pt s/p Lt RCR early June) NWB Lt UE except to assist with standing and able to use RW; No pulling or pushing with Lt UE;  unrestricted PROM/AROM is okay.  No strengthening   Restrictions Weight Bearing Restrictions: No LUE Weight Bearing: Non weight bearing Pain: Pain in back after ball toss   See Function Navigator for Current Functional Status.   Therapy/Group: Individual Therapy  Gwinda Passe, SPT 08/15/2017, 10:40 AM

## 2017-08-15 NOTE — Progress Notes (Signed)
Occupational Therapy Session Note  Patient Details  Name: Brandon [name] MRN: [id number] Date of Birth: [birth year]  Today's Date: 08/15/2017 OT Individual Time: 0800-0900 OT Individual Time Calculation (min): 60 min    Short Term Goals: Week 1:  OT Short Term Goal 1 (Week 1): Pt will be able to transfer on and off toilet with min A. OT Short Term Goal 2 (Week 1): Pt will bathe self with min A. OT Short Term Goal 3 (Week 1): Pt will don shirt with supervision. OT Short Term Goal 4 (Week 1): Pt will don pants with min A. OT Short Term Goal 5 (Week 1): Pt will demonstrate improved attention to ADL task for at least 5 minutes at a time.   Skilled Therapeutic Interventions/Progress Updates:    Pt resting in w/c upon arrival with wife present.  Pt engaged in BADL retraining including bathing at shower level and dressing with sit<>stand from bench.  Pt amb with RW (steady A) to bathroom and transferred to tub bench.  Pt required max verbal cues for sequencing and thoroughness in shower but completed all dressing tasks without verbal cues.  Pt returned to room and stood at sink to comb hair and complete shaving task.  Pt returned to w/c and engaged in pipe tree activities.  Pt completed medium difficulty structure without verbal cues but required min verbal cues for problem solving with more challenging structure.  Focus on activity tolerance, functional amb with RW, BADL retraining, standing balance, cognitive remediation, and safety awareness to increase independence with BADLs.  Pt remained seated in w/c with QRB in place, all needs within reach, and wife present.   Therapy Documentation Precautions:  Precautions Precautions: Shoulder, Fall Precaution Comments: Per Dr Tamera Punt (pt s/p Lt RCR early June) NWB Lt UE except to assist with standing and able to use RW; No pulling or pushing with Lt UE;  unrestricted PROM/AROM is okay.  No strengthening   Restrictions Weight Bearing Restrictions:  No LUE Weight Bearing: Non weight bearing Pain:  Pt denies pain  See Function Navigator for Current Functional Status.   Therapy/Group: Individual Therapy  Leroy Libman 08/15/2017, 9:05 AM

## 2017-08-16 ENCOUNTER — Inpatient Hospital Stay (HOSPITAL_COMMUNITY): Payer: Medicare HMO

## 2017-08-16 ENCOUNTER — Inpatient Hospital Stay (HOSPITAL_COMMUNITY): Payer: Medicare HMO | Admitting: Physical Therapy

## 2017-08-16 ENCOUNTER — Inpatient Hospital Stay (HOSPITAL_COMMUNITY): Payer: Medicare HMO | Admitting: Speech Pathology

## 2017-08-16 LAB — GLUCOSE, CAPILLARY
GLUCOSE-CAPILLARY: 103 mg/dL — AB (ref 65–99)
GLUCOSE-CAPILLARY: 91 mg/dL (ref 65–99)
GLUCOSE-CAPILLARY: 131 mg/dL — AB (ref 65–99)
GLUCOSE-CAPILLARY: 101 mg/dL — AB (ref 65–99)

## 2017-08-16 LAB — BASIC METABOLIC PANEL
Anion gap: 6 (ref 5–15)
BUN: 15 mg/dL (ref 6–20)
CHLORIDE: 108 mmol/L (ref 101–111)
CO2: 21 mmol/L — ABNORMAL LOW (ref 22–32)
CREATININE: 1.32 mg/dL — AB (ref 0.61–1.24)
Calcium: 8.7 mg/dL — ABNORMAL LOW (ref 8.9–10.3)
GFR calc Af Amer: >60 mL/min (ref >60)
GFR calc non Af Amer: 53 mL/min — ABNORMAL LOW (ref >60)
GLUCOSE: 100 mg/dL — AB (ref 65–99)
Potassium: 4.7 mmol/L (ref 3.5–5.1)
SODIUM: 135 mmol/L (ref 135–145)

## 2017-08-16 LAB — CBC
HCT: 30.8 % — ABNORMAL LOW (ref 39.0–52.0)
Hemoglobin: 9.7 g/dL — ABNORMAL LOW (ref 13.0–17.0)
MCH: 30.9 pg (ref 26.0–34.0)
MCHC: 31.5 g/dL (ref 30.0–36.0)
MCV: 98.1 fL (ref 78.0–100.0)
PLATELETS: 405 10*3/uL — AB (ref 150–400)
RBC: 3.14 MIL/uL — ABNORMAL LOW (ref 4.22–5.81)
RDW: 17.8 % — AB (ref 11.5–15.5)
WBC: 9.7 10*3/uL (ref 4.0–10.5)

## 2017-08-16 MED ORDER — PANTOPRAZOLE SODIUM 20 MG PO TBEC
20.0000 mg | DELAYED_RELEASE_TABLET | Freq: Every day | ORAL | Status: DC
  Administered 2017-08-17 – 2017-08-22 (×6): 20 mg via ORAL
  Filled 2017-08-16 (×6): qty 1

## 2017-08-16 NOTE — Progress Notes (Signed)
Physical Therapy Weekly Progress Note  Patient Details  Name: Brandon [name] MRN: [id number] Date of Birth: December 16, 1945  Beginning of progress report period: August 08, 2017 End of progress report period: August 16, 2017  Today's Date: 08/16/2017 PT Individual Time: 1100-1200 PT Individual Time Calculation (min): 60 min   Patient has met 4 of 4 short term goals.  Pt is overall close supervision with RW. Min assist for gait without AD. Limited by endurance and fatigue. Pt requires mod cues for safety awareness. Wife present for most PT sessions and actively participates.   Patient continues to demonstrate the following deficits muscle weakness, decreased cardiorespiratoy endurance and decreased balance strategies and therefore will continue to benefit from skilled PT intervention to increase functional independence with mobility.  Patient progressing toward long term goals..  Continue plan of care.  PT Short Term Goals Week 1:  PT Short Term Goal 1 (Week 1): Pt will perform sit to stand with min assist and min cues  PT Short Term Goal 1 - Progress (Week 1): Met PT Short Term Goal 2 (Week 1): Pt will navigate up/down 8, 3" stairs with min assist PT Short Term Goal 2 - Progress (Week 1): Met PT Short Term Goal 3 (Week 1): Pt will ambulate 80 ft with RW and min assist PT Short Term Goal 3 - Progress (Week 1): Met PT Short Term Goal 4 (Week 1): Pt will supine<>sit with supervision and min cues  PT Short Term Goal 4 - Progress (Week 1): Met Week 2:  PT Short Term Goal 1 (Week 2): = LTG due to length of stay  Skilled Therapeutic Interventions/Progress Updates:   Pt received sitting up in wheelchair, agreeable to therapy. Wife and niece present for part of therapy session. Pt ambulated 200 ft to apartment. Pt transfer on/off low sofa with min assist for push up. Pt sit<>supine with supervision and cues for safety. Pt able to navigate up/down 8, 3" stairs & then 4, 6" stairs with 2 rails and  supervision. Pt ambulated obstacle course over curb step, around cones, and over poles with RW and min guard. Pt complete obstacle course again with no AD and min guard. Pt ambulated 60 ft x 3 with no AD and min guard. Pt presented with hyperextension of right knee during stance phase, decreased push off and bilat hip drop. Kinesotape applied to posterior right knee for tactile feedback to promote decrease in hyperextension. Pt performed 2 sets, 15 reps of heel raises and 2 set, 10 reps of standing hip abduction. Pt returned to room total assist. Left sitting up in wheelchair, all needs in reach, wife present.   Therapy Documentation Precautions:  Precautions Precautions: Shoulder, Fall Precaution Comments: Per Dr Tamera Punt (pt s/p Lt RCR early June) NWB Lt UE except to assist with standing and able to use RW; No pulling or pushing with Lt UE;  unrestricted PROM/AROM is okay.  No strengthening   Restrictions Weight Bearing Restrictions: No LUE Weight Bearing: Non weight bearing   See Function Navigator for Current Functional Status.  Therapy/Group: Individual Therapy  Gwinda Passe, SPT 08/16/2017, 12:52 PM

## 2017-08-16 NOTE — Progress Notes (Signed)
Physical Therapy Session Note  Patient Details  Name: Brandon [name] MRN: [id number] Date of Birth: [birth year]  Today's Date: 08/16/2017 PT Individual Time: 1330-1400 PT Individual Time Calculation (min): 30 min   Short Term Goals: Week 2:  PT Short Term Goal 1 (Week 2): = LTG due to length of stay  Skilled Therapeutic Interventions/Progress Updates:    Pt sitting in w/c upon PT arrival, agreeable to therapy tx and denies pain. Pt ambulated x 100 ft to the dayroom with RW and supervision. Pt used kinetron in sitting x 2 trials and in standing x 2 trials for LE strengthening and activity tolerance. Pt ambulated x 100 ft using RW and with supervision. Pt performed 2 x 5 sit<>stands for LE strengthening. Pt ambulated back to room x 100 ft using RW and supervision, verbal cues for upright posture. Pt left seated in w/c at end of session with needs in reach.   Therapy Documentation Precautions:  Precautions Precautions: Shoulder, Fall Precaution Comments: Per Dr Tamera Punt (pt s/p Lt RCR early June) NWB Lt UE except to assist with standing and able to use RW; No pulling or pushing with Lt UE;  unrestricted PROM/AROM is okay.  No strengthening   Restrictions Weight Bearing Restrictions: No LUE Weight Bearing: Non weight bearing   See Function Navigator for Current Functional Status.   Therapy/Group: Individual Therapy  Netta Corrigan, PT, DPT 08/16/2017, 2:02 PM

## 2017-08-16 NOTE — Progress Notes (Signed)
Speech Language Pathology Daily Session Note  Patient Details  Name: Brandon [name] MRN: [id number] Date of Birth: [birth year]  Today's Date: 08/16/2017 SLP Individual Time: 1500-1530 SLP Individual Time Calculation (min): 30 min  Short Term Goals: Week 2: SLP Short Term Goal 1 (Week 2): Pt will increase vocal intensity at sentence level to achieve 90% intelligibilty with Min verbal cues.  SLP Short Term Goal 2 (Week 2): Pt will demonstrate intellectual awareness and identify 2 physical and 2 cognitive deficits given Max verbal cues.  SLP Short Term Goal 3 (Week 2): Pt will complete basic, familar tasks with Mod verbal and question cues for functional problem solving. SLP Short Term Goal 4 (Week 2): Pt will self-monitor and  self-correct verbal and fucntional errors with Max A multimodal cues.  SLP Short Term Goal 5 (Week 2): Pt will consume current diet without s/s aspiration with Supervision cues to use swallowing compensatory strategies. SLP Short Term Goal 6 (Week 2): Pt will return demonstration of EMT device with Min A verbal cues for 25 repetitions.   Skilled Therapeutic Interventions:  Pt was seen for skilled ST targeting cognitive goals.  Pt needed mod-max assist verbal cues for sequencing to return demonstrate use of EMST trainer for ~10 repetitions.  Pt was becoming fatigued with attempts to correct so further trials were abandoned.  Therapist facilitated the session with a novel card game to address problem solving goals.  Pt initially required mod assist to plan and execute a problem solving strategy; however, as task progressed therapist was able to fade cues to min assist.  Pt was left in wheelchair with wife at bedside and call bell within reach.  Continue per current plan of care.    Function:  Eating Eating                 Cognition Comprehension Comprehension assist level: Understands basic 90% of the time/cues < 10% of the time  Expression   Expression assist  level: Expresses basic 90% of the time/requires cueing < 10% of the time.  Social Interaction Social Interaction assist level: Interacts appropriately 90% of the time - Needs monitoring or encouragement for participation or interaction.  Problem Solving Problem solving assist level: Solves basic 75 - 89% of the time/requires cueing 10 - 24% of the time  Memory Memory assist level: Recognizes or recalls 50 - 74% of the time/requires cueing 25 - 49% of the time    Pain Pain Assessment Pain Assessment: No/denies pain  Therapy/Group: Individual Therapy  Raymund Manrique, Selinda Orion 08/16/2017, 3:52 PM

## 2017-08-16 NOTE — Progress Notes (Signed)
Occupational Therapy Weekly Progress Note  Patient Details  Name: Brandon [name] MRN: [id number] Date of Birth: [birth year]  Beginning of progress report period: August 08, 2017 End of progress report period: August 16, 2017  Patient has met 5 of 5 short term goals. Pt made steady progress with BADLs during the past week.  Pt requires min A/close supervision for bathing/dressing and toileting tasks. Pt continues to require steady A/close supervision for functional amb with RW and for dynamic standing balance.  Pt required mod verbal cues for safety awareness.  Pt's wife has been present for multiple therapy sessions.    Patient continues to demonstrate the following deficits: muscle weakness, decreased cardiorespiratoy endurance, decreased initiation, decreased attention, decreased awareness, decreased problem solving, decreased safety awareness and delayed processing and decreased sitting balance, decreased standing balance and decreased balance strategies and therefore will continue to benefit from skilled OT intervention to enhance overall performance with BADL.  Patient progressing toward long term goals..  Continue plan of care.  OT Short Term Goals Week 1:  OT Short Term Goal 1 (Week 1): Pt will be able to transfer on and off toilet with min A. OT Short Term Goal 1 - Progress (Week 1): Met OT Short Term Goal 2 (Week 1): Pt will bathe self with min A. OT Short Term Goal 2 - Progress (Week 1): Met OT Short Term Goal 3 (Week 1): Pt will don shirt with supervision. OT Short Term Goal 3 - Progress (Week 1): Met OT Short Term Goal 4 (Week 1): Pt will don pants with min A. OT Short Term Goal 4 - Progress (Week 1): Met OT Short Term Goal 5 (Week 1): Pt will demonstrate improved attention to ADL task for at least 5 minutes at a time.  OT Short Term Goal 5 - Progress (Week 1): Met Week 2:  OT Short Term Goal 1 (Week 2): STG=LTG secondary to ELOS      Therapy Documentation Precautions:   Precautions Precautions: Shoulder, Fall Precaution Comments: Per Dr Tamera Punt (pt s/p Lt RCR early June) NWB Lt UE except to assist with standing and able to use RW; No pulling or pushing with Lt UE;  unrestricted PROM/AROM is okay.  No strengthening   Restrictions Weight Bearing Restrictions: No LUE Weight Bearing: Non weight bearing  See Function Navigator for Current Functional Status.   [name] [facility] 08/16/2017, 6:46 AM

## 2017-08-16 NOTE — Progress Notes (Signed)
Occupational Therapy Session Note  Patient Details  Name: Brandon [name] MRN: [id number] Date of Birth: 11/12/1946  Today's Date: 08/16/2017 OT Individual Time: 0830-0930 OT Individual Time Calculation (min): 60 min    Short Term Goals: Week 2:  OT Short Term Goal 1 (Week 2): STG=LTG secondary to ELOS  Skilled Therapeutic Interventions/Progress Updates:    Pt resting in w/c upon arrival with wife present.  Pt initially engaged in BADL retraining including bathing at shower level and dressing with sit<>stand from seat.  Pt amb with RW to bathroom and transferred to tub bench requiring steady A for transfer.  Pt completed bathing and dressing tasks at supervision level.  Pt amb with RW to gym and engaged in RW task locating and retrieving numbered discs in sequence.  Pt completed task with min verbal cues to scan environment.  Pt fatigues easily and requires multiple rest breaks.  Pt returned to room and remained in w/c with wife present and all needs within reach.   Therapy Documentation Precautions:  Precautions Precautions: Shoulder, Fall Precaution Comments: Per Dr Tamera Punt (pt s/p Lt RCR early June) NWB Lt UE except to assist with standing and able to use RW; No pulling or pushing with Lt UE;  unrestricted PROM/AROM is okay.  No strengthening   Restrictions Weight Bearing Restrictions: No LUE Weight Bearing: Non weight bearing   Pain:  Pt denies pain  See Function Navigator for Current Functional Status.   Therapy/Group: Individual Therapy  Leroy Libman 08/16/2017, 2:55 PM

## 2017-08-16 NOTE — Progress Notes (Signed)
Occupational Therapy Note  Patient Details  Name: Brandon [name] MRN: [id number] Date of Birth: 1946-06-30  Today's Date: 08/16/2017 OT Individual Time: 1400-1430 OT Individual Time Calculation (min): 30 min   Pt denies pain Individual Therapy  Pt resting in w/c upon arrival and ready for therapy.  Pt amb with RW to gym and engaged in colored peg board activity.  Pt challenged with replicating moderately complex pattern.  Pt required min verbal cues for correcting mistakes but demonstrated improved problem solving strategies and self correcting techniques.  Pt returned to room and remained in w/c with wife present and all needs within reach.    [name] [facility] 08/16/2017, 2:49 PM

## 2017-08-16 NOTE — Progress Notes (Signed)
[place] PHYSICAL MEDICINE & REHABILITATION     PROGRESS NOTE  Subjective/Complaints: Feeling well. Asked which day he's going home. Low back sore but k pad helps  ROS: pt denies nausea, vomiting, diarrhea, cough, shortness of breath or chest pain   Vital Signs: Blood pressure 130/64, pulse 94, temperature 98 F (36.7 C), temperature source Oral, resp. rate 18, height 5\' 9"  (1.753 m), weight 102.1 kg (225 lb), SpO2 98 %. No results found.  Recent Labs  08/16/17 0540  WBC 9.7  HGB 9.7*  HCT 30.8*  PLT 405*    Recent Labs  08/16/17 0540  NA 135  K 4.7  CL 108  GLUCOSE 100*  BUN 15  CREATININE 1.32*  CALCIUM 8.7*   CBG (last 3)   Recent Labs  08/15/17 1620 08/15/17 2051 08/16/17 0648  GLUCAP 105* 126* 103*    Wt Readings from Last 3 Encounters:  08/16/17 102.1 kg (225 lb)  08/07/17 101.9 kg (224 lb 9.6 oz)  05/18/17 114.3 kg (252 lb)    Physical Exam:   Constitutional: He appears well-developed. No distress Obese  HENT: Normocephalic, atraumatic  Eyes: EOMI. No discharge. Cardiovascular: RRR without murmur. No JVD       Respiratory: CTA Bilaterally without wheezes or rales. Normal effort   GI: BS+, ND.  Musculoskeletal: He exhibits trace peripheral edema. He exhibits no tenderness.  Neurological: He is alert, oriented to place, person/time HOH.  Motor: RUE:  4-/5 right deltoid, bicep, tricep, hand grip Left upper extremity: 4-/5 strength in the deltoid, bicep, tricep, hand grip (stable) Left lower extremity:  4-/5 at the hip flexors, knee extensors, ankle dorsiflexors.  Skin: Skin is warm and dry.  Psychiatric: pleasant and cooperative. More interactive  Assessment/Plan: 1. Functional deficits secondary to debility/prior CVA which require 3+ hours per day of interdisciplinary therapy in a comprehensive inpatient rehab setting. Physiatrist is providing close team supervision and 24 hour management of active medical problems listed below. Physiatrist  and rehab team continue to assess barriers to discharge/monitor patient progress toward functional and medical goals.  Function:  Bathing Bathing position Bathing activity did not occur: Refused Position: Production manager parts bathed by patient: Right arm, Left arm, Chest, Abdomen, Front perineal area, Buttocks, Right upper leg, Left upper leg Body parts bathed by helper: Right lower leg, Left lower leg, Back  Bathing assist Assist Level: Touching or steadying assistance(Pt > 75%)      Upper Body Dressing/Undressing Upper body dressing   What is the patient wearing?: Pull over shirt/dress     Pull over shirt/dress - Perfomed by patient: Thread/unthread right sleeve, Thread/unthread left sleeve, Put head through opening, Pull shirt over trunk Pull over shirt/dress - Perfomed by helper: Pull shirt over trunk        Upper body assist Assist Level: Supervision or verbal cues      Lower Body Dressing/Undressing Lower body dressing   What is the patient wearing?: Underwear, Pants, Non-skid slipper socks Underwear - Performed by patient: Thread/unthread right underwear leg, Pull underwear up/down Underwear - Performed by helper: Thread/unthread left underwear leg Pants- Performed by patient: Thread/unthread right pants leg, Thread/unthread left pants leg, Pull pants up/down Pants- Performed by helper: Thread/unthread right pants leg, Thread/unthread left pants leg, Pull pants up/down Non-skid slipper socks- Performed by patient: Don/doff right sock, Don/doff left sock Non-skid slipper socks- Performed by helper: Don/doff right sock, Don/doff left sock     Shoes - Performed by patient: Don/doff right shoe, Don/doff  left shoe, Fasten right, Fasten left         TED Hose - Performed by helper: Don/doff right TED hose, Don/doff left TED hose  Lower body assist Assist for lower body dressing: Touching or steadying assistance (Pt > 75%)      Toileting Toileting   Toileting  steps completed by patient: Adjust clothing prior to toileting, Performs perineal hygiene, Adjust clothing after toileting Toileting steps completed by helper: Adjust clothing prior to toileting, Performs perineal hygiene, Adjust clothing after toileting Toileting Assistive Devices: Grab bar or rail  Toileting assist Assist level: Touching or steadying assistance (Pt.75%)   Transfers Chair/bed transfer   Chair/bed transfer method: Stand pivot Chair/bed transfer assist level: Touching or steadying assistance (Pt > 75%) Chair/bed transfer assistive device: Armrests     Locomotion Ambulation     Max distance: 150 Assist level: Touching or steadying assistance (Pt > 75%)   Wheelchair          Cognition Comprehension Comprehension assist level: Understands basic 75 - 89% of the time/ requires cueing 10 - 24% of the time  Expression Expression assist level: Expresses basic 75 - 89% of the time/requires cueing 10 - 24% of the time. Needs helper to occlude trach/needs to repeat words.  Social Interaction Social Interaction assist level: Interacts appropriately 75 - 89% of the time - Needs redirection for appropriate language or to initiate interaction.  Problem Solving Problem solving assist level: Solves basic 75 - 89% of the time/requires cueing 10 - 24% of the time  Memory Memory assist level: Recognizes or recalls 50 - 74% of the time/requires cueing 25 - 49% of the time    Medical Problem List and Plan: 1.  Debilitation and right upper extremity weakness due to history of CVA/acute hypoxemic respiratory failure. Patient was extubated 07/25/2017  -continue CIR  2.  DVT Prophylaxis/Anticoagulation: Eliquis. Monitor for any increased bleeding episodes 3. Pain Management/chronic back pain: Tylenol as needed. Patient on oxycodone 5-325mg  1 to 2 tabs every 4 hours as needed prior to admission. Resume as needed  -limit neuro-sedating meds  -added kpad for back which has been helpful 4.  Mood/delirium: Provide emotional support. Patient on Wellbutrin 150 mg twice a day as well as Celexa 20 mg daily prior to admission.   -resumed celexa 10mg  QHS  -continue scheduled trazodone 5. Neuropsych: This patient is not capable of making decisions on his own behalf. 6. Skin/Wound Care: Routine skin checks 7. Fluids/Electrolytes/Nutrition: encourage fluids  -continue to encourage fluids  -change potass supp to capsule 8. Acute on chronic diastolic congestive heart failure.   -increased lasix to 40mg  as weight   trending up (although some of this is healthy weight due to improved intake)  -question some of weight accuracy also  -TEDS/abdominal binder Filed Weights   08/14/17 0604 08/15/17 0315 08/16/17 0329  Weight: 97.9 kg (215 lb 12.5 oz) 100.2 kg (221 lb) 102.1 kg (225 lb)   - Follow-up per cardiology services as needed 9. Atrial fibrillation. Continue Eliquis. Amiodarone 200 mg twice a day 10. Dysphagia. Dysphagia #3 thin liquids after MBS today  -protein supp   -intake better. Dc megace 11. History of CVA. Follow-up per neurology services and advised to continue eliquis. 12. Recent left rotator cuff surgery. Per Dr. Tamera Punt nonweightbearing left upper extremity except to assist with standing and able to use rolling walker. No pulling or pushing with left upper extremity. Unrestricted passive range of motion active range of motion. No strengthening restrictions 13. Acute  on chronic anemia. Latest transfusion 08/07/2017.  hgb up to 9.7 today 14. COPD/tobacco abuse.  15. CKD  Cr 1.32 today  Cont to monitor---follow up Wednesday  LOS (Days) [place] T, MD 08/16/2017 10:16 AM

## 2017-08-17 ENCOUNTER — Inpatient Hospital Stay (HOSPITAL_COMMUNITY): Payer: Medicare HMO | Admitting: Speech Pathology

## 2017-08-17 ENCOUNTER — Inpatient Hospital Stay (HOSPITAL_COMMUNITY): Payer: Medicare HMO | Admitting: Physical Therapy

## 2017-08-17 ENCOUNTER — Inpatient Hospital Stay (HOSPITAL_COMMUNITY): Payer: Medicare HMO

## 2017-08-17 LAB — GLUCOSE, CAPILLARY
Glucose-Capillary: 110 mg/dL — ABNORMAL HIGH (ref 65–99)
Glucose-Capillary: 100 mg/dL — ABNORMAL HIGH (ref 65–99)
Glucose-Capillary: 128 mg/dL — ABNORMAL HIGH (ref 65–99)
Glucose-Capillary: 104 mg/dL — ABNORMAL HIGH (ref 65–99)

## 2017-08-17 MED ORDER — ENSURE ENLIVE PO LIQD
237.0000 mL | Freq: Every day | ORAL | Status: DC
  Administered 2017-08-19 – 2017-08-20 (×2): 237 mL via ORAL

## 2017-08-17 MED ORDER — PRO-STAT SUGAR FREE PO LIQD
30.0000 mL | Freq: Every day | ORAL | Status: DC
  Administered 2017-08-18 – 2017-08-22 (×5): 30 mL via ORAL
  Filled 2017-08-17 (×5): qty 30

## 2017-08-17 NOTE — Plan of Care (Signed)
Problem: RH SKIN INTEGRITY Goal: RH STG SKIN FREE OF INFECTION/BREAKDOWN No breakdown or pressure areas at discharge  Outcome: Progressing No skin issues noted other than bruises to both arms  Problem: RH SAFETY Goal: RH STG ADHERE TO SAFETY PRECAUTIONS W/ASSISTANCE/DEVICE STG Adhere to Safety Precautions With min  Assistance/Device.  Outcome: Progressing Patient is calling for assistance and is aware of safety precautions

## 2017-08-17 NOTE — Progress Notes (Signed)
Physical Therapy Note  Patient Details  Name: Brandon [name] MRN: [id number] Date of Birth: 02-May-1946 Today's Date: 08/17/2017  1100-1200, 60 min individual tx Pain: none per pt  Family ed with wife for gait in room, gait on level tile and carpet,  toilet transfer; wife demonstrated competence.  Gait on level tile and carpet x 150' with close superviison, 1 standing rest break, RW.  Pt DOE but recovered quickly upon sitting.  NuStep for activity tolerance, at level 4 x 8 minutes, bil LEs, RUe.  Pt noted to have R knee instability during R SLS. Neuromuscular re-education via forced use, positioning in standing on wedge for sustained stretch bil heel cords and hamstrings, standing on Airex compliant mat- mini squats, low marching, calf raises.. With external perturbations, pt's ankle strategy was absent bil and hip response delayed bil and inadequate. Gait to return to room.  Pt left resting in w/c with wife present and all needs within reach. Wife stated she would don quick release belt if she goes to BR or leaves room.   See function navigator for current status.   Jamesetta Greenhalgh 08/17/2017, 10:50 AM

## 2017-08-17 NOTE — Progress Notes (Signed)
Speech Language Pathology Daily Session Note  Patient Details  Name: Brandon [name] MRN: [id number] Date of Birth: 03/17/1946  Today's Date: 08/17/2017 SLP Individual Time: 1030-1100 SLP Individual Time Calculation (min): 30 min  Short Term Goals: Week 2: SLP Short Term Goal 1 (Week 2): Pt will increase vocal intensity at sentence level to achieve 90% intelligibilty with Min verbal cues.  SLP Short Term Goal 2 (Week 2): Pt will demonstrate intellectual awareness and identify 2 physical and 2 cognitive deficits given Max verbal cues.  SLP Short Term Goal 3 (Week 2): Pt will complete basic, familar tasks with Mod verbal and question cues for functional problem solving. SLP Short Term Goal 4 (Week 2): Pt will self-monitor and  self-correct verbal and fucntional errors with Max A multimodal cues.  SLP Short Term Goal 5 (Week 2): Pt will consume current diet without s/s aspiration with Supervision cues to use swallowing compensatory strategies. SLP Short Term Goal 6 (Week 2): Pt will return demonstration of EMT device with Min A verbal cues for 25 repetitions.   Skilled Therapeutic Interventions: Skilled treatment session focused on cognitive goals. SLP facilitated session by providing supervision verbal cues for functional problem solving during a basic money management task. Patient also performed 25 repetitions of EMST exercises with Min A verbal cues for accuracy and a self-perceived effort level of 4/10. Patient left upright in wheelchair with wife present. Continue with current plan of care.      Function:  Eating Eating   Modified Consistency Diet: No Eating Assist Level: Supervision or verbal cues           Cognition Comprehension Comprehension assist level: Understands basic 75 - 89% of the time/ requires cueing 10 - 24% of the time  Expression   Expression assist level: Expresses basic 75 - 89% of the time/requires cueing 10 - 24% of the time. Needs helper to occlude  trach/needs to repeat words.  Social Interaction Social Interaction assist level: Interacts appropriately 75 - 89% of the time - Needs redirection for appropriate language or to initiate interaction.  Problem Solving Problem solving assist level: Solves basic 75 - 89% of the time/requires cueing 10 - 24% of the time  Memory Memory assist level: Recognizes or recalls 50 - 74% of the time/requires cueing 25 - 49% of the time    Pain Pain Assessment Pain Assessment: No/denies pain  Therapy/Group: Individual Therapy  Kermit Arnette 08/17/2017, 3:04 PM

## 2017-08-17 NOTE — Progress Notes (Signed)
Nutrition Follow-up  DOCUMENTATION CODES:   Obesity unspecified  INTERVENTION:  Provide Ensure Enlive po once daily, each supplement provides 350 kcal and 20 grams of protein.  Provide 30 ml Prostat po once daily, each supplement provides 100 kcal and 15 grams of protein.   Encourage adequate PO intake.   NUTRITION DIAGNOSIS:   Inadequate oral intake related to dysphagia as evidenced by meal completion < 50%; improving  GOAL:   Patient will meet greater than or equal to 90% of their needs; met  MONITOR:   PO intake, Supplement acceptance, Diet advancement, Labs, Weight trends, Skin, I & O's  REASON FOR ASSESSMENT:   Consult Calorie Count  ASSESSMENT:   71 y.o. right handed male with history of CVA with carotid stenting, hypertension, aortic stenosis, tobacco abuse, [name], Chronic back pain and recent rotator cuff surgery 05/18/2017.  Presented 07/13/2017 for nonspecific chest pain, diaphoresis elevated troponin 0.12. Chest x-ray with interval development of pulmonary edema.  Patient did require intubation for acute hypoxic respiratory failure 07/14/2017. TEE showed moderate LVH, moderate AI with moderate to severe mitral regurgitation.  A Cor-Track tube was placed 07/26/2017 for nutritional support andModified barium swallow completed 08/07/2017 placed on a dysphagia #2 nectar thick liquid diet   Meal completion has been 75-100%. Intake has improved. Pt currently has Prostat and Ensure ordered TID and pt has been consuming most of them. RD to modify orders as pt intake at meals has increased.   Labs and medications reviewed.   Diet Order:  DIET DYS 3 Room service appropriate? Yes; Fluid consistency: Thin  Skin:  Reviewed, no issues  Last BM:  9/5  Height:   Ht Readings from Last 1 Encounters:  08/07/17 '5\' 9"'  (1.753 m)    Weight:   Wt Readings from Last 1 Encounters:  08/17/17 220 lb (99.8 kg)    Ideal Body Weight:  72.7 kg  BMI:  Body mass index  is 32.49 kg/m.  Estimated Nutritional Needs:   Kcal:  2000-2200  Protein:  100-120 grams  Fluid:  Per MD  EDUCATION NEEDS:   No education needs identified at this time  Corrin Parker, MS, RD, LDN Pager # [phone] After hours/ weekend pager # [phone]

## 2017-08-17 NOTE — Progress Notes (Signed)
Social Work Patient ID: Brandon [name], male   DOB: 02-15-1946, 71 y.o.   MRN: [id number]   CSW met with pt and his wife to update them on team conference discussion and targeted d/c date of 08-22-17.  Pt/wife are pleased that pt's on track for d/c and that he has improved so much over the week.  Wife wondering if telesitter can be d/c'd.  CSW spoke with RN and PA and both agreed it could be d/c'd.  CSW will continue to follow and assist as needed.

## 2017-08-17 NOTE — Progress Notes (Signed)
[facility] PHYSICAL MEDICINE & REHABILITATION     PROGRESS NOTE  Subjective/Complaints: No new issues. Feels well. Slept great last night. Appetite. Getting stroner  ROS: pt denies nausea, vomiting, diarrhea, cough, shortness of breath or chest pain   Vital Signs: Blood pressure (!) 155/58, pulse 90, temperature 97.7 F (36.5 C), temperature source Oral, resp. rate 18, height 5\' 9"  (1.753 m), weight 99.8 kg (220 lb), SpO2 98 %. No results found.  Recent Labs  08/16/17 0540  WBC 9.7  HGB 9.7*  HCT 30.8*  PLT 405*    Recent Labs  08/16/17 0540  NA 135  K 4.7  CL 108  GLUCOSE 100*  BUN 15  CREATININE 1.32*  CALCIUM 8.7*   CBG (last 3)   Recent Labs  08/16/17 1701 08/16/17 2139 08/17/17 0623  GLUCAP 131* 101* 110*    Wt Readings from Last 3 Encounters:  08/17/17 99.8 kg (220 lb)  08/07/17 101.9 kg (224 lb 9.6 oz)  05/18/17 114.3 kg (252 lb)    Physical Exam:   Constitutional: He appears well-developed. No distress Obese  HENT: Normocephalic, atraumatic  Eyes: EOMI. No discharge. Cardiovascular: RRR without murmur. No JVD        Respiratory: CTA Bilaterally without wheezes or rales. Normal effort t   GI: BS+, ND.  Musculoskeletal: He exhibits no peripheral edema. He exhibits no tenderness.  Neurological: He is alert, oriented to place, person/time HOH.  Motor: RUE:  4-/5 right deltoid, bicep, tricep, hand grip Left upper extremity: 4-/5 strength in the deltoid, bicep, tricep, hand grip (stable) Left lower extremity:  4-/5 at the hip flexors, knee extensors, ankle dorsiflexors.  Skin: Skin is warm and dry.  Psychiatric: pleasant and cooperative. More interactive  Assessment/Plan: 1. Functional deficits secondary to debility/prior CVA which require 3+ hours per day of interdisciplinary therapy in a comprehensive inpatient rehab setting. Physiatrist is providing close team supervision and 24 hour management of active medical problems listed  below. Physiatrist and rehab team continue to assess barriers to discharge/monitor patient progress toward functional and medical goals.  Function:  Bathing Bathing position Bathing activity did not occur: Refused Position: Production manager parts bathed by patient: Right arm, Left arm, Chest, Abdomen, Front perineal area, Buttocks, Right upper leg, Left upper leg, Right lower leg, Left lower leg Body parts bathed by helper: Right lower leg, Left lower leg, Back  Bathing assist Assist Level: Supervision or verbal cues      Upper Body Dressing/Undressing Upper body dressing   What is the patient wearing?: Pull over shirt/dress     Pull over shirt/dress - Perfomed by patient: Thread/unthread right sleeve, Thread/unthread left sleeve, Put head through opening, Pull shirt over trunk Pull over shirt/dress - Perfomed by helper: Pull shirt over trunk        Upper body assist Assist Level: Supervision or verbal cues      Lower Body Dressing/Undressing Lower body dressing   What is the patient wearing?: Pants, Non-skid slipper socks, [employer], Shoes, Scientist, water quality - Performed by patient: Thread/unthread right underwear leg, Thread/unthread left underwear leg, Pull underwear up/down Underwear - Performed by helper: Thread/unthread left underwear leg Pants- Performed by patient: Thread/unthread right pants leg, Thread/unthread left pants leg, Pull pants up/down Pants- Performed by helper: Thread/unthread right pants leg, Thread/unthread left pants leg, Pull pants up/down Non-skid slipper socks- Performed by patient: Don/doff right sock, Don/doff left sock Non-skid slipper socks- Performed by helper: Don/doff right sock, Don/doff left sock  Shoes - Performed by patient: Don/doff right shoe, Don/doff left shoe, Fasten right, Fasten left         TED Hose - Performed by helper: Don/doff right TED hose, Don/doff left TED hose  Lower body assist Assist for lower body  dressing: Supervision or verbal cues      Toileting Toileting   Toileting steps completed by patient: Adjust clothing prior to toileting, Performs perineal hygiene, Adjust clothing after toileting Toileting steps completed by helper: Adjust clothing prior to toileting, Performs perineal hygiene, Adjust clothing after toileting Toileting Assistive Devices: Grab bar or rail  Toileting assist Assist level: Touching or steadying assistance (Pt.75%)   Transfers Chair/bed transfer   Chair/bed transfer method: Ambulatory Chair/bed transfer assist level: Supervision or verbal cues Chair/bed transfer assistive device: Armrests, Medical sales representative     Max distance: 100 ft Assist level: Supervision or verbal cues   Wheelchair          Cognition Comprehension Comprehension assist level: Understands basic 90% of the time/cues < 10% of the time  Expression Expression assist level: Expresses basic 90% of the time/requires cueing < 10% of the time.  Social Interaction Social Interaction assist level: Interacts appropriately 90% of the time - Needs monitoring or encouragement for participation or interaction.  Problem Solving Problem solving assist level: Solves basic 75 - 89% of the time/requires cueing 10 - 24% of the time  Memory Memory assist level: Recognizes or recalls 50 - 74% of the time/requires cueing 25 - 49% of the time    Medical Problem List and Plan: 1.  Debilitation and right upper extremity weakness due to history of CVA/acute hypoxemic respiratory failure. Patient was extubated 07/25/2017  -continue CIR  2.  DVT Prophylaxis/Anticoagulation: Eliquis. Monitor for any increased bleeding episodes 3. Pain Management/chronic back pain: Tylenol as needed. Patient on oxycodone 5-325mg  1 to 2 tabs every 4 hours as needed prior to admission. Resume as needed  -limiting neuro-sedating meds  -  kpad for low back pain 4. Mood : Provide emotional support. Patient on  Wellbutrin 150 mg twice a day as well as Celexa 20 mg daily prior to admission.   -resumed celexa 10mg  QHS  -continue scheduled trazodone  -confusion has largely resolved 5. Neuropsych: This patient is not capable of making decisions on his own behalf. 6. Skin/Wound Care: Routine skin checks 7. Fluids/Electrolytes/Nutrition: encourage fluids  -continue to encourage fluids  -change potass supp to capsule 8. Acute on chronic diastolic congestive heart failure.   -increased lasix to 40mg  daily. Weight leveled off from yesterday  -appears euvolemic for the most part  -continue to track. Weight accuracy?  -TEDS/abdominal binder Filed Weights   08/15/17 0315 08/16/17 0329 08/17/17 0430  Weight: 100.2 kg (221 lb) 102.1 kg (225 lb) 99.8 kg (220 lb)   - Follow-up per cardiology services as needed 9. Atrial fibrillation. Continue Eliquis. Amiodarone 200 mg twice a day 10. Dysphagia. Dysphagia #3 thin liquids   -protein supp   -intake better.  11. History of CVA. Follow-up per neurology services and advised to continue eliquis. 12. Recent left rotator cuff surgery. Per Dr. Tamera Punt nonweightbearing left upper extremity except to assist with standing and able to use rolling walker. No pulling or pushing with left upper extremity. Unrestricted passive range of motion active range of motion. No strengthening restrictions 13. Acute on chronic anemia. Latest transfusion 08/07/2017.  hgb up to 9.7 today 14. COPD/tobacco abuse.  15. CKD  Cr 1.32  LOS (Days) 10 A Templeton T, MD 08/17/2017 8:47 AM

## 2017-08-17 NOTE — Progress Notes (Signed)
Physical Therapy Session Note  Patient Details  Name: Brandon [name] MRN: [id number] Date of Birth: October 21, 1946  Today's Date: 08/17/2017 PT Individual Time: 1615-1700 PT Individual Time Calculation (min): 45 min   Short Term Goals: Week 2:  PT Short Term Goal 1 (Week 2): = LTG due to length of stay  Skilled Therapeutic Interventions/Progress Updates:   Pt received sitting in [facility] and agreeable to PT  PT instucted family in safe supervision transfer technique to and from BR with RW. Min cues for possible safety cues that family could provide.    Gait training in hall x 138ft, 253ft, and 114ft with RW and supervision assist from PT. Min cues for navigation and safety in turns.   Forward Step up/down 6 inch step x 8 BLE with BUE support on rail. PT provided min assist intermittently at the R knee to prevent Genu recurvatum and improve knee control. Lateral step up/down in parallel bars to 6 inch step with min assist.   Standing BLE therex;  Terminal knee extension 3 x 20 with level 4 tband.    sit<>stand with 2 inch step under LLE to force increased use of RLE. 3 x 5 minisquat from standing with BUE on RW 3x 10   Therapy Documentation Precautions:  Precautions Precautions: Shoulder, Fall Precaution Comments: Per Dr Tamera Punt (pt s/p Lt RCR early June) NWB Lt UE except to assist with standing and able to use RW; No pulling or pushing with Lt UE;  unrestricted PROM/AROM is okay.  No strengthening   Restrictions Weight Bearing Restrictions: Yes LUE Weight Bearing: Non weight bearing Vital Signs: Therapy Vitals Temp: 98.1 F (36.7 C) Temp Source: Oral Pulse Rate: 82 Resp: 18 BP: (!) 119/54 Patient Position (if appropriate): Lying Oxygen Therapy SpO2: 99 % O2 Device: Not Delivered Pain 0/10   See Function Navigator for Current Functional Status.   Therapy/Group: Individual Therapy  Lorie Phenix 08/17/2017, 5:04 PM

## 2017-08-17 NOTE — Progress Notes (Signed)
Occupational Therapy Session Note  Patient Details  Name: ZAMARIAN SCARANO MRN: [id number] Date of Birth: 1946-06-14  Today's Date: 08/17/2017 OT Individual Time: 3403-7096 OT Individual Time Calculation (min): 55 min    Short Term Goals: Week 2:  OT Short Term Goal 1 (Week 2): STG=LTG secondary to ELOS  Skilled Therapeutic Interventions/Progress Updates:    Pt resting in w/c upon arrival eating breakfast while wife present.  Pt engaged in BADL retraining including bathing at shower level and dressing with sit<>stand from w/c.  Focus on functional amb with RW, functional tranfsers, sit<>stand, standing balance, sequencing, activity tolerance, and safety awareness.  Pt continues to fatigue easily and requires multiple rest breaks during activities.  Pt stood at sink to complete grooming tasks, including cleaning teeth, combing hair, and shaving with electric razor.  Pt's wife present during therapy session.  Pt continues to require min verbal cues for safety awareness.    Therapy Documentation Precautions:  Precautions Precautions: Shoulder, Fall Precaution Comments: Per Dr Tamera Punt (pt s/p Lt RCR early June) NWB Lt UE except to assist with standing and able to use RW; No pulling or pushing with Lt UE;  unrestricted PROM/AROM is okay.  No strengthening   Restrictions Weight Bearing Restrictions: Yes LUE Weight Bearing: Non weight bearing  Pain:  Pt denies pain  See Function Navigator for Current Functional Status.   Therapy/Group: Individual Therapy  Leroy Libman 08/17/2017, 8:57 AM

## 2017-08-17 NOTE — Patient Care Conference (Signed)
Inpatient RehabilitationTeam Conference and Plan of Care Update Date: 08/15/2017   Time: 2:00 PM    Patient Name: Brandon [name]      Medical Record Number: [id number]  Date of Birth: 09/11/1946 Sex: Male         Room/Bed: 4W11C/4W11C-01 Payor Info: Payor: [employer] MEDICARE / Plan: [employer] MEDICARE HMO / Product Type: *No Product type* /    Admitting Diagnosis: Aortic stenosis  Admit Date/Time:  08/07/2017  3:58 PM Admission Comments: No comment available   Primary Diagnosis:  <principal problem not specified> Principal Problem: <principal problem not specified>  Patient Active Problem List   Diagnosis Date Noted  . Acute on chronic diastolic (congestive) heart failure ([place])   . Dysphagia, post-stroke   . Stage 3 chronic kidney disease   . Debility 08/07/2017  . Tobacco abuse   . Acute blood loss anemia   . Anemia of chronic disease   . History of CVA (cerebrovascular accident)   . Adjustment disorder with mixed anxiety and depressed mood   . Chronic low back pain   . Dysphagia   . Agitation   . Pressure injury of skin 07/20/2017  . Ventilator dependence ([place])   . Goals of care, counseling/discussion   . Palliative care encounter   . Acute respiratory failure with hypoxemia ([place])   . Acute pulmonary edema ([facility])   . Acute on chronic diastolic CHF (congestive heart failure) ([place])   . Hypotensive episode   . Arrhythmia 07/13/2017  . Aortic stenosis, moderate 07/13/2017  . Anemia 07/13/2017  . Dyspnea 07/13/2017  . Respiratory distress 07/13/2017  . S/P left rotator cuff repair 05/18/2017  . Non-rheumatic mitral regurgitation   . HOH (hard of hearing) 06/02/2013  . Obesity (BMI 30-39.9) 06/02/2013  . Acute cholecystitis with chronic cholecystitis 06/01/2013  . Calculus of bile duct without mention of cholecystitis or obstruction 05/31/2013  . Nonspecific elevation of levels of transaminase or lactic acid dehydrogenase (LDH) 05/31/2013  . Nonspecific (abnormal) findings on  radiological and other examination of biliary tract 05/31/2013  . TOBACCO ABUSE 02/22/2010  . DYSLIPIDEMIA 06/25/2009  . MITRAL INSUFFICIENCY 06/25/2009  . Essential hypertension 06/25/2009  . Aortic valve disorder 06/25/2009  . VALVULAR HEART DISEASE 06/25/2009  . Cardiovascular disease 06/25/2009  . CEREBROVASCULAR DISEASE 06/25/2009  . CEREBROVASCULAR ACCIDENT WITH RIGHT HEMIPARESIS 06/25/2009  . COPD exacerbation ([place]) 06/25/2009  . FOOT SURGERY, HX OF 06/25/2009  . POLYPECTOMY, HX OF 06/25/2009  . TONSILLECTOMY, HX OF 06/25/2009    Expected Discharge Date: Expected Discharge Date: 08/22/17  Team Members Present: Physician leading conference: Dr. Alger Simons Social Worker Present: Alfonse Alpers, LCSW Nurse Present: Other (comment) Felizardo Hoffmann Troxler, RN) PT Present: Kem Parkinson, PT OT Present: Willeen Cass, OT;Roanna Epley, COTA SLP Present: Stormy Fabian, SLP PPS Coordinator present : Daiva Nakayama, RN, CRRN     Current Status/Progress Goal Weekly Team Focus  Medical   improving cognitive status, weight picking up with increased appetite. sleeping better  improve volume status  weight mgt, nutrition, sleep, bp control   Bowel/Bladder   incontinent bowel/bladder. LBM 07/14/17  Continent of bowel/bladder with min assist  Tioleting schedule   Swallow/Nutrition/ Hydration   Mod to Min with dysphagia 3 and thin liquids  Min to supervision with least restrictive diet  completion of Modified Barium Swallow Study - upgrade to dysphagia 3 with thin liquids via cup   ADL's   steady A/supervision overall, mod verbal cues for safety awareness  Supervision overall  family education, cognitive remediation,  functoinal mobility/balance   Mobility   S bed mobility, minA sit <>stand and min guard gait with RW up to 150', minA stairs  S overall with RW  activity tolerance, LE strengthening, dynamic standing balance   Communication   Min A for sentence to simple conversation   Supervision  RMT, intelligibility strategies at the sentence level   Safety/Cognition/ Behavioral Observations  Mod A for basic  Min A  attention, basic problem solving   Pain   No c/o pain  <2  Assess and treat pain q shift and as needed   Skin   MASD bott, Barrer cream  No further skin breakdown  Assess skin q shift and as needed    Rehab Goals Patient on target to meet rehab goals: Yes Rehab Goals Revised: none *See Care Plan and progress notes for long and short-term goals.     Barriers to Discharge  Current Status/Progress Possible Resolutions Date Resolved   Physician    Medical stability        balancd volume status, maximize nutritional status      Nursing                  PT                    OT                  SLP Behavior              SW                Discharge Planning/Teaching Needs:  Pt to return to his home with his wife to provide supervision.  Pt's wife to participate in family education closer to d/c.   Team Discussion:  Pt is progressing and volumes are better.  Pt is eating more and is cognitively improving and sleeping better.  Still has some back pain.  Per RN, pt is doing well and taking some pain meds.  Pt is min A overall with PT and they are working on increasing gain and endurance.  Pt is progressing with ADLs per OT and needs some cues with sequencing in the shower and has some attention deficits.  ST reports moderate deficits.  Pt on thin liquids now and has poor safety awareness.  Revisions to Treatment Plan:  none    Continued Need for Acute Rehabilitation Level of Care: The patient requires daily medical management by a physician with specialized training in physical medicine and rehabilitation for the following conditions: Daily direction of a multidisciplinary physical rehabilitation program to ensure safe treatment while eliciting the highest outcome that is of practical value to the patient.: Yes Daily medical management of patient  stability for increased activity during participation in an intensive rehabilitation regime.: Yes Daily analysis of laboratory values and/or radiology reports with any subsequent need for medication adjustment of medical intervention for : Neurological problems;Cardiac problems  Briley Sulton, Silvestre Mesi 08/17/2017, 10:16 AM

## 2017-08-18 ENCOUNTER — Inpatient Hospital Stay (HOSPITAL_COMMUNITY): Payer: Medicare HMO | Admitting: Physical Therapy

## 2017-08-18 ENCOUNTER — Inpatient Hospital Stay (HOSPITAL_COMMUNITY): Payer: Medicare HMO | Admitting: Speech Pathology

## 2017-08-18 ENCOUNTER — Inpatient Hospital Stay (HOSPITAL_COMMUNITY): Payer: Medicare HMO

## 2017-08-18 LAB — GLUCOSE, CAPILLARY
GLUCOSE-CAPILLARY: 99 mg/dL (ref 65–99)
GLUCOSE-CAPILLARY: 84 mg/dL (ref 65–99)
GLUCOSE-CAPILLARY: 106 mg/dL — AB (ref 65–99)
Glucose-Capillary: 122 mg/dL — ABNORMAL HIGH (ref 65–99)

## 2017-08-18 NOTE — Progress Notes (Signed)
Occupational Therapy Session Note  Patient Details  Name: Brandon [name] MRN: [id number] Date of Birth: 1946-04-04  Today's Date: 08/18/2017 OT Individual Time: 0800-0900 OT Individual Time Calculation (min): 60 min    Short Term Goals: Week 2:  OT Short Term Goal 1 (Week 2): STG=LTG secondary to ELOS  Skilled Therapeutic Interventions/Progress Updates:    Pt resting in w/c upon arrival with wife present.  Pt initially engaged in BADL retraining including bathing at shower level and dressing with sit<>stand from w/c.  Pt completed all bathing/dressing tasks at supervision level with multiple rest breaks.  Pt engaged in table activity with pipe tree tasks of increasing difficulty.  Pt appropriately and correctly completed 2 structures employing appropriate problem solving strategies. Pt remained in w/c with wife present and all needs within reach.   Therapy Documentation Precautions:  Precautions Precautions: Shoulder, Fall Precaution Comments: Per Dr Tamera Punt (pt s/p Lt RCR early June) NWB Lt UE except to assist with standing and able to use RW; No pulling or pushing with Lt UE;  unrestricted PROM/AROM is okay.  No strengthening   Restrictions Weight Bearing Restrictions: Yes LUE Weight Bearing: Non weight bearing Pain:  Pt denies pain  See Function Navigator for Current Functional Status.   Therapy/Group: Individual Therapy  Leroy Libman 08/18/2017, 9:02 AM

## 2017-08-18 NOTE — Progress Notes (Signed)
[place] PHYSICAL MEDICINE & REHABILITATION     PROGRESS NOTE  Subjective/Complaints: Feeling well. No new complaints over night. Wife happy that she's signed off for transfers to the bathroom.  ROS: pt denies nausea, vomiting, diarrhea, cough, shortness of breath or chest pain   Vital Signs: Blood pressure (!) 123/55, pulse 96, temperature 98.1 F (36.7 C), temperature source Oral, resp. rate 17, height 5\' 9"  (1.753 m), weight 102 kg (224 lb 13.9 oz), SpO2 96 %. No results found.  Recent Labs  08/16/17 0540  WBC 9.7  HGB 9.7*  HCT 30.8*  PLT 405*    Recent Labs  08/16/17 0540  NA 135  K 4.7  CL 108  GLUCOSE 100*  BUN 15  CREATININE 1.32*  CALCIUM 8.7*   CBG (last 3)   Recent Labs  08/17/17 1710 08/17/17 2155 08/18/17 0728  GLUCAP 128* 104* 99    Wt Readings from Last 3 Encounters:  08/18/17 102 kg (224 lb 13.9 oz)  08/07/17 101.9 kg (224 lb 9.6 oz)  05/18/17 114.3 kg (252 lb)    Physical Exam:   Constitutional: He appears well-developed. No distress Obese  HENT: Normocephalic, atraumatic  Eyes: EOMI. No discharge. Cardiovascular: RRR without murmur. No JVD  Respiratory: CTA Bilaterally without wheezes or rales. Normal effort  GI: BS+, ND.  Musculoskeletal: He exhibits no peripheral edema. He exhibits no tenderness.  Neurological: He is alert, oriented to place, person/time HOH.  Motor: RUE:  4-/5 right deltoid, bicep, tricep, hand grip Left upper extremity: 4-/5 strength in the deltoid, bicep, tricep, hand grip (stable) Left lower extremity:  4-/5 at the hip flexors, knee extensors, ankle dorsiflexors.  Skin: Skin is warm and dry.  Psychiatric: pleasant and cooperative. More interactive  Assessment/Plan: 1. Functional deficits secondary to debility/prior CVA which require 3+ hours per day of interdisciplinary therapy in a comprehensive inpatient rehab setting. Physiatrist is providing close team supervision and 24 hour management of active  medical problems listed below. Physiatrist and rehab team continue to assess barriers to discharge/monitor patient progress toward functional and medical goals.  Function:  Bathing Bathing position Bathing activity did not occur: Refused Position: Production manager parts bathed by patient: Right arm, Left arm, Chest, Abdomen, Front perineal area, Buttocks, Right upper leg, Left upper leg, Right lower leg, Left lower leg Body parts bathed by helper: Right lower leg, Left lower leg, Back  Bathing assist Assist Level: Supervision or verbal cues      Upper Body Dressing/Undressing Upper body dressing   What is the patient wearing?: Pull over shirt/dress     Pull over shirt/dress - Perfomed by patient: Thread/unthread right sleeve, Thread/unthread left sleeve, Put head through opening, Pull shirt over trunk Pull over shirt/dress - Perfomed by helper: Pull shirt over trunk        Upper body assist Assist Level: Supervision or verbal cues      Lower Body Dressing/Undressing Lower body dressing   What is the patient wearing?: Underwear, Pants, Non-skid slipper socks, Shoes, [employer]  - Performed by patient: Thread/unthread right underwear leg, Thread/unthread left underwear leg, Pull underwear up/down Underwear - Performed by helper: Thread/unthread left underwear leg Pants- Performed by patient: Thread/unthread right pants leg, Thread/unthread left pants leg, Pull pants up/down Pants- Performed by helper: Thread/unthread right pants leg, Thread/unthread left pants leg, Pull pants up/down Non-skid slipper socks- Performed by patient: Don/doff right sock, Don/doff left sock Non-skid slipper socks- Performed by helper: Don/doff right sock, Don/doff left sock  Shoes - Performed by patient: Don/doff right shoe, Don/doff left shoe, Fasten right, Fasten left         TED Hose - Performed by helper: Don/doff right TED hose, Don/doff left TED hose  Lower body assist  Assist for lower body dressing: Supervision or verbal cues      Toileting Toileting   Toileting steps completed by patient: Adjust clothing prior to toileting, Adjust clothing after toileting (urination only) Toileting steps completed by helper: Adjust clothing after toileting Toileting Assistive Devices: Grab bar or rail  Toileting assist Assist level: Touching or steadying assistance (Pt.75%)   Transfers Chair/bed transfer   Chair/bed transfer method: Ambulatory Chair/bed transfer assist level: Supervision or verbal cues Chair/bed transfer assistive device: Medical sales representative     Max distance: 100 Assist level: Supervision or verbal cues   Wheelchair          Cognition Comprehension Comprehension assist level: Understands basic 75 - 89% of the time/ requires cueing 10 - 24% of the time  Expression Expression assist level: Expresses basic 75 - 89% of the time/requires cueing 10 - 24% of the time. Needs helper to occlude trach/needs to repeat words.  Social Interaction Social Interaction assist level: Interacts appropriately 75 - 89% of the time - Needs redirection for appropriate language or to initiate interaction.  Problem Solving Problem solving assist level: Solves basic 75 - 89% of the time/requires cueing 10 - 24% of the time  Memory Memory assist level: Recognizes or recalls 50 - 74% of the time/requires cueing 25 - 49% of the time    Medical Problem List and Plan: 1.  Debilitation and right upper extremity weakness due to history of CVA/acute hypoxemic respiratory failure. Patient was extubated 07/25/2017  -continue CIR  -making continual functional progress 2.  DVT Prophylaxis/Anticoagulation: Eliquis. Monitor for any increased bleeding episodes 3. Pain Management/chronic back pain: Tylenol as needed. Patient on oxycodone 5-325mg  1 to 2 tabs every 4 hours as needed prior to admission. Resume as needed  -limiting neuro-sedating meds  -  kpad for low  back pain 4. Mood : Provide emotional support. Patient on Wellbutrin 150 mg twice a day as well as Celexa 20 mg daily prior to admission.   -resumed celexa 10mg  QHS  -continue scheduled trazodone  -confusion has largely resolved 5. Neuropsych: This patient is not capable of making decisions on his own behalf. 6. Skin/Wound Care: Routine skin checks 7. Fluids/Electrolytes/Nutrition: encourage fluids  -continue to encourage fluids  -change potass supp to capsule  -check labs monday 8. Acute on chronic diastolic congestive heart failure.   -increased lasix to 40mg  daily.   -weights inconsistent in chart  -appears euvolemic for the most part  -some of increased weight is due to improved po intake  -pt is +6500 for admit, but outs are not being recorded consistently (0 yesterday for instance)  Filed Weights   08/16/17 0329 08/17/17 0430 08/18/17 0526  Weight: 102.1 kg (225 lb) 99.8 kg (220 lb) 102 kg (224 lb 13.9 oz)   - Follow-up per cardiology services as needed 9. Atrial fibrillation. Continue Eliquis. Amiodarone 200 mg twice a day 10. Dysphagia. Dysphagia #3 thin liquids   -protein supp   -intake better.  11. History of CVA. Follow-up per neurology services and advised to continue eliquis. 12. Recent left rotator cuff surgery. Per Dr. Tamera Punt nonweightbearing left upper extremity except to assist with standing and able to use rolling walker. No pulling or pushing with left  upper extremity. Unrestricted passive range of motion active range of motion. No strengthening restrictions 13. Acute on chronic anemia. Latest transfusion 08/07/2017.    -hgb up to 9.7    -recheck monday 14. COPD/tobacco abuse.  15. CKD  Cr 1.32   LOS (Days) 11 A FACE TO FACE EVALUATION WAS PERFORMED  Meredith Staggers, MD 08/18/2017 8:42 AM

## 2017-08-18 NOTE — Progress Notes (Signed)
Speech Language Pathology Daily Session Note  Patient Details  Name: Brandon [name] MRN: [id number] Date of Birth: [birth year]  Today's Date: 08/18/2017 SLP Individual Time: 1100-1130 SLP Individual Time Calculation (min): 30 min  Short Term Goals: Week 2: SLP Short Term Goal 1 (Week 2): Pt will increase vocal intensity at sentence level to achieve 90% intelligibilty with Min verbal cues.  SLP Short Term Goal 2 (Week 2): Pt will demonstrate intellectual awareness and identify 2 physical and 2 cognitive deficits given Max verbal cues.  SLP Short Term Goal 3 (Week 2): Pt will complete basic, familar tasks with Mod verbal and question cues for functional problem solving. SLP Short Term Goal 4 (Week 2): Pt will self-monitor and  self-correct verbal and fucntional errors with Max A multimodal cues.  SLP Short Term Goal 5 (Week 2): Pt will consume current diet without s/s aspiration with Supervision cues to use swallowing compensatory strategies. SLP Short Term Goal 6 (Week 2): Pt will return demonstration of EMT device with Min A verbal cues for 25 repetitions.   Skilled Therapeutic Interventions: Skilled treatment session focused on cognition goals. SLP facilitated session by providing Min A to supervision for completion of complex problem solving game. Caregiver education provided to wife and pt on need for 24 hour supervision. All voiced understanding. Pt continues with off-topic (unexplained) comments. For example at end of session, pt commented that "he didn't want to electrocute anyone." Wife is aware of these cognitive discrepancies. Pt was returned to room, left upright in his wheelchair with all needs within reach.      Function:  Eating Eating   Modified Consistency Diet: Yes             Cognition Comprehension Comprehension assist level: Understands basic 75 - 89% of the time/ requires cueing 10 - 24% of the time;Understands basic 90% of the time/cues < 10% of the time   Expression   Expression assist level: Expresses basic 90% of the time/requires cueing < 10% of the time.  Social Interaction Social Interaction assist level: Interacts appropriately 75 - 89% of the time - Needs redirection for appropriate language or to initiate interaction.  Problem Solving Problem solving assist level: Solves basic 90% of the time/requires cueing < 10% of the time  Memory Memory assist level: Recognizes or recalls 75 - 89% of the time/requires cueing 10 - 24% of the time    Pain Pain Assessment Pain Assessment: No/denies pain  Therapy/Group: Individual Therapy  Sophiamarie Nease 08/18/2017, 3:19 PM

## 2017-08-18 NOTE — Progress Notes (Signed)
Physical Therapy Session Note  Patient Details  Name: Brandon [name] MRN: [id number] Date of Birth: Jul 20, 1946  Today's Date: 08/18/2017 PT Individual Time: 1000-1100, 1300-1345 PT Individual Time Calculation (min): 60 min, 45 min   Short Term Goals: Week 2:  PT Short Term Goal 1 (Week 2): = LTG due to length of stay  Skilled Therapeutic Interventions/Progress Updates:    Session 1: Pt received sitting up in wheelchair, agreeable to therapy. Wife present for therapy session. Pt ambulated 150 ft to gym with RW and supervision. Pt performed bilat sidelying clam shells 3 sets, 15 reps. TrA activation with hip flexion 3 sets, 20 reps. Pt supervision with all rolling and bed mobility on mat. Pt 2 sets 10 reps of step ups. Cues for glut activation. 3 sets 20 reps of hamstring curls (2lbs) with UE support. Pt ambulated back to room as above. Left sitting up in wheelchair, all needs in reach, wife present.   Session 2: Pt received sitting up in wheelchair, agreeable to therapy. Transferred outside total assist. Pt ambulated 200 ft across street, up/down ramps and curbs with RW and supervision. Up/down 4 stairs x 3 with 1 rail. Min guard for stairs. Pt performed 3 sets, 20 reps of LAQ (3 lbs). Returned to room total assist. Left sitting up in chair, all needs in reach, wife present.   Therapy Documentation Precautions:  Precautions Precautions: Shoulder, Fall Precaution Comments: Per Dr Tamera Punt (pt s/p Lt RCR early June) NWB Lt UE except to assist with standing and able to use RW; No pulling or pushing with Lt UE;  unrestricted PROM/AROM is okay.  No strengthening   Restrictions Weight Bearing Restrictions: Yes LUE Weight Bearing: Non weight bearing   See Function Navigator for Current Functional Status.   Therapy/Group: Individual Therapy  Gwinda Passe, SPT 08/18/2017, 2:34 PM

## 2017-08-19 LAB — GLUCOSE, CAPILLARY
GLUCOSE-CAPILLARY: 132 mg/dL — AB (ref 65–99)
GLUCOSE-CAPILLARY: 178 mg/dL — AB (ref 65–99)
Glucose-Capillary: 107 mg/dL — ABNORMAL HIGH (ref 65–99)
Glucose-Capillary: 123 mg/dL — ABNORMAL HIGH (ref 65–99)

## 2017-08-19 NOTE — Progress Notes (Signed)
Patient ID: Brandon [name], male   DOB: [birth year], 71 y.o.   MRN: [id number]   08/19/17.  Brandon [name] is a 71 y.o. male Admit for CIR with Debilitation and right upper extremity weakness due to history of CVA/acute hypoxemic respiratory failure. Patient was extubated 07/25/2017  Past Medical History:  Diagnosis Date  . Anemia 07/13/2017  . Anxiety   . Aortic insufficiency   . Aortic stenosis, moderate 07/13/2017  . Arthritis    back   . Carotid stenosis    Right carotid stent (widely patent) 40 - 59% left plaque 11/13  . Depression   . Dyslipidemia   . GERD (gastroesophageal reflux disease)   . Heart murmur   . Hemiplegia affecting unspecified side, late effect of cerebrovascular disease    resolved- from L side   . Hypertension   . Jaundice    resolved following ERCP & Cholecystectomy  . Mitral valve insufficiency and aortic valve insufficiency   . Pre-diabetes    per spouse  . Sleep apnea    does not wear CPAP  . Sleep concern    resulted in surgery- after + sleep test. Pt. doesn't have a problem any longer.   . Stroke ([place]) 03/11/2003   stent placed on the 31, 3, 2004, L side   . Wears glasses   . Wears hearing aid in both ears   . Wears partial dentures      Subjective: No new complaints. No new problems. Slept well.   Objective: Vital signs in last 24 hours: Temp:  [98.6 F (37 C)] 98.6 F (37 C) (09/08 0331) Pulse Rate:  [81-95] 95 (09/08 0331) Resp:  [17-18] 18 (09/08 0331) BP: (123-130)/(55-61) 130/61 (09/08 0331) SpO2:  [99 %-100 %] 100 % (09/08 0331) Weight:  [230 lb (104.3 kg)] 230 lb (104.3 kg) (09/08 0331) Weight change: 5 lb 2.1 oz (2.327 kg) Last BM Date: 08/18/17  Intake/Output from previous day: 09/07 0701 - 09/08 0700 In: 81 [P.O.:780] Out: -  Last cbgs: CBG (last 3)   Recent Labs  08/18/17 1655 08/18/17 2039 08/19/17 0712  GLUCAP 106* 122* 107*   BP Readings from Last 3 Encounters:  08/19/17 130/61  08/07/17 (!) 120/51   05/19/17 (!) 157/58     Physical Exam General: No apparent distress  Obese HEENT: not dry Lungs: Normal effort. Lungs clear to auscultation, no crackles or wheezes. Cardiovascular: Regular rate and rhythm, no edema  Gr 2/6 SEM Abdomen: S/NT/ND; BS(+) Musculoskeletal:  unchanged Neurological: No new neurological deficits;  Mild RUE weakness Wounds: N/A    Extremities- no edema Mental state: Alert, oriented, cooperative    Lab Results: BMET    Component Value Date/Time   NA 135 08/16/2017 0540   K 4.7 08/16/2017 0540   CL 108 08/16/2017 0540   CO2 21 (L) 08/16/2017 0540   GLUCOSE 100 (H) 08/16/2017 0540   BUN 15 08/16/2017 0540   CREATININE 1.32 (H) 08/16/2017 0540   CALCIUM 8.7 (L) 08/16/2017 0540   GFRNONAA 53 (L) 08/16/2017 0540   GFRAA >60 08/16/2017 0540   CBC    Component Value Date/Time   WBC 9.7 08/16/2017 0540   RBC 3.14 (L) 08/16/2017 0540   HGB 9.7 (L) 08/16/2017 0540   HCT 30.8 (L) 08/16/2017 0540   PLT 405 (H) 08/16/2017 0540   MCV 98.1 08/16/2017 0540   MCH 30.9 08/16/2017 0540   MCHC 31.5 08/16/2017 0540   RDW 17.8 (H) 08/16/2017 0540   LYMPHSABS 1.7  08/08/2017 0342   MONOABS 0.8 08/08/2017 0342   EOSABS 0.1 08/08/2017 0342   BASOSABS 0.0 08/08/2017 0342    Studies/Results: No results found.  Medications: I have reviewed the patient's current medications.  Assessment/Plan:  Debilitation and right upper extremity weakness due to history of CVA/acute hypoxemic respiratory failure. Patient was extubated 07/25/2017. Continue CIR A Fib. Continue Eliquis CKD- stable Chronic DHR- compensated  Length of stay, days: [place] , MD 08/19/2017, 10:10 AM

## 2017-08-20 ENCOUNTER — Inpatient Hospital Stay (HOSPITAL_COMMUNITY): Payer: Medicare HMO

## 2017-08-20 LAB — GLUCOSE, CAPILLARY
GLUCOSE-CAPILLARY: 121 mg/dL — AB (ref 65–99)
Glucose-Capillary: 124 mg/dL — ABNORMAL HIGH (ref 65–99)
Glucose-Capillary: 117 mg/dL — ABNORMAL HIGH (ref 65–99)
Glucose-Capillary: 135 mg/dL — ABNORMAL HIGH (ref 65–99)

## 2017-08-20 NOTE — Progress Notes (Signed)
Occupational Therapy Session Note  Patient Details  Name: Brandon [name] MRN: [id number] Date of Birth: [birth year]  Today's Date: 08/20/2017 OT Individual Time: 1415-1445 OT Individual Time Calculation (min): 30 min    Short Term Goals: Week 1:  OT Short Term Goal 1 (Week 1): Pt will be able to transfer on and off toilet with min A. OT Short Term Goal 1 - Progress (Week 1): Met OT Short Term Goal 2 (Week 1): Pt will bathe self with min A. OT Short Term Goal 2 - Progress (Week 1): Met OT Short Term Goal 3 (Week 1): Pt will don shirt with supervision. OT Short Term Goal 3 - Progress (Week 1): Met OT Short Term Goal 4 (Week 1): Pt will don pants with min A. OT Short Term Goal 4 - Progress (Week 1): Met OT Short Term Goal 5 (Week 1): Pt will demonstrate improved attention to ADL task for at least 5 minutes at a time.  OT Short Term Goal 5 - Progress (Week 1): Met  Skilled Therapeutic Interventions/Progress Updates:    1:1. Focus of session on functional endurance and standing balance. Pt propels w/c with supervision and VC for steering to improve BUE strength. Pt stand pivot transfer w/c<>EOB with VC for RW management and safety awareness. Pt completes 3x30 passes of basketball (chest, bounce, overhead passes) with wrist weights on BUE. First round of passes completed in sitting, second round standing with supervision and last round standing on foam pad with CGA and tactile cues for posture to improve standing balance and BUE coordination/strength required for ADLs. Exited session iwht pt seated in w/c with call light inreahc and wife present in room.  Therapy Documentation Precautions:  Precautions Precautions: Shoulder, Fall Precaution Comments: Per Dr Tamera Punt (pt s/p Lt RCR early June) NWB Lt UE except to assist with standing and able to use RW; No pulling or pushing with Lt UE;  unrestricted PROM/AROM is okay.  No strengthening   Restrictions Weight Bearing Restrictions: Yes LUE  Weight Bearing: Non weight bearing  See Function Navigator for Current Functional Status.   Therapy/Group: Individual Therapy  [name] 08/20/2017, 2:43 PM

## 2017-08-20 NOTE — Progress Notes (Signed)
Patient ID: Brandon [name], male   DOB: 15-Oct-1946, 71 y.o.   MRN: [id number]   08/20/17.  Brandon [name] is a 71 y.o. male Admit for CIR with Debilitation and right upper extremity weakness due to history of CVA/acute hypoxemic respiratory failure. Patient was extubated 07/25/2017  Past Medical History:  Diagnosis Date  . Anemia 07/13/2017  . Anxiety   . Aortic insufficiency   . Aortic stenosis, moderate 07/13/2017  . Arthritis    back   . Carotid stenosis    Right carotid stent (widely patent) 40 - 59% left plaque 11/13  . Depression   . Dyslipidemia   . GERD (gastroesophageal reflux disease)   . Heart murmur   . Hemiplegia affecting unspecified side, late effect of cerebrovascular disease    resolved- from L side   . Hypertension   . Jaundice    resolved following ERCP & Cholecystectomy  . Mitral valve insufficiency and aortic valve insufficiency   . Pre-diabetes    per spouse  . Sleep apnea    does not wear CPAP  . Sleep concern    resulted in surgery- after + sleep test. Pt. doesn't have a problem any longer.   . Stroke ([place]) 03/11/2003   stent placed on the 31, 3, 2004, L side   . Wears glasses   . Wears hearing aid in both ears   . Wears partial dentures      Subjective: No new complaints. No new problems. Slept well.   Objective: Vital signs in last 24 hours: Temp:  [98.2 F (36.8 C)-99 F (37.2 C)] 99 F (37.2 C) (09/09 0320) Pulse Rate:  [83] 83 (09/09 0320) Resp:  [18] 18 (09/09 0320) BP: (111-129)/(42-52) 111/42 (09/09 0320) SpO2:  [98 %] 98 % (09/09 0320) Weight:  [227 lb 1.2 oz (103 kg)] 227 lb 1.2 oz (103 kg) (09/09 0320) Weight change: -2 lb 14.8 oz (-1.327 kg) Last BM Date: 08/18/17  Intake/Output from previous day: 09/08 0701 - 09/09 0700 In: 840 [P.O.:840] Out: -  Last cbgs: CBG (last 3)   Recent Labs  08/19/17 1632 08/19/17 2119 08/20/17 0633  GLUCAP 123* 178* 124*   BP Readings from Last 3 Encounters:  08/20/17 (!) 111/42   08/07/17 (!) 120/51  05/19/17 (!) 157/58    Physical Exam General: No apparent distress   HEENT: not dry Lungs: Normal effort. Lungs clear to auscultation, no crackles or wheezes. Cardiovascular: Regular rate and rhythm, no edema; Gr 3/6 SEM Abdomen: S/NT/ND; BS(+) Musculoskeletal:  unchanged Neurological: No new neurological deficits with RUE weakness Wounds: N/A    Extremites- no edema Mental state: Alert, oriented, cooperative    Lab Results: BMET    Component Value Date/Time   NA 135 08/16/2017 0540   K 4.7 08/16/2017 0540   CL 108 08/16/2017 0540   CO2 21 (L) 08/16/2017 0540   GLUCOSE 100 (H) 08/16/2017 0540   BUN 15 08/16/2017 0540   CREATININE 1.32 (H) 08/16/2017 0540   CALCIUM 8.7 (L) 08/16/2017 0540   GFRNONAA 53 (L) 08/16/2017 0540   GFRAA >60 08/16/2017 0540   CBC    Component Value Date/Time   WBC 9.7 08/16/2017 0540   RBC 3.14 (L) 08/16/2017 0540   HGB 9.7 (L) 08/16/2017 0540   HCT 30.8 (L) 08/16/2017 0540   PLT 405 (H) 08/16/2017 0540   MCV 98.1 08/16/2017 0540   MCH 30.9 08/16/2017 0540   MCHC 31.5 08/16/2017 0540   RDW 17.8 (H) 08/16/2017 0540  LYMPHSABS 1.7 08/08/2017 0342   MONOABS 0.8 08/08/2017 0342   EOSABS 0.1 08/08/2017 0342   BASOSABS 0.0 08/08/2017 0342    Medications: I have reviewed the patient's current medications.  Assessment/Plan:   Debilitation and right upper extremity weakness due to history of CVA/acute hypoxemic respiratory failure. Patient was extubated 07/25/2017. Continue CIR A Fib. Continue Eliquis HTN- controlled CKD- stable Aortic stenosis Chronic DHR- compensated   Length of stay, days: [place] , MD 08/20/2017, 9:47 AM

## 2017-08-21 ENCOUNTER — Inpatient Hospital Stay (HOSPITAL_COMMUNITY): Payer: Medicare HMO | Admitting: Physical Therapy

## 2017-08-21 ENCOUNTER — Inpatient Hospital Stay (HOSPITAL_COMMUNITY): Payer: Medicare HMO

## 2017-08-21 LAB — CBC
HEMATOCRIT: 30 % — AB (ref 39.0–52.0)
HEMOGLOBIN: 9.3 g/dL — AB (ref 13.0–17.0)
MCH: 29.9 pg (ref 26.0–34.0)
MCHC: 31 g/dL (ref 30.0–36.0)
MCV: 96.5 fL (ref 78.0–100.0)
Platelets: 295 10*3/uL (ref 150–400)
RBC: 3.11 MIL/uL — ABNORMAL LOW (ref 4.22–5.81)
RDW: 17.1 % — ABNORMAL HIGH (ref 11.5–15.5)
WBC: 8.8 10*3/uL (ref 4.0–10.5)

## 2017-08-21 LAB — BASIC METABOLIC PANEL
ANION GAP: 6 (ref 5–15)
BUN: 24 mg/dL — ABNORMAL HIGH (ref 6–20)
CALCIUM: 8.9 mg/dL (ref 8.9–10.3)
CO2: 19 mmol/L — AB (ref 22–32)
Chloride: 110 mmol/L (ref 101–111)
Creatinine, Ser: 1.42 mg/dL — ABNORMAL HIGH (ref 0.61–1.24)
GFR calc Af Amer: 56 mL/min — ABNORMAL LOW (ref >60)
GFR calc non Af Amer: 48 mL/min — ABNORMAL LOW (ref >60)
GLUCOSE: 114 mg/dL — AB (ref 65–99)
Potassium: 5.4 mmol/L — ABNORMAL HIGH (ref 3.5–5.1)
Sodium: 135 mmol/L (ref 135–145)

## 2017-08-21 LAB — GLUCOSE, CAPILLARY
GLUCOSE-CAPILLARY: 110 mg/dL — AB (ref 65–99)
Glucose-Capillary: 114 mg/dL — ABNORMAL HIGH (ref 65–99)
Glucose-Capillary: 79 mg/dL (ref 65–99)
Glucose-Capillary: 128 mg/dL — ABNORMAL HIGH (ref 65–99)

## 2017-08-21 NOTE — Discharge Summary (Signed)
NAMESEVIN, Brandon [name]              ACCOUNT NO.:  [id number]  MEDICAL RECORD NO.:  [id number]  LOCATION:                                 FACILITY:  PHYSICIAN:  Meredith Staggers, M.D.     DATE OF BIRTH:  DATE OF ADMISSION:  08/07/2017 DATE OF DISCHARGE:  08/22/2017                              DISCHARGE SUMMARY   DISCHARGE DIAGNOSES: 1. Debilitation with right upper extremity weakness due to history of     cerebrovascular accident, acute hypoxemic respiratory failure. 2. deep vein thrombosis prophylaxis with Eliquis. 3. Chronic back pain. 4. Depression. 5. Acute on chronic diastolic congestive heart failure. 6. Atrial fibrillation. 7. Dysphagia. 8. History of cerebrovascular accident. 9. Recent left rotator cuff surgery. 10.Acute on chronic anemia. 11.Chronic obstructive pulmonary disease with tobacco abuse. 12.Chronic kidney disease stage 3. 13. Hyperkalemia  HISTORY OF PRESENT ILLNESS:  This is a 71 year old right-handed male with history of CVA with carotid stenting, aortic stenosis, tobacco abuse, recent rotator cuff surgery.  Presented on July 13, 2017, with nonspecific chest pain, diaphoresis, elevated troponin at 0.12.  The patient lives with spouse, reported to be independent prior to admission.  Chest x-ray with interval development of pulmonary edema, treated with intravenous heparin and Lasix.  Lopressor added for bouts of atrial fibrillation with RVR.  Heparin transitioned to Eliquis.  He did develop some agitation, received Xanax as well as Haldol. Echocardiogram with ejection fraction of 03%, grade 2 diastolic dysfunction.  The patient did require intubation for acute hypoxic respiratory failure.  TEE showed moderate LVH, moderate to severe mitral regurgitation.  Venous Doppler studies of lower extremities negative. He was extubated on July 25, 2017, with course complicated by pneumonia.  Antibiotic course completed.  Ongoing bouts of mental status, delirium.   Cranial CT scan, MRI showed no acute changes, old remote right MCA territory infarct.  Palliative Care also consulted to establish goals of care.  A nasogastric tube initially in place for nutritional support, diet slowly advanced.  Clarification of weightbearing status of left upper extremity after recent rotator cuff surgery, nonweightbearing except help with standing and using a rolling walker.  No pulling or pushing with left upper extremity.  Acute on chronic anemia at 7.4.  He was transfused 1 unit packed red blood cells on August 07, 2017.  The patient was admitted for comprehensive rehab program.  PAST MEDICAL HISTORY:  See discharge diagnoses.  SOCIAL HISTORY:  She lives with spouse, reported to be independent prior to admission.  FUNCTIONAL STATUS UPON ADMISSION TO REHAB SERVICES:  +2 physical assist sit to stand, +2 physical assist stand pivot transfers, max to total assist activities of daily living.  PHYSICAL EXAMINATION:  VITAL SIGNS:  Blood pressure 146/62, pulse 70, temperature 98, and respirations 19. GENERAL:  Alert male. HEENT:  Pupils are round and reactive to light.  Initially, a nasogastric tube was in place. CARDIAC:  Irregularly irregular. ABDOMEN:  Soft, nontender.  Good bowel sounds. LUNGS:  Decreased breath sounds at the bases with shallow respirations. PSYCHIATRIC:  Mood was flat.  Cognition impaired.  He was impulsive.  REHABILITATION HOSPITAL COURSE:  The patient was admitted to Inpatient Rehab Services.  Therapies initiated  on a 3-hour daily basis, consisting of physical therapy, occupational therapy, speech therapy, and rehabilitation nursing.  The following issues were addressed during the patient's rehabilitation stay.  Pertaining to Brandon [name] history of CVA, right upper extremity weakness, complicated by course of hypoxemic respiratory failure, remained stable.  He was progressing nicely with his therapies.  He remained on Eliquis for both  DVT prophylaxis, CVA prophylaxis, and atrial fibrillation.  Cardiac rate controlled, followed by Cardiology Services.  Chronic back pain, monitoring of mental status with any narcotics.  The patient had used oxycodone in the past.  Mood somewhat impulsive, he continued on Celexa, scheduled trazodone.  He exhibited no signs of fluid overload.  His Lasix was adjusted accordingly. Mild hyperkalemia 5.4 his potassium supplement was held.  Diet slowly advanced to mechanical soft thin liquids. Noted recent left rotator cuff surgery, followed by Dr. Tamera Punt.  No pulling or pushing with left upper extremity.  Acute on chronic anemia, hemoglobin A1c 9.7 and monitored.  He did have a history of COPD with tobacco abuse.  The patient and family received full counsel regard to cessation of nicotine products.  CKD stage 3, creatinine 1.32.  The patient received weekly collaborative interdisciplinary team conferences to discuss estimated length of stay, family teaching, any barriers to discharge.  The patient now ambulating 150 feet, rolling walker, supervision.  Performed bilateral side lying, 3 sets, 15 repetitions, supervision with all rolling and bed mobility on the mat, needed some cues.  Transferred wheelchair to mat.  Up and down ramps, rolling walker, supervision.  Up and down stairs, minimal guard.  Activities of daily living and homemaking, stand pivot transfers, wheelchair edge of bed, verbal cues, rolling walker, monitoring of safety awareness. Needed some assistance for lower body dressing.  Advised need for supervision for his safety.  Family teaching completed and plan discharge to home.  DISCHARGE MEDICATIONS: 1. Amiodarone 200 mg p.o. b.i.d. 2. Eliquis 5 mg p.o. b.i.d. 3. Celexa 10 mg p.o. at bedtime. 4. Ferrous sulfate 325 mg daily. 5. Lasix 40 mg p.o. daily. 6. Protonix 20 mg p.o. daily. 7. Senokot-S 2 tablets at bedtime. 8. Trazodone 50 mg p.o. at bedtime. 9.Ativan 0.5 mg  every 6 hours as needed anxiety, restlessness. 10.Oxycodone 1-2 tablets every 4 hours as needed pain. 11. Potassium chloride 20 mEq daily  DIET:  His diet was mechanical, soft, thin liquids.  FOLLOWUP:  The patient would follow up with Dr. Alger Simons at the Outpatient Rehab Service office as directed; Dr. Dorris Carnes, Cardiology Service, call for appointment; [facility] followup on August 24, 2017; Dr. Maurice Small, Medical Management.  SPECIAL INSTRUCTIONS:  No smoking.  Follow-up chemistries in one week to monitor potassium level.     Lauraine Rinne, P.A.   ______________________________ Meredith Staggers, M.D.    DA/MEDQ  D:  08/21/2017  T:  08/21/2017  Job:  [id number]  cc:   Fay Records, MD, [facility] Dr. Maurice Small Meredith Staggers, M.D.

## 2017-08-21 NOTE — Progress Notes (Signed)
Occupational Therapy Session Note  Patient Details  Name: Brandon [name] MRN: [id number] Date of Birth: January 29, 1946  Today's Date: 08/21/2017 OT Individual Time: 8592-9244 OT Individual Time Calculation (min): 55 min    Short Term Goals: Week 2:  OT Short Term Goal 1 (Week 2): STG=LTG secondary to ELOS  Skilled Therapeutic Interventions/Progress Updates:    Pt resting in w/c upon arrival with wife present.  Pt completed shower and dressing tasks at supervision level this morning.  Pt completed all grooming tasks while standing at sink.  Pt fatigues easily and becomes SOB, requiring rest breaks.  Discussed home safety and energy conservations strategies.  Pt and wife verbalized understanding of recommendations.  Pt remained in w/c with wife present and all needs within reach.   Therapy Documentation Precautions:  Precautions Precautions: Shoulder, Fall Precaution Comments: Per Dr Tamera Punt (pt s/p Lt RCR early June) NWB Lt UE except to assist with standing and able to use RW; No pulling or pushing with Lt UE;  unrestricted PROM/AROM is okay.  No strengthening   Restrictions Weight Bearing Restrictions: Yes LUE Weight Bearing: Non weight bearing  Pain:  Pt denies pain ADL: ADL ADL Comments: refer to functional navigator  See Function Navigator for Current Functional Status.   Therapy/Group: Individual Therapy  Leroy Libman 08/21/2017, 9:08 AM

## 2017-08-21 NOTE — Progress Notes (Signed)
Speech Language Pathology Discharge Summary  Patient Details  Name: Brandon [name] MRN: [id number] Date of Birth: 1946/06/26  Today's Date: 08/21/2017 SLP Individual Time: 0930-1020 SLP Individual Time Calculation (min): 50 min   Skilled Therapeutic Interventions: Skilled ST services focused on swallowing and cognitive skills. Pt returned demonstration of EMST exercises x 25 with supervision cues to continue repetitions, SLP educated wife and pt on home exercises and reason for continued RMT treatment to increase breath support.Pt demonstrated 90% intelligibility at sentence level with supervision cueing. Pt demonstrated recall of swallow precautions with Mod I. SLP facilitated PO consumption trials of regular textured foods with Mod I, educated pt and wife trials continue with outside services in order to reduce supervision and diet upgrade. Pt demonstrated basic problem solving ability given problem solving pictures with min-supervision A verbal cues. Pt demonstrates continued impairment in anticipatory awareness, stating he plans to work in the yard two days after discharge. SLP educated pt and wife about safety precautions and continuing therapy services to aid in functional independence, pt and wife demonstrated understanding. SLP provided education is swallow strategies, safety precautions, RMT exercises and problem solving. Pt was left in chair with wife in room.    Patient has met 5 of 5 long term goals.  Patient to discharge at overall Supervision;Min level.  Reasons goals not met:     Clinical Impression/Discharge Summary:  Pt demonstrated ability to met 5/5 ling term goals for swallowing, speech and cognitive skills. Pt demonstrates ability to preform swallow function with supervision cues to slow rate on dys 3 and thin liquids in order to reduce risk of aspiartion. Pt demonstrates ability communicate with proper breath support with supervision cues at sentence level progressing to  conversational level. Pt demonstrates ability to preform basic problem solving tasks, sustain attention and emergent awareness with min-supervision cueing. SLP recommends 24 hours assistance and follow up ST services to aid in safe swallow strategies, semi-complex problem solving, safety with continued deficits in anticipatory awareness and to continue RMT exercises to aid in breath support in order to maximize independence and reduce burden of care.  Care Partner:  Caregiver Able to Provide Assistance: Yes  Type of Caregiver Assistance: Physical;Cognitive  Recommendation:  Home Health SLP;24 hour supervision/assistance (safety and swallow to clear to regular textured foods)  Rationale for SLP Follow Up: Reduce caregiver burden;Maximize cognitive function and independence;Maximize swallowing safety   Equipment: none   Reasons for discharge: Treatment goals met;Discharged from hospital   Patient/Family Agrees with Progress Made and Goals Achieved: Yes   Function:  Eating Eating   Modified Consistency Diet: No Eating Assist Level: Supervision or verbal cues           Cognition Comprehension Comprehension assist level: Understands basic 75 - 89% of the time/ requires cueing 10 - 24% of the time  Expression   Expression assist level: Expresses basic 90% of the time/requires cueing < 10% of the time.  Social Interaction Social Interaction assist level: Interacts appropriately 75 - 89% of the time - Needs redirection for appropriate language or to initiate interaction.  Problem Solving Problem solving assist level: Solves basic 90% of the time/requires cueing < 10% of the time  Memory Memory assist level: Recognizes or recalls 75 - 89% of the time/requires cueing 10 - 24% of the time   Khalila Buechner  [facility] 08/21/2017, 12:44 PM

## 2017-08-21 NOTE — Progress Notes (Signed)
Occupational Therapy Discharge Summary  Patient Details  Name: Brandon [name] MRN: [id number] Date of Birth: February 04, 1946  Patient has met 13 of 13 long term goals due to improved activity tolerance, improved balance and ability to compensate for deficits.  Pt made steady progress with BADLs during this admission.  Pt completes bathing/dressing and toileiting tasks at supervision level.  Pt requires supervision for all functional transfers.  Pt and wife verbalize understanding of recommendation for 24 hour supervision.  Pt's wife has been present and participated in therapy sessions. Patient to discharge at overall Supervision level.  Patient's care partner is independent to provide the necessary physical and cognitive assistance at discharge.     Recommendation:  Patient will benefit from ongoing skilled OT services in home health setting to continue to advance functional skills in the area of BADL.  Equipment: tub transfer bench  Reasons for discharge: treatment goals met  Patient/family agrees with progress made and goals achieved: Yes  OT Discharge Precautions/Restrictions  Restrictions Weight Bearing Restrictions: Yes LUE Weight Bearing: Non weight bearing Vision Baseline Vision/History: Wears glasses Wears Glasses: At all times Patient Visual Report: No change from baseline Vision Assessment?: No apparent visual deficits Additional Comments: no visual deficits effecting mobility  Perception  Perception: Within Functional Limits Praxis Praxis: Intact Cognition Overall Cognitive Status: Within Functional Limits for tasks assessed Arousal/Alertness: Awake/alert Orientation Level: Oriented X4 Attention: Selective Sustained Attention: Appears intact Selective Attention: Appears intact Memory: Appears intact Awareness: Appears intact Problem Solving: Appears intact Safety/Judgment: Impaired Sensation Sensation Light Touch: Appears Intact Stereognosis: Appears  Intact Hot/Cold: Appears Intact Proprioception: Appears Intact Coordination Gross Motor Movements are Fluid and Coordinated: Yes Fine Motor Movements are Fluid and Coordinated: Yes Heel Shin Test: Quick movements, reduced ROM Motor  Motor Motor: Within Functional Limits    Balance Standardized Balance Assessment Standardized Balance Assessment: Berg Balance Test Berg Balance Test Sit to Stand: Able to stand  independently using hands Standing Unsupported: Able to stand safely 2 minutes Sitting with Back Unsupported but Feet Supported on Floor or Stool: Able to sit safely and securely 2 minutes Stand to Sit: Sits safely with minimal use of hands Transfers: Able to transfer safely, definite need of hands Standing Unsupported with Eyes Closed: Able to stand 10 seconds safely Standing Ubsupported with Feet Together: Able to place feet together independently and stand 1 minute safely From Standing, Reach Forward with Outstretched Arm: Can reach forward >12 cm safely (5") From Standing Position, Pick up Object from Floor: Able to pick up shoe, needs supervision From Standing Position, Turn to Look Behind Over each Shoulder: Looks behind from both sides and weight shifts well Turn 360 Degrees: Able to turn 360 degrees safely but slowly Standing Unsupported, Alternately Place Feet on Step/Stool: Able to stand independently and safely and complete 8 steps in 20 seconds Standing Unsupported, One Foot in Front: Able to plae foot ahead of the other independently and hold 30 seconds Standing on One Leg: Able to lift leg independently and hold equal to or more than 3 seconds Total Score: 47 Static Sitting Balance Static Sitting - Level of Assistance: 6: Modified independent (Device/Increase time) Dynamic Sitting Balance Dynamic Sitting - Level of Assistance: 6: Modified independent (Device/Increase time) Static Standing Balance Static Standing - Level of Assistance: 5: Stand by  assistance Dynamic Standing Balance Dynamic Standing - Level of Assistance: 5: Stand by assistance Extremity/Trunk Assessment RUE Assessment RUE Assessment: Within Functional Limits LUE Assessment LUE Assessment: Exceptions to [facility] (Rotator cuff precautions, surgery  05/2017)   See Function Navigator for Current Functional Status.  [name] [facility] 08/21/2017, 2:44 PM

## 2017-08-21 NOTE — Discharge Summary (Signed)
Discharge summary job [phone]

## 2017-08-21 NOTE — Progress Notes (Signed)
[place] PHYSICAL MEDICINE & REHABILITATION     PROGRESS NOTE  Subjective/Complaints: Up in chair. Feels well. Excited to be going home  ROS: pt denies nausea, vomiting, diarrhea, cough, shortness of breath or chest pain   Vital Signs: Blood pressure (!) 104/49, pulse 89, temperature 98.4 F (36.9 C), temperature source Oral, resp. rate 18, height 5\' 9"  (1.753 m), weight 102.3 kg (225 lb 8.8 oz), SpO2 99 %. No results found.  Recent Labs  08/21/17 0544  WBC 8.8  HGB 9.3*  HCT 30.0*  PLT 295    Recent Labs  08/21/17 0544  NA 135  K 5.4*  CL 110  GLUCOSE 114*  BUN 24*  CREATININE 1.42*  CALCIUM 8.9   CBG (last 3)   Recent Labs  08/20/17 2119 08/21/17 0621 08/21/17 1210  GLUCAP 135* 110* 114*    Wt Readings from Last 3 Encounters:  08/21/17 102.3 kg (225 lb 8.8 oz)  08/07/17 101.9 kg (224 lb 9.6 oz)  05/18/17 114.3 kg (252 lb)    Physical Exam:   Constitutional: He appears well-developed. No distress Obese  HENT: Normocephalic, atraumatic  Eyes: EOMI. No discharge. Cardiovascular: RRR without murmur. No JVD   Respiratory: CTA Bilaterally without wheezes or rales. Normal effort  GI: BS+, ND.  Musculoskeletal: He exhibits no peripheral edema. He exhibits no tenderness.  Neurological: He is alert, oriented to place, person/time HOH.  Motor: RUE:  4-/5 right deltoid, bicep, tricep, hand grip Left upper extremity: 4-/5 strength in the deltoid, bicep, tricep, hand grip (stable) Left lower extremity:  4-/5 at the hip flexors, knee extensors, ankle dorsiflexors.  Skin: Skin is warm and dry.  Psychiatric: pleasant and cooperative. More interactive  Assessment/Plan: 1. Functional deficits secondary to debility/prior CVA which require 3+ hours per day of interdisciplinary therapy in a comprehensive inpatient rehab setting. Physiatrist is providing close team supervision and 24 hour management of active medical problems listed below. Physiatrist and rehab team  continue to assess barriers to discharge/monitor patient progress toward functional and medical goals.  Function:  Bathing Bathing position Bathing activity did not occur: Refused Position: Production manager parts bathed by patient: Right arm, Left arm, Chest, Abdomen, Front perineal area, Buttocks, Right upper leg, Left upper leg, Right lower leg, Left lower leg Body parts bathed by helper: Right lower leg, Left lower leg, Back  Bathing assist Assist Level: Supervision or verbal cues      Upper Body Dressing/Undressing Upper body dressing   What is the patient wearing?: Pull over shirt/dress     Pull over shirt/dress - Perfomed by patient: Thread/unthread right sleeve, Thread/unthread left sleeve, Put head through opening, Pull shirt over trunk Pull over shirt/dress - Perfomed by helper: Pull shirt over trunk        Upper body assist Assist Level: No help, No cues      Lower Body Dressing/Undressing Lower body dressing   What is the patient wearing?: Underwear, Pants, Non-skid slipper socks, Shoes, [employer]  - Performed by patient: Thread/unthread right underwear leg, Thread/unthread left underwear leg, Pull underwear up/down Underwear - Performed by helper: Thread/unthread left underwear leg Pants- Performed by patient: Thread/unthread right pants leg, Thread/unthread left pants leg, Pull pants up/down Pants- Performed by helper: Thread/unthread right pants leg, Thread/unthread left pants leg, Pull pants up/down Non-skid slipper socks- Performed by patient: Don/doff right sock, Don/doff left sock Non-skid slipper socks- Performed by helper: Don/doff right sock, Don/doff left sock     Shoes - Performed  by patient: Don/doff right shoe, Don/doff left shoe, Fasten right, Fasten left         TED Hose - Performed by helper: Don/doff right TED hose, Don/doff left TED hose  Lower body assist Assist for lower body dressing: Supervision or verbal cues       Toileting Toileting   Toileting steps completed by patient: Adjust clothing prior to toileting, Performs perineal hygiene, Adjust clothing after toileting Toileting steps completed by helper: Adjust clothing after toileting Toileting Assistive Devices: Grab bar or rail  Toileting assist Assist level: Supervision or verbal cues   Transfers Chair/bed transfer   Chair/bed transfer method: Ambulatory Chair/bed transfer assist level: Supervision or verbal cues Chair/bed transfer assistive device: Medical sales representative     Max distance: 200 ft Assist level: Supervision or verbal cues   Wheelchair          Cognition Comprehension Comprehension assist level: Understands basic 75 - 89% of the time/ requires cueing 10 - 24% of the time  Expression Expression assist level: Expresses basic 90% of the time/requires cueing < 10% of the time.  Social Interaction Social Interaction assist level: Interacts appropriately 75 - 89% of the time - Needs redirection for appropriate language or to initiate interaction.  Problem Solving Problem solving assist level: Solves basic 90% of the time/requires cueing < 10% of the time  Memory Memory assist level: Recognizes or recalls 75 - 89% of the time/requires cueing 10 - 24% of the time    Medical Problem List and Plan: 1.  Debilitation and right upper extremity weakness due to history of CVA/acute hypoxemic respiratory failure. Patient was extubated 07/25/2017  -continue CIR  -nearing functional goals.  2.  DVT Prophylaxis/Anticoagulation: Eliquis. Monitor for any increased bleeding episodes 3. Pain Management/chronic back pain: Tylenol as needed. Patient on oxycodone 5-325mg  1 to 2 tabs every 4 hours as needed prior to admission. Resume as needed  -limiting neuro-sedating meds  -  kpad for low back pain 4. Mood : Provide emotional support. Patient on Wellbutrin 150 mg twice a day as well as Celexa 20 mg daily prior to admission.    -resumed celexa 10mg  QHS  -continue scheduled trazodone  -confusion has largely resolved 5. Neuropsych: This patient is not capable of making decisions on his own behalf. 6. Skin/Wound Care: Routine skin checks 7. Fluids/Electrolytes/Nutrition: encourage fluids  -continue to encourage fluids  -dc potassium due to hyperkalemia 8. Acute on chronic diastolic congestive heart failure.   -maintain lasix at  40mg  daily.   -weights stable  -appears euvolemic for the most part  -some of increased weight is due to improved po intake  -outputs are not being recorded consistently  Filed Weights   08/19/17 0331 08/20/17 0320 08/21/17 0500  Weight: 104.3 kg (230 lb) 103 kg (227 lb 1.2 oz) 102.3 kg (225 lb 8.8 oz)   - Follow-up per cardiology services as outpt 9. Atrial fibrillation. Continue Eliquis. Amiodarone 200 mg twice a day 10. Dysphagia. Dysphagia #3 thin liquids   -protein supp   -intake better.  11. History of CVA. Follow-up per neurology services and advised to continue eliquis. 12. Recent left rotator cuff surgery. Per Dr. Tamera Punt nonweightbearing left upper extremity except to assist with standing and able to use rolling walker. No pulling or pushing with left upper extremity. Unrestricted passive range of motion active range of motion. No strengthening restrictions 13. Acute on chronic anemia. Latest transfusion 08/07/2017.    -hgb stable at 9.3  today. No signs of blood loss  14. COPD/tobacco abuse.  15. CKD  Cr 1.42   LOS (Days) 14 A FACE TO FACE EVALUATION WAS PERFORMED  Meredith Staggers, MD 08/21/2017 2:58 PM

## 2017-08-21 NOTE — Progress Notes (Signed)
Occupational Therapy Note  Patient Details  Name: Brandon [name] MRN: [id number] Date of Birth: 1946-06-25  Today's Date: 08/21/2017 OT Individual Time: 1400-1425 OT Individual Time Calculation (min): 25 min   Pt denies pain Individual Therapy  Pt resting in w/c upon arrival with wife present.  Pt amb with RW from room to ADL tub room and practiced tub bench transfers X 2 at supervision level.  Discussed home setup and recommendations.  Pt returned to room and remained in w/c with wife present.  Reviewed home safety recommendations.  Wife and pt verbalized understanding of recommendations. Pt and wife pleased with progress and ready for discharge tomorrow.    [name] [facility] 08/21/2017, 2:30 PM

## 2017-08-21 NOTE — Progress Notes (Signed)
Physical Therapy Discharge Summary  Patient Details  Name: ALCUS BRADLY MRN: [id number] Date of Birth: 1946/11/29  Today's Date: 08/21/2017 PT Individual Time: 1300-1400 PT Individual Time Calculation (min): 60 min    Patient has met 9 of 9 long term goals due to improved activity tolerance, improved balance, improved postural control, increased strength, improved attention, improved awareness and improved coordination.  Patient to discharge at an ambulatory level Supervision.   Patient's care partner is independent to provide the necessary physical and cognitive assistance at discharge.  Reasons goals not met: n/a  Recommendation:  Patient will benefit from ongoing skilled PT services in outpatient setting to continue to advance safe functional mobility, address ongoing impairments in gait, strength, balance, safety awareness, and minimize fall risk.  Equipment: rolling walker   Reasons for discharge: treatment goals met  Patient/family agrees with progress made and goals achieved: Yes  PT Discharge Precautions/Restrictions Restrictions Weight Bearing Restrictions: Yes LUE Weight Bearing: Non weight bearing Pain Pain Assessment Pain Assessment: No/denies pain Vision/Perception  Vision - Assessment Additional Comments: no visual deficits effecting mobility  Perception Perception: Within Functional Limits Praxis Praxis: Intact  Cognition Overall Cognitive Status: Within Functional Limits for tasks assessed Arousal/Alertness: Awake/alert Orientation Level: Oriented X4 Attention: Selective Sustained Attention: Appears intact Selective Attention: Appears intact Memory: Appears intact Awareness: Appears intact Problem Solving: Appears intact Safety/Judgment: Impaired Sensation Sensation Light Touch: Appears Intact Stereognosis: Appears Intact Hot/Cold: Appears Intact Proprioception: Appears Intact Coordination Gross Motor Movements are Fluid and Coordinated:  Yes Fine Motor Movements are Fluid and Coordinated: Yes Heel Shin Test: Quick movements, reduced ROM Motor  Motor Motor: Within Functional Limits  Mobility Bed Mobility Rolling Right: 6: Modified independent (Device/Increase time) Rolling Right Details: Other (comment) (no cues) Rolling Left: 6: Modified independent (Device/Increase time) Rolling Left Details: Other (comment) (no cues) Supine to Sit: 5: Supervision Supine to Sit Details: Verbal cues for precautions/safety Sit to Supine: 5: Supervision Sit to Supine - Details: Verbal cues for precautions/safety Transfers Transfers: Yes Sit to Stand: 5: Supervision Sit to Stand Details: Verbal cues for precautions/safety Stand to Sit: 5: Supervision Stand to Sit Details (indicate cue type and reason): Verbal cues for precautions/safety Stand Pivot Transfers: 5: Supervision Stand Pivot Transfer Details: Verbal cues for precautions/safety Locomotion  Ambulation Ambulation: Yes Ambulation/Gait Assistance: 5: Supervision Ambulation Distance (Feet): 200 Feet Assistive device: Rolling walker Ambulation/Gait Assistance Details: Verbal cues for precautions/safety Gait Gait: Yes Gait Pattern: Impaired Gait Pattern:  (bilat hip drop; hyperextension of rt knee) Gait velocity: 2.0 ft/s Stairs / Additional Locomotion Stairs: Yes Stairs Assistance: 5: Supervision Stairs Assistance Details: Verbal cues for precautions/safety Stair Management Technique: Two rails Number of Stairs: 8 Height of Stairs: 6 Ramp: 5: Supervision Curb: 5: Supervision Wheelchair Mobility Wheelchair Mobility: No  Trunk/Postural Assessment  Cervical Assessment Cervical Assessment: Within Functional Limits Thoracic Assessment Thoracic Assessment:  (mild kyphosis) Lumbar Assessment Lumbar Assessment:  (posterior pelvic tilt) Postural Control Postural Control: Within Functional Limits  Balance Standardized Balance Assessment Standardized Balance  Assessment: Berg Balance Test Berg Balance Test Sit to Stand: Able to stand  independently using hands Standing Unsupported: Able to stand safely 2 minutes Sitting with Back Unsupported but Feet Supported on Floor or Stool: Able to sit safely and securely 2 minutes Stand to Sit: Sits safely with minimal use of hands Transfers: Able to transfer safely, definite need of hands Standing Unsupported with Eyes Closed: Able to stand 10 seconds safely Standing Ubsupported with Feet Together: Able to place feet together independently and  stand 1 minute safely From Standing, Reach Forward with Outstretched Arm: Can reach forward >12 cm safely (5") From Standing Position, Pick up Object from Floor: Able to pick up shoe, needs supervision From Standing Position, Turn to Look Behind Over each Shoulder: Looks behind from both sides and weight shifts well Turn 360 Degrees: Able to turn 360 degrees safely but slowly Standing Unsupported, Alternately Place Feet on Step/Stool: Able to stand independently and safely and complete 8 steps in 20 seconds Standing Unsupported, One Foot in Front: Able to plae foot ahead of the other independently and hold 30 seconds Standing on One Leg: Able to lift leg independently and hold equal to or more than 3 seconds Total Score: 47 Static Sitting Balance Static Sitting - Level of Assistance: 6: Modified independent (Device/Increase time) Dynamic Sitting Balance Dynamic Sitting - Level of Assistance: 6: Modified independent (Device/Increase time) Static Standing Balance Static Standing - Level of Assistance: 5: Stand by assistance Dynamic Standing Balance Dynamic Standing - Level of Assistance: 5: Stand by assistance Extremity Assessment  RUE Assessment RUE Assessment: Within Functional Limits LUE Assessment LUE Assessment: Exceptions to [facility] (Rotator cuff precautions, surgery 05/2017) RLE Assessment RLE Assessment: Exceptions to [facility] RLE Strength RLE Overall Strength  Comments: hip flexion 4-/5, DF 4-/5 LLE Assessment LLE Assessment: Exceptions to [facility] LLE Strength LLE Overall Strength Comments: hip flexion 4-/5, DF 4-/5   Skilled Therapy Intervention Pt received sitting in wheelchair, agreeable to therapy. Wife present for therapy session. Reviewed supervision required, proper guarding, energy conservation, and safety awareness throughout session.  Assess all mobility as above, supervision overall. Pt wife demonstrated ability to provide proper support. Pt ambulated 150 ft back to room with no AD and min assist. Pt left sitting in wheelchair, wife present.   See Function Navigator for Current Functional Status.  Gwinda Passe, SPT 08/21/2017, 3:46 PM

## 2017-08-22 DIAGNOSIS — Z23 Encounter for immunization: Secondary | ICD-10-CM | POA: Diagnosis not present

## 2017-08-22 LAB — GLUCOSE, CAPILLARY: GLUCOSE-CAPILLARY: 108 mg/dL — AB (ref 65–99)

## 2017-08-22 LAB — BASIC METABOLIC PANEL
Anion gap: 7 (ref 5–15)
BUN: 20 mg/dL (ref 6–20)
CO2: 20 mmol/L — ABNORMAL LOW (ref 22–32)
CREATININE: 1.42 mg/dL — AB (ref 0.61–1.24)
Calcium: 8.8 mg/dL — ABNORMAL LOW (ref 8.9–10.3)
Chloride: 108 mmol/L (ref 101–111)
GFR calc Af Amer: 56 mL/min — ABNORMAL LOW (ref >60)
GFR, EST NON AFRICAN AMERICAN: 48 mL/min — AB (ref >60)
GLUCOSE: 97 mg/dL (ref 65–99)
Potassium: 4.5 mmol/L (ref 3.5–5.1)
SODIUM: 135 mmol/L (ref 135–145)

## 2017-08-22 MED ORDER — FERROUS SULFATE 325 (65 FE) MG PO TABS: 325.0000 mg | ORAL_TABLET | Freq: Every day | ORAL | 3 refills | Status: DC

## 2017-08-22 MED ORDER — LORAZEPAM 0.5 MG PO TABS: 0.5000 mg | ORAL_TABLET | Freq: Four times a day (QID) | ORAL | 0 refills | Status: DC | PRN

## 2017-08-22 MED ORDER — FUROSEMIDE 40 MG PO TABS: 40.0000 mg | ORAL_TABLET | Freq: Every day | ORAL | 12 refills | Status: DC

## 2017-08-22 MED ORDER — POTASSIUM CHLORIDE CRYS ER 20 MEQ PO TBCR: 20.0000 meq | EXTENDED_RELEASE_TABLET | Freq: Every day | ORAL | 0 refills | Status: DC

## 2017-08-22 MED ORDER — POTASSIUM CHLORIDE ER 10 MEQ PO TBCR: 20.0000 meq | EXTENDED_RELEASE_TABLET | Freq: Every day | ORAL | 0 refills | Status: DC

## 2017-08-22 MED ORDER — CITALOPRAM HYDROBROMIDE 10 MG PO TABS: 10.0000 mg | ORAL_TABLET | Freq: Every day | ORAL | 1 refills | Status: DC

## 2017-08-22 MED ORDER — TRAZODONE HCL 50 MG PO TABS: 50.0000 mg | ORAL_TABLET | Freq: Every day | ORAL | 0 refills | Status: DC

## 2017-08-22 MED ORDER — OXYCODONE-ACETAMINOPHEN 5-325 MG PO TABS: 1.0000 | ORAL_TABLET | ORAL | 0 refills | Status: DC | PRN

## 2017-08-22 MED ORDER — NITROGLYCERIN 0.4 MG SL SUBL: 0.4000 mg | SUBLINGUAL_TABLET | SUBLINGUAL | 12 refills | Status: DC | PRN

## 2017-08-22 MED ORDER — POTASSIUM CHLORIDE CRYS ER 20 MEQ PO TBCR: 20.0000 meq | EXTENDED_RELEASE_TABLET | Freq: Every day | ORAL | Status: DC

## 2017-08-22 MED ORDER — AMIODARONE HCL 200 MG PO TABS: 200.0000 mg | ORAL_TABLET | Freq: Two times a day (BID) | ORAL | 12 refills | Status: DC

## 2017-08-22 MED ORDER — PANTOPRAZOLE SODIUM 20 MG PO TBEC: 20.0000 mg | DELAYED_RELEASE_TABLET | Freq: Every day | ORAL | 0 refills | Status: DC

## 2017-08-22 MED ORDER — APIXABAN 5 MG PO TABS: 5.0000 mg | ORAL_TABLET | Freq: Two times a day (BID) | ORAL | 12 refills | Status: DC

## 2017-08-22 MED FILL — LORazepam 0.5 MG TABS: 0.5 | 10 days supply | Qty: 20 | Fill #0

## 2017-08-22 MED FILL — PANTOPRAZOLE SOD DR 20 MG T: 20 | 30 days supply | Qty: 30 | Fill #0

## 2017-08-22 MED FILL — OXYCODONE W/APAP 5/325 TAB: 5-325 | 3 days supply | Qty: 30 | Fill #0

## 2017-08-22 MED FILL — traZODone HCL 50 MG TABS: 50 | 30 days supply | Qty: 30 | Fill #0

## 2017-08-22 MED FILL — NITROGLYCERIN 0.4 MG TAB SL: 0.4 | 10 days supply | Qty: 25 | Fill #0

## 2017-08-22 MED FILL — FUROSEMIDE 40 MG TABLET: 40 | 30 days supply | Qty: 30 | Fill #0

## 2017-08-22 MED FILL — POTASSIUM CL ER 20 MEQ TABL: 20 | 30 days supply | Qty: 30 | Fill #0

## 2017-08-22 MED FILL — AMIODARONE HCL 200 MG TAB: 200 | 30 days supply | Qty: 60 | Fill #0

## 2017-08-22 MED FILL — ELIQUIS 5 MG TABLET: 5 | 30 days supply | Qty: 60 | Fill #0

## 2017-08-22 MED FILL — CITALOPRAM HBR 10 MG TABLET: 10 | 30 days supply | Qty: 30 | Fill #0

## 2017-08-22 NOTE — Progress Notes (Signed)
Social Work Discharge Note  The overall goal for the admission was met for:   Discharge location: Yes - home  Length of Stay: Yes - 15 days  Discharge activity level: Yes - supervision   Home/community participation: Yes  Services provided included: MD, RD, PT, OT, SLP, RN, Pharmacy and [place]: Private Insurance: [employer] and [employer]  Follow-up services arranged: Home Health: RN/PT/OT/ST, DME: rolling walker; bedside commode; tub transfer bench and Patient/Family request agency HH: [place], DME: [facility]  Comments (or additional information): Pt to return to his home with his wife to provide 24/7 supervision.  Wife may buy transport chair for community, as pt does not need wheelchair due to walking 300'.  Signed handicapped placard application given to wife to take to plate agency.    Patient/Family verbalized understanding of follow-up arrangements: Yes  Individual responsible for coordination of the follow-up plan: Pt's wife  Confirmed correct DME delivered: Trey Sailors 08/22/2017    Brandon [name], Silvestre Mesi

## 2017-08-22 NOTE — Progress Notes (Signed)
Brandon [name] PHYSICAL MEDICINE & REHABILITATION     PROGRESS NOTE  Subjective/Complaints: No new complaints. Anxious to go home  ROS: pt denies nausea, vomiting, diarrhea, cough, shortness of breath or chest pain   Vital Signs: Blood pressure (!) 128/58, pulse 79, temperature 98.7 F (37.1 C), temperature source Oral, resp. rate 20, height 5\' 9"  (1.753 m), weight 102.1 kg (225 lb), SpO2 94 %. No results found.  Recent Labs  08/21/17 0544  WBC 8.8  HGB 9.3*  HCT 30.0*  PLT 295    Recent Labs  08/21/17 0544 08/22/17 0737  NA 135 135  K 5.4* 4.5  CL 110 108  GLUCOSE 114* 97  BUN 24* 20  CREATININE 1.42* 1.42*  CALCIUM 8.9 8.8*   CBG (last 3)   Recent Labs  08/21/17 1656 08/21/17 2132 08/22/17 0610  GLUCAP 79 128* 108*    Wt Readings from Last 3 Encounters:  08/22/17 102.1 kg (225 lb)  08/07/17 101.9 kg (224 lb 9.6 oz)  05/18/17 114.3 kg (252 lb)    Physical Exam:   Constitutional: Brandon [name] appears well-developed. No distress Obese  HENT: Normocephalic, atraumatic  Eyes: EOMI. No discharge. Cardiovascular: RRR without murmur. No JVD    Respiratory: CTA Bilaterally without wheezes or rales. Normal effort   GI: BS+, ND.  Musculoskeletal: Brandon [name] exhibits no peripheral edema. Brandon [name] exhibits no tenderness.  Neurological: Brandon [name] is alert, oriented to place, person/time HOH.  Motor: RUE:  4-/5 right deltoid, bicep, tricep, hand grip Left upper extremity: 4-/5 strength in the deltoid, bicep, tricep, hand grip (stable) Left lower extremity:  4-/5 at the hip flexors, knee extensors, ankle dorsiflexors.  Skin: Skin is warm and dry.  Psychiatric: pleasant and cooperative. More interactive  Assessment/Plan: 1. Functional deficits secondary to debility/prior CVA which require 3+ hours per day of interdisciplinary therapy in a comprehensive inpatient rehab setting. Physiatrist is providing close team supervision and 24 hour management of active medical problems listed  below. Physiatrist and rehab team continue to assess barriers to discharge/monitor patient progress toward functional and medical goals.  Function:  Bathing Bathing position Bathing activity did not occur: Refused Position: Production manager parts bathed by patient: Right arm, Left arm, Chest, Abdomen, Front perineal area, Buttocks, Right upper leg, Left upper leg, Right lower leg, Left lower leg Body parts bathed by helper: Right lower leg, Left lower leg, Back  Bathing assist Assist Level: Supervision or verbal cues      Upper Body Dressing/Undressing Upper body dressing   What is the patient wearing?: Pull over shirt/dress     Pull over shirt/dress - Perfomed by patient: Thread/unthread right sleeve, Thread/unthread left sleeve, Put head through opening, Pull shirt over trunk Pull over shirt/dress - Perfomed by helper: Pull shirt over trunk        Upper body assist Assist Level: No help, No cues      Lower Body Dressing/Undressing Lower body dressing   What is the patient wearing?: Underwear, Pants, Non-skid slipper socks, Shoes, [employer]  - Performed by patient: Thread/unthread right underwear leg, Thread/unthread left underwear leg, Pull underwear up/down Underwear - Performed by helper: Thread/unthread left underwear leg Pants- Performed by patient: Thread/unthread right pants leg, Thread/unthread left pants leg, Pull pants up/down Pants- Performed by helper: Thread/unthread right pants leg, Thread/unthread left pants leg, Pull pants up/down Non-skid slipper socks- Performed by patient: Don/doff right sock, Don/doff left sock Non-skid slipper socks- Performed by helper: Don/doff right sock, Don/doff left sock  Shoes - Performed by patient: Don/doff right shoe, Don/doff left shoe, Fasten right, Fasten left         TED Hose - Performed by helper: Don/doff right TED hose, Don/doff left TED hose  Lower body assist Assist for lower body dressing:  Supervision or verbal cues      Toileting Toileting   Toileting steps completed by patient: Adjust clothing prior to toileting, Performs perineal hygiene, Adjust clothing after toileting Toileting steps completed by helper: Adjust clothing after toileting Toileting Assistive Devices: Grab bar or rail  Toileting assist Assist level: Supervision or verbal cues   Transfers Chair/bed transfer   Chair/bed transfer method: Ambulatory Chair/bed transfer assist level: Supervision or verbal cues Chair/bed transfer assistive device: Medical sales representative     Max distance: 200 ft Assist level: Supervision or verbal cues   Wheelchair          Cognition Comprehension Comprehension assist level: Understands complex 90% of the time/cues 10% of the time  Expression Expression assist level: Expresses basic 90% of the time/requires cueing < 10% of the time.  Social Interaction Social Interaction assist level: Interacts appropriately 90% of the time - Needs monitoring or encouragement for participation or interaction.  Problem Solving Problem solving assist level: Solves basic 90% of the time/requires cueing < 10% of the time  Memory Memory assist level: Recognizes or recalls 90% of the time/requires cueing < 10% of the time    Medical Problem List and Plan: 1.  Debilitation and right upper extremity weakness due to history of CVA/acute hypoxemic respiratory failure. Patient was extubated 07/25/2017  -home today  -Patient to see Rehab MD in the office for transitional care encounter in 1-2 weeks.  2.  DVT Prophylaxis/Anticoagulation: Eliquis. Monitor for any increased bleeding episodes 3. Pain Management/chronic back pain: Tylenol as needed. Patient on oxycodone 5-325mg  1 to 2 tabs every 4 hours as needed prior to admission. Resume as needed  -limiting neuro-sedating meds  -  kpad for low back pain 4. Mood : Provide emotional support. Patient on Wellbutrin 150 mg twice a day as  well as Celexa 20 mg daily prior to admission.   -resumed celexa 10mg  QHS  -continue scheduled trazodone  -confusion has largely resolved 5. Neuropsych: This patient is not capable of making decisions on his own behalf. 6. Skin/Wound Care: Routine skin checks 7. Fluids/Electrolytes/Nutrition: encourage fluids  -continue to encourage fluids  -resume kdur 44meq daily at home  -would follow up labs within the next week 8. Acute on chronic diastolic congestive heart failure.   -maintain lasix at  40mg  daily.   -weights stable  -appears euvolemic for the most part  -some of increased weight is due to improved po intake  -outputs are not being recorded consistently  Filed Weights   08/20/17 0320 08/21/17 0500 08/22/17 0500  Weight: 103 kg (227 lb 1.2 oz) 102.3 kg (225 lb 8.8 oz) 102.1 kg (225 lb)   - Follow-up per cardiology services as outpt 9. Atrial fibrillation. Continue Eliquis. Amiodarone 200 mg twice a day 10. Dysphagia. Dysphagia #3 thin liquids   -protein supp   -intake better.  11. History of CVA. Follow-up per neurology services and advised to continue eliquis. 12. Recent left rotator cuff surgery. Per Dr. Tamera Punt nonweightbearing left upper extremity except to assist with standing and able to use rolling walker. No pulling or pushing with left upper extremity. Unrestricted passive range of motion active range of motion. No strengthening restrictions 13. Acute  on chronic anemia. Latest transfusion 08/07/2017.    -hgb stable at 9.3 today. No signs of blood loss  14. COPD/tobacco abuse.  15. CKD  Cr 1.45   LOS (Days) 15 A FACE TO FACE EVALUATION WAS PERFORMED  Meredith Staggers, MD 08/22/2017 8:58 AM

## 2017-08-22 NOTE — Progress Notes (Signed)
Pt discharged to home with wife. Discharge instructions given to pt and wife by Marlowe Shores, PA with verbal understanding.

## 2017-08-23 DIAGNOSIS — Z4789 Encounter for other orthopedic aftercare: Secondary | ICD-10-CM | POA: Diagnosis not present

## 2017-08-23 DIAGNOSIS — R1310 Dysphagia, unspecified: Secondary | ICD-10-CM | POA: Diagnosis not present

## 2017-08-23 DIAGNOSIS — N183 Chronic kidney disease, stage 3 (moderate): Secondary | ICD-10-CM | POA: Diagnosis not present

## 2017-08-23 DIAGNOSIS — I69331 Monoplegia of upper limb following cerebral infarction affecting right dominant side: Secondary | ICD-10-CM | POA: Diagnosis not present

## 2017-08-23 DIAGNOSIS — D631 Anemia in chronic kidney disease: Secondary | ICD-10-CM | POA: Diagnosis not present

## 2017-08-23 DIAGNOSIS — I4891 Unspecified atrial fibrillation: Secondary | ICD-10-CM | POA: Diagnosis not present

## 2017-08-23 DIAGNOSIS — I5033 Acute on chronic diastolic (congestive) heart failure: Secondary | ICD-10-CM | POA: Diagnosis not present

## 2017-08-23 DIAGNOSIS — J449 Chronic obstructive pulmonary disease, unspecified: Secondary | ICD-10-CM | POA: Diagnosis not present

## 2017-08-23 DIAGNOSIS — I130 Hypertensive heart and chronic kidney disease with heart failure and stage 1 through stage 4 chronic kidney disease, or unspecified chronic kidney disease: Secondary | ICD-10-CM | POA: Diagnosis not present

## 2017-08-24 ENCOUNTER — Encounter (HOSPITAL_COMMUNITY): Payer: Self-pay

## 2017-08-24 ENCOUNTER — Ambulatory Visit (HOSPITAL_COMMUNITY)
Admission: RE | Admit: 2017-08-24 | Discharge: 2017-08-24 | Disposition: A | Discharge status: Home / Self Care | Payer: Medicare HMO | Source: Ambulatory Visit | Attending: Internal Medicine | Admitting: Internal Medicine

## 2017-08-24 ENCOUNTER — Other Ambulatory Visit (HOSPITAL_COMMUNITY): Payer: Self-pay | Admitting: *Deleted

## 2017-08-24 VITALS — BP 148/68 | HR 80 | Wt 231.8 lb

## 2017-08-24 DIAGNOSIS — I359 Nonrheumatic aortic valve disorder, unspecified: Secondary | ICD-10-CM | POA: Diagnosis not present

## 2017-08-24 DIAGNOSIS — E782 Mixed hyperlipidemia: Secondary | ICD-10-CM

## 2017-08-24 DIAGNOSIS — I38 Endocarditis, valve unspecified: Secondary | ICD-10-CM | POA: Insufficient documentation

## 2017-08-24 DIAGNOSIS — I5032 Chronic diastolic (congestive) heart failure: Secondary | ICD-10-CM

## 2017-08-24 DIAGNOSIS — I059 Rheumatic mitral valve disease, unspecified: Secondary | ICD-10-CM

## 2017-08-24 DIAGNOSIS — K219 Gastro-esophageal reflux disease without esophagitis: Secondary | ICD-10-CM | POA: Diagnosis not present

## 2017-08-24 DIAGNOSIS — Z8673 Personal history of transient ischemic attack (TIA), and cerebral infarction without residual deficits: Secondary | ICD-10-CM | POA: Insufficient documentation

## 2017-08-24 DIAGNOSIS — Z87891 Personal history of nicotine dependence: Secondary | ICD-10-CM | POA: Diagnosis not present

## 2017-08-24 DIAGNOSIS — N183 Chronic kidney disease, stage 3 (moderate): Secondary | ICD-10-CM | POA: Insufficient documentation

## 2017-08-24 DIAGNOSIS — E785 Hyperlipidemia, unspecified: Secondary | ICD-10-CM | POA: Diagnosis not present

## 2017-08-24 DIAGNOSIS — J449 Chronic obstructive pulmonary disease, unspecified: Secondary | ICD-10-CM

## 2017-08-24 DIAGNOSIS — I480 Paroxysmal atrial fibrillation: Secondary | ICD-10-CM | POA: Insufficient documentation

## 2017-08-24 DIAGNOSIS — M109 Gout, unspecified: Secondary | ICD-10-CM | POA: Insufficient documentation

## 2017-08-24 DIAGNOSIS — Z7901 Long term (current) use of anticoagulants: Secondary | ICD-10-CM | POA: Insufficient documentation

## 2017-08-24 DIAGNOSIS — Z79899 Other long term (current) drug therapy: Secondary | ICD-10-CM | POA: Insufficient documentation

## 2017-08-24 DIAGNOSIS — I130 Hypertensive heart and chronic kidney disease with heart failure and stage 1 through stage 4 chronic kidney disease, or unspecified chronic kidney disease: Secondary | ICD-10-CM | POA: Diagnosis not present

## 2017-08-24 DIAGNOSIS — H9190 Unspecified hearing loss, unspecified ear: Secondary | ICD-10-CM | POA: Diagnosis not present

## 2017-08-24 DIAGNOSIS — D509 Iron deficiency anemia, unspecified: Secondary | ICD-10-CM | POA: Insufficient documentation

## 2017-08-24 LAB — CBC
HEMATOCRIT: 30.8 % — AB (ref 39.0–52.0)
HEMOGLOBIN: 9.6 g/dL — AB (ref 13.0–17.0)
MCH: 30.3 pg (ref 26.0–34.0)
MCHC: 31.2 g/dL (ref 30.0–36.0)
MCV: 97.2 fL (ref 78.0–100.0)
Platelets: 287 10*3/uL (ref 150–400)
RBC: 3.17 MIL/uL — ABNORMAL LOW (ref 4.22–5.81)
RDW: 17.1 % — ABNORMAL HIGH (ref 11.5–15.5)
WBC: 9.1 10*3/uL (ref 4.0–10.5)

## 2017-08-24 LAB — COMPREHENSIVE METABOLIC PANEL
ALBUMIN: 3 g/dL — AB (ref 3.5–5.0)
ALT: 27 U/L (ref 17–63)
AST: 19 U/L (ref 15–41)
Alkaline Phosphatase: 34 U/L — ABNORMAL LOW (ref 38–126)
Anion gap: 8 (ref 5–15)
BUN: 18 mg/dL (ref 6–20)
CHLORIDE: 111 mmol/L (ref 101–111)
CO2: 19 mmol/L — AB (ref 22–32)
CREATININE: 1.37 mg/dL — AB (ref 0.61–1.24)
Calcium: 8.6 mg/dL — ABNORMAL LOW (ref 8.9–10.3)
GFR calc Af Amer: 58 mL/min — ABNORMAL LOW (ref >60)
GFR calc non Af Amer: 50 mL/min — ABNORMAL LOW (ref >60)
Glucose, Bld: 122 mg/dL — ABNORMAL HIGH (ref 65–99)
Potassium: 4 mmol/L (ref 3.5–5.1)
SODIUM: 138 mmol/L (ref 135–145)
Total Bilirubin: 0.3 mg/dL (ref 0.3–1.2)
Total Protein: 6.3 g/dL — ABNORMAL LOW (ref 6.5–8.1)

## 2017-08-24 LAB — LIPID PANEL
Cholesterol: 153 mg/dL (ref 0–200)
HDL: 36 mg/dL — AB (ref >40)
LDL CALC: 90 mg/dL (ref 0–99)
TRIGLYCERIDES: 133 mg/dL (ref <150)
Total CHOL/HDL Ratio: 4.3 RATIO
VLDL: 27 mg/dL (ref 0–40)

## 2017-08-24 LAB — TSH: TSH: 3.29 u[IU]/mL (ref 0.350–4.500)

## 2017-08-24 LAB — BRAIN NATRIURETIC PEPTIDE: B NATRIURETIC PEPTIDE 5: 253.3 pg/mL — AB (ref 0.0–100.0)

## 2017-08-24 MED ORDER — AMIODARONE HCL 200 MG PO TABS: 200.0000 mg | ORAL_TABLET | Freq: Every day | ORAL | 12 refills | Status: DC

## 2017-08-24 MED ORDER — LOVASTATIN 20 MG PO TABS: 20.0000 mg | ORAL_TABLET | Freq: Every day | ORAL | 3 refills | Status: DC

## 2017-08-24 NOTE — Patient Instructions (Signed)
DECREASE AMiodarone to 200 mg tablet once daily.  RESTART Lovastatin 20 mg tablet once daily.  Will schedule you for Pulmonary Function Test with [facility] on 2nd floor west wing. Arrive at [employer] [facility] (Entrance A on [address] parking available. Check in at front desk once you enter through sliding doors.  You have been scheduled for a transesophageal echocardiogram and cardiac cath on Wednesday September 19th. See attached instruction sheet for additional instructions.  Follow up 1 month with Dr. Aundra Dubin.  Take all medication as prescribed the day of your appointment. Bring all medications with you to your appointment.  Do the following things EVERYDAY: 1) Weigh yourself in the morning before breakfast. Write it down and keep it in a log. 2) Take your medicines as prescribed 3) Eat low salt foods-Limit salt (sodium) to 2000 mg per day.  4) Stay as active as you can everyday 5) Limit all fluids for the day to less than 2 liters

## 2017-08-25 ENCOUNTER — Telehealth (HOSPITAL_COMMUNITY): Payer: Self-pay | Admitting: *Deleted

## 2017-08-25 ENCOUNTER — Telehealth (HOSPITAL_COMMUNITY): Payer: Self-pay

## 2017-08-25 ENCOUNTER — Other Ambulatory Visit (HOSPITAL_COMMUNITY): Payer: Self-pay | Admitting: Respiratory Therapy

## 2017-08-25 DIAGNOSIS — I4891 Unspecified atrial fibrillation: Secondary | ICD-10-CM | POA: Diagnosis not present

## 2017-08-25 DIAGNOSIS — D631 Anemia in chronic kidney disease: Secondary | ICD-10-CM | POA: Diagnosis not present

## 2017-08-25 DIAGNOSIS — I5033 Acute on chronic diastolic (congestive) heart failure: Secondary | ICD-10-CM | POA: Diagnosis not present

## 2017-08-25 DIAGNOSIS — J449 Chronic obstructive pulmonary disease, unspecified: Secondary | ICD-10-CM | POA: Diagnosis not present

## 2017-08-25 DIAGNOSIS — I130 Hypertensive heart and chronic kidney disease with heart failure and stage 1 through stage 4 chronic kidney disease, or unspecified chronic kidney disease: Secondary | ICD-10-CM | POA: Diagnosis not present

## 2017-08-25 DIAGNOSIS — R1310 Dysphagia, unspecified: Secondary | ICD-10-CM | POA: Diagnosis not present

## 2017-08-25 DIAGNOSIS — I69331 Monoplegia of upper limb following cerebral infarction affecting right dominant side: Secondary | ICD-10-CM | POA: Diagnosis not present

## 2017-08-25 DIAGNOSIS — Z4789 Encounter for other orthopedic aftercare: Secondary | ICD-10-CM | POA: Diagnosis not present

## 2017-08-25 DIAGNOSIS — N183 Chronic kidney disease, stage 3 (moderate): Secondary | ICD-10-CM | POA: Diagnosis not present

## 2017-08-25 NOTE — Addendum Note (Signed)
Encounter addended by: Larey Dresser, MD on: 08/25/2017  3:07 PM<BR>    Actions taken: LOS modified

## 2017-08-25 NOTE — Telephone Encounter (Signed)
Notes recorded by Shirley Muscat, RN on 08/25/2017 at 9:10 AM EDT Pt aware of results  ------  Notes recorded by Larey Dresser, MD on 08/24/2017 at 10:14 PM EDT Labs look ok

## 2017-08-25 NOTE — Progress Notes (Signed)
PCP: Dr. Justin Mend Cardiology: Dr. Radford Pax HF Cardiology: Dr. Aundra Dubin  71 yo with history of CVA, paroxysmal atrial fibrillation, valvular heart disease, and chronic diastolic CHF presents for evaluation of CHF and AS/MR.    He had had episodes of dyspnea and initial workup led to a TEE in 5/18 showing normal EF with moderate AS and moderate MR.  He continued to have episodic dyspnea and ended up admitted in 8/18 with shortness of breath and chest pressure.  This led to a long, complicated hospitalization.  He was noted to be volume overloaded with acute diastolic CHF and was also noted to have symptomatic runs of atrial fibrillation with RVR.  Amiodarone was started to control the atrial fibrillation.  He developed respiratory distress => Bipap => intubated.  He became hypotensive, requiring pressors.  He developed AKI.  PNA was noted and he received broad spectrum abx.  He had a prolonged intubation but was eventually extubated.  Given deconditioning from long hospitalization, he went to CIR for a couple of weeks.   He is now back home again.  He is doing considerably better overall.  Still on dysphagia diet due to aspiration noted post long intubation.  Vocal cord function is much better, no longer hoarse.  He is now getting home PT.  No dyspnea walking on flat ground.  Using walker for stability as he has significant muscle weakness/deconditioning.  Easily fatigued. No tachypalpitations, he is in NSR today.  No orthopnea/PND.  No chest pain.    ECG (personally reviewed): NSR, old ASMI, QTc 472 msec  Labs (9/18): K 4.5, creatinine 1.42  PMH: 1. CVA: Right MCA, 2004.  Minimal residual problems.  2. HTN 3. Hyperlipidemia 4. Left rotator cuff surgery 6/18 5. Carotid stenosis: s/p right carotid stent.  6. GERD 7. H/o CCY 8. Anemia: FOBT negative.  9. Gout  10. Atrial fibrillation: Paroxysmal.  11. Valvular heart disease: TEE (5/18) with EF 55-60%, moderate AS mean 33 mmHg, moderate MR, normal RV  size and systolic function.  - Echo (8/18): EF 55-60%, moderate LVH, moderate AS with mean gradient 33 mmHg, moderate AI, moderate to severe MR.  12. Chronic diastolic CHF 13. Deafness 14. CKD: stage 3.  15. Suspect COPD  SH: Quit smoking 8/18.  Married, 2 children, retired.    Family History  Problem Relation Age of Onset  . Stroke Father        No details  . Angina Mother    ROS: All systems reviewed and negative except as per HPI.   Current Outpatient Prescriptions  Medication Sig Dispense Refill  . amiodarone (PACERONE) 200 MG tablet Take 1 tablet (200 mg total) by mouth daily. 60 tablet 12  . apixaban (ELIQUIS) 5 MG TABS tablet Take 1 tablet (5 mg total) by mouth 2 (two) times daily. 60 tablet 12  . citalopram (CELEXA) 10 MG tablet Take 1 tablet (10 mg total) by mouth at bedtime. 30 tablet 1  . docusate sodium (COLACE) 100 MG capsule Take 1 capsule (100 mg total) by mouth 3 (three) times daily as needed. 20 capsule 0  . ferrous sulfate 325 (65 FE) MG tablet Take 1 tablet (325 mg total) by mouth daily with breakfast. 30 tablet 3  . furosemide (LASIX) 40 MG tablet Take 1 tablet (40 mg total) by mouth daily. 30 tablet 12  . LORazepam (ATIVAN) 0.5 MG tablet Take 1 tablet (0.5 mg total) by mouth every 6 (six) hours as needed for anxiety (aggitation). 20 tablet 0  .  nitroGLYCERIN (NITROSTAT) 0.4 MG SL tablet Place 1 tablet (0.4 mg total) under the tongue every 5 (five) minutes x 3 doses as needed for chest pain. 30 tablet 12  . pantoprazole (PROTONIX) 20 MG tablet Take 1 tablet (20 mg total) by mouth daily. 30 tablet 0  . potassium chloride SA (K-DUR,KLOR-CON) 20 MEQ tablet Take 1 tablet (20 mEq total) by mouth daily. 30 tablet 0  . traZODone (DESYREL) 50 MG tablet Take 1 tablet (50 mg total) by mouth at bedtime. 30 tablet 0  . lovastatin (MEVACOR) 20 MG tablet Take 1 tablet (20 mg total) by mouth at bedtime. 30 tablet 3  . oxyCODONE-acetaminophen (ROXICET) 5-325 MG tablet Take 1-2  tablets by mouth every 4 (four) hours as needed for severe pain. (Patient not taking: Reported on 08/24/2017) 30 tablet 0   No current facility-administered medications for this encounter.    BP (!) 148/68   Pulse 80   Wt 231 lb 12.8 oz (105.1 kg)   SpO2 98%   BMI 34.23 kg/m  General: NAD Neck: No JVD, no thyromegaly or thyroid nodule.  Lungs: Clear to auscultation bilaterally with normal respiratory effort. CV: Nondisplaced PMI.  Heart regular S1/S2, no J6/E8, 2/6 systolic murmur RUSB and apex.  Trace edema.  No carotid bruit.  Normal pedal pulses.  Abdomen: Soft, nontender, no hepatosplenomegaly, no distention.  Skin: Intact without lesions or rashes.  Neurologic: Alert and oriented x 3.  Psych: Normal affect. Extremities: No clubbing or cyanosis.  HEENT: Normal.   Assessment/Plan: 1. Valvular heart disease: Last echo in 8/18 showed moderate AS, moderate AI, and moderate to severe MR.  I am concerned that his valvular disease played a role in recent diastolic CHF exacerbation.  I think we need to evaluate his aortic and mitral valves more closely, will need to decide on whether they need surgical repair/replacement.  - I am going to arrange for a TEE.  We discussed risks/benefits of procedure and he agrees to it.  2. Chronic diastolic CHF: Baseline has at least moderate valvular disease as above (moderate AS, moderate AI, moderate-severe MR).  Suspect recent diastolic CHF exacerbation was triggered by poorly tolerated paroxysmal atrial fibrillation in setting of valvular disease.  On exam today, he looks near-euvolemic.  - Continue Lasix 40 mg daily. BMET/BNP today.  - Cannot rule out ischemia playing a role in his exertional dyspnea.  ECG shows possible old ASMI.  He will need right and left heart cath.  We discussed risks/benefits and he agrees to proceed.  He will hold Eliquis 2 days prior to the procedure.  3. CKD: Stage 3.  Most recent creatinine 1.4.  BMET today.  4. Atrial  fibrillation: Paroxysmal.  He tends to tolerate afib poorly.  He is now maintaining NSR on amiodarone.  - Continue amiodarone, can decrease to 200 mg daily.  He will need LFTs and TSH drawn today.  I will arrange for baseline PFTs.  He will need regular eye exam.  QTc only very mildly prolonged, reviewed with pharmacy and think ok to continue amiodarone along with citalopram and prn trazodone.  - Continue apixaban.  - Would consider atrial fibrillation ablation (versus Maze if he has surgical valve repair) down the road to avoid long-term amiodarone.  5. COPD: Suspected.  He has quit smoking.  Will get PFTs as above.  6. CVA: Remote, minimal residual. S/p carotid stenting.  - No ASA given apixaban use.  - Statin stopped in hospital with elevated LFTs, will restart  lovastatin at prior home dose.  7. Anemia: Fe deficiency.  FOBT negative 8/18.  Got feraheme in 8/18.  Should have eventual GI workup.   Loralie Champagne 08/25/2017

## 2017-08-25 NOTE — Telephone Encounter (Signed)
Authorization Number [id number]

## 2017-08-28 DIAGNOSIS — D631 Anemia in chronic kidney disease: Secondary | ICD-10-CM | POA: Diagnosis not present

## 2017-08-28 DIAGNOSIS — I4891 Unspecified atrial fibrillation: Secondary | ICD-10-CM | POA: Diagnosis not present

## 2017-08-28 DIAGNOSIS — F3342 Major depressive disorder, recurrent, in full remission: Secondary | ICD-10-CM | POA: Diagnosis not present

## 2017-08-28 DIAGNOSIS — N183 Chronic kidney disease, stage 3 (moderate): Secondary | ICD-10-CM | POA: Diagnosis not present

## 2017-08-28 DIAGNOSIS — R1310 Dysphagia, unspecified: Secondary | ICD-10-CM | POA: Diagnosis not present

## 2017-08-28 DIAGNOSIS — I739 Peripheral vascular disease, unspecified: Secondary | ICD-10-CM | POA: Diagnosis not present

## 2017-08-28 DIAGNOSIS — I5032 Chronic diastolic (congestive) heart failure: Secondary | ICD-10-CM | POA: Diagnosis not present

## 2017-08-28 DIAGNOSIS — Z4789 Encounter for other orthopedic aftercare: Secondary | ICD-10-CM | POA: Diagnosis not present

## 2017-08-28 DIAGNOSIS — I10 Essential (primary) hypertension: Secondary | ICD-10-CM | POA: Diagnosis not present

## 2017-08-28 DIAGNOSIS — E782 Mixed hyperlipidemia: Secondary | ICD-10-CM | POA: Diagnosis not present

## 2017-08-28 DIAGNOSIS — I130 Hypertensive heart and chronic kidney disease with heart failure and stage 1 through stage 4 chronic kidney disease, or unspecified chronic kidney disease: Secondary | ICD-10-CM | POA: Diagnosis not present

## 2017-08-28 DIAGNOSIS — I2510 Atherosclerotic heart disease of native coronary artery without angina pectoris: Secondary | ICD-10-CM | POA: Diagnosis not present

## 2017-08-28 DIAGNOSIS — J449 Chronic obstructive pulmonary disease, unspecified: Secondary | ICD-10-CM | POA: Diagnosis not present

## 2017-08-28 DIAGNOSIS — I5033 Acute on chronic diastolic (congestive) heart failure: Secondary | ICD-10-CM | POA: Diagnosis not present

## 2017-08-28 DIAGNOSIS — I69331 Monoplegia of upper limb following cerebral infarction affecting right dominant side: Secondary | ICD-10-CM | POA: Diagnosis not present

## 2017-08-28 DIAGNOSIS — Z8673 Personal history of transient ischemic attack (TIA), and cerebral infarction without residual deficits: Secondary | ICD-10-CM | POA: Diagnosis not present

## 2017-08-28 DIAGNOSIS — Z09 Encounter for follow-up examination after completed treatment for conditions other than malignant neoplasm: Secondary | ICD-10-CM | POA: Diagnosis not present

## 2017-08-29 DIAGNOSIS — R1310 Dysphagia, unspecified: Secondary | ICD-10-CM | POA: Diagnosis not present

## 2017-08-29 DIAGNOSIS — I130 Hypertensive heart and chronic kidney disease with heart failure and stage 1 through stage 4 chronic kidney disease, or unspecified chronic kidney disease: Secondary | ICD-10-CM | POA: Diagnosis not present

## 2017-08-29 DIAGNOSIS — Z4789 Encounter for other orthopedic aftercare: Secondary | ICD-10-CM | POA: Diagnosis not present

## 2017-08-29 DIAGNOSIS — J449 Chronic obstructive pulmonary disease, unspecified: Secondary | ICD-10-CM | POA: Diagnosis not present

## 2017-08-29 DIAGNOSIS — I4891 Unspecified atrial fibrillation: Secondary | ICD-10-CM | POA: Diagnosis not present

## 2017-08-29 DIAGNOSIS — I5033 Acute on chronic diastolic (congestive) heart failure: Secondary | ICD-10-CM | POA: Diagnosis not present

## 2017-08-29 DIAGNOSIS — I69331 Monoplegia of upper limb following cerebral infarction affecting right dominant side: Secondary | ICD-10-CM | POA: Diagnosis not present

## 2017-08-29 DIAGNOSIS — N183 Chronic kidney disease, stage 3 (moderate): Secondary | ICD-10-CM | POA: Diagnosis not present

## 2017-08-29 DIAGNOSIS — D631 Anemia in chronic kidney disease: Secondary | ICD-10-CM | POA: Diagnosis not present

## 2017-08-30 ENCOUNTER — Encounter (HOSPITAL_COMMUNITY)
Admission: RE | Disposition: A | Discharge status: Home / Self Care | Payer: Self-pay | Source: Ambulatory Visit | Attending: Cardiology

## 2017-08-30 ENCOUNTER — Ambulatory Visit (HOSPITAL_BASED_OUTPATIENT_CLINIC_OR_DEPARTMENT_OTHER): Payer: Medicare HMO

## 2017-08-30 ENCOUNTER — Ambulatory Visit (HOSPITAL_COMMUNITY)
Admission: RE | Admit: 2017-08-30 | Discharge: 2017-08-30 | Disposition: A | Discharge status: Home / Self Care | Payer: Medicare HMO | Source: Ambulatory Visit | Attending: Cardiology | Admitting: Cardiology

## 2017-08-30 ENCOUNTER — Encounter (HOSPITAL_COMMUNITY): Payer: Self-pay | Admitting: *Deleted

## 2017-08-30 DIAGNOSIS — Z8673 Personal history of transient ischemic attack (TIA), and cerebral infarction without residual deficits: Secondary | ICD-10-CM | POA: Diagnosis not present

## 2017-08-30 DIAGNOSIS — I352 Nonrheumatic aortic (valve) stenosis with insufficiency: Secondary | ICD-10-CM | POA: Diagnosis not present

## 2017-08-30 DIAGNOSIS — Z7901 Long term (current) use of anticoagulants: Secondary | ICD-10-CM | POA: Insufficient documentation

## 2017-08-30 DIAGNOSIS — I350 Nonrheumatic aortic (valve) stenosis: Secondary | ICD-10-CM | POA: Diagnosis not present

## 2017-08-30 DIAGNOSIS — I130 Hypertensive heart and chronic kidney disease with heart failure and stage 1 through stage 4 chronic kidney disease, or unspecified chronic kidney disease: Secondary | ICD-10-CM | POA: Diagnosis not present

## 2017-08-30 DIAGNOSIS — K219 Gastro-esophageal reflux disease without esophagitis: Secondary | ICD-10-CM | POA: Insufficient documentation

## 2017-08-30 DIAGNOSIS — E785 Hyperlipidemia, unspecified: Secondary | ICD-10-CM | POA: Insufficient documentation

## 2017-08-30 DIAGNOSIS — D509 Iron deficiency anemia, unspecified: Secondary | ICD-10-CM | POA: Insufficient documentation

## 2017-08-30 DIAGNOSIS — I359 Nonrheumatic aortic valve disorder, unspecified: Secondary | ICD-10-CM | POA: Diagnosis not present

## 2017-08-30 DIAGNOSIS — Z87891 Personal history of nicotine dependence: Secondary | ICD-10-CM | POA: Diagnosis not present

## 2017-08-30 DIAGNOSIS — Z79899 Other long term (current) drug therapy: Secondary | ICD-10-CM | POA: Diagnosis not present

## 2017-08-30 DIAGNOSIS — I5032 Chronic diastolic (congestive) heart failure: Secondary | ICD-10-CM | POA: Diagnosis not present

## 2017-08-30 DIAGNOSIS — H9190 Unspecified hearing loss, unspecified ear: Secondary | ICD-10-CM | POA: Diagnosis not present

## 2017-08-30 DIAGNOSIS — I480 Paroxysmal atrial fibrillation: Secondary | ICD-10-CM | POA: Insufficient documentation

## 2017-08-30 HISTORY — PX: TEE WITHOUT CARDIOVERSION: SHX5443

## 2017-08-30 HISTORY — PX: RIGHT/LEFT HEART CATH AND CORONARY ANGIOGRAPHY: CATH118266

## 2017-08-30 LAB — POCT I-STAT 3, VENOUS BLOOD GAS (G3P V)
ACID-BASE DEFICIT: 4 mmol/L — AB (ref 0.0–2.0)
ACID-BASE DEFICIT: 6 mmol/L — AB (ref 0.0–2.0)
BICARBONATE: 19.1 mmol/L — AB (ref 20.0–28.0)
BICARBONATE: 20.9 mmol/L (ref 20.0–28.0)
O2 SAT: 60 %
O2 SAT: 66 %
PH VEN: 7.363 (ref 7.250–7.430)
PO2 VEN: 32 mmHg (ref 32.0–45.0)
PO2 VEN: 35 mmHg (ref 32.0–45.0)
TCO2: 20 mmol/L — ABNORMAL LOW (ref 22–32)
TCO2: 22 mmol/L (ref 22–32)
pCO2, Ven: 32.6 mmHg — ABNORMAL LOW (ref 44.0–60.0)
pCO2, Ven: 36.7 mmHg — ABNORMAL LOW (ref 44.0–60.0)
pH, Ven: 7.376 (ref 7.250–7.430)

## 2017-08-30 LAB — POCT ACTIVATED CLOTTING TIME: Activated Clotting Time: 158 seconds

## 2017-08-30 SURGERY — ECHOCARDIOGRAM, TRANSESOPHAGEAL
Anesthesia: Moderate Sedation

## 2017-08-30 SURGERY — RIGHT/LEFT HEART CATH AND CORONARY ANGIOGRAPHY
Anesthesia: LOCAL

## 2017-08-30 MED ORDER — MIDAZOLAM HCL 5 MG/ML IJ SOLN
INTRAMUSCULAR | Status: AC
  Filled 2017-08-30: qty 2

## 2017-08-30 MED ORDER — SODIUM CHLORIDE 0.9 % IV SOLN: INTRAVENOUS | Status: DC

## 2017-08-30 MED ORDER — FENTANYL CITRATE (PF) 100 MCG/2ML IJ SOLN
INTRAMUSCULAR | Status: AC
  Filled 2017-08-30: qty 2

## 2017-08-30 MED ORDER — SODIUM CHLORIDE 0.9% FLUSH: 3.0000 mL | INTRAVENOUS | Status: DC | PRN

## 2017-08-30 MED ORDER — ONDANSETRON HCL 4 MG/2ML IJ SOLN: 4.0000 mg | Freq: Four times a day (QID) | INTRAMUSCULAR | Status: DC | PRN

## 2017-08-30 MED ORDER — BUTAMBEN-TETRACAINE-BENZOCAINE 2-2-14 % EX AERO
INHALATION_SPRAY | CUTANEOUS | Status: DC | PRN
  Administered 2017-08-30: 2 via TOPICAL

## 2017-08-30 MED ORDER — HEPARIN SODIUM (PORCINE) 1000 UNIT/ML IJ SOLN
INTRAMUSCULAR | Status: DC | PRN
  Administered 2017-08-30: 5000 [IU] via INTRAVENOUS

## 2017-08-30 MED ORDER — FENTANYL CITRATE (PF) 100 MCG/2ML IJ SOLN
INTRAMUSCULAR | Status: DC | PRN
  Administered 2017-08-30: 25 ug via INTRAVENOUS

## 2017-08-30 MED ORDER — SODIUM CHLORIDE 0.9 % WEIGHT BASED INFUSION: 1.0000 mL/kg/h | INTRAVENOUS | Status: AC

## 2017-08-30 MED ORDER — LIDOCAINE HCL 2 % IJ SOLN
INTRAMUSCULAR | Status: AC
  Filled 2017-08-30: qty 10

## 2017-08-30 MED ORDER — SODIUM CHLORIDE 0.9 % IV SOLN
INTRAVENOUS | Status: AC | PRN
  Administered 2017-08-30: 500 mL via INTRAVENOUS

## 2017-08-30 MED ORDER — SODIUM CHLORIDE 0.9% FLUSH: 3.0000 mL | Freq: Two times a day (BID) | INTRAVENOUS | Status: DC

## 2017-08-30 MED ORDER — SODIUM CHLORIDE 0.9 % IV SOLN: 250.0000 mL | INTRAVENOUS | Status: DC | PRN

## 2017-08-30 MED ORDER — ASPIRIN 81 MG PO CHEW
81.0000 mg | CHEWABLE_TABLET | ORAL | Status: DC
  Filled 2017-08-30: qty 1

## 2017-08-30 MED ORDER — ACETAMINOPHEN 325 MG PO TABS: 650.0000 mg | ORAL_TABLET | ORAL | Status: DC | PRN

## 2017-08-30 MED ORDER — VERAPAMIL HCL 2.5 MG/ML IV SOLN
INTRAVENOUS | Status: AC
  Filled 2017-08-30: qty 2

## 2017-08-30 MED ORDER — LIDOCAINE HCL (PF) 1 % IJ SOLN
INTRAMUSCULAR | Status: DC | PRN
  Administered 2017-08-30: 1 mL
  Administered 2017-08-30: 15 mL

## 2017-08-30 MED ORDER — MIDAZOLAM HCL 2 MG/2ML IJ SOLN
INTRAMUSCULAR | Status: DC | PRN
  Administered 2017-08-30: 1 mg via INTRAVENOUS

## 2017-08-30 MED ORDER — SODIUM CHLORIDE 0.9 % IV SOLN
INTRAVENOUS | Status: AC | PRN
  Administered 2017-08-30: 10 mL/h via INTRAVENOUS

## 2017-08-30 MED ORDER — HEPARIN (PORCINE) IN NACL 2-0.9 UNIT/ML-% IJ SOLN
INTRAMUSCULAR | Status: AC
  Filled 2017-08-30: qty 1000

## 2017-08-30 MED ORDER — IOPAMIDOL (ISOVUE-370) INJECTION 76%
INTRAVENOUS | Status: DC | PRN
  Administered 2017-08-30: 95 mL via INTRAVENOUS

## 2017-08-30 MED ORDER — ASPIRIN 81 MG PO CHEW: 81.0000 mg | CHEWABLE_TABLET | ORAL | Status: DC

## 2017-08-30 MED ORDER — MIDAZOLAM HCL 10 MG/2ML IJ SOLN
INTRAMUSCULAR | Status: DC | PRN
  Administered 2017-08-30 (×2): 2 mg via INTRAVENOUS

## 2017-08-30 MED ORDER — HEPARIN (PORCINE) IN NACL 2-0.9 UNIT/ML-% IJ SOLN
INTRAMUSCULAR | Status: DC | PRN
  Administered 2017-08-30: 3 mL via INTRA_ARTERIAL

## 2017-08-30 MED ORDER — HEPARIN (PORCINE) IN NACL 2-0.9 UNIT/ML-% IJ SOLN
INTRAMUSCULAR | Status: AC | PRN
  Administered 2017-08-30: 1000 mL

## 2017-08-30 MED ORDER — FENTANYL CITRATE (PF) 100 MCG/2ML IJ SOLN
INTRAMUSCULAR | Status: DC | PRN
  Administered 2017-08-30 (×2): 25 ug via INTRAVENOUS

## 2017-08-30 MED ORDER — IOPAMIDOL (ISOVUE-370) INJECTION 76%
INTRAVENOUS | Status: AC
  Filled 2017-08-30: qty 100

## 2017-08-30 MED ORDER — HEPARIN SODIUM (PORCINE) 1000 UNIT/ML IJ SOLN
INTRAMUSCULAR | Status: AC
  Filled 2017-08-30: qty 1

## 2017-08-30 MED ORDER — MIDAZOLAM HCL 2 MG/2ML IJ SOLN
INTRAMUSCULAR | Status: AC
  Filled 2017-08-30: qty 2

## 2017-08-30 SURGICAL SUPPLY — 13 items
CATH EXPO 5F FL3.5 (CATHETERS) ×2 IMPLANT
CATH INFINITI 5 FR 3DRC (CATHETERS) ×2 IMPLANT
CATH INFINITI JR4 5F (CATHETERS) ×2 IMPLANT
CATH SWAN GANZ 7F STRAIGHT (CATHETERS) ×2 IMPLANT
GLIDESHEATH SLEND SS 6F .021 (SHEATH) ×2 IMPLANT
GUIDEWIRE INQWIRE 1.5J.035X260 (WIRE) ×1 IMPLANT
INQWIRE 1.5J .035X260CM (WIRE) ×2
KIT HEART LEFT (KITS) ×2 IMPLANT
PACK CARDIAC CATHETERIZATION (CUSTOM PROCEDURE TRAY) ×2 IMPLANT
SHEATH GLIDE SLENDER 4/5FR (SHEATH) ×2 IMPLANT
SHEATH PINNACLE 7F 10CM (SHEATH) ×2 IMPLANT
TRANSDUCER W/STOPCOCK (MISCELLANEOUS) ×2 IMPLANT
TUBING CIL FLEX 10 FLL-RA (TUBING) ×4 IMPLANT

## 2017-08-30 NOTE — Progress Notes (Signed)
Site area: right groin  Site Prior to Removal: level 0     Pressure Applied For 15 MINUTES   Minutes Beginning at 1315    Manual: yes   Patient Status During Pull: a/o, without complaints  Post Pull Groin Site: level 0  Post Pull Instructions Given:yes, verbalized understanding   Post Pull Pulses Present: right DP palp   Dressing Applied:gaze, teagderm   Comments:

## 2017-08-30 NOTE — CV Procedure (Signed)
Procedure: TEE  Indication: Aortic and mitral valve disease  Sedation: Versed 4 mg IV, Fentanyl 50 mcg IV  Findings: Please see echo section for full report.  Mildly dilated LV with basal to mid inferolateral hypokinesis and basal inferior akinesis.  EF 50%.  Normal right ventricular size and systolic function.  Moderate left atrial enlargement, no LA appendage thrombus.  Normal right atrial size.  No PFO or ASD, negative bubble study.  The posterior leaflet of the mitral valve is restricted, there is mitral regurgitation that appears to be severe (PISA ERO 0.4 cm^2 and MR regurgitant volume 80 cc).  There was no systolic flow reversal in the pulmonary veins.  The aortic valve was moderately calcified.  Mean gradient 22 mmHg with AVA 1.46 cm^2 suggests moderate stenosis.  Moderate to severe aortic insufficiency with jet width/LVOT width 0.60.  Normal caliber aorta with mild plaque in the descending thoracic aorta.  Trivial TR.  Trivial PI.   Impression:  1. LV mildly dilated with EF 50% with wall motion abnormalities noted above.  2. Suspect severe mitral regurgitation.  There is restriction of the posterior leaflet, possible infarct-related MR given wall motion abnormalities.  3. Moderate aortic stenosis with moderate to severe aortic insufficiency.   Brandon [name] 08/30/2017 11:15 AM

## 2017-08-30 NOTE — Progress Notes (Signed)
  Echocardiogram Echocardiogram Transesophageal has been performed.  Tresa Res 08/30/2017, 12:39 PM

## 2017-08-30 NOTE — H&P (View-Only) (Signed)
PCP: Dr. Justin Mend Cardiology: Dr. Radford Pax HF Cardiology: Dr. Aundra Dubin  71 yo with history of CVA, paroxysmal atrial fibrillation, valvular heart disease, and chronic diastolic CHF presents for evaluation of CHF and AS/MR.    He had had episodes of dyspnea and initial workup led to a TEE in 5/18 showing normal EF with moderate AS and moderate MR.  He continued to have episodic dyspnea and ended up admitted in 8/18 with shortness of breath and chest pressure.  This led to a long, complicated hospitalization.  He was noted to be volume overloaded with acute diastolic CHF and was also noted to have symptomatic runs of atrial fibrillation with RVR.  Amiodarone was started to control the atrial fibrillation.  He developed respiratory distress => Bipap => intubated.  He became hypotensive, requiring pressors.  He developed AKI.  PNA was noted and he received broad spectrum abx.  He had a prolonged intubation but was eventually extubated.  Given deconditioning from long hospitalization, he went to CIR for a couple of weeks.   He is now back home again.  He is doing considerably better overall.  Still on dysphagia diet due to aspiration noted post long intubation.  Vocal cord function is much better, no longer hoarse.  He is now getting home PT.  No dyspnea walking on flat ground.  Using walker for stability as he has significant muscle weakness/deconditioning.  Easily fatigued. No tachypalpitations, he is in NSR today.  No orthopnea/PND.  No chest pain.    ECG (personally reviewed): NSR, old ASMI, QTc 472 msec  Labs (9/18): K 4.5, creatinine 1.42  PMH: 1. CVA: Right MCA, 2004.  Minimal residual problems.  2. HTN 3. Hyperlipidemia 4. Left rotator cuff surgery 6/18 5. Carotid stenosis: s/p right carotid stent.  6. GERD 7. H/o CCY 8. Anemia: FOBT negative.  9. Gout  10. Atrial fibrillation: Paroxysmal.  11. Valvular heart disease: TEE (5/18) with EF 55-60%, moderate AS mean 33 mmHg, moderate MR, normal RV  size and systolic function.  - Echo (8/18): EF 55-60%, moderate LVH, moderate AS with mean gradient 33 mmHg, moderate AI, moderate to severe MR.  12. Chronic diastolic CHF 13. Deafness 14. CKD: stage 3.  15. Suspect COPD  SH: Quit smoking 8/18.  Married, 2 children, retired.    Family History  Problem Relation Age of Onset  . Stroke Father        No details  . Angina Mother    ROS: All systems reviewed and negative except as per HPI.   Current Outpatient Prescriptions  Medication Sig Dispense Refill  . amiodarone (PACERONE) 200 MG tablet Take 1 tablet (200 mg total) by mouth daily. 60 tablet 12  . apixaban (ELIQUIS) 5 MG TABS tablet Take 1 tablet (5 mg total) by mouth 2 (two) times daily. 60 tablet 12  . citalopram (CELEXA) 10 MG tablet Take 1 tablet (10 mg total) by mouth at bedtime. 30 tablet 1  . docusate sodium (COLACE) 100 MG capsule Take 1 capsule (100 mg total) by mouth 3 (three) times daily as needed. 20 capsule 0  . ferrous sulfate 325 (65 FE) MG tablet Take 1 tablet (325 mg total) by mouth daily with breakfast. 30 tablet 3  . furosemide (LASIX) 40 MG tablet Take 1 tablet (40 mg total) by mouth daily. 30 tablet 12  . LORazepam (ATIVAN) 0.5 MG tablet Take 1 tablet (0.5 mg total) by mouth every 6 (six) hours as needed for anxiety (aggitation). 20 tablet 0  .  nitroGLYCERIN (NITROSTAT) 0.4 MG SL tablet Place 1 tablet (0.4 mg total) under the tongue every 5 (five) minutes x 3 doses as needed for chest pain. 30 tablet 12  . pantoprazole (PROTONIX) 20 MG tablet Take 1 tablet (20 mg total) by mouth daily. 30 tablet 0  . potassium chloride SA (K-DUR,KLOR-CON) 20 MEQ tablet Take 1 tablet (20 mEq total) by mouth daily. 30 tablet 0  . traZODone (DESYREL) 50 MG tablet Take 1 tablet (50 mg total) by mouth at bedtime. 30 tablet 0  . lovastatin (MEVACOR) 20 MG tablet Take 1 tablet (20 mg total) by mouth at bedtime. 30 tablet 3  . oxyCODONE-acetaminophen (ROXICET) 5-325 MG tablet Take 1-2  tablets by mouth every 4 (four) hours as needed for severe pain. (Patient not taking: Reported on 08/24/2017) 30 tablet 0   No current facility-administered medications for this encounter.    BP (!) 148/68   Pulse 80   Wt 231 lb 12.8 oz (105.1 kg)   SpO2 98%   BMI 34.23 kg/m  General: NAD Neck: No JVD, no thyromegaly or thyroid nodule.  Lungs: Clear to auscultation bilaterally with normal respiratory effort. CV: Nondisplaced PMI.  Heart regular S1/S2, no Z6/X0, 2/6 systolic murmur RUSB and apex.  Trace edema.  No carotid bruit.  Normal pedal pulses.  Abdomen: Soft, nontender, no hepatosplenomegaly, no distention.  Skin: Intact without lesions or rashes.  Neurologic: Alert and oriented x 3.  Psych: Normal affect. Extremities: No clubbing or cyanosis.  HEENT: Normal.   Assessment/Plan: 1. Valvular heart disease: Last echo in 8/18 showed moderate AS, moderate AI, and moderate to severe MR.  I am concerned that his valvular disease played a role in recent diastolic CHF exacerbation.  I think we need to evaluate his aortic and mitral valves more closely, will need to decide on whether they need surgical repair/replacement.  - I am going to arrange for a TEE.  We discussed risks/benefits of procedure and he agrees to it.  2. Chronic diastolic CHF: Baseline has at least moderate valvular disease as above (moderate AS, moderate AI, moderate-severe MR).  Suspect recent diastolic CHF exacerbation was triggered by poorly tolerated paroxysmal atrial fibrillation in setting of valvular disease.  On exam today, he looks near-euvolemic.  - Continue Lasix 40 mg daily. BMET/BNP today.  - Cannot rule out ischemia playing a role in his exertional dyspnea.  ECG shows possible old ASMI.  He will need right and left heart cath.  We discussed risks/benefits and he agrees to proceed.  He will hold Eliquis 2 days prior to the procedure.  3. CKD: Stage 3.  Most recent creatinine 1.4.  BMET today.  4. Atrial  fibrillation: Paroxysmal.  He tends to tolerate afib poorly.  He is now maintaining NSR on amiodarone.  - Continue amiodarone, can decrease to 200 mg daily.  He will need LFTs and TSH drawn today.  I will arrange for baseline PFTs.  He will need regular eye exam.  QTc only very mildly prolonged, reviewed with pharmacy and think ok to continue amiodarone along with citalopram and prn trazodone.  - Continue apixaban.  - Would consider atrial fibrillation ablation (versus Maze if he has surgical valve repair) down the road to avoid long-term amiodarone.  5. COPD: Suspected.  He has quit smoking.  Will get PFTs as above.  6. CVA: Remote, minimal residual. S/p carotid stenting.  - No ASA given apixaban use.  - Statin stopped in hospital with elevated LFTs, will restart  lovastatin at prior home dose.  7. Anemia: Fe deficiency.  FOBT negative 8/18.  Got feraheme in 8/18.  Should have eventual GI workup.   Loralie Champagne 08/25/2017

## 2017-08-30 NOTE — Interval H&P Note (Signed)
History and Physical Interval Note:  08/30/2017 10:34 AM  Brandon [name]  has presented today for surgery, with the diagnosis of CHF  The various methods of treatment have been discussed with the patient and family. After consideration of risks, benefits and other options for treatment, the patient has consented to  Procedure(s): TRANSESOPHAGEAL ECHOCARDIOGRAM (TEE) (N/A) as a surgical intervention .  The patient's history has been reviewed, patient examined, no change in status, stable for surgery.  I have reviewed the patient's chart and labs.  Questions were answered to the patient's satisfaction.     Merridith Dershem [employer]

## 2017-08-30 NOTE — Interval H&P Note (Signed)
History and Physical Interval Note:  08/30/2017 12:05 PM  Brandon [name]  has presented today for surgery, with the diagnosis of chf  The various methods of treatment have been discussed with the patient and family. After consideration of risks, benefits and other options for treatment, the patient has consented to  Procedure(s): RIGHT/LEFT HEART CATH AND CORONARY ANGIOGRAPHY (N/A) as a surgical intervention .  The patient's history has been reviewed, patient examined, no change in status, stable for surgery.  I have reviewed the patient's chart and labs.  Questions were answered to the patient's satisfaction.     Lois Slagel [employer]

## 2017-08-30 NOTE — Discharge Instructions (Signed)
Femoral Site Care °Refer to this sheet in the next few weeks. These instructions provide you with information about caring for yourself after your procedure. Your health care provider may also give you more specific instructions. Your treatment has been planned according to current medical practices, but problems sometimes occur. Call your health care provider if you have any problems or questions after your procedure. °What can I expect after the procedure? °After your procedure, it is typical to have the following: °· Bruising at the site that usually fades within 1-2 weeks. °· Blood collecting in the tissue (hematoma) that may be painful to the touch. It should usually decrease in size and tenderness within 1-2 weeks. ° °Follow these instructions at home: °· Take medicines only as directed by your health care provider. °· You may shower 24-48 hours after the procedure or as directed by your health care provider. Remove the bandage (dressing) and gently wash the site with plain soap and water. Pat the area dry with a clean towel. Do not rub the site, because this may cause bleeding. °· Do not take baths, swim, or use a hot tub until your health care provider approves. °· Check your insertion site every day for redness, swelling, or drainage. °· Do not apply powder or lotion to the site. °· Limit use of stairs to twice a day for the first 2-3 days or as directed by your health care provider. °· Do not squat for the first 2-3 days or as directed by your health care provider. °· Do not lift over 10 lb (4.5 kg) for 5 days after your procedure or as directed by your health care provider. °· Ask your health care provider when it is okay to: °? Return to work or school. °? Resume usual physical activities or sports. °? Resume sexual activity. °· Do not drive home if you are discharged the same day as the procedure. Have someone else drive you. °· You may drive 24 hours after the procedure unless otherwise instructed by  your health care provider. °· Do not operate machinery or power tools for 24 hours after the procedure or as directed by your health care provider. °· If your procedure was done as an outpatient procedure, which means that you went home the same day as your procedure, a responsible adult should be with you for the first 24 hours after you arrive home. °· Keep all follow-up visits as directed by your health care provider. This is important. °Contact a health care provider if: °· You have a fever. °· You have chills. °· You have increased bleeding from the site. Hold pressure on the site. °Get help right away if: °· You have unusual pain at the site. °· You have redness, warmth, or swelling at the site. °· You have drainage (other than a small amount of blood on the dressing) from the site. °· The site is bleeding, and the bleeding does not stop after 30 minutes of holding steady pressure on the site. °· Your leg or foot becomes pale, cool, tingly, or numb. °This information is not intended to replace advice given to you by your health care provider. Make sure you discuss any questions you have with your health care provider. °Document Released: 08/01/2014 Document Revised: 05/05/2016 Document Reviewed: 06/17/2014 °Elsevier Interactive Patient Education © 2018 [employer] ° °Radial Site Care °Refer to this sheet in the next few weeks. These instructions provide you with information about caring for yourself after your procedure. Your health   care provider may also give you more specific instructions. Your treatment has been planned according to current medical practices, but problems sometimes occur. Call your health care provider if you have any problems or questions after your procedure. °What can I expect after the procedure? °After your procedure, it is typical to have the following: °· Bruising at the radial site that usually fades within 1-2 weeks. °· Blood collecting in the tissue (hematoma) that may be  painful to the touch. It should usually decrease in size and tenderness within 1-2 weeks. ° °Follow these instructions at home: °· Take medicines only as directed by your health care provider. °· You may shower 24-48 hours after the procedure or as directed by your health care provider. Remove the bandage (dressing) and gently wash the site with plain soap and water. Pat the area dry with a clean towel. Do not rub the site, because this may cause bleeding. °· Do not take baths, swim, or use a hot tub until your health care provider approves. °· Check your insertion site every day for redness, swelling, or drainage. °· Do not apply powder or lotion to the site. °· Do not flex or bend the affected arm for 24 hours or as directed by your health care provider. °· Do not push or pull heavy objects with the affected arm for 24 hours or as directed by your health care provider. °· Do not lift over 10 lb (4.5 kg) for 5 days after your procedure or as directed by your health care provider. °· Ask your health care provider when it is okay to: °? Return to work or school. °? Resume usual physical activities or sports. °? Resume sexual activity. °· Do not drive home if you are discharged the same day as the procedure. Have someone else drive you. °· You may drive 24 hours after the procedure unless otherwise instructed by your health care provider. °· Do not operate machinery or power tools for 24 hours after the procedure. °· If your procedure was done as an outpatient procedure, which means that you went home the same day as your procedure, a responsible adult should be with you for the first 24 hours after you arrive home. °· Keep all follow-up visits as directed by your health care provider. This is important. °Contact a health care provider if: °· You have a fever. °· You have chills. °· You have increased bleeding from the radial site. Hold pressure on the site. °Get help right away if: °· You have unusual pain at the  radial site. °· You have redness, warmth, or swelling at the radial site. °· You have drainage (other than a small amount of blood on the dressing) from the radial site. °· The radial site is bleeding, and the bleeding does not stop after 30 minutes of holding steady pressure on the site. °· Your arm or hand becomes pale, cool, tingly, or numb. °This information is not intended to replace advice given to you by your health care provider. Make sure you discuss any questions you have with your health care provider. °Document Released: 12/31/2010 Document Revised: 05/05/2016 Document Reviewed: 06/16/2014 °Elsevier Interactive Patient Education © 2018 [employer] ° ° °

## 2017-08-31 ENCOUNTER — Encounter (HOSPITAL_COMMUNITY): Payer: Self-pay | Admitting: Cardiology

## 2017-08-31 ENCOUNTER — Ambulatory Visit: Payer: Medicare HMO | Admitting: Internal Medicine

## 2017-08-31 DIAGNOSIS — I130 Hypertensive heart and chronic kidney disease with heart failure and stage 1 through stage 4 chronic kidney disease, or unspecified chronic kidney disease: Secondary | ICD-10-CM | POA: Diagnosis not present

## 2017-08-31 DIAGNOSIS — J449 Chronic obstructive pulmonary disease, unspecified: Secondary | ICD-10-CM | POA: Diagnosis not present

## 2017-08-31 DIAGNOSIS — Z4789 Encounter for other orthopedic aftercare: Secondary | ICD-10-CM | POA: Diagnosis not present

## 2017-08-31 DIAGNOSIS — R1310 Dysphagia, unspecified: Secondary | ICD-10-CM | POA: Diagnosis not present

## 2017-08-31 DIAGNOSIS — I5033 Acute on chronic diastolic (congestive) heart failure: Secondary | ICD-10-CM | POA: Diagnosis not present

## 2017-08-31 DIAGNOSIS — I69331 Monoplegia of upper limb following cerebral infarction affecting right dominant side: Secondary | ICD-10-CM | POA: Diagnosis not present

## 2017-08-31 DIAGNOSIS — N183 Chronic kidney disease, stage 3 (moderate): Secondary | ICD-10-CM | POA: Diagnosis not present

## 2017-08-31 DIAGNOSIS — D631 Anemia in chronic kidney disease: Secondary | ICD-10-CM | POA: Diagnosis not present

## 2017-08-31 DIAGNOSIS — I4891 Unspecified atrial fibrillation: Secondary | ICD-10-CM | POA: Diagnosis not present

## 2017-09-01 ENCOUNTER — Ambulatory Visit (HOSPITAL_COMMUNITY)
Admission: RE | Admit: 2017-09-01 | Discharge: 2017-09-01 | Disposition: A | Discharge status: Home / Self Care | Payer: Medicare HMO | Source: Ambulatory Visit | Attending: Cardiology | Admitting: Cardiology

## 2017-09-01 ENCOUNTER — Encounter (HOSPITAL_COMMUNITY): Payer: Medicare HMO

## 2017-09-01 DIAGNOSIS — J449 Chronic obstructive pulmonary disease, unspecified: Secondary | ICD-10-CM | POA: Diagnosis not present

## 2017-09-01 LAB — PULMONARY FUNCTION TEST
DL/VA % pred: 53 %
DL/VA: 2.51 ml/min/mmHg/L
DLCO UNC % PRED: 44 %
DLCO unc: 15.57 ml/min/mmHg
FEF 25-75 PRE: 2.4 L/s
FEF 25-75 Post: 2.76 L/sec
FEF2575-%Change-Post: 15 %
FEF2575-%PRED-PRE: 92 %
FEF2575-%Pred-Post: 106 %
FEV1-%CHANGE-POST: 12 %
FEV1-%PRED-POST: 92 %
FEV1-%Pred-Pre: 82 %
FEV1-POST: 3.2 L
FEV1-PRE: 2.85 L
FEV1FVC-%CHANGE-POST: 18 %
FEV1FVC-%PRED-PRE: 90 %
FEV6-%Change-Post: -4 %
FEV6-%Pred-Post: 91 %
FEV6-%Pred-Pre: 95 %
FEV6-Post: 4.05 L
FEV6-Pre: 4.24 L
FEV6FVC-%Change-Post: 0 %
FEV6FVC-%PRED-POST: 106 %
FEV6FVC-%Pred-Pre: 105 %
FVC-%Change-Post: -5 %
FVC-%PRED-PRE: 91 %
FVC-%Pred-Post: 86 %
FVC-PRE: 4.29 L
FVC-Post: 4.06 L
PRE FEV1/FVC RATIO: 66 %
Post FEV1/FVC ratio: 79 %
Post FEV6/FVC ratio: 100 %
Pre FEV6/FVC Ratio: 99 %
RV % PRED: 94 %
RV: 2.44 L
TLC % PRED: 89 %
TLC: 6.64 L

## 2017-09-01 MED ORDER — ALBUTEROL SULFATE (2.5 MG/3ML) 0.083% IN NEBU
2.5000 mg | INHALATION_SOLUTION | Freq: Once | RESPIRATORY_TRACT | Status: AC
  Administered 2017-09-01: 2.5 mg via RESPIRATORY_TRACT

## 2017-09-01 NOTE — Progress Notes (Signed)
Thanks

## 2017-09-04 ENCOUNTER — Telehealth: Payer: Self-pay | Admitting: Internal Medicine

## 2017-09-04 DIAGNOSIS — I69331 Monoplegia of upper limb following cerebral infarction affecting right dominant side: Secondary | ICD-10-CM | POA: Diagnosis not present

## 2017-09-04 DIAGNOSIS — R1310 Dysphagia, unspecified: Secondary | ICD-10-CM | POA: Diagnosis not present

## 2017-09-04 DIAGNOSIS — I130 Hypertensive heart and chronic kidney disease with heart failure and stage 1 through stage 4 chronic kidney disease, or unspecified chronic kidney disease: Secondary | ICD-10-CM | POA: Diagnosis not present

## 2017-09-04 DIAGNOSIS — D631 Anemia in chronic kidney disease: Secondary | ICD-10-CM | POA: Diagnosis not present

## 2017-09-04 DIAGNOSIS — I4891 Unspecified atrial fibrillation: Secondary | ICD-10-CM | POA: Diagnosis not present

## 2017-09-04 DIAGNOSIS — I5033 Acute on chronic diastolic (congestive) heart failure: Secondary | ICD-10-CM | POA: Diagnosis not present

## 2017-09-04 DIAGNOSIS — J449 Chronic obstructive pulmonary disease, unspecified: Secondary | ICD-10-CM | POA: Diagnosis not present

## 2017-09-04 DIAGNOSIS — N183 Chronic kidney disease, stage 3 (moderate): Secondary | ICD-10-CM | POA: Diagnosis not present

## 2017-09-04 DIAGNOSIS — Z4789 Encounter for other orthopedic aftercare: Secondary | ICD-10-CM | POA: Diagnosis not present

## 2017-09-04 NOTE — Telephone Encounter (Signed)
New message    Pt states husband is being seen by Dr. Marigene Ehlers and other cardiologists and says we are able to look in his chart without them having to make an appt. Wants to make nurse aware

## 2017-09-05 DIAGNOSIS — J449 Chronic obstructive pulmonary disease, unspecified: Secondary | ICD-10-CM | POA: Diagnosis not present

## 2017-09-05 DIAGNOSIS — I4891 Unspecified atrial fibrillation: Secondary | ICD-10-CM | POA: Diagnosis not present

## 2017-09-05 DIAGNOSIS — D631 Anemia in chronic kidney disease: Secondary | ICD-10-CM | POA: Diagnosis not present

## 2017-09-05 DIAGNOSIS — I69331 Monoplegia of upper limb following cerebral infarction affecting right dominant side: Secondary | ICD-10-CM | POA: Diagnosis not present

## 2017-09-05 DIAGNOSIS — I5033 Acute on chronic diastolic (congestive) heart failure: Secondary | ICD-10-CM | POA: Diagnosis not present

## 2017-09-05 DIAGNOSIS — N183 Chronic kidney disease, stage 3 (moderate): Secondary | ICD-10-CM | POA: Diagnosis not present

## 2017-09-05 DIAGNOSIS — R1310 Dysphagia, unspecified: Secondary | ICD-10-CM | POA: Diagnosis not present

## 2017-09-05 DIAGNOSIS — I130 Hypertensive heart and chronic kidney disease with heart failure and stage 1 through stage 4 chronic kidney disease, or unspecified chronic kidney disease: Secondary | ICD-10-CM | POA: Diagnosis not present

## 2017-09-05 DIAGNOSIS — Z4789 Encounter for other orthopedic aftercare: Secondary | ICD-10-CM | POA: Diagnosis not present

## 2017-09-05 NOTE — Telephone Encounter (Signed)
Will route to Dr. Harrington Challenger for review as Juluis Rainier.

## 2017-09-06 ENCOUNTER — Encounter: Payer: Medicare HMO | Attending: Physical Medicine & Rehabilitation | Admitting: Physical Medicine & Rehabilitation

## 2017-09-06 ENCOUNTER — Encounter: Payer: Self-pay | Admitting: Physical Medicine & Rehabilitation

## 2017-09-06 VITALS — BP 127/73 | HR 85

## 2017-09-06 DIAGNOSIS — I69351 Hemiplegia and hemiparesis following cerebral infarction affecting right dominant side: Secondary | ICD-10-CM | POA: Insufficient documentation

## 2017-09-06 DIAGNOSIS — J441 Chronic obstructive pulmonary disease with (acute) exacerbation: Secondary | ICD-10-CM | POA: Diagnosis not present

## 2017-09-06 DIAGNOSIS — E785 Hyperlipidemia, unspecified: Secondary | ICD-10-CM | POA: Diagnosis not present

## 2017-09-06 DIAGNOSIS — G4730 Sleep apnea, unspecified: Secondary | ICD-10-CM | POA: Insufficient documentation

## 2017-09-06 DIAGNOSIS — F419 Anxiety disorder, unspecified: Secondary | ICD-10-CM | POA: Diagnosis not present

## 2017-09-06 DIAGNOSIS — F329 Major depressive disorder, single episode, unspecified: Secondary | ICD-10-CM | POA: Diagnosis not present

## 2017-09-06 DIAGNOSIS — I69331 Monoplegia of upper limb following cerebral infarction affecting right dominant side: Secondary | ICD-10-CM | POA: Diagnosis not present

## 2017-09-06 DIAGNOSIS — I2510 Atherosclerotic heart disease of native coronary artery without angina pectoris: Secondary | ICD-10-CM | POA: Insufficient documentation

## 2017-09-06 DIAGNOSIS — J449 Chronic obstructive pulmonary disease, unspecified: Secondary | ICD-10-CM | POA: Diagnosis not present

## 2017-09-06 DIAGNOSIS — I10 Essential (primary) hypertension: Secondary | ICD-10-CM | POA: Diagnosis not present

## 2017-09-06 DIAGNOSIS — Z7901 Long term (current) use of anticoagulants: Secondary | ICD-10-CM | POA: Diagnosis not present

## 2017-09-06 DIAGNOSIS — K219 Gastro-esophageal reflux disease without esophagitis: Secondary | ICD-10-CM | POA: Diagnosis not present

## 2017-09-06 DIAGNOSIS — R7303 Prediabetes: Secondary | ICD-10-CM | POA: Diagnosis not present

## 2017-09-06 DIAGNOSIS — J9601 Acute respiratory failure with hypoxia: Secondary | ICD-10-CM | POA: Insufficient documentation

## 2017-09-06 DIAGNOSIS — I4891 Unspecified atrial fibrillation: Secondary | ICD-10-CM | POA: Diagnosis not present

## 2017-09-06 DIAGNOSIS — I350 Nonrheumatic aortic (valve) stenosis: Secondary | ICD-10-CM | POA: Diagnosis not present

## 2017-09-06 DIAGNOSIS — R5381 Other malaise: Secondary | ICD-10-CM | POA: Diagnosis not present

## 2017-09-06 DIAGNOSIS — R531 Weakness: Secondary | ICD-10-CM | POA: Diagnosis present

## 2017-09-06 DIAGNOSIS — M549 Dorsalgia, unspecified: Secondary | ICD-10-CM | POA: Diagnosis not present

## 2017-09-06 DIAGNOSIS — F32A Depression, unspecified: Secondary | ICD-10-CM

## 2017-09-06 DIAGNOSIS — G8929 Other chronic pain: Secondary | ICD-10-CM | POA: Diagnosis not present

## 2017-09-06 DIAGNOSIS — N189 Chronic kidney disease, unspecified: Secondary | ICD-10-CM | POA: Insufficient documentation

## 2017-09-06 DIAGNOSIS — D631 Anemia in chronic kidney disease: Secondary | ICD-10-CM | POA: Diagnosis not present

## 2017-09-06 DIAGNOSIS — M5440 Lumbago with sciatica, unspecified side: Secondary | ICD-10-CM

## 2017-09-06 DIAGNOSIS — I5033 Acute on chronic diastolic (congestive) heart failure: Secondary | ICD-10-CM | POA: Diagnosis not present

## 2017-09-06 DIAGNOSIS — G4700 Insomnia, unspecified: Secondary | ICD-10-CM | POA: Diagnosis not present

## 2017-09-06 DIAGNOSIS — Z4789 Encounter for other orthopedic aftercare: Secondary | ICD-10-CM | POA: Diagnosis not present

## 2017-09-06 DIAGNOSIS — I130 Hypertensive heart and chronic kidney disease with heart failure and stage 1 through stage 4 chronic kidney disease, or unspecified chronic kidney disease: Secondary | ICD-10-CM | POA: Diagnosis not present

## 2017-09-06 DIAGNOSIS — N183 Chronic kidney disease, stage 3 (moderate): Secondary | ICD-10-CM | POA: Diagnosis not present

## 2017-09-06 DIAGNOSIS — R1310 Dysphagia, unspecified: Secondary | ICD-10-CM | POA: Diagnosis not present

## 2017-09-06 MED ORDER — TRAZODONE HCL 50 MG PO TABS: 50.0000 mg | ORAL_TABLET | Freq: Every day | ORAL | 5 refills | Status: DC

## 2017-09-06 MED ORDER — PANTOPRAZOLE SODIUM 20 MG PO TBEC: 20.0000 mg | DELAYED_RELEASE_TABLET | Freq: Every day | ORAL | 5 refills | Status: DC

## 2017-09-06 MED ORDER — CITALOPRAM HYDROBROMIDE 20 MG PO TABS
20.0000 mg | ORAL_TABLET | Freq: Every day | ORAL | 5 refills | Status: AC
Start: 2017-09-06 — End: ?

## 2017-09-06 NOTE — Patient Instructions (Signed)
PLEASE FEEL FREE TO CALL OUR OFFICE WITH ANY PROBLEMS OR QUESTIONS ([phone])      

## 2017-09-06 NOTE — Progress Notes (Signed)
Subjective:    Patient ID: Brandon [name], male    DOB: 11-Jul-1946, 71 y.o.   MRN: [id number]  HPI   Mr. Gladu is here in follow up of his CVA and anoxic encephalopathy. He has been doing well and rollator used at home. He is sob still with activity. He has been seen by cards and has severe MR and CAD. He is scheduled to see dr. Roxy Manns next week.   He is better with his pain control. His back will bother him when he's crouching or bending. He's using oxyocodone rarely.    Pain Inventory Average Pain 3 Pain Right Now 0 My pain is intermittent and aching  In the last 24 hours, has pain interfered with the following? General activity 0 Relation with others 0 Enjoyment of life 0 What TIME of day is your pain at its worst? evening Sleep (in general) Fair  Pain is worse with: bending Pain improves with: medication Relief from Meds: 10  Mobility use a walker ability to climb steps?  yes do you drive?  no  Function retired  Neuro/Psych weakness trouble walking anxiety  Prior Studies Any changes since last visit?  no  Physicians involved in your care Any changes since last visit?  no   Family History  Problem Relation Age of Onset  . Stroke Father        No details  . Angina Mother    Social History   Social History  . Marital status: Married    Spouse name: N/A  . Number of children: 3  . Years of education: N/A   Occupational History  .  Retired   Social History Main Topics  . Smoking status: Current Every Day Smoker    Packs/day: 0.50    Years: 57.00    Types: Cigarettes  . Smokeless tobacco: Never Used  . Alcohol use 1.2 - 1.8 oz/week    2 - 3 Glasses of wine per week     Comment: moderate wine  . Drug use: No  . Sexual activity: Yes   Other Topics Concern  . Not on file   Social History Narrative   Two living children.  Lives with wife.     Past Surgical History:  Procedure Laterality Date  . BACK SURGERY     lumbar back  .  CHOLECYSTECTOMY    . ERCP N/A 05/31/2013   Procedure: ENDOSCOPIC RETROGRADE CHOLANGIOPANCREATOGRAPHY (ERCP);  Surgeon: Ladene Artist, MD;  Location: [facility];  Service: Endoscopy;  Laterality: N/A;  . FOOT SURGERY     right  . LAPAROSCOPIC CHOLECYSTECTOMY SINGLE PORT N/A 06/01/2013   Procedure: LAPAROSCOPIC CHOLECYSTECTOMY SINGLE PORT;  Surgeon: Adin Hector, MD;  Location: [facility];  Service: General;  Laterality: N/A;  . POLYPECTOMY    . RIGHT/LEFT HEART CATH AND CORONARY ANGIOGRAPHY N/A 08/30/2017   Procedure: RIGHT/LEFT HEART CATH AND CORONARY ANGIOGRAPHY;  Surgeon: Larey Dresser, MD;  Location: [facility];  Service: Cardiovascular;  Laterality: N/A;  . SHOULDER ARTHROSCOPY WITH ROTATOR CUFF REPAIR AND SUBACROMIAL DECOMPRESSION Left 05/18/2017  . SHOULDER ARTHROSCOPY WITH ROTATOR CUFF REPAIR AND SUBACROMIAL DECOMPRESSION Left 05/18/2017   Procedure: SHOULDER ARTHROSCOPY WITH ROTATOR CUFF REPAIR AND SUBACROMIAL DECOMPRESSION;  Surgeon: Tania Ade, MD;  Location: [facility];  Service: Orthopedics;  Laterality: Left;  LEFT SHOULDER ARTHROSCOPY WITH ROTATOR CUFF REPAIR AND SUBACROMIAL DECOMPRESSION  . TEE WITHOUT CARDIOVERSION N/A 05/09/2017   Procedure: TRANSESOPHAGEAL ECHOCARDIOGRAM (TEE);  Surgeon: Skeet Latch, MD;  Location:  [facility];  Service: Cardiovascular;  Laterality: N/A;  . TEE WITHOUT CARDIOVERSION N/A 08/30/2017   Procedure: TRANSESOPHAGEAL ECHOCARDIOGRAM (TEE);  Surgeon: Larey Dresser, MD;  Location: [facility];  Service: Cardiovascular;  Laterality: N/A;  . TONSILLECTOMY     Past Medical History:  Diagnosis Date  . Anemia 07/13/2017  . Anxiety   . Aortic insufficiency   . Aortic stenosis, moderate 07/13/2017  . Arthritis    back   . Carotid stenosis    Right carotid stent (widely patent) 40 - 59% left plaque 11/13  . Depression   . Dyslipidemia   . GERD (gastroesophageal reflux disease)   . Heart murmur   . Hemiplegia affecting unspecified side,  late effect of cerebrovascular disease    resolved- from L side   . Hypertension   . Jaundice    resolved following ERCP & Cholecystectomy  . Mitral valve insufficiency and aortic valve insufficiency   . Pre-diabetes    per spouse  . Sleep apnea    does not wear CPAP  . Sleep concern    resulted in surgery- after + sleep test. Pt. doesn't have a problem any longer.   . Stroke ([facility]) 03/11/2003   stent placed on the 31, 3, 2004, L side   . Wears glasses   . Wears hearing aid in both ears   . Wears partial dentures    There were no vitals taken for this visit.  Opioid Risk Score:   Fall Risk Score:  `1  Depression screen PHQ 2/9  No flowsheet data found.   Review of Systems  Constitutional: Negative.   HENT: Negative.   Eyes: Negative.   Respiratory: Positive for cough, shortness of breath and wheezing.   Cardiovascular: Negative.   Gastrointestinal: Positive for nausea.  Endocrine: Negative.   Genitourinary: Negative.   Musculoskeletal: Negative.   Skin: Negative.   Allergic/Immunologic: Negative.   Neurological: Negative.   Hematological: Negative.   Psychiatric/Behavioral: Negative.   All other systems reviewed and are negative.      Objective:   Physical Exam  Constitutional: He appears well-developed. No distress Obese HENT: Normocephalic, atraumatic  Eyes: EOMI. No discharge. Cardiovascular: RRR without murmur. No JVD     Respiratory: CTA Bilaterally without wheezes or rales. Normal effort    GI: BS+, ND.  Musculoskeletal: no edema.   Neurological: He is alert, oriented to place, person/time.  Remains HOH. Gait normal. Sensation normal. Good insight and awareness. CN intact Motor: RUE:  5/5 right deltoid, bicep, tricep, hand grip Left upper extremity: 5/5 strength in the deltoid, bicep, tricep, hand grip (stable) Left lower extremity:  5/5 at the hip flexors, knee extensors, ankle dorsiflexors.  Skin: Skin is warm and dry.  Psychiatric: pleasant and  cooperative. More interactive       Assessment & Plan:  1. Debilitation and right upper extremity weakness due to history of CVA/acute hypoxemic respiratory failure. Patient was extubated 07/25/2017             -may drive locally for short distance only for now  -can be left alone for short periods of time at home.  2.  Pain Management/chronic back pain:   -oxycodone for breakthrough pain.  using sparingly           -  -discussed importance of posture and gait mechanics 3. Mood :   Patient on Wellbutrin 150 mg twice a day as well as Celexa 20 mg previously              -  increase celexa to 20mg  QHS  -hold on wellbutrin              -continue scheduled trazodone 4. Acute on chronic diastolic congestive heart failure/CV.              -EF 50%, severe MR  -CAD---[facility] to see next week.   5. Atrial fibrillation. Continue Eliquis. Amiodarone 200 mg twice a day 6. History of CVA. Follow-up per neurology services and advised to continue eliquis. 7. Recent left rotator cuff surgery. 8. COPD/tobacco abuse.  9. CKD                 15 minutes of face to face patient care time were spent during this visit. All questions were encouraged and answered. Follow up PRN

## 2017-09-07 ENCOUNTER — Other Ambulatory Visit (HOSPITAL_COMMUNITY): Payer: Self-pay | Admitting: *Deleted

## 2017-09-07 DIAGNOSIS — I69331 Monoplegia of upper limb following cerebral infarction affecting right dominant side: Secondary | ICD-10-CM | POA: Diagnosis not present

## 2017-09-07 DIAGNOSIS — I5033 Acute on chronic diastolic (congestive) heart failure: Secondary | ICD-10-CM | POA: Diagnosis not present

## 2017-09-07 DIAGNOSIS — I4891 Unspecified atrial fibrillation: Secondary | ICD-10-CM | POA: Diagnosis not present

## 2017-09-07 DIAGNOSIS — R1310 Dysphagia, unspecified: Secondary | ICD-10-CM | POA: Diagnosis not present

## 2017-09-07 DIAGNOSIS — Z4789 Encounter for other orthopedic aftercare: Secondary | ICD-10-CM | POA: Diagnosis not present

## 2017-09-07 DIAGNOSIS — D631 Anemia in chronic kidney disease: Secondary | ICD-10-CM | POA: Diagnosis not present

## 2017-09-07 DIAGNOSIS — J449 Chronic obstructive pulmonary disease, unspecified: Secondary | ICD-10-CM | POA: Diagnosis not present

## 2017-09-07 DIAGNOSIS — N183 Chronic kidney disease, stage 3 (moderate): Secondary | ICD-10-CM | POA: Diagnosis not present

## 2017-09-07 DIAGNOSIS — I130 Hypertensive heart and chronic kidney disease with heart failure and stage 1 through stage 4 chronic kidney disease, or unspecified chronic kidney disease: Secondary | ICD-10-CM | POA: Diagnosis not present

## 2017-09-07 MED ORDER — AMIODARONE HCL 200 MG PO TABS: 200.0000 mg | ORAL_TABLET | Freq: Every day | ORAL | 3 refills | Status: DC

## 2017-09-07 MED ORDER — POTASSIUM CHLORIDE CRYS ER 20 MEQ PO TBCR: 20.0000 meq | EXTENDED_RELEASE_TABLET | Freq: Every day | ORAL | 3 refills | Status: DC

## 2017-09-07 MED ORDER — LOVASTATIN 20 MG PO TABS: 20.0000 mg | ORAL_TABLET | Freq: Every day | ORAL | 0 refills | Status: DC

## 2017-09-07 MED ORDER — FUROSEMIDE 40 MG PO TABS: 40.0000 mg | ORAL_TABLET | Freq: Every day | ORAL | 3 refills | Status: DC

## 2017-09-11 DIAGNOSIS — J449 Chronic obstructive pulmonary disease, unspecified: Secondary | ICD-10-CM | POA: Diagnosis not present

## 2017-09-11 DIAGNOSIS — R1310 Dysphagia, unspecified: Secondary | ICD-10-CM | POA: Diagnosis not present

## 2017-09-11 DIAGNOSIS — Z4789 Encounter for other orthopedic aftercare: Secondary | ICD-10-CM | POA: Diagnosis not present

## 2017-09-11 DIAGNOSIS — I69331 Monoplegia of upper limb following cerebral infarction affecting right dominant side: Secondary | ICD-10-CM | POA: Diagnosis not present

## 2017-09-11 DIAGNOSIS — I4891 Unspecified atrial fibrillation: Secondary | ICD-10-CM | POA: Diagnosis not present

## 2017-09-11 DIAGNOSIS — N183 Chronic kidney disease, stage 3 (moderate): Secondary | ICD-10-CM | POA: Diagnosis not present

## 2017-09-11 DIAGNOSIS — D631 Anemia in chronic kidney disease: Secondary | ICD-10-CM | POA: Diagnosis not present

## 2017-09-11 DIAGNOSIS — I130 Hypertensive heart and chronic kidney disease with heart failure and stage 1 through stage 4 chronic kidney disease, or unspecified chronic kidney disease: Secondary | ICD-10-CM | POA: Diagnosis not present

## 2017-09-11 DIAGNOSIS — I5033 Acute on chronic diastolic (congestive) heart failure: Secondary | ICD-10-CM | POA: Diagnosis not present

## 2017-09-12 ENCOUNTER — Telehealth (HOSPITAL_COMMUNITY): Payer: Self-pay | Admitting: *Deleted

## 2017-09-12 DIAGNOSIS — I130 Hypertensive heart and chronic kidney disease with heart failure and stage 1 through stage 4 chronic kidney disease, or unspecified chronic kidney disease: Secondary | ICD-10-CM | POA: Diagnosis not present

## 2017-09-12 DIAGNOSIS — I4891 Unspecified atrial fibrillation: Secondary | ICD-10-CM | POA: Diagnosis not present

## 2017-09-12 DIAGNOSIS — R1310 Dysphagia, unspecified: Secondary | ICD-10-CM | POA: Diagnosis not present

## 2017-09-12 DIAGNOSIS — I5033 Acute on chronic diastolic (congestive) heart failure: Secondary | ICD-10-CM | POA: Diagnosis not present

## 2017-09-12 DIAGNOSIS — N183 Chronic kidney disease, stage 3 (moderate): Secondary | ICD-10-CM | POA: Diagnosis not present

## 2017-09-12 DIAGNOSIS — D631 Anemia in chronic kidney disease: Secondary | ICD-10-CM | POA: Diagnosis not present

## 2017-09-12 DIAGNOSIS — Z4789 Encounter for other orthopedic aftercare: Secondary | ICD-10-CM | POA: Diagnosis not present

## 2017-09-12 DIAGNOSIS — I69331 Monoplegia of upper limb following cerebral infarction affecting right dominant side: Secondary | ICD-10-CM | POA: Diagnosis not present

## 2017-09-12 DIAGNOSIS — J449 Chronic obstructive pulmonary disease, unspecified: Secondary | ICD-10-CM | POA: Diagnosis not present

## 2017-09-12 NOTE — Telephone Encounter (Signed)
Pulmonary function test  Order: [id number]  Status:  Edited Result - FINAL Visible to patient:  Yes (MyChart) Dx:  Chronic obstructive pulmonary disease...  Notes recorded by Darron Doom, RN on 09/12/2017 at 8:21 AM EDT Patient's wife called back and she is aware of results with no further questions. ------  Notes recorded by Shirley Muscat, RN on 09/11/2017 at 4:32 PM EDT Left VM  ------  Notes recorded by Larey Dresser, MD on 09/03/2017 at 10:15 PM EDT Suggestive of mild emphysema

## 2017-09-13 ENCOUNTER — Encounter: Payer: Medicare HMO | Admitting: Thoracic Surgery (Cardiothoracic Vascular Surgery)

## 2017-09-13 ENCOUNTER — Telehealth (HOSPITAL_COMMUNITY): Payer: Self-pay | Admitting: *Deleted

## 2017-09-13 DIAGNOSIS — I5033 Acute on chronic diastolic (congestive) heart failure: Secondary | ICD-10-CM | POA: Diagnosis not present

## 2017-09-13 DIAGNOSIS — Z4789 Encounter for other orthopedic aftercare: Secondary | ICD-10-CM | POA: Diagnosis not present

## 2017-09-13 DIAGNOSIS — I69331 Monoplegia of upper limb following cerebral infarction affecting right dominant side: Secondary | ICD-10-CM | POA: Diagnosis not present

## 2017-09-13 DIAGNOSIS — I4891 Unspecified atrial fibrillation: Secondary | ICD-10-CM | POA: Diagnosis not present

## 2017-09-13 DIAGNOSIS — I130 Hypertensive heart and chronic kidney disease with heart failure and stage 1 through stage 4 chronic kidney disease, or unspecified chronic kidney disease: Secondary | ICD-10-CM | POA: Diagnosis not present

## 2017-09-13 DIAGNOSIS — R1310 Dysphagia, unspecified: Secondary | ICD-10-CM | POA: Diagnosis not present

## 2017-09-13 DIAGNOSIS — I5032 Chronic diastolic (congestive) heart failure: Secondary | ICD-10-CM

## 2017-09-13 DIAGNOSIS — J449 Chronic obstructive pulmonary disease, unspecified: Secondary | ICD-10-CM | POA: Diagnosis not present

## 2017-09-13 DIAGNOSIS — D631 Anemia in chronic kidney disease: Secondary | ICD-10-CM | POA: Diagnosis not present

## 2017-09-13 DIAGNOSIS — N183 Chronic kidney disease, stage 3 (moderate): Secondary | ICD-10-CM | POA: Diagnosis not present

## 2017-09-13 MED ORDER — LISINOPRIL 10 MG PO TABS: 10.0000 mg | ORAL_TABLET | Freq: Every day | ORAL | 3 refills | Status: DC

## 2017-09-13 NOTE — Telephone Encounter (Signed)
Please see note below. 

## 2017-09-13 NOTE — Telephone Encounter (Signed)
Advanced Heart Failure Triage Encounter  Patient Name: Brandon [name]  Date of Call: 09/13/17  Problem:  Patient BP has been slowly increasing according to [facility] PT.  On 3/24 it was 158/82 and today it was 180/80.  Patient' wife stated they had stopped lisinopril during his last hospital admission.   Plan:  Will send to Barrington Ellison, PA to review and will call patient back directly with his response.   Darron Doom, RN

## 2017-09-13 NOTE — Telephone Encounter (Signed)
Restart lisinopril at 10 mg daily.  Check BMET 1 week after starting.    Legrand Como [address]" [place], PA-C 09/13/2017 3:56 PM

## 2017-09-13 NOTE — Telephone Encounter (Signed)
Called and spoke with patient's wife, she is agreeable with plan.  Medication and bmet ordered, lab appointment scheduled.

## 2017-09-14 DIAGNOSIS — Z4789 Encounter for other orthopedic aftercare: Secondary | ICD-10-CM | POA: Diagnosis not present

## 2017-09-14 DIAGNOSIS — I130 Hypertensive heart and chronic kidney disease with heart failure and stage 1 through stage 4 chronic kidney disease, or unspecified chronic kidney disease: Secondary | ICD-10-CM | POA: Diagnosis not present

## 2017-09-14 DIAGNOSIS — I69331 Monoplegia of upper limb following cerebral infarction affecting right dominant side: Secondary | ICD-10-CM | POA: Diagnosis not present

## 2017-09-14 DIAGNOSIS — I5033 Acute on chronic diastolic (congestive) heart failure: Secondary | ICD-10-CM | POA: Diagnosis not present

## 2017-09-14 DIAGNOSIS — D631 Anemia in chronic kidney disease: Secondary | ICD-10-CM | POA: Diagnosis not present

## 2017-09-14 DIAGNOSIS — I4891 Unspecified atrial fibrillation: Secondary | ICD-10-CM | POA: Diagnosis not present

## 2017-09-14 DIAGNOSIS — N183 Chronic kidney disease, stage 3 (moderate): Secondary | ICD-10-CM | POA: Diagnosis not present

## 2017-09-14 DIAGNOSIS — R1310 Dysphagia, unspecified: Secondary | ICD-10-CM | POA: Diagnosis not present

## 2017-09-14 DIAGNOSIS — J449 Chronic obstructive pulmonary disease, unspecified: Secondary | ICD-10-CM | POA: Diagnosis not present

## 2017-09-15 ENCOUNTER — Encounter: Payer: Self-pay | Admitting: Thoracic Surgery (Cardiothoracic Vascular Surgery)

## 2017-09-15 ENCOUNTER — Institutional Professional Consult (permissible substitution) (INDEPENDENT_AMBULATORY_CARE_PROVIDER_SITE_OTHER): Payer: Medicare HMO | Admitting: Thoracic Surgery (Cardiothoracic Vascular Surgery)

## 2017-09-15 VITALS — BP 122/63 | HR 77 | Resp 16 | Ht 69.0 in | Wt 225.6 lb

## 2017-09-15 DIAGNOSIS — I2510 Atherosclerotic heart disease of native coronary artery without angina pectoris: Secondary | ICD-10-CM

## 2017-09-15 DIAGNOSIS — I350 Nonrheumatic aortic (valve) stenosis: Secondary | ICD-10-CM | POA: Diagnosis not present

## 2017-09-15 DIAGNOSIS — N183 Chronic kidney disease, stage 3 unspecified: Secondary | ICD-10-CM

## 2017-09-15 DIAGNOSIS — I340 Nonrheumatic mitral (valve) insufficiency: Secondary | ICD-10-CM | POA: Diagnosis not present

## 2017-09-15 DIAGNOSIS — I359 Nonrheumatic aortic valve disorder, unspecified: Secondary | ICD-10-CM

## 2017-09-15 DIAGNOSIS — I480 Paroxysmal atrial fibrillation: Secondary | ICD-10-CM

## 2017-09-15 DIAGNOSIS — I5032 Chronic diastolic (congestive) heart failure: Secondary | ICD-10-CM | POA: Diagnosis not present

## 2017-09-15 DIAGNOSIS — I080 Rheumatic disorders of both mitral and aortic valves: Secondary | ICD-10-CM | POA: Diagnosis not present

## 2017-09-15 DIAGNOSIS — I351 Nonrheumatic aortic (valve) insufficiency: Secondary | ICD-10-CM | POA: Diagnosis not present

## 2017-09-15 NOTE — Patient Instructions (Signed)
Continue all previous medications without any changes at this time  

## 2017-09-15 NOTE — Progress Notes (Signed)
[address]       [place] [address]             [facility] REPORT  Referring Provider is Larey Dresser, MD  Primary Cardiologist is Dorris Carnes, MD PCP is Maurice Small, MD  Chief Complaint  Patient presents with  . Coronary Artery Disease    Surgical eval, Cardiac Cath and TEE 08/30/17, ECHO , PFT's 09/01/17  . Aortic Insuffiency    and ATRIAL STENOSIS  . Mitral Regurgitation  . Atrial Fibrillation    PAROXSYSMAL    HPI:  Patient is a 71 year old moderately obese white male with history of cerebrovascular disease status post right hemispheric stroke in 2004, hypertension, aortic stenosis with aortic insufficiency, mitral regurgitation, and recurrent paroxysmal atrial fibrillation who has been referred for surgical consultation to discuss treatment options for management of his valvular heart disease and atrial fibrillation. The patient suffered a stroke in 2004 involving the right middle cerebral artery that was treated with catheter directed thrombo-lysis with remarkable success. He initially presented with severe left-sided paralysis, but he recovered from the stroke quite well with some mild residual problems with short-term memory loss.  At the time he was told that there were signs to suggest that he may have had a heart attack in the past and he was referred for cardiology consultation. He has been followed intermittently ever since by Dr. Harrington Challenger. He was noted to have a heart murmur on exam and echocardiograms revealed the presence of aortic stenosis with aortic insufficiency, mitral regurgitation, and mild left ventricular systolic dysfunction. He had remained clinically stable until last spring when he was scheduled for left shoulder surgery. Preoperative cardiac clearance was requested and both transthoracic and transesophageal echocardiograms were performed.  Echocardiograms revealed normal left ventricular  systolic function with mild to moderate aortic stenosis, moderate aortic insufficiency, and moderate mitral regurgitation. The patient underwent uncomplicated left shoulder reconstruction. During his immediate postoperative recovery the patient states that he experienced a 30 minute period of tachypalpitations associated with severe shortness of breath and chest discomfort. Symptoms resolved and did not recur. Over the next 2 months the patient did fairly well and went to physical rehabilitation for treatment of his shoulder surgery.  During that period of time he was noted to experience exertional shortness of breath, and he was admitted acutely in August with severe resting shortness breath and chest pressure associated with atrial fibrillation and rapid ventricular response. He developed acute hypoxemic respiratory failure requiring intubation for mechanical ventilatory support. He became hypotensive and required pressors for a period of time. He developed acute renal failure and pneumonia. He had a prolonged hospitalization but eventually recovered and was extubated. Because of his severe deconditioning he was discharged to the inpatient rehabilitation service for 2 weeks. He continues to gradually improve and was seen in follow-up by Dr. Aundra Dubin in the advanced heart failure clinic. He has been maintaining sinus rhythm on amiodarone and is anticoagulated using Eliquis.  Transesophageal echocardiogram and diagnostic cardiac catheterization were performed 08/30/2017. TEE revealed mild to moderate aortic stenosis with moderate to severe aortic insufficiency and severe mitral regurgitation.  There was mild left ventricular chamber enlargement with ejection fraction estimated 50%.  Diagnostic cardiac catheterization revealed a normal as coronary circulation with chronic occlusion of the right coronary artery and posterior descending coronary artery with left-to-right collaterals. There was no significant disease in  the left coronary system. Mean transvalvular  gradient across the aortic valve was 23 mmHg. Pulmonary artery pressures were very mildly elevated. Patient was referred for surgical consultation.  The patient is married and lives with his wife in [place]. 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. He recovered from his stroke remarkably well and lives a fairly normal physical lifestyle up until recently. He states that he was doing well with his physical therapy and recovery from his shoulder surgery although he developed progressive shortness of breath followed by the acute onset of severe shortness of breath and chest discomfort with acute hypoxemic respiratory failure in the setting of atrial fibrillation with rapid ventricular response. Since his prolonged hospitalization the patient has been now at home for approximately one month. He is making good progress but states that he still not back to his baseline. His gait remains slightly unstable because of weakness and problems with balance. His breathing is stable although he does get short of breath with more strenuous exertion. He has not had resting shortness of breath, PND, orthopnea, palpitations, or lower extremity edema since hospital discharge. Appetite is stable. Strength is slowly improving.  Early after his hospitalization he had problems with dysphagia but this has resolved. During his acute illness he had problems with memory loss and delivery and but this has resolved.  Past Medical History:  Diagnosis Date  . Anemia 07/13/2017  . Anxiety   . Aortic insufficiency   . Aortic stenosis, moderate 07/13/2017  . Arthritis    back   . Carotid stenosis    Right carotid stent (widely patent) 40 - 59% left plaque 11/13  . Depression   . Dyslipidemia   . GERD (gastroesophageal reflux disease)   . Heart murmur   . Hemiplegia affecting unspecified side, late  effect of cerebrovascular disease    resolved- from L side   . Hypertension   . Jaundice    resolved following ERCP & Cholecystectomy  . Mitral valve insufficiency and aortic valve insufficiency   . Pre-diabetes    per spouse  . Sleep apnea    does not wear CPAP  . Sleep concern    resulted in surgery- after + sleep test. Pt. doesn't have a problem any longer.   . Stroke ([place]) 03/11/2003   stent placed on the 31, 3, 2004, L side   . Wears glasses   . Wears hearing aid in both ears   . Wears partial dentures     Past Surgical History:  Procedure Laterality Date  . BACK SURGERY     lumbar back  . CHOLECYSTECTOMY    . ERCP N/A 05/31/2013   Procedure: ENDOSCOPIC RETROGRADE CHOLANGIOPANCREATOGRAPHY (ERCP);  Surgeon: Ladene Artist, MD;  Location: [facility];  Service: Endoscopy;  Laterality: N/A;  . FOOT SURGERY     right  . LAPAROSCOPIC CHOLECYSTECTOMY SINGLE PORT N/A 06/01/2013   Procedure: LAPAROSCOPIC CHOLECYSTECTOMY SINGLE PORT;  Surgeon: Adin Hector, MD;  Location: [facility];  Service: General;  Laterality: N/A;  . POLYPECTOMY    . RIGHT/LEFT HEART CATH AND CORONARY ANGIOGRAPHY N/A 08/30/2017   Procedure: RIGHT/LEFT HEART CATH AND CORONARY ANGIOGRAPHY;  Surgeon: Larey Dresser, MD;  Location: [facility];  Service: Cardiovascular;  Laterality: N/A;  . SHOULDER ARTHROSCOPY WITH ROTATOR CUFF REPAIR AND SUBACROMIAL DECOMPRESSION Left 05/18/2017  . SHOULDER ARTHROSCOPY WITH ROTATOR CUFF REPAIR AND SUBACROMIAL DECOMPRESSION Left 05/18/2017   Procedure: SHOULDER ARTHROSCOPY WITH ROTATOR CUFF REPAIR  AND SUBACROMIAL DECOMPRESSION;  Surgeon: Tania Ade, MD;  Location: [facility];  Service: Orthopedics;  Laterality: Left;  LEFT SHOULDER ARTHROSCOPY WITH ROTATOR CUFF REPAIR AND SUBACROMIAL DECOMPRESSION  . TEE WITHOUT CARDIOVERSION N/A 05/09/2017   Procedure: TRANSESOPHAGEAL ECHOCARDIOGRAM (TEE);  Surgeon: Skeet Latch, MD;  Location: [facility];  Service: Cardiovascular;   Laterality: N/A;  . TEE WITHOUT CARDIOVERSION N/A 08/30/2017   Procedure: TRANSESOPHAGEAL ECHOCARDIOGRAM (TEE);  Surgeon: Larey Dresser, MD;  Location: [facility];  Service: Cardiovascular;  Laterality: N/A;  . TONSILLECTOMY      Family History  Problem Relation Age of Onset  . Stroke Father        No details  . Angina Mother     Social History   Social History  . Marital status: Married    Spouse name: N/A  . Number of children: 3  . Years of education: N/A   Occupational History  .  Retired   Social History Main Topics  . Smoking status: Current Every Day Smoker    Packs/day: 0.50    Years: 57.00    Types: Cigarettes  . Smokeless tobacco: Never Used  . Alcohol use 1.2 - 1.8 oz/week    2 - 3 Glasses of wine per week     Comment: moderate wine  . Drug use: No  . Sexual activity: Yes   Other Topics Concern  . Not on file   Social History Narrative   Two living children.  Lives with wife.      Current Outpatient Prescriptions  Medication Sig Dispense Refill  . amiodarone (PACERONE) 200 MG tablet Take 1 tablet (200 mg total) by mouth daily. 90 tablet 3  . apixaban (ELIQUIS) 5 MG TABS tablet Take 1 tablet (5 mg total) by mouth 2 (two) times daily. 60 tablet 12  . calcium carbonate (TUMS - DOSED IN MG ELEMENTAL CALCIUM) 500 MG chewable tablet Chew 1 tablet by mouth daily as needed for indigestion or heartburn.    . citalopram (CELEXA) 20 MG tablet Take 1 tablet (20 mg total) by mouth at bedtime. 30 tablet 5  . docusate sodium (COLACE) 100 MG capsule Take 1 capsule (100 mg total) by mouth 3 (three) times daily as needed. (Patient taking differently: Take 100 mg by mouth 3 (three) times daily as needed (for constipation). ) 20 capsule 0  . ferrous sulfate 325 (65 FE) MG tablet Take 1 tablet (325 mg total) by mouth daily with breakfast. 30 tablet 3  . furosemide (LASIX) 40 MG tablet Take 1 tablet (40 mg total) by mouth daily. 90 tablet 3  . lisinopril  (PRINIVIL,ZESTRIL) 10 MG tablet Take 1 tablet (10 mg total) by mouth daily. 90 tablet 3  . LORazepam (ATIVAN) 0.5 MG tablet Take 1 tablet (0.5 mg total) by mouth every 6 (six) hours as needed for anxiety (aggitation). 20 tablet 0  . oxyCODONE-acetaminophen (ROXICET) 5-325 MG tablet Take 1-2 tablets by mouth every 4 (four) hours as needed for severe pain. (Patient taking differently: Take 1-2 tablets by mouth every 4 (four) hours as needed for severe pain. For pain.) 30 tablet 0  . pantoprazole (PROTONIX) 20 MG tablet Take 1 tablet (20 mg total) by mouth daily. 30 tablet 5  . potassium chloride SA (K-DUR,KLOR-CON) 20 MEQ tablet Take 1 tablet (20 mEq total) by mouth daily. 90 tablet 3  . Pseudoeph-Doxylamine-DM-APAP (NYQUIL PO) Take by mouth at bedtime as needed. Rarely uses    . traZODone (DESYREL) 50 MG tablet Take 1 tablet (  50 mg total) by mouth at bedtime. 30 tablet 5  . lovastatin (MEVACOR) 20 MG tablet Take 1 tablet (20 mg total) by mouth at bedtime. 90 tablet 0  . nitroGLYCERIN (NITROSTAT) 0.4 MG SL tablet Place 1 tablet (0.4 mg total) under the tongue every 5 (five) minutes x 3 doses as needed for chest pain. 30 tablet 12  . ondansetron (ZOFRAN) 4 MG tablet Take 4 mg by mouth every 8 (eight) hours as needed for nausea or vomiting.     No current facility-administered medications for this visit.     Allergies  Allergen Reactions  . Bee Venom Anaphylaxis and Swelling      Review of Systems:   General:  normal appetite, decreased energy, no weight gain, + weight loss, NO fever  Cardiac:  no chest pain with exertion, no chest pain at rest, + SOB with exertion, no resting SOB, no PND, no orthopnea, no palpitations, + arrhythmia, + atrial fibrillation, + LE edema, no dizzy spells, no syncope  Respiratory:  + shortness of breath, no home oxygen, + productive cough, no dry cough, no bronchitis, + wheezing, no hemoptysis, no asthma, no pain with inspiration or cough, + sleep apnea, no CPAP at  night  GI:   resolved difficulty swallowing, + reflux, no frequent heartburn, no hiatal hernia, no abdominal pain, no constipation, no diarrhea, no hematochezia, no hematemesis, no melena  GU:   no dysuria,  no frequency, no urinary tract infection, no hematuria, no enlarged prostate, no kidney stones, + mild chronic kidney disease  Vascular:  no pain suggestive of claudication, no pain in feet, no leg cramps, no varicose veins, no DVT, no non-healing foot ulcer  Neuro:   + stroke, no TIA's, no seizures, no headaches, no temporary blindness one eye,  no slurred speech, no peripheral neuropathy, no chronic pain, mild instability of gait, + memory/cognitive dysfunction  Musculoskeletal: no arthritis, no joint swelling, no myalgias, + difficulty walking, reduced mobility   Skin:   no rash, no itching, no skin infections, no pressure sores or ulcerations  Psych:   + anxiety, + depression, no nervousness, no unusual recent stress  Eyes:   no blurry vision, + floaters, no recent vision changes, + wears glasses or contacts  ENT:   no hearing loss, no loose or painful teeth, partial dentures, last saw dentist within 6 months  Hematologic:  + easy bruising, no abnormal bleeding, no clotting disorder, no frequent epistaxis  Endocrine:  no diabetes, does not check CBG's at home     Physical Exam:   BP 122/63 (BP Location: Right Arm, Patient Position: Sitting, Cuff Size: Large)   Pulse 77   Resp 16   Ht 5\' 9"  (1.753 m)   Wt 225 lb 9.6 oz (102.3 kg)   SpO2 97% Comment: RA  BMI 33.32 kg/m   General:  Moderately obese  well-appearing  HEENT:  Unremarkable   Neck:   no JVD, no bruits, no adenopathy   Chest:   clear to auscultation, symmetrical breath sounds, no wheezes, no rhonchi   CV:   RRR, grade III/VI systolic murmur   Abdomen:  soft, non-tender, no masses   Extremities:  warm, well-perfused, pulses diminished, no LE edema  Rectal/GU  Deferred  Neuro:   Grossly non-focal and symmetrical  throughout  Skin:   Clean and dry, no rashes, no breakdown   Diagnostic Tests:  Transthoracic Echocardiography  Patient:    Marrion, [place] MR #:  [id number] Study Date: 07/14/2017 Gender:     M Age:        71 Height:     182.9 cm Weight:     112.5 kg BSA:        2.43 m^2 Pt. Status: Room:       2H23C   Mechele Collin    Barrett, Evelene Croon  ATTENDING    Veryl Speak [id number]  PERFORMING   [facility], Inpatient  ADMITTING    Janace Litten  SONOGRAPHER  Lonny Prude [place]  cc:  ------------------------------------------------------------------- LV EF: 55% -   60%  ------------------------------------------------------------------- History:   PMH:  Aortic stenosis.  Risk factors:  Hypertension. Dyslipidemia.  ------------------------------------------------------------------- Study Conclusions  - Left ventricle: The cavity size was moderately dilated. There was   moderate concentric hypertrophy. Systolic function was normal.   The estimated ejection fraction was in the range of 55% to 60%.   There is akinesis of the basalinferior myocardium. Features are   consistent with a pseudonormal left ventricular filling pattern,   with concomitant abnormal relaxation and increased filling   pressure (grade 2 diastolic dysfunction). Doppler parameters are   consistent with high ventricular filling pressure. - Aortic valve: Cusp separation was reduced. The peak aortic   velocity is elevated at 4.61m/sec but this is most likely high   due to increased flow across the AV from moderate AR. The mean   gradient of 63mmHg and VTI ratio of the LVOT/AV is most   consistent with moderate stenosis. There was moderate   regurgitation. Mean gradient (S): 33 mm Hg. VTI ratio of LVOT to   aortic valve: 0.27. Valve area (VTI): 0.85 cm^2. Valve area   (Vmax): 0.9 cm^2. Valve area (Vmean): 1.11 cm^2. Regurgitation   pressure half-time: 379 ms. - Mitral valve:  Calcified annulus. Visually there appears to be   moderate to severe regurgitation. The PISA calculation does   appear accurate (mild MR by PISA). Valve area by pressure   half-time: 2.22 cm^2. Valve area by continuity equation (using   LVOT flow): 1.91 cm^2. - Left atrium: The atrium was moderately dilated. - Pulmonic valve: There was trivial regurgitation. - Pulmonary arteries: Systolic pressure could not be accurately   estimated.  Impressions:  - Compared to echo 04/2017, the mean AV gradient and peak AV   velocity are essentially unchanged.  Recommendations:  Consider TEE to evaluate MR further. Visually appears to be at least moderate to severe MR.  ------------------------------------------------------------------- Study data:  The previous study was not available, so comparison was made to the report of 04/27/2017.  Study status:  Routine. Procedure:  Transthoracic echocardiography. Image quality was adequate. The study was technically difficult, as a result of poor patient compliance and restricted patient mobility. Intravenous contrast (Definity) was administered.  Study completion:  There were no complications.          Transthoracic echocardiography. M-mode, complete 2D, spectral Doppler, and color Doppler. Birthdate:  Patient birthdate: Mar 10, 1946.  Age:  Patient is 71 yr old.  Sex:  Gender: male.    BMI: 33.6 kg/m^2.  Blood pressure: 80/50  Patient status:  Inpatient.  Study date:  Study date: 07/14/2017. Study time: 04:23 PM.  Location:  Bedside.  -------------------------------------------------------------------  ------------------------------------------------------------------- Left ventricle:  The cavity size was moderately dilated. There was moderate concentric hypertrophy. Systolic function was normal. The estimated ejection fraction was in the range of 55% to 60%. Regional wall motion abnormalities:   There is  akinesis of the basalinferior  myocardium. Features are consistent with a pseudonormal left ventricular filling pattern, with concomitant abnormal relaxation and increased filling pressure (grade 2 diastolic dysfunction). Doppler parameters are consistent with high ventricular filling pressure.  ------------------------------------------------------------------- Aortic valve:   Trileaflet; moderately thickened, moderately calcified leaflets. Cusp separation was reduced.  Doppler:  The peak aortic velocity is elevated at 4.58m/sec but this is most likely high due to increased flow across the AV from moderate AR. The mean gradient of 16mmHg and VTI ratio of the LVOT/AV is most consistent with moderate stenosis. There was moderate regurgitation.    VTI ratio of LVOT to aortic valve: 0.27. Valve area (VTI): 0.85 cm^2. Indexed valve area (VTI): 0.35 cm^2/m^2. Peak velocity ratio of LVOT to aortic valve: 0.29. Valve area (Vmax): 0.9 cm^2. Indexed valve area (Vmax): 0.37 cm^2/m^2. Mean velocity ratio of LVOT to aortic valve: 0.35. Valve area (Vmean): 1.11 cm^2. Indexed valve area (Vmean): 0.46 cm^2/m^2.    Mean gradient (S): 33 mm Hg. Peak gradient (S): 72 mm Hg.  ------------------------------------------------------------------- Aorta:  Aortic root: The aortic root was normal in size.  ------------------------------------------------------------------- Mitral valve:   Calcified annulus. Mobility was not restricted. Doppler:  Transvalvular velocity was within the normal range. There was no evidence for stenosis. Visually there appears to be moderate to severe regurgitation. The PISA calculation does appear accurate (mild MR by PISA).    Valve area by pressure half-time: 2.22 cm^2. Indexed valve area by pressure half-time: 0.92 cm^2/m^2. Valve area by continuity equation (using LVOT flow): 1.91 cm^2. Indexed valve area by continuity equation (using LVOT flow): 0.79 cm^2/m^2. Mean gradient (D): 4 mm Hg. Peak gradient  (D): 7 mm Hg.  ------------------------------------------------------------------- Left atrium:  The atrium was moderately dilated.  ------------------------------------------------------------------- Right ventricle:  The cavity size was normal. Wall thickness was normal. Systolic function was normal.  ------------------------------------------------------------------- Pulmonic valve:    Structurally normal valve.   Cusp separation was normal.  Doppler:  Transvalvular velocity was within the normal range. There was no evidence for stenosis. There was trivial regurgitation.  ------------------------------------------------------------------- Tricuspid valve:   Structurally normal valve.    Doppler: Transvalvular velocity was within the normal range. There was no regurgitation.  ------------------------------------------------------------------- Pulmonary artery:   The main pulmonary artery was normal-sized. Systolic pressure could not be accurately estimated.  ------------------------------------------------------------------- Right atrium:  The atrium was normal in size.  ------------------------------------------------------------------- Pericardium:  There was no pericardial effusion.  ------------------------------------------------------------------- Systemic veins: Inferior vena cava: The vessel was dilated. The respirophasic diameter changes were blunted (< 50%), consistent with elevated central venous pressure.  ------------------------------------------------------------------- Measurements   Left ventricle                           Value          Reference  LV ID, ED, PLAX chordal          (H)     55    mm       43 - 52  LV ID, ES, PLAX chordal          (H)     44    mm       23 - 38  LV fx shortening, PLAX chordal   (L)     20    %        >=29  LV PW thickness, ED  13    mm       ----------  IVS/LV PW ratio, ED                       1.08           <=1.3  Stroke volume, 2D                        85    ml       ----------  Stroke volume/bsa, 2D                    35    ml/m^2   ----------  LV e&', lateral                           6.3   cm/s     ----------  LV E/e&', lateral                         20.63          ----------  LV e&', medial                            5.44  cm/s     ----------  LV E/e&', medial                          23.9           ----------  LV e&', average                           5.87  cm/s     ----------  LV E/e&', average                         22.15          ----------    Ventricular septum                       Value          Reference  IVS thickness, ED                        14    mm       ----------    LVOT                                     Value          Reference  LVOT ID, S                               20    mm       ----------  LVOT area                                3.14  cm^2     ----------  LVOT peak velocity, S                    122   cm/s     ----------  LVOT mean velocity, S                    92.1  cm/s     ----------  LVOT VTI, S                              27.2  cm       ----------  LVOT peak gradient, S                    6     mm Hg    ----------    Aortic valve                             Value          Reference  Aortic valve peak velocity, S            425   cm/s     ----------  Aortic valve mean velocity, S            261   cm/s     ----------  Aortic valve VTI, S                      100   cm       ----------  Aortic mean gradient, S                  33    mm Hg    ----------  Aortic peak gradient, S                  72    mm Hg    ----------  VTI ratio, LVOT/AV                       0.27           ----------  Aortic valve area, VTI                   0.85  cm^2     ----------  Aortic valve area/bsa, VTI               0.35  cm^2/m^2 ----------  Velocity ratio, peak, LVOT/AV            0.29           ----------  Aortic valve area, peak velocity         0.9   cm^2      ----------  Aortic valve area/bsa, peak              0.37  cm^2/m^2 ----------  velocity  Velocity ratio, mean, LVOT/AV            0.35           ----------  Aortic valve area, mean velocity         1.11  cm^2     ----------  Aortic valve area/bsa, mean              0.46  cm^2/m^2 ----------  velocity  Aortic regurg pressure half-time         379   ms       ----------    Aorta  Value          Reference  Aortic root ID, ED                       32    mm       ----------    Left atrium                              Value          Reference  LA ID, A-P, ES                           49    mm       ----------  LA ID/bsa, A-P                           2.02  cm/m^2   <=2.2  LA volume, S                             73.3  ml       ----------  LA volume/bsa, S                         30.2  ml/m^2   ----------  LA volume, ES, 1-p A4C                   64.1  ml       ----------  LA volume/bsa, ES, 1-p A4C               26.4  ml/m^2   ----------  LA volume, ES, 1-p A2C                   81.7  ml       ----------  LA volume/bsa, ES, 1-p A2C               33.7  ml/m^2   ----------    Mitral valve                             Value          Reference  Mitral E-wave peak velocity              130   cm/s     ----------  Mitral A-wave peak velocity              78.8  cm/s     ----------  Mitral mean velocity, D                  84    cm/s     ----------  Mitral deceleration time         (H)     359   ms       150 - 230  Mitral pressure half-time                99    ms       ----------  Mitral mean gradient, D                  4     mm Hg    ----------  Mitral peak gradient, D  7     mm Hg    ----------  Mitral E/A ratio, peak                   1.6            ----------  Mitral valve area, PHT, DP               2.22  cm^2     ----------  Mitral valve area/bsa, PHT, DP           0.92  cm^2/m^2 ----------  Mitral valve area, LVOT                  1.91   cm^2     ----------  continuity  Mitral valve area/bsa, LVOT              0.79  cm^2/m^2 ----------  continuity  Mitral annulus VTI, D                    44.7  cm       ----------  Mitral maximal regurg velocity,          425   cm/s     ----------  PISA  Mitral regurg VTI, PISA                  135   cm       ----------  Mitral ERO, PISA                         0.14  cm^2     ----------  Mitral regurg volume, PISA               19    ml       ----------    Right atrium                             Value          Reference  RA ID, S-I, ES, A4C              (H)     51.6  mm       34 - 49  RA area, ES, A4C                         18.6  cm^2     8.3 - 19.5  RA volume, ES, A/L                       52.9  ml       ----------  RA volume/bsa, ES, A/L                   21.8  ml/m^2   ----------    Systemic veins                           Value          Reference  Estimated CVP                            8     mm Hg    ----------    Right ventricle  Value          Reference  RV ID, minor axis, ED, A4C base          37    mm       ----------  TAPSE                                    22.5  mm       ----------  RV s&', lateral, S                        9.28  cm/s     ----------  Legend: (L)  and  (H)  mark values outside specified reference range.  ------------------------------------------------------------------- Prepared and Electronically Authenticated by  Fransico Him, MD 2018-08-03T18:18:22     Transesophageal Echocardiography  (Report amended )  Patient:    [place], Skalicky MR #:       [id number] Study Date: 08/30/2017 Gender:     M Age:        51 Height:     175.3 cm Weight:     104.8 kg BSA:        2.3 m^2 Pt. Status: Room:   ADMITTING    Loralie Champagne, M.D.  ATTENDING    Loralie Champagne, M.D.  ORDERING     Loralie Champagne, M.D.  PERFORMING   Loralie Champagne, M.D.  REFERRING    Loralie Champagne, M.D.  SONOGRAPHER  Tresa Res,  RDCS  cc:  -------------------------------------------------------------------  ------------------------------------------------------------------- Indications:      CHF - 428.0.  ------------------------------------------------------------------- Study Conclusions  - Left ventricle: Mildly dilated LV with basal to mid inferolateral   hypokinesis and basal inferior akinesis. EF 50%. - Aortic valve: The aortic valve was moderately calcified. Mean   gradient 22 mmHg with AVA 1.46 cm^2 suggests moderate stenosis.   Moderate to severe aortic insufficiency with jet width/LVOT width   0.60. Trileaflet. - Aorta: Normal caliber aorta with mild plaque in the descending   thoracic aorta. - Mitral valve: The posterior leaflet of the mitral valve is   restricted, there is mitral regurgitation that appears to be   severe (PISA ERO 0.4 cm^2 and MR regurgitant volume 80 cc). There   was no systolic flow reversal in the pulmonary veins. - Left atrium: The atrium was moderately dilated. No evidence of   thrombus in the atrial cavity or appendage. - Right ventricle: The cavity size was normal. Systolic function   was normal. - Right atrium: No evidence of thrombus in the atrial cavity or   appendage. - Atrial septum: No defect or patent foramen ovale was identified.   Echo contrast study showed no right-to-left atrial level shunt,   at baseline or with provocation.  Impressions:  - 1. LV mildly dilated with EF 50% with wall motion abnormalities   noted above.   2. Suspect severe mitral regurgitation. There is restriction of   the posterior leaflet, possible infarct-related MR given wall   motion abnormalities.   3. Moderate aortic stenosis with moderate to severe aortic   insufficiency.  ------------------------------------------------------------------- Study data:   Study status:  Routine.  Consent:  The risks, benefits, and alternatives to the procedure were explained to  the patient and informed consent was obtained.  Procedure:  Initial setup. The patient was brought to the laboratory. Surface ECG leads were monitored. Sedation. Conscious sedation was administered. Transesophageal echocardiography.  Topical anesthesia was obtained using viscous lidocaine. A transesophageal probe was inserted by the attending cardiologist. Image quality was adequate.  Study completion:  The patient tolerated the procedure well. There were no complications.  Administered medications:   Fentanyl, 45mcg, IV.  Midazolam, 4mg , IV.          Diagnostic transesophageal echocardiography.  2D and color Doppler.  Birthdate:  Patient birthdate: 1946/11/24.  Age:  Patient is 71 yr old.  Sex:  Gender: male.    BMI: 34.1 kg/m^2.  Blood pressure:     134/55  Patient status:  Outpatient.  Study date:  Study date: 08/30/2017. Study time: 10:22 AM.  Location:  Endoscopy.  -------------------------------------------------------------------  ------------------------------------------------------------------- Left ventricle:  Mildly dilated LV with basal to mid inferolateral hypokinesis and basal inferior akinesis. EF 50%.  Wall thickness was normal.  ------------------------------------------------------------------- Aortic valve:  The aortic valve was moderately calcified. Mean gradient 22 mmHg with AVA 1.46 cm^2 suggests moderate stenosis. Moderate to severe aortic insufficiency with jet width/LVOT width 0.60.  Trileaflet.  Doppler:     VTI ratio of LVOT to aortic valve: 0.35. Valve area (VTI): 1.46 cm^2. Indexed valve area (VTI): 0.64 cm^2/m^2. Peak velocity ratio of LVOT to aortic valve: 0.41. Valve area (Vmax): 1.68 cm^2. Indexed valve area (Vmax): 0.73 cm^2/m^2. Mean velocity ratio of LVOT to aortic valve: 0.41. Valve area (Vmean): 1.7 cm^2. Indexed valve area (Vmean): 0.74 cm^2/m^2. Mean gradient (S): 22 mm Hg. Peak gradient (S): 44 mm  Hg.  ------------------------------------------------------------------- Aorta:  Normal caliber aorta with mild plaque in the descending thoracic aorta.  ------------------------------------------------------------------- Mitral valve:  The posterior leaflet of the mitral valve is restricted, there is mitral regurgitation that appears to be severe (PISA ERO 0.4 cm^2 and MR regurgitant volume 80 cc). There was no systolic flow reversal in the pulmonary veins.  Doppler:   There was no evidence for stenosis.  ------------------------------------------------------------------- Left atrium:  The atrium was moderately dilated.  No evidence of thrombus in the atrial cavity or appendage.  ------------------------------------------------------------------- Atrial septum:  No defect or patent foramen ovale was identified. Echo contrast study showed no right-to-left atrial level shunt, at baseline or with provocation.  ------------------------------------------------------------------- Right ventricle:  The cavity size was normal. Systolic function was normal.  ------------------------------------------------------------------- Pulmonic valve:    Doppler:  There was trivial regurgitation.  ------------------------------------------------------------------- Tricuspid valve:   Doppler:  There was trivial regurgitation.   ------------------------------------------------------------------- Right atrium:  The atrium was normal in size.  No evidence of thrombus in the atrial cavity or appendage.  ------------------------------------------------------------------- Pericardium:  There was no pericardial effusion.   ------------------------------------------------------------------- Post procedure conclusions Ascending Aorta:  - Normal caliber aorta with mild plaque in the descending thoracic    aorta.  ------------------------------------------------------------------- Measurements   Left ventricle                           Value  Stroke volume, 2D                        83    ml  Stroke volume/bsa, 2D                    36    ml/m^2    LVOT  Value  LVOT ID, S                               23    mm  LVOT area                                4.15  cm^2  LVOT peak velocity, S                    135   cm/s  LVOT mean velocity, S                    83.5  cm/s  LVOT VTI, S                              19.9  cm  LVOT peak gradient, S                    7     mm Hg    Aortic valve                             Value  Aortic valve peak velocity, S            333   cm/s  Aortic valve mean velocity, S            204   cm/s  Aortic valve VTI, S                      56.4  cm  Aortic mean gradient, S                  21    mm Hg  Aortic peak gradient, S                  44    mm Hg  VTI ratio, LVOT/AV                       0.35  Aortic valve area, VTI                   1.46  cm^2  Aortic valve area/bsa, VTI               0.64  cm^2/m^2  Velocity ratio, peak, LVOT/AV            0.41  Aortic valve area, peak velocity         1.68  cm^2  Aortic valve area/bsa, peak velocity     0.73  cm^2/m^2  Velocity ratio, mean, LVOT/AV            0.41  Aortic valve area, mean velocity         1.7   cm^2  Aortic valve area/bsa, mean velocity     0.74  cm^2/m^2    Mitral valve                             Value  Mitral maximal regurg velocity, PISA     670   cm/s  Mitral regurg VTI, PISA  201   cm  Mitral ERO, PISA                         0.4   cm^2  Mitral regurg volume, PISA               80    ml  Legend: (L)  and  (H)  mark values outside specified reference range.  ------------------------------------------------------------------- Clayborne Dana, M.D. 2018-09-30T13:26:42    RIGHT/LEFT HEART CATH AND CORONARY ANGIOGRAPHY   Conclusion   1. Normal right and left heart filling pressures and preserved cardiac output.  2. Moderate aortic stenosis by mean gradient.  Aortic root shot for aortic insufficiency was not done to save contrast, had TEE today earlier.  3. 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  The larger vessel supplying the PDA was totally occluded in the mid vessel with collaterals from the left system.  The small vessel supplying RV/PLV territory was patent.  The left coronary system did not have significant disease.   I am going to refer Mr Offord for surgical evaluation.  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  Would consider aortic and mitral valve repairs along with Maze and possible bypass to PDA.    Procedural Details/Technique   Technical Details Procedure: Right Heart Cath, Left Heart Cath, Selective Coronary Angiography  Indication: Valvular heart disease, concern for CAD.   Procedural Details: The right groin and right radial area were prepped, draped, and anesthetized with 1% lidocaine. Using the modified Seldinger technique a 7 French sheath was placed in the right femoral vein. A Swan-Ganz catheter was used for the right heart catheterization. Standard protocol was followed for recording of right heart pressures and sampling of oxygen saturations. Fick cardiac output was calculated. The right radial artery was entered using modified Seldinger technique and a 34F sheath was placed. Standard Judkins catheters were used for selective coronary angiography. There were no immediate procedural complications. The patient was transferred to the post catheterization recovery area for further monitoring.   Estimated blood loss <50 mL.  During this procedure the patient was administered the following to achieve and maintain moderate  conscious sedation: Versed 1 mg, Fentanyl 25 mcg, while the patient's heart rate, blood pressure, and oxygen saturation were continuously monitored. The period of conscious sedation was 43 minutes, of which I was present face-to-face 100% of this time.    Coronary Findings   Dominance: Right  Left Main  No significant disease.  Left Anterior Descending  Luminal irregularities.  Ramus Intermedius  Large vessel, luminal irregularities.  Left Circumflex  Luminal irregularities.  Right Coronary Artery  The RCA circulation was anomalous. 2 arteries originated from the right cusp. One covered primarily the PLV territory and could be easily engaged. This artery had minimal luminal irregularities. A 2nd larger artery covered the PDA territory primarily. This artery was totally occluded in the mid-portion and could not be selectively engaged. There are collaterals to the large PDA from the left system.  Right Heart   Right Heart Pressures RHC Procedural Findings: Hemodynamics (mmHg) RA mean 3 RV 26/3 PA 28/13, mean 19 PCWP mean 8 LV 155/18 AO 132/59  Aortic valve mean gradient 23 mmHg  Oxygen saturations: PA 60% AO 98%  Cardiac Output (Fick) 5.9  Cardiac Index (Fick) 2.68    Implants  No implant documentation for this case.  MERGE Images   Show images for CARDIAC CATHETERIZATION   Link to Procedure Log   Procedure Log    Hemo Data    Most Recent Value  Fick Cardiac Output 5.9 L/min  Fick Cardiac Output Index 2.68 (L/min)/BSA  Aortic Mean Gradient 23.2 mmHg  Aortic Peak Gradient 19 mmHg  Aortic Valve Area 1.30  Aortic Value Area Index 0.59 cm2/BSA  RA A Wave 6 mmHg  RA V Wave 3 mmHg  RA Mean 3 mmHg  RV Systolic Pressure 26 mmHg  RV Diastolic Pressure 1 mmHg  RV EDP 3 mmHg  PA Systolic Pressure 28 mmHg  PA Diastolic Pressure 13 mmHg  PA Mean 19 mmHg  PW A Wave 10 mmHg  PW V Wave 11 mmHg  PW Mean 8 mmHg  AO Systolic Pressure 858 mmHg  AO Diastolic Pressure  59 mmHg  AO Mean 86 mmHg  LV Systolic Pressure 850 mmHg  LV Diastolic Pressure 13 mmHg  LV EDP 18 mmHg  Arterial Occlusion Pressure Extended Systolic Pressure 277 mmHg  Arterial Occlusion Pressure Extended Diastolic Pressure 59 mmHg  Arterial Occlusion Pressure Extended Mean Pressure 88 mmHg  Left Ventricular Apex Extended Systolic Pressure 412 mmHg  Left Ventricular Apex Extended Diastolic Pressure 10 mmHg  Left Ventricular Apex Extended EDP Pressure 20 mmHg  QP/QS 1  TPVR Index 7.09 HRUI  TSVR Index 32.08 HRUI  PVR SVR Ratio 0.13  TPVR/TSVR Ratio 0.22  Order-Level Documents:   There are no order-level documents.  Encounter-Level Documents - 08/30/2017:   Scan on 08/31/2017 3:56 PM by Default, Provider, MD  Scan on 08/31/2017 3:52 PM by Default, Provider, MD  Scan on 08/31/2017 3:40 PM by Default, Provider, MD  Document on 08/30/2017 4:15 PM by Willette Alma, RN : After Visit Summary  Document on 08/30/2017 4:15 PM by Willette Alma, RN : IP After Visit Summary  Scan on 08/30/2017 12:49 PM by Default, Provider, MD  Electronic signature on 08/30/2017 8:59 AM  Electronic signature on 08/30/2017 8:59 AM      Signed   Electronically signed by Larey Dresser, MD on 08/30/17 at 41 EDT      Impression:  Patient has mild to moderate aortic stenosis with moderate to severe aortic insufficiency and severe mitral regurgitation. He presented with a progression of symptoms of exertional shortness of breath consistent with chronic diastolic congestive heart failure followed by sudden acute presentation with acute hypoxemic respiratory failure in the setting of acute on chronic diastolic congestive heart failure in the setting of rapid atrial fibrillation this past August.  His acute presentation was complicated by ventilator-dependent respiratory failure hypotension requiring pressors, acute renal failure, and severe confusion and delirium, all of which has resolved. He appears to be still  recovering from his acute illness, but he has made excellent progress over the last several weeks. At present he describes stable symptoms of exertional shortness of breath and fatigue consistent with chronic diastolic congestive heart failure, [place] Heart Association functional class IIb.  He has not had recurrent palpitations and appears to be maintaining sinus rhythm on oral amiodarone. His physical strength is slowly improving but he is not quite back to his baseline.  I have personally reviewed the patient's most recent transthoracic and transesophageal echocardiograms and diagnostic cardiac catheterization. The aortic valve is trileaflet with mild sclerosis and restricted leaflet mobility. There is moderate to severe central aortic insufficiency. This may be rheumatic in etiology, and I  do not feel that the aortic valve is repairable. There is type III dysfunction of the mitral valve with severe restriction involving primarily the posterior leaflet. The mitral valve is mildly thickened. This may be ischemic and secondary to previous inferior wall myocardial infarction, although the possibility of rheumatic disease cannot be definitively ruled out. I remain hopeful that the mitral valve may be repairable, but mitral valve replacement may be necessary. 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 This may or may not be a graftable target vessel for surgical revascularization.  I agree the patient would probably best be treated with aortic valve replacement, mitral valve repair or replacement, Maze procedure, and coronary artery bypass grafting. However, risks associated with surgery will be extremely high in the patient's postoperative convalescence may be prolonged even the absence of any significant complications. Alternatively,  edge to edge palliative mitral clip could be considered with continued medical therapy for the patient's aortic valve disease, atrial fibrillation, and coronary artery disease. Continued medical therapy without some type of intervention be associated with relatively poor prognosis. Although the mitral clip might be better than continued medical therapy, long-term prognosis would clearly be best with definitive surgical intervention.   Plan:  I have discussed the nature of the patient's complex cardiac problems at length with the patient and his wife in the office today. We discussed treatment options at length including high risk surgery for aortic valve replacement, mitral valve repair or replacement, possible Maze procedure, and possible coronary artery bypass grafting. The risks associated with surgery and expectations for the patient's postoperative convalescence with or without any significant, occasions have been discussed. Long-term medical therapy without any type of surgical intervention was discussed. The mitral clip edge to edge repair was discussed as a palliative approach. All of their questions been addressed.  The patient is interested in proceeding with an aggressive approach to his care including high risk complex surgery. Both he and his wife feel that he has not completely recovered from his recent hospitalization, and under the circumstances it makes sense to wait for at least a few more weeks to see if his strength will continue to improve. During the interim period of time we will obtain a cardiac gated CT angiogram of the heart to evaluate the aortic and mitral valves for surgical planning purposes.  The patient will continue to follow up closely in the advanced heart failure clinic. He will return to our office for follow-up in 3 weeks to see how he is coming along and potentially discuss timing of surgery.   I spent in excess of 90 minutes during the conduct of this office  consultation and >50% of this time involved direct face-to-face encounter with the patient for counseling and/or coordination of their care.    Valentina Gu. Roxy Manns, MD 09/15/2017 12:18 PM

## 2017-09-18 ENCOUNTER — Other Ambulatory Visit: Payer: Self-pay | Admitting: *Deleted

## 2017-09-18 ENCOUNTER — Other Ambulatory Visit (HOSPITAL_COMMUNITY): Payer: Self-pay

## 2017-09-18 DIAGNOSIS — I5033 Acute on chronic diastolic (congestive) heart failure: Secondary | ICD-10-CM | POA: Diagnosis not present

## 2017-09-18 DIAGNOSIS — I350 Nonrheumatic aortic (valve) stenosis: Secondary | ICD-10-CM

## 2017-09-18 DIAGNOSIS — D631 Anemia in chronic kidney disease: Secondary | ICD-10-CM | POA: Diagnosis not present

## 2017-09-18 DIAGNOSIS — I351 Nonrheumatic aortic (valve) insufficiency: Secondary | ICD-10-CM

## 2017-09-18 DIAGNOSIS — I4891 Unspecified atrial fibrillation: Secondary | ICD-10-CM | POA: Diagnosis not present

## 2017-09-18 DIAGNOSIS — Z4789 Encounter for other orthopedic aftercare: Secondary | ICD-10-CM | POA: Diagnosis not present

## 2017-09-18 DIAGNOSIS — I130 Hypertensive heart and chronic kidney disease with heart failure and stage 1 through stage 4 chronic kidney disease, or unspecified chronic kidney disease: Secondary | ICD-10-CM | POA: Diagnosis not present

## 2017-09-18 DIAGNOSIS — N183 Chronic kidney disease, stage 3 (moderate): Secondary | ICD-10-CM | POA: Diagnosis not present

## 2017-09-18 DIAGNOSIS — I69331 Monoplegia of upper limb following cerebral infarction affecting right dominant side: Secondary | ICD-10-CM | POA: Diagnosis not present

## 2017-09-18 DIAGNOSIS — J449 Chronic obstructive pulmonary disease, unspecified: Secondary | ICD-10-CM | POA: Diagnosis not present

## 2017-09-18 DIAGNOSIS — R1310 Dysphagia, unspecified: Secondary | ICD-10-CM | POA: Diagnosis not present

## 2017-09-18 MED ORDER — APIXABAN 5 MG PO TABS: 5.0000 mg | ORAL_TABLET | Freq: Two times a day (BID) | ORAL | 12 refills | Status: DC

## 2017-09-18 NOTE — Telephone Encounter (Signed)
Pt wife called needing refill, pt will be out soon. Meds refilled and samples left at front.   Medication Samples have been provided to the patient.  Drug name: eliquis       Strength: 5 mg        Qty: 1  LOT: [id number]  Exp.Date: 12/20  Dosing instructions: 1 tab twice a day  The patient has been instructed regarding the correct time, dose, and frequency of taking this medication, including desired effects and most common side effects.   Shirley Muscat 4:49 PM 09/18/2017

## 2017-09-19 ENCOUNTER — Other Ambulatory Visit (HOSPITAL_COMMUNITY): Payer: Self-pay | Admitting: *Deleted

## 2017-09-19 MED ORDER — APIXABAN 5 MG PO TABS: 5.0000 mg | ORAL_TABLET | Freq: Two times a day (BID) | ORAL | 3 refills | Status: DC

## 2017-09-20 ENCOUNTER — Ambulatory Visit (HOSPITAL_COMMUNITY)
Admission: RE | Admit: 2017-09-20 | Discharge: 2017-09-20 | Disposition: A | Discharge status: Home / Self Care | Payer: Medicare HMO | Source: Ambulatory Visit | Attending: Internal Medicine | Admitting: Internal Medicine

## 2017-09-20 DIAGNOSIS — I130 Hypertensive heart and chronic kidney disease with heart failure and stage 1 through stage 4 chronic kidney disease, or unspecified chronic kidney disease: Secondary | ICD-10-CM | POA: Diagnosis not present

## 2017-09-20 DIAGNOSIS — I4891 Unspecified atrial fibrillation: Secondary | ICD-10-CM | POA: Diagnosis not present

## 2017-09-20 DIAGNOSIS — N183 Chronic kidney disease, stage 3 (moderate): Secondary | ICD-10-CM | POA: Diagnosis not present

## 2017-09-20 DIAGNOSIS — I69331 Monoplegia of upper limb following cerebral infarction affecting right dominant side: Secondary | ICD-10-CM | POA: Diagnosis not present

## 2017-09-20 DIAGNOSIS — I5032 Chronic diastolic (congestive) heart failure: Secondary | ICD-10-CM | POA: Diagnosis not present

## 2017-09-20 DIAGNOSIS — D631 Anemia in chronic kidney disease: Secondary | ICD-10-CM | POA: Diagnosis not present

## 2017-09-20 DIAGNOSIS — I5033 Acute on chronic diastolic (congestive) heart failure: Secondary | ICD-10-CM | POA: Diagnosis not present

## 2017-09-20 DIAGNOSIS — R1310 Dysphagia, unspecified: Secondary | ICD-10-CM | POA: Diagnosis not present

## 2017-09-20 DIAGNOSIS — Z4789 Encounter for other orthopedic aftercare: Secondary | ICD-10-CM | POA: Diagnosis not present

## 2017-09-20 DIAGNOSIS — J449 Chronic obstructive pulmonary disease, unspecified: Secondary | ICD-10-CM | POA: Diagnosis not present

## 2017-09-20 LAB — BASIC METABOLIC PANEL
ANION GAP: 12 (ref 5–15)
BUN: 21 mg/dL — ABNORMAL HIGH (ref 6–20)
CALCIUM: 9.2 mg/dL (ref 8.9–10.3)
CO2: 20 mmol/L — ABNORMAL LOW (ref 22–32)
CREATININE: 1.45 mg/dL — AB (ref 0.61–1.24)
Chloride: 104 mmol/L (ref 101–111)
GFR calc non Af Amer: 47 mL/min — ABNORMAL LOW (ref >60)
GFR, EST AFRICAN AMERICAN: 54 mL/min — AB (ref >60)
Glucose, Bld: 107 mg/dL — ABNORMAL HIGH (ref 65–99)
Potassium: 4.3 mmol/L (ref 3.5–5.1)
SODIUM: 136 mmol/L (ref 135–145)

## 2017-09-21 ENCOUNTER — Institutional Professional Consult (permissible substitution): Payer: Medicare HMO | Admitting: Internal Medicine

## 2017-09-21 ENCOUNTER — Other Ambulatory Visit (HOSPITAL_COMMUNITY): Payer: Self-pay | Admitting: *Deleted

## 2017-09-27 ENCOUNTER — Ambulatory Visit (HOSPITAL_COMMUNITY)
Admission: RE | Admit: 2017-09-27 | Discharge: 2017-09-27 | Disposition: A | Discharge status: Home / Self Care | Payer: Medicare HMO | Source: Ambulatory Visit | Attending: Cardiology | Admitting: Cardiology

## 2017-09-27 ENCOUNTER — Other Ambulatory Visit: Payer: Self-pay

## 2017-09-27 ENCOUNTER — Encounter (HOSPITAL_COMMUNITY): Payer: Self-pay | Admitting: Cardiology

## 2017-09-27 VITALS — BP 141/71 | HR 78 | Wt 230.0 lb

## 2017-09-27 DIAGNOSIS — Q249 Congenital malformation of heart, unspecified: Secondary | ICD-10-CM | POA: Insufficient documentation

## 2017-09-27 DIAGNOSIS — I491 Atrial premature depolarization: Secondary | ICD-10-CM | POA: Insufficient documentation

## 2017-09-27 DIAGNOSIS — D509 Iron deficiency anemia, unspecified: Secondary | ICD-10-CM | POA: Insufficient documentation

## 2017-09-27 DIAGNOSIS — N183 Chronic kidney disease, stage 3 (moderate): Secondary | ICD-10-CM | POA: Insufficient documentation

## 2017-09-27 DIAGNOSIS — Z7901 Long term (current) use of anticoagulants: Secondary | ICD-10-CM | POA: Insufficient documentation

## 2017-09-27 DIAGNOSIS — I700 Atherosclerosis of aorta: Secondary | ICD-10-CM | POA: Diagnosis not present

## 2017-09-27 DIAGNOSIS — I5032 Chronic diastolic (congestive) heart failure: Secondary | ICD-10-CM

## 2017-09-27 DIAGNOSIS — Z79899 Other long term (current) drug therapy: Secondary | ICD-10-CM | POA: Insufficient documentation

## 2017-09-27 DIAGNOSIS — Z87891 Personal history of nicotine dependence: Secondary | ICD-10-CM | POA: Insufficient documentation

## 2017-09-27 DIAGNOSIS — K219 Gastro-esophageal reflux disease without esophagitis: Secondary | ICD-10-CM | POA: Insufficient documentation

## 2017-09-27 DIAGNOSIS — M109 Gout, unspecified: Secondary | ICD-10-CM | POA: Diagnosis not present

## 2017-09-27 DIAGNOSIS — Z8673 Personal history of transient ischemic attack (TIA), and cerebral infarction without residual deficits: Secondary | ICD-10-CM | POA: Diagnosis not present

## 2017-09-27 DIAGNOSIS — I480 Paroxysmal atrial fibrillation: Secondary | ICD-10-CM | POA: Diagnosis not present

## 2017-09-27 DIAGNOSIS — I080 Rheumatic disorders of both mitral and aortic valves: Secondary | ICD-10-CM | POA: Insufficient documentation

## 2017-09-27 DIAGNOSIS — K449 Diaphragmatic hernia without obstruction or gangrene: Secondary | ICD-10-CM | POA: Diagnosis not present

## 2017-09-27 DIAGNOSIS — I059 Rheumatic mitral valve disease, unspecified: Secondary | ICD-10-CM

## 2017-09-27 DIAGNOSIS — I351 Nonrheumatic aortic (valve) insufficiency: Secondary | ICD-10-CM | POA: Diagnosis not present

## 2017-09-27 DIAGNOSIS — J449 Chronic obstructive pulmonary disease, unspecified: Secondary | ICD-10-CM | POA: Diagnosis not present

## 2017-09-27 DIAGNOSIS — I359 Nonrheumatic aortic valve disorder, unspecified: Secondary | ICD-10-CM

## 2017-09-27 DIAGNOSIS — H9190 Unspecified hearing loss, unspecified ear: Secondary | ICD-10-CM | POA: Diagnosis not present

## 2017-09-27 DIAGNOSIS — I2510 Atherosclerotic heart disease of native coronary artery without angina pectoris: Secondary | ICD-10-CM | POA: Diagnosis not present

## 2017-09-27 DIAGNOSIS — I130 Hypertensive heart and chronic kidney disease with heart failure and stage 1 through stage 4 chronic kidney disease, or unspecified chronic kidney disease: Secondary | ICD-10-CM | POA: Insufficient documentation

## 2017-09-27 DIAGNOSIS — I38 Endocarditis, valve unspecified: Secondary | ICD-10-CM

## 2017-09-27 DIAGNOSIS — I350 Nonrheumatic aortic (valve) stenosis: Secondary | ICD-10-CM | POA: Diagnosis not present

## 2017-09-27 DIAGNOSIS — E785 Hyperlipidemia, unspecified: Secondary | ICD-10-CM | POA: Insufficient documentation

## 2017-09-27 LAB — CBC
HCT: 36.2 % — ABNORMAL LOW (ref 39.0–52.0)
HEMOGLOBIN: 11.6 g/dL — AB (ref 13.0–17.0)
MCH: 30.7 pg (ref 26.0–34.0)
MCHC: 32 g/dL (ref 30.0–36.0)
MCV: 95.8 fL (ref 78.0–100.0)
Platelets: 257 10*3/uL (ref 150–400)
RBC: 3.78 MIL/uL — AB (ref 4.22–5.81)
RDW: 15.4 % (ref 11.5–15.5)
WBC: 8.4 10*3/uL (ref 4.0–10.5)

## 2017-09-27 LAB — BASIC METABOLIC PANEL
Anion gap: 8 (ref 5–15)
BUN: 18 mg/dL (ref 6–20)
CHLORIDE: 110 mmol/L (ref 101–111)
CO2: 20 mmol/L — ABNORMAL LOW (ref 22–32)
CREATININE: 1.27 mg/dL — AB (ref 0.61–1.24)
Calcium: 9.2 mg/dL (ref 8.9–10.3)
GFR calc Af Amer: >60 mL/min (ref >60)
GFR calc non Af Amer: 55 mL/min — ABNORMAL LOW (ref >60)
GLUCOSE: 99 mg/dL (ref 65–99)
Potassium: 4.3 mmol/L (ref 3.5–5.1)
SODIUM: 138 mmol/L (ref 135–145)

## 2017-09-27 MED ORDER — APIXABAN 5 MG PO TABS
5.0000 mg | ORAL_TABLET | Freq: Two times a day (BID) | ORAL | 3 refills | Status: DC
Start: 2017-09-27 — End: 2017-11-24

## 2017-09-27 MED ORDER — LOVASTATIN 40 MG PO TABS: 40.0000 mg | ORAL_TABLET | Freq: Every day | ORAL | 3 refills | Status: DC

## 2017-09-27 NOTE — Patient Instructions (Signed)
Increase Lovastatin 40 mg (1 tab) every evening  Labs drawn today (if we do not call you, then your lab work was stable)   Your physician recommends that you return for lab work in: 2 months   Your physician recommends that you schedule a follow-up appointment in: 3 months

## 2017-09-27 NOTE — Telephone Encounter (Signed)
Pt last saw Dr Aundra Dubin on 08/24/17, last labs 09/20/17 Creat 1.45, age 72, weight 102.3kg, based on specified criteria pt is on appropriate dosage of Eliquis 5mg  BID.  Will refill rx.

## 2017-09-28 ENCOUNTER — Encounter (HOSPITAL_COMMUNITY): Payer: Self-pay | Admitting: Physician Assistant

## 2017-09-28 ENCOUNTER — Ambulatory Visit (HOSPITAL_COMMUNITY)
Admission: RE | Admit: 2017-09-28 | Discharge: 2017-09-28 | Disposition: A | Discharge status: Home / Self Care | Payer: Medicare HMO | Source: Ambulatory Visit | Attending: Cardiology | Admitting: Cardiology

## 2017-09-28 ENCOUNTER — Ambulatory Visit (HOSPITAL_COMMUNITY)
Admission: RE | Admit: 2017-09-28 | Discharge: 2017-09-28 | Disposition: A | Discharge status: Home / Self Care | Payer: Medicare HMO | Source: Ambulatory Visit | Attending: Thoracic Surgery (Cardiothoracic Vascular Surgery) | Admitting: Thoracic Surgery (Cardiothoracic Vascular Surgery)

## 2017-09-28 ENCOUNTER — Ambulatory Visit (HOSPITAL_COMMUNITY)
Admission: RE | Admit: 2017-09-28 | Discharge: 2017-09-28 | Disposition: A | Discharge status: Home / Self Care | Payer: Medicare HMO | Source: Ambulatory Visit | Attending: Interventional Radiology | Admitting: Interventional Radiology

## 2017-09-28 ENCOUNTER — Other Ambulatory Visit (HOSPITAL_COMMUNITY): Payer: Self-pay | Admitting: Interventional Radiology

## 2017-09-28 ENCOUNTER — Other Ambulatory Visit: Payer: Self-pay | Admitting: Cardiology

## 2017-09-28 DIAGNOSIS — I872 Venous insufficiency (chronic) (peripheral): Secondary | ICD-10-CM | POA: Diagnosis not present

## 2017-09-28 DIAGNOSIS — Q249 Congenital malformation of heart, unspecified: Secondary | ICD-10-CM | POA: Diagnosis not present

## 2017-09-28 DIAGNOSIS — I480 Paroxysmal atrial fibrillation: Secondary | ICD-10-CM | POA: Diagnosis not present

## 2017-09-28 DIAGNOSIS — I5032 Chronic diastolic (congestive) heart failure: Secondary | ICD-10-CM | POA: Diagnosis not present

## 2017-09-28 DIAGNOSIS — I200 Unstable angina: Secondary | ICD-10-CM

## 2017-09-28 DIAGNOSIS — N183 Chronic kidney disease, stage 3 (moderate): Secondary | ICD-10-CM | POA: Diagnosis not present

## 2017-09-28 DIAGNOSIS — I350 Nonrheumatic aortic (valve) stenosis: Secondary | ICD-10-CM

## 2017-09-28 DIAGNOSIS — I700 Atherosclerosis of aorta: Secondary | ICD-10-CM | POA: Diagnosis not present

## 2017-09-28 DIAGNOSIS — I130 Hypertensive heart and chronic kidney disease with heart failure and stage 1 through stage 4 chronic kidney disease, or unspecified chronic kidney disease: Secondary | ICD-10-CM | POA: Diagnosis not present

## 2017-09-28 DIAGNOSIS — I080 Rheumatic disorders of both mitral and aortic valves: Secondary | ICD-10-CM | POA: Diagnosis not present

## 2017-09-28 DIAGNOSIS — I351 Nonrheumatic aortic (valve) insufficiency: Secondary | ICD-10-CM

## 2017-09-28 DIAGNOSIS — I491 Atrial premature depolarization: Secondary | ICD-10-CM | POA: Diagnosis not present

## 2017-09-28 DIAGNOSIS — K449 Diaphragmatic hernia without obstruction or gangrene: Secondary | ICD-10-CM | POA: Diagnosis not present

## 2017-09-28 HISTORY — PX: IR US GUIDE VASC ACCESS RIGHT: IMG2390

## 2017-09-28 HISTORY — PX: IR RADIOLOGY PERIPHERAL GUIDED IV START: IMG5598

## 2017-09-28 MED ORDER — METOPROLOL TARTRATE 5 MG/5ML IV SOLN
5.0000 mg | Freq: Once | INTRAVENOUS | Status: DC
  Filled 2017-09-28: qty 5

## 2017-09-28 MED ORDER — METOPROLOL TARTRATE 5 MG/5ML IV SOLN
INTRAVENOUS | Status: AC
  Administered 2017-09-28: 5 mg
  Filled 2017-09-28: qty 5

## 2017-09-28 MED ORDER — LIDOCAINE HCL (PF) 1 % IJ SOLN
INTRAMUSCULAR | Status: DC | PRN
  Administered 2017-09-28: 5 mL

## 2017-09-28 MED ORDER — LIDOCAINE 2% (20 MG/ML) 5 ML SYRINGE
INTRAMUSCULAR | Status: AC
  Filled 2017-09-28: qty 10

## 2017-09-28 MED ORDER — IOPAMIDOL (ISOVUE-370) INJECTION 76%
INTRAVENOUS | Status: AC
  Filled 2017-09-28: qty 100

## 2017-09-28 MED ORDER — NITROGLYCERIN 0.4 MG SL SUBL
SUBLINGUAL_TABLET | SUBLINGUAL | Status: AC
  Filled 2017-09-28: qty 1

## 2017-09-28 MED ORDER — NITROGLYCERIN 0.4 MG SL SUBL
0.4000 mg | SUBLINGUAL_TABLET | Freq: Once | SUBLINGUAL | Status: AC
  Administered 2017-09-28: 0.4 mg via SUBLINGUAL
  Filled 2017-09-28: qty 25

## 2017-09-28 NOTE — Progress Notes (Signed)
CT completed. Tolerated well.. D/C home walking with wife. Awake and alert. In no distress,.

## 2017-09-28 NOTE — Progress Notes (Signed)
PCP: Dr. Justin Mend Cardiology: Dr. Radford Pax HF Cardiology: Dr. Aundra Dubin  71 yo with history of CVA, paroxysmal atrial fibrillation, valvular heart disease, and chronic diastolic CHF presents for evaluation of CHF and AS/MR.    He had had episodes of dyspnea and initial workup led to a TEE in 5/18 showing normal EF with moderate AS and moderate MR.  He continued to have episodic dyspnea and ended up admitted in 8/18 with shortness of breath and chest pressure.  This led to a long, complicated hospitalization.  He was noted to be volume overloaded with acute diastolic CHF and was also noted to have symptomatic runs of atrial fibrillation with RVR.  Amiodarone was started to control the atrial fibrillation.  He developed respiratory distress => Bipap => intubated.  He became hypotensive, requiring pressors.  He developed AKI.  PNA was noted and he received broad spectrum abx.  He had a prolonged intubation but was eventually extubated.  Given deconditioning from long hospitalization, he went to CIR for a couple of weeks.  LHC in 9/18 showed occluded PDA with left>right collaterals.  TEE in 9/18 showed EF 50%, severe MR (possibly infarct-related with restricted posterior leaflet, and moderate AS + moderate-severe AI (possibly rheumatic).   He is doing better overall.  No longer using wheelchair or walker.  He has finished PT.  Weight down 1 lb.  No chest pain.  No palpitations, he is in NSR today. No BRBPR/melena.  He is not short of breath walking on flat ground.  No orthopnea/PND.      ECG (personally reviewed): NSR, PAC  Labs (9/18): K 4.5, creatinine 1.42, LDL 90, HDL 36, LFTs normal, TSH normal, hgb 9.6 Labs (10/18): K 4.3, creatinine 1.45  PMH: 1. CVA: Right MCA, 2004.  Minimal residual problems.  2. HTN 3. Hyperlipidemia 4. Left rotator cuff surgery 6/18 5. Carotid stenosis: s/p right carotid stent.  6. GERD 7. H/o CCY 8. Anemia: FOBT negative.  9. Gout  10. Atrial fibrillation: Paroxysmal.  11.  Valvular heart disease: TEE (5/18) with EF 55-60%, moderate AS mean 33 mmHg, moderate MR, normal RV size and systolic function.  - Echo (8/18): EF 55-60%, moderate LVH, moderate AS with mean gradient 33 mmHg, moderate AI, moderate to severe MR.  - TEE (9/18): EF 50%, mild LV dilation, suspect severe MR with posterior leaflet restricted (?infarct-related MR), ERO 0.4 cm^2, moderate AS/moderate-severe AI (possibly rheumatic).   12. Chronic diastolic CHF 13. Deafness 14. CKD: stage 3.  15. 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  17. COPD: Mild obstruction on 9/18 PFTs.   SH: Quit smoking 8/18.  Married, 2 children, retired.    Family History  Problem Relation Age of Onset  . Stroke Father        No details  . Angina Mother    ROS: All systems reviewed and negative except as per HPI.   Current Outpatient Prescriptions  Medication Sig Dispense Refill  . amiodarone (PACERONE) 200 MG tablet Take 1 tablet (200 mg total) by mouth daily. 90 tablet 3  . apixaban (ELIQUIS) 5 MG TABS tablet Take 1 tablet (5 mg total) by mouth 2 (two) times daily. 180 tablet 3  . calcium carbonate (TUMS - DOSED IN MG ELEMENTAL CALCIUM) 500 MG chewable tablet Chew 1 tablet by mouth daily as needed for indigestion or heartburn.    [name]  citalopram (CELEXA) 20 MG tablet Take 1 tablet (20 mg total) by mouth at bedtime. 30 tablet 5  . docusate sodium (COLACE) 100 MG capsule Take 1 capsule (100 mg total) by mouth 3 (three) times daily as needed. 20 capsule 0  . ferrous sulfate 325 (65 FE) MG tablet Take 1 tablet (325 mg total) by mouth daily with breakfast. 30 tablet 3  . furosemide (LASIX) 40 MG tablet Take 1 tablet (40 mg total) by mouth daily. 90 tablet 3  . lisinopril (PRINIVIL,ZESTRIL) 10 MG tablet Take 1 tablet (10 mg total) by mouth daily. 90 tablet 3  . LORazepam (ATIVAN) 0.5 MG  tablet Take 1 tablet (0.5 mg total) by mouth every 6 (six) hours as needed for anxiety (aggitation). 20 tablet 0  . lovastatin (MEVACOR) 40 MG tablet Take 1 tablet (40 mg total) by mouth at bedtime. 30 tablet 3  . nitroGLYCERIN (NITROSTAT) 0.4 MG SL tablet Place 1 tablet (0.4 mg total) under the tongue every 5 (five) minutes x 3 doses as needed for chest pain. 30 tablet 12  . ondansetron (ZOFRAN) 4 MG tablet Take 4 mg by mouth every 8 (eight) hours as needed for nausea or vomiting.    [name] oxyCODONE-acetaminophen (ROXICET) 5-325 MG tablet Take 1-2 tablets by mouth every 4 (four) hours as needed for severe pain. 30 tablet 0  . pantoprazole (PROTONIX) 20 MG tablet Take 1 tablet (20 mg total) by mouth daily. 30 tablet 5  . potassium chloride SA (K-DUR,KLOR-CON) 20 MEQ tablet Take 1 tablet (20 mEq total) by mouth daily. 90 tablet 3  . Pseudoeph-Doxylamine-DM-APAP (NYQUIL PO) Take by mouth at bedtime as needed. Rarely uses    . traZODone (DESYREL) 50 MG tablet Take 1 tablet (50 mg total) by mouth at bedtime. 30 tablet 5   No current facility-administered medications for this encounter.    Facility-Administered Medications Ordered in Other Encounters  Medication Dose Route Frequency Provider Last Rate Last Dose  . lidocaine (PF) (XYLOCAINE) 1 % injection    PRN Ardis Rowan, PA-C   5 mL at 09/28/17 0925  . metoprolol tartrate (LOPRESSOR) injection 5 mg  5 mg Intravenous Once Dorothy Spark, MD       BP (!) 141/71   Pulse 78   Wt 230 lb (104.3 kg)   SpO2 97%   BMI 33.97 kg/m   General: NAD Neck: Thick, no JVD, no thyromegaly or thyroid nodule.  Lungs: Clear to auscultation bilaterally with normal respiratory effort. CV: Nondisplaced PMI.  Heart regular S1/S2, no V7/Q4, 2/6 systolic murmur RUSB and apex.  No peripheral edema.  No carotid bruit.  Normal pedal pulses.  Abdomen: Soft, nontender, no hepatosplenomegaly, no distention.  Skin: Intact without lesions or rashes.  Neurologic:  Alert and oriented x 3.  Psych: Normal affect. Extremities: No clubbing or cyanosis.  HEENT: Normal.   Assessment/Plan: 1. Valvular heart disease: TEE in 9/18 showed severe MR, probably infarct-related with restrictive posterior mitral leaflet. There was moderate aortic stenosis and moderate-severe aortic insufficiency, possible rheumatic.  Mr Buras has seen Dr. Roxy Manns with [facility], and plan currently is to move forward with aortic valve replacement/mitral valve repair.  2. Chronic diastolic CHF: In setting of significant valvular disease as above.  Suspect recent diastolic CHF exacerbation was triggered by poorly tolerated paroxysmal atrial fibrillation in setting of valvular disease.  On exam today, he looks near-euvolemic.  - Continue Lasix 40 mg daily. BMET today.   3. CKD: Stage 3.  Most recent  creatinine 1.45.  BMET today.  4. Atrial fibrillation: Paroxysmal.  He tends to tolerate afib poorly.  He is now maintaining NSR on amiodarone.  - Continue amiodarone 200 mg daily.  Recent LFTs and TSH normal.  Baseline PFTs 9/18 were not suggestive of amiodarone lung disease.  He will need regular eye exam.   - Continue apixaban, check CBC.  - Plan for Maze with valve surgery.  5. COPD: Mild obstruction on PFTs.  He has quit smoking.    6. CVA: Remote, minimal residual. S/p carotid stenting.  - No ASA given apixaban use.  7. Hyperlipidemia: Increase lovastatin to 40 mg daily with LDL > 70 (goal < 70 with coronary disease).  8. Anemia: Fe deficiency.  FOBT negative 8/18.  Got feraheme in 8/18.  Should have eventual GI workup.  9. 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  The artery to the PDA was occluded with left to right collaterals.   - Increase statin as above.   - No ASA given apixaban use.   Loralie Champagne 09/28/2017

## 2017-10-04 DIAGNOSIS — Z4789 Encounter for other orthopedic aftercare: Secondary | ICD-10-CM | POA: Diagnosis not present

## 2017-10-04 DIAGNOSIS — I4891 Unspecified atrial fibrillation: Secondary | ICD-10-CM | POA: Diagnosis not present

## 2017-10-04 DIAGNOSIS — I5033 Acute on chronic diastolic (congestive) heart failure: Secondary | ICD-10-CM | POA: Diagnosis not present

## 2017-10-04 DIAGNOSIS — R1310 Dysphagia, unspecified: Secondary | ICD-10-CM | POA: Diagnosis not present

## 2017-10-04 DIAGNOSIS — I69331 Monoplegia of upper limb following cerebral infarction affecting right dominant side: Secondary | ICD-10-CM | POA: Diagnosis not present

## 2017-10-04 DIAGNOSIS — J449 Chronic obstructive pulmonary disease, unspecified: Secondary | ICD-10-CM | POA: Diagnosis not present

## 2017-10-04 DIAGNOSIS — D631 Anemia in chronic kidney disease: Secondary | ICD-10-CM | POA: Diagnosis not present

## 2017-10-04 DIAGNOSIS — N183 Chronic kidney disease, stage 3 (moderate): Secondary | ICD-10-CM | POA: Diagnosis not present

## 2017-10-04 DIAGNOSIS — I130 Hypertensive heart and chronic kidney disease with heart failure and stage 1 through stage 4 chronic kidney disease, or unspecified chronic kidney disease: Secondary | ICD-10-CM | POA: Diagnosis not present

## 2017-10-09 ENCOUNTER — Other Ambulatory Visit: Payer: Self-pay

## 2017-10-09 ENCOUNTER — Encounter: Payer: Self-pay | Admitting: Thoracic Surgery (Cardiothoracic Vascular Surgery)

## 2017-10-09 ENCOUNTER — Ambulatory Visit (INDEPENDENT_AMBULATORY_CARE_PROVIDER_SITE_OTHER): Payer: Medicare HMO | Admitting: Thoracic Surgery (Cardiothoracic Vascular Surgery)

## 2017-10-09 VITALS — BP 106/62 | HR 72 | Resp 16 | Ht 69.0 in | Wt 226.0 lb

## 2017-10-09 DIAGNOSIS — I352 Nonrheumatic aortic (valve) stenosis with insufficiency: Secondary | ICD-10-CM | POA: Diagnosis not present

## 2017-10-09 DIAGNOSIS — I5032 Chronic diastolic (congestive) heart failure: Secondary | ICD-10-CM

## 2017-10-09 DIAGNOSIS — I080 Rheumatic disorders of both mitral and aortic valves: Secondary | ICD-10-CM | POA: Diagnosis not present

## 2017-10-09 DIAGNOSIS — I351 Nonrheumatic aortic (valve) insufficiency: Secondary | ICD-10-CM

## 2017-10-09 DIAGNOSIS — I350 Nonrheumatic aortic (valve) stenosis: Secondary | ICD-10-CM

## 2017-10-09 DIAGNOSIS — I340 Nonrheumatic mitral (valve) insufficiency: Secondary | ICD-10-CM

## 2017-10-09 DIAGNOSIS — I2510 Atherosclerotic heart disease of native coronary artery without angina pectoris: Secondary | ICD-10-CM

## 2017-10-09 DIAGNOSIS — I4819 Other persistent atrial fibrillation: Secondary | ICD-10-CM

## 2017-10-09 DIAGNOSIS — N183 Chronic kidney disease, stage 3 unspecified: Secondary | ICD-10-CM

## 2017-10-09 DIAGNOSIS — I480 Paroxysmal atrial fibrillation: Secondary | ICD-10-CM

## 2017-10-09 NOTE — Progress Notes (Signed)
[address]       [place] [address]             [phone]     CARDIOTHORACIC SURGERY OFFICE NOTE  Referring Provider is Larey Dresser, MD PCP is Maurice Small, MD   HPI:  Patient returns to the office today for follow-up of aortic stenosis with aortic insufficiency, mitral regurgitation, coronary artery disease, and atrial fibrillation. He was originally seen in consultation on 09/15/2017. Since then he underwent CT angiography and he returns to the office today to discuss treatment options further. The patient and his wife both state that the patient has continued to make slow but steady improvement over the last few weeks. He still gets short of breath with activity, but overall his strength is improving. He states that he is quite back to his baseline but he is close. He has had an upper respiratory tract infection recently that he has and recovering from. He denies any fevers or chills.   Current Outpatient Prescriptions  Medication Sig Dispense Refill  . amiodarone (PACERONE) 200 MG tablet Take 1 tablet (200 mg total) by mouth daily. 90 tablet 3  . apixaban (ELIQUIS) 5 MG TABS tablet Take 1 tablet (5 mg total) by mouth 2 (two) times daily. 180 tablet 3  . calcium carbonate (TUMS - DOSED IN MG ELEMENTAL CALCIUM) 500 MG chewable tablet Chew 1 tablet by mouth daily as needed for indigestion or heartburn.    . citalopram (CELEXA) 20 MG tablet Take 1 tablet (20 mg total) by mouth at bedtime. 30 tablet 5  . docusate sodium (COLACE) 100 MG capsule Take 1 capsule (100 mg total) by mouth 3 (three) times daily as needed. 20 capsule 0  . ferrous sulfate 325 (65 FE) MG tablet Take 1 tablet (325 mg total) by mouth daily with breakfast. 30 tablet 3  . furosemide (LASIX) 40 MG tablet Take 1 tablet (40 mg total) by mouth daily. 90 tablet 3  . lisinopril (PRINIVIL,ZESTRIL) 10 MG tablet Take 1 tablet (10 mg total) by mouth daily. 90 tablet 3  . LORazepam (ATIVAN) 0.5 MG  tablet Take 1 tablet (0.5 mg total) by mouth every 6 (six) hours as needed for anxiety (aggitation). 20 tablet 0  . lovastatin (MEVACOR) 40 MG tablet Take 1 tablet (40 mg total) by mouth at bedtime. 30 tablet 3  . nitroGLYCERIN (NITROSTAT) 0.4 MG SL tablet Place 1 tablet (0.4 mg total) under the tongue every 5 (five) minutes x 3 doses as needed for chest pain. 30 tablet 12  . Nutritional Supplements (COLD AND FLU PO) Take 2 tablets by mouth 4 (four) times daily.    . ondansetron (ZOFRAN) 4 MG tablet Take 4 mg by mouth every 8 (eight) hours as needed for nausea or vomiting.    [name] oxyCODONE-acetaminophen (ROXICET) 5-325 MG tablet Take 1-2 tablets by mouth every 4 (four) hours as needed for severe pain. 30 tablet 0  . pantoprazole (PROTONIX) 20 MG tablet Take 1 tablet (20 mg total) by mouth daily. 30 tablet 5  . potassium chloride SA (K-DUR,KLOR-CON) 20 MEQ tablet Take 1 tablet (20 mEq total) by mouth daily. 90 tablet 3  . Pseudoeph-Doxylamine-DM-APAP (NYQUIL PO) Take by mouth at bedtime as needed. Rarely uses    . traZODone (DESYREL) 50 MG tablet Take 1 tablet (50 mg total) by mouth at bedtime. 30 tablet 5   No current facility-administered medications for this visit.  Physical Exam:   BP 106/62 (BP Location: Right Arm, Patient Position: Sitting, Cuff Size: Large)   Pulse 72   Resp 16   Ht 5\' 9"  (1.753 m)   Wt 226 lb (102.5 kg)   SpO2 94% Comment: RA  BMI 33.37 kg/m   General:  Well-appearing  Chest:   Clear to auscultation  CV:   Regular rate and rhythm with systolic murmur  Incisions:  n/a  Abdomen:  Soft nontender  Extremities:  Warm and well-perfused  Diagnostic Tests:  Cardiac TAVR CT  TECHNIQUE: The patient was scanned on a [employer]. A 120 kV retrospective scan was triggered in the descending thoracic aorta at 111 HU's. Gantry rotation speed was 250 msecs and collimation was .6 mm. 5 mg of iv Metoprolol and no nitro were given. The 3D data set was  reconstructed in 5% intervals of the R-R cycle. Systolic and diastolic phases were analyzed on a dedicated work station using MPR, MIP and VRT modes. The patient received 80 cc of contrast.  FINDINGS: Aortic Valve: Trileaflet, severely thickened and mildly calcified  leaflets with non-coaptation in diastole.  Aorta: Normal size, no dissection, mild diffuse calcifications  predominantly in the aortic arch and in the sinotubular junction.  Sinotubular Junction:  28 x 28 mm  Ascending Thoracic Aorta:  37 x 35 mm  Aortic Arch:  28 x 25 mm  Descending Thoracic Aorta:  30 x 29 mm  Sinus of Valsalva Measurements:  Non-coronary:  34 mm  Right -coronary:  32 mm  Left -coronary:  34 mm  Coronary Artery Height above Annulus:  Left Main:  16 mm  Right Coronary:  16 mm  Virtual Basal Annulus Measurements:  Maximum/Minimum Diameter:  27.8 x 21.4 mm  Mean Diameter:  23.8 mm  Perimeter:  76.9 mm  Area:  446 mm2  Mitral valve appears thickened with no prolapse. There are mild  posterior mitral annular calcifications.  Left atrium is severely dilated.  Coronary Arteries:  Normal origin.  No ASD or VSD.  IMPRESSION: 1. Trileaflet aortic valve with severely thickened and mildly calcified leaflets with non-coaptation in diastole.  2. Aorta has normal size, no dissection, mild diffuse calcifications predominantly in the aortic arch and in the sinotubular junction.  3. Mitral valve appears thickened with no prolapse. There are mild posterior mitral annular calcifications.  4.  No thrombus in the left atrial appendage.  5. Mildly dilated pulmonary artery measuring 34 x 30 mm suggestive of pulmonary hypertension.  Ena Dawley   Electronically Signed   By: Ena Dawley   On: 09/29/2017 09:59   Impression:  Patient has mild to moderate aortic stenosis with moderate to severe aortic insufficiency and severe mitral  regurgitation. He presented with a progression of symptoms of exertional shortness of breath consistent with chronic diastolic congestive heart failure followed by sudden acute presentation with acute hypoxemic respiratory failure in the setting of acute on chronic diastolic congestive heart failure in the setting of rapid atrial fibrillation this past August.  His acute presentation was complicated by ventilator-dependent respiratory failure hypotension requiring pressors, acute renal failure, and severe confusion and delirium, all of which has resolved. He appears to be still recovering from his acute illness, but he has made excellent progress over the last several weeks. At present he describes stable symptoms of exertional shortness of breath and fatigue consistent with chronic diastolic congestive heart failure, New York Heart Association functional class IIb.  He has not had recurrent palpitations  and appears to be maintaining sinus rhythm on oral amiodarone. His physical strength is slowly improving but he is not quite back to his baseline.  I have personally reviewed the patient's most recent transthoracic and transesophageal echocardiograms, diagnostic cardiac catheterization, and CT angiogram. The aortic valve is trileaflet with mild sclerosis and restricted leaflet mobility. There is moderate to severe central aortic insufficiency. This may be rheumatic in etiology, and I do not feel that the aortic valve is repairable. There is type III dysfunction of the mitral valve with severe restriction involving primarily the posterior leaflet. The mitral valve is mildly thickened. This may be ischemic and secondary to previous inferior wall myocardial infarction, although the possibility of rheumatic disease cannot be definitively ruled out. I remain hopeful that the mitral valve may be repairable, but mitral valve replacement may be necessary. Diagnostic cardiac catheterization reveals chronic occlusion of  the dominant branch of the right coronary artery with left-to-right collateral filling of the terminal branches of the posterior descending coronary artery. The terminal branches of the right coronary artery may or may not be graftable target vessels for surgical revascularization.  CT angiography reveals no complicating features. The aortic annulus appears large enough for implantation of a 21 mm stented bioprosthetic tissue valve.  I agree the patient would probably best be treated with aortic valve replacement, mitral valve repair or replacement, Maze procedure, and coronary artery bypass grafting. However, risks associated with surgery will be extremely high in the patient's postoperative convalescence may be prolonged even the absence of any significant complications. Alternatively, edge to edge palliative mitral clip could be considered with continued medical therapy for the patient's aortic valve disease, atrial fibrillation, and coronary artery disease. Continued medical therapy without some type of intervention be associated with relatively poor prognosis. Although the mitral clip might be better than continued medical therapy, long-term prognosis would clearly be best with definitive surgical intervention.   Plan:  I have again reviewed treatment options at length with the patient and his wife in the office today. The patient desires to proceed with surgery in early December. We tentatively plan for aortic valve replacement, mitral valve repair or replacement, coronary artery bypass grafting, and Maze procedure on 11/16/2017. The patient has been instructed to stop taking Eliquis 7 days prior to surgery. He will return to our office for follow-up on 11/13/2017.  I spent in excess of 15 minutes during the conduct of this office consultation and >50% of this time involved direct face-to-face encounter with the patient for counseling and/or coordination of their care.    Valentina Gu. Roxy Manns,  MD 10/09/2017 10:51 AM

## 2017-10-09 NOTE — Patient Instructions (Signed)
Stop taking Eliquis 7 days prior to surgery (after you take your dose 11/28)  Continue taking all other medications without change through the day before surgery.  Have nothing to eat or drink after midnight the night before surgery.  On the morning of surgery take only Protonix with a sip of water.

## 2017-10-16 ENCOUNTER — Other Ambulatory Visit (HOSPITAL_COMMUNITY): Payer: Self-pay | Admitting: Respiratory Therapy

## 2017-10-17 DIAGNOSIS — I69331 Monoplegia of upper limb following cerebral infarction affecting right dominant side: Secondary | ICD-10-CM | POA: Diagnosis not present

## 2017-10-17 DIAGNOSIS — I4891 Unspecified atrial fibrillation: Secondary | ICD-10-CM | POA: Diagnosis not present

## 2017-10-17 DIAGNOSIS — I5033 Acute on chronic diastolic (congestive) heart failure: Secondary | ICD-10-CM | POA: Diagnosis not present

## 2017-10-17 DIAGNOSIS — D631 Anemia in chronic kidney disease: Secondary | ICD-10-CM | POA: Diagnosis not present

## 2017-10-17 DIAGNOSIS — R1310 Dysphagia, unspecified: Secondary | ICD-10-CM | POA: Diagnosis not present

## 2017-10-17 DIAGNOSIS — Z4789 Encounter for other orthopedic aftercare: Secondary | ICD-10-CM | POA: Diagnosis not present

## 2017-10-17 DIAGNOSIS — J449 Chronic obstructive pulmonary disease, unspecified: Secondary | ICD-10-CM | POA: Diagnosis not present

## 2017-10-17 DIAGNOSIS — I130 Hypertensive heart and chronic kidney disease with heart failure and stage 1 through stage 4 chronic kidney disease, or unspecified chronic kidney disease: Secondary | ICD-10-CM | POA: Diagnosis not present

## 2017-10-17 DIAGNOSIS — N183 Chronic kidney disease, stage 3 (moderate): Secondary | ICD-10-CM | POA: Diagnosis not present

## 2017-10-30 ENCOUNTER — Telehealth (HOSPITAL_COMMUNITY): Payer: Self-pay | Admitting: *Deleted

## 2017-10-30 NOTE — Telephone Encounter (Signed)
Received fax from [facility] requesting to hold patient Brandon [name] 5 days prior to his tooth extraction on Nov 29th.  Dr. Aundra Dubin recommends patient to only hold it 2 days prior.    Clearance faxed to [phone].

## 2017-11-13 ENCOUNTER — Ambulatory Visit (INDEPENDENT_AMBULATORY_CARE_PROVIDER_SITE_OTHER): Payer: Medicare HMO | Admitting: Thoracic Surgery (Cardiothoracic Vascular Surgery)

## 2017-11-13 ENCOUNTER — Encounter (HOSPITAL_COMMUNITY): Payer: Medicare HMO

## 2017-11-13 ENCOUNTER — Encounter (HOSPITAL_COMMUNITY)
Admission: RE | Admit: 2017-11-13 | Discharge: 2017-11-13 | Disposition: A | Discharge status: Home / Self Care | Payer: Medicare HMO | Source: Ambulatory Visit | Attending: Thoracic Surgery (Cardiothoracic Vascular Surgery) | Admitting: Thoracic Surgery (Cardiothoracic Vascular Surgery)

## 2017-11-13 ENCOUNTER — Encounter (HOSPITAL_COMMUNITY): Payer: Self-pay

## 2017-11-13 ENCOUNTER — Encounter: Payer: Self-pay | Admitting: Thoracic Surgery (Cardiothoracic Vascular Surgery)

## 2017-11-13 ENCOUNTER — Ambulatory Visit (HOSPITAL_COMMUNITY)
Admission: RE | Admit: 2017-11-13 | Discharge: 2017-11-13 | Disposition: A | Discharge status: Home / Self Care | Payer: Medicare HMO | Source: Ambulatory Visit | Attending: Thoracic Surgery (Cardiothoracic Vascular Surgery) | Admitting: Thoracic Surgery (Cardiothoracic Vascular Surgery)

## 2017-11-13 ENCOUNTER — Other Ambulatory Visit: Payer: Self-pay

## 2017-11-13 ENCOUNTER — Ambulatory Visit (HOSPITAL_BASED_OUTPATIENT_CLINIC_OR_DEPARTMENT_OTHER)
Admission: RE | Admit: 2017-11-13 | Discharge: 2017-11-13 | Disposition: A | Discharge status: Home / Self Care | Payer: Medicare HMO | Source: Ambulatory Visit | Attending: Thoracic Surgery (Cardiothoracic Vascular Surgery) | Admitting: Thoracic Surgery (Cardiothoracic Vascular Surgery)

## 2017-11-13 VITALS — BP 131/69 | HR 58 | Ht 69.0 in | Wt 232.0 lb

## 2017-11-13 DIAGNOSIS — I2510 Atherosclerotic heart disease of native coronary artery without angina pectoris: Secondary | ICD-10-CM

## 2017-11-13 DIAGNOSIS — I350 Nonrheumatic aortic (valve) stenosis: Secondary | ICD-10-CM

## 2017-11-13 DIAGNOSIS — I080 Rheumatic disorders of both mitral and aortic valves: Secondary | ICD-10-CM | POA: Diagnosis present

## 2017-11-13 DIAGNOSIS — F419 Anxiety disorder, unspecified: Secondary | ICD-10-CM | POA: Diagnosis present

## 2017-11-13 DIAGNOSIS — I481 Persistent atrial fibrillation: Secondary | ICD-10-CM | POA: Diagnosis not present

## 2017-11-13 DIAGNOSIS — F329 Major depressive disorder, single episode, unspecified: Secondary | ICD-10-CM | POA: Diagnosis present

## 2017-11-13 DIAGNOSIS — I700 Atherosclerosis of aorta: Secondary | ICD-10-CM | POA: Insufficient documentation

## 2017-11-13 DIAGNOSIS — I4819 Other persistent atrial fibrillation: Secondary | ICD-10-CM

## 2017-11-13 DIAGNOSIS — I69354 Hemiplegia and hemiparesis following cerebral infarction affecting left non-dominant side: Secondary | ICD-10-CM | POA: Diagnosis not present

## 2017-11-13 DIAGNOSIS — I779 Disorder of arteries and arterioles, unspecified: Secondary | ICD-10-CM | POA: Insufficient documentation

## 2017-11-13 DIAGNOSIS — Z79899 Other long term (current) drug therapy: Secondary | ICD-10-CM | POA: Insufficient documentation

## 2017-11-13 DIAGNOSIS — R918 Other nonspecific abnormal finding of lung field: Secondary | ICD-10-CM | POA: Diagnosis not present

## 2017-11-13 DIAGNOSIS — G4733 Obstructive sleep apnea (adult) (pediatric): Secondary | ICD-10-CM | POA: Diagnosis present

## 2017-11-13 DIAGNOSIS — I480 Paroxysmal atrial fibrillation: Secondary | ICD-10-CM | POA: Diagnosis present

## 2017-11-13 DIAGNOSIS — Z01812 Encounter for preprocedural laboratory examination: Secondary | ICD-10-CM

## 2017-11-13 DIAGNOSIS — K219 Gastro-esophageal reflux disease without esophagitis: Secondary | ICD-10-CM

## 2017-11-13 DIAGNOSIS — Y92230 Patient room in hospital as the place of occurrence of the external cause: Secondary | ICD-10-CM | POA: Diagnosis not present

## 2017-11-13 DIAGNOSIS — I062 Rheumatic aortic stenosis with insufficiency: Secondary | ICD-10-CM | POA: Diagnosis not present

## 2017-11-13 DIAGNOSIS — I340 Nonrheumatic mitral (valve) insufficiency: Secondary | ICD-10-CM

## 2017-11-13 DIAGNOSIS — D62 Acute posthemorrhagic anemia: Secondary | ICD-10-CM | POA: Diagnosis not present

## 2017-11-13 DIAGNOSIS — Z01810 Encounter for preprocedural cardiovascular examination: Secondary | ICD-10-CM | POA: Insufficient documentation

## 2017-11-13 DIAGNOSIS — Z95820 Peripheral vascular angioplasty status with implants and grafts: Secondary | ICD-10-CM | POA: Diagnosis not present

## 2017-11-13 DIAGNOSIS — N289 Disorder of kidney and ureter, unspecified: Secondary | ICD-10-CM | POA: Insufficient documentation

## 2017-11-13 DIAGNOSIS — R931 Abnormal findings on diagnostic imaging of heart and coronary circulation: Secondary | ICD-10-CM | POA: Diagnosis not present

## 2017-11-13 DIAGNOSIS — E1122 Type 2 diabetes mellitus with diabetic chronic kidney disease: Secondary | ICD-10-CM | POA: Diagnosis present

## 2017-11-13 DIAGNOSIS — Z8673 Personal history of transient ischemic attack (TIA), and cerebral infarction without residual deficits: Secondary | ICD-10-CM

## 2017-11-13 DIAGNOSIS — J439 Emphysema, unspecified: Secondary | ICD-10-CM | POA: Diagnosis present

## 2017-11-13 DIAGNOSIS — E875 Hyperkalemia: Secondary | ICD-10-CM | POA: Diagnosis not present

## 2017-11-13 DIAGNOSIS — I442 Atrioventricular block, complete: Secondary | ICD-10-CM | POA: Diagnosis not present

## 2017-11-13 DIAGNOSIS — I4891 Unspecified atrial fibrillation: Secondary | ICD-10-CM | POA: Diagnosis not present

## 2017-11-13 DIAGNOSIS — Z91030 Bee allergy status: Secondary | ICD-10-CM | POA: Diagnosis not present

## 2017-11-13 DIAGNOSIS — Z87891 Personal history of nicotine dependence: Secondary | ICD-10-CM | POA: Insufficient documentation

## 2017-11-13 DIAGNOSIS — D638 Anemia in other chronic diseases classified elsewhere: Secondary | ICD-10-CM

## 2017-11-13 DIAGNOSIS — N183 Chronic kidney disease, stage 3 (moderate): Secondary | ICD-10-CM | POA: Diagnosis not present

## 2017-11-13 DIAGNOSIS — E876 Hypokalemia: Secondary | ICD-10-CM | POA: Diagnosis not present

## 2017-11-13 DIAGNOSIS — I351 Nonrheumatic aortic (valve) insufficiency: Secondary | ICD-10-CM

## 2017-11-13 DIAGNOSIS — J9811 Atelectasis: Secondary | ICD-10-CM | POA: Diagnosis not present

## 2017-11-13 DIAGNOSIS — Z01818 Encounter for other preprocedural examination: Secondary | ICD-10-CM | POA: Insufficient documentation

## 2017-11-13 DIAGNOSIS — Z452 Encounter for adjustment and management of vascular access device: Secondary | ICD-10-CM | POA: Diagnosis not present

## 2017-11-13 DIAGNOSIS — I083 Combined rheumatic disorders of mitral, aortic and tricuspid valves: Secondary | ICD-10-CM | POA: Diagnosis not present

## 2017-11-13 DIAGNOSIS — R1310 Dysphagia, unspecified: Secondary | ICD-10-CM | POA: Diagnosis present

## 2017-11-13 DIAGNOSIS — I130 Hypertensive heart and chronic kidney disease with heart failure and stage 1 through stage 4 chronic kidney disease, or unspecified chronic kidney disease: Secondary | ICD-10-CM | POA: Diagnosis not present

## 2017-11-13 DIAGNOSIS — J449 Chronic obstructive pulmonary disease, unspecified: Secondary | ICD-10-CM | POA: Insufficient documentation

## 2017-11-13 DIAGNOSIS — I358 Other nonrheumatic aortic valve disorders: Secondary | ICD-10-CM | POA: Diagnosis not present

## 2017-11-13 DIAGNOSIS — J90 Pleural effusion, not elsewhere classified: Secondary | ICD-10-CM | POA: Diagnosis not present

## 2017-11-13 DIAGNOSIS — I359 Nonrheumatic aortic valve disorder, unspecified: Secondary | ICD-10-CM | POA: Diagnosis not present

## 2017-11-13 DIAGNOSIS — I25810 Atherosclerosis of coronary artery bypass graft(s) without angina pectoris: Secondary | ICD-10-CM | POA: Diagnosis not present

## 2017-11-13 DIAGNOSIS — N170 Acute kidney failure with tubular necrosis: Secondary | ICD-10-CM | POA: Diagnosis not present

## 2017-11-13 DIAGNOSIS — E785 Hyperlipidemia, unspecified: Secondary | ICD-10-CM | POA: Diagnosis present

## 2017-11-13 DIAGNOSIS — I252 Old myocardial infarction: Secondary | ICD-10-CM | POA: Diagnosis not present

## 2017-11-13 DIAGNOSIS — I5033 Acute on chronic diastolic (congestive) heart failure: Secondary | ICD-10-CM | POA: Diagnosis not present

## 2017-11-13 DIAGNOSIS — R079 Chest pain, unspecified: Secondary | ICD-10-CM | POA: Diagnosis not present

## 2017-11-13 HISTORY — DX: Nonrheumatic mitral (valve) insufficiency: I34.0

## 2017-11-13 HISTORY — DX: Emphysema, unspecified: J43.9

## 2017-11-13 HISTORY — DX: Chronic obstructive pulmonary disease, unspecified: J44.9

## 2017-11-13 HISTORY — DX: Acute myocardial infarction, unspecified: I21.9

## 2017-11-13 LAB — CBC
HEMATOCRIT: 37.6 % — AB (ref 39.0–52.0)
Hemoglobin: 12.1 g/dL — ABNORMAL LOW (ref 13.0–17.0)
MCH: 31.4 pg (ref 26.0–34.0)
MCHC: 32.2 g/dL (ref 30.0–36.0)
MCV: 97.7 fL (ref 78.0–100.0)
Platelets: 278 10*3/uL (ref 150–400)
RBC: 3.85 MIL/uL — AB (ref 4.22–5.81)
RDW: 14.3 % (ref 11.5–15.5)
WBC: 6.8 10*3/uL (ref 4.0–10.5)

## 2017-11-13 LAB — COMPREHENSIVE METABOLIC PANEL
ALBUMIN: 4 g/dL (ref 3.5–5.0)
ALT: 10 U/L — AB (ref 17–63)
AST: 15 U/L (ref 15–41)
Alkaline Phosphatase: 37 U/L — ABNORMAL LOW (ref 38–126)
Anion gap: 9 (ref 5–15)
BILIRUBIN TOTAL: 0.7 mg/dL (ref 0.3–1.2)
BUN: 22 mg/dL — AB (ref 6–20)
CHLORIDE: 109 mmol/L (ref 101–111)
CO2: 20 mmol/L — ABNORMAL LOW (ref 22–32)
CREATININE: 1.47 mg/dL — AB (ref 0.61–1.24)
Calcium: 9.1 mg/dL (ref 8.9–10.3)
GFR calc Af Amer: 54 mL/min — ABNORMAL LOW (ref >60)
GFR, EST NON AFRICAN AMERICAN: 46 mL/min — AB (ref >60)
GLUCOSE: 112 mg/dL — AB (ref 65–99)
POTASSIUM: 4.3 mmol/L (ref 3.5–5.1)
Sodium: 138 mmol/L (ref 135–145)
TOTAL PROTEIN: 6.9 g/dL (ref 6.5–8.1)

## 2017-11-13 LAB — URINALYSIS, ROUTINE W REFLEX MICROSCOPIC
Bilirubin Urine: NEGATIVE
GLUCOSE, UA: NEGATIVE mg/dL
HGB URINE DIPSTICK: NEGATIVE
KETONES UR: NEGATIVE mg/dL
Leukocytes, UA: NEGATIVE
Nitrite: NEGATIVE
PROTEIN: NEGATIVE mg/dL
Specific Gravity, Urine: 1.01 (ref 1.005–1.030)
pH: 5 (ref 5.0–8.0)

## 2017-11-13 LAB — HEMOGLOBIN A1C
HEMOGLOBIN A1C: 6 % — AB (ref 4.8–5.6)
Mean Plasma Glucose: 125.5 mg/dL

## 2017-11-13 LAB — BLOOD GAS, ARTERIAL
Acid-base deficit: 1.8 mmol/L (ref 0.0–2.0)
Bicarbonate: 22.4 mmol/L (ref 20.0–28.0)
DRAWN BY: 449841
FIO2: 0.21
O2 Saturation: 93.5 %
PCO2 ART: 37.1 mmHg (ref 32.0–48.0)
PH ART: 7.397 (ref 7.350–7.450)
Patient temperature: 98.6
pO2, Arterial: 70.2 mmHg — ABNORMAL LOW (ref 83.0–108.0)

## 2017-11-13 LAB — SURGICAL PCR SCREEN
MRSA, PCR: NEGATIVE
Staphylococcus aureus: POSITIVE — AB

## 2017-11-13 LAB — GLUCOSE, CAPILLARY: Glucose-Capillary: 126 mg/dL — ABNORMAL HIGH (ref 65–99)

## 2017-11-13 LAB — PROTIME-INR
INR: 1.11
Prothrombin Time: 14.2 seconds (ref 11.4–15.2)

## 2017-11-13 LAB — APTT: aPTT: 40 seconds — ABNORMAL HIGH (ref 24–36)

## 2017-11-13 NOTE — Progress Notes (Signed)
[address]       [place] [address]             [phone]     CARDIOTHORACIC SURGERY OFFICE NOTE  Referring Provider is Larey Dresser, MD PCP is Maurice Small, MD   HPI:  Patient returns the office today for follow-up of aortic stenosis with aortic insufficiency, mitral regurgitation, recurrent paroxysmal atrial fibrillation, and coronary artery disease with tentative plans to proceed with surgery later this week.  He was originally seen in consultation on September 15, 2017 and he was seen more recently on October 09, 2017.  He reports no new problems or complaints over the past month.  In fact, he has continued to make steady improvement in both he and his wife state that his exercise tolerance is now essentially back to how it was prior to his hospitalization last August.  He stopped taking Eliquis in anticipation of surgery as previously planned.  He was examined by his dentist and plans to go extraction of a single loose tooth tomorrow.  He is looking forward to getting his surgery behind him.   Current Outpatient Medications  Medication Sig Dispense Refill  . amiodarone (PACERONE) 200 MG tablet Take 1 tablet (200 mg total) by mouth daily. 90 tablet 3  . apixaban (ELIQUIS) 5 MG TABS tablet Take 1 tablet (5 mg total) by mouth 2 (two) times daily. 180 tablet 3  . calcium carbonate (TUMS - DOSED IN MG ELEMENTAL CALCIUM) 500 MG chewable tablet Chew 1 tablet by mouth daily as needed for indigestion or heartburn.    . cetirizine (ZYRTEC) 10 MG tablet Take 10 mg by mouth daily as needed for allergies.    . citalopram (CELEXA) 20 MG tablet Take 1 tablet (20 mg total) by mouth at bedtime. 30 tablet 5  . docusate sodium (COLACE) 100 MG capsule Take 1 capsule (100 mg total) by mouth 3 (three) times daily as needed. 20 capsule 0  . ferrous sulfate 325 (65 FE) MG tablet Take 1 tablet (325 mg total) by mouth daily with breakfast. 30 tablet 3  . furosemide (LASIX) 40 MG  tablet Take 1 tablet (40 mg total) by mouth daily. 90 tablet 3  . lisinopril (PRINIVIL,ZESTRIL) 10 MG tablet Take 1 tablet (10 mg total) by mouth daily. 90 tablet 3  . LORazepam (ATIVAN) 0.5 MG tablet Take 1 tablet (0.5 mg total) by mouth every 6 (six) hours as needed for anxiety (aggitation). 20 tablet 0  . lovastatin (MEVACOR) 40 MG tablet Take 1 tablet (40 mg total) by mouth at bedtime. 30 tablet 3  . nitroGLYCERIN (NITROSTAT) 0.4 MG SL tablet Place 1 tablet (0.4 mg total) under the tongue every 5 (five) minutes x 3 doses as needed for chest pain. 30 tablet 12  . ondansetron (ZOFRAN) 4 MG tablet Take 4 mg by mouth every 8 (eight) hours as needed for nausea or vomiting.    [name] oxyCODONE-acetaminophen (ROXICET) 5-325 MG tablet Take 1-2 tablets by mouth every 4 (four) hours as needed for severe pain. 30 tablet 0  . pantoprazole (PROTONIX) 20 MG tablet Take 1 tablet (20 mg total) by mouth daily. 30 tablet 5  . potassium chloride SA (K-DUR,KLOR-CON) 20 MEQ tablet Take 1 tablet (20 mEq total) by mouth daily. 90 tablet 3  . traZODone (DESYREL) 50 MG tablet Take 1 tablet (50 mg total) by mouth at bedtime. 30 tablet 5   No current facility-administered medications for this visit.  Physical Exam:   BP 131/69 (BP Location: Left Arm, Patient Position: Sitting, Cuff Size: Normal)   Pulse (!) 58   Ht 5\' 9"  (1.753 m)   Wt 232 lb (105.2 kg)   SpO2 96%   BMI 34.26 kg/m   General:  Obese but well-appearing  Chest:   Clear to auscultation  CV:   Regular rate and rhythm with systolic murmur  Incisions:  n/a  Abdomen:  Soft nontender  Extremities:  Warm and well perfused  Diagnostic Tests:  n/a   Impression:  Patient has mild to moderate aortic stenosis with moderate to severe aortic insufficiency and severe mitral regurgitation. He presented with a progression of symptoms of exertional shortness of breath consistent with chronic diastolic congestive heart failure followed by sudden acute  presentation with acute hypoxemic respiratory failure in the setting of acute on chronic diastolic congestive heart failure in the setting ofrapid atrial fibrillation this past August. His acute presentation was complicated by ventilator-dependent respiratory failure hypotension requiring pressors, acute renal failure, and severe confusion and delirium, all of which has resolved. He appears to have essentially recovered from his acute illness. At present he describes stable symptoms of exertional shortness of breath and fatigue consistent with chronic diastolic congestive heart failure, New York Heart Association functional class I-II. He has not had recurrent palpitations and appears to be maintaining sinus rhythm on oral amiodarone. His physical strength has improved and reportedly essentially back to his baseline.  I have personally reviewed the patient's most recent transthoracic and transesophageal echocardiograms, diagnostic cardiac catheterization, and CT angiogram. The aortic valve is trileaflet with mild sclerosis and restricted leaflet mobility. There is moderate to severe central aortic insufficiency. This may be rheumatic in etiology, and I do not feel that the aortic valve is repairable. There is type III dysfunction of the mitral valve with severe restriction involving primarily the posterior leaflet. The mitral valve is mildly thickened. This may be ischemic and secondary to previous inferior wall myocardial infarction, although the possibility of rheumatic disease cannot be definitively ruled out. I remain hopeful that the mitral valve may be repairable, but mitral valve replacement may be necessary. 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 The terminal branches of the right coronary artery may or may not be graftable  target vessels for surgical revascularization.  CT angiography reveals no complicating features. The aortic annulus appears large enough for implantation of a 21 mm stented bioprosthetic tissue valve.  I agree the patient would probably best be treated with aortic valve replacement, mitral valve repair or replacement, Maze procedure, and coronary artery bypass grafting. However, risks associated with surgery will be extremely high in the patient's postoperative convalescence may be prolonged even the absence of any significant complications. Alternatively, edge to edge palliative mitral clip could be considered with continued medical therapy for the patient's aortic valve disease, atrial fibrillation, and coronary artery disease. Continued medical therapy without some type of intervention be associated with relatively poor prognosis. Although the mitral clip might be better than continued medical therapy, long-term prognosis would clearly be best with definitive surgical intervention.   Plan:  I have again reviewed treatment alternatives with the patient and his wife in the office today.  We discussed the high risks associated with surgical intervention and long-term prognosis with continued medical therapy without surgery.    Discussion was held comparing the  relative risks of mechanical valve replacement with need for lifelong anticoagulation versus use of a bioprosthetic tissue valve and the associated potential for late structural valve deterioration and failure.  This discussion was placed in the context of the patient's particular circumstances, and as a result the patient specifically requests that their valve be replaced using bioprosthetic tissue valves.   The patient understands and accepts all potential associated risks of surgery including but not limited to risk of death, stroke, myocardial infarction, congestive heart failure, respiratory failure, renal failure, pneumonia, bleeding requiring  blood transfusion and or reexploration, arrhythmia, heart block or bradycardia requiring permanent pacemaker, aortic dissection or other major vascular complication, pleural effusions or other delayed complications related to continued congestive heart failure, and other late complications related to valve replacement including structural valve deterioration and failure, thrombosis, endocarditis, or paravalvular leak and/or late recurrence of symptomatic ischemic heart disease.   I spent in excess of 15 minutes during the conduct of this office consultation and >50% of this time involved direct face-to-face encounter with the patient for counseling and/or coordination of their care.    Valentina Gu. Roxy Manns, MD 11/13/2017 11:46 AM

## 2017-11-13 NOTE — Progress Notes (Signed)
Pt denies any acute cardiopulmonary issues. Pt under the care of Dr. Aundra Dubin, Cardiology. Pt stated that his last dose of Eliquis was last Wednesday (per MD). Pt chart forwarded to anesthesia for review.

## 2017-11-13 NOTE — Pre-Procedure Instructions (Signed)
Brandon [name]  11/13/2017      [place], [place] [address] Phone: [phone] Fax: [phone]  [facility] [place], [place] - [address] [place] [address] [place] [address] [place] [address] Phone: [phone] Fax: [phone]    Your procedure is scheduled on Thursday, November 16, 2017  Report to [place] at 5:30 A.M.  Call this number if you have problems the morning of surgery:  [phone]   Remember:  Do not eat food or drink liquids after midnight Wednesday, December 5  Take these medicines the morning of surgery with A SIP OF WATER : amiodarone (PACERONE),  pantoprazole (PROTONIX), if needed: oxyCODONE for pain, ondansetron (ZOFRAN) for nausea or vomiting, ATIVAN) for anxiety (aggitation), cetirizine (ZYRTEC) for allergies, nitroGLYCERIN (NITROSTAT) for chest pain Stop taking vitamins, fish oil and herbal medications. Do not take any NSAIDs ie: Ibuprofen, Advil, Naproxen ( Aleve), Motrin, BC and Goody Powder; stop now.  Do not wear jewelry, make-up or nail polish.  Do not wear lotions, powders, or perfumes, or deodorant.  Do not shave 48 hours prior to surgery.  Men may shave face and neck.  Do not bring valuables to the hospital.  [facility] is not responsible for any belongings or valuables.  Contacts, dentures or bridgework may not be worn into surgery.  Leave your suitcase in the car.  After surgery it may be brought to your room. For patients admitted to the hospital, discharge time will be determined by your treatment team.   Special Instructions: [place] - Preparing for Surgery  Before surgery, you can play an important role.  Because skin is not sterile, your skin needs to be as free of germs as possible.  You can reduce the number of germs on you skin by washing with CHG (chlorahexidine gluconate)  soap before surgery.  CHG is an antiseptic cleaner which kills germs and bonds with the skin to continue killing germs even after washing.  Please DO NOT use if you have an allergy to CHG or antibacterial soaps.  If your skin becomes reddened/irritated stop using the CHG and inform your nurse when you arrive at Short Stay.  Do not shave (including legs and underarms) for at least 48 hours prior to the first CHG shower.  You may shave your face.  Please follow these instructions carefully:   1.  Shower with CHG Soap the night before surgery and the morning of Surgery.  2.  If you choose to wash your hair, wash your hair first as usual with your normal shampoo.  3.  After you shampoo, rinse your hair and body thoroughly to remove the Shampoo.  4.  Use CHG as you would any other liquid soap.  You can apply chg directly  to the skin and wash gently with scrungie or a clean washcloth.  5.  Apply the CHG Soap to your body ONLY FROM THE NECK DOWN.  Do not use on open wounds or open sores.  Avoid contact with your eyes, ears, mouth and genitals (private parts).  Wash genitals (private parts) with your normal soap.  6.  Wash thoroughly, paying special attention to the area where your surgery will be performed.  7.  Thoroughly rinse your body with warm water from the neck down.  8.  DO NOT shower/wash with  your normal soap after using and rinsing off the CHG Soap.  9.  Pat yourself dry with a clean towel.            10.  Wear clean pajamas.            11.  Place clean sheets on your bed the night of your first shower and do not sleep with pets.  Day of Surgery  Do not apply any lotions/deodorants the morning of surgery.  Please wear clean clothes to the hospital/surgery center. Please read over the following fact sheets that you were given. Pain Booklet, Coughing and Deep Breathing, Blood Transfusion Information, MRSA Information and Surgical Site Infection Prevention

## 2017-11-13 NOTE — Patient Instructions (Signed)
Do not take Eliquis  Continue taking all other medications without change through the day before surgery.  Have nothing to eat or drink after midnight the night before surgery.  On the morning of surgery take only Protonix with a sip of water.

## 2017-11-13 NOTE — Progress Notes (Signed)
Pre-op Cardiac Surgery  Carotid Findings: Bilateral 1-39% ICA stenosis, with moderate to severe diffuse heterogeneous plaque.  Right sided stent appears patent.  Antegrade vertebral flow bilaterally.  Upper Extremity Right Left  Brachial Pressures 127, Tri 136, Tri  Radial Waveforms Bi Bi  Ulnar Waveforms Bi Bi  Palmar Arch (Allen's Test) Waveform increases slightly with radial compression and decreases greater than 50% with ulnar compression   Waveform increases greater than 50% with radial compression and decreases greater than 50% with ulnar compression     Lower  Extremity Right Left  Dorsalis Pedis 66, Mono 134, Mono  Posterior Tibial 82, Mono 119, Bi  Ankle/Brachial Indices 0.6 0.9   Findings:  Right ABI indicates moderate PAD, Left ABI indicates mild PAD.  [place], RVT 4:54 PM  11/13/2017

## 2017-11-13 NOTE — Pre-Procedure Instructions (Signed)
Brandon [name]  11/13/2017      [place], [place] [address] Phone: [phone] Fax: [phone]  [facility] [place], [place] - [address] [place] [address] [place] [address] [place] [address] [place] [address] Phone: [phone] Fax: [phone]    Your procedure is scheduled on Thursday, November 16, 2017  Report to [place] at 5:30 A.M.  Call this number if you have problems the morning of surgery:  [phone]   Remember: Follow doctors instructions regarding apixaban Brandon [name])   Do not eat food or drink liquids after midnight Wednesday, December 5  Take these medicines the morning of surgery with A SIP OF WATER : amiodarone (PACERONE),  pantoprazole (PROTONIX), if needed: oxyCODONE for pain, ondansetron (ZOFRAN) for nausea or vomiting, ATIVAN) for anxiety (aggitation), cetirizine (ZYRTEC) for allergies, nitroGLYCERIN (NITROSTAT) for chest pain Stop taking vitamins, fish oil and herbal medications. Do not take any NSAIDs ie: Ibuprofen, Advil, Naproxen ( Aleve), Motrin, BC and Goody Powder; stop now.  Do not wear jewelry, make-up or nail polish.  Do not wear lotions, powders, or perfumes, or deodorant.  Do not shave 48 hours prior to surgery.  Men may shave face and neck.  Do not bring valuables to the hospital.  [facility] is not responsible for any belongings or valuables.  Contacts, dentures or bridgework may not be worn into surgery.  Leave your suitcase in the car.  After surgery it may be brought to your room. For patients admitted to the hospital, discharge time will be determined by your treatment team.   Special Instructions: [place] [name] - Preparing for Surgery  Before surgery, you can play an important role.  Because skin is not sterile, your skin needs to be as free of germs as possible.  You can reduce the number of germs on  you skin by washing with CHG (chlorahexidine gluconate) soap before surgery.  CHG is an antiseptic cleaner which kills germs and bonds with the skin to continue killing germs even after washing.  Please DO NOT use if you have an allergy to CHG or antibacterial soaps.  If your skin becomes reddened/irritated stop using the CHG and inform your nurse when you arrive at Short Stay.  Do not shave (including legs and underarms) for at least 48 hours prior to the first CHG shower.  You may shave your face.  Please follow these instructions carefully:   1.  Shower with CHG Soap the night before surgery and the morning of Surgery.  2.  If you choose to wash your hair, wash your hair first as usual with your normal shampoo.  3.  After you shampoo, rinse your hair and body thoroughly to remove the Shampoo.  4.  Use CHG as you would any other liquid soap.  You can apply chg directly  to the skin and wash gently with scrungie or a clean washcloth.  5.  Apply the CHG Soap to your body ONLY FROM THE NECK DOWN.  Do not use on open wounds or open sores.  Avoid contact with your eyes, ears, mouth and genitals (private parts).  Wash genitals (private parts) with your normal soap.  6.  Wash thoroughly, paying special attention to the area where your surgery will be performed.  7.  Thoroughly rinse your body with warm water from the neck down.  8.  DO NOT shower/wash with your normal soap after using and rinsing off the CHG Soap.  9.  Pat yourself dry with a clean towel.            10.  Wear clean pajamas.            11.  Place clean sheets on your bed the night of your first shower and do not sleep with pets.  Day of Surgery  Do not apply any lotions/deodorants the morning of surgery.  Please wear clean clothes to the hospital/surgery center. Please read over the following fact sheets that you were given. Pain Booklet, Coughing and Deep Breathing, Blood Transfusion Information, MRSA Information and Surgical Site  Infection Prevention

## 2017-11-14 NOTE — Progress Notes (Signed)
Anesthesia Chart Review:  Pt is a 71 year old male scheduled for aortic valve replacement, mitral valve repair or replacement, CABG, MAZE on 11/16/2017 with Darylene Price, MD.   - PCP is Maurice Small, MD - Cardiologist is Dorris Carnes, M.D.  - HF cardiologist is Loralie Champagne, MD  PMH includes: CAD, PAF, stroke (2004), carotid stenosis (s/p R ICA stent 2004), HTN, aortic stenosis, moderate to severe mitral regurgitation, OSA, COPD, dyslipidemia, GERD.  - Former smoker (quit 07/13/17). BMI 28.  - S/p L shoulder arthroscopy 05/18/17. S/P cholecystectomy 06/01/13.  - Hospitalized 8/2-8/27/18 for acute hypoxemic respiratory failure (mix of pulmonary edema, pneumonia, COPD flare) requiring intubation, acute on chronic diastolic CHF. Complicated by cardiogenic and vasodilatory shock, acute kidney injury, new on set PAF, anemia (1 unit PRBCs), delirium (no acute stroke on imaging)  Medications include: amiodarone, eliquis, iron, lasix, lisinopril, lovastatin, Prilosec, potassium. Last dose eliquis 11/08/17.   Preoperative labs reviewed.  - HbA1c 6.0, glucose 112 - PT normal, PTT 40 - Dr. Roxy Manns has reviewed lab results.   CXR 11/13/17:  - Top-normal cardiac size. Currently no pulmonary vascular congestion and no evidence of pneumonia. - Thoracic aortic atherosclerosis.  EKG 11/13/17: NSR. Septal infarct, age undetermined  [place] pre-CABG 11/13/17:  - Right Carotid: There is evidence in the right ICA of a 1-39% stenosis. Patent carotid stent. - Left Carotid: There is evidence in the left ICA of a 1-39% stenosis. - Vertebrals: Both vertebral arteries were patent with antegrade flow. - Right ABI: Resting right ankle-brachial index indicates moderate right lower extremity arterial disease. - Left ABI: Resting left ankle-brachial index indicates mild right lower extremity arterial disease.  CT coronary morphology 09/28/17:  1. Trileaflet aortic valve with severely thickened and mildly calcified leaflets  with non-coaptation in diastole. 2. Aorta has normal size, no dissection, mild diffuse calcifications predominantly in the aortic arch and in the sinotubular junction. 3. Mitral valve appears thickened with no prolapse. There are mild posterior mitral annular calcifications. 4.  No thrombus in the left atrial appendage. 5. Mildly dilated pulmonary artery measuring 34 x 30 mm suggestive of pulmonary hypertension.  TEE 08/30/17:  - Left ventricle: Mildly dilated LV with basal to mid inferolateral hypokinesis and basal inferior akinesis. EF 50%. - Aortic valve: The aortic valve was moderately calcified. Mean gradient 22 mmHg with AVA 1.46 cm^2 suggests moderate stenosis. Moderate to severe aortic insufficiency with jet width/LVOT width 0.60. Trileaflet. - Aorta: Normal caliber aorta with mild plaque in the descending thoracic aorta. - Mitral valve: The posterior leaflet of the mitral valve is restricted, there is mitral regurgitation that appears to be severe (PISA ERO 0.4 cm^2 and MR regurgitant volume 80 cc). There was no systolic flow reversal in the pulmonary veins. - Left atrium: The atrium was moderately dilated. No evidence of thrombus in the atrial cavity or appendage. - Right ventricle: The cavity size was normal. Systolic function was normal. - Right atrium: No evidence of thrombus in the atrial cavity or appendage. - Atrial septum: No defect or patent foramen ovale was identified. Echo contrast study showed no right-to-left atrial level shunt, at baseline or with provocation. - Impressions:  - 1. LV mildly dilated with EF 50% with wall motion abnormalities noted above.   2. Suspect severe mitral regurgitation. There is restriction of the posterior leaflet, possible infarct-related MR given wall motion abnormalities.   3. Moderate aortic stenosis with moderate to severe aortic insufficiency.  R/L cardiac cath 08/30/17:  1. Normal right and left  heart filling pressures and preserved cardiac  output.  2. Moderate aortic stenosis by mean gradient.  Aortic root shot for aortic insufficiency was not done to save contrast, had TEE today earlier.  3. 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  The larger vessel supplying the PDA was totally occluded in the mid vessel with collaterals from the left system.  The small vessel supplying RV/PLV territory was patent.  The left coronary system did not have significant disease.   If no changes, I anticipate pt can proceed with surgery as scheduled.   Willeen Cass, FNP-BC [facility] Short Stay Surgical Center/Anesthesiology Phone: [phone] 11/14/2017 10:41 AM

## 2017-11-15 ENCOUNTER — Encounter (HOSPITAL_COMMUNITY): Payer: Self-pay | Admitting: Certified Registered Nurse Anesthetist

## 2017-11-15 MED ORDER — VANCOMYCIN HCL 10 G IV SOLR
1250.0000 mg | INTRAVENOUS | Status: DC
  Filled 2017-11-15: qty 1250

## 2017-11-15 MED ORDER — KENNESTONE BLOOD CARDIOPLEGIA (KBC) MANNITOL SYRINGE (20%, 32ML)
32.0000 mL | Freq: Once | INTRAVENOUS | Status: DC
  Filled 2017-11-15 (×3): qty 32

## 2017-11-15 MED ORDER — SODIUM CHLORIDE 0.9 % IV SOLN
INTRAVENOUS | Status: DC
  Filled 2017-11-15: qty 30

## 2017-11-15 MED ORDER — VANCOMYCIN HCL 1000 MG IV SOLR
INTRAVENOUS | Status: AC
  Administered 2017-11-16: 1000 mL
  Filled 2017-11-15: qty 1000

## 2017-11-15 MED ORDER — TRANEXAMIC ACID 1000 MG/10ML IV SOLN
1.5000 mg/kg/h | INTRAVENOUS | Status: AC
  Administered 2017-11-16: 1.5 mg/kg/h via INTRAVENOUS
  Filled 2017-11-15: qty 25

## 2017-11-15 MED ORDER — TRANEXAMIC ACID (OHS) PUMP PRIME SOLUTION
2.0000 mg/kg | INTRAVENOUS | Status: DC
  Filled 2017-11-15: qty 1.7

## 2017-11-15 MED ORDER — MAGNESIUM SULFATE 50 % IJ SOLN
40.0000 meq | INTRAMUSCULAR | Status: DC
  Filled 2017-11-15: qty 9.85

## 2017-11-15 MED ORDER — KENNESTONE BLOOD CARDIOPLEGIA VIAL
13.0000 mL | Freq: Once | Status: DC
  Filled 2017-11-15 (×3): qty 13

## 2017-11-15 MED ORDER — PLASMA-LYTE 148 IV SOLN
INTRAVENOUS | Status: AC
  Administered 2017-11-16: 500 mL
  Filled 2017-11-15: qty 2.5

## 2017-11-15 MED ORDER — DEXTROSE 5 % IV SOLN
750.0000 mg | INTRAVENOUS | Status: DC
  Filled 2017-11-15: qty 750

## 2017-11-15 MED ORDER — NITROGLYCERIN IN D5W 200-5 MCG/ML-% IV SOLN
2.0000 ug/min | INTRAVENOUS | Status: AC
  Administered 2017-11-16: 16.6 ug/min via INTRAVENOUS
  Filled 2017-11-15: qty 250

## 2017-11-15 MED ORDER — DEXTROSE 5 % IV SOLN
1.5000 g | INTRAVENOUS | Status: DC
  Filled 2017-11-15 (×2): qty 1.5

## 2017-11-15 MED ORDER — SODIUM CHLORIDE 0.9 % IV SOLN
INTRAVENOUS | Status: AC
  Administered 2017-11-16: 1 [IU]/h via INTRAVENOUS
  Filled 2017-11-15: qty 1

## 2017-11-15 MED ORDER — EPINEPHRINE PF 1 MG/ML IJ SOLN
0.0000 ug/min | INTRAVENOUS | Status: DC
  Filled 2017-11-15: qty 4

## 2017-11-15 MED ORDER — DEXMEDETOMIDINE HCL IN NACL 400 MCG/100ML IV SOLN
0.1000 ug/kg/h | INTRAVENOUS | Status: AC
  Administered 2017-11-16: .3 ug/kg/h via INTRAVENOUS
  Filled 2017-11-15: qty 100

## 2017-11-15 MED ORDER — POTASSIUM CHLORIDE 2 MEQ/ML IV SOLN
80.0000 meq | INTRAVENOUS | Status: DC
  Filled 2017-11-15: qty 40

## 2017-11-15 MED ORDER — SODIUM CHLORIDE 0.9 % IV SOLN
30.0000 ug/min | INTRAVENOUS | Status: AC
  Administered 2017-11-16: 25 ug/min via INTRAVENOUS
  Filled 2017-11-15: qty 2

## 2017-11-15 MED ORDER — TRANEXAMIC ACID (OHS) BOLUS VIA INFUSION
15.0000 mg/kg | INTRAVENOUS | Status: AC
  Administered 2017-11-16: 1278 mg via INTRAVENOUS
  Filled 2017-11-15: qty 1278

## 2017-11-15 MED ORDER — DOPAMINE-DEXTROSE 3.2-5 MG/ML-% IV SOLN
0.0000 ug/kg/min | INTRAVENOUS | Status: AC
  Administered 2017-11-16: 3 ug/kg/min via INTRAVENOUS
  Filled 2017-11-15: qty 250

## 2017-11-15 NOTE — H&P (Signed)
[address]       [place] [address]             [phone]          CARDIOTHORACIC SURGERY HISTORY AND PHYSICAL EXAM  Referring Provider is Larey Dresser, MD  Primary Cardiologist is Dorris Carnes, MD PCP is Maurice Small, MD      Chief Complaint  Patient presents with  . Coronary Artery Disease    Surgical eval, Cardiac Cath and TEE 08/30/17, ECHO , PFT's 09/01/17  . Aortic Insuffiency    and ATRIAL STENOSIS  . Mitral Regurgitation  . Atrial Fibrillation    PAROXSYSMAL    HPI:  Patient is a 71 year old moderately obese white male with history of cerebrovascular disease status post right hemispheric stroke in 2004, hypertension, aortic stenosis with aortic insufficiency, mitral regurgitation, and recurrent paroxysmal atrial fibrillation who has been referred for surgical consultation to discuss treatment options for management of his valvular heart disease and atrial fibrillation. The patient suffered a stroke in 2004 involving the right middle cerebral artery that was treated with catheter directed thrombo-lysis with remarkable success. He initially presented with severe left-sided paralysis, but he recovered from the stroke quite well with some mild residual problems with short-term memory loss.  At the time he was told that there were signs to suggest that he may have had a heart attack in the past and he was referred for cardiology consultation. He has been followed intermittently ever since by Dr. Harrington Challenger. He was noted to have a heart murmur on exam and echocardiograms revealed the presence of aortic stenosis with aortic insufficiency, mitral regurgitation, and mild left ventricular systolic dysfunction. He had remained clinically stable until last spring when he was scheduled for left shoulder surgery. Preoperative cardiac clearance was requested and both transthoracic and transesophageal echocardiograms were performed.  Echocardiograms revealed normal  left ventricular systolic function with mild to moderate aortic stenosis, moderate aortic insufficiency, and moderate mitral regurgitation. The patient underwent uncomplicated left shoulder reconstruction. During his immediate postoperative recovery the patient states that he experienced a 30 minute period of tachypalpitations associated with severe shortness of breath and chest discomfort. Symptoms resolved and did not recur. Over the next 2 months the patient did fairly well and went to physical rehabilitation for treatment of his shoulder surgery.  During that period of time he was noted to experience exertional shortness of breath, and he was admitted acutely in August with severe resting shortness breath and chest pressure associated with atrial fibrillation and rapid ventricular response. He developed acute hypoxemic respiratory failure requiring intubation for mechanical ventilatory support. He became hypotensive and required pressors for a period of time. He developed acute renal failure and pneumonia. He had a prolonged hospitalization but eventually recovered and was extubated. Because of his severe deconditioning he was discharged to the inpatient rehabilitation service for 2 weeks. He continues to gradually improve and was seen in follow-up by Dr. Aundra Dubin in the advanced heart failure clinic. He has been maintaining sinus rhythm on amiodarone and is anticoagulated using Eliquis.  Transesophageal echocardiogram and diagnostic cardiac catheterization were performed 08/30/2017. TEE revealed mild to moderate aortic stenosis with moderate to severe aortic insufficiency and severe mitral regurgitation.  There was mild left ventricular chamber enlargement with ejection fraction estimated 50%.  Diagnostic cardiac catheterization revealed a normal as coronary circulation with chronic occlusion of the right coronary artery and posterior descending coronary artery with left-to-right collaterals. There was no  significant disease in the left coronary system. Mean transvalvular gradient across the aortic valve was 23 mmHg. Pulmonary artery pressures were very mildly elevated. Patient was referred for surgical consultation.  The patient is married and lives with his wife in [place]. 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. He recovered from his stroke remarkably well and lives a fairly normal physical lifestyle up until recently. He states that he was doing well with his physical therapy and recovery from his shoulder surgery although he developed progressive shortness of breath followed by the acute onset of severe shortness of breath and chest discomfort with acute hypoxemic respiratory failure in the setting of atrial fibrillation with rapid ventricular response. Since his prolonged hospitalization the patient has been now at home for approximately one month. He is making good progress but states that he still not back to his baseline. His gait remains slightly unstable because of weakness and problems with balance. His breathing is stable although he does get short of breath with more strenuous exertion. He has not had resting shortness of breath, PND, orthopnea, palpitations, or lower extremity edema since hospital discharge. Appetite is stable. Strength is slowly improving.  Early after his hospitalization he had problems with dysphagia but this has resolved. During his acute illness he had problems with memory loss and delivery and but this has resolved.  Patient returns the office today for follow-up of aortic stenosis with aortic insufficiency, mitral regurgitation, recurrent paroxysmal atrial fibrillation, and coronary artery disease with tentative plans to proceed with surgery later this week.  He was originally seen in consultation on September 15, 2017 and he was seen more recently on October 09, 2017.  He reports  no new problems or complaints over the past month.  In fact, he has continued to make steady improvement in both he and his wife state that his exercise tolerance is now essentially back to how it was prior to his hospitalization last August.  He stopped taking Eliquis in anticipation of surgery as previously planned.  He was examined by his dentist and plans to go extraction of a single loose tooth tomorrow.  He is looking forward to getting his surgery behind him.  Past Medical History:  Diagnosis Date  . Anemia 07/13/2017  . Anxiety   . Aortic insufficiency   . Aortic stenosis, moderate 07/13/2017  . Arthritis    back   . Carotid stenosis    Right carotid stent (widely patent) 40 - 59% left plaque 11/13  . COPD (chronic obstructive pulmonary disease) ([facility])   . Coronary artery disease involving native coronary artery of native heart without angina pectoris   . Depression   . Dyslipidemia   . GERD (gastroesophageal reflux disease)   . Heart murmur   . Hemiplegia affecting unspecified side, late effect of cerebrovascular disease    resolved- from L side   . Hypertension   . Jaundice    resolved following ERCP & Cholecystectomy  . Mild emphysema ([facility])   . Mitral regurgitation   . Mitral valve insufficiency and aortic valve insufficiency   . Myocardial infarction ([facility])   . Paroxysmal atrial fibrillation ([facility])   . Pre-diabetes    per spouse  . Sleep apnea    does not wear CPAP  . Sleep concern    resulted in surgery- after + sleep test. Pt. doesn't have a problem any longer.   . Stroke ([facility]) 03/11/2003  stent placed on the 31, 3, 2004, L side   . Wears glasses   . Wears hearing aid in both ears   . Wears partial dentures     Past Surgical History:  Procedure Laterality Date  . BACK SURGERY     lumbar back  . CHOLECYSTECTOMY    . ERCP N/A 05/31/2013   Procedure: ENDOSCOPIC RETROGRADE CHOLANGIOPANCREATOGRAPHY (ERCP);  Surgeon: Ladene Artist, MD;  Location: [facility];   Service: Endoscopy;  Laterality: N/A;  . FOOT SURGERY     right  . IR RADIOLOGY PERIPHERAL GUIDED IV START  09/28/2017  . IR US GUIDE VASC ACCESS RIGHT  09/28/2017  . LAPAROSCOPIC CHOLECYSTECTOMY SINGLE PORT N/A 06/01/2013   Procedure: LAPAROSCOPIC CHOLECYSTECTOMY SINGLE PORT;  Surgeon: Adin Hector, MD;  Location: [facility];  Service: General;  Laterality: N/A;  . POLYPECTOMY    . RIGHT/LEFT HEART CATH AND CORONARY ANGIOGRAPHY N/A 08/30/2017   Procedure: RIGHT/LEFT HEART CATH AND CORONARY ANGIOGRAPHY;  Surgeon: Larey Dresser, MD;  Location: [facility];  Service: Cardiovascular;  Laterality: N/A;  . SHOULDER ARTHROSCOPY WITH ROTATOR CUFF REPAIR AND SUBACROMIAL DECOMPRESSION Left 05/18/2017  . SHOULDER ARTHROSCOPY WITH ROTATOR CUFF REPAIR AND SUBACROMIAL DECOMPRESSION Left 05/18/2017   Procedure: SHOULDER ARTHROSCOPY WITH ROTATOR CUFF REPAIR AND SUBACROMIAL DECOMPRESSION;  Surgeon: Tania Ade, MD;  Location: [facility];  Service: Orthopedics;  Laterality: Left;  LEFT SHOULDER ARTHROSCOPY WITH ROTATOR CUFF REPAIR AND SUBACROMIAL DECOMPRESSION  . TEE WITHOUT CARDIOVERSION N/A 05/09/2017   Procedure: TRANSESOPHAGEAL ECHOCARDIOGRAM (TEE);  Surgeon: Skeet Latch, MD;  Location: [facility];  Service: Cardiovascular;  Laterality: N/A;  . TEE WITHOUT CARDIOVERSION N/A 08/30/2017   Procedure: TRANSESOPHAGEAL ECHOCARDIOGRAM (TEE);  Surgeon: Larey Dresser, MD;  Location: [facility];  Service: Cardiovascular;  Laterality: N/A;  . TONSILLECTOMY      Family History  Problem Relation Age of Onset  . Stroke Father        No details  . Angina Mother     Social History Social History   Tobacco Use  . Smoking status: Former Smoker    Packs/day: 0.50    Years: 57.00    Pack years: 28.50    Types: Cigarettes    Last attempt to quit: 07/13/2017    Years since quitting: 0.3  . Smokeless tobacco: Never Used  Substance Use Topics  . Alcohol use: Yes    Alcohol/week: 1.2 - 1.8 oz     Types: 2 - 3 Glasses of wine per week    Comment: moderate wine ; not since 8/ 2018  . Drug use: No    Prior to Admission medications   Medication Sig Start Date End Date Taking? Authorizing Provider  amiodarone (PACERONE) 200 MG tablet Take 1 tablet (200 mg total) by mouth daily. 09/07/17  Yes Larey Dresser, MD  apixaban (ELIQUIS) 5 MG TABS tablet Take 1 tablet (5 mg total) by mouth 2 (two) times daily. 09/27/17  Yes Larey Dresser, MD  calcium carbonate (TUMS - DOSED IN MG ELEMENTAL CALCIUM) 500 MG chewable tablet Chew 1 tablet by mouth daily as needed for indigestion or heartburn.   Yes [provider]  cetirizine (ZYRTEC) 10 MG tablet Take 10 mg by mouth daily as needed for allergies.   Yes [provider]  citalopram (CELEXA) 20 MG tablet Take 1 tablet (20 mg total) by mouth at bedtime. 09/06/17  Yes Meredith Staggers, MD  docusate sodium (COLACE) 100 MG capsule Take 1 capsule (100  mg total) by mouth 3 (three) times daily as needed. 05/19/17  Yes Grier Mitts, PA-C  ferrous sulfate 325 (65 FE) MG tablet Take 1 tablet (325 mg total) by mouth daily with breakfast. 08/22/17  Yes Angiulli, Lavon Paganini, PA-C  furosemide (LASIX) 40 MG tablet Take 1 tablet (40 mg total) by mouth daily. 09/07/17  Yes Larey Dresser, MD  lisinopril (PRINIVIL,ZESTRIL) 10 MG tablet Take 1 tablet (10 mg total) by mouth daily. 09/13/17  Yes Shirley Friar, PA-C  LORazepam (ATIVAN) 0.5 MG tablet Take 1 tablet (0.5 mg total) by mouth every 6 (six) hours as needed for anxiety (aggitation). 08/22/17  Yes Angiulli, Lavon Paganini, PA-C  lovastatin (MEVACOR) 40 MG tablet Take 1 tablet (40 mg total) by mouth at bedtime. 09/27/17  Yes Larey Dresser, MD  nitroGLYCERIN (NITROSTAT) 0.4 MG SL tablet Place 1 tablet (0.4 mg total) under the tongue every 5 (five) minutes x 3 doses as needed for chest pain. 08/22/17  Yes Angiulli, Lavon Paganini, PA-C  ondansetron (ZOFRAN) 4 MG tablet Take 4 mg by mouth every 8  (eight) hours as needed for nausea or vomiting.   Yes [provider]  oxyCODONE-acetaminophen (ROXICET) 5-325 MG tablet Take 1-2 tablets by mouth every 4 (four) hours as needed for severe pain. 08/22/17  Yes Angiulli, Lavon Paganini, PA-C  pantoprazole (PROTONIX) 20 MG tablet Take 1 tablet (20 mg total) by mouth daily. 09/06/17  Yes Meredith Staggers, MD  potassium chloride SA (K-DUR,KLOR-CON) 20 MEQ tablet Take 1 tablet (20 mEq total) by mouth daily. 09/07/17  Yes Larey Dresser, MD  traZODone (DESYREL) 50 MG tablet Take 1 tablet (50 mg total) by mouth at bedtime. 09/06/17  Yes Meredith Staggers, MD    Allergies  Allergen Reactions  . Bee Venom Anaphylaxis and Swelling     Review of Systems:              General:                      normal appetite, decreased energy, no weight gain, + weight loss, NO fever             Cardiac:                       no chest pain with exertion, no chest pain at rest, + SOB with exertion, no resting SOB, no PND, no orthopnea, no palpitations, + arrhythmia, + atrial fibrillation, + LE edema, no dizzy spells, no syncope             Respiratory:                 + shortness of breath, no home oxygen, + productive cough, no dry cough, no bronchitis, + wheezing, no hemoptysis, no asthma, no pain with inspiration or cough, + sleep apnea, no CPAP at night             GI:                               resolved difficulty swallowing, + reflux, no frequent heartburn, no hiatal hernia, no abdominal pain, no constipation, no diarrhea, no hematochezia, no hematemesis, no melena             GU:  no dysuria,  no frequency, no urinary tract infection, no hematuria, no enlarged prostate, no kidney stones, + mild chronic kidney disease             Vascular:                     no pain suggestive of claudication, no pain in feet, no leg cramps, no varicose veins, no DVT, no non-healing foot ulcer             Neuro:                         + stroke,  no TIA's, no seizures, no headaches, no temporary blindness one eye,  no slurred speech, no peripheral neuropathy, no chronic pain, mild instability of gait, + memory/cognitive dysfunction             Musculoskeletal:         no arthritis, no joint swelling, no myalgias, + difficulty walking, reduced mobility              Skin:                            no rash, no itching, no skin infections, no pressure sores or ulcerations             Psych:                         + anxiety, + depression, no nervousness, no unusual recent stress             Eyes:                           no blurry vision, + floaters, no recent vision changes, + wears glasses or contacts             ENT:                            no hearing loss, no loose or painful teeth, partial dentures, last saw dentist within 6 months             Hematologic:               + easy bruising, no abnormal bleeding, no clotting disorder, no frequent epistaxis             Endocrine:                   no diabetes, does not check CBG's at home                           Physical Exam:              BP 122/63 (BP Location: Right Arm, Patient Position: Sitting, Cuff Size: Large)   Pulse 77   Resp 16   Ht 5\' 9"  (1.753 m)   Wt 225 lb 9.6 oz (102.3 kg)   SpO2 97% Comment: RA  BMI 33.32 kg/m              General:                      Moderately obese  well-appearing  HEENT:                       Unremarkable              Neck:                           no JVD, no bruits, no adenopathy              Chest:                          clear to auscultation, symmetrical breath sounds, no wheezes, no rhonchi              CV:                              RRR, grade III/VI systolic murmur              Abdomen:                    soft, non-tender, no masses              Extremities:                 warm, well-perfused, pulses diminished, no LE edema             Rectal/GU                   Deferred             Neuro:                          Grossly non-focal and symmetrical throughout             Skin:                            Clean and dry, no rashes, no breakdown   Diagnostic Tests:  Transthoracic Echocardiography  Patient: Raju, Coppolino MR #: [id number] Study Date: 07/14/2017 Gender: M Age: 49 Height: 182.9 cm Weight: 112.5 kg BSA: 2.43 m^2 Pt. Status: Room: 2H23C  Mechele Collin Barrett, Evelene Croon ATTENDING Veryl Speak [phone] PERFORMING [facility], Inpatient ADMITTING Janace Litten SONOGRAPHER Lonny Prude [place]  cc:  ------------------------------------------------------------------- LV EF: 55% - 60%  ------------------------------------------------------------------- History: PMH: Aortic stenosis. Risk factors: Hypertension. Dyslipidemia.  ------------------------------------------------------------------- Study Conclusions  - Left ventricle: The cavity size was moderately dilated. There was moderate concentric hypertrophy. Systolic function was normal. The estimated ejection fraction was in the range of 55% to 60%. There is akinesis of the basalinferior myocardium. Features are consistent with a pseudonormal left ventricular filling pattern, with concomitant abnormal relaxation and increased filling pressure (grade 2 diastolic dysfunction). Doppler parameters are consistent with high ventricular filling pressure. - Aortic valve: Cusp separation was reduced. The peak aortic velocity is elevated at 4.26m/sec but this is most likely high due to increased flow across the AV from moderate AR. The mean gradient of 16mmHg and VTI ratio of the LVOT/AV is most consistent with moderate stenosis. There was moderate regurgitation. Mean gradient (S): 33 mm Hg. VTI ratio of LVOT to aortic valve: 0.27. Valve area (VTI): 0.85 cm^2. Valve area (Vmax): 0.9 cm^2. Valve area (Vmean):  1.11 cm^2. Regurgitation pressure half-time: 379 ms. -  Mitral valve: Calcified annulus. Visually there appears to be moderate to severe regurgitation. The PISA calculation does appear accurate (mild MR by PISA). Valve area by pressure half-time: 2.22 cm^2. Valve area by continuity equation (using LVOT flow): 1.91 cm^2. - Left atrium: The atrium was moderately dilated. - Pulmonic valve: There was trivial regurgitation. - Pulmonary arteries: Systolic pressure could not be accurately estimated.  Impressions:  - Compared to echo 04/2017, the mean AV gradient and peak AV velocity are essentially unchanged.  Recommendations: Consider TEE to evaluate MR further. Visually appears to be at least moderate to severe MR.  ------------------------------------------------------------------- Study data: The previous study was not available, so comparison was made to the report of 04/27/2017. Study status: Routine. Procedure: Transthoracic echocardiography. Image quality was adequate. The study was technically difficult, as a result of poor patient compliance and restricted patient mobility. Intravenous contrast (Definity) was administered. Study completion: There were no complications. Transthoracic echocardiography. M-mode, complete 2D, spectral Doppler, and color Doppler. Birthdate: Patient birthdate: 1946-11-12. Age: Patient is 71 yr old. Sex: Gender: male. BMI: 33.6 kg/m^2. Blood pressure: 80/50 Patient status: Inpatient. Study date: Study date: 07/14/2017. Study time: 04:23 PM. Location: Bedside.  -------------------------------------------------------------------  ------------------------------------------------------------------- Left ventricle: The cavity size was moderately dilated. There was moderate concentric hypertrophy. Systolic function was normal. The estimated ejection fraction was in the range of 55% to 60%. Regional wall  motion abnormalities: There is akinesis of the basalinferior myocardium. Features are consistent with a pseudonormal left ventricular filling pattern, with concomitant abnormal relaxation and increased filling pressure (grade 2 diastolic dysfunction). Doppler parameters are consistent with high ventricular filling pressure.  ------------------------------------------------------------------- Aortic valve: Trileaflet; moderately thickened, moderately calcified leaflets. Cusp separation was reduced. Doppler: The peak aortic velocity is elevated at 4.16m/sec but this is most likely high due to increased flow across the AV from moderate AR. The mean gradient of 59mmHg and VTI ratio of the LVOT/AV is most consistent with moderate stenosis. There was moderate regurgitation. VTI ratio of LVOT to aortic valve: 0.27. Valve area (VTI): 0.85 cm^2. Indexed valve area (VTI): 0.35 cm^2/m^2. Peak velocity ratio of LVOT to aortic valve: 0.29. Valve area (Vmax): 0.9 cm^2. Indexed valve area (Vmax): 0.37 cm^2/m^2. Mean velocity ratio of LVOT to aortic valve: 0.35. Valve area (Vmean): 1.11 cm^2. Indexed valve area (Vmean): 0.46 cm^2/m^2. Mean gradient (S): 33 mm Hg. Peak gradient (S): 72 mm Hg.  ------------------------------------------------------------------- Aorta: Aortic root: The aortic root was normal in size.  ------------------------------------------------------------------- Mitral valve: Calcified annulus. Mobility was not restricted. Doppler: Transvalvular velocity was within the normal range. There was no evidence for stenosis. Visually there appears to be moderate to severe regurgitation. The PISA calculation does appear accurate (mild MR by PISA). Valve area by pressure half-time: 2.22 cm^2. Indexed valve area by pressure half-time: 0.92 cm^2/m^2. Valve area by continuity equation (using LVOT flow): 1.91 cm^2. Indexed valve area by continuity equation (using LVOT  flow): 0.79 cm^2/m^2. Mean gradient (D): 4 mm Hg. Peak gradient (D): 7 mm Hg.  ------------------------------------------------------------------- Left atrium: The atrium was moderately dilated.  ------------------------------------------------------------------- Right ventricle: The cavity size was normal. Wall thickness was normal. Systolic function was normal.  ------------------------------------------------------------------- Pulmonic valve: Structurally normal valve. Cusp separation was normal. Doppler: Transvalvular velocity was within the normal range. There was no evidence for stenosis. There was trivial regurgitation.  ------------------------------------------------------------------- Tricuspid valve: Structurally normal valve. Doppler: Transvalvular velocity was within the normal range. There was no regurgitation.  ------------------------------------------------------------------- Pulmonary artery: The main pulmonary artery was normal-sized. Systolic pressure could not  be accurately estimated.  ------------------------------------------------------------------- Right atrium: The atrium was normal in size.  ------------------------------------------------------------------- Pericardium: There was no pericardial effusion.  ------------------------------------------------------------------- Systemic veins: Inferior vena cava: The vessel was dilated. The respirophasic diameter changes were blunted (< 50%), consistent with elevated central venous pressure.  ------------------------------------------------------------------- Measurements  Left ventricle Value Reference LV ID, ED, PLAX chordal (H) 55 mm 43 - 52 LV ID, ES, PLAX chordal (H) 44 mm 23 - 38 LV fx shortening, PLAX chordal (L) 20 % >=29 LV PW thickness, ED 13  mm ---------- IVS/LV PW ratio, ED 1.08 <=1.3 Stroke volume, 2D 85 ml ---------- Stroke volume/bsa, 2D 35 ml/m^2 ---------- LV e&', lateral 6.3 cm/s ---------- LV E/e&', lateral 20.63 ---------- LV e&', medial 5.44 cm/s ---------- LV E/e&', medial 23.9 ---------- LV e&', average 5.87 cm/s ---------- LV E/e&', average 22.15 ----------  Ventricular septum Value Reference IVS thickness, ED 14 mm ----------  LVOT Value Reference LVOT ID, S 20 mm ---------- LVOT area 3.14 cm^2 ---------- LVOT peak velocity, S 122 cm/s ---------- LVOT mean velocity, S 92.1 cm/s ---------- LVOT VTI, S 27.2 cm ---------- LVOT peak gradient, S 6 mm Hg ----------  Aortic valve Value Reference Aortic valve peak velocity, S 425 cm/s ---------- Aortic valve mean velocity, S 261 cm/s ---------- Aortic valve VTI, S 100 cm ---------- Aortic mean gradient, S 33 mm Hg ---------- Aortic peak gradient, S 72 mm Hg ---------- VTI ratio, LVOT/AV 0.27 ---------- Aortic valve area, VTI 0.85 cm^2 ---------- Aortic valve area/bsa, VTI 0.35 cm^2/m^2 ---------- Velocity ratio, peak, LVOT/AV 0.29  ---------- Aortic valve area, peak velocity 0.9 cm^2 ---------- Aortic valve area/bsa, peak 0.37 cm^2/m^2 ---------- velocity Velocity ratio, mean, LVOT/AV 0.35 ---------- Aortic valve area, mean velocity 1.11 cm^2 ---------- Aortic valve area/bsa, mean 0.46 cm^2/m^2 ---------- velocity Aortic regurg pressure half-time 379 ms ----------  Aorta Value Reference Aortic root ID, ED 32 mm ----------  Left atrium Value Reference LA ID, A-P, ES 49 mm ---------- LA ID/bsa, A-P 2.02 cm/m^2 <=2.2 LA volume, S 73.3 ml ---------- LA volume/bsa, S 30.2 ml/m^2 ---------- LA volume, ES, 1-p A4C 64.1 ml ---------- LA volume/bsa, ES, 1-p A4C 26.4 ml/m^2 ---------- LA volume, ES, 1-p A2C 81.7 ml ---------- LA volume/bsa, ES, 1-p A2C 33.7 ml/m^2 ----------  Mitral valve Value Reference Mitral E-wave peak velocity 130 cm/s ---------- Mitral A-wave peak velocity 78.8 cm/s ---------- Mitral mean velocity, D 84 cm/s ---------- Mitral deceleration time (H) 359 ms 150 - 230 Mitral pressure half-time 99 ms ---------- Mitral mean gradient, D 4 mm Hg ---------- Mitral peak gradient, D 7 mm Hg ---------- Mitral E/A ratio, peak 1.6 ---------- Mitral valve area, PHT, DP 2.22 cm^2 ---------- Mitral valve area/bsa, PHT, DP  0.92 cm^2/m^2 ---------- Mitral valve area, LVOT 1.91 cm^2 ---------- continuity Mitral valve area/bsa, LVOT 0.79 cm^2/m^2 ---------- continuity Mitral annulus VTI, D 44.7 cm ---------- Mitral maximal regurg velocity, 425 cm/s ---------- PISA Mitral regurg VTI, PISA 135 cm ---------- Mitral ERO, PISA 0.14 cm^2 ---------- Mitral regurg volume, PISA 19 ml ----------  Right atrium Value Reference RA ID, S-I, ES, A4C (H) 51.6 mm 34 - 49 RA area, ES, A4C 18.6 cm^2 8.3 - 19.5 RA volume, ES, A/L 52.9 ml ---------- RA volume/bsa, ES, A/L 21.8 ml/m^2 ----------  Systemic veins Value Reference Estimated CVP 8 mm Hg ----------  Right ventricle Value Reference RV ID, minor axis, ED, A4C base 37 mm ---------- TAPSE 22.5 mm ---------- RV s&', lateral, S 9.28 cm/s ----------  Legend: (L) and (H) mark values  outside specified reference range.  ------------------------------------------------------------------- Prepared and Electronically Authenticated by  Fransico Him, MD 2018-08-03T18:18:22     Transesophageal Echocardiography  (Report amended )  Patient: Kitai, Purdom MR #: [id number] Study Date: 08/30/2017 Gender: M Age: 51 Height: 175.3 cm Weight: 104.8 kg BSA: 2.3 m^2 Pt. Status: Room:  ADMITTING Loralie Champagne, M.D. ATTENDING Loralie Champagne, M.D. ORDERING Loralie Champagne, M.D. PERFORMING Loralie Champagne, M.D. REFERRING Loralie Champagne, M.D. SONOGRAPHER Tresa Res, RDCS  cc:  -------------------------------------------------------------------  ------------------------------------------------------------------- Indications: CHF - 428.0.  ------------------------------------------------------------------- Study Conclusions  - Left ventricle: Mildly dilated LV with basal to mid inferolateral hypokinesis and basal inferior akinesis. EF 50%. - Aortic valve: The aortic valve was moderately calcified. Mean gradient 22 mmHg with AVA 1.46 cm^2 suggests moderate stenosis. Moderate to severe aortic insufficiency with jet width/LVOT width 0.60. Trileaflet. - Aorta: Normal caliber aorta with mild plaque in the descending thoracic aorta. - Mitral valve: The posterior leaflet of the mitral valve is restricted, there is mitral regurgitation that appears to be severe (PISA ERO 0.4 cm^2 and MR regurgitant volume 80 cc). There was no systolic flow reversal in the pulmonary veins. - Left atrium: The atrium was moderately dilated. No evidence of thrombus in the atrial cavity or appendage. - Right ventricle: The cavity size was normal. Systolic function was normal. - Right atrium: No evidence of thrombus in the atrial cavity or appendage. - Atrial septum: No defect or patent foramen ovale was identified. Echo contrast study showed no right-to-left atrial level shunt, at baseline or with provocation.  Impressions:  - 1. LV mildly dilated with EF 50% with wall motion abnormalities noted above. 2. Suspect severe mitral regurgitation. There is restriction of the posterior leaflet, possible infarct-related MR given wall motion abnormalities. 3. Moderate aortic stenosis with moderate to severe aortic insufficiency.  ------------------------------------------------------------------- Study data: Study status: Routine. Consent: The  risks, benefits, and alternatives to the procedure were explained to the patient and informed consent was obtained. Procedure: Initial setup. The patient was brought to the laboratory. Surface ECG leads were monitored. Sedation. Conscious sedation was administered. Transesophageal echocardiography. Topical anesthesia was obtained using viscous lidocaine. A transesophageal probe was inserted by the attending cardiologist. Image quality was adequate. Study completion: The patient tolerated the procedure well. There were no complications. Administered medications: Fentanyl, 54mcg, IV. Midazolam, 4mg , IV. Diagnostic transesophageal echocardiography. 2D and color Doppler. Birthdate: Patient birthdate: 13-Mar-1946. Age: Patient is 71 yr old. Sex: Gender: male. BMI: 34.1 kg/m^2. Blood pressure: 134/55 Patient status: Outpatient. Study date: Study date: 08/30/2017. Study time: 10:22 AM. Location: Endoscopy.  -------------------------------------------------------------------  ------------------------------------------------------------------- Left ventricle: Mildly dilated LV with basal to mid inferolateral hypokinesis and basal inferior akinesis. EF 50%. Wall thickness was normal.  ------------------------------------------------------------------- Aortic valve: The aortic valve was moderately calcified. Mean gradient 22 mmHg with AVA 1.46 cm^2 suggests moderate stenosis. Moderate to severe aortic insufficiency with jet width/LVOT width 0.60. Trileaflet. Doppler: VTI ratio of LVOT to aortic valve: 0.35. Valve area (VTI): 1.46 cm^2. Indexed valve area (VTI): 0.64 cm^2/m^2. Peak velocity ratio of LVOT to aortic valve: 0.41. Valve area (Vmax): 1.68 cm^2. Indexed valve area (Vmax): 0.73 cm^2/m^2. Mean velocity ratio of LVOT to aortic valve: 0.41. Valve area (Vmean): 1.7 cm^2. Indexed valve area (Vmean): 0.74 cm^2/m^2. Mean gradient (S): 22 mm  Hg. Peak gradient (S): 44 mm Hg.  ------------------------------------------------------------------- Aorta: Normal caliber aorta with mild plaque in the descending thoracic aorta.  ------------------------------------------------------------------- Mitral valve: The posterior leaflet of the mitral valve is restricted, there is mitral  regurgitation that appears to be severe (PISA ERO 0.4 cm^2 and MR regurgitant volume 80 cc). There was no systolic flow reversal in the pulmonary veins. Doppler: There was no evidence for stenosis.  ------------------------------------------------------------------- Left atrium: The atrium was moderately dilated. No evidence of thrombus in the atrial cavity or appendage.  ------------------------------------------------------------------- Atrial septum: No defect or patent foramen ovale was identified. Echo contrast study showed no right-to-left atrial level shunt, at baseline or with provocation.  ------------------------------------------------------------------- Right ventricle: The cavity size was normal. Systolic function was normal.  ------------------------------------------------------------------- Pulmonic valve: Doppler: There was trivial regurgitation.  ------------------------------------------------------------------- Tricuspid valve: Doppler: There was trivial regurgitation.  ------------------------------------------------------------------- Right atrium: The atrium was normal in size. No evidence of thrombus in the atrial cavity or appendage.  ------------------------------------------------------------------- Pericardium: There was no pericardial effusion.  ------------------------------------------------------------------- Post procedure conclusions Ascending Aorta:  - Normal caliber aorta with mild plaque in the descending  thoracic aorta.  ------------------------------------------------------------------- Measurements  Left ventricle Value Stroke volume, 2D 83 ml Stroke volume/bsa, 2D 36 ml/m^2  LVOT Value LVOT ID, S 23 mm LVOT area 4.15 cm^2 LVOT peak velocity, S 135 cm/s LVOT mean velocity, S 83.5 cm/s LVOT VTI, S 19.9 cm LVOT peak gradient, S 7 mm Hg  Aortic valve Value Aortic valve peak velocity, S 333 cm/s Aortic valve mean velocity, S 204 cm/s Aortic valve VTI, S 56.4 cm Aortic mean gradient, S 21 mm Hg Aortic peak gradient, S 44 mm Hg VTI ratio, LVOT/AV 0.35 Aortic valve area, VTI 1.46 cm^2 Aortic valve area/bsa, VTI 0.64 cm^2/m^2 Velocity ratio, peak, LVOT/AV 0.41 Aortic valve area, peak velocity 1.68 cm^2 Aortic valve area/bsa, peak velocity 0.73 cm^2/m^2 Velocity ratio, mean, LVOT/AV 0.41 Aortic valve area, mean velocity 1.7 cm^2 Aortic valve area/bsa, mean velocity 0.74 cm^2/m^2  Mitral valve Value Mitral maximal regurg velocity, PISA 670 cm/s Mitral regurg VTI, PISA 201 cm Mitral ERO, PISA 0.4 cm^2 Mitral regurg volume, PISA 80 ml  Legend: (L) and (H) mark values outside specified reference range.  ------------------------------------------------------------------- Clayborne Dana, M.D. 2018-09-30T13:26:42    RIGHT/LEFT HEART CATH AND CORONARY  ANGIOGRAPHY  Conclusion   1. Normal right and left heart filling pressures and preserved cardiac output.  2. Moderate aortic stenosis by mean gradient. Aortic root shot for aortic insufficiency was not done to save contrast, had TEE today earlier.  3. 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 The larger vessel supplying the PDA was totally occluded in the mid vessel with collaterals from the left system. The small vessel supplying RV/PLV territory was patent. The left coronary system did not have significant disease.   I am going to refer Mr Kihara for surgical evaluation. 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 Would consider aortic and mitral valve repairs along with Maze and possible bypass to PDA.    Procedural Details/Technique   Technical Details Procedure: Right Heart Cath, Left Heart Cath, Selective Coronary Angiography  Indication: Valvular heart disease, concern for CAD.   Procedural Details: The right groin and right radial area were prepped, draped, and anesthetized with 1% lidocaine. Using the modified Seldinger technique a 7 French sheath was placed in the right femoral vein. A Swan-Ganz catheter was used for the right heart catheterization. Standard protocol was followed for recording of right heart pressures and sampling of oxygen saturations. Fick cardiac output was calculated. The right radial artery was entered using modified  Seldinger technique and a 53F sheath was placed. Standard Judkins catheters were used for selective coronary angiography. There were no immediate procedural complications. The patient was transferred to the post catheterization recovery area for further monitoring.   Estimated blood loss <50 mL.  During this procedure the patient was administered the following to achieve and  maintain moderate conscious sedation: Versed 1 mg, Fentanyl 25 mcg, while the patient's heart rate, blood pressure, and oxygen saturation were continuously monitored. The period of conscious sedation was 43 minutes, of which I was present face-to-face 100% of this time.    Coronary Findings   Dominance: Right  Left Main  No significant disease.  Left Anterior Descending  Luminal irregularities.  Ramus Intermedius  Large vessel, luminal irregularities.  Left Circumflex  Luminal irregularities.  Right Coronary Artery  The RCA circulation was anomalous. 2 arteries originated from the right cusp. One covered primarily the PLV territory and could be easily engaged. This artery had minimal luminal irregularities. A 2nd larger artery covered the PDA territory primarily. This artery was totally occluded in the mid-portion and could not be selectively engaged. There are collaterals to the large PDA from the left system.  Right Heart   Right Heart Pressures RHC Procedural Findings: Hemodynamics (mmHg) RA mean 3 RV 26/3 PA 28/13, mean 19 PCWP mean 8 LV 155/18 AO 132/59  Aortic valve mean gradient 23 mmHg  Oxygen saturations: PA 60% AO 98%  Cardiac Output (Fick) 5.9  Cardiac Index (Fick) 2.68    Implants        No implant documentation for this case.  MERGE Images   Show images for CARDIAC CATHETERIZATION   Link to Procedure Log   Procedure Log    Hemo Data    Most Recent Value  Fick Cardiac Output 5.9 L/min  Fick Cardiac Output Index 2.68 (L/min)/BSA  Aortic Mean Gradient 23.2 mmHg  Aortic Peak Gradient 19 mmHg  Aortic Valve Area 1.30  Aortic Value Area Index 0.59 cm2/BSA  RA A Wave 6 mmHg  RA V Wave 3 mmHg  RA Mean 3 mmHg  RV Systolic Pressure 26 mmHg  RV Diastolic Pressure 1 mmHg  RV EDP 3 mmHg  PA Systolic Pressure 28 mmHg  PA Diastolic Pressure 13 mmHg  PA Mean 19 mmHg  PW A Wave 10 mmHg  PW V Wave 11 mmHg  PW Mean 8 mmHg  AO Systolic Pressure 510  mmHg  AO Diastolic Pressure 59 mmHg  AO Mean 86 mmHg  LV Systolic Pressure 258 mmHg  LV Diastolic Pressure 13 mmHg  LV EDP 18 mmHg  Arterial Occlusion Pressure Extended Systolic Pressure 527 mmHg  Arterial Occlusion Pressure Extended Diastolic Pressure 59 mmHg  Arterial Occlusion Pressure Extended Mean Pressure 88 mmHg  Left Ventricular Apex Extended Systolic Pressure 782 mmHg  Left Ventricular Apex Extended Diastolic Pressure 10 mmHg  Left Ventricular Apex Extended EDP Pressure 20 mmHg  QP/QS 1  TPVR Index 7.09 HRUI  TSVR Index 32.08 HRUI  PVR SVR Ratio 0.13  TPVR/TSVR Ratio 0.22  Order-Level Documents:   There are no order-level documents.  Encounter-Level Documents - 08/30/2017:   Scan on 08/31/2017 3:56 PM by Default, Provider, MD  Scan on 08/31/2017 3:52 PM by Default, Provider, MD  Scan on 08/31/2017 3:40 PM by Default, Provider, MD  Document on 08/30/2017 4:15 PM by Willette Alma, RN : After Visit Summary  Document on 08/30/2017 4:15 PM by Willette Alma, RN : IP After Visit Summary  Scan on  08/30/2017 12:49 PM by Default, Provider, MD  Electronic signature on 08/30/2017 8:59 AM  Electronic signature on 08/30/2017 8:59 AM      Signed   Electronically signed by Larey Dresser, MD on 08/30/17 at 1309 EDT     Cardiac TAVR CT  TECHNIQUE: The patient was scanned on a [employer]. A 120 kV retrospective scan was triggered in the descending thoracic aorta at 111 HU's. Gantry rotation speed was 250 msecs and collimation was .6 mm. 5 mg of iv Metoprolol and no nitro were given. The 3D data set was reconstructed in 5% intervals of the R-R cycle. Systolic and diastolic phases were analyzed on a dedicated work station using MPR, MIP and VRT modes. The patient received 80 cc of contrast.  FINDINGS: Aortic Valve: Trileaflet, severely thickened and mildly calcified  leaflets with non-coaptation in diastole.  Aorta: Normal size, no dissection, mild  diffuse calcifications  predominantly in the aortic arch and in the sinotubular junction.  Sinotubular Junction: 28 x 28 mm  Ascending Thoracic Aorta: 37 x 35 mm  Aortic Arch: 28 x 25 mm  Descending Thoracic Aorta: 30 x 29 mm  Sinus of Valsalva Measurements:  Non-coronary: 34 mm  Right -coronary: 32 mm  Left -coronary: 34 mm  Coronary Artery Height above Annulus:  Left Main: 16 mm  Right Coronary: 16 mm  Virtual Basal Annulus Measurements:  Maximum/Minimum Diameter: 27.8 x 21.4 mm  Mean Diameter: 23.8 mm  Perimeter: 76.9 mm  Area: 446 mm2  Mitral valve appears thickened with no prolapse. There are mild  posterior mitral annular calcifications.  Left atrium is severely dilated.  Coronary Arteries: Normal origin.  No ASD or VSD.  IMPRESSION: 1. Trileaflet aortic valve with severely thickened and mildly calcified leaflets with non-coaptation in diastole.  2. Aorta has normal size, no dissection, mild diffuse calcifications predominantly in the aortic arch and in the sinotubular junction.  3. Mitral valve appears thickened with no prolapse. There are mild posterior mitral annular calcifications.  4. No thrombus in the left atrial appendage.  5. Mildly dilated pulmonary artery measuring 34 x 30 mm suggestive of pulmonary hypertension.  Ena Dawley   Electronically Signed By: Ena Dawley On: 09/29/2017 09:59  Impression:  Patient has mild to moderate aortic stenosis with moderate to severe aortic insufficiency and severe mitral regurgitation. He presented with a progression of symptoms of exertional shortness of breath consistent with chronic diastolic congestive heart failure followed by sudden acute presentation with acute hypoxemic respiratory failure in the setting of acute on chronic diastolic congestive heart failure in the setting ofrapid atrial fibrillation this past August. His acute  presentation was complicated by ventilator-dependent respiratory failure hypotension requiring pressors, acute renal failure, and severe confusion and delirium, all of which has resolved. He appears to have essentially recovered from his acute illness. At present he describes stable symptoms of exertional shortness of breath and fatigue consistent with chronic diastolic congestive heart failure, New York Heart Association functional class I-II. He has not had recurrent palpitations and appears to be maintaining sinus rhythm on oral amiodarone. His physical strength has improved and reportedly essentially back to his baseline.  I have personally reviewed the patient's most recent transthoracic and transesophageal echocardiograms,diagnostic cardiac catheterization, and CT angiogram. The aortic valve is trileaflet with mild sclerosis and restricted leaflet mobility. There is moderate to severe central aortic insufficiency. This may be rheumatic in etiology, and I do not feel that the aortic valve is repairable. There is type  III dysfunction of the mitral valve with severe restriction involving primarily the posterior leaflet. The mitral valve is mildly thickened. This may be ischemic and secondary to previous inferior wall myocardial infarction, although the possibility of rheumatic disease cannot be definitively ruled out. I remain hopeful that the mitral valve may be repairable, but mitral valve replacement may be necessary. 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 The terminal branches of the right coronary arterymay or may not be graftable target vesselsfor surgical revascularization. CT angiography reveals no complicating features.The aortic annulus appears large enough for implantation of a 21 mm stented bioprosthetic tissue  valve.  I agree the patient would probably best be treated with aortic valve replacement, mitral valve repair or replacement, Maze procedure, and coronary artery bypass grafting. However, risks associated with surgery will be extremely high in the patient's postoperative convalescence may be prolonged even the absence of any significant complications. Alternatively, edge to edge palliative mitral clip could be considered with continued medical therapy for the patient's aortic valve disease, atrial fibrillation, and coronary artery disease. Continued medical therapy without some type of intervention be associated with relatively poor prognosis. Although the mitral clip might be better than continued medical therapy, long-term prognosis would clearly be best with definitive surgical intervention.   Plan:  I have again reviewed treatment alternatives with the patient and his wife in the office today.  We discussed the high risks associated with surgical intervention and long-term prognosis with continued medical therapy without surgery.    Discussion was held comparing the relative risks of mechanical valve replacement with need for lifelong anticoagulation versus use of a bioprosthetic tissue valve and the associated potential for late structural valve deterioration and failure.  This discussion was placed in the context of the patient's particular circumstances, and as a result the patient specifically requests that their valve be replaced using bioprosthetic tissue valves.   The patient understands and accepts all potential associated risks of surgery including but not limited to risk of death, stroke, myocardial infarction, congestive heart failure, respiratory failure, renal failure, pneumonia, bleeding requiring blood transfusion and or reexploration, arrhythmia, heart block or bradycardia requiring permanent pacemaker, aortic dissection or other major vascular complication, pleural effusions or other  delayed complications related to continued congestive heart failure, and other late complications related to valve replacement including structural valve deterioration and failure, thrombosis, endocarditis, or paravalvular leak and/or late recurrence of symptomatic ischemic heart disease.    Valentina Gu. Roxy Manns, MD 11/13/2017 11:46 AM

## 2017-11-16 ENCOUNTER — Inpatient Hospital Stay (HOSPITAL_COMMUNITY): Payer: Medicare HMO | Admitting: Anesthesiology

## 2017-11-16 ENCOUNTER — Encounter (HOSPITAL_COMMUNITY): Payer: Self-pay

## 2017-11-16 ENCOUNTER — Inpatient Hospital Stay (HOSPITAL_COMMUNITY)
Admission: RE | Admit: 2017-11-16 | Discharge: 2017-11-24 | DRG: 219 | Disposition: A | Discharge status: Home Health | Payer: Medicare HMO | Source: Ambulatory Visit | Attending: Thoracic Surgery (Cardiothoracic Vascular Surgery) | Admitting: Thoracic Surgery (Cardiothoracic Vascular Surgery)

## 2017-11-16 ENCOUNTER — Inpatient Hospital Stay (HOSPITAL_COMMUNITY): Payer: Medicare HMO

## 2017-11-16 ENCOUNTER — Encounter (HOSPITAL_COMMUNITY)
Admission: RE | Disposition: A | Discharge status: Home Health | Payer: Self-pay | Source: Ambulatory Visit | Attending: Thoracic Surgery (Cardiothoracic Vascular Surgery)

## 2017-11-16 ENCOUNTER — Inpatient Hospital Stay (HOSPITAL_COMMUNITY): Payer: Medicare HMO | Admitting: Emergency Medicine

## 2017-11-16 ENCOUNTER — Other Ambulatory Visit: Payer: Self-pay

## 2017-11-16 ENCOUNTER — Ambulatory Visit (HOSPITAL_COMMUNITY): Payer: Medicare HMO

## 2017-11-16 DIAGNOSIS — Z951 Presence of aortocoronary bypass graft: Secondary | ICD-10-CM

## 2017-11-16 DIAGNOSIS — I340 Nonrheumatic mitral (valve) insufficiency: Secondary | ICD-10-CM | POA: Diagnosis present

## 2017-11-16 DIAGNOSIS — I69354 Hemiplegia and hemiparesis following cerebral infarction affecting left non-dominant side: Secondary | ICD-10-CM

## 2017-11-16 DIAGNOSIS — E875 Hyperkalemia: Secondary | ICD-10-CM | POA: Diagnosis not present

## 2017-11-16 DIAGNOSIS — I130 Hypertensive heart and chronic kidney disease with heart failure and stage 1 through stage 4 chronic kidney disease, or unspecified chronic kidney disease: Secondary | ICD-10-CM | POA: Diagnosis present

## 2017-11-16 DIAGNOSIS — R001 Bradycardia, unspecified: Secondary | ICD-10-CM | POA: Diagnosis not present

## 2017-11-16 DIAGNOSIS — E669 Obesity, unspecified: Secondary | ICD-10-CM | POA: Diagnosis present

## 2017-11-16 DIAGNOSIS — F329 Major depressive disorder, single episode, unspecified: Secondary | ICD-10-CM | POA: Diagnosis present

## 2017-11-16 DIAGNOSIS — I358 Other nonrheumatic aortic valve disorders: Secondary | ICD-10-CM | POA: Diagnosis not present

## 2017-11-16 DIAGNOSIS — R1310 Dysphagia, unspecified: Secondary | ICD-10-CM | POA: Diagnosis present

## 2017-11-16 DIAGNOSIS — I352 Nonrheumatic aortic (valve) stenosis with insufficiency: Secondary | ICD-10-CM | POA: Diagnosis present

## 2017-11-16 DIAGNOSIS — I10 Essential (primary) hypertension: Secondary | ICD-10-CM | POA: Diagnosis present

## 2017-11-16 DIAGNOSIS — G4733 Obstructive sleep apnea (adult) (pediatric): Secondary | ICD-10-CM | POA: Diagnosis present

## 2017-11-16 DIAGNOSIS — Z9049 Acquired absence of other specified parts of digestive tract: Secondary | ICD-10-CM

## 2017-11-16 DIAGNOSIS — N170 Acute kidney failure with tubular necrosis: Secondary | ICD-10-CM | POA: Diagnosis not present

## 2017-11-16 DIAGNOSIS — I480 Paroxysmal atrial fibrillation: Secondary | ICD-10-CM | POA: Diagnosis present

## 2017-11-16 DIAGNOSIS — Z972 Presence of dental prosthetic device (complete) (partial): Secondary | ICD-10-CM

## 2017-11-16 DIAGNOSIS — T501X5A Adverse effect of loop [high-ceiling] diuretics, initial encounter: Secondary | ICD-10-CM | POA: Diagnosis not present

## 2017-11-16 DIAGNOSIS — I080 Rheumatic disorders of both mitral and aortic valves: Principal | ICD-10-CM | POA: Diagnosis present

## 2017-11-16 DIAGNOSIS — Z95820 Peripheral vascular angioplasty status with implants and grafts: Secondary | ICD-10-CM

## 2017-11-16 DIAGNOSIS — F419 Anxiety disorder, unspecified: Secondary | ICD-10-CM | POA: Diagnosis present

## 2017-11-16 DIAGNOSIS — I69391 Dysphagia following cerebral infarction: Secondary | ICD-10-CM

## 2017-11-16 DIAGNOSIS — Z91030 Bee allergy status: Secondary | ICD-10-CM | POA: Diagnosis not present

## 2017-11-16 DIAGNOSIS — I350 Nonrheumatic aortic (valve) stenosis: Secondary | ICD-10-CM

## 2017-11-16 DIAGNOSIS — I5033 Acute on chronic diastolic (congestive) heart failure: Secondary | ICD-10-CM | POA: Diagnosis not present

## 2017-11-16 DIAGNOSIS — R110 Nausea: Secondary | ICD-10-CM | POA: Diagnosis not present

## 2017-11-16 DIAGNOSIS — Z974 Presence of external hearing-aid: Secondary | ICD-10-CM

## 2017-11-16 DIAGNOSIS — Z79891 Long term (current) use of opiate analgesic: Secondary | ICD-10-CM

## 2017-11-16 DIAGNOSIS — J9811 Atelectasis: Secondary | ICD-10-CM | POA: Diagnosis not present

## 2017-11-16 DIAGNOSIS — Y92230 Patient room in hospital as the place of occurrence of the external cause: Secondary | ICD-10-CM | POA: Diagnosis not present

## 2017-11-16 DIAGNOSIS — I4891 Unspecified atrial fibrillation: Secondary | ICD-10-CM | POA: Diagnosis not present

## 2017-11-16 DIAGNOSIS — N183 Chronic kidney disease, stage 3 unspecified: Secondary | ICD-10-CM | POA: Diagnosis present

## 2017-11-16 DIAGNOSIS — K219 Gastro-esophageal reflux disease without esophagitis: Secondary | ICD-10-CM | POA: Diagnosis present

## 2017-11-16 DIAGNOSIS — Z9889 Other specified postprocedural states: Secondary | ICD-10-CM

## 2017-11-16 DIAGNOSIS — Z8679 Personal history of other diseases of the circulatory system: Secondary | ICD-10-CM

## 2017-11-16 DIAGNOSIS — E876 Hypokalemia: Secondary | ICD-10-CM | POA: Diagnosis not present

## 2017-11-16 DIAGNOSIS — Z79899 Other long term (current) drug therapy: Secondary | ICD-10-CM

## 2017-11-16 DIAGNOSIS — Z87891 Personal history of nicotine dependence: Secondary | ICD-10-CM

## 2017-11-16 DIAGNOSIS — I4819 Other persistent atrial fibrillation: Secondary | ICD-10-CM

## 2017-11-16 DIAGNOSIS — D62 Acute posthemorrhagic anemia: Secondary | ICD-10-CM | POA: Diagnosis not present

## 2017-11-16 DIAGNOSIS — I359 Nonrheumatic aortic valve disorder, unspecified: Secondary | ICD-10-CM | POA: Diagnosis present

## 2017-11-16 DIAGNOSIS — J439 Emphysema, unspecified: Secondary | ICD-10-CM | POA: Diagnosis present

## 2017-11-16 DIAGNOSIS — I442 Atrioventricular block, complete: Secondary | ICD-10-CM | POA: Diagnosis present

## 2017-11-16 DIAGNOSIS — Z6835 Body mass index (BMI) 35.0-35.9, adult: Secondary | ICD-10-CM

## 2017-11-16 DIAGNOSIS — E1122 Type 2 diabetes mellitus with diabetic chronic kidney disease: Secondary | ICD-10-CM | POA: Diagnosis present

## 2017-11-16 DIAGNOSIS — I2510 Atherosclerotic heart disease of native coronary artery without angina pectoris: Secondary | ICD-10-CM | POA: Diagnosis present

## 2017-11-16 DIAGNOSIS — I252 Old myocardial infarction: Secondary | ICD-10-CM | POA: Diagnosis not present

## 2017-11-16 DIAGNOSIS — D509 Iron deficiency anemia, unspecified: Secondary | ICD-10-CM | POA: Diagnosis present

## 2017-11-16 DIAGNOSIS — Z953 Presence of xenogenic heart valve: Secondary | ICD-10-CM

## 2017-11-16 DIAGNOSIS — Z952 Presence of prosthetic heart valve: Secondary | ICD-10-CM

## 2017-11-16 DIAGNOSIS — Z950 Presence of cardiac pacemaker: Secondary | ICD-10-CM

## 2017-11-16 DIAGNOSIS — E785 Hyperlipidemia, unspecified: Secondary | ICD-10-CM | POA: Diagnosis present

## 2017-11-16 DIAGNOSIS — Z823 Family history of stroke: Secondary | ICD-10-CM

## 2017-11-16 DIAGNOSIS — Z7901 Long term (current) use of anticoagulants: Secondary | ICD-10-CM

## 2017-11-16 DIAGNOSIS — G8929 Other chronic pain: Secondary | ICD-10-CM | POA: Diagnosis present

## 2017-11-16 DIAGNOSIS — I351 Nonrheumatic aortic (valve) insufficiency: Secondary | ICD-10-CM

## 2017-11-16 HISTORY — PX: MAZE: SHX5063

## 2017-11-16 HISTORY — PX: ENDOVEIN HARVEST OF GREATER SAPHENOUS VEIN: SHX5059

## 2017-11-16 HISTORY — DX: Presence of aortocoronary bypass graft: Z95.1

## 2017-11-16 HISTORY — DX: Presence of xenogenic heart valve: Z95.3

## 2017-11-16 HISTORY — PX: AORTIC VALVE REPLACEMENT: SHX41

## 2017-11-16 HISTORY — PX: MITRAL VALVE REPAIR: SHX2039

## 2017-11-16 HISTORY — DX: Personal history of other diseases of the circulatory system: Z86.79

## 2017-11-16 HISTORY — PX: CORONARY ARTERY BYPASS GRAFT: SHX141

## 2017-11-16 HISTORY — PX: TEE WITHOUT CARDIOVERSION: SHX5443

## 2017-11-16 HISTORY — DX: Other specified postprocedural states: Z98.890

## 2017-11-16 HISTORY — PX: ARTERIAL LINE INSERTION: CATH118227

## 2017-11-16 LAB — POCT I-STAT, CHEM 8
BUN: 19 mg/dL (ref 6–20)
BUN: 15 mg/dL (ref 6–20)
BUN: 18 mg/dL (ref 6–20)
BUN: 18 mg/dL (ref 6–20)
BUN: 19 mg/dL (ref 6–20)
BUN: 19 mg/dL (ref 6–20)
BUN: 19 mg/dL (ref 6–20)
BUN: 19 mg/dL (ref 6–20)
CALCIUM ION: 1.21 mmol/L (ref 1.15–1.40)
CALCIUM ION: 0.92 mmol/L — AB (ref 1.15–1.40)
CALCIUM ION: 1.2 mmol/L (ref 1.15–1.40)
CALCIUM ION: 1.09 mmol/L — AB (ref 1.15–1.40)
CALCIUM ION: 1.09 mmol/L — AB (ref 1.15–1.40)
CHLORIDE: 106 mmol/L (ref 101–111)
CHLORIDE: 102 mmol/L (ref 101–111)
CHLORIDE: 106 mmol/L (ref 101–111)
CHLORIDE: 106 mmol/L (ref 101–111)
CHLORIDE: 107 mmol/L (ref 101–111)
CHLORIDE: 110 mmol/L (ref 101–111)
CREATININE: 1.3 mg/dL — AB (ref 0.61–1.24)
CREATININE: 1.3 mg/dL — AB (ref 0.61–1.24)
CREATININE: 1.2 mg/dL (ref 0.61–1.24)
Calcium, Ion: 1.1 mmol/L — ABNORMAL LOW (ref 1.15–1.40)
Calcium, Ion: 1.09 mmol/L — ABNORMAL LOW (ref 1.15–1.40)
Calcium, Ion: 1.17 mmol/L (ref 1.15–1.40)
Chloride: 106 mmol/L (ref 101–111)
Chloride: 107 mmol/L (ref 101–111)
Creatinine, Ser: 1 mg/dL (ref 0.61–1.24)
Creatinine, Ser: 1.3 mg/dL — ABNORMAL HIGH (ref 0.61–1.24)
Creatinine, Ser: 1.2 mg/dL (ref 0.61–1.24)
Creatinine, Ser: 1.1 mg/dL (ref 0.61–1.24)
Creatinine, Ser: 1.2 mg/dL (ref 0.61–1.24)
GLUCOSE: 112 mg/dL — AB (ref 65–99)
GLUCOSE: 116 mg/dL — AB (ref 65–99)
GLUCOSE: 97 mg/dL (ref 65–99)
GLUCOSE: 157 mg/dL — AB (ref 65–99)
GLUCOSE: 147 mg/dL — AB (ref 65–99)
Glucose, Bld: 171 mg/dL — ABNORMAL HIGH (ref 65–99)
Glucose, Bld: 161 mg/dL — ABNORMAL HIGH (ref 65–99)
Glucose, Bld: 133 mg/dL — ABNORMAL HIGH (ref 65–99)
HCT: 29 % — ABNORMAL LOW (ref 39.0–52.0)
HCT: 24 % — ABNORMAL LOW (ref 39.0–52.0)
HCT: 28 % — ABNORMAL LOW (ref 39.0–52.0)
HCT: 24 % — ABNORMAL LOW (ref 39.0–52.0)
HCT: 27 % — ABNORMAL LOW (ref 39.0–52.0)
HCT: 27 % — ABNORMAL LOW (ref 39.0–52.0)
HEMATOCRIT: 24 % — AB (ref 39.0–52.0)
HEMATOCRIT: 24 % — AB (ref 39.0–52.0)
HEMOGLOBIN: 9.5 g/dL — AB (ref 13.0–17.0)
HEMOGLOBIN: 8.2 g/dL — AB (ref 13.0–17.0)
HEMOGLOBIN: 8.2 g/dL — AB (ref 13.0–17.0)
HEMOGLOBIN: 8.2 g/dL — AB (ref 13.0–17.0)
Hemoglobin: 9.9 g/dL — ABNORMAL LOW (ref 13.0–17.0)
Hemoglobin: 8.2 g/dL — ABNORMAL LOW (ref 13.0–17.0)
Hemoglobin: 9.2 g/dL — ABNORMAL LOW (ref 13.0–17.0)
Hemoglobin: 9.2 g/dL — ABNORMAL LOW (ref 13.0–17.0)
POTASSIUM: 4.2 mmol/L (ref 3.5–5.1)
POTASSIUM: 4.2 mmol/L (ref 3.5–5.1)
POTASSIUM: 4.3 mmol/L (ref 3.5–5.1)
POTASSIUM: 4.2 mmol/L (ref 3.5–5.1)
POTASSIUM: 4.2 mmol/L (ref 3.5–5.1)
POTASSIUM: 4.1 mmol/L (ref 3.5–5.1)
Potassium: 4.3 mmol/L (ref 3.5–5.1)
Potassium: 4 mmol/L (ref 3.5–5.1)
SODIUM: 143 mmol/L (ref 135–145)
SODIUM: 141 mmol/L (ref 135–145)
SODIUM: 144 mmol/L (ref 135–145)
SODIUM: 142 mmol/L (ref 135–145)
Sodium: 142 mmol/L (ref 135–145)
Sodium: 142 mmol/L (ref 135–145)
Sodium: 143 mmol/L (ref 135–145)
Sodium: 143 mmol/L (ref 135–145)
TCO2: 24 mmol/L (ref 22–32)
TCO2: 24 mmol/L (ref 22–32)
TCO2: 26 mmol/L (ref 22–32)
TCO2: 26 mmol/L (ref 22–32)
TCO2: 28 mmol/L (ref 22–32)
TCO2: 26 mmol/L (ref 22–32)
TCO2: 22 mmol/L (ref 22–32)
TCO2: 23 mmol/L (ref 22–32)

## 2017-11-16 LAB — POCT I-STAT 3, ART BLOOD GAS (G3+)
ACID-BASE DEFICIT: 4 mmol/L — AB (ref 0.0–2.0)
ACID-BASE DEFICIT: 6 mmol/L — AB (ref 0.0–2.0)
Acid-base deficit: 3 mmol/L — ABNORMAL HIGH (ref 0.0–2.0)
Acid-base deficit: 4 mmol/L — ABNORMAL HIGH (ref 0.0–2.0)
Acid-base deficit: 6 mmol/L — ABNORMAL HIGH (ref 0.0–2.0)
Acid-base deficit: 6 mmol/L — ABNORMAL HIGH (ref 0.0–2.0)
BICARBONATE: 22.2 mmol/L (ref 20.0–28.0)
BICARBONATE: 21.5 mmol/L (ref 20.0–28.0)
BICARBONATE: 20.7 mmol/L (ref 20.0–28.0)
BICARBONATE: 20.5 mmol/L (ref 20.0–28.0)
Bicarbonate: 21.6 mmol/L (ref 20.0–28.0)
Bicarbonate: 20.6 mmol/L (ref 20.0–28.0)
O2 SAT: 99 %
O2 SAT: 94 %
O2 Saturation: 100 %
O2 Saturation: 98 %
O2 Saturation: 91 %
O2 Saturation: 97 %
PCO2 ART: 36 mmHg (ref 32.0–48.0)
PCO2 ART: 41.4 mmHg (ref 32.0–48.0)
PCO2 ART: 42.6 mmHg (ref 32.0–48.0)
PH ART: 7.301 — AB (ref 7.350–7.450)
PH ART: 7.387 (ref 7.350–7.450)
PH ART: 7.323 — AB (ref 7.350–7.450)
PO2 ART: 117 mmHg — AB (ref 83.0–108.0)
Patient temperature: 36.9
TCO2: 24 mmol/L (ref 22–32)
TCO2: 23 mmol/L (ref 22–32)
TCO2: 23 mmol/L (ref 22–32)
TCO2: 22 mmol/L (ref 22–32)
TCO2: 22 mmol/L (ref 22–32)
TCO2: 22 mmol/L (ref 22–32)
pCO2 arterial: 45.1 mmHg (ref 32.0–48.0)
pCO2 arterial: 43.4 mmHg (ref 32.0–48.0)
pCO2 arterial: 41.1 mmHg (ref 32.0–48.0)
pH, Arterial: 7.283 — ABNORMAL LOW (ref 7.350–7.450)
pH, Arterial: 7.304 — ABNORMAL LOW (ref 7.350–7.450)
pH, Arterial: 7.291 — ABNORMAL LOW (ref 7.350–7.450)
pO2, Arterial: 171 mmHg — ABNORMAL HIGH (ref 83.0–108.0)
pO2, Arterial: 347 mmHg — ABNORMAL HIGH (ref 83.0–108.0)
pO2, Arterial: 67 mmHg — ABNORMAL LOW (ref 83.0–108.0)
pO2, Arterial: 73 mmHg — ABNORMAL LOW (ref 83.0–108.0)
pO2, Arterial: 98 mmHg (ref 83.0–108.0)

## 2017-11-16 LAB — CBC
HCT: 30.7 % — ABNORMAL LOW (ref 39.0–52.0)
HCT: 27.6 % — ABNORMAL LOW (ref 39.0–52.0)
HEMOGLOBIN: 9.9 g/dL — AB (ref 13.0–17.0)
Hemoglobin: 9 g/dL — ABNORMAL LOW (ref 13.0–17.0)
MCH: 31.3 pg (ref 26.0–34.0)
MCH: 31.5 pg (ref 26.0–34.0)
MCHC: 32.2 g/dL (ref 30.0–36.0)
MCHC: 32.6 g/dL (ref 30.0–36.0)
MCV: 97.2 fL (ref 78.0–100.0)
MCV: 96.5 fL (ref 78.0–100.0)
PLATELETS: 205 10*3/uL (ref 150–400)
Platelets: 159 10*3/uL (ref 150–400)
RBC: 3.16 MIL/uL — AB (ref 4.22–5.81)
RBC: 2.86 MIL/uL — AB (ref 4.22–5.81)
RDW: 14.1 % (ref 11.5–15.5)
RDW: 14.3 % (ref 11.5–15.5)
WBC: 20.5 10*3/uL — AB (ref 4.0–10.5)
WBC: 18.9 10*3/uL — AB (ref 4.0–10.5)

## 2017-11-16 LAB — PLATELET COUNT: Platelets: 156 10*3/uL (ref 150–400)

## 2017-11-16 LAB — PROTIME-INR
INR: 1.5
PROTHROMBIN TIME: 18 s — AB (ref 11.4–15.2)

## 2017-11-16 LAB — CREATININE, SERUM
Creatinine, Ser: 1.33 mg/dL — ABNORMAL HIGH (ref 0.61–1.24)
GFR calc non Af Amer: 52 mL/min — ABNORMAL LOW (ref >60)

## 2017-11-16 LAB — GLUCOSE, CAPILLARY
GLUCOSE-CAPILLARY: 99 mg/dL (ref 65–99)
GLUCOSE-CAPILLARY: 109 mg/dL — AB (ref 65–99)
Glucose-Capillary: 106 mg/dL — ABNORMAL HIGH (ref 65–99)
Glucose-Capillary: 114 mg/dL — ABNORMAL HIGH (ref 65–99)
Glucose-Capillary: 129 mg/dL — ABNORMAL HIGH (ref 65–99)
Glucose-Capillary: 120 mg/dL — ABNORMAL HIGH (ref 65–99)
Glucose-Capillary: 157 mg/dL — ABNORMAL HIGH (ref 65–99)

## 2017-11-16 LAB — HEMOGLOBIN AND HEMATOCRIT, BLOOD
HEMATOCRIT: 24.7 % — AB (ref 39.0–52.0)
Hemoglobin: 8.1 g/dL — ABNORMAL LOW (ref 13.0–17.0)

## 2017-11-16 LAB — APTT: aPTT: 44 seconds — ABNORMAL HIGH (ref 24–36)

## 2017-11-16 LAB — POCT I-STAT 4, (NA,K, GLUC, HGB,HCT)
Glucose, Bld: 108 mg/dL — ABNORMAL HIGH (ref 65–99)
HEMATOCRIT: 29 % — AB (ref 39.0–52.0)
Hemoglobin: 9.9 g/dL — ABNORMAL LOW (ref 13.0–17.0)
Potassium: 4.2 mmol/L (ref 3.5–5.1)
Sodium: 144 mmol/L (ref 135–145)

## 2017-11-16 LAB — MAGNESIUM: MAGNESIUM: 3.1 mg/dL — AB (ref 1.7–2.4)

## 2017-11-16 LAB — PREPARE RBC (CROSSMATCH)

## 2017-11-16 SURGERY — REPLACEMENT, AORTIC VALVE, OPEN
Anesthesia: General | Site: Leg Upper | Laterality: Right

## 2017-11-16 MED ORDER — CHLORHEXIDINE GLUCONATE 4 % EX LIQD: 30.0000 mL | CUTANEOUS | Status: DC

## 2017-11-16 MED ORDER — PHENYLEPHRINE 40 MCG/ML (10ML) SYRINGE FOR IV PUSH (FOR BLOOD PRESSURE SUPPORT)
PREFILLED_SYRINGE | INTRAVENOUS | Status: AC
  Filled 2017-11-16: qty 10

## 2017-11-16 MED ORDER — SODIUM CHLORIDE 0.9 % IV SOLN
INTRAVENOUS | Status: DC
  Administered 2017-11-16: 16:00:00 via INTRAVENOUS

## 2017-11-16 MED ORDER — ACETAMINOPHEN 500 MG PO TABS
1000.0000 mg | ORAL_TABLET | Freq: Four times a day (QID) | ORAL | Status: AC
  Administered 2017-11-17 – 2017-11-21 (×15): 1000 mg via ORAL
  Filled 2017-11-16 (×16): qty 2

## 2017-11-16 MED ORDER — MILRINONE LACTATE IN DEXTROSE 20-5 MG/100ML-% IV SOLN: 0.3000 ug/kg/min | INTRAVENOUS | Status: DC

## 2017-11-16 MED ORDER — DOCUSATE SODIUM 100 MG PO CAPS
200.0000 mg | ORAL_CAPSULE | Freq: Every day | ORAL | Status: DC
  Administered 2017-11-17 – 2017-11-21 (×5): 200 mg via ORAL
  Filled 2017-11-16 (×8): qty 2

## 2017-11-16 MED ORDER — POTASSIUM CHLORIDE 10 MEQ/50ML IV SOLN: 10.0000 meq | INTRAVENOUS | Status: AC

## 2017-11-16 MED ORDER — ALBUMIN HUMAN 5 % IV SOLN
250.0000 mL | INTRAVENOUS | Status: AC | PRN
  Administered 2017-11-16 – 2017-11-17 (×4): 250 mL via INTRAVENOUS
  Filled 2017-11-16 (×2): qty 250

## 2017-11-16 MED ORDER — METOPROLOL TARTRATE 5 MG/5ML IV SOLN: 2.5000 mg | INTRAVENOUS | Status: DC | PRN

## 2017-11-16 MED ORDER — METOPROLOL TARTRATE 12.5 MG HALF TABLET
12.5000 mg | ORAL_TABLET | Freq: Once | ORAL | Status: AC
  Administered 2017-11-16: 12.5 mg via ORAL
  Filled 2017-11-16: qty 1

## 2017-11-16 MED ORDER — ACETAMINOPHEN 160 MG/5ML PO SOLN
1000.0000 mg | Freq: Four times a day (QID) | ORAL | Status: DC
  Administered 2017-11-16: 1000 mg
  Filled 2017-11-16: qty 40.6

## 2017-11-16 MED ORDER — MORPHINE SULFATE (PF) 2 MG/ML IV SOLN: 1.0000 mg | INTRAVENOUS | Status: DC | PRN

## 2017-11-16 MED ORDER — SODIUM CHLORIDE 0.9 % IJ SOLN
INTRAMUSCULAR | Status: AC
  Filled 2017-11-16: qty 20

## 2017-11-16 MED ORDER — ARTIFICIAL TEARS OPHTHALMIC OINT
TOPICAL_OINTMENT | OPHTHALMIC | Status: DC | PRN
  Administered 2017-11-16: 1 via OPHTHALMIC

## 2017-11-16 MED ORDER — CHLORHEXIDINE GLUCONATE 0.12% ORAL RINSE (MEDLINE KIT)
15.0000 mL | Freq: Two times a day (BID) | OROMUCOSAL | Status: DC
  Administered 2017-11-16: 15 mL via OROMUCOSAL

## 2017-11-16 MED ORDER — SODIUM CHLORIDE 0.9 % IR SOLN
Status: DC | PRN
  Administered 2017-11-16: 5000 mL

## 2017-11-16 MED ORDER — PROTAMINE SULFATE 10 MG/ML IV SOLN
INTRAVENOUS | Status: AC
  Filled 2017-11-16: qty 25

## 2017-11-16 MED ORDER — SODIUM CHLORIDE 0.9 % IV SOLN: Freq: Once | INTRAVENOUS | Status: DC

## 2017-11-16 MED ORDER — MORPHINE SULFATE (PF) 4 MG/ML IV SOLN: 1.0000 mg | INTRAVENOUS | Status: DC | PRN

## 2017-11-16 MED ORDER — CHLORHEXIDINE GLUCONATE 0.12 % MT SOLN
15.0000 mL | OROMUCOSAL | Status: AC
  Administered 2017-11-16: 15 mL via OROMUCOSAL

## 2017-11-16 MED ORDER — ONDANSETRON HCL 4 MG/2ML IJ SOLN
INTRAMUSCULAR | Status: AC
  Filled 2017-11-16: qty 2

## 2017-11-16 MED ORDER — PROTAMINE SULFATE 10 MG/ML IV SOLN
INTRAVENOUS | Status: DC | PRN
  Administered 2017-11-16: 300 mg via INTRAVENOUS

## 2017-11-16 MED ORDER — LACTATED RINGERS IV SOLN
INTRAVENOUS | Status: DC | PRN
  Administered 2017-11-16: 07:00:00 via INTRAVENOUS

## 2017-11-16 MED ORDER — HEPARIN SODIUM (PORCINE) 1000 UNIT/ML IJ SOLN
INTRAMUSCULAR | Status: AC
  Filled 2017-11-16: qty 1

## 2017-11-16 MED ORDER — MAGNESIUM SULFATE 4 GM/100ML IV SOLN
4.0000 g | Freq: Once | INTRAVENOUS | Status: AC
  Administered 2017-11-16: 4 g via INTRAVENOUS
  Filled 2017-11-16: qty 100

## 2017-11-16 MED ORDER — MORPHINE SULFATE (PF) 4 MG/ML IV SOLN
1.0000 mg | INTRAVENOUS | Status: DC | PRN
  Administered 2017-11-16 – 2017-11-18 (×3): 2 mg via INTRAVENOUS
  Administered 2017-11-19: 1 mg via INTRAVENOUS
  Filled 2017-11-16 (×4): qty 1

## 2017-11-16 MED ORDER — HEPARIN SODIUM (PORCINE) 1000 UNIT/ML IJ SOLN
INTRAMUSCULAR | Status: DC | PRN
  Administered 2017-11-16: 32000 [IU] via INTRAVENOUS
  Administered 2017-11-16: 10000 [IU] via INTRAVENOUS

## 2017-11-16 MED ORDER — FENTANYL CITRATE (PF) 250 MCG/5ML IJ SOLN
INTRAMUSCULAR | Status: AC
  Filled 2017-11-16: qty 5

## 2017-11-16 MED ORDER — TRAMADOL HCL 50 MG PO TABS
50.0000 mg | ORAL_TABLET | ORAL | Status: DC | PRN
  Administered 2017-11-17: 50 mg via ORAL
  Administered 2017-11-20 – 2017-11-23 (×4): 100 mg via ORAL
  Filled 2017-11-16 (×3): qty 2
  Filled 2017-11-16: qty 1
  Filled 2017-11-16: qty 2

## 2017-11-16 MED ORDER — LACTATED RINGERS IV SOLN: INTRAVENOUS | Status: DC

## 2017-11-16 MED ORDER — BISACODYL 5 MG PO TBEC
10.0000 mg | DELAYED_RELEASE_TABLET | Freq: Every day | ORAL | Status: DC
  Administered 2017-11-17 – 2017-11-21 (×5): 10 mg via ORAL
  Filled 2017-11-16 (×6): qty 2

## 2017-11-16 MED ORDER — MIDAZOLAM HCL 10 MG/2ML IJ SOLN
INTRAMUSCULAR | Status: AC
  Filled 2017-11-16: qty 2

## 2017-11-16 MED ORDER — ROCURONIUM BROMIDE 10 MG/ML (PF) SYRINGE
PREFILLED_SYRINGE | INTRAVENOUS | Status: AC
  Filled 2017-11-16: qty 10

## 2017-11-16 MED ORDER — DEXTROSE 5 % IV SOLN
INTRAVENOUS | Status: DC | PRN
  Administered 2017-11-16: 1.5 g via INTRAVENOUS
  Administered 2017-11-16: .75 g via INTRAVENOUS

## 2017-11-16 MED ORDER — SODIUM CHLORIDE 0.45 % IV SOLN
INTRAVENOUS | Status: DC | PRN
Start: 2017-11-16 — End: 2017-11-17

## 2017-11-16 MED ORDER — LACTATED RINGERS IV SOLN
INTRAVENOUS | Status: DC
  Administered 2017-11-16: 21:00:00 via INTRAVENOUS

## 2017-11-16 MED ORDER — MILRINONE LACTATE IN DEXTROSE 20-5 MG/100ML-% IV SOLN
0.1250 ug/kg/min | INTRAVENOUS | Status: AC
  Administered 2017-11-16: 0.25 ug/kg/min via INTRAVENOUS
  Filled 2017-11-16: qty 100

## 2017-11-16 MED ORDER — SODIUM CHLORIDE 0.9 % IV SOLN
INTRAVENOUS | Status: DC | PRN
  Administered 2017-11-16: 10:00:00 via INTRAVENOUS

## 2017-11-16 MED ORDER — HEPARIN SODIUM (PORCINE) 1000 UNIT/ML IJ SOLN
INTRAMUSCULAR | Status: AC
  Filled 2017-11-16: qty 2

## 2017-11-16 MED ORDER — ONDANSETRON HCL 4 MG/2ML IJ SOLN
INTRAMUSCULAR | Status: DC | PRN
  Administered 2017-11-16: 4 mg via INTRAVENOUS

## 2017-11-16 MED ORDER — FENTANYL CITRATE (PF) 250 MCG/5ML IJ SOLN
INTRAMUSCULAR | Status: AC
  Filled 2017-11-16: qty 20

## 2017-11-16 MED ORDER — NOREPINEPHRINE BITARTRATE 1 MG/ML IV SOLN
0.0000 ug/min | INTRAVENOUS | Status: DC
  Administered 2017-11-16 – 2017-11-17 (×2): 4 ug/min via INTRAVENOUS
  Filled 2017-11-16 (×2): qty 4

## 2017-11-16 MED ORDER — PANTOPRAZOLE SODIUM 40 MG PO TBEC
40.0000 mg | DELAYED_RELEASE_TABLET | Freq: Every day | ORAL | Status: DC
  Administered 2017-11-18 – 2017-11-24 (×7): 40 mg via ORAL
  Filled 2017-11-16 (×8): qty 1

## 2017-11-16 MED ORDER — DOPAMINE-DEXTROSE 3.2-5 MG/ML-% IV SOLN
3.0000 ug/kg/min | INTRAVENOUS | Status: DC
  Administered 2017-11-18 – 2017-11-19 (×2): 3 ug/kg/min via INTRAVENOUS
  Filled 2017-11-16 (×2): qty 250

## 2017-11-16 MED ORDER — CEFUROXIME SODIUM 1.5 G IV SOLR
1.5000 g | Freq: Two times a day (BID) | INTRAVENOUS | Status: AC
  Administered 2017-11-16 – 2017-11-18 (×4): 1.5 g via INTRAVENOUS
  Filled 2017-11-16 (×4): qty 1.5

## 2017-11-16 MED ORDER — MIDAZOLAM HCL 2 MG/2ML IJ SOLN: 2.0000 mg | INTRAMUSCULAR | Status: DC | PRN

## 2017-11-16 MED ORDER — CHLORHEXIDINE GLUCONATE 0.12 % MT SOLN
15.0000 mL | Freq: Once | OROMUCOSAL | Status: AC
  Administered 2017-11-16: 15 mL via OROMUCOSAL
  Filled 2017-11-16: qty 15

## 2017-11-16 MED ORDER — ORAL CARE MOUTH RINSE
15.0000 mL | OROMUCOSAL | Status: DC
  Administered 2017-11-16 – 2017-11-17 (×4): 15 mL via OROMUCOSAL

## 2017-11-16 MED ORDER — ONDANSETRON HCL 4 MG/2ML IJ SOLN
4.0000 mg | Freq: Four times a day (QID) | INTRAMUSCULAR | Status: DC | PRN
  Administered 2017-11-17 – 2017-11-19 (×3): 4 mg via INTRAVENOUS
  Filled 2017-11-16 (×3): qty 2

## 2017-11-16 MED ORDER — PROPOFOL 10 MG/ML IV BOLUS
INTRAVENOUS | Status: AC
  Filled 2017-11-16: qty 20

## 2017-11-16 MED ORDER — LACTATED RINGERS IV SOLN
500.0000 mL | Freq: Once | INTRAVENOUS | Status: AC | PRN
  Administered 2017-11-16: 500 mL via INTRAVENOUS

## 2017-11-16 MED ORDER — SODIUM CHLORIDE 0.9 % IV SOLN: INTRAVENOUS | Status: DC

## 2017-11-16 MED ORDER — FENTANYL CITRATE (PF) 250 MCG/5ML IJ SOLN
INTRAMUSCULAR | Status: DC | PRN
  Administered 2017-11-16: 1150 ug via INTRAVENOUS
  Administered 2017-11-16: 100 ug via INTRAVENOUS
  Administered 2017-11-16: 50 ug via INTRAVENOUS
  Administered 2017-11-16: 150 ug via INTRAVENOUS
  Administered 2017-11-16: 50 ug via INTRAVENOUS

## 2017-11-16 MED ORDER — NITROGLYCERIN IN D5W 200-5 MCG/ML-% IV SOLN: 0.0000 ug/min | INTRAVENOUS | Status: DC

## 2017-11-16 MED ORDER — ASPIRIN 81 MG PO CHEW: 324.0000 mg | CHEWABLE_TABLET | Freq: Every day | ORAL | Status: DC

## 2017-11-16 MED ORDER — MUPIROCIN 2 % EX OINT
TOPICAL_OINTMENT | Freq: Two times a day (BID) | CUTANEOUS | Status: AC
  Administered 2017-11-16 – 2017-11-18 (×5): via NASAL
  Filled 2017-11-16: qty 22

## 2017-11-16 MED ORDER — OXYCODONE HCL 5 MG PO TABS
5.0000 mg | ORAL_TABLET | ORAL | Status: DC | PRN
  Administered 2017-11-18 – 2017-11-19 (×4): 5 mg via ORAL
  Filled 2017-11-16 (×4): qty 1

## 2017-11-16 MED ORDER — BISACODYL 10 MG RE SUPP: 10.0000 mg | Freq: Every day | RECTAL | Status: DC

## 2017-11-16 MED ORDER — PHENYLEPHRINE HCL 10 MG/ML IJ SOLN
0.0000 ug/min | INTRAMUSCULAR | Status: DC
  Administered 2017-11-16: 80 ug/min via INTRAVENOUS
  Administered 2017-11-17: 20 ug/min via INTRAVENOUS
  Filled 2017-11-16 (×3): qty 2

## 2017-11-16 MED ORDER — VANCOMYCIN HCL 1000 MG IV SOLR
INTRAVENOUS | Status: DC | PRN
  Administered 2017-11-16: 1250 mg via INTRAVENOUS

## 2017-11-16 MED ORDER — ARTIFICIAL TEARS OPHTHALMIC OINT
TOPICAL_OINTMENT | OPHTHALMIC | Status: AC
  Filled 2017-11-16: qty 14

## 2017-11-16 MED ORDER — HEMOSTATIC AGENTS (NO CHARGE) OPTIME
TOPICAL | Status: DC | PRN
  Administered 2017-11-16: 1 via TOPICAL

## 2017-11-16 MED ORDER — SODIUM CHLORIDE 0.9 % IJ SOLN
OROMUCOSAL | Status: DC | PRN
  Administered 2017-11-16 (×5): 4 mL via TOPICAL

## 2017-11-16 MED ORDER — ALBUMIN HUMAN 5 % IV SOLN
INTRAVENOUS | Status: DC | PRN
  Administered 2017-11-16: 15:00:00 via INTRAVENOUS

## 2017-11-16 MED ORDER — SODIUM CHLORIDE 0.9% FLUSH: 3.0000 mL | INTRAVENOUS | Status: DC | PRN

## 2017-11-16 MED ORDER — FAMOTIDINE IN NACL 20-0.9 MG/50ML-% IV SOLN
20.0000 mg | Freq: Two times a day (BID) | INTRAVENOUS | Status: DC
  Administered 2017-11-16: 20 mg via INTRAVENOUS

## 2017-11-16 MED ORDER — PROPOFOL 10 MG/ML IV BOLUS
INTRAVENOUS | Status: DC | PRN
  Administered 2017-11-16: 20 mg via INTRAVENOUS

## 2017-11-16 MED ORDER — VANCOMYCIN HCL IN DEXTROSE 1-5 GM/200ML-% IV SOLN
1000.0000 mg | Freq: Once | INTRAVENOUS | Status: AC
  Administered 2017-11-16: 1000 mg via INTRAVENOUS
  Filled 2017-11-16: qty 200

## 2017-11-16 MED ORDER — PROTAMINE SULFATE 10 MG/ML IV SOLN
INTRAVENOUS | Status: AC
  Filled 2017-11-16: qty 5

## 2017-11-16 MED ORDER — ACETAMINOPHEN 160 MG/5ML PO SOLN: 650.0000 mg | Freq: Once | ORAL | Status: AC

## 2017-11-16 MED ORDER — TRANEXAMIC ACID 1000 MG/10ML IV SOLN
1.5000 mg/kg/h | INTRAVENOUS | Status: DC
  Filled 2017-11-16: qty 10

## 2017-11-16 MED ORDER — ASPIRIN EC 325 MG PO TBEC
325.0000 mg | DELAYED_RELEASE_TABLET | Freq: Every day | ORAL | Status: DC
  Administered 2017-11-17 – 2017-11-19 (×3): 325 mg via ORAL
  Filled 2017-11-16 (×3): qty 1

## 2017-11-16 MED ORDER — LACTATED RINGERS IV SOLN
INTRAVENOUS | Status: DC | PRN
  Administered 2017-11-16 (×2): via INTRAVENOUS

## 2017-11-16 MED ORDER — SODIUM CHLORIDE 0.9% FLUSH
3.0000 mL | Freq: Two times a day (BID) | INTRAVENOUS | Status: DC
  Administered 2017-11-17 – 2017-11-19 (×4): 3 mL via INTRAVENOUS

## 2017-11-16 MED ORDER — SODIUM BICARBONATE 8.4 % IV SOLN
50.0000 meq | Freq: Once | INTRAVENOUS | Status: AC
  Administered 2017-11-16: 50 meq via INTRAVENOUS

## 2017-11-16 MED ORDER — MIDAZOLAM HCL 5 MG/5ML IJ SOLN
INTRAMUSCULAR | Status: DC | PRN
  Administered 2017-11-16: 2 mg via INTRAVENOUS
  Administered 2017-11-16: 1 mg via INTRAVENOUS
  Administered 2017-11-16: 2 mg via INTRAVENOUS
  Administered 2017-11-16: 1 mg via INTRAVENOUS
  Administered 2017-11-16 (×2): 2 mg via INTRAVENOUS

## 2017-11-16 MED ORDER — SODIUM CHLORIDE 0.9 % IV SOLN
250.0000 mL | INTRAVENOUS | Status: DC
  Administered 2017-11-19: 500 mL via INTRAVENOUS

## 2017-11-16 MED ORDER — ACETAMINOPHEN 650 MG RE SUPP
650.0000 mg | Freq: Once | RECTAL | Status: AC
  Administered 2017-11-16: 650 mg via RECTAL

## 2017-11-16 MED ORDER — DEXMEDETOMIDINE HCL IN NACL 200 MCG/50ML IV SOLN
INTRAVENOUS | Status: AC
  Filled 2017-11-16: qty 50

## 2017-11-16 MED ORDER — SODIUM CHLORIDE 0.9 % IV SOLN
0.0000 ug/kg/h | INTRAVENOUS | Status: DC
  Administered 2017-11-16: 0.7 ug/kg/h via INTRAVENOUS
  Administered 2017-11-17: 0.3 ug/kg/h via INTRAVENOUS
  Filled 2017-11-16 (×3): qty 2

## 2017-11-16 MED ORDER — ROCURONIUM BROMIDE 10 MG/ML (PF) SYRINGE
PREFILLED_SYRINGE | INTRAVENOUS | Status: DC | PRN
  Administered 2017-11-16: 50 mg via INTRAVENOUS
  Administered 2017-11-16: 20 mg via INTRAVENOUS
  Administered 2017-11-16 (×2): 50 mg via INTRAVENOUS

## 2017-11-16 MED ORDER — SODIUM CHLORIDE 0.9 % IV SOLN
INTRAVENOUS | Status: DC
  Filled 2017-11-16 (×2): qty 1

## 2017-11-16 MED ORDER — INSULIN REGULAR BOLUS VIA INFUSION
0.0000 [IU] | Freq: Three times a day (TID) | INTRAVENOUS | Status: DC
  Filled 2017-11-16: qty 10

## 2017-11-16 SURGICAL SUPPLY — 172 items
ADAPTER CARDIO PERF ANTE/RETRO (ADAPTER) ×15 IMPLANT
APPLICATOR COTTON TIP 6IN STRL (MISCELLANEOUS) IMPLANT
ARTERIAL PRESSURE LINE (MISCELLANEOUS) ×10 IMPLANT
ARTICLIP LAA PROCLIP II 40 (Clip) ×5 IMPLANT
BAG DECANTER FOR FLEXI CONT (MISCELLANEOUS) ×10 IMPLANT
BANDAGE ACE 4X5 VEL STRL LF (GAUZE/BANDAGES/DRESSINGS) ×5 IMPLANT
BANDAGE ACE 6X5 VEL STRL LF (GAUZE/BANDAGES/DRESSINGS) ×5 IMPLANT
BASKET HEART (ORDER IN 25'S) (MISCELLANEOUS) ×1
BASKET HEART (ORDER IN 25S) (MISCELLANEOUS) ×4 IMPLANT
BLADE CLIPPER SURG (BLADE) ×5 IMPLANT
BLADE STERNUM SYSTEM 6 (BLADE) ×10 IMPLANT
BLADE SURG 11 STRL SS (BLADE) ×15 IMPLANT
BNDG GAUZE ELAST 4 BULKY (GAUZE/BANDAGES/DRESSINGS) ×5 IMPLANT
CANISTER SUCT 3000ML PPV (MISCELLANEOUS) ×5 IMPLANT
CANN PRFSN 3/8X14X24FR PCFC (MISCELLANEOUS)
CANN PRFSN 3/8XCNCT ST RT ANG (MISCELLANEOUS)
CANNULA EZ GLIDE AORTIC 21FR (CANNULA) ×10 IMPLANT
CANNULA FEM VENOUS REMOTE 22FR (CANNULA) ×5 IMPLANT
CANNULA GUNDRY RCSP 15FR (MISCELLANEOUS) ×10 IMPLANT
CANNULA PRFSN 3/8X14X24FR PCFC (MISCELLANEOUS) IMPLANT
CANNULA PRFSN 3/8XCNCT RT ANG (MISCELLANEOUS) IMPLANT
CANNULA SOFTFLOW AORTIC 7M21FR (CANNULA) ×5 IMPLANT
CANNULA SUMP PERICARDIAL (CANNULA) ×5 IMPLANT
CANNULA VEN MTL TIP RT (MISCELLANEOUS)
CANNULA VENNOUS METAL TIP 20FR (CANNULA) ×5 IMPLANT
CATH CPB KIT OWEN (MISCELLANEOUS) ×5 IMPLANT
CATH FOLEY 2WAY SLVR  5CC 14FR (CATHETERS)
CATH FOLEY 2WAY SLVR 5CC 14FR (CATHETERS) IMPLANT
CATH HEART VENT LEFT (CATHETERS) ×4 IMPLANT
CATH THORACIC 28FR RT ANG (CATHETERS) IMPLANT
CATH THORACIC 36FR (CATHETERS) IMPLANT
CATH THORACIC 36FR RT ANG (CATHETERS) IMPLANT
CLAMP ISOLATOR SYNERGY LG (MISCELLANEOUS) ×5 IMPLANT
CLAMP OLL ABLATION (MISCELLANEOUS) ×5 IMPLANT
CLIP FOGARTY SPRING 6M (CLIP) IMPLANT
CLIP VESOCCLUDE MED 24/CT (CLIP) IMPLANT
CLIP VESOCCLUDE SM WIDE 24/CT (CLIP) IMPLANT
CONN 1/2X1/2X1/2  BEN (MISCELLANEOUS) ×1
CONN 1/2X1/2X1/2 BEN (MISCELLANEOUS) ×4 IMPLANT
CONN 3/8X1/2 ST GISH (MISCELLANEOUS) ×10 IMPLANT
CONN ST 1/4X3/8  BEN (MISCELLANEOUS) ×1
CONN ST 1/4X3/8 BEN (MISCELLANEOUS) ×4 IMPLANT
CONT SPEC 4OZ CLIKSEAL STRL BL (MISCELLANEOUS) ×15 IMPLANT
COUNTER NEEDLE 20 DBL MAG RED (NEEDLE) ×10 IMPLANT
COVER PROBE W GEL 5X96 (DRAPES) ×5 IMPLANT
COVER SURGICAL LIGHT HANDLE (MISCELLANEOUS) IMPLANT
CRADLE DONUT ADULT HEAD (MISCELLANEOUS) ×5 IMPLANT
DEVICE ATRICLIP LAA PRCLPII 40 (Clip) ×4 IMPLANT
DEVICE SUT CK QUICK LOAD MINI (Prosthesis & Implant Heart) ×20 IMPLANT
DRAIN CHANNEL 32F RND 10.7 FF (WOUND CARE) ×10 IMPLANT
DRAPE BILATERAL SPLIT (DRAPES) ×5 IMPLANT
DRAPE CARDIOVASCULAR INCISE (DRAPES)
DRAPE CV SPLIT W-CLR ANES SCRN (DRAPES) ×5 IMPLANT
DRAPE INCISE IOBAN 66X45 STRL (DRAPES) ×10 IMPLANT
DRAPE SLUSH/WARMER DISC (DRAPES) ×5 IMPLANT
DRAPE SRG 135X102X78XABS (DRAPES) IMPLANT
DRSG AQUACEL AG ADV 3.5X14 (GAUZE/BANDAGES/DRESSINGS) ×5 IMPLANT
DRSG COVADERM 4X14 (GAUZE/BANDAGES/DRESSINGS) ×10 IMPLANT
ELECT BLADE 4.0 EZ CLEAN MEGAD (MISCELLANEOUS) ×5
ELECT REM PT RETURN 9FT ADLT (ELECTROSURGICAL) ×10
ELECTRODE BLDE 4.0 EZ CLN MEGD (MISCELLANEOUS) ×4 IMPLANT
ELECTRODE REM PT RTRN 9FT ADLT (ELECTROSURGICAL) ×8 IMPLANT
FELT TEFLON 1X6 (MISCELLANEOUS) ×15 IMPLANT
GAUZE SPONGE 4X4 12PLY STRL (GAUZE/BANDAGES/DRESSINGS) ×5 IMPLANT
GAUZE SPONGE 4X4 12PLY STRL LF (GAUZE/BANDAGES/DRESSINGS) ×5 IMPLANT
GLOVE BIO SURGEON STRL SZ 6 (GLOVE) ×10 IMPLANT
GLOVE BIO SURGEON STRL SZ 6.5 (GLOVE) ×20 IMPLANT
GLOVE BIO SURGEON STRL SZ7 (GLOVE) ×20 IMPLANT
GLOVE BIO SURGEON STRL SZ7.5 (GLOVE) IMPLANT
GLOVE BIO SURGEON STRL SZ8 (GLOVE) ×5 IMPLANT
GLOVE BIOGEL PI IND STRL 6 (GLOVE) ×8 IMPLANT
GLOVE BIOGEL PI INDICATOR 6 (GLOVE) ×2
GLOVE ORTHO TXT STRL SZ7.5 (GLOVE) ×20 IMPLANT
GOWN STRL REUS W/ TWL LRG LVL3 (GOWN DISPOSABLE) ×24 IMPLANT
GOWN STRL REUS W/TWL LRG LVL3 (GOWN DISPOSABLE) ×6
HEMOSTAT POWDER SURGIFOAM 1G (HEMOSTASIS) ×25 IMPLANT
INSERT FOGARTY XLG (MISCELLANEOUS) ×5 IMPLANT
KIT BASIN OR (CUSTOM PROCEDURE TRAY) ×5 IMPLANT
KIT DILATOR VASC 18G NDL (KITS) ×5 IMPLANT
KIT DRAINAGE VACCUM ASSIST (KITS) ×5 IMPLANT
KIT ROOM TURNOVER OR (KITS) ×5 IMPLANT
KIT SUCTION CATH 14FR (SUCTIONS) ×15 IMPLANT
KIT SUT CK MINI COMBO 4X17 (Prosthesis & Implant Heart) ×5 IMPLANT
KIT VASOVIEW HEMOPRO VH 3000 (KITS) ×5 IMPLANT
LEAD PACING MYOCARDI (MISCELLANEOUS) ×5 IMPLANT
LINE VENT (MISCELLANEOUS) ×5 IMPLANT
LOOP VESSEL SUPERMAXI WHITE (MISCELLANEOUS) ×5 IMPLANT
MARKER GRAFT CORONARY BYPASS (MISCELLANEOUS) ×15 IMPLANT
NS IRRIG 1000ML POUR BTL (IV SOLUTION) ×25 IMPLANT
PACK E OPEN HEART (SUTURE) IMPLANT
PACK OPEN HEART (CUSTOM PROCEDURE TRAY) ×5 IMPLANT
PAD ARMBOARD 7.5X6 YLW CONV (MISCELLANEOUS) ×10 IMPLANT
PAD ELECT DEFIB RADIOL ZOLL (MISCELLANEOUS) ×5 IMPLANT
PENCIL BUTTON HOLSTER BLD 10FT (ELECTRODE) ×10 IMPLANT
PROBE CRYO2-ABLATION MALLABLE (MISCELLANEOUS) ×5 IMPLANT
PUNCH AORTIC ROTATE 4.0MM (MISCELLANEOUS) ×5 IMPLANT
PUNCH AORTIC ROTATE 4.5MM 8IN (MISCELLANEOUS) IMPLANT
PUNCH AORTIC ROTATE 5MM 8IN (MISCELLANEOUS) IMPLANT
SET CARDIOPLEGIA MPS 5001102 (MISCELLANEOUS) ×5 IMPLANT
SET IRRIG TUBING LAPAROSCOPIC (IRRIGATION / IRRIGATOR) ×10 IMPLANT
SOLUTION ANTI FOG 6CC (MISCELLANEOUS) ×10 IMPLANT
SPONGE LAP 18X18 X RAY DECT (DISPOSABLE) IMPLANT
SPONGE LAP 4X18 X RAY DECT (DISPOSABLE) IMPLANT
STOPCOCK 4 WAY LG BORE MALE ST (IV SETS) ×5 IMPLANT
SUCKER INTRACARDIAC WEIGHTED (SUCKER) ×5 IMPLANT
SUT BONE WAX W31G (SUTURE) ×5 IMPLANT
SUT ETHIBON 2 0 V 52N 30 (SUTURE) ×20 IMPLANT
SUT ETHIBON EXCEL 2-0 V-5 (SUTURE) ×5 IMPLANT
SUT ETHIBOND (SUTURE) ×10 IMPLANT
SUT ETHIBOND 2 0 SH (SUTURE) ×20 IMPLANT
SUT ETHIBOND 2 0 SH 36X2 (SUTURE) ×20 IMPLANT
SUT ETHIBOND 2 0 V4 (SUTURE) IMPLANT
SUT ETHIBOND 2 0V4 GREEN (SUTURE) IMPLANT
SUT ETHIBOND 2-0 RB-1 WHT (SUTURE) ×5 IMPLANT
SUT ETHIBOND 4 0 RB 1 (SUTURE) IMPLANT
SUT ETHIBOND 4 0 TF (SUTURE) IMPLANT
SUT ETHIBOND 5 0 C 1 30 (SUTURE) IMPLANT
SUT ETHIBOND V-5 VALVE (SUTURE) IMPLANT
SUT ETHIBOND X763 2 0 SH 1 (SUTURE) ×20 IMPLANT
SUT MNCRL AB 3-0 PS2 18 (SUTURE) ×10 IMPLANT
SUT MNCRL AB 4-0 PS2 18 (SUTURE) ×5 IMPLANT
SUT PDS AB 1 CTX 36 (SUTURE) ×10 IMPLANT
SUT PROLENE 2 0 SH DA (SUTURE) IMPLANT
SUT PROLENE 3 0 SH 1 (SUTURE) IMPLANT
SUT PROLENE 3 0 SH DA (SUTURE) ×5 IMPLANT
SUT PROLENE 3 0 SH1 36 (SUTURE) ×15 IMPLANT
SUT PROLENE 4 0 RB 1 (SUTURE) ×8
SUT PROLENE 4 0 SH DA (SUTURE) ×15 IMPLANT
SUT PROLENE 4-0 RB1 .5 CRCL 36 (SUTURE) ×32 IMPLANT
SUT PROLENE 5 0 C 1 36 (SUTURE) ×15 IMPLANT
SUT PROLENE 6 0 C 1 30 (SUTURE) ×10 IMPLANT
SUT PROLENE 7.0 RB 3 (SUTURE) ×15 IMPLANT
SUT PROLENE 8 0 BV175 6 (SUTURE) ×10 IMPLANT
SUT PROLENE BLUE 7 0 (SUTURE) ×5 IMPLANT
SUT PROLENE POLY MONO (SUTURE) IMPLANT
SUT SILK  1 MH (SUTURE) ×7
SUT SILK 1 MH (SUTURE) ×28 IMPLANT
SUT SILK 1 TIES 10X30 (SUTURE) ×5 IMPLANT
SUT SILK 2 0 SH CR/8 (SUTURE) ×10 IMPLANT
SUT SILK 2 0 TIES 10X30 (SUTURE) ×5 IMPLANT
SUT SILK 2 0 TIES 17X18 (SUTURE) ×2
SUT SILK 2-0 18XBRD TIE BLK (SUTURE) ×8 IMPLANT
SUT SILK 3 0 SH CR/8 (SUTURE) ×5 IMPLANT
SUT SILK 4 0 TIE 10X30 (SUTURE) ×10 IMPLANT
SUT STEEL 6MS V (SUTURE) ×5 IMPLANT
SUT STEEL STERNAL CCS#1 18IN (SUTURE) IMPLANT
SUT STEEL SZ 6 DBL 3X14 BALL (SUTURE) ×10 IMPLANT
SUT TEM PAC WIRE 2 0 SH (SUTURE) ×20 IMPLANT
SUT VIC AB 0 CTX 27 (SUTURE) ×10 IMPLANT
SUT VIC AB 1 CTX 36 (SUTURE)
SUT VIC AB 1 CTX36XBRD ANBCTR (SUTURE) IMPLANT
SUT VIC AB 2-0 CT1 27 (SUTURE) ×1
SUT VIC AB 2-0 CT1 TAPERPNT 27 (SUTURE) ×4 IMPLANT
SUT VIC AB 2-0 CTX 27 (SUTURE) IMPLANT
SUT VIC AB 3-0 SH 27 (SUTURE)
SUT VIC AB 3-0 SH 27X BRD (SUTURE) IMPLANT
SUT VIC AB 3-0 X1 27 (SUTURE) ×10 IMPLANT
SUT VICRYL 4-0 PS2 18IN ABS (SUTURE) IMPLANT
SYSTEM SAHARA CHEST DRAIN ATS (WOUND CARE) ×10 IMPLANT
TAPE CLOTH SURG 4X10 WHT LF (GAUZE/BANDAGES/DRESSINGS) ×5 IMPLANT
TAPE PAPER 3X10 WHT MICROPORE (GAUZE/BANDAGES/DRESSINGS) ×5 IMPLANT
TOWEL GREEN STERILE (TOWEL DISPOSABLE) ×10 IMPLANT
TOWEL GREEN STERILE FF (TOWEL DISPOSABLE) ×10 IMPLANT
TOWEL OR 17X26 10 PK STRL BLUE (TOWEL DISPOSABLE) IMPLANT
TRAY CATH LUMEN 1 20CM STRL (SET/KITS/TRAYS/PACK) ×5 IMPLANT
TRAY FOLEY SILVER 16FR TEMP (SET/KITS/TRAYS/PACK) ×5 IMPLANT
TUBING INSUFFLATION (TUBING) ×5 IMPLANT
UNDERPAD 30X30 (UNDERPADS AND DIAPERS) ×5 IMPLANT
VALVE MAGNA EASE AORTIC 23MM (Prosthesis & Implant Heart) ×5 IMPLANT
VALVE MAGNA MITRAL 27MM (Prosthesis & Implant Heart) ×5 IMPLANT
VENT LEFT HEART 12002 (CATHETERS) ×5
WATER STERILE IRR 1000ML POUR (IV SOLUTION) ×10 IMPLANT

## 2017-11-16 NOTE — Anesthesia Procedure Notes (Signed)
Procedure Name: Intubation Date/Time: 11/16/2017 7:57 AM Performed by: Inda Coke, CRNA Pre-anesthesia Checklist: Patient identified, Emergency Drugs available, Suction available and Patient being monitored Patient Re-evaluated:Patient Re-evaluated prior to induction Oxygen Delivery Method: Circle System Utilized Preoxygenation: Pre-oxygenation with 100% oxygen Induction Type: IV induction Ventilation: Mask ventilation without difficulty Laryngoscope Size: Mac and 4 Grade View: Grade II Tube type: Oral Tube size: 8.0 mm Number of attempts: 1 Airway Equipment and Method: Stylet and Oral airway Placement Confirmation: ETT inserted through vocal cords under direct vision,  positive ETCO2 and breath sounds checked- equal and bilateral Secured at: 22 cm Tube secured with: Tape Dental Injury: Teeth and Oropharynx as per pre-operative assessment

## 2017-11-16 NOTE — Interval H&P Note (Signed)
History and Physical Interval Note:  11/16/2017 6:48 AM  Brandon [name]  has presented today for surgery, with the diagnosis of Aortic Stenosis,Aortic Insufficiency,mitral regurgitation, coronary artery disease, atrial Fibrillation  The various methods of treatment have been discussed with the patient and family. After consideration of risks, benefits and other options for treatment, the patient has consented to  Procedure(s) with comments: AORTIC VALVE REPLACEMENT (AVR) (N/A) MITRAL VALVE REPAIR (MVR) OR REPLACEMENT (N/A) - POSSIBLE REPLACEMENT CORONARY ARTERY BYPASS GRAFTING (CABG) (N/A) MAZE (N/A) TRANSESOPHAGEAL ECHOCARDIOGRAM (TEE) (N/A) as a surgical intervention .  The patient's history has been reviewed, patient examined, no change in status, stable for surgery.  I have reviewed the patient's chart and labs.  Questions were answered to the patient's satisfaction.     Rexene Alberts

## 2017-11-16 NOTE — Progress Notes (Signed)
CT surgery p.m. Rounds  Sedated on ventilator after double valve replacement Vasodilated requiring phenylephrine high dose will substitute with norepinephrine. Cardiac index 2.4 AV paced for underlying bradycardia

## 2017-11-16 NOTE — Brief Op Note (Signed)
11/16/2017  1:40 PM  PATIENT:  Brandon [name]  71 y.o. male  PRE-OPERATIVE DIAGNOSIS:  Aortic Stenosis,Aortic Insufficiency,mitral regurgitation, coronary artery disease, atrial Fibrillation  POST-OPERATIVE DIAGNOSIS:  Aortic Stenosis,Aortic Insufficiency,mitral regurgitation, coronary artery disease, atrial Fibrillation  PROCEDURE:  Procedure(s) with comments: AORTIC VALVE REPLACEMENT (AVR) (N/A) MITRAL VALVE  REPLACEMENT (N/A CORONARY ARTERY BYPASS GRAFTING (CABG) x 1 (N/A) MAZE (N/A) TRANSESOPHAGEAL ECHOCARDIOGRAM (TEE) (N/A) ARTERIAL LINE INSERTION -RIGHT FEMORAL (Right) ENDOVEIN HARVEST OF GREATER SAPHENOUS VEIN (Right)  SURGEON:  Surgeon(s) and Role:    Rexene Alberts, MD - Primary  PHYSICIAN ASSISTANT:  Nicholes Rough, PA-C   ANESTHESIA:   general  EBL: tbd  BLOOD ADMINISTERED:none  DRAINS: ROUTINE   LOCAL MEDICATIONS USED:  NONE  SPECIMEN:  Source of Specimen:  AORTIC VALVE LEAFLETS AND MITRAL VALVE LEAFLETS  DISPOSITION OF SPECIMEN:  PATHOLOGY  COUNTS:  YES  TOURNIQUET:  * No tourniquets in log *  DICTATION: .Dragon Dictation  PLAN OF CARE: Admit to inpatient   PATIENT DISPOSITION:  ICU - intubated and hemodynamically stable.   Delay start of Pharmacological VTE agent (>24hrs) due to surgical blood loss or risk of bleeding: yes

## 2017-11-16 NOTE — Anesthesia Postprocedure Evaluation (Signed)
Anesthesia Post Note  Patient: Brandon [name]  Procedure(s) Performed: AORTIC VALVE REPLACEMENT (AVR) with 70mm Magna Ease (N/A Chest) MITRAL VALVE  REPLACEMENT with 94mm MagnaEase (N/A Chest) CORONARY ARTERY BYPASS GRAFTING (CABG) x 1 (N/A Chest) MAZE (N/A Chest) TRANSESOPHAGEAL ECHOCARDIOGRAM (TEE) (N/A ) ARTERIAL LINE INSERTION -RIGHT FEMORAL (Right Groin) ENDOVEIN HARVEST OF GREATER SAPHENOUS VEIN (Right Leg Upper)     Patient location during evaluation: SICU Anesthesia Type: General Level of consciousness: sedated Pain management: pain level controlled Vital Signs Assessment: post-procedure vital signs reviewed and stable Respiratory status: patient remains intubated per anesthesia plan Cardiovascular status: stable Postop Assessment: no apparent nausea or vomiting Anesthetic complications: no    Last Vitals:  Vitals:   11/16/17 1730 11/16/17 1745  BP:    Pulse:    Resp: 17 15  Temp: (!) 36.2 C (!) 36.2 C  SpO2: 98% 98%    Last Pain:  Vitals:   11/16/17 1700  TempSrc: Core (Comment)                 Lillyanne Bradburn DAVID

## 2017-11-16 NOTE — Op Note (Addendum)
CARDIOTHORACIC SURGERY OPERATIVE NOTE  Date of Procedure:  11/16/2017  Preoperative Diagnosis:   Moderate Aortic Stenosis and Aortic Regurgitation  Severe Mitral Regurgitation  Severe Single Vessel Coronary Artery Disease  Recurrent Paroxysmal Atrial Fibrillation  Postoperative Diagnosis: Same  Procedure:   Aortic Valve Replacement   Edwards Magna Ease Bovine Bioprosthetic Tissue Valve (size 23 mm, model #3300TFX, serial #[id number])   Mitral Valve Replacement   Edwards Magna Mitral Bovine Bioprosthetic Tissue Valve (size 27 mm, model #7300TFX, serial #[id number])   Coronary Artery Bypass Grafting x 1  Saphenous Vein Graft to Posterior Descending Coronary Artery  Endoscopic Vein Harvest from Right Thigh   Maze Procedure   Left atrial lesion set using bipolar radiofrequency and cryothermy ablation  clipping of left atrial appendage (Atriclip size 59mm)    Surgeon: Valentina Gu. Roxy Manns, MD  Assistant: Nicholes Rough, PA-C  Anesthesia: Lillia Abed, MD  Operative Findings:  Rheumatic aortic and mitral valve disease  Moderate-severe aortic stenosis  Moderate aortic Insufficiency  Type IIIA mitral valve dysfunction with severe mitral regurgitation  Dilated left ventricle with normal systolic function  Moderate LV hypertrophy  Normal RV size and systolic function  Severe atherosclerosis with patchy calcification of ascending thoracic aorta                     BRIEF CLINICAL NOTE AND INDICATIONS FOR SURGERY  Patient is a 71 year old moderately obese white male with history of cerebrovascular disease status post right hemispheric stroke in 2004, hypertension, aortic stenosis with aortic insufficiency, mitral regurgitation, and recurrent paroxysmal atrial fibrillation who has been referred for surgical consultation to discuss treatment options for management of his valvular heart disease and atrial fibrillation. The patient suffered a stroke in 2004 involving  the right middle cerebral artery that was treated with catheter directed thrombo-lysis with remarkable success. He initially presented with severe left-sided paralysis, but he recovered from the stroke quite well with some mild residual problems with short-term memory loss. At the time he was told that there were signs to suggest that he may have had a heart attack in the past and he was referred for cardiology consultation. He has been followed intermittently ever since by Dr. Harrington Challenger. He was noted to have a heart murmur on exam and echocardiograms revealed the presence of aortic stenosis with aortic insufficiency, mitral regurgitation, and mild left ventricular systolic dysfunction. He had remained clinically stable until last spring when he was scheduled for left shoulder surgery. Preoperative cardiac clearance was requested and both transthoracic and transesophageal echocardiograms were performed. Echocardiograms revealed normal left ventricular systolic function with mild to moderate aortic stenosis, moderate aortic insufficiency, and moderate mitral regurgitation. The patient underwent uncomplicated left shoulder reconstruction. During his immediate postoperative recovery the patient states that he experienced a 30 minute period of tachypalpitations associated with severe shortness of breath and chest discomfort. Symptoms resolved and did not recur. Over the next 2 months the patient did fairly well and went to physical rehabilitation for treatment of his shoulder surgery. During that period of time he was noted to experience exertional shortness of breath, and he was admitted acutely in August with severe resting shortness breath and chest pressure associated with atrial fibrillation and rapid ventricular response. He developed acute hypoxemic respiratory failure requiring intubation for mechanical ventilatory support. He became hypotensive and required pressors for a period of time. He developed acute renal  failure and pneumonia. He had a prolonged hospitalization but eventually recovered and was extubated. Because of his severe  deconditioning he was discharged to the inpatient rehabilitation service for 2 weeks. He continues to gradually improve and was seen in follow-up by Dr. Aundra Dubin in the advanced heart failure clinic. He has been maintaining sinus rhythm on amiodarone and is anticoagulated using Eliquis.Transesophageal echocardiogram and diagnostic cardiac catheterization were performed 08/30/2017. TEE revealed mildtomoderate aortic stenosis with moderate to severe aortic insufficiency and severe mitral regurgitation. There was mild left ventricular chamber enlargement with ejection fraction estimated 50%. Diagnostic cardiac catheterization revealed a normal as coronary circulation with chronic occlusion of the right coronary artery and posterior descending coronary artery with left-to-right collaterals. There was no significant disease in the left coronary system. Mean transvalvular gradient across the aortic valve was 23 mmHg. Pulmonary artery pressures were very mildly elevated. Patient was referred for surgical consultation.  The patient has been seen in consultation and counseled at length regarding the indications, risks and potential benefits of surgery.  All questions have been answered, and the patient provides full informed consent for the operation as described.       DETAILS OF THE OPERATIVE PROCEDURE  Preparation:  The patient is brought to the operating room on the above mentioned date and central monitoring was established by the anesthesia team including placement of Swan-Ganz catheter and radial arterial line. The patient is placed in the supine position on the operating table.  Intravenous antibiotics are administered. General endotracheal anesthesia is induced uneventfully. A Foley catheter is placed.  Baseline transesophageal echocardiogram was performed.  Findings were  notable for severe aortic stenosis with moderate aortic insufficiency and severe mitral regurgitation.  The aortic valve is trileaflet.  There was moderate thickening of the aortic valve leaflets.  There was severe central mitral regurgitation.  The posterior leaflet was severely restricted.  There was calcification in the posterior annulus.  The left ventricle was dilated.  There was normal left ventricular systolic function.  There was moderate left ventricular hypertrophy.  Right ventricular size and function was normal.  The patient's chest, abdomen, both groins, and both lower extremities are prepared and draped in a sterile manner. A time out procedure is performed.   Surgical Approach and Conduit Harvest:  The greater saphenous vein is obtained from the patient's right thigh using endoscopic vein harvest technique. The saphenous vein is notably slightly small in caliber but otherwise good quality conduit. After removal of the saphenous vein, the small surgical incisions in the lower extremity are closed with absorbable suture.   A median sternotomy incision was performed.  The pericardium is opened.  The ascending aorta is severely diseased with atherosclerosis with areas of patchy calcification.  Calcification was most prominent in the aortic root.   Extracorporeal Cardiopulmonary Bypass and Myocardial Protection:  The right common femoral artery is cannulated using a Seldinger technique and an 18-gauge catheter advanced into the femoral artery for monitoring purposes.  The right common femoral vein is cannulated using the Seldinger technique and a guidewire advanced into the right atrium using TEE guidance.  The patient is heparinized systemically and the femoral vein cannulated using a 22 Fr long femoral venous cannula.  The ascending aorta is cannulated for cardiopulmonary bypass.  Adequate heparinization is verified.   A retrograde cardioplegia cannula is placed through the right atrium into  the coronary sinus.   The entire pre-bypass portion of the operation was notable for stable hemodynamics.  Cardiopulmonary bypass was begun and the surface of the heart is inspected.  A second venous cannula is placed directly into the superior  vena cava.   A cardioplegia cannula is placed in the ascending aorta.  A temperature probe was placed in the interventricular septum.  The patient is cooled to 32C systemic temperature.  The aortic cross clamp is applied and cardioplegia is delivered initially in an antegrade fashion through the aortic root using modified [place] cold blood cardioplegia (Kennestone blood cardioplegia protocol).   The initial cardioplegic arrest is rapid with early diastolic arrest.  Repeat doses of cardioplegia are administered at 90 minutes and every 30 minutes thereafter through the subsequently placed vein graft and through the coronary sinus catheter in order to maintain completely flat electrocardiogram.  Myocardial protection was felt to be excellent.   Coronary Artery Bypass Grafting:  The  posterior descending branch of the right coronary artery was grafted using a reversed saphenous vein graft in an end-to-side fashion.  At the site of distal anastomosis the target vessel was good quality and measured approximately 1.7 mm in diameter.   Maze Procedure (left atrial lesion set):  The AtriCure Synergy bipolar radiofrequency ablation clamp is used for all radiofrequency ablation lesions for the maze procedure.  The Atricure CryoICE nitrous oxide cryothermy system is utilized for all cryothermy ablation lesions.   The heart is retracted towards the surgeon's side and the left sided pulmonary veins exposed.  An elliptical ablation lesion is created around the base of the left sided pulmonary veins.  A similar elliptical lesion was created around the base of the left atrial appendage.  The left atrial appendage was obliterated using an Atricure left atrial appendage  clip (Atriclip, size 40 mm).  The heart was replaced into the pericardial sac.  A left atriotomy incision was performed through the interatrial groove and extended partially across the back wall of the left atrium after opening the oblique sinus inferiorly.  The floor of the left atrium and the mitral valve were exposed using a self-retaining retractor.  The mitral valve was inspected.  An ablation lesion was placed around the right sided pulmonary veins using the bipolar clamp with one limb of the clamp along the endocardial surface and one along the epicardial surface posteriorly.  A bipolar ablation lesion was placed across the dome of the left atrium from the cephalad apex of the atriotomy incision to reach the cephalad apex of the elliptical lesion around the left sided pulmonary veins.  A similar bipolar lesion was placed across the back wall of the left atrium from the caudad apex of the atriotomy incision to reach the caudad apex of the elliptical lesion around the left sided pulmonary veins, thereby completing a box.  Finally another bipolar lesion was placed across the back wall of the left atrium from the caudad apex of the atriotomy incision towards the posterior mitral valve annulus.  This lesion was completed along the endocardial surface onto the posterior mitral annulus with a 3 minute duration cryothermy lesion, followed by a second cryothermy lesion along the posterior epicardial surface of the left atrium across the coronary sinus.   This completes the entire left side lesion set of the Cox maze procedure.   Mitral Valve Replacement:  The mitral valve was inspected and notable for classical rheumatic features with chordal fusion and foreshortening involving the majority of the primary chordae tendinae to the P2 segment of the posterior leaflet.  Exposure of the mitral valve was somewhat challenging due to inability to retract the aortic root satisfactorily.    A low transverse aortotomy  incision is performed.  The  aortic wall is notably severely diseased with patchy calcified plaque.  The aortic valve was inspected.  The aortic valve is trileaflet with classical rheumatic features.  The aortic leaflets are excised sharply.  There is minimal annular calcification.  Attention is redirected back to the mitral valve and exposure is notably considerably improved.  An attempt at valve repair is felt potentially feasible.  Initially the severely thickened and retracted primary chordae tendon need to the P2 segment of the posterior leaflet are debulked and divided.  2-0 Ethibond horizontal mattress sutures are placed circumferentially around the mitral annulus.  With the valve pulled up into symmetrical orientation on further inspection there appears to be additional severe restriction of the posterior leaflet.  Further attempt to repair the valve was subsequently aborted.  The anterior leaflet of the mitral valve was excised sharply, leaving a small rim of the free margin and the associated primary chords.  The posterior leaflet split in the midline and debulked.  The mitral annulus was sized to accept a 27 mm prosthesis.  The left ventricle was irrigated with copious cold saline solution.  Mitral valve replacement was performed using interrupted horizontal mattress 2-0 Ethibond pledgeted sutures with pledgets in the supraannular position.  The remaining portions of the anterior leaflet were incorporated into the suture line laterally, thereby preserving chords to both the anterior and posterior leaflet.  An [facility] Mitral bovine bioprosthetic tissue valve (size 27 mm, model # 7300TFX, serial # [id number]) was implanted uneventfully. All sutures were secured using a Cor-knot device.  The valve seated appropriately with care to position the commissure posts away from the left ventricular outflow tract.    The atriotomy was closed using a 2-layer closure of running 3-0 Prolene suture after  placing a sump drain across the mitral valve to serve as a left ventricular vent.     Aortic Valve Replacement:  The aortic annulus was sized to accept a 23 mm prosthesis.  The aortic root and left ventricle were irrigated with copious cold saline solution.  Aortic valve replacement was performed using interrupted horizontal mattress 2-0 Ethibond pledgeted sutures with pledgets in the subannular position.  An [facility] Ease pericardial tissue valve (size 23 mm, model # 3300TFX, serial # [id number]) was implanted uneventfully. The valve seated appropriately with adequate space beneath the left main and right coronary artery.  All sutures were secured using a Cor-knot device.  The aortotomy was closed using a 2-layer closure of running 3-0 Prolene suture with Teflon felt strips to buttress the suture line.     Procedure Completion:  Single proximal saphenous vein anastomosis is performed directly to the ascending aorta prior to removal of the aortic cross-clamp.  One final dose of warm retrograde "reanimation dose" cardioplegia was administered retrograde through the coronary sinus catheter while all air was evacuated through the aortic root.  The aortic cross clamp was removed after a total cross clamp time of 219 minutes.  Epicardial pacing wires are fixed to the right ventricular outflow tract and to the right atrial appendage. The patient is rewarmed to 37C temperature. The left ventricular vent is removed.  The patient is weaned and disconnected from cardiopulmonary bypass.  The patient's rhythm at separation from bypass was AV paced.  The patient was weaned from cardioplegic bypass on low dose milrinone and dopamine infusionst. Total cardiopulmonary bypass time for the operation was 247 minutes.  Followup transesophageal echocardiogram performed after separation from bypass revealed well-seated aortic and mitral valve prosthetic valves that  were functioning normally and without any sign of  perivalvular leak.  Left ventricular function was unchanged from preoperatively.  The mean transvalvular gradient across the aortic valve was estimated 8 mmHg.  Mean transvalvular gradient across the mitral valve was estimated 3 mmHg.  The aortic and superior vena cava cannula were removed uneventfully. Protamine was administered to reverse the anticoagulation. The femoral venous cannula was removed and manual pressure held on the groin for 30 minutes.  The mediastinum and pleural spaces were inspected for hemostasis and irrigated with saline solution. The mediastinum and both pleural space were drained using 4 chest tubes placed through separate stab incisions inferiorly.  The soft tissues anterior to the aorta were reapproximated loosely. The sternum is closed with double strength sternal wire. The soft tissues anterior to the sternum were closed in multiple layers and the skin is closed with a running subcuticular skin closure.   The post-bypass portion of the operation was notable for stable rhythm and hemodynamics.   No blood products were administered during the operation.   Patient Disposition:  The patient tolerated the procedure well and is transported to the surgical intensive care in stable condition. There are no intraoperative complications. All sponge instrument and needle counts are verified correct at completion of the operation.     Valentina Gu. Roxy Manns MD 11/16/2017 3:10 PM

## 2017-11-16 NOTE — Progress Notes (Signed)
Upon arrival to CVICU pt noted to have a sat of 83%.  Pt placed on the vent and given 100% O2 without improvement.  Recruitment maneuver performed with PC25, PEEP +5.  Pt sat responded well and he increased to 100%.  BP transiently feel to SBP in the low 90s.  Pt returned to normal settings and this evened out.  Approximately 45 minutes later pt noted with sat of 93%.  Recruitment again performed with pt on same previous settings.  Blood pressure had same transient response with sat increasing to 100% again.  Dr. Ricard Dillon at bedside for this and updated on previous recruitment.  He stated to intermittently recruit pt PRN.  VSS at this time.

## 2017-11-16 NOTE — Anesthesia Procedure Notes (Signed)
Central Venous Catheter Insertion Performed by: Myrtie Soman, MD, anesthesiologist Start/End12/05/2017 6:52 AM, 11/16/2017 7:14 AM Patient location: Pre-op. Preanesthetic checklist: patient identified, IV checked, site marked, risks and benefits discussed, surgical consent, monitors and equipment checked, pre-op evaluation, timeout performed and anesthesia consent Position: Trendelenburg Lidocaine 1% used for infiltration Hand hygiene performed  and maximum sterile barriers used  Catheter size: 9 Fr PA cath was placed.Swan type:thermodilution PA Cath depth:44 Procedure performed without using ultrasound guided technique. Ultrasound Notes:anatomy identified, needle tip was noted to be adjacent to the nerve/plexus identified, no ultrasound evidence of intravascular and/or intraneural injection and image(s) printed for medical record Attempts: 1 Following insertion, line sutured, dressing applied and Biopatch. Post procedure assessment: blood return through all ports and free fluid flow  Patient tolerated the procedure well with no immediate complications.

## 2017-11-16 NOTE — Progress Notes (Signed)
  Echocardiogram 2D Echocardiogram has been performed.  Shanele Nissan G Jamien Casanova 11/16/2017, 9:06 AM

## 2017-11-16 NOTE — Anesthesia Procedure Notes (Signed)
Arterial Line Insertion Start/End12/05/2017 7:00 AM, 11/16/2017 7:03 AM Performed by: Inda Coke, CRNA, CRNA  Patient location: Pre-op. Preanesthetic checklist: patient identified, IV checked, site marked, risks and benefits discussed, surgical consent, monitors and equipment checked, pre-op evaluation, timeout performed and anesthesia consent Lidocaine 1% used for infiltration and patient sedated Left, radial was placed Catheter size: 20 G Hand hygiene performed  and maximum sterile barriers used  Allen's test indicative of satisfactory collateral circulation Attempts: 1 Procedure performed without using ultrasound guided technique. Ultrasound Notes:anatomy identified Following insertion, Biopatch and dressing applied. Post procedure assessment: normal  Patient tolerated the procedure well with no immediate complications.

## 2017-11-16 NOTE — Progress Notes (Signed)
RT attempted rapid wean. Pt did well on breathing trial, but could not perform NIF and VC. RT will attempt to wean again at a later time.

## 2017-11-16 NOTE — Anesthesia Preprocedure Evaluation (Signed)
Anesthesia Evaluation  Patient identified by MRN, date of birth, ID band Patient awake    Reviewed: Allergy & Precautions, NPO status , Patient's Chart, lab work & pertinent test results  Airway Mallampati: I  TM Distance: >3 FB Neck ROM: Full    Dental   Pulmonary sleep apnea , COPD, former smoker,    Pulmonary exam normal        Cardiovascular hypertension, Pt. on medications + CAD  Normal cardiovascular exam+ Valvular Problems/Murmurs AS, AI and MR      Neuro/Psych Anxiety Depression CVA    GI/Hepatic GERD  Controlled,  Endo/Other    Renal/GU Renal InsufficiencyRenal disease     Musculoskeletal   Abdominal   Peds  Hematology   Anesthesia Other Findings   Reproductive/Obstetrics                             Anesthesia Physical Anesthesia Plan  ASA: IV  Anesthesia Plan: General   Post-op Pain Management:    Induction: Intravenous  PONV Risk Score and Plan: 2 and Treatment may vary due to age or medical condition  Airway Management Planned: Oral ETT  Additional Equipment: Arterial line, CVP, PA Cath, TEE, 3D TEE and Ultrasound Guidance Line Placement  Intra-op Plan:   Post-operative Plan: Post-operative intubation/ventilation  Informed Consent: I have reviewed the patients History and Physical, chart, labs and discussed the procedure including the risks, benefits and alternatives for the proposed anesthesia with the patient or authorized representative who has indicated his/her understanding and acceptance.     Plan Discussed with: CRNA and Surgeon  Anesthesia Plan Comments:         Anesthesia Quick Evaluation

## 2017-11-16 NOTE — Transfer of Care (Signed)
Immediate Anesthesia Transfer of Care Note  Patient: Brandon [name]  Procedure(s) Performed: AORTIC VALVE REPLACEMENT (AVR) with 76mm Magna Ease (N/A Chest) MITRAL VALVE  REPLACEMENT with 65mm MagnaEase (N/A Chest) CORONARY ARTERY BYPASS GRAFTING (CABG) x 1 (N/A Chest) MAZE (N/A Chest) TRANSESOPHAGEAL ECHOCARDIOGRAM (TEE) (N/A ) ARTERIAL LINE INSERTION -RIGHT FEMORAL (Right Groin) ENDOVEIN HARVEST OF GREATER SAPHENOUS VEIN (Right Leg Upper)  Patient Location: SICU  Anesthesia Type:General  Level of Consciousness: Patient remains intubated per anesthesia plan  Airway & Oxygen Therapy: Patient remains intubated per anesthesia plan and Patient placed on Ventilator (see vital sign flow sheet for setting)  Post-op Assessment: Report given to RN and Post -op Vital signs reviewed and stable  Post vital signs: Reviewed and stable  Last Vitals:  Vitals:   11/16/17 0601 11/16/17 0632  BP: (!) 161/53 (!) 140/52  Pulse: (!) 57 (!) 59  Resp: 18   Temp: 36.6 C   SpO2: 98%     Last Pain:  Vitals:   11/16/17 0601  TempSrc: Oral      Patients Stated Pain Goal: 3 (73/41/93 7902)  Complications: No apparent anesthesia complications

## 2017-11-16 NOTE — Anesthesia Procedure Notes (Signed)
Anesthesia Procedure Image    

## 2017-11-17 ENCOUNTER — Encounter (HOSPITAL_COMMUNITY): Payer: Self-pay | Admitting: Thoracic Surgery (Cardiothoracic Vascular Surgery)

## 2017-11-17 ENCOUNTER — Inpatient Hospital Stay (HOSPITAL_COMMUNITY): Payer: Medicare HMO

## 2017-11-17 LAB — GLUCOSE, CAPILLARY
GLUCOSE-CAPILLARY: 128 mg/dL — AB (ref 65–99)
GLUCOSE-CAPILLARY: 114 mg/dL — AB (ref 65–99)
GLUCOSE-CAPILLARY: 125 mg/dL — AB (ref 65–99)
GLUCOSE-CAPILLARY: 109 mg/dL — AB (ref 65–99)
GLUCOSE-CAPILLARY: 88 mg/dL (ref 65–99)
GLUCOSE-CAPILLARY: 129 mg/dL — AB (ref 65–99)
GLUCOSE-CAPILLARY: 121 mg/dL — AB (ref 65–99)
GLUCOSE-CAPILLARY: 130 mg/dL — AB (ref 65–99)
Glucose-Capillary: 169 mg/dL — ABNORMAL HIGH (ref 65–99)
Glucose-Capillary: 145 mg/dL — ABNORMAL HIGH (ref 65–99)
Glucose-Capillary: 135 mg/dL — ABNORMAL HIGH (ref 65–99)
Glucose-Capillary: 114 mg/dL — ABNORMAL HIGH (ref 65–99)
Glucose-Capillary: 106 mg/dL — ABNORMAL HIGH (ref 65–99)
Glucose-Capillary: 120 mg/dL — ABNORMAL HIGH (ref 65–99)

## 2017-11-17 LAB — POCT I-STAT 3, ART BLOOD GAS (G3+)
Acid-base deficit: 1 mmol/L (ref 0.0–2.0)
Acid-base deficit: 5 mmol/L — ABNORMAL HIGH (ref 0.0–2.0)
Bicarbonate: 24.4 mmol/L (ref 20.0–28.0)
Bicarbonate: 20.5 mmol/L (ref 20.0–28.0)
O2 SAT: 99 %
O2 Saturation: 98 %
PCO2 ART: 41.1 mmHg (ref 32.0–48.0)
PCO2 ART: 40.8 mmHg (ref 32.0–48.0)
PH ART: 7.381 (ref 7.350–7.450)
PH ART: 7.308 — AB (ref 7.350–7.450)
PO2 ART: 134 mmHg — AB (ref 83.0–108.0)
Patient temperature: 36.9
TCO2: 26 mmol/L (ref 22–32)
TCO2: 22 mmol/L (ref 22–32)
pO2, Arterial: 116 mmHg — ABNORMAL HIGH (ref 83.0–108.0)

## 2017-11-17 LAB — POCT I-STAT, CHEM 8
BUN: 22 mg/dL — ABNORMAL HIGH (ref 6–20)
CALCIUM ION: 1.2 mmol/L (ref 1.15–1.40)
Chloride: 109 mmol/L (ref 101–111)
Creatinine, Ser: 1.2 mg/dL (ref 0.61–1.24)
GLUCOSE: 132 mg/dL — AB (ref 65–99)
HCT: 25 % — ABNORMAL LOW (ref 39.0–52.0)
Hemoglobin: 8.5 g/dL — ABNORMAL LOW (ref 13.0–17.0)
Potassium: 4.2 mmol/L (ref 3.5–5.1)
Sodium: 142 mmol/L (ref 135–145)
TCO2: 22 mmol/L (ref 22–32)

## 2017-11-17 LAB — CBC
HCT: 26 % — ABNORMAL LOW (ref 39.0–52.0)
HCT: 25.7 % — ABNORMAL LOW (ref 39.0–52.0)
HEMOGLOBIN: 8.5 g/dL — AB (ref 13.0–17.0)
Hemoglobin: 8.4 g/dL — ABNORMAL LOW (ref 13.0–17.0)
MCH: 31.5 pg (ref 26.0–34.0)
MCH: 32.1 pg (ref 26.0–34.0)
MCHC: 32.7 g/dL (ref 30.0–36.0)
MCHC: 32.7 g/dL (ref 30.0–36.0)
MCV: 96.3 fL (ref 78.0–100.0)
MCV: 98.1 fL (ref 78.0–100.0)
PLATELETS: 119 10*3/uL — AB (ref 150–400)
PLATELETS: 114 10*3/uL — AB (ref 150–400)
RBC: 2.7 MIL/uL — ABNORMAL LOW (ref 4.22–5.81)
RBC: 2.62 MIL/uL — ABNORMAL LOW (ref 4.22–5.81)
RDW: 14.1 % (ref 11.5–15.5)
RDW: 14.8 % (ref 11.5–15.5)
WBC: 15.3 10*3/uL — ABNORMAL HIGH (ref 4.0–10.5)
WBC: 14.5 10*3/uL — AB (ref 4.0–10.5)

## 2017-11-17 LAB — BASIC METABOLIC PANEL
Anion gap: 5 (ref 5–15)
BUN: 19 mg/dL (ref 6–20)
CHLORIDE: 113 mmol/L — AB (ref 101–111)
CO2: 22 mmol/L (ref 22–32)
CREATININE: 1.3 mg/dL — AB (ref 0.61–1.24)
Calcium: 8 mg/dL — ABNORMAL LOW (ref 8.9–10.3)
GFR calc Af Amer: >60 mL/min (ref >60)
GFR calc non Af Amer: 54 mL/min — ABNORMAL LOW (ref >60)
Glucose, Bld: 117 mg/dL — ABNORMAL HIGH (ref 65–99)
Potassium: 4 mmol/L (ref 3.5–5.1)
SODIUM: 140 mmol/L (ref 135–145)

## 2017-11-17 LAB — MAGNESIUM
MAGNESIUM: 2.9 mg/dL — AB (ref 1.7–2.4)
MAGNESIUM: 2.8 mg/dL — AB (ref 1.7–2.4)

## 2017-11-17 LAB — CREATININE, SERUM
Creatinine, Ser: 1.34 mg/dL — ABNORMAL HIGH (ref 0.61–1.24)
GFR calc non Af Amer: 52 mL/min — ABNORMAL LOW (ref >60)
GFR, EST AFRICAN AMERICAN: 60 mL/min — AB (ref >60)

## 2017-11-17 MED ORDER — WARFARIN SODIUM 2.5 MG PO TABS
2.5000 mg | ORAL_TABLET | Freq: Every day | ORAL | Status: DC
  Administered 2017-11-17 – 2017-11-18 (×2): 2.5 mg via ORAL
  Filled 2017-11-17 (×2): qty 1

## 2017-11-17 MED ORDER — INSULIN DETEMIR 100 UNIT/ML ~~LOC~~ SOLN
20.0000 [IU] | Freq: Two times a day (BID) | SUBCUTANEOUS | Status: DC
  Administered 2017-11-17: 20 [IU] via SUBCUTANEOUS
  Filled 2017-11-17 (×3): qty 0.2

## 2017-11-17 MED ORDER — LORAZEPAM 0.5 MG PO TABS: 0.5000 mg | ORAL_TABLET | Freq: Four times a day (QID) | ORAL | Status: DC | PRN

## 2017-11-17 MED ORDER — INSULIN DETEMIR 100 UNIT/ML ~~LOC~~ SOLN
20.0000 [IU] | Freq: Every day | SUBCUTANEOUS | Status: DC
  Administered 2017-11-18 – 2017-11-20 (×3): 20 [IU] via SUBCUTANEOUS
  Filled 2017-11-17 (×4): qty 0.2

## 2017-11-17 MED ORDER — INSULIN ASPART 100 UNIT/ML ~~LOC~~ SOLN: 0.0000 [IU] | SUBCUTANEOUS | Status: DC

## 2017-11-17 MED ORDER — INSULIN ASPART 100 UNIT/ML ~~LOC~~ SOLN
0.0000 [IU] | SUBCUTANEOUS | Status: DC
  Administered 2017-11-17 – 2017-11-19 (×8): 2 [IU] via SUBCUTANEOUS

## 2017-11-17 MED ORDER — CITALOPRAM HYDROBROMIDE 20 MG PO TABS
20.0000 mg | ORAL_TABLET | Freq: Every day | ORAL | Status: DC
  Administered 2017-11-17 – 2017-11-23 (×7): 20 mg via ORAL
  Filled 2017-11-17 (×7): qty 1

## 2017-11-17 MED ORDER — WARFARIN - PHYSICIAN DOSING INPATIENT
Freq: Every day | Status: DC
  Administered 2017-11-18: 1

## 2017-11-17 MED ORDER — FUROSEMIDE 10 MG/ML IJ SOLN
10.0000 mg/h | INTRAVENOUS | Status: AC
  Administered 2017-11-17 – 2017-11-22 (×6): 10 mg/h via INTRAVENOUS
  Filled 2017-11-17 (×10): qty 25

## 2017-11-17 MED ORDER — ORAL CARE MOUTH RINSE
15.0000 mL | Freq: Two times a day (BID) | OROMUCOSAL | Status: DC
  Administered 2017-11-17 – 2017-11-21 (×4): 15 mL via OROMUCOSAL

## 2017-11-17 MED ORDER — TRAZODONE HCL 50 MG PO TABS
50.0000 mg | ORAL_TABLET | Freq: Every day | ORAL | Status: DC
  Administered 2017-11-17 – 2017-11-23 (×7): 50 mg via ORAL
  Filled 2017-11-17 (×7): qty 1

## 2017-11-17 MED ORDER — AMIODARONE HCL 200 MG PO TABS
200.0000 mg | ORAL_TABLET | Freq: Every day | ORAL | Status: DC
  Administered 2017-11-18 – 2017-11-19 (×2): 200 mg via ORAL
  Filled 2017-11-17 (×3): qty 1

## 2017-11-17 MED ORDER — ENOXAPARIN SODIUM 30 MG/0.3ML ~~LOC~~ SOLN
30.0000 mg | SUBCUTANEOUS | Status: DC
  Administered 2017-11-18 – 2017-11-20 (×3): 30 mg via SUBCUTANEOUS
  Filled 2017-11-17 (×3): qty 0.3

## 2017-11-17 MED ORDER — CHLORHEXIDINE GLUCONATE 0.12 % MT SOLN
15.0000 mL | Freq: Two times a day (BID) | OROMUCOSAL | Status: DC
  Administered 2017-11-17 – 2017-11-23 (×13): 15 mL via OROMUCOSAL
  Filled 2017-11-17 (×12): qty 15

## 2017-11-17 MED FILL — Heparin Sodium (Porcine) Inj 1000 Unit/ML: INTRAMUSCULAR | Qty: 30 | Status: AC

## 2017-11-17 MED FILL — Potassium Chloride Inj 2 mEq/ML: INTRAVENOUS | Qty: 20 | Status: AC

## 2017-11-17 MED FILL — Magnesium Sulfate Inj 50%: INTRAMUSCULAR | Qty: 10 | Status: AC

## 2017-11-17 MED FILL — Electrolyte-R (PH 7.4) Solution: INTRAVENOUS | Qty: 4000 | Status: AC

## 2017-11-17 MED FILL — Mannitol IV Soln 20%: INTRAVENOUS | Qty: 1000 | Status: AC

## 2017-11-17 MED FILL — Lidocaine HCl IV Inj 20 MG/ML: INTRAVENOUS | Qty: 15 | Status: AC

## 2017-11-17 NOTE — Progress Notes (Signed)
PT Cancellation Note  Patient Details Name: Brandon [name] MRN: [id number] DOB: 1946/09/23   Cancelled Treatment:    Reason Eval/Treat Not Completed: Medical issues which prohibited therapy(pt with mediastinal tube in place and already in chair with nursing. Will plan to evaluate next date)   Latika Kronick B Makenzie Vittorio 11/17/2017, 12:52 PM  Elwyn Reach, [place]

## 2017-11-17 NOTE — Plan of Care (Signed)
  Progressing Activity: Risk for activity intolerance will decrease 11/17/2017 2214 - Progressing by Rolan Lipa, RN Note Pt got up to chair today for 3 hours Cardiac: Hemodynamic stability will improve 11/17/2017 2214 - Progressing by Rolan Lipa, RN Note Pt off levo and neo drips

## 2017-11-17 NOTE — Progress Notes (Signed)
Patient ID: Brandon [name], male   DOB: [birth year], 71 y.o.   MRN: [id number] T[facility] afternoon rounds:  Hemodynamically stable on low dose levophed and dop 3.  DDD paced at 85  sats 98%  Urine output ok  CT output low  Was up in chair for 3 hrs today.

## 2017-11-17 NOTE — Discharge Instructions (Addendum)
Supplemental Discharge Instructions for  Pacemaker/Defibrillator Patients  Activity No heavy lifting or vigorous activity with your left/right arm for 6 to 8 weeks.  Do not raise your left/right arm above your head for one week.  Gradually raise your affected arm as drawn below.           __        11/26/17                 11/27/17               11/28/17                   11/29/17  NO DRIVING   WOUND CARE - Keep the wound area clean and dry.  Do not get this area wet for one week. No showers for one week; you may shower on 11/29/17    . - The tape/steri-strips on your wound will fall off; do not pull them off.  No bandage is needed on the site.  DO  NOT apply any creams, oils, or ointments to the wound area. - If you notice any drainage or discharge from the wound, any swelling or bruising at the site, or you develop a fever > 101? F after you are discharged home, call the office at once.  Special Instructions - You are still able to use cellular telephones; use the ear opposite the side where you have your pacemaker/defibrillator.  Avoid carrying your cellular phone near your device. - When traveling through airports, show security personnel your identification card to avoid being screened in the metal detectors.  Ask the security personnel to use the hand wand. - Avoid arc welding equipment, TENS units (transcutaneous nerve stimulators).  Call the office for questions about other devices. - Avoid electrical appliances that are in poor condition or are not properly grounded. - Microwave ovens are safe to be near or to operate.     Information on my medicine - Coumadin   (Warfarin)  This medication education was reviewed with me or my healthcare representative as part of my discharge preparation.  The pharmacist that spoke with me during my hospital stay was:  Dareen Piano, [facility]  Why was Coumadin prescribed for you? Coumadin was prescribed for you because you have a blood  clot or a medical condition that can cause an increased risk of forming blood clots. Blood clots can cause serious health problems by blocking the flow of blood to the heart, lung, or brain. Coumadin can prevent harmful blood clots from forming. As a reminder your indication for Coumadin is:   Blood Clot Prevention After Heart Valve Surgery  What test will check on my response to Coumadin? While on Coumadin (warfarin) you will need to have an INR test regularly to ensure that your dose is keeping you in the desired range. The INR (international normalized ratio) number is calculated from the result of the laboratory test called prothrombin time (PT).  If an INR APPOINTMENT HAS NOT ALREADY BEEN MADE FOR YOU please schedule an appointment to have this lab work done by your health care provider within 7 days. Your INR goal is usually a number between:  2 to 3 or your provider may give you a more narrow range like 2-2.5.  Ask your health care provider during an office visit what your goal INR is.  What  do you need to  know  About  COUMADIN? Take Coumadin (warfarin) exactly as  prescribed by your healthcare provider about the same time each day.  DO NOT stop taking without talking to the doctor who prescribed the medication.  Stopping without other blood clot prevention medication to take the place of Coumadin may increase your risk of developing a new clot or stroke.  Get refills before you run out.  What do you do if you miss a dose? If you miss a dose, take it as soon as you remember on the same day then continue your regularly scheduled regimen the next day.  Do not take two doses of Coumadin at the same time.  Important Safety Information A possible side effect of Coumadin (Warfarin) is an increased risk of bleeding. You should call your healthcare provider right away if you experience any of the following: ? Bleeding from an injury or your nose that does not stop. ? Unusual colored urine (red or  dark brown) or unusual colored stools (red or black). ? Unusual bruising for unknown reasons. ? A serious fall or if you hit your head (even if there is no bleeding).  Some foods or medicines interact with Coumadin (warfarin) and might alter your response to warfarin. To help avoid this: ? Eat a balanced diet, maintaining a consistent amount of Vitamin K. ? Notify your provider about major diet changes you plan to make. ? Avoid alcohol or limit your intake to 1 drink for women and 2 drinks for men per day. (1 drink is 5 oz. wine, 12 oz. beer, or 1.5 oz. liquor.)  Make sure that ANY health care provider who prescribes medication for you knows that you are taking Coumadin (warfarin).  Also make sure the healthcare provider who is monitoring your Coumadin knows when you have started a new medication including herbals and non-prescription products.  Coumadin (Warfarin)  Major Drug Interactions  Increased Warfarin Effect Decreased Warfarin Effect  Alcohol (large quantities) Antibiotics (esp. Septra/Bactrim, Flagyl, Cipro) Amiodarone (Cordarone) Aspirin (ASA) Cimetidine (Tagamet) Megestrol (Megace) NSAIDs (ibuprofen, naproxen, etc.) Piroxicam (Feldene) Propafenone (Rythmol SR) Propranolol (Inderal) Isoniazid (INH) Posaconazole (Noxafil) Barbiturates (Phenobarbital) Carbamazepine (Tegretol) Chlordiazepoxide (Librium) Cholestyramine (Questran) Griseofulvin Oral Contraceptives Rifampin Sucralfate (Carafate) Vitamin K   Coumadin (Warfarin) Major Herbal Interactions  Increased Warfarin Effect Decreased Warfarin Effect  Garlic Ginseng Ginkgo biloba Coenzyme Q10 Green tea St. Johns wort    Coumadin (Warfarin) FOOD Interactions  Eat a consistent number of servings per week of foods HIGH in Vitamin K (1 serving =  cup)  Collards (cooked, or boiled & drained) Kale (cooked, or boiled & drained) Mustard greens (cooked, or boiled & drained) Parsley *serving size only =   cup Spinach (cooked, or boiled & drained) Swiss chard (cooked, or boiled & drained) Turnip greens (cooked, or boiled & drained)  Eat a consistent number of servings per week of foods MEDIUM-HIGH in Vitamin K (1 serving = 1 cup)  Asparagus (cooked, or boiled & drained) Broccoli (cooked, boiled & drained, or raw & chopped) Brussel sprouts (cooked, or boiled & drained) *serving size only =  cup Lettuce, raw (green leaf, endive, romaine) Spinach, raw Turnip greens, raw & chopped   These websites have more information on Coumadin (warfarin):  FailFactory.se; VeganReport.com.au;     Aortic Valve Replacement/Mitral valve replacement Aortic valve replacement is surgical replacement of an aortic valve that cannot be repaired. This procedure may be done to replace:  A narrow aortic valve (aortic valve stenosis).  A deformed aortic valve (bicuspid aortic valve).  An aortic valve that does not close all  the way (aortic insufficiency).  The aortic valve is replaced with artificial (prosthetic) valve. Three types of prosthetic valves are available:  Mechanical valves made entirely from man-made materials.  Donor valves from human donors. These are only used in special situations.  Biological valves made from animal tissues.  The type of prosthetic valve used will be determined based on various factors, including your age, your lifestyle, and other medical conditions you have. This procedure is done using an open surgical technique, meaning that a large incision will be made in your chest over your heart. Tell a health care provider about:  Any allergies you have.  All medicines you are taking, including vitamins, herbs, eye drops, creams, and over-the-counter medicines.  Any problems you or family members have had with anesthetic medicines.  Any blood disorders you have.  Any surgeries you have had.  Any medical conditions you have.  Whether you are pregnant or  may be pregnant. What are the risks? Generally, this is a safe procedure. However, problems may occur, including:  Infection.  Bleeding.  Allergic reactions to medicines.  Damage to other structures or organs.  Blood clotting caused by the prosthetic valve.  Failure of the prosthetic valve.  What happens before the procedure?  Ask your health care provider about: ? Changing or stopping your regular medicines. This is especially important if you are taking diabetes medicines or blood thinners. ? Taking medicines such as aspirin and ibuprofen. These medicines can thin your blood. Do not take these medicines before your procedure if your health care provider instructs you not to.  Follow instructions from your health care provider about eating or drinking restrictions.  You may be given antibiotic medicine to help prevent infection.  You may have tests, such as: ? Echocardiogram. ? Electrocardiogram (ECG). ? Blood tests. ? Urine tests.  Plan to have someone take you home when you leave the hospital.  Plan to have someone with you for at least 24 hours after you leave the hospital. What happens during the procedure?  To reduce your risk of infection: ? Your health care team will wash or sanitize their hands. ? Your skin will be washed with soap.  An IV tube will be inserted into one of your veins.  You will be given one or more of the following: ? A medicine to help you relax (sedative). ? A medicine to make you fall asleep (general anesthetic).  You will be placed on a heart-lung bypass machine. This machine provides oxygen to your blood while your heart is undergoing surgery.  An incision will be made in your chest, over your heart.  Your damaged aortic valve will be removed.  A prosthetic valve will be sewn into your heart.  Your incision will be closed with stitches (sutures), skin glue, or adhesive tape.  A bandage (dressing) will be placed over your  incision. The procedure may vary among health care providers and hospitals. What happens after the procedure?  Your blood pressure, heart rate, breathing rate, and blood oxygen level will be monitored often until the medicines you were given have worn off.  Do not drive until your health care provider approves. This information is not intended to replace advice given to you by your health care provider. Make sure you discuss any questions you have with your health care provider. Document Released: 04/19/2005 Document Revised: 05/05/2016 Document Reviewed: 11/01/2015 Elsevier Interactive Patient Education  2017 [employer].

## 2017-11-17 NOTE — Plan of Care (Signed)
Pt extubated. CI >2 on levophed,neo, dopamine, and insulin.minimal CTD.

## 2017-11-17 NOTE — Procedures (Signed)
Extubation Procedure Note  Patient Details:   Name: Brandon [name] DOB: 1946-04-18 MRN: [id number]   Airway Documentation:  Airway 8 mm (Active)  Secured at (cm) 22 cm 11/17/2017  1:32 AM  Measured From Lips 11/17/2017  1:32 AM  Secured Location Right 11/17/2017  1:32 AM  Secured By Pink Tape 11/17/2017  1:32 AM  Cuff Pressure (cm H2O) 28 cm H2O 11/16/2017  8:57 PM  Site Condition Dry 11/17/2017  1:32 AM    Evaluation  O2 sats: stable throughout Complications: No apparent complications Patient did tolerate procedure well. Bilateral Breath Sounds: Rhonchi   Yes  [name] 11/17/2017, 2:18 AM    Pt extubated per rapid wean protocol. Pt performed NIF -23, VC 1.5L, pt had positive cuff leak and an IS of 500. Pt placed on 4 L Avis at this time. RT will continue to monitor.

## 2017-11-17 NOTE — Care Management Note (Addendum)
Case Management Note  Patient Details  Name: Brandon [name] MRN: [id number] Date of Birth: Jul 15, 1946  Subjective/Objective:   From home, post op CABG , MVR, AVR, extubated today,  CDK stage 2-3, OSA uses cpap at home, hx of previous cva with mild left hemipleia and dysarthria, chronic anxiety, chronic iron def anemia, weaning neo and levophed, conts on dopamine, chest tubes, await pt/ot eval.                 Action/Plan: NCM will follow for dc needs.   Expected Discharge Date:                  Expected Discharge Plan:     In-House Referral:     Discharge planning Services  CM Consult  Post Acute Care Choice:    Choice offered to:     DME Arranged:    DME Agency:     HH Arranged:    HH Agency:     Status of Service:  In process, will continue to follow  If discussed at Long Length of Stay Meetings, dates discussed:    Additional Comments:  Zenon Mayo, RN 11/17/2017, 10:35 AM

## 2017-11-17 NOTE — Progress Notes (Signed)
[address]       [place] [address]             [phone]        CARDIOTHORACIC SURGERY PROGRESS NOTE   R1 Day Post-Op Procedure(s) (LRB): AORTIC VALVE REPLACEMENT (AVR) with 70mm Magna Ease (N/A) MITRAL VALVE  REPLACEMENT with 28mm MagnaEase (N/A) CORONARY ARTERY BYPASS GRAFTING (CABG) x 1 (N/A) MAZE (N/A) TRANSESOPHAGEAL ECHOCARDIOGRAM (TEE) (N/A) ARTERIAL LINE INSERTION -RIGHT FEMORAL (Right) ENDOVEIN HARVEST OF GREATER SAPHENOUS VEIN (Right)  Subjective: Looks good and feels well.  Mild soreness in chest.  Denies SOB.  Objective: Vital signs: BP Readings from Last 1 Encounters:  11/17/17 104/67   Pulse Readings from Last 1 Encounters:  11/16/17 89   Resp Readings from Last 1 Encounters:  11/17/17 (!) 0   Temp Readings from Last 1 Encounters:  11/17/17 98.1 F (36.7 C)    Hemodynamics: PAP: (24-40)/(13-28) 31/19 CO:  [5.4 L/min-7.6 L/min] 6.5 L/min CI:  [2.4 L/min/m2-3.5 L/min/m2] 3 L/min/m2  Physical Exam:  Rhythm:   Junctional 60's  Breath sounds: clear  Heart sounds:  RRR  Incisions:  Dressings dry, intact  Abdomen:  Soft, non-distended, non-tender  Extremities:  Warm, well-perfused  Chest tubes:  low volume thin serosanguinous output, no air leak    Intake/Output from previous day: 12/06 0701 - 12/07 0700 In: 7719.8 [I.V.:5559.8; Blood:400; NG/GT:110; IV Piggyback:1650] Out: 3000 [Urine:1700; Blood:800; Chest Tube:500] Intake/Output this shift: Total I/O In: 119 [I.V.:119] Out: 125 [Urine:125]  Lab Results:  CBC: Recent Labs    11/16/17 2129 11/16/17 2143 11/17/17 0300  WBC 18.9*  --  15.3*  HGB 9.0* 9.2* 8.5*  HCT 27.6* 27.0* 26.0*  PLT 205  --  119*    BMET:  Recent Labs    11/16/17 2143 11/17/17 0300  NA 143 140  K 4.1 4.0  CL 110 113*  CO2  --  22  GLUCOSE 147* 117*  BUN 19 19  CREATININE 1.20 1.30*  CALCIUM  --  8.0*     PT/INR:   Recent Labs    11/16/17 1545  LABPROT 18.0*  INR 1.50     CBG (last 3)  Recent Labs    11/17/17 0306 11/17/17 0608 11/17/17 0721  GLUCAP 114* 135* 114*    ABG    Component Value Date/Time   PHART 7.308 (L) 11/17/2017 0308   PCO2ART 40.8 11/17/2017 0308   PO2ART 116.0 (H) 11/17/2017 0308   HCO3 20.5 11/17/2017 0308   TCO2 22 11/17/2017 0308   ACIDBASEDEF 5.0 (H) 11/17/2017 0308   O2SAT 98.0 11/17/2017 0308    CXR: PORTABLE CHEST 1 VIEW  COMPARISON:  11/16/2017 and earlier.  FINDINGS: Portable AP semi upright view at 0539 hours. Extubated. Enteric tube removed. Bilateral chest tubes and mediastinal tube remain. Stable right IJ approach Swan-Ganz catheter, tip at the main pulmonary artery level.  Resolved atelectasis or consolidation in the right upper lobe, but mildly lower lung volumes overall and increased perihilar and bibasilar opacity. No pneumothorax or pulmonary edema. Stable cardiac size and mediastinal contours.  IMPRESSION: 1. Extubated and enteric tube removed. Otherwise stable lines and tubes. 2. No pneumothorax or pulmonary edema. 3. Resolved right upper lobe consolidation or atelectasis, but lower lung volumes and increased bibasilar opacity favored to reflect a combination of small pleural effusions and atelectasis.   Electronically Signed   By: Genevie Ann M.D.   On: 11/17/2017 08:28   EKG: Junctional rhythm w/out acute  ischemic changes     Assessment/Plan: S/P Procedure(s) (LRB): AORTIC VALVE REPLACEMENT (AVR) with 64mm Magna Ease (N/A) MITRAL VALVE  REPLACEMENT with 39mm MagnaEase (N/A) CORONARY ARTERY BYPASS GRAFTING (CABG) x 1 (N/A) MAZE (N/A) TRANSESOPHAGEAL ECHOCARDIOGRAM (TEE) (N/A) ARTERIAL LINE INSERTION -RIGHT FEMORAL (Right) ENDOVEIN HARVEST OF GREATER SAPHENOUS VEIN (Right)  Overall doing well POD1 Maintaining junctional rhythm w/ stable HR and hemodynamics on low dose Neo and Levophed for BP support, PA pressures relatively low and high cardiac output Breathing comfortably  w/ O2 sats 97-99% on 4 L/min Acute on chronic diastolic CHF with expected post-op volume excess, weight approx 20 lbs > preop Expected post op acute blood loss anemia, Hgb down slightly 8.5 this morning Expected post op atelectasis, mild Recurrent paroxysmal atrial fibrillation  CKD stage II-III w/ adequate UOP and creatinine at baseline Type II diabetes mellitus, excellent glycemic control on insulin drip OSA - does not use CPAP at home Previous stroke w/ mild residual left hemiplegia and dysarthria Chronic anxiety Chronic iron-deficient anemia   Mobilize  Wean Neo and Levophed as tolerated  Continue low dose Dopamine today and wean once BP improved off Levophed  Hold diuretics until later today or tomorrow  D/C lines D/C tubes later today or tomorrow, depending on output  Add Levemir and wean insulin drip off  Start Coumadin  Restart low dose Amiodarone tomorrow   Rexene Alberts, MD 11/17/2017 9:09 AM

## 2017-11-18 ENCOUNTER — Inpatient Hospital Stay (HOSPITAL_COMMUNITY): Payer: Medicare HMO

## 2017-11-18 ENCOUNTER — Other Ambulatory Visit: Payer: Self-pay

## 2017-11-18 LAB — BASIC METABOLIC PANEL
Anion gap: 8 (ref 5–15)
BUN: 24 mg/dL — ABNORMAL HIGH (ref 6–20)
CHLORIDE: 109 mmol/L (ref 101–111)
CO2: 24 mmol/L (ref 22–32)
Calcium: 8.6 mg/dL — ABNORMAL LOW (ref 8.9–10.3)
Creatinine, Ser: 1.37 mg/dL — ABNORMAL HIGH (ref 0.61–1.24)
GFR calc non Af Amer: 50 mL/min — ABNORMAL LOW (ref >60)
GFR, EST AFRICAN AMERICAN: 58 mL/min — AB (ref >60)
Glucose, Bld: 124 mg/dL — ABNORMAL HIGH (ref 65–99)
POTASSIUM: 4.3 mmol/L (ref 3.5–5.1)
SODIUM: 141 mmol/L (ref 135–145)

## 2017-11-18 LAB — GLUCOSE, CAPILLARY
GLUCOSE-CAPILLARY: 126 mg/dL — AB (ref 65–99)
GLUCOSE-CAPILLARY: 116 mg/dL — AB (ref 65–99)
GLUCOSE-CAPILLARY: 111 mg/dL — AB (ref 65–99)
Glucose-Capillary: 113 mg/dL — ABNORMAL HIGH (ref 65–99)
Glucose-Capillary: 136 mg/dL — ABNORMAL HIGH (ref 65–99)
Glucose-Capillary: 130 mg/dL — ABNORMAL HIGH (ref 65–99)
Glucose-Capillary: 119 mg/dL — ABNORMAL HIGH (ref 65–99)

## 2017-11-18 LAB — CBC
HCT: 26.4 % — ABNORMAL LOW (ref 39.0–52.0)
Hemoglobin: 8.7 g/dL — ABNORMAL LOW (ref 13.0–17.0)
MCH: 32.1 pg (ref 26.0–34.0)
MCHC: 33 g/dL (ref 30.0–36.0)
MCV: 97.4 fL (ref 78.0–100.0)
PLATELETS: 115 10*3/uL — AB (ref 150–400)
RBC: 2.71 MIL/uL — AB (ref 4.22–5.81)
RDW: 14.5 % (ref 11.5–15.5)
WBC: 13.6 10*3/uL — AB (ref 4.0–10.5)

## 2017-11-18 LAB — PROTIME-INR
INR: 1.55
PROTHROMBIN TIME: 18.5 s — AB (ref 11.4–15.2)

## 2017-11-18 NOTE — Plan of Care (Signed)
Patient very tired, having some nausea, but ambulated to chair. Still has MS tubes in . Wife at bedside offering support. Does not have much appetite for now.

## 2017-11-18 NOTE — Plan of Care (Signed)
  Progressing Education: Ability to demonstrate proper wound care will improve 11/18/2017 2323 - Progressing by Reinaldo Berber, RN Knowledge of disease or condition will improve 11/18/2017 2323 - Progressing by Reinaldo Berber, RN Knowledge of the prescribed therapeutic regimen will improve 11/18/2017 2323 - Progressing by Reinaldo Berber, RN Activity: Risk for activity intolerance will decrease 11/18/2017 2323 - Progressing by Reinaldo Berber, RN Note Pt to chair today, will attempt to ambulate tomorrow Cardiac: Hemodynamic stability will improve 11/18/2017 2323 - Progressing by Reinaldo Berber, RN Note BP soft but off pressors, pacer dependent with junct rhythm underneath Clinical Measurements: Postoperative complications will be avoided or minimized 11/18/2017 2323 - Progressing by Reinaldo Berber, RN Respiratory: Respiratory status will improve 11/18/2017 2323 - Progressing by Reinaldo Berber, RN Skin Integrity: Wound healing without signs and symptoms of infection 11/18/2017 2323 - Progressing by Reinaldo Berber, RN Risk for impaired skin integrity will decrease 11/18/2017 2323 - Progressing by Reinaldo Berber, RN Urinary Elimination: Ability to achieve and maintain adequate renal perfusion and functioning will improve 11/18/2017 2323 - Progressing by Reinaldo Berber, RN Education: Knowledge of General Education information will improve 11/18/2017 2323 - Progressing by Reinaldo Berber, RN Health Behavior/Discharge Planning: Ability to manage health-related needs will improve 11/18/2017 2323 - Progressing by Reinaldo Berber, RN Nutrition: Adequate nutrition will be maintained 11/18/2017 2323 - Progressing by Reinaldo Berber, RN Note Pt with decreased appetite but nausea improved and tolerating cardiac diet this pm Coping: Level of anxiety will decrease 11/18/2017 2323 - Progressing by Reinaldo Berber, RN Pain Managment: General experience of comfort  will improve 11/18/2017 2323 - Progressing by Reinaldo Berber, RN Safety: Ability to remain free from injury will improve 11/18/2017 2323 - Progressing by Reinaldo Berber, RN

## 2017-11-18 NOTE — Progress Notes (Signed)
2 Days Post-Op Procedure(s) (LRB): AORTIC VALVE REPLACEMENT (AVR) with 34mm Magna Ease (N/A) MITRAL VALVE  REPLACEMENT with 51mm MagnaEase (N/A) CORONARY ARTERY BYPASS GRAFTING (CABG) x 1 (N/A) MAZE (N/A) TRANSESOPHAGEAL ECHOCARDIOGRAM (TEE) (N/A) ARTERIAL LINE INSERTION -RIGHT FEMORAL (Right) ENDOVEIN HARVEST OF GREATER SAPHENOUS VEIN (Right) Subjective:  No specific complaints. Reportedly had some nausea today. Was out of bed to chair for 3 hrs.  Objective: Vital signs in last 24 hours: Temp:  [97.4 F (36.3 C)-98.2 F (36.8 C)] 97.9 F (36.6 C) (12/08 1629) Cardiac Rhythm: Ventricular paced (12/08 1500) Resp:  [11-22] 18 (12/08 1900) BP: (82-116)/(55-88) 92/63 (12/08 1900) SpO2:  [95 %-98 %] 97 % (12/08 1900) Arterial Line BP: (89-121)/(41-63) 105/61 (12/08 1900) Weight:  [114 kg (251 lb 5.2 oz)] 114 kg (251 lb 5.2 oz) (12/08 0500)  Hemodynamic parameters for last 24 hours:    Intake/Output from previous day: 12/07 0701 - 12/08 0700 In: 1175.2 [I.V.:1025.2; IV Piggyback:150] Out: 7616 [Urine:3455; Chest Tube:380] Intake/Output this shift: No intake/output data recorded.  General appearance: alert and cooperative Neurologic: intact Heart: regular rate and rhythm, S1, S2 normal, no murmur, click, rub or gallop Lungs: clear to auscultation bilaterally Abdomen: soft, non-tender; bowel sounds normal; no masses,  no organomegaly Extremities: edema mild Wound: dressing dry  Lab Results: Recent Labs    11/17/17 1708 11/17/17 1716 11/18/17 0353  WBC 14.5*  --  13.6*  HGB 8.4* 8.5* 8.7*  HCT 25.7* 25.0* 26.4*  PLT 114*  --  115*   BMET:  Recent Labs    11/17/17 0300  11/17/17 1716 11/18/17 0353  NA 140  --  142 141  K 4.0  --  4.2 4.3  CL 113*  --  109 109  CO2 22  --   --  24  GLUCOSE 117*  --  132* 124*  BUN 19  --  22* 24*  CREATININE 1.30*   < > 1.20 1.37*  CALCIUM 8.0*  --   --  8.6*   < > = values in this interval not displayed.    PT/INR:   Recent Labs    11/18/17 0353  LABPROT 18.5*  INR 1.55   ABG    Component Value Date/Time   PHART 7.308 (L) 11/17/2017 0308   HCO3 20.5 11/17/2017 0308   TCO2 22 11/17/2017 1716   ACIDBASEDEF 5.0 (H) 11/17/2017 0308   O2SAT 98.0 11/17/2017 0308   CBG (last 3)  Recent Labs    11/18/17 0802 11/18/17 1137 11/18/17 1627  GLUCAP 136* 130* 116*   CLINICAL DATA:  Atelectasisendovein harvest of greater right greater saphenous vein, CABG, arterial line insertion right, aortic valve replacement all on 11/16/2017  EXAM: PORTABLE CHEST 1 VIEW  COMPARISON:  11/17/2017  FINDINGS: Changes from cardiac surgery are stable.  No mediastinal widening.  Lung volumes are low. There is opacity at both lung bases consistent with combination of small effusions and atelectasis. No pulmonary edema. No convincing pneumothorax.  Swan-Ganz catheter has been removed. Right internal jugular introducer sheath is stable. No change in the bilateral chest tubes are mediastinal tube.  IMPRESSION: 1. No acute findings. No mediastinal widening, pulmonary edema pneumothorax. 2. Low lung volumes with persistent lung base atelectasis and probable small effusions. 3. Remaining support apparatus is well positioned.   Electronically Signed   By: Lajean Manes M.D.   On: 11/18/2017 08:25  Assessment/Plan: S/P Procedure(s) (LRB): AORTIC VALVE REPLACEMENT (AVR) with 21mm Magna Ease (N/A) MITRAL VALVE  REPLACEMENT with 40mm MagnaEase (  N/A) CORONARY ARTERY BYPASS GRAFTING (CABG) x 1 (N/A) MAZE (N/A) TRANSESOPHAGEAL ECHOCARDIOGRAM (TEE) (N/A) ARTERIAL LINE INSERTION -RIGHT FEMORAL (Right) ENDOVEIN HARVEST OF GREATER SAPHENOUS VEIN (Right)  Hemodynamically stable on dop 3 AV paced at 80. Junctional 70's under pacer. Volume excess: diuresing on lasix drip. CT output has decreased. 200 cc last shift so should be able to remove tomorrow. DC arterial line. Coumadin started.   LOS: 2 days     Gaye Pollack 11/18/2017

## 2017-11-18 NOTE — Progress Notes (Signed)
PT Cancellation Note  Patient Details Name: Brandon [name] MRN: [id number] DOB: 1946-05-29   Cancelled Treatment:    Reason Eval/Treat Not Completed: Medical issues which prohibited therapy. Nursing reports pt still with mediastinal tube in place and only able to get to chair which he has already done with nursing. Will continue and eval when tube out.   [place] 11/18/2017, 3:10 PM [place]

## 2017-11-18 NOTE — Progress Notes (Signed)
OT Cancellation Note  Patient Details Name: Brandon [name] MRN: [id number] DOB: 1945/12/22   Cancelled Treatment:    Reason Eval/Treat Not Completed: Medical issues which prohibited therapy(mediastinal tube in place). Will follow up for OT eval as pt is appropriate and time allows.  Binnie Kand M.S., OTR/L Pager: [phone]   11/18/2017, 3:11 PM

## 2017-11-19 ENCOUNTER — Inpatient Hospital Stay (HOSPITAL_COMMUNITY): Payer: Medicare HMO

## 2017-11-19 LAB — BASIC METABOLIC PANEL
ANION GAP: 8 (ref 5–15)
BUN: 33 mg/dL — ABNORMAL HIGH (ref 6–20)
CALCIUM: 8.5 mg/dL — AB (ref 8.9–10.3)
CO2: 27 mmol/L (ref 22–32)
CREATININE: 1.75 mg/dL — AB (ref 0.61–1.24)
Chloride: 104 mmol/L (ref 101–111)
GFR calc non Af Amer: 37 mL/min — ABNORMAL LOW (ref >60)
GFR, EST AFRICAN AMERICAN: 43 mL/min — AB (ref >60)
Glucose, Bld: 108 mg/dL — ABNORMAL HIGH (ref 65–99)
Potassium: 3.5 mmol/L (ref 3.5–5.1)
SODIUM: 139 mmol/L (ref 135–145)

## 2017-11-19 LAB — CBC
HCT: 25.1 % — ABNORMAL LOW (ref 39.0–52.0)
HEMOGLOBIN: 8.1 g/dL — AB (ref 13.0–17.0)
MCH: 31.3 pg (ref 26.0–34.0)
MCHC: 32.3 g/dL (ref 30.0–36.0)
MCV: 96.9 fL (ref 78.0–100.0)
PLATELETS: 101 10*3/uL — AB (ref 150–400)
RBC: 2.59 MIL/uL — AB (ref 4.22–5.81)
RDW: 14.1 % (ref 11.5–15.5)
WBC: 11.9 10*3/uL — AB (ref 4.0–10.5)

## 2017-11-19 LAB — GLUCOSE, CAPILLARY
GLUCOSE-CAPILLARY: 99 mg/dL (ref 65–99)
GLUCOSE-CAPILLARY: 124 mg/dL — AB (ref 65–99)
Glucose-Capillary: 124 mg/dL — ABNORMAL HIGH (ref 65–99)
Glucose-Capillary: 115 mg/dL — ABNORMAL HIGH (ref 65–99)
Glucose-Capillary: 103 mg/dL — ABNORMAL HIGH (ref 65–99)

## 2017-11-19 LAB — PREPARE RBC (CROSSMATCH)

## 2017-11-19 LAB — PROTIME-INR
INR: 1.38
PROTHROMBIN TIME: 16.9 s — AB (ref 11.4–15.2)

## 2017-11-19 MED ORDER — POTASSIUM CHLORIDE 10 MEQ/50ML IV SOLN
10.0000 meq | INTRAVENOUS | Status: AC
  Administered 2017-11-19 (×3): 10 meq via INTRAVENOUS
  Filled 2017-11-19 (×3): qty 50

## 2017-11-19 MED ORDER — INSULIN ASPART 100 UNIT/ML ~~LOC~~ SOLN
0.0000 [IU] | Freq: Three times a day (TID) | SUBCUTANEOUS | Status: DC
  Administered 2017-11-20: 4 [IU] via SUBCUTANEOUS

## 2017-11-19 MED ORDER — SODIUM CHLORIDE 0.9 % IV SOLN
Freq: Once | INTRAVENOUS | Status: AC
  Administered 2017-11-19: 10:00:00 via INTRAVENOUS

## 2017-11-19 MED ORDER — WARFARIN SODIUM 2 MG PO TABS
4.0000 mg | ORAL_TABLET | Freq: Every day | ORAL | Status: DC
  Administered 2017-11-19 – 2017-11-20 (×2): 4 mg via ORAL
  Filled 2017-11-19 (×2): qty 2

## 2017-11-19 NOTE — Evaluation (Signed)
Physical Therapy Evaluation Patient Details Name: Brandon [name] MRN: [id number] DOB: September 07, 1946 Today's Date: 11/19/2017   History of Present Illness  Patient is a 71 year old moderately obese white male with history of cerebrovascular disease status post right hemispheric stroke in 2004, hypertension, aortic stenosis with aortic insufficiency, mitral regurgitation, and recurrent paroxysmal atrial fibrillation who has been Admitted for Aortic and Mitral Valve replacements, and CABG  Clinical Impression  Patient is s/p above surgery resulting in functional limitations due to the deficits listed below (see PT Problem List). Currently needing light mod assist for transfers; had walked in hallway with nursing earlier, and was symptomatic for dizziness with standing activities in room this session; Still, pt is very motivated, and I anticipate steady progress; Patient will benefit from skilled PT to increase their independence and safety with mobility to allow discharge to the venue listed below.       Follow Up Recommendations Home health PT;Supervision/Assistance - 24 hour    Equipment Recommendations  3in1 (PT)    Recommendations for Other Services OT consult(as ordered)     Precautions / Restrictions Precautions Precautions: Fall      Mobility  Bed Mobility               General bed mobility comments: in chair upon arrival  Transfers Overall transfer level: Needs assistance Equipment used: Rolling walker (2 wheeled) Transfers: Sit to/from Stand Sit to Stand: +2 safety/equipment;Mod assist         General transfer comment: Light mod assist to power up; cues for hand placement and safety and to use power from LEs to stand  Ambulation/Gait Ambulation/Gait assistance: Min assist;+2 safety/equipment Ambulation Distance (Feet): (March in place) Assistive device: Rolling walker (2 wheeled)       General Gait Details: Cues to self-monitor for activity tolerance;  reported some dizziness with incr time in standing  Stairs            Wheelchair Mobility    Modified Rankin (Stroke Patients Only)       Balance Overall balance assessment: Needs assistance           Standing balance-Leahy Scale: Poor(approaching Fair)                               Pertinent Vitals/Pain Pain Assessment: No/denies pain    Home Living Family/patient expects to be discharged to:: Private residence Living Arrangements: Spouse/significant other Available Help at Discharge: Available 24 hours/day;Family Type of Home: House Home Access: Stairs to enter Entrance Stairs-Rails: None Entrance Stairs-Number of Steps: 1 + 1 Home Layout: One level Home Equipment: [place] - 2 wheels;Walker - 4 wheels      Prior Function Level of Independence: Independent         Comments:  as of August of this year, Pt was fully independent with ADLs including yard work.  He recently underwent Lt partial repair RTC,per wife (June 2018), and was still undergoing therapy - Dr Tamera Punt      Hand Dominance   Dominant Hand: Right    Extremity/Trunk Assessment   Upper Extremity Assessment Upper Extremity Assessment: Defer to OT evaluation    Lower Extremity Assessment Lower Extremity Assessment: Generalized weakness       Communication   Communication: HOH  Cognition Arousal/Alertness: Awake/alert Behavior During Therapy: WFL for tasks assessed/performed Overall Cognitive Status: Within Functional Limits for tasks assessed  General Comments General comments (skin integrity, edema, etc.): Serial BPs as follows:   Sitting BP 87/60 HR 90 Standing BP 87/77 HR 90 Standing post 3 minutes BP 83/61 HR 90     Exercises     Assessment/Plan    PT Assessment Patient needs continued PT services  PT Problem List Decreased strength;Decreased activity tolerance;Decreased balance;Decreased  mobility;Decreased coordination;Decreased knowledge of use of DME;Decreased knowledge of precautions;Cardiopulmonary status limiting activity       PT Treatment Interventions DME instruction;Gait training;Stair training;Functional mobility training;Therapeutic activities;Therapeutic exercise;Balance training;Patient/family education    PT Goals (Current goals can be found in the Care Plan section)  Acute Rehab PT Goals Patient Stated Goal: Hopeful to get home soon and well PT Goal Formulation: With patient Time For Goal Achievement: 12/03/17 Potential to Achieve Goals: Good    Frequency Min 3X/week   Barriers to discharge        Co-evaluation               AM-PAC PT "6 Clicks" Daily Activity  Outcome Measure Difficulty turning over in bed (including adjusting bedclothes, sheets and blankets)?: A Lot Difficulty moving from lying on back to sitting on the side of the bed? : Unable Difficulty sitting down on and standing up from a chair with arms (e.g., wheelchair, bedside commode, etc,.)?: Unable Help needed moving to and from a bed to chair (including a wheelchair)?: A Lot Help needed walking in hospital room?: A Lot Help needed climbing 3-5 steps with a railing? : Total 6 Click Score: 9    End of Session   Activity Tolerance: Patient tolerated treatment well Patient left: in chair;with call bell/phone within reach;with family/visitor present Nurse Communication: Mobility status PT Visit Diagnosis: Other abnormalities of gait and mobility (R26.89);Muscle weakness (generalized) (M62.81)    Time: 3474-2595 PT Time Calculation (min) (ACUTE ONLY): 19 min   Charges:   PT Evaluation $PT Eval Moderate Complexity: 1 Mod     PT G Codes:        Roney Marion, PT  Acute Rehabilitation Services Pager [phone] Office [phone]   [place] 11/19/2017, 1:16 PM

## 2017-11-19 NOTE — Evaluation (Signed)
Occupational Therapy Evaluation Patient Details Name: Brandon [name] MRN: [id number] DOB: 1946/09/28 Today's Date: 11/19/2017    History of Present Illness Patient is a 71 year old moderately obese white male with history of cerebrovascular disease status post right hemispheric stroke in 2004, hypertension, aortic stenosis with aortic insufficiency, mitral regurgitation, and recurrent paroxysmal atrial fibrillation who has been Admitted for Aortic and Mitral Valve replacements, and CABG   Clinical Impression   Pt reports he was independent with ADL PTA. Currently pt requires min assist for short distance mobility, min assist for UB ADL, and max assist for LB ADL. Pt requires mod verbal cues to maintain sternal precautions throughout functional activities. Pt planning to d/c home with 24/7 supervision from his wife. Recommending HHOT for follow up to maximize independence and safety with ADL and functional mobility upon return home. Pt would benefit from continued skilled OT to address established.    Follow Up Recommendations  Home health OT;Supervision/Assistance - 24 hour    Equipment Recommendations  None recommended by OT    Recommendations for Other Services       Precautions / Restrictions Precautions Precautions: Fall Precaution Comments: chest tubes x2, pacing wires Restrictions Weight Bearing Restrictions: Yes Other Position/Activity Restrictions: sternal precautions      Mobility Bed Mobility Overal bed mobility: Needs Assistance Bed Mobility: Supine to Sit     Supine to sit: Min assist;HOB elevated     General bed mobility comments: Light min assist for trunk elevation to sitting. HOB elevated ~30 degrees. Cues to maintain sternal precautions  Transfers Overall transfer level: Needs assistance Equipment used: None Transfers: Sit to/from [employer] Sit to Stand: Min assist Stand pivot transfers: Min assist       General transfer comment:  Cues for technique and maintaing sternal precautions. Min assist to boost up from EOB and balance in standing    Balance Overall balance assessment: Needs assistance Sitting-balance support: No upper extremity supported;Feet supported Sitting balance-Leahy Scale: Good     Standing balance support: No upper extremity supported;During functional activity Standing balance-Leahy Scale: Fair Standing balance comment: min assist for standing balance                           ADL either performed or assessed with clinical judgement   ADL Overall ADL's : Needs assistance/impaired Eating/Feeding: Set up;Sitting   Grooming: Minimal assistance;Sitting   Upper Body Bathing: Minimal assistance;Sitting   Lower Body Bathing: Maximal assistance;Sit to/from stand   Upper Body Dressing : Minimal assistance;Sitting   Lower Body Dressing: Maximal assistance;Sit to/from stand   Toilet Transfer: Minimal assistance Armed forces technical officer Details (indicate cue type and reason): simulated, short distance mobility         Functional mobility during ADLs: Minimal assistance(for very short distance mobility) General ADL Comments: Educated pt on sternal precautions; pt requires mod cues during functional activities to maintain sternal precautions     Vision         Perception     Praxis      Pertinent Vitals/Pain Pain Assessment: No/denies pain     Hand Dominance Right   Extremity/Trunk Assessment Upper Extremity Assessment Upper Extremity Assessment: (difficult to assess due to sternal precautions)   Lower Extremity Assessment Lower Extremity Assessment: Defer to PT evaluation   Cervical / Trunk Assessment Cervical / Trunk Assessment: Normal   Communication Communication Communication: HOH   Cognition Arousal/Alertness: Awake/alert Behavior During Therapy: WFL for tasks assessed/performed Overall Cognitive Status:  Within Functional Limits for tasks assessed                                      General Comments      Exercises     Shoulder Instructions      Home Living Family/patient expects to be discharged to:: Private residence Living Arrangements: Spouse/significant other Available Help at Discharge: Available 24 hours/day;Family Type of Home: House Home Access: Stairs to enter [employer] of Steps: 1 + 1 Entrance Stairs-Rails: None Home Layout: One level     Bathroom Shower/Tub: Corporate investment banker: Standard Bathroom Accessibility: Yes   Home Equipment: Environmental consultant - 2 wheels;Walker - 4 wheels;Bedside commode;Tub bench   Additional Comments: Remodeling master bathroom this month with walk in shower and elevated toilets      Prior Functioning/Environment Level of Independence: Independent        Comments:  as of August of this year, Pt was fully independent with ADLs including yard work.  He recently underwent Lt partial repair RTC,per wife (June 2018), and was still undergoing therapy - Dr Tamera Punt         OT Problem List: Decreased activity tolerance;Impaired balance (sitting and/or standing);Decreased knowledge of use of DME or AE;Decreased knowledge of precautions;Cardiopulmonary status limiting activity;Obesity      OT Treatment/Interventions: Self-care/ADL training;Energy conservation;DME and/or AE instruction;Therapeutic activities;Balance training;Patient/family education    OT Goals(Current goals can be found in the care plan section) Acute Rehab OT Goals Patient Stated Goal: Hopeful to get home soon and well OT Goal Formulation: With patient/family Time For Goal Achievement: 12/03/17 Potential to Achieve Goals: Good ADL Goals Pt Will Perform Upper Body Bathing: with supervision;sitting Pt Will Perform Lower Body Bathing: with min assist;sit to/from stand Pt Will Transfer to Toilet: with supervision;ambulating;bedside commode Pt Will Perform Toileting - Clothing Manipulation and  hygiene: sit to/from stand;with min assist Additional ADL Goal #1: Pt will verbally recall sternal precautions and maintain throghout ADL.  OT Frequency: Min 2X/week   Barriers to D/C:            Co-evaluation              AM-PAC PT "6 Clicks" Daily Activity     Outcome Measure Help from another person eating meals?: A Little Help from another person taking care of personal grooming?: A Little Help from another person toileting, which includes using toliet, bedpan, or urinal?: A Lot Help from another person bathing (including washing, rinsing, drying)?: A Lot Help from another person to put on and taking off regular upper body clothing?: A Little Help from another person to put on and taking off regular lower body clothing?: A Lot 6 Click Score: 15   End of Session Nurse Communication: Mobility status;Other (comment)(check IV site)  Activity Tolerance: Patient tolerated treatment well Patient left: in chair;with call bell/phone within reach;with family/visitor present  OT Visit Diagnosis: Unsteadiness on feet (R26.81)                Time: 4403-4742 OT Time Calculation (min): 23 min Charges:  OT General Charges $OT Visit: 1 Visit OT Evaluation $OT Eval Moderate Complexity: 1 Mod OT Treatments $Therapeutic Activity: 8-22 mins G-Codes:     Demont Linford A. Ulice Brilliant, M.S., OTR/L Pager: [place] 11/19/2017, 2:04 PM

## 2017-11-19 NOTE — Progress Notes (Signed)
3 Days Post-Op Procedure(s) (LRB): AORTIC VALVE REPLACEMENT (AVR) with 64mm Magna Ease (N/A) MITRAL VALVE  REPLACEMENT with 18mm MagnaEase (N/A) CORONARY ARTERY BYPASS GRAFTING (CABG) x 1 (N/A) MAZE (N/A) TRANSESOPHAGEAL ECHOCARDIOGRAM (TEE) (N/A) ARTERIAL LINE INSERTION -RIGHT FEMORAL (Right) ENDOVEIN HARVEST OF GREATER SAPHENOUS VEIN (Right) Subjective: Has had some nausea.  Objective: Vital signs in last 24 hours: Temp:  [97.5 F (36.4 C)-98 F (36.7 C)] 98 F (36.7 C) (12/09 1215) Pulse Rate:  [90] 90 (12/09 1215) Cardiac Rhythm: A-V Sequential paced (12/09 0915) Resp:  [9-22] 11 (12/09 1100) BP: (82-114)/(42-88) 114/72 (12/09 1215) SpO2:  [93 %-99 %] 93 % (12/09 1215) Arterial Line BP: (99-109)/(53-63) 105/61 (12/08 1900) Weight:  [111.5 kg (245 lb 13 oz)] 111.5 kg (245 lb 13 oz) (12/09 0500)  Hemodynamic parameters for last 24 hours:    Intake/Output from previous day: 12/08 0701 - 12/09 0700 In: 1377.7 [P.O.:490; I.V.:737.7; IV Piggyback:150] Out: 2825 [Urine:2535; Chest Tube:290] Intake/Output this shift: Total I/O In: 589.6 [I.V.:213.6; Blood:376] Out: 200 [Urine:200]  General appearance: alert and cooperative Neurologic: intact Heart: regular rate and rhythm, S1, S2 normal, no murmur, click, rub or gallop Lungs: clear to auscultation bilaterally Abdomen: soft, non-tender; bowel sounds normal; no masses,  no organomegaly Extremities: edema moderate Wound: dressings dry  Lab Results: Recent Labs    11/18/17 0353 11/19/17 0353  WBC 13.6* 11.9*  HGB 8.7* 8.1*  HCT 26.4* 25.1*  PLT 115* 101*   BMET:  Recent Labs    11/18/17 0353 11/19/17 0353  NA 141 139  K 4.3 3.5  CL 109 104  CO2 24 27  GLUCOSE 124* 108*  BUN 24* 33*  CREATININE 1.37* 1.75*  CALCIUM 8.6* 8.5*    PT/INR:  Recent Labs    11/19/17 0353  LABPROT 16.9*  INR 1.38   ABG    Component Value Date/Time   PHART 7.308 (L) 11/17/2017 0308   HCO3 20.5 11/17/2017 0308   TCO2 22  11/17/2017 1716   ACIDBASEDEF 5.0 (H) 11/17/2017 0308   O2SAT 98.0 11/17/2017 0308   CBG (last 3)  Recent Labs    11/19/17 0359 11/19/17 0734 11/19/17 1124  GLUCAP 99 124* 124*   CLINICAL DATA:  Status post cardiac surgery.  EXAM: PORTABLE CHEST 1 VIEW  COMPARISON:  11/18/2017  FINDINGS: Lung base opacity, left greater than right, consistent with atelectasis likely with small effusions, is stable. No convincing pneumonia or pulmonary edema.  No pneumothorax.  No mediastinal widening.  Bilateral chest tubes, mediastinal tube and right internal jugular introducer sheath are stable in well positioned.  IMPRESSION: 1. No significant change from the previous day's study. 2. Persistent lung base opacity most likely due to atelectasis with small effusions. No evidence of pulmonary edema or pneumonia. 3. Remaining support apparatus is stable and well positioned.   Electronically Signed   By: Lajean Manes M.D.   On: 11/19/2017 07:12   Assessment/Plan: S/P Procedure(s) (LRB): AORTIC VALVE REPLACEMENT (AVR) with 55mm Magna Ease (N/A) MITRAL VALVE  REPLACEMENT with 44mm MagnaEase (N/A) CORONARY ARTERY BYPASS GRAFTING (CABG) x 1 (N/A) MAZE (N/A) TRANSESOPHAGEAL ECHOCARDIOGRAM (TEE) (N/A) ARTERIAL LINE INSERTION -RIGHT FEMORAL (Right) ENDOVEIN HARVEST OF GREATER SAPHENOUS VEIN (Right)  He still has borderline BP and requiring low dose dopamine. Preop EF 50%.  Rhythm is junctional under pacer in the 70's. Will continue AV pacing to maximize BP and CO.  CKD: creat has trended up over the last few days on lasix drip. He still has volume excess with  weight about 15 lbs over preop. Will continue lasix drip today.  Chest tube output low. Will remove.   INR down slightly. Will increase Coumadin.   LOS: 3 days    Gaye Pollack 11/19/2017

## 2017-11-20 ENCOUNTER — Inpatient Hospital Stay (HOSPITAL_COMMUNITY): Payer: Medicare HMO

## 2017-11-20 LAB — CBC
HEMATOCRIT: 27.5 % — AB (ref 39.0–52.0)
HEMOGLOBIN: 9.1 g/dL — AB (ref 13.0–17.0)
MCH: 31 pg (ref 26.0–34.0)
MCHC: 33.1 g/dL (ref 30.0–36.0)
MCV: 93.5 fL (ref 78.0–100.0)
Platelets: 133 10*3/uL — ABNORMAL LOW (ref 150–400)
RBC: 2.94 MIL/uL — AB (ref 4.22–5.81)
RDW: 15.1 % (ref 11.5–15.5)
WBC: 10.5 10*3/uL (ref 4.0–10.5)

## 2017-11-20 LAB — GLUCOSE, CAPILLARY
Glucose-Capillary: 82 mg/dL (ref 65–99)
Glucose-Capillary: 168 mg/dL — ABNORMAL HIGH (ref 65–99)
Glucose-Capillary: 61 mg/dL — ABNORMAL LOW (ref 65–99)
Glucose-Capillary: 99 mg/dL (ref 65–99)
Glucose-Capillary: 164 mg/dL — ABNORMAL HIGH (ref 65–99)

## 2017-11-20 LAB — BASIC METABOLIC PANEL
ANION GAP: 9 (ref 5–15)
BUN: 41 mg/dL — ABNORMAL HIGH (ref 6–20)
CHLORIDE: 101 mmol/L (ref 101–111)
CO2: 29 mmol/L (ref 22–32)
Calcium: 8.5 mg/dL — ABNORMAL LOW (ref 8.9–10.3)
Creatinine, Ser: 1.65 mg/dL — ABNORMAL HIGH (ref 0.61–1.24)
GFR calc non Af Amer: 40 mL/min — ABNORMAL LOW (ref >60)
GFR, EST AFRICAN AMERICAN: 47 mL/min — AB (ref >60)
Glucose, Bld: 84 mg/dL (ref 65–99)
POTASSIUM: 3.2 mmol/L — AB (ref 3.5–5.1)
SODIUM: 139 mmol/L (ref 135–145)

## 2017-11-20 LAB — PROTIME-INR
INR: 1.37
Prothrombin Time: 16.8 seconds — ABNORMAL HIGH (ref 11.4–15.2)

## 2017-11-20 MED ORDER — SODIUM CHLORIDE 0.9% FLUSH
10.0000 mL | Freq: Two times a day (BID) | INTRAVENOUS | Status: DC
  Administered 2017-11-21 – 2017-11-22 (×3): 10 mL

## 2017-11-20 MED ORDER — SODIUM CHLORIDE 0.9% FLUSH
3.0000 mL | Freq: Two times a day (BID) | INTRAVENOUS | Status: DC
  Administered 2017-11-20 – 2017-11-21 (×3): 3 mL via INTRAVENOUS

## 2017-11-20 MED ORDER — PATIENT'S GUIDE TO USING COUMADIN BOOK
Freq: Once | Status: AC
  Administered 2017-11-20: 19:00:00
  Filled 2017-11-20: qty 1

## 2017-11-20 MED ORDER — SODIUM CHLORIDE 0.9% FLUSH: 3.0000 mL | INTRAVENOUS | Status: DC | PRN

## 2017-11-20 MED ORDER — FERROUS SULFATE 325 (65 FE) MG PO TABS
325.0000 mg | ORAL_TABLET | Freq: Every day | ORAL | Status: DC
  Administered 2017-11-21 – 2017-11-24 (×4): 325 mg via ORAL
  Filled 2017-11-20 (×4): qty 1

## 2017-11-20 MED ORDER — SODIUM CHLORIDE 0.9 % IV SOLN: 250.0000 mL | INTRAVENOUS | Status: DC | PRN

## 2017-11-20 MED ORDER — POTASSIUM CHLORIDE 10 MEQ/50ML IV SOLN
10.0000 meq | INTRAVENOUS | Status: AC
  Administered 2017-11-20 (×3): 10 meq via INTRAVENOUS
  Filled 2017-11-20 (×3): qty 50

## 2017-11-20 MED ORDER — POTASSIUM CHLORIDE CRYS ER 20 MEQ PO TBCR
40.0000 meq | EXTENDED_RELEASE_TABLET | Freq: Two times a day (BID) | ORAL | Status: DC
  Administered 2017-11-20 – 2017-11-21 (×4): 40 meq via ORAL
  Filled 2017-11-20 (×4): qty 2

## 2017-11-20 MED ORDER — CHLORHEXIDINE GLUCONATE CLOTH 2 % EX PADS
6.0000 | MEDICATED_PAD | Freq: Every day | CUTANEOUS | Status: DC
  Administered 2017-11-20 – 2017-11-21 (×2): 6 via TOPICAL

## 2017-11-20 MED ORDER — SODIUM CHLORIDE 0.9% FLUSH: 10.0000 mL | INTRAVENOUS | Status: DC | PRN

## 2017-11-20 MED ORDER — MOVING RIGHT ALONG BOOK
Freq: Once | Status: AC
  Administered 2017-11-20: 12:00:00
  Filled 2017-11-20: qty 1

## 2017-11-20 MED ORDER — ASPIRIN EC 81 MG PO TBEC
81.0000 mg | DELAYED_RELEASE_TABLET | Freq: Every day | ORAL | Status: DC
  Administered 2017-11-20 – 2017-11-24 (×5): 81 mg via ORAL
  Filled 2017-11-20 (×5): qty 1

## 2017-11-20 NOTE — Progress Notes (Addendum)
[address]       [place] [address]             [phone]        CARDIOTHORACIC SURGERY PROGRESS NOTE   R4 Days Post-Op Procedure(s) (LRB): AORTIC VALVE REPLACEMENT (AVR) with 67mm Magna Ease (N/A) MITRAL VALVE  REPLACEMENT with 19mm MagnaEase (N/A) CORONARY ARTERY BYPASS GRAFTING (CABG) x 1 (N/A) MAZE (N/A) TRANSESOPHAGEAL ECHOCARDIOGRAM (TEE) (N/A) ARTERIAL LINE INSERTION -RIGHT FEMORAL (Right) ENDOVEIN HARVEST OF GREATER SAPHENOUS VEIN (Right)  Subjective: Looks good and feels well.  Minimal chest pain.  Primary complaint is chronic back pain and discomfort from right IJ sheath.  Appetite improving. No SOB.  Ambulating fairly well w/ assistance  Objective: Vital signs: BP Readings from Last 1 Encounters:  11/20/17 (!) 82/68   Pulse Readings from Last 1 Encounters:  11/20/17 80   Resp Readings from Last 1 Encounters:  11/20/17 11   Temp Readings from Last 1 Encounters:  11/20/17 (!) 97.5 F (36.4 C) (Oral)    Hemodynamics:    Physical Exam:  Rhythm:   Junctional - DDD pacing  Breath sounds: clear  Heart sounds:  RRR  Incisions:  Clean and dry  Abdomen:  Soft, non-distended, non-tender  Extremities:  Warm, well-perfused    Intake/Output from previous day: 12/09 0701 - 12/10 0700 In: 1168.7 [P.O.:50; I.V.:692.7; Blood:376; IV Piggyback:50] Out: 2320 [Urine:2200; Chest Tube:120] Intake/Output this shift: No intake/output data recorded.  Lab Results:  CBC: Recent Labs    11/19/17 0353 11/20/17 0411  WBC 11.9* 10.5  HGB 8.1* 9.1*  HCT 25.1* 27.5*  PLT 101* 133*    BMET:  Recent Labs    11/19/17 0353 11/20/17 0411  NA 139 139  K 3.5 3.2*  CL 104 101  CO2 27 29  GLUCOSE 108* 84  BUN 33* 41*  CREATININE 1.75* 1.65*  CALCIUM 8.5* 8.5*     PT/INR:   Recent Labs    11/20/17 0411  LABPROT 16.8*  INR 1.37    CBG (last 3)  Recent Labs    11/19/17 1819 11/19/17 2155 11/20/17 0758  GLUCAP 115* 103* 82     ABG    Component Value Date/Time   PHART 7.308 (L) 11/17/2017 0308   PCO2ART 40.8 11/17/2017 0308   PO2ART 116.0 (H) 11/17/2017 0308   HCO3 20.5 11/17/2017 0308   TCO2 22 11/17/2017 1716   ACIDBASEDEF 5.0 (H) 11/17/2017 0308   O2SAT 98.0 11/17/2017 0308    CXR: PORTABLE CHEST 1 VIEW  COMPARISON:  11/19/2017  FINDINGS: Right jugular dialysis catheter is stable. Chest tubes have been removed. There is no ensuing pneumothorax. Lungs are under aerated with bibasilar atelectasis. Mild vascular congestion is stable. No sign of interstitial edema.  IMPRESSION: Chest tube removal without pneumothorax.  Stable vascular congestion without pulmonary edema  Stable bibasilar atelectasis.   Electronically Signed   By: Marybelle Killings M.D.   On: 11/20/2017 08:00  Assessment/Plan: S/P Procedure(s) (LRB): AORTIC VALVE REPLACEMENT (AVR) with 64mm Magna Ease (N/A) MITRAL VALVE  REPLACEMENT with 23mm MagnaEase (N/A) CORONARY ARTERY BYPASS GRAFTING (CABG) x 1 (N/A) MAZE (N/A) TRANSESOPHAGEAL ECHOCARDIOGRAM (TEE) (N/A) ARTERIAL LINE INSERTION -RIGHT FEMORAL (Right) ENDOVEIN HARVEST OF GREATER SAPHENOUS VEIN (Right)  Overall doing well POD4 Maintaining AV paced rhythm w/ stable BP Junctional bradycardia under pacer Breathing comfortably w/ O2 sats 94-96% on RA Acute on chronic diastolic CHF with expected post-op volume excess, diuresing well and weight down 2 lbs but  still approx 10 lbs > preop Expected post op acute blood loss anemia, Hgb up 9.1 this morning Expected post op atelectasis, mild Recurrent paroxysmal atrial fibrillation  Elevated serum creatinine, likely due to prerenal azotemia +/- acute kidney injury caused by ATN w/ CKD stage III preop Hypokalemia, induced by loop diuretics Type II diabetes mellitus, excellent glycemic control  OSA - does not use CPAP at home Previous stroke w/ mild residual left hemiplegia and dysarthria Chronic anxiety Chronic  iron-deficient anemia   Mobilize  Hold amiodarone and continue DDD pacing for now  Continue low dose dopamine and lasix drip for now  Supplement potassium aggressively  Coumadin and low dose ASA  Low dose lovenox until INR increased  Insert PICC line and d/c old sleeve  Rexene Alberts, MD 11/20/2017 9:17 AM

## 2017-11-20 NOTE — Progress Notes (Signed)
Peripherally Inserted Central Catheter/Midline Placement  The IV Nurse has discussed with the patient and/or persons authorized to consent for the patient, the purpose of this procedure and the potential benefits and risks involved with this procedure.  The benefits include less needle sticks, lab draws from the catheter, and the patient may be discharged home with the catheter. Risks include, but not limited to, infection, bleeding, blood clot (thrombus formation), and puncture of an artery; nerve damage and irregular heartbeat and possibility to perform a PICC exchange if needed/ordered by physician.  Alternatives to this procedure were also discussed.  Bard Power PICC patient education guide, fact sheet on infection prevention and patient information card has been provided to patient /or left at bedside.    PICC/Midline Placement Documentation  PICC Double Lumen 11/20/17 PICC Left Brachial 49 cm 0 cm (Active)  Indication for Insertion or Continuance of Line Vasoactive infusions 11/20/2017  1:00 PM  Exposed Catheter (cm) 0 cm 11/20/2017  1:00 PM  Site Assessment Clean;Dry;Intact 11/20/2017  1:00 PM  Lumen #1 Status Flushed;Blood return noted 11/20/2017  1:00 PM  Lumen #2 Status Flushed;Blood return noted 11/20/2017  1:00 PM  Dressing Type Transparent 11/20/2017  1:00 PM  Dressing Status Clean;Dry;Intact 11/20/2017  1:00 PM  Dressing Intervention New dressing 11/20/2017  1:00 PM  Dressing Change Due 11/27/17 11/20/2017  1:00 PM       Christella Noa Albarece 11/20/2017, 1:59 PM

## 2017-11-20 NOTE — Progress Notes (Signed)
Physical Therapy Treatment Patient Details Name: Brandon [name] MRN: [id number] DOB: 1946/07/03 Today's Date: 11/20/2017    History of Present Illness Pt adm for Aortic and Mitral Valve replacements, and CABG. PMH - obesity, cerebrovascular disease status post right hemispheric stroke in 2004, hypertension, aortic stenosis with aortic insufficiency, mitral regurgitation, and recurrent paroxysmal afib.    PT Comments    Pt making good progress with mobility. Pt remains motivated and wife very supportive. Continue to feel pt will be able to return home with wife.   Follow Up Recommendations  Home health PT;Supervision/Assistance - 24 hour     Equipment Recommendations       Recommendations for Other Services       Precautions / Restrictions Precautions Precautions: Fall Restrictions Weight Bearing Restrictions: Yes Other Position/Activity Restrictions: sternal precautions    Mobility  Bed Mobility Overal bed mobility: Needs Assistance Bed Mobility: Sit to Supine       Sit to supine: Min assist   General bed mobility comments: Assist to bring legs up into bed  Transfers Overall transfer level: Needs assistance Equipment used: Ambulation equipment used(EVA walker) Transfers: Sit to/from Stand Sit to Stand: Min assist         General transfer comment: Assist to bring hips up and for balance. Verbal cues to place hands on knees to rise  Ambulation/Gait Ambulation/Gait assistance: Min assist Ambulation Distance (Feet): 250 Feet Assistive device: (EVA walker) Gait Pattern/deviations: Step-through pattern;Decreased step length - right;Decreased step length - left;Trunk flexed Gait velocity: At times too fast for his balance deficits   General Gait Details: Assist for balance and cues to not go too fast   Science writer    Modified Rankin (Stroke Patients Only)       Balance Overall balance assessment: Needs  assistance Sitting-balance support: No upper extremity supported;Feet supported Sitting balance-Leahy Scale: Good     Standing balance support: No upper extremity supported;During functional activity Standing balance-Leahy Scale: Fair Standing balance comment: min assist for standing balance                            Cognition Arousal/Alertness: Awake/alert Behavior During Therapy: WFL for tasks assessed/performed Overall Cognitive Status: Within Functional Limits for tasks assessed                                        Exercises      General Comments        Pertinent Vitals/Pain Pain Assessment: Faces Faces Pain Scale: Hurts little more Pain Location: back Pain Descriptors / Indicators: Grimacing Pain Intervention(s): Limited activity within patient's tolerance;Monitored during session;Repositioned    Home Living                      Prior Function            PT Goals (current goals can now be found in the care plan section) Progress towards PT goals: Progressing toward goals    Frequency    Min 3X/week      PT Plan Current plan remains appropriate    Co-evaluation              AM-PAC PT "6 Clicks" Daily Activity  Outcome Measure  Difficulty turning over in bed (including adjusting bedclothes, sheets  and blankets)?: Unable Difficulty moving from lying on back to sitting on the side of the bed? : Unable Difficulty sitting down on and standing up from a chair with arms (e.g., wheelchair, bedside commode, etc,.)?: Unable Help needed moving to and from a bed to chair (including a wheelchair)?: A Little Help needed walking in hospital room?: A Little Help needed climbing 3-5 steps with a railing? : A Lot 6 Click Score: 11    End of Session Equipment Utilized During Treatment: Gait belt Activity Tolerance: Patient tolerated treatment well Patient left: in bed;with call bell/phone within reach;with nursing/sitter  in room;with family/visitor present Nurse Communication: Mobility status PT Visit Diagnosis: Other abnormalities of gait and mobility (R26.89);Muscle weakness (generalized) (M62.81)     Time: 8309-4076 PT Time Calculation (min) (ACUTE ONLY): 28 min  Charges:  $Gait Training: 23-37 mins                    G Codes:       [facility] PT [place] 11/20/2017, 2:18 PM

## 2017-11-20 NOTE — Progress Notes (Signed)
OT Cancellation Note  Patient Details Name: Brandon [name] MRN: [id number] DOB: [birth year]   Cancelled Treatment:    Reason Eval/Treat Not Completed: Patient at procedure or test/ unavailable. Pt having sterile procedure done at this time. Will check back as able to progress with plan of care.   Norman Herrlich, MS OTR/L  Pager: [name] 11/20/2017, 1:50 PM

## 2017-11-21 ENCOUNTER — Encounter (HOSPITAL_COMMUNITY)
Admission: RE | Disposition: A | Discharge status: Home Health | Payer: Self-pay | Source: Ambulatory Visit | Attending: Thoracic Surgery (Cardiothoracic Vascular Surgery)

## 2017-11-21 ENCOUNTER — Other Ambulatory Visit: Payer: Self-pay

## 2017-11-21 DIAGNOSIS — I442 Atrioventricular block, complete: Secondary | ICD-10-CM

## 2017-11-21 HISTORY — PX: PACEMAKER IMPLANT: EP1218

## 2017-11-21 LAB — TYPE AND SCREEN
ABO/RH(D): A POS
Antibody Screen: NEGATIVE
UNIT DIVISION: 0
Unit division: 0

## 2017-11-21 LAB — CBC
HEMATOCRIT: 29.9 % — AB (ref 39.0–52.0)
Hemoglobin: 9.8 g/dL — ABNORMAL LOW (ref 13.0–17.0)
MCH: 30.9 pg (ref 26.0–34.0)
MCHC: 32.8 g/dL (ref 30.0–36.0)
MCV: 94.3 fL (ref 78.0–100.0)
Platelets: 167 10*3/uL (ref 150–400)
RBC: 3.17 MIL/uL — ABNORMAL LOW (ref 4.22–5.81)
RDW: 14.9 % (ref 11.5–15.5)
WBC: 10.4 10*3/uL (ref 4.0–10.5)

## 2017-11-21 LAB — BPAM RBC
Blood Product Expiration Date: 201812202359
Blood Product Expiration Date: 201812222359
ISSUE DATE / TIME: 201812060919
ISSUE DATE / TIME: 201812090852
UNIT TYPE AND RH: 6200
Unit Type and Rh: 6200

## 2017-11-21 LAB — BASIC METABOLIC PANEL
ANION GAP: 9 (ref 5–15)
BUN: 44 mg/dL — ABNORMAL HIGH (ref 6–20)
CALCIUM: 8.7 mg/dL — AB (ref 8.9–10.3)
CO2: 29 mmol/L (ref 22–32)
Chloride: 98 mmol/L — ABNORMAL LOW (ref 101–111)
Creatinine, Ser: 1.72 mg/dL — ABNORMAL HIGH (ref 0.61–1.24)
GFR calc Af Amer: 44 mL/min — ABNORMAL LOW (ref >60)
GFR calc non Af Amer: 38 mL/min — ABNORMAL LOW (ref >60)
GLUCOSE: 110 mg/dL — AB (ref 65–99)
Potassium: 3.8 mmol/L (ref 3.5–5.1)
Sodium: 136 mmol/L (ref 135–145)

## 2017-11-21 LAB — GLUCOSE, CAPILLARY: Glucose-Capillary: 114 mg/dL — ABNORMAL HIGH (ref 65–99)

## 2017-11-21 LAB — PROTIME-INR
INR: 1.37
Prothrombin Time: 16.7 seconds — ABNORMAL HIGH (ref 11.4–15.2)

## 2017-11-21 SURGERY — PACEMAKER IMPLANT

## 2017-11-21 MED ORDER — SODIUM CHLORIDE 0.9% FLUSH: 3.0000 mL | Freq: Two times a day (BID) | INTRAVENOUS | Status: DC

## 2017-11-21 MED ORDER — ACETAMINOPHEN 325 MG PO TABS: 325.0000 mg | ORAL_TABLET | ORAL | Status: DC | PRN

## 2017-11-21 MED ORDER — CEFAZOLIN SODIUM-DEXTROSE 1-4 GM/50ML-% IV SOLN
1.0000 g | Freq: Four times a day (QID) | INTRAVENOUS | Status: AC
  Administered 2017-11-21 – 2017-11-22 (×3): 1 g via INTRAVENOUS
  Filled 2017-11-21 (×3): qty 50

## 2017-11-21 MED ORDER — SODIUM CHLORIDE 0.9 % IR SOLN
80.0000 mg | Status: AC
  Administered 2017-11-21: 80 mg
  Filled 2017-11-21: qty 2

## 2017-11-21 MED ORDER — FENTANYL CITRATE (PF) 100 MCG/2ML IJ SOLN
INTRAMUSCULAR | Status: AC
  Filled 2017-11-21: qty 2

## 2017-11-21 MED ORDER — CEFAZOLIN SODIUM-DEXTROSE 2-4 GM/100ML-% IV SOLN
2.0000 g | INTRAVENOUS | Status: AC
  Administered 2017-11-21: 2 g via INTRAVENOUS
  Filled 2017-11-21: qty 100

## 2017-11-21 MED ORDER — SODIUM CHLORIDE 0.9 % IV SOLN: 250.0000 mL | INTRAVENOUS | Status: DC

## 2017-11-21 MED ORDER — MIDAZOLAM HCL 5 MG/5ML IJ SOLN
INTRAMUSCULAR | Status: DC | PRN
  Administered 2017-11-21 (×3): 1 mg via INTRAVENOUS

## 2017-11-21 MED ORDER — CHLORHEXIDINE GLUCONATE 4 % EX LIQD: 60.0000 mL | Freq: Once | CUTANEOUS | Status: DC

## 2017-11-21 MED ORDER — HEPARIN (PORCINE) IN NACL 2-0.9 UNIT/ML-% IJ SOLN
INTRAMUSCULAR | Status: AC | PRN
  Administered 2017-11-21: 500 mL

## 2017-11-21 MED ORDER — POTASSIUM CHLORIDE 10 MEQ/50ML IV SOLN
10.0000 meq | INTRAVENOUS | Status: AC
  Administered 2017-11-21 (×3): 10 meq via INTRAVENOUS
  Filled 2017-11-21 (×3): qty 50

## 2017-11-21 MED ORDER — FENTANYL CITRATE (PF) 100 MCG/2ML IJ SOLN
INTRAMUSCULAR | Status: DC | PRN
  Administered 2017-11-21: 25 ug via INTRAVENOUS
  Administered 2017-11-21: 12.5 ug via INTRAVENOUS

## 2017-11-21 MED ORDER — LIDOCAINE HCL (PF) 1 % IJ SOLN
INTRAMUSCULAR | Status: AC
  Filled 2017-11-21: qty 30

## 2017-11-21 MED ORDER — CEFAZOLIN SODIUM-DEXTROSE 2-4 GM/100ML-% IV SOLN
INTRAVENOUS | Status: AC
  Filled 2017-11-21: qty 100

## 2017-11-21 MED ORDER — ONDANSETRON HCL 4 MG/2ML IJ SOLN: 4.0000 mg | Freq: Four times a day (QID) | INTRAMUSCULAR | Status: DC | PRN

## 2017-11-21 MED ORDER — MIDAZOLAM HCL 2 MG/2ML IJ SOLN
INTRAMUSCULAR | Status: AC
  Filled 2017-11-21: qty 2

## 2017-11-21 MED ORDER — CHLORHEXIDINE GLUCONATE 4 % EX LIQD
CUTANEOUS | Status: AC
  Filled 2017-11-21: qty 15

## 2017-11-21 MED ORDER — SODIUM CHLORIDE 0.9% FLUSH: 3.0000 mL | INTRAVENOUS | Status: DC | PRN

## 2017-11-21 MED ORDER — CHLORHEXIDINE GLUCONATE 4 % EX LIQD
60.0000 mL | Freq: Once | CUTANEOUS | Status: AC
  Administered 2017-11-21: 4 via TOPICAL

## 2017-11-21 MED ORDER — SODIUM CHLORIDE 0.9 % IR SOLN
Status: AC
  Filled 2017-11-21: qty 2

## 2017-11-21 MED ORDER — SODIUM CHLORIDE 0.9 % IV SOLN
INTRAVENOUS | Status: DC
  Administered 2017-11-21: 12:00:00 via INTRAVENOUS

## 2017-11-21 MED ORDER — WARFARIN SODIUM 5 MG PO TABS
5.0000 mg | ORAL_TABLET | Freq: Every day | ORAL | Status: DC
  Administered 2017-11-21 – 2017-11-23 (×3): 5 mg via ORAL
  Filled 2017-11-21 (×3): qty 1

## 2017-11-21 MED ORDER — LIDOCAINE HCL (PF) 1 % IJ SOLN
INTRAMUSCULAR | Status: DC | PRN
  Administered 2017-11-21: 45 mL

## 2017-11-21 SURGICAL SUPPLY — 12 items
CABLE SURGICAL S-101-97-12 (CABLE) ×3 IMPLANT
CATH RIGHTSITE C315HIS02 (CATHETERS) ×3 IMPLANT
IPG PACE AZUR XT DR MRI W1DR01 (Pacemaker) ×1 IMPLANT
LEAD CAPSURE NOVUS 5076-52CM (Lead) ×3 IMPLANT
LEAD SELECT SECURE 3830 383069 (Lead) ×1 IMPLANT
PACE AZURE XT DR MRI W1DR01 (Pacemaker) ×3 IMPLANT
PAD DEFIB LIFELINK (PAD) ×3 IMPLANT
SELECT SECURE 3830 383069 (Lead) ×3 IMPLANT
SHEATH CLASSIC 7F (SHEATH) ×6 IMPLANT
SLITTER 6232ADJ (MISCELLANEOUS) ×3 IMPLANT
TRAY PACEMAKER INSERTION (PACKS) ×3 IMPLANT
WIRE HI TORQ VERSACORE-J 145CM (WIRE) ×3 IMPLANT

## 2017-11-21 NOTE — Progress Notes (Signed)
1st attempt to call report. Unable to reach nurse

## 2017-11-21 NOTE — Consult Note (Signed)
Cardiology Consultation:   Patient ID: Brandon [name]; [id number]; May 27, 1946   Admit date: 11/16/2017 Date of Consult: 11/21/2017  Primary Care Provider: Maurice Small, MD Primary Cardiologist: Dr. Aundra Dubin   Patient Profile:   Brandon [name] is a 71 y.o. male with a hx of PAFib, CVA, HTN, HLD, VHD, CAD, chronic diastolic CHF, CRI (III), COPD, who is being seen today for the evaluation of post-op heart block at the request of Dr. Roxy Manns.  History of Present Illness:   Brandon [name] is POD #5 from AVR/MVR both bioprosthetic, CABG x1 SVG to PDA, and MAZE with persistent heart block.  LABS K+ 3.8 BUN/Creat 44/1.72 WBC 10.4 H/H 9.8/29.9 plts 167 INR 1.37  Amiodarone last 200mg  dose 12/9 (was on out patient chronically) Metoprolol 12.5mg  once 12/6  Dopamine weaned off this AM On lasix gtt  Patient is OOB to chair, wife at bedside, they report he is ambulation with minimal if any assist to the BR, no CP, denies overt SOB.  Past Medical History:  Diagnosis Date  . Anemia 07/13/2017  . Anxiety   . Aortic insufficiency   . Aortic stenosis, moderate 07/13/2017  . Arthritis    back   . Carotid stenosis    Right carotid stent (widely patent) 40 - 59% left plaque 11/13  . COPD (chronic obstructive pulmonary disease) ([place])   . Coronary artery disease involving native coronary artery of native heart without angina pectoris   . Depression   . Dyslipidemia   . GERD (gastroesophageal reflux disease)   . Heart murmur   . Hemiplegia affecting unspecified side, late effect of cerebrovascular disease    resolved- from L side   . Hypertension   . Jaundice    resolved following ERCP & Cholecystectomy  . Mild emphysema ([place])   . Mitral regurgitation   . Mitral valve insufficiency and aortic valve insufficiency   . Myocardial infarction ([place])   . Paroxysmal atrial fibrillation ([facility])   . Pre-diabetes    per spouse  . S/P aortic valve replacement with bioprosthetic valve 11/16/2017   23  mm [facility] Ease stented bovine pericardial tissue valve  . S/P CABG x 1 11/16/2017   SVG to PDA with EVH via right thigh  . S/P Maze operation for atrial fibrillation 11/16/2017   Left side lesion set using bipolar radiofrequency and cryothermy ablation with clipping of LA appendage  . S/P mitral valve replacement with bioprosthetic valve 11/16/2017   27 mm [facility] Mitral stented bovine pericardial tissue valve  . Sleep apnea    does not wear CPAP  . Sleep concern    resulted in surgery- after + sleep test. Pt. doesn't have a problem any longer.   . Stroke ([place]) 03/11/2003   stent placed on the 31, 3, 2004, L side   . Wears glasses   . Wears hearing aid in both ears   . Wears partial dentures     Past Surgical History:  Procedure Laterality Date  . AORTIC VALVE REPLACEMENT N/A 11/16/2017   Procedure: AORTIC VALVE REPLACEMENT (AVR) with 49mm Magna Ease;  Surgeon: Rexene Alberts, MD;  Location: [place];  Service: Open Heart Surgery;  Laterality: N/A;  . ARTERIAL LINE INSERTION Right 11/16/2017   Procedure: ARTERIAL LINE INSERTION -RIGHT FEMORAL;  Surgeon: Rexene Alberts, MD;  Location: [place];  Service: Open Heart Surgery;  Laterality: Right;  . BACK SURGERY     lumbar back  . CHOLECYSTECTOMY    . CORONARY ARTERY  BYPASS GRAFT N/A 11/16/2017   Procedure: CORONARY ARTERY BYPASS GRAFTING (CABG) x 1;  Surgeon: Rexene Alberts, MD;  Location: [place];  Service: Open Heart Surgery;  Laterality: N/A;  . ENDOVEIN HARVEST OF GREATER SAPHENOUS VEIN Right 11/16/2017   Procedure: ENDOVEIN HARVEST OF GREATER SAPHENOUS VEIN;  Surgeon: Rexene Alberts, MD;  Location: [place];  Service: Open Heart Surgery;  Laterality: Right;  . ERCP N/A 05/31/2013   Procedure: ENDOSCOPIC RETROGRADE CHOLANGIOPANCREATOGRAPHY (ERCP);  Surgeon: Ladene Artist, MD;  Location: [facility];  Service: Endoscopy;  Laterality: N/A;  . FOOT SURGERY     right  . IR RADIOLOGY PERIPHERAL GUIDED IV START  09/28/2017  . IR  US GUIDE VASC ACCESS RIGHT  09/28/2017  . LAPAROSCOPIC CHOLECYSTECTOMY SINGLE PORT N/A 06/01/2013   Procedure: LAPAROSCOPIC CHOLECYSTECTOMY SINGLE PORT;  Surgeon: Adin Hector, MD;  Location: [facility];  Service: General;  Laterality: N/A;  . MAZE N/A 11/16/2017   Procedure: MAZE;  Surgeon: Rexene Alberts, MD;  Location: [place];  Service: Open Heart Surgery;  Laterality: N/A;  . MITRAL VALVE REPAIR N/A 11/16/2017   Procedure: MITRAL VALVE  REPLACEMENT with 59mm MagnaEase;  Surgeon: Rexene Alberts, MD;  Location: [place];  Service: Open Heart Surgery;  Laterality: N/A;  . POLYPECTOMY    . RIGHT/LEFT HEART CATH AND CORONARY ANGIOGRAPHY N/A 08/30/2017   Procedure: RIGHT/LEFT HEART CATH AND CORONARY ANGIOGRAPHY;  Surgeon: Larey Dresser, MD;  Location: [place] [facility];  Service: Cardiovascular;  Laterality: N/A;  . SHOULDER ARTHROSCOPY WITH ROTATOR CUFF REPAIR AND SUBACROMIAL DECOMPRESSION Left 05/18/2017  . SHOULDER ARTHROSCOPY WITH ROTATOR CUFF REPAIR AND SUBACROMIAL DECOMPRESSION Left 05/18/2017   Procedure: SHOULDER ARTHROSCOPY WITH ROTATOR CUFF REPAIR AND SUBACROMIAL DECOMPRESSION;  Surgeon: Tania Ade, MD;  Location: [place];  Service: Orthopedics;  Laterality: Left;  LEFT SHOULDER ARTHROSCOPY WITH ROTATOR CUFF REPAIR AND SUBACROMIAL DECOMPRESSION  . TEE WITHOUT CARDIOVERSION N/A 05/09/2017   Procedure: TRANSESOPHAGEAL ECHOCARDIOGRAM (TEE);  Surgeon: Skeet Latch, MD;  Location: [place];  Service: Cardiovascular;  Laterality: N/A;  . TEE WITHOUT CARDIOVERSION N/A 08/30/2017   Procedure: TRANSESOPHAGEAL ECHOCARDIOGRAM (TEE);  Surgeon: Larey Dresser, MD;  Location: [facility];  Service: Cardiovascular;  Laterality: N/A;  . TEE WITHOUT CARDIOVERSION N/A 11/16/2017   Procedure: TRANSESOPHAGEAL ECHOCARDIOGRAM (TEE);  Surgeon: Rexene Alberts, MD;  Location: [place];  Service: Open Heart Surgery;  Laterality: N/A;  . TONSILLECTOMY         Inpatient Medications: Scheduled Meds: .  acetaminophen  1,000 mg Oral Q6H  . aspirin EC  81 mg Oral Daily  . bisacodyl  10 mg Oral Daily   Or  . bisacodyl  10 mg Rectal Daily  . chlorhexidine  15 mL Mouth Rinse BID  . Chlorhexidine Gluconate Cloth  6 each Topical Daily  . citalopram  20 mg Oral QHS  . docusate sodium  200 mg Oral Daily  . enoxaparin (LOVENOX) injection  30 mg Subcutaneous Q24H  . ferrous sulfate  325 mg Oral Q breakfast  . mouth rinse  15 mL Mouth Rinse q12n4p  . pantoprazole  40 mg Oral Daily  . potassium chloride  40 mEq Oral BID  . sodium chloride flush  10-40 mL Intracatheter Q12H  . sodium chloride flush  3 mL Intravenous Q12H  . traZODone  50 mg Oral QHS  . warfarin  5 mg Oral q1800  . Warfarin - Physician Dosing Inpatient   Does not apply q1800   Continuous  Infusions: . sodium chloride 500 mL (11/19/17 0400)  . sodium chloride    . DOPamine 3 mcg/kg/min (11/21/17 0600)  . furosemide (LASIX) infusion 10 mg/hr (11/21/17 0600)  . lactated ringers Stopped (11/17/17 0400)  . lactated ringers 10 mL/hr at 11/21/17 0600  . potassium chloride     PRN Meds: sodium chloride, LORazepam, morphine injection, ondansetron (ZOFRAN) IV, oxyCODONE, sodium chloride flush, sodium chloride flush, traMADol  Allergies:    Allergies  Allergen Reactions  . Bee Venom Anaphylaxis and Swelling    Social History:   Social History   Socioeconomic History  . Marital status: Married    Spouse name: Not on file  . Number of children: 3  . Years of education: Not on file  . Highest education level: Not on file  Social Needs  . Financial resource strain: Not on file  . Food insecurity - worry: Not on file  . Food insecurity - inability: Not on file  . Transportation needs - medical: Not on file  . Transportation needs - non-medical: Not on file  Occupational History    Employer: RETIRED  Tobacco Use  . Smoking status: Former Smoker    Packs/day: 0.50    Years: 57.00    Pack years: 28.50    Types: Cigarettes      Last attempt to quit: 07/13/2017    Years since quitting: 0.3  . Smokeless tobacco: Never Used  Substance and Sexual Activity  . Alcohol use: Yes    Alcohol/week: 1.2 - 1.8 oz    Types: 2 - 3 Glasses of wine per week    Comment: moderate wine ; not since 8/ 2018  . Drug use: No  . Sexual activity: Yes  Other Topics Concern  . Not on file  Social History Narrative   Two living children.  Lives with wife.      Family History:   Family History  Problem Relation Age of Onset  . Stroke Father        No details  . Angina Mother      ROS:  Please see the history of present illness.  ROS  All other ROS reviewed and negative.     Physical Exam/Data:   Vitals:   11/21/17 0600 11/21/17 0654 11/21/17 0711 11/21/17 0745  BP: 109/64  117/74   Pulse:      Resp: 11  13   Temp:    97.8 F (36.6 C)  TempSrc:    Oral  SpO2: 96% 97% 98%   Weight:  236 lb 8.9 oz (107.3 kg)    Height:        Intake/Output Summary (Last 24 hours) at 11/21/2017 0804 Last data filed at 11/21/2017 0600 Gross per 24 hour  Intake 1949.8 ml  Output 1225 ml  Net 724.8 ml   Filed Weights   11/19/17 0500 11/20/17 0540 11/21/17 0654  Weight: 245 lb 13 oz (111.5 kg) 243 lb 2.7 oz (110.3 kg) 236 lb 8.9 oz (107.3 kg)   Body mass index is 34.93 kg/m.  General:  Well nourished, well developed, in no acute distress HEENT: normal Lymph: no adenopathy Neck: no JVD Endocrine:  No thryomegaly Vascular: No carotid bruits Cardiac:  RRR; soft SM, no gallops or rubs Lungs:  CTA b/l, no wheezing, rhonchi or rales  Abd: soft, nontender, obese Ext: no edema Musculoskeletal:  No deformities Skin: warm and dry  Neuro:  No gross focal abnormalities noted Psych:  Normal affect   EKG:  The  EKG was personally reviewed and demonstrates:   11/13/17 SR, PR 120ms, QRS 71ms, QTc 475ms 12//6/18 rhythm is difficultto establish, , V rate 45, regular, suspect junctional 11/17/17, accelerated junctional V rate 63bpm, QRS  1353ms, AVPaced beats towards end of tracing Telemetry:  Telemetry was personally reviewed and demonstrates:   AVPaced 80, underlying this AM looks 2:1 possibly Mobitz one with marked 1st degree AVblock V rate abut 40  Relevant CV Studies:  11/16/17: inter-op TEE Result status: Final result   Aortic valve: The valve is trileaflet. Severe valve thickening present. Severe valve calcification present. Severely decreased leaflet separation. Moderate to severe stenosis. Moderate to severe regurgitation.  Mitral valve: Suggestive of rheumatic valve disease with hockey stick appearance of anterior leaflet. Severe leaflet calcification is present. Predominantly posterior moderate mitral annular calcification. Moderate to severe regurgitation.  Right ventricle: Normal cavity size, wall thickness and ejection fraction.  Tricuspid valve: Mild regurgitation. The tricuspid valve regurgitation jet is central.     08/30/17: R/LHC 1. Normal right and left heart filling pressures and preserved cardiac output.  2. Moderate aortic stenosis by mean gradient.  Aortic root shot for aortic insufficiency was not done to save contrast, had TEE today earlier.  3. 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  The larger vessel supplying the PDA was totally occluded in the mid vessel with collaterals from the left system.  The small vessel supplying RV/PLV territory was patent.  The left coronary system did not have significant disease.   I am going to refer Mr Fester for surgical evaluation.  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  Would consider aortic and mitral valve repairs along with Maze and possible bypass to PDA  07/14/17: TTE Study Conclusions - Left ventricle: The cavity size was moderately dilated. There was   moderate concentric hypertrophy.  Systolic function was normal.   The estimated ejection fraction was in the range of 55% to 60%.   There is akinesis of the basalinferior myocardium. Features are   consistent with a pseudonormal left ventricular filling pattern,   with concomitant abnormal relaxation and increased filling   pressure (grade 2 diastolic dysfunction). Doppler parameters are   consistent with high ventricular filling pressure. - Aortic valve: Cusp separation was reduced. The peak aortic   velocity is elevated at 4.4m/sec but this is most likely high   due to increased flow across the AV from moderate AR. The mean   gradient of 88mmHg and VTI ratio of the LVOT/AV is most   consistent with moderate stenosis. There was moderate   regurgitation. Mean gradient (S): 33 mm Hg. VTI ratio of LVOT to   aortic valve: 0.27. Valve area (VTI): 0.85 cm^2. Valve area   (Vmax): 0.9 cm^2. Valve area (Vmean): 1.11 cm^2. Regurgitation   pressure half-time: 379 ms. - Mitral valve: Calcified annulus. Visually there appears to be   moderate to severe regurgitation. The PISA calculation does   appear accurate (mild MR by PISA). Valve area by pressure   half-time: 2.22 cm^2. Valve area by continuity equation (using   LVOT flow): 1.91 cm^2. - Left atrium: The atrium was moderately dilated. - Pulmonic valve: There was trivial regurgitation. - Pulmonary arteries: Systolic pressure could not be accurately   estimated. Impressions: - Compared to echo 04/2017, the mean AV gradient and peak AV   velocity are essentially unchanged. Recommendations:  Consider TEE to evaluate MR further. Visually appears to be at least moderate to severe MR.    Laboratory Data:  Chemistry Recent Labs  Lab 11/19/17 0353 11/20/17 0411 11/21/17 0430  NA 139 139 136  K 3.5 3.2* 3.8  CL 104 101 98*  CO2 27 29 29   GLUCOSE 108* 84 110*  BUN 33* 41* 44*  CREATININE 1.75* 1.65* 1.72*  CALCIUM 8.5* 8.5* 8.7*  GFRNONAA 37* 40* 38*  GFRAA 43* 47* 44*   ANIONGAP 8 9 9     No results for input(s): PROT, ALBUMIN, AST, ALT, ALKPHOS, BILITOT in the last 168 hours. Hematology Recent Labs  Lab 11/19/17 0353 11/20/17 0411 11/21/17 0430  WBC 11.9* 10.5 10.4  RBC 2.59* 2.94* 3.17*  HGB 8.1* 9.1* 9.8*  HCT 25.1* 27.5* 29.9*  MCV 96.9 93.5 94.3  MCH 31.3 31.0 30.9  MCHC 32.3 33.1 32.8  RDW 14.1 15.1 14.9  PLT 101* 133* 167   Cardiac EnzymesNo results for input(s): TROPONINI in the last 168 hours. No results for input(s): TROPIPOC in the last 168 hours.  BNPNo results for input(s): BNP, PROBNP in the last 168 hours.  DDimer No results for input(s): DDIMER in the last 168 hours.  Radiology/Studies:   Dg Chest Port 1 View Result Date: 11/20/2017 CLINICAL DATA:  New left PICC line placement. EXAM: PORTABLE CHEST 1 VIEW COMPARISON:  Earlier the same day FINDINGS: 1354 hours. The cardio pericardial silhouette is enlarged. Lung volumes are low. Minimal atelectasis at the bases. No pulmonary edema or focal airspace consolidation. Right IJ catheter tip is stable. Left PICC line tip overlies the distal SVC level near the junction with the RA. Patient is status post CABG, cardiac valve replacement and left atrial appendage excluder device noted. IMPRESSION: 1. PICC line tip overlies the distal SVC level, near the junction with the RA. 2. Cardiomegaly with mild vascular congestion and basilar atelectasis. Electronically Signed   By: Misty Stanley M.D.   On: 11/20/2017 14:22      Assessment and Plan:   1. POD #5 with persistent high degree AVblock, V rates about 40     Temp wire remain in place and is pacing at 80  Will need pacing, Dr. Lovena Le has seen and examined the patient, discussed PPM implant procedure, risks/benefits.  The patient is agreeable to proceed.  Will try to get on today's schedule, if not, will plan tomorrow.      2. VHD/CAD     s/p AVR/MVR (both bioprosthetic), CABG x1, and MAZE     POD #5     Patient denies any CP or SOB      Off dopamine this AM  3. Hx of PAF      CHA2DS2Vasc is 5, warfarin started     INR today 1.37  4. Acute/Chronic diastolic CHF     On lasix gtt, deferred to CTS      Fluid neg yesterday, pt reports good urine OP   For questions or updates, please contact [place] Please consult [email or web address] for contact info under Cardiology/STEMI.   Signed, Baldwin Jamaica, PA-C  11/21/2017 8:04 AM'  EP Attending  Patient seen and examined. Agree with above. The patient has high grade heart block after AVR/MVR. He has done well otherwise and is currently DDD pacing from his temporary wires. I have discussed the indications/risks/benefits/goals/expectations of the procedure and he wishes to proceed. Will hold Lasix drip prior to the procedure.  Mikle Bosworth.D.

## 2017-11-21 NOTE — Interval H&P Note (Signed)
History and Physical Interval Note:  11/21/2017 3:00 PM  Brandon [name]  has presented today for surgery, with the diagnosis of chb  The various methods of treatment have been discussed with the patient and family. After consideration of risks, benefits and other options for treatment, the patient has consented to  Procedure(s): PACEMAKER IMPLANT (N/A) as a surgical intervention .  The patient's history has been reviewed, patient examined, no change in status, stable for surgery.  I have reviewed the patient's chart and labs.  Questions were answered to the patient's satisfaction.     Cristopher Peru

## 2017-11-21 NOTE — Progress Notes (Signed)
Report called to [facility]. Patient going to 4E room 15.

## 2017-11-21 NOTE — Plan of Care (Signed)
Patient progressing well.  Encouraged patient and wife to utilize urinal for accurate In put and output, however patient finds it easier to urinate in toilet.  Patient remains on lasix drip and dopamine at this time.

## 2017-11-21 NOTE — Progress Notes (Signed)
2nd attempt to call report. Rn to call back

## 2017-11-21 NOTE — H&P (View-Only) (Signed)
Cardiology Consultation:   [name] ID: Brandon [name]; [id number]; 08/20/1946   Admit date: 11/16/2017 Date of Consult: 11/21/2017  Primary Care Provider: Maurice Small, MD Primary Cardiologist: Dr. Aundra Dubin   [name] Profile:   Brandon [name] is a 71 y.o. male with a hx of PAFib, CVA, HTN, HLD, VHD, CAD, chronic diastolic CHF, CRI (III), COPD, who is being seen today for Brandon evaluation of post-op heart block at Brandon request of Dr. Roxy Manns.  History of Present Illness:   Brandon [name] is POD #5 from AVR/MVR both bioprosthetic, CABG x1 SVG to PDA, and MAZE with persistent heart block.  LABS K+ 3.8 BUN/Creat 44/1.72 WBC 10.4 H/H 9.8/29.9 plts 167 INR 1.37  Amiodarone last 200mg  dose 12/9 (was on out [name] chronically) Metoprolol 12.5mg  once 12/6  Dopamine weaned off this AM On lasix gtt  [name] is OOB to chair, wife at bedside, they report he is ambulation with minimal if any assist to Brandon BR, no CP, denies overt SOB.  Past Medical History:  Diagnosis Date  . Anemia 07/13/2017  . Anxiety   . Aortic insufficiency   . Aortic stenosis, moderate 07/13/2017  . Arthritis    back   . Carotid stenosis    Right carotid stent (widely patent) 40 - 59% left plaque 11/13  . COPD (chronic obstructive pulmonary disease) ([place])   . Coronary artery disease involving native coronary artery of native heart without angina pectoris   . Depression   . Dyslipidemia   . GERD (gastroesophageal reflux disease)   . Heart murmur   . Hemiplegia affecting unspecified side, late effect of cerebrovascular disease    resolved- from L side   . Hypertension   . Jaundice    resolved following ERCP & Cholecystectomy  . Mild emphysema ([place])   . Mitral regurgitation   . Mitral valve insufficiency and aortic valve insufficiency   . Myocardial infarction ([place])   . Paroxysmal atrial fibrillation ([facility])   . Pre-diabetes    per spouse  . S/P aortic valve replacement with bioprosthetic valve 11/16/2017   23  mm [facility] Ease stented bovine pericardial tissue valve  . S/P CABG x 1 11/16/2017   SVG to PDA with EVH via right thigh  . S/P Maze operation for atrial fibrillation 11/16/2017   Left side lesion set using bipolar radiofrequency and cryothermy ablation with clipping of LA appendage  . S/P mitral valve replacement with bioprosthetic valve 11/16/2017   27 mm [facility] Mitral stented bovine pericardial tissue valve  . Sleep apnea    does not wear CPAP  . Sleep concern    resulted in surgery- after + sleep test. Pt. doesn't have a problem any longer.   . Stroke ([place]) 03/11/2003   stent placed on Brandon 31, 3, 2004, L side   . Wears glasses   . Wears hearing aid in both ears   . Wears partial dentures     Past Surgical History:  Procedure Laterality Date  . AORTIC VALVE REPLACEMENT N/A 11/16/2017   Procedure: AORTIC VALVE REPLACEMENT (AVR) with 69mm Magna Ease;  Surgeon: Rexene Alberts, MD;  Location: [place];  Service: Open Heart Surgery;  Laterality: N/A;  . ARTERIAL LINE INSERTION Right 11/16/2017   Procedure: ARTERIAL LINE INSERTION -RIGHT FEMORAL;  Surgeon: Rexene Alberts, MD;  Location: [place];  Service: Open Heart Surgery;  Laterality: Right;  . BACK SURGERY     lumbar back  . CHOLECYSTECTOMY    . CORONARY ARTERY  BYPASS GRAFT N/A 11/16/2017   Procedure: CORONARY ARTERY BYPASS GRAFTING (CABG) x 1;  Surgeon: Rexene Alberts, MD;  Location: [place];  Service: Open Heart Surgery;  Laterality: N/A;  . ENDOVEIN HARVEST OF GREATER SAPHENOUS VEIN Right 11/16/2017   Procedure: ENDOVEIN HARVEST OF GREATER SAPHENOUS VEIN;  Surgeon: Rexene Alberts, MD;  Location: [place];  Service: Open Heart Surgery;  Laterality: Right;  . ERCP N/A 05/31/2013   Procedure: ENDOSCOPIC RETROGRADE CHOLANGIOPANCREATOGRAPHY (ERCP);  Surgeon: Ladene Artist, MD;  Location: [facility];  Service: Endoscopy;  Laterality: N/A;  . FOOT SURGERY     right  . IR RADIOLOGY PERIPHERAL GUIDED IV START  09/28/2017  . IR  US GUIDE VASC ACCESS RIGHT  09/28/2017  . LAPAROSCOPIC CHOLECYSTECTOMY SINGLE PORT N/A 06/01/2013   Procedure: LAPAROSCOPIC CHOLECYSTECTOMY SINGLE PORT;  Surgeon: Adin Hector, MD;  Location: [facility];  Service: General;  Laterality: N/A;  . MAZE N/A 11/16/2017   Procedure: MAZE;  Surgeon: Rexene Alberts, MD;  Location: [place];  Service: Open Heart Surgery;  Laterality: N/A;  . MITRAL VALVE REPAIR N/A 11/16/2017   Procedure: MITRAL VALVE  REPLACEMENT with 6mm MagnaEase;  Surgeon: Rexene Alberts, MD;  Location: [place];  Service: Open Heart Surgery;  Laterality: N/A;  . POLYPECTOMY    . RIGHT/LEFT HEART CATH AND CORONARY ANGIOGRAPHY N/A 08/30/2017   Procedure: RIGHT/LEFT HEART CATH AND CORONARY ANGIOGRAPHY;  Surgeon: Larey Dresser, MD;  Location: [place] [facility];  Service: Cardiovascular;  Laterality: N/A;  . SHOULDER ARTHROSCOPY WITH ROTATOR CUFF REPAIR AND SUBACROMIAL DECOMPRESSION Left 05/18/2017  . SHOULDER ARTHROSCOPY WITH ROTATOR CUFF REPAIR AND SUBACROMIAL DECOMPRESSION Left 05/18/2017   Procedure: SHOULDER ARTHROSCOPY WITH ROTATOR CUFF REPAIR AND SUBACROMIAL DECOMPRESSION;  Surgeon: Tania Ade, MD;  Location: [place];  Service: Orthopedics;  Laterality: Left;  LEFT SHOULDER ARTHROSCOPY WITH ROTATOR CUFF REPAIR AND SUBACROMIAL DECOMPRESSION  . TEE WITHOUT CARDIOVERSION N/A 05/09/2017   Procedure: TRANSESOPHAGEAL ECHOCARDIOGRAM (TEE);  Surgeon: Skeet Latch, MD;  Location: [place];  Service: Cardiovascular;  Laterality: N/A;  . TEE WITHOUT CARDIOVERSION N/A 08/30/2017   Procedure: TRANSESOPHAGEAL ECHOCARDIOGRAM (TEE);  Surgeon: Larey Dresser, MD;  Location: [facility];  Service: Cardiovascular;  Laterality: N/A;  . TEE WITHOUT CARDIOVERSION N/A 11/16/2017   Procedure: TRANSESOPHAGEAL ECHOCARDIOGRAM (TEE);  Surgeon: Rexene Alberts, MD;  Location: [place];  Service: Open Heart Surgery;  Laterality: N/A;  . TONSILLECTOMY         Inpatient Medications: Scheduled Meds: .  acetaminophen  1,000 mg Oral Q6H  . aspirin EC  81 mg Oral Daily  . bisacodyl  10 mg Oral Daily   Or  . bisacodyl  10 mg Rectal Daily  . chlorhexidine  15 mL Mouth Rinse BID  . Chlorhexidine Gluconate Cloth  6 each Topical Daily  . citalopram  20 mg Oral QHS  . docusate sodium  200 mg Oral Daily  . enoxaparin (LOVENOX) injection  30 mg Subcutaneous Q24H  . ferrous sulfate  325 mg Oral Q breakfast  . mouth rinse  15 mL Mouth Rinse q12n4p  . pantoprazole  40 mg Oral Daily  . potassium chloride  40 mEq Oral BID  . sodium chloride flush  10-40 mL Intracatheter Q12H  . sodium chloride flush  3 mL Intravenous Q12H  . traZODone  50 mg Oral QHS  . warfarin  5 mg Oral q1800  . Warfarin - Physician Dosing Inpatient   Does not apply q1800   Continuous  Infusions: . sodium chloride 500 mL (11/19/17 0400)  . sodium chloride    . DOPamine 3 mcg/kg/min (11/21/17 0600)  . furosemide (LASIX) infusion 10 mg/hr (11/21/17 0600)  . lactated ringers Stopped (11/17/17 0400)  . lactated ringers 10 mL/hr at 11/21/17 0600  . potassium chloride     PRN Meds: sodium chloride, LORazepam, morphine injection, ondansetron (ZOFRAN) IV, oxyCODONE, sodium chloride flush, sodium chloride flush, traMADol  Allergies:    Allergies  Allergen Reactions  . Bee Venom Anaphylaxis and Swelling    Social History:   Social History   Socioeconomic History  . Marital status: Married    Spouse name: Not on file  . Number of children: 3  . Years of education: Not on file  . Highest education level: Not on file  Social Needs  . Financial resource strain: Not on file  . Food insecurity - worry: Not on file  . Food insecurity - inability: Not on file  . Transportation needs - medical: Not on file  . Transportation needs - non-medical: Not on file  Occupational History    Employer: RETIRED  Tobacco Use  . Smoking status: Former Smoker    Packs/day: 0.50    Years: 57.00    Pack years: 28.50    Types: Cigarettes      Last attempt to quit: 07/13/2017    Years since quitting: 0.3  . Smokeless tobacco: Never Used  Substance and Sexual Activity  . Alcohol use: Yes    Alcohol/week: 1.2 - 1.8 oz    Types: 2 - 3 Glasses of wine per week    Comment: moderate wine ; not since 8/ 2018  . Drug use: No  . Sexual activity: Yes  Other Topics Concern  . Not on file  Social History Narrative   Two living children.  Lives with wife.      Family History:   Family History  Problem Relation Age of Onset  . Stroke Father        No details  . Angina Mother      ROS:  Please see Brandon history of present illness.  ROS  All other ROS reviewed and negative.     Physical Exam/Data:   Vitals:   11/21/17 0600 11/21/17 0654 11/21/17 0711 11/21/17 0745  BP: 109/64  117/74   Pulse:      Resp: 11  13   Temp:    97.8 F (36.6 C)  TempSrc:    Oral  SpO2: 96% 97% 98%   Weight:  236 lb 8.9 oz (107.3 kg)    Height:        Intake/Output Summary (Last 24 hours) at 11/21/2017 0804 Last data filed at 11/21/2017 0600 Gross per 24 hour  Intake 1949.8 ml  Output 1225 ml  Net 724.8 ml   Filed Weights   11/19/17 0500 11/20/17 0540 11/21/17 0654  Weight: 245 lb 13 oz (111.5 kg) 243 lb 2.7 oz (110.3 kg) 236 lb 8.9 oz (107.3 kg)   Body mass index is 34.93 kg/m.  General:  Well nourished, well developed, in no acute distress HEENT: normal Lymph: no adenopathy Neck: no JVD Endocrine:  No thryomegaly Vascular: No carotid bruits Cardiac:  RRR; soft SM, no gallops or rubs Lungs:  CTA b/l, no wheezing, rhonchi or rales  Abd: soft, nontender, obese Ext: no edema Musculoskeletal:  No deformities Skin: warm and dry  Neuro:  No gross focal abnormalities noted Psych:  Normal affect   EKG:  Brandon  EKG was personally reviewed and demonstrates:   11/13/17 SR, PR 170ms, QRS 73ms, QTc 427ms 12//6/18 rhythm is difficultto establish, , V rate 45, regular, suspect junctional 11/17/17, accelerated junctional V rate 63bpm, QRS  1380ms, AVPaced beats towards end of tracing Telemetry:  Telemetry was personally reviewed and demonstrates:   AVPaced 80, underlying this AM looks 2:1 possibly Mobitz one with marked 1st degree AVblock V rate abut 40  Relevant CV Studies:  11/16/17: inter-op TEE Result status: Final result   Aortic valve: Brandon valve is trileaflet. Severe valve thickening present. Severe valve calcification present. Severely decreased leaflet separation. Moderate to severe stenosis. Moderate to severe regurgitation.  Mitral valve: Suggestive of rheumatic valve disease with hockey stick appearance of anterior leaflet. Severe leaflet calcification is present. Predominantly posterior moderate mitral annular calcification. Moderate to severe regurgitation.  Right ventricle: Normal cavity size, wall thickness and ejection fraction.  Tricuspid valve: Mild regurgitation. Brandon tricuspid valve regurgitation jet is central.     08/30/17: R/LHC 1. Normal right and left heart filling pressures and preserved cardiac output.  2. Moderate aortic stenosis by mean gradient.  Aortic root shot for aortic insufficiency was not done to save contrast, had TEE today earlier.  3. 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  Brandon larger vessel supplying Brandon PDA was totally occluded in Brandon mid vessel with collaterals from Brandon left system.  Brandon small vessel supplying RV/PLV territory was patent.  Brandon left coronary system did not have significant disease.   I am going to refer Brandon [name] for surgical evaluation.  He has moderate AS, mod-severe AI, severe Brandon (likely infarct-related Brandon), occluded anomalous RCA branch supplying PDA (with collaterals) and atrial fibrillation (symptomatic when occurs).  Would consider aortic and mitral valve repairs along with Maze and possible bypass to PDA  07/14/17: TTE Study Conclusions - Left ventricle: Brandon cavity size was moderately dilated. There was   moderate concentric hypertrophy.  Systolic function was normal.   Brandon estimated ejection fraction was in Brandon range of 55% to 60%.   There is akinesis of Brandon basalinferior myocardium. Features are   consistent with a pseudonormal left ventricular filling pattern,   with concomitant abnormal relaxation and increased filling   pressure (grade 2 diastolic dysfunction). Doppler parameters are   consistent with high ventricular filling pressure. - Aortic valve: Cusp separation was reduced. Brandon peak aortic   velocity is elevated at 4.25m/sec but this is most likely high   due to increased flow across Brandon AV from moderate AR. Brandon mean   gradient of 33mmHg and VTI ratio of Brandon LVOT/AV is most   consistent with moderate stenosis. There was moderate   regurgitation. Mean gradient (S): 33 mm Hg. VTI ratio of LVOT to   aortic valve: 0.27. Valve area (VTI): 0.85 cm^2. Valve area   (Vmax): 0.9 cm^2. Valve area (Vmean): 1.11 cm^2. Regurgitation   pressure half-time: 379 ms. - Mitral valve: Calcified annulus. Visually there appears to be   moderate to severe regurgitation. Brandon PISA calculation does   appear accurate (mild Brandon by PISA). Valve area by pressure   half-time: 2.22 cm^2. Valve area by continuity equation (using   LVOT flow): 1.91 cm^2. - Left atrium: Brandon atrium was moderately dilated. - Pulmonic valve: There was trivial regurgitation. - Pulmonary arteries: Systolic pressure could not be accurately   estimated. Impressions: - Compared to echo 04/2017, Brandon mean AV gradient and peak AV   velocity are essentially unchanged. Recommendations:  Consider TEE to evaluate Brandon further. Visually appears to be at least moderate to severe Brandon.    Laboratory Data:  Chemistry Recent Labs  Lab 11/19/17 0353 11/20/17 0411 11/21/17 0430  NA 139 139 136  K 3.5 3.2* 3.8  CL 104 101 98*  CO2 27 29 29   GLUCOSE 108* 84 110*  BUN 33* 41* 44*  CREATININE 1.75* 1.65* 1.72*  CALCIUM 8.5* 8.5* 8.7*  GFRNONAA 37* 40* 38*  GFRAA 43* 47* 44*   ANIONGAP 8 9 9     No results for input(s): PROT, ALBUMIN, AST, ALT, ALKPHOS, BILITOT in Brandon last 168 hours. Hematology Recent Labs  Lab 11/19/17 0353 11/20/17 0411 11/21/17 0430  WBC 11.9* 10.5 10.4  RBC 2.59* 2.94* 3.17*  HGB 8.1* 9.1* 9.8*  HCT 25.1* 27.5* 29.9*  MCV 96.9 93.5 94.3  MCH 31.3 31.0 30.9  MCHC 32.3 33.1 32.8  RDW 14.1 15.1 14.9  PLT 101* 133* 167   Cardiac EnzymesNo results for input(s): TROPONINI in Brandon last 168 hours. No results for input(s): TROPIPOC in Brandon last 168 hours.  BNPNo results for input(s): BNP, PROBNP in Brandon last 168 hours.  DDimer No results for input(s): DDIMER in Brandon last 168 hours.  Radiology/Studies:   Dg Chest Port 1 View Result Date: 11/20/2017 CLINICAL DATA:  New left PICC line placement. EXAM: PORTABLE CHEST 1 VIEW COMPARISON:  Earlier Brandon same day FINDINGS: 1354 hours. Brandon cardio pericardial silhouette is enlarged. Lung volumes are low. Minimal atelectasis at Brandon bases. No pulmonary edema or focal airspace consolidation. Right IJ catheter tip is stable. Left PICC line tip overlies Brandon distal SVC level near Brandon junction with Brandon RA. [name] is status post CABG, cardiac valve replacement and left atrial appendage excluder device noted. IMPRESSION: 1. PICC line tip overlies Brandon distal SVC level, near Brandon junction with Brandon RA. 2. Cardiomegaly with mild vascular congestion and basilar atelectasis. Electronically Signed   By: Misty Stanley M.D.   On: 11/20/2017 14:22      Assessment and Plan:   1. POD #5 with persistent high degree AVblock, V rates about 40     Temp wire remain in place and is pacing at 80  Will need pacing, Dr. Lovena Le has seen and examined Brandon [name], discussed PPM implant procedure, risks/benefits.  Brandon [name] is agreeable to proceed.  Will try to get on today's schedule, if not, will plan tomorrow.      2. VHD/CAD     s/p AVR/MVR (both bioprosthetic), CABG x1, and MAZE     POD #5     [name] denies any CP or SOB      Off dopamine this AM  3. Hx of PAF      CHA2DS2Vasc is 5, warfarin started     INR today 1.37  4. Acute/Chronic diastolic CHF     On lasix gtt, deferred to CTS      Fluid neg yesterday, pt reports good urine OP   For questions or updates, please contact [facility] Please consult [email or web address] for contact info under Cardiology/STEMI.   Signed, Baldwin Jamaica, PA-C  11/21/2017 8:04 AM'  EP Attending  [name] seen and examined. Agree with above. Brandon [name] has high grade heart block after AVR/MVR. He has done well otherwise and is currently DDD pacing from his temporary wires. I have discussed Brandon indications/risks/benefits/goals/expectations of Brandon procedure and he wishes to proceed. Will hold Lasix drip prior to Brandon procedure.  Mikle Bosworth.D.

## 2017-11-21 NOTE — Plan of Care (Signed)
  Education: Ability to demonstrate proper wound care will improve 11/21/2017 2304 - Progressing by Drenda Freeze, RN   Education: Knowledge of disease or condition will improve 11/21/2017 2304 - Progressing by Drenda Freeze, RN   Education: Knowledge of the prescribed therapeutic regimen will improve 11/21/2017 2304 - Progressing by Drenda Freeze, RN

## 2017-11-21 NOTE — Progress Notes (Signed)
OT Cancellation Note  Patient Details Name: DEMPSEY AHONEN MRN: [id number] DOB: Aug 08, 1946   Cancelled Treatment:    Reason Eval/Treat Not Completed: Patient at procedure or test/ unavailable. Pt off unit for pacemaker implant at this time. Will check back as able.   Norman Herrlich, MS OTR/L  Pager: [place] A Jed Kutch 11/21/2017, 3:10 PM

## 2017-11-21 NOTE — Progress Notes (Signed)
Physical Therapy Treatment Patient Details Name: Brandon [name] MRN: [id number] DOB: 1946-02-10 Today's Date: 11/21/2017    History of Present Illness Pt adm for Aortic and Mitral Valve replacements, and CABG. PMH - obesity, cerebrovascular disease status post right hemispheric stroke in 2004, hypertension, aortic stenosis with aortic insufficiency, mitral regurgitation, and recurrent paroxysmal afib.    PT Comments    Pt making excellent progress with mobility.   Follow Up Recommendations  Home health PT;Supervision/Assistance - 24 hour     Equipment Recommendations  None recommended by PT    Recommendations for Other Services       Precautions / Restrictions Precautions Precautions: Fall Restrictions Weight Bearing Restrictions: Yes(sternal precations) Other Position/Activity Restrictions: sternal precautions    Mobility  Bed Mobility Overal bed mobility: Needs Assistance Bed Mobility: Sit to Sidelying         Sit to sidelying: Min assist General bed mobility comments: Assist to bring legs up into bed  Transfers Overall transfer level: Needs assistance Equipment used: 4-wheeled walker Transfers: Sit to/from Stand Sit to Stand: Min guard         General transfer comment: Verbal cues for hand placement on knees to follow sternal precautions  Ambulation/Gait Ambulation/Gait assistance: Supervision Ambulation Distance (Feet): 350 Feet Assistive device: 4-wheeled walker Gait Pattern/deviations: Step-through pattern;Decreased stride length Gait velocity: Steady speed Gait velocity interpretation: Below normal speed for age/gender General Gait Details: Assist for safety   Stairs            Wheelchair Mobility    Modified Rankin (Stroke Patients Only)       Balance Overall balance assessment: Needs assistance Sitting-balance support: No upper extremity supported;Feet supported Sitting balance-Leahy Scale: Good     Standing balance support:  No upper extremity supported;During functional activity Standing balance-Leahy Scale: Fair                              Cognition Arousal/Alertness: Awake/alert Behavior During Therapy: WFL for tasks assessed/performed Overall Cognitive Status: Within Functional Limits for tasks assessed                                 General Comments: Question if there may be some baseline memory loss. Pt possibly using humor to cover this. Also is hard of hearing which makes it difficult to determine.       Exercises      General Comments        Pertinent Vitals/Pain Pain Assessment: No/denies pain    Home Living                      Prior Function            PT Goals (current goals can now be found in the care plan section) Progress towards PT goals: Progressing toward goals;Goals met and updated - see care plan    Frequency    Min 3X/week      PT Plan Current plan remains appropriate    Co-evaluation              AM-PAC PT "6 Clicks" Daily Activity  Outcome Measure  Difficulty turning over in bed (including adjusting bedclothes, sheets and blankets)?: Unable Difficulty moving from lying on back to sitting on the side of the bed? : Unable Difficulty sitting down on and standing up from a chair with arms (e.g.,  wheelchair, bedside commode, etc,.)?: Unable Help needed moving to and from a bed to chair (including a wheelchair)?: A Little Help needed walking in hospital room?: A Little Help needed climbing 3-5 steps with a railing? : A Little 6 Click Score: 12    End of Session Equipment Utilized During Treatment: Gait belt Activity Tolerance: Patient tolerated treatment well Patient left: in bed;with call bell/phone within reach;with bed alarm set Nurse Communication: Mobility status PT Visit Diagnosis: Other abnormalities of gait and mobility (R26.89);Muscle weakness (generalized) (M62.81)     Time: 9241-5516 PT Time  Calculation (min) (ACUTE ONLY): 25 min  Charges:  $Gait Training: 23-37 mins                    G Codes:       [facility] PT [place] 11/21/2017, 12:10 PM

## 2017-11-21 NOTE — Progress Notes (Signed)
Spoke with Dr Lovena Le, orders to DC left IV PICC line being placed.

## 2017-11-21 NOTE — Progress Notes (Signed)
Patient arrived on the unit from thae cath lab, assesment completed see flowsheet, placed on tele ccmd notified, patient oriented to room and staff, bed in lowest position, call bell in reach will continue to monitor.

## 2017-11-21 NOTE — Progress Notes (Signed)
[address]       [place] [address]             [phone]        CARDIOTHORACIC SURGERY PROGRESS NOTE   R5 Days Post-Op Procedure(s) (LRB): AORTIC VALVE REPLACEMENT (AVR) with 1mm Magna Ease (N/A) MITRAL VALVE  REPLACEMENT with 50mm MagnaEase (N/A) CORONARY ARTERY BYPASS GRAFTING (CABG) x 1 (N/A) MAZE (N/A) TRANSESOPHAGEAL ECHOCARDIOGRAM (TEE) (N/A) ARTERIAL LINE INSERTION -RIGHT FEMORAL (Right) ENDOVEIN HARVEST OF GREATER SAPHENOUS VEIN (Right)  Subjective: Feels pretty well  Objective: Vital signs: BP Readings from Last 1 Encounters:  11/21/17 117/74   Pulse Readings from Last 1 Encounters:  11/20/17 80   Resp Readings from Last 1 Encounters:  11/21/17 13   Temp Readings from Last 1 Encounters:  11/21/17 97.8 F (36.6 C) (Oral)    Hemodynamics:    Physical Exam:  Rhythm:   Sinus w/ 3rd degree AV block, AV pacing  Breath sounds: clear  Heart sounds:  RRR  Incisions:  Clean and dry  Abdomen:  Soft, non-distended, non-tender  Extremities:  Warm, well-perfused    Intake/Output from previous day: 12/10 0701 - 12/11 0700 In: 1975.7 [P.O.:1230; I.V.:595.7; IV Piggyback:150] Out: 1425 [Urine:1425] Intake/Output this shift: No intake/output data recorded.  Lab Results:  CBC: Recent Labs    11/20/17 0411 11/21/17 0430  WBC 10.5 10.4  HGB 9.1* 9.8*  HCT 27.5* 29.9*  PLT 133* 167    BMET:  Recent Labs    11/20/17 0411 11/21/17 0430  NA 139 136  K 3.2* 3.8  CL 101 98*  CO2 29 29  GLUCOSE 84 110*  BUN 41* 44*  CREATININE 1.65* 1.72*  CALCIUM 8.5* 8.7*     PT/INR:   Recent Labs    11/21/17 0430  LABPROT 16.7*  INR 1.37    CBG (last 3)  Recent Labs    11/20/17 1849 11/20/17 2110 11/21/17 0743  GLUCAP 99 164* 114*    ABG    Component Value Date/Time   PHART 7.308 (L) 11/17/2017 0308   PCO2ART 40.8 11/17/2017 0308   PO2ART 116.0 (H) 11/17/2017 0308   HCO3 20.5 11/17/2017 0308   TCO2 22 11/17/2017  1716   ACIDBASEDEF 5.0 (H) 11/17/2017 0308   O2SAT 98.0 11/17/2017 0308    CXR: n/a  Assessment/Plan: S/P Procedure(s) (LRB): AORTIC VALVE REPLACEMENT (AVR) with 24mm Magna Ease (N/A) MITRAL VALVE  REPLACEMENT with 72mm MagnaEase (N/A) CORONARY ARTERY BYPASS GRAFTING (CABG) x 1 (N/A) MAZE (N/A) TRANSESOPHAGEAL ECHOCARDIOGRAM (TEE) (N/A) ARTERIAL LINE INSERTION -RIGHT FEMORAL (Right) ENDOVEIN HARVEST OF GREATER SAPHENOUS VEIN (Right)  Overall doing well POD5 Maintaining AV paced rhythm w/ stable BP CHB under pacer Breathing comfortably w/ O2 sats 94-96% on RA Acute on chronic diastolic CHF with expected post-op volume excess,diuresing well and weight down 7 lbs but still approx 4 lbs > preop Expected post op acute blood loss anemia,Hgb up 9.8 this morning Expected post op atelectasis,mild Recurrent paroxysmal atrial fibrillation  Elevated serum creatinine, likely due to prerenal azotemia +/- acute kidney injury caused by ATN w/ CKD stage III preop Hypokalemia, induced by loop diuretics Type II diabetes mellitus,excellentglycemic control  OSA - does not use CPAP at home Previous stroke w/ mild residual left hemiplegia and dysarthria Chronic anxiety Chronic iron-deficient anemia   Mobilize  Continue DDD pacing and consult EPS for PPM placement  Supplement potassium   Coumadin and low dose ASA  Low dose lovenox until INR increased  Transfer step down   Rexene Alberts, MD 11/21/2017 8:05 AM

## 2017-11-22 ENCOUNTER — Inpatient Hospital Stay (HOSPITAL_COMMUNITY): Payer: Medicare HMO

## 2017-11-22 ENCOUNTER — Encounter (HOSPITAL_COMMUNITY): Payer: Self-pay | Admitting: Internal Medicine

## 2017-11-22 DIAGNOSIS — I442 Atrioventricular block, complete: Secondary | ICD-10-CM

## 2017-11-22 LAB — BASIC METABOLIC PANEL
ANION GAP: 9 (ref 5–15)
BUN: 43 mg/dL — ABNORMAL HIGH (ref 6–20)
CALCIUM: 8.7 mg/dL — AB (ref 8.9–10.3)
CO2: 29 mmol/L (ref 22–32)
Chloride: 96 mmol/L — ABNORMAL LOW (ref 101–111)
Creatinine, Ser: 1.65 mg/dL — ABNORMAL HIGH (ref 0.61–1.24)
GFR calc Af Amer: 47 mL/min — ABNORMAL LOW (ref >60)
GFR, EST NON AFRICAN AMERICAN: 40 mL/min — AB (ref >60)
GLUCOSE: 121 mg/dL — AB (ref 65–99)
Potassium: 5.2 mmol/L — ABNORMAL HIGH (ref 3.5–5.1)
SODIUM: 134 mmol/L — AB (ref 135–145)

## 2017-11-22 LAB — PROTIME-INR
INR: 1.74
PROTHROMBIN TIME: 20.2 s — AB (ref 11.4–15.2)

## 2017-11-22 MED ORDER — SODIUM CHLORIDE 0.9% FLUSH
3.0000 mL | Freq: Two times a day (BID) | INTRAVENOUS | Status: DC
  Administered 2017-11-22 – 2017-11-23 (×3): 3 mL via INTRAVENOUS

## 2017-11-22 MED ORDER — POTASSIUM CHLORIDE CRYS ER 20 MEQ PO TBCR
40.0000 meq | EXTENDED_RELEASE_TABLET | Freq: Every day | ORAL | Status: DC
  Administered 2017-11-22: 40 meq via ORAL
  Filled 2017-11-22: qty 2

## 2017-11-22 MED ORDER — FUROSEMIDE 40 MG PO TABS: 40.0000 mg | ORAL_TABLET | Freq: Every day | ORAL | Status: DC

## 2017-11-22 MED ORDER — FUROSEMIDE 40 MG PO TABS
40.0000 mg | ORAL_TABLET | Freq: Every day | ORAL | Status: DC
  Administered 2017-11-23: 40 mg via ORAL
  Filled 2017-11-22: qty 1

## 2017-11-22 NOTE — Progress Notes (Signed)
Progress Note  Patient Name: Brandon [name] Date of Encounter: 11/22/2017  Primary Cardiologist: Aundra Dubin   Subjective   Stable after PPM this morning.   Inpatient Medications    Scheduled Meds: . aspirin EC  81 mg Oral Daily  . bisacodyl  10 mg Oral Daily   Or  . bisacodyl  10 mg Rectal Daily  . chlorhexidine  15 mL Mouth Rinse BID  . Chlorhexidine Gluconate Cloth  6 each Topical Daily  . citalopram  20 mg Oral QHS  . docusate sodium  200 mg Oral Daily  . ferrous sulfate  325 mg Oral Q breakfast  . [START ON 11/23/2017] furosemide  40 mg Oral Daily  . mouth rinse  15 mL Mouth Rinse q12n4p  . pantoprazole  40 mg Oral Daily  . potassium chloride  40 mEq Oral Daily  . sodium chloride flush  10-40 mL Intracatheter Q12H  . traZODone  50 mg Oral QHS  . warfarin  5 mg Oral q1800  . Warfarin - Physician Dosing Inpatient   Does not apply q1800   Continuous Infusions: . sodium chloride 500 mL (11/19/17 0400)  . furosemide (LASIX) infusion 10 mg/hr (11/22/17 0600)  . lactated ringers Stopped (11/17/17 0400)   PRN Meds: acetaminophen, LORazepam, morphine injection, ondansetron (ZOFRAN) IV, ondansetron (ZOFRAN) IV, oxyCODONE, traMADol   Vital Signs    Vitals:   11/22/17 0400 11/22/17 0414 11/22/17 0516 11/22/17 0803  BP: 112/76   (!) 101/52  Pulse: 74 73  60  Resp: 19 11 12 14   Temp:  99.1 F (37.3 C)  98.8 F (37.1 C)  TempSrc:  Oral  Oral  SpO2: 97% 96%  96%  Weight:   238 lb 5.1 oz (108.1 kg)   Height:        Intake/Output Summary (Last 24 hours) at 11/22/2017 0924 Last data filed at 11/22/2017 0600 Gross per 24 hour  Intake 474.83 ml  Output 4250 ml  Net -3775.17 ml   Filed Weights   11/20/17 0540 11/21/17 0654 11/22/17 0516  Weight: 243 lb 2.7 oz (110.3 kg) 236 lb 8.9 oz (107.3 kg) 238 lb 5.1 oz (108.1 kg)    Telemetry    nsr with ventricular pacing - Personally Reviewed  ECG    nsr with ventricular pacing - Personally Reviewed  Physical Exam     GEN: No acute distress.   Neck: 6 cm JVD Cardiac: RRR, no murmurs, rubs, or gallops.  Respiratory: Clear to auscultation bilaterally. GI: Soft, nontender, non-distended  MS: No edema; No deformity. Neuro:  Nonfocal  Psych: Normal affect   Labs    Chemistry Recent Labs  Lab 11/20/17 0411 11/21/17 0430 11/22/17 0316  NA 139 136 134*  K 3.2* 3.8 5.2*  CL 101 98* 96*  CO2 29 29 29   GLUCOSE 84 110* 121*  BUN 41* 44* 43*  CREATININE 1.65* 1.72* 1.65*  CALCIUM 8.5* 8.7* 8.7*  GFRNONAA 40* 38* 40*  GFRAA 47* 44* 47*  ANIONGAP 9 9 9      Hematology Recent Labs  Lab 11/19/17 0353 11/20/17 0411 11/21/17 0430  WBC 11.9* 10.5 10.4  RBC 2.59* 2.94* 3.17*  HGB 8.1* 9.1* 9.8*  HCT 25.1* 27.5* 29.9*  MCV 96.9 93.5 94.3  MCH 31.3 31.0 30.9  MCHC 32.3 33.1 32.8  RDW 14.1 15.1 14.9  PLT 101* 133* 167    Cardiac EnzymesNo results for input(s): TROPONINI in the last 168 hours. No results for input(s): TROPIPOC in the last 168  hours.   BNPNo results for input(s): BNP, PROBNP in the last 168 hours.   DDimer No results for input(s): DDIMER in the last 168 hours.   Radiology    Dg Chest 2 View  Result Date: 11/22/2017 CLINICAL DATA:  Pacemaker placement.  Follow-up. EXAM: CHEST  2 VIEW COMPARISON:  11/20/2017 FINDINGS: Previous median sternotomy, CABG and valve replacement. Chronic cardiomegaly. Dual lead pacemaker with leads in the region of the right atrium and right ventricle. Atrial clip in place. Previously seen internal jugular catheter is removed. Vascularity is normal. Mild bibasilar atelectasis. Small bilateral effusions. IMPRESSION: Pacemaker leads in the region of the right atrium and right ventricle. No pneumothorax. Small effusions and mild bibasilar atelectasis. Electronically Signed   By: Nelson Chimes M.D.   On: 11/22/2017 07:54   Dg Chest Port 1 View  Result Date: 11/20/2017 CLINICAL DATA:  New left PICC line placement. EXAM: PORTABLE CHEST 1 VIEW COMPARISON:   Earlier the same day FINDINGS: 1354 hours. The cardio pericardial silhouette is enlarged. Lung volumes are low. Minimal atelectasis at the bases. No pulmonary edema or focal airspace consolidation. Right IJ catheter tip is stable. Left PICC line tip overlies the distal SVC level near the junction with the RA. Patient is status post CABG, cardiac valve replacement and left atrial appendage excluder device noted. IMPRESSION: 1. PICC line tip overlies the distal SVC level, near the junction with the RA. 2. Cardiomegaly with mild vascular congestion and basilar atelectasis. Electronically Signed   By: Misty Stanley M.D.   On: 11/20/2017 14:22    Cardiac Studies   none  Patient Profile     71 y.o. male admitted for AVR/MVR/MAZE/CABG, with CHB after now s/p PPM.    Assessment & Plan    1. CHB - he is stable after PPM. He has no underlying escape rhythm on exam this morning. 2. PPM - his [employer] DDD PM is working normally. His QRS on 12 lead looks like we are capturing his Right bundle has he has LBBB. Will attempt to reduce his outputs to see if we can more selectively capture the his bundle. On implant his QRS was not as wide as it is today. CXR looks good. Usual followup.  For questions or updates, please contact [place] Please consult [email or web address] for contact info under Cardiology/STEMI.      Signed, Cristopher Peru, MD  11/22/2017, 9:24 AM  Patient ID: Brandon [name], male   DOB: 02/06/1946, 71 y.o.   MRN: [id number]

## 2017-11-22 NOTE — Progress Notes (Signed)
Spoke w/ Ander Purpura, RN to call MD to discuss leaving in PICC until Lasix IV is finished @ 1600 today 12/12. Pending return call from [place] [facility] after discussing plan with MD.  Lerry Liner, RN, VAST

## 2017-11-22 NOTE — Progress Notes (Addendum)
[address]       [place] [address]             [phone]      1 Day Post-Op Procedure(s) (LRB): PACEMAKER IMPLANT (N/A) Subjective: Feels okay this morning.  Objective: Vital signs in last 24 hours: Temp:  [97.7 F (36.5 C)-99.1 F (37.3 C)] 99.1 F (37.3 C) (12/12 0414) Pulse Rate:  [33-136] 73 (12/12 0414) Cardiac Rhythm: Ventricular paced;Bundle branch block (12/11 2000) Resp:  [10-74] 12 (12/12 0516) BP: (0-152)/(0-93) 112/76 (12/12 0400) SpO2:  [0 %-100 %] 96 % (12/12 0414) Weight:  [238 lb 5.1 oz (108.1 kg)] 238 lb 5.1 oz (108.1 kg) (12/12 0516)     Intake/Output from previous day: 12/11 0701 - 12/12 0700 In: 586.6 [P.O.:120; I.V.:316.6; IV Piggyback:150] Out: 4250 [Urine:4250] Intake/Output this shift: No intake/output data recorded.  General appearance: alert, cooperative and no distress Heart: regular rate and rhythm, S1, S2 normal, no murmur, click, rub or gallop Lungs: clear to auscultation bilaterally Abdomen: soft, non-tender; bowel sounds normal; no masses,  no organomegaly Extremities: 1+ pitting pedal edema bilaterally Wound: clean and dry  Lab Results: Recent Labs    11/20/17 0411 11/21/17 0430  WBC 10.5 10.4  HGB 9.1* 9.8*  HCT 27.5* 29.9*  PLT 133* 167   BMET:  Recent Labs    11/21/17 0430 11/22/17 0316  NA 136 134*  K 3.8 5.2*  CL 98* 96*  CO2 29 29  GLUCOSE 110* 121*  BUN 44* 43*  CREATININE 1.72* 1.65*  CALCIUM 8.7* 8.7*    PT/INR:  Recent Labs    11/22/17 0316  LABPROT 20.2*  INR 1.74   ABG    Component Value Date/Time   PHART 7.308 (L) 11/17/2017 0308   HCO3 20.5 11/17/2017 0308   TCO2 22 11/17/2017 1716   ACIDBASEDEF 5.0 (H) 11/17/2017 0308   O2SAT 98.0 11/17/2017 0308   CBG (last 3)  Recent Labs    11/20/17 1849 11/20/17 2110 11/21/17 0743  GLUCAP 99 164* 114*    Assessment/Plan: S/P Procedure(s) (LRB): AORTIC VALVE REPLACEMENT (AVR) with 56mm Magna Ease (N/A) MITRAL  VALVE  REPLACEMENT with 79mm MagnaEase (N/A) CORONARY ARTERY BYPASS GRAFTING (CABG) x 1 (N/A) MAZE (N/A) TRANSESOPHAGEAL ECHOCARDIOGRAM (TEE) (N/A) ARTERIAL LINE INSERTION -RIGHT FEMORAL (Right) ENDOVEIN HARVEST OF GREATER SAPHENOUS VEIN (Right) PPM placement  1. CV-NSR 70s-80s. BP well controlled.  CHB under pacer now s/p PPM. Interrogation this morning. EPW remain in place.  2. Pulm-tolerating room air with good oxygen saturation. CXR appears stable compared to previous study.  3. Renal-making good urine. Tolerating lasix drip at 10mg /hour. Hyperkalemic, will continue 40MEQ daily since he is on a Lasix drip. Creatinine 1.65, stable.  4. H and H stable, expected acute blood loss anemia, platelets trending up 5. Endo-blood glucose level well controlled. A1C is 6.0. 6. Anticoagulation-Continue coumadin 5mg . INR 1.74 7. Chronic Anxiety-on home medication  Plan: EPW remain in place with an INR of 1.74. Continue Lasix drip for fluid overload. Work on ambulation today. Continue to encourage incentive spirometer use.    LOS: 6 days    Brandon [name] 11/22/2017  I have seen and examined the patient and agree with the assessment and plan as outlined.  Making good progress now that PPM in place.  Will stop lasix drip this afternoon and switch to oral.  Possibly ready for d/c home 2-3 days.  Continue to hold ACE-I for now.  Rexene Alberts, MD  11/22/2017 9:02 AM

## 2017-11-22 NOTE — Progress Notes (Signed)
Occupational Therapy Treatment Patient Details Name: Brandon [name] MRN: [id number] DOB: 1946-07-08 Today's Date: 11/22/2017    History of present illness Pt adm for Aortic and Mitral Valve replacements, and CABG. PMH - obesity, cerebrovascular disease status post right hemispheric stroke in 2004, hypertension, aortic stenosis with aortic insufficiency, mitral regurgitation, and recurrent paroxysmal afib. S/p pacemaker placement 12/11.   OT comments  Pt demonstrating progress toward OT goals. Now s/p pacemaker placement and initiated education concerning precautions for L UE accordingly. However, pt will need further reinforcement concerning activity/ROM progression for ADL. He was able to complete ambulating toilet transfers and standing grooming tasks with min guard assist this session. Pt demonstrates improving understanding of sternal precautions. Will continue to follow acutely.    Follow Up Recommendations  Home health OT;Supervision/Assistance - 24 hour    Equipment Recommendations  None recommended by OT    Recommendations for Other Services      Precautions / Restrictions Precautions Precautions: Fall Precaution Comments: chest tubes x2, pacing wires Restrictions Weight Bearing Restrictions: Yes LUE Weight Bearing: Non weight bearing Other Position/Activity Restrictions: sternal precautions; s/p L pacemaker placement       Mobility Bed Mobility Overal bed mobility: Needs Assistance Bed Mobility: Sit to Sidelying;Rolling Rolling: Min guard       Sit to sidelying: Min assist General bed mobility comments: Assist to bring legs up into bed  Transfers Overall transfer level: Needs assistance Equipment used: None Transfers: Sit to/from Stand Sit to Stand: Min guard         General transfer comment: Min guard assist for safety.     Balance Overall balance assessment: Needs assistance Sitting-balance support: No upper extremity supported;Feet  supported Sitting balance-Leahy Scale: Good     Standing balance support: No upper extremity supported;During functional activity Standing balance-Leahy Scale: Fair Standing balance comment: min guard assist for standing balance                           ADL either performed or assessed with clinical judgement   ADL Overall ADL's : Needs assistance/impaired     Grooming: Min guard;Sitting                   Toilet Transfer: Min guard;Ambulation Toilet Transfer Details (indicate cue type and reason): simulated in room         Functional mobility during ADLs: Min guard General ADL Comments: Pt able to verbalize sternal precautions with min verbal cues.      Vision       Perception     Praxis      Cognition Arousal/Alertness: Awake/alert Behavior During Therapy: WFL for tasks assessed/performed Overall Cognitive Status: Within Functional Limits for tasks assessed                                 General Comments: Pt slow to answer questions and very HOH. Wife answers questions for pt frequently. Potentially with some baseline memory deficits and will continue to assess.         Exercises     Shoulder Instructions       General Comments      Pertinent Vitals/ Pain       Pain Assessment: No/denies pain Faces Pain Scale: Hurts little more Pain Location: back Pain Descriptors / Indicators: Grimacing Pain Intervention(s): Limited activity within patient's tolerance;Monitored during session;Repositioned  Home Living  Prior Functioning/Environment              Frequency  Min 2X/week        Progress Toward Goals  OT Goals(current goals can now be found in the care plan section)  Progress towards OT goals: Progressing toward goals  Acute Rehab OT Goals Patient Stated Goal: Hopeful to get home soon and well OT Goal Formulation: With patient/family Time For  Goal Achievement: 12/03/17 Potential to Achieve Goals: Good  Plan Discharge plan remains appropriate    Co-evaluation                 AM-PAC PT "6 Clicks" Daily Activity     Outcome Measure   Help from another person eating meals?: A Little Help from another person taking care of personal grooming?: A Little Help from another person toileting, which includes using toliet, bedpan, or urinal?: A Lot Help from another person bathing (including washing, rinsing, drying)?: A Lot Help from another person to put on and taking off regular upper body clothing?: A Little Help from another person to put on and taking off regular lower body clothing?: A Lot 6 Click Score: 15    End of Session Equipment Utilized During Treatment: Gait belt(L shoulder sling)  OT Visit Diagnosis: Unsteadiness on feet (R26.81)   Activity Tolerance Patient tolerated treatment well   Patient Left in chair;with call bell/phone within reach;with family/visitor present   Nurse Communication Mobility status(family asking about sling)        Time: 1100-3496 OT Time Calculation (min): 30 min  Charges: OT General Charges $OT Visit: 1 Visit OT Treatments $Self Care/Home Management : 23-37 mins  Norman Herrlich, MS OTR/L  Pager: [place] A Derik Fults 11/22/2017, 5:33 PM

## 2017-11-22 NOTE — Progress Notes (Signed)
Epicardial pacing wires removed, per order. Wire ends intact. Sites clean and dry. HR and BP stable. Pt on bedrest x1 hour. Vitals being monitored every 15 minutes, per protocol. Will continue to monitor.  Grant Fontana BSN, RN

## 2017-11-22 NOTE — Progress Notes (Signed)
CARDIAC REHAB PHASE I   PRE:  Rate/Rhythm: 62 paced  BP:  Sitting: 99/59        SaO2: 97 RA  MODE:  Ambulation: 290 ft   POST:  Rate/Rhythm: 95 paced  BP:  Sitting: 118/70         SaO2: 95 RA  Pt required min assist oob, verbal cues for sternal precautions, L arm sling in place. Pt very HOH. Pt ambulated 290 ft on RA, IV, gait belt, hand held assist x2, steady gait, tolerated well with no complaints. Encouraged IS, additional ambulation today. Pt to recliner after walk, feet elevated, call bell within reach. Will follow. Can be x1.  [phone] Lenna Sciara, RN, BSN 11/22/2017 9:51 AM

## 2017-11-22 NOTE — Care Management Important Message (Signed)
Important Message  Patient Details  Name: Brandon [name] MRN: [id number] Date of Birth: Aug 07, 1946   Medicare Important Message Given:  Yes    Brandon [name] 11/22/2017, 1:38 PM

## 2017-11-22 NOTE — Discharge Summary (Signed)
Physician Discharge Summary  Patient ID: IRIS TATSCH MRN: [id number] DOB/AGE: Apr 16, 1946 71 y.o.  Admit date: 11/16/2017 Discharge date: 11/24/2017  Admission Diagnoses: Patient Active Problem List   Diagnosis Date Noted  . Coronary artery disease involving native coronary artery of native heart without angina pectoris   . Paroxysmal atrial fibrillation (HCC)   . Acute on chronic diastolic (congestive) heart failure ([place])   . Dysphagia, post-stroke   . Stage 3 chronic kidney disease ([place])   . History of CVA (cerebrovascular accident)   . Chronic low back pain   . Acute respiratory failure with hypoxemia ([place])   . Acute pulmonary edema (HCC)   . Acute on chronic diastolic CHF (congestive heart failure) ([place])   . Arrhythmia 07/13/2017  . Aortic insufficiency with aortic stenosis 07/13/2017  . Respiratory distress 07/13/2017  . Mitral regurgitation   . Acute cholecystitis with chronic cholecystitis 06/01/2013  . Essential hypertension 06/25/2009  . Cardiovascular disease 06/25/2009  . COPD exacerbation ([place]) 06/25/2009    Discharge Diagnoses:  Principal Problem:   S/P aortic + mitral valve replacement with bioprosthetic valves + CABG x1 + maze procedure Active Problems:   MITRAL INSUFFICIENCY   Essential hypertension   Aortic valve disorder   Mitral regurgitation   Aortic insufficiency with aortic stenosis   Acute on chronic diastolic (congestive) heart failure (HCC)   Dysphagia, post-stroke   Stage 3 chronic kidney disease (HCC)   Paroxysmal atrial fibrillation (HCC)   Coronary artery disease involving native coronary artery of native heart without angina pectoris   S/P mitral valve replacement with bioprosthetic valve   S/P CABG x 1   S/P Maze operation for atrial fibrillation   Discharged Condition: good  HPI:  Patient is a 71 year old moderately obese white male with history of cerebrovascular disease status post right hemispheric stroke in 2004, hypertension,  aortic stenosis with aortic insufficiency, mitral regurgitation, and recurrent paroxysmal atrial fibrillation who has been referred for surgical consultation to discuss treatment options for management of his valvular heart disease and atrial fibrillation. The patient suffered a stroke in 2004 involving the right middle cerebral artery that was treated with catheter directed thrombo-lysis with remarkable success. He initially presented with severe left-sided paralysis, but he recovered from the stroke quite well with some mild residual problems with short-term memory loss. At the time he was told that there were signs to suggest that he may have had a heart attack in the past and he was referred for cardiology consultation. He has been followed intermittently ever since by Dr. Harrington Challenger. He was noted to have a heart murmur on exam and echocardiograms revealed the presence of aortic stenosis with aortic insufficiency, mitral regurgitation, and mild left ventricular systolic dysfunction. He had remained clinically stable until last spring when he was scheduled for left shoulder surgery. Preoperative cardiac clearance was requested and both transthoracic and transesophageal echocardiograms were performed. Echocardiograms revealed normal left ventricular systolic function with mild to moderate aortic stenosis, moderate aortic insufficiency, and moderate mitral regurgitation. The patient underwent uncomplicated left shoulder reconstruction. During his immediate postoperative recovery the patient states that he experienced a 30 minute period of tachypalpitations associated with severe shortness of breath and chest discomfort. Symptoms resolved and did not recur. Over the next 2 months the patient did fairly well and went to physical rehabilitation for treatment of his shoulder surgery. During that period of time he was noted to experience exertional shortness of breath, and he was admitted acutely in August with severe  resting shortness breath and chest pressure associated with atrial fibrillation and rapid ventricular response. He developed acute hypoxemic respiratory failure requiring intubation for mechanical ventilatory support. He became hypotensive and required pressors for a period of time. He developed acute renal failure and pneumonia. He had a prolonged hospitalization but eventually recovered and was extubated. Because of his severe deconditioning he was discharged to the inpatient rehabilitation service for 2 weeks. He continues to gradually improve and was seen in follow-up by Dr. Aundra Dubin in the advanced heart failure clinic. He has been maintaining sinus rhythm on amiodarone and is anticoagulated using Eliquis.Transesophageal echocardiogram and diagnostic cardiac catheterization were performed 08/30/2017. TEE revealed mildtomoderate aortic stenosis with moderate to severe aortic insufficiency and severe mitral regurgitation. There was mild left ventricular chamber enlargement with ejection fraction estimated 50%. Diagnostic cardiac catheterization revealed a normal as coronary circulation with chronic occlusion of the right coronary artery and posterior descending coronary artery with left-to-right collaterals. There was no significant disease in the left coronary system. Mean transvalvular gradient across the aortic valve was 23 mmHg. Pulmonary artery pressures were very mildly elevated. Patient was referred for surgical consultation.  The patient is married and lives with his wife in [place]. 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. He recovered from his stroke remarkably well and lives a fairly normal physical lifestyle up until recently. He states that he was doing well with his physical therapy and recovery from his shoulder surgery although he developed progressive shortness of breath followed by the acute  onset of severe shortness of breath and chest discomfort with acute hypoxemic respiratory failure in the setting of atrial fibrillation with rapid ventricular response. Since his prolonged hospitalization the patient has been now at home for approximately one month. He is making good progress but states that he still not back to his baseline. His gait remains slightly unstable because of weakness and problems with balance. His breathing is stable although he does get short of breath with more strenuous exertion. He has not had resting shortness of breath, PND, orthopnea, palpitations, or lower extremity edema since hospital discharge. Appetite is stable. Strength is slowly improving. Early after his hospitalization he had problems with dysphagia but this has resolved. During his acute illness he had problems with memory loss and delivery and but this has resolved.  Patient returns the office today for follow-up of aortic stenosis with aortic insufficiency, mitral regurgitation, recurrent paroxysmal atrial fibrillation, and coronary artery disease with tentative plans to proceed with surgery later this week. He was originally seen in consultation on September 15, 2017 and he was seen more recently on October 09, 2017. He reports no new problems or complaints over the past month. In fact, he has continued to make steady improvement in both he and his wife state that his exercise tolerance is now essentially back to how it was prior to his hospitalization last August. He stopped taking Eliquisin anticipation of surgery as previously planned. He was examined by his dentist and plans to go extraction of a single loose tooth tomorrow. He is looking forward to getting his surgery behind him.   Hospital Course:  On 11/16/2017 Mr. Leo Rod underwent a aortic valve replacement, mitral valve replacement, coronary artery bypass grafting x1, and maze procedure with Dr. Roxy Manns.  He tolerated the procedure well and was  transferred to the ICU for continued care.  He was extubated in a timely manner.  Postop  day 1 he was overall doing well.  He was maintaining a junctional rhythm with stable heart rate and hemodynamics on low-dose neo-and Levophed for blood pressure support.  He was breathing comfortably on 4 L/min.  He did have some expected postop volume excess.  We discontinued his lines at this time and started Coumadin for valve thrombus prophylaxis.  We held diuretics at this time due to requiring pressor support.  Postop day 2 he remained hemodynamically stable on low-dose dopamine.  He remained AV paced at 80 and continued to have a junctional rhythm in the 70s under the pacer.  He was placed on a Lasix drip for fluid overload.  We continued his chest tube due to increased output.  On postop day 3 was able to remove the chest tube.  Coumadin was increased at this time due to a low INR.  Postop day 4 we worked on mobilization.  We continued pacing and mode DDD.  We will continue low-dose dopamine and Lasix gtt. he was started on his home medications for chronic anxiety.  He was started on low-dose Lovenox until his INR increases.  We inserted a PICC line and discontinued his old sleeve.  We supplemented potassium aggressively due to hypokalemia.  Postop day 5 he continues to slowly progress.  He was in complete heart block under the pacer, therefore we consulted electrophysiology for permanent pacemaker placement we continue to supplement potassium when appropriate.  We continued the Lasix drip for fluid overload.  He remained approximately 4 pounds over preop weight.  He was transferred to the stepdown unit for continued care at this time.  Postop day 6 he continued to progress.  He is tolerating the Lasix drip and diuresing appropriately.  He is hyperkalemic today, but we will continue low-dose potassium replacement due to the Lasix drip.  The pacemaker was implanted and interrogated this morning.  We discontinued  epicardial pacing wires at this time.  We continued Coumadin at 5 mg and INR was trending an upward direction.  We continue to encourage incentive spirometer use. He continued to progress on the floor. Today, he is ambulating with limited assistance, tolerating room air, his incisions are healing well and he is ready for discharge.    Consults: cardiology  Significant Diagnostic Studies:   CLINICAL DATA:  Pacemaker placement.  Follow-up.  EXAM: CHEST  2 VIEW  COMPARISON:  11/20/2017  FINDINGS: Previous median sternotomy, CABG and valve replacement. Chronic cardiomegaly. Dual lead pacemaker with leads in the region of the right atrium and right ventricle. Atrial clip in place. Previously seen internal jugular catheter is removed. Vascularity is normal. Mild bibasilar atelectasis. Small bilateral effusions.  IMPRESSION: Pacemaker leads in the region of the right atrium and right ventricle. No pneumothorax. Small effusions and mild bibasilar atelectasis.   Electronically Signed   By: Nelson Chimes M.D.   On: 11/22/2017 07:54  Treatments:   CARDIOTHORACIC SURGERY OPERATIVE NOTE  Date of Procedure:                11/16/2017  Preoperative Diagnosis:        Moderate Aortic Stenosis and Aortic Regurgitation  Severe Mitral Regurgitation  Severe Single Vessel Coronary Artery Disease  Recurrent Paroxysmal Atrial Fibrillation  Postoperative Diagnosis:    Same  Procedure:       Aortic Valve Replacement              Edwards Magna Ease Bovine Bioprosthetic Tissue Valve (size 23 mm, model #3300TFX, serial #[id number])  Mitral Valve Replacement              Edwards Magna Mitral Bovine Bioprosthetic Tissue Valve (size 27 mm, model #7300TFX, serial #[id number])   Coronary Artery Bypass Grafting x 1             Saphenous Vein Graft to Posterior Descending Coronary Artery             Endoscopic Vein Harvest from Right Thigh   Maze Procedure              Left atrial  lesion set using bipolar radiofrequency and cryothermy ablation             clipping of left atrial appendage (Atriclip size 59mm)               Surgeon:        Valentina Gu. Roxy Manns, MD  Assistant:       Nicholes Rough, PA-C  Anesthesia:    Lillia Abed, MD  Operative Findings: ? Rheumatic aortic and mitral valve disease ? Moderate-severe aortic stenosis ? Moderate aortic Insufficiency ? Type IIIA mitral valve dysfunction with severe mitral regurgitation ? Dilated left ventricle with normal systolic function ? Moderate LV hypertrophy ? Normal RV size and systolic function ? Severe atherosclerosis with patchy calcification of ascending thoracic aorta    Discharge Exam: Blood pressure 116/79, pulse 99, temperature 97.7 F (36.5 C), temperature source Oral, resp. rate (!) 22, height 5\' 9"  (1.753 m), weight 238 lb 12.1 oz (108.3 kg), SpO2 (!) 85 %.   General appearance: alert, cooperative and no distress Heart: regular rate and rhythm, S1, S2 normal, no murmur, click, rub or gallop Lungs: clear to auscultation bilaterally Abdomen: soft, non-tender; bowel sounds normal; no masses,  no organomegaly Extremities: extremities normal, atraumatic, no cyanosis or edema Wound: clean and dry    Disposition: 01-Home or Self Care  Discharge Instructions    Amb Referral to Cardiac Rehabilitation   Complete by:  As directed    Diagnosis:   CABG Valve Replacement     Valve:   Mitral Aortic     CABG X ___:  1     Allergies as of 11/24/2017      Reactions   Bee Venom Anaphylaxis, Swelling      Medication List    STOP taking these medications   amiodarone 200 MG tablet Commonly known as:  PACERONE   apixaban 5 MG Tabs tablet Commonly known as:  ELIQUIS   lisinopril 10 MG tablet Commonly known as:  PRINIVIL,ZESTRIL   nitroGLYCERIN 0.4 MG SL tablet Commonly known as:  NITROSTAT   oxyCODONE-acetaminophen 5-325 MG tablet Commonly known as:  ROXICET     TAKE these  medications   acetaminophen 325 MG tablet Commonly known as:  TYLENOL Take 1 tablet (325 mg total) by mouth every 6 (six) hours as needed for mild pain.   aspirin 81 MG EC tablet Take 1 tablet (81 mg total) by mouth daily.   calcium carbonate 500 MG chewable tablet Commonly known as:  TUMS - dosed in mg elemental calcium Chew 1 tablet by mouth daily as needed for indigestion or heartburn.   cetirizine 10 MG tablet Commonly known as:  ZYRTEC Take 10 mg by mouth daily as needed for allergies.   citalopram 20 MG tablet Commonly known as:  CELEXA Take 1 tablet (20 mg total) by mouth at bedtime.   docusate sodium 100 MG capsule Commonly known as:  COLACE Take 1 capsule (100  mg total) by mouth 3 (three) times daily as needed.   ferrous sulfate 325 (65 FE) MG tablet Take 1 tablet (325 mg total) by mouth daily with breakfast.   furosemide 40 MG tablet Commonly known as:  LASIX Please take 1 tab (40mg ) twice a day for 2 weeks then take 1 tab (40mg ) once a day until follow-up. What changed:    how much to take  how to take this  when to take this  additional instructions   LORazepam 0.5 MG tablet Commonly known as:  ATIVAN Take 1 tablet (0.5 mg total) by mouth every 6 (six) hours as needed for anxiety (aggitation).   lovastatin 40 MG tablet Commonly known as:  MEVACOR Take 1 tablet (40 mg total) by mouth at bedtime.   oxyCODONE 5 MG immediate release tablet Commonly known as:  Oxy IR/ROXICODONE Take 1 tablet (5 mg total) by mouth every 4 (four) hours as needed for severe pain.   pantoprazole 40 MG tablet Commonly known as:  PROTONIX Take 1 tablet (40 mg total) by mouth daily. What changed:    medication strength  how much to take   potassium chloride SA 20 MEQ tablet Commonly known as:  K-DUR,KLOR-CON Please take one tab (20 MEQ) twice a day for 2 weeks then take one tab [facility]) once a day until follow-up. What changed:    how much to take  how to take  this  when to take this  additional instructions   traZODone 50 MG tablet Commonly known as:  DESYREL Take 1 tablet (50 mg total) by mouth at bedtime.   warfarin 5 MG tablet Commonly known as:  COUMADIN Take 1 tablet (5 mg total) by mouth daily at 6 PM.   ZOFRAN 4 MG tablet Generic drug:  ondansetron Take 4 mg by mouth every 8 (eight) hours as needed for nausea or vomiting.      Follow-up Information    [place] [facility] Follow up on 12/07/2017.   Specialty:  Cardiology Why:  at [facility] information: [address] [place]       Evans Lance, MD Follow up on 02/27/2018.   Specialty:  Cardiology Why:  at 11:15AM Contact information: [address] [address] [phone]        Rexene Alberts, MD Follow up.   Specialty:  Cardiothoracic Surgery Why:  Your appointment is on 12/25/2017 at 1:30pm. Please arrive at 1:00pm for a chest xray located at [place] [facility] which is on the first floor of our building.  Contact information: [address] [place] [address] [phone]        Maurice Small, MD. Call in 1 day(s).   Specialty:  Family Medicine Contact information: [place] [address] [phone]        Sherran Needs, NP Follow up.   Specialties:  Nurse Practitioner, Cardiology Why:  This is your atrial fibrillation clinic follow-up appointment on 06/21/18 @ 1:30pm. Please bring your medication list.  Contact information: [place] [address] [phone]        Coumadin clinic Follow up.   Why:  Coumadin Clinic 12/17 @2 :30 pm ([facility])  Contact information: [place] [address] Phone: [phone]         The patient has been discharged on:   1.Beta Blocker:  Yes [   ]  No   [ x  ]                              If No, reason:  CHB  2.Ace Inhibitor/ARB: Yes [   ]                                     No  [  x  ]                                     If No, reason:AKI  3.Statin:   Yes [x   ]                  No  [   ]                  If No, reason:  4.Ecasa:  Yes  [ x  ]                  No   [   ]                  If No, reason:   Signed: Elgie Collard 11/24/2017, 12:48 PM

## 2017-11-23 LAB — PROTIME-INR
INR: 1.83
PROTHROMBIN TIME: 21 s — AB (ref 11.4–15.2)

## 2017-11-23 LAB — POTASSIUM: Potassium: 3.9 mmol/L (ref 3.5–5.1)

## 2017-11-23 MED ORDER — POTASSIUM CHLORIDE CRYS ER 20 MEQ PO TBCR
20.0000 meq | EXTENDED_RELEASE_TABLET | Freq: Every day | ORAL | Status: DC
  Administered 2017-11-23: 20 meq via ORAL
  Filled 2017-11-23: qty 1

## 2017-11-23 NOTE — Progress Notes (Signed)
Physical Therapy Treatment Patient Details Name: Brandon [name] MRN: [id number] DOB: Dec 26, 1945 Today's Date: 11/23/2017    History of Present Illness Pt adm for Aortic and Mitral Valve replacements, and CABG. PMH - obesity, cerebrovascular disease status post right hemispheric stroke in 2004, hypertension, aortic stenosis with aortic insufficiency, mitral regurgitation, and recurrent paroxysmal afib. S/p pacemaker placement 12/11.    PT Comments    Pt performed gait without AD and minor balance check but no Overt LOB.  Pt performed stair training with R rail.  Cues throughout session to maintain sternal precautions.  Plan next session for stair training without railing to simulate home environment.   Follow Up Recommendations  Home health PT;Supervision/Assistance - 24 hour     Equipment Recommendations  None recommended by PT    Recommendations for Other Services       Precautions / Restrictions Precautions Precautions: Fall;Sternal;ICD/Pacemaker Restrictions Weight Bearing Restrictions: Yes(sternal precautions and ICD precautions) Other Position/Activity Restrictions: sternal precautions; s/p L pacemaker placement    Mobility  Bed Mobility Overal bed mobility: Needs Assistance Bed Mobility: Supine to Sit;Sit to Supine     Supine to sit: Min assist Sit to supine: Supervision   General bed mobility comments: Pt required multiple reps of bed mobility to perform while maintaining sternal precautions.  During second attempt utilized heart pillow to avoid reaching and pulling on rail.  Pt educated on sternal and ICD precautions to maintain safety.    Transfers Overall transfer level: Needs assistance Equipment used: None Transfers: Sit to/from Stand Sit to Stand: Min guard         General transfer comment: Min guard assist for safety. Emphasis on precautions.    Ambulation/Gait Ambulation/Gait assistance: Supervision Ambulation Distance (Feet): 150 Feet Assistive  device: None Gait Pattern/deviations: Step-through pattern;Decreased stride length Gait velocity: Steady speed   General Gait Details: Assist for safety   Stairs Stairs: Yes   Stair Management: One rail Right Number of Stairs: 5 General stair comments: Cues for sequencing and use of rail safely.  Plan next session for stair training without rail.   Wheelchair Mobility    Modified Rankin (Stroke Patients Only)       Balance Overall balance assessment: Needs assistance   Sitting balance-Leahy Scale: Good       Standing balance-Leahy Scale: Fair                              Cognition Arousal/Alertness: Awake/alert Behavior During Therapy: WFL for tasks assessed/performed Overall Cognitive Status: Within Functional Limits for tasks assessed                                 General Comments: Pt slow to answer questions and very HOH. Wife answers questions for pt frequently. Potentially with some baseline memory deficits and will continue to assess.       Exercises      General Comments        Pertinent Vitals/Pain Pain Assessment: 0-10 Pain Score: 3  Pain Location: back Pain Descriptors / Indicators: Grimacing Pain Intervention(s): Monitored during session;Repositioned    Home Living                      Prior Function            PT Goals (current goals can now be found in the care plan section) Acute  Rehab PT Goals Patient Stated Goal: Hopeful to get home soon and well Progress towards PT goals: Progressing toward goals    Frequency    Min 3X/week      PT Plan Current plan remains appropriate    Co-evaluation              AM-PAC PT "6 Clicks" Daily Activity  Outcome Measure  Difficulty turning over in bed (including adjusting bedclothes, sheets and blankets)?: Unable Difficulty moving from lying on back to sitting on the side of the bed? : Unable Difficulty sitting down on and standing up from a  chair with arms (e.g., wheelchair, bedside commode, etc,.)?: Unable Help needed moving to and from a bed to chair (including a wheelchair)?: A Little Help needed walking in hospital room?: A Little Help needed climbing 3-5 steps with a railing? : A Little 6 Click Score: 12    End of Session   Activity Tolerance: Patient tolerated treatment well Patient left: with call bell/phone within reach(using toilet with wife to assist post session.  ) Nurse Communication: Mobility status PT Visit Diagnosis: Other abnormalities of gait and mobility (R26.89);Muscle weakness (generalized) (M62.81)     Time: 0211-1735 PT Time Calculation (min) (ACUTE ONLY): 12 min  Charges:  $Gait Training: 8-22 mins                    G Codes:       Governor Rooks, PTA pager [phone]    Cristela Blue 11/23/2017, 6:23 PM

## 2017-11-23 NOTE — Progress Notes (Addendum)
      [address]       [place] [address]             [phone]      2 Days Post-Op Procedure(s) (LRB): PACEMAKER IMPLANT (N/A) Subjective: No issues overnight. He has been walking in the halls with limited assistance.   Objective: Vital signs in last 24 hours: Temp:  [97.4 F (36.3 C)-99 F (37.2 C)] 98 F (36.7 C) (12/13 0431) Pulse Rate:  [60-108] 62 (12/13 0431) Cardiac Rhythm: Ventricular paced (12/13 0700) Resp:  [12-23] 12 (12/13 0431) BP: (94-128)/(35-97) 121/65 (12/13 0431) SpO2:  [96 %] 96 % (12/12 0803) Weight:  [235 lb 9.6 oz (106.9 kg)] 235 lb 9.6 oz (106.9 kg) (12/13 0431)     Intake/Output from previous day: 12/12 0701 - 12/13 0700 In: 380 [P.O.:290; I.V.:90] Out: 150 [Urine:150] Intake/Output this shift: No intake/output data recorded.  General appearance: alert, cooperative and no distress Heart: regular rate and rhythm, S1, S2 normal, no murmur, click, rub or gallop Lungs: clear to auscultation bilaterally Abdomen: soft, non-tender; bowel sounds normal; no masses,  no organomegaly Extremities: extremities normal, atraumatic, no cyanosis or edema Wound: clean and dry  Lab Results: Recent Labs    11/21/17 0430  WBC 10.4  HGB 9.8*  HCT 29.9*  PLT 167   BMET:  Recent Labs    11/21/17 0430 11/22/17 0316  NA 136 134*  K 3.8 5.2*  CL 98* 96*  CO2 29 29  GLUCOSE 110* 121*  BUN 44* 43*  CREATININE 1.72* 1.65*  CALCIUM 8.7* 8.7*    PT/INR:  Recent Labs    11/23/17 0327  LABPROT 21.0*  INR 1.83   ABG    Component Value Date/Time   PHART 7.308 (L) 11/17/2017 0308   HCO3 20.5 11/17/2017 0308   TCO2 22 11/17/2017 1716   ACIDBASEDEF 5.0 (H) 11/17/2017 0308   O2SAT 98.0 11/17/2017 0308   CBG (last 3)  Recent Labs    11/20/17 1849 11/20/17 2110 11/21/17 0743  GLUCAP 99 164* 114*    Assessment/Plan: S/P  Procedure(s) (LRB): PACEMAKER IMPLANT (N/A) AORTIC VALVE REPLACEMENT (AVR) with 70mm Magna Ease  (N/A) MITRAL VALVE REPLACEMENT with 62mm MagnaEase (N/A) CORONARY ARTERY BYPASS GRAFTING (CABG) x 1 (N/A) MAZE (N/A) TRANSESOPHAGEAL ECHOCARDIOGRAM (TEE) (N/A) ARTERIAL LINE INSERTION -RIGHT FEMORAL (Right) ENDOVEIN HARVEST OF GREATER SAPHENOUS VEIN (Right) PPM placement  1. CV-NSR 60s-70s. BP well controlled.  CHB under pacer now s/p PPM. Interrogation yesterday. EPW removed without issue.  2. Pulm-tolerating room air with good oxygen saturation. CXR appears stable compared to previous study.  3. Renal-making good urine. Tolerating lasix 40mg  PO daily. Weight continues to trend down.  Hyperkalemic yesterday, will continue 20MEQ daily since he remains on loop diuretics. Will order a potassium level. Creatinine 1.65, stable.  4. H and H stable, expected acute blood loss anemia, platelets trending up 5. Endo-blood glucose level well controlled. A1C is 6.0. 6. Anticoagulation-Continue coumadin 5mg . INR 1.83 7. Chronic Anxiety-on home medication  Plan: Continue diuretics with oral lasix. Continue to ambulate TID. Encourage incentive spirometer. Hopefully home tomorrow. Follow-up on Potassium level.     LOS: 7 days    Elgie Collard 11/23/2017  I have seen and examined the patient and agree with the assessment and plan as outlined.  Rexene Alberts, MD 11/23/2017 8:25 AM

## 2017-11-23 NOTE — Plan of Care (Signed)
  Education: Ability to demonstrate proper wound care will improve 11/23/2017 2307 - Progressing by Drenda Freeze, RN   Education: Knowledge of disease or condition will improve 11/23/2017 2307 - Progressing by Drenda Freeze, RN   Education: Knowledge of the prescribed therapeutic regimen will improve 11/23/2017 2307 - Progressing by Drenda Freeze, RN

## 2017-11-23 NOTE — Progress Notes (Signed)
CARDIAC REHAB PHASE I  Pt ambulating independently with family, no complaints. Cardiac surgery discharge education completed with pt and wife at bedside. Reviewed IS, sternal precautions, activity progression, exercise, heart healthy diet, daily weights, s/s chf and phase 2 cardiac rehab. Pt and verbalized understanding. Pt agrees to phase 2 cardiac rehab referral, will send to [facility] per pt request. Pt in recliner, call bell within reach. Will sign off as pt is ambulating independently, hopeful for discharge tomorrow.    [id number] Lenna Sciara, RN, BSN 11/23/2017 10:42 AM

## 2017-11-24 LAB — BASIC METABOLIC PANEL
Anion gap: 10 (ref 5–15)
BUN: 38 mg/dL — ABNORMAL HIGH (ref 6–20)
CO2: 27 mmol/L (ref 22–32)
Calcium: 8.5 mg/dL — ABNORMAL LOW (ref 8.9–10.3)
Chloride: 98 mmol/L — ABNORMAL LOW (ref 101–111)
Creatinine, Ser: 1.56 mg/dL — ABNORMAL HIGH (ref 0.61–1.24)
GFR calc Af Amer: 50 mL/min — ABNORMAL LOW (ref >60)
GFR calc non Af Amer: 43 mL/min — ABNORMAL LOW (ref >60)
Glucose, Bld: 115 mg/dL — ABNORMAL HIGH (ref 65–99)
Potassium: 3.8 mmol/L (ref 3.5–5.1)
Sodium: 135 mmol/L (ref 135–145)

## 2017-11-24 LAB — PROTIME-INR
INR: 2.08
PROTHROMBIN TIME: 23.2 s — AB (ref 11.4–15.2)

## 2017-11-24 MED ORDER — WARFARIN SODIUM 5 MG PO TABS: 5.0000 mg | ORAL_TABLET | Freq: Every day | ORAL | 1 refills | Status: DC

## 2017-11-24 MED ORDER — FUROSEMIDE 40 MG PO TABS: 40.0000 mg | ORAL_TABLET | Freq: Two times a day (BID) | ORAL | Status: DC

## 2017-11-24 MED ORDER — ASPIRIN 81 MG PO TBEC: 81.0000 mg | DELAYED_RELEASE_TABLET | Freq: Every day | ORAL | 0 refills | Status: DC

## 2017-11-24 MED ORDER — POTASSIUM CHLORIDE CRYS ER 20 MEQ PO TBCR: EXTENDED_RELEASE_TABLET | ORAL | 1 refills | Status: DC

## 2017-11-24 MED ORDER — ACETAMINOPHEN 325 MG PO TABS: 325.0000 mg | ORAL_TABLET | Freq: Four times a day (QID) | ORAL | 0 refills | Status: DC | PRN

## 2017-11-24 MED ORDER — OXYCODONE HCL 5 MG PO TABS: 5.0000 mg | ORAL_TABLET | ORAL | 0 refills | Status: DC | PRN

## 2017-11-24 MED ORDER — POTASSIUM CHLORIDE CRYS ER 20 MEQ PO TBCR
20.0000 meq | EXTENDED_RELEASE_TABLET | Freq: Two times a day (BID) | ORAL | Status: DC
  Administered 2017-11-24: 20 meq via ORAL
  Filled 2017-11-24: qty 1

## 2017-11-24 MED ORDER — PANTOPRAZOLE SODIUM 40 MG PO TBEC: 40.0000 mg | DELAYED_RELEASE_TABLET | Freq: Every day | ORAL | 1 refills | Status: DC

## 2017-11-24 MED ORDER — FUROSEMIDE 40 MG PO TABS: ORAL_TABLET | ORAL | 1 refills | Status: DC

## 2017-11-24 NOTE — Progress Notes (Signed)
Pt discharging home with wife.  All instructions and prescriptions given.  Follow up appointments in place and home health arranged.  All questions answered.

## 2017-11-24 NOTE — Care Management Note (Signed)
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                 Action/Plan: NCM will follow for dc needs.   Expected Discharge Date:  11/24/17               Expected Discharge Plan:  [place]  In-House Referral:  NA  Discharge planning Services  CM Consult  Post Acute Care Choice:  Home Health Choice offered to:  Patient, Spouse  DME Arranged:   n/a DME Agency:   n/a  HH Arranged:  PT [place] Agency:  [facility]  Status of Service:  Completed, signed off  If discussed at [place] of Stay Meetings, dates discussed:    Additional Comments: Pt for discharge today. Orders placed for HH. Discussed orders for PT/OT.   In to speak with Pt and spouse at bedside, offered choice for [facility].  Pt and spouse selected [facility].   Referral called to Butch Penny at [facility] regarding PT/OT. Kristen Cardinal, RN 11/24/2017, 1:11 PM

## 2017-11-24 NOTE — Progress Notes (Signed)
      [address]       [place] [address]             [phone]      3 Days Post-Op Procedure(s) (LRB): PACEMAKER IMPLANT (N/A) Subjective: Feels good today and ready for home.  Objective: Vital signs in last 24 hours: Temp:  [97.7 F (36.5 C)-98 F (36.7 C)] 97.7 F (36.5 C) (12/14 0505) Pulse Rate:  [60-99] 99 (12/14 0505) Cardiac Rhythm: Ventricular paced (12/14 0700) Resp:  [15-22] 22 (12/14 0505) BP: (108-166)/(62-79) 116/79 (12/14 0505) SpO2:  [85 %-100 %] 85 % (12/14 0505) Weight:  [238 lb 12.1 oz (108.3 kg)] 238 lb 12.1 oz (108.3 kg) (12/14 0505)     Intake/Output from previous day: 12/13 0701 - 12/14 0700 In: 40 [P.O.:40] Out: -  Intake/Output this shift: No intake/output data recorded.  General appearance: alert, cooperative and no distress Heart: regular rate and rhythm, S1, S2 normal, no murmur, click, rub or gallop Lungs: clear to auscultation bilaterally Abdomen: soft, non-tender; bowel sounds normal; no masses,  no organomegaly Extremities: extremities normal, atraumatic, no cyanosis or edema Wound: clean and dry  Lab Results: No results for input(s): WBC, HGB, HCT, PLT in the last 72 hours. BMET:  Recent Labs    11/22/17 0316 11/23/17 0946  NA 134*  --   K 5.2* 3.9  CL 96*  --   CO2 29  --   GLUCOSE 121*  --   BUN 43*  --   CREATININE 1.65*  --   CALCIUM 8.7*  --     PT/INR:  Recent Labs    11/24/17 0311  LABPROT 23.2*  INR 2.08   ABG    Component Value Date/Time   PHART 7.308 (L) 11/17/2017 0308   HCO3 20.5 11/17/2017 0308   TCO2 22 11/17/2017 1716   ACIDBASEDEF 5.0 (H) 11/17/2017 0308   O2SAT 98.0 11/17/2017 0308   CBG (last 3)  Recent Labs    11/21/17 0743  GLUCAP 114*    Assessment/Plan: S/P Procedure(s) (LRB): PACEMAKER IMPLANT (N/A) AORTIC VALVE REPLACEMENT (AVR) with 56mm Magna Ease (N/A) MITRAL VALVE REPLACEMENT with 25mm MagnaEase (N/A) CORONARY ARTERY BYPASS GRAFTING (CABG) x 1  (N/A) MAZE (N/A) TRANSESOPHAGEAL ECHOCARDIOGRAM (TEE) (N/A) ARTERIAL LINE INSERTION -RIGHT FEMORAL (Right) ENDOVEIN HARVEST OF GREATER SAPHENOUS VEIN (Right) PPM placement  1. CV-NSR 70s. BP well controlled. PPM in place. EPW removed without issue.  2. Pulm-tolerating room air with good oxygen saturation. Encourage incentive spirometer use.   3. Renal-daily weight not taken today. Tolerating lasix 40mg  PO daily.  Repeat potassium 3.9. Creatinine 1.65, stable.  4. H and H stable, expected acute blood loss anemia, platelets trending up 5. Endo-blood glucose level well controlled. A1C is 6.0. 6. Anticoagulation-Continue coumadin 5mg . INR 2.08. Arranging INR draw for Monday 12/17.  7. Chronic Anxiety-on home medication  Plan: home with family today.    LOS: 8 days    Elgie Collard 11/24/2017

## 2017-11-27 ENCOUNTER — Other Ambulatory Visit (HOSPITAL_COMMUNITY): Payer: Medicare HMO

## 2017-11-27 ENCOUNTER — Other Ambulatory Visit (HOSPITAL_COMMUNITY): Payer: Self-pay | Admitting: Cardiology

## 2017-11-27 ENCOUNTER — Ambulatory Visit (INDEPENDENT_AMBULATORY_CARE_PROVIDER_SITE_OTHER): Payer: Medicare HMO | Admitting: *Deleted

## 2017-11-27 DIAGNOSIS — I080 Rheumatic disorders of both mitral and aortic valves: Secondary | ICD-10-CM

## 2017-11-27 DIAGNOSIS — Z8679 Personal history of other diseases of the circulatory system: Secondary | ICD-10-CM

## 2017-11-27 DIAGNOSIS — Z5181 Encounter for therapeutic drug level monitoring: Secondary | ICD-10-CM | POA: Insufficient documentation

## 2017-11-27 DIAGNOSIS — Z9889 Other specified postprocedural states: Secondary | ICD-10-CM | POA: Diagnosis not present

## 2017-11-27 DIAGNOSIS — Z953 Presence of xenogenic heart valve: Secondary | ICD-10-CM

## 2017-11-27 DIAGNOSIS — I480 Paroxysmal atrial fibrillation: Secondary | ICD-10-CM

## 2017-11-27 DIAGNOSIS — Z951 Presence of aortocoronary bypass graft: Secondary | ICD-10-CM | POA: Diagnosis not present

## 2017-11-27 LAB — POCT INR: INR: 3.5

## 2017-11-27 NOTE — Patient Instructions (Addendum)
A full discussion of the nature of anticoagulants has been carried out.  A benefit risk analysis has been presented to the patient, so that they understand the justification for choosing anticoagulation at this time. The need for frequent and regular monitoring, precise dosage adjustment and compliance is stressed.  Side effects of potential bleeding are discussed.  The patient should avoid any OTC items containing aspirin or ibuprofen, and should avoid great swings in general diet.  Avoid alcohol consumption.  Call if any signs of abnormal bleeding.                                                                                                                                                                                                                                                    Description   Do not take coumadin today Dec 17th then change dose to 1 tablet (5mg ) daily except 1/2 tablet(2.5mg ) on Mondays and Fridays Recheck INR in 1 week Call with any new medications scheduled procedures or any questions  [phone]

## 2017-11-28 DIAGNOSIS — Z952 Presence of prosthetic heart valve: Secondary | ICD-10-CM | POA: Diagnosis not present

## 2017-11-28 DIAGNOSIS — I130 Hypertensive heart and chronic kidney disease with heart failure and stage 1 through stage 4 chronic kidney disease, or unspecified chronic kidney disease: Secondary | ICD-10-CM | POA: Diagnosis not present

## 2017-11-28 DIAGNOSIS — I700 Atherosclerosis of aorta: Secondary | ICD-10-CM | POA: Diagnosis not present

## 2017-11-28 DIAGNOSIS — I5032 Chronic diastolic (congestive) heart failure: Secondary | ICD-10-CM | POA: Diagnosis not present

## 2017-11-28 DIAGNOSIS — I2510 Atherosclerotic heart disease of native coronary artery without angina pectoris: Secondary | ICD-10-CM | POA: Diagnosis not present

## 2017-11-28 DIAGNOSIS — Z48812 Encounter for surgical aftercare following surgery on the circulatory system: Secondary | ICD-10-CM | POA: Diagnosis not present

## 2017-11-28 DIAGNOSIS — Z951 Presence of aortocoronary bypass graft: Secondary | ICD-10-CM | POA: Diagnosis not present

## 2017-11-28 DIAGNOSIS — N183 Chronic kidney disease, stage 3 (moderate): Secondary | ICD-10-CM | POA: Diagnosis not present

## 2017-11-28 DIAGNOSIS — I480 Paroxysmal atrial fibrillation: Secondary | ICD-10-CM | POA: Diagnosis not present

## 2017-11-30 ENCOUNTER — Telehealth (HOSPITAL_COMMUNITY): Payer: Self-pay

## 2017-11-30 DIAGNOSIS — I2510 Atherosclerotic heart disease of native coronary artery without angina pectoris: Secondary | ICD-10-CM | POA: Diagnosis not present

## 2017-11-30 DIAGNOSIS — Z952 Presence of prosthetic heart valve: Secondary | ICD-10-CM | POA: Diagnosis not present

## 2017-11-30 DIAGNOSIS — I480 Paroxysmal atrial fibrillation: Secondary | ICD-10-CM | POA: Diagnosis not present

## 2017-11-30 DIAGNOSIS — Z951 Presence of aortocoronary bypass graft: Secondary | ICD-10-CM | POA: Diagnosis not present

## 2017-11-30 DIAGNOSIS — I700 Atherosclerosis of aorta: Secondary | ICD-10-CM | POA: Diagnosis not present

## 2017-11-30 DIAGNOSIS — Z48812 Encounter for surgical aftercare following surgery on the circulatory system: Secondary | ICD-10-CM | POA: Diagnosis not present

## 2017-11-30 DIAGNOSIS — I130 Hypertensive heart and chronic kidney disease with heart failure and stage 1 through stage 4 chronic kidney disease, or unspecified chronic kidney disease: Secondary | ICD-10-CM | POA: Diagnosis not present

## 2017-11-30 DIAGNOSIS — N183 Chronic kidney disease, stage 3 (moderate): Secondary | ICD-10-CM | POA: Diagnosis not present

## 2017-11-30 DIAGNOSIS — I5032 Chronic diastolic (congestive) heart failure: Secondary | ICD-10-CM | POA: Diagnosis not present

## 2017-11-30 NOTE — Telephone Encounter (Signed)
Patients insurance is active and benefits verified through [facility] - $10.00 co-pay, no deductible, out of pocket amount of $5,900/$5,541.93 has been met, no co-insurance, and no pre-authorization is required. Passport/reference [phone]  Patients insurance is active and benefits verified through [place] -  No co-pay, no deductible, no out of pocket, no co-insurance, and no pre-authorization is required. Passport/reference [phone]  Patient will be contacted and scheduled after their follow  Up appt with the Cardiologist office upon review by the RN Navigator.

## 2017-12-04 DIAGNOSIS — I5032 Chronic diastolic (congestive) heart failure: Secondary | ICD-10-CM | POA: Diagnosis not present

## 2017-12-04 DIAGNOSIS — I700 Atherosclerosis of aorta: Secondary | ICD-10-CM | POA: Diagnosis not present

## 2017-12-04 DIAGNOSIS — Z48812 Encounter for surgical aftercare following surgery on the circulatory system: Secondary | ICD-10-CM | POA: Diagnosis not present

## 2017-12-04 DIAGNOSIS — N183 Chronic kidney disease, stage 3 (moderate): Secondary | ICD-10-CM | POA: Diagnosis not present

## 2017-12-04 DIAGNOSIS — Z951 Presence of aortocoronary bypass graft: Secondary | ICD-10-CM | POA: Diagnosis not present

## 2017-12-04 DIAGNOSIS — I2510 Atherosclerotic heart disease of native coronary artery without angina pectoris: Secondary | ICD-10-CM | POA: Diagnosis not present

## 2017-12-04 DIAGNOSIS — Z952 Presence of prosthetic heart valve: Secondary | ICD-10-CM | POA: Diagnosis not present

## 2017-12-04 DIAGNOSIS — I480 Paroxysmal atrial fibrillation: Secondary | ICD-10-CM | POA: Diagnosis not present

## 2017-12-04 DIAGNOSIS — I130 Hypertensive heart and chronic kidney disease with heart failure and stage 1 through stage 4 chronic kidney disease, or unspecified chronic kidney disease: Secondary | ICD-10-CM | POA: Diagnosis not present

## 2017-12-06 DIAGNOSIS — I350 Nonrheumatic aortic (valve) stenosis: Secondary | ICD-10-CM | POA: Diagnosis not present

## 2017-12-06 DIAGNOSIS — Z09 Encounter for follow-up examination after completed treatment for conditions other than malignant neoplasm: Secondary | ICD-10-CM | POA: Diagnosis not present

## 2017-12-07 ENCOUNTER — Ambulatory Visit (INDEPENDENT_AMBULATORY_CARE_PROVIDER_SITE_OTHER): Payer: Medicare HMO | Admitting: Pharmacist

## 2017-12-07 ENCOUNTER — Encounter: Payer: Self-pay | Admitting: Internal Medicine

## 2017-12-07 ENCOUNTER — Ambulatory Visit: Payer: Medicare HMO | Admitting: *Deleted

## 2017-12-07 DIAGNOSIS — Z9889 Other specified postprocedural states: Secondary | ICD-10-CM | POA: Diagnosis not present

## 2017-12-07 DIAGNOSIS — I480 Paroxysmal atrial fibrillation: Secondary | ICD-10-CM

## 2017-12-07 DIAGNOSIS — Z951 Presence of aortocoronary bypass graft: Secondary | ICD-10-CM | POA: Diagnosis not present

## 2017-12-07 DIAGNOSIS — Z953 Presence of xenogenic heart valve: Secondary | ICD-10-CM

## 2017-12-07 DIAGNOSIS — Z8679 Personal history of other diseases of the circulatory system: Secondary | ICD-10-CM | POA: Diagnosis not present

## 2017-12-07 DIAGNOSIS — Z5181 Encounter for therapeutic drug level monitoring: Secondary | ICD-10-CM | POA: Diagnosis not present

## 2017-12-07 LAB — CUP PACEART INCLINIC DEVICE CHECK
Battery Remaining Longevity: 67 mo
Battery Voltage: 3.15 V
Brady Statistic AS VS Percent: 3.45 %
Implantable Lead Location: 753859
Implantable Lead Model: 3830
Implantable Pulse Generator Implant Date: 20181211
Lead Channel Impedance Value: 285 Ohm
Lead Channel Impedance Value: 399 Ohm
Lead Channel Impedance Value: 418 Ohm
Lead Channel Pacing Threshold Amplitude: 0.75 V
Lead Channel Pacing Threshold Amplitude: 1 V
Lead Channel Pacing Threshold Pulse Width: 0.4 ms
Lead Channel Pacing Threshold Pulse Width: 0.4 ms
Lead Channel Sensing Intrinsic Amplitude: 3.5 mV
Lead Channel Setting Pacing Amplitude: 3.5 V
Lead Channel Setting Sensing Sensitivity: 1.2 mV
MDC IDC LEAD IMPLANT DT: 20181211
MDC IDC LEAD LOCATION: 753859
MDC IDC MSMT LEADCHNL RA SENSING INTR AMPL: 5.5 mV
MDC IDC MSMT LEADCHNL RV IMPEDANCE VALUE: 304 Ohm
MDC IDC MSMT LEADCHNL RV SENSING INTR AMPL: 3.375 mV
MDC IDC MSMT LEADCHNL RV SENSING INTR AMPL: 3.875 mV
MDC IDC SESS DTM: 20181227170030
MDC IDC SET LEADCHNL RA PACING AMPLITUDE: 3.5 V
MDC IDC SET LEADCHNL RV PACING PULSEWIDTH: 1 ms
MDC IDC STAT BRADY AP VP PERCENT: 0.35 %
MDC IDC STAT BRADY AP VS PERCENT: 0.02 %
MDC IDC STAT BRADY AS VP PERCENT: 96.13 %
MDC IDC STAT BRADY RA PERCENT PACED: 0.55 %
MDC IDC STAT BRADY RV PERCENT PACED: 96.18 %

## 2017-12-07 LAB — POCT INR: INR: 2.3

## 2017-12-07 NOTE — Patient Instructions (Signed)
Description   Continue 1 tablet (5mg ) daily except 1/2 tablet(2.5mg ) on Mondays and Fridays. Recheck INR in 1 week. Call with any new medications scheduled procedures or any questions  [phone]

## 2017-12-07 NOTE — Progress Notes (Signed)
Wound check appointment. Steri-strips removed. Wound without redness or edema. Incision edges approximated, wound well healed. Normal device function. Thresholds, sensing, and impedances consistent with implant measurements. Device programmed at 3.5V/auto capture programmed on for extra safety margin until 3 month visit. 12 lead EKG of HBP has LBBB morphology.  Histogram distribution appropriate for patient and level of activity. 9.4% AT/AF + Coumadin.  No high ventricular rates noted. Patient educated about wound care, arm mobility, lifting restrictions. ROV 02/27/18 w/ GT

## 2017-12-11 ENCOUNTER — Ambulatory Visit (INDEPENDENT_AMBULATORY_CARE_PROVIDER_SITE_OTHER): Payer: Self-pay | Admitting: *Deleted

## 2017-12-11 DIAGNOSIS — I480 Paroxysmal atrial fibrillation: Secondary | ICD-10-CM | POA: Diagnosis not present

## 2017-12-11 DIAGNOSIS — I700 Atherosclerosis of aorta: Secondary | ICD-10-CM | POA: Diagnosis not present

## 2017-12-11 DIAGNOSIS — Z951 Presence of aortocoronary bypass graft: Secondary | ICD-10-CM | POA: Diagnosis not present

## 2017-12-11 DIAGNOSIS — N183 Chronic kidney disease, stage 3 (moderate): Secondary | ICD-10-CM | POA: Diagnosis not present

## 2017-12-11 DIAGNOSIS — Z4802 Encounter for removal of sutures: Secondary | ICD-10-CM

## 2017-12-11 DIAGNOSIS — I130 Hypertensive heart and chronic kidney disease with heart failure and stage 1 through stage 4 chronic kidney disease, or unspecified chronic kidney disease: Secondary | ICD-10-CM | POA: Diagnosis not present

## 2017-12-11 DIAGNOSIS — Z48812 Encounter for surgical aftercare following surgery on the circulatory system: Secondary | ICD-10-CM | POA: Diagnosis not present

## 2017-12-11 DIAGNOSIS — I2510 Atherosclerotic heart disease of native coronary artery without angina pectoris: Secondary | ICD-10-CM | POA: Diagnosis not present

## 2017-12-11 DIAGNOSIS — I5032 Chronic diastolic (congestive) heart failure: Secondary | ICD-10-CM | POA: Diagnosis not present

## 2017-12-11 DIAGNOSIS — Z952 Presence of prosthetic heart valve: Secondary | ICD-10-CM | POA: Diagnosis not present

## 2017-12-11 DIAGNOSIS — Z953 Presence of xenogenic heart valve: Secondary | ICD-10-CM

## 2017-12-11 NOTE — Progress Notes (Signed)
Brandon [name]  returns for suture removal of his four previous chest tube sites. These as well as his sternal and left EVH sites are very well healed. He is doing well at home...with walking, diet and bowels. He has seen cardiology. Sutures were easily removed. He will return as scheduled with a CXR.

## 2017-12-14 ENCOUNTER — Ambulatory Visit (INDEPENDENT_AMBULATORY_CARE_PROVIDER_SITE_OTHER): Payer: Medicare HMO

## 2017-12-14 DIAGNOSIS — Z951 Presence of aortocoronary bypass graft: Secondary | ICD-10-CM

## 2017-12-14 DIAGNOSIS — Z953 Presence of xenogenic heart valve: Secondary | ICD-10-CM

## 2017-12-14 DIAGNOSIS — I480 Paroxysmal atrial fibrillation: Secondary | ICD-10-CM

## 2017-12-14 DIAGNOSIS — Z8679 Personal history of other diseases of the circulatory system: Secondary | ICD-10-CM | POA: Diagnosis not present

## 2017-12-14 DIAGNOSIS — Z5181 Encounter for therapeutic drug level monitoring: Secondary | ICD-10-CM

## 2017-12-14 DIAGNOSIS — Z9889 Other specified postprocedural states: Secondary | ICD-10-CM | POA: Diagnosis not present

## 2017-12-14 LAB — POCT INR: INR: 2.3

## 2017-12-14 NOTE — Patient Instructions (Addendum)
Description   Continue 1 tablet (5mg ) daily except 1/2 tablet(2.5mg ) on Mondays and Fridays. Recheck INR in 10 days. Call with any new medications scheduled procedures or any questions  [phone]

## 2017-12-15 DIAGNOSIS — Z952 Presence of prosthetic heart valve: Secondary | ICD-10-CM | POA: Diagnosis not present

## 2017-12-15 DIAGNOSIS — I480 Paroxysmal atrial fibrillation: Secondary | ICD-10-CM | POA: Diagnosis not present

## 2017-12-15 DIAGNOSIS — I700 Atherosclerosis of aorta: Secondary | ICD-10-CM | POA: Diagnosis not present

## 2017-12-15 DIAGNOSIS — N183 Chronic kidney disease, stage 3 (moderate): Secondary | ICD-10-CM | POA: Diagnosis not present

## 2017-12-15 DIAGNOSIS — Z951 Presence of aortocoronary bypass graft: Secondary | ICD-10-CM | POA: Diagnosis not present

## 2017-12-15 DIAGNOSIS — Z48812 Encounter for surgical aftercare following surgery on the circulatory system: Secondary | ICD-10-CM | POA: Diagnosis not present

## 2017-12-15 DIAGNOSIS — I2510 Atherosclerotic heart disease of native coronary artery without angina pectoris: Secondary | ICD-10-CM | POA: Diagnosis not present

## 2017-12-15 DIAGNOSIS — I5032 Chronic diastolic (congestive) heart failure: Secondary | ICD-10-CM | POA: Diagnosis not present

## 2017-12-15 DIAGNOSIS — I130 Hypertensive heart and chronic kidney disease with heart failure and stage 1 through stage 4 chronic kidney disease, or unspecified chronic kidney disease: Secondary | ICD-10-CM | POA: Diagnosis not present

## 2017-12-18 DIAGNOSIS — Z48812 Encounter for surgical aftercare following surgery on the circulatory system: Secondary | ICD-10-CM | POA: Diagnosis not present

## 2017-12-18 DIAGNOSIS — I700 Atherosclerosis of aorta: Secondary | ICD-10-CM | POA: Diagnosis not present

## 2017-12-18 DIAGNOSIS — I130 Hypertensive heart and chronic kidney disease with heart failure and stage 1 through stage 4 chronic kidney disease, or unspecified chronic kidney disease: Secondary | ICD-10-CM | POA: Diagnosis not present

## 2017-12-18 DIAGNOSIS — Z951 Presence of aortocoronary bypass graft: Secondary | ICD-10-CM | POA: Diagnosis not present

## 2017-12-18 DIAGNOSIS — I5032 Chronic diastolic (congestive) heart failure: Secondary | ICD-10-CM | POA: Diagnosis not present

## 2017-12-18 DIAGNOSIS — N183 Chronic kidney disease, stage 3 (moderate): Secondary | ICD-10-CM | POA: Diagnosis not present

## 2017-12-18 DIAGNOSIS — I480 Paroxysmal atrial fibrillation: Secondary | ICD-10-CM | POA: Diagnosis not present

## 2017-12-18 DIAGNOSIS — I2510 Atherosclerotic heart disease of native coronary artery without angina pectoris: Secondary | ICD-10-CM | POA: Diagnosis not present

## 2017-12-18 DIAGNOSIS — Z952 Presence of prosthetic heart valve: Secondary | ICD-10-CM | POA: Diagnosis not present

## 2017-12-21 DIAGNOSIS — N183 Chronic kidney disease, stage 3 (moderate): Secondary | ICD-10-CM | POA: Diagnosis not present

## 2017-12-21 DIAGNOSIS — I130 Hypertensive heart and chronic kidney disease with heart failure and stage 1 through stage 4 chronic kidney disease, or unspecified chronic kidney disease: Secondary | ICD-10-CM | POA: Diagnosis not present

## 2017-12-21 DIAGNOSIS — Z48812 Encounter for surgical aftercare following surgery on the circulatory system: Secondary | ICD-10-CM | POA: Diagnosis not present

## 2017-12-21 DIAGNOSIS — Z952 Presence of prosthetic heart valve: Secondary | ICD-10-CM | POA: Diagnosis not present

## 2017-12-21 DIAGNOSIS — I480 Paroxysmal atrial fibrillation: Secondary | ICD-10-CM | POA: Diagnosis not present

## 2017-12-21 DIAGNOSIS — I5032 Chronic diastolic (congestive) heart failure: Secondary | ICD-10-CM | POA: Diagnosis not present

## 2017-12-21 DIAGNOSIS — Z951 Presence of aortocoronary bypass graft: Secondary | ICD-10-CM | POA: Diagnosis not present

## 2017-12-21 DIAGNOSIS — I2510 Atherosclerotic heart disease of native coronary artery without angina pectoris: Secondary | ICD-10-CM | POA: Diagnosis not present

## 2017-12-21 DIAGNOSIS — I700 Atherosclerosis of aorta: Secondary | ICD-10-CM | POA: Diagnosis not present

## 2017-12-22 DIAGNOSIS — H40033 Anatomical narrow angle, bilateral: Secondary | ICD-10-CM | POA: Diagnosis not present

## 2017-12-22 DIAGNOSIS — H2513 Age-related nuclear cataract, bilateral: Secondary | ICD-10-CM | POA: Diagnosis not present

## 2017-12-25 ENCOUNTER — Ambulatory Visit
Admission: RE | Admit: 2017-12-25 | Discharge: 2017-12-25 | Disposition: A | Discharge status: Home / Self Care | Payer: Medicare HMO | Source: Ambulatory Visit | Attending: Thoracic Surgery (Cardiothoracic Vascular Surgery) | Admitting: Thoracic Surgery (Cardiothoracic Vascular Surgery)

## 2017-12-25 ENCOUNTER — Ambulatory Visit (INDEPENDENT_AMBULATORY_CARE_PROVIDER_SITE_OTHER): Payer: Self-pay | Admitting: Physician Assistant

## 2017-12-25 ENCOUNTER — Other Ambulatory Visit: Payer: Self-pay | Admitting: Thoracic Surgery (Cardiothoracic Vascular Surgery)

## 2017-12-25 VITALS — BP 105/69 | HR 85 | Resp 20 | Ht 69.0 in | Wt 233.0 lb

## 2017-12-25 DIAGNOSIS — I2510 Atherosclerotic heart disease of native coronary artery without angina pectoris: Secondary | ICD-10-CM

## 2017-12-25 DIAGNOSIS — J449 Chronic obstructive pulmonary disease, unspecified: Secondary | ICD-10-CM | POA: Diagnosis not present

## 2017-12-25 DIAGNOSIS — Z953 Presence of xenogenic heart valve: Secondary | ICD-10-CM

## 2017-12-25 NOTE — Patient Instructions (Signed)
Endocarditis is a potentially serious infection of heart valves or inside lining of the heart.  It occurs more commonly in patients with diseased heart valves (such as patient's with aortic or mitral valve disease) and in patients who have undergone heart valve repair or replacement.  Certain surgical and dental procedures may put you at risk, such as dental cleaning, other dental procedures, or any surgery involving the respiratory, urinary, gastrointestinal tract, gallbladder or prostate gland.   To minimize your chances for develooping endocarditis, maintain good oral health and seek prompt medical attention for any infections involving the mouth, teeth, gums, skin or urinary tract.    Always notify your doctor or dentist about your underlying heart valve condition before having any invasive procedures. You will need to take antibiotics before certain procedures, including all routine dental cleanings or other dental procedures.  Your cardiologist or dentist should prescribe these antibiotics for you to be taken ahead of time.      You may return to driving an automobile as long as you are no longer requiring oral narcotic pain relievers during the daytime.  It would be wise to start driving only short distances during the daylight and gradually increase from there as you feel comfortable. Brandon [name]

## 2017-12-25 NOTE — Progress Notes (Signed)
HPI: Patient returns for routine postoperative follow-up having undergone AVR, MVR, CABG x 1, and MAZE procedure on 11/16/2017.  The patient's early postoperative recovery while in the hospital was notable for development of CHB requiring placement of PPM.  Since hospital discharge the patient reports *he is doing very well.  He has no complaints.  He states his incisions have healed without difficulty.  He does have an area that is hard along his right leg, but this has been stable since hospital discharge.  He remains on coumadin and does question if he has to switch to Eliqius in a few months.  I explained that he can remain on coumadin if he wishes.  Most people transition to Eliquis or Xarelto for convenience, but due to cost some patients remain on coumadin.  He is hoping to start cardiac rehab soon.   Current Outpatient Medications  Medication Sig Dispense Refill  . acetaminophen (TYLENOL) 325 MG tablet Take 1 tablet (325 mg total) by mouth every 6 (six) hours as needed for mild pain. 30 tablet 0  . aspirin 81 MG EC tablet Take 1 tablet (81 mg total) by mouth daily. 30 tablet 0  . calcium carbonate (TUMS - DOSED IN MG ELEMENTAL CALCIUM) 500 MG chewable tablet Chew 1 tablet by mouth daily as needed for indigestion or heartburn.    . cetirizine (ZYRTEC) 10 MG tablet Take 10 mg by mouth daily as needed for allergies.    . citalopram (CELEXA) 20 MG tablet Take 1 tablet (20 mg total) by mouth at bedtime. 30 tablet 5  . docusate sodium (COLACE) 100 MG capsule Take 1 capsule (100 mg total) by mouth 3 (three) times daily as needed. 20 capsule 0  . ferrous sulfate 325 (65 FE) MG tablet Take 1 tablet (325 mg total) by mouth daily with breakfast. 30 tablet 3  . furosemide (LASIX) 40 MG tablet Please take 1 tab (40mg ) twice a day for 2 weeks then take 1 tab (40mg ) once a day until follow-up. 60 tablet 1  . LORazepam (ATIVAN) 0.5 MG tablet Take 1 tablet (0.5 mg total) by mouth every 6 (six) hours as needed  for anxiety (aggitation). 20 tablet 0  . lovastatin (MEVACOR) 40 MG tablet TAKE 1 TABLET AT BEDTIME  (NEW  DOSE) 90 tablet 3  . ondansetron (ZOFRAN) 4 MG tablet Take 4 mg by mouth every 8 (eight) hours as needed for nausea or vomiting.    [name] oxyCODONE (OXY IR/ROXICODONE) 5 MG immediate release tablet Take 1 tablet (5 mg total) by mouth every 4 (four) hours as needed for severe pain. 30 tablet 0  . pantoprazole (PROTONIX) 40 MG tablet Take 1 tablet (40 mg total) by mouth daily. 30 tablet 1  . potassium chloride SA (K-DUR,KLOR-CON) 20 MEQ tablet Please take one tab (20 MEQ) twice a day for 2 weeks then take one tab [facility]) once a day until follow-up. 60 tablet 1  . traZODone (DESYREL) 50 MG tablet Take 1 tablet (50 mg total) by mouth at bedtime. 30 tablet 5  . warfarin (COUMADIN) 5 MG tablet Take 1 tablet (5 mg total) by mouth daily at 6 PM. 30 tablet 1   No current facility-administered medications for this visit.     Physical Exam:  BP 105/69   Pulse 85   Resp 20   Ht 5\' 9"  (1.753 m)   Wt 233 lb (105.7 kg)   SpO2 96% Comment: RA  BMI 34.41 kg/m   Gen: no apparent distress  Heart: RRR, paced Lungs: CTA bilaterally Ext: minimal edema Skin: incisions clean and dry  Diagnostic Tests:  CXR; cardiomegaly, PPM wires remain in place, no pleural effusions present   A/P:  1. S/P AVR, MVR, CABG, MAZE- doing very well, NSR today with PPM in place 2. INR 2.3 will remain on coumadin for 3 months.. Can remain on coumadin at that time vs. Switching to NOAC depending on cost, as patient voiced concern over insurance copay for Eliquis 3. Activity- continue as tolerated, educated to continue to restrict weight to 8-10 lbs, ambulate 3 times per day, may resume driving.Faythe Ghee to travel for a few days as long as they stop and walk during drive 4. RTC in 3 months or sooner if need arises  Ellwood Handler, PA-C [facility] [phone]

## 2017-12-26 ENCOUNTER — Other Ambulatory Visit: Payer: Self-pay

## 2017-12-26 ENCOUNTER — Ambulatory Visit (INDEPENDENT_AMBULATORY_CARE_PROVIDER_SITE_OTHER): Payer: Medicare HMO | Admitting: *Deleted

## 2017-12-26 ENCOUNTER — Ambulatory Visit (HOSPITAL_COMMUNITY)
Admission: RE | Admit: 2017-12-26 | Discharge: 2017-12-26 | Disposition: A | Discharge status: Home / Self Care | Payer: Medicare HMO | Source: Ambulatory Visit | Attending: Cardiology | Admitting: Cardiology

## 2017-12-26 ENCOUNTER — Encounter (HOSPITAL_COMMUNITY): Payer: Self-pay | Admitting: Cardiology

## 2017-12-26 ENCOUNTER — Telehealth (HOSPITAL_COMMUNITY): Payer: Self-pay | Admitting: *Deleted

## 2017-12-26 ENCOUNTER — Encounter (HOSPITAL_COMMUNITY): Payer: Self-pay

## 2017-12-26 VITALS — BP 140/81 | HR 81 | Wt 235.0 lb

## 2017-12-26 DIAGNOSIS — Z5181 Encounter for therapeutic drug level monitoring: Secondary | ICD-10-CM

## 2017-12-26 DIAGNOSIS — Z953 Presence of xenogenic heart valve: Secondary | ICD-10-CM | POA: Diagnosis not present

## 2017-12-26 DIAGNOSIS — I130 Hypertensive heart and chronic kidney disease with heart failure and stage 1 through stage 4 chronic kidney disease, or unspecified chronic kidney disease: Secondary | ICD-10-CM | POA: Diagnosis not present

## 2017-12-26 DIAGNOSIS — Z951 Presence of aortocoronary bypass graft: Secondary | ICD-10-CM

## 2017-12-26 DIAGNOSIS — I080 Rheumatic disorders of both mitral and aortic valves: Secondary | ICD-10-CM

## 2017-12-26 DIAGNOSIS — Z7901 Long term (current) use of anticoagulants: Secondary | ICD-10-CM | POA: Diagnosis not present

## 2017-12-26 DIAGNOSIS — H9190 Unspecified hearing loss, unspecified ear: Secondary | ICD-10-CM | POA: Insufficient documentation

## 2017-12-26 DIAGNOSIS — Z8679 Personal history of other diseases of the circulatory system: Secondary | ICD-10-CM

## 2017-12-26 DIAGNOSIS — E785 Hyperlipidemia, unspecified: Secondary | ICD-10-CM | POA: Diagnosis not present

## 2017-12-26 DIAGNOSIS — J449 Chronic obstructive pulmonary disease, unspecified: Secondary | ICD-10-CM | POA: Insufficient documentation

## 2017-12-26 DIAGNOSIS — N183 Chronic kidney disease, stage 3 (moderate): Secondary | ICD-10-CM | POA: Diagnosis not present

## 2017-12-26 DIAGNOSIS — Z87891 Personal history of nicotine dependence: Secondary | ICD-10-CM | POA: Diagnosis not present

## 2017-12-26 DIAGNOSIS — Z79891 Long term (current) use of opiate analgesic: Secondary | ICD-10-CM | POA: Insufficient documentation

## 2017-12-26 DIAGNOSIS — I38 Endocarditis, valve unspecified: Secondary | ICD-10-CM | POA: Insufficient documentation

## 2017-12-26 DIAGNOSIS — I2510 Atherosclerotic heart disease of native coronary artery without angina pectoris: Secondary | ICD-10-CM | POA: Diagnosis not present

## 2017-12-26 DIAGNOSIS — Z952 Presence of prosthetic heart valve: Secondary | ICD-10-CM | POA: Diagnosis not present

## 2017-12-26 DIAGNOSIS — I5032 Chronic diastolic (congestive) heart failure: Secondary | ICD-10-CM | POA: Insufficient documentation

## 2017-12-26 DIAGNOSIS — I480 Paroxysmal atrial fibrillation: Secondary | ICD-10-CM

## 2017-12-26 DIAGNOSIS — Z8673 Personal history of transient ischemic attack (TIA), and cerebral infarction without residual deficits: Secondary | ICD-10-CM

## 2017-12-26 DIAGNOSIS — D509 Iron deficiency anemia, unspecified: Secondary | ICD-10-CM | POA: Diagnosis not present

## 2017-12-26 DIAGNOSIS — Z9889 Other specified postprocedural states: Secondary | ICD-10-CM

## 2017-12-26 DIAGNOSIS — Z79899 Other long term (current) drug therapy: Secondary | ICD-10-CM | POA: Diagnosis not present

## 2017-12-26 LAB — BASIC METABOLIC PANEL
ANION GAP: 11 (ref 5–15)
BUN: 28 mg/dL — AB (ref 6–20)
CO2: 20 mmol/L — ABNORMAL LOW (ref 22–32)
Calcium: 9 mg/dL (ref 8.9–10.3)
Chloride: 104 mmol/L (ref 101–111)
Creatinine, Ser: 1.68 mg/dL — ABNORMAL HIGH (ref 0.61–1.24)
GFR, EST AFRICAN AMERICAN: 46 mL/min — AB (ref >60)
GFR, EST NON AFRICAN AMERICAN: 39 mL/min — AB (ref >60)
Glucose, Bld: 106 mg/dL — ABNORMAL HIGH (ref 65–99)
POTASSIUM: 4.6 mmol/L (ref 3.5–5.1)
SODIUM: 135 mmol/L (ref 135–145)

## 2017-12-26 LAB — CBC
HCT: 34 % — ABNORMAL LOW (ref 39.0–52.0)
Hemoglobin: 10.8 g/dL — ABNORMAL LOW (ref 13.0–17.0)
MCH: 30.5 pg (ref 26.0–34.0)
MCHC: 31.8 g/dL (ref 30.0–36.0)
MCV: 96 fL (ref 78.0–100.0)
PLATELETS: 287 10*3/uL (ref 150–400)
RBC: 3.54 MIL/uL — AB (ref 4.22–5.81)
RDW: 14.5 % (ref 11.5–15.5)
WBC: 8.9 10*3/uL (ref 4.0–10.5)

## 2017-12-26 LAB — LIPID PANEL
CHOL/HDL RATIO: 2.8 ratio
CHOLESTEROL: 132 mg/dL (ref 0–200)
HDL: 47 mg/dL (ref >40)
LDL Cholesterol: 66 mg/dL (ref 0–99)
Triglycerides: 97 mg/dL (ref <150)
VLDL: 19 mg/dL (ref 0–40)

## 2017-12-26 LAB — POCT INR: INR: 1.8

## 2017-12-26 NOTE — Patient Instructions (Signed)
Stop Asprin   Your physician has requested that you have an echocardiogram. Echocardiography is a painless test that uses sound waves to create images of your heart. It provides your doctor with information about the size and shape of your heart and how well your heart's chambers and valves are working. This procedure takes approximately one hour. There are no restrictions for this procedure.  Labs drawn today (if we do not call you, then your lab work was stable)   Your physician recommends that you schedule a follow-up appointment in: 3 months with Dr. Aundra Dubin  (we will call you)

## 2017-12-26 NOTE — Telephone Encounter (Signed)
-----   Message from Evans Lance, MD sent at 12/26/2017  5:19 PM EST ----- Regarding: RE: Restrictions of activty at cardiac rehab May proceed with cardiac rehab with no limitations. GT ----- Message ----- From: Rowe Pavy, RN Sent: 12/25/2017  10:10 PM To: Evans Lance, MD Subject: Restrictions of activty at cardiac rehab       Dr. Lovena Le,  The above patient s/p 12/6 CABG x 1, AVR and MVR is eligible to participate in cardiac rehab.  Pt had PPM placed on 12/11.   Pt completed wound check on 12/27.  Pt will see you on 3/19. At this point in his recovery, any restrictions and/or limitations of activity?  Thank you for your guidance Maurice Small RN, BSN Cardiac and Pulmonary Rehab Nurse Navigator

## 2017-12-26 NOTE — Patient Instructions (Signed)
Description   Today January 15th take 1 and 1/2 tablets (7.5mg ) then continue 1 tablet (5mg ) daily except 1/2 tablet(2.5mg ) on Mondays and Fridays. Recheck INR in 1 week. Call with any new medications scheduled procedures or any questions  [phone]

## 2017-12-26 NOTE — Progress Notes (Signed)
PCP: Dr. Justin Mend Cardiology: Dr. Radford Pax HF Cardiology: Dr. Aundra Dubin  72 yo with history of CVA, paroxysmal atrial fibrillation, valvular heart disease, and chronic diastolic CHF presents for evaluation of CHF and valvular heart disease.    He had had episodes of dyspnea and initial workup led to a TEE in 5/18 showing normal EF with moderate AS and moderate MR.  He continued to have episodic dyspnea and ended up admitted in 8/18 with shortness of breath and chest pressure.  This led to a long, complicated hospitalization.  He was noted to be volume overloaded with acute diastolic CHF and was also noted to have symptomatic runs of atrial fibrillation with RVR.  Amiodarone was started to control the atrial fibrillation.  He developed respiratory distress => Bipap => intubated.  He became hypotensive, requiring pressors.  He developed AKI.  PNA was noted and he received broad spectrum abx.  He had a prolonged intubation but was eventually extubated.  Given deconditioning from long hospitalization, he went to CIR for a couple of weeks.  LHC in 9/18 showed occluded PDA with left>right collaterals.  TEE in 9/18 showed EF 50%, severe MR (possibly infarct-related with restricted posterior leaflet, and moderate AS + moderate-severe AI (possibly rheumatic).   In 12/18, he had cardiac surgery with bioprosthetic aortic and mitral valves placed.  He had SVG-PDA, Maze, and LA appendage clipping.  Post-op course was complicated by CHB requiring Medtronic dual chamber PPM.    He returns today for followup of CHF and valvular disease.  He is doing well post-op.  Minimal pain.  Walking in [place] with his wife.  No exertional dyspnea on flat ground.  No lightheadedness.  No orthopnea/PND. No palpitations.    ECG (personally reviewed): NSR, v-pacing  Labs (9/18): K 4.5, creatinine 1.42, LDL 90, HDL 36, LFTs normal, TSH normal, hgb 9.6 Labs (10/18): K 4.3, creatinine 1.45 Labs (12/18): K 3.8, creatinine 1.56  PMH: 1. CVA:  Right MCA, 2004.  Minimal residual problems.  2. HTN 3. Hyperlipidemia 4. Left rotator cuff surgery 6/18 5. Carotid stenosis: s/p right carotid stent.  6. GERD 7. H/o CCY 8. Anemia: FOBT negative.  9. Gout  10. Atrial fibrillation: Paroxysmal. Maze in 12/18 with LAA clipping.  11. Valvular heart disease: TEE (5/18) with EF 55-60%, moderate AS mean 33 mmHg, moderate MR, normal RV size and systolic function.  - Echo (8/18): EF 55-60%, moderate LVH, moderate AS with mean gradient 33 mmHg, moderate AI, moderate to severe MR.  - TEE (9/18): EF 50%, mild LV dilation, suspect severe MR with posterior leaflet restricted (?infarct-related MR), ERO 0.4 cm^2, moderate AS/moderate-severe AI (possibly rheumatic).   - In 12/18, he had cardiac surgery with bioprosthetic aortic and mitral valves placed.  He had SVG-PDA, Maze, and LA appendage clipping. 12. Chronic diastolic CHF 13. Deafness 14. CKD: stage 3.  15. 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  17. COPD: Mild obstruction on 9/18 PFTs.  18. Post-op CHB in 12/18 with Medtronic dual chamber PPM.   SH: Quit smoking 8/18.  Married, 2 children, retired.    Family History  Problem Relation Age of Onset  . Stroke Father        No details  . Angina Mother    ROS: All systems reviewed and negative except as per HPI.   Current Outpatient Medications  Medication Sig Dispense  Refill  . acetaminophen (TYLENOL) 325 MG tablet Take 1 tablet (325 mg total) by mouth every 6 (six) hours as needed for mild pain. 30 tablet 0  . calcium carbonate (TUMS - DOSED IN MG ELEMENTAL CALCIUM) 500 MG chewable tablet Chew 1 tablet by mouth daily as needed for indigestion or heartburn.    . cetirizine (ZYRTEC) 10 MG tablet Take 10 mg by mouth daily as needed for allergies.    . citalopram (CELEXA) 20 MG tablet Take 1 tablet (20 mg  total) by mouth at bedtime. 30 tablet 5  . docusate sodium (COLACE) 100 MG capsule Take 1 capsule (100 mg total) by mouth 3 (three) times daily as needed. 20 capsule 0  . ferrous sulfate 325 (65 FE) MG tablet Take 1 tablet (325 mg total) by mouth daily with breakfast. 30 tablet 3  . furosemide (LASIX) 40 MG tablet Take 40 mg by mouth daily.    [name] LORazepam (ATIVAN) 0.5 MG tablet Take 1 tablet (0.5 mg total) by mouth every 6 (six) hours as needed for anxiety (aggitation). 20 tablet 0  . lovastatin (MEVACOR) 40 MG tablet TAKE 1 TABLET AT BEDTIME  (NEW  DOSE) 90 tablet 3  . ondansetron (ZOFRAN) 4 MG tablet Take 4 mg by mouth every 8 (eight) hours as needed for nausea or vomiting.    [name] oxyCODONE (OXY IR/ROXICODONE) 5 MG immediate release tablet Take 1 tablet (5 mg total) by mouth every 4 (four) hours as needed for severe pain. 30 tablet 0  . pantoprazole (PROTONIX) 40 MG tablet Take 1 tablet (40 mg total) by mouth daily. 30 tablet 1  . potassium chloride SA (K-DUR,KLOR-CON) 20 MEQ tablet Take 20 mEq by mouth daily.    . traZODone (DESYREL) 50 MG tablet Take 1 tablet (50 mg total) by mouth at bedtime. 30 tablet 5  . warfarin (COUMADIN) 5 MG tablet Take 1 tablet (5 mg total) by mouth daily at 6 PM. 30 tablet 1   No current facility-administered medications for this encounter.    BP 140/81   Pulse 81   Wt 235 lb (106.6 kg)   SpO2 99%   BMI 34.70 kg/m   General: NAD Neck: No JVD, no thyromegaly or thyroid nodule.  Lungs: Clear to auscultation bilaterally with normal respiratory effort. CV: Nondisplaced PMI.  Heart regular S1/S2, no S3/S4, 2/6 early SEM RUSB.  No peripheral edema.  No carotid bruit.  Normal pedal pulses.  Abdomen: Soft, nontender, no hepatosplenomegaly, no distention.  Skin: Intact without lesions or rashes.  Neurologic: Alert and oriented x 3.  Psych: Normal affect. Extremities: No clubbing or cyanosis.  HEENT: Normal.   Assessment/Plan: 1. Valvular heart disease: TEE in 9/18  showed severe MR, probably infarct-related with restrictive posterior mitral leaflet. There was moderate aortic stenosis and moderate-severe aortic insufficiency, possible rheumatic.  In 12/18, he had bioprosthetic MVR and AVR.  - I will arrange for echo to get post-op baseline views of his bioprosthetic valves.  - I will refer to cardiac rehab.  2. Chronic diastolic CHF: In setting of significant valvular disease as above.  On exam today, he looks euvolemic. NYHA class II symptoms.  - Continue Lasix 40 mg daily. BMET today.   3. CKD: Stage 3.  Most recent creatinine 1.56.  BMET today.  4. Atrial fibrillation: Paroxysmal.  He tends to tolerate afib poorly.  He is now maintaining NSR after Maze in 12/18.  He is no longer on amiodarone.  - He will continue warfarin  rather than DOAC given bioprosthetic valves.  5. COPD: Mild obstruction on PFTs.  He has quit smoking.    6. CVA: Remote, minimal residual. S/p carotid stenting.  - No ASA given warfarin use.  7. Hyperlipidemia: Check lipids today, goal LDL < 70 with CAD.  8. Anemia: Fe deficiency.  FOBT negative 8/18.  Got feraheme in 8/18.  Should have eventual GI workup.  9. 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  The artery to the PDA was occluded with left to right collaterals.  He had SVG-PDA in 12/18.  - Continue statin.   - No ASA given warfarin use.   Followup 3 months.   Loralie Champagne 12/26/2017

## 2017-12-27 ENCOUNTER — Telehealth (HOSPITAL_COMMUNITY): Payer: Self-pay

## 2017-12-27 NOTE — Telephone Encounter (Signed)
Called and spoke with patient in regards to Cardiac Rehab - Patient is interested in the program. Spoke with wife Katharine Look. Scheduled orientation on 02/01/2018 at 8:45am. Patient will attend the 11:15am exc class.

## 2018-01-02 ENCOUNTER — Ambulatory Visit (INDEPENDENT_AMBULATORY_CARE_PROVIDER_SITE_OTHER): Payer: Medicare HMO | Admitting: *Deleted

## 2018-01-02 ENCOUNTER — Ambulatory Visit (HOSPITAL_COMMUNITY): Payer: Medicare HMO | Attending: Cardiology

## 2018-01-02 ENCOUNTER — Other Ambulatory Visit: Payer: Self-pay

## 2018-01-02 DIAGNOSIS — Z9889 Other specified postprocedural states: Secondary | ICD-10-CM | POA: Diagnosis not present

## 2018-01-02 DIAGNOSIS — Z951 Presence of aortocoronary bypass graft: Secondary | ICD-10-CM

## 2018-01-02 DIAGNOSIS — Z8679 Personal history of other diseases of the circulatory system: Secondary | ICD-10-CM | POA: Diagnosis not present

## 2018-01-02 DIAGNOSIS — Z953 Presence of xenogenic heart valve: Secondary | ICD-10-CM | POA: Diagnosis not present

## 2018-01-02 DIAGNOSIS — Z5181 Encounter for therapeutic drug level monitoring: Secondary | ICD-10-CM | POA: Diagnosis not present

## 2018-01-02 DIAGNOSIS — I110 Hypertensive heart disease with heart failure: Secondary | ICD-10-CM | POA: Diagnosis not present

## 2018-01-02 DIAGNOSIS — I480 Paroxysmal atrial fibrillation: Secondary | ICD-10-CM | POA: Diagnosis not present

## 2018-01-02 DIAGNOSIS — I5032 Chronic diastolic (congestive) heart failure: Secondary | ICD-10-CM | POA: Diagnosis not present

## 2018-01-02 DIAGNOSIS — I509 Heart failure, unspecified: Secondary | ICD-10-CM | POA: Insufficient documentation

## 2018-01-02 DIAGNOSIS — Z952 Presence of prosthetic heart valve: Secondary | ICD-10-CM | POA: Insufficient documentation

## 2018-01-02 LAB — POCT INR: INR: 2

## 2018-01-02 NOTE — Patient Instructions (Signed)
Description   Start taking 1 tablet (5mg ) daily except 1/2 tablet(2.5mg ) on Mondays.  Recheck INR in 10 days. Call with any new medications scheduled procedures or any questions  [phone]

## 2018-01-12 ENCOUNTER — Ambulatory Visit (INDEPENDENT_AMBULATORY_CARE_PROVIDER_SITE_OTHER): Payer: Medicare HMO | Admitting: *Deleted

## 2018-01-12 DIAGNOSIS — Z953 Presence of xenogenic heart valve: Secondary | ICD-10-CM

## 2018-01-12 DIAGNOSIS — I480 Paroxysmal atrial fibrillation: Secondary | ICD-10-CM | POA: Diagnosis not present

## 2018-01-12 DIAGNOSIS — Z5181 Encounter for therapeutic drug level monitoring: Secondary | ICD-10-CM

## 2018-01-12 DIAGNOSIS — Z9889 Other specified postprocedural states: Secondary | ICD-10-CM

## 2018-01-12 DIAGNOSIS — Z8679 Personal history of other diseases of the circulatory system: Secondary | ICD-10-CM

## 2018-01-12 DIAGNOSIS — Z951 Presence of aortocoronary bypass graft: Secondary | ICD-10-CM | POA: Diagnosis not present

## 2018-01-12 LAB — POCT INR: INR: 1.8

## 2018-01-12 NOTE — Patient Instructions (Signed)
Description   Today take 1.5 tablets, then start taking 1 tablet (5mg ) daily.  Recheck INR in 10 days. Call with any new medications scheduled procedures or any questions  [phone]

## 2018-01-15 ENCOUNTER — Ambulatory Visit (HOSPITAL_COMMUNITY)
Admission: RE | Admit: 2018-01-15 | Discharge: 2018-01-15 | Disposition: A | Discharge status: Home / Self Care | Payer: Medicare HMO | Source: Ambulatory Visit | Attending: Cardiology | Admitting: Cardiology

## 2018-01-15 ENCOUNTER — Encounter (HOSPITAL_COMMUNITY): Payer: Self-pay | Admitting: Cardiology

## 2018-01-15 ENCOUNTER — Other Ambulatory Visit: Payer: Self-pay | Admitting: Physician Assistant

## 2018-01-15 VITALS — BP 146/80 | HR 88 | Wt 236.1 lb

## 2018-01-15 DIAGNOSIS — I6529 Occlusion and stenosis of unspecified carotid artery: Secondary | ICD-10-CM | POA: Diagnosis not present

## 2018-01-15 DIAGNOSIS — Z7901 Long term (current) use of anticoagulants: Secondary | ICD-10-CM | POA: Insufficient documentation

## 2018-01-15 DIAGNOSIS — Z953 Presence of xenogenic heart valve: Secondary | ICD-10-CM | POA: Diagnosis not present

## 2018-01-15 DIAGNOSIS — I5022 Chronic systolic (congestive) heart failure: Secondary | ICD-10-CM | POA: Diagnosis not present

## 2018-01-15 DIAGNOSIS — Z87891 Personal history of nicotine dependence: Secondary | ICD-10-CM | POA: Diagnosis not present

## 2018-01-15 DIAGNOSIS — I359 Nonrheumatic aortic valve disorder, unspecified: Secondary | ICD-10-CM | POA: Diagnosis not present

## 2018-01-15 DIAGNOSIS — Z79899 Other long term (current) drug therapy: Secondary | ICD-10-CM | POA: Diagnosis not present

## 2018-01-15 DIAGNOSIS — I480 Paroxysmal atrial fibrillation: Secondary | ICD-10-CM | POA: Diagnosis not present

## 2018-01-15 DIAGNOSIS — I130 Hypertensive heart and chronic kidney disease with heart failure and stage 1 through stage 4 chronic kidney disease, or unspecified chronic kidney disease: Secondary | ICD-10-CM | POA: Diagnosis not present

## 2018-01-15 DIAGNOSIS — M109 Gout, unspecified: Secondary | ICD-10-CM | POA: Insufficient documentation

## 2018-01-15 DIAGNOSIS — E785 Hyperlipidemia, unspecified: Secondary | ICD-10-CM | POA: Insufficient documentation

## 2018-01-15 DIAGNOSIS — J449 Chronic obstructive pulmonary disease, unspecified: Secondary | ICD-10-CM | POA: Diagnosis not present

## 2018-01-15 DIAGNOSIS — N183 Chronic kidney disease, stage 3 unspecified: Secondary | ICD-10-CM

## 2018-01-15 DIAGNOSIS — H9190 Unspecified hearing loss, unspecified ear: Secondary | ICD-10-CM | POA: Diagnosis not present

## 2018-01-15 DIAGNOSIS — K219 Gastro-esophageal reflux disease without esophagitis: Secondary | ICD-10-CM | POA: Diagnosis not present

## 2018-01-15 DIAGNOSIS — Z8673 Personal history of transient ischemic attack (TIA), and cerebral infarction without residual deficits: Secondary | ICD-10-CM | POA: Insufficient documentation

## 2018-01-15 DIAGNOSIS — D509 Iron deficiency anemia, unspecified: Secondary | ICD-10-CM | POA: Diagnosis not present

## 2018-01-15 DIAGNOSIS — I2510 Atherosclerotic heart disease of native coronary artery without angina pectoris: Secondary | ICD-10-CM | POA: Diagnosis not present

## 2018-01-15 DIAGNOSIS — Z8249 Family history of ischemic heart disease and other diseases of the circulatory system: Secondary | ICD-10-CM | POA: Diagnosis not present

## 2018-01-15 DIAGNOSIS — I5042 Chronic combined systolic (congestive) and diastolic (congestive) heart failure: Secondary | ICD-10-CM | POA: Insufficient documentation

## 2018-01-15 DIAGNOSIS — I080 Rheumatic disorders of both mitral and aortic valves: Secondary | ICD-10-CM | POA: Diagnosis not present

## 2018-01-15 DIAGNOSIS — Z9889 Other specified postprocedural states: Secondary | ICD-10-CM | POA: Diagnosis not present

## 2018-01-15 DIAGNOSIS — Z823 Family history of stroke: Secondary | ICD-10-CM | POA: Insufficient documentation

## 2018-01-15 MED ORDER — CARVEDILOL 6.25 MG PO TABS: 6.2500 mg | ORAL_TABLET | Freq: Two times a day (BID) | ORAL | 6 refills | Status: DC

## 2018-01-15 MED ORDER — LOSARTAN POTASSIUM 25 MG PO TABS: 25.0000 mg | ORAL_TABLET | Freq: Every day | ORAL | 6 refills | Status: DC

## 2018-01-15 NOTE — Progress Notes (Signed)
Advanced Heart Failure Clinic Note   PCP: Dr. Justin Mend Cardiology: Dr. Radford Pax HF Cardiology: Dr. Aundra Dubin  72 yo with history of CVA, paroxysmal atrial fibrillation, valvular heart disease, and chronic diastolic CHF presents for evaluation of CHF and valvular heart disease.    He had had episodes of dyspnea and initial workup led to a TEE in 5/18 showing normal EF with moderate AS and moderate MR.  He continued to have episodic dyspnea and ended up admitted in 8/18 with shortness of breath and chest pressure.  This led to a long, complicated hospitalization.  He was noted to be volume overloaded with acute diastolic CHF and was also noted to have symptomatic runs of atrial fibrillation with RVR.  Amiodarone was started to control the atrial fibrillation.  He developed respiratory distress => Bipap => intubated.  He became hypotensive, requiring pressors.  He developed AKI.  PNA was noted and he received broad spectrum abx.  He had a prolonged intubation but was eventually extubated.  Given deconditioning from long hospitalization, he went to CIR for a couple of weeks.  LHC in 9/18 showed occluded PDA with left>right collaterals.  TEE in 9/18 showed EF 50%, severe MR (possibly infarct-related with restricted posterior leaflet, and moderate AS + moderate-severe AI (possibly rheumatic).   In 12/18, he had cardiac surgery with bioprosthetic aortic and mitral valves placed.  He had SVG-PDA, Maze, and LA appendage clipping.  Post-op course was complicated by CHB requiring Medtronic dual chamber PPM.    Echo 01/02/18 LVEF 30-35%, bioprosthetic MV and AoV function normally, RV mildly dilated with normal systolic function.    He returns today for followup of CHF and valvular disease. He is doing well overall. Has recovered from surgery well. He denies DOE, lightheadedness or dizziness. No orthopnea or PND. No palpitations or CP. Walking in [employer] with his wife.   Labs (9/18): K 4.5, creatinine 1.42, LDL 90, HDL  36, LFTs normal, TSH normal, hgb 9.6 Labs (10/18): K 4.3, creatinine 1.45 Labs (12/18): K 3.8, creatinine 1.56 Labs (1/19): creatinine 1.68, LDL 66  PMH: 1. CVA: Right MCA, 2004.  Minimal residual problems.  2. HTN 3. Hyperlipidemia 4. Left rotator cuff surgery 6/18 5. Carotid stenosis: s/p right carotid stent.  6. GERD 7. H/o CCY 8. Anemia: FOBT negative.  9. Gout  10. Atrial fibrillation: Paroxysmal. Maze in 12/18 with LAA clipping.  11. Valvular heart disease: TEE (5/18) with EF 55-60%, moderate AS mean 33 mmHg, moderate MR, normal RV size and systolic function.  - Echo (8/18): EF 55-60%, moderate LVH, moderate AS with mean gradient 33 mmHg, moderate AI, moderate to severe MR.  - TEE (9/18): EF 50%, mild LV dilation, suspect severe MR with posterior leaflet restricted (?infarct-related MR), ERO 0.4 cm^2, moderate AS/moderate-severe AI (possibly rheumatic).   - In 12/18, he had cardiac surgery with bioprosthetic aortic and mitral valves placed.  He had SVG-PDA, Maze, and LA appendage clipping. - Echo (1/19): LVEF 30-35%, bioprosthetic MV and AoV function normally, RV mildly dilated with normal systolic function.   12. Chronic diastolic CHF 13. Deafness 14. CKD: stage 3.  15. 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  17. COPD: Mild obstruction on 9/18 PFTs.  18. Post-op CHB in 12/18 with Medtronic dual chamber PPM.  19. Chronic systolic CHF: Echo (0/63) with EF down to 30-35% post-op.  SH: Quit smoking 8/18.  Married, 2 children, retired.    Family History  Problem Relation Age of Onset  . Stroke Father        No details  . Angina Mother    Review of systems complete and found to be negative unless listed in HPI.    Current Outpatient Medications  Medication Sig Dispense Refill  . acetaminophen (TYLENOL) 325 MG tablet Take 1 tablet  (325 mg total) by mouth every 6 (six) hours as needed for mild pain. 30 tablet 0  . calcium carbonate (TUMS - DOSED IN MG ELEMENTAL CALCIUM) 500 MG chewable tablet Chew 1 tablet by mouth daily as needed for indigestion or heartburn.    . cetirizine (ZYRTEC) 10 MG tablet Take 10 mg by mouth daily as needed for allergies.    . citalopram (CELEXA) 20 MG tablet Take 1 tablet (20 mg total) by mouth at bedtime. 30 tablet 5  . docusate sodium (COLACE) 100 MG capsule Take 1 capsule (100 mg total) by mouth 3 (three) times daily as needed. 20 capsule 0  . ferrous sulfate 325 (65 FE) MG tablet Take 1 tablet (325 mg total) by mouth daily with breakfast. 30 tablet 3  . furosemide (LASIX) 40 MG tablet Take 40 mg by mouth daily.    [name] LORazepam (ATIVAN) 0.5 MG tablet Take 1 tablet (0.5 mg total) by mouth every 6 (six) hours as needed for anxiety (aggitation). 20 tablet 0  . lovastatin (MEVACOR) 40 MG tablet TAKE 1 TABLET AT BEDTIME  (NEW  DOSE) 90 tablet 3  . ondansetron (ZOFRAN) 4 MG tablet Take 4 mg by mouth every 8 (eight) hours as needed for nausea or vomiting.    [name] oxyCODONE (OXY IR/ROXICODONE) 5 MG immediate release tablet Take 1 tablet (5 mg total) by mouth every 4 (four) hours as needed for severe pain. 30 tablet 0  . pantoprazole (PROTONIX) 40 MG tablet Take 1 tablet (40 mg total) by mouth daily. 30 tablet 1  . potassium chloride SA (K-DUR,KLOR-CON) 20 MEQ tablet Take 20 mEq by mouth daily.    . traZODone (DESYREL) 50 MG tablet Take 1 tablet (50 mg total) by mouth at bedtime. 30 tablet 5  . warfarin (COUMADIN) 5 MG tablet Take 1 tablet (5 mg total) by mouth daily at 6 PM. 30 tablet 1   No current facility-administered medications for this encounter.    Vitals:   01/15/18 1543  BP: (!) 146/80  Pulse: 88  SpO2: 98%  Weight: 236 lb 1.9 oz (107.1 kg)   Wt Readings from Last 3 Encounters:  01/15/18 236 lb 1.9 oz (107.1 kg)  12/26/17 235 lb (106.6 kg)  12/25/17 233 lb (105.7 kg)   General:  NAD Neck: No JVD, no thyromegaly or thyroid nodule.  Lungs: Clear to auscultation bilaterally with normal respiratory effort. CV: Nondisplaced PMI.  Heart regular S1/S2, no S3/S4, 2/6 early SEM RUSB.  No peripheral edema.  No carotid bruit.  Normal pedal pulses.  Abdomen: Soft, nontender, no hepatosplenomegaly, no distention.  Skin: Intact without lesions or rashes.  Neurologic: Alert and oriented x 3.  Psych: Normal affect. Extremities: No clubbing or cyanosis.  HEENT: Normal.   Assessment/Plan: 1. Valvular heart disease: TEE in 9/18 showed severe MR, probably infarct-related with restrictive posterior mitral leaflet. There was moderate aortic stenosis and moderate-severe aortic insufficiency, possible rheumatic.  In 12/18, he had bioprosthetic MVR and AVR.  - Echo post op showed EF 30-35% 01/02/18  - Referred to cardiac  rehab. Orients this month. 2. Chronic combined CHF: In setting of significant valvular disease as above. EF down to 30-35% post-op, likely reflects true LV systolic function without the volume load from severe MR and moderate-severe AI.  - Volume status stable on exam. NYHA class II symptoms.  - Continue Lasix 40 mg daily. BMET today.   - Start coreg 6.25 mg BID - Start losartan 25 mg qhs. BMET 1 week.  - Followup in pharmacy clinic in 1 month, can transition to [facility] if BP/creatinine stable and will need to add spironolactone.  - Reinforced fluid restriction to < 2 L daily, sodium restriction to less than 2000 mg daily, and the importance of daily weights.   3. CKD: Stage 3. - Most recent creatinine 1.68. BMET one week with meds as above.  4. Atrial fibrillation: Paroxysmal.  He tends to tolerate afib poorly.  He is now maintaining NSR after Maze in 12/18.  He is no longer on amiodarone. Stable. - He will continue warfarin rather than DOAC given bioprosthetic valves.  5. COPD: Mild obstruction on PFTs.  He has quit smoking.  Stable. No change.   6. CVA: Remote, minimal  residual. S/p carotid stenting.  - No ASA given warfarin use.  7. Hyperlipidemia: Good lipids in 1/19.  8. Anemia: Fe deficiency.  FOBT negative 8/18.  Got feraheme in 8/18.  Should have eventual GI workup.  - No change. 9. 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  The artery to the PDA was occluded with left to right collaterals.  He had SVG-PDA in 12/18.  - No s/s of ischemia. - Continue statin.   - No ASA given warfarin use.  10. CHB: has Medtronic PPM.  We did not check this visit, will need to check next visit to see how much RV pacing that he is doing.   Meds as above. RTC 3-4 weeks with Pharm-D for med titration. RTC 2 months. Will repeat Echo this summer. OK to use Hot tub, but watch temperature and length (102 for 5 minutes, or < 100 for 10 minutes).  Shirley Friar, PA-C 01/15/2018   Patient seen with PA, agree with the above note.  Symptomatically doing well, NYHA class II.  On exam, he is not volume overloaded.  Mild systolic murmur RUSB.    Echo from 1/19 reviewed, bioprosthetic valves function normally but EF down to 30-35%.  This likely represents his true LV systolic function minus volume loading from severe MR and moderate to severe AI.  Will need to titrate medical treatment to see if we can improve this. - Continue Coreg. - Add losartan 25 mg daily with BMET 10 days.  - Followup in pharmacy clinic for transition to [facility] if BP and creatinine stable and for addition of spironolactone.    Next visit, will need to check Medtronic PPM to see how much RV pacing he is doing.  May need BiV upgrade.   He will start cardiac rehab.   Loralie Champagne 01/17/2018

## 2018-01-15 NOTE — Patient Instructions (Signed)
Start Carvedilol 6.25 mg Twice daily   Start Losartan 25 mg daily at bedtime  Labs in 7-10 days  Your physician recommends that you schedule a follow-up appointment in: 4 weeks with Abbe Amsterdam D  Your physician recommends that you schedule a follow-up appointment in: 2 months with Dr Aundra Dubin

## 2018-01-16 ENCOUNTER — Other Ambulatory Visit: Payer: Self-pay | Admitting: *Deleted

## 2018-01-16 MED ORDER — WARFARIN SODIUM 5 MG PO TABS: 5.0000 mg | ORAL_TABLET | Freq: Every day | ORAL | 3 refills | Status: DC

## 2018-01-22 ENCOUNTER — Ambulatory Visit (INDEPENDENT_AMBULATORY_CARE_PROVIDER_SITE_OTHER): Payer: Medicare HMO

## 2018-01-22 DIAGNOSIS — Z953 Presence of xenogenic heart valve: Secondary | ICD-10-CM

## 2018-01-22 DIAGNOSIS — I480 Paroxysmal atrial fibrillation: Secondary | ICD-10-CM

## 2018-01-22 DIAGNOSIS — Z951 Presence of aortocoronary bypass graft: Secondary | ICD-10-CM | POA: Diagnosis not present

## 2018-01-22 DIAGNOSIS — Z8679 Personal history of other diseases of the circulatory system: Secondary | ICD-10-CM

## 2018-01-22 DIAGNOSIS — Z5181 Encounter for therapeutic drug level monitoring: Secondary | ICD-10-CM

## 2018-01-22 DIAGNOSIS — Z9889 Other specified postprocedural states: Secondary | ICD-10-CM | POA: Diagnosis not present

## 2018-01-22 LAB — POCT INR: INR: 2.4

## 2018-01-22 NOTE — Patient Instructions (Signed)
Description   Continue on same dosage 1 tablet (5mg ) daily.  Recheck INR in 2 weeks. Call with any new medications scheduled procedures or any questions  [phone]

## 2018-01-24 ENCOUNTER — Telehealth (HOSPITAL_COMMUNITY): Payer: Self-pay | Admitting: Pharmacist

## 2018-01-24 NOTE — Telephone Encounter (Signed)
Cardiac Rehab Medication Review by a Pharmacist  Does the patient feel that his/her medications are working for him/her?  yes  Has the patient been experiencing any side effects to the medications prescribed?  no  Does the patient measure his/her own blood pressure or blood glucose at home?  yes - frequently, typically 90-100s/50-60s  Does the patient have any problems obtaining medications due to transportation or finances?   no  Understanding of regimen: good Understanding of indications: good Potential of compliance: good  Pharmacist comments: Patient presents for cardiac rehab orientation. Spoke with patient's wife, who takes care of his medications. She seemed very knowledgeable and was able to easily discuss his medications. She mostly feels as though his medications are working and he is not having side effects, but they are discouraged because the "new mitral valve is only working at 30%." He is also still having stomach issues despite taking Protonix and prn Tums. I suggested speaking with his PCP about potentially adding another acid reducing medication to his regimen, e.g. Zantac.   Mila Merry Gerarda Fraction, PharmD PGY1 Pharmacy Resident Pager: [phone] 01/24/2018 1:36 PM

## 2018-01-25 ENCOUNTER — Ambulatory Visit (HOSPITAL_COMMUNITY)
Admission: RE | Admit: 2018-01-25 | Discharge: 2018-01-25 | Disposition: A | Discharge status: Home / Self Care | Payer: Medicare HMO | Source: Ambulatory Visit | Attending: Internal Medicine | Admitting: Internal Medicine

## 2018-01-25 DIAGNOSIS — I5022 Chronic systolic (congestive) heart failure: Secondary | ICD-10-CM | POA: Insufficient documentation

## 2018-01-25 LAB — BASIC METABOLIC PANEL
Anion gap: 12 (ref 5–15)
BUN: 25 mg/dL — AB (ref 6–20)
CALCIUM: 9.2 mg/dL (ref 8.9–10.3)
CO2: 21 mmol/L — ABNORMAL LOW (ref 22–32)
Chloride: 108 mmol/L (ref 101–111)
Creatinine, Ser: 1.79 mg/dL — ABNORMAL HIGH (ref 0.61–1.24)
GFR calc Af Amer: 42 mL/min — ABNORMAL LOW (ref >60)
GFR, EST NON AFRICAN AMERICAN: 36 mL/min — AB (ref >60)
GLUCOSE: 97 mg/dL (ref 65–99)
POTASSIUM: 4.9 mmol/L (ref 3.5–5.1)
SODIUM: 141 mmol/L (ref 135–145)

## 2018-02-01 ENCOUNTER — Encounter (HOSPITAL_COMMUNITY): Payer: Self-pay

## 2018-02-01 ENCOUNTER — Encounter (HOSPITAL_COMMUNITY)
Admission: RE | Admit: 2018-02-01 | Discharge: 2018-02-01 | Disposition: A | Discharge status: Home / Self Care | Payer: Medicare HMO | Source: Ambulatory Visit | Attending: Internal Medicine | Admitting: Internal Medicine

## 2018-02-01 VITALS — Ht 68.5 in | Wt 242.1 lb

## 2018-02-01 DIAGNOSIS — Z953 Presence of xenogenic heart valve: Secondary | ICD-10-CM | POA: Diagnosis not present

## 2018-02-01 DIAGNOSIS — Z950 Presence of cardiac pacemaker: Secondary | ICD-10-CM

## 2018-02-01 DIAGNOSIS — Z79899 Other long term (current) drug therapy: Secondary | ICD-10-CM | POA: Diagnosis not present

## 2018-02-01 DIAGNOSIS — Z951 Presence of aortocoronary bypass graft: Secondary | ICD-10-CM | POA: Diagnosis not present

## 2018-02-01 DIAGNOSIS — J449 Chronic obstructive pulmonary disease, unspecified: Secondary | ICD-10-CM | POA: Diagnosis not present

## 2018-02-01 DIAGNOSIS — Z79891 Long term (current) use of opiate analgesic: Secondary | ICD-10-CM | POA: Diagnosis not present

## 2018-02-01 DIAGNOSIS — Z87891 Personal history of nicotine dependence: Secondary | ICD-10-CM | POA: Insufficient documentation

## 2018-02-01 DIAGNOSIS — Z8673 Personal history of transient ischemic attack (TIA), and cerebral infarction without residual deficits: Secondary | ICD-10-CM | POA: Diagnosis not present

## 2018-02-01 DIAGNOSIS — Z9889 Other specified postprocedural states: Secondary | ICD-10-CM | POA: Diagnosis not present

## 2018-02-01 DIAGNOSIS — K219 Gastro-esophageal reflux disease without esophagitis: Secondary | ICD-10-CM | POA: Diagnosis not present

## 2018-02-01 DIAGNOSIS — Z952 Presence of prosthetic heart valve: Secondary | ICD-10-CM

## 2018-02-01 NOTE — Progress Notes (Deleted)
Pulmonary Individual Treatment Plan  Patient Details  Name: Brandon [name] MRN: [id number] Date of Birth: 1946-02-15 Referring Provider:    Initial Encounter Date:   Visit Diagnosis: Status post coronary artery stent placement  History of atherectomy  Patient's Home Medications on Admission:   Current Outpatient Medications:  .  acetaminophen (TYLENOL) 325 MG tablet, Take 1 tablet (325 mg total) by mouth every 6 (six) hours as needed for mild pain., Disp: 30 tablet, Rfl: 0 .  calcium carbonate (TUMS - DOSED IN MG ELEMENTAL CALCIUM) 500 MG chewable tablet, Chew 1 tablet by mouth daily as needed for indigestion or heartburn., Disp: , Rfl:  .  carvedilol (COREG) 6.25 MG tablet, Take 1 tablet (6.25 mg total) by mouth 2 (two) times daily., Disp: 60 tablet, Rfl: 6 .  cetirizine (ZYRTEC) 10 MG tablet, Take 10 mg by mouth daily as needed for allergies., Disp: , Rfl:  .  citalopram (CELEXA) 20 MG tablet, Take 1 tablet (20 mg total) by mouth at bedtime., Disp: 30 tablet, Rfl: 5 .  docusate sodium (COLACE) 100 MG capsule, Take 1 capsule (100 mg total) by mouth 3 (three) times daily as needed., Disp: 20 capsule, Rfl: 0 .  ferrous sulfate 325 (65 FE) MG tablet, Take 1 tablet (325 mg total) by mouth daily with breakfast., Disp: 30 tablet, Rfl: 3 .  furosemide (LASIX) 40 MG tablet, Take 40 mg by mouth daily., Disp: , Rfl:  .  LORazepam (ATIVAN) 0.5 MG tablet, Take 1 tablet (0.5 mg total) by mouth every 6 (six) hours as needed for anxiety (aggitation)., Disp: 20 tablet, Rfl: 0 .  losartan (COZAAR) 25 MG tablet, Take 1 tablet (25 mg total) by mouth at bedtime., Disp: 30 tablet, Rfl: 6 .  lovastatin (MEVACOR) 40 MG tablet, TAKE 1 TABLET AT BEDTIME  (NEW  DOSE), Disp: 90 tablet, Rfl: 3 .  ondansetron (ZOFRAN) 4 MG tablet, Take 4 mg by mouth every 8 (eight) hours as needed for nausea or vomiting., Disp: , Rfl:  .  oxyCODONE (OXY IR/ROXICODONE) 5 MG immediate release tablet, Take 1 tablet (5 mg total) by  mouth every 4 (four) hours as needed for severe pain., Disp: 30 tablet, Rfl: 0 .  pantoprazole (PROTONIX) 40 MG tablet, Take 1 tablet (40 mg total) by mouth daily., Disp: 30 tablet, Rfl: 1 .  potassium chloride SA (K-DUR,KLOR-CON) 20 MEQ tablet, Take 20 mEq by mouth daily., Disp: , Rfl:  .  traZODone (DESYREL) 50 MG tablet, Take 1 tablet (50 mg total) by mouth at bedtime., Disp: 30 tablet, Rfl: 5 .  warfarin (COUMADIN) 5 MG tablet, Take 1 tablet (5 mg total) by mouth daily at 6 PM., Disp: 40 tablet, Rfl: 3  Past Medical History: Past Medical History:  Diagnosis Date  . Anemia 07/13/2017  . Anxiety   . Aortic insufficiency   . Aortic stenosis, moderate 07/13/2017  . Arthritis    back   . Carotid stenosis    Right carotid stent (widely patent) 40 - 59% left plaque 11/13  . COPD (chronic obstructive pulmonary disease) ([place])   . Coronary artery disease involving native coronary artery of native heart without angina pectoris   . Depression   . Dyslipidemia   . GERD (gastroesophageal reflux disease)   . Heart murmur   . Hemiplegia affecting unspecified side, late effect of cerebrovascular disease    resolved- from L side   . Hypertension   . Jaundice    resolved following ERCP & Cholecystectomy  .  Mild emphysema ([place])   . Mitral regurgitation   . Mitral valve insufficiency and aortic valve insufficiency   . Myocardial infarction ([place])   . Paroxysmal atrial fibrillation ([facility])   . Pre-diabetes    per spouse  . S/P aortic valve replacement with bioprosthetic valve 11/16/2017   23 mm [facility] Ease stented bovine pericardial tissue valve  . S/P CABG x 1 11/16/2017   SVG to PDA with EVH via right thigh  . S/P [place] operation for atrial fibrillation 11/16/2017   Left side lesion set using bipolar radiofrequency and cryothermy ablation with clipping of LA appendage  . S/P mitral valve replacement with bioprosthetic valve 11/16/2017   27 mm [facility] Mitral stented bovine pericardial  tissue valve  . Sleep apnea    does not wear CPAP  . Sleep concern    resulted in surgery- after + sleep test. Pt. doesn't have a problem any longer.   . Stroke ([place]) 03/11/2003   stent placed on the 31, 3, 2004, L side   . Wears glasses   . Wears hearing aid in both ears   . Wears partial dentures     Tobacco Use: Social History   Tobacco Use  Smoking Status Former Smoker  . Packs/day: 0.50  . Years: 57.00  . Pack years: 28.50  . Types: Cigarettes  . Last attempt to quit: 07/13/1997  . Years since quitting: 20.5  Smokeless Tobacco Never Used    Labs: Recent Review Flowsheet Data    Labs for ITP Cardiac and Pulmonary Rehab Latest Ref Rng & Units 11/16/2017 11/17/2017 11/17/2017 11/17/2017 12/26/2017   Cholestrol 0 - 200 mg/dL - - - - 132   LDLCALC 0 - 99 mg/dL - - - - 66   HDL >40 mg/dL - - - - 47   Trlycerides <150 mg/dL - - - - 97   Hemoglobin A1c 4.8 - 5.6 % - - - - -   PHART 7.350 - 7.450 - 7.381 7.308(L) - -   PCO2ART 32.0 - 48.0 mmHg - 41.1 40.8 - -   HCO3 20.0 - 28.0 mmol/L - 24.4 20.5 - -   TCO2 22 - 32 mmol/L 23 26 22 22  -   ACIDBASEDEF 0.0 - 2.0 mmol/L - 1.0 5.0(H) - -   O2SAT % - 99.0 98.0 - -      Capillary Blood Glucose: Lab Results  Component Value Date   GLUCAP 114 (H) 11/21/2017   GLUCAP 164 (H) 11/20/2017   GLUCAP 99 11/20/2017   GLUCAP 61 (L) 11/20/2017   GLUCAP 168 (H) 11/20/2017     Pulmonary Assessment Scores:   Pulmonary Function Assessment:   Exercise Target Goals:    Exercise Program Goal: Individual exercise prescription set using results from initial 6 min walk test and THRR while considering  patient's activity barriers and safety.    Exercise Prescription Goal: Initial exercise prescription builds to 30-45 minutes a day of aerobic activity, 2-3 days per week.  Home exercise guidelines will be given to patient during program as part of exercise prescription that the participant will acknowledge.  Activity Barriers & Risk  Stratification: Activity Barriers & Cardiac Risk Stratification - 02/01/18 0937      Activity Barriers & Cardiac Risk Stratification   Activity Barriers  Deconditioning;Muscular Weakness;Back Problems;Other (comment)    Comments  L shoulder limitations (RTC); R knee discomfort    Cardiac Risk Stratification  High       6 Minute Walk:  Oxygen Initial Assessment:   Oxygen Re-Evaluation:   Oxygen Discharge (Final Oxygen Re-Evaluation):   Initial Exercise Prescription:   Perform Capillary Blood Glucose checks as needed.  Exercise Prescription Changes:   Exercise Comments:   Exercise Goals and Review: Exercise Goals    Row Name 02/01/18 0937             Exercise Goals   Increase Physical Activity  Yes       Intervention  Provide advice, education, support and counseling about physical activity/exercise needs.;Develop an individualized exercise prescription for aerobic and resistive training based on initial evaluation findings, risk stratification, comorbidities and participant's personal goals.       Expected Outcomes  Short Term: Attend rehab on a regular basis to increase amount of physical activity.;Long Term: Add in home exercise to make exercise part of routine and to increase amount of physical activity.;Long Term: Exercising regularly at least 3-5 days a week.       Increase Strength and Stamina  Yes       Intervention  Provide advice, education, support and counseling about physical activity/exercise needs.;Develop an individualized exercise prescription for aerobic and resistive training based on initial evaluation findings, risk stratification, comorbidities and participant's personal goals.       Expected Outcomes  Short Term: Increase workloads from initial exercise prescription for resistance, speed, and METs.;Short Term: Perform resistance training exercises routinely during rehab and add in resistance training at home;Long Term: Improve cardiorespiratory  fitness, muscular endurance and strength as measured by increased METs and functional capacity (6MWT)       Able to understand and use rate of perceived exertion (RPE) scale  Yes       Intervention  Provide education and explanation on how to use RPE scale       Expected Outcomes  Short Term: Able to use RPE daily in rehab to express subjective intensity level;Long Term:  Able to use RPE to guide intensity level when exercising independently       Knowledge and understanding of Target Heart Rate Range (THRR)  Yes       Intervention  Provide education and explanation of THRR including how the numbers were predicted and where they are located for reference       Expected Outcomes  Short Term: Able to state/look up THRR;Short Term: Able to use daily as guideline for intensity in rehab;Long Term: Able to use THRR to govern intensity when exercising independently       Able to check pulse independently  Yes       Intervention  Provide education and demonstration on how to check pulse in carotid and radial arteries.;Review the importance of being able to check your own pulse for safety during independent exercise       Expected Outcomes  Short Term: Able to explain why pulse checking is important during independent exercise;Long Term: Able to check pulse independently and accurately       Understanding of Exercise Prescription  Yes       Intervention  Provide education, explanation, and written materials on patient's individual exercise prescription       Expected Outcomes  Short Term: Able to explain program exercise prescription;Long Term: Able to explain home exercise prescription to exercise independently          Exercise Goals Re-Evaluation :   Discharge Exercise Prescription (Final Exercise Prescription Changes):   Nutrition:  Target Goals: Understanding of nutrition guidelines, daily intake of sodium 1500mg , cholesterol 200mg , calories  30% from fat and 7% or less from saturated fats, daily  to have 5 or more servings of fruits and vegetables.  Biometrics:    Nutrition Therapy Plan and Nutrition Goals: Nutrition Therapy & Goals - 02/01/18 1113      Nutrition Therapy   Diet  Heart Healthy    Drug/Food Interactions  Coumadin/Vit K      Personal Nutrition Goals   Nutrition Goal  Pt to identify and limit food sources of saturated fat, trans fat, and sodium    Personal Goal #2  Pt to identify food quantities necessary to achieve weight loss of 6-24 lb (2.7-10.9 kg) at graduation from cardiac rehab. Goal wt of 200 lb desired.       Intervention Plan   Intervention  Prescribe, educate and counsel regarding individualized specific dietary modifications aiming towards targeted core components such as weight, hypertension, lipid management, diabetes, heart failure and other comorbidities.    Expected Outcomes  Short Term Goal: Understand basic principles of dietary content, such as calories, fat, sodium, cholesterol and nutrients.;Long Term Goal: Adherence to prescribed nutrition plan.       Nutrition Assessments: Nutrition Assessments - 02/01/18 1113      MEDFICTS Scores   Pre Score  48       Nutrition Goals Re-Evaluation:   Nutrition Goals Discharge (Final Nutrition Goals Re-Evaluation):   Psychosocial: Target Goals: Acknowledge presence or absence of significant depression and/or stress, maximize coping skills, provide positive support system. Participant is able to verbalize types and ability to use techniques and skills needed for reducing stress and depression.  Initial Review & Psychosocial Screening: Initial Psych Review & Screening - 02/01/18 0900      Initial Review   Current issues with  None Identified      Family Dynamics   Good Support System?  Yes wife and family       Barriers   Psychosocial barriers to participate in program  There are no identifiable barriers or psychosocial needs.      Screening Interventions   Interventions  Encouraged to  exercise    Expected Outcomes  Short Term goal: Identification and review with participant of any Quality of Life or Depression concerns found by scoring the questionnaire.;Long Term goal: The participant improves quality of Life and PHQ9 Scores as seen by post scores and/or verbalization of changes       Quality of Life Scores: Quality of Life - 02/01/18 0900      Quality of Life Scores   Health/Function Pre  24.9 %    Socioeconomic Pre  28.19 %    Psych/Spiritual Pre  26.14 %    Family Pre  24 %    GLOBAL Pre  25.77 %      Scores of 19 and below usually indicate a poorer quality of life in these areas.  A difference of  2-3 points is a clinically meaningful difference.  A difference of 2-3 points in the total score of the Quality of Life Index has been associated with significant improvement in overall quality of life, self-image, physical symptoms, and general health in studies assessing change in quality of life.   PHQ-9: Recent Review Flowsheet Data    There is no flowsheet data to display.     Interpretation of Total Score  Total Score Depression Severity:  1-4 = Minimal depression, 5-9 = Mild depression, 10-14 = Moderate depression, 15-19 = Moderately severe depression, 20-27 = Severe depression   Psychosocial Evaluation and Intervention:  Psychosocial Re-Evaluation:   Psychosocial Discharge (Final Psychosocial Re-Evaluation):   Education: Education Goals: Education classes will be provided on a weekly basis, covering required topics. Participant will state understanding/return demonstration of topics presented.  Learning Barriers/Preferences: Learning Barriers/Preferences - 02/01/18 0936      Learning Barriers/Preferences   Learning Barriers  Sight;Hearing glasses; hearing aids    Learning Preferences  Skilled Demonstration;Verbal Instruction;Individual Instruction       Education Topics: Risk Factor Reduction:  -Group instruction that is supported by a  PowerPoint presentation. Instructor discusses the definition of a risk factor, different risk factors for pulmonary disease, and how the heart and lungs work together.     Nutrition for Pulmonary Patient:  -Group instruction provided by PowerPoint slides, verbal discussion, and written materials to support subject matter. The instructor gives an explanation and review of healthy diet recommendations, which includes a discussion on weight management, recommendations for fruit and vegetable consumption, as well as protein, fluid, caffeine, fiber, sodium, sugar, and alcohol. Tips for eating when patients are short of breath are discussed.   Pursed Lip Breathing:  -Group instruction that is supported by demonstration and informational handouts. Instructor discusses the benefits of pursed lip and diaphragmatic breathing and detailed demonstration on how to preform both.     Oxygen Safety:  -Group instruction provided by PowerPoint, verbal discussion, and written material to support subject matter. There is an overview of "What is Oxygen" and "Why do we need it".  Instructor also reviews how to create a safe environment for oxygen use, the importance of using oxygen as prescribed, and the risks of noncompliance. There is a brief discussion on traveling with oxygen and resources the patient may utilize.   Oxygen Equipment:  -Group instruction provided by [facility] Staff utilizing handouts, written materials, and equipment demonstrations.   Signs and Symptoms:  -Group instruction provided by written material and verbal discussion to support subject matter. Warning signs and symptoms of infection, stroke, and heart attack are reviewed and when to call the physician/911 reinforced. Tips for preventing the spread of infection discussed.   Advanced Directives:  -Group instruction provided by verbal instruction and written material to support subject matter. Instructor reviews Advanced Directive laws  and proper instruction for filling out document.   Pulmonary Video:  -Group video education that reviews the importance of medication and oxygen compliance, exercise, good nutrition, pulmonary hygiene, and pursed lip and diaphragmatic breathing for the pulmonary patient.   Exercise for the Pulmonary Patient:  -Group instruction that is supported by a PowerPoint presentation. Instructor discusses benefits of exercise, core components of exercise, frequency, duration, and intensity of an exercise routine, importance of utilizing pulse oximetry during exercise, safety while exercising, and options of places to exercise outside of rehab.     Pulmonary Medications:  -Verbally interactive group education provided by instructor with focus on inhaled medications and proper administration.   Anatomy and Physiology of the Respiratory System and Intimacy:  -Group instruction provided by PowerPoint, verbal discussion, and written material to support subject matter. Instructor reviews respiratory cycle and anatomical components of the respiratory system and their functions. Instructor also reviews differences in obstructive and restrictive respiratory diseases with examples of each. Intimacy, Sex, and Sexuality differences are reviewed with a discussion on how relationships can change when diagnosed with pulmonary disease. Common sexual concerns are reviewed.   MD DAY -A group question and answer session with a medical doctor that allows participants to ask questions that relate to their pulmonary disease state.  OTHER EDUCATION -Group or individual verbal, written, or video instructions that support the educational goals of the pulmonary rehab program.   Holiday Eating Survival Tips:  -Group instruction provided by PowerPoint slides, verbal discussion, and written materials to support subject matter. The instructor gives patients tips, tricks, and techniques to help them not only survive but enjoy  the holidays despite the onslaught of food that accompanies the holidays.   Knowledge Questionnaire Score: Knowledge Questionnaire Score - 02/01/18 0859      Knowledge Questionnaire Score   Pre Score  22/24       Core Components/Risk Factors/Patient Goals at Admission: Personal Goals and Risk Factors at Admission - 02/01/18 0938      Core Components/Risk Factors/Patient Goals on Admission    Weight Management  Obesity;Yes;Weight Maintenance;Weight Loss    Intervention  Weight Management: Develop a combined nutrition and exercise program designed to reach desired caloric intake, while maintaining appropriate intake of nutrient and fiber, sodium and fats, and appropriate energy expenditure required for the weight goal.;Weight Management: Provide education and appropriate resources to help participant work on and attain dietary goals.;Weight Management/Obesity: Establish reasonable short term and long term weight goals.    Admit Weight  242 lb 1 oz (109.8 kg)    Goal Weight: Short Term  230 lb (104.3 kg)    Goal Weight: Long Term  200 lb (90.7 kg)    Expected Outcomes  Short Term: Continue to assess and modify interventions until short term weight is achieved;Long Term: Adherence to nutrition and physical activity/exercise program aimed toward attainment of established weight goal;Weight Maintenance: Understanding of the daily nutrition guidelines, which includes 25-35% calories from fat, 7% or less cal from saturated fats, less than 200mg  cholesterol, less than 1.5gm of sodium, & 5 or more servings of fruits and vegetables daily;Weight Loss: Understanding of general recommendations for a balanced deficit meal plan, which promotes 1-2 lb weight loss per week and includes a negative energy balance of [phone] kcal/d;Understanding recommendations for meals to include 15-35% energy as protein, 25-35% energy from fat, 35-60% energy from carbohydrates, less than 200mg  of dietary cholesterol, 20-35 gm of  total fiber daily;Understanding of distribution of calorie intake throughout the day with the consumption of 4-5 meals/snacks       Core Components/Risk Factors/Patient Goals Review:    Core Components/Risk Factors/Patient Goals at Discharge (Final Review):    ITP Comments: ITP Comments    Row Name 02/01/18 0853           ITP Comments  Dr. Fransico Him, Medical Director           Comments: Patient attended orientation from [phone] to 0945 to review rules and guidelines for program. Completed 6 minute walk test, Intitial ITP, and exercise prescription.  VSS. Telemetry-SR with first degree and small P wave.  Pt asymptomatic and tolerated well with no complaints.  Brief Psychosocial Assessment reveal no immediate barriers to participating in cardiac rehab.  Pt is prior participant in 1999 and is looking forward to returning to cardiac rehab. Cherre Huger, BSN Cardiac and Training and development officer

## 2018-02-01 NOTE — Progress Notes (Signed)
Cardiac Individual Treatment Plan  Patient Details  Name: Brandon [name] MRN: [id number] Date of Birth: [birth year] Referring Provider:     [facility] from 02/01/2018 in [place]  Referring Provider  Wyatt Haste MD and Loralie Champagne MD      Initial Encounter Date:    CARDIAC REHAB PHASE II ORIENTATION from 02/01/2018 in [place]  Date  02/01/18  Referring Provider  Wyatt Haste MD and Loralie Champagne MD      Visit Diagnosis: S/P CABG x 1  S/P AVR (aortic valve replacement)  S/P MVR (mitral valve replacement)  Pacemaker  Patient's Home Medications on Admission:  Current Outpatient Medications:  .  acetaminophen (TYLENOL) 325 MG tablet, Take 1 tablet (325 mg total) by mouth every 6 (six) hours as needed for mild pain., Disp: 30 tablet, Rfl: 0 .  calcium carbonate (TUMS - DOSED IN MG ELEMENTAL CALCIUM) 500 MG chewable tablet, Chew 1 tablet by mouth daily as needed for indigestion or heartburn., Disp: , Rfl:  .  carvedilol (COREG) 6.25 MG tablet, Take 1 tablet (6.25 mg total) by mouth 2 (two) times daily., Disp: 60 tablet, Rfl: 6 .  cetirizine (ZYRTEC) 10 MG tablet, Take 10 mg by mouth daily as needed for allergies., Disp: , Rfl:  .  citalopram (CELEXA) 20 MG tablet, Take 1 tablet (20 mg total) by mouth at bedtime., Disp: 30 tablet, Rfl: 5 .  docusate sodium (COLACE) 100 MG capsule, Take 1 capsule (100 mg total) by mouth 3 (three) times daily as needed., Disp: 20 capsule, Rfl: 0 .  ferrous sulfate 325 (65 FE) MG tablet, Take 1 tablet (325 mg total) by mouth daily with breakfast., Disp: 30 tablet, Rfl: 3 .  furosemide (LASIX) 40 MG tablet, Take 40 mg by mouth daily., Disp: , Rfl:  .  LORazepam (ATIVAN) 0.5 MG tablet, Take 1 tablet (0.5 mg total) by mouth every 6 (six) hours as needed for anxiety (aggitation)., Disp: 20 tablet, Rfl: 0 .  losartan (COZAAR) 25 MG tablet, Take 1 tablet (25 mg total) by  mouth at bedtime., Disp: 30 tablet, Rfl: 6 .  lovastatin (MEVACOR) 40 MG tablet, TAKE 1 TABLET AT BEDTIME  (NEW  DOSE), Disp: 90 tablet, Rfl: 3 .  ondansetron (ZOFRAN) 4 MG tablet, Take 4 mg by mouth every 8 (eight) hours as needed for nausea or vomiting., Disp: , Rfl:  .  oxyCODONE (OXY IR/ROXICODONE) 5 MG immediate release tablet, Take 1 tablet (5 mg total) by mouth every 4 (four) hours as needed for severe pain., Disp: 30 tablet, Rfl: 0 .  pantoprazole (PROTONIX) 40 MG tablet, Take 1 tablet (40 mg total) by mouth daily., Disp: 30 tablet, Rfl: 1 .  potassium chloride SA (K-DUR,KLOR-CON) 20 MEQ tablet, Take 20 mEq by mouth daily., Disp: , Rfl:  .  traZODone (DESYREL) 50 MG tablet, Take 1 tablet (50 mg total) by mouth at bedtime., Disp: 30 tablet, Rfl: 5 .  warfarin (COUMADIN) 5 MG tablet, Take 1 tablet (5 mg total) by mouth daily at 6 PM., Disp: 40 tablet, Rfl: 3  Past Medical History: Past Medical History:  Diagnosis Date  . Anemia 07/13/2017  . Anxiety   . Aortic insufficiency   . Aortic stenosis, moderate 07/13/2017  . Arthritis    back   . Carotid stenosis    Right carotid stent (widely patent) 40 - 59% left plaque 11/13  . COPD (chronic obstructive pulmonary disease) (  [place])   . Coronary artery disease involving native coronary artery of native heart without angina pectoris   . Depression   . Dyslipidemia   . GERD (gastroesophageal reflux disease)   . Heart murmur   . Hemiplegia affecting unspecified side, late effect of cerebrovascular disease    resolved- from L side   . Hypertension   . Jaundice    resolved following ERCP & Cholecystectomy  . Mild emphysema ([place])   . Mitral regurgitation   . Mitral valve insufficiency and aortic valve insufficiency   . Myocardial infarction ([place])   . Paroxysmal atrial fibrillation ([facility])   . Pre-diabetes    per spouse  . S/P aortic valve replacement with bioprosthetic valve 11/16/2017   23 mm [facility] Ease stented bovine pericardial  tissue valve  . S/P CABG x 1 11/16/2017   SVG to PDA with EVH via right thigh  . S/P [place] operation for atrial fibrillation 11/16/2017   Left side lesion set using bipolar radiofrequency and cryothermy ablation with clipping of LA appendage  . S/P mitral valve replacement with bioprosthetic valve 11/16/2017   27 mm [facility] Mitral stented bovine pericardial tissue valve  . Sleep apnea    does not wear CPAP  . Sleep concern    resulted in surgery- after + sleep test. Pt. doesn't have a problem any longer.   . Stroke ([facility]) 03/11/2003   stent placed on the 31, 3, 2004, L side   . Wears glasses   . Wears hearing aid in both ears   . Wears partial dentures     Tobacco Use: Social History   Tobacco Use  Smoking Status Former Smoker  . Packs/day: 0.50  . Years: 57.00  . Pack years: 28.50  . Types: Cigarettes  . Last attempt to quit: 07/13/1997  . Years since quitting: 20.5  Smokeless Tobacco Never Used    Labs: Recent Review Flowsheet Data    Labs for ITP Cardiac and Pulmonary Rehab Latest Ref Rng & Units 11/16/2017 11/17/2017 11/17/2017 11/17/2017 12/26/2017   Cholestrol 0 - 200 mg/dL - - - - 132   LDLCALC 0 - 99 mg/dL - - - - 66   HDL >40 mg/dL - - - - 47   Trlycerides <150 mg/dL - - - - 97   Hemoglobin A1c 4.8 - 5.6 % - - - - -   PHART 7.350 - 7.450 - 7.381 7.308(L) - -   PCO2ART 32.0 - 48.0 mmHg - 41.1 40.8 - -   HCO3 20.0 - 28.0 mmol/L - 24.4 20.5 - -   TCO2 22 - 32 mmol/L 23 26 22 22  -   ACIDBASEDEF 0.0 - 2.0 mmol/L - 1.0 5.0(H) - -   O2SAT % - 99.0 98.0 - -      Capillary Blood Glucose: Lab Results  Component Value Date   GLUCAP 114 (H) 11/21/2017   GLUCAP 164 (H) 11/20/2017   GLUCAP 99 11/20/2017   GLUCAP 61 (L) 11/20/2017   GLUCAP 168 (H) 11/20/2017     Exercise Target Goals: Date: 02/01/18  Exercise Program Goal: Individual exercise prescription set using results from initial 6 min walk test and THRR while considering  patient's activity barriers and  safety.   Exercise Prescription Goal: Initial exercise prescription builds to 30-45 minutes a day of aerobic activity, 2-3 days per week.  Home exercise guidelines will be given to patient during program as part of exercise prescription that the participant will acknowledge.  Activity Barriers &  Risk Stratification: Activity Barriers & Cardiac Risk Stratification - 02/01/18 0937      Activity Barriers & Cardiac Risk Stratification   Activity Barriers  Deconditioning;Muscular Weakness;Back Problems;Other (comment)    Comments  L shoulder limitations (RTC); R knee discomfort    Cardiac Risk Stratification  High       6 Minute Walk: 6 Minute Walk    Row Name 02/01/18 1146         6 Minute Walk   Phase  Initial     Distance  1322 feet     Walk Time  6 minutes     # of Rest Breaks  0     MPH  2.5     METS  1.6     RPE  11     VO2 Peak  5.7     Resting HR  62 bpm     Resting BP  128/70     Resting Oxygen Saturation   96 %     Exercise Oxygen Saturation  during 6 min walk  100 %     Max Ex. HR  92 bpm     Max Ex. BP  128/74     2 Minute Post BP  118/68        Oxygen Initial Assessment:   Oxygen Re-Evaluation:   Oxygen Discharge (Final Oxygen Re-Evaluation):   Initial Exercise Prescription: Initial Exercise Prescription - 02/01/18 1100      Date of Initial Exercise RX and Referring Provider   Date  02/01/18    Referring Provider  Wyatt Haste MD and Loralie Champagne MD      Bike   Level  0.3    Minutes  10    METs  1.51      NuStep   Level  2    SPM  70    Minutes  10    METs  1.5      Track   Laps  7    Minutes  10    METs  2.23      Prescription Details   Frequency (times per week)  3    Duration  Progress to 30 minutes of continuous aerobic without signs/symptoms of physical distress      Intensity   THRR 40-80% of Max Heartrate  60-119    Ratings of Perceived Exertion  11-13    Perceived Dyspnea  0-4      Progression   Progression  Continue to  progress workloads to maintain intensity without signs/symptoms of physical distress.      Resistance Training   Training Prescription  Yes    Weight  2lbs    Reps  10-15       Perform Capillary Blood Glucose checks as needed.  Exercise Prescription Changes:   Exercise Comments:   Exercise Goals and Review: Exercise Goals    Row Name 02/01/18 0937             Exercise Goals   Increase Physical Activity  Yes       Intervention  Provide advice, education, support and counseling about physical activity/exercise needs.;Develop an individualized exercise prescription for aerobic and resistive training based on initial evaluation findings, risk stratification, comorbidities and participant's personal goals.       Expected Outcomes  Short Term: Attend rehab on a regular basis to increase amount of physical activity.;Long Term: Add in home exercise to make exercise part of routine and to increase amount of physical activity.;Long  Term: Exercising regularly at least 3-5 days a week.       Increase Strength and Stamina  Yes       Intervention  Provide advice, education, support and counseling about physical activity/exercise needs.;Develop an individualized exercise prescription for aerobic and resistive training based on initial evaluation findings, risk stratification, comorbidities and participant's personal goals.       Expected Outcomes  Short Term: Increase workloads from initial exercise prescription for resistance, speed, and METs.;Short Term: Perform resistance training exercises routinely during rehab and add in resistance training at home;Long Term: Improve cardiorespiratory fitness, muscular endurance and strength as measured by increased METs and functional capacity (6MWT)       Able to understand and use rate of perceived exertion (RPE) scale  Yes       Intervention  Provide education and explanation on how to use RPE scale       Expected Outcomes  Short Term: Able to use RPE  daily in rehab to express subjective intensity level;Long Term:  Able to use RPE to guide intensity level when exercising independently       Knowledge and understanding of Target Heart Rate Range (THRR)  Yes       Intervention  Provide education and explanation of THRR including how the numbers were predicted and where they are located for reference       Expected Outcomes  Short Term: Able to state/look up THRR;Short Term: Able to use daily as guideline for intensity in rehab;Long Term: Able to use THRR to govern intensity when exercising independently       Able to check pulse independently  Yes       Intervention  Provide education and demonstration on how to check pulse in carotid and radial arteries.;Review the importance of being able to check your own pulse for safety during independent exercise       Expected Outcomes  Short Term: Able to explain why pulse checking is important during independent exercise;Long Term: Able to check pulse independently and accurately       Understanding of Exercise Prescription  Yes       Intervention  Provide education, explanation, and written materials on patient's individual exercise prescription       Expected Outcomes  Short Term: Able to explain program exercise prescription;Long Term: Able to explain home exercise prescription to exercise independently          Exercise Goals Re-Evaluation :    Discharge Exercise Prescription (Final Exercise Prescription Changes):   Nutrition:  Target Goals: Understanding of nutrition guidelines, daily intake of sodium 1500mg , cholesterol 200mg , calories 30% from fat and 7% or less from saturated fats, daily to have 5 or more servings of fruits and vegetables.  Biometrics: Pre Biometrics - 02/01/18 1153      Pre Biometrics   Height  5' 8.5" (1.74 m)    Weight  242 lb 1 oz (109.8 kg)    Waist Circumference  48 inches    Hip Circumference  45 inches    Waist to Hip Ratio  1.07 %    BMI (Calculated)   36.27    Triceps Skinfold  30 mm    % Body Fat  45.2 %    Grip Strength  30 kg    Flexibility  8 in    Single Leg Stand  30 seconds        Nutrition Therapy Plan and Nutrition Goals: Nutrition Therapy & Goals - 02/01/18 1113  Nutrition Therapy   Diet  Heart Healthy    Drug/Food Interactions  Coumadin/Vit K      Personal Nutrition Goals   Nutrition Goal  Pt to identify and limit food sources of saturated fat, trans fat, and sodium    Personal Goal #2  Pt to identify food quantities necessary to achieve weight loss of 6-24 lb (2.7-10.9 kg) at graduation from cardiac rehab. Goal wt of 200 lb desired.       Intervention Plan   Intervention  Prescribe, educate and counsel regarding individualized specific dietary modifications aiming towards targeted core components such as weight, hypertension, lipid management, diabetes, heart failure and other comorbidities.    Expected Outcomes  Short Term Goal: Understand basic principles of dietary content, such as calories, fat, sodium, cholesterol and nutrients.;Long Term Goal: Adherence to prescribed nutrition plan.       Nutrition Assessments: Nutrition Assessments - 02/01/18 1113      MEDFICTS Scores   Pre Score  48       Nutrition Goals Re-Evaluation:   Nutrition Goals Re-Evaluation:   Nutrition Goals Discharge (Final Nutrition Goals Re-Evaluation):   Psychosocial: Target Goals: Acknowledge presence or absence of significant depression and/or stress, maximize coping skills, provide positive support system. Participant is able to verbalize types and ability to use techniques and skills needed for reducing stress and depression.  Initial Review & Psychosocial Screening: Initial Psych Review & Screening - 02/01/18 0900      Initial Review   Current issues with  None Identified      Family Dynamics   Good Support System?  Yes wife and family       Barriers   Psychosocial barriers to participate in program  There are no  identifiable barriers or psychosocial needs.      Screening Interventions   Interventions  Encouraged to exercise    Expected Outcomes  Short Term goal: Identification and review with participant of any Quality of Life or Depression concerns found by scoring the questionnaire.;Long Term goal: The participant improves quality of Life and PHQ9 Scores as seen by post scores and/or verbalization of changes       Quality of Life Scores: Quality of Life - 02/01/18 0900      Quality of Life Scores   Health/Function Pre  24.9 %    Socioeconomic Pre  28.19 %    Psych/Spiritual Pre  26.14 %    Family Pre  24 %    GLOBAL Pre  25.77 %      Scores of 19 and below usually indicate a poorer quality of life in these areas.  A difference of  2-3 points is a clinically meaningful difference.  A difference of 2-3 points in the total score of the Quality of Life Index has been associated with significant improvement in overall quality of life, self-image, physical symptoms, and general health in studies assessing change in quality of life.  PHQ-9: Recent Review Flowsheet Data    There is no flowsheet data to display.     Interpretation of Total Score  Total Score Depression Severity:  1-4 = Minimal depression, 5-9 = Mild depression, 10-14 = Moderate depression, 15-19 = Moderately severe depression, 20-27 = Severe depression   Psychosocial Evaluation and Intervention:   Psychosocial Re-Evaluation:   Psychosocial Discharge (Final Psychosocial Re-Evaluation):   Vocational Rehabilitation: Provide vocational rehab assistance to qualifying candidates.   Vocational Rehab Evaluation & Intervention: Vocational Rehab - 02/01/18 0900      Initial Vocational  Rehab Evaluation & Intervention   Assessment shows need for Vocational Rehabilitation  No retired        Education: Education Goals: Education classes will be provided on a weekly basis, covering required topics. Participant will state  understanding/return demonstration of topics presented.  Learning Barriers/Preferences: Learning Barriers/Preferences - 02/01/18 0936      Learning Barriers/Preferences   Learning Barriers  Sight;Hearing glasses; hearing aids    Learning Preferences  Skilled Demonstration;Verbal Instruction;Individual Instruction       Education Topics: Count Your Pulse:  -Group instruction provided by verbal instruction, demonstration, patient participation and written materials to support subject.  Instructors address importance of being able to find your pulse and how to count your pulse when at home without a heart monitor.  Patients get hands on experience counting their pulse with staff help and individually.   Heart Attack, Angina, and Risk Factor Modification:  -Group instruction provided by verbal instruction, video, and written materials to support subject.  Instructors address signs and symptoms of angina and heart attacks.    Also discuss risk factors for heart disease and how to make changes to improve heart health risk factors.   Functional Fitness:  -Group instruction provided by verbal instruction, demonstration, patient participation, and written materials to support subject.  Instructors address safety measures for doing things around the house.  Discuss how to get up and down off the floor, how to pick things up properly, how to safely get out of a chair without assistance, and balance training.   Meditation and Mindfulness:  -Group instruction provided by verbal instruction, patient participation, and written materials to support subject.  Instructor addresses importance of mindfulness and meditation practice to help reduce stress and improve awareness.  Instructor also leads participants through a meditation exercise.    Stretching for Flexibility and Mobility:  -Group instruction provided by verbal instruction, patient participation, and written materials to support subject.   Instructors lead participants through series of stretches that are designed to increase flexibility thus improving mobility.  These stretches are additional exercise for major muscle groups that are typically performed during regular warm up and cool down.   Hands Only CPR:  -Group verbal, video, and participation provides a basic overview of AHA guidelines for community CPR. Role-play of emergencies allow participants the opportunity to practice calling for help and chest compression technique with discussion of AED use.   Hypertension: -Group verbal and written instruction that provides a basic overview of hypertension including the most recent diagnostic guidelines, risk factor reduction with self-care instructions and medication management.    Nutrition I class: Heart Healthy Eating:  -Group instruction provided by PowerPoint slides, verbal discussion, and written materials to support subject matter. The instructor gives an explanation and review of the Therapeutic Lifestyle Changes diet recommendations, which includes a discussion on lipid goals, dietary fat, sodium, fiber, plant stanol/sterol esters, sugar, and the components of a well-balanced, healthy diet.   Nutrition II class: Lifestyle Skills:  -Group instruction provided by PowerPoint slides, verbal discussion, and written materials to support subject matter. The instructor gives an explanation and review of label reading, grocery shopping for heart health, heart healthy recipe modifications, and ways to make healthier choices when eating out.   Diabetes Question & Answer:  -Group instruction provided by PowerPoint slides, verbal discussion, and written materials to support subject matter. The instructor gives an explanation and review of diabetes co-morbidities, pre- and post-prandial blood glucose goals, pre-exercise blood glucose goals, signs, symptoms, and treatment of  hypoglycemia and hyperglycemia, and foot care  basics.   Diabetes Blitz:  -Group instruction provided by PowerPoint slides, verbal discussion, and written materials to support subject matter. The instructor gives an explanation and review of the physiology behind type 1 and type 2 diabetes, diabetes medications and rational behind using different medications, pre- and post-prandial blood glucose recommendations and Hemoglobin A1c goals, diabetes diet, and exercise including blood glucose guidelines for exercising safely.    Portion Distortion:  -Group instruction provided by PowerPoint slides, verbal discussion, written materials, and food models to support subject matter. The instructor gives an explanation of serving size versus portion size, changes in portions sizes over the last 20 years, and what consists of a serving from each food group.   Stress Management:  -Group instruction provided by verbal instruction, video, and written materials to support subject matter.  Instructors review role of stress in heart disease and how to cope with stress positively.     Exercising on Your Own:  -Group instruction provided by verbal instruction, power point, and written materials to support subject.  Instructors discuss benefits of exercise, components of exercise, frequency and intensity of exercise, and end points for exercise.  Also discuss use of nitroglycerin and activating EMS.  Review options of places to exercise outside of rehab.  Review guidelines for sex with heart disease.   Cardiac Drugs I:  -Group instruction provided by verbal instruction and written materials to support subject.  Instructor reviews cardiac drug classes: antiplatelets, anticoagulants, beta blockers, and statins.  Instructor discusses reasons, side effects, and lifestyle considerations for each drug class.   Cardiac Drugs II:  -Group instruction provided by verbal instruction and written materials to support subject.  Instructor reviews cardiac drug classes:  angiotensin converting enzyme inhibitors (ACE-I), angiotensin II receptor blockers (ARBs), nitrates, and calcium channel blockers.  Instructor discusses reasons, side effects, and lifestyle considerations for each drug class.   Anatomy and Physiology of the Circulatory System:  Group verbal and written instruction and models provide basic cardiac anatomy and physiology, with the coronary electrical and arterial systems. Review of: AMI, Angina, Valve disease, Heart Failure, Peripheral Artery Disease, Cardiac Arrhythmia, Pacemakers, and the ICD.   Other Education:  -Group or individual verbal, written, or video instructions that support the educational goals of the cardiac rehab program.   Holiday Eating Survival Tips:  -Group instruction provided by PowerPoint slides, verbal discussion, and written materials to support subject matter. The instructor gives patients tips, tricks, and techniques to help them not only survive but enjoy the holidays despite the onslaught of food that accompanies the holidays.   Knowledge Questionnaire Score: Knowledge Questionnaire Score - 02/01/18 0859      Knowledge Questionnaire Score   Pre Score  22/24       Core Components/Risk Factors/Patient Goals at Admission: Personal Goals and Risk Factors at Admission - 02/01/18 1154      Core Components/Risk Factors/Patient Goals on Admission   Tobacco Cessation  Yes quit date 07/2017    Number of packs per day  1/2 ppd x 50 years    Intervention  Assist the participant in steps to quit. Provide individualized education and counseling about committing to Tobacco Cessation, relapse prevention, and pharmacological support that can be provided by physician.;Advice worker, assist with locating and accessing local/national Quit Smoking programs, and support quit date choice.    Expected Outcomes  Short Term: Will demonstrate readiness to quit, by selecting a quit date.;Short Term: Will quit all tobacco  product use, adhering to prevention of relapse plan.;Long Term: Complete abstinence from all tobacco products for at least 12 months from quit date.    Heart Failure  Yes    Intervention  Provide a combined exercise and nutrition program that is supplemented with education, support and counseling about heart failure. Directed toward relieving symptoms such as shortness of breath, decreased exercise tolerance, and extremity edema.    Expected Outcomes  Improve functional capacity of life;Short term: Attendance in program 2-3 days a week with increased exercise capacity. Reported lower sodium intake. Reported increased fruit and vegetable intake. Reports medication compliance.;Short term: Daily weights obtained and reported for increase. Utilizing diuretic protocols set by physician.;Long term: Adoption of self-care skills and reduction of barriers for early signs and symptoms recognition and intervention leading to self-care maintenance.    Hypertension  Yes    Intervention  Provide education on lifestyle modifcations including regular physical activity/exercise, weight management, moderate sodium restriction and increased consumption of fresh fruit, vegetables, and low fat dairy, alcohol moderation, and smoking cessation.;Monitor prescription use compliance.    Expected Outcomes  Short Term: Continued assessment and intervention until BP is < 140/42mm HG in hypertensive participants. < 130/25mm HG in hypertensive participants with diabetes, heart failure or chronic kidney disease.;Long Term: Maintenance of blood pressure at goal levels.    Lipids  Yes    Intervention  Provide education and support for participant on nutrition & aerobic/resistive exercise along with prescribed medications to achieve LDL 70mg , HDL >40mg .    Expected Outcomes  Short Term: Participant states understanding of desired cholesterol values and is compliant with medications prescribed. Participant is following exercise prescription  and nutrition guidelines.;Long Term: Cholesterol controlled with medications as prescribed, with individualized exercise RX and with personalized nutrition plan. Value goals: LDL < 70mg , HDL > 40 mg.       Core Components/Risk Factors/Patient Goals Review:    Core Components/Risk Factors/Patient Goals at Discharge (Final Review):    ITP Comments: ITP Comments    Row Name 02/01/18 0853           ITP Comments  Dr. Fransico Him, Medical Director           Comments:  Patient attended orientation from 0831 to 1002 to review rules and guidelines for program. Completed 6 minute walk test, Intitial ITP, and exercise prescription.  VSS. Telemetry-Ventricular Paced. Pt is asymptomatic and tolerated will used the wheelchair for support.  Brief Psychosocial assessment reveal no immediate barriers to participating in cardiac rehab.  Pt feels supported by his wife who brought him today for his appt. Cherre Huger, BSN Cardiac and Training and development officer

## 2018-02-01 NOTE — Progress Notes (Signed)
Brandon [name] 72 y.o. male DOB: 12/23/1945 MRN: [id number]      Nutrition Note  Dx: CABG, AVR, MVR with MAZE and LA clipping Past Medical History:  Diagnosis Date  . Anemia 07/13/2017  . Anxiety   . Aortic insufficiency   . Aortic stenosis, moderate 07/13/2017  . Arthritis    back   . Carotid stenosis    Right carotid stent (widely patent) 40 - 59% left plaque 11/13  . COPD (chronic obstructive pulmonary disease) ([place])   . Coronary artery disease involving native coronary artery of native heart without angina pectoris   . Depression   . Dyslipidemia   . GERD (gastroesophageal reflux disease)   . Heart murmur   . Hemiplegia affecting unspecified side, late effect of cerebrovascular disease    resolved- from L side   . Hypertension   . Jaundice    resolved following ERCP & Cholecystectomy  . Mild emphysema ([place])   . Mitral regurgitation   . Mitral valve insufficiency and aortic valve insufficiency   . Myocardial infarction ([place])   . Paroxysmal atrial fibrillation ([facility])   . Pre-diabetes    per spouse  . S/P aortic valve replacement with bioprosthetic valve 11/16/2017   23 mm [facility] Ease stented bovine pericardial tissue valve  . S/P CABG x 1 11/16/2017   SVG to PDA with EVH via right thigh  . S/P Maze operation for atrial fibrillation 11/16/2017   Left side lesion set using bipolar radiofrequency and cryothermy ablation with clipping of LA appendage  . S/P mitral valve replacement with bioprosthetic valve 11/16/2017   27 mm [facility] Mitral stented bovine pericardial tissue valve  . Sleep apnea    does not wear CPAP  . Sleep concern    resulted in surgery- after + sleep test. Pt. doesn't have a problem any longer.   . Stroke ([place]) 03/11/2003   stent placed on the 31, 3, 2004, L side   . Wears glasses   . Wears hearing aid in both ears   . Wears partial dentures    Meds reviewed. Coumadin noted  HT: Ht Readings from Last 1 Encounters:  12/25/17 5\' 9"  (1.753 m)     WT: Wt Readings from Last 3 Encounters:  01/15/18 236 lb 1.9 oz (107.1 kg)  12/26/17 235 lb (106.6 kg)  12/25/17 233 lb (105.7 kg)     BMI 34.39   Current tobacco use? No  Labs:  Lipid Panel     Component Value Date/Time   CHOL 132 12/26/2017 0935   CHOL 120 12/27/2012 0912   TRIG 97 12/26/2017 0935   TRIG 115 12/27/2012 0912   HDL 47 12/26/2017 0935   HDL 40 12/27/2012 0912   CHOLHDL 2.8 12/26/2017 0935   VLDL 19 12/26/2017 0935   LDLCALC 66 12/26/2017 0935   LDLCALC 57 12/27/2012 0912    Lab Results  Component Value Date   HGBA1C 6.0 (H) 11/13/2017   CBG (last 3)  No results for input(s): GLUCAP in the last 72 hours.  Nutrition Note Spoke with pt. Nutrition plan and goals reviewed with pt. Pt is following Step 1 of the Therapeutic Lifestyle Changes diet. Pt wants to lose wt. Wt loss tips reviewed. Pt is on Coumadin. Per discussion, pt is aware for the need to monitor his vitamin K intake. Pt noted to be pre-diabetic according to his last A1c. Pt expressed understanding of the information reviewed. Pt aware of nutrition education classes offered and plans on  attending nutrition classes.  Nutrition Diagnosis ? Food-and nutrition-related knowledge deficit related to lack of exposure to information as related to diagnosis of: ? CVD ? Pre-DM ? Obesity related to excessive energy intake as evidenced by a BMI of 34.39  Nutrition Intervention ? Pt's individual nutrition plan and goals reviewed with pt. ? Pt given handouts for: ? Nutrition I class ? Nutrition II class   Nutrition Goal(s):  ? Pt to identify and limit food sources of saturated fat, trans fat, and sodium ? Pt to identify food quantities necessary to achieve weight loss of 6-24 lb (2.7-10.9 kg) at graduation from cardiac rehab. Goal wt of 200 lb desired.   Plan:  Pt to attend nutrition classes ? Nutrition I ? Nutrition II ? Portion Distortion  Will provide client-centered nutrition education as part of  interdisciplinary care.   Monitor and evaluate progress toward nutrition goal with team.  Derek Mound, M.Ed, RD, LDN, CDE 02/01/2018 10:38 AM

## 2018-02-05 ENCOUNTER — Ambulatory Visit (INDEPENDENT_AMBULATORY_CARE_PROVIDER_SITE_OTHER): Payer: Medicare HMO | Admitting: *Deleted

## 2018-02-05 ENCOUNTER — Encounter (HOSPITAL_COMMUNITY)
Admission: RE | Admit: 2018-02-05 | Discharge: 2018-02-05 | Disposition: A | Discharge status: Home / Self Care | Payer: Medicare HMO | Source: Ambulatory Visit | Attending: Internal Medicine | Admitting: Internal Medicine

## 2018-02-05 DIAGNOSIS — Z5181 Encounter for therapeutic drug level monitoring: Secondary | ICD-10-CM

## 2018-02-05 DIAGNOSIS — Z952 Presence of prosthetic heart valve: Secondary | ICD-10-CM

## 2018-02-05 DIAGNOSIS — Z951 Presence of aortocoronary bypass graft: Secondary | ICD-10-CM

## 2018-02-05 DIAGNOSIS — Z8679 Personal history of other diseases of the circulatory system: Secondary | ICD-10-CM

## 2018-02-05 DIAGNOSIS — K219 Gastro-esophageal reflux disease without esophagitis: Secondary | ICD-10-CM | POA: Diagnosis not present

## 2018-02-05 DIAGNOSIS — Z79899 Other long term (current) drug therapy: Secondary | ICD-10-CM | POA: Diagnosis not present

## 2018-02-05 DIAGNOSIS — I480 Paroxysmal atrial fibrillation: Secondary | ICD-10-CM

## 2018-02-05 DIAGNOSIS — Z9889 Other specified postprocedural states: Secondary | ICD-10-CM | POA: Diagnosis not present

## 2018-02-05 DIAGNOSIS — Z79891 Long term (current) use of opiate analgesic: Secondary | ICD-10-CM | POA: Diagnosis not present

## 2018-02-05 DIAGNOSIS — J449 Chronic obstructive pulmonary disease, unspecified: Secondary | ICD-10-CM | POA: Diagnosis not present

## 2018-02-05 DIAGNOSIS — Z953 Presence of xenogenic heart valve: Secondary | ICD-10-CM | POA: Diagnosis not present

## 2018-02-05 DIAGNOSIS — Z8673 Personal history of transient ischemic attack (TIA), and cerebral infarction without residual deficits: Secondary | ICD-10-CM | POA: Diagnosis not present

## 2018-02-05 DIAGNOSIS — Z87891 Personal history of nicotine dependence: Secondary | ICD-10-CM | POA: Diagnosis not present

## 2018-02-05 LAB — POCT INR: INR: 1.8

## 2018-02-05 NOTE — Progress Notes (Signed)
Daily Session Note  Patient Details  Name: Brandon [name] MRN: [id number] Date of Birth: Feb 18, 1946 Referring Provider:     CARDIAC REHAB PHASE II ORIENTATION from 02/01/2018 in [place]  Referring Provider  Wyatt Haste MD and Loralie Champagne MD      Encounter Date: 02/05/2018  Check In: Session Check In - 02/05/18 1207      Check-In   Location  [facility]    Staff Present  Andi Hence, RN, Marga Melnick, RN, Mosie Epstein, MS,ACSM CEP, Exercise Physiologist;Other    Supervising physician immediately available to respond to emergencies  [facility] immediately available    Physician(s)  Dr. Horris Latino     Medication changes reported      No    Fall or balance concerns reported     No    Tobacco Cessation  No Change    Warm-up and Cool-down  Performed as group-led instruction    Resistance Training Performed  Yes    VAD Patient?  No      Pain Assessment   Currently in Pain?  No/denies       Capillary Blood Glucose: No results found for this or any previous visit (from the past 24 hour(s)).  Exercise Prescription Changes - 02/05/18 1600      Response to Exercise   Blood Pressure (Admit)  104/70    Blood Pressure (Exercise)  126/70    Blood Pressure (Exit)  120/64    Heart Rate (Admit)  75 bpm    Heart Rate (Exercise)  98 bpm    Heart Rate (Exit)  75 bpm    Rating of Perceived Exertion (Exercise)  11    Perceived Dyspnea (Exercise)  0    Symptoms  None     Comments  Pt oriented to exericse equipment    Duration  Progress to 30 minutes of  aerobic without signs/symptoms of physical distress    Intensity  THRR New      Progression   Progression  Continue to progress workloads to maintain intensity without signs/symptoms of physical distress.    Average METs  2.03      Resistance Training   Training Prescription  Yes    Weight  2lbs    Reps  10-15    Time  10 Minutes      Interval Training   Interval  Training  No      Bike   Level  0.3    Minutes  10    METs  1.52      NuStep   Level  2    SPM  75    Minutes  10    METs  2      Track   Laps  9    Minutes  10    METs  2.53       Social History   Tobacco Use  Smoking Status Former Smoker  . Packs/day: 0.50  . Years: 57.00  . Pack years: 28.50  . Types: Cigarettes  . Last attempt to quit: 07/13/1997  . Years since quitting: 20.5  Smokeless Tobacco Never Used    Goals Met:  No report of cardiac concerns or symptoms  Goals Unmet:  Not Applicable  Comments: Xzander started cardiac rehab today.  Pt tolerated light exercise without difficulty. VSS, telemetry-V paced on demand, asymptomatic.  Medication list reconciled. Pt denies barriers to medicaiton compliance.  PSYCHOSOCIAL ASSESSMENT:  PHQ-0. Pt exhibits positive  coping skills, hopeful outlook with supportive family. No psychosocial needs identified at this time, no psychosocial interventions necessary.    Pt enjoys fishing and hunting and working in his shop.   Pt oriented to exercise equipment and routine.    Understanding verbalized.   Dr. Fransico Him is Medical Director for Cardiac Rehab at [facility].

## 2018-02-05 NOTE — Patient Instructions (Signed)
Description   Today take 1.5 tablets, then change your  Dosage to 1 tablet (5mg ) daily except 1.5 tablets on Mondays.   Recheck INR in 2 weeks. Call with any new medications scheduled procedures or any questions  [phone]

## 2018-02-07 ENCOUNTER — Encounter (HOSPITAL_COMMUNITY)
Admission: RE | Admit: 2018-02-07 | Discharge: 2018-02-07 | Disposition: A | Discharge status: Home / Self Care | Payer: Medicare HMO | Source: Ambulatory Visit | Attending: Internal Medicine | Admitting: Internal Medicine

## 2018-02-07 DIAGNOSIS — Z950 Presence of cardiac pacemaker: Secondary | ICD-10-CM

## 2018-02-07 DIAGNOSIS — Z951 Presence of aortocoronary bypass graft: Secondary | ICD-10-CM | POA: Diagnosis not present

## 2018-02-07 DIAGNOSIS — Z9889 Other specified postprocedural states: Secondary | ICD-10-CM | POA: Diagnosis not present

## 2018-02-07 DIAGNOSIS — J449 Chronic obstructive pulmonary disease, unspecified: Secondary | ICD-10-CM | POA: Diagnosis not present

## 2018-02-07 DIAGNOSIS — Z87891 Personal history of nicotine dependence: Secondary | ICD-10-CM | POA: Diagnosis not present

## 2018-02-07 DIAGNOSIS — Z79899 Other long term (current) drug therapy: Secondary | ICD-10-CM | POA: Diagnosis not present

## 2018-02-07 DIAGNOSIS — Z952 Presence of prosthetic heart valve: Secondary | ICD-10-CM

## 2018-02-07 DIAGNOSIS — Z953 Presence of xenogenic heart valve: Secondary | ICD-10-CM | POA: Diagnosis not present

## 2018-02-07 DIAGNOSIS — Z8673 Personal history of transient ischemic attack (TIA), and cerebral infarction without residual deficits: Secondary | ICD-10-CM | POA: Diagnosis not present

## 2018-02-07 DIAGNOSIS — Z79891 Long term (current) use of opiate analgesic: Secondary | ICD-10-CM | POA: Diagnosis not present

## 2018-02-07 DIAGNOSIS — K219 Gastro-esophageal reflux disease without esophagitis: Secondary | ICD-10-CM | POA: Diagnosis not present

## 2018-02-09 ENCOUNTER — Encounter (HOSPITAL_COMMUNITY)
Admission: RE | Admit: 2018-02-09 | Discharge: 2018-02-09 | Disposition: A | Discharge status: Home / Self Care | Payer: Medicare HMO | Source: Ambulatory Visit | Attending: Internal Medicine | Admitting: Internal Medicine

## 2018-02-09 ENCOUNTER — Telehealth (HOSPITAL_COMMUNITY): Payer: Self-pay | Admitting: *Deleted

## 2018-02-09 DIAGNOSIS — Z951 Presence of aortocoronary bypass graft: Secondary | ICD-10-CM | POA: Insufficient documentation

## 2018-02-09 DIAGNOSIS — Z953 Presence of xenogenic heart valve: Secondary | ICD-10-CM | POA: Insufficient documentation

## 2018-02-09 DIAGNOSIS — Z8673 Personal history of transient ischemic attack (TIA), and cerebral infarction without residual deficits: Secondary | ICD-10-CM | POA: Insufficient documentation

## 2018-02-09 DIAGNOSIS — Z79891 Long term (current) use of opiate analgesic: Secondary | ICD-10-CM | POA: Diagnosis not present

## 2018-02-09 DIAGNOSIS — Z79899 Other long term (current) drug therapy: Secondary | ICD-10-CM | POA: Diagnosis not present

## 2018-02-09 DIAGNOSIS — K219 Gastro-esophageal reflux disease without esophagitis: Secondary | ICD-10-CM | POA: Diagnosis not present

## 2018-02-09 DIAGNOSIS — Z87891 Personal history of nicotine dependence: Secondary | ICD-10-CM | POA: Diagnosis not present

## 2018-02-09 DIAGNOSIS — J449 Chronic obstructive pulmonary disease, unspecified: Secondary | ICD-10-CM | POA: Diagnosis not present

## 2018-02-09 DIAGNOSIS — Z9889 Other specified postprocedural states: Secondary | ICD-10-CM | POA: Diagnosis not present

## 2018-02-09 DIAGNOSIS — Z952 Presence of prosthetic heart valve: Secondary | ICD-10-CM

## 2018-02-09 NOTE — Progress Notes (Signed)
Post exercise blood pressure 82/50. Patient asymptomatic but reports having some low blood pressures at home. Patient given Gatorade. Recheck  sitting blood pressure 110/74 sitting. Standing blood pressure 127/83. Heather Schub RN at the heart failure clinic called and notified. Will fax exercise flow sheets to Dr. Claris Gladden office for review. Aime left cardiac rehab without complaints or symptoms.Will continue to monitor the patient throughout  the program. Barnet Pall, RN,BSN 02/12/2018 12:34 PM

## 2018-02-09 NOTE — Telephone Encounter (Signed)
LATE ENTRY:  Verdis Frederickson called earlier this afternoon to report pt's BP was in the 80s, she states pt was asymptomatic, she gave him Gatorade and BP improved to 110/70 sitting and rose to 128/70 with standing.  Again pt is asymptomatic, denies any dizziness/lightheadedness.  She will fax readings for Dr Claris Gladden review.

## 2018-02-12 ENCOUNTER — Encounter (HOSPITAL_COMMUNITY)
Admission: RE | Admit: 2018-02-12 | Discharge: 2018-02-12 | Disposition: A | Discharge status: Home / Self Care | Payer: Medicare HMO | Source: Ambulatory Visit | Attending: Internal Medicine | Admitting: Internal Medicine

## 2018-02-12 DIAGNOSIS — Z79891 Long term (current) use of opiate analgesic: Secondary | ICD-10-CM | POA: Diagnosis not present

## 2018-02-12 DIAGNOSIS — Z79899 Other long term (current) drug therapy: Secondary | ICD-10-CM | POA: Diagnosis not present

## 2018-02-12 DIAGNOSIS — Z87891 Personal history of nicotine dependence: Secondary | ICD-10-CM | POA: Diagnosis not present

## 2018-02-12 DIAGNOSIS — K219 Gastro-esophageal reflux disease without esophagitis: Secondary | ICD-10-CM | POA: Diagnosis not present

## 2018-02-12 DIAGNOSIS — Z953 Presence of xenogenic heart valve: Secondary | ICD-10-CM | POA: Diagnosis not present

## 2018-02-12 DIAGNOSIS — Z951 Presence of aortocoronary bypass graft: Secondary | ICD-10-CM

## 2018-02-12 DIAGNOSIS — Z950 Presence of cardiac pacemaker: Secondary | ICD-10-CM

## 2018-02-12 DIAGNOSIS — Z952 Presence of prosthetic heart valve: Secondary | ICD-10-CM

## 2018-02-12 DIAGNOSIS — Z8673 Personal history of transient ischemic attack (TIA), and cerebral infarction without residual deficits: Secondary | ICD-10-CM | POA: Diagnosis not present

## 2018-02-12 DIAGNOSIS — J449 Chronic obstructive pulmonary disease, unspecified: Secondary | ICD-10-CM | POA: Diagnosis not present

## 2018-02-12 DIAGNOSIS — Z9889 Other specified postprocedural states: Secondary | ICD-10-CM | POA: Diagnosis not present

## 2018-02-12 NOTE — Progress Notes (Signed)
Brandon [name] 72 y.o. male DOB: February 25, 1946 MRN: [id number]      Nutrition Note  Dx: CABG, AVR, MVR with MAZE and LA clipping  Meds reviewed. Coumadin noted  Lab Results  Component Value Date   HGBA1C 6.0 (H) 11/13/2017   Nutrition Note Spoke with pt. Nutrition plan and survey reviewed with pt. Pt is following Step 1 of the Therapeutic Lifestyle Changes diet. Pt wants to lose wt. Pt reports he has been losing "about 1 lb a week." Pt is on Coumadin. Per discussion, pt is aware for the need to monitor his vitamin K intake. Pt noted to be pre-diabetic according to his last A1c. Pt previously given nutrition class handouts. Pt states he's skimmed through the handouts but has not read through them yet. Pt encouraged to ask this Probation officer questions re: diet if they arise. Pt expressed understanding of the information reviewed. Pt aware of nutrition education classes offered.  Nutrition Diagnosis ? Food-and nutrition-related knowledge deficit related to lack of exposure to information as related to diagnosis of: ? CVD ? Pre-DM ? Obesity related to excessive energy intake as evidenced by a BMI of 34.39  Nutrition Intervention ? Pt's individual nutrition plan and goals reviewed with pt. ? Benefits of adopting a Heart Healthy diet discussed when MEDFICTS nutrition survey reviewed.   Nutrition Goal(s):  ? Pt to identify and limit food sources of saturated fat, trans fat, and sodium ? Pt to identify food quantities necessary to achieve weight loss of 6-24 lb (2.7-10.9 kg) at graduation from cardiac rehab. Goal wt of 200 lb desired.   Plan:  Pt to attend nutrition classes ? Nutrition I ? Nutrition II ? Portion Distortion  Will provide client-centered nutrition education as part of interdisciplinary care.   Monitor and evaluate progress toward nutrition goal with team.  Derek Mound, M.Ed, RD, LDN, CDE 02/12/2018 11:46 AM

## 2018-02-13 ENCOUNTER — Ambulatory Visit (HOSPITAL_COMMUNITY)
Admission: RE | Admit: 2018-02-13 | Discharge: 2018-02-13 | Disposition: A | Discharge status: Home / Self Care | Payer: Medicare HMO | Source: Ambulatory Visit | Attending: Cardiology | Admitting: Cardiology

## 2018-02-13 ENCOUNTER — Other Ambulatory Visit (HOSPITAL_COMMUNITY): Payer: Self-pay | Admitting: Pharmacist

## 2018-02-13 VITALS — BP 138/88 | HR 73 | Wt 243.2 lb

## 2018-02-13 DIAGNOSIS — Z7901 Long term (current) use of anticoagulants: Secondary | ICD-10-CM | POA: Diagnosis not present

## 2018-02-13 DIAGNOSIS — J449 Chronic obstructive pulmonary disease, unspecified: Secondary | ICD-10-CM | POA: Insufficient documentation

## 2018-02-13 DIAGNOSIS — I5022 Chronic systolic (congestive) heart failure: Secondary | ICD-10-CM | POA: Insufficient documentation

## 2018-02-13 DIAGNOSIS — E785 Hyperlipidemia, unspecified: Secondary | ICD-10-CM | POA: Insufficient documentation

## 2018-02-13 DIAGNOSIS — Z87891 Personal history of nicotine dependence: Secondary | ICD-10-CM | POA: Diagnosis not present

## 2018-02-13 DIAGNOSIS — N183 Chronic kidney disease, stage 3 (moderate): Secondary | ICD-10-CM | POA: Diagnosis not present

## 2018-02-13 DIAGNOSIS — Z8673 Personal history of transient ischemic attack (TIA), and cerebral infarction without residual deficits: Secondary | ICD-10-CM | POA: Insufficient documentation

## 2018-02-13 DIAGNOSIS — I2510 Atherosclerotic heart disease of native coronary artery without angina pectoris: Secondary | ICD-10-CM | POA: Insufficient documentation

## 2018-02-13 DIAGNOSIS — I352 Nonrheumatic aortic (valve) stenosis with insufficiency: Secondary | ICD-10-CM | POA: Diagnosis not present

## 2018-02-13 DIAGNOSIS — D509 Iron deficiency anemia, unspecified: Secondary | ICD-10-CM | POA: Insufficient documentation

## 2018-02-13 DIAGNOSIS — I480 Paroxysmal atrial fibrillation: Secondary | ICD-10-CM | POA: Diagnosis not present

## 2018-02-13 LAB — BASIC METABOLIC PANEL
ANION GAP: 8 (ref 5–15)
BUN: 20 mg/dL (ref 6–20)
CO2: 23 mmol/L (ref 22–32)
Calcium: 9 mg/dL (ref 8.9–10.3)
Chloride: 108 mmol/L (ref 101–111)
Creatinine, Ser: 1.54 mg/dL — ABNORMAL HIGH (ref 0.61–1.24)
GFR, EST AFRICAN AMERICAN: 51 mL/min — AB (ref >60)
GFR, EST NON AFRICAN AMERICAN: 44 mL/min — AB (ref >60)
Glucose, Bld: 115 mg/dL — ABNORMAL HIGH (ref 65–99)
POTASSIUM: 4.3 mmol/L (ref 3.5–5.1)
SODIUM: 139 mmol/L (ref 135–145)

## 2018-02-13 MED ORDER — SACUBITRIL-VALSARTAN 24-26 MG PO TABS: 1.0000 | ORAL_TABLET | Freq: Two times a day (BID) | ORAL | 5 refills | Status: DC

## 2018-02-13 MED ORDER — CARVEDILOL 6.25 MG PO TABS: 6.2500 mg | ORAL_TABLET | Freq: Two times a day (BID) | ORAL | 6 refills | Status: DC

## 2018-02-13 MED FILL — CARVEDILOL 6.25 MG TABLET: 6.25 | 30 days supply | Qty: 60 | Fill #0

## 2018-02-13 NOTE — Patient Instructions (Addendum)
It was great to meet you today!  Please STOP losartan.   Please START Entresto 24-26 mg (1 TABLET) TWICE DAILY.   Blood work today. We will call you with any changes.   You are scheduled with the pharmacist, Doroteo Bradford, again in 2 weeks on 02/27/18 at 10:00 AM.   Please keep your appointment with Dr. Aundra Dubin on 03/15/18.

## 2018-02-13 NOTE — Progress Notes (Signed)
HF MD: [facility]  HPI:  72 yo Caucasian M with history of CVA, paroxysmal atrial fibrillation, valvular heart disease, and chronic diastolic CHF presents for evaluation of CHF and valvular heart disease.    He had had episodes of dyspnea and initial workup led to a TEE in 5/18 showing normal EF with moderate AS and moderate MR.  He continued to have episodic dyspnea and ended up admitted in 8/18 with shortness of breath and chest pressure.  This led to a long, complicated hospitalization.  He was noted to be volume overloaded with acute diastolic CHF and was also noted to have symptomatic runs of atrial fibrillation with RVR.  Amiodarone was started to control the atrial fibrillation.  He developed respiratory distress => Bipap => intubated.  He became hypotensive, requiring pressors.  He developed AKI.  PNA was noted and he received broad spectrum abx.  He had a prolonged intubation but was eventually extubated.  Given deconditioning from long hospitalization, he went to CIR for a couple of weeks.  LHC in 9/18 showed occluded PDA with left>right collaterals.  TEE in 9/18 showed EF 50%, severe MR (possibly infarct-related with restricted posterior leaflet, and moderate AS + moderate-severe AI (possibly rheumatic).   In 12/18, he had cardiac surgery with bioprosthetic aortic and mitral valves placed.  He had SVG-PDA, Maze, and LA appendage clipping.  Post-op course was complicated by CHB requiring Medtronic dual chamber PPM.  Echo 01/02/18 LVEF 30-35%, bioprosthetic MV and AoV function normally, RV mildly dilated with normal systolic function.    He returns today for pharmacist-led HF medication titration. At last HF clinic visit on 01/15/18, he was started on carvedilol at 6.25 mg BID and losartan 25 mg daily QHS. He is doing well overall. He has started CR 3 days a week. He denies DOE, lightheadedness or dizziness. No orthopnea or PND. No palpitations or CP.     [name] Shortness of breath/dyspnea on exertion?  no  . Orthopnea/PND? no . Edema? no . Lightheadedness/dizziness? no . Daily weights at home? Yes - stable ~237 lb  . Blood pressure/heart rate monitoring at home? Yes - home monitor 100/50-70 mmHg . Following low-sodium/fluid-restricted diet? Yes - wife is "salt police"   HF Medications: Carvedilol 6.25 mg PO BID Furosemide 40 mg PO daily Losartan 25 mg PO QHS  Has the patient been experiencing any side effects to the medications prescribed?  no  Does the patient have any problems obtaining medications due to transportation or finances?   No - [employer] Medicare (will enroll in PANF when reopens)   Understanding of regimen: fair Understanding of indications: fair Potential of compliance: good Patient understands to avoid NSAIDs. Patient understands to avoid decongestants.    Pertinent Lab Values: . 02/13/18: Serum creatinine 1.54 (BL ~1.5-1.8), BUN 20, Potassium 4.3, Sodium 139  Vital Signs: . Weight: 243.2 lb (dry weight: 236 lb) . Blood pressure: 138/88 mmHg  . Heart rate: 73 bpm    Assessment: 1. Chronicsystolic CHF (EF 18-56%), due to valvular disease. NYHA class IIsymptoms.  - Volume status stable  - Switch from losartan to Entresto 24-26 mg BID (patient states he can afford $45/mo copay, also given 30 day free card)  - Will have him continue to monitor BP at home and for s/s hypotension with switch   - Continue carvedilol 6.25 mg BID and furosemide 40 mg daily  - As BP tolerates, can consider addition of spironolactone in the future  - Basic disease state pathophysiology, medication indication, mechanism  and side effects reviewed at length with patient and he verbalized understanding 2. Valvular heart disease: TEE in 9/18 showed severe MR, probably infarct-related with restrictive posterior mitral leaflet. There was moderate aortic stenosis and moderate-severe aortic insufficiency, possible rheumatic.  In 12/18, he had bioprosthetic MVR and AVR.  - Echo post op showed EF  30-35% 01/02/18  - Started cardiac rehab last week  3. CKD: Stage 3. - BMET today - SCr stable at BL ~ 1.5-1.8 4. Atrial fibrillation: Paroxysmal.  He tends to tolerate afib poorly.  He is now maintaining NSR after Maze in 12/18.  He is no longer on amiodarone. Stable. - He will continue warfarin rather than DOAC given bioprosthetic valves.  5. COPD: Mild obstruction on PFTs.  He has quit smoking.  Stable. No change.   6. CVA: Remote, minimal residual. S/p carotid stenting.  - No ASA given warfarin use.  7. Hyperlipidemia: Good lipids in 1/19.  8. Anemia: Fe deficiency.  FOBT negative 8/18.  Got feraheme in 8/18.  Should have eventual GI workup.  - Continues on PO Fe daily  9. 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  The artery to the PDA was occluded with left to right collaterals.  He had SVG-PDA in 12/18.  - No s/s of ischemia. - Continue statin.   - No ASA given warfarin use.  10. CHB: has Medtronic PPM.  We did not check this visit, will need to check next visit to see how much RV pacing that he is doing.   Plan: 1) Medication changes: Based on clinical presentation, vital signs and recent labs will switch from losartan to Entresto 24-26 mg BID 2) Labs: BMET today and in 2 weeks  3) Follow-up: Pharmacy visit on 3/19 and Dr. Aundra Dubin on 03/15/18    Ruta Hinds. Velva Harman, PharmD, BCPS, CPP Clinical Pharmacist Pager: [phone] Phone: [phone] 02/13/2018 10:26 AM

## 2018-02-14 ENCOUNTER — Telehealth (HOSPITAL_COMMUNITY): Payer: Self-pay | Admitting: Pharmacist

## 2018-02-14 ENCOUNTER — Encounter (HOSPITAL_COMMUNITY)
Admission: RE | Admit: 2018-02-14 | Discharge: 2018-02-14 | Disposition: A | Discharge status: Home / Self Care | Payer: Medicare HMO | Source: Ambulatory Visit | Attending: Internal Medicine | Admitting: Internal Medicine

## 2018-02-14 DIAGNOSIS — Z87891 Personal history of nicotine dependence: Secondary | ICD-10-CM | POA: Diagnosis not present

## 2018-02-14 DIAGNOSIS — Z8673 Personal history of transient ischemic attack (TIA), and cerebral infarction without residual deficits: Secondary | ICD-10-CM | POA: Diagnosis not present

## 2018-02-14 DIAGNOSIS — Z79899 Other long term (current) drug therapy: Secondary | ICD-10-CM | POA: Diagnosis not present

## 2018-02-14 DIAGNOSIS — Z9889 Other specified postprocedural states: Secondary | ICD-10-CM | POA: Diagnosis not present

## 2018-02-14 DIAGNOSIS — Z952 Presence of prosthetic heart valve: Secondary | ICD-10-CM

## 2018-02-14 DIAGNOSIS — Z951 Presence of aortocoronary bypass graft: Secondary | ICD-10-CM

## 2018-02-14 DIAGNOSIS — K219 Gastro-esophageal reflux disease without esophagitis: Secondary | ICD-10-CM | POA: Diagnosis not present

## 2018-02-14 DIAGNOSIS — Z953 Presence of xenogenic heart valve: Secondary | ICD-10-CM | POA: Diagnosis not present

## 2018-02-14 DIAGNOSIS — J449 Chronic obstructive pulmonary disease, unspecified: Secondary | ICD-10-CM | POA: Diagnosis not present

## 2018-02-14 DIAGNOSIS — Z79891 Long term (current) use of opiate analgesic: Secondary | ICD-10-CM | POA: Diagnosis not present

## 2018-02-14 MED FILL — ENTRESTO 24 MG-26 MG TABLET: 24-26 | 30 days supply | Qty: 60 | Fill #0

## 2018-02-14 NOTE — Telephone Encounter (Signed)
Entresto 24-26 mg BID PA approved by [facility] Part D through 02/13/20.   Ruta Hinds. Velva Harman, PharmD, BCPS, CPP Clinical Pharmacist Phone: [phone] 02/14/2018 10:17 AM

## 2018-02-14 NOTE — Telephone Encounter (Signed)
Mrs. Fulgham called concerned about the cash price of Entresto. I have advised her that with [facility] Medicare her copay should be $45/mo and I also mentioned that I would enroll him in PANF once that reopens as well. She is comfortable with this copay in the meantime. I have also called [employer] who confirmed the $45 copay.   Ruta Hinds. Velva Harman, PharmD, BCPS, CPP Clinical Pharmacist Phone: [phone] 02/14/2018 12:03 PM

## 2018-02-16 ENCOUNTER — Encounter (HOSPITAL_COMMUNITY)
Admission: RE | Admit: 2018-02-16 | Discharge: 2018-02-16 | Disposition: A | Discharge status: Home / Self Care | Payer: Medicare HMO | Source: Ambulatory Visit | Attending: Internal Medicine | Admitting: Internal Medicine

## 2018-02-16 DIAGNOSIS — J449 Chronic obstructive pulmonary disease, unspecified: Secondary | ICD-10-CM | POA: Diagnosis not present

## 2018-02-16 DIAGNOSIS — Z79891 Long term (current) use of opiate analgesic: Secondary | ICD-10-CM | POA: Diagnosis not present

## 2018-02-16 DIAGNOSIS — Z951 Presence of aortocoronary bypass graft: Secondary | ICD-10-CM

## 2018-02-16 DIAGNOSIS — Z79899 Other long term (current) drug therapy: Secondary | ICD-10-CM | POA: Diagnosis not present

## 2018-02-16 DIAGNOSIS — Z8673 Personal history of transient ischemic attack (TIA), and cerebral infarction without residual deficits: Secondary | ICD-10-CM | POA: Diagnosis not present

## 2018-02-16 DIAGNOSIS — Z950 Presence of cardiac pacemaker: Secondary | ICD-10-CM

## 2018-02-16 DIAGNOSIS — Z9889 Other specified postprocedural states: Secondary | ICD-10-CM | POA: Diagnosis not present

## 2018-02-16 DIAGNOSIS — Z87891 Personal history of nicotine dependence: Secondary | ICD-10-CM | POA: Diagnosis not present

## 2018-02-16 DIAGNOSIS — Z953 Presence of xenogenic heart valve: Secondary | ICD-10-CM | POA: Diagnosis not present

## 2018-02-16 DIAGNOSIS — K219 Gastro-esophageal reflux disease without esophagitis: Secondary | ICD-10-CM | POA: Diagnosis not present

## 2018-02-16 DIAGNOSIS — Z952 Presence of prosthetic heart valve: Secondary | ICD-10-CM

## 2018-02-19 ENCOUNTER — Encounter (HOSPITAL_COMMUNITY)
Admission: RE | Admit: 2018-02-19 | Discharge: 2018-02-19 | Disposition: A | Discharge status: Home / Self Care | Payer: Medicare HMO | Source: Ambulatory Visit | Attending: Internal Medicine | Admitting: Internal Medicine

## 2018-02-19 ENCOUNTER — Ambulatory Visit (INDEPENDENT_AMBULATORY_CARE_PROVIDER_SITE_OTHER): Payer: Medicare HMO | Admitting: *Deleted

## 2018-02-19 ENCOUNTER — Ambulatory Visit (HOSPITAL_COMMUNITY)
Admission: RE | Admit: 2018-02-19 | Discharge: 2018-02-19 | Disposition: A | Discharge status: Home / Self Care | Payer: Medicare HMO | Source: Ambulatory Visit | Attending: Cardiology | Admitting: Cardiology

## 2018-02-19 DIAGNOSIS — Z9889 Other specified postprocedural states: Secondary | ICD-10-CM

## 2018-02-19 DIAGNOSIS — Z951 Presence of aortocoronary bypass graft: Secondary | ICD-10-CM | POA: Insufficient documentation

## 2018-02-19 DIAGNOSIS — Z5181 Encounter for therapeutic drug level monitoring: Secondary | ICD-10-CM | POA: Diagnosis not present

## 2018-02-19 DIAGNOSIS — D649 Anemia, unspecified: Secondary | ICD-10-CM | POA: Diagnosis not present

## 2018-02-19 DIAGNOSIS — Z8679 Personal history of other diseases of the circulatory system: Secondary | ICD-10-CM | POA: Diagnosis not present

## 2018-02-19 DIAGNOSIS — I480 Paroxysmal atrial fibrillation: Secondary | ICD-10-CM

## 2018-02-19 DIAGNOSIS — Z953 Presence of xenogenic heart valve: Secondary | ICD-10-CM | POA: Diagnosis not present

## 2018-02-19 DIAGNOSIS — Z952 Presence of prosthetic heart valve: Secondary | ICD-10-CM

## 2018-02-19 LAB — POCT INR: INR: 2.4

## 2018-02-19 LAB — CBC
HEMATOCRIT: 38 % — AB (ref 39.0–52.0)
HEMOGLOBIN: 12 g/dL — AB (ref 13.0–17.0)
MCH: 29.9 pg (ref 26.0–34.0)
MCHC: 31.6 g/dL (ref 30.0–36.0)
MCV: 94.5 fL (ref 78.0–100.0)
Platelets: 242 10*3/uL (ref 150–400)
RBC: 4.02 MIL/uL — AB (ref 4.22–5.81)
RDW: 14.1 % (ref 11.5–15.5)
WBC: 7.7 10*3/uL (ref 4.0–10.5)

## 2018-02-19 LAB — VITAMIN B12: VITAMIN B 12: 198 pg/mL (ref 180–914)

## 2018-02-19 LAB — IRON AND TIBC
Iron: 72 ug/dL (ref 45–182)
Saturation Ratios: 24 % (ref 17.9–39.5)
TIBC: 294 ug/dL (ref 250–450)
UIBC: 222 ug/dL

## 2018-02-19 LAB — FERRITIN: Ferritin: 87 ng/mL (ref 24–336)

## 2018-02-19 LAB — FOLATE: FOLATE: 13.4 ng/mL (ref >5.9)

## 2018-02-19 NOTE — Progress Notes (Signed)
Research Encounter  Patient screened and consented for IV Iron Study. CBC/anemia panel obtained, 6MWT conducted, and quality of life survey completed. Will replete iron per study protocol if the patient meets all inclusion criteria.   6-min walk test - Baseline HR 71, Sp02 98 Ambulated well, unassisted, no breaks HR and Sp02 ranged from 71-112 and 93-98  Total Distance walked: 1150 feet (350.52 m)  Leroy Libman, PharmD Pharmacy Resident Pager: [phone]

## 2018-02-19 NOTE — Patient Instructions (Signed)
Description   Continue taking 1 tablet (5mg ) daily except 1.5 tablets on Mondays.  Recheck INR in 3 weeks. Call with any new medications scheduled procedures or any questions  [phone]

## 2018-02-20 MED FILL — PANTOPRAZOLE SOD DR 40 MG T: 40 | 30 days supply | Qty: 30 | Fill #0

## 2018-02-20 MED FILL — WARFARIN SODIUM 5 MG TABLET: 5 | 40 days supply | Qty: 40 | Fill #0

## 2018-02-21 ENCOUNTER — Encounter (HOSPITAL_COMMUNITY)
Admission: RE | Admit: 2018-02-21 | Discharge: 2018-02-21 | Disposition: A | Discharge status: Home / Self Care | Payer: Medicare HMO | Source: Ambulatory Visit | Attending: Internal Medicine | Admitting: Internal Medicine

## 2018-02-21 DIAGNOSIS — Z8673 Personal history of transient ischemic attack (TIA), and cerebral infarction without residual deficits: Secondary | ICD-10-CM | POA: Diagnosis not present

## 2018-02-21 DIAGNOSIS — J449 Chronic obstructive pulmonary disease, unspecified: Secondary | ICD-10-CM | POA: Diagnosis not present

## 2018-02-21 DIAGNOSIS — Z9889 Other specified postprocedural states: Secondary | ICD-10-CM | POA: Diagnosis not present

## 2018-02-21 DIAGNOSIS — Z951 Presence of aortocoronary bypass graft: Secondary | ICD-10-CM | POA: Diagnosis not present

## 2018-02-21 DIAGNOSIS — K219 Gastro-esophageal reflux disease without esophagitis: Secondary | ICD-10-CM | POA: Diagnosis not present

## 2018-02-21 DIAGNOSIS — Z87891 Personal history of nicotine dependence: Secondary | ICD-10-CM | POA: Diagnosis not present

## 2018-02-21 DIAGNOSIS — Z952 Presence of prosthetic heart valve: Secondary | ICD-10-CM

## 2018-02-21 DIAGNOSIS — Z79891 Long term (current) use of opiate analgesic: Secondary | ICD-10-CM | POA: Diagnosis not present

## 2018-02-21 DIAGNOSIS — Z953 Presence of xenogenic heart valve: Secondary | ICD-10-CM | POA: Diagnosis not present

## 2018-02-21 DIAGNOSIS — Z79899 Other long term (current) drug therapy: Secondary | ICD-10-CM | POA: Diagnosis not present

## 2018-02-21 NOTE — Progress Notes (Signed)
I have reviewed a Home Exercise Prescription with Brandon [name] . [name] is not currently exercising at home.  The patient was advised to walk 2-3 days a week for 30-45 minutes.  [name] and I discussed how to progress their exercise prescription. The patient stated that they understand the exercise prescription.  We reviewed exercise guidelines, target heart rate during exercise, weather, endpoints for exercise, and goals.  Patient is encouraged to come to me with any questions. I will continue to follow up with the patient to assist them with progression and safety.    Carma Lair 02/21/2018 4:39 PM

## 2018-02-23 ENCOUNTER — Encounter (HOSPITAL_COMMUNITY)
Admission: RE | Admit: 2018-02-23 | Discharge: 2018-02-23 | Disposition: A | Discharge status: Home / Self Care | Payer: Medicare HMO | Source: Ambulatory Visit | Attending: Internal Medicine | Admitting: Internal Medicine

## 2018-02-23 DIAGNOSIS — J449 Chronic obstructive pulmonary disease, unspecified: Secondary | ICD-10-CM | POA: Diagnosis not present

## 2018-02-23 DIAGNOSIS — Z87891 Personal history of nicotine dependence: Secondary | ICD-10-CM | POA: Diagnosis not present

## 2018-02-23 DIAGNOSIS — Z953 Presence of xenogenic heart valve: Secondary | ICD-10-CM | POA: Diagnosis not present

## 2018-02-23 DIAGNOSIS — K219 Gastro-esophageal reflux disease without esophagitis: Secondary | ICD-10-CM | POA: Diagnosis not present

## 2018-02-23 DIAGNOSIS — Z9889 Other specified postprocedural states: Secondary | ICD-10-CM | POA: Diagnosis not present

## 2018-02-23 DIAGNOSIS — Z79899 Other long term (current) drug therapy: Secondary | ICD-10-CM | POA: Diagnosis not present

## 2018-02-23 DIAGNOSIS — Z8673 Personal history of transient ischemic attack (TIA), and cerebral infarction without residual deficits: Secondary | ICD-10-CM | POA: Diagnosis not present

## 2018-02-23 DIAGNOSIS — Z79891 Long term (current) use of opiate analgesic: Secondary | ICD-10-CM | POA: Diagnosis not present

## 2018-02-23 DIAGNOSIS — Z951 Presence of aortocoronary bypass graft: Secondary | ICD-10-CM | POA: Diagnosis not present

## 2018-02-23 DIAGNOSIS — Z952 Presence of prosthetic heart valve: Secondary | ICD-10-CM

## 2018-02-26 ENCOUNTER — Encounter (HOSPITAL_COMMUNITY)
Admission: RE | Admit: 2018-02-26 | Discharge: 2018-02-26 | Disposition: A | Discharge status: Home / Self Care | Payer: Medicare HMO | Source: Ambulatory Visit | Attending: Internal Medicine | Admitting: Internal Medicine

## 2018-02-26 ENCOUNTER — Ambulatory Visit (HOSPITAL_COMMUNITY)
Admission: RE | Admit: 2018-02-26 | Discharge: 2018-02-26 | Disposition: A | Discharge status: Home / Self Care | Payer: Medicare HMO | Source: Ambulatory Visit | Attending: Cardiology | Admitting: Cardiology

## 2018-02-26 ENCOUNTER — Telehealth: Payer: Self-pay | Admitting: *Deleted

## 2018-02-26 ENCOUNTER — Other Ambulatory Visit (HOSPITAL_COMMUNITY): Payer: Self-pay

## 2018-02-26 ENCOUNTER — Ambulatory Visit (INDEPENDENT_AMBULATORY_CARE_PROVIDER_SITE_OTHER): Payer: Medicare HMO | Admitting: Thoracic Surgery (Cardiothoracic Vascular Surgery)

## 2018-02-26 ENCOUNTER — Telehealth (HOSPITAL_COMMUNITY): Payer: Self-pay | Admitting: *Deleted

## 2018-02-26 ENCOUNTER — Encounter: Payer: Self-pay | Admitting: Thoracic Surgery (Cardiothoracic Vascular Surgery)

## 2018-02-26 VITALS — BP 85/55 | HR 72 | Resp 20 | Ht 69.0 in | Wt 244.0 lb

## 2018-02-26 DIAGNOSIS — Z953 Presence of xenogenic heart valve: Secondary | ICD-10-CM | POA: Diagnosis not present

## 2018-02-26 DIAGNOSIS — Z952 Presence of prosthetic heart valve: Secondary | ICD-10-CM

## 2018-02-26 DIAGNOSIS — Z8679 Personal history of other diseases of the circulatory system: Secondary | ICD-10-CM

## 2018-02-26 DIAGNOSIS — I5022 Chronic systolic (congestive) heart failure: Secondary | ICD-10-CM | POA: Insufficient documentation

## 2018-02-26 DIAGNOSIS — Z9889 Other specified postprocedural states: Secondary | ICD-10-CM | POA: Diagnosis not present

## 2018-02-26 DIAGNOSIS — D509 Iron deficiency anemia, unspecified: Secondary | ICD-10-CM

## 2018-02-26 DIAGNOSIS — Z950 Presence of cardiac pacemaker: Secondary | ICD-10-CM

## 2018-02-26 DIAGNOSIS — Z951 Presence of aortocoronary bypass graft: Secondary | ICD-10-CM

## 2018-02-26 LAB — BASIC METABOLIC PANEL
Anion gap: 12 (ref 5–15)
BUN: 24 mg/dL — AB (ref 6–20)
CALCIUM: 8.9 mg/dL (ref 8.9–10.3)
CO2: 21 mmol/L — AB (ref 22–32)
CREATININE: 1.56 mg/dL — AB (ref 0.61–1.24)
Chloride: 106 mmol/L (ref 101–111)
GFR calc Af Amer: 50 mL/min — ABNORMAL LOW (ref >60)
GFR calc non Af Amer: 43 mL/min — ABNORMAL LOW (ref >60)
GLUCOSE: 97 mg/dL (ref 65–99)
Potassium: 4.2 mmol/L (ref 3.5–5.1)
Sodium: 139 mmol/L (ref 135–145)

## 2018-02-26 MED ORDER — FUROSEMIDE 20 MG PO TABS: 20.0000 mg | ORAL_TABLET | Freq: Every day | ORAL | 3 refills | Status: DC

## 2018-02-26 NOTE — Addendum Note (Signed)
Encounter addended by: Darron Doom, RN on: 02/26/2018 12:29 PM  Actions taken: Sign clinical note

## 2018-02-26 NOTE — Telephone Encounter (Signed)
Call from pt's wife who states he is to hold Lasix tomorrow then reduce Lasix to 20 mg daily  Instructed this will not interfere with his coumadin and she states understanding

## 2018-02-26 NOTE — Patient Instructions (Signed)
HOLD Lasix tomorrow, then decrease to 20 mg Once Daily starting on Wednesday 02/28/18

## 2018-02-26 NOTE — Progress Notes (Signed)
Incomplete Session Note  Patient Details  Name: Brandon [name] MRN: [id number] Date of Birth: 11-30-1946 Referring Provider:     CARDIAC REHAB PHASE II ORIENTATION from 02/01/2018 in [place]  Referring Provider  Wyatt Haste MD and Loralie Champagne MD      [name] did not complete his rehab session.  Entry blood pressure 85/54 heart rate 72. Sao2 99% Patient asymptomatic.  No exercise due to low BP. Patient given a cup of Gatorade. Recheck blood pressure 99/65 sitting. Standing blood pressure 86/50. Patient given a cup of water. Recheck blood pressure 131/76 sitting. Standing blood pressure 135/76. I called Mrs Weathington and she told me over the phone that [name] has been a lot more fatigued since he has been taking Entresto the past 2 weeks. I called the heart failure clinic to notify about today's low BP readings and talked with Susie RN.  [name] left cardiac rehab without complaints and went to the heart failure clinic for blood work. [name] was given a copy of his exercise flow sheets to take to the heart failure clinic and to Dr Guy Sandifer office this afternoon. Patient left cardiac rehab without complaints or symptoms.Barnet Pall, RN,BSN 02/26/2018 2:00 PM

## 2018-02-26 NOTE — Progress Notes (Signed)
[address]       [place] [address]             [phone]     CARDIOTHORACIC SURGERY OFFICE NOTE  Referring Provider is Larey Dresser, MD PCP is Maurice Small, MD   HPI:  Patient is a 72 year old moderately obese white male with history of cerebrovascular disease status post right hemispheric stroke in 2004, hypertension, aortic stenosis with aortic insufficiency, mitral regurgitation, and recurrent paroxysmal atrial fibrillation who returns to the office today for routine follow-up status post aortic and mitral valve replacement using bioprosthetic tissue valve, coronary artery bypass grafting x1, and Maze procedure on November 16, 2017.  Findings at the time of surgery were consistent with rheumatic aortic and mitral valve disease and moderate left ventricular chamber enlargement with significant atherosclerotic disease involving the thoracic aorta.  The patient's early postoperative convalescence was somewhat slow and he required permanent pacemaker placement for postop complete heart block.  Most recent interrogation of his pacemaker on January 07, 2017 revealed sinus rhythm with first-degree AV block and no significant mode switching.  He subsequently did fairly well and was last seen here in our office on December 25, 2017 at which time he was making excellent progress.  Routine follow-up echocardiogram performed January 02, 2018 revealed normal left ventricular size with moderate to severely reduced systolic function, ejection fraction estimated 30-35%.  The aortic and mitral valves were both functioning normally.  He was last seen in follow-up by Dr. Aundra Dubin on January 15, 2018.  Since then the patient has reportedly done exceptionally well.  He has been actively participating in the outpatient cardiac rehab program and his strength and exercise tolerance has been gradually improving.  However, when he went for rehab this morning his blood pressure was notably low.   The patient was asymptomatic.  He was sent to the advanced heart failure clinic earlier today where his medications were adjusted.  He returns to our office for follow-up.  He reports that he feels quite well.  He denies any significant pain or soreness in his chest.  He denies any palpitations or other symptoms to suggest a recurrence of atrial fibrillation.   Current Outpatient Medications  Medication Sig Dispense Refill  . acetaminophen (TYLENOL) 325 MG tablet Take 1 tablet (325 mg total) by mouth every 6 (six) hours as needed for mild pain. 30 tablet 0  . calcium carbonate (TUMS - DOSED IN MG ELEMENTAL CALCIUM) 500 MG chewable tablet Chew 1 tablet by mouth daily as needed for indigestion or heartburn.    . carvedilol (COREG) 6.25 MG tablet Take 1 tablet (6.25 mg total) by mouth 2 (two) times daily. 60 tablet 6  . cetirizine (ZYRTEC) 10 MG tablet Take 10 mg by mouth daily as needed for allergies.    . citalopram (CELEXA) 20 MG tablet Take 1 tablet (20 mg total) by mouth at bedtime. 30 tablet 5  . docusate sodium (COLACE) 100 MG capsule Take 1 capsule (100 mg total) by mouth 3 (three) times daily as needed. 20 capsule 0  . furosemide (LASIX) 20 MG tablet Take 1 tablet (20 mg total) by mouth daily. 30 tablet 3  . LORazepam (ATIVAN) 0.5 MG tablet Take 1 tablet (0.5 mg total) by mouth every 6 (six) hours as needed for anxiety (aggitation). 20 tablet 0  . lovastatin (MEVACOR) 40 MG tablet TAKE 1 TABLET AT BEDTIME  (NEW  DOSE) 90 tablet 3  . ondansetron (ZOFRAN)  4 MG tablet Take 4 mg by mouth every 8 (eight) hours as needed for nausea or vomiting.    [name] oxyCODONE (OXY IR/ROXICODONE) 5 MG immediate release tablet Take 1 tablet (5 mg total) by mouth every 4 (four) hours as needed for severe pain. (Patient not taking: Reported on 02/13/2018) 30 tablet 0  . pantoprazole (PROTONIX) 40 MG tablet Take 1 tablet (40 mg total) by mouth daily. 30 tablet 1  . sacubitril-valsartan (ENTRESTO) 24-26 MG Take 1 tablet by  mouth 2 (two) times daily. 60 tablet 5  . warfarin (COUMADIN) 5 MG tablet Take 1 tablet (5 mg total) by mouth daily at 6 PM. 40 tablet 3   No current facility-administered medications for this visit.       Physical Exam:   BP (!) 85/55   Pulse 72   Resp 20   Ht 5\' 9"  (1.753 m)   Wt 244 lb (110.7 kg)   SpO2 98% Comment: RA  BMI 36.03 kg/m   General:  Well-appearing  Chest:   Clear to auscultation  CV:   Regular rate and rhythm without murmur  Incisions:  Completely healed  Abdomen:  Soft nontender  Extremities:  Warm and well perfused, somewhat decreased skin turgor  Diagnostic Tests:  2 channel telemetry rhythm strip demonstrates sinus rhythm with V pacing    Echocardiography  Patient:    Brandon [name], Brandon [name] MR #:       [id number] Study Date: 01/02/2018 Gender:     M Age:        22 Height:     175.3 cm Weight:     106.6 kg BSA:        2.32 m^2 Pt. Status: Room:   ATTENDING    Candee Furbish, M.D.  ORDERING     Loralie Champagne, M.D.  REFERRING    Loralie Champagne, M.D.  PERFORMING   [facility], Outpatient  SONOGRAPHER  Bethany McMahill, RDCS  cc:  ------------------------------------------------------------------- LV EF: 30% -   35%  ------------------------------------------------------------------- Indications:      CHF (I50.32).  ------------------------------------------------------------------- History:   PMH:   Atrial fibrillation.  Coronary artery disease. Congestive heart failure.  Stroke.  Chronic obstructive pulmonary disease.  PMH:   Myocardial infarction.  Risk factors:  CABG with Aortic Valve Replacement (23 mm MagnaEase), Mitral Valve Replacement (27 mm MagnaEase), and MAZE Procedure on 11-16-2017. Former tobacco use. Hypertension. Dyslipidemia.  ------------------------------------------------------------------- Study Conclusions  - Left ventricle: The cavity size was normal. There was mild focal   basal hypertrophy of the septum. Systolic  function was moderately   to severely reduced. The estimated ejection fraction was in the   range of 30% to 35%. Wall motion was normal; there were no   regional wall motion abnormalities. The study is not technically   sufficient to allow evaluation of LV diastolic function. - Ventricular septum: Septal motion showed abnormal function and   dyssynergy. - Aortic valve: A bioprosthesis was present and functioning   normally. Peak velocity (S): 290 cm/s. Mean gradient (S): 15 mm   Hg. Valve area (VTI): 0.87 cm^2. Valve area (Vmax): 0.82 cm^2.   Valve area (Vmean): 0.9 cm^2. - Aorta: Ascending aortic diameter: 39 mm (S). - Ascending aorta: The ascending aorta was mildly dilated. - Mitral valve: A bioprosthesis was present and functioning   normally. Valve area by pressure half-time: 2.47 cm^2. Valve area   by continuity equation (using LVOT flow): 1.16 cm^2. - Left atrium: The atrium was mildly dilated. Volume/bsa, S: 37.7  ml/m^2. - Right ventricle: The cavity size was mildly dilated. Wall   thickness was normal.  ------------------------------------------------------------------- Labs, prior tests, procedures, and surgery: Permanent pacemaker system implantation.  Coronary artery bypass grafting.  ------------------------------------------------------------------- Study data:  Comparison was made to the study of 11/16/2017.  Study status:  Routine.  Procedure:  Transthoracic echocardiography. Image quality was adequate.          Echocardiography.  M-mode, complete 2D, 3D, spectral Doppler, and color Doppler.  Birthdate: Patient birthdate: [birth year].  Age:  Patient is 72 yr old.  Sex: Gender: male.    BMI: 34.7 kg/m^2.  Blood pressure:     140/81 Patient status:  Outpatient.  Study date:  Study date: 01/02/2018. Study time: 01:18 PM.  Location:  [facility]  3  -------------------------------------------------------------------  ------------------------------------------------------------------- Left ventricle:  The cavity size was normal. There was mild focal basal hypertrophy of the septum. Systolic function was moderately to severely reduced. The estimated ejection fraction was in the range of 30% to 35%. Wall motion was normal; there were no regional wall motion abnormalities. The study is not technically sufficient to allow evaluation of LV diastolic function.  ------------------------------------------------------------------- Aortic valve:   Normal thickness leaflets. A bioprosthesis was present and functioning normally. Mobility was not restricted. Doppler:  Transvalvular velocity was within the normal range. There was no stenosis. There was no regurgitation.    VTI ratio of LVOT to aortic valve: 0.34. Valve area (VTI): 0.87 cm^2. Indexed valve area (VTI): 0.38 cm^2/m^2. Peak velocity ratio of LVOT to aortic valve: 0.32. Valve area (Vmax): 0.82 cm^2. Indexed valve area (Vmax): 0.35 cm^2/m^2. Mean velocity ratio of LVOT to aortic valve: 0.35. Valve area (Vmean): 0.9 cm^2. Indexed valve area (Vmean): 0.39 cm^2/m^2.    Mean gradient (S): 15 mm Hg. Peak gradient (S): 34 mm Hg.  ------------------------------------------------------------------- Aorta:  Aortic root: The aortic root was normal in size. Ascending aorta: The ascending aorta was mildly dilated.  ------------------------------------------------------------------- Mitral valve:  A bioprosthesis was present and functioning normally. Mobility was not restricted.  Doppler:  Transvalvular velocity was within the normal range. There was no evidence for stenosis. There was no regurgitation.    Valve area by pressure half-time: 2.47 cm^2. Indexed valve area by pressure half-time: 1.07 cm^2/m^2. Valve area by continuity equation (using LVOT flow): 1.16 cm^2. Indexed valve  area by continuity equation (using LVOT flow): 0.5 cm^2/m^2.    Mean gradient (D): 4 mm Hg. Peak gradient (D): 11 mm Hg.  ------------------------------------------------------------------- Left atrium:  The atrium was mildly dilated.  ------------------------------------------------------------------- Right ventricle:  The cavity size was mildly dilated. Wall thickness was normal. Systolic function was normal.  ------------------------------------------------------------------- Ventricular septum:   Septal motion showed abnormal function and dyssynergy.  ------------------------------------------------------------------- Pulmonic valve:    Doppler:  Transvalvular velocity was within the normal range. There was no evidence for stenosis.  ------------------------------------------------------------------- Tricuspid valve:   Structurally normal valve.    Doppler: Transvalvular velocity was within the normal range. There was no regurgitation.  ------------------------------------------------------------------- Pulmonary artery:   The main pulmonary artery was normal-sized. Systolic pressure was within the normal range.  ------------------------------------------------------------------- Right atrium:  The atrium was normal in size.  ------------------------------------------------------------------- Pericardium:  There was no pericardial effusion.  ------------------------------------------------------------------- Systemic veins: Inferior vena cava: The vessel was normal in size.  ------------------------------------------------------------------- Measurements   Left ventricle                           Value  Reference  LV ID, ED, PLAX chordal          (H)     56    mm       43 - 52  LV ID, ES, PLAX chordal          (H)     48.4  mm       23 - 38  LV fx shortening, PLAX chordal   (L)     14    %        >=29  LV PW thickness, ED                      13.2   mm       ----------  IVS/LV PW ratio, ED                      0.77           <=1.3  Stroke volume, 2D                        47    ml       ----------  Stroke volume/bsa, 2D                    20    ml/m^2   ----------  LV e&', lateral                           8.59  cm/s     ----------  LV E/e&', lateral                         19.21          ----------  LV e&', medial                            4.03  cm/s     ----------  LV E/e&', medial                          40.94          ----------  LV e&', average                           6.31  cm/s     ----------  LV E/e&', average                         26.15          ----------    Ventricular septum                       Value          Reference  IVS thickness, ED                        10.2  mm       ----------    LVOT                                     Value          Reference  LVOT ID,  S                               18    mm       ----------  LVOT area                                2.54  cm^2     ----------  LVOT peak velocity, S                    93.8  cm/s     ----------  LVOT mean velocity, S                    57.9  cm/s     ----------  LVOT VTI, S                              18.5  cm       ----------    Aortic valve                             Value          Reference  Aortic valve peak velocity, S            290   cm/s     ----------  Aortic valve mean velocity, S            164   cm/s     ----------  Aortic valve VTI, S                      53.9  cm       ----------  Aortic mean gradient, S                  15    mm Hg    ----------  Aortic peak gradient, S                  34    mm Hg    ----------  VTI ratio, LVOT/AV                       0.34           ----------  Aortic valve area, VTI                   0.87  cm^2     ----------  Aortic valve area/bsa, VTI               0.38  cm^2/m^2 ----------  Velocity ratio, peak, LVOT/AV            0.32           ----------  Aortic valve area, peak velocity         0.82  cm^2      ----------  Aortic valve area/bsa, peak              0.35  cm^2/m^2 ----------  velocity  Velocity ratio, mean, LVOT/AV            0.35           ----------  Aortic valve area, mean velocity         0.9  cm^2     ----------  Aortic valve area/bsa, mean              0.39  cm^2/m^2 ----------  velocity    Aorta                                    Value          Reference  Aortic root ID, ED                       39    mm       ----------  Ascending aorta ID, A-P, S               39    mm       ----------    Left atrium                              Value          Reference  LA ID, A-P, ES                           49    mm       ----------  LA ID/bsa, A-P                           2.11  cm/m^2   <=2.2  LA volume, S                             87.4  ml       ----------  LA volume/bsa, S                         37.7  ml/m^2   ----------  LA volume, ES, 1-p A4C                   80.5  ml       ----------  LA volume/bsa, ES, 1-p A4C               34.7  ml/m^2   ----------  LA volume, ES, 1-p A2C                   93.7  ml       ----------  LA volume/bsa, ES, 1-p A2C               40.4  ml/m^2   ----------    Mitral valve                             Value          Reference  Mitral E-wave peak velocity              165   cm/s     ----------  Mitral A-wave peak velocity              131   cm/s     ----------  Mitral mean velocity, D                  93.6  cm/s     ----------  Mitral deceleration time         (H)     275   ms       150 - 230  Mitral pressure half-time                89    ms       ----------  Mitral mean gradient, D                  4     mm Hg    ----------  Mitral peak gradient, D                  11    mm Hg    ----------  Mitral E/A ratio, peak                   1.4            ----------  Mitral valve area, PHT, DP               2.47  cm^2     ----------  Mitral valve area/bsa, PHT, DP           1.07  cm^2/m^2 ----------  Mitral valve area, LVOT                  1.16  cm^2      ----------  continuity  Mitral valve area/bsa, LVOT              0.5   cm^2/m^2 ----------  continuity  Mitral annulus VTI, D                    40.5  cm       ----------    Tricuspid valve                          Value          Reference  Tricuspid regurg peak velocity           281   cm/s     ----------  Tricuspid peak RV-RA gradient            32    mm Hg    ----------    Right atrium                             Value          Reference  RA ID, S-I, ES, A4C              (H)     60.8  mm       34 - 49  RA area, ES, A4C                 (H)     25.1  cm^2     8.3 - 19.5  RA volume, ES, A/L                       85    ml       ----------  RA volume/bsa, ES, A/L                   36.7  ml/m^2   ----------    Right ventricle  Value          Reference  TAPSE                                    13.7  mm       ----------  RV s&', lateral, S                        12.6  cm/s     ----------  Legend: (L)  and  (H)  mark values outside specified reference range.  ------------------------------------------------------------------- Prepared and Electronically Authenticated by  Candee Furbish, M.D. 2019-01-22T14:17:58   Impression:  Patient appears to be doing well and is maintaining sinus rhythm approximately 3 months status post aortic and mitral valve replacement using a bioprosthetic tissue valve with Maze procedure.  The patient's blood pressure is a little bit low today although he is asymptomatic.  On exam he may be borderline dehydrated.  Lasix was decreased earlier today.  Plan:  We have not recommended any changes the patient's current medications.  I have encouraged the patient to continue to gradually increase his physical activity without any particular limitations.  The patient has been reminded regarding the importance of dental hygiene and the lifelong need for antibiotic prophylaxis for all dental cleanings and other related invasive procedures.  He  will continue to follow-up with Dr. Aundra Dubin and return to our office for routine follow-up next December, approximately 1 year following his surgery.   I spent in excess of 15 minutes during the conduct of this office consultation and >50% of this time involved direct face-to-face encounter with the patient for counseling and/or coordination of their care.    Valentina Gu. Roxy Manns, MD 02/26/2018 2:33 PM

## 2018-02-26 NOTE — Patient Instructions (Signed)

## 2018-02-26 NOTE — Telephone Encounter (Signed)
Brandon [name] called from [facility] stating patient's BP was 86/50 at rest.  Patient given Gatorade and it came up to 99/65.  Patient only complains of feeling tired this weekend.  Per Dr. Aundra Dubin patient will come to lab for BMET and will hold lasix for one day and then reduce it to 20 mg Daily.  Will discuss at lab appointment with patient.

## 2018-02-27 ENCOUNTER — Other Ambulatory Visit (HOSPITAL_COMMUNITY): Payer: Self-pay | Admitting: *Deleted

## 2018-02-27 ENCOUNTER — Ambulatory Visit (HOSPITAL_COMMUNITY)
Admission: RE | Admit: 2018-02-27 | Discharge: 2018-02-27 | Disposition: A | Discharge status: Home / Self Care | Payer: Medicare HMO | Source: Ambulatory Visit | Attending: Cardiology | Admitting: Cardiology

## 2018-02-27 ENCOUNTER — Encounter: Payer: Self-pay | Admitting: Internal Medicine

## 2018-02-27 ENCOUNTER — Ambulatory Visit (INDEPENDENT_AMBULATORY_CARE_PROVIDER_SITE_OTHER): Payer: Medicare HMO | Admitting: Internal Medicine

## 2018-02-27 VITALS — BP 122/66 | HR 69 | Ht 72.0 in | Wt 243.0 lb

## 2018-02-27 DIAGNOSIS — E785 Hyperlipidemia, unspecified: Secondary | ICD-10-CM | POA: Insufficient documentation

## 2018-02-27 DIAGNOSIS — Z9889 Other specified postprocedural states: Secondary | ICD-10-CM

## 2018-02-27 DIAGNOSIS — J449 Chronic obstructive pulmonary disease, unspecified: Secondary | ICD-10-CM | POA: Diagnosis not present

## 2018-02-27 DIAGNOSIS — Z87891 Personal history of nicotine dependence: Secondary | ICD-10-CM | POA: Diagnosis not present

## 2018-02-27 DIAGNOSIS — Z7901 Long term (current) use of anticoagulants: Secondary | ICD-10-CM | POA: Insufficient documentation

## 2018-02-27 DIAGNOSIS — I5022 Chronic systolic (congestive) heart failure: Secondary | ICD-10-CM | POA: Diagnosis not present

## 2018-02-27 DIAGNOSIS — I2510 Atherosclerotic heart disease of native coronary artery without angina pectoris: Secondary | ICD-10-CM | POA: Insufficient documentation

## 2018-02-27 DIAGNOSIS — Z8679 Personal history of other diseases of the circulatory system: Secondary | ICD-10-CM | POA: Diagnosis not present

## 2018-02-27 DIAGNOSIS — Z951 Presence of aortocoronary bypass graft: Secondary | ICD-10-CM

## 2018-02-27 DIAGNOSIS — D509 Iron deficiency anemia, unspecified: Secondary | ICD-10-CM | POA: Insufficient documentation

## 2018-02-27 DIAGNOSIS — Z950 Presence of cardiac pacemaker: Secondary | ICD-10-CM

## 2018-02-27 DIAGNOSIS — I442 Atrioventricular block, complete: Secondary | ICD-10-CM | POA: Diagnosis not present

## 2018-02-27 DIAGNOSIS — I480 Paroxysmal atrial fibrillation: Secondary | ICD-10-CM | POA: Insufficient documentation

## 2018-02-27 DIAGNOSIS — I080 Rheumatic disorders of both mitral and aortic valves: Secondary | ICD-10-CM | POA: Insufficient documentation

## 2018-02-27 DIAGNOSIS — I6990 Unspecified sequelae of unspecified cerebrovascular disease: Secondary | ICD-10-CM | POA: Insufficient documentation

## 2018-02-27 DIAGNOSIS — Z953 Presence of xenogenic heart valve: Secondary | ICD-10-CM | POA: Diagnosis not present

## 2018-02-27 DIAGNOSIS — N183 Chronic kidney disease, stage 3 (moderate): Secondary | ICD-10-CM | POA: Insufficient documentation

## 2018-02-27 LAB — CUP PACEART INCLINIC DEVICE CHECK
Battery Voltage: 3.06 V
Brady Statistic AP VP Percent: 1.95 %
Brady Statistic AS VP Percent: 93.76 %
Brady Statistic RV Percent Paced: 95.71 %
Implantable Lead Implant Date: 20181211
Implantable Lead Location: 753860
Implantable Lead Model: 3830
Implantable Lead Model: 5076
Implantable Pulse Generator Implant Date: 20181211
Lead Channel Impedance Value: 285 Ohm
Lead Channel Impedance Value: 304 Ohm
Lead Channel Impedance Value: 380 Ohm
Lead Channel Impedance Value: 494 Ohm
Lead Channel Pacing Threshold Pulse Width: 1 ms
Lead Channel Sensing Intrinsic Amplitude: 2.25 mV
Lead Channel Sensing Intrinsic Amplitude: 3.875 mV
Lead Channel Setting Pacing Amplitude: 2 V
Lead Channel Setting Pacing Amplitude: 2.5 V
Lead Channel Setting Pacing Pulse Width: 1 ms
Lead Channel Setting Sensing Sensitivity: 1.2 mV
MDC IDC LEAD IMPLANT DT: 20181211
MDC IDC LEAD LOCATION: 753859
MDC IDC MSMT BATTERY REMAINING LONGEVITY: 103 mo
MDC IDC MSMT LEADCHNL RA PACING THRESHOLD AMPLITUDE: 0.625 V
MDC IDC MSMT LEADCHNL RA PACING THRESHOLD PULSEWIDTH: 0.4 ms
MDC IDC MSMT LEADCHNL RA SENSING INTR AMPL: 3.875 mV
MDC IDC MSMT LEADCHNL RA SENSING INTR AMPL: 5 mV
MDC IDC MSMT LEADCHNL RV PACING THRESHOLD AMPLITUDE: 1.25 V
MDC IDC SESS DTM: 20190319150821
MDC IDC STAT BRADY AP VS PERCENT: 0.01 %
MDC IDC STAT BRADY AS VS PERCENT: 4.28 %
MDC IDC STAT BRADY RA PERCENT PACED: 2.35 %

## 2018-02-27 MED ORDER — CARVEDILOL 6.25 MG PO TABS: 6.2500 mg | ORAL_TABLET | Freq: Two times a day (BID) | ORAL | 3 refills | Status: DC

## 2018-02-27 MED ORDER — SACUBITRIL-VALSARTAN 24-26 MG PO TABS: 1.0000 | ORAL_TABLET | Freq: Two times a day (BID) | ORAL | 3 refills | Status: DC

## 2018-02-27 MED ORDER — AMOXICILLIN 500 MG PO CAPS: 2000.0000 mg | ORAL_CAPSULE | Freq: Once | ORAL | 1 refills | Status: AC

## 2018-02-27 NOTE — Patient Instructions (Signed)
Medication Instructions:  Your physician recommends that you continue on your current medications as directed. Please refer to the Current Medication list given to you today.  Labwork: None ordered.  Testing/Procedures: None ordered.  Follow-Up: Your physician wants you to follow-up in: 9 months with Dr. Lovena Le.   You will receive a reminder letter in the mail two months in advance. If you don't receive a letter, please call our office to schedule the follow-up appointment.  Remote monitoring is used to monitor your Pacemaker from home. This monitoring reduces the number of office visits required to check your device to one time per year. It allows [place] to keep an eye on the functioning of your device to ensure it is working properly. You are scheduled for a device check from home on 05/29/2018. You may send your transmission at any time that day. If you have a wireless device, the transmission will be sent automatically. After your physician reviews your transmission, you will receive a postcard with your next transmission date.  Any Other Special Instructions Will Be Listed Below (If Applicable).  If you need a refill on your cardiac medications before your next appointment, please call your pharmacy.

## 2018-02-27 NOTE — Progress Notes (Signed)
HF MD: [facility]  HPI: 66 yoCaucasian Mwith history of CVA, paroxysmal atrial fibrillation, valvular heart disease, and chronic diastolic CHF presents for evaluation of CHF and valvular heart disease.   Episodes of dyspnea and initial workup led to a TEE in 5/18 showing normal EF with moderate AS and moderate MR. He continued to have episodic dyspnea and ended up admitted in 8/18 with shortness of breath and chest pressure. This led to a long, complicated hospitalization.He was noted to be volume overloaded with acute diastolic CHF and was also noted to have symptomatic runs of atrial fibrillation with RVR. Amiodarone was started to control the atrial fibrillation. He developed respiratory distress =>Bipap =>intubated. He became hypotensive, requiring pressors. He developed AKI. PNA was noted and he received broad spectrum abx.He had a prolonged intubation but was eventually extubated.Given deconditioning from long hospitalization, he went to CIR for a couple of weeks. LHC in 9/18 showed occluded PDA with left>right collaterals. TEE in 9/18 showed EF 50%, severe MR (possibly infarct-related with restricted posterior leaflet, and moderate AS + moderate-severe AI (possibly rheumatic).   In 12/18, he had cardiac surgery with bioprosthetic aortic and mitral valves placed. He had SVG-PDA, Maze, and LA appendage clipping. Post-op course was complicated by CHB requiring Medtronic dual chamber PPM.  Echo 01/02/18 LVEF 30-35%,bioprosthetic MV and AoV function normally, RV mildly dilated with normal systolic function.  On 01/15/18, he was started on carvedilol at 6.25 mg BID and losartan 25 mg daily QHS.He is doing well overall.He has started CR 3 days a week.He denies DOE,lightheadednessordizziness. No orthopnea or PND. No palpitations or CP.  He returns today forpharmacist-led HF medication titration.At last HF clinic visit on 02/13/18, he was switched from losartan to Entresto 24-26 mg BID.  Of note, on 02/26/18 CR reported low entry BP of 85/54 HR 72, asymptomatic. Patient given Gatorade, recheck BP 99/65 sitting, 86/50 standing. Patient given a cup of water, recheck BP 131/76 sitting, 135/76 standing. Per Mrs. Huwe, patient more fatigued since starting Entresto. Patient came to clinic for BMET, BUN slightly elevated, potentially 2/2 dehydration. Furosemide decreased to 20mg  daily per Dr. Aundra Dubin.  Patient states that he had eaten yesterday morning and had already taken his furosemide, so he will hold today and then decrease dose. Having dental procedure on 4/16, will need prophylactic antibiotics.    Shortness of breath/dyspnea on exertion?Yes, wife noticed some SOB yesterday when he called from CR, he had not exercised  Orthopnea/PND?no, not sleeping during the day or at night lately, has tried Tylenol PM and melatonin with no success. Trazodone was stopped so this may be the issue, advised patient to discuss restarting with PCP  Edema?no  Lightheadedness/dizziness?no  Daily weights at home?Yes- stable ~237 lb  Blood pressure/heart rate monitoring at home?Yes- home monitor 100/50-70 mmHg  Following low-sodium/fluid-restricteddiet? Yes - wife is "salt police", has had corned beef brisket since [employer] Day  HF Medications: Carvedilol 6.25 mg PO BID Entresto 24-26mg  BID Furosemide 20 mg PO daily  Has the patient been experiencing any side effects to the medications prescribed?Yes, increased fatigue since starting Entresto 2 weeks ago, but may be related to the d/c of trazodone  Does the patient have any problems obtaining medications due to transportation or finances?No- [employer] Medicare (will enroll in PANF when reopens), Entresto $45 at [facility], would like to transfer 90d/s to [facility]    Understanding of regimen:fair Understanding of indications:fair Potential of compliance:good Patient understands to avoid NSAIDs. Patient understands  to avoid decongestants.  Pertinent Lab Values:  02/26/18: Serum creatinine 1.56, BUN 24, Potassium 4.2  02/13/18:Serum creatinine1.54 (BL ~1.5-1.8),BUN 20, Potassium4.3, Sodium139  Vital Signs:  IOEVOJ:500 lb(dry weight at home 236 lb)  Blood pressure:102/62 mmHgsitting, 94/58 mmHg standing  Heart rate:71 bpm  Assessment: 1. Chronicsystolic CHF ([id number]%), due tovalvular disease. NYHA classIIsymptoms. - Volume status stable - Will have him continue to monitor BP at home and for s/s hypotension with recent decrease in furosemide - Continue Entresto 24-26mg  BID, carvedilol 6.25 mg BID and furosemide 20 mg daily - As BP tolerates, can consider addition of spironolactone in the future  - Basic disease state pathophysiology, medication indication, mechanism and side effects reviewed at length with patient and he verbalized understanding  2. Valvular heart disease: TEE in 9/18 showed severe MR, probably infarct-related with restrictive posterior mitral leaflet. There was moderate aortic stenosis and moderate-severe aortic insufficiency, possible rheumatic. In 12/18, he had bioprosthetic MVR and AVR.  - Echo post op showed EF 30-35% 01/02/18 -Started cardiac rehab last week  3. CKD: Stage 3. -BMET today - SCr stable at BL ~ 1.5-1.8  4. Atrial fibrillation: Paroxysmal. He tends to tolerate afib poorly. He is now maintaining NSR after Maze in 12/18. He is no longer on amiodarone.  - He will continue warfarin rather than DOAC given bioprosthetic valves.   5. COPD: Mild obstruction on PFTs. He has quit smoking. Stable. No change.  6. CVA: Remote, minimal residual. S/p carotid stenting.  - No ASA given warfarin use.   7. Hyperlipidemia:Good lipids in 1/19.  8. Anemia:Fe deficiency. FOBT negative 8/18. Got feraheme in 8/18. Should have eventual GI workup.  -In IV iron study, will receive Feraheme tomorrow (02/28/18)  9. 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 The artery to the PDA was occluded with left to right collaterals. He had SVG-PDA in 12/18.  -No s/s of ischemia. - Continue statin.  - No ASA given warfarin use.  10. CHB:has Medtronic PPM. We did not check this visit, will need to check next visit to see how much RV pacing that he is doing.  Plan: 1) Medication changes: Based on clinical presentation, vital signs and recent labs willcontinue current regimen. 2) Labs: PRN 3) Follow-up:Dr. Aundra Dubin on 03/15/18

## 2018-02-27 NOTE — Patient Instructions (Addendum)
It was great to see you again today!  Continue taking your Entresto, carvedilol, and the new lower 20mg  (1/2 tablet) of furosemide.  Continue checking your blood pressure and weights at home.  Call us with any questions or if you begin to have symptoms of low blood pressure.  Follow up with Dr. Aundra Dubin on 03/15/18.

## 2018-02-27 NOTE — Progress Notes (Signed)
HPI Mr. Binette returns today for followup of his PPM in the setting of high grade heart block. He underwent MV repair several months ago complicated by the development of high grade heart block. He then underwent PPM insertion with a His bundle lead placed. Unfortunately, the patient had capture of the right bundle resulting in pacing induced LBBB. He has had some problems with dehydration and not volume overload. He pressure were a little low and his dose of diuretic downtitrated. No peripheral edema lately. Allergies  Allergen Reactions  . Bee Venom Anaphylaxis and Swelling     Current Outpatient Medications  Medication Sig Dispense Refill  . acetaminophen (TYLENOL) 325 MG tablet Take 1 tablet (325 mg total) by mouth every 6 (six) hours as needed for mild pain. 30 tablet 0  . amoxicillin (AMOXIL) 500 MG capsule Take 4 capsules (2,000 mg total) by mouth once for 1 dose. 1 hour prior to dental procedure 4 capsule 1  . calcium carbonate (TUMS - DOSED IN MG ELEMENTAL CALCIUM) 500 MG chewable tablet Chew 1 tablet by mouth daily as needed for indigestion or heartburn.    . carvedilol (COREG) 6.25 MG tablet Take 1 tablet (6.25 mg total) by mouth 2 (two) times daily. 180 tablet 3  . cetirizine (ZYRTEC) 10 MG tablet Take 10 mg by mouth daily as needed for allergies.    . citalopram (CELEXA) 20 MG tablet Take 1 tablet (20 mg total) by mouth at bedtime. 30 tablet 5  . docusate sodium (COLACE) 100 MG capsule Take 1 capsule (100 mg total) by mouth 3 (three) times daily as needed. 20 capsule 0  . furosemide (LASIX) 20 MG tablet Take 1 tablet (20 mg total) by mouth daily. 30 tablet 3  . LORazepam (ATIVAN) 0.5 MG tablet Take 1 tablet (0.5 mg total) by mouth every 6 (six) hours as needed for anxiety (aggitation). 20 tablet 0  . lovastatin (MEVACOR) 40 MG tablet TAKE 1 TABLET AT BEDTIME  (NEW  DOSE) 90 tablet 3  . ondansetron (ZOFRAN) 4 MG tablet Take 4 mg by mouth every 8 (eight) hours as needed for  nausea or vomiting.    [name] oxyCODONE (OXY IR/ROXICODONE) 5 MG immediate release tablet Take 1 tablet (5 mg total) by mouth every 4 (four) hours as needed for severe pain. 30 tablet 0  . pantoprazole (PROTONIX) 40 MG tablet Take 1 tablet (40 mg total) by mouth daily. 30 tablet 1  . sacubitril-valsartan (ENTRESTO) 24-26 MG Take 1 tablet by mouth 2 (two) times daily. 180 tablet 3  . warfarin (COUMADIN) 5 MG tablet Take 1 tablet (5 mg total) by mouth daily at 6 PM. 40 tablet 3   No current facility-administered medications for this visit.      Past Medical History:  Diagnosis Date  . Anemia 07/13/2017  . Anxiety   . Aortic insufficiency   . Aortic stenosis, moderate 07/13/2017  . Arthritis    back   . Carotid stenosis    Right carotid stent (widely patent) 40 - 59% left plaque 11/13  . COPD (chronic obstructive pulmonary disease) ([place])   . Coronary artery disease involving native coronary artery of native heart without angina pectoris   . Depression   . Dyslipidemia   . GERD (gastroesophageal reflux disease)   . Heart murmur   . Hemiplegia affecting unspecified side, late effect of cerebrovascular disease    resolved- from L side   . Hypertension   . Jaundice  resolved following ERCP & Cholecystectomy  . Mild emphysema ([place])   . Mitral regurgitation   . Mitral valve insufficiency and aortic valve insufficiency   . Myocardial infarction ([place])   . Paroxysmal atrial fibrillation ([facility])   . Pre-diabetes    per spouse  . S/P aortic valve replacement with bioprosthetic valve 11/16/2017   23 mm [facility] Ease stented bovine pericardial tissue valve  . S/P CABG x 1 11/16/2017   SVG to PDA with EVH via right thigh  . S/P Maze operation for atrial fibrillation 11/16/2017   Left side lesion set using bipolar radiofrequency and cryothermy ablation with clipping of LA appendage  . S/P mitral valve replacement with bioprosthetic valve 11/16/2017   27 mm [facility] Mitral stented bovine  pericardial tissue valve  . Sleep apnea    does not wear CPAP  . Sleep concern    resulted in surgery- after + sleep test. Pt. doesn't have a problem any longer.   . Stroke ([place]) 03/11/2003   stent placed on the 31, 3, 2004, L side   . Wears glasses   . Wears hearing aid in both ears   . Wears partial dentures     ROS:   All systems reviewed and negative except as noted in the HPI.   Past Surgical History:  Procedure Laterality Date  . AORTIC VALVE REPLACEMENT N/A 11/16/2017   Procedure: AORTIC VALVE REPLACEMENT (AVR) with 73mm Magna Ease;  Surgeon: Rexene Alberts, MD;  Location: [place];  Service: Open Heart Surgery;  Laterality: N/A;  . ARTERIAL LINE INSERTION Right 11/16/2017   Procedure: ARTERIAL LINE INSERTION -RIGHT FEMORAL;  Surgeon: Rexene Alberts, MD;  Location: [place];  Service: Open Heart Surgery;  Laterality: Right;  . BACK SURGERY     lumbar back  . CHOLECYSTECTOMY    . CORONARY ARTERY BYPASS GRAFT N/A 11/16/2017   Procedure: CORONARY ARTERY BYPASS GRAFTING (CABG) x 1;  Surgeon: Rexene Alberts, MD;  Location: [place];  Service: Open Heart Surgery;  Laterality: N/A;  . ENDOVEIN HARVEST OF GREATER SAPHENOUS VEIN Right 11/16/2017   Procedure: ENDOVEIN HARVEST OF GREATER SAPHENOUS VEIN;  Surgeon: Rexene Alberts, MD;  Location: [place];  Service: Open Heart Surgery;  Laterality: Right;  . ERCP N/A 05/31/2013   Procedure: ENDOSCOPIC RETROGRADE CHOLANGIOPANCREATOGRAPHY (ERCP);  Surgeon: Ladene Artist, MD;  Location: [facility];  Service: Endoscopy;  Laterality: N/A;  . FOOT SURGERY     right  . IR RADIOLOGY PERIPHERAL GUIDED IV START  09/28/2017  . IR US GUIDE VASC ACCESS RIGHT  09/28/2017  . LAPAROSCOPIC CHOLECYSTECTOMY SINGLE PORT N/A 06/01/2013   Procedure: LAPAROSCOPIC CHOLECYSTECTOMY SINGLE PORT;  Surgeon: Adin Hector, MD;  Location: [facility];  Service: General;  Laterality: N/A;  . MAZE N/A 11/16/2017   Procedure: MAZE;  Surgeon: Rexene Alberts, MD;  Location: [place];  Service: Open Heart Surgery;  Laterality: N/A;  . MITRAL VALVE REPAIR N/A 11/16/2017   Procedure: MITRAL VALVE  REPLACEMENT with 57mm MagnaEase;  Surgeon: Rexene Alberts, MD;  Location: [place];  Service: Open Heart Surgery;  Laterality: N/A;  . PACEMAKER IMPLANT N/A 11/21/2017   Procedure: PACEMAKER IMPLANT;  Surgeon: Evans Lance, MD;  Location: [place] CV [facility];  Service: Cardiovascular;  Laterality: N/A;  . POLYPECTOMY    . RIGHT/LEFT HEART CATH AND CORONARY ANGIOGRAPHY N/A 08/30/2017   Procedure: RIGHT/LEFT HEART CATH AND CORONARY ANGIOGRAPHY;  Surgeon: Larey Dresser, MD;  Location:  [place] [facility];  Service: Cardiovascular;  Laterality: N/A;  . SHOULDER ARTHROSCOPY WITH ROTATOR CUFF REPAIR AND SUBACROMIAL DECOMPRESSION Left 05/18/2017  . SHOULDER ARTHROSCOPY WITH ROTATOR CUFF REPAIR AND SUBACROMIAL DECOMPRESSION Left 05/18/2017   Procedure: SHOULDER ARTHROSCOPY WITH ROTATOR CUFF REPAIR AND SUBACROMIAL DECOMPRESSION;  Surgeon: Tania Ade, MD;  Location: [place];  Service: Orthopedics;  Laterality: Left;  LEFT SHOULDER ARTHROSCOPY WITH ROTATOR CUFF REPAIR AND SUBACROMIAL DECOMPRESSION  . TEE WITHOUT CARDIOVERSION N/A 05/09/2017   Procedure: TRANSESOPHAGEAL ECHOCARDIOGRAM (TEE);  Surgeon: Skeet Latch, MD;  Location: [place];  Service: Cardiovascular;  Laterality: N/A;  . TEE WITHOUT CARDIOVERSION N/A 08/30/2017   Procedure: TRANSESOPHAGEAL ECHOCARDIOGRAM (TEE);  Surgeon: Larey Dresser, MD;  Location[facility];  Service: Cardiovascular;  Laterality: N/A;  . TEE WITHOUT CARDIOVERSION N/A 11/16/2017   Procedure: TRANSESOPHAGEAL ECHOCARDIOGRAM (TEE);  Surgeon: Rexene Alberts, MD;  Location: [place];  Service: Open Heart Surgery;  Laterality: N/A;  . TONSILLECTOMY       Family History  Problem Relation Age of Onset  . Stroke Father        No details  . Angina Mother      Social History   Socioeconomic History  . Marital status: Married    Spouse name: Not  on file  . Number of children: 3  . Years of education: Not on file  . Highest education level: Not on file  Social Needs  . Financial resource strain: Not on file  . Food insecurity - worry: Not on file  . Food insecurity - inability: Not on file  . Transportation needs - medical: Not on file  . Transportation needs - non-medical: Not on file  Occupational History    Employer: RETIRED  Tobacco Use  . Smoking status: Former Smoker    Packs/day: 0.50    Years: 57.00    Pack years: 28.50    Types: Cigarettes    Last attempt to quit: 07/13/1997    Years since quitting: 20.6  . Smokeless tobacco: Never Used  Substance and Sexual Activity  . Alcohol use: Yes    Alcohol/week: 1.2 - 1.8 oz    Types: 2 - 3 Glasses of wine per week    Comment: moderate wine ; not since 8/ 2018  . Drug use: No  . Sexual activity: Yes  Other Topics Concern  . Not on file  Social History Narrative   Two living children.  Lives with wife.       BP 122/66   Pulse 69   Ht 6' (1.829 m)   Wt 243 lb (110.2 kg)   BMI 32.96 kg/m   Physical Exam:  Well appearing 72 yo man, NAD HEENT: Unremarkable Neck:  6 cm JVD, no thyromegally Lymphatics:  No adenopathy Back:  No CVA tenderness Lungs:  Clear with no wheezes HEART:  Regular rate rhythm, no murmurs, no rubs, no clicks Abd:  soft, positive bowel sounds, no organomegally, no rebound, no guarding Ext:  2 plus pulses, no edema, no cyanosis, no clubbing Skin:  No rashes no nodules Neuro:  CN II through XII intact, motor grossly intact  EKG - nsr with pacing induced LBBB  DEVICE  Normal device function.  See PaceArt for details.   Assess/Plan: 1. High grade heart block - his conduction has improved since DC from the hospital. I have reprogrammed his device to allow for intrinsic conduction. 2. PPM - his Medtronic DDD PM is working normally. We have reprogrammed his device to  allow for intrinsic conduction by increasing the AV delay. I have asked  that he call us if he feels worse. 3. PAF - he appears to be maintaining NSR. He is s/p MAZE.  He will continue his current meds. 4. CAD - he is s/p bypass grafting and denies anginal symptoms.  Brandon [name].D.

## 2018-02-28 ENCOUNTER — Ambulatory Visit (HOSPITAL_COMMUNITY)
Admission: RE | Admit: 2018-02-28 | Discharge: 2018-02-28 | Disposition: A | Discharge status: Home / Self Care | Payer: Medicare HMO | Source: Ambulatory Visit | Attending: Cardiology | Admitting: Cardiology

## 2018-02-28 ENCOUNTER — Encounter (HOSPITAL_COMMUNITY): Payer: Medicare HMO

## 2018-02-28 DIAGNOSIS — D509 Iron deficiency anemia, unspecified: Secondary | ICD-10-CM | POA: Insufficient documentation

## 2018-02-28 MED ORDER — SODIUM CHLORIDE 0.9 % IV SOLN
510.0000 mg | INTRAVENOUS | Status: DC
  Administered 2018-02-28: 510 mg via INTRAVENOUS
  Filled 2018-02-28: qty 17

## 2018-03-01 ENCOUNTER — Encounter (HOSPITAL_COMMUNITY): Payer: Self-pay | Admitting: *Deleted

## 2018-03-01 DIAGNOSIS — Z952 Presence of prosthetic heart valve: Secondary | ICD-10-CM

## 2018-03-01 DIAGNOSIS — Z951 Presence of aortocoronary bypass graft: Secondary | ICD-10-CM

## 2018-03-01 NOTE — Progress Notes (Signed)
Cardiac Individual Treatment Plan  Patient Details  Name: Brandon [name] MRN: [id number] Date of Birth: Jul 20, 1946 Referring Provider:     [place] from 02/01/2018 in [place]  Referring Provider  Wyatt Haste MD and Loralie Champagne MD      Initial Encounter Date:    CARDIAC REHAB PHASE II ORIENTATION from 02/01/2018 in [place]  Date  02/01/18  Referring Provider  Wyatt Haste MD and Loralie Champagne MD      Visit Diagnosis: S/P CABG x 1  S/P AVR (aortic valve replacement)  S/P MVR (mitral valve replacement)  Patient's Home Medications on Admission:  Current Outpatient Medications:  .  acetaminophen (TYLENOL) 325 MG tablet, Take 1 tablet (325 mg total) by mouth every 6 (six) hours as needed for mild pain., Disp: 30 tablet, Rfl: 0 .  calcium carbonate (TUMS - DOSED IN MG ELEMENTAL CALCIUM) 500 MG chewable tablet, Chew 1 tablet by mouth daily as needed for indigestion or heartburn., Disp: , Rfl:  .  carvedilol (COREG) 6.25 MG tablet, Take 1 tablet (6.25 mg total) by mouth 2 (two) times daily., Disp: 180 tablet, Rfl: 3 .  cetirizine (ZYRTEC) 10 MG tablet, Take 10 mg by mouth daily as needed for allergies., Disp: , Rfl:  .  citalopram (CELEXA) 20 MG tablet, Take 1 tablet (20 mg total) by mouth at bedtime., Disp: 30 tablet, Rfl: 5 .  docusate sodium (COLACE) 100 MG capsule, Take 1 capsule (100 mg total) by mouth 3 (three) times daily as needed., Disp: 20 capsule, Rfl: 0 .  furosemide (LASIX) 20 MG tablet, Take 1 tablet (20 mg total) by mouth daily., Disp: 30 tablet, Rfl: 3 .  LORazepam (ATIVAN) 0.5 MG tablet, Take 1 tablet (0.5 mg total) by mouth every 6 (six) hours as needed for anxiety (aggitation)., Disp: 20 tablet, Rfl: 0 .  lovastatin (MEVACOR) 40 MG tablet, TAKE 1 TABLET AT BEDTIME  (NEW  DOSE), Disp: 90 tablet, Rfl: 3 .  ondansetron (ZOFRAN) 4 MG tablet, Take 4 mg by mouth every 8 (eight)  hours as needed for nausea or vomiting., Disp: , Rfl:  .  oxyCODONE (OXY IR/ROXICODONE) 5 MG immediate release tablet, Take 1 tablet (5 mg total) by mouth every 4 (four) hours as needed for severe pain., Disp: 30 tablet, Rfl: 0 .  pantoprazole (PROTONIX) 40 MG tablet, Take 1 tablet (40 mg total) by mouth daily., Disp: 30 tablet, Rfl: 1 .  sacubitril-valsartan (ENTRESTO) 24-26 MG, Take 1 tablet by mouth 2 (two) times daily., Disp: 180 tablet, Rfl: 3 .  traZODone (DESYREL) 100 MG tablet, Take 40 mg by mouth at bedtime., Disp: , Rfl:  .  warfarin (COUMADIN) 5 MG tablet, Take 1 tablet (5 mg total) by mouth daily at 6 PM., Disp: 40 tablet, Rfl: 3  Past Medical History: Past Medical History:  Diagnosis Date  . Anemia 07/13/2017  . Anxiety   . Aortic insufficiency   . Aortic stenosis, moderate 07/13/2017  . Arthritis    back   . Carotid stenosis    Right carotid stent (widely patent) 40 - 59% left plaque 11/13  . COPD (chronic obstructive pulmonary disease) ([place])   . Coronary artery disease involving native coronary artery of native heart without angina pectoris   . Depression   . Dyslipidemia   . GERD (gastroesophageal reflux disease)   . Heart murmur   . Hemiplegia affecting unspecified side, late effect of cerebrovascular  disease    resolved- from L side   . Hypertension   . Jaundice    resolved following ERCP & Cholecystectomy  . Mild emphysema ([place])   . Mitral regurgitation   . Mitral valve insufficiency and aortic valve insufficiency   . Myocardial infarction ([place])   . Paroxysmal atrial fibrillation ([facility])   . Pre-diabetes    per spouse  . S/P aortic valve replacement with bioprosthetic valve 11/16/2017   23 mm [facility] Ease stented bovine pericardial tissue valve  . S/P CABG x 1 11/16/2017   SVG to PDA with EVH via right thigh  . S/P [place] operation for atrial fibrillation 11/16/2017   Left side lesion set using bipolar radiofrequency and cryothermy ablation with clipping of LA  appendage  . S/P mitral valve replacement with bioprosthetic valve 11/16/2017   27 mm [facility] Mitral stented bovine pericardial tissue valve  . Sleep apnea    does not wear CPAP  . Sleep concern    resulted in surgery- after + sleep test. Pt. doesn't have a problem any longer.   . Stroke ([place]) 03/11/2003   stent placed on the 31, 3, 2004, L side   . Wears glasses   . Wears hearing aid in both ears   . Wears partial dentures     Tobacco Use: Social History   Tobacco Use  Smoking Status Former Smoker  . Packs/day: 0.50  . Years: 57.00  . Pack years: 28.50  . Types: Cigarettes  . Last attempt to quit: 07/13/1997  . Years since quitting: 20.6  Smokeless Tobacco Never Used    Labs: Recent Review Flowsheet Data    Labs for ITP Cardiac and Pulmonary Rehab Latest Ref Rng & Units 11/16/2017 11/17/2017 11/17/2017 11/17/2017 12/26/2017   Cholestrol 0 - 200 mg/dL - - - - 132   LDLCALC 0 - 99 mg/dL - - - - 66   HDL >40 mg/dL - - - - 47   Trlycerides <150 mg/dL - - - - 97   Hemoglobin A1c 4.8 - 5.6 % - - - - -   PHART 7.350 - 7.450 - 7.381 7.308(L) - -   PCO2ART 32.0 - 48.0 mmHg - 41.1 40.8 - -   HCO3 20.0 - 28.0 mmol/L - 24.4 20.5 - -   TCO2 22 - 32 mmol/L 23 26 22 22  -   ACIDBASEDEF 0.0 - 2.0 mmol/L - 1.0 5.0(H) - -   O2SAT % - 99.0 98.0 - -      Capillary Blood Glucose: Lab Results  Component Value Date   GLUCAP 114 (H) 11/21/2017   GLUCAP 164 (H) 11/20/2017   GLUCAP 99 11/20/2017   GLUCAP 61 (L) 11/20/2017   GLUCAP 168 (H) 11/20/2017     Exercise Target Goals:    Exercise Program Goal: Individual exercise prescription set using results from initial 6 min walk test and THRR while considering  patient's activity barriers and safety.   Exercise Prescription Goal: Initial exercise prescription builds to 30-45 minutes a day of aerobic activity, 2-3 days per week.  Home exercise guidelines will be given to patient during program as part of exercise prescription that the  participant will acknowledge.  Activity Barriers & Risk Stratification: Activity Barriers & Cardiac Risk Stratification - 02/01/18 0937      Activity Barriers & Cardiac Risk Stratification   Activity Barriers  Deconditioning;Muscular Weakness;Back Problems;Other (comment)    Comments  L shoulder limitations (RTC); R knee discomfort    Cardiac  Risk Stratification  High       6 Minute Walk: 6 Minute Walk    Row Name 02/01/18 1146         6 Minute Walk   Phase  Initial     Distance  1322 feet     Walk Time  6 minutes     # of Rest Breaks  0     MPH  2.5     METS  1.6     RPE  11     VO2 Peak  5.7     Resting HR  62 bpm     Resting BP  128/70     Resting Oxygen Saturation   96 %     Exercise Oxygen Saturation  during 6 min walk  100 %     Max Ex. HR  92 bpm     Max Ex. BP  128/74     2 Minute Post BP  118/68        Oxygen Initial Assessment:   Oxygen Re-Evaluation:   Oxygen Discharge (Final Oxygen Re-Evaluation):   Initial Exercise Prescription: Initial Exercise Prescription - 02/01/18 1100      Date of Initial Exercise RX and Referring Provider   Date  02/01/18    Referring Provider  Wyatt Haste MD and Loralie Champagne MD      Bike   Level  0.3    Minutes  10    METs  1.51      NuStep   Level  2    SPM  70    Minutes  10    METs  1.5      Track   Laps  7    Minutes  10    METs  2.23      Prescription Details   Frequency (times per week)  3    Duration  Progress to 30 minutes of continuous aerobic without signs/symptoms of physical distress      Intensity   THRR 40-80% of Max Heartrate  60-119    Ratings of Perceived Exertion  11-13    Perceived Dyspnea  0-4      Progression   Progression  Continue to progress workloads to maintain intensity without signs/symptoms of physical distress.      Resistance Training   Training Prescription  Yes    Weight  2lbs    Reps  10-15       Perform Capillary Blood Glucose checks as needed.  Exercise  Prescription Changes:  Exercise Prescription Changes    Row Name 02/05/18 1600 02/15/18 1600 02/23/18 0730         Response to Exercise   Blood Pressure (Admit)  104/70  110/60  100/70     Blood Pressure (Exercise)  126/70  126/78  132/78     Blood Pressure (Exit)  120/64  104/70  98/97     Heart Rate (Admit)  75 bpm  62 bpm  70 bpm     Heart Rate (Exercise)  98 bpm  94 bpm  91 bpm     Heart Rate (Exit)  75 bpm  70 bpm  67 bpm     Rating of Perceived Exertion (Exercise)  11  12  12      Perceived Dyspnea (Exercise)  0  0  0     Symptoms  None   -  -     Comments  Pt oriented to exericse equipment  -  -  Duration  Progress to 30 minutes of  aerobic without signs/symptoms of physical distress  Progress to 30 minutes of  aerobic without signs/symptoms of physical distress  Continue with 30 min of aerobic exercise without signs/symptoms of physical distress.     Intensity  THRR New  THRR unchanged  THRR unchanged       Progression   Progression  Continue to progress workloads to maintain intensity without signs/symptoms of physical distress.  Continue to progress workloads to maintain intensity without signs/symptoms of physical distress.  Continue to progress workloads to maintain intensity without signs/symptoms of physical distress.     Average METs  2.03  2.34  2.28       Resistance Training   Training Prescription  Yes  No  No     Weight  2lbs  -  -     Reps  10-15  -  -     Time  10 Minutes  -  -       Interval Training   Interval Training  No  No  No       Bike   Level  0.3  0.8  0.8     Minutes  10  15  15      METs  1.52  2.38  2.37       NuStep   Level  2  3  3      SPM  75  80  85     Minutes  10  15  15      METs  2  2.3  2.2       Track   Laps  9  -  -     Minutes  10  -  -     METs  2.53  -  -       Home Exercise Plan   Plans to continue exercise at  -  -  Home (comment) Walking     Frequency  -  -  Add 2 additional days to program exercise sessions.      Initial Home Exercises Provided  -  -  02/21/18        Exercise Comments:  Exercise Comments    Row Name 02/05/18 1618 02/21/18 1641 03/01/18 0733       Exercise Comments  Today was pt's first day of rehab. Pt was oriented to exericse equipment. Pt is off to great start.   Reviewed HEP with pt. Pt voiced understanding and plans to start exercising at home in addition to cardiac rehab exercise. Will continue to monitor and follow up with pt about HEP.   Pt is responding well to exericse prescription. Pt states he is feeling stronger since starting rehab. Will continue to progress pt as tolerated and monitor.         Exercise Goals and Review:  Exercise Goals    Row Name 02/01/18 [phone]             Exercise Goals   Increase Physical Activity  Yes       Intervention  Provide advice, education, support and counseling about physical activity/exercise needs.;Develop an individualized exercise prescription for aerobic and resistive training based on initial evaluation findings, risk stratification, comorbidities and participant's personal goals.       Expected Outcomes  Short Term: Attend rehab on a regular basis to increase amount of physical activity.;Long Term: Add in home exercise to make exercise part of routine and to increase amount of physical  activity.;Long Term: Exercising regularly at least 3-5 days a week.       Increase Strength and Stamina  Yes       Intervention  Provide advice, education, support and counseling about physical activity/exercise needs.;Develop an individualized exercise prescription for aerobic and resistive training based on initial evaluation findings, risk stratification, comorbidities and participant's personal goals.       Expected Outcomes  Short Term: Increase workloads from initial exercise prescription for resistance, speed, and METs.;Short Term: Perform resistance training exercises routinely during rehab and add in resistance training at home;Long Term:  Improve cardiorespiratory fitness, muscular endurance and strength as measured by increased METs and functional capacity (6MWT)       Able to understand and use rate of perceived exertion (RPE) scale  Yes       Intervention  Provide education and explanation on how to use RPE scale       Expected Outcomes  Short Term: Able to use RPE daily in rehab to express subjective intensity level;Long Term:  Able to use RPE to guide intensity level when exercising independently       Knowledge and understanding of Target Heart Rate Range (THRR)  Yes       Intervention  Provide education and explanation of THRR including how the numbers were predicted and where they are located for reference       Expected Outcomes  Short Term: Able to state/look up THRR;Short Term: Able to use daily as guideline for intensity in rehab;Long Term: Able to use THRR to govern intensity when exercising independently       Able to check pulse independently  Yes       Intervention  Provide education and demonstration on how to check pulse in carotid and radial arteries.;Review the importance of being able to check your own pulse for safety during independent exercise       Expected Outcomes  Short Term: Able to explain why pulse checking is important during independent exercise;Long Term: Able to check pulse independently and accurately       Understanding of Exercise Prescription  Yes       Intervention  Provide education, explanation, and written materials on patient's individual exercise prescription       Expected Outcomes  Short Term: Able to explain program exercise prescription;Long Term: Able to explain home exercise prescription to exercise independently          Exercise Goals Re-Evaluation : Exercise Goals Re-Evaluation    Row Name 02/21/18 1642 03/01/18 0737           Exercise Goal Re-Evaluation   Exercise Goals Review  Understanding of Exercise Prescription;Increase Physical Activity;Able to understand and use  rate of perceived exertion (RPE) scale;Knowledge and understanding of Target Heart Rate Range (THRR);Increase Strength and Stamina  Understanding of Exercise Prescription;Increase Physical Activity;Increase Strength and Stamina      Comments  Reveiwed HEP with pt, in addition to THRR, RPE scale, weather precautions, NTG use, endpoints of exercise, warmup and cool down.   Pt is able to exericse for 30 minutes with minimal difficulty. Will continue to work with pt on progress workloads.       Expected Outcomes  Pt plans to exercise 2 additional days at home by walking. Pt will continue increase his physical activity on nonrehab days. Will continue to follow and monitor pt.   Pt will continue to work on increasing his physical activity on nonrehab days. Pt will continue ot increase cardiorespiratory fitness.  Discharge Exercise Prescription (Final Exercise Prescription Changes): Exercise Prescription Changes - 02/23/18 0730      Response to Exercise   Blood Pressure (Admit)  100/70    Blood Pressure (Exercise)  132/78    Blood Pressure (Exit)  98/97    Heart Rate (Admit)  70 bpm    Heart Rate (Exercise)  91 bpm    Heart Rate (Exit)  67 bpm    Rating of Perceived Exertion (Exercise)  12    Perceived Dyspnea (Exercise)  0    Duration  Continue with 30 min of aerobic exercise without signs/symptoms of physical distress.    Intensity  THRR unchanged      Progression   Progression  Continue to progress workloads to maintain intensity without signs/symptoms of physical distress.    Average METs  2.28      Resistance Training   Training Prescription  No      Interval Training   Interval Training  No      Bike   Level  0.8    Minutes  15    METs  2.37      NuStep   Level  3    SPM  85    Minutes  15    METs  2.2      Home Exercise Plan   Plans to continue exercise at  Home (comment) Walking    Frequency  Add 2 additional days to program exercise sessions.    Initial Home  Exercises Provided  02/21/18       Nutrition:  Target Goals: Understanding of nutrition guidelines, daily intake of sodium 1500mg , cholesterol 200mg , calories 30% from fat and 7% or less from saturated fats, daily to have 5 or more servings of fruits and vegetables.  Biometrics: Pre Biometrics - 02/01/18 1153      Pre Biometrics   Height  5' 8.5" (1.74 m)    Weight  242 lb 1 oz (109.8 kg)    Waist Circumference  48 inches    Hip Circumference  45 inches    Waist to Hip Ratio  1.07 %    BMI (Calculated)  36.27    Triceps Skinfold  30 mm    % Body Fat  45.2 %    Grip Strength  30 kg    Flexibility  8 in    Single Leg Stand  30 seconds        Nutrition Therapy Plan and Nutrition Goals: Nutrition Therapy & Goals - 02/12/18 1209      Nutrition Therapy   Diet  Heart Healthy    Drug/Food Interactions  Coumadin/Vit K      Personal Nutrition Goals   Nutrition Goal  Pt to identify and limit food sources of saturated fat, trans fat, and sodium    Personal Goal #2  Pt to identify food quantities necessary to achieve weight loss of 6-24 lb (2.7-10.9 kg) at graduation from cardiac rehab. Goal wt of 200 lb desired.       Intervention Plan   Intervention  Prescribe, educate and counsel regarding individualized specific dietary modifications aiming towards targeted core components such as weight, hypertension, lipid management, diabetes, heart failure and other comorbidities.;Nutrition handout(s) given to patient. Pre-diabetes    Expected Outcomes  Short Term Goal: Understand basic principles of dietary content, such as calories, fat, sodium, cholesterol and nutrients.;Long Term Goal: Adherence to prescribed nutrition plan.       Nutrition Assessments: Nutrition Assessments - 02/01/18 1113  MEDFICTS Scores   Pre Score  48       Nutrition Goals Re-Evaluation:   Nutrition Goals Re-Evaluation:   Nutrition Goals Discharge (Final Nutrition Goals  Re-Evaluation):   Psychosocial: Target Goals: Acknowledge presence or absence of significant depression and/or stress, maximize coping skills, provide positive support system. Participant is able to verbalize types and ability to use techniques and skills needed for reducing stress and depression.  Initial Review & Psychosocial Screening: Initial Psych Review & Screening - 02/01/18 0900      Initial Review   Current issues with  None Identified      Family Dynamics   Good Support System?  Yes wife and family       Barriers   Psychosocial barriers to participate in program  There are no identifiable barriers or psychosocial needs.      Screening Interventions   Interventions  Encouraged to exercise    Expected Outcomes  Short Term goal: Identification and review with participant of any Quality of Life or Depression concerns found by scoring the questionnaire.;Long Term goal: The participant improves quality of Life and PHQ9 Scores as seen by post scores and/or verbalization of changes       Quality of Life Scores: Quality of Life - 02/01/18 0900      Quality of Life Scores   Health/Function Pre  24.9 %    Socioeconomic Pre  28.19 %    Psych/Spiritual Pre  26.14 %    Family Pre  24 %    GLOBAL Pre  25.77 %      Scores of 19 and below usually indicate a poorer quality of life in these areas.  A difference of  2-3 points is a clinically meaningful difference.  A difference of 2-3 points in the total score of the Quality of Life Index has been associated with significant improvement in overall quality of life, self-image, physical symptoms, and general health in studies assessing change in quality of life.  PHQ-9: Recent Review Flowsheet Data    Depression screen [facility] 2/9 02/05/2018   Decreased Interest 0   Down, Depressed, Hopeless 0   PHQ - 2 Score 0     Interpretation of Total Score  Total Score Depression Severity:  1-4 = Minimal depression, 5-9 = Mild depression, 10-14 =  Moderate depression, 15-19 = Moderately severe depression, 20-27 = Severe depression   Psychosocial Evaluation and Intervention:   Psychosocial Re-Evaluation: Psychosocial Re-Evaluation    [place] Name 03/01/18 1414             Psychosocial Re-Evaluation   Current issues with  Current Stress Concerns       Comments  Will continue to moitnor the patient for signs of stress as no complaints have been voiced at cardiac rehab       Interventions  Encouraged to attend Cardiac Rehabilitation for the exercise;Stress management education         Initial Review   Source of Stress Concerns  Chronic Illness          Psychosocial Discharge (Final Psychosocial Re-Evaluation): Psychosocial Re-Evaluation - 03/01/18 1414      Psychosocial Re-Evaluation   Current issues with  Current Stress Concerns    Comments  Will continue to moitnor the patient for signs of stress as no complaints have been voiced at cardiac rehab    Interventions  Encouraged to attend Cardiac Rehabilitation for the exercise;Stress management education      Initial Review   Source of Stress  Concerns  Chronic Illness       Vocational Rehabilitation: Provide vocational rehab assistance to qualifying candidates.   Vocational Rehab Evaluation & Intervention: Vocational Rehab - 02/01/18 0900      Initial Vocational Rehab Evaluation & Intervention   Assessment shows need for Vocational Rehabilitation  No retired        Education: Education Goals: Education classes will be provided on a weekly basis, covering required topics. Participant will state understanding/return demonstration of topics presented.  Learning Barriers/Preferences: Learning Barriers/Preferences - 02/01/18 0936      Learning Barriers/Preferences   Learning Barriers  Sight;Hearing glasses; hearing aids    Learning Preferences  Skilled Demonstration;Verbal Instruction;Individual Instruction       Education Topics: Count Your Pulse:  -Group  instruction provided by verbal instruction, demonstration, patient participation and written materials to support subject.  Instructors address importance of being able to find your pulse and how to count your pulse when at home without a heart monitor.  Patients get hands on experience counting their pulse with staff help and individually.   CARDIAC REHAB PHASE II EXERCISE from 02/23/2018 in [place]  Date  02/09/18  Instruction Review Code  2- Demonstrated Understanding      Heart Attack, Angina, and Risk Factor Modification:  -Group instruction provided by verbal instruction, video, and written materials to support subject.  Instructors address signs and symptoms of angina and heart attacks.    Also discuss risk factors for heart disease and how to make changes to improve heart health risk factors.   CARDIAC REHAB PHASE II EXERCISE from 02/23/2018 in [place]  Date  02/07/18  Educator  RN  Instruction Review Code  2- Demonstrated Understanding      Functional Fitness:  -Group instruction provided by verbal instruction, demonstration, patient participation, and written materials to support subject.  Instructors address safety measures for doing things around the house.  Discuss how to get up and down off the floor, how to pick things up properly, how to safely get out of a chair without assistance, and balance training.   CARDIAC REHAB PHASE II EXERCISE from 02/23/2018 in [place]  Date  02/23/18  Instruction Review Code  2- Demonstrated Understanding      Meditation and Mindfulness:  -Group instruction provided by verbal instruction, patient participation, and written materials to support subject.  Instructor addresses importance of mindfulness and meditation practice to help reduce stress and improve awareness.  Instructor also leads participants through a meditation exercise.     Stretching for Flexibility and Mobility:  -Group instruction provided by verbal instruction, patient participation, and written materials to support subject.  Instructors lead participants through series of stretches that are designed to increase flexibility thus improving mobility.  These stretches are additional exercise for major muscle groups that are typically performed during regular warm up and cool down.   Hands Only CPR:  -Group verbal, video, and participation provides a basic overview of AHA guidelines for community CPR. Role-play of emergencies allow participants the opportunity to practice calling for help and chest compression technique with discussion of AED use.   Hypertension: -Group verbal and written instruction that provides a basic overview of hypertension including the most recent diagnostic guidelines, risk factor reduction with self-care instructions and medication management.    Nutrition I class: Heart Healthy Eating:  -Group instruction provided by PowerPoint slides, verbal discussion, and written materials to support subject matter.  The instructor gives an explanation and review of the Therapeutic Lifestyle Changes diet recommendations, which includes a discussion on lipid goals, dietary fat, sodium, fiber, plant stanol/sterol esters, sugar, and the components of a well-balanced, healthy diet.   Nutrition II class: Lifestyle Skills:  -Group instruction provided by PowerPoint slides, verbal discussion, and written materials to support subject matter. The instructor gives an explanation and review of label reading, grocery shopping for heart health, heart healthy recipe modifications, and ways to make healthier choices when eating out.   Diabetes Question & Answer:  -Group instruction provided by PowerPoint slides, verbal discussion, and written materials to support subject matter. The instructor gives an explanation and review of diabetes co-morbidities, pre-  and post-prandial blood glucose goals, pre-exercise blood glucose goals, signs, symptoms, and treatment of hypoglycemia and hyperglycemia, and foot care basics.   Diabetes Blitz:  -Group instruction provided by PowerPoint slides, verbal discussion, and written materials to support subject matter. The instructor gives an explanation and review of the physiology behind type 1 and type 2 diabetes, diabetes medications and rational behind using different medications, pre- and post-prandial blood glucose recommendations and Hemoglobin A1c goals, diabetes diet, and exercise including blood glucose guidelines for exercising safely.    Portion Distortion:  -Group instruction provided by PowerPoint slides, verbal discussion, written materials, and food models to support subject matter. The instructor gives an explanation of serving size versus portion size, changes in portions sizes over the last 20 years, and what consists of a serving from each food group.   Stress Management:  -Group instruction provided by verbal instruction, video, and written materials to support subject matter.  Instructors review role of stress in heart disease and how to cope with stress positively.     Exercising on Your Own:  -Group instruction provided by verbal instruction, power point, and written materials to support subject.  Instructors discuss benefits of exercise, components of exercise, frequency and intensity of exercise, and end points for exercise.  Also discuss use of nitroglycerin and activating EMS.  Review options of places to exercise outside of rehab.  Review guidelines for sex with heart disease.   CARDIAC REHAB PHASE II EXERCISE from 02/23/2018 in [facility]  Date  02/14/18  Instruction Review Code  2- Demonstrated Understanding      Cardiac Drugs I:  -Group instruction provided by verbal instruction and written materials to support subject.  Instructor reviews cardiac drug  classes: antiplatelets, anticoagulants, beta blockers, and statins.  Instructor discusses reasons, side effects, and lifestyle considerations for each drug class.   Cardiac Drugs II:  -Group instruction provided by verbal instruction and written materials to support subject.  Instructor reviews cardiac drug classes: angiotensin converting enzyme inhibitors (ACE-I), angiotensin II receptor blockers (ARBs), nitrates, and calcium channel blockers.  Instructor discusses reasons, side effects, and lifestyle considerations for each drug class.   Anatomy and Physiology of the Circulatory System:  Group verbal and written instruction and models provide basic cardiac anatomy and physiology, with the coronary electrical and arterial systems. Review of: AMI, Angina, Valve disease, Heart Failure, Peripheral Artery Disease, Cardiac Arrhythmia, Pacemakers, and the ICD.   Other Education:  -Group or individual verbal, written, or video instructions that support the educational goals of the cardiac rehab program.   CARDIAC REHAB PHASE II EXERCISE from 02/23/2018 in [facility]  Date  02/21/18  Educator  CVA video  Instruction Review Code  2- Demonstrated Understanding  Holiday Eating Survival Tips:  -Group instruction provided by PowerPoint slides, verbal discussion, and written materials to support subject matter. The instructor gives patients tips, tricks, and techniques to help them not only survive but enjoy the holidays despite the onslaught of food that accompanies the holidays.   Knowledge Questionnaire Score: Knowledge Questionnaire Score - 02/01/18 0859      Knowledge Questionnaire Score   Pre Score  22/24       Core Components/Risk Factors/Patient Goals at Admission: Personal Goals and Risk Factors at Admission - 02/01/18 1154      Core Components/Risk Factors/Patient Goals on Admission   Tobacco Cessation  Yes quit date 07/2017    Number of packs per  day  1/2 ppd x 50 years    Intervention  Assist the participant in steps to quit. Provide individualized education and counseling about committing to Tobacco Cessation, relapse prevention, and pharmacological support that can be provided by physician.;Advice worker, assist with locating and accessing local/national Quit Smoking programs, and support quit date choice.    Expected Outcomes  Short Term: Will demonstrate readiness to quit, by selecting a quit date.;Short Term: Will quit all tobacco product use, adhering to prevention of relapse plan.;Long Term: Complete abstinence from all tobacco products for at least 12 months from quit date.    Heart Failure  Yes    Intervention  Provide a combined exercise and nutrition program that is supplemented with education, support and counseling about heart failure. Directed toward relieving symptoms such as shortness of breath, decreased exercise tolerance, and extremity edema.    Expected Outcomes  Improve functional capacity of life;Short term: Attendance in program 2-3 days a week with increased exercise capacity. Reported lower sodium intake. Reported increased fruit and vegetable intake. Reports medication compliance.;Short term: Daily weights obtained and reported for increase. Utilizing diuretic protocols set by physician.;Long term: Adoption of self-care skills and reduction of barriers for early signs and symptoms recognition and intervention leading to self-care maintenance.    Hypertension  Yes    Intervention  Provide education on lifestyle modifcations including regular physical activity/exercise, weight management, moderate sodium restriction and increased consumption of fresh fruit, vegetables, and low fat dairy, alcohol moderation, and smoking cessation.;Monitor prescription use compliance.    Expected Outcomes  Short Term: Continued assessment and intervention until BP is < 140/52mm HG in hypertensive participants. < 130/8mm HG in  hypertensive participants with diabetes, heart failure or chronic kidney disease.;Long Term: Maintenance of blood pressure at goal levels.    Lipids  Yes    Intervention  Provide education and support for participant on nutrition & aerobic/resistive exercise along with prescribed medications to achieve LDL 70mg , HDL >40mg .    Expected Outcomes  Short Term: Participant states understanding of desired cholesterol values and is compliant with medications prescribed. Participant is following exercise prescription and nutrition guidelines.;Long Term: Cholesterol controlled with medications as prescribed, with individualized exercise RX and with personalized nutrition plan. Value goals: LDL < 70mg , HDL > 40 mg.       Core Components/Risk Factors/Patient Goals Review:  Goals and Risk Factor Review    Row Name 03/01/18 1406             Core Components/Risk Factors/Patient Goals Review   Personal Goals Review  Weight Management/Obesity;Heart Failure;Hypertension;Lipids       Review  Jahvon's recent post exercise blood pressures have been low at cardiac rehab. Daymien has been doing well with exercise and has been asymptomatic.  Expected Outcomes  Will continue to monitor vital signs at cardiac rehab. Blayton will continue to take his medications as prescribed.          Core Components/Risk Factors/Patient Goals at Discharge (Final Review):  Goals and Risk Factor Review - 03/01/18 1406      Core Components/Risk Factors/Patient Goals Review   Personal Goals Review  Weight Management/Obesity;Heart Failure;Hypertension;Lipids    Review  Toretto's recent post exercise blood pressures have been low at cardiac rehab. Kaston has been doing well with exercise and has been asymptomatic.    Expected Outcomes  Will continue to monitor vital signs at cardiac rehab. Tymeir will continue to take his medications as prescribed.       ITP Comments: ITP Comments    Row Name 02/01/18 0853 03/01/18 1404          ITP Comments  Dr. Fransico Him, Medical Director   30 day ITP review. Patient off to a good start to exercise. Will continue to monitor BP         Comments: See ITP comments.Barnet Pall, RN,BSN 03/01/2018 2:20 PM

## 2018-03-02 ENCOUNTER — Encounter (HOSPITAL_COMMUNITY)
Admission: RE | Admit: 2018-03-02 | Discharge: 2018-03-02 | Disposition: A | Discharge status: Home / Self Care | Payer: Medicare HMO | Source: Ambulatory Visit | Attending: Internal Medicine | Admitting: Internal Medicine

## 2018-03-02 DIAGNOSIS — K219 Gastro-esophageal reflux disease without esophagitis: Secondary | ICD-10-CM | POA: Diagnosis not present

## 2018-03-02 DIAGNOSIS — Z8673 Personal history of transient ischemic attack (TIA), and cerebral infarction without residual deficits: Secondary | ICD-10-CM | POA: Diagnosis not present

## 2018-03-02 DIAGNOSIS — J302 Other seasonal allergic rhinitis: Secondary | ICD-10-CM | POA: Diagnosis not present

## 2018-03-02 DIAGNOSIS — Z953 Presence of xenogenic heart valve: Secondary | ICD-10-CM | POA: Diagnosis not present

## 2018-03-02 DIAGNOSIS — Z952 Presence of prosthetic heart valve: Secondary | ICD-10-CM

## 2018-03-02 DIAGNOSIS — Z9889 Other specified postprocedural states: Secondary | ICD-10-CM | POA: Diagnosis not present

## 2018-03-02 DIAGNOSIS — Z951 Presence of aortocoronary bypass graft: Secondary | ICD-10-CM | POA: Diagnosis not present

## 2018-03-02 DIAGNOSIS — I509 Heart failure, unspecified: Secondary | ICD-10-CM | POA: Diagnosis not present

## 2018-03-02 DIAGNOSIS — J449 Chronic obstructive pulmonary disease, unspecified: Secondary | ICD-10-CM | POA: Diagnosis not present

## 2018-03-02 DIAGNOSIS — I739 Peripheral vascular disease, unspecified: Secondary | ICD-10-CM | POA: Diagnosis not present

## 2018-03-02 DIAGNOSIS — I5032 Chronic diastolic (congestive) heart failure: Secondary | ICD-10-CM | POA: Diagnosis not present

## 2018-03-02 DIAGNOSIS — F5101 Primary insomnia: Secondary | ICD-10-CM | POA: Diagnosis not present

## 2018-03-02 DIAGNOSIS — Z79891 Long term (current) use of opiate analgesic: Secondary | ICD-10-CM | POA: Diagnosis not present

## 2018-03-02 DIAGNOSIS — Z87891 Personal history of nicotine dependence: Secondary | ICD-10-CM | POA: Diagnosis not present

## 2018-03-02 DIAGNOSIS — Z79899 Other long term (current) drug therapy: Secondary | ICD-10-CM | POA: Diagnosis not present

## 2018-03-02 MED FILL — IPRATROPIUM 0.03% SPRAY: 0.03 | 43 days supply | Qty: 30 | Fill #0

## 2018-03-05 ENCOUNTER — Encounter (HOSPITAL_COMMUNITY)
Admission: RE | Admit: 2018-03-05 | Discharge: 2018-03-05 | Disposition: A | Discharge status: Home / Self Care | Payer: Medicare HMO | Source: Ambulatory Visit | Attending: Internal Medicine | Admitting: Internal Medicine

## 2018-03-05 DIAGNOSIS — J449 Chronic obstructive pulmonary disease, unspecified: Secondary | ICD-10-CM | POA: Diagnosis not present

## 2018-03-05 DIAGNOSIS — Z952 Presence of prosthetic heart valve: Secondary | ICD-10-CM

## 2018-03-05 DIAGNOSIS — Z79899 Other long term (current) drug therapy: Secondary | ICD-10-CM | POA: Diagnosis not present

## 2018-03-05 DIAGNOSIS — Z9889 Other specified postprocedural states: Secondary | ICD-10-CM | POA: Diagnosis not present

## 2018-03-05 DIAGNOSIS — Z87891 Personal history of nicotine dependence: Secondary | ICD-10-CM | POA: Diagnosis not present

## 2018-03-05 DIAGNOSIS — Z953 Presence of xenogenic heart valve: Secondary | ICD-10-CM | POA: Diagnosis not present

## 2018-03-05 DIAGNOSIS — Z79891 Long term (current) use of opiate analgesic: Secondary | ICD-10-CM | POA: Diagnosis not present

## 2018-03-05 DIAGNOSIS — Z950 Presence of cardiac pacemaker: Secondary | ICD-10-CM

## 2018-03-05 DIAGNOSIS — K219 Gastro-esophageal reflux disease without esophagitis: Secondary | ICD-10-CM | POA: Diagnosis not present

## 2018-03-05 DIAGNOSIS — Z951 Presence of aortocoronary bypass graft: Secondary | ICD-10-CM

## 2018-03-05 DIAGNOSIS — Z8673 Personal history of transient ischemic attack (TIA), and cerebral infarction without residual deficits: Secondary | ICD-10-CM | POA: Diagnosis not present

## 2018-03-05 NOTE — Progress Notes (Signed)
Post exercise blood pressure 101/67 sitting. Standing blood pressure 93/65. The heart failure clinic was called and notified. Patient asymptomatic. Kain did not have any complaints during exercise today. Will continue to monitor BP.Barnet Pall, RN,BSN 03/05/2018 12:39 PM

## 2018-03-07 ENCOUNTER — Ambulatory Visit (HOSPITAL_COMMUNITY)
Admission: RE | Admit: 2018-03-07 | Discharge: 2018-03-07 | Disposition: A | Discharge status: Home / Self Care | Payer: Medicare HMO | Source: Ambulatory Visit | Attending: Cardiology | Admitting: Cardiology

## 2018-03-07 ENCOUNTER — Encounter (HOSPITAL_COMMUNITY)
Admission: RE | Admit: 2018-03-07 | Discharge: 2018-03-07 | Disposition: A | Discharge status: Home / Self Care | Payer: Medicare HMO | Source: Ambulatory Visit | Attending: Internal Medicine | Admitting: Internal Medicine

## 2018-03-07 DIAGNOSIS — D509 Iron deficiency anemia, unspecified: Secondary | ICD-10-CM | POA: Diagnosis not present

## 2018-03-07 DIAGNOSIS — Z79891 Long term (current) use of opiate analgesic: Secondary | ICD-10-CM | POA: Diagnosis not present

## 2018-03-07 DIAGNOSIS — Z952 Presence of prosthetic heart valve: Secondary | ICD-10-CM

## 2018-03-07 DIAGNOSIS — Z8673 Personal history of transient ischemic attack (TIA), and cerebral infarction without residual deficits: Secondary | ICD-10-CM | POA: Diagnosis not present

## 2018-03-07 DIAGNOSIS — Z950 Presence of cardiac pacemaker: Secondary | ICD-10-CM

## 2018-03-07 DIAGNOSIS — Z79899 Other long term (current) drug therapy: Secondary | ICD-10-CM | POA: Diagnosis not present

## 2018-03-07 DIAGNOSIS — K219 Gastro-esophageal reflux disease without esophagitis: Secondary | ICD-10-CM | POA: Diagnosis not present

## 2018-03-07 DIAGNOSIS — Z9889 Other specified postprocedural states: Secondary | ICD-10-CM | POA: Diagnosis not present

## 2018-03-07 DIAGNOSIS — J449 Chronic obstructive pulmonary disease, unspecified: Secondary | ICD-10-CM | POA: Diagnosis not present

## 2018-03-07 DIAGNOSIS — Z953 Presence of xenogenic heart valve: Secondary | ICD-10-CM | POA: Diagnosis not present

## 2018-03-07 DIAGNOSIS — Z951 Presence of aortocoronary bypass graft: Secondary | ICD-10-CM | POA: Diagnosis not present

## 2018-03-07 DIAGNOSIS — Z87891 Personal history of nicotine dependence: Secondary | ICD-10-CM | POA: Diagnosis not present

## 2018-03-07 MED ORDER — SODIUM CHLORIDE 0.9 % IV SOLN
510.0000 mg | INTRAVENOUS | Status: DC
  Administered 2018-03-07: 13:00:00 510 mg via INTRAVENOUS
  Filled 2018-03-07: qty 17

## 2018-03-09 ENCOUNTER — Encounter (HOSPITAL_COMMUNITY)
Admission: RE | Admit: 2018-03-09 | Discharge: 2018-03-09 | Disposition: A | Discharge status: Home / Self Care | Payer: Medicare HMO | Source: Ambulatory Visit | Attending: Internal Medicine | Admitting: Internal Medicine

## 2018-03-09 ENCOUNTER — Telehealth (HOSPITAL_COMMUNITY): Payer: Self-pay | Admitting: *Deleted

## 2018-03-09 DIAGNOSIS — Z950 Presence of cardiac pacemaker: Secondary | ICD-10-CM

## 2018-03-09 DIAGNOSIS — Z87891 Personal history of nicotine dependence: Secondary | ICD-10-CM | POA: Diagnosis not present

## 2018-03-09 DIAGNOSIS — Z79899 Other long term (current) drug therapy: Secondary | ICD-10-CM | POA: Diagnosis not present

## 2018-03-09 DIAGNOSIS — Z8673 Personal history of transient ischemic attack (TIA), and cerebral infarction without residual deficits: Secondary | ICD-10-CM | POA: Diagnosis not present

## 2018-03-09 DIAGNOSIS — Z953 Presence of xenogenic heart valve: Secondary | ICD-10-CM | POA: Diagnosis not present

## 2018-03-09 DIAGNOSIS — K219 Gastro-esophageal reflux disease without esophagitis: Secondary | ICD-10-CM | POA: Diagnosis not present

## 2018-03-09 DIAGNOSIS — J449 Chronic obstructive pulmonary disease, unspecified: Secondary | ICD-10-CM | POA: Diagnosis not present

## 2018-03-09 DIAGNOSIS — Z951 Presence of aortocoronary bypass graft: Secondary | ICD-10-CM | POA: Diagnosis not present

## 2018-03-09 DIAGNOSIS — Z952 Presence of prosthetic heart valve: Secondary | ICD-10-CM

## 2018-03-09 DIAGNOSIS — Z9889 Other specified postprocedural states: Secondary | ICD-10-CM | POA: Diagnosis not present

## 2018-03-09 DIAGNOSIS — Z79891 Long term (current) use of opiate analgesic: Secondary | ICD-10-CM | POA: Diagnosis not present

## 2018-03-09 MED ORDER — FUROSEMIDE 20 MG PO TABS: 40.0000 mg | ORAL_TABLET | Freq: Every day | ORAL | 3 refills | Status: DC

## 2018-03-09 NOTE — Telephone Encounter (Signed)
Called patient regarding increased lasix. Left detailed VM stating to increase lasix to 40 mg daily (2 Tablets).  Left number for her to call back with any questions.  MAR updated.

## 2018-03-09 NOTE — Progress Notes (Signed)
Brandon [name]'s weight is up 3.6 kg from Wednesday. Brandon [name] denies shortness of breath having any swelling. Upon assessment lung fields essentially clear. Brandon [name] admitted that he had some lays potato chips yesterday. Susie RN at the heart failure clinic called and notified. Patient exercised without complaints. Vital signs stable.Will continue to monitor the patient throughout  the program.

## 2018-03-09 NOTE — Telephone Encounter (Signed)
Increase Lasix back to his prior dose please.

## 2018-03-09 NOTE — Telephone Encounter (Signed)
Advanced Heart Failure Triage Encounter  Patient Name: Brandon [name]  Date of Call: 03/09/18  Problem:  Verdis Frederickson called from [facility] to report patient has had a 8 lb (3.6 kg) wt gain since Wednesday.  Patient is asymptomatic.  Lasix was decreased on 3/19 due to hypotension.  Patients BP today before rehab was 148/85 and 110/70 at the end of rehab.  Plan:  Will forward to Dr. Aundra Dubin to review if patient needs to increase lasix or not.   Darron Doom, RN

## 2018-03-12 ENCOUNTER — Encounter (HOSPITAL_COMMUNITY)
Admission: RE | Admit: 2018-03-12 | Discharge: 2018-03-12 | Disposition: A | Discharge status: Home / Self Care | Payer: Medicare HMO | Source: Ambulatory Visit | Attending: Internal Medicine | Admitting: Internal Medicine

## 2018-03-12 ENCOUNTER — Ambulatory Visit (INDEPENDENT_AMBULATORY_CARE_PROVIDER_SITE_OTHER): Payer: Medicare HMO | Admitting: Pharmacist

## 2018-03-12 DIAGNOSIS — Z953 Presence of xenogenic heart valve: Secondary | ICD-10-CM | POA: Insufficient documentation

## 2018-03-12 DIAGNOSIS — Z79899 Other long term (current) drug therapy: Secondary | ICD-10-CM | POA: Insufficient documentation

## 2018-03-12 DIAGNOSIS — Z951 Presence of aortocoronary bypass graft: Secondary | ICD-10-CM | POA: Diagnosis not present

## 2018-03-12 DIAGNOSIS — Z8679 Personal history of other diseases of the circulatory system: Secondary | ICD-10-CM

## 2018-03-12 DIAGNOSIS — Z79891 Long term (current) use of opiate analgesic: Secondary | ICD-10-CM | POA: Diagnosis not present

## 2018-03-12 DIAGNOSIS — Z5181 Encounter for therapeutic drug level monitoring: Secondary | ICD-10-CM

## 2018-03-12 DIAGNOSIS — Z950 Presence of cardiac pacemaker: Secondary | ICD-10-CM

## 2018-03-12 DIAGNOSIS — I480 Paroxysmal atrial fibrillation: Secondary | ICD-10-CM

## 2018-03-12 DIAGNOSIS — Z952 Presence of prosthetic heart valve: Secondary | ICD-10-CM

## 2018-03-12 DIAGNOSIS — Z9889 Other specified postprocedural states: Secondary | ICD-10-CM | POA: Diagnosis not present

## 2018-03-12 DIAGNOSIS — Z87891 Personal history of nicotine dependence: Secondary | ICD-10-CM | POA: Insufficient documentation

## 2018-03-12 DIAGNOSIS — Z8673 Personal history of transient ischemic attack (TIA), and cerebral infarction without residual deficits: Secondary | ICD-10-CM | POA: Diagnosis not present

## 2018-03-12 DIAGNOSIS — K219 Gastro-esophageal reflux disease without esophagitis: Secondary | ICD-10-CM | POA: Insufficient documentation

## 2018-03-12 DIAGNOSIS — J449 Chronic obstructive pulmonary disease, unspecified: Secondary | ICD-10-CM | POA: Diagnosis not present

## 2018-03-12 LAB — POCT INR: INR: 2

## 2018-03-12 NOTE — Patient Instructions (Signed)
Description   Continue taking 1 tablet daily except 1.5 tablets on Mondays.  Recheck INR in 4 weeks. Call with any new medications scheduled procedures or any questions  [phone]

## 2018-03-14 ENCOUNTER — Encounter (HOSPITAL_COMMUNITY)
Admission: RE | Admit: 2018-03-14 | Discharge: 2018-03-14 | Disposition: A | Discharge status: Home / Self Care | Payer: Medicare HMO | Source: Ambulatory Visit | Attending: Internal Medicine | Admitting: Internal Medicine

## 2018-03-14 DIAGNOSIS — Z952 Presence of prosthetic heart valve: Secondary | ICD-10-CM

## 2018-03-14 DIAGNOSIS — J449 Chronic obstructive pulmonary disease, unspecified: Secondary | ICD-10-CM | POA: Diagnosis not present

## 2018-03-14 DIAGNOSIS — Z9889 Other specified postprocedural states: Secondary | ICD-10-CM | POA: Diagnosis not present

## 2018-03-14 DIAGNOSIS — Z953 Presence of xenogenic heart valve: Secondary | ICD-10-CM | POA: Diagnosis not present

## 2018-03-14 DIAGNOSIS — Z79891 Long term (current) use of opiate analgesic: Secondary | ICD-10-CM | POA: Diagnosis not present

## 2018-03-14 DIAGNOSIS — Z8673 Personal history of transient ischemic attack (TIA), and cerebral infarction without residual deficits: Secondary | ICD-10-CM | POA: Diagnosis not present

## 2018-03-14 DIAGNOSIS — Z79899 Other long term (current) drug therapy: Secondary | ICD-10-CM | POA: Diagnosis not present

## 2018-03-14 DIAGNOSIS — Z87891 Personal history of nicotine dependence: Secondary | ICD-10-CM | POA: Diagnosis not present

## 2018-03-14 DIAGNOSIS — K219 Gastro-esophageal reflux disease without esophagitis: Secondary | ICD-10-CM | POA: Diagnosis not present

## 2018-03-14 DIAGNOSIS — Z951 Presence of aortocoronary bypass graft: Secondary | ICD-10-CM | POA: Diagnosis not present

## 2018-03-15 ENCOUNTER — Encounter: Payer: Self-pay | Admitting: Internal Medicine

## 2018-03-15 ENCOUNTER — Ambulatory Visit (HOSPITAL_COMMUNITY)
Admission: RE | Admit: 2018-03-15 | Discharge: 2018-03-15 | Disposition: A | Discharge status: Home / Self Care | Payer: Medicare HMO | Source: Ambulatory Visit | Attending: Cardiology | Admitting: Cardiology

## 2018-03-15 VITALS — BP 115/62 | HR 67 | Wt 247.0 lb

## 2018-03-15 DIAGNOSIS — Z87891 Personal history of nicotine dependence: Secondary | ICD-10-CM | POA: Insufficient documentation

## 2018-03-15 DIAGNOSIS — Z953 Presence of xenogenic heart valve: Secondary | ICD-10-CM

## 2018-03-15 DIAGNOSIS — I2510 Atherosclerotic heart disease of native coronary artery without angina pectoris: Secondary | ICD-10-CM | POA: Diagnosis not present

## 2018-03-15 DIAGNOSIS — Z8679 Personal history of other diseases of the circulatory system: Secondary | ICD-10-CM | POA: Diagnosis not present

## 2018-03-15 DIAGNOSIS — J449 Chronic obstructive pulmonary disease, unspecified: Secondary | ICD-10-CM | POA: Insufficient documentation

## 2018-03-15 DIAGNOSIS — I5042 Chronic combined systolic (congestive) and diastolic (congestive) heart failure: Secondary | ICD-10-CM | POA: Insufficient documentation

## 2018-03-15 DIAGNOSIS — K219 Gastro-esophageal reflux disease without esophagitis: Secondary | ICD-10-CM | POA: Diagnosis not present

## 2018-03-15 DIAGNOSIS — H9190 Unspecified hearing loss, unspecified ear: Secondary | ICD-10-CM | POA: Diagnosis not present

## 2018-03-15 DIAGNOSIS — D509 Iron deficiency anemia, unspecified: Secondary | ICD-10-CM | POA: Diagnosis not present

## 2018-03-15 DIAGNOSIS — Z79899 Other long term (current) drug therapy: Secondary | ICD-10-CM | POA: Diagnosis not present

## 2018-03-15 DIAGNOSIS — I442 Atrioventricular block, complete: Secondary | ICD-10-CM | POA: Diagnosis not present

## 2018-03-15 DIAGNOSIS — Z951 Presence of aortocoronary bypass graft: Secondary | ICD-10-CM | POA: Diagnosis not present

## 2018-03-15 DIAGNOSIS — M109 Gout, unspecified: Secondary | ICD-10-CM | POA: Insufficient documentation

## 2018-03-15 DIAGNOSIS — Z8673 Personal history of transient ischemic attack (TIA), and cerebral infarction without residual deficits: Secondary | ICD-10-CM | POA: Insufficient documentation

## 2018-03-15 DIAGNOSIS — I130 Hypertensive heart and chronic kidney disease with heart failure and stage 1 through stage 4 chronic kidney disease, or unspecified chronic kidney disease: Secondary | ICD-10-CM | POA: Diagnosis not present

## 2018-03-15 DIAGNOSIS — I080 Rheumatic disorders of both mitral and aortic valves: Secondary | ICD-10-CM | POA: Insufficient documentation

## 2018-03-15 DIAGNOSIS — I5032 Chronic diastolic (congestive) heart failure: Secondary | ICD-10-CM | POA: Diagnosis not present

## 2018-03-15 DIAGNOSIS — E785 Hyperlipidemia, unspecified: Secondary | ICD-10-CM | POA: Insufficient documentation

## 2018-03-15 DIAGNOSIS — Z8249 Family history of ischemic heart disease and other diseases of the circulatory system: Secondary | ICD-10-CM | POA: Diagnosis not present

## 2018-03-15 DIAGNOSIS — I480 Paroxysmal atrial fibrillation: Secondary | ICD-10-CM | POA: Diagnosis not present

## 2018-03-15 DIAGNOSIS — Z79891 Long term (current) use of opiate analgesic: Secondary | ICD-10-CM | POA: Insufficient documentation

## 2018-03-15 DIAGNOSIS — Z7901 Long term (current) use of anticoagulants: Secondary | ICD-10-CM | POA: Insufficient documentation

## 2018-03-15 DIAGNOSIS — Z9889 Other specified postprocedural states: Secondary | ICD-10-CM | POA: Diagnosis not present

## 2018-03-15 DIAGNOSIS — N183 Chronic kidney disease, stage 3 (moderate): Secondary | ICD-10-CM | POA: Diagnosis not present

## 2018-03-15 DIAGNOSIS — Z823 Family history of stroke: Secondary | ICD-10-CM | POA: Diagnosis not present

## 2018-03-15 DIAGNOSIS — I5022 Chronic systolic (congestive) heart failure: Secondary | ICD-10-CM

## 2018-03-15 LAB — CBC
HCT: 37 % — ABNORMAL LOW (ref 39.0–52.0)
Hemoglobin: 11.8 g/dL — ABNORMAL LOW (ref 13.0–17.0)
MCH: 30.5 pg (ref 26.0–34.0)
MCHC: 31.9 g/dL (ref 30.0–36.0)
MCV: 95.6 fL (ref 78.0–100.0)
Platelets: 189 K/uL (ref 150–400)
RBC: 3.87 MIL/uL — ABNORMAL LOW (ref 4.22–5.81)
RDW: 15.2 % (ref 11.5–15.5)
WBC: 4.9 K/uL (ref 4.0–10.5)

## 2018-03-15 LAB — BASIC METABOLIC PANEL WITH GFR
Anion gap: 9 (ref 5–15)
BUN: 23 mg/dL — ABNORMAL HIGH (ref 6–20)
CO2: 21 mmol/L — ABNORMAL LOW (ref 22–32)
Calcium: 8.8 mg/dL — ABNORMAL LOW (ref 8.9–10.3)
Chloride: 108 mmol/L (ref 101–111)
Creatinine, Ser: 1.56 mg/dL — ABNORMAL HIGH (ref 0.61–1.24)
GFR calc Af Amer: 49 mL/min — ABNORMAL LOW
GFR calc non Af Amer: 43 mL/min — ABNORMAL LOW
Glucose, Bld: 117 mg/dL — ABNORMAL HIGH (ref 65–99)
Potassium: 4.1 mmol/L (ref 3.5–5.1)
Sodium: 138 mmol/L (ref 135–145)

## 2018-03-15 LAB — LIPID PANEL
Cholesterol: 141 mg/dL (ref 0–200)
HDL: 43 mg/dL
LDL Cholesterol: 75 mg/dL (ref 0–99)
Total CHOL/HDL Ratio: 3.3 ratio
Triglycerides: 117 mg/dL
VLDL: 23 mg/dL (ref 0–40)

## 2018-03-15 MED ORDER — FUROSEMIDE 40 MG PO TABS: ORAL_TABLET | ORAL | 3 refills | Status: DC

## 2018-03-15 NOTE — Patient Instructions (Signed)
Increase Furosemide 60 mg (1.5 tabs), alternating with 40 mg (1 tab) every other day   Labs drawn today (if we do not call you, then your lab work was stable)   Your physician recommends that you return for lab work in: 2 weeks   Your physician recommends that you schedule a follow-up appointment in: 6 weeks with Dr. Aundra Dubin

## 2018-03-15 NOTE — Progress Notes (Signed)
Advanced Heart Failure Clinic Note   PCP: Dr. Justin Mend Cardiology: Dr. Radford Pax HF Cardiology: Dr. Aundra Dubin  72 y.o.with history of CVA, paroxysmal atrial fibrillation, valvular heart disease, and chronic diastolic CHF presents for evaluation of CHF and valvular heart disease.    He had had episodes of dyspnea and initial workup led to a TEE in 5/18 showing normal EF with moderate AS and moderate MR.  He continued to have episodic dyspnea and ended up admitted in 8/18 with shortness of breath and chest pressure.  This led to a long, complicated hospitalization.  He was noted to be volume overloaded with acute diastolic CHF and was also noted to have symptomatic runs of atrial fibrillation with RVR.  Amiodarone was started to control the atrial fibrillation.  He developed respiratory distress => Bipap => intubated.  He became hypotensive, requiring pressors.  He developed AKI.  PNA was noted and he received broad spectrum abx.  He had a prolonged intubation but was eventually extubated.  Given deconditioning from long hospitalization, he went to CIR for a couple of weeks.  LHC in 9/18 showed occluded PDA with left>right collaterals.  TEE in 9/18 showed EF 50%, severe MR (possibly infarct-related with restricted posterior leaflet, and moderate AS + moderate-severe AI (possibly rheumatic).   In 12/18, he had cardiac surgery with bioprosthetic aortic and mitral valves placed.  He had SVG-PDA, Maze, and LA appendage clipping.  Post-op course was complicated by CHB requiring Medtronic dual chamber PPM (His bundle lead placed but captured right bundle so induced LBBB).   Echo 01/02/18 LVEF 30-35%, bioprosthetic MV and AoV function normally, RV mildly dilated with normal systolic function.    He returns today for followup of CHF and valvular disease.  Pacemaker recently adjusted to decrease RV pacing.  We interrogated the PPM today, only 0.9% V-pacing now.  He is doing cardiac rehab.  BP runs low at times at cardiac  rehab. Weight is up, he is eating better but also thinks that he has gained some fluid.  No dyspnea walking on flat ground.  OK with 1 flight steps. No orthopnea/PND.  He is short of breath with heavier exertion such as carrying grocery bags.  No chest pain.   Labs (9/18): K 4.5, creatinine 1.42, LDL 90, HDL 36, LFTs normal, TSH normal, hgb 9.6 Labs (10/18): K 4.3, creatinine 1.45 Labs (12/18): K 3.8, creatinine 1.56 Labs (1/19): creatinine 1.68, LDL 66 Labs (3/19): K 4.2, creatinine 1.56  ECG (personally reviewed): a-paced, poor RWP  PMH: 1. CVA: Right MCA, 2004.  Minimal residual problems.  2. HTN 3. Hyperlipidemia 4. Left rotator cuff surgery 6/18 5. Carotid stenosis: s/p right carotid stent.  6. GERD 7. H/o CCY 8. Anemia: FOBT negative.  9. Gout  10. Atrial fibrillation: Paroxysmal. Maze in 12/18 with LAA clipping.  11. Valvular heart disease: TEE (5/18) with EF 55-60%, moderate AS mean 33 mmHg, moderate MR, normal RV size and systolic function.  - Echo (8/18): EF 55-60%, moderate LVH, moderate AS with mean gradient 33 mmHg, moderate AI, moderate to severe MR.  - TEE (9/18): EF 50%, mild LV dilation, suspect severe MR with posterior leaflet restricted (?infarct-related MR), ERO 0.4 cm^2, moderate AS/moderate-severe AI (possibly rheumatic).   - In 12/18, he had cardiac surgery with bioprosthetic aortic and mitral valves placed.  He had SVG-PDA, Maze, and LA appendage clipping. - Echo (1/19): LVEF 30-35%, bioprosthetic MV and AoV function normally, RV mildly dilated with normal systolic function.   12. Chronic diastolic  CHF 13. Deafness 14. CKD: stage 3.  15. 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  17. COPD: Mild obstruction on 9/18 PFTs.  18. Post-op CHB in 12/18 with Medtronic dual chamber PPM.  19. Chronic systolic CHF: Echo (8/25) with  EF down to 30-35% post-op.   SH: Quit smoking 8/18.  Married, 2 children, retired.    Family History  Problem Relation Age of Onset  . Stroke Father        No details  . Angina Mother    Review of systems complete and found to be negative unless listed in HPI.    Current Outpatient Medications  Medication Sig Dispense Refill  . acetaminophen (TYLENOL) 325 MG tablet Take 1 tablet (325 mg total) by mouth every 6 (six) hours as needed for mild pain. 30 tablet 0  . calcium carbonate (TUMS - DOSED IN MG ELEMENTAL CALCIUM) 500 MG chewable tablet Chew 1 tablet by mouth daily as needed for indigestion or heartburn.    . carvedilol (COREG) 6.25 MG tablet Take 1 tablet (6.25 mg total) by mouth 2 (two) times daily. 180 tablet 3  . cetirizine (ZYRTEC) 10 MG tablet Take 10 mg by mouth daily as needed for allergies.    . citalopram (CELEXA) 20 MG tablet Take 1 tablet (20 mg total) by mouth at bedtime. 30 tablet 5  . docusate sodium (COLACE) 100 MG capsule Take 1 capsule (100 mg total) by mouth 3 (three) times daily as needed. 20 capsule 0  . furosemide (LASIX) 40 MG tablet Take 60 mg (1.5 tabs), alternating with 40 mg (1 tab) every other day 75 tablet 3  . ipratropium (ATROVENT) 0.03 % nasal spray Place 2 sprays into both nostrils every 12 (twelve) hours.    [name] LORazepam (ATIVAN) 0.5 MG tablet Take 1 tablet (0.5 mg total) by mouth every 6 (six) hours as needed for anxiety (aggitation). 20 tablet 0  . lovastatin (MEVACOR) 40 MG tablet TAKE 1 TABLET AT BEDTIME  (NEW  DOSE) 90 tablet 3  . ondansetron (ZOFRAN) 4 MG tablet Take 4 mg by mouth every 8 (eight) hours as needed for nausea or vomiting.    [name] oxyCODONE (OXY IR/ROXICODONE) 5 MG immediate release tablet Take 1 tablet (5 mg total) by mouth every 4 (four) hours as needed for severe pain. 30 tablet 0  . pantoprazole (PROTONIX) 40 MG tablet Take 1 tablet (40 mg total) by mouth daily. 30 tablet 1  . sacubitril-valsartan (ENTRESTO) 24-26 MG Take 1 tablet by  mouth 2 (two) times daily. 180 tablet 3  . traZODone (DESYREL) 50 MG tablet Take 50 mg by mouth at bedtime.     [name] warfarin (COUMADIN) 5 MG tablet Take 1 tablet (5 mg total) by mouth daily at 6 PM. 40 tablet 3   No current facility-administered medications for this encounter.    Vitals:   03/15/18 1210  BP: 115/62  Pulse: 67  SpO2: 96%  Weight: 247 lb (112 kg)   Wt Readings from Last 3 Encounters:  03/15/18 247 lb (112 kg)  03/07/18 240 lb (108.9 kg)  02/28/18 241 lb (109.3 kg)   General: NAD Neck: JVP 8 cm with HJR, no thyromegaly or thyroid nodule.  Lungs: Clear to auscultation bilaterally with normal respiratory effort. CV: Nondisplaced PMI.  Heart regular S1/S2, no S3/S4, 2/6 SEM RUSB with clear S2.  No peripheral edema.  No carotid bruit.  Normal pedal pulses.  Abdomen: Soft, nontender, no hepatosplenomegaly, no distention.  Skin: Intact without lesions or rashes.  Neurologic: Alert and oriented x 3.  Psych: Normal affect. Extremities: No clubbing or cyanosis.  HEENT: Normal.   Assessment/Plan: 1. Valvular heart disease: TEE in 9/18 showed severe MR, probably infarct-related with restrictive posterior mitral leaflet. There was moderate aortic stenosis and moderate-severe aortic insufficiency, possible rheumatic.  In 12/18, he had bioprosthetic MVR and AVR.  - Valves stable on 1/19 echo.  - Continue cardiac rehab.  2. Chronic combined CHF: In setting of significant valvular disease as above. EF down to 30-35% post-op (1/19 echo), likely reflects true LV systolic function without the volume load from severe MR and moderate-severe AI.  Mild volume overload on exam with weight up.  BP has been soft at cardiac rehab.  NYHA class II symptoms.  - Increase Lasix to 60 mg daily alternating with 40 mg daily.  BMET today and 10 days.   - Continue Coreg 6.25 mg BID - Continue Entresto 24/26 bid.  - With soft BP, will not start spironolactone 12.5 mg daily yet, aim for next appt. -  Recent concern for frequent RV pacing.  PPM adjusted, and today, only 0.9% V-pacing.  3. CKD: Stage 3. BMET today.  4. Atrial fibrillation: Paroxysmal.  He tends to tolerate afib poorly.  He is now maintaining NSR after Maze in 12/18.  He is no longer on amiodarone.  - He is on warfarin.  5. COPD: Mild obstruction on PFTs.  He has quit smoking.  Stable. No change.   6. CVA: Remote, minimal residual. S/p carotid stenting.  - No ASA given warfarin use.  7. Hyperlipidemia: Check lipids today.   8. Anemia: Fe deficiency.  FOBT negative 8/18.  Got feraheme in 8/18.   - CBC 9. 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  The artery to the PDA was occluded with left to right collaterals.  He had SVG-PDA in 12/18. No chest pain.  - Continue statin.   - No ASA given warfarin use.  10. CHB: has Medtronic PPM.  Minimal RV pacing now.   Followup in 6 wks.   Loralie Champagne 03/15/2018

## 2018-03-16 ENCOUNTER — Encounter (HOSPITAL_COMMUNITY): Payer: Self-pay

## 2018-03-16 ENCOUNTER — Encounter (HOSPITAL_COMMUNITY)
Admission: RE | Admit: 2018-03-16 | Discharge: 2018-03-16 | Disposition: A | Discharge status: Home / Self Care | Payer: Medicare HMO | Source: Ambulatory Visit | Attending: Internal Medicine | Admitting: Internal Medicine

## 2018-03-16 DIAGNOSIS — Z87891 Personal history of nicotine dependence: Secondary | ICD-10-CM | POA: Diagnosis not present

## 2018-03-16 DIAGNOSIS — J449 Chronic obstructive pulmonary disease, unspecified: Secondary | ICD-10-CM | POA: Diagnosis not present

## 2018-03-16 DIAGNOSIS — Z9889 Other specified postprocedural states: Secondary | ICD-10-CM | POA: Diagnosis not present

## 2018-03-16 DIAGNOSIS — K219 Gastro-esophageal reflux disease without esophagitis: Secondary | ICD-10-CM | POA: Diagnosis not present

## 2018-03-16 DIAGNOSIS — Z79899 Other long term (current) drug therapy: Secondary | ICD-10-CM | POA: Diagnosis not present

## 2018-03-16 DIAGNOSIS — Z953 Presence of xenogenic heart valve: Secondary | ICD-10-CM | POA: Diagnosis not present

## 2018-03-16 DIAGNOSIS — Z79891 Long term (current) use of opiate analgesic: Secondary | ICD-10-CM | POA: Diagnosis not present

## 2018-03-16 DIAGNOSIS — Z8673 Personal history of transient ischemic attack (TIA), and cerebral infarction without residual deficits: Secondary | ICD-10-CM | POA: Diagnosis not present

## 2018-03-16 DIAGNOSIS — Z952 Presence of prosthetic heart valve: Secondary | ICD-10-CM

## 2018-03-16 DIAGNOSIS — Z951 Presence of aortocoronary bypass graft: Secondary | ICD-10-CM

## 2018-03-19 ENCOUNTER — Telehealth (HOSPITAL_COMMUNITY): Payer: Self-pay | Admitting: *Deleted

## 2018-03-19 ENCOUNTER — Encounter (HOSPITAL_COMMUNITY)
Admission: RE | Admit: 2018-03-19 | Discharge: 2018-03-19 | Disposition: A | Discharge status: Home / Self Care | Payer: Medicare HMO | Source: Ambulatory Visit | Attending: Internal Medicine | Admitting: Internal Medicine

## 2018-03-19 ENCOUNTER — Other Ambulatory Visit (HOSPITAL_COMMUNITY): Payer: Self-pay

## 2018-03-19 DIAGNOSIS — Z87891 Personal history of nicotine dependence: Secondary | ICD-10-CM | POA: Diagnosis not present

## 2018-03-19 DIAGNOSIS — Z953 Presence of xenogenic heart valve: Secondary | ICD-10-CM | POA: Diagnosis not present

## 2018-03-19 DIAGNOSIS — J449 Chronic obstructive pulmonary disease, unspecified: Secondary | ICD-10-CM | POA: Diagnosis not present

## 2018-03-19 DIAGNOSIS — Z950 Presence of cardiac pacemaker: Secondary | ICD-10-CM

## 2018-03-19 DIAGNOSIS — Z9889 Other specified postprocedural states: Secondary | ICD-10-CM | POA: Diagnosis not present

## 2018-03-19 DIAGNOSIS — K219 Gastro-esophageal reflux disease without esophagitis: Secondary | ICD-10-CM | POA: Diagnosis not present

## 2018-03-19 DIAGNOSIS — Z8673 Personal history of transient ischemic attack (TIA), and cerebral infarction without residual deficits: Secondary | ICD-10-CM | POA: Diagnosis not present

## 2018-03-19 DIAGNOSIS — Z951 Presence of aortocoronary bypass graft: Secondary | ICD-10-CM | POA: Diagnosis not present

## 2018-03-19 DIAGNOSIS — Z79891 Long term (current) use of opiate analgesic: Secondary | ICD-10-CM | POA: Diagnosis not present

## 2018-03-19 DIAGNOSIS — Z79899 Other long term (current) drug therapy: Secondary | ICD-10-CM | POA: Diagnosis not present

## 2018-03-19 DIAGNOSIS — Z952 Presence of prosthetic heart valve: Secondary | ICD-10-CM

## 2018-03-19 DIAGNOSIS — D509 Iron deficiency anemia, unspecified: Secondary | ICD-10-CM

## 2018-03-19 NOTE — Telephone Encounter (Signed)
Brandon [name] called to let Dr Aundra Dubin know pt's BP was low at cardiac rehab today.  She states when he arrived it was 90/50 manual, she checkec automatic machine and it was 111, recheck was 98/60, during exercise 118//68 and at exit 92/60, she states pt was asymptomatic, she did give him a bottle of Gatorade in the beginning due to low reading.  She states she called pt's wife and notified her of low readings and wanted Dr Aundra Dubin to be aware as well, again pt was asymptomatic and denied any dizziness or lightheadedness.  Will send to Dr Aundra Dubin for his review

## 2018-03-19 NOTE — Progress Notes (Signed)
Initial blood pressure 114/62 sitting. Standing blood pressure 100/68 with the automatic cuff. Manual blood pressure around 90 systolic. Patient asymptomatic and proceeded to exercise without difficulty. Blood pressure 104/60 on the airdyne then 98/60. Patient remains asymptomatic and was given Gatorade. Will notify the heart failure clinic about today's blood pressures.Barnet Pall, RN,BSN 03/19/2018 11:57 AM

## 2018-03-19 NOTE — Telephone Encounter (Signed)
Fine if SBP 90 or above and not dizzy.

## 2018-03-19 NOTE — Telephone Encounter (Signed)
Spoke w/pt's wife she is aware and agreeable, she again states pt feels ok, no dizziness, if develops she will call our office.  Called and spoke w/Brandon [name] in cardiac rehab and she is also aware of Dr Claris Gladden recommendations

## 2018-03-21 ENCOUNTER — Encounter (HOSPITAL_COMMUNITY)
Admission: RE | Admit: 2018-03-21 | Discharge: 2018-03-21 | Disposition: A | Discharge status: Home / Self Care | Payer: Medicare HMO | Source: Ambulatory Visit | Attending: Internal Medicine | Admitting: Internal Medicine

## 2018-03-21 DIAGNOSIS — Z952 Presence of prosthetic heart valve: Secondary | ICD-10-CM

## 2018-03-21 DIAGNOSIS — Z79891 Long term (current) use of opiate analgesic: Secondary | ICD-10-CM | POA: Diagnosis not present

## 2018-03-21 DIAGNOSIS — J449 Chronic obstructive pulmonary disease, unspecified: Secondary | ICD-10-CM | POA: Diagnosis not present

## 2018-03-21 DIAGNOSIS — K219 Gastro-esophageal reflux disease without esophagitis: Secondary | ICD-10-CM | POA: Diagnosis not present

## 2018-03-21 DIAGNOSIS — Z8673 Personal history of transient ischemic attack (TIA), and cerebral infarction without residual deficits: Secondary | ICD-10-CM | POA: Diagnosis not present

## 2018-03-21 DIAGNOSIS — Z950 Presence of cardiac pacemaker: Secondary | ICD-10-CM

## 2018-03-21 DIAGNOSIS — Z951 Presence of aortocoronary bypass graft: Secondary | ICD-10-CM | POA: Diagnosis not present

## 2018-03-21 DIAGNOSIS — Z79899 Other long term (current) drug therapy: Secondary | ICD-10-CM | POA: Diagnosis not present

## 2018-03-21 DIAGNOSIS — Z9889 Other specified postprocedural states: Secondary | ICD-10-CM | POA: Diagnosis not present

## 2018-03-21 DIAGNOSIS — Z87891 Personal history of nicotine dependence: Secondary | ICD-10-CM | POA: Diagnosis not present

## 2018-03-21 DIAGNOSIS — Z953 Presence of xenogenic heart valve: Secondary | ICD-10-CM | POA: Diagnosis not present

## 2018-03-23 ENCOUNTER — Encounter (HOSPITAL_COMMUNITY)
Admission: RE | Admit: 2018-03-23 | Discharge: 2018-03-23 | Disposition: A | Discharge status: Home / Self Care | Payer: Medicare HMO | Source: Ambulatory Visit | Attending: Internal Medicine | Admitting: Internal Medicine

## 2018-03-23 DIAGNOSIS — Z79899 Other long term (current) drug therapy: Secondary | ICD-10-CM | POA: Diagnosis not present

## 2018-03-23 DIAGNOSIS — Z950 Presence of cardiac pacemaker: Secondary | ICD-10-CM

## 2018-03-23 DIAGNOSIS — Z9889 Other specified postprocedural states: Secondary | ICD-10-CM | POA: Diagnosis not present

## 2018-03-23 DIAGNOSIS — Z951 Presence of aortocoronary bypass graft: Secondary | ICD-10-CM | POA: Diagnosis not present

## 2018-03-23 DIAGNOSIS — Z79891 Long term (current) use of opiate analgesic: Secondary | ICD-10-CM | POA: Diagnosis not present

## 2018-03-23 DIAGNOSIS — K219 Gastro-esophageal reflux disease without esophagitis: Secondary | ICD-10-CM | POA: Diagnosis not present

## 2018-03-23 DIAGNOSIS — Z952 Presence of prosthetic heart valve: Secondary | ICD-10-CM

## 2018-03-23 DIAGNOSIS — Z87891 Personal history of nicotine dependence: Secondary | ICD-10-CM | POA: Diagnosis not present

## 2018-03-23 DIAGNOSIS — Z953 Presence of xenogenic heart valve: Secondary | ICD-10-CM | POA: Diagnosis not present

## 2018-03-23 DIAGNOSIS — Z8673 Personal history of transient ischemic attack (TIA), and cerebral infarction without residual deficits: Secondary | ICD-10-CM | POA: Diagnosis not present

## 2018-03-23 DIAGNOSIS — J449 Chronic obstructive pulmonary disease, unspecified: Secondary | ICD-10-CM | POA: Diagnosis not present

## 2018-03-26 ENCOUNTER — Encounter (HOSPITAL_COMMUNITY): Payer: Medicare HMO

## 2018-03-26 ENCOUNTER — Telehealth (HOSPITAL_COMMUNITY): Payer: Self-pay | Admitting: Family Medicine

## 2018-03-26 MED FILL — WARFARIN SODIUM 5 MG TABLET: 5 | 40 days supply | Qty: 40 | Fill #1

## 2018-03-28 ENCOUNTER — Encounter (HOSPITAL_COMMUNITY): Admission: RE | Admit: 2018-03-28 | Payer: Medicare HMO | Source: Ambulatory Visit

## 2018-03-29 ENCOUNTER — Encounter (HOSPITAL_COMMUNITY): Payer: Self-pay

## 2018-03-29 NOTE — Progress Notes (Signed)
Cardiac Individual Treatment Plan  Patient Details  Name: Brandon [name] MRN: [id number] Date of Birth: 1946-03-07 Referring Provider:     [facility] from 02/01/2018 in [place]  Referring Provider  Wyatt Haste MD and Loralie Champagne MD      Initial Encounter Date:    CARDIAC REHAB PHASE II ORIENTATION from 02/01/2018 in [place]  Date  02/01/18  Referring Provider  Wyatt Haste MD and Loralie Champagne MD      Visit Diagnosis: S/P CABG x 1  S/P MVR (mitral valve replacement)  S/P AVR (aortic valve replacement)  Pacemaker  Patient's Home Medications on Admission:  Current Outpatient Medications:  .  acetaminophen (TYLENOL) 325 MG tablet, Take 1 tablet (325 mg total) by mouth every 6 (six) hours as needed for mild pain., Disp: 30 tablet, Rfl: 0 .  calcium carbonate (TUMS - DOSED IN MG ELEMENTAL CALCIUM) 500 MG chewable tablet, Chew 1 tablet by mouth daily as needed for indigestion or heartburn., Disp: , Rfl:  .  carvedilol (COREG) 6.25 MG tablet, Take 1 tablet (6.25 mg total) by mouth 2 (two) times daily., Disp: 180 tablet, Rfl: 3 .  cetirizine (ZYRTEC) 10 MG tablet, Take 10 mg by mouth daily as needed for allergies., Disp: , Rfl:  .  citalopram (CELEXA) 20 MG tablet, Take 1 tablet (20 mg total) by mouth at bedtime., Disp: 30 tablet, Rfl: 5 .  docusate sodium (COLACE) 100 MG capsule, Take 1 capsule (100 mg total) by mouth 3 (three) times daily as needed., Disp: 20 capsule, Rfl: 0 .  furosemide (LASIX) 40 MG tablet, Take 60 mg (1.5 tabs), alternating with 40 mg (1 tab) every other day, Disp: 75 tablet, Rfl: 3 .  ipratropium (ATROVENT) 0.03 % nasal spray, Place 2 sprays into both nostrils every 12 (twelve) hours., Disp: , Rfl:  .  LORazepam (ATIVAN) 0.5 MG tablet, Take 1 tablet (0.5 mg total) by mouth every 6 (six) hours as needed for anxiety (aggitation)., Disp: 20 tablet, Rfl: 0 .  lovastatin  (MEVACOR) 40 MG tablet, TAKE 1 TABLET AT BEDTIME  (NEW  DOSE), Disp: 90 tablet, Rfl: 3 .  ondansetron (ZOFRAN) 4 MG tablet, Take 4 mg by mouth every 8 (eight) hours as needed for nausea or vomiting., Disp: , Rfl:  .  oxyCODONE (OXY IR/ROXICODONE) 5 MG immediate release tablet, Take 1 tablet (5 mg total) by mouth every 4 (four) hours as needed for severe pain., Disp: 30 tablet, Rfl: 0 .  pantoprazole (PROTONIX) 40 MG tablet, Take 1 tablet (40 mg total) by mouth daily., Disp: 30 tablet, Rfl: 1 .  sacubitril-valsartan (ENTRESTO) 24-26 MG, Take 1 tablet by mouth 2 (two) times daily., Disp: 180 tablet, Rfl: 3 .  traZODone (DESYREL) 50 MG tablet, Take 50 mg by mouth at bedtime. , Disp: , Rfl:  .  warfarin (COUMADIN) 5 MG tablet, Take 1 tablet (5 mg total) by mouth daily at 6 PM., Disp: 40 tablet, Rfl: 3  Past Medical History: Past Medical History:  Diagnosis Date  . Anemia 07/13/2017  . Anxiety   . Aortic insufficiency   . Aortic stenosis, moderate 07/13/2017  . Arthritis    back   . Carotid stenosis    Right carotid stent (widely patent) 40 - 59% left plaque 11/13  . COPD (chronic obstructive pulmonary disease) ([place])   . Coronary artery disease involving native coronary artery of native heart without angina pectoris   .  Depression   . Dyslipidemia   . GERD (gastroesophageal reflux disease)   . Heart murmur   . Hemiplegia affecting unspecified side, late effect of cerebrovascular disease    resolved- from L side   . Hypertension   . Jaundice    resolved following ERCP & Cholecystectomy  . Mild emphysema ([place])   . Mitral regurgitation   . Mitral valve insufficiency and aortic valve insufficiency   . Myocardial infarction ([place])   . Paroxysmal atrial fibrillation ([facility])   . Pre-diabetes    per spouse  . S/P aortic valve replacement with bioprosthetic valve 11/16/2017   23 mm [facility] Ease stented bovine pericardial tissue valve  . S/P CABG x 1 11/16/2017   SVG to PDA with EVH via right  thigh  . S/P Maze operation for atrial fibrillation 11/16/2017   Left side lesion set using bipolar radiofrequency and cryothermy ablation with clipping of LA appendage  . S/P mitral valve replacement with bioprosthetic valve 11/16/2017   27 mm [facility] Mitral stented bovine pericardial tissue valve  . Sleep apnea    does not wear CPAP  . Sleep concern    resulted in surgery- after + sleep test. Pt. doesn't have a problem any longer.   . Stroke ([place]) 03/11/2003   stent placed on the 31, 3, 2004, L side   . Wears glasses   . Wears hearing aid in both ears   . Wears partial dentures     Tobacco Use: Social History   Tobacco Use  Smoking Status Former Smoker  . Packs/day: 0.50  . Years: 57.00  . Pack years: 28.50  . Types: Cigarettes  . Last attempt to quit: 07/13/1997  . Years since quitting: 20.7  Smokeless Tobacco Never Used    Labs: Recent Chemical engineer    Labs for ITP Cardiac and Pulmonary Rehab Latest Ref Rng & Units 11/17/2017 11/17/2017 11/17/2017 12/26/2017 03/15/2018   Cholestrol 0 - 200 mg/dL - - - 132 141   LDLCALC 0 - 99 mg/dL - - - 66 75   HDL >40 mg/dL - - - 47 43   Trlycerides <150 mg/dL - - - 97 117   Hemoglobin A1c 4.8 - 5.6 % - - - - -   PHART 7.350 - 7.450 7.381 7.308(L) - - -   PCO2ART 32.0 - 48.0 mmHg 41.1 40.8 - - -   HCO3 20.0 - 28.0 mmol/L 24.4 20.5 - - -   TCO2 22 - 32 mmol/L 26 22 22  - -   ACIDBASEDEF 0.0 - 2.0 mmol/L 1.0 5.0(H) - - -   O2SAT % 99.0 98.0 - - -      Capillary Blood Glucose: Lab Results  Component Value Date   GLUCAP 114 (H) 11/21/2017   GLUCAP 164 (H) 11/20/2017   GLUCAP 99 11/20/2017   GLUCAP 61 (L) 11/20/2017   GLUCAP 168 (H) 11/20/2017     Exercise Target Goals:    Exercise Program Goal: Individual exercise prescription set using results from initial 6 min walk test and THRR while considering  patient's activity barriers and safety.   Exercise Prescription Goal: Initial exercise prescription builds to 30-45  minutes a day of aerobic activity, 2-3 days per week.  Home exercise guidelines will be given to patient during program as part of exercise prescription that the participant will acknowledge.  Activity Barriers & Risk Stratification: Activity Barriers & Cardiac Risk Stratification - 02/01/18 0937      Activity Barriers &  Cardiac Risk Stratification   Activity Barriers  Deconditioning;Muscular Weakness;Back Problems;Other (comment)    Comments  L shoulder limitations (RTC); R knee discomfort    Cardiac Risk Stratification  High       6 Minute Walk: 6 Minute Walk    Row Name 02/01/18 1146         6 Minute Walk   Phase  Initial     Distance  1322 feet     Walk Time  6 minutes     # of Rest Breaks  0     MPH  2.5     METS  1.6     RPE  11     VO2 Peak  5.7     Resting HR  62 bpm     Resting BP  128/70     Resting Oxygen Saturation   96 %     Exercise Oxygen Saturation  during 6 min walk  100 %     Max Ex. HR  92 bpm     Max Ex. BP  128/74     2 Minute Post BP  118/68        Oxygen Initial Assessment:   Oxygen Re-Evaluation:   Oxygen Discharge (Final Oxygen Re-Evaluation):   Initial Exercise Prescription: Initial Exercise Prescription - 02/01/18 1100      Date of Initial Exercise RX and Referring Provider   Date  02/01/18    Referring Provider  Wyatt Haste MD and Loralie Champagne MD      Bike   Level  0.3    Minutes  10    METs  1.51      NuStep   Level  2    SPM  70    Minutes  10    METs  1.5      Track   Laps  7    Minutes  10    METs  2.23      Prescription Details   Frequency (times per week)  3    Duration  Progress to 30 minutes of continuous aerobic without signs/symptoms of physical distress      Intensity   THRR 40-80% of Max Heartrate  60-119    Ratings of Perceived Exertion  11-13    Perceived Dyspnea  0-4      Progression   Progression  Continue to progress workloads to maintain intensity without signs/symptoms of physical distress.       Resistance Training   Training Prescription  Yes    Weight  2lbs    Reps  10-15       Perform Capillary Blood Glucose checks as needed.  Exercise Prescription Changes: Exercise Prescription Changes    Row Name 02/05/18 1600 02/15/18 1600 02/23/18 0730 03/16/18 0800 03/26/18 1649     Response to Exercise   Blood Pressure (Admit)  104/70  110/60  100/70  104/70  100/66   Blood Pressure (Exercise)  126/70  126/78  132/78  132/70  104/64   Blood Pressure (Exit)  120/64  104/70  98/97  102/78  104/62   Heart Rate (Admit)  75 bpm  62 bpm  70 bpm  57 bpm  67 bpm   Heart Rate (Exercise)  98 bpm  94 bpm  91 bpm  89 bpm  88 bpm   Heart Rate (Exit)  75 bpm  70 bpm  67 bpm  69 bpm  63 bpm   Rating of Perceived Exertion (Exercise)  11  12  12  12  12    Perceived Dyspnea (Exercise)  0  0  0  0  -   Symptoms  None   -  -  -  None   Comments  Pt oriented to exericse equipment  -  -  -  -   Duration  Progress to 30 minutes of  aerobic without signs/symptoms of physical distress  Progress to 30 minutes of  aerobic without signs/symptoms of physical distress  Continue with 30 min of aerobic exercise without signs/symptoms of physical distress.  Continue with 30 min of aerobic exercise without signs/symptoms of physical distress.  Continue with 30 min of aerobic exercise without signs/symptoms of physical distress.   Intensity  THRR New  THRR unchanged  THRR unchanged  THRR unchanged  THRR unchanged     Progression   Progression  Continue to progress workloads to maintain intensity without signs/symptoms of physical distress.  Continue to progress workloads to maintain intensity without signs/symptoms of physical distress.  Continue to progress workloads to maintain intensity without signs/symptoms of physical distress.  Continue to progress workloads to maintain intensity without signs/symptoms of physical distress.  Continue to progress workloads to maintain intensity without signs/symptoms of  physical distress.   Average METs  2.03  2.34  2.28  2.8  2.55     Resistance Training   Training Prescription  Yes  No  No  No  Yes   Weight  2lbs  -  -  -  2lbs   Reps  10-15  -  -  -  10-15   Time  10 Minutes  -  -  -  10 Minutes     Interval Training   Interval Training  No  No  No  No  No     Bike   Level  0.3  0.8  0.8  1  1    Minutes  10  15  15  15  15    METs  1.52  2.38  2.37  2.69  2.7     NuStep   Level  2  3  3  3  4    SPM  75  80  85  85  85   Minutes  10  15  15  15  15    METs  2  2.3  2.2  2.9  2.4     Track   Laps  9  -  -  -  -   Minutes  10  -  -  -  -   METs  2.53  -  -  -  -     Home Exercise Plan   Plans to continue exercise at  -  -  Home (comment) Walking  Home (comment) Walking  Home (comment) Walking    Frequency  -  -  Add 2 additional days to program exercise sessions.  Add 2 additional days to program exercise sessions.  Add 2 additional days to program exercise sessions.   Initial Home Exercises Provided  -  -  02/21/18  02/21/18  02/21/18      Exercise Comments: Exercise Comments    Row Name 02/05/18 1618 02/21/18 1641 03/01/18 0733 03/28/18 1657     Exercise Comments  Today was pt's first day of rehab. Pt was oriented to exericse equipment. Pt is off to great start.   Reviewed HEP with pt. Pt voiced understanding and plans to start exercising at home in addition  to cardiac rehab exercise. Will continue to monitor and follow up with pt about HEP.   Pt is responding well to exericse prescription. Pt states he is feeling stronger since starting rehab. Will continue to progress pt as tolerated and monitor.   Reviewed METs and Goals with pt. Pt has responded well to workload increases. Will continue to monitor and progress pt.        Exercise Goals and Review: Exercise Goals    Row Name 02/01/18 [phone]             Exercise Goals   Increase Physical Activity  Yes       Intervention  Provide advice, education, support and counseling about  physical activity/exercise needs.;Develop an individualized exercise prescription for aerobic and resistive training based on initial evaluation findings, risk stratification, comorbidities and participant's personal goals.       Expected Outcomes  Short Term: Attend rehab on a regular basis to increase amount of physical activity.;Long Term: Add in home exercise to make exercise part of routine and to increase amount of physical activity.;Long Term: Exercising regularly at least 3-5 days a week.       Increase Strength and Stamina  Yes       Intervention  Provide advice, education, support and counseling about physical activity/exercise needs.;Develop an individualized exercise prescription for aerobic and resistive training based on initial evaluation findings, risk stratification, comorbidities and participant's personal goals.       Expected Outcomes  Short Term: Increase workloads from initial exercise prescription for resistance, speed, and METs.;Short Term: Perform resistance training exercises routinely during rehab and add in resistance training at home;Long Term: Improve cardiorespiratory fitness, muscular endurance and strength as measured by increased METs and functional capacity (6MWT)       Able to understand and use rate of perceived exertion (RPE) scale  Yes       Intervention  Provide education and explanation on how to use RPE scale       Expected Outcomes  Short Term: Able to use RPE daily in rehab to express subjective intensity level;Long Term:  Able to use RPE to guide intensity level when exercising independently       Knowledge and understanding of Target Heart Rate Range (THRR)  Yes       Intervention  Provide education and explanation of THRR including how the numbers were predicted and where they are located for reference       Expected Outcomes  Short Term: Able to state/look up THRR;Short Term: Able to use daily as guideline for intensity in rehab;Long Term: Able to use THRR to  govern intensity when exercising independently       Able to check pulse independently  Yes       Intervention  Provide education and demonstration on how to check pulse in carotid and radial arteries.;Review the importance of being able to check your own pulse for safety during independent exercise       Expected Outcomes  Short Term: Able to explain why pulse checking is important during independent exercise;Long Term: Able to check pulse independently and accurately       Understanding of Exercise Prescription  Yes       Intervention  Provide education, explanation, and written materials on patient's individual exercise prescription       Expected Outcomes  Short Term: Able to explain program exercise prescription;Long Term: Able to explain home exercise prescription to exercise independently  Exercise Goals Re-Evaluation : Exercise Goals Re-Evaluation    Row Name 02/21/18 1642 03/01/18 0737 03/28/18 1659         Exercise Goal Re-Evaluation   Exercise Goals Review  Understanding of Exercise Prescription;Increase Physical Activity;Able to understand and use rate of perceived exertion (RPE) scale;Knowledge and understanding of Target Heart Rate Range (THRR);Increase Strength and Stamina  Understanding of Exercise Prescription;Increase Physical Activity;Increase Strength and Stamina  Understanding of Exercise Prescription;Increase Physical Activity;Increase Strength and Stamina     Comments  Reveiwed HEP with pt, in addition to THRR, RPE scale, weather precautions, NTG use, endpoints of exercise, warmup and cool down.   Pt is able to exericse for 30 minutes with minimal difficulty. Will continue to work with pt on progress workloads.   Pt is doing great in rehab. Pt is now exercising at a level 4 on the Nustep with no difficulty. Will work with pt to increase handweight lbs to help aid in improving musclar strength.      Expected Outcomes  Pt plans to exercise 2 additional days at home by  walking. Pt will continue increase his physical activity on nonrehab days. Will continue to follow and monitor pt.   Pt will continue to work on increasing his physical activity on nonrehab days. Pt will continue ot increase cardiorespiratory fitness.   Pt will continue to walk 2 additional days for 15 minutes. Pt will work to increase to 30 minutes gradually. Will continue to follow up with pt about home exercise progress.          Discharge Exercise Prescription (Final Exercise Prescription Changes): Exercise Prescription Changes - 03/26/18 1649      Response to Exercise   Blood Pressure (Admit)  100/66    Blood Pressure (Exercise)  104/64    Blood Pressure (Exit)  104/62    Heart Rate (Admit)  67 bpm    Heart Rate (Exercise)  88 bpm    Heart Rate (Exit)  63 bpm    Rating of Perceived Exertion (Exercise)  12    Symptoms  None    Duration  Continue with 30 min of aerobic exercise without signs/symptoms of physical distress.    Intensity  THRR unchanged      Progression   Progression  Continue to progress workloads to maintain intensity without signs/symptoms of physical distress.    Average METs  2.55      Resistance Training   Training Prescription  Yes    Weight  2lbs    Reps  10-15    Time  10 Minutes      Interval Training   Interval Training  No      Bike   Level  1    Minutes  15    METs  2.7      NuStep   Level  4    SPM  85    Minutes  15    METs  2.4      Home Exercise Plan   Plans to continue exercise at  Home (comment) Walking     Frequency  Add 2 additional days to program exercise sessions.    Initial Home Exercises Provided  02/21/18       Nutrition:  Target Goals: Understanding of nutrition guidelines, daily intake of sodium 1500mg , cholesterol 200mg , calories 30% from fat and 7% or less from saturated fats, daily to have 5 or more servings of fruits and vegetables.  Biometrics: Pre Biometrics - 02/01/18 1153  Pre Biometrics   Height  5'  8.5" (1.74 m)    Weight  242 lb 1 oz (109.8 kg)    Waist Circumference  48 inches    Hip Circumference  45 inches    Waist to Hip Ratio  1.07 %    BMI (Calculated)  36.27    Triceps Skinfold  30 mm    % Body Fat  45.2 %    Grip Strength  30 kg    Flexibility  8 in    Single Leg Stand  30 seconds        Nutrition Therapy Plan and Nutrition Goals: Nutrition Therapy & Goals - 02/12/18 1209      Nutrition Therapy   Diet  Heart Healthy    Drug/Food Interactions  Coumadin/Vit K      Personal Nutrition Goals   Nutrition Goal  Pt to identify and limit food sources of saturated fat, trans fat, and sodium    Personal Goal #2  Pt to identify food quantities necessary to achieve weight loss of 6-24 lb (2.7-10.9 kg) at graduation from cardiac rehab. Goal wt of 200 lb desired.       Intervention Plan   Intervention  Prescribe, educate and counsel regarding individualized specific dietary modifications aiming towards targeted core components such as weight, hypertension, lipid management, diabetes, heart failure and other comorbidities.;Nutrition handout(s) given to patient. Pre-diabetes    Expected Outcomes  Short Term Goal: Understand basic principles of dietary content, such as calories, fat, sodium, cholesterol and nutrients.;Long Term Goal: Adherence to prescribed nutrition plan.       Nutrition Assessments: Nutrition Assessments - 02/01/18 1113      MEDFICTS Scores   Pre Score  48       Nutrition Goals Re-Evaluation:   Nutrition Goals Re-Evaluation:   Nutrition Goals Discharge (Final Nutrition Goals Re-Evaluation):   Psychosocial: Target Goals: Acknowledge presence or absence of significant depression and/or stress, maximize coping skills, provide positive support system. Participant is able to verbalize types and ability to use techniques and skills needed for reducing stress and depression.  Initial Review & Psychosocial Screening: Initial Psych Review & Screening -  02/01/18 0900      Initial Review   Current issues with  None Identified      Family Dynamics   Good Support System?  Yes wife and family       Barriers   Psychosocial barriers to participate in program  There are no identifiable barriers or psychosocial needs.      Screening Interventions   Interventions  Encouraged to exercise    Expected Outcomes  Short Term goal: Identification and review with participant of any Quality of Life or Depression concerns found by scoring the questionnaire.;Long Term goal: The participant improves quality of Life and PHQ9 Scores as seen by post scores and/or verbalization of changes       Quality of Life Scores: Quality of Life - 02/01/18 0900      Quality of Life Scores   Health/Function Pre  24.9 %    Socioeconomic Pre  28.19 %    Psych/Spiritual Pre  26.14 %    Family Pre  24 %    GLOBAL Pre  25.77 %      Scores of 19 and below usually indicate a poorer quality of life in these areas.  A difference of  2-3 points is a clinically meaningful difference.  A difference of 2-3 points in the total score of the Quality  of Life Index has been associated with significant improvement in overall quality of life, self-image, physical symptoms, and general health in studies assessing change in quality of life.  PHQ-9: Recent Review Flowsheet Data    Depression screen [facility] 2/9 02/05/2018   Decreased Interest 0   Down, Depressed, Hopeless 0   PHQ - 2 Score 0     Interpretation of Total Score  Total Score Depression Severity:  1-4 = Minimal depression, 5-9 = Mild depression, 10-14 = Moderate depression, 15-19 = Moderately severe depression, 20-27 = Severe depression   Psychosocial Evaluation and Intervention:   Psychosocial Re-Evaluation: Psychosocial Re-Evaluation    [place] Name 03/01/18 1414 03/29/18 1111           Psychosocial Re-Evaluation   Current issues with  Current Stress Concerns  None Identified      Comments  Will continue to moitnor  the patient for signs of stress as no complaints have been voiced at cardiac rehab  No psychosocial needs identified. No interventio necessary.       Expected Outcomes  -  Pt will continue to utilize good coping skills and maintain a positive outlook.       Interventions  Encouraged to attend Cardiac Rehabilitation for the exercise;Stress management education  Encouraged to attend Cardiac Rehabilitation for the exercise;Stress management education      Continue Psychosocial Services   -  No Follow up required        Initial Review   Source of Stress Concerns  Chronic Illness  -         Psychosocial Discharge (Final Psychosocial Re-Evaluation): Psychosocial Re-Evaluation - 03/29/18 1111      Psychosocial Re-Evaluation   Current issues with  None Identified    Comments  No psychosocial needs identified. No interventio necessary.     Expected Outcomes  Pt will continue to utilize good coping skills and maintain a positive outlook.     Interventions  Encouraged to attend Cardiac Rehabilitation for the exercise;Stress management education    Continue Psychosocial Services   No Follow up required       Vocational Rehabilitation: Provide vocational rehab assistance to qualifying candidates.   Vocational Rehab Evaluation & Intervention: Vocational Rehab - 02/01/18 0900      Initial Vocational Rehab Evaluation & Intervention   Assessment shows need for Vocational Rehabilitation  No retired        Education: Education Goals: Education classes will be provided on a weekly basis, covering required topics. Participant will state understanding/return demonstration of topics presented.  Learning Barriers/Preferences: Learning Barriers/Preferences - 02/01/18 0936      Learning Barriers/Preferences   Learning Barriers  Sight;Hearing glasses; hearing aids    Learning Preferences  Skilled Demonstration;Verbal Instruction;Individual Instruction       Education Topics: Count Your Pulse:   -Group instruction provided by verbal instruction, demonstration, patient participation and written materials to support subject.  Instructors address importance of being able to find your pulse and how to count your pulse when at home without a heart monitor.  Patients get hands on experience counting their pulse with staff help and individually.   CARDIAC REHAB PHASE II EXERCISE from 03/23/2018 in [place]  Date  03/23/18  Instruction Review Code  1- Verbalizes Understanding      Heart Attack, Angina, and Risk Factor Modification:  -Group instruction provided by verbal instruction, video, and written materials to support subject.  Instructors address signs and symptoms of angina and heart  attacks.    Also discuss risk factors for heart disease and how to make changes to improve heart health risk factors.   CARDIAC REHAB PHASE II EXERCISE from 03/23/2018 in [place]  Date  02/07/18  Educator  RN  Instruction Review Code  2- Demonstrated Understanding      Functional Fitness:  -Group instruction provided by verbal instruction, demonstration, patient participation, and written materials to support subject.  Instructors address safety measures for doing things around the house.  Discuss how to get up and down off the floor, how to pick things up properly, how to safely get out of a chair without assistance, and balance training.   CARDIAC REHAB PHASE II EXERCISE from 03/23/2018 in [place]  Date  02/23/18  Instruction Review Code  2- Demonstrated Understanding      Meditation and Mindfulness:  -Group instruction provided by verbal instruction, patient participation, and written materials to support subject.  Instructor addresses importance of mindfulness and meditation practice to help reduce stress and improve awareness.  Instructor also leads participants through a meditation exercise.     Stretching for Flexibility and Mobility:  -Group instruction provided by verbal instruction, patient participation, and written materials to support subject.  Instructors lead participants through series of stretches that are designed to increase flexibility thus improving mobility.  These stretches are additional exercise for major muscle groups that are typically performed during regular warm up and cool down.   Hands Only CPR:  -Group verbal, video, and participation provides a basic overview of AHA guidelines for community CPR. Role-play of emergencies allow participants the opportunity to practice calling for help and chest compression technique with discussion of AED use.   Hypertension: -Group verbal and written instruction that provides a basic overview of hypertension including the most recent diagnostic guidelines, risk factor reduction with self-care instructions and medication management.   CARDIAC REHAB PHASE II EXERCISE from 03/23/2018 in [place]  Date  03/09/18  Instruction Review Code  2- Demonstrated Understanding       Nutrition I class: Heart Healthy Eating:  -Group instruction provided by PowerPoint slides, verbal discussion, and written materials to support subject matter. The instructor gives an explanation and review of the Therapeutic Lifestyle Changes diet recommendations, which includes a discussion on lipid goals, dietary fat, sodium, fiber, plant stanol/sterol esters, sugar, and the components of a well-balanced, healthy diet.   Nutrition II class: Lifestyle Skills:  -Group instruction provided by PowerPoint slides, verbal discussion, and written materials to support subject matter. The instructor gives an explanation and review of label reading, grocery shopping for heart health, heart healthy recipe modifications, and ways to make healthier choices when eating out.   Diabetes Question & Answer:  -Group instruction  provided by PowerPoint slides, verbal discussion, and written materials to support subject matter. The instructor gives an explanation and review of diabetes co-morbidities, pre- and post-prandial blood glucose goals, pre-exercise blood glucose goals, signs, symptoms, and treatment of hypoglycemia and hyperglycemia, and foot care basics.   CARDIAC REHAB PHASE II EXERCISE from 03/23/2018 in [place]  Date  03/16/18  Educator  RD  Instruction Review Code  2- Demonstrated Understanding      Diabetes Blitz:  -Group instruction provided by PowerPoint slides, verbal discussion, and written materials to support subject matter. The instructor gives an explanation and review of the physiology behind type 1 and type 2 diabetes, diabetes  medications and rational behind using different medications, pre- and post-prandial blood glucose recommendations and Hemoglobin A1c goals, diabetes diet, and exercise including blood glucose guidelines for exercising safely.    Portion Distortion:  -Group instruction provided by PowerPoint slides, verbal discussion, written materials, and food models to support subject matter. The instructor gives an explanation of serving size versus portion size, changes in portions sizes over the last 20 years, and what consists of a serving from each food group.   CARDIAC REHAB PHASE II EXERCISE from 03/23/2018 in [place]  Date  03/07/18  Educator  RD  Instruction Review Code  2- Demonstrated Understanding      Stress Management:  -Group instruction provided by verbal instruction, video, and written materials to support subject matter.  Instructors review role of stress in heart disease and how to cope with stress positively.     Exercising on Your Own:  -Group instruction provided by verbal instruction, power point, and written materials to support subject.  Instructors discuss benefits of exercise, components of  exercise, frequency and intensity of exercise, and end points for exercise.  Also discuss use of nitroglycerin and activating EMS.  Review options of places to exercise outside of rehab.  Review guidelines for sex with heart disease.   CARDIAC REHAB PHASE II EXERCISE from 03/23/2018 in [place]  Date  02/14/18  Instruction Review Code  2- Demonstrated Understanding      Cardiac Drugs I:  -Group instruction provided by verbal instruction and written materials to support subject.  Instructor reviews cardiac drug classes: antiplatelets, anticoagulants, beta blockers, and statins.  Instructor discusses reasons, side effects, and lifestyle considerations for each drug class.   Cardiac Drugs II:  -Group instruction provided by verbal instruction and written materials to support subject.  Instructor reviews cardiac drug classes: angiotensin converting enzyme inhibitors (ACE-I), angiotensin II receptor blockers (ARBs), nitrates, and calcium channel blockers.  Instructor discusses reasons, side effects, and lifestyle considerations for each drug class.   CARDIAC REHAB PHASE II EXERCISE from 03/23/2018 in [place]  Date  03/21/18  Instruction Review Code  2- Demonstrated Understanding      Anatomy and Physiology of the Circulatory System:  Group verbal and written instruction and models provide basic cardiac anatomy and physiology, with the coronary electrical and arterial systems. Review of: AMI, Angina, Valve disease, Heart Failure, Peripheral Artery Disease, Cardiac Arrhythmia, Pacemakers, and the ICD.   Other Education:  -Group or individual verbal, written, or video instructions that support the educational goals of the cardiac rehab program.   CARDIAC REHAB PHASE II EXERCISE from 03/23/2018 in [place]  Date  02/21/18  Educator  CVA video  Instruction Review Code  2- Demonstrated Understanding       Holiday Eating Survival Tips:  -Group instruction provided by PowerPoint slides, verbal discussion, and written materials to support subject matter. The instructor gives patients tips, tricks, and techniques to help them not only survive but enjoy the holidays despite the onslaught of food that accompanies the holidays.   Knowledge Questionnaire Score: Knowledge Questionnaire Score - 02/01/18 0859      Knowledge Questionnaire Score   Pre Score  22/24       Core Components/Risk Factors/Patient Goals at Admission: Personal Goals and Risk Factors at Admission - 02/01/18 1154      Core Components/Risk Factors/Patient Goals on Admission   Tobacco Cessation  Yes quit date  07/2017    Number of packs per day  1/2 ppd x 50 years    Intervention  Assist the participant in steps to quit. Provide individualized education and counseling about committing to Tobacco Cessation, relapse prevention, and pharmacological support that can be provided by physician.;Advice worker, assist with locating and accessing local/national Quit Smoking programs, and support quit date choice.    Expected Outcomes  Short Term: Will demonstrate readiness to quit, by selecting a quit date.;Short Term: Will quit all tobacco product use, adhering to prevention of relapse plan.;Long Term: Complete abstinence from all tobacco products for at least 12 months from quit date.    Heart Failure  Yes    Intervention  Provide a combined exercise and nutrition program that is supplemented with education, support and counseling about heart failure. Directed toward relieving symptoms such as shortness of breath, decreased exercise tolerance, and extremity edema.    Expected Outcomes  Improve functional capacity of life;Short term: Attendance in program 2-3 days a week with increased exercise capacity. Reported lower sodium intake. Reported increased fruit and vegetable intake. Reports medication compliance.;Short term:  Daily weights obtained and reported for increase. Utilizing diuretic protocols set by physician.;Long term: Adoption of self-care skills and reduction of barriers for early signs and symptoms recognition and intervention leading to self-care maintenance.    Hypertension  Yes    Intervention  Provide education on lifestyle modifcations including regular physical activity/exercise, weight management, moderate sodium restriction and increased consumption of fresh fruit, vegetables, and low fat dairy, alcohol moderation, and smoking cessation.;Monitor prescription use compliance.    Expected Outcomes  Short Term: Continued assessment and intervention until BP is < 140/17mm HG in hypertensive participants. < 130/25mm HG in hypertensive participants with diabetes, heart failure or chronic kidney disease.;Long Term: Maintenance of blood pressure at goal levels.    Lipids  Yes    Intervention  Provide education and support for participant on nutrition & aerobic/resistive exercise along with prescribed medications to achieve LDL 70mg , HDL >40mg .    Expected Outcomes  Short Term: Participant states understanding of desired cholesterol values and is compliant with medications prescribed. Participant is following exercise prescription and nutrition guidelines.;Long Term: Cholesterol controlled with medications as prescribed, with individualized exercise RX and with personalized nutrition plan. Value goals: LDL < 70mg , HDL > 40 mg.       Core Components/Risk Factors/Patient Goals Review:  Goals and Risk Factor Review    Row Name 03/01/18 1406 03/29/18 1109           Core Components/Risk Factors/Patient Goals Review   Personal Goals Review  Weight Management/Obesity;Heart Failure;Hypertension;Lipids  Weight Management/Obesity;Heart Failure;Hypertension;Lipids      Review  Hutson's recent post exercise blood pressures have been low at cardiac rehab. Ryman has been doing well with exercise and has been  asymptomatic.  Cherry's blood pressures have been maintaining within parameters defined by MD. Laverna Peace has been doing well with exercise and has been asymptomatic.[name]      Expected Outcomes  Will continue to monitor vital signs at cardiac rehab. Bernadette will continue to take his medications as prescribed.  Maurio will continue to participate in CR exercise, nutrition, and lifestyle modification opportunities.          Core Components/Risk Factors/Patient Goals at Discharge (Final Review):  Goals and Risk Factor Review - 03/29/18 1109      Core Components/Risk Factors/Patient Goals Review   Personal Goals Review  Weight Management/Obesity;Heart Failure;Hypertension;Lipids    Review  Tyrese's blood  pressures have been maintaining within parameters defined by MD. Laverna Peace has been doing well with exercise and has been asymptomatic.[name]    Expected Outcomes  Mana will continue to participate in CR exercise, nutrition, and lifestyle modification opportunities.        ITP Comments: ITP Comments    Row Name 02/01/18 0853 03/01/18 1404 03/29/18 1108       ITP Comments  Dr. Fransico Him, Medical Director   30 day ITP review. Patient off to a good start to exercise. Will continue to monitor BP  30 Day ITP Review.  Pt demonstrates willingness to attend CR Program.  BP has been low at times but within parameters set by MD and pt is asymptomatic.         Comments: See ITP Comments.

## 2018-03-30 ENCOUNTER — Encounter (HOSPITAL_COMMUNITY)
Admission: RE | Admit: 2018-03-30 | Discharge: 2018-03-30 | Disposition: A | Discharge status: Home / Self Care | Payer: Medicare HMO | Source: Ambulatory Visit | Attending: Internal Medicine | Admitting: Internal Medicine

## 2018-03-30 ENCOUNTER — Other Ambulatory Visit (HOSPITAL_COMMUNITY): Payer: Medicare HMO

## 2018-03-30 DIAGNOSIS — K219 Gastro-esophageal reflux disease without esophagitis: Secondary | ICD-10-CM | POA: Diagnosis not present

## 2018-03-30 DIAGNOSIS — Z87891 Personal history of nicotine dependence: Secondary | ICD-10-CM | POA: Diagnosis not present

## 2018-03-30 DIAGNOSIS — Z79899 Other long term (current) drug therapy: Secondary | ICD-10-CM | POA: Diagnosis not present

## 2018-03-30 DIAGNOSIS — Z953 Presence of xenogenic heart valve: Secondary | ICD-10-CM | POA: Diagnosis not present

## 2018-03-30 DIAGNOSIS — Z8673 Personal history of transient ischemic attack (TIA), and cerebral infarction without residual deficits: Secondary | ICD-10-CM | POA: Diagnosis not present

## 2018-03-30 DIAGNOSIS — Z79891 Long term (current) use of opiate analgesic: Secondary | ICD-10-CM | POA: Diagnosis not present

## 2018-03-30 DIAGNOSIS — Z952 Presence of prosthetic heart valve: Secondary | ICD-10-CM

## 2018-03-30 DIAGNOSIS — Z951 Presence of aortocoronary bypass graft: Secondary | ICD-10-CM

## 2018-03-30 DIAGNOSIS — J449 Chronic obstructive pulmonary disease, unspecified: Secondary | ICD-10-CM | POA: Diagnosis not present

## 2018-03-30 DIAGNOSIS — Z9889 Other specified postprocedural states: Secondary | ICD-10-CM | POA: Diagnosis not present

## 2018-04-02 ENCOUNTER — Encounter (HOSPITAL_COMMUNITY)
Admission: RE | Admit: 2018-04-02 | Discharge: 2018-04-02 | Disposition: A | Discharge status: Home / Self Care | Payer: Medicare HMO | Source: Ambulatory Visit | Attending: Internal Medicine | Admitting: Internal Medicine

## 2018-04-02 DIAGNOSIS — K219 Gastro-esophageal reflux disease without esophagitis: Secondary | ICD-10-CM | POA: Diagnosis not present

## 2018-04-02 DIAGNOSIS — Z951 Presence of aortocoronary bypass graft: Secondary | ICD-10-CM | POA: Diagnosis not present

## 2018-04-02 DIAGNOSIS — Z87891 Personal history of nicotine dependence: Secondary | ICD-10-CM | POA: Diagnosis not present

## 2018-04-02 DIAGNOSIS — Z79899 Other long term (current) drug therapy: Secondary | ICD-10-CM | POA: Diagnosis not present

## 2018-04-02 DIAGNOSIS — J449 Chronic obstructive pulmonary disease, unspecified: Secondary | ICD-10-CM | POA: Diagnosis not present

## 2018-04-02 DIAGNOSIS — Z9889 Other specified postprocedural states: Secondary | ICD-10-CM | POA: Diagnosis not present

## 2018-04-02 DIAGNOSIS — Z953 Presence of xenogenic heart valve: Secondary | ICD-10-CM | POA: Diagnosis not present

## 2018-04-02 DIAGNOSIS — Z952 Presence of prosthetic heart valve: Secondary | ICD-10-CM

## 2018-04-02 DIAGNOSIS — Z79891 Long term (current) use of opiate analgesic: Secondary | ICD-10-CM | POA: Diagnosis not present

## 2018-04-02 DIAGNOSIS — Z8673 Personal history of transient ischemic attack (TIA), and cerebral infarction without residual deficits: Secondary | ICD-10-CM | POA: Diagnosis not present

## 2018-04-04 ENCOUNTER — Encounter (HOSPITAL_COMMUNITY)
Admission: RE | Admit: 2018-04-04 | Discharge: 2018-04-04 | Disposition: A | Discharge status: Home / Self Care | Payer: Medicare HMO | Source: Ambulatory Visit | Attending: Internal Medicine | Admitting: Internal Medicine

## 2018-04-04 ENCOUNTER — Ambulatory Visit (HOSPITAL_COMMUNITY)
Admission: RE | Admit: 2018-04-04 | Discharge: 2018-04-04 | Disposition: A | Discharge status: Home / Self Care | Payer: Medicare HMO | Source: Ambulatory Visit | Attending: Internal Medicine | Admitting: Internal Medicine

## 2018-04-04 DIAGNOSIS — D509 Iron deficiency anemia, unspecified: Secondary | ICD-10-CM | POA: Insufficient documentation

## 2018-04-04 DIAGNOSIS — Z952 Presence of prosthetic heart valve: Secondary | ICD-10-CM

## 2018-04-04 DIAGNOSIS — I5022 Chronic systolic (congestive) heart failure: Secondary | ICD-10-CM | POA: Insufficient documentation

## 2018-04-04 DIAGNOSIS — Z951 Presence of aortocoronary bypass graft: Secondary | ICD-10-CM

## 2018-04-04 LAB — BASIC METABOLIC PANEL
Anion gap: 10 (ref 5–15)
BUN: 23 mg/dL — AB (ref 6–20)
CO2: 21 mmol/L — ABNORMAL LOW (ref 22–32)
CREATININE: 1.71 mg/dL — AB (ref 0.61–1.24)
Calcium: 8.9 mg/dL (ref 8.9–10.3)
Chloride: 105 mmol/L (ref 101–111)
GFR calc Af Amer: 44 mL/min — ABNORMAL LOW (ref >60)
GFR, EST NON AFRICAN AMERICAN: 38 mL/min — AB (ref >60)
Glucose, Bld: 105 mg/dL — ABNORMAL HIGH (ref 65–99)
POTASSIUM: 4.4 mmol/L (ref 3.5–5.1)
Sodium: 136 mmol/L (ref 135–145)

## 2018-04-04 LAB — IRON AND TIBC
IRON: 99 ug/dL (ref 45–182)
Saturation Ratios: 37 % (ref 17.9–39.5)
TIBC: 270 ug/dL (ref 250–450)
UIBC: 171 ug/dL

## 2018-04-04 LAB — FERRITIN: FERRITIN: 303 ng/mL (ref 24–336)

## 2018-04-06 ENCOUNTER — Other Ambulatory Visit (HOSPITAL_COMMUNITY): Payer: Self-pay

## 2018-04-06 ENCOUNTER — Telehealth (HOSPITAL_COMMUNITY): Payer: Self-pay

## 2018-04-06 ENCOUNTER — Encounter (HOSPITAL_COMMUNITY): Payer: Medicare HMO

## 2018-04-06 ENCOUNTER — Encounter (HOSPITAL_COMMUNITY): Payer: Self-pay

## 2018-04-06 ENCOUNTER — Ambulatory Visit (HOSPITAL_COMMUNITY)
Admission: RE | Admit: 2018-04-06 | Discharge: 2018-04-06 | Disposition: A | Discharge status: Home / Self Care | Payer: Medicare HMO | Source: Ambulatory Visit | Attending: Internal Medicine | Admitting: Internal Medicine

## 2018-04-06 VITALS — BP 102/78 | HR 101 | Wt 249.8 lb

## 2018-04-06 DIAGNOSIS — I5032 Chronic diastolic (congestive) heart failure: Secondary | ICD-10-CM | POA: Diagnosis not present

## 2018-04-06 DIAGNOSIS — Z8673 Personal history of transient ischemic attack (TIA), and cerebral infarction without residual deficits: Secondary | ICD-10-CM

## 2018-04-06 DIAGNOSIS — I38 Endocarditis, valve unspecified: Secondary | ICD-10-CM | POA: Diagnosis not present

## 2018-04-06 DIAGNOSIS — D649 Anemia, unspecified: Secondary | ICD-10-CM | POA: Diagnosis not present

## 2018-04-06 DIAGNOSIS — Z950 Presence of cardiac pacemaker: Secondary | ICD-10-CM | POA: Diagnosis not present

## 2018-04-06 DIAGNOSIS — I480 Paroxysmal atrial fibrillation: Secondary | ICD-10-CM | POA: Diagnosis not present

## 2018-04-06 DIAGNOSIS — I2510 Atherosclerotic heart disease of native coronary artery without angina pectoris: Secondary | ICD-10-CM | POA: Insufficient documentation

## 2018-04-06 DIAGNOSIS — J449 Chronic obstructive pulmonary disease, unspecified: Secondary | ICD-10-CM | POA: Diagnosis not present

## 2018-04-06 DIAGNOSIS — Z7901 Long term (current) use of anticoagulants: Secondary | ICD-10-CM | POA: Diagnosis not present

## 2018-04-06 DIAGNOSIS — Z79899 Other long term (current) drug therapy: Secondary | ICD-10-CM | POA: Diagnosis not present

## 2018-04-06 DIAGNOSIS — I5022 Chronic systolic (congestive) heart failure: Secondary | ICD-10-CM

## 2018-04-06 DIAGNOSIS — Z87891 Personal history of nicotine dependence: Secondary | ICD-10-CM | POA: Insufficient documentation

## 2018-04-06 DIAGNOSIS — E785 Hyperlipidemia, unspecified: Secondary | ICD-10-CM | POA: Diagnosis not present

## 2018-04-06 DIAGNOSIS — N183 Chronic kidney disease, stage 3 unspecified: Secondary | ICD-10-CM

## 2018-04-06 DIAGNOSIS — I130 Hypertensive heart and chronic kidney disease with heart failure and stage 1 through stage 4 chronic kidney disease, or unspecified chronic kidney disease: Secondary | ICD-10-CM | POA: Insufficient documentation

## 2018-04-06 MED ORDER — AMIODARONE HCL 200 MG PO TABS: 200.0000 mg | ORAL_TABLET | Freq: Two times a day (BID) | ORAL | 3 refills | Status: DC

## 2018-04-06 MED FILL — AMIODARONE HCL 200 MG TAB: 200 | 30 days supply | Qty: 60 | Fill #0

## 2018-04-06 NOTE — Telephone Encounter (Signed)
Per Dr. Aundra Dubin  Pt needs to have an EKG. Called left VM for pt to call back.

## 2018-04-06 NOTE — Patient Instructions (Addendum)
START Amiodarone 200 mg, one tab twice a day  Your physician has recommended that you have a Cardioversion (DCCV). Electrical Cardioversion uses a jolt of electricity to your heart either through paddles or wired patches attached to your chest. This is a controlled, usually prescheduled, procedure. Defibrillation is done under light anesthesia in the hospital, and you usually go home the day of the procedure. This is done to get your heart back into a normal rhythm. You are not awake for the procedure. Please see the instruction sheet given to you today.  Your physician recommends that you schedule a follow-up appointment in: 6 weeks with Dr Aundra Dubin with EKG  _________________________________________________ Marin Roberts Code:   Do the following things EVERYDAY: 1) Weigh yourself in the morning before breakfast. Write it down and keep it in a log. 2) Take your medicines as prescribed 3) Eat low salt foods-Limit salt (sodium) to 2000 mg per day.  4) Stay as active as you can everyday 5) Limit all fluids for the day to less than 2 liters

## 2018-04-06 NOTE — Progress Notes (Signed)
Advanced Heart Failure Clinic Note   PCP: Dr. Justin Mend Cardiology: Dr. Radford Pax HF Cardiology: Dr. Aundra Dubin  72 y.o.with history of CVA, paroxysmal atrial fibrillation, valvular heart disease, and chronic diastolic CHF presents for evaluation of CHF and valvular heart disease.    He had had episodes of dyspnea and initial workup led to a TEE in 5/18 showing normal EF with moderate AS and moderate MR.  He continued to have episodic dyspnea and ended up admitted in 8/18 with shortness of breath and chest pressure.  This led to a long, complicated hospitalization.  He was noted to be volume overloaded with acute diastolic CHF and was also noted to have symptomatic runs of atrial fibrillation with RVR.  Amiodarone was started to control the atrial fibrillation.  He developed respiratory distress => Bipap => intubated.  He became hypotensive, requiring pressors.  He developed AKI.  PNA was noted and he received broad spectrum abx.  He had a prolonged intubation but was eventually extubated.  Given deconditioning from long hospitalization, he went to CIR for a couple of weeks.  LHC in 9/18 showed occluded PDA with left>right collaterals.  TEE in 9/18 showed EF 50%, severe MR (possibly infarct-related with restricted posterior leaflet, and moderate AS + moderate-severe AI (possibly rheumatic).   In 12/18, he had cardiac surgery with bioprosthetic aortic and mitral valves placed.  He had SVG-PDA, Maze, and LA appendage clipping.  Post-op course was complicated by CHB requiring Medtronic dual chamber PPM (His bundle lead placed but captured right bundle so induced LBBB).   Echo 01/02/18 LVEF 30-35%, bioprosthetic MV and AoV function normally, RV mildly dilated with normal systolic function.    Today he is being seen for an acute visit for Atrial Tach versus A flutter. Overall feeling fine. Denies SOB/PND/Orthopnea. Appetite ok. No fever or chills. Weight at home trending up from 243 to 246 pounds. No bleeding  problems. Taking all medications.   Labs (9/18): K 4.5, creatinine 1.42, LDL 90, HDL 36, LFTs normal, TSH normal, hgb 9.6 Labs (10/18): K 4.3, creatinine 1.45 Labs (12/18): K 3.8, creatinine 1.56 Labs (1/19): creatinine 1.68, LDL 66 Labs (3/19): K 4.2, creatinine 1.56 Labs 03/12/2018: INR 2  ECG Atrial Tach versus A flutter 103 bpm  PMH: 1. CVA: Right MCA, 2004.  Minimal residual problems.  2. HTN 3. Hyperlipidemia 4. Left rotator cuff surgery 6/18 5. Carotid stenosis: s/p right carotid stent.  6. GERD 7. H/o CCY 8. Anemia: FOBT negative.  9. Gout  10. Atrial fibrillation: Paroxysmal. Maze in 12/18 with LAA clipping.  11. Valvular heart disease: TEE (5/18) with EF 55-60%, moderate AS mean 33 mmHg, moderate MR, normal RV size and systolic function.  - Echo (8/18): EF 55-60%, moderate LVH, moderate AS with mean gradient 33 mmHg, moderate AI, moderate to severe MR.  - TEE (9/18): EF 50%, mild LV dilation, suspect severe MR with posterior leaflet restricted (?infarct-related MR), ERO 0.4 cm^2, moderate AS/moderate-severe AI (possibly rheumatic).   - In 12/18, he had cardiac surgery with bioprosthetic aortic and mitral valves placed.  He had SVG-PDA, Maze, and LA appendage clipping. - Echo (1/19): LVEF 30-35%, bioprosthetic MV and AoV function normally, RV mildly dilated with normal systolic function.   12. Chronic diastolic CHF 13. Deafness 14. CKD: stage 3.  15. 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  17. COPD: Mild obstruction  on 9/18 PFTs.  18. Post-op CHB in 12/18 with Medtronic dual chamber PPM.  19. Chronic systolic CHF: Echo (2/70) with EF down to 30-35% post-op.   SH: Quit smoking 8/18.  Married, 2 children, retired.    Family History  Problem Relation Age of Onset  . Stroke Father        No details  . Angina Mother    Review of  systems complete and found to be negative unless listed in HPI.    Current Outpatient Medications  Medication Sig Dispense Refill  . acetaminophen (TYLENOL) 325 MG tablet Take 1 tablet (325 mg total) by mouth every 6 (six) hours as needed for mild pain. 30 tablet 0  . calcium carbonate (TUMS - DOSED IN MG ELEMENTAL CALCIUM) 500 MG chewable tablet Chew 1 tablet by mouth daily as needed for indigestion or heartburn.    . carvedilol (COREG) 6.25 MG tablet Take 1 tablet (6.25 mg total) by mouth 2 (two) times daily. 180 tablet 3  . cetirizine (ZYRTEC) 10 MG tablet Take 10 mg by mouth daily as needed for allergies.    . citalopram (CELEXA) 20 MG tablet Take 1 tablet (20 mg total) by mouth at bedtime. 30 tablet 5  . docusate sodium (COLACE) 100 MG capsule Take 1 capsule (100 mg total) by mouth 3 (three) times daily as needed. 20 capsule 0  . furosemide (LASIX) 40 MG tablet Take 60 mg (1.5 tabs), alternating with 40 mg (1 tab) every other day 75 tablet 3  . ipratropium (ATROVENT) 0.03 % nasal spray Place 2 sprays into both nostrils every 12 (twelve) hours.    [name] LORazepam (ATIVAN) 0.5 MG tablet Take 1 tablet (0.5 mg total) by mouth every 6 (six) hours as needed for anxiety (aggitation). 20 tablet 0  . lovastatin (MEVACOR) 40 MG tablet TAKE 1 TABLET AT BEDTIME  (NEW  DOSE) 90 tablet 3  . ondansetron (ZOFRAN) 4 MG tablet Take 4 mg by mouth every 8 (eight) hours as needed for nausea or vomiting.    [name] oxyCODONE (OXY IR/ROXICODONE) 5 MG immediate release tablet Take 1 tablet (5 mg total) by mouth every 4 (four) hours as needed for severe pain. 30 tablet 0  . pantoprazole (PROTONIX) 40 MG tablet Take 1 tablet (40 mg total) by mouth daily. 30 tablet 1  . sacubitril-valsartan (ENTRESTO) 24-26 MG Take 1 tablet by mouth 2 (two) times daily. 180 tablet 3  . traZODone (DESYREL) 50 MG tablet Take 50 mg by mouth at bedtime.     [name] warfarin (COUMADIN) 5 MG tablet Take 1 tablet (5 mg total) by mouth daily at 6 PM. 40  tablet 3  . amiodarone (PACERONE) 200 MG tablet Take 1 tablet (200 mg total) by mouth 2 (two) times daily. 60 tablet 3   No current facility-administered medications for this encounter.    Vitals:   04/06/18 1019 04/06/18 1117  BP:  102/78  Pulse:  (!) 101  SpO2:  95%  Weight: 249 lb 12.8 oz (113.3 kg)    Wt Readings from Last 3 Encounters:  04/06/18 249 lb 12.8 oz (113.3 kg)  03/15/18 247 lb (112 kg)  03/07/18 240 lb (108.9 kg)   General:  Well appearing. No resp difficulty HEENT: normal Neck: supple. JVP 7-8  Carotids 2+ bilat; no bruits. No lymphadenopathy or thryomegaly appreciated. Cor: PMI nondisplaced. Tachy Regular rate & rhythm. No rubs, gallops or murmurs. Lungs: clear Abdomen: soft, nontender, nondistended. No hepatosplenomegaly. No bruits or masses. Good  bowel sounds. Extremities: no cyanosis, clubbing, rash, edema Neuro: alert & orientedx3, cranial nerves grossly intact. moves all 4 extremities w/o difficulty. Affect pleasant   EKG: Atrial Tach 103 bpm   Assessment/Plan: 1. Valvular heart disease: TEE in 9/18 showed severe MR, probably infarct-related with restrictive posterior mitral leaflet. There was moderate aortic stenosis and moderate-severe aortic insufficiency, possible rheumatic.  In 12/18, he had bioprosthetic MVR and AVR.  - Valves stable on 1/19 echo.  - Continue cardiac rehab.  2. Chronic combined CHF: In setting of significant valvular disease as above. EF down to 30-35% post-op (1/19 echo), likely reflects true LV systolic function without the volume load from severe MR and moderate-severe AI.  Mild volume overload on exam with weight up.  BP has been soft at cardiac rehab.  NYHA II. Volume status stable.  Continue current dose of lasix.    - Continue Coreg 6.25 mg BID - Continue Entresto 24/26 bid.  -Consider spiro at next appointment.  3. CKD: Stage 3. I reviewed BMET from 04/04/2018.   4. Atrial fibrillation:  Back in Atrial tach versus A  flutter. Unsuccessful at pacing out with Medtronic Rep. Start amio 200 mg twice a day.  - Set up for DC-CV next week.  - INR therapeutic on 4/1.   - He is on warfarin.  5. COPD: Mild obstruction on PFTs.  He has quit smoking.  Stable. No change.   6. CVA: Remote, minimal residual. S/p carotid stenting.  - No ASA given warfarin use.  7. Hyperlipidemia: Check lipids today.   8. Anemia: Fe deficiency.  FOBT negative 8/18.  Got feraheme in 8/18.   - CBC 9. 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  The artery to the PDA was occluded with left to right collaterals.  He had SVG-PDA in 12/18. No chest pain.  - Continue statin.   - No ASA given warfarin use.  10. CHB: has Medtronic PPM.  Minimal RV pacing now.   I personally contacted EP for device interrogation. Discussed with Chanetta Marshall and she evaluated him in the HF clinic. Today he is in  A Tach/A Flutter since 4/18//2019. Restart amio 200 mg twice a day. Set up for cardioversion for next week.    Greater than 50% of the (total minutes 25) visit spent in counseling/coordination of care regarding recurrent A flutter, medication changes and cardioversion.    Follow with Dr Aundra Dubin in 6 weeks with an EKG.  Adyn Hoes NP-C  04/06/2018

## 2018-04-06 NOTE — Progress Notes (Signed)
Pt ecg was abnormal given to Amy, NP for review. Pt will be a full visit due to further assessment.

## 2018-04-06 NOTE — H&P (View-Only) (Signed)
Advanced Heart Failure Clinic Note   PCP: Dr. Justin Mend Cardiology: Dr. Radford Pax HF Cardiology: Dr. Aundra Dubin  72 y.o.with history of CVA, paroxysmal atrial fibrillation, valvular heart disease, and chronic diastolic CHF presents for evaluation of CHF and valvular heart disease.    He had had episodes of dyspnea and initial workup led to a TEE in 5/18 showing normal EF with moderate AS and moderate MR.  He continued to have episodic dyspnea and ended up admitted in 8/18 with shortness of breath and chest pressure.  This led to a long, complicated hospitalization.  He was noted to be volume overloaded with acute diastolic CHF and was also noted to have symptomatic runs of atrial fibrillation with RVR.  Amiodarone was started to control the atrial fibrillation.  He developed respiratory distress => Bipap => intubated.  He became hypotensive, requiring pressors.  He developed AKI.  PNA was noted and he received broad spectrum abx.  He had a prolonged intubation but was eventually extubated.  Given deconditioning from long hospitalization, he went to CIR for a couple of weeks.  LHC in 9/18 showed occluded PDA with left>right collaterals.  TEE in 9/18 showed EF 50%, severe MR (possibly infarct-related with restricted posterior leaflet, and moderate AS + moderate-severe AI (possibly rheumatic).   In 12/18, he had cardiac surgery with bioprosthetic aortic and mitral valves placed.  He had SVG-PDA, Maze, and LA appendage clipping.  Post-op course was complicated by CHB requiring Medtronic dual chamber PPM (His bundle lead placed but captured right bundle so induced LBBB).   Echo 01/02/18 LVEF 30-35%, bioprosthetic MV and AoV function normally, RV mildly dilated with normal systolic function.    Today he is being seen for an acute visit for Atrial Tach versus A flutter. Overall feeling fine. Denies SOB/PND/Orthopnea. Appetite ok. No fever or chills. Weight at home trending up from 243 to 246 pounds. No bleeding  problems. Taking all medications.   Labs (9/18): K 4.5, creatinine 1.42, LDL 90, HDL 36, LFTs normal, TSH normal, hgb 9.6 Labs (10/18): K 4.3, creatinine 1.45 Labs (12/18): K 3.8, creatinine 1.56 Labs (1/19): creatinine 1.68, LDL 66 Labs (3/19): K 4.2, creatinine 1.56 Labs 03/12/2018: INR 2  ECG Atrial Tach versus A flutter 103 bpm  PMH: 1. CVA: Right MCA, 2004.  Minimal residual problems.  2. HTN 3. Hyperlipidemia 4. Left rotator cuff surgery 6/18 5. Carotid stenosis: s/p right carotid stent.  6. GERD 7. H/o CCY 8. Anemia: FOBT negative.  9. Gout  10. Atrial fibrillation: Paroxysmal. Maze in 12/18 with LAA clipping.  11. Valvular heart disease: TEE (5/18) with EF 55-60%, moderate AS mean 33 mmHg, moderate MR, normal RV size and systolic function.  - Echo (8/18): EF 55-60%, moderate LVH, moderate AS with mean gradient 33 mmHg, moderate AI, moderate to severe MR.  - TEE (9/18): EF 50%, mild LV dilation, suspect severe MR with posterior leaflet restricted (?infarct-related MR), ERO 0.4 cm^2, moderate AS/moderate-severe AI (possibly rheumatic).   - In 12/18, he had cardiac surgery with bioprosthetic aortic and mitral valves placed.  He had SVG-PDA, Maze, and LA appendage clipping. - Echo (1/19): LVEF 30-35%, bioprosthetic MV and AoV function normally, RV mildly dilated with normal systolic function.   12. Chronic diastolic CHF 13. Deafness 14. CKD: stage 3.  15. 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  17. COPD: Mild obstruction  on 9/18 PFTs.  18. Post-op CHB in 12/18 with Medtronic dual chamber PPM.  19. Chronic systolic CHF: Echo (3/61) with EF down to 30-35% post-op.   SH: Quit smoking 8/18.  Married, 2 children, retired.    Family History  Problem Relation Age of Onset  . Stroke Father        No details  . Angina Mother    Review of  systems complete and found to be negative unless listed in HPI.    Current Outpatient Medications  Medication Sig Dispense Refill  . acetaminophen (TYLENOL) 325 MG tablet Take 1 tablet (325 mg total) by mouth every 6 (six) hours as needed for mild pain. 30 tablet 0  . calcium carbonate (TUMS - DOSED IN MG ELEMENTAL CALCIUM) 500 MG chewable tablet Chew 1 tablet by mouth daily as needed for indigestion or heartburn.    . carvedilol (COREG) 6.25 MG tablet Take 1 tablet (6.25 mg total) by mouth 2 (two) times daily. 180 tablet 3  . cetirizine (ZYRTEC) 10 MG tablet Take 10 mg by mouth daily as needed for allergies.    . citalopram (CELEXA) 20 MG tablet Take 1 tablet (20 mg total) by mouth at bedtime. 30 tablet 5  . docusate sodium (COLACE) 100 MG capsule Take 1 capsule (100 mg total) by mouth 3 (three) times daily as needed. 20 capsule 0  . furosemide (LASIX) 40 MG tablet Take 60 mg (1.5 tabs), alternating with 40 mg (1 tab) every other day 75 tablet 3  . ipratropium (ATROVENT) 0.03 % nasal spray Place 2 sprays into both nostrils every 12 (twelve) hours.    [name] LORazepam (ATIVAN) 0.5 MG tablet Take 1 tablet (0.5 mg total) by mouth every 6 (six) hours as needed for anxiety (aggitation). 20 tablet 0  . lovastatin (MEVACOR) 40 MG tablet TAKE 1 TABLET AT BEDTIME  (NEW  DOSE) 90 tablet 3  . ondansetron (ZOFRAN) 4 MG tablet Take 4 mg by mouth every 8 (eight) hours as needed for nausea or vomiting.    [name] oxyCODONE (OXY IR/ROXICODONE) 5 MG immediate release tablet Take 1 tablet (5 mg total) by mouth every 4 (four) hours as needed for severe pain. 30 tablet 0  . pantoprazole (PROTONIX) 40 MG tablet Take 1 tablet (40 mg total) by mouth daily. 30 tablet 1  . sacubitril-valsartan (ENTRESTO) 24-26 MG Take 1 tablet by mouth 2 (two) times daily. 180 tablet 3  . traZODone (DESYREL) 50 MG tablet Take 50 mg by mouth at bedtime.     [name] warfarin (COUMADIN) 5 MG tablet Take 1 tablet (5 mg total) by mouth daily at 6 PM. 40  tablet 3  . amiodarone (PACERONE) 200 MG tablet Take 1 tablet (200 mg total) by mouth 2 (two) times daily. 60 tablet 3   No current facility-administered medications for this encounter.    Vitals:   04/06/18 1019 04/06/18 1117  BP:  102/78  Pulse:  (!) 101  SpO2:  95%  Weight: 249 lb 12.8 oz (113.3 kg)    Wt Readings from Last 3 Encounters:  04/06/18 249 lb 12.8 oz (113.3 kg)  03/15/18 247 lb (112 kg)  03/07/18 240 lb (108.9 kg)   General:  Well appearing. No resp difficulty HEENT: normal Neck: supple. JVP 7-8  Carotids 2+ bilat; no bruits. No lymphadenopathy or thryomegaly appreciated. Cor: PMI nondisplaced. Tachy Regular rate & rhythm. No rubs, gallops or murmurs. Lungs: clear Abdomen: soft, nontender, nondistended. No hepatosplenomegaly. No bruits or masses. Good  bowel sounds. Extremities: no cyanosis, clubbing, rash, edema Neuro: alert & orientedx3, cranial nerves grossly intact. moves all 4 extremities w/o difficulty. Affect pleasant   EKG: Atrial Tach 103 bpm   Assessment/Plan: 1. Valvular heart disease: TEE in 9/18 showed severe MR, probably infarct-related with restrictive posterior mitral leaflet. There was moderate aortic stenosis and moderate-severe aortic insufficiency, possible rheumatic.  In 12/18, he had bioprosthetic MVR and AVR.  - Valves stable on 1/19 echo.  - Continue cardiac rehab.  2. Chronic combined CHF: In setting of significant valvular disease as above. EF down to 30-35% post-op (1/19 echo), likely reflects true LV systolic function without the volume load from severe MR and moderate-severe AI.  Mild volume overload on exam with weight up.  BP has been soft at cardiac rehab.  NYHA II. Volume status stable.  Continue current dose of lasix.    - Continue Coreg 6.25 mg BID - Continue Entresto 24/26 bid.  -Consider spiro at next appointment.  3. CKD: Stage 3. I reviewed BMET from 04/04/2018.   4. Atrial fibrillation:  Back in Atrial tach versus A  flutter. Unsuccessful at pacing out with Medtronic Rep. Start amio 200 mg twice a day.  - Set up for DC-CV next week.  - INR therapeutic on 4/1.   - He is on warfarin.  5. COPD: Mild obstruction on PFTs.  He has quit smoking.  Stable. No change.   6. CVA: Remote, minimal residual. S/p carotid stenting.  - No ASA given warfarin use.  7. Hyperlipidemia: Check lipids today.   8. Anemia: Fe deficiency.  FOBT negative 8/18.  Got feraheme in 8/18.   - CBC 9. 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  The artery to the PDA was occluded with left to right collaterals.  He had SVG-PDA in 12/18. No chest pain.  - Continue statin.   - No ASA given warfarin use.  10. CHB: has Medtronic PPM.  Minimal RV pacing now.   I personally contacted EP for device interrogation. Discussed with Chanetta Marshall and she evaluated him in the HF clinic. Today he is in  A Tach/A Flutter since 4/18//2019. Restart amio 200 mg twice a day. Set up for cardioversion for next week.    Greater than 50% of the (total minutes 25) visit spent in counseling/coordination of care regarding recurrent A flutter, medication changes and cardioversion.    Follow with Dr Aundra Dubin in 6 weeks with an EKG.  Tivon Lemoine NP-C  04/06/2018

## 2018-04-09 ENCOUNTER — Encounter (HOSPITAL_COMMUNITY): Payer: Medicare HMO

## 2018-04-09 ENCOUNTER — Ambulatory Visit (INDEPENDENT_AMBULATORY_CARE_PROVIDER_SITE_OTHER): Payer: Medicare HMO | Admitting: Pharmacist

## 2018-04-09 ENCOUNTER — Other Ambulatory Visit (HOSPITAL_COMMUNITY): Payer: Self-pay

## 2018-04-09 ENCOUNTER — Telehealth (HOSPITAL_COMMUNITY): Payer: Self-pay | Admitting: Family Medicine

## 2018-04-09 ENCOUNTER — Telehealth: Payer: Self-pay | Admitting: Pharmacist

## 2018-04-09 DIAGNOSIS — Z5181 Encounter for therapeutic drug level monitoring: Secondary | ICD-10-CM | POA: Diagnosis not present

## 2018-04-09 DIAGNOSIS — Z8679 Personal history of other diseases of the circulatory system: Secondary | ICD-10-CM

## 2018-04-09 DIAGNOSIS — Z9889 Other specified postprocedural states: Secondary | ICD-10-CM

## 2018-04-09 DIAGNOSIS — I480 Paroxysmal atrial fibrillation: Secondary | ICD-10-CM | POA: Diagnosis not present

## 2018-04-09 DIAGNOSIS — Z951 Presence of aortocoronary bypass graft: Secondary | ICD-10-CM

## 2018-04-09 DIAGNOSIS — Z953 Presence of xenogenic heart valve: Secondary | ICD-10-CM | POA: Diagnosis not present

## 2018-04-09 DIAGNOSIS — D509 Iron deficiency anemia, unspecified: Secondary | ICD-10-CM

## 2018-04-09 LAB — POCT INR: INR: 2.1

## 2018-04-09 NOTE — Patient Instructions (Signed)
Description   Take your normal 1.5 tablet dose today, take 1.5 tablets tomorrow, and recheck INR on Wednesday prior to cardioversion. Recheck INR in 1 week after cardioversion. Call with any new medications scheduled procedures or any questions  [phone]

## 2018-04-09 NOTE — Telephone Encounter (Signed)
Patient's wife called clinic after patient's INR check today. She was stressed out about patient's upcoming TEE and DCCV on Wednesday. She canceled patient's INR check that was scheduled prior to admission. Advised her that we will need to check his INR before he is admitted to ensure his level is high enough for DCCV. She stated she would call back to reschedule and hung up.

## 2018-04-11 ENCOUNTER — Ambulatory Visit (HOSPITAL_COMMUNITY): Payer: Medicare HMO | Admitting: Anesthesiology

## 2018-04-11 ENCOUNTER — Encounter (HOSPITAL_COMMUNITY)
Admission: RE | Disposition: A | Discharge status: Home / Self Care | Payer: Self-pay | Source: Ambulatory Visit | Attending: Cardiology

## 2018-04-11 ENCOUNTER — Encounter (HOSPITAL_COMMUNITY): Payer: Self-pay | Admitting: Anesthesiology

## 2018-04-11 ENCOUNTER — Ambulatory Visit (HOSPITAL_BASED_OUTPATIENT_CLINIC_OR_DEPARTMENT_OTHER)
Admit: 2018-04-11 | Discharge: 2018-04-11 | Disposition: A | Discharge status: Home / Self Care | Payer: Medicare HMO | Attending: Cardiology | Admitting: Cardiology

## 2018-04-11 ENCOUNTER — Ambulatory Visit (INDEPENDENT_AMBULATORY_CARE_PROVIDER_SITE_OTHER): Payer: Medicare HMO | Admitting: *Deleted

## 2018-04-11 ENCOUNTER — Ambulatory Visit (HOSPITAL_COMMUNITY)
Admission: RE | Admit: 2018-04-11 | Discharge: 2018-04-11 | Disposition: A | Discharge status: Home / Self Care | Payer: Medicare HMO | Source: Ambulatory Visit | Attending: Cardiology | Admitting: Cardiology

## 2018-04-11 ENCOUNTER — Encounter (HOSPITAL_COMMUNITY): Payer: Medicare HMO

## 2018-04-11 ENCOUNTER — Other Ambulatory Visit: Payer: Self-pay

## 2018-04-11 DIAGNOSIS — Z7901 Long term (current) use of anticoagulants: Secondary | ICD-10-CM | POA: Diagnosis not present

## 2018-04-11 DIAGNOSIS — I517 Cardiomegaly: Secondary | ICD-10-CM

## 2018-04-11 DIAGNOSIS — D509 Iron deficiency anemia, unspecified: Secondary | ICD-10-CM | POA: Diagnosis not present

## 2018-04-11 DIAGNOSIS — Z5181 Encounter for therapeutic drug level monitoring: Secondary | ICD-10-CM | POA: Diagnosis not present

## 2018-04-11 DIAGNOSIS — J449 Chronic obstructive pulmonary disease, unspecified: Secondary | ICD-10-CM | POA: Diagnosis not present

## 2018-04-11 DIAGNOSIS — Z8673 Personal history of transient ischemic attack (TIA), and cerebral infarction without residual deficits: Secondary | ICD-10-CM | POA: Insufficient documentation

## 2018-04-11 DIAGNOSIS — Z9889 Other specified postprocedural states: Secondary | ICD-10-CM | POA: Diagnosis not present

## 2018-04-11 DIAGNOSIS — M109 Gout, unspecified: Secondary | ICD-10-CM | POA: Diagnosis not present

## 2018-04-11 DIAGNOSIS — I059 Rheumatic mitral valve disease, unspecified: Secondary | ICD-10-CM | POA: Diagnosis not present

## 2018-04-11 DIAGNOSIS — E785 Hyperlipidemia, unspecified: Secondary | ICD-10-CM | POA: Diagnosis not present

## 2018-04-11 DIAGNOSIS — Z87891 Personal history of nicotine dependence: Secondary | ICD-10-CM | POA: Insufficient documentation

## 2018-04-11 DIAGNOSIS — K219 Gastro-esophageal reflux disease without esophagitis: Secondary | ICD-10-CM | POA: Insufficient documentation

## 2018-04-11 DIAGNOSIS — Z953 Presence of xenogenic heart valve: Secondary | ICD-10-CM | POA: Diagnosis not present

## 2018-04-11 DIAGNOSIS — I4892 Unspecified atrial flutter: Secondary | ICD-10-CM

## 2018-04-11 DIAGNOSIS — I480 Paroxysmal atrial fibrillation: Secondary | ICD-10-CM

## 2018-04-11 DIAGNOSIS — N183 Chronic kidney disease, stage 3 (moderate): Secondary | ICD-10-CM | POA: Insufficient documentation

## 2018-04-11 DIAGNOSIS — I2510 Atherosclerotic heart disease of native coronary artery without angina pectoris: Secondary | ICD-10-CM | POA: Insufficient documentation

## 2018-04-11 DIAGNOSIS — I5032 Chronic diastolic (congestive) heart failure: Secondary | ICD-10-CM | POA: Diagnosis not present

## 2018-04-11 DIAGNOSIS — I130 Hypertensive heart and chronic kidney disease with heart failure and stage 1 through stage 4 chronic kidney disease, or unspecified chronic kidney disease: Secondary | ICD-10-CM | POA: Diagnosis not present

## 2018-04-11 DIAGNOSIS — Z8249 Family history of ischemic heart disease and other diseases of the circulatory system: Secondary | ICD-10-CM | POA: Insufficient documentation

## 2018-04-11 DIAGNOSIS — Z823 Family history of stroke: Secondary | ICD-10-CM | POA: Diagnosis not present

## 2018-04-11 DIAGNOSIS — Z8679 Personal history of other diseases of the circulatory system: Secondary | ICD-10-CM | POA: Diagnosis not present

## 2018-04-11 DIAGNOSIS — Z79899 Other long term (current) drug therapy: Secondary | ICD-10-CM | POA: Insufficient documentation

## 2018-04-11 DIAGNOSIS — Z951 Presence of aortocoronary bypass graft: Secondary | ICD-10-CM

## 2018-04-11 DIAGNOSIS — H9190 Unspecified hearing loss, unspecified ear: Secondary | ICD-10-CM | POA: Diagnosis not present

## 2018-04-11 DIAGNOSIS — I129 Hypertensive chronic kidney disease with stage 1 through stage 4 chronic kidney disease, or unspecified chronic kidney disease: Secondary | ICD-10-CM | POA: Diagnosis not present

## 2018-04-11 HISTORY — PX: CARDIOVERSION: SHX1299

## 2018-04-11 HISTORY — PX: TEE WITHOUT CARDIOVERSION: SHX5443

## 2018-04-11 LAB — POCT I-STAT, CHEM 8
BUN: 31 mg/dL — ABNORMAL HIGH (ref 6–20)
CALCIUM ION: 1.19 mmol/L (ref 1.15–1.40)
CREATININE: 1.6 mg/dL — AB (ref 0.61–1.24)
Chloride: 107 mmol/L (ref 101–111)
GLUCOSE: 111 mg/dL — AB (ref 65–99)
HCT: 37 % — ABNORMAL LOW (ref 39.0–52.0)
HEMOGLOBIN: 12.6 g/dL — AB (ref 13.0–17.0)
Potassium: 4.3 mmol/L (ref 3.5–5.1)
Sodium: 143 mmol/L (ref 135–145)
TCO2: 24 mmol/L (ref 22–32)

## 2018-04-11 LAB — POCT INR: INR: 2.9

## 2018-04-11 SURGERY — ECHOCARDIOGRAM, TRANSESOPHAGEAL
Anesthesia: General

## 2018-04-11 MED ORDER — HYDROCORTISONE 1 % EX CREA: 1.0000 "application " | TOPICAL_CREAM | Freq: Three times a day (TID) | CUTANEOUS | Status: DC | PRN

## 2018-04-11 MED ORDER — PHENYLEPHRINE HCL 10 MG/ML IJ SOLN
INTRAMUSCULAR | Status: DC | PRN
  Administered 2018-04-11: 120 ug via INTRAVENOUS
  Administered 2018-04-11: 80 ug via INTRAVENOUS
  Administered 2018-04-11: 40 ug via INTRAVENOUS
  Administered 2018-04-11 (×2): 80 ug via INTRAVENOUS

## 2018-04-11 MED ORDER — SODIUM CHLORIDE 0.9% FLUSH: 3.0000 mL | INTRAVENOUS | Status: DC | PRN

## 2018-04-11 MED ORDER — SODIUM CHLORIDE 0.9% FLUSH: 3.0000 mL | Freq: Two times a day (BID) | INTRAVENOUS | Status: DC

## 2018-04-11 MED ORDER — AMIODARONE HCL 200 MG PO TABS: 200.0000 mg | ORAL_TABLET | Freq: Every day | ORAL | 3 refills | Status: DC

## 2018-04-11 MED ORDER — EPHEDRINE SULFATE 50 MG/ML IJ SOLN
INTRAMUSCULAR | Status: DC | PRN
  Administered 2018-04-11: 10 mg via INTRAVENOUS

## 2018-04-11 MED ORDER — SODIUM CHLORIDE 0.9 % IV SOLN
INTRAVENOUS | Status: DC
  Administered 2018-04-11 (×2): via INTRAVENOUS

## 2018-04-11 MED ORDER — DEXMEDETOMIDINE HCL 200 MCG/2ML IV SOLN
INTRAVENOUS | Status: DC | PRN
  Administered 2018-04-11 (×5): 8 ug via INTRAVENOUS

## 2018-04-11 MED ORDER — SODIUM CHLORIDE 0.9 % IV SOLN: 250.0000 mL | INTRAVENOUS | Status: DC

## 2018-04-11 MED ORDER — PROPOFOL 500 MG/50ML IV EMUL
INTRAVENOUS | Status: DC | PRN
  Administered 2018-04-11: 100 ug/kg/min via INTRAVENOUS

## 2018-04-11 NOTE — Transfer of Care (Signed)
Immediate Anesthesia Transfer of Care Note  Patient: Brandon [name]  Procedure(s) Performed: TRANSESOPHAGEAL ECHOCARDIOGRAM (TEE) (N/A ) CARDIOVERSION (N/A )  Patient Location: Endoscopy Unit  Anesthesia Type:MAC  Level of Consciousness: awake, alert  and oriented  Airway & Oxygen Therapy: Patient Spontanous Breathing and Patient connected to nasal cannula oxygen  Post-op Assessment: Report given to RN, Post -op Vital signs reviewed and stable and Patient moving all extremities X 4  Post vital signs: Reviewed and stable  Last Vitals:  Vitals Value Taken Time  BP 87/49 04/11/2018 12:40 PM  Temp    Pulse 64 04/11/2018 12:43 PM  Resp 16 04/11/2018 12:43 PM  SpO2 92 % 04/11/2018 12:43 PM  Vitals shown include unvalidated device data.  Last Pain:  Vitals:   04/11/18 1235  TempSrc:   PainSc: 0-No pain         Complications: No apparent anesthesia complications

## 2018-04-11 NOTE — Procedures (Signed)
Electrical Cardioversion Procedure Note Brandon [name] [id number] Jul 14, 1946  Procedure: Electrical Cardioversion Indications:  Atrial Flutter  Procedure Details Consent: Risks of procedure as well as the alternatives and risks of each were explained to the (patient/caregiver).  Consent for procedure obtained. Time Out: Verified patient identification, verified procedure, site/side was marked, verified correct patient position, special equipment/implants available, medications/allergies/relevent history reviewed, required imaging and test results available.  Performed  Patient placed on cardiac monitor, pulse oximetry, supplemental oxygen as necessary.  Sedation given: Per anesthesiology Pacer pads placed anterior and posterior chest.  Cardioverted 1 time(s).  Cardioverted at 150J.  Evaluation Findings: Post procedure EKG shows: NSR Complications: None Patient did tolerate procedure well.  He can decrease amiodarone to 200 mg daily.    Loralie Champagne 04/11/2018, 12:34 PM

## 2018-04-11 NOTE — Anesthesia Procedure Notes (Signed)
Procedure Name: MAC Date/Time: 04/11/2018 12:04 PM Performed by: Mariea Clonts, CRNA Pre-anesthesia Checklist: Patient identified, Emergency Drugs available, Suction available, Patient being monitored and Timeout performed Patient Re-evaluated:Patient Re-evaluated prior to induction Oxygen Delivery Method: Simple face mask

## 2018-04-11 NOTE — Interval H&P Note (Signed)
History and Physical Interval Note:  04/11/2018 12:06 PM  Brandon [name]  has presented today for surgery, with the diagnosis of AFLUTTER  The various methods of treatment have been discussed with the patient and family. After consideration of risks, benefits and other options for treatment, the patient has consented to  Procedure(s): TRANSESOPHAGEAL ECHOCARDIOGRAM (TEE) (N/A) CARDIOVERSION (N/A) as a surgical intervention .  The patient's history has been reviewed, patient examined, no change in status, stable for surgery.  I have reviewed the patient's chart and labs.  Questions were answered to the patient's satisfaction.     [name]

## 2018-04-11 NOTE — CV Procedure (Signed)
Procedure: TEE  Indication: Atrial flutter  Sedation: Per anesthesiology  Findings: Please see echo section for full report.  Normal left ventricular size and wall thickness.  EF 25-30%, diffuse hypokinesis.  Normal RV size with mildly decreased systolic function. PPM in RV. Moderate left atrial enlargement, the LA appendage appears to have been clipped/excluded.  No thrombus in the left atrium.  Mildly dilated RA.  No PFO/ASD by color doppler.  Mild TR.  Bioprosthetic mitral valve with no significant stenosis (mean gradient 3 mmHg), no significant regurgitation.  Bioprosthetic aortic valve with trivial AI, no significant stenosis.  Normal caliber thoracic aorta with mild plaque in descending thoracic aorta.   May proceed to DCCV.   Loralie Champagne 04/11/2018 12:34 PM

## 2018-04-11 NOTE — Anesthesia Preprocedure Evaluation (Signed)
Anesthesia Evaluation  Patient identified by MRN, date of birth, ID band Patient awake    Reviewed: Allergy & Precautions, NPO status , Patient's Chart, lab work & pertinent test results, reviewed documented beta blocker date and time   Airway Mallampati: II  TM Distance: >3 FB Neck ROM: Full    Dental  (+) Partial Lower, Poor Dentition   Pulmonary shortness of breath and with exertion, sleep apnea , COPD,  COPD inhaler, former smoker,    Pulmonary exam normal breath sounds clear to auscultation + decreased breath sounds      Cardiovascular hypertension, Pt. on medications and Pt. on home beta blockers + CAD, + Past MI, + CABG, + Peripheral Vascular Disease and +CHF  + dysrhythmias Atrial Fibrillation + Valvular Problems/Murmurs AS and MR  Rhythm:Irregular Rate:Normal  S/P AVR, MVR, Maze and CABG 1v  11/2017   Neuro/Psych PSYCHIATRIC DISORDERS Anxiety Depression Dysphagia CVA, Residual Symptoms    GI/Hepatic Neg liver ROS, GERD  Medicated and Controlled,  Endo/Other  Hyperlipidemia Obesity  Renal/GU Renal InsufficiencyRenal disease     Musculoskeletal  (+) Arthritis , Osteoarthritis,    Abdominal (+) + obese,   Peds  Hematology  (+) anemia , Coumadin therapy- last dose 4/30 pm   Anesthesia Other Findings   Reproductive/Obstetrics                             Anesthesia Physical Anesthesia Plan  ASA: III  Anesthesia Plan: General   Post-op Pain Management:    Induction: Intravenous  PONV Risk Score and Plan: 2 and Propofol infusion, Ondansetron and Treatment may vary due to age or medical condition  Airway Management Planned: Mask  Additional Equipment:   Intra-op Plan:   Post-operative Plan:   Informed Consent: I have reviewed the patients History and Physical, chart, labs and discussed the procedure including the risks, benefits and alternatives for the proposed anesthesia  with the patient or authorized representative who has indicated his/her understanding and acceptance.   Dental advisory given  Plan Discussed with: CRNA, Anesthesiologist and Surgeon  Anesthesia Plan Comments:         Anesthesia Quick Evaluation

## 2018-04-11 NOTE — Discharge Instructions (Signed)
TEE ° °YOU HAD AN CARDIAC PROCEDURE TODAY: Refer to the procedure report and other information in the discharge instructions given to you for any specific questions about what was found during the examination. If this information does not answer your questions, please call [facility] office at [phone] to clarify.  ° °DIET: Your first meal following the procedure should be a light meal and then it is ok to progress to your normal diet. A half-sandwich or bowl of soup is an example of a good first meal. Heavy or fried foods are harder to digest and may make you feel nauseous or bloated. Drink plenty of fluids but you should avoid alcoholic beverages for 24 hours. If you had a esophageal dilation, please see attached instructions for diet.  ° °ACTIVITY: Your care partner should take you home directly after the procedure. You should plan to take it easy, moving slowly for the rest of the day. You can resume normal activity the day after the procedure however YOU SHOULD NOT DRIVE, use power tools, machinery or perform tasks that involve climbing or major physical exertion for 24 hours (because of the sedation medicines used during the test).  ° °SYMPTOMS TO REPORT IMMEDIATELY: °A cardiologist can be reached at any hour. Please call [phone] for any of the following symptoms:  °Vomiting of blood or coffee ground material  °New, significant abdominal pain  °New, significant chest pain or pain under the shoulder blades  °Painful or persistently difficult swallowing  °New shortness of breath  °Black, tarry-looking or red, bloody stools ° °FOLLOW UP:  °Please also call with any specific questions about appointments or follow up tests. ° °Electrical Cardioversion, Care After °This sheet gives you information about how to care for yourself after your procedure. Your health care provider may also give you more specific instructions. If you have problems or questions, contact your health care provider. °What can I  expect after the procedure? °After the procedure, it is common to have: °· Some redness on the skin where the shocks were given. ° °Follow these instructions at home: °· Do not drive for 24 hours if you were given a medicine to help you relax (sedative). °· Take over-the-counter and prescription medicines only as told by your health care provider. °· Ask your health care provider how to check your pulse. Check it often. °· Rest for 48 hours after the procedure or as told by your health care provider. °· Avoid or limit your caffeine use as told by your health care provider. °Contact a health care provider if: °· You feel like your heart is beating too quickly or your pulse is not regular. °· You have a serious muscle cramp that does not go away. °Get help right away if: °· You have discomfort in your chest. °· You are dizzy or you feel faint. °· You have trouble breathing or you are short of breath. °· Your speech is slurred. °· You have trouble moving an arm or leg on one side of your body. °· Your fingers or toes turn cold or blue. °This information is not intended to replace advice given to you by your health care provider. Make sure you discuss any questions you have with your health care provider. °Document Released: 09/18/2013 Document Revised: 07/01/2016 Document Reviewed: 06/03/2016 °Elsevier Interactive Patient Education © 2018 [employer] ° °

## 2018-04-11 NOTE — Anesthesia Postprocedure Evaluation (Signed)
Anesthesia Post Note  Patient: Brandon [name]  Procedure(s) Performed: TRANSESOPHAGEAL ECHOCARDIOGRAM (TEE) (N/A ) CARDIOVERSION (N/A )     Patient location during evaluation: PACU Anesthesia Type: General Level of consciousness: awake and alert and oriented Pain management: pain level controlled Vital Signs Assessment: post-procedure vital signs reviewed and stable Respiratory status: spontaneous breathing, nonlabored ventilation and respiratory function stable Cardiovascular status: blood pressure returned to baseline and stable Postop Assessment: no apparent nausea or vomiting Anesthetic complications: no    Last Vitals:  Vitals:   04/11/18 1330 04/11/18 1340  BP: (!) 84/54 (!) 102/54  Pulse: 64 66  Resp: 16 12  Temp:    SpO2: 94% 97%    Last Pain:  Vitals:   04/11/18 1340  TempSrc:   PainSc: 0-No pain                 Karlie Aung A.

## 2018-04-11 NOTE — Patient Instructions (Signed)
Description   Continue taking 1 tablet daily except 1.5 tablets on Mondays.  Recheck INR in 1 week post  Cardioversion/TEE.  Call with any new medications scheduled procedures or any questions  [phone]

## 2018-04-13 ENCOUNTER — Encounter (HOSPITAL_COMMUNITY)
Admission: RE | Admit: 2018-04-13 | Discharge: 2018-04-13 | Disposition: A | Discharge status: Home / Self Care | Payer: Medicare HMO | Source: Ambulatory Visit | Attending: Internal Medicine | Admitting: Internal Medicine

## 2018-04-13 DIAGNOSIS — Z79899 Other long term (current) drug therapy: Secondary | ICD-10-CM | POA: Insufficient documentation

## 2018-04-13 DIAGNOSIS — K219 Gastro-esophageal reflux disease without esophagitis: Secondary | ICD-10-CM | POA: Insufficient documentation

## 2018-04-13 DIAGNOSIS — Z8673 Personal history of transient ischemic attack (TIA), and cerebral infarction without residual deficits: Secondary | ICD-10-CM | POA: Diagnosis not present

## 2018-04-13 DIAGNOSIS — Z953 Presence of xenogenic heart valve: Secondary | ICD-10-CM | POA: Insufficient documentation

## 2018-04-13 DIAGNOSIS — Z951 Presence of aortocoronary bypass graft: Secondary | ICD-10-CM | POA: Diagnosis not present

## 2018-04-13 DIAGNOSIS — Z952 Presence of prosthetic heart valve: Secondary | ICD-10-CM

## 2018-04-13 DIAGNOSIS — Z79891 Long term (current) use of opiate analgesic: Secondary | ICD-10-CM | POA: Insufficient documentation

## 2018-04-13 DIAGNOSIS — J449 Chronic obstructive pulmonary disease, unspecified: Secondary | ICD-10-CM | POA: Insufficient documentation

## 2018-04-13 DIAGNOSIS — Z87891 Personal history of nicotine dependence: Secondary | ICD-10-CM | POA: Insufficient documentation

## 2018-04-13 DIAGNOSIS — Z9889 Other specified postprocedural states: Secondary | ICD-10-CM | POA: Insufficient documentation

## 2018-04-13 NOTE — Progress Notes (Signed)
Patient returned to exercise per Dr Aundra Dubin.  Patient exercised without complaints.Will continue to monitor the patient throughout  the program. Telemetry rhythm Atrial paced rhythm. Barnet Pall, RN,BSN 04/13/2018 12:08 PM

## 2018-04-16 ENCOUNTER — Encounter (HOSPITAL_COMMUNITY)
Admission: RE | Admit: 2018-04-16 | Discharge: 2018-04-16 | Disposition: A | Discharge status: Home / Self Care | Payer: Medicare HMO | Source: Ambulatory Visit | Attending: Internal Medicine | Admitting: Internal Medicine

## 2018-04-16 DIAGNOSIS — Z952 Presence of prosthetic heart valve: Secondary | ICD-10-CM

## 2018-04-16 DIAGNOSIS — Z9889 Other specified postprocedural states: Secondary | ICD-10-CM | POA: Diagnosis not present

## 2018-04-16 DIAGNOSIS — J449 Chronic obstructive pulmonary disease, unspecified: Secondary | ICD-10-CM | POA: Diagnosis not present

## 2018-04-16 DIAGNOSIS — Z951 Presence of aortocoronary bypass graft: Secondary | ICD-10-CM | POA: Diagnosis not present

## 2018-04-16 DIAGNOSIS — Z8673 Personal history of transient ischemic attack (TIA), and cerebral infarction without residual deficits: Secondary | ICD-10-CM | POA: Diagnosis not present

## 2018-04-16 DIAGNOSIS — K219 Gastro-esophageal reflux disease without esophagitis: Secondary | ICD-10-CM | POA: Diagnosis not present

## 2018-04-16 DIAGNOSIS — Z79899 Other long term (current) drug therapy: Secondary | ICD-10-CM | POA: Diagnosis not present

## 2018-04-16 DIAGNOSIS — Z79891 Long term (current) use of opiate analgesic: Secondary | ICD-10-CM | POA: Diagnosis not present

## 2018-04-16 DIAGNOSIS — Z87891 Personal history of nicotine dependence: Secondary | ICD-10-CM | POA: Diagnosis not present

## 2018-04-16 DIAGNOSIS — Z953 Presence of xenogenic heart valve: Secondary | ICD-10-CM | POA: Diagnosis not present

## 2018-04-16 DIAGNOSIS — Z950 Presence of cardiac pacemaker: Secondary | ICD-10-CM

## 2018-04-18 ENCOUNTER — Ambulatory Visit (INDEPENDENT_AMBULATORY_CARE_PROVIDER_SITE_OTHER): Payer: Medicare HMO | Admitting: *Deleted

## 2018-04-18 ENCOUNTER — Encounter (HOSPITAL_COMMUNITY)
Admission: RE | Admit: 2018-04-18 | Discharge: 2018-04-18 | Disposition: A | Discharge status: Home / Self Care | Payer: Medicare HMO | Source: Ambulatory Visit | Attending: Internal Medicine | Admitting: Internal Medicine

## 2018-04-18 DIAGNOSIS — Z5181 Encounter for therapeutic drug level monitoring: Secondary | ICD-10-CM | POA: Diagnosis not present

## 2018-04-18 DIAGNOSIS — Z79899 Other long term (current) drug therapy: Secondary | ICD-10-CM | POA: Diagnosis not present

## 2018-04-18 DIAGNOSIS — I480 Paroxysmal atrial fibrillation: Secondary | ICD-10-CM | POA: Diagnosis not present

## 2018-04-18 DIAGNOSIS — K219 Gastro-esophageal reflux disease without esophagitis: Secondary | ICD-10-CM | POA: Diagnosis not present

## 2018-04-18 DIAGNOSIS — Z87891 Personal history of nicotine dependence: Secondary | ICD-10-CM | POA: Diagnosis not present

## 2018-04-18 DIAGNOSIS — Z953 Presence of xenogenic heart valve: Secondary | ICD-10-CM

## 2018-04-18 DIAGNOSIS — Z9889 Other specified postprocedural states: Secondary | ICD-10-CM | POA: Diagnosis not present

## 2018-04-18 DIAGNOSIS — Z952 Presence of prosthetic heart valve: Secondary | ICD-10-CM

## 2018-04-18 DIAGNOSIS — Z951 Presence of aortocoronary bypass graft: Secondary | ICD-10-CM

## 2018-04-18 DIAGNOSIS — J449 Chronic obstructive pulmonary disease, unspecified: Secondary | ICD-10-CM | POA: Diagnosis not present

## 2018-04-18 DIAGNOSIS — Z79891 Long term (current) use of opiate analgesic: Secondary | ICD-10-CM | POA: Diagnosis not present

## 2018-04-18 DIAGNOSIS — Z8673 Personal history of transient ischemic attack (TIA), and cerebral infarction without residual deficits: Secondary | ICD-10-CM | POA: Diagnosis not present

## 2018-04-18 DIAGNOSIS — Z8679 Personal history of other diseases of the circulatory system: Secondary | ICD-10-CM | POA: Diagnosis not present

## 2018-04-18 LAB — POCT INR: INR: 3

## 2018-04-18 IMAGING — CT CT HEAD W/O CM
3 of 5 series · 14 of 47 positions shown, 16 images · non-contrast
Comparison: 07/20/2017

CLINICAL DATA: Stroke follow-up

EXAM:
CT HEAD WITHOUT CONTRAST
TECHNIQUE: Contiguous axial images were obtained from the base of the skull
through the vertex without intravenous contrast.

[Series 3: head without · axial · non-contrast · 0.44mm/px · z∈[-109,-39]mm · 3 of 36 slices shown]
[im 8/36  brain]
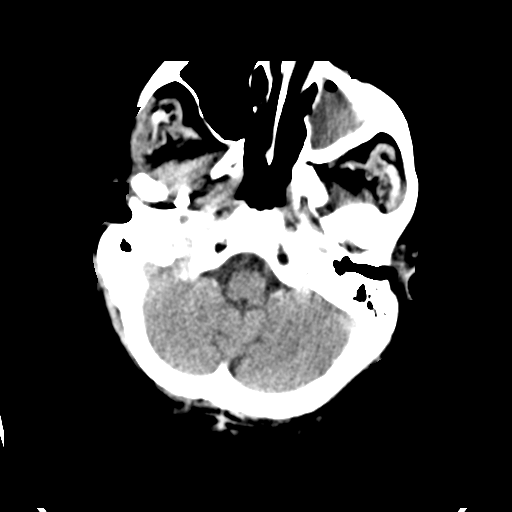
[im 15/36  brain]
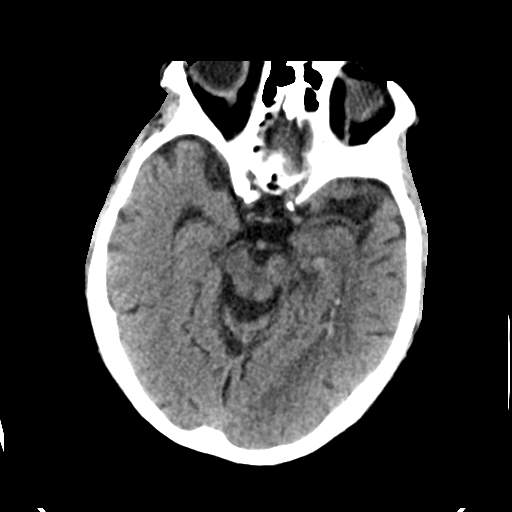
[im 22/36  brain]
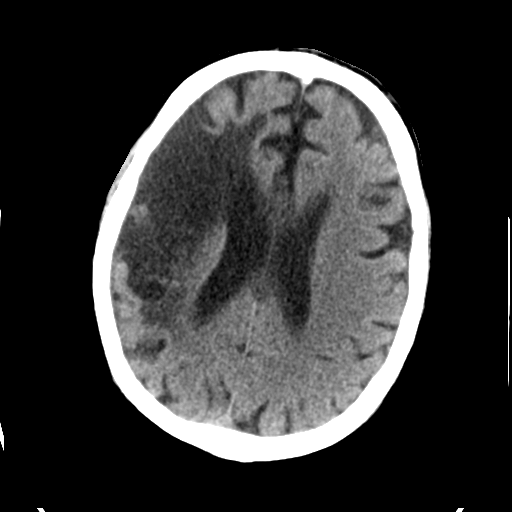

[Series 5: head without cor · coronal · non-contrast · 0.31mm/px · 3 of 70 slices shown]
[im 24/70  brain]
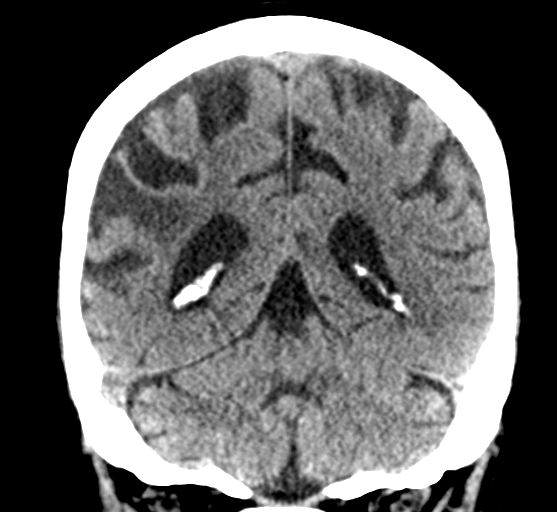
[im 31/70  brain]
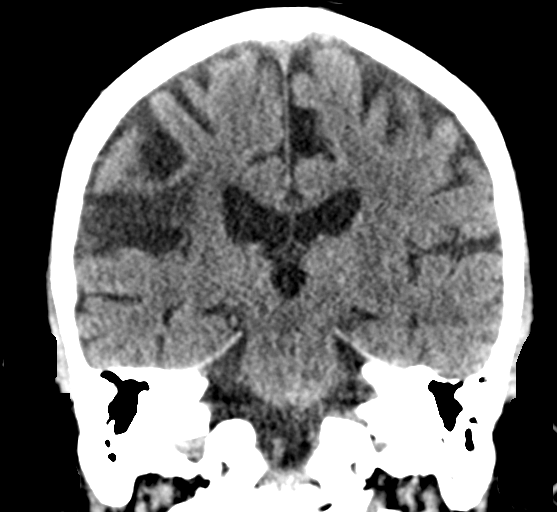
[im 39/70  brain]
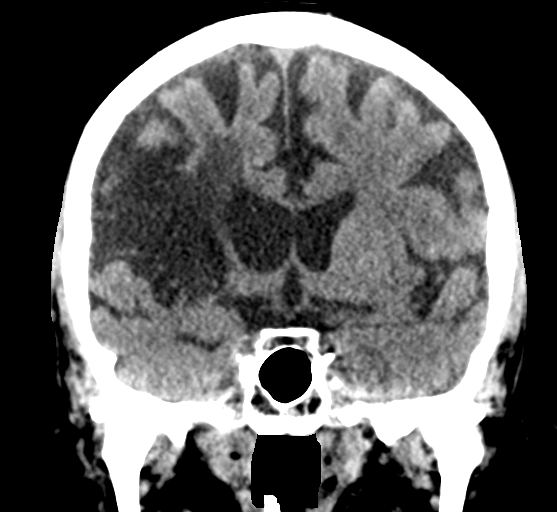

[Series 7: head without ax · axial · non-contrast · 0.35mm/px · z∈[-98,+42]mm · 8 of 62 slices shown, 10 images]
[im 7/62  brain]
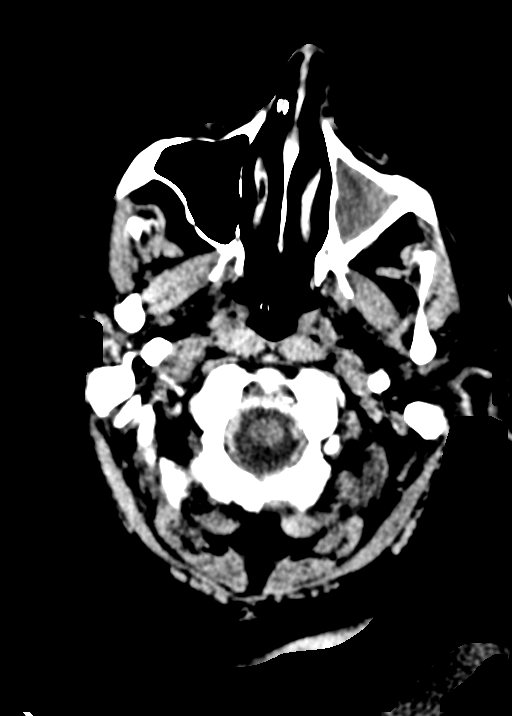
[im 7/62  bone]
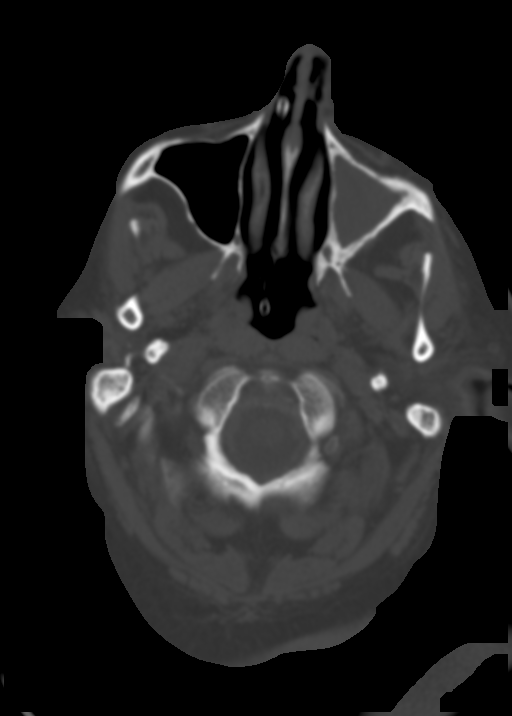
[im 14/62  brain]
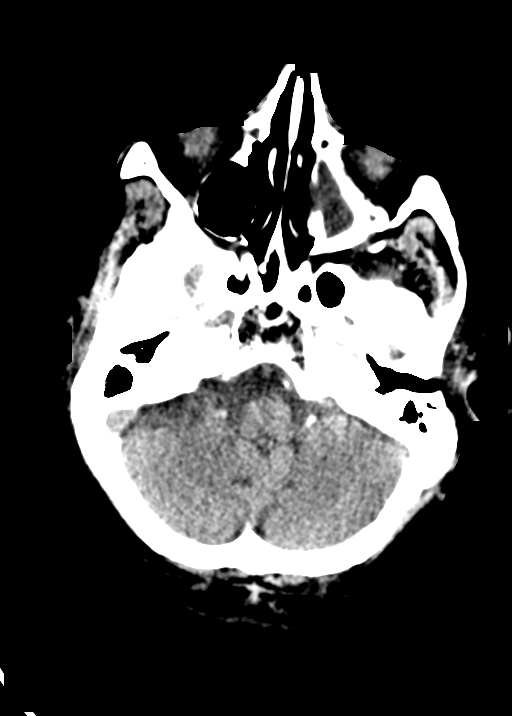
[im 21/62  brain]
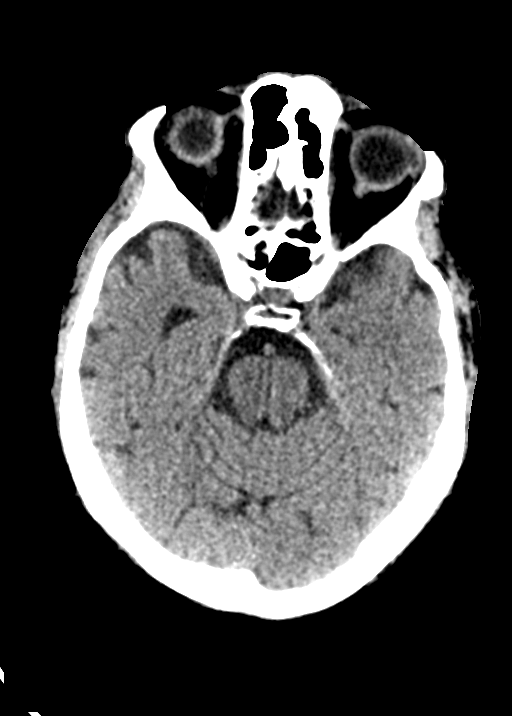
[im 28/62  brain]
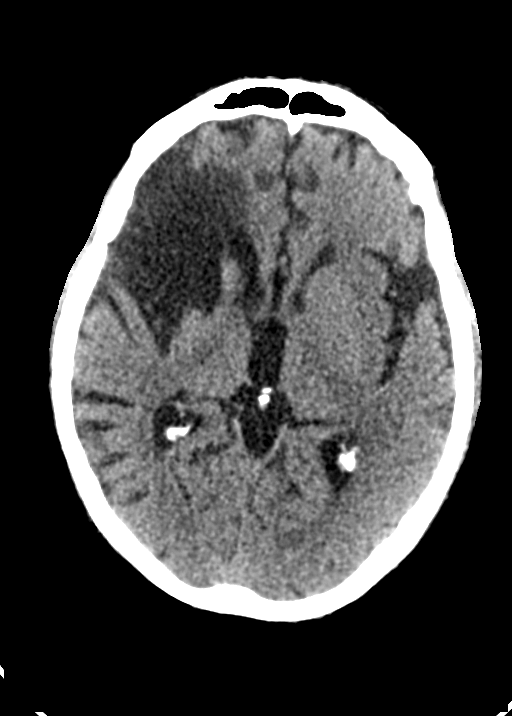
[im 34/62  brain]
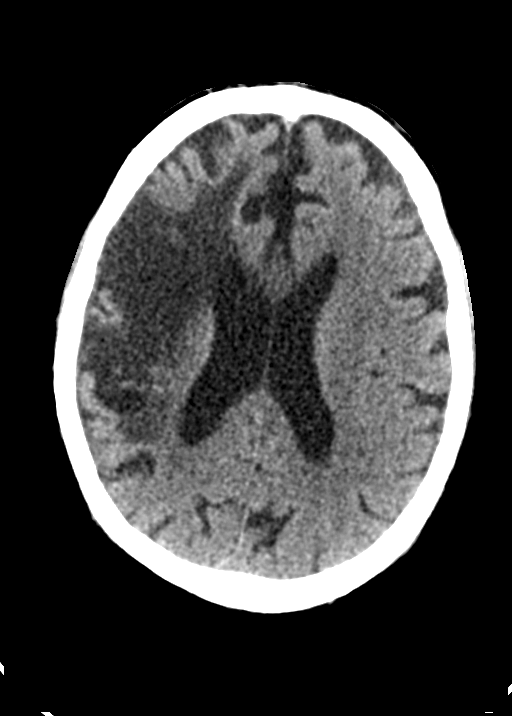
[im 34/62  bone]
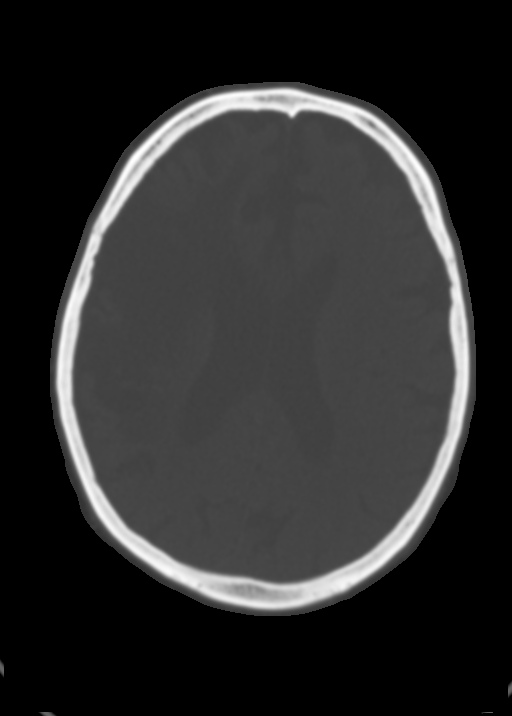
[im 41/62  brain]
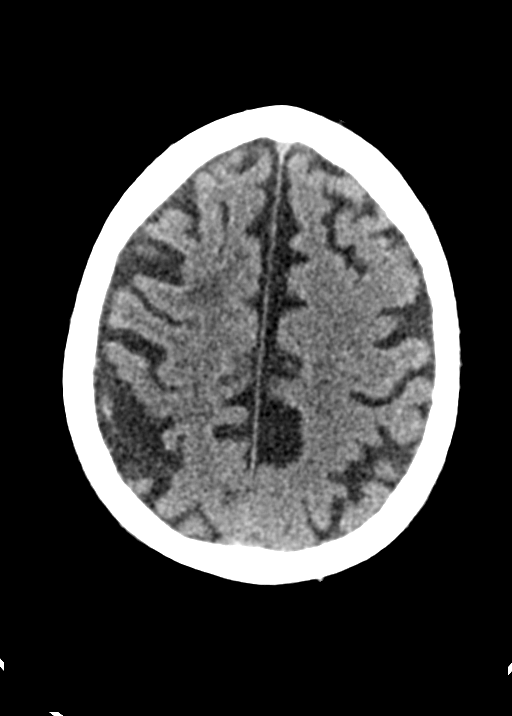
[im 48/62  brain]
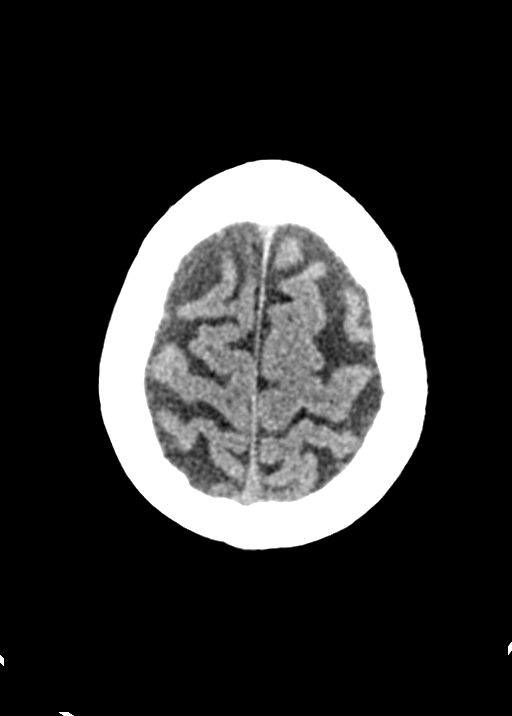
[im 55/62  brain]
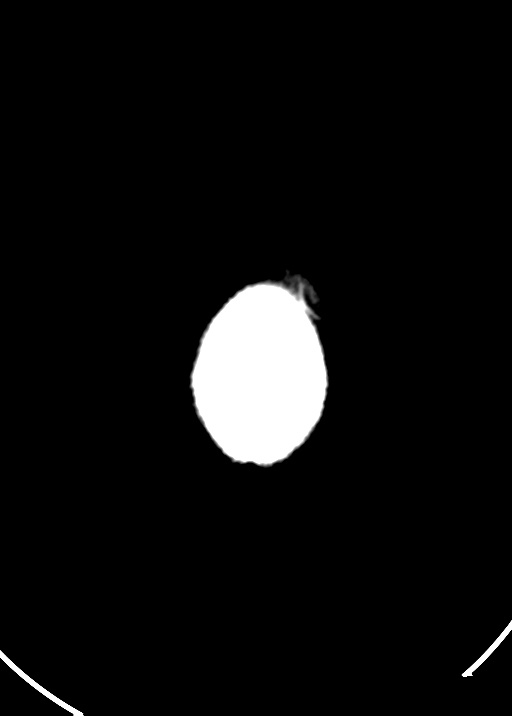

[14 of 47 positions shown; findings below may reference images not displayed]

FINDINGS: Brain: No evidence of acute infarction, hemorrhage, extra-axial
collection, ventriculomegaly, or mass effect. Old large right MCA
territory infarct with severe encephalomalacia. Generalized cerebral
atrophy. Periventricular white matter low attenuation likely
secondary to microangiopathy.

Vascular: Cerebrovascular atherosclerotic calcifications are noted.

Skull: Negative for fracture or focal lesion.

Sinuses/Orbits: Visualized portions of the orbits are unremarkable.
Visualized portions of the paranasal sinuses and mastoid air cells
are unremarkable.

Other: None.
IMPRESSION: No acute intracranial pathology.

## 2018-04-18 NOTE — Patient Instructions (Signed)
Description   Today take 1/2 tablet then continue taking 1 tablet daily except 1.5 tablets on Mondays.  Recheck INR in 2 weeks.  Call with any new medications scheduled procedures or any questions  [phone]

## 2018-04-20 ENCOUNTER — Encounter (HOSPITAL_COMMUNITY)
Admission: RE | Admit: 2018-04-20 | Discharge: 2018-04-20 | Disposition: A | Discharge status: Home / Self Care | Payer: Medicare HMO | Source: Ambulatory Visit | Attending: Internal Medicine | Admitting: Internal Medicine

## 2018-04-20 DIAGNOSIS — Z951 Presence of aortocoronary bypass graft: Secondary | ICD-10-CM

## 2018-04-20 DIAGNOSIS — Z950 Presence of cardiac pacemaker: Secondary | ICD-10-CM

## 2018-04-20 DIAGNOSIS — Z79899 Other long term (current) drug therapy: Secondary | ICD-10-CM | POA: Diagnosis not present

## 2018-04-20 DIAGNOSIS — Z9889 Other specified postprocedural states: Secondary | ICD-10-CM | POA: Diagnosis not present

## 2018-04-20 DIAGNOSIS — Z953 Presence of xenogenic heart valve: Secondary | ICD-10-CM | POA: Diagnosis not present

## 2018-04-20 DIAGNOSIS — Z952 Presence of prosthetic heart valve: Secondary | ICD-10-CM

## 2018-04-20 DIAGNOSIS — Z79891 Long term (current) use of opiate analgesic: Secondary | ICD-10-CM | POA: Diagnosis not present

## 2018-04-20 DIAGNOSIS — Z8673 Personal history of transient ischemic attack (TIA), and cerebral infarction without residual deficits: Secondary | ICD-10-CM | POA: Diagnosis not present

## 2018-04-20 DIAGNOSIS — J449 Chronic obstructive pulmonary disease, unspecified: Secondary | ICD-10-CM | POA: Diagnosis not present

## 2018-04-20 DIAGNOSIS — Z87891 Personal history of nicotine dependence: Secondary | ICD-10-CM | POA: Diagnosis not present

## 2018-04-20 DIAGNOSIS — K219 Gastro-esophageal reflux disease without esophagitis: Secondary | ICD-10-CM | POA: Diagnosis not present

## 2018-04-23 ENCOUNTER — Encounter (HOSPITAL_COMMUNITY)
Admission: RE | Admit: 2018-04-23 | Discharge: 2018-04-23 | Disposition: A | Discharge status: Home / Self Care | Payer: Medicare HMO | Source: Ambulatory Visit | Attending: Internal Medicine | Admitting: Internal Medicine

## 2018-04-23 DIAGNOSIS — Z951 Presence of aortocoronary bypass graft: Secondary | ICD-10-CM

## 2018-04-23 DIAGNOSIS — J449 Chronic obstructive pulmonary disease, unspecified: Secondary | ICD-10-CM | POA: Diagnosis not present

## 2018-04-23 DIAGNOSIS — Z79899 Other long term (current) drug therapy: Secondary | ICD-10-CM | POA: Diagnosis not present

## 2018-04-23 DIAGNOSIS — Z79891 Long term (current) use of opiate analgesic: Secondary | ICD-10-CM | POA: Diagnosis not present

## 2018-04-23 DIAGNOSIS — Z8673 Personal history of transient ischemic attack (TIA), and cerebral infarction without residual deficits: Secondary | ICD-10-CM | POA: Diagnosis not present

## 2018-04-23 DIAGNOSIS — Z9889 Other specified postprocedural states: Secondary | ICD-10-CM | POA: Diagnosis not present

## 2018-04-23 DIAGNOSIS — Z87891 Personal history of nicotine dependence: Secondary | ICD-10-CM | POA: Diagnosis not present

## 2018-04-23 DIAGNOSIS — Z953 Presence of xenogenic heart valve: Secondary | ICD-10-CM | POA: Diagnosis not present

## 2018-04-23 DIAGNOSIS — Z952 Presence of prosthetic heart valve: Secondary | ICD-10-CM

## 2018-04-23 DIAGNOSIS — K219 Gastro-esophageal reflux disease without esophagitis: Secondary | ICD-10-CM | POA: Diagnosis not present

## 2018-04-25 ENCOUNTER — Encounter (HOSPITAL_COMMUNITY)
Admission: RE | Admit: 2018-04-25 | Discharge: 2018-04-25 | Disposition: A | Discharge status: Home / Self Care | Payer: Medicare HMO | Source: Ambulatory Visit | Attending: Internal Medicine | Admitting: Internal Medicine

## 2018-04-25 DIAGNOSIS — Z952 Presence of prosthetic heart valve: Secondary | ICD-10-CM

## 2018-04-25 DIAGNOSIS — Z951 Presence of aortocoronary bypass graft: Secondary | ICD-10-CM | POA: Diagnosis not present

## 2018-04-25 DIAGNOSIS — Z953 Presence of xenogenic heart valve: Secondary | ICD-10-CM | POA: Diagnosis not present

## 2018-04-25 DIAGNOSIS — Z950 Presence of cardiac pacemaker: Secondary | ICD-10-CM

## 2018-04-25 DIAGNOSIS — Z79899 Other long term (current) drug therapy: Secondary | ICD-10-CM | POA: Diagnosis not present

## 2018-04-25 DIAGNOSIS — J449 Chronic obstructive pulmonary disease, unspecified: Secondary | ICD-10-CM | POA: Diagnosis not present

## 2018-04-25 DIAGNOSIS — Z8673 Personal history of transient ischemic attack (TIA), and cerebral infarction without residual deficits: Secondary | ICD-10-CM | POA: Diagnosis not present

## 2018-04-25 DIAGNOSIS — K219 Gastro-esophageal reflux disease without esophagitis: Secondary | ICD-10-CM | POA: Diagnosis not present

## 2018-04-25 DIAGNOSIS — Z9889 Other specified postprocedural states: Secondary | ICD-10-CM | POA: Diagnosis not present

## 2018-04-25 DIAGNOSIS — Z87891 Personal history of nicotine dependence: Secondary | ICD-10-CM | POA: Diagnosis not present

## 2018-04-25 DIAGNOSIS — Z79891 Long term (current) use of opiate analgesic: Secondary | ICD-10-CM | POA: Diagnosis not present

## 2018-04-26 ENCOUNTER — Encounter (HOSPITAL_COMMUNITY): Payer: Self-pay

## 2018-04-26 NOTE — Progress Notes (Signed)
Cardiac Individual Treatment Plan  Patient Details  Name: Brandon [name] MRN: [id number] Date of Birth: 05/27/1946 Referring Provider:     CARDIAC REHAB PHASE II ORIENTATION from 02/01/2018 in [place]  Referring Provider  Wyatt Haste MD and Loralie Champagne MD      Initial Encounter Date:    CARDIAC REHAB PHASE II ORIENTATION from 02/01/2018 in [id number]  Date  02/01/18  Referring Provider  Wyatt Haste MD and Loralie Champagne MD      Visit Diagnosis: Pacemaker  S/P MVR (mitral valve replacement)  S/P AVR (aortic valve replacement)  S/P CABG x 1  Patient's Home Medications on Admission:  Current Outpatient Medications:  .  acetaminophen (TYLENOL) 325 MG tablet, Take 1 tablet (325 mg total) by mouth every 6 (six) hours as needed for mild pain., Disp: 30 tablet, Rfl: 0 .  amiodarone (PACERONE) 200 MG tablet, Take 1 tablet (200 mg total) by mouth daily., Disp: 60 tablet, Rfl: 3 .  calcium carbonate (TUMS - DOSED IN MG ELEMENTAL CALCIUM) 500 MG chewable tablet, Chew 1 tablet by mouth daily as needed for indigestion or heartburn., Disp: , Rfl:  .  carvedilol (COREG) 6.25 MG tablet, Take 1 tablet (6.25 mg total) by mouth 2 (two) times daily., Disp: 180 tablet, Rfl: 3 .  cetirizine (ZYRTEC) 10 MG tablet, Take 10 mg by mouth daily as needed for allergies., Disp: , Rfl:  .  citalopram (CELEXA) 20 MG tablet, Take 1 tablet (20 mg total) by mouth at bedtime., Disp: 30 tablet, Rfl: 5 .  docusate sodium (COLACE) 100 MG capsule, Take 1 capsule (100 mg total) by mouth 3 (three) times daily as needed., Disp: 20 capsule, Rfl: 0 .  furosemide (LASIX) 40 MG tablet, Take 60 mg (1.5 tabs), alternating with 40 mg (1 tab) every other day, Disp: 75 tablet, Rfl: 3 .  ipratropium (ATROVENT) 0.03 % nasal spray, Place 2 sprays into both nostrils 2 (two) times daily. , Disp: , Rfl:  .  LORazepam (ATIVAN) 0.5 MG tablet, Take 1 tablet (0.5 mg total)  by mouth every 6 (six) hours as needed for anxiety (aggitation)., Disp: 20 tablet, Rfl: 0 .  lovastatin (MEVACOR) 40 MG tablet, TAKE 1 TABLET AT BEDTIME  (NEW  DOSE), Disp: 90 tablet, Rfl: 3 .  ondansetron (ZOFRAN) 4 MG tablet, Take 4 mg by mouth every 8 (eight) hours as needed for nausea or vomiting., Disp: , Rfl:  .  pantoprazole (PROTONIX) 40 MG tablet, Take 1 tablet (40 mg total) by mouth daily., Disp: 30 tablet, Rfl: 1 .  sacubitril-valsartan (ENTRESTO) 24-26 MG, Take 1 tablet by mouth 2 (two) times daily., Disp: 180 tablet, Rfl: 3 .  traZODone (DESYREL) 50 MG tablet, Take 50 mg by mouth at bedtime. , Disp: , Rfl:  .  warfarin (COUMADIN) 5 MG tablet, Take 1 tablet (5 mg total) by mouth daily at 6 PM., Disp: 40 tablet, Rfl: 3  Past Medical History: Past Medical History:  Diagnosis Date  . Anemia 07/13/2017  . Anxiety   . Aortic insufficiency   . Aortic stenosis, moderate 07/13/2017  . Arthritis    back   . Carotid stenosis    Right carotid stent (widely patent) 40 - 59% left plaque 11/13  . COPD (chronic obstructive pulmonary disease) ([place])   . Coronary artery disease involving native coronary artery of native heart without angina pectoris   . Depression   . Dyslipidemia   .  GERD (gastroesophageal reflux disease)   . Heart murmur   . Hemiplegia affecting unspecified side, late effect of cerebrovascular disease    resolved- from L side   . Hypertension   . Jaundice    resolved following ERCP & Cholecystectomy  . Mild emphysema ([place])   . Mitral regurgitation   . Mitral valve insufficiency and aortic valve insufficiency   . Myocardial infarction ([place])   . Paroxysmal atrial fibrillation ([facility])   . Pre-diabetes    per spouse  . S/P aortic valve replacement with bioprosthetic valve 11/16/2017   23 mm [facility] stented bovine pericardial tissue valve  . S/P CABG x 1 11/16/2017   SVG to PDA with EVH via right thigh  . S/P [place] operation for atrial fibrillation 11/16/2017   Left  side lesion set using bipolar radiofrequency and cryothermy ablation with clipping of LA appendage  . S/P mitral valve replacement with bioprosthetic valve 11/16/2017   27 mm [facility] Mitral stented bovine pericardial tissue valve  . Sleep apnea    does not wear CPAP  . Sleep concern    resulted in surgery- after + sleep test. Pt. doesn't have a problem any longer.   . Stroke ([place]) 03/11/2003   stent placed on the 31, 3, 2004, L side   . Wears glasses   . Wears hearing aid in both ears   . Wears partial dentures     Tobacco Use: Social History   Tobacco Use  Smoking Status Former Smoker  . Packs/day: 0.50  . Years: 57.00  . Pack years: 28.50  . Types: Cigarettes  . Last attempt to quit: 07/13/1997  . Years since quitting: 20.8  Smokeless Tobacco Never Used    Labs: Recent Review Flowsheet Data    Labs for ITP Cardiac and Pulmonary Rehab Latest Ref Rng & Units 11/17/2017 11/17/2017 12/26/2017 03/15/2018 04/11/2018   Cholestrol 0 - 200 mg/dL - - 132 141 -   LDLCALC 0 - 99 mg/dL - - 66 75 -   HDL >40 mg/dL - - 47 43 -   Trlycerides <150 mg/dL - - 97 117 -   Hemoglobin A1c 4.8 - 5.6 % - - - - -   PHART 7.350 - 7.450 7.308(L) - - - -   PCO2ART 32.0 - 48.0 mmHg 40.8 - - - -   HCO3 20.0 - 28.0 mmol/L 20.5 - - - -   TCO2 22 - 32 mmol/L 22 22 - - 24   ACIDBASEDEF 0.0 - 2.0 mmol/L 5.0(H) - - - -   O2SAT % 98.0 - - - -      Capillary Blood Glucose: Lab Results  Component Value Date   GLUCAP 114 (H) 11/21/2017   GLUCAP 164 (H) 11/20/2017   GLUCAP 99 11/20/2017   GLUCAP 61 (L) 11/20/2017   GLUCAP 168 (H) 11/20/2017     Exercise Target Goals:    Exercise Program Goal: Individual exercise prescription set using results from initial 6 min walk test and THRR while considering  patient's activity barriers and safety.   Exercise Prescription Goal: Initial exercise prescription builds to 30-45 minutes a day of aerobic activity, 2-3 days per week.  Home exercise guidelines will  be given to patient during program as part of exercise prescription that the participant will acknowledge.  Activity Barriers & Risk Stratification: Activity Barriers & Cardiac Risk Stratification - 02/01/18 0937      Activity Barriers & Cardiac Risk Stratification   Activity Barriers  Deconditioning;Muscular Weakness;Back Problems;Other (comment)    Comments  L shoulder limitations (RTC); R knee discomfort    Cardiac Risk Stratification  High       6 Minute Walk: 6 Minute Walk    Row Name 02/01/18 1146         6 Minute Walk   Phase  Initial     Distance  1322 feet     Walk Time  6 minutes     # of Rest Breaks  0     MPH  2.5     METS  1.6     RPE  11     VO2 Peak  5.7     Resting HR  62 bpm     Resting BP  128/70     Resting Oxygen Saturation   96 %     Exercise Oxygen Saturation  during 6 min walk  100 %     Max Ex. HR  92 bpm     Max Ex. BP  128/74     2 Minute Post BP  118/68        Oxygen Initial Assessment:   Oxygen Re-Evaluation:   Oxygen Discharge (Final Oxygen Re-Evaluation):   Initial Exercise Prescription: Initial Exercise Prescription - 02/01/18 1100      Date of Initial Exercise RX and Referring Provider   Date  02/01/18    Referring Provider  Wyatt Haste MD and Loralie Champagne MD      Bike   Level  0.3    Minutes  10    METs  1.51      NuStep   Level  2    SPM  70    Minutes  10    METs  1.5      Track   Laps  7    Minutes  10    METs  2.23      Prescription Details   Frequency (times per week)  3    Duration  Progress to 30 minutes of continuous aerobic without signs/symptoms of physical distress      Intensity   THRR 40-80% of Max Heartrate  60-119    Ratings of Perceived Exertion  11-13    Perceived Dyspnea  0-4      Progression   Progression  Continue to progress workloads to maintain intensity without signs/symptoms of physical distress.      Resistance Training   Training Prescription  Yes    Weight  2lbs    Reps   10-15       Perform Capillary Blood Glucose checks as needed.  Exercise Prescription Changes: Exercise Prescription Changes    Row Name 02/05/18 1600 02/15/18 1600 02/23/18 0730 03/16/18 0800 03/26/18 1649     Response to Exercise   Blood Pressure (Admit)  104/70  110/60  100/70  104/70  100/66   Blood Pressure (Exercise)  126/70  126/78  132/78  132/70  104/64   Blood Pressure (Exit)  120/64  104/70  98/97  102/78  104/62   Heart Rate (Admit)  75 bpm  62 bpm  70 bpm  57 bpm  67 bpm   Heart Rate (Exercise)  98 bpm  94 bpm  91 bpm  89 bpm  88 bpm   Heart Rate (Exit)  75 bpm  70 bpm  67 bpm  69 bpm  63 bpm   Rating of Perceived Exertion (Exercise)  _0 12  Perceived Dyspnea (Exercise)  0  0  0  0  -   Symptoms  None   -  -  -  None   Comments  Pt oriented to exericse equipment  -  -  -  -   Duration  Progress to 30 minutes of  aerobic without signs/symptoms of physical distress  Progress to 30 minutes of  aerobic without signs/symptoms of physical distress  Continue with 30 min of aerobic exercise without signs/symptoms of physical distress.  Continue with 30 min of aerobic exercise without signs/symptoms of physical distress.  Continue with 30 min of aerobic exercise without signs/symptoms of physical distress.   Intensity  THRR New  THRR unchanged  THRR unchanged  THRR unchanged  THRR unchanged     Progression   Progression  Continue to progress workloads to maintain intensity without signs/symptoms of physical distress.  Continue to progress workloads to maintain intensity without signs/symptoms of physical distress.  Continue to progress workloads to maintain intensity without signs/symptoms of physical distress.  Continue to progress workloads to maintain intensity without signs/symptoms of physical distress.  Continue to progress workloads to maintain intensity without signs/symptoms of physical distress.   Average METs  2.03  2.34  2.28  2.8  2.55     Resistance Training    Training Prescription  Yes  No  No  No  Yes   Weight  2lbs  -  -  -  2lbs   Reps  10-15  -  -  -  10-15   Time  10 Minutes  -  -  -  10 Minutes     Interval Training   Interval Training  No  No  No  No  No     Bike   Level  0.3  0.8  0._0 Minutes  _1 METs  1.52  2.38  2.37  2.69  2.7     NuStep   Level  _2 SPM  75  80  85  85  85   Minutes  _3 METs  2  2.3  2.2  2.9  2.4     Track   Laps  9  -  -  -  -   Minutes  10  -  -  -  -   METs  2.53  -  -  -  -     Home Exercise Plan   Plans to continue exercise at  -  -  Home (comment) Walking  Home (comment) Walking  Home (comment) Walking    Frequency  -  -  Add 2 additional days to program exercise sessions.  Add 2 additional days to program exercise sessions.  Add 2 additional days to program exercise sessions.   Initial Home Exercises Provided  -  -  02/21/18  02/21/18  02/21/18   Row Name 04/18/18 0733 04/25/18 1202           Response to Exercise   Blood Pressure (Admit)  118/70  102/62      Blood Pressure (Exercise)  114/72  118/72      Blood Pressure (Exit)  94/90  98/62      Heart Rate (Admit)  60 bpm  72 bpm      Heart Rate (  Exercise)  89 bpm  83 bpm      Heart Rate (Exit)  61 bpm  66 bpm      Rating of Perceived Exertion (Exercise)  13  11      Perceived Dyspnea (Exercise)  0  0      Symptoms  None  None      Duration  Continue with 30 min of aerobic exercise without signs/symptoms of physical distress.  Continue with 30 min of aerobic exercise without signs/symptoms of physical distress.      Intensity  THRR unchanged  THRR unchanged        Progression   Progression  Continue to progress workloads to maintain intensity without signs/symptoms of physical distress.  Continue to progress workloads to maintain intensity without signs/symptoms of physical distress.      Average METs  2.9  2.9        Resistance Training   Training Prescription  No  Yes       Weight  -  3lbs      Reps  -  10-15      Time  -  10 Minutes        Interval Training   Interval Training  No  No        Bike   Level  1.5  1.5      Minutes  15  15      METs  3.49  3.53        NuStep   Level  4  4      SPM  85  85      Minutes  15  15      METs  2.4  2.3        Home Exercise Plan   Plans to continue exercise at  Home (comment) Walking  Home (comment) Walking      Frequency  Add 2 additional days to program exercise sessions.  Add 2 additional days to program exercise sessions.      Initial Home Exercises Provided  02/21/18  02/21/18         Exercise Comments: Exercise Comments    Row Name 02/05/18 1618 02/21/18 1641 03/01/18 0733 03/28/18 1657 04/26/18 1209   Exercise Comments  Today was pt's first day of rehab. Pt was oriented to exericse equipment. Pt is off to great start.   Reviewed HEP with pt. Pt voiced understanding and plans to start exercising at home in addition to cardiac rehab exercise. Will continue to monitor and follow up with pt about HEP.   Pt is responding well to exericse prescription. Pt states he is feeling stronger since starting rehab. Will continue to progress pt as tolerated and monitor.   Reviewed METs and Goals with pt. Pt has responded well to workload increases. Will continue to monitor and progress pt.   Reviewed METs and Goals with pt. Pt is slowly progressing. Will continue to work with pt to increase average MET levels. Will continue to monitor pt.       Exercise Goals and Review: Exercise Goals    Row Name 02/01/18 [phone]             Exercise Goals   Increase Physical Activity  Yes       Intervention  Provide advice, education, support and counseling about physical activity/exercise needs.;Develop an individualized exercise prescription for aerobic and resistive training based on initial evaluation findings, risk stratification, comorbidities and participant's personal goals.  Expected Outcomes  Short Term: Attend rehab  on a regular basis to increase amount of physical activity.;Long Term: Add in home exercise to make exercise part of routine and to increase amount of physical activity.;Long Term: Exercising regularly at least 3-5 days a week.       Increase Strength and Stamina  Yes       Intervention  Provide advice, education, support and counseling about physical activity/exercise needs.;Develop an individualized exercise prescription for aerobic and resistive training based on initial evaluation findings, risk stratification, comorbidities and participant's personal goals.       Expected Outcomes  Short Term: Increase workloads from initial exercise prescription for resistance, speed, and METs.;Short Term: Perform resistance training exercises routinely during rehab and add in resistance training at home;Long Term: Improve cardiorespiratory fitness, muscular endurance and strength as measured by increased METs and functional capacity (6MWT)       Able to understand and use rate of perceived exertion (RPE) scale  Yes       Intervention  Provide education and explanation on how to use RPE scale       Expected Outcomes  Short Term: Able to use RPE daily in rehab to express subjective intensity level;Long Term:  Able to use RPE to guide intensity level when exercising independently       Knowledge and understanding of Target Heart Rate Range (THRR)  Yes       Intervention  Provide education and explanation of THRR including how the numbers were predicted and where they are located for reference       Expected Outcomes  Short Term: Able to state/look up THRR;Short Term: Able to use daily as guideline for intensity in rehab;Long Term: Able to use THRR to govern intensity when exercising independently       Able to check pulse independently  Yes       Intervention  Provide education and demonstration on how to check pulse in carotid and radial arteries.;Review the importance of being able to check your own pulse for safety  during independent exercise       Expected Outcomes  Short Term: Able to explain why pulse checking is important during independent exercise;Long Term: Able to check pulse independently and accurately       Understanding of Exercise Prescription  Yes       Intervention  Provide education, explanation, and written materials on patient's individual exercise prescription       Expected Outcomes  Short Term: Able to explain program exercise prescription;Long Term: Able to explain home exercise prescription to exercise independently          Exercise Goals Re-Evaluation : Exercise Goals Re-Evaluation    Row Name 02/21/18 1642 03/01/18 0737 03/28/18 1659 04/26/18 1212       Exercise Goal Re-Evaluation   Exercise Goals Review  Understanding of Exercise Prescription;Increase Physical Activity;Able to understand and use rate of perceived exertion (RPE) scale;Knowledge and understanding of Target Heart Rate Range (THRR);Increase Strength and Stamina  Understanding of Exercise Prescription;Increase Physical Activity;Increase Strength and Stamina  Understanding of Exercise Prescription;Increase Physical Activity;Increase Strength and Stamina  Understanding of Exercise Prescription;Increase Physical Activity;Increase Strength and Stamina    Comments  Reveiwed HEP with pt, in addition to THRR, RPE scale, weather precautions, NTG use, endpoints of exercise, warmup and cool down.   Pt is able to exericse for 30 minutes with minimal difficulty. Will continue to work with pt on progress workloads.   Pt is doing great in  rehab. Pt is now exercising at a level 4 on the Nustep with no difficulty. Will work with pt to increase handweight lbs to help aid in improving musclar strength.   Pt is able to exercise for 30 minutes with no difficulty. Working with pt to increase MET averages. Pt is still averaging 2.9 METs, but seems to enjoy rehab and states it helps him feel stronger.     Expected Outcomes  Pt plans to exercise  2 additional days at home by walking. Pt will continue increase his physical activity on nonrehab days. Will continue to follow and monitor pt.   Pt will continue to work on increasing his physical activity on nonrehab days. Pt will continue ot increase cardiorespiratory fitness.   Pt will continue to walk 2 additional days for 15 minutes. Pt will work to increase to 30 minutes gradually. Will continue to follow up with pt about home exercise progress.   Pt will continue to increase functional capacity and muscular strength. Pt will continue to walk 2 days at home for 20 minutes. Will continue to monitor pt's progress.         Discharge Exercise Prescription (Final Exercise Prescription Changes): Exercise Prescription Changes - 04/25/18 1202      Response to Exercise   Blood Pressure (Admit)  102/62    Blood Pressure (Exercise)  118/72    Blood Pressure (Exit)  98/62    Heart Rate (Admit)  72 bpm    Heart Rate (Exercise)  83 bpm    Heart Rate (Exit)  66 bpm    Rating of Perceived Exertion (Exercise)  11    Perceived Dyspnea (Exercise)  0    Symptoms  None    Duration  Continue with 30 min of aerobic exercise without signs/symptoms of physical distress.    Intensity  THRR unchanged      Progression   Progression  Continue to progress workloads to maintain intensity without signs/symptoms of physical distress.    Average METs  2.9      Resistance Training   Training Prescription  Yes    Weight  3lbs    Reps  10-15    Time  10 Minutes      Interval Training   Interval Training  No      Bike   Level  1.5    Minutes  15    METs  3.53      NuStep   Level  4    SPM  85    Minutes  15    METs  2.3      Home Exercise Plan   Plans to continue exercise at  Home (comment) Walking    Frequency  Add 2 additional days to program exercise sessions.    Initial Home Exercises Provided  02/21/18       Nutrition:  Target Goals: Understanding of nutrition guidelines, daily intake of  sodium <1524m, cholesterol <2084m calories 30% from fat and 7% or less from saturated fats, daily to have 5 or more servings of fruits and vegetables.  Biometrics: Pre Biometrics - 02/01/18 1153      Pre Biometrics   Height  5' 8.5" (1.74 m)    Weight  242 lb 1 oz (109.8 kg)    Waist Circumference  48 inches    Hip Circumference  45 inches    Waist to Hip Ratio  1.07 %    BMI (Calculated)  36.27    Triceps Skinfold  30  mm    % Body Fat  45.2 %    Grip Strength  30 kg    Flexibility  8 in    Single Leg Stand  30 seconds        Nutrition Therapy Plan and Nutrition Goals: Nutrition Therapy & Goals - 02/12/18 1209      Nutrition Therapy   Diet  Heart Healthy    Drug/Food Interactions  Coumadin/Vit K      Personal Nutrition Goals   Nutrition Goal  Pt to identify and limit food sources of saturated fat, trans fat, and sodium    Personal Goal #2  Pt to identify food quantities necessary to achieve weight loss of 6-24 lb (2.7-10.9 kg) at graduation from cardiac rehab. Goal wt of 200 lb desired.       Intervention Plan   Intervention  Prescribe, educate and counsel regarding individualized specific dietary modifications aiming towards targeted core components such as weight, hypertension, lipid management, diabetes, heart failure and other comorbidities.;Nutrition handout(s) given to patient. Pre-diabetes    Expected Outcomes  Short Term Goal: Understand basic principles of dietary content, such as calories, fat, sodium, cholesterol and nutrients.;Long Term Goal: Adherence to prescribed nutrition plan.       Nutrition Assessments: Nutrition Assessments - 02/01/18 1113      MEDFICTS Scores   Pre Score  48       Nutrition Goals Re-Evaluation:   Nutrition Goals Re-Evaluation:   Nutrition Goals Discharge (Final Nutrition Goals Re-Evaluation):   Psychosocial: Target Goals: Acknowledge presence or absence of significant depression and/or stress, maximize coping skills,  provide positive support system. Participant is able to verbalize types and ability to use techniques and skills needed for reducing stress and depression.  Initial Review & Psychosocial Screening: Initial Psych Review & Screening - 02/01/18 0900      Initial Review   Current issues with  None Identified      Family Dynamics   Good Support System?  Yes wife and family       Barriers   Psychosocial barriers to participate in program  There are no identifiable barriers or psychosocial needs.      Screening Interventions   Interventions  Encouraged to exercise    Expected Outcomes  Short Term goal: Identification and review with participant of any Quality of Life or Depression concerns found by scoring the questionnaire.;Long Term goal: The participant improves quality of Life and PHQ9 Scores as seen by post scores and/or verbalization of changes       Quality of Life Scores: Quality of Life - 02/01/18 0900      Quality of Life Scores   Health/Function Pre  24.9 %    Socioeconomic Pre  28.19 %    Psych/Spiritual Pre  26.14 %    Family Pre  24 %    GLOBAL Pre  25.77 %      Scores of 19 and below usually indicate a poorer quality of life in these areas.  A difference of  2-3 points is a clinically meaningful difference.  A difference of 2-3 points in the total score of the Quality of Life Index has been associated with significant improvement in overall quality of life, self-image, physical symptoms, and general health in studies assessing change in quality of life.  PHQ-9: Recent Review Flowsheet Data    Depression screen [facility] 2/9 02/05/2018   Decreased Interest 0   Down, Depressed, Hopeless 0   PHQ - 2 Score 0  Interpretation of Total Score  Total Score Depression Severity:  1-4 = Minimal depression, 5-9 = Mild depression, 10-14 = Moderate depression, 15-19 = Moderately severe depression, 20-27 = Severe depression   Psychosocial Evaluation and  Intervention:   Psychosocial Re-Evaluation: Psychosocial Re-Evaluation    [place] Name 03/01/18 1414 03/29/18 1111 04/26/18 1405         Psychosocial Re-Evaluation   Current issues with  Current Stress Concerns  None Identified  None Identified     Comments  Will continue to moitnor the patient for signs of stress as no complaints have been voiced at cardiac rehab  No psychosocial needs identified. No interventio necessary.   No psychosocial needs identified. No intervention necessary.      Expected Outcomes  -  Pt will continue to utilize good coping skills and maintain a positive outlook.   Pt will continue to utilize good coping skills and maintain a positive outlook.      Interventions  Encouraged to attend Cardiac Rehabilitation for the exercise;Stress management education  Encouraged to attend Cardiac Rehabilitation for the exercise;Stress management education  Encouraged to attend Cardiac Rehabilitation for the exercise;Stress management education     Continue Psychosocial Services   -  No Follow up required  No Follow up required       Initial Review   Source of Stress Concerns  Chronic Illness  -  -        Psychosocial Discharge (Final Psychosocial Re-Evaluation): Psychosocial Re-Evaluation - 04/26/18 1405      Psychosocial Re-Evaluation   Current issues with  None Identified    Comments  No psychosocial needs identified. No intervention necessary.     Expected Outcomes  Pt will continue to utilize good coping skills and maintain a positive outlook.     Interventions  Encouraged to attend Cardiac Rehabilitation for the exercise;Stress management education    Continue Psychosocial Services   No Follow up required       Vocational Rehabilitation: Provide vocational rehab assistance to qualifying candidates.   Vocational Rehab Evaluation & Intervention: Vocational Rehab - 02/01/18 0900      Initial Vocational Rehab Evaluation & Intervention   Assessment shows need for  Vocational Rehabilitation  No retired        Education: Education Goals: Education classes will be provided on a weekly basis, covering required topics. Participant will state understanding/return demonstration of topics presented.  Learning Barriers/Preferences: Learning Barriers/Preferences - 02/01/18 0936      Learning Barriers/Preferences   Learning Barriers  Sight;Hearing glasses; hearing aids    Learning Preferences  Skilled Demonstration;Verbal Instruction;Individual Instruction       Education Topics: Count Your Pulse:  -Group instruction provided by verbal instruction, demonstration, patient participation and written materials to support subject.  Instructors address importance of being able to find your pulse and how to count your pulse when at home without a heart monitor.  Patients get hands on experience counting their pulse with staff help and individually.   CARDIAC REHAB PHASE II EXERCISE from 04/25/2018 in [place]  Date  03/23/18  Instruction Review Code  1- Verbalizes Understanding      Heart Attack, Angina, and Risk Factor Modification:  -Group instruction provided by verbal instruction, video, and written materials to support subject.  Instructors address signs and symptoms of angina and heart attacks.    Also discuss risk factors for heart disease and how to make changes to improve heart health risk factors.   CARDIAC  REHAB PHASE II EXERCISE from 04/25/2018 in [place]  Date  04/04/18  Educator  RN  Instruction Review Code  2- Demonstrated Understanding      Functional Fitness:  -Group instruction provided by verbal instruction, demonstration, patient participation, and written materials to support subject.  Instructors address safety measures for doing things around the house.  Discuss how to get up and down off the floor, how to pick things up properly, how to safely get out of a chair without  assistance, and balance training.   CARDIAC REHAB PHASE II EXERCISE from 04/25/2018 in [place]  Date  02/23/18  Instruction Review Code  2- Demonstrated Understanding      Meditation and Mindfulness:  -Group instruction provided by verbal instruction, patient participation, and written materials to support subject.  Instructor addresses importance of mindfulness and meditation practice to help reduce stress and improve awareness.  Instructor also leads participants through a meditation exercise.    CARDIAC REHAB PHASE II EXERCISE from 04/25/2018 in [place]  Date  04/25/18  Instruction Review Code  2- Demonstrated Understanding      Stretching for Flexibility and Mobility:  -Group instruction provided by verbal instruction, patient participation, and written materials to support subject.  Instructors lead participants through series of stretches that are designed to increase flexibility thus improving mobility.  These stretches are additional exercise for major muscle groups that are typically performed during regular warm up and cool down.   Hands Only CPR:  -Group verbal, video, and participation provides a basic overview of AHA guidelines for community CPR. Role-play of emergencies allow participants the opportunity to practice calling for help and chest compression technique with discussion of AED use.   Hypertension: -Group verbal and written instruction that provides a basic overview of hypertension including the most recent diagnostic guidelines, risk factor reduction with self-care instructions and medication management.   CARDIAC REHAB PHASE II EXERCISE from 04/25/2018 in [place]  Date  04/13/18  Instruction Review Code  2- Demonstrated Understanding       Nutrition I class: Heart Healthy Eating:  -Group instruction provided by PowerPoint slides, verbal discussion, and  written materials to support subject matter. The instructor gives an explanation and review of the Therapeutic Lifestyle Changes diet recommendations, which includes a discussion on lipid goals, dietary fat, sodium, fiber, plant stanol/sterol esters, sugar, and the components of a well-balanced, healthy diet.   Nutrition II class: Lifestyle Skills:  -Group instruction provided by PowerPoint slides, verbal discussion, and written materials to support subject matter. The instructor gives an explanation and review of label reading, grocery shopping for heart health, heart healthy recipe modifications, and ways to make healthier choices when eating out.   Diabetes Question & Answer:  -Group instruction provided by PowerPoint slides, verbal discussion, and written materials to support subject matter. The instructor gives an explanation and review of diabetes co-morbidities, pre- and post-prandial blood glucose goals, pre-exercise blood glucose goals, signs, symptoms, and treatment of hypoglycemia and hyperglycemia, and foot care basics.   CARDIAC REHAB PHASE II EXERCISE from 04/25/2018 in [place]  Date  04/20/18  Educator  RD  Instruction Review Code  2- Demonstrated Understanding      Diabetes Blitz:  -Group instruction provided by PowerPoint slides, verbal discussion, and written materials to support subject matter. The instructor gives an explanation and review of the physiology behind type  1 and type 2 diabetes, diabetes medications and rational behind using different medications, pre- and post-prandial blood glucose recommendations and Hemoglobin A1c goals, diabetes diet, and exercise including blood glucose guidelines for exercising safely.    Portion Distortion:  -Group instruction provided by PowerPoint slides, verbal discussion, written materials, and food models to support subject matter. The instructor gives an explanation of serving size versus portion  size, changes in portions sizes over the last 20 years, and what consists of a serving from each food group.   CARDIAC REHAB PHASE II EXERCISE from 04/25/2018 in [place]  Date  03/07/18  Educator  RD  Instruction Review Code  2- Demonstrated Understanding      Stress Management:  -Group instruction provided by verbal instruction, video, and written materials to support subject matter.  Instructors review role of stress in heart disease and how to cope with stress positively.     Exercising on Your Own:  -Group instruction provided by verbal instruction, power point, and written materials to support subject.  Instructors discuss benefits of exercise, components of exercise, frequency and intensity of exercise, and end points for exercise.  Also discuss use of nitroglycerin and activating EMS.  Review options of places to exercise outside of rehab.  Review guidelines for sex with heart disease.   CARDIAC REHAB PHASE II EXERCISE from 04/25/2018 in [place]  Date  02/14/18  Instruction Review Code  2- Demonstrated Understanding      Cardiac Drugs I:  -Group instruction provided by verbal instruction and written materials to support subject.  Instructor reviews cardiac drug classes: antiplatelets, anticoagulants, beta blockers, and statins.  Instructor discusses reasons, side effects, and lifestyle considerations for each drug class.   CARDIAC REHAB PHASE II EXERCISE from 04/25/2018 in [place]  Date  04/18/18  Instruction Review Code  2- Demonstrated Understanding      Cardiac Drugs II:  -Group instruction provided by verbal instruction and written materials to support subject.  Instructor reviews cardiac drug classes: angiotensin converting enzyme inhibitors (ACE-I), angiotensin II receptor blockers (ARBs), nitrates, and calcium channel blockers.  Instructor discusses reasons, side effects,  and lifestyle considerations for each drug class.   CARDIAC REHAB PHASE II EXERCISE from 04/25/2018 in [place]  Date  03/21/18  Instruction Review Code  2- Demonstrated Understanding      Anatomy and Physiology of the Circulatory System:  Group verbal and written instruction and models provide basic cardiac anatomy and physiology, with the coronary electrical and arterial systems. Review of: AMI, Angina, Valve disease, Heart Failure, Peripheral Artery Disease, Cardiac Arrhythmia, Pacemakers, and the ICD.   Other Education:  -Group or individual verbal, written, or video instructions that support the educational goals of the cardiac rehab program.   CARDIAC REHAB PHASE II EXERCISE from 04/25/2018 in [place]  Date  02/21/18  Educator  CVA video  Instruction Review Code  2- Demonstrated Understanding      Holiday Eating Survival Tips:  -Group instruction provided by PowerPoint slides, verbal discussion, and written materials to support subject matter. The instructor gives patients tips, tricks, and techniques to help them not only survive but enjoy the holidays despite the onslaught of food that accompanies the holidays.   Knowledge Questionnaire Score: Knowledge Questionnaire Score - 02/01/18 0859      Knowledge Questionnaire Score   Pre Score  22/24  Core Components/Risk Factors/Patient Goals at Admission: Personal Goals and Risk Factors at Admission - 02/01/18 1154      Core Components/Risk Factors/Patient Goals on Admission   Tobacco Cessation  Yes quit date 07/2017    Number of packs per day  1/2 ppd x 50 years    Intervention  Assist the participant in steps to quit. Provide individualized education and counseling about committing to Tobacco Cessation, relapse prevention, and pharmacological support that can be provided by physician.;Advice worker, assist with locating and accessing  local/national Quit Smoking programs, and support quit date choice.    Expected Outcomes  Short Term: Will demonstrate readiness to quit, by selecting a quit date.;Short Term: Will quit all tobacco product use, adhering to prevention of relapse plan.;Long Term: Complete abstinence from all tobacco products for at least 12 months from quit date.    Heart Failure  Yes    Intervention  Provide a combined exercise and nutrition program that is supplemented with education, support and counseling about heart failure. Directed toward relieving symptoms such as shortness of breath, decreased exercise tolerance, and extremity edema.    Expected Outcomes  Improve functional capacity of life;Short term: Attendance in program 2-3 days a week with increased exercise capacity. Reported lower sodium intake. Reported increased fruit and vegetable intake. Reports medication compliance.;Short term: Daily weights obtained and reported for increase. Utilizing diuretic protocols set by physician.;Long term: Adoption of self-care skills and reduction of barriers for early signs and symptoms recognition and intervention leading to self-care maintenance.    Hypertension  Yes    Intervention  Provide education on lifestyle modifcations including regular physical activity/exercise, weight management, moderate sodium restriction and increased consumption of fresh fruit, vegetables, and low fat dairy, alcohol moderation, and smoking cessation.;Monitor prescription use compliance.    Expected Outcomes  Short Term: Continued assessment and intervention until BP is < 140/32m HG in hypertensive participants. < 130/815mHG in hypertensive participants with diabetes, heart failure or chronic kidney disease.;Long Term: Maintenance of blood pressure at goal levels.    Lipids  Yes    Intervention  Provide education and support for participant on nutrition & aerobic/resistive exercise along with prescribed medications to achieve LDL <7051mHDL  >25m31m  Expected Outcomes  Short Term: Participant states understanding of desired cholesterol values and is compliant with medications prescribed. Participant is following exercise prescription and nutrition guidelines.;Long Term: Cholesterol controlled with medications as prescribed, with individualized exercise RX and with personalized nutrition plan. Value goals: LDL < 70mg64mL > 40 mg.       Core Components/Risk Factors/Patient Goals Review:  Goals and Risk Factor Review    Row Name 03/01/18 1406 03/29/18 1109 04/26/18 1404         Core Components/Risk Factors/Patient Goals Review   Personal Goals Review  Weight Management/Obesity;Heart Failure;Hypertension;Lipids  Weight Management/Obesity;Heart Failure;Hypertension;Lipids  Weight Management/Obesity;Heart Failure;Hypertension;Lipids     Review  Robel's recent post exercise blood pressures have been low at cardiac rehab. JimmyVintbeen doing well with exercise and has been asymptomatic.  Chukwuemeka's blood pressures have been maintaining within parameters defined by MD. JimmyLaverna Peacebeen doing well with exercise and has been asymptomatic..  Zylen's vitals have been maintaining lately.  JimmyJathenaintaining with slow but steady progress.  JimmyFinnbarbeen attending education classes and feels that he is learning.     Expected Outcomes  Will continue to monitor vital signs at cardiac rehab. JimmyThien continue to take his medications as prescribed.  Trevan will continue to participate in CR exercise, nutrition, and lifestyle modification opportunities.   Adriene will continue to participate in CR exercise, nutrition, and lifestyle modification opportunities.         Core Components/Risk Factors/Patient Goals at Discharge (Final Review):  Goals and Risk Factor Review - 04/26/18 1404      Core Components/Risk Factors/Patient Goals Review   Personal Goals Review  Weight Management/Obesity;Heart Failure;Hypertension;Lipids    Review  Jaskarn's vitals have  been maintaining lately.  Darrol is maintaining with slow but steady progress.  Acelin has been attending education classes and feels that he is learning.    Expected Outcomes  Lc will continue to participate in CR exercise, nutrition, and lifestyle modification opportunities.        ITP Comments: ITP Comments    Row Name 02/01/18 0853 03/01/18 1404 03/29/18 1108 04/26/18 1406     ITP Comments  Dr. Fransico Him, Medical Director   30 day ITP review. Patient off to a good start to exercise. Will continue to monitor BP  30 Day ITP Review.  Pt demonstrates willingness to attend CR Program.  BP has been low at times but within parameters set by MD and pt is asymptomatic.   30 Day ITP Review. Pt's vitals remain stable at CR.  He is making slow but steady progress.       Comments: See ITP Comments.

## 2018-04-27 ENCOUNTER — Encounter (HOSPITAL_COMMUNITY)
Admission: RE | Admit: 2018-04-27 | Discharge: 2018-04-27 | Disposition: A | Discharge status: Home / Self Care | Payer: Medicare HMO | Source: Ambulatory Visit | Attending: Internal Medicine | Admitting: Internal Medicine

## 2018-04-27 DIAGNOSIS — Z951 Presence of aortocoronary bypass graft: Secondary | ICD-10-CM

## 2018-04-27 DIAGNOSIS — K219 Gastro-esophageal reflux disease without esophagitis: Secondary | ICD-10-CM | POA: Diagnosis not present

## 2018-04-27 DIAGNOSIS — Z9889 Other specified postprocedural states: Secondary | ICD-10-CM | POA: Diagnosis not present

## 2018-04-27 DIAGNOSIS — Z952 Presence of prosthetic heart valve: Secondary | ICD-10-CM

## 2018-04-27 DIAGNOSIS — J449 Chronic obstructive pulmonary disease, unspecified: Secondary | ICD-10-CM | POA: Diagnosis not present

## 2018-04-27 DIAGNOSIS — Z79891 Long term (current) use of opiate analgesic: Secondary | ICD-10-CM | POA: Diagnosis not present

## 2018-04-27 DIAGNOSIS — Z87891 Personal history of nicotine dependence: Secondary | ICD-10-CM | POA: Diagnosis not present

## 2018-04-27 DIAGNOSIS — Z8673 Personal history of transient ischemic attack (TIA), and cerebral infarction without residual deficits: Secondary | ICD-10-CM | POA: Diagnosis not present

## 2018-04-27 DIAGNOSIS — Z950 Presence of cardiac pacemaker: Secondary | ICD-10-CM

## 2018-04-27 DIAGNOSIS — Z953 Presence of xenogenic heart valve: Secondary | ICD-10-CM | POA: Diagnosis not present

## 2018-04-27 DIAGNOSIS — Z79899 Other long term (current) drug therapy: Secondary | ICD-10-CM | POA: Diagnosis not present

## 2018-04-30 ENCOUNTER — Ambulatory Visit (INDEPENDENT_AMBULATORY_CARE_PROVIDER_SITE_OTHER): Payer: Medicare HMO | Admitting: *Deleted

## 2018-04-30 ENCOUNTER — Encounter (HOSPITAL_COMMUNITY)
Admission: RE | Admit: 2018-04-30 | Discharge: 2018-04-30 | Disposition: A | Discharge status: Home / Self Care | Payer: Medicare HMO | Source: Ambulatory Visit | Attending: Internal Medicine | Admitting: Internal Medicine

## 2018-04-30 DIAGNOSIS — K219 Gastro-esophageal reflux disease without esophagitis: Secondary | ICD-10-CM | POA: Diagnosis not present

## 2018-04-30 DIAGNOSIS — Z87891 Personal history of nicotine dependence: Secondary | ICD-10-CM | POA: Diagnosis not present

## 2018-04-30 DIAGNOSIS — Z951 Presence of aortocoronary bypass graft: Secondary | ICD-10-CM

## 2018-04-30 DIAGNOSIS — Z5181 Encounter for therapeutic drug level monitoring: Secondary | ICD-10-CM | POA: Diagnosis not present

## 2018-04-30 DIAGNOSIS — Z79899 Other long term (current) drug therapy: Secondary | ICD-10-CM | POA: Diagnosis not present

## 2018-04-30 DIAGNOSIS — I480 Paroxysmal atrial fibrillation: Secondary | ICD-10-CM

## 2018-04-30 DIAGNOSIS — Z8679 Personal history of other diseases of the circulatory system: Secondary | ICD-10-CM | POA: Diagnosis not present

## 2018-04-30 DIAGNOSIS — J449 Chronic obstructive pulmonary disease, unspecified: Secondary | ICD-10-CM | POA: Diagnosis not present

## 2018-04-30 DIAGNOSIS — Z953 Presence of xenogenic heart valve: Secondary | ICD-10-CM

## 2018-04-30 DIAGNOSIS — Z952 Presence of prosthetic heart valve: Secondary | ICD-10-CM

## 2018-04-30 DIAGNOSIS — Z9889 Other specified postprocedural states: Secondary | ICD-10-CM

## 2018-04-30 DIAGNOSIS — Z79891 Long term (current) use of opiate analgesic: Secondary | ICD-10-CM | POA: Diagnosis not present

## 2018-04-30 DIAGNOSIS — Z8673 Personal history of transient ischemic attack (TIA), and cerebral infarction without residual deficits: Secondary | ICD-10-CM | POA: Diagnosis not present

## 2018-04-30 LAB — POCT INR: INR: 2 (ref 2.0–3.0)

## 2018-04-30 NOTE — Patient Instructions (Signed)
Description   Continue taking 1 tablet daily except 1.5 tablets on Mondays.  Recheck INR in 3 weeks.  Call with any new medications scheduled procedures or any questions  [phone]

## 2018-04-30 NOTE — Telephone Encounter (Signed)
Pt came in for visit

## 2018-05-02 ENCOUNTER — Ambulatory Visit (HOSPITAL_COMMUNITY)
Admission: RE | Admit: 2018-05-02 | Discharge: 2018-05-02 | Disposition: A | Discharge status: Home / Self Care | Payer: Medicare HMO | Source: Ambulatory Visit | Attending: Internal Medicine | Admitting: Internal Medicine

## 2018-05-02 ENCOUNTER — Encounter (HOSPITAL_COMMUNITY)
Admission: RE | Admit: 2018-05-02 | Discharge: 2018-05-02 | Disposition: A | Discharge status: Home / Self Care | Payer: Medicare HMO | Source: Ambulatory Visit | Attending: Internal Medicine | Admitting: Internal Medicine

## 2018-05-02 DIAGNOSIS — K219 Gastro-esophageal reflux disease without esophagitis: Secondary | ICD-10-CM | POA: Diagnosis not present

## 2018-05-02 DIAGNOSIS — Z87891 Personal history of nicotine dependence: Secondary | ICD-10-CM | POA: Diagnosis not present

## 2018-05-02 DIAGNOSIS — Z950 Presence of cardiac pacemaker: Secondary | ICD-10-CM

## 2018-05-02 DIAGNOSIS — Z952 Presence of prosthetic heart valve: Secondary | ICD-10-CM

## 2018-05-02 DIAGNOSIS — Z8673 Personal history of transient ischemic attack (TIA), and cerebral infarction without residual deficits: Secondary | ICD-10-CM | POA: Diagnosis not present

## 2018-05-02 DIAGNOSIS — Z953 Presence of xenogenic heart valve: Secondary | ICD-10-CM | POA: Diagnosis not present

## 2018-05-02 DIAGNOSIS — Z951 Presence of aortocoronary bypass graft: Secondary | ICD-10-CM | POA: Diagnosis not present

## 2018-05-02 DIAGNOSIS — J449 Chronic obstructive pulmonary disease, unspecified: Secondary | ICD-10-CM | POA: Diagnosis not present

## 2018-05-02 DIAGNOSIS — Z79899 Other long term (current) drug therapy: Secondary | ICD-10-CM | POA: Diagnosis not present

## 2018-05-02 DIAGNOSIS — D509 Iron deficiency anemia, unspecified: Secondary | ICD-10-CM | POA: Diagnosis not present

## 2018-05-02 DIAGNOSIS — Z9889 Other specified postprocedural states: Secondary | ICD-10-CM | POA: Diagnosis not present

## 2018-05-02 DIAGNOSIS — Z79891 Long term (current) use of opiate analgesic: Secondary | ICD-10-CM | POA: Diagnosis not present

## 2018-05-02 LAB — IRON AND TIBC
Iron: 88 ug/dL (ref 45–182)
Saturation Ratios: 32 % (ref 17.9–39.5)
TIBC: 277 ug/dL (ref 250–450)
UIBC: 189 ug/dL

## 2018-05-02 LAB — FERRITIN: Ferritin: 274 ng/mL (ref 24–336)

## 2018-05-03 MED FILL — WARFARIN SODIUM 5 MG TABLET: 5 | 40 days supply | Qty: 40 | Fill #2

## 2018-05-04 ENCOUNTER — Encounter (HOSPITAL_COMMUNITY)
Admission: RE | Admit: 2018-05-04 | Discharge: 2018-05-04 | Disposition: A | Discharge status: Home / Self Care | Payer: Medicare HMO | Source: Ambulatory Visit | Attending: Internal Medicine | Admitting: Internal Medicine

## 2018-05-04 DIAGNOSIS — Z9889 Other specified postprocedural states: Secondary | ICD-10-CM | POA: Diagnosis not present

## 2018-05-04 DIAGNOSIS — Z950 Presence of cardiac pacemaker: Secondary | ICD-10-CM

## 2018-05-04 DIAGNOSIS — Z79891 Long term (current) use of opiate analgesic: Secondary | ICD-10-CM | POA: Diagnosis not present

## 2018-05-04 DIAGNOSIS — Z79899 Other long term (current) drug therapy: Secondary | ICD-10-CM | POA: Diagnosis not present

## 2018-05-04 DIAGNOSIS — Z951 Presence of aortocoronary bypass graft: Secondary | ICD-10-CM | POA: Diagnosis not present

## 2018-05-04 DIAGNOSIS — Z8673 Personal history of transient ischemic attack (TIA), and cerebral infarction without residual deficits: Secondary | ICD-10-CM | POA: Diagnosis not present

## 2018-05-04 DIAGNOSIS — Z952 Presence of prosthetic heart valve: Secondary | ICD-10-CM

## 2018-05-04 DIAGNOSIS — Z87891 Personal history of nicotine dependence: Secondary | ICD-10-CM | POA: Diagnosis not present

## 2018-05-04 DIAGNOSIS — K219 Gastro-esophageal reflux disease without esophagitis: Secondary | ICD-10-CM | POA: Diagnosis not present

## 2018-05-04 DIAGNOSIS — Z953 Presence of xenogenic heart valve: Secondary | ICD-10-CM | POA: Diagnosis not present

## 2018-05-04 DIAGNOSIS — J449 Chronic obstructive pulmonary disease, unspecified: Secondary | ICD-10-CM | POA: Diagnosis not present

## 2018-05-04 MED FILL — AMOXICILLIN 500 MG CAPSULE: 500 | 2 days supply | Qty: 8 | Fill #0

## 2018-05-09 ENCOUNTER — Other Ambulatory Visit (HOSPITAL_COMMUNITY): Payer: Self-pay

## 2018-05-09 ENCOUNTER — Encounter (HOSPITAL_COMMUNITY)
Admission: RE | Admit: 2018-05-09 | Discharge: 2018-05-09 | Disposition: A | Discharge status: Home / Self Care | Payer: Medicare HMO | Source: Ambulatory Visit | Attending: Internal Medicine | Admitting: Internal Medicine

## 2018-05-09 DIAGNOSIS — Z8673 Personal history of transient ischemic attack (TIA), and cerebral infarction without residual deficits: Secondary | ICD-10-CM | POA: Diagnosis not present

## 2018-05-09 DIAGNOSIS — D509 Iron deficiency anemia, unspecified: Secondary | ICD-10-CM

## 2018-05-09 DIAGNOSIS — Z9889 Other specified postprocedural states: Secondary | ICD-10-CM | POA: Diagnosis not present

## 2018-05-09 DIAGNOSIS — J449 Chronic obstructive pulmonary disease, unspecified: Secondary | ICD-10-CM | POA: Diagnosis not present

## 2018-05-09 DIAGNOSIS — Z87891 Personal history of nicotine dependence: Secondary | ICD-10-CM | POA: Diagnosis not present

## 2018-05-09 DIAGNOSIS — Z951 Presence of aortocoronary bypass graft: Secondary | ICD-10-CM

## 2018-05-09 DIAGNOSIS — K219 Gastro-esophageal reflux disease without esophagitis: Secondary | ICD-10-CM | POA: Diagnosis not present

## 2018-05-09 DIAGNOSIS — Z953 Presence of xenogenic heart valve: Secondary | ICD-10-CM | POA: Diagnosis not present

## 2018-05-09 DIAGNOSIS — Z79899 Other long term (current) drug therapy: Secondary | ICD-10-CM | POA: Diagnosis not present

## 2018-05-09 DIAGNOSIS — Z952 Presence of prosthetic heart valve: Secondary | ICD-10-CM

## 2018-05-09 DIAGNOSIS — Z79891 Long term (current) use of opiate analgesic: Secondary | ICD-10-CM | POA: Diagnosis not present

## 2018-05-11 ENCOUNTER — Encounter (HOSPITAL_COMMUNITY)
Admission: RE | Admit: 2018-05-11 | Discharge: 2018-05-11 | Disposition: A | Discharge status: Home / Self Care | Payer: Medicare HMO | Source: Ambulatory Visit | Attending: Internal Medicine | Admitting: Internal Medicine

## 2018-05-11 DIAGNOSIS — J449 Chronic obstructive pulmonary disease, unspecified: Secondary | ICD-10-CM | POA: Diagnosis not present

## 2018-05-11 DIAGNOSIS — Z79891 Long term (current) use of opiate analgesic: Secondary | ICD-10-CM | POA: Diagnosis not present

## 2018-05-11 DIAGNOSIS — K219 Gastro-esophageal reflux disease without esophagitis: Secondary | ICD-10-CM | POA: Diagnosis not present

## 2018-05-11 DIAGNOSIS — Z952 Presence of prosthetic heart valve: Secondary | ICD-10-CM

## 2018-05-11 DIAGNOSIS — Z9889 Other specified postprocedural states: Secondary | ICD-10-CM | POA: Diagnosis not present

## 2018-05-11 DIAGNOSIS — Z951 Presence of aortocoronary bypass graft: Secondary | ICD-10-CM | POA: Diagnosis not present

## 2018-05-11 DIAGNOSIS — Z8673 Personal history of transient ischemic attack (TIA), and cerebral infarction without residual deficits: Secondary | ICD-10-CM | POA: Diagnosis not present

## 2018-05-11 DIAGNOSIS — Z953 Presence of xenogenic heart valve: Secondary | ICD-10-CM | POA: Diagnosis not present

## 2018-05-11 DIAGNOSIS — Z79899 Other long term (current) drug therapy: Secondary | ICD-10-CM | POA: Diagnosis not present

## 2018-05-11 DIAGNOSIS — Z87891 Personal history of nicotine dependence: Secondary | ICD-10-CM | POA: Diagnosis not present

## 2018-05-14 ENCOUNTER — Encounter (HOSPITAL_COMMUNITY)
Admission: RE | Admit: 2018-05-14 | Discharge: 2018-05-14 | Disposition: A | Discharge status: Home / Self Care | Payer: Medicare HMO | Source: Ambulatory Visit | Attending: Internal Medicine | Admitting: Internal Medicine

## 2018-05-14 ENCOUNTER — Encounter (HOSPITAL_COMMUNITY): Payer: Self-pay

## 2018-05-14 VITALS — Ht 68.5 in | Wt 248.2 lb

## 2018-05-14 DIAGNOSIS — Z952 Presence of prosthetic heart valve: Secondary | ICD-10-CM

## 2018-05-14 DIAGNOSIS — Z953 Presence of xenogenic heart valve: Secondary | ICD-10-CM | POA: Insufficient documentation

## 2018-05-14 DIAGNOSIS — J449 Chronic obstructive pulmonary disease, unspecified: Secondary | ICD-10-CM | POA: Insufficient documentation

## 2018-05-14 DIAGNOSIS — Z950 Presence of cardiac pacemaker: Secondary | ICD-10-CM

## 2018-05-14 DIAGNOSIS — K219 Gastro-esophageal reflux disease without esophagitis: Secondary | ICD-10-CM | POA: Diagnosis not present

## 2018-05-14 DIAGNOSIS — Z9889 Other specified postprocedural states: Secondary | ICD-10-CM | POA: Diagnosis not present

## 2018-05-14 DIAGNOSIS — Z79899 Other long term (current) drug therapy: Secondary | ICD-10-CM | POA: Diagnosis not present

## 2018-05-14 DIAGNOSIS — Z79891 Long term (current) use of opiate analgesic: Secondary | ICD-10-CM | POA: Insufficient documentation

## 2018-05-14 DIAGNOSIS — Z87891 Personal history of nicotine dependence: Secondary | ICD-10-CM | POA: Insufficient documentation

## 2018-05-14 DIAGNOSIS — Z951 Presence of aortocoronary bypass graft: Secondary | ICD-10-CM | POA: Diagnosis not present

## 2018-05-14 DIAGNOSIS — Z8673 Personal history of transient ischemic attack (TIA), and cerebral infarction without residual deficits: Secondary | ICD-10-CM | POA: Insufficient documentation

## 2018-05-21 ENCOUNTER — Other Ambulatory Visit: Payer: Self-pay

## 2018-05-21 ENCOUNTER — Ambulatory Visit (HOSPITAL_COMMUNITY)
Admission: RE | Admit: 2018-05-21 | Discharge: 2018-05-21 | Disposition: A | Discharge status: Home / Self Care | Payer: Medicare HMO | Source: Ambulatory Visit | Attending: Cardiology | Admitting: Cardiology

## 2018-05-21 VITALS — BP 130/84 | HR 59 | Wt 250.0 lb

## 2018-05-21 DIAGNOSIS — K219 Gastro-esophageal reflux disease without esophagitis: Secondary | ICD-10-CM | POA: Insufficient documentation

## 2018-05-21 DIAGNOSIS — Z8673 Personal history of transient ischemic attack (TIA), and cerebral infarction without residual deficits: Secondary | ICD-10-CM | POA: Diagnosis not present

## 2018-05-21 DIAGNOSIS — Z951 Presence of aortocoronary bypass graft: Secondary | ICD-10-CM

## 2018-05-21 DIAGNOSIS — Z79899 Other long term (current) drug therapy: Secondary | ICD-10-CM | POA: Insufficient documentation

## 2018-05-21 DIAGNOSIS — E785 Hyperlipidemia, unspecified: Secondary | ICD-10-CM

## 2018-05-21 DIAGNOSIS — J449 Chronic obstructive pulmonary disease, unspecified: Secondary | ICD-10-CM | POA: Insufficient documentation

## 2018-05-21 DIAGNOSIS — D509 Iron deficiency anemia, unspecified: Secondary | ICD-10-CM | POA: Diagnosis not present

## 2018-05-21 DIAGNOSIS — I5022 Chronic systolic (congestive) heart failure: Secondary | ICD-10-CM | POA: Diagnosis present

## 2018-05-21 DIAGNOSIS — M109 Gout, unspecified: Secondary | ICD-10-CM | POA: Diagnosis not present

## 2018-05-21 DIAGNOSIS — I2510 Atherosclerotic heart disease of native coronary artery without angina pectoris: Secondary | ICD-10-CM | POA: Insufficient documentation

## 2018-05-21 DIAGNOSIS — Z953 Presence of xenogenic heart valve: Secondary | ICD-10-CM | POA: Diagnosis not present

## 2018-05-21 DIAGNOSIS — H9190 Unspecified hearing loss, unspecified ear: Secondary | ICD-10-CM | POA: Insufficient documentation

## 2018-05-21 DIAGNOSIS — Z87891 Personal history of nicotine dependence: Secondary | ICD-10-CM | POA: Diagnosis not present

## 2018-05-21 DIAGNOSIS — I080 Rheumatic disorders of both mitral and aortic valves: Secondary | ICD-10-CM | POA: Diagnosis not present

## 2018-05-21 DIAGNOSIS — I4892 Unspecified atrial flutter: Secondary | ICD-10-CM | POA: Insufficient documentation

## 2018-05-21 DIAGNOSIS — Z950 Presence of cardiac pacemaker: Secondary | ICD-10-CM | POA: Insufficient documentation

## 2018-05-21 DIAGNOSIS — N183 Chronic kidney disease, stage 3 (moderate): Secondary | ICD-10-CM | POA: Diagnosis not present

## 2018-05-21 DIAGNOSIS — I5042 Chronic combined systolic (congestive) and diastolic (congestive) heart failure: Secondary | ICD-10-CM | POA: Diagnosis not present

## 2018-05-21 DIAGNOSIS — I480 Paroxysmal atrial fibrillation: Secondary | ICD-10-CM | POA: Insufficient documentation

## 2018-05-21 DIAGNOSIS — I130 Hypertensive heart and chronic kidney disease with heart failure and stage 1 through stage 4 chronic kidney disease, or unspecified chronic kidney disease: Secondary | ICD-10-CM | POA: Diagnosis not present

## 2018-05-21 DIAGNOSIS — Z7901 Long term (current) use of anticoagulants: Secondary | ICD-10-CM | POA: Diagnosis not present

## 2018-05-21 LAB — COMPREHENSIVE METABOLIC PANEL
ALK PHOS: 35 U/L — AB (ref 38–126)
ALT: 13 U/L — AB (ref 17–63)
ANION GAP: 10 (ref 5–15)
AST: 21 U/L (ref 15–41)
Albumin: 4.2 g/dL (ref 3.5–5.0)
BUN: 26 mg/dL — ABNORMAL HIGH (ref 6–20)
CALCIUM: 9.4 mg/dL (ref 8.9–10.3)
CO2: 23 mmol/L (ref 22–32)
CREATININE: 1.71 mg/dL — AB (ref 0.61–1.24)
Chloride: 107 mmol/L (ref 101–111)
GFR, EST AFRICAN AMERICAN: 44 mL/min — AB (ref >60)
GFR, EST NON AFRICAN AMERICAN: 38 mL/min — AB (ref >60)
Glucose, Bld: 96 mg/dL (ref 65–99)
Potassium: 4.7 mmol/L (ref 3.5–5.1)
Sodium: 140 mmol/L (ref 135–145)
Total Bilirubin: 0.6 mg/dL (ref 0.3–1.2)
Total Protein: 7.6 g/dL (ref 6.5–8.1)

## 2018-05-21 LAB — TSH: TSH: 5.876 u[IU]/mL — AB (ref 0.350–4.500)

## 2018-05-21 MED ORDER — CARVEDILOL 6.25 MG PO TABS: 9.3750 mg | ORAL_TABLET | Freq: Two times a day (BID) | ORAL | 3 refills | Status: DC

## 2018-05-21 MED ORDER — SPIRONOLACTONE 25 MG PO TABS: 12.5000 mg | ORAL_TABLET | Freq: Every day | ORAL | 2 refills | Status: DC

## 2018-05-21 NOTE — Patient Instructions (Addendum)
Start Spironolactone 12.5 mg (1/2 tab) daily   Start Carvedilol 9.375 mg (1.5 tabs), twice a day  Your physician has requested that you have an echocardiogram. Echocardiography is a painless test that uses sound waves to create images of your heart. It provides your doctor with information about the size and shape of your heart and how well your heart's chambers and valves are working. This procedure takes approximately one hour. There are no restrictions for this procedure. (they will call you)    Labs drawn today (if we do not call you, then your lab work was stable)   Your physician recommends that you return for lab work in: 10 days  Your physician recommends that you schedule a follow-up appointment in: 2 months with Dr. Aundra Dubin

## 2018-05-21 NOTE — Progress Notes (Signed)
Advanced Heart Failure Clinic Note   PCP: Dr. Justin Mend Cardiology: Dr. Radford Pax HF Cardiology: Dr. Aundra Dubin  72 y.o.with history of CVA, paroxysmal atrial fibrillation, valvular heart disease, and chronic diastolic CHF presents for evaluation of CHF and valvular heart disease.    He had had episodes of dyspnea and initial workup led to a TEE in 5/18 showing normal EF with moderate AS and moderate MR.  He continued to have episodic dyspnea and ended up admitted in 8/18 with shortness of breath and chest pressure.  This led to a long, complicated hospitalization.  He was noted to be volume overloaded with acute diastolic CHF and was also noted to have symptomatic runs of atrial fibrillation with RVR.  Amiodarone was started to control the atrial fibrillation.  He developed respiratory distress => Bipap => intubated.  He became hypotensive, requiring pressors.  He developed AKI.  PNA was noted and he received broad spectrum abx.  He had a prolonged intubation but was eventually extubated.  Given deconditioning from long hospitalization, he went to CIR for a couple of weeks.  LHC in 9/18 showed occluded PDA with left>right collaterals.  TEE in 9/18 showed EF 50%, severe MR (possibly infarct-related with restricted posterior leaflet, and moderate AS + moderate-severe AI (possibly rheumatic).   In 12/18, he had cardiac surgery with bioprosthetic aortic and mitral valves placed.  He had SVG-PDA, Maze, and LA appendage clipping.  Post-op course was complicated by CHB requiring Medtronic dual chamber PPM (His bundle lead placed but captured right bundle so induced LBBB).   Echo 01/02/18 LVEF 30-35%, bioprosthetic MV and AoV function normally, RV mildly dilated with normal systolic function.    He was noted to be in atrial flutter with mild RVR and had TEE-guided DCCV on 04/11/18. TEE showed EF 25-30%, normal bioprosthetic aortic and mitral valves.   He returns today for followup of CHF and valvular disease.   Pacemaker has been adjusted to decrease RV pacing.  He is doing well today. He is in NSR.  He has finished cardiac rehab. No significant dyspnea walking on flat ground.  He was able to play a round of golf without problems.  He can climb a flight of stairs.  No chest pain.  No lightheadedness or palpitations.  Weight stable.   Labs (9/18): K 4.5, creatinine 1.42, LDL 90, HDL 36, LFTs normal, TSH normal, hgb 9.6 Labs (10/18): K 4.3, creatinine 1.45 Labs (12/18): K 3.8, creatinine 1.56 Labs (1/19): creatinine 1.68, LDL 66 Labs (3/19): K 4.2, creatinine 1.56 Labs (4/19): K 4.4, creatinine 1.71, hgb 11.8, LDL 75, HDL 43  ECG (personally reviewed): a-paced, old ASMI  PMH: 1. CVA: Right MCA, 2004.  Minimal residual problems.  2. HTN 3. Hyperlipidemia 4. Left rotator cuff surgery 6/18 5. Carotid stenosis: s/p right carotid stent.  6. GERD 7. H/o CCY 8. Anemia: FOBT negative.  9. Gout  10. Atrial fibrillation: Paroxysmal. Maze in 12/18 with LAA clipping.  11. Valvular heart disease: TEE (5/18) with EF 55-60%, moderate AS mean 33 mmHg, moderate MR, normal RV size and systolic function.  - Echo (8/18): EF 55-60%, moderate LVH, moderate AS with mean gradient 33 mmHg, moderate AI, moderate to severe MR.  - TEE (9/18): EF 50%, mild LV dilation, suspect severe MR with posterior leaflet restricted (?infarct-related MR), ERO 0.4 cm^2, moderate AS/moderate-severe AI (possibly rheumatic).   - In 12/18, he had cardiac surgery with bioprosthetic aortic and mitral valves placed.  He had SVG-PDA, Maze, and LA appendage clipping. -  Echo (1/19): LVEF 30-35%, bioprosthetic MV and AoV function normally, RV mildly dilated with normal systolic function.   - TEE (5/19) with EF 25-30%, normal bioprosthetic aortic valve, normal bioprosthetic mitral valve. 12. Chronic diastolic CHF 13. Deafness 14. CKD: stage 3.  15. Suspect COPD 16. 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  17. COPD: Mild obstruction on 9/18 PFTs.  18. Post-op CHB in 12/18 with Medtronic dual chamber PPM.  19. Chronic systolic CHF: Echo (2/50) with EF down to 30-35% post-op.  - TEE (5/19) with EF 25-30%, normal bioprosthetic aortic valve, normal bioprosthetic mitral valve.  20. Atrial flutter: DCCV in 5/19.   SH: Quit smoking 8/18.  Married, 2 children, retired.    Family History  Problem Relation Age of Onset  . Stroke Father        No details  . Angina Mother    Review of systems complete and found to be negative unless listed in HPI.    Current Outpatient Medications  Medication Sig Dispense Refill  . acetaminophen (TYLENOL) 325 MG tablet Take 1 tablet (325 mg total) by mouth every 6 (six) hours as needed for mild pain. 30 tablet 0  . amiodarone (PACERONE) 200 MG tablet Take 1 tablet (200 mg total) by mouth daily. 60 tablet 3  . calcium carbonate (TUMS - DOSED IN MG ELEMENTAL CALCIUM) 500 MG chewable tablet Chew 1 tablet by mouth daily as needed for indigestion or heartburn.    . carvedilol (COREG) 6.25 MG tablet Take 1.5 tablets (9.375 mg total) by mouth 2 (two) times daily. 90 tablet 3  . cetirizine (ZYRTEC) 10 MG tablet Take 10 mg by mouth daily as needed for allergies.    . citalopram (CELEXA) 20 MG tablet Take 1 tablet (20 mg total) by mouth at bedtime. 30 tablet 5  . docusate sodium (COLACE) 100 MG capsule Take 1 capsule (100 mg total) by mouth 3 (three) times daily as needed. 20 capsule 0  . furosemide (LASIX) 40 MG tablet Take 60 mg (1.5 tabs), alternating with 40 mg (1 tab) every other day 75 tablet 3  . ipratropium (ATROVENT) 0.03 % nasal spray Place 2 sprays into both nostrils 2 (two) times daily.     [name] LORazepam (ATIVAN) 0.5 MG tablet Take 1 tablet (0.5 mg total) by mouth every 6 (six) hours as needed for anxiety (aggitation). 20 tablet 0  . lovastatin (MEVACOR) 40 MG tablet TAKE 1 TABLET AT  BEDTIME  (NEW  DOSE) 90 tablet 3  . ondansetron (ZOFRAN) 4 MG tablet Take 4 mg by mouth every 8 (eight) hours as needed for nausea or vomiting.    . pantoprazole (PROTONIX) 40 MG tablet Take 1 tablet (40 mg total) by mouth daily. 30 tablet 1  . sacubitril-valsartan (ENTRESTO) 24-26 MG Take 1 tablet by mouth 2 (two) times daily. 180 tablet 3  . traZODone (DESYREL) 50 MG tablet Take 50 mg by mouth at bedtime.     [name] warfarin (COUMADIN) 5 MG tablet Take 1 tablet (5 mg total) by mouth daily at 6 PM. 40 tablet 3  . spironolactone (ALDACTONE) 25 MG tablet Take 0.5 tablets (12.5 mg total) by mouth daily. 15 tablet 2   No current facility-administered medications for this encounter.    Vitals:   05/21/18 1043  BP: 130/84  Pulse: (!) 59  SpO2: 95%  Weight: 250 lb (113.4  kg)   Wt Readings from Last 3 Encounters:  05/21/18 250 lb (113.4 kg)  05/16/18 248 lb 3.8 oz (112.6 kg)  04/11/18 249 lb (112.9 kg)   General: NAD Neck: No JVD, no thyromegaly or thyroid nodule.  Lungs: Slight crackles at bases.  CV: Nondisplaced PMI.  Heart regular S1/S2, no S3/S4, 1/6 SEM RUSB.  No peripheral edema.  No carotid bruit.  Normal pedal pulses.  Abdomen: Soft, nontender, no hepatosplenomegaly, no distention.  Skin: Intact without lesions or rashes.  Neurologic: Alert and oriented x 3.  Psych: Normal affect. Extremities: No clubbing or cyanosis.  HEENT: Normal.   Assessment/Plan: 1. Valvular heart disease: TEE in 9/18 showed severe MR, probably infarct-related with restrictive posterior mitral leaflet. There was moderate aortic stenosis and moderate-severe aortic insufficiency, possible rheumatic.  In 12/18, he had bioprosthetic MVR and AVR.  - Valves stable on 5/19 TEE.  2. Chronic combined CHF: In setting of significant valvular disease as above. EF down to 30-35% post-op (1/19 echo), likely reflects true LV systolic function without the volume load from severe MR and moderate-severe AI.  TEE in 5/19 showed  EF 25-30%.  He is not volume overloaded on exam.  NYHA class II symptoms.  - Continue Lasix 60 qam/40 qpm.  BMET today.    - Increase Coreg to 9.375 mg bid.  - Continue Entresto 24/26 bid.  - Start spironolactone 12.5 mg daily.  BMET in 10 days.  - He is a-pacing today but not v-pacing.   - I will work on medication adjustment for another 3 months.  Repeat echo at that time and if EF < 35%, will need to consider ICD.    3. CKD: Stage 3. BMET today.  4. Atrial fibrillation/flutter: Paroxysmal. He has had a Maze procedure.  He tends to tolerate atrial arrhythmias poorly.  Most recently, he had atrial flutter with DCCV in 5/19.  He is now maintaining NSR.  - He is on warfarin.  - Continue amiodarone for now, I will stop it if he maintains NSR for a couple more months (at my next appointment with him).  Meanwhile, need to check LFTs and TSH.  He will need a regular eye exam.  5. COPD: Mild obstruction on PFTs.  He has quit smoking.  Stable. No change.   6. CVA: Remote, minimal residual. S/p carotid stenting.  - No ASA given warfarin use.  7. Hyperlipidemia: Good lipids 4/19.    8. Anemia: Fe deficiency.  FOBT negative 8/18.  Got feraheme in 8/18.   9. 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  The artery to the PDA was occluded with left to right collaterals.  He had SVG-PDA in 12/18. No chest pain.  - Continue statin.   - No ASA given warfarin use.  10. CHB: has Medtronic PPM.  Minimal RV pacing now.   Followup in 3 wks in pharmacy clinic for medication titration.  Followup in 2 months with me.    Loralie Champagne 05/21/2018

## 2018-05-22 ENCOUNTER — Ambulatory Visit (INDEPENDENT_AMBULATORY_CARE_PROVIDER_SITE_OTHER): Payer: Medicare HMO

## 2018-05-22 DIAGNOSIS — Z953 Presence of xenogenic heart valve: Secondary | ICD-10-CM | POA: Diagnosis not present

## 2018-05-22 DIAGNOSIS — Z9889 Other specified postprocedural states: Secondary | ICD-10-CM | POA: Diagnosis not present

## 2018-05-22 DIAGNOSIS — Z5181 Encounter for therapeutic drug level monitoring: Secondary | ICD-10-CM

## 2018-05-22 DIAGNOSIS — Z951 Presence of aortocoronary bypass graft: Secondary | ICD-10-CM

## 2018-05-22 DIAGNOSIS — I480 Paroxysmal atrial fibrillation: Secondary | ICD-10-CM | POA: Diagnosis not present

## 2018-05-22 DIAGNOSIS — Z8679 Personal history of other diseases of the circulatory system: Secondary | ICD-10-CM | POA: Diagnosis not present

## 2018-05-22 LAB — POCT INR: INR: 2.6 (ref 2.0–3.0)

## 2018-05-22 NOTE — Patient Instructions (Signed)
Description   Continue taking 1 tablet daily except 1.5 tablets on Mondays.  Recheck INR in 4 weeks.  Call with any new medications scheduled procedures or any questions  [phone]

## 2018-05-24 ENCOUNTER — Other Ambulatory Visit: Payer: Self-pay | Admitting: Cardiology

## 2018-05-24 NOTE — Addendum Note (Signed)
Encounter addended by: Noel Christmas, RN on: 05/24/2018 12:26 PM  Actions taken: Episode resolved

## 2018-05-24 NOTE — Progress Notes (Signed)
Discharge Progress Report  Patient Details  Name: Brandon [name] MRN: [id number] Date of Birth: [birth year] Referring Provider:     CARDIAC REHAB PHASE II ORIENTATION from 02/01/2018 in [place]  Referring Provider  Wyatt Haste MD and Loralie Champagne MD       Number of Visits: 36  Reason for Discharge:  Patient reached a stable level of exercise. Patient independent in their exercise. Patient has met program and personal goals.  Smoking History:  Social History   Tobacco Use  Smoking Status Former Smoker  . Packs/day: 0.50  . Years: 57.00  . Pack years: 28.50  . Types: Cigarettes  . Last attempt to quit: 07/13/1997  . Years since quitting: 20.8  Smokeless Tobacco Never Used    Diagnosis:  S/P AVR (aortic valve replacement)  S/P MVR (mitral valve replacement)  S/P CABG x 1  Pacemaker  ADL UCSD:   Initial Exercise Prescription: Initial Exercise Prescription - 02/01/18 1100      Date of Initial Exercise RX and Referring Provider   Date  02/01/18    Referring Provider  Wyatt Haste MD and Loralie Champagne MD      Bike   Level  0.3    Minutes  10    METs  1.51      NuStep   Level  2    SPM  70    Minutes  10    METs  1.5      Track   Laps  7    Minutes  10    METs  2.23      Prescription Details   Frequency (times per week)  3    Duration  Progress to 30 minutes of continuous aerobic without signs/symptoms of physical distress      Intensity   THRR 40-80% of Max Heartrate  60-119    Ratings of Perceived Exertion  11-13    Perceived Dyspnea  0-4      Progression   Progression  Continue to progress workloads to maintain intensity without signs/symptoms of physical distress.      Resistance Training   Training Prescription  Yes    Weight  2lbs    Reps  10-15       Discharge Exercise Prescription (Final Exercise Prescription Changes): Exercise Prescription Changes - 05/14/18 1359      Response to Exercise   Blood Pressure (Admit)  118/70    Blood Pressure (Exercise)  122/64    Blood Pressure (Exit)  100/68    Heart Rate (Admit)  60 bpm    Heart Rate (Exercise)  74 bpm    Heart Rate (Exit)  61 bpm    Rating of Perceived Exertion (Exercise)  10    Perceived Dyspnea (Exercise)  0    Symptoms  None    Comments  Pt completed Cardiac Rehab    Duration  Progress to 45 minutes of aerobic exercise without signs/symptoms of physical distress    Intensity  THRR unchanged      Progression   Progression  Continue to progress workloads to maintain intensity without signs/symptoms of physical distress.    Average METs  3.45      Resistance Training   Training Prescription  Yes    Weight  4lbs    Reps  10-15    Time  10 Minutes      Interval Training   Interval Training  No      Bike  Level  1.5    Minutes  15    METs  3.5      NuStep   Level  4    SPM  85    Minutes  15    METs  3.4      Home Exercise Plan   Plans to continue exercise at  Home (comment)    Frequency  Add 2 additional days to program exercise sessions. Walking    Initial Home Exercises Provided  02/21/18       Functional Capacity: 6 Minute Walk    Row Name 02/01/18 1146 05/16/18 1352       6 Minute Walk   Phase  Initial  Discharge    Distance  1322 feet  1488 feet    Distance % Change  -  12.56 %    Distance Feet Change  -  166 ft    Walk Time  6 minutes  6 minutes    # of Rest Breaks  0  0    MPH  2.5  2.82    METS  1.6  2.49    RPE  11  11    Perceived Dyspnea   -  0    VO2 Peak  5.7  8.7    Symptoms  -  Yes (comment)    Comments  -  Right Knee Pain +7    Resting HR  62 bpm  63 bpm    Resting BP  128/70  118/64    Resting Oxygen Saturation   96 %  -    Exercise Oxygen Saturation  during 6 min walk  100 %  -    Max Ex. HR  92 bpm  97 bpm    Max Ex. BP  128/74  126/72    2 Minute Post BP  118/68  104/68       Psychological, QOL, Others - Outcomes: PHQ 2/9: Depression screen [facility] 2/9 05/14/2018  02/05/2018  Decreased Interest 0 0  Down, Depressed, Hopeless 0 0  PHQ - 2 Score 0 0  Some recent data might be hidden    Quality of Life: Quality of Life - 05/16/18 1357      Quality of Life Scores   Health/Function Pre  24.9 %    Health/Function Post  24.73 %    Health/Function % Change  -0.68 %    Socioeconomic Pre  28.19 %    Socioeconomic Post  27.86 %    Socioeconomic % Change   -1.17 %    Psych/Spiritual Pre  26.14 %    Psych/Spiritual Post  27.33 %    Psych/Spiritual % Change  4.55 %    Family Pre  24 %    Family Post  24.9 %    Family % Change  3.75 %    GLOBAL Pre  25.77 %    GLOBAL Post  25.89 %    GLOBAL % Change  0.47 %       Personal Goals: Goals established at orientation with interventions provided to work toward goal. Personal Goals and Risk Factors at Admission - 02/01/18 1154      Core Components/Risk Factors/Patient Goals on Admission   Tobacco Cessation  Yes quit date 07/2017    Number of packs per day  1/2 ppd x 50 years    Intervention  Assist the participant in steps to quit. Provide individualized education and counseling about committing to Tobacco Cessation, relapse prevention, and pharmacological support  that can be provided by physician.;Advice worker, assist with locating and accessing local/national Quit Smoking programs, and support quit date choice.    Expected Outcomes  Short Term: Will demonstrate readiness to quit, by selecting a quit date.;Short Term: Will quit all tobacco product use, adhering to prevention of relapse plan.;Long Term: Complete abstinence from all tobacco products for at least 12 months from quit date.    Heart Failure  Yes    Intervention  Provide a combined exercise and nutrition program that is supplemented with education, support and counseling about heart failure. Directed toward relieving symptoms such as shortness of breath, decreased exercise tolerance, and extremity edema.    Expected Outcomes   Improve functional capacity of life;Short term: Attendance in program 2-3 days a week with increased exercise capacity. Reported lower sodium intake. Reported increased fruit and vegetable intake. Reports medication compliance.;Short term: Daily weights obtained and reported for increase. Utilizing diuretic protocols set by physician.;Long term: Adoption of self-care skills and reduction of barriers for early signs and symptoms recognition and intervention leading to self-care maintenance.    Hypertension  Yes    Intervention  Provide education on lifestyle modifcations including regular physical activity/exercise, weight management, moderate sodium restriction and increased consumption of fresh fruit, vegetables, and low fat dairy, alcohol moderation, and smoking cessation.;Monitor prescription use compliance.    Expected Outcomes  Short Term: Continued assessment and intervention until BP is < 140/43m HG in hypertensive participants. < 130/845mHG in hypertensive participants with diabetes, heart failure or chronic kidney disease.;Long Term: Maintenance of blood pressure at goal levels.    Lipids  Yes    Intervention  Provide education and support for participant on nutrition & aerobic/resistive exercise along with prescribed medications to achieve LDL <7019mHDL >30m66m  Expected Outcomes  Short Term: Participant states understanding of desired cholesterol values and is compliant with medications prescribed. Participant is following exercise prescription and nutrition guidelines.;Long Term: Cholesterol controlled with medications as prescribed, with individualized exercise RX and with personalized nutrition plan. Value goals: LDL < 70mg54mL > 40 mg.        Personal Goals Discharge: Goals and Risk Factor Review    Row Name 03/29/18 1109 04/26/18 1404 05/14/18 1634         Core Components/Risk Factors/Patient Goals Review   Personal Goals Review  Weight Management/Obesity;Heart  Failure;Hypertension;Lipids  Weight Management/Obesity;Heart Failure;Hypertension;Lipids  Weight Management/Obesity;Heart Failure;Hypertension;Lipids     Review  Victoriano's blood pressures have been maintaining within parameters defined by MD. JimmyLaverna Peacebeen doing well with exercise and has been asymptomatic..  Owais's vitals have been maintaining lately.  JimmyKalybaintaining with slow but steady progress.  JimmyJaysebeen attending education classes and feels that he is learning.  Zaccary completed 36 sessions.  He felt that participating in the program helped increased his strength.      Expected Outcomes  JimmyDave continue to participate in CR exercise, nutrition, and lifestyle modification opportunities.   JimmyKeefe continue to participate in CR exercise, nutrition, and lifestyle modification opportunities.   JimmyDoron continue to participate in CR exercise, nutrition, and lifestyle modification opportunities.  He plans to walk and participate in [employer]     Exercise Goals and Review: Exercise Goals    Row Name 02/01/18 0937             Exercise Goals   Increase Physical Activity  Yes       Intervention  Provide advice, education, support and counseling about physical activity/exercise needs.;Develop an individualized exercise prescription for aerobic and resistive training based on initial evaluation findings, risk stratification, comorbidities and participant's personal goals.       Expected Outcomes  Short Term: Attend rehab on a regular basis to increase amount of physical activity.;Long Term: Add in home exercise to make exercise part of routine and to increase amount of physical activity.;Long Term: Exercising regularly at least 3-5 days a week.       Increase Strength and Stamina  Yes       Intervention  Provide advice, education, support and counseling about physical activity/exercise needs.;Develop an individualized exercise prescription for aerobic and resistive training  based on initial evaluation findings, risk stratification, comorbidities and participant's personal goals.       Expected Outcomes  Short Term: Increase workloads from initial exercise prescription for resistance, speed, and METs.;Short Term: Perform resistance training exercises routinely during rehab and add in resistance training at home;Long Term: Improve cardiorespiratory fitness, muscular endurance and strength as measured by increased METs and functional capacity (6MWT)       Able to understand and use rate of perceived exertion (RPE) scale  Yes       Intervention  Provide education and explanation on how to use RPE scale       Expected Outcomes  Short Term: Able to use RPE daily in rehab to express subjective intensity level;Long Term:  Able to use RPE to guide intensity level when exercising independently       Knowledge and understanding of Target Heart Rate Range (THRR)  Yes       Intervention  Provide education and explanation of THRR including how the numbers were predicted and where they are located for reference       Expected Outcomes  Short Term: Able to state/look up THRR;Short Term: Able to use daily as guideline for intensity in rehab;Long Term: Able to use THRR to govern intensity when exercising independently       Able to check pulse independently  Yes       Intervention  Provide education and demonstration on how to check pulse in carotid and radial arteries.;Review the importance of being able to check your own pulse for safety during independent exercise       Expected Outcomes  Short Term: Able to explain why pulse checking is important during independent exercise;Long Term: Able to check pulse independently and accurately       Understanding of Exercise Prescription  Yes       Intervention  Provide education, explanation, and written materials on patient's individual exercise prescription       Expected Outcomes  Short Term: Able to explain program exercise prescription;Long  Term: Able to explain home exercise prescription to exercise independently          Nutrition & Weight - Outcomes: Pre Biometrics - 02/01/18 1153      Pre Biometrics   Height  5' 8.5" (1.74 m)    Weight  242 lb 1 oz (109.8 kg)    Waist Circumference  48 inches    Hip Circumference  45 inches    Waist to Hip Ratio  1.07 %    BMI (Calculated)  36.27    Triceps Skinfold  30 mm    % Body Fat  45.2 %    Grip Strength  30 kg    Flexibility  8 in    Single Leg Stand  30 seconds  Post Biometrics - 05/16/18 1356       Post  Biometrics   Height  5' 8.5" (1.74 m)    Weight  248 lb 3.8 oz (112.6 kg)    Waist Circumference  51 inches    Hip Circumference  47 inches    Waist to Hip Ratio  1.09 %    BMI (Calculated)  37.19    Triceps Skinfold  29 mm    % Body Fat  39.1 %    Grip Strength  31 kg    Flexibility  11 in    Single Leg Stand  27 seconds       Nutrition: Nutrition Therapy & Goals - 02/12/18 1209      Nutrition Therapy   Diet  Heart Healthy    Drug/Food Interactions  Coumadin/Vit K      Personal Nutrition Goals   Nutrition Goal  Pt to identify and limit food sources of saturated fat, trans fat, and sodium    Personal Goal #2  Pt to identify food quantities necessary to achieve weight loss of 6-24 lb (2.7-10.9 kg) at graduation from cardiac rehab. Goal wt of 200 lb desired.       Intervention Plan   Intervention  Prescribe, educate and counsel regarding individualized specific dietary modifications aiming towards targeted core components such as weight, hypertension, lipid management, diabetes, heart failure and other comorbidities.;Nutrition handout(s) given to patient. Pre-diabetes    Expected Outcomes  Short Term Goal: Understand basic principles of dietary content, such as calories, fat, sodium, cholesterol and nutrients.;Long Term Goal: Adherence to prescribed nutrition plan.       Nutrition Discharge: Nutrition Assessments - 05/21/18 1451      MEDFICTS  Scores   Pre Score  48    Post Score  80    Score Difference  32       Education Questionnaire Score: Knowledge Questionnaire Score - 05/16/18 1356      Knowledge Questionnaire Score   Pre Score  22/24    Post Score  24/24       Goals reviewed with patient; copy given to patient.

## 2018-05-25 ENCOUNTER — Other Ambulatory Visit: Payer: Self-pay

## 2018-05-25 ENCOUNTER — Ambulatory Visit (HOSPITAL_COMMUNITY): Payer: Medicare HMO | Attending: Cardiology

## 2018-05-25 DIAGNOSIS — I4891 Unspecified atrial fibrillation: Secondary | ICD-10-CM | POA: Diagnosis not present

## 2018-05-25 DIAGNOSIS — I110 Hypertensive heart disease with heart failure: Secondary | ICD-10-CM | POA: Insufficient documentation

## 2018-05-25 DIAGNOSIS — Z48812 Encounter for surgical aftercare following surgery on the circulatory system: Secondary | ICD-10-CM | POA: Insufficient documentation

## 2018-05-25 DIAGNOSIS — J449 Chronic obstructive pulmonary disease, unspecified: Secondary | ICD-10-CM | POA: Insufficient documentation

## 2018-05-25 DIAGNOSIS — E785 Hyperlipidemia, unspecified: Secondary | ICD-10-CM | POA: Insufficient documentation

## 2018-05-25 DIAGNOSIS — I77810 Thoracic aortic ectasia: Secondary | ICD-10-CM | POA: Diagnosis not present

## 2018-05-25 DIAGNOSIS — I252 Old myocardial infarction: Secondary | ICD-10-CM | POA: Insufficient documentation

## 2018-05-25 DIAGNOSIS — Z951 Presence of aortocoronary bypass graft: Secondary | ICD-10-CM | POA: Diagnosis not present

## 2018-05-25 DIAGNOSIS — Z954 Presence of other heart-valve replacement: Secondary | ICD-10-CM | POA: Diagnosis not present

## 2018-05-25 DIAGNOSIS — I2510 Atherosclerotic heart disease of native coronary artery without angina pectoris: Secondary | ICD-10-CM | POA: Diagnosis not present

## 2018-05-25 DIAGNOSIS — I340 Nonrheumatic mitral (valve) insufficiency: Secondary | ICD-10-CM | POA: Insufficient documentation

## 2018-05-25 DIAGNOSIS — I639 Cerebral infarction, unspecified: Secondary | ICD-10-CM | POA: Diagnosis not present

## 2018-05-25 DIAGNOSIS — I5022 Chronic systolic (congestive) heart failure: Secondary | ICD-10-CM | POA: Diagnosis not present

## 2018-05-25 MED ORDER — PERFLUTREN LIPID MICROSPHERE
1.0000 mL | INTRAVENOUS | Status: AC | PRN
  Administered 2018-05-25: 3 mL via INTRAVENOUS

## 2018-05-28 ENCOUNTER — Ambulatory Visit (HOSPITAL_COMMUNITY)
Admission: RE | Admit: 2018-05-28 | Discharge: 2018-05-28 | Disposition: A | Discharge status: Home / Self Care | Payer: Medicare HMO | Source: Ambulatory Visit | Attending: Cardiology | Admitting: Cardiology

## 2018-05-28 DIAGNOSIS — D509 Iron deficiency anemia, unspecified: Secondary | ICD-10-CM

## 2018-05-28 DIAGNOSIS — E785 Hyperlipidemia, unspecified: Secondary | ICD-10-CM

## 2018-05-28 DIAGNOSIS — I5022 Chronic systolic (congestive) heart failure: Secondary | ICD-10-CM

## 2018-05-28 DIAGNOSIS — I480 Paroxysmal atrial fibrillation: Secondary | ICD-10-CM | POA: Diagnosis not present

## 2018-05-28 LAB — HEPATIC FUNCTION PANEL
ALK PHOS: 34 U/L — AB (ref 38–126)
ALT: 14 U/L — AB (ref 17–63)
AST: 21 U/L (ref 15–41)
Albumin: 4 g/dL (ref 3.5–5.0)
Total Bilirubin: 0.6 mg/dL (ref 0.3–1.2)
Total Protein: 7.1 g/dL (ref 6.5–8.1)

## 2018-05-28 LAB — BASIC METABOLIC PANEL
ANION GAP: 11 (ref 5–15)
BUN: 31 mg/dL — AB (ref 6–20)
CALCIUM: 8.9 mg/dL (ref 8.9–10.3)
CO2: 21 mmol/L — ABNORMAL LOW (ref 22–32)
Chloride: 106 mmol/L (ref 101–111)
Creatinine, Ser: 2.02 mg/dL — ABNORMAL HIGH (ref 0.61–1.24)
GFR calc Af Amer: 36 mL/min — ABNORMAL LOW (ref >60)
GFR calc non Af Amer: 31 mL/min — ABNORMAL LOW (ref >60)
Glucose, Bld: 107 mg/dL — ABNORMAL HIGH (ref 65–99)
Potassium: 4.5 mmol/L (ref 3.5–5.1)
Sodium: 138 mmol/L (ref 135–145)

## 2018-05-28 LAB — CBC
HCT: 40.4 % (ref 39.0–52.0)
Hemoglobin: 13.4 g/dL (ref 13.0–17.0)
MCH: 31.9 pg (ref 26.0–34.0)
MCHC: 33.2 g/dL (ref 30.0–36.0)
MCV: 96.2 fL (ref 78.0–100.0)
PLATELETS: 178 10*3/uL (ref 150–400)
RBC: 4.2 MIL/uL — ABNORMAL LOW (ref 4.22–5.81)
RDW: 13.6 % (ref 11.5–15.5)
WBC: 6 10*3/uL (ref 4.0–10.5)

## 2018-05-28 LAB — LIPID PANEL
Cholesterol: 164 mg/dL (ref 0–200)
HDL: 40 mg/dL — ABNORMAL LOW (ref >40)
LDL Cholesterol: 75 mg/dL (ref 0–99)
Total CHOL/HDL Ratio: 4.1 RATIO
Triglycerides: 246 mg/dL — ABNORMAL HIGH (ref <150)
VLDL: 49 mg/dL — ABNORMAL HIGH (ref 0–40)

## 2018-05-28 LAB — IRON AND TIBC
Iron: 106 ug/dL (ref 45–182)
Saturation Ratios: 38 % (ref 17.9–39.5)
TIBC: 281 ug/dL (ref 250–450)
UIBC: 175 ug/dL

## 2018-05-28 LAB — FERRITIN: FERRITIN: 315 ng/mL (ref 24–336)

## 2018-05-28 NOTE — Progress Notes (Signed)
Research Encounter  Patient presented today for 12-week final follow-up of the IV IRON Study. Patient filled out QOL survey, completed the 6MWT and had labs drawn.   6MWT: Baseline SpO2 96%, HR 60 Walked 1250 feet No stops, walked full 6 minutes Final SpO2 96%, HR [place], PharmD Pharmacy Resident

## 2018-05-29 ENCOUNTER — Ambulatory Visit (INDEPENDENT_AMBULATORY_CARE_PROVIDER_SITE_OTHER): Payer: Medicare HMO | Admitting: *Deleted

## 2018-05-29 ENCOUNTER — Telehealth (HOSPITAL_COMMUNITY): Payer: Self-pay

## 2018-05-29 ENCOUNTER — Telehealth (HOSPITAL_COMMUNITY): Payer: Self-pay | Admitting: Pharmacist

## 2018-05-29 DIAGNOSIS — I5022 Chronic systolic (congestive) heart failure: Secondary | ICD-10-CM | POA: Diagnosis not present

## 2018-05-29 DIAGNOSIS — I480 Paroxysmal atrial fibrillation: Secondary | ICD-10-CM

## 2018-05-29 DIAGNOSIS — I5032 Chronic diastolic (congestive) heart failure: Secondary | ICD-10-CM

## 2018-05-29 MED ORDER — FUROSEMIDE 40 MG PO TABS: 60.0000 mg | ORAL_TABLET | Freq: Every day | ORAL | 3 refills | Status: DC

## 2018-05-29 MED ORDER — CARVEDILOL 6.25 MG PO TABS: 12.5000 mg | ORAL_TABLET | Freq: Two times a day (BID) | ORAL | 3 refills | Status: DC

## 2018-05-29 NOTE — Telephone Encounter (Signed)
Discussed with MD - increase carvedilol 12.5mg  BID Per pt Tab too small to cut in 1/2 BP, HR stable

## 2018-05-29 NOTE — Telephone Encounter (Signed)
Notes recorded by Shirley Muscat, RN on 05/29/2018 at 10:55 AM EDT Pt wife aware of results, agreeable to med changes (changes made in [facility]), and will get labs with Dr. Aundra Dubin   ------  Notes recorded by Larey Dresser, MD on 05/28/2018 at 9:28 PM EDT Creatinine up, decrease Lasix to 60 mg daily. Call with weight gain. BMET 1 week.

## 2018-05-29 NOTE — Progress Notes (Signed)
Remote pacemaker transmission.   

## 2018-05-30 ENCOUNTER — Telehealth (HOSPITAL_COMMUNITY): Payer: Self-pay

## 2018-05-30 MED ORDER — FUROSEMIDE 40 MG PO TABS: 40.0000 mg | ORAL_TABLET | Freq: Every day | ORAL | 3 refills | Status: DC

## 2018-05-30 NOTE — Telephone Encounter (Signed)
Notes recorded by Shirley Muscat, RN on 05/30/2018 at 10:03 AM EDT Pt is taking 60 mg alternating with 40. Per Dr. Aundra Dubin pt is to hold for 1 day and decrease to 40 mg daily. Pt wife aware of results. ------  Notes recorded by Shirley Muscat, RN on 05/29/2018 at 10:55 AM EDT Pt wife aware of results, agreeable to med changes (changes made in [facility]), and will get labs with Dr. Aundra Dubin   ------  Notes recorded by Larey Dresser, MD on 05/28/2018 at 9:28 PM EDT Creatinine up, decrease Lasix to 60 mg daily. Call with weight gain. BMET 1 week.

## 2018-06-01 ENCOUNTER — Encounter (HOSPITAL_COMMUNITY): Payer: Medicare HMO | Admitting: Cardiology

## 2018-06-01 LAB — CUP PACEART REMOTE DEVICE CHECK
Battery Remaining Longevity: 122 mo
Battery Voltage: 3.08 V
Brady Statistic AS VS Percent: 59.98 %
Brady Statistic RA Percent Paced: 39.94 %
Date Time Interrogation Session: 20190618051836
Implantable Lead Implant Date: 20181211
Implantable Lead Implant Date: 20181211
Implantable Lead Location: 753859
Implantable Lead Model: 3830
Lead Channel Pacing Threshold Amplitude: 0.625 V
Lead Channel Pacing Threshold Pulse Width: 0.4 ms
Lead Channel Pacing Threshold Pulse Width: 0.4 ms
Lead Channel Sensing Intrinsic Amplitude: 5.125 mV
Lead Channel Sensing Intrinsic Amplitude: 5.125 mV
Lead Channel Setting Pacing Pulse Width: 1 ms
Lead Channel Setting Sensing Sensitivity: 1.2 mV
MDC IDC LEAD LOCATION: 753860
MDC IDC MSMT LEADCHNL RA IMPEDANCE VALUE: 342 Ohm
MDC IDC MSMT LEADCHNL RA IMPEDANCE VALUE: 399 Ohm
MDC IDC MSMT LEADCHNL RV IMPEDANCE VALUE: 285 Ohm
MDC IDC MSMT LEADCHNL RV IMPEDANCE VALUE: 437 Ohm
MDC IDC MSMT LEADCHNL RV PACING THRESHOLD AMPLITUDE: 1.625 V
MDC IDC MSMT LEADCHNL RV SENSING INTR AMPL: 3.25 mV
MDC IDC MSMT LEADCHNL RV SENSING INTR AMPL: 3.25 mV
MDC IDC PG IMPLANT DT: 20181211
MDC IDC SET LEADCHNL RA PACING AMPLITUDE: 2 V
MDC IDC SET LEADCHNL RV PACING AMPLITUDE: 2.5 V
MDC IDC STAT BRADY AP VP PERCENT: 0.03 %
MDC IDC STAT BRADY AP VS PERCENT: 39.92 %
MDC IDC STAT BRADY AS VP PERCENT: 0.08 %
MDC IDC STAT BRADY RV PERCENT PACED: 0.1 %

## 2018-06-06 ENCOUNTER — Ambulatory Visit (HOSPITAL_COMMUNITY)
Admission: RE | Admit: 2018-06-06 | Discharge: 2018-06-06 | Disposition: A | Discharge status: Home / Self Care | Payer: Medicare HMO | Source: Ambulatory Visit | Attending: Internal Medicine | Admitting: Internal Medicine

## 2018-06-06 VITALS — BP 128/72 | HR 60 | Wt 253.0 lb

## 2018-06-06 DIAGNOSIS — I480 Paroxysmal atrial fibrillation: Secondary | ICD-10-CM | POA: Diagnosis not present

## 2018-06-06 DIAGNOSIS — I2510 Atherosclerotic heart disease of native coronary artery without angina pectoris: Secondary | ICD-10-CM | POA: Insufficient documentation

## 2018-06-06 DIAGNOSIS — I5022 Chronic systolic (congestive) heart failure: Secondary | ICD-10-CM | POA: Diagnosis not present

## 2018-06-06 DIAGNOSIS — D509 Iron deficiency anemia, unspecified: Secondary | ICD-10-CM | POA: Insufficient documentation

## 2018-06-06 DIAGNOSIS — I351 Nonrheumatic aortic (valve) insufficiency: Secondary | ICD-10-CM | POA: Insufficient documentation

## 2018-06-06 DIAGNOSIS — J449 Chronic obstructive pulmonary disease, unspecified: Secondary | ICD-10-CM | POA: Diagnosis not present

## 2018-06-06 DIAGNOSIS — E785 Hyperlipidemia, unspecified: Secondary | ICD-10-CM | POA: Diagnosis not present

## 2018-06-06 DIAGNOSIS — N183 Chronic kidney disease, stage 3 (moderate): Secondary | ICD-10-CM | POA: Insufficient documentation

## 2018-06-06 DIAGNOSIS — Z8673 Personal history of transient ischemic attack (TIA), and cerebral infarction without residual deficits: Secondary | ICD-10-CM | POA: Insufficient documentation

## 2018-06-06 LAB — BASIC METABOLIC PANEL
ANION GAP: 9 (ref 5–15)
BUN: 30 mg/dL — ABNORMAL HIGH (ref 8–23)
CHLORIDE: 107 mmol/L (ref 98–111)
CO2: 21 mmol/L — AB (ref 22–32)
Calcium: 8.9 mg/dL (ref 8.9–10.3)
Creatinine, Ser: 1.86 mg/dL — ABNORMAL HIGH (ref 0.61–1.24)
GFR calc non Af Amer: 35 mL/min — ABNORMAL LOW (ref >60)
GFR, EST AFRICAN AMERICAN: 40 mL/min — AB (ref >60)
GLUCOSE: 111 mg/dL — AB (ref 70–99)
POTASSIUM: 4.8 mmol/L (ref 3.5–5.1)
Sodium: 137 mmol/L (ref 135–145)

## 2018-06-06 MED ORDER — SPIRONOLACTONE 25 MG PO TABS: 25.0000 mg | ORAL_TABLET | Freq: Every day | ORAL | 6 refills | Status: DC

## 2018-06-06 NOTE — Patient Instructions (Addendum)
Increase Spironolactone 25mg  = 1 tab Daily Continue to weigh each day Call if any questions Follow up Labs on July 11 at 12:15 Follow Up with Afib Clinic July 11 at 1:30 Follow up with Dr. Aundra Dubin 07/27/18 at  11am

## 2018-06-06 NOTE — Progress Notes (Signed)
PCP: Dr. Justin Mend Cardiology: Dr. Radford Pax HF Cardiology: Dr. Aundra Dubin   HPI:   72 y.o.with history of CVA, paroxysmal atrial fibrillation, valvular heart disease, and chronic diastolic CHF presents for evaluation of CHF and valvular heart disease.    He had had episodes of dyspnea and initial workup led to a TEE in 5/18 showing normal EF with moderate AS and moderate MR.  He continued to have episodic dyspnea and ended up admitted in 8/18 with shortness of breath and chest pressure.  This led to a long, complicated hospitalization.  He was noted to be volume overloaded with acute diastolic CHF and was also noted to have symptomatic runs of atrial fibrillation with RVR.  Amiodarone was started to control the atrial fibrillation.  He developed respiratory distress => Bipap => intubated.  He became hypotensive, requiring pressors.  He developed AKI.  PNA was noted and he received broad spectrum abx.  He had a prolonged intubation but was eventually extubated.  Given deconditioning from long hospitalization, he went to CIR for a couple of weeks.  LHC in 9/18 showed occluded PDA with left>right collaterals.  TEE in 9/18 showed EF 50%, severe MR (possibly infarct-related with restricted posterior leaflet, and moderate AS + moderate-severe AI (possibly rheumatic).   In 12/18, he had cardiac surgery with bioprosthetic aortic and mitral valves placed.  He had SVG-PDA, Maze, and LA appendage clipping.  Post-op course was complicated by CHB requiring Medtronic dual chamber PPM (His bundle lead placed but captured right bundle so induced LBBB).   Echo 01/02/18 LVEF 30-35%, bioprosthetic MV and AoV function normally, RV mildly dilated with normal systolic function.    He was noted to be in atrial flutter with mild RVR and had TEE-guided DCCV on 04/11/18. TEE showed EF 25-30%, normal bioprosthetic aortic and mitral valves.  Pacemaker had been adjusted to decrease RV pacing. He has finished cardiac rehab.  He is seen  in pharmacy medication titration clinic today.  No significant dyspnea walking, or working in yard. He can climb a flight of stairs.  No chest pain.  No lightheadedness or palpitations.  Weight stable.  At last visit, 6/17 labs drawn Cr bump 1.7> 2 Furosemide decreased to 40mg  daily, he complained of unable to break coreg in 1/2 so dose inc 2 tabs = 12.5mg  BID tolerating with no problems.     . Shortness of breath/dyspnea on exertion? no  . Orthopnea/PND? no . Edema? no . Lightheadedness/dizziness? no . Daily weights at home? yes . Blood pressure/heart rate monitoring at home? yes . Following low-sodium/fluid-restricted diet? yes  HF Medications: Carvedilol 12.5mg  BID Furosemide 40mg  Daily Entresto 24-26mg  BID Spironolactone 12.5mg  Daily  Has the patient been experiencing any side effects to the medications prescribed?  yes  Does the patient have any problems obtaining medications due to transportation or finances?   no  Understanding of regimen: fair Understanding of indications: fair Potential of compliance: fair Patient understands to avoid NSAIDs. Patient understands to avoid decongestants.    Pertinent Lab Values: . Serum creatinine 1.8(back down), CO2 21, Potassium 4.8 (slight hemolysis so falsely elevated), Sodium 137   Vital Signs: . Weight: 253lb (dry weight: 252lb at home) . Blood pressure: 128/72 but ususally 100/60 at home  . Heart rate: 60 . O2 97% ReDS Vest - 06/06/18 1300      ReDS Vest   Ruler Value  37    [place]  -- D   D   ReDS Value  35     .  Assessment: 1. Chronicsystolic CHF (EF 83%), due to NICM. NYHA class IIsymptoms. -volume status stable no distended abdomen, no LE edema, weight stable and ReDS reading 35% after furosemide decreased last week to 40mg  Daily   - BP stable 128/72 in clinic today but previous readings and home readings usually 100/60 without dizziness- but do not want to double Entresto, HR 60 - tolerating carvedilol  increase last week 12.5mg  BID,  labs today Cr improved from last week and K 4.8 falsely  elevated with hemolysis - will increase spironolactone 25mg  daily  -continue other current HF medications Carvedilol 12.5mg  BID Furosemide 40mg  Daily Entresto 24-26mg  BID  - Basic disease state pathophysiology, medication indication, mechanism and side effects reviewed at length with patient and he verbalized understanding  2) Valvular heart disease: TEE in 9/18 showed severe MR, probably infarct-related with restrictive posterior mitral leaflet. There was moderate aortic stenosis and moderate-severe aortic insufficiency, possible rheumatic.  In 12/18, he had bioprosthetic MVR and AVR.  - Valves stable on 5/19 TEE. Per MD  3)CKD: Stage 3. BMET  Cr improved from last 4. Atrial fibrillation/flutter: Paroxysmal. He has had a Maze procedure.  He tends to tolerate atrial arrhythmias poorly.  Most recently, he had atrial flutter with DCCV in 5/19.  He is now maintaining NSR.  - He is on warfarin.  - Continue amiodarone for now, I will stop it if he maintains NSR for a couple more months (at my next appointment with him).  Meanwhile,LFTs wnl 6/19 and TSH slightly elevated check Free T4, Free T3  He will need a regular eye exam.  5. COPD: Mild obstruction on PFTs.  He has quit smoking.  Stable. No change.   6. CVA: Remote, minimal residual. S/p carotid stenting.  - No ASA given warfarin use.  7. Hyperlipidemia: Good lipids 4/19.    Continue statin 6/19LFTs  wnl LDL < 100 - consistent with previous, TG elevated - from previous - may not have been fasting in the afternoon - continue lovastatin 40mg  qd 8. Anemia: Fe deficiency.  FOBT negative 8/18.  Got feraheme in 8/18 and repeated 3/19 Fe panel stable 6/19  9. 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  The artery to the PDA was occluded with left to  right collaterals.  He had SVG-PDA in 12/18. No chest pain.  - No ASA given warfarin use.  10. CHB: has Medtronic PPM.  Minimal RV pacing now per MD       Plan: 1) Medication changes: Based on clinical presentation,  -volume status stable- after furosemide decreased last week to 40mg  Daily  - BP stable 128/72 in clinic today but previous readings and home readings usually 100/60 without dizziness- but do not want to double Entresto at this time, HR 60 - tolerating carvedilol increase last week 12.5mg  BID,   -labs today Cr improved from last week and K 4.8 falsely  elevated with hemolysis - will increase spironolactone 25mg  daily  -continue other current HF medications Carvedilol 12.5mg  BID Furosemide 40mg  Daily Entresto 24-26mg  BID  2) Labs: July 11 with appointment in Afib clinic 3) Follow-up: Dr. Aundra Dubin 07/27/18   Bonnita Nasuti Pharm.D. CPP, BCPS Clinical Pharmacist [phone] 06/06/2018 2:16 PM

## 2018-06-07 ENCOUNTER — Telehealth (HOSPITAL_COMMUNITY): Payer: Self-pay | Admitting: *Deleted

## 2018-06-07 NOTE — Telephone Encounter (Signed)
Patient approved for $1,000.00 towards Entresto with [employer].  Approved from 03/08/18 through 06/05/18.

## 2018-06-08 ENCOUNTER — Encounter (HOSPITAL_COMMUNITY): Payer: Self-pay | Admitting: Cardiology

## 2018-06-11 ENCOUNTER — Other Ambulatory Visit (HOSPITAL_COMMUNITY): Payer: Self-pay | Admitting: Cardiology

## 2018-06-11 MED FILL — WARFARIN SODIUM 5 MG TABLET: 5 | 30 days supply | Qty: 30 | Fill #0

## 2018-06-12 DIAGNOSIS — E782 Mixed hyperlipidemia: Secondary | ICD-10-CM | POA: Diagnosis not present

## 2018-06-12 DIAGNOSIS — I10 Essential (primary) hypertension: Secondary | ICD-10-CM | POA: Diagnosis not present

## 2018-06-12 DIAGNOSIS — R7303 Prediabetes: Secondary | ICD-10-CM | POA: Diagnosis not present

## 2018-06-12 DIAGNOSIS — Z0000 Encounter for general adult medical examination without abnormal findings: Secondary | ICD-10-CM | POA: Diagnosis not present

## 2018-06-19 ENCOUNTER — Ambulatory Visit (INDEPENDENT_AMBULATORY_CARE_PROVIDER_SITE_OTHER): Payer: Medicare HMO | Admitting: *Deleted

## 2018-06-19 DIAGNOSIS — Z953 Presence of xenogenic heart valve: Secondary | ICD-10-CM

## 2018-06-19 DIAGNOSIS — Z8679 Personal history of other diseases of the circulatory system: Secondary | ICD-10-CM

## 2018-06-19 DIAGNOSIS — I480 Paroxysmal atrial fibrillation: Secondary | ICD-10-CM | POA: Diagnosis not present

## 2018-06-19 DIAGNOSIS — Z9889 Other specified postprocedural states: Secondary | ICD-10-CM

## 2018-06-19 DIAGNOSIS — Z5181 Encounter for therapeutic drug level monitoring: Secondary | ICD-10-CM | POA: Diagnosis not present

## 2018-06-19 DIAGNOSIS — Z951 Presence of aortocoronary bypass graft: Secondary | ICD-10-CM | POA: Diagnosis not present

## 2018-06-19 LAB — POCT INR: INR: 3.6 — AB (ref 2.0–3.0)

## 2018-06-19 MED ORDER — WARFARIN SODIUM 5 MG PO TABS: ORAL_TABLET | ORAL | 1 refills | Status: DC

## 2018-06-19 NOTE — Patient Instructions (Signed)
Description   Skip today's dose, then Continue taking 1 tablet daily except 1.5 tablets on Mondays.  Recheck INR in 2 weeks.  Call with any new medications scheduled procedures or any questions  [phone]

## 2018-06-20 ENCOUNTER — Telehealth (HOSPITAL_COMMUNITY): Payer: Self-pay

## 2018-06-20 ENCOUNTER — Encounter (HOSPITAL_COMMUNITY): Payer: Self-pay

## 2018-06-20 NOTE — Telephone Encounter (Signed)
Notes recorded by Shirley Muscat, RN on 06/20/2018 at 2:02 PM EDT I have been unable to reach this patient by phone. A letter is being sent.  ------  Notes recorded by Shirley Muscat, RN on 06/13/2018 at 10:56 AM EDT Line busy ------  Notes recorded by Shirley Muscat, RN on 06/11/2018 at 12:24 PM EDT Phon disconnecting after answering ------  Notes recorded by Shirley Muscat, RN on 06/08/2018 at 2:29 PM EDT Left message to call back   ------  Notes recorded by Larey Dresser, MD on 05/25/2018 at 4:05 PM EDT EF remains 30-35%, I think that he needs evaluation by EP for PPM upgrade to ICD.

## 2018-06-21 ENCOUNTER — Other Ambulatory Visit: Payer: Self-pay

## 2018-06-21 ENCOUNTER — Ambulatory Visit (HOSPITAL_COMMUNITY)
Admission: RE | Admit: 2018-06-21 | Discharge: 2018-06-21 | Disposition: A | Discharge status: Home / Self Care | Payer: Medicare HMO | Source: Ambulatory Visit | Attending: Cardiology | Admitting: Cardiology

## 2018-06-21 ENCOUNTER — Ambulatory Visit (HOSPITAL_COMMUNITY)
Admission: RE | Admit: 2018-06-21 | Discharge: 2018-06-21 | Disposition: A | Discharge status: Home / Self Care | Payer: Medicare HMO | Source: Ambulatory Visit | Attending: Nurse Practitioner | Admitting: Nurse Practitioner

## 2018-06-21 VITALS — BP 124/68 | HR 60 | Ht 68.5 in | Wt 251.8 lb

## 2018-06-21 DIAGNOSIS — Z8679 Personal history of other diseases of the circulatory system: Secondary | ICD-10-CM

## 2018-06-21 DIAGNOSIS — I252 Old myocardial infarction: Secondary | ICD-10-CM | POA: Insufficient documentation

## 2018-06-21 DIAGNOSIS — F329 Major depressive disorder, single episode, unspecified: Secondary | ICD-10-CM | POA: Insufficient documentation

## 2018-06-21 DIAGNOSIS — G4730 Sleep apnea, unspecified: Secondary | ICD-10-CM | POA: Insufficient documentation

## 2018-06-21 DIAGNOSIS — I509 Heart failure, unspecified: Secondary | ICD-10-CM | POA: Insufficient documentation

## 2018-06-21 DIAGNOSIS — Z8673 Personal history of transient ischemic attack (TIA), and cerebral infarction without residual deficits: Secondary | ICD-10-CM | POA: Insufficient documentation

## 2018-06-21 DIAGNOSIS — I480 Paroxysmal atrial fibrillation: Secondary | ICD-10-CM | POA: Diagnosis not present

## 2018-06-21 DIAGNOSIS — E785 Hyperlipidemia, unspecified: Secondary | ICD-10-CM | POA: Diagnosis not present

## 2018-06-21 DIAGNOSIS — F419 Anxiety disorder, unspecified: Secondary | ICD-10-CM | POA: Insufficient documentation

## 2018-06-21 DIAGNOSIS — Z7901 Long term (current) use of anticoagulants: Secondary | ICD-10-CM | POA: Diagnosis not present

## 2018-06-21 DIAGNOSIS — Z9049 Acquired absence of other specified parts of digestive tract: Secondary | ICD-10-CM | POA: Diagnosis not present

## 2018-06-21 DIAGNOSIS — K219 Gastro-esophageal reflux disease without esophagitis: Secondary | ICD-10-CM | POA: Insufficient documentation

## 2018-06-21 DIAGNOSIS — Z953 Presence of xenogenic heart valve: Secondary | ICD-10-CM | POA: Diagnosis not present

## 2018-06-21 DIAGNOSIS — I679 Cerebrovascular disease, unspecified: Secondary | ICD-10-CM | POA: Diagnosis not present

## 2018-06-21 DIAGNOSIS — I739 Peripheral vascular disease, unspecified: Secondary | ICD-10-CM | POA: Insufficient documentation

## 2018-06-21 DIAGNOSIS — Z951 Presence of aortocoronary bypass graft: Secondary | ICD-10-CM | POA: Insufficient documentation

## 2018-06-21 DIAGNOSIS — I110 Hypertensive heart disease with heart failure: Secondary | ICD-10-CM | POA: Diagnosis not present

## 2018-06-21 DIAGNOSIS — I2510 Atherosclerotic heart disease of native coronary artery without angina pectoris: Secondary | ICD-10-CM | POA: Insufficient documentation

## 2018-06-21 DIAGNOSIS — J449 Chronic obstructive pulmonary disease, unspecified: Secondary | ICD-10-CM | POA: Insufficient documentation

## 2018-06-21 DIAGNOSIS — Z9889 Other specified postprocedural states: Secondary | ICD-10-CM | POA: Diagnosis not present

## 2018-06-21 DIAGNOSIS — Z87891 Personal history of nicotine dependence: Secondary | ICD-10-CM | POA: Diagnosis not present

## 2018-06-21 DIAGNOSIS — Z950 Presence of cardiac pacemaker: Secondary | ICD-10-CM | POA: Diagnosis not present

## 2018-06-21 DIAGNOSIS — I5032 Chronic diastolic (congestive) heart failure: Secondary | ICD-10-CM

## 2018-06-21 DIAGNOSIS — Z79899 Other long term (current) drug therapy: Secondary | ICD-10-CM | POA: Diagnosis not present

## 2018-06-21 LAB — BASIC METABOLIC PANEL
Anion gap: 9 (ref 5–15)
BUN: 37 mg/dL — AB (ref 8–23)
CHLORIDE: 106 mmol/L (ref 98–111)
CO2: 24 mmol/L (ref 22–32)
CREATININE: 2.1 mg/dL — AB (ref 0.61–1.24)
Calcium: 9.1 mg/dL (ref 8.9–10.3)
GFR calc non Af Amer: 30 mL/min — ABNORMAL LOW (ref >60)
GFR, EST AFRICAN AMERICAN: 35 mL/min — AB (ref >60)
Glucose, Bld: 108 mg/dL — ABNORMAL HIGH (ref 70–99)
Potassium: 4.8 mmol/L (ref 3.5–5.1)
Sodium: 139 mmol/L (ref 135–145)

## 2018-06-21 NOTE — Progress Notes (Signed)
Primary Care Physician: Maurice Small, MD Referring Physician: Dr. 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 male with a h/o CAD s/p 1 vessel bypass, PPM, PVD, COPD, HTN, cerebrovascular disease status post right hemispheric stroke in 2004, hypertension, aortic stenosis with aortic insufficiency, mitral regurgitation, and recurrent paroxysmal atrial fibrillation, who on  11/16/2017     underwent a aortic valve replacement, mitral valve replacement, coronary artery bypass grafting x1, and maze procedure with Dr. Roxy Manns.  He is  the afib clinic today for long term surveillance for maze procedure. He states that he has not noted any afib. He is in SR today. He continues on amiodarone, but per Dr. Claris Gladden last note, he will consider stopping amiodarone on his next visit if he continues in [place]. The pt denies any shortness of breath. He feels well. S/p maze questionnaire filled out today and placed in Epic.  Today, he denies symptoms of palpitations, chest pain, shortness of breath, orthopnea, PND, lower extremity edema, dizziness, presyncope, syncope, or neurologic sequela. The patient is tolerating medications without difficulties and is otherwise without complaint today.   Past Medical History:  Diagnosis Date  . Anemia 07/13/2017  . Anxiety   . Aortic insufficiency   . Aortic stenosis, moderate 07/13/2017  . Arthritis    back   . Carotid stenosis    Right carotid stent (widely patent) 40 - 59% left plaque 11/13  . COPD (chronic obstructive pulmonary disease) ([place])   . Coronary artery disease involving native coronary artery of native heart without angina pectoris   . Depression   . Dyslipidemia   . GERD (gastroesophageal reflux disease)   . Heart murmur   . Hemiplegia affecting unspecified side, late effect of cerebrovascular disease    resolved- from L side   . Hypertension   . Jaundice    resolved following ERCP & Cholecystectomy  . Mild emphysema ([place])   . Mitral  regurgitation   . Mitral valve insufficiency and aortic valve insufficiency   . Myocardial infarction ([place])   . Paroxysmal atrial fibrillation ([facility])   . Pre-diabetes    per spouse  . S/P aortic valve replacement with bioprosthetic valve 11/16/2017   23 mm [facility] Ease stented bovine pericardial tissue valve  . S/P CABG x 1 11/16/2017   SVG to PDA with EVH via right thigh  . S/P Maze operation for atrial fibrillation 11/16/2017   Left side lesion set using bipolar radiofrequency and cryothermy ablation with clipping of LA appendage  . S/P mitral valve replacement with bioprosthetic valve 11/16/2017   27 mm [facility] Mitral stented bovine pericardial tissue valve  . Sleep apnea    does not wear CPAP  . Sleep concern    resulted in surgery- after + sleep test. Pt. doesn't have a problem any longer.   . Stroke ([place]) 03/11/2003   stent placed on the 31, 3, 2004, L side   . Wears glasses   . Wears hearing aid in both ears   . Wears partial dentures    Past Surgical History:  Procedure Laterality Date  . AORTIC VALVE REPLACEMENT N/A 11/16/2017   Procedure: AORTIC VALVE REPLACEMENT (AVR) with 24mm Magna Ease;  Surgeon: Rexene Alberts, MD;  Location: [place];  Service: Open Heart Surgery;  Laterality: N/A;  . ARTERIAL LINE INSERTION Right 11/16/2017   Procedure: ARTERIAL LINE INSERTION -RIGHT FEMORAL;  Surgeon: Rexene Alberts, MD;  Location: [place];  Service: Open  Heart Surgery;  Laterality: Right;  . BACK SURGERY     lumbar back  . CARDIOVERSION N/A 04/11/2018   Procedure: CARDIOVERSION;  Surgeon: Larey Dresser, MD;  Location: [facility] ENDOSCOPY;  Service: Cardiovascular;  Laterality: N/A;  . CHOLECYSTECTOMY    . CORONARY ARTERY BYPASS GRAFT N/A 11/16/2017   Procedure: CORONARY ARTERY BYPASS GRAFTING (CABG) x 1;  Surgeon: Rexene Alberts, MD;  Location: [place];  Service: Open Heart Surgery;  Laterality: N/A;  . ENDOVEIN HARVEST OF GREATER SAPHENOUS VEIN Right 11/16/2017   Procedure:  ENDOVEIN HARVEST OF GREATER SAPHENOUS VEIN;  Surgeon: Rexene Alberts, MD;  Location: [place];  Service: Open Heart Surgery;  Laterality: Right;  . ERCP N/A 05/31/2013   Procedure: ENDOSCOPIC RETROGRADE CHOLANGIOPANCREATOGRAPHY (ERCP);  Surgeon: Ladene Artist, MD;  Location: [facility] ENDOSCOPY;  Service: Endoscopy;  Laterality: N/A;  . FOOT SURGERY     right  . IR RADIOLOGY PERIPHERAL GUIDED IV START  09/28/2017  . IR US GUIDE VASC ACCESS RIGHT  09/28/2017  . LAPAROSCOPIC CHOLECYSTECTOMY SINGLE PORT N/A 06/01/2013   Procedure: LAPAROSCOPIC CHOLECYSTECTOMY SINGLE PORT;  Surgeon: Adin Hector, MD;  Location: [facility];  Service: General;  Laterality: N/A;  . MAZE N/A 11/16/2017   Procedure: MAZE;  Surgeon: Rexene Alberts, MD;  Location: [place];  Service: Open Heart Surgery;  Laterality: N/A;  . MITRAL VALVE REPAIR N/A 11/16/2017   Procedure: MITRAL VALVE  REPLACEMENT with 21mm MagnaEase;  Surgeon: Rexene Alberts, MD;  Location: [place];  Service: Open Heart Surgery;  Laterality: N/A;  . PACEMAKER IMPLANT N/A 11/21/2017   Procedure: PACEMAKER IMPLANT;  Surgeon: Evans Lance, MD;  Location: [place] CV LAB;  Service: Cardiovascular;  Laterality: N/A;  . POLYPECTOMY    . RIGHT/LEFT HEART CATH AND CORONARY ANGIOGRAPHY N/A 08/30/2017   Procedure: RIGHT/LEFT HEART CATH AND CORONARY ANGIOGRAPHY;  Surgeon: Larey Dresser, MD;  Location: [place] CV LAB;  Service: Cardiovascular;  Laterality: N/A;  . SHOULDER ARTHROSCOPY WITH ROTATOR CUFF REPAIR AND SUBACROMIAL DECOMPRESSION Left 05/18/2017  . SHOULDER ARTHROSCOPY WITH ROTATOR CUFF REPAIR AND SUBACROMIAL DECOMPRESSION Left 05/18/2017   Procedure: SHOULDER ARTHROSCOPY WITH ROTATOR CUFF REPAIR AND SUBACROMIAL DECOMPRESSION;  Surgeon: Tania Ade, MD;  Location: [place];  Service: Orthopedics;  Laterality: Left;  LEFT SHOULDER ARTHROSCOPY WITH ROTATOR CUFF REPAIR AND SUBACROMIAL DECOMPRESSION  . TEE WITHOUT CARDIOVERSION N/A 05/09/2017   Procedure:  TRANSESOPHAGEAL ECHOCARDIOGRAM (TEE);  Surgeon: Skeet Latch, MD;  Location: [place];  Service: Cardiovascular;  Laterality: N/A;  . TEE WITHOUT CARDIOVERSION N/A 08/30/2017   Procedure: TRANSESOPHAGEAL ECHOCARDIOGRAM (TEE);  Surgeon: Larey Dresser, MD;  Location: [facility] ENDOSCOPY;  Service: Cardiovascular;  Laterality: N/A;  . TEE WITHOUT CARDIOVERSION N/A 11/16/2017   Procedure: TRANSESOPHAGEAL ECHOCARDIOGRAM (TEE);  Surgeon: Rexene Alberts, MD;  Location: [place];  Service: Open Heart Surgery;  Laterality: N/A;  . TEE WITHOUT CARDIOVERSION N/A 04/11/2018   Procedure: TRANSESOPHAGEAL ECHOCARDIOGRAM (TEE);  Surgeon: Larey Dresser, MD;  Location: [facility] ENDOSCOPY;  Service: Cardiovascular;  Laterality: N/A;  . TONSILLECTOMY      Current Outpatient Medications  Medication Sig Dispense Refill  . acetaminophen (TYLENOL) 325 MG tablet Take 1 tablet (325 mg total) by mouth every 6 (six) hours as needed for mild pain. 30 tablet 0  . amiodarone (PACERONE) 200 MG tablet Take 1 tablet (200 mg total) by mouth daily. 60 tablet 3  . calcium carbonate (TUMS - DOSED IN MG ELEMENTAL CALCIUM) 500 MG chewable tablet Chew  1 tablet by mouth daily as needed for indigestion or heartburn.    . carvedilol (COREG) 6.25 MG tablet Take 2 tablets (12.5 mg total) by mouth 2 (two) times daily. 120 tablet 3  . cetirizine (ZYRTEC) 10 MG tablet Take 10 mg by mouth daily as needed for allergies.    . citalopram (CELEXA) 20 MG tablet Take 1 tablet (20 mg total) by mouth at bedtime. 30 tablet 5  . docusate sodium (COLACE) 100 MG capsule Take 1 capsule (100 mg total) by mouth 3 (three) times daily as needed. 20 capsule 0  . furosemide (LASIX) 40 MG tablet Take 1 tablet (40 mg total) by mouth daily. 30 tablet 3  . ipratropium (ATROVENT) 0.03 % nasal spray Place 2 sprays into both nostrils 2 (two) times daily.     [name] LORazepam (ATIVAN) 0.5 MG tablet Take 1 tablet (0.5 mg total) by mouth every 6 (six) hours as needed for anxiety  (aggitation). 20 tablet 0  . lovastatin (MEVACOR) 40 MG tablet TAKE 1 TABLET AT BEDTIME  (NEW  DOSE) 90 tablet 3  . ondansetron (ZOFRAN) 4 MG tablet Take 4 mg by mouth every 8 (eight) hours as needed for nausea or vomiting.    . sacubitril-valsartan (ENTRESTO) 24-26 MG Take 1 tablet by mouth 2 (two) times daily. 180 tablet 3  . spironolactone (ALDACTONE) 25 MG tablet Take 1 tablet (25 mg total) by mouth daily. 30 tablet 6  . traZODone (DESYREL) 50 MG tablet Take 50 mg by mouth at bedtime.     [name] warfarin (COUMADIN) 5 MG tablet TAKE 1 TABLET BY MOUTH ONCE DAILY AT 6 PM 100 tablet 1  . pantoprazole (PROTONIX) 40 MG tablet Take 1 tablet (40 mg total) by mouth daily. (Patient not taking: Reported on 06/21/2018) 30 tablet 1   No current facility-administered medications for this encounter.     Allergies  Allergen Reactions  . Bee Venom Anaphylaxis and Swelling    Social History   Socioeconomic History  . Marital status: Married    Spouse name: Not on file  . Number of children: 3  . Years of education: Not on file  . Highest education level: Not on file  Occupational History    Employer: RETIRED  Social Needs  . Financial resource strain: Not on file  . Food insecurity:    Worry: Not on file    Inability: Not on file  . Transportation needs:    Medical: Not on file    Non-medical: Not on file  Tobacco Use  . Smoking status: Former Smoker    Packs/day: 0.50    Years: 57.00    Pack years: 28.50    Types: Cigarettes    Last attempt to quit: 07/13/1997    Years since quitting: 20.9  . Smokeless tobacco: Never Used  Substance and Sexual Activity  . Alcohol use: Yes    Alcohol/week: 1.2 - 1.8 oz    Types: 2 - 3 Glasses of wine per week    Comment: moderate wine ; not since 8/ 2018  . Drug use: No  . Sexual activity: Yes  Lifestyle  . Physical activity:    Days per week: Not on file    Minutes per session: Not on file  . Stress: Not on file  Relationships  . Social  connections:    Talks on phone: Not on file    Gets together: Not on file    Attends religious service: Not on file  Active member of club or organization: Not on file    Attends meetings of clubs or organizations: Not on file    Relationship status: Not on file  . Intimate partner violence:    Fear of current or ex partner: Not on file    Emotionally abused: Not on file    Physically abused: Not on file    Forced sexual activity: Not on file  Other Topics Concern  . Not on file  Social History Narrative   Two living children.  Lives with wife.      Family History  Problem Relation Age of Onset  . Stroke Father        No details  . Angina Mother     ROS- All systems are reviewed and negative except as per the HPI above  Physical Exam: Vitals:   06/21/18 1339  BP: 124/68  Pulse: 60  Weight: 251 lb 12.8 oz (114.2 kg)  Height: 5' 8.5" (1.74 m)   Wt Readings from Last 3 Encounters:  06/21/18 251 lb 12.8 oz (114.2 kg)  06/06/18 253 lb (114.8 kg)  05/21/18 250 lb (113.4 kg)    Labs: Lab Results  Component Value Date   NA 139 06/21/2018   K 4.8 06/21/2018   CL 106 06/21/2018   CO2 24 06/21/2018   GLUCOSE 108 (H) 06/21/2018   BUN 37 (H) 06/21/2018   CREATININE 2.10 (H) 06/21/2018   CALCIUM 9.1 06/21/2018   PHOS 4.5 08/01/2017   MG 2.8 (H) 11/17/2017   Lab Results  Component Value Date   INR 3.6 (A) 06/19/2018   Lab Results  Component Value Date   CHOL 164 05/28/2018   HDL 40 (L) 05/28/2018   LDLCALC 75 05/28/2018   TRIG 246 (H) 05/28/2018     GEN- The patient is well appearing, alert and oriented x 3 today.   Head- normocephalic, atraumatic Eyes-  Sclera clear, conjunctiva pink Ears- hearing intact Oropharynx- clear Neck- supple, no JVP Lymph- no cervical lymphadenopathy Lungs- Clear to ausculation bilaterally, normal work of breathing Heart- Regular rate and rhythm, no murmurs, rubs or gallops, PMI not laterally displaced GI- soft, NT, ND, +  BS Extremities- no clubbing, cyanosis, or edema MS- no significant deformity or atrophy Skin- no rash or lesion Psych- euthymic mood, full affect Neuro- strength and sensation are intact  EKG- atrial paced at 60 bpm Epic records reviewed Cup Paceart remote checks reviewed    Assessment and Plan: 1. Aortic valve replacement, mitral valve replacement, coronary artery bypass grafting x1, and maze procedure  11/2017 Pt appears to be staying in SR  He feels improved since surgery Continue coumadin with Dr. Aundra Dubin planning to stop if continues in SR Continue warfarin  2. HTN  Stable  3. PPM Per  Dr. Lovena Le and device clinic  4. CHF Stable  Weight stable  F/u in one year  Butch Penny C. Carroll, [facility] [address] [place], [place] [address] [phone]

## 2018-06-22 NOTE — Telephone Encounter (Signed)
Patient's wife called back regarding result letter.  Stated she called back several times regarding the results and never got a return call.  I called her back but had to leave VM.

## 2018-06-28 ENCOUNTER — Encounter (HOSPITAL_COMMUNITY): Payer: Self-pay | Admitting: Cardiology

## 2018-06-29 ENCOUNTER — Telehealth (HOSPITAL_COMMUNITY): Payer: Self-pay | Admitting: *Deleted

## 2018-06-29 ENCOUNTER — Telehealth: Payer: Self-pay | Admitting: Internal Medicine

## 2018-06-29 NOTE — Telephone Encounter (Signed)
Patient's wife called back and sent mychart message regarding result letter, stating she never received any phone calls.  Results reviewed with wife and she will call Dr. Lovena Le and set up appointment for possible ICD upgrade.  No further questions.

## 2018-06-29 NOTE — Telephone Encounter (Signed)
Opened in error

## 2018-06-29 NOTE — Telephone Encounter (Signed)
Pt's wife calling.    Needing to speak to nurse concerning the results from pt's Echo he had done and to discuss what Dr Aundra Dubin stated that may need to be done in his notes with talking to pt.

## 2018-06-29 NOTE — Telephone Encounter (Signed)
Pts wife calling today to discuss ICD implantation. I explained to her based off recent Echo and low EF, Dr Aundra Dubin believes he may benefit from an ICD upgrade based off his increased risk factors of having a dysrhythmia. I offered pt's wife an appointment with Dr Lovena Le, however she would like to discuss this with pt and her niece Administrator, arts.   Pt's wife will contact us back to let us know what they decide.

## 2018-07-04 ENCOUNTER — Ambulatory Visit (INDEPENDENT_AMBULATORY_CARE_PROVIDER_SITE_OTHER): Payer: Medicare HMO | Admitting: *Deleted

## 2018-07-04 DIAGNOSIS — Z951 Presence of aortocoronary bypass graft: Secondary | ICD-10-CM | POA: Diagnosis not present

## 2018-07-04 DIAGNOSIS — Z953 Presence of xenogenic heart valve: Secondary | ICD-10-CM | POA: Diagnosis not present

## 2018-07-04 DIAGNOSIS — Z8679 Personal history of other diseases of the circulatory system: Secondary | ICD-10-CM | POA: Diagnosis not present

## 2018-07-04 DIAGNOSIS — Z9889 Other specified postprocedural states: Secondary | ICD-10-CM

## 2018-07-04 DIAGNOSIS — Z5181 Encounter for therapeutic drug level monitoring: Secondary | ICD-10-CM | POA: Diagnosis not present

## 2018-07-04 DIAGNOSIS — I480 Paroxysmal atrial fibrillation: Secondary | ICD-10-CM | POA: Diagnosis not present

## 2018-07-04 LAB — POCT INR: INR: 3.1 — AB (ref 2.0–3.0)

## 2018-07-04 NOTE — Patient Instructions (Signed)
Description   Today only take 1/2 tablet, then start taking 1 tablet everyday.  Recheck INR in 2 weeks.  Call with any new medications scheduled procedures or any questions  [phone]

## 2018-07-10 ENCOUNTER — Encounter (HOSPITAL_COMMUNITY): Payer: Self-pay | Admitting: Cardiology

## 2018-07-10 ENCOUNTER — Other Ambulatory Visit (HOSPITAL_COMMUNITY): Payer: Self-pay | Admitting: *Deleted

## 2018-07-10 MED ORDER — AMIODARONE HCL 200 MG PO TABS: 200.0000 mg | ORAL_TABLET | Freq: Every day | ORAL | 3 refills | Status: DC

## 2018-07-18 ENCOUNTER — Ambulatory Visit (INDEPENDENT_AMBULATORY_CARE_PROVIDER_SITE_OTHER): Payer: Medicare HMO

## 2018-07-18 DIAGNOSIS — Z951 Presence of aortocoronary bypass graft: Secondary | ICD-10-CM | POA: Diagnosis not present

## 2018-07-18 DIAGNOSIS — Z953 Presence of xenogenic heart valve: Secondary | ICD-10-CM | POA: Diagnosis not present

## 2018-07-18 DIAGNOSIS — Z8679 Personal history of other diseases of the circulatory system: Secondary | ICD-10-CM

## 2018-07-18 DIAGNOSIS — Z5181 Encounter for therapeutic drug level monitoring: Secondary | ICD-10-CM

## 2018-07-18 DIAGNOSIS — Z9889 Other specified postprocedural states: Secondary | ICD-10-CM | POA: Diagnosis not present

## 2018-07-18 DIAGNOSIS — I480 Paroxysmal atrial fibrillation: Secondary | ICD-10-CM

## 2018-07-18 LAB — POCT INR: INR: 3 (ref 2.0–3.0)

## 2018-07-18 NOTE — Patient Instructions (Addendum)
  Description   Continue taking 1 tablet everyday.  Recheck INR in 3 weeks.  Call with any new medications scheduled procedures or any questions  [phone]

## 2018-07-22 MED FILL — ENTRESTO 24 MG-26 MG TABLET: 24-26 | 30 days supply | Qty: 60 | Fill #1

## 2018-07-27 ENCOUNTER — Encounter (HOSPITAL_COMMUNITY): Payer: Self-pay

## 2018-07-27 ENCOUNTER — Ambulatory Visit (HOSPITAL_COMMUNITY)
Admission: RE | Admit: 2018-07-27 | Discharge: 2018-07-27 | Disposition: A | Discharge status: Home / Self Care | Payer: Medicare HMO | Source: Ambulatory Visit | Attending: Cardiology | Admitting: Cardiology

## 2018-07-27 VITALS — BP 127/66 | HR 58 | Wt 257.0 lb

## 2018-07-27 DIAGNOSIS — Z7901 Long term (current) use of anticoagulants: Secondary | ICD-10-CM | POA: Insufficient documentation

## 2018-07-27 DIAGNOSIS — M109 Gout, unspecified: Secondary | ICD-10-CM | POA: Insufficient documentation

## 2018-07-27 DIAGNOSIS — N183 Chronic kidney disease, stage 3 (moderate): Secondary | ICD-10-CM | POA: Insufficient documentation

## 2018-07-27 DIAGNOSIS — I4892 Unspecified atrial flutter: Secondary | ICD-10-CM | POA: Diagnosis not present

## 2018-07-27 DIAGNOSIS — Z87891 Personal history of nicotine dependence: Secondary | ICD-10-CM | POA: Insufficient documentation

## 2018-07-27 DIAGNOSIS — J449 Chronic obstructive pulmonary disease, unspecified: Secondary | ICD-10-CM | POA: Diagnosis not present

## 2018-07-27 DIAGNOSIS — I480 Paroxysmal atrial fibrillation: Secondary | ICD-10-CM | POA: Diagnosis not present

## 2018-07-27 DIAGNOSIS — Z823 Family history of stroke: Secondary | ICD-10-CM | POA: Insufficient documentation

## 2018-07-27 DIAGNOSIS — I5042 Chronic combined systolic (congestive) and diastolic (congestive) heart failure: Secondary | ICD-10-CM | POA: Diagnosis not present

## 2018-07-27 DIAGNOSIS — Z950 Presence of cardiac pacemaker: Secondary | ICD-10-CM | POA: Insufficient documentation

## 2018-07-27 DIAGNOSIS — E785 Hyperlipidemia, unspecified: Secondary | ICD-10-CM | POA: Diagnosis not present

## 2018-07-27 DIAGNOSIS — Z8673 Personal history of transient ischemic attack (TIA), and cerebral infarction without residual deficits: Secondary | ICD-10-CM | POA: Insufficient documentation

## 2018-07-27 DIAGNOSIS — Z953 Presence of xenogenic heart valve: Secondary | ICD-10-CM | POA: Diagnosis not present

## 2018-07-27 DIAGNOSIS — Z8249 Family history of ischemic heart disease and other diseases of the circulatory system: Secondary | ICD-10-CM | POA: Insufficient documentation

## 2018-07-27 DIAGNOSIS — K219 Gastro-esophageal reflux disease without esophagitis: Secondary | ICD-10-CM | POA: Diagnosis not present

## 2018-07-27 DIAGNOSIS — I2510 Atherosclerotic heart disease of native coronary artery without angina pectoris: Secondary | ICD-10-CM | POA: Diagnosis not present

## 2018-07-27 DIAGNOSIS — H9190 Unspecified hearing loss, unspecified ear: Secondary | ICD-10-CM | POA: Diagnosis not present

## 2018-07-27 DIAGNOSIS — I130 Hypertensive heart and chronic kidney disease with heart failure and stage 1 through stage 4 chronic kidney disease, or unspecified chronic kidney disease: Secondary | ICD-10-CM | POA: Insufficient documentation

## 2018-07-27 DIAGNOSIS — I5022 Chronic systolic (congestive) heart failure: Secondary | ICD-10-CM | POA: Diagnosis not present

## 2018-07-27 DIAGNOSIS — Z79899 Other long term (current) drug therapy: Secondary | ICD-10-CM | POA: Insufficient documentation

## 2018-07-27 DIAGNOSIS — D509 Iron deficiency anemia, unspecified: Secondary | ICD-10-CM | POA: Diagnosis not present

## 2018-07-27 LAB — BASIC METABOLIC PANEL
ANION GAP: 9 (ref 5–15)
BUN: 30 mg/dL — ABNORMAL HIGH (ref 8–23)
CALCIUM: 8.9 mg/dL (ref 8.9–10.3)
CO2: 22 mmol/L (ref 22–32)
Chloride: 108 mmol/L (ref 98–111)
Creatinine, Ser: 2.07 mg/dL — ABNORMAL HIGH (ref 0.61–1.24)
GFR calc non Af Amer: 30 mL/min — ABNORMAL LOW (ref >60)
GFR, EST AFRICAN AMERICAN: 35 mL/min — AB (ref >60)
GLUCOSE: 115 mg/dL — AB (ref 70–99)
POTASSIUM: 4.6 mmol/L (ref 3.5–5.1)
Sodium: 139 mmol/L (ref 135–145)

## 2018-07-27 MED ORDER — TRAZODONE HCL 50 MG PO TABS: 50.0000 mg | ORAL_TABLET | Freq: Every evening | ORAL | 1 refills | Status: DC | PRN

## 2018-07-27 NOTE — Patient Instructions (Addendum)
Labs today (will call for abnormal results, otherwise no news is good news)  STOP taking Amiodarone  START taking trazodone 50 mg as needed at bedtime for insomnia.  Future refills will need to come from PCP, please discuss at your next follow up .   Please discuss with your wife about possible ICD placement.  Call our triage line at [phone], Option 5 if you want to go forward with referral.   Follow up in 3 Months

## 2018-07-30 ENCOUNTER — Telehealth (HOSPITAL_COMMUNITY): Payer: Self-pay | Admitting: *Deleted

## 2018-07-30 DIAGNOSIS — I5022 Chronic systolic (congestive) heart failure: Secondary | ICD-10-CM

## 2018-07-30 NOTE — Progress Notes (Signed)
Advanced Heart Failure Clinic Note   PCP: Dr. Justin Mend Cardiology: Dr. Radford Pax HF Cardiology: Dr. Aundra Dubin  72 y.o.with history of CVA, paroxysmal atrial fibrillation, valvular heart disease, and chronic diastolic CHF presents for evaluation of CHF and valvular heart disease.    He had had episodes of dyspnea and initial workup led to a TEE in 5/18 showing normal EF with moderate AS and moderate MR.  He continued to have episodic dyspnea and ended up admitted in 8/18 with shortness of breath and chest pressure.  This led to a long, complicated hospitalization.  He was noted to be volume overloaded with acute diastolic CHF and was also noted to have symptomatic runs of atrial fibrillation with RVR.  Amiodarone was started to control the atrial fibrillation.  He developed respiratory distress => Bipap => intubated.  He became hypotensive, requiring pressors.  He developed AKI.  PNA was noted and he received broad spectrum abx.  He had a prolonged intubation but was eventually extubated.  Given deconditioning from long hospitalization, he went to CIR for a couple of weeks.  LHC in 9/18 showed occluded PDA with left>right collaterals.  TEE in 9/18 showed EF 50%, severe MR (possibly infarct-related with restricted posterior leaflet, and moderate AS + moderate-severe AI (possibly rheumatic).   In 12/18, he had cardiac surgery with bioprosthetic aortic and mitral valves placed.  He had SVG-PDA, Maze, and LA appendage clipping.  Post-op course was complicated by CHB requiring Medtronic dual chamber PPM (His bundle lead placed but captured right bundle so induced LBBB).   Echo 01/02/18 LVEF 30-35%, bioprosthetic MV and AoV function normally, RV mildly dilated with normal systolic function.    He was noted to be in atrial flutter with mild RVR and had TEE-guided DCCV on 04/11/18. TEE showed EF 25-30%, normal bioprosthetic aortic and mitral valves.  Echo in 6/19 showed EF 30-35%, normal bioprosthetic aortic and mitral  valves.   He returns today for followup of CHF and valvular disease.  He has been doing well symptomatically.  No palpitations.  No significant exertional dyspnea.  No orthopnea/PND.  No chest pain.  Weight is up but he says that he has had more of an appetite in recent weeks.     Labs (9/18): K 4.5, creatinine 1.42, LDL 90, HDL 36, LFTs normal, TSH normal, hgb 9.6 Labs (10/18): K 4.3, creatinine 1.45 Labs (12/18): K 3.8, creatinine 1.56 Labs (1/19): creatinine 1.68, LDL 66 Labs (3/19): K 4.2, creatinine 1.56 Labs (4/19): K 4.4, creatinine 1.71, hgb 11.8, LDL 75, HDL 43 Labs (6/19): K 4.8, creatinine 2.1, LDL 75, TGs 246  ECG (7/19, personally reviewed): a-paced, IVCD 124 msec  PMH: 1. CVA: Right MCA, 2004.  Minimal residual problems.  2. HTN 3. Hyperlipidemia 4. Left rotator cuff surgery 6/18 5. Carotid stenosis: s/p right carotid stent.  6. GERD 7. H/o CCY 8. Anemia: FOBT negative.  9. Gout  10. Atrial fibrillation: Paroxysmal. Maze in 12/18 with LAA clipping.  11. Valvular heart disease: TEE (5/18) with EF 55-60%, moderate AS mean 33 mmHg, moderate MR, normal RV size and systolic function.  - Echo (8/18): EF 55-60%, moderate LVH, moderate AS with mean gradient 33 mmHg, moderate AI, moderate to severe MR.  - TEE (9/18): EF 50%, mild LV dilation, suspect severe MR with posterior leaflet restricted (?infarct-related MR), ERO 0.4 cm^2, moderate AS/moderate-severe AI (possibly rheumatic).   - In 12/18, he had cardiac surgery with bioprosthetic aortic and mitral valves placed.  He had SVG-PDA, Maze, and  LA appendage clipping. - Echo (1/19): LVEF 30-35%, bioprosthetic MV and AoV function normally, RV mildly dilated with normal systolic function.  - TEE (5/19) with EF 25-30%, normal bioprosthetic aortic valve, normal bioprosthetic mitral valve. - Echo (6/19): EF 30-35%, mild LV dilation with diffuse hypokinesis, normal RV size with mildly decreased systolic function, normal-appearing  bioprosthetic mitral and aortic valves.   12. Chronic systolic CHF 13. Deafness 14. CKD: stage 3.  15. 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  17. COPD: Mild obstruction on 9/18 PFTs.  18. Post-op CHB in 12/18 with Medtronic dual chamber PPM.  19. Chronic systolic CHF: Echo (6/07) with EF down to 30-35% post-op.  - TEE (5/19) with EF 25-30%, normal bioprosthetic aortic valve, normal bioprosthetic mitral valve.  20. Atrial flutter: DCCV in 5/19.   SH: Quit smoking 8/18.  Married, 2 children, retired.    Family History  Problem Relation Age of Onset  . Stroke Father        No details  . Angina Mother    Review of systems complete and found to be negative unless listed in HPI.    Current Outpatient Medications  Medication Sig Dispense Refill  . acetaminophen (TYLENOL) 325 MG tablet Take 1 tablet (325 mg total) by mouth every 6 (six) hours as needed for mild pain. 30 tablet 0  . calcium carbonate (TUMS - DOSED IN MG ELEMENTAL CALCIUM) 500 MG chewable tablet Chew 1 tablet by mouth daily as needed for indigestion or heartburn.    . carvedilol (COREG) 6.25 MG tablet Take 2 tablets (12.5 mg total) by mouth 2 (two) times daily. 120 tablet 3  . cetirizine (ZYRTEC) 10 MG tablet Take 10 mg by mouth daily as needed for allergies.    . citalopram (CELEXA) 20 MG tablet Take 1 tablet (20 mg total) by mouth at bedtime. 30 tablet 5  . docusate sodium (COLACE) 100 MG capsule Take 1 capsule (100 mg total) by mouth 3 (three) times daily as needed. 20 capsule 0  . furosemide (LASIX) 40 MG tablet Take 1 tablet (40 mg total) by mouth daily. 30 tablet 3  . ipratropium (ATROVENT) 0.03 % nasal spray Place 2 sprays into both nostrils 2 (two) times daily.     [name] LORazepam (ATIVAN) 0.5 MG tablet Take 1 tablet (0.5 mg total) by mouth every 6 (six) hours as needed for anxiety  (aggitation). 20 tablet 0  . lovastatin (MEVACOR) 40 MG tablet TAKE 1 TABLET AT BEDTIME  (NEW  DOSE) 90 tablet 3  . ondansetron (ZOFRAN) 4 MG tablet Take 4 mg by mouth every 8 (eight) hours as needed for nausea or vomiting.    . pantoprazole (PROTONIX) 40 MG tablet Take 1 tablet (40 mg total) by mouth daily. 30 tablet 1  . sacubitril-valsartan (ENTRESTO) 24-26 MG Take 1 tablet by mouth 2 (two) times daily. 180 tablet 3  . spironolactone (ALDACTONE) 25 MG tablet Take 1 tablet (25 mg total) by mouth daily. 30 tablet 6  . traZODone (DESYREL) 50 MG tablet Take 100 mg by mouth at bedtime as needed for sleep.    [name] warfarin (COUMADIN) 5 MG tablet TAKE 1 TABLET BY MOUTH ONCE DAILY AT 6 PM 100 tablet 1   No current facility-administered medications for this encounter.    Vitals:   07/27/18 1110  BP: 127/66  Pulse: (!) 58  SpO2: 100%  Weight: 116.6 kg (257 lb)   Wt Readings from Last 3 Encounters:  07/27/18 116.6 kg (257 lb)  06/21/18 114.2 kg (251 lb 12.8 oz)  06/06/18 114.8 kg (253 lb)   General: NAD Neck: No JVD, no thyromegaly or thyroid nodule.  Lungs: Clear to auscultation bilaterally with normal respiratory effort. CV: Nondisplaced PMI.  Heart regular S1/S2, no S3/S4, 2/6 early SEM RUSB.  No peripheral edema.  No carotid bruit.  Normal pedal pulses.  Abdomen: Soft, nontender, no hepatosplenomegaly, no distention.  Skin: Intact without lesions or rashes.  Neurologic: Alert and oriented x 3.  Psych: Normal affect. Extremities: No clubbing or cyanosis.  HEENT: Normal.   Assessment/Plan: 1. Valvular heart disease: TEE in 9/18 showed severe MR, probably infarct-related with restrictive posterior mitral leaflet. There was moderate aortic stenosis and moderate-severe aortic insufficiency, possible rheumatic.  In 12/18, he had bioprosthetic MVR and AVR.  - Valves stable on 6/19 echo.  2. Chronic combined CHF: In setting of significant valvular disease as above. EF down to 30-35% post-op  (1/19 echo), likely reflects true LV systolic function without the volume load from severe MR and moderate-severe AI.  TEE in 5/19 showed EF 25-30%.  Repeat echo in 6/19 with EF 30-35%. He is not volume overloaded on exam.  NYHA class II symptoms.  - Continue Lasix 40 mg daily.  BMET today.    - Continue Coreg 12.5 bid. Will not increase with HR in 50s.  - Continue Entresto 24/26 bid. Will not increase at this time with elevated creatinine.  - Continue spironolactone 25 mg daily.   - He is a-pacing today but not v-pacing.   - We discussed ICD today, he is a candidate.  He wants to talk more with his wife before making a decision.  He will get back to me about it.     3. CKD: Stage 3. BMET today.  4. Atrial fibrillation/flutter: Paroxysmal. He has had a Maze procedure.  He tends to tolerate atrial arrhythmias poorly.  Most recently, he had atrial flutter with DCCV in 5/19.  He is now maintaining NSR.  - He is on warfarin.  - He can stop amiodarone today.   5. COPD: Mild obstruction on PFTs.  He has quit smoking.  Stable. No change.   6. CVA: Remote, minimal residual. S/p carotid stenting.  - No ASA given warfarin use.  7. Hyperlipidemia: Good lipids 4/19.    8. Anemia: Fe deficiency.  FOBT negative 8/18.  Got feraheme in 8/18.   9. 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  The artery to the PDA was occluded with left to right collaterals.  He had SVG-PDA in 12/18. No chest pain.  - Continue statin.   - No ASA given warfarin use.  10. CHB: has Medtronic PPM.  Minimal RV pacing now.   Followup in 3 months.   Loralie Champagne 07/30/2018

## 2018-07-30 NOTE — Telephone Encounter (Signed)
Patient's wife called and said they would like to move forward with seeing EP for possible ICD placement.  Referral placed and patient's knows they will call them to schedule appt.

## 2018-08-03 MED FILL — IPRATROPIUM 0.03% SPRAY: 0.03 | 43 days supply | Qty: 30 | Fill #1

## 2018-08-06 ENCOUNTER — Ambulatory Visit: Payer: Medicare HMO | Admitting: Internal Medicine

## 2018-08-06 ENCOUNTER — Encounter: Payer: Self-pay | Admitting: Internal Medicine

## 2018-08-06 VITALS — BP 118/74 | HR 61 | Ht 68.5 in | Wt 252.4 lb

## 2018-08-06 DIAGNOSIS — I480 Paroxysmal atrial fibrillation: Secondary | ICD-10-CM

## 2018-08-06 DIAGNOSIS — I442 Atrioventricular block, complete: Secondary | ICD-10-CM | POA: Diagnosis not present

## 2018-08-06 DIAGNOSIS — I2510 Atherosclerotic heart disease of native coronary artery without angina pectoris: Secondary | ICD-10-CM

## 2018-08-06 DIAGNOSIS — Z950 Presence of cardiac pacemaker: Secondary | ICD-10-CM

## 2018-08-06 NOTE — Progress Notes (Signed)
HPI [name] is referred back today to consider upgrade of his DDD PM to a DDD/BiV ICD. The patient underwent valve surgery several months ago complicated by heart block for which he underwent DDD PM insertion with a His bundle lead. Unfortunately, his lead placement was on the RB and in pacing the RB he had pacing induced LBBB. His EF has fallen and now in the 35% ranged. His intrinsic QRS is around 120 with a very long PR interval.  He has class 1 symptoms at present. No syncope. No edema.  Allergies  Allergen Reactions  . Bee Venom Anaphylaxis and Swelling     Current Outpatient Medications  Medication Sig Dispense Refill  . acetaminophen (TYLENOL) 325 MG tablet Take 1 tablet (325 mg total) by mouth every 6 (six) hours as needed for mild pain. 30 tablet 0  . calcium carbonate (TUMS - DOSED IN MG ELEMENTAL CALCIUM) 500 MG chewable tablet Chew 1 tablet by mouth daily as needed for indigestion or heartburn.    . carvedilol (COREG) 6.25 MG tablet Take 2 tablets (12.5 mg total) by mouth 2 (two) times daily. 120 tablet 3  . cetirizine (ZYRTEC) 10 MG tablet Take 10 mg by mouth daily as needed for allergies.    . citalopram (CELEXA) 20 MG tablet Take 1 tablet (20 mg total) by mouth at bedtime. 30 tablet 5  . docusate sodium (COLACE) 100 MG capsule Take 1 capsule (100 mg total) by mouth 3 (three) times daily as needed. 20 capsule 0  . furosemide (LASIX) 40 MG tablet Take 1 tablet (40 mg total) by mouth daily. 30 tablet 3  . ipratropium (ATROVENT) 0.03 % nasal spray Place 2 sprays into both nostrils 2 (two) times daily.     [name] LORazepam (ATIVAN) 0.5 MG tablet Take 1 tablet (0.5 mg total) by mouth every 6 (six) hours as needed for anxiety (aggitation). 20 tablet 0  . lovastatin (MEVACOR) 40 MG tablet TAKE 1 TABLET AT BEDTIME  (NEW  DOSE) 90 tablet 3  . ondansetron (ZOFRAN) 4 MG tablet Take 4 mg by mouth every 8 (eight) hours as needed for nausea or vomiting.    . pantoprazole (PROTONIX) 40  MG tablet Take 1 tablet (40 mg total) by mouth daily. 30 tablet 1  . sacubitril-valsartan (ENTRESTO) 24-26 MG Take 1 tablet by mouth 2 (two) times daily. 180 tablet 3  . spironolactone (ALDACTONE) 25 MG tablet Take 1 tablet (25 mg total) by mouth daily. 30 tablet 6  . traZODone (DESYREL) 50 MG tablet Take 100 mg by mouth at bedtime as needed for sleep.    [name] warfarin (COUMADIN) 5 MG tablet TAKE 1 TABLET BY MOUTH ONCE DAILY AT 6 PM 100 tablet 1   No current facility-administered medications for this visit.      Past Medical History:  Diagnosis Date  . Anemia 07/13/2017  . Anxiety   . Aortic insufficiency   . Aortic stenosis, moderate 07/13/2017  . Arthritis    back   . Carotid stenosis    Right carotid stent (widely patent) 40 - 59% left plaque 11/13  . COPD (chronic obstructive pulmonary disease) ([place])   . Coronary artery disease involving native coronary artery of native heart without angina pectoris   . Depression   . Dyslipidemia   . GERD (gastroesophageal reflux disease)   . Heart murmur   . Hemiplegia affecting unspecified side, late effect of cerebrovascular disease    resolved- from L side   .  Hypertension   . Jaundice    resolved following ERCP & Cholecystectomy  . Mild emphysema ([place])   . Mitral regurgitation   . Mitral valve insufficiency and aortic valve insufficiency   . Myocardial infarction ([place])   . Paroxysmal atrial fibrillation ([facility])   . Pre-diabetes    per spouse  . S/P aortic valve replacement with bioprosthetic valve 11/16/2017   23 mm [facility] Ease stented bovine pericardial tissue valve  . S/P CABG x 1 11/16/2017   SVG to PDA with EVH via right thigh  . S/P Maze operation for atrial fibrillation 11/16/2017   Left side lesion set using bipolar radiofrequency and cryothermy ablation with clipping of LA appendage  . S/P mitral valve replacement with bioprosthetic valve 11/16/2017   27 mm [facility] Mitral stented bovine pericardial tissue valve  . Sleep  apnea    does not wear CPAP  . Sleep concern    resulted in surgery- after + sleep test. Pt. doesn't have a problem any longer.   . Stroke ([place]) 03/11/2003   stent placed on the 31, 3, 2004, L side   . Wears glasses   . Wears hearing aid in both ears   . Wears partial dentures     ROS:   All systems reviewed and negative except as noted in the HPI.   Past Surgical History:  Procedure Laterality Date  . AORTIC VALVE REPLACEMENT N/A 11/16/2017   Procedure: AORTIC VALVE REPLACEMENT (AVR) with 45mm Magna Ease;  Surgeon: Rexene Alberts, MD;  Location: [place];  Service: Open Heart Surgery;  Laterality: N/A;  . ARTERIAL LINE INSERTION Right 11/16/2017   Procedure: ARTERIAL LINE INSERTION -RIGHT FEMORAL;  Surgeon: Rexene Alberts, MD;  Location: [place];  Service: Open Heart Surgery;  Laterality: Right;  . BACK SURGERY     lumbar back  . CARDIOVERSION N/A 04/11/2018   Procedure: CARDIOVERSION;  Surgeon: Larey Dresser, MD;  Location: [facility] ENDOSCOPY;  Service: Cardiovascular;  Laterality: N/A;  . CHOLECYSTECTOMY    . CORONARY ARTERY BYPASS GRAFT N/A 11/16/2017   Procedure: CORONARY ARTERY BYPASS GRAFTING (CABG) x 1;  Surgeon: Rexene Alberts, MD;  Location: [place];  Service: Open Heart Surgery;  Laterality: N/A;  . ENDOVEIN HARVEST OF GREATER SAPHENOUS VEIN Right 11/16/2017   Procedure: ENDOVEIN HARVEST OF GREATER SAPHENOUS VEIN;  Surgeon: Rexene Alberts, MD;  Location: [place];  Service: Open Heart Surgery;  Laterality: Right;  . ERCP N/A 05/31/2013   Procedure: ENDOSCOPIC RETROGRADE CHOLANGIOPANCREATOGRAPHY (ERCP);  Surgeon: Ladene Artist, MD;  Location: [facility];  Service: Endoscopy;  Laterality: N/A;  . FOOT SURGERY     right  . IR RADIOLOGY PERIPHERAL GUIDED IV START  09/28/2017  . IR US GUIDE VASC ACCESS RIGHT  09/28/2017  . LAPAROSCOPIC CHOLECYSTECTOMY SINGLE PORT N/A 06/01/2013   Procedure: LAPAROSCOPIC CHOLECYSTECTOMY SINGLE PORT;  Surgeon: Adin Hector, MD;  Location: [facility];   Service: General;  Laterality: N/A;  . MAZE N/A 11/16/2017   Procedure: MAZE;  Surgeon: Rexene Alberts, MD;  Location: [place];  Service: Open Heart Surgery;  Laterality: N/A;  . MITRAL VALVE REPAIR N/A 11/16/2017   Procedure: MITRAL VALVE  REPLACEMENT with 42mm MagnaEase;  Surgeon: Rexene Alberts, MD;  Location: [place];  Service: Open Heart Surgery;  Laterality: N/A;  . PACEMAKER IMPLANT N/A 11/21/2017   Procedure: PACEMAKER IMPLANT;  Surgeon: Evans Lance, MD;  Location: [place] CV [facility];  Service: Cardiovascular;  Laterality:  N/A;  . POLYPECTOMY    . RIGHT/LEFT HEART CATH AND CORONARY ANGIOGRAPHY N/A 08/30/2017   Procedure: RIGHT/LEFT HEART CATH AND CORONARY ANGIOGRAPHY;  Surgeon: Larey Dresser, MD;  Location: [place] CV [facility];  Service: Cardiovascular;  Laterality: N/A;  . SHOULDER ARTHROSCOPY WITH ROTATOR CUFF REPAIR AND SUBACROMIAL DECOMPRESSION Left 05/18/2017  . SHOULDER ARTHROSCOPY WITH ROTATOR CUFF REPAIR AND SUBACROMIAL DECOMPRESSION Left 05/18/2017   Procedure: SHOULDER ARTHROSCOPY WITH ROTATOR CUFF REPAIR AND SUBACROMIAL DECOMPRESSION;  Surgeon: Tania Ade, MD;  Location: [place];  Service: Orthopedics;  Laterality: Left;  LEFT SHOULDER ARTHROSCOPY WITH ROTATOR CUFF REPAIR AND SUBACROMIAL DECOMPRESSION  . TEE WITHOUT CARDIOVERSION N/A 05/09/2017   Procedure: TRANSESOPHAGEAL ECHOCARDIOGRAM (TEE);  Surgeon: Skeet Latch, MD;  Location: [employer];  Service: Cardiovascular;  Laterality: N/A;  . TEE WITHOUT CARDIOVERSION N/A 08/30/2017   Procedure: TRANSESOPHAGEAL ECHOCARDIOGRAM (TEE);  Surgeon: Larey Dresser, MD;  Location: [facility] ENDOSCOPY;  Service: Cardiovascular;  Laterality: N/A;  . TEE WITHOUT CARDIOVERSION N/A 11/16/2017   Procedure: TRANSESOPHAGEAL ECHOCARDIOGRAM (TEE);  Surgeon: Rexene Alberts, MD;  Location: [place];  Service: Open Heart Surgery;  Laterality: N/A;  . TEE WITHOUT CARDIOVERSION N/A 04/11/2018   Procedure: TRANSESOPHAGEAL ECHOCARDIOGRAM (TEE);   Surgeon: Larey Dresser, MD;  Location: [facility] ENDOSCOPY;  Service: Cardiovascular;  Laterality: N/A;  . TONSILLECTOMY       Family History  Problem Relation Age of Onset  . Stroke Father        No details  . Angina Mother      Social History   Socioeconomic History  . Marital status: Married    Spouse name: Not on file  . Number of children: 3  . Years of education: Not on file  . Highest education level: Not on file  Occupational History    Employer: RETIRED  Social Needs  . Financial resource strain: Not on file  . Food insecurity:    Worry: Not on file    Inability: Not on file  . Transportation needs:    Medical: Not on file    Non-medical: Not on file  Tobacco Use  . Smoking status: Former Smoker    Packs/day: 0.50    Years: 57.00    Pack years: 28.50    Types: Cigarettes    Last attempt to quit: 07/13/1997    Years since quitting: 21.0  . Smokeless tobacco: Never Used  Substance and Sexual Activity  . Alcohol use: Yes    Alcohol/week: 2.0 - 3.0 standard drinks    Types: 2 - 3 Glasses of wine per week    Comment: moderate wine ; not since 8/ 2018  . Drug use: No  . Sexual activity: Yes  Lifestyle  . Physical activity:    Days per week: Not on file    Minutes per session: Not on file  . Stress: Not on file  Relationships  . Social connections:    Talks on phone: Not on file    Gets together: Not on file    Attends religious service: Not on file    Active member of club or organization: Not on file    Attends meetings of clubs or organizations: Not on file    Relationship status: Not on file  . Intimate partner violence:    Fear of current or ex partner: Not on file    Emotionally abused: Not on file    Physically abused: Not on file    Forced sexual activity: Not on file  Other Topics Concern  . Not on file  Social History Narrative   Two living children.  Lives with wife.       BP 118/74   Pulse 61   Ht 5' 8.5" (1.74 [place])   Wt 252 lb 6.4 oz  (114.5 kg)   SpO2 96%   BMI 37.82 kg/[place]   Physical Exam:  Well appearing 72 yo man, NAD HEENT: Unremarkable Neck:  6 cm JVD, no thyromegally Lymphatics:  No adenopathy Back:  No CVA tenderness Lungs:  Clear with no wheezes HEART:  Regular rate rhythm, no murmurs, no rubs, no clicks Abd:  soft, positive bowel sounds, no organomegally, no rebound, no guarding Ext:  2 plus pulses, no edema, no cyanosis, no clubbing Skin:  No rashes no nodules Neuro:  CN II through XII intact, motor grossly intact  EKG - nsr with atrial pacing and a long AV delay with minimal concution.  DEVICE  Normal device function.  See PaceArt for details.   Assess/Plan: 1. Worsening LV dysfunction  - his RV/His bundle pacing has been turned off. Previously he had pacing induced LBBB. I wonder if this could have caused worsening in the LV dysfunction. I will discuss with Dr. Aundra Dubin. Ideally we might be able to uptitrate his meds and with no pacing his EF might improve. If on the other hand his EF being down is due to the LV pumping without an MR let off, it will not likely get better. Because of his lack of symptoms, I think it would be reasonable to hold off on a device, consider adding aldactone, and repeating the echo in 3 months with no RV pacing.Brandon [name].D.

## 2018-08-06 NOTE — Patient Instructions (Signed)
Medication Instructions:  Your physician recommends that you continue on your current medications as directed. Please refer to the Current Medication list given to you today.  Labwork: None ordered.  Testing/Procedures: None ordered.  Follow-Up: Dr. Lovena Le will review your case and give you a call.   Any Other Special Instructions Will Be Listed Below (If Applicable).  If you need a refill on your cardiac medications before your next appointment, please call your pharmacy.

## 2018-08-08 ENCOUNTER — Ambulatory Visit (INDEPENDENT_AMBULATORY_CARE_PROVIDER_SITE_OTHER): Payer: Medicare HMO | Admitting: *Deleted

## 2018-08-08 DIAGNOSIS — Z951 Presence of aortocoronary bypass graft: Secondary | ICD-10-CM | POA: Diagnosis not present

## 2018-08-08 DIAGNOSIS — Z953 Presence of xenogenic heart valve: Secondary | ICD-10-CM

## 2018-08-08 DIAGNOSIS — I480 Paroxysmal atrial fibrillation: Secondary | ICD-10-CM | POA: Diagnosis not present

## 2018-08-08 DIAGNOSIS — Z8679 Personal history of other diseases of the circulatory system: Secondary | ICD-10-CM

## 2018-08-08 DIAGNOSIS — Z9889 Other specified postprocedural states: Secondary | ICD-10-CM | POA: Diagnosis not present

## 2018-08-08 DIAGNOSIS — Z5181 Encounter for therapeutic drug level monitoring: Secondary | ICD-10-CM

## 2018-08-08 LAB — POCT INR: INR: 2.8 (ref 2.0–3.0)

## 2018-08-08 NOTE — Patient Instructions (Signed)
Description   Continue taking 1 tablet everyday.  Recheck INR in 4 weeks.  Call with any new medications scheduled procedures or any questions  [phone]

## 2018-08-14 LAB — CUP PACEART INCLINIC DEVICE CHECK
Brady Statistic AP VP Percent: 0.04 %
Brady Statistic AP VS Percent: 57.81 %
Brady Statistic AS VP Percent: 0.06 %
Brady Statistic RA Percent Paced: 57.9 %
Brady Statistic RV Percent Paced: 0.1 %
Implantable Lead Implant Date: 20181211
Implantable Lead Location: 753859
Implantable Lead Location: 753860
Lead Channel Impedance Value: 285 Ohm
Lead Channel Impedance Value: 399 Ohm
Lead Channel Impedance Value: 399 Ohm
Lead Channel Pacing Threshold Pulse Width: 0.4 ms
Lead Channel Sensing Intrinsic Amplitude: 4.25 mV
Lead Channel Setting Pacing Amplitude: 2 V
Lead Channel Setting Pacing Amplitude: 2.5 V
Lead Channel Setting Sensing Sensitivity: 1.2 mV
MDC IDC LEAD IMPLANT DT: 20181211
MDC IDC MSMT BATTERY REMAINING LONGEVITY: 126 mo
MDC IDC MSMT BATTERY VOLTAGE: 3.06 V
MDC IDC MSMT LEADCHNL RA IMPEDANCE VALUE: 342 Ohm
MDC IDC MSMT LEADCHNL RA PACING THRESHOLD AMPLITUDE: 0.75 V
MDC IDC MSMT LEADCHNL RA PACING THRESHOLD PULSEWIDTH: 0.4 ms
MDC IDC MSMT LEADCHNL RA SENSING INTR AMPL: 4.75 mV
MDC IDC MSMT LEADCHNL RV PACING THRESHOLD AMPLITUDE: 1.75 V
MDC IDC PG IMPLANT DT: 20181211
MDC IDC SESS DTM: 20190826205159
MDC IDC SET LEADCHNL RV PACING PULSEWIDTH: 1 ms
MDC IDC STAT BRADY AS VS PERCENT: 42.09 %

## 2018-08-17 ENCOUNTER — Telehealth: Payer: Self-pay

## 2018-08-17 DIAGNOSIS — I5022 Chronic systolic (congestive) heart failure: Secondary | ICD-10-CM

## 2018-08-17 NOTE — Telephone Encounter (Signed)
Left message.  Per Dr. Lovena Le- repeat 2D echo beginning of December and then follow up with him after test.  Left detailed message.  Will enter order and make appt.

## 2018-08-28 ENCOUNTER — Telehealth: Payer: Self-pay | Admitting: Cardiology

## 2018-08-28 ENCOUNTER — Ambulatory Visit (INDEPENDENT_AMBULATORY_CARE_PROVIDER_SITE_OTHER): Payer: Medicare HMO | Admitting: *Deleted

## 2018-08-28 DIAGNOSIS — I442 Atrioventricular block, complete: Secondary | ICD-10-CM

## 2018-08-28 NOTE — Telephone Encounter (Signed)
Confirmed remote transmission w/ pt wife.   

## 2018-08-28 NOTE — Progress Notes (Signed)
Remote pacemaker transmission.   

## 2018-09-03 MED FILL — ENTRESTO 24 MG-26 MG TABLET: 24-26 | 30 days supply | Qty: 60 | Fill #2

## 2018-09-04 DIAGNOSIS — Z23 Encounter for immunization: Secondary | ICD-10-CM | POA: Diagnosis not present

## 2018-09-05 ENCOUNTER — Ambulatory Visit (INDEPENDENT_AMBULATORY_CARE_PROVIDER_SITE_OTHER): Payer: Medicare HMO | Admitting: *Deleted

## 2018-09-05 DIAGNOSIS — Z9889 Other specified postprocedural states: Secondary | ICD-10-CM

## 2018-09-05 DIAGNOSIS — Z951 Presence of aortocoronary bypass graft: Secondary | ICD-10-CM | POA: Diagnosis not present

## 2018-09-05 DIAGNOSIS — Z953 Presence of xenogenic heart valve: Secondary | ICD-10-CM

## 2018-09-05 DIAGNOSIS — Z8679 Personal history of other diseases of the circulatory system: Secondary | ICD-10-CM | POA: Diagnosis not present

## 2018-09-05 DIAGNOSIS — I480 Paroxysmal atrial fibrillation: Secondary | ICD-10-CM | POA: Diagnosis not present

## 2018-09-05 DIAGNOSIS — Z5181 Encounter for therapeutic drug level monitoring: Secondary | ICD-10-CM | POA: Diagnosis not present

## 2018-09-05 LAB — POCT INR: INR: 2.7 (ref 2.0–3.0)

## 2018-09-05 NOTE — Patient Instructions (Addendum)
Description   Continue taking 1 tablet everyday.  Recheck INR in 5 weeks.  Call with any new medications scheduled procedures or any questions  [phone]

## 2018-09-18 ENCOUNTER — Telehealth: Payer: Self-pay | Admitting: Internal Medicine

## 2018-09-18 NOTE — Telephone Encounter (Signed)
New Message   Pt's wife called to schedule pt's echo. Scheduled  it for 10/17 and wants to make sure that isn't too early of a date since he doesn't see Lovena Le until December. Please call

## 2018-09-18 NOTE — Telephone Encounter (Signed)
Scheduling to call to reschedule ECHO for first part of December.

## 2018-09-19 LAB — CUP PACEART REMOTE DEVICE CHECK
Brady Statistic AP VS Percent: 49.02 %
Brady Statistic AS VP Percent: 0.06 %
Date Time Interrogation Session: 20190917150320
Implantable Lead Model: 5076
Lead Channel Impedance Value: 323 Ohm
Lead Channel Pacing Threshold Pulse Width: 0.4 ms
Lead Channel Sensing Intrinsic Amplitude: 3.25 mV
Lead Channel Sensing Intrinsic Amplitude: 3.25 mV
Lead Channel Sensing Intrinsic Amplitude: 4.125 mV
Lead Channel Setting Pacing Pulse Width: 1 ms
MDC IDC LEAD IMPLANT DT: 20181211
MDC IDC LEAD IMPLANT DT: 20181211
MDC IDC LEAD LOCATION: 753859
MDC IDC LEAD LOCATION: 753860
MDC IDC MSMT BATTERY REMAINING LONGEVITY: 126 mo
MDC IDC MSMT BATTERY VOLTAGE: 3.05 V
MDC IDC MSMT LEADCHNL RA IMPEDANCE VALUE: 380 Ohm
MDC IDC MSMT LEADCHNL RA PACING THRESHOLD AMPLITUDE: 0.625 V
MDC IDC MSMT LEADCHNL RA PACING THRESHOLD PULSEWIDTH: 0.4 ms
MDC IDC MSMT LEADCHNL RA SENSING INTR AMPL: 4.125 mV
MDC IDC MSMT LEADCHNL RV IMPEDANCE VALUE: 285 Ohm
MDC IDC MSMT LEADCHNL RV IMPEDANCE VALUE: 380 Ohm
MDC IDC MSMT LEADCHNL RV PACING THRESHOLD AMPLITUDE: 1.375 V
MDC IDC PG IMPLANT DT: 20181211
MDC IDC SET LEADCHNL RA PACING AMPLITUDE: 2 V
MDC IDC SET LEADCHNL RV PACING AMPLITUDE: 2.5 V
MDC IDC SET LEADCHNL RV SENSING SENSITIVITY: 1.2 mV
MDC IDC STAT BRADY AP VP PERCENT: 0.03 %
MDC IDC STAT BRADY AS VS PERCENT: 50.89 %
MDC IDC STAT BRADY RA PERCENT PACED: 49.13 %
MDC IDC STAT BRADY RV PERCENT PACED: 0.09 %

## 2018-09-27 ENCOUNTER — Other Ambulatory Visit (HOSPITAL_COMMUNITY): Payer: Medicare HMO

## 2018-09-28 ENCOUNTER — Other Ambulatory Visit (HOSPITAL_COMMUNITY): Payer: Self-pay | Admitting: Cardiology

## 2018-09-28 DIAGNOSIS — I5022 Chronic systolic (congestive) heart failure: Secondary | ICD-10-CM

## 2018-10-08 MED FILL — ENTRESTO 24 MG-26 MG TABLET: 24-26 | 30 days supply | Qty: 60 | Fill #3

## 2018-10-08 MED FILL — IPRATROPIUM 0.03% SPRAY: 0.03 | 43 days supply | Qty: 30 | Fill #2

## 2018-10-10 ENCOUNTER — Ambulatory Visit (INDEPENDENT_AMBULATORY_CARE_PROVIDER_SITE_OTHER): Payer: Medicare HMO | Admitting: *Deleted

## 2018-10-10 DIAGNOSIS — Z953 Presence of xenogenic heart valve: Secondary | ICD-10-CM | POA: Diagnosis not present

## 2018-10-10 DIAGNOSIS — Z8679 Personal history of other diseases of the circulatory system: Secondary | ICD-10-CM

## 2018-10-10 DIAGNOSIS — I480 Paroxysmal atrial fibrillation: Secondary | ICD-10-CM

## 2018-10-10 DIAGNOSIS — Z951 Presence of aortocoronary bypass graft: Secondary | ICD-10-CM | POA: Diagnosis not present

## 2018-10-10 DIAGNOSIS — Z5181 Encounter for therapeutic drug level monitoring: Secondary | ICD-10-CM | POA: Diagnosis not present

## 2018-10-10 DIAGNOSIS — Z9889 Other specified postprocedural states: Secondary | ICD-10-CM

## 2018-10-10 LAB — POCT INR: INR: 2.9 (ref 2.0–3.0)

## 2018-10-10 NOTE — Patient Instructions (Signed)
Description   Continue taking 1 tablet everyday.  Recheck INR in 6 weeks.  Call with any new medications scheduled procedures or any questions  [phone]

## 2018-10-26 ENCOUNTER — Ambulatory Visit (HOSPITAL_COMMUNITY)
Admission: RE | Admit: 2018-10-26 | Discharge: 2018-10-26 | Disposition: A | Discharge status: Home / Self Care | Payer: Medicare HMO | Source: Ambulatory Visit | Attending: Cardiology | Admitting: Cardiology

## 2018-10-26 ENCOUNTER — Other Ambulatory Visit (HOSPITAL_COMMUNITY): Payer: Self-pay

## 2018-10-26 VITALS — BP 148/76 | HR 75 | Wt 264.6 lb

## 2018-10-26 DIAGNOSIS — Z7901 Long term (current) use of anticoagulants: Secondary | ICD-10-CM | POA: Insufficient documentation

## 2018-10-26 DIAGNOSIS — I352 Nonrheumatic aortic (valve) stenosis with insufficiency: Secondary | ICD-10-CM | POA: Insufficient documentation

## 2018-10-26 DIAGNOSIS — N183 Chronic kidney disease, stage 3 (moderate): Secondary | ICD-10-CM | POA: Insufficient documentation

## 2018-10-26 DIAGNOSIS — K219 Gastro-esophageal reflux disease without esophagitis: Secondary | ICD-10-CM | POA: Insufficient documentation

## 2018-10-26 DIAGNOSIS — E785 Hyperlipidemia, unspecified: Secondary | ICD-10-CM | POA: Diagnosis not present

## 2018-10-26 DIAGNOSIS — I5032 Chronic diastolic (congestive) heart failure: Secondary | ICD-10-CM | POA: Diagnosis present

## 2018-10-26 DIAGNOSIS — Z79899 Other long term (current) drug therapy: Secondary | ICD-10-CM | POA: Diagnosis not present

## 2018-10-26 DIAGNOSIS — Z953 Presence of xenogenic heart valve: Secondary | ICD-10-CM | POA: Insufficient documentation

## 2018-10-26 DIAGNOSIS — I5022 Chronic systolic (congestive) heart failure: Secondary | ICD-10-CM | POA: Diagnosis not present

## 2018-10-26 DIAGNOSIS — D509 Iron deficiency anemia, unspecified: Secondary | ICD-10-CM | POA: Insufficient documentation

## 2018-10-26 DIAGNOSIS — I480 Paroxysmal atrial fibrillation: Secondary | ICD-10-CM | POA: Diagnosis not present

## 2018-10-26 DIAGNOSIS — Z87891 Personal history of nicotine dependence: Secondary | ICD-10-CM | POA: Diagnosis not present

## 2018-10-26 DIAGNOSIS — Z8673 Personal history of transient ischemic attack (TIA), and cerebral infarction without residual deficits: Secondary | ICD-10-CM | POA: Insufficient documentation

## 2018-10-26 DIAGNOSIS — I2510 Atherosclerotic heart disease of native coronary artery without angina pectoris: Secondary | ICD-10-CM | POA: Insufficient documentation

## 2018-10-26 DIAGNOSIS — J449 Chronic obstructive pulmonary disease, unspecified: Secondary | ICD-10-CM | POA: Diagnosis not present

## 2018-10-26 DIAGNOSIS — I4892 Unspecified atrial flutter: Secondary | ICD-10-CM | POA: Insufficient documentation

## 2018-10-26 DIAGNOSIS — I130 Hypertensive heart and chronic kidney disease with heart failure and stage 1 through stage 4 chronic kidney disease, or unspecified chronic kidney disease: Secondary | ICD-10-CM | POA: Insufficient documentation

## 2018-10-26 DIAGNOSIS — Z951 Presence of aortocoronary bypass graft: Secondary | ICD-10-CM

## 2018-10-26 DIAGNOSIS — M109 Gout, unspecified: Secondary | ICD-10-CM | POA: Diagnosis not present

## 2018-10-26 DIAGNOSIS — I5042 Chronic combined systolic (congestive) and diastolic (congestive) heart failure: Secondary | ICD-10-CM | POA: Diagnosis not present

## 2018-10-26 DIAGNOSIS — I442 Atrioventricular block, complete: Secondary | ICD-10-CM | POA: Diagnosis not present

## 2018-10-26 LAB — BASIC METABOLIC PANEL
Anion gap: 10 (ref 5–15)
BUN: 22 mg/dL (ref 8–23)
CALCIUM: 9.2 mg/dL (ref 8.9–10.3)
CHLORIDE: 105 mmol/L (ref 98–111)
CO2: 24 mmol/L (ref 22–32)
CREATININE: 1.79 mg/dL — AB (ref 0.61–1.24)
GFR calc non Af Amer: 36 mL/min — ABNORMAL LOW (ref >60)
GFR, EST AFRICAN AMERICAN: 42 mL/min — AB (ref >60)
GLUCOSE: 111 mg/dL — AB (ref 70–99)
Potassium: 4.6 mmol/L (ref 3.5–5.1)
Sodium: 139 mmol/L (ref 135–145)

## 2018-10-26 MED ORDER — CARVEDILOL 6.25 MG PO TABS: ORAL_TABLET | ORAL | 3 refills | Status: DC

## 2018-10-26 NOTE — Patient Instructions (Signed)
Take Carvedilol 1.5 tablets twice daily for 1 week. If you feel ok increase to 2 tablets twice daily. ( call our office in 1 week to let us know if you feel ok to increase)  Routine lab work today. Will notify you of abnormal results  Follow up in 3 months.

## 2018-10-28 NOTE — Progress Notes (Signed)
Advanced Heart Failure Clinic Note   PCP: Dr. Justin Mend Cardiology: Dr. Radford Pax HF Cardiology: Dr. Aundra Dubin  72 y.o.with history of CVA, paroxysmal atrial fibrillation, valvular heart disease, and chronic diastolic CHF presents for evaluation of CHF and valvular heart disease.    He had had episodes of dyspnea and initial workup led to a TEE in 5/18 showing normal EF with moderate AS and moderate MR.  He continued to have episodic dyspnea and ended up admitted in 8/18 with shortness of breath and chest pressure.  This led to a long, complicated hospitalization.  He was noted to be volume overloaded with acute diastolic CHF and was also noted to have symptomatic runs of atrial fibrillation with RVR.  Amiodarone was started to control the atrial fibrillation.  He developed respiratory distress => Bipap => intubated.  He became hypotensive, requiring pressors.  He developed AKI.  PNA was noted and he received broad spectrum abx.  He had a prolonged intubation but was eventually extubated.  Given deconditioning from long hospitalization, he went to CIR for a couple of weeks.  LHC in 9/18 showed occluded PDA with left>right collaterals.  TEE in 9/18 showed EF 50%, severe MR (possibly infarct-related with restricted posterior leaflet, and moderate AS + moderate-severe AI (possibly rheumatic).   In 12/18, he had cardiac surgery with bioprosthetic aortic and mitral valves placed.  He had SVG-PDA, Maze, and LA appendage clipping.  Post-op course was complicated by CHB requiring Medtronic dual chamber PPM (His bundle lead placed but captured right bundle so induced LBBB).   Echo 01/02/18 LVEF 30-35%, bioprosthetic MV and AoV function normally, RV mildly dilated with normal systolic function.    He was noted to be in atrial flutter with mild RVR and had TEE-guided DCCV on 04/11/18. TEE showed EF 25-30%, normal bioprosthetic aortic and mitral valves.  Echo in 6/19 showed EF 30-35%, normal bioprosthetic aortic and mitral  valves.   He returns today for followup of CHF and valvular disease.  He seems to be doing well overall.  No dyspnea walking on flat ground. Does yardwork regularly. No orthopnea/PND.  No chest pain.  He is v-pacing only rarely on device interrogation.  Weight is up about 7 lbs.   Labs (9/18): K 4.5, creatinine 1.42, LDL 90, HDL 36, LFTs normal, TSH normal, hgb 9.6 Labs (10/18): K 4.3, creatinine 1.45 Labs (12/18): K 3.8, creatinine 1.56 Labs (1/19): creatinine 1.68, LDL 66 Labs (3/19): K 4.2, creatinine 1.56 Labs (4/19): K 4.4, creatinine 1.71, hgb 11.8, LDL 75, HDL 43 Labs (6/19): K 4.8, creatinine 2.1, LDL 75, TGs 246 Labs (8/19): K 4.6, creatinine 2.07  Medtronic device interrogation: 0.1% RV pacing, 46% a-pacing. No atrial fibrillation.   PMH: 1. CVA: Right MCA, 2004.  Minimal residual problems.  2. HTN 3. Hyperlipidemia 4. Left rotator cuff surgery 6/18 5. Carotid stenosis: s/p right carotid stent.  6. GERD 7. H/o CCY 8. Anemia: FOBT negative.  9. Gout  10. Atrial fibrillation: Paroxysmal. Maze in 12/18 with LAA clipping.  11. Valvular heart disease: TEE (5/18) with EF 55-60%, moderate AS mean 33 mmHg, moderate MR, normal RV size and systolic function.  - Echo (8/18): EF 55-60%, moderate LVH, moderate AS with mean gradient 33 mmHg, moderate AI, moderate to severe MR.  - TEE (9/18): EF 50%, mild LV dilation, suspect severe MR with posterior leaflet restricted (?infarct-related MR), ERO 0.4 cm^2, moderate AS/moderate-severe AI (possibly rheumatic).   - In 12/18, he had cardiac surgery with bioprosthetic aortic and mitral  valves placed.  He had SVG-PDA, Maze, and LA appendage clipping. - Echo (1/19): LVEF 30-35%, bioprosthetic MV and AoV function normally, RV mildly dilated with normal systolic function.  - TEE (5/19) with EF 25-30%, normal bioprosthetic aortic valve, normal bioprosthetic mitral valve. - Echo (6/19): EF 30-35%, mild LV dilation with diffuse hypokinesis, normal RV  size with mildly decreased systolic function, normal-appearing bioprosthetic mitral and aortic valves.   12. Chronic systolic CHF 13. Deafness 14. CKD: stage 3.  15. 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  17. COPD: Mild obstruction on 9/18 PFTs.  18. Post-op CHB in 12/18 with Medtronic dual chamber PPM.  19. Chronic systolic CHF: Echo (0/93) with EF down to 30-35% post-op.  - TEE (5/19) with EF 25-30%, normal bioprosthetic aortic valve, normal bioprosthetic mitral valve.  20. Atrial flutter: DCCV in 5/19.   SH: Quit smoking 8/18.  Married, 2 children, retired.    Family History  Problem Relation Age of Onset  . Stroke Father        No details  . Angina Mother    Review of systems complete and found to be negative unless listed in HPI.    Current Outpatient Medications  Medication Sig Dispense Refill  . acetaminophen (TYLENOL) 325 MG tablet Take 1 tablet (325 mg total) by mouth every 6 (six) hours as needed for mild pain. 30 tablet 0  . calcium carbonate (TUMS - DOSED IN MG ELEMENTAL CALCIUM) 500 MG chewable tablet Chew 1 tablet by mouth daily as needed for indigestion or heartburn.    . cetirizine (ZYRTEC) 10 MG tablet Take 10 mg by mouth daily as needed for allergies.    . citalopram (CELEXA) 20 MG tablet Take 1 tablet (20 mg total) by mouth at bedtime. 30 tablet 5  . docusate sodium (COLACE) 100 MG capsule Take 1 capsule (100 mg total) by mouth 3 (three) times daily as needed. 20 capsule 0  . furosemide (LASIX) 40 MG tablet Take 1 tablet (40 mg total) by mouth daily. 30 tablet 3  . ipratropium (ATROVENT) 0.03 % nasal spray Place 2 sprays into both nostrils 2 (two) times daily.     [name] lovastatin (MEVACOR) 40 MG tablet TAKE 1 TABLET AT BEDTIME  (NEW  DOSE) 90 tablet 3  . ondansetron (ZOFRAN) 4 MG tablet Take 4 mg by mouth every 8 (eight) hours  as needed for nausea or vomiting.    . pantoprazole (PROTONIX) 40 MG tablet Take 1 tablet (40 mg total) by mouth daily. 30 tablet 1  . sacubitril-valsartan (ENTRESTO) 24-26 MG Take 1 tablet by mouth 2 (two) times daily. 180 tablet 3  . traZODone (DESYREL) 50 MG tablet Take 100 mg by mouth at bedtime as needed for sleep.    [name] warfarin (COUMADIN) 5 MG tablet TAKE 1 TABLET BY MOUTH ONCE DAILY AT 6 PM 100 tablet 1  . carvedilol (COREG) 6.25 MG tablet Take 2 tablets (12.5 mg) by mouth 2 (two) times daily. 360 tablet 3  . spironolactone (ALDACTONE) 25 MG tablet Take 1 tablet (25 mg total) by mouth daily. 30 tablet 6   No current facility-administered medications for this encounter.    Vitals:   10/26/18 1151  BP: (!) 148/76  Pulse: 75  SpO2: 94%  Weight: 120 kg (264 lb 9.6 oz)   Wt Readings from Last 3 Encounters:  10/26/18  120 kg (264 lb 9.6 oz)  08/06/18 114.5 kg (252 lb 6.4 oz)  07/27/18 116.6 kg (257 lb)   General: NAD Neck: No JVD, no thyromegaly or thyroid nodule.  Lungs: Clear to auscultation bilaterally with normal respiratory effort. CV: Nondisplaced PMI.  Heart regular S1/S2, no S3/S4, 2/6 early systolic murmur RUSB.  No peripheral edema.  No carotid bruit.  Normal pedal pulses.  Abdomen: Soft, nontender, no hepatosplenomegaly, no distention.  Skin: Intact without lesions or rashes.  Neurologic: Alert and oriented x 3.  Psych: Normal affect. Extremities: No clubbing or cyanosis.  HEENT: Normal.   Assessment/Plan: 1. Valvular heart disease: TEE in 9/18 showed severe MR, probably infarct-related with restrictive posterior mitral leaflet. There was moderate aortic stenosis and moderate-severe aortic insufficiency, possible rheumatic.  In 12/18, he had bioprosthetic MVR and AVR.  - Valves stable on 6/19 echo.  2. Chronic combined CHF: In setting of significant valvular disease as above. EF down to 30-35% post-op (1/19 echo), likely reflects true LV systolic function without the  volume load from severe MR and moderate-severe AI.  TEE in 5/19 showed EF 25-30%.  Repeat echo in 6/19 with EF 30-35%. He is not volume overloaded on exam.  NYHA class II symptoms.   - He is only very rarely RV-pacing now, needs repeat echo to see if EF is significantly improved with no RV pacing. If EF remains low, he will be an ICD candidate (not CRT candidate).  - Continue Lasix 40 mg daily.  BMET today.    - Increase Coreg to 9.375 mg bid x 1 week, if doing ok can then increase Coreg to 12.5 mg bid.  - Continue Entresto 24/26 bid. Will not increase at this time with elevated creatinine.  - Continue spironolactone 25 mg daily.    3. CKD: Stage 3. BMET today.  4. Atrial fibrillation/flutter: Paroxysmal. He has had a Maze procedure.  He tends to tolerate atrial arrhythmias poorly.  Most recently, he had atrial flutter with DCCV in 5/19.  He is now maintaining NSR off amiodarone.  - He is on warfarin.  5. COPD: Mild obstruction on PFTs.  He has quit smoking.  Stable. No change.   6. CVA: Remote, minimal residual. S/p carotid stenting.  - No ASA given warfarin use.  7. Hyperlipidemia: Good lipids 4/19.    8. Anemia: Fe deficiency.   9. 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  The artery to the PDA was occluded with left to right collaterals.  He had SVG-PDA in 12/18. No chest pain.  - Continue statin.  Good LDL 6/19.  - No ASA given warfarin use.  10. CHB: has Medtronic PPM.  Minimal RV pacing now.   Followup in 3 months with echo.   Loralie Champagne 10/28/2018

## 2018-11-02 ENCOUNTER — Encounter (HOSPITAL_COMMUNITY): Payer: Self-pay | Admitting: Emergency Medicine

## 2018-11-02 ENCOUNTER — Other Ambulatory Visit: Payer: Self-pay

## 2018-11-02 ENCOUNTER — Emergency Department (HOSPITAL_COMMUNITY): Payer: Medicare HMO

## 2018-11-02 ENCOUNTER — Emergency Department (HOSPITAL_COMMUNITY)
Admission: EM | Admit: 2018-11-02 | Discharge: 2018-11-02 | Disposition: A | Discharge status: Home / Self Care | Payer: Medicare HMO | Attending: Emergency Medicine | Admitting: Emergency Medicine

## 2018-11-02 DIAGNOSIS — F329 Major depressive disorder, single episode, unspecified: Secondary | ICD-10-CM | POA: Insufficient documentation

## 2018-11-02 DIAGNOSIS — F419 Anxiety disorder, unspecified: Secondary | ICD-10-CM | POA: Diagnosis not present

## 2018-11-02 DIAGNOSIS — I10 Essential (primary) hypertension: Secondary | ICD-10-CM | POA: Insufficient documentation

## 2018-11-02 DIAGNOSIS — Z952 Presence of prosthetic heart valve: Secondary | ICD-10-CM | POA: Diagnosis not present

## 2018-11-02 DIAGNOSIS — Z9049 Acquired absence of other specified parts of digestive tract: Secondary | ICD-10-CM | POA: Insufficient documentation

## 2018-11-02 DIAGNOSIS — I252 Old myocardial infarction: Secondary | ICD-10-CM | POA: Diagnosis not present

## 2018-11-02 DIAGNOSIS — Z87891 Personal history of nicotine dependence: Secondary | ICD-10-CM | POA: Insufficient documentation

## 2018-11-02 DIAGNOSIS — I2510 Atherosclerotic heart disease of native coronary artery without angina pectoris: Secondary | ICD-10-CM | POA: Diagnosis not present

## 2018-11-02 DIAGNOSIS — M5489 Other dorsalgia: Secondary | ICD-10-CM | POA: Diagnosis not present

## 2018-11-02 DIAGNOSIS — M545 Low back pain, unspecified: Secondary | ICD-10-CM

## 2018-11-02 DIAGNOSIS — Z8673 Personal history of transient ischemic attack (TIA), and cerebral infarction without residual deficits: Secondary | ICD-10-CM | POA: Diagnosis not present

## 2018-11-02 DIAGNOSIS — J449 Chronic obstructive pulmonary disease, unspecified: Secondary | ICD-10-CM | POA: Diagnosis not present

## 2018-11-02 DIAGNOSIS — Z951 Presence of aortocoronary bypass graft: Secondary | ICD-10-CM | POA: Diagnosis not present

## 2018-11-02 DIAGNOSIS — R52 Pain, unspecified: Secondary | ICD-10-CM | POA: Diagnosis not present

## 2018-11-02 MED ORDER — METHOCARBAMOL 500 MG PO TABS: 500.0000 mg | ORAL_TABLET | Freq: Three times a day (TID) | ORAL | 0 refills | Status: AC | PRN

## 2018-11-02 MED ORDER — HYDROCODONE-ACETAMINOPHEN 5-325 MG PO TABS: 1.0000 | ORAL_TABLET | Freq: Four times a day (QID) | ORAL | 0 refills | Status: DC | PRN

## 2018-11-02 MED ORDER — METHOCARBAMOL 500 MG PO TABS
1000.0000 mg | ORAL_TABLET | Freq: Once | ORAL | Status: AC
  Administered 2018-11-02: 1000 mg via ORAL
  Filled 2018-11-02: qty 2

## 2018-11-02 MED ORDER — HYDROCODONE-ACETAMINOPHEN 5-325 MG PO TABS
2.0000 | ORAL_TABLET | Freq: Once | ORAL | Status: AC
  Administered 2018-11-02: 2 via ORAL
  Filled 2018-11-02: qty 2

## 2018-11-02 MED FILL — ENTRESTO 24 MG-26 MG TABLET: 24-26 | 30 days supply | Qty: 60 | Fill #4

## 2018-11-02 NOTE — ED Provider Notes (Signed)
[facility] Emergency Department Provider Note MRN:  [id number]  Arrival date & time: 11/02/18     Chief Complaint   Back Pain   History of Present Illness   Brandon [name] is a 72 y.o. year-old male with a history of CAD, CHF, COPD, A. fib presenting to the ED with chief complaint of back pain.  Woke up yesterday morning with mild to moderate midline lumbar back pain.  Denies any new exertional activities the day before, no recent trauma.  Pain continued throughout the day yesterday.  Woke up this morning and pain was significantly worse, having great difficulty getting out of bed.  Denies numbness or weakness to the arms or legs.  Denies bowel or bladder dysfunction.  No recent fevers, no chest pain or shortness of breath.  Has had surgery on his lower back, but no hardware in place.  Takes warfarin daily.  Review of Systems  A complete 10 system review of systems was obtained and all systems are negative except as noted in the HPI and PMH.   Patient's Health History    Past Medical History:  Diagnosis Date  . Anemia 07/13/2017  . Anxiety   . Aortic insufficiency   . Aortic stenosis, moderate 07/13/2017  . Arthritis    back   . Carotid stenosis    Right carotid stent (widely patent) 40 - 59% left plaque 11/13  . COPD (chronic obstructive pulmonary disease) ([place])   . Coronary artery disease involving native coronary artery of native heart without angina pectoris   . Depression   . Dyslipidemia   . GERD (gastroesophageal reflux disease)   . Heart murmur   . Hemiplegia affecting unspecified side, late effect of cerebrovascular disease    resolved- from L side   . Hypertension   . Jaundice    resolved following ERCP & Cholecystectomy  . Mild emphysema ([place])   . Mitral regurgitation   . Mitral valve insufficiency and aortic valve insufficiency   . Myocardial infarction ([place])   . Paroxysmal atrial fibrillation ([facility])   . Pre-diabetes    per spouse  . S/P  aortic valve replacement with bioprosthetic valve 11/16/2017   23 mm [facility] Ease stented bovine pericardial tissue valve  . S/P CABG x 1 11/16/2017   SVG to PDA with EVH via right thigh  . S/P Maze operation for atrial fibrillation 11/16/2017   Left side lesion set using bipolar radiofrequency and cryothermy ablation with clipping of LA appendage  . S/P mitral valve replacement with bioprosthetic valve 11/16/2017   27 mm [facility] Mitral stented bovine pericardial tissue valve  . Sleep apnea    does not wear CPAP  . Sleep concern    resulted in surgery- after + sleep test. Pt. doesn't have a problem any longer.   . Stroke ([place]) 03/11/2003   stent placed on the 31, 3, 2004, L side   . Wears glasses   . Wears hearing aid in both ears   . Wears partial dentures     Past Surgical History:  Procedure Laterality Date  . AORTIC VALVE REPLACEMENT N/A 11/16/2017   Procedure: AORTIC VALVE REPLACEMENT (AVR) with 66mm Magna Ease;  Surgeon: Rexene Alberts, MD;  Location: [place];  Service: Open Heart Surgery;  Laterality: N/A;  . ARTERIAL LINE INSERTION Right 11/16/2017   Procedure: ARTERIAL LINE INSERTION -RIGHT FEMORAL;  Surgeon: Rexene Alberts, MD;  Location: [place];  Service: Open Heart Surgery;  Laterality: Right;  .  BACK SURGERY     lumbar back  . CARDIOVERSION N/A 04/11/2018   Procedure: CARDIOVERSION;  Surgeon: Larey Dresser, MD;  Location: [facility] ENDOSCOPY;  Service: Cardiovascular;  Laterality: N/A;  . CHOLECYSTECTOMY    . CORONARY ARTERY BYPASS GRAFT N/A 11/16/2017   Procedure: CORONARY ARTERY BYPASS GRAFTING (CABG) x 1;  Surgeon: Rexene Alberts, MD;  Location: [place];  Service: Open Heart Surgery;  Laterality: N/A;  . ENDOVEIN HARVEST OF GREATER SAPHENOUS VEIN Right 11/16/2017   Procedure: ENDOVEIN HARVEST OF GREATER SAPHENOUS VEIN;  Surgeon: Rexene Alberts, MD;  Location: [place];  Service: Open Heart Surgery;  Laterality: Right;  . ERCP N/A 05/31/2013   Procedure: ENDOSCOPIC  RETROGRADE CHOLANGIOPANCREATOGRAPHY (ERCP);  Surgeon: Ladene Artist, MD;  Location: [facility];  Service: Endoscopy;  Laterality: N/A;  . FOOT SURGERY     right  . IR RADIOLOGY PERIPHERAL GUIDED IV START  09/28/2017  . IR US GUIDE VASC ACCESS RIGHT  09/28/2017  . LAPAROSCOPIC CHOLECYSTECTOMY SINGLE PORT N/A 06/01/2013   Procedure: LAPAROSCOPIC CHOLECYSTECTOMY SINGLE PORT;  Surgeon: Adin Hector, MD;  Location: [facility];  Service: General;  Laterality: N/A;  . MAZE N/A 11/16/2017   Procedure: MAZE;  Surgeon: Rexene Alberts, MD;  Location: [place];  Service: Open Heart Surgery;  Laterality: N/A;  . MITRAL VALVE REPAIR N/A 11/16/2017   Procedure: MITRAL VALVE  REPLACEMENT with 27mm MagnaEase;  Surgeon: Rexene Alberts, MD;  Location: [place];  Service: Open Heart Surgery;  Laterality: N/A;  . PACEMAKER IMPLANT N/A 11/21/2017   Procedure: PACEMAKER IMPLANT;  Surgeon: Evans Lance, MD;  Location: [place] CV LAB;  Service: Cardiovascular;  Laterality: N/A;  . POLYPECTOMY    . RIGHT/LEFT HEART CATH AND CORONARY ANGIOGRAPHY N/A 08/30/2017   Procedure: RIGHT/LEFT HEART CATH AND CORONARY ANGIOGRAPHY;  Surgeon: Larey Dresser, MD;  Location: [place] CV LAB;  Service: Cardiovascular;  Laterality: N/A;  . SHOULDER ARTHROSCOPY WITH ROTATOR CUFF REPAIR AND SUBACROMIAL DECOMPRESSION Left 05/18/2017  . SHOULDER ARTHROSCOPY WITH ROTATOR CUFF REPAIR AND SUBACROMIAL DECOMPRESSION Left 05/18/2017   Procedure: SHOULDER ARTHROSCOPY WITH ROTATOR CUFF REPAIR AND SUBACROMIAL DECOMPRESSION;  Surgeon: Tania Ade, MD;  Location: [place];  Service: Orthopedics;  Laterality: Left;  LEFT SHOULDER ARTHROSCOPY WITH ROTATOR CUFF REPAIR AND SUBACROMIAL DECOMPRESSION  . TEE WITHOUT CARDIOVERSION N/A 05/09/2017   Procedure: TRANSESOPHAGEAL ECHOCARDIOGRAM (TEE);  Surgeon: Skeet Latch, MD;  Location: [place];  Service: Cardiovascular;  Laterality: N/A;  . TEE WITHOUT CARDIOVERSION N/A 08/30/2017   Procedure:  TRANSESOPHAGEAL ECHOCARDIOGRAM (TEE);  Surgeon: Larey Dresser, MD;  Location: [facility] ENDOSCOPY;  Service: Cardiovascular;  Laterality: N/A;  . TEE WITHOUT CARDIOVERSION N/A 11/16/2017   Procedure: TRANSESOPHAGEAL ECHOCARDIOGRAM (TEE);  Surgeon: Rexene Alberts, MD;  Location: [place];  Service: Open Heart Surgery;  Laterality: N/A;  . TEE WITHOUT CARDIOVERSION N/A 04/11/2018   Procedure: TRANSESOPHAGEAL ECHOCARDIOGRAM (TEE);  Surgeon: Larey Dresser, MD;  Location: [facility] ENDOSCOPY;  Service: Cardiovascular;  Laterality: N/A;  . TONSILLECTOMY      Family History  Problem Relation Age of Onset  . Stroke Father        No details  . Angina Mother     Social History   Socioeconomic History  . Marital status: Married    Spouse name: Not on file  . Number of children: 3  . Years of education: Not on file  . Highest education level: Not on file  Occupational History    Employer:  RETIRED  Social Needs  . Financial resource strain: Not on file  . Food insecurity:    Worry: Not on file    Inability: Not on file  . Transportation needs:    Medical: Not on file    Non-medical: Not on file  Tobacco Use  . Smoking status: Former Smoker    Packs/day: 0.50    Years: 57.00    Pack years: 28.50    Types: Cigarettes    Last attempt to quit: 07/13/1997    Years since quitting: 21.3  . Smokeless tobacco: Never Used  Substance and Sexual Activity  . Alcohol use: Yes    Alcohol/week: 2.0 - 3.0 standard drinks    Types: 2 - 3 Glasses of wine per week    Comment: moderate wine ; not since 8/ 2018  . Drug use: No  . Sexual activity: Yes  Lifestyle  . Physical activity:    Days per week: Not on file    Minutes per session: Not on file  . Stress: Not on file  Relationships  . Social connections:    Talks on phone: Not on file    Gets together: Not on file    Attends religious service: Not on file    Active member of club or organization: Not on file    Attends meetings of clubs or organizations:  Not on file    Relationship status: Not on file  . Intimate partner violence:    Fear of current or ex partner: Not on file    Emotionally abused: Not on file    Physically abused: Not on file    Forced sexual activity: Not on file  Other Topics Concern  . Not on file  Social History Narrative   Two living children.  Lives with wife.       Physical Exam  Vital Signs and Nursing Notes reviewed Vitals:   11/02/18 1412  BP: (!) 145/71  Pulse: 69  Resp: 14  Temp: 97.8 F (36.6 C)  SpO2: 95%    CONSTITUTIONAL: Well-appearing, NAD NEURO:  Alert and oriented x 3, no focal deficits EYES:  eyes equal and reactive ENT/NECK:  no LAD, no JVD CARDIO: Regular rate, well-perfused, normal S1 and S2 PULM:  CTAB no wheezing or rhonchi GI/GU:  normal bowel sounds, non-distended, non-tender MSK/SPINE:  No gross deformities, no edema; tenderness to palpation to the midline lumbar spine, negative straight leg test bilaterally SKIN:  no rash, atraumatic PSYCH:  Appropriate speech and behavior  Diagnostic and Interventional Summary    Labs Reviewed - No data to display  DG Lumbar Spine Complete  Final Result      Medications  methocarbamol (ROBAXIN) tablet 1,000 mg (1,000 mg Oral Given 11/02/18 1512)  HYDROcodone-acetaminophen (NORCO/VICODIN) 5-325 MG per tablet 2 tablet (2 tablets Oral Given 11/02/18 1605)     Procedures Critical Care  ED Course and Medical Decision Making  I have reviewed the triage vital signs and the nursing notes.  Pertinent labs & imaging results that were available during my care of the patient were reviewed by me and considered in my medical decision making (see below for details).  Favoring musculoskeletal strain or spasm in the 72 year old male with multiple comorbidities.  No neurological deficits, no bowel or bladder dysfunction, no fever, no IV drug use, nothing to suggest myelopathy at this time.  Given advanced age, multiple comorbidities, will screen  with x-ray to exclude neoplastic process or compression fracture.  X-ray unremarkable.  Patient  feeling much better after muscle relaxer.  Able to sit on the edge of the bed, close but not quite able to stand due to pain.  Patient lives at home with his wife, no one else to assist them.  Ideally would be able to stand prior to discharge.  Will attempt repeat dosage of pain medication and reassess ambulatory status.  Anticipating discharge.  Send it to Dr. Roderic Palau at shift change.  Barth Kirks. Sedonia Small, [place] [email or web address]  Final Clinical Impressions(s) / ED Diagnoses     ICD-10-CM   1. Acute midline low back pain without sciatica M54.5     ED Discharge Orders    None         Maudie Flakes, MD 11/02/18 1615

## 2018-11-02 NOTE — Discharge Instructions (Addendum)
You were evaluated in the Emergency Department and after careful evaluation, we did not find any emergent condition requiring admission or further testing in the hospital. Your symptoms today seem to be due to muscular skeletal back pain.  Please take the muscle relaxers as directed and follow-up with your regular doctor.  Please return to the Emergency Department if you experience any worsening of your condition.  We encourage you to follow up with a primary care provider.  Thank you for allowing [place] to be a part of your care.

## 2018-11-02 NOTE — ED Triage Notes (Signed)
Pt c/o lower back pain that has progressively worsened since yesterday morning. Denies injury. States when he woke up today he could barely move due to pain.

## 2018-11-02 NOTE — ED Notes (Signed)
Patient transported to X-ray 

## 2018-11-06 DIAGNOSIS — M62830 Muscle spasm of back: Secondary | ICD-10-CM | POA: Diagnosis not present

## 2018-11-19 ENCOUNTER — Ambulatory Visit (HOSPITAL_COMMUNITY): Payer: Medicare HMO | Attending: Cardiology

## 2018-11-19 ENCOUNTER — Other Ambulatory Visit: Payer: Self-pay

## 2018-11-19 DIAGNOSIS — I5022 Chronic systolic (congestive) heart failure: Secondary | ICD-10-CM

## 2018-11-21 ENCOUNTER — Ambulatory Visit (INDEPENDENT_AMBULATORY_CARE_PROVIDER_SITE_OTHER): Payer: Medicare HMO | Admitting: *Deleted

## 2018-11-21 DIAGNOSIS — Z5181 Encounter for therapeutic drug level monitoring: Secondary | ICD-10-CM

## 2018-11-21 DIAGNOSIS — Z9889 Other specified postprocedural states: Secondary | ICD-10-CM

## 2018-11-21 DIAGNOSIS — I480 Paroxysmal atrial fibrillation: Secondary | ICD-10-CM | POA: Diagnosis not present

## 2018-11-21 DIAGNOSIS — Z8679 Personal history of other diseases of the circulatory system: Secondary | ICD-10-CM | POA: Diagnosis not present

## 2018-11-21 DIAGNOSIS — Z951 Presence of aortocoronary bypass graft: Secondary | ICD-10-CM | POA: Diagnosis not present

## 2018-11-21 DIAGNOSIS — Z953 Presence of xenogenic heart valve: Secondary | ICD-10-CM | POA: Diagnosis not present

## 2018-11-21 LAB — POCT INR: INR: 1.9 — AB (ref 2.0–3.0)

## 2018-11-21 NOTE — Patient Instructions (Addendum)
Description   Today take 1.5 tablets then continue taking 1 tablet everyday.  Recheck INR in 6 weeks.  Call with any new medications scheduled procedures or any questions  [phone]

## 2018-11-26 ENCOUNTER — Encounter: Payer: Self-pay | Admitting: Thoracic Surgery (Cardiothoracic Vascular Surgery)

## 2018-11-26 ENCOUNTER — Other Ambulatory Visit: Payer: Self-pay

## 2018-11-26 ENCOUNTER — Ambulatory Visit: Payer: Medicare HMO | Admitting: Thoracic Surgery (Cardiothoracic Vascular Surgery)

## 2018-11-26 VITALS — BP 110/67 | HR 60 | Resp 16 | Ht 68.5 in | Wt 256.0 lb

## 2018-11-26 DIAGNOSIS — Z953 Presence of xenogenic heart valve: Secondary | ICD-10-CM | POA: Diagnosis not present

## 2018-11-26 DIAGNOSIS — Z8679 Personal history of other diseases of the circulatory system: Secondary | ICD-10-CM | POA: Diagnosis not present

## 2018-11-26 DIAGNOSIS — Z951 Presence of aortocoronary bypass graft: Secondary | ICD-10-CM | POA: Diagnosis not present

## 2018-11-26 DIAGNOSIS — Z9889 Other specified postprocedural states: Secondary | ICD-10-CM | POA: Diagnosis not present

## 2018-11-26 NOTE — Progress Notes (Signed)
[address]       [place] [address]             [phone]     CARDIOTHORACIC SURGERY OFFICE NOTE  Referring Provider is Larey Dresser, MD PCP is Maurice Small, MD   HPI:  Patient is a 72 year old moderately obese white male with history of cerebrovascular disease status post right hemispheric stroke in 2004, hypertension, aortic stenosis with aortic insufficiency, mitral regurgitation, and recurrent paroxysmal atrial fibrillation who returns to the office today for routine follow-up status post aortic and mitral valve replacement using bioprosthetic tissue valve, coronary artery bypass grafting x1, and Maze procedure on November 16, 2017.  Findings at the time of surgery were consistent with rheumatic aortic and mitral valve disease and moderate left ventricular chamber enlargement with significant atherosclerotic disease involving the thoracic aorta.  The patient's early postoperative convalescence was somewhat slow and he required permanent pacemaker placement for postop complete heart block. He was last seen here in our office on February 26, 2018 at which time he was doing well.  He did develop atypical atrial flutter for which he underwent DC cardioversion last May.  Since then he has been maintaining sinus rhythm.  He has been followed closely by Dr. Aundra Dubin, and last week he underwent routine follow-up echocardiogram on November 19, 2018 which revealed normal functioning bioprosthetic tissue valve in the aortic and mitral position.  Left ventricular systolic function appeared normal and was notably considerably improved in comparison with the last previous echocardiogram.  He returns to our office today and reports that he is doing very well.  He states that he is reasonably active physically and he reports no physical limitations.  He specifically denies symptoms of exertional shortness of breath or chest discomfort.  He has not had any palpitations.  Overall he feels  somewhat improved in comparison with how he felt prior to his surgery.  He has no complaints.   Current Outpatient Medications  Medication Sig Dispense Refill  . acetaminophen (TYLENOL) 325 MG tablet Take 1 tablet (325 mg total) by mouth every 6 (six) hours as needed for mild pain. 30 tablet 0  . calcium carbonate (TUMS - DOSED IN MG ELEMENTAL CALCIUM) 500 MG chewable tablet Chew 1 tablet by mouth daily as needed for indigestion or heartburn.    . carvedilol (COREG) 6.25 MG tablet Take 2 tablets (12.5 mg) by mouth 2 (two) times daily. 360 tablet 3  . cetirizine (ZYRTEC) 10 MG tablet Take 10 mg by mouth daily as needed for allergies.    . citalopram (CELEXA) 20 MG tablet Take 1 tablet (20 mg total) by mouth at bedtime. 30 tablet 5  . docusate sodium (COLACE) 100 MG capsule Take 1 capsule (100 mg total) by mouth 3 (three) times daily as needed. 20 capsule 0  . furosemide (LASIX) 40 MG tablet Take 1 tablet (40 mg total) by mouth daily. 30 tablet 3  . HYDROcodone-acetaminophen (NORCO/VICODIN) 5-325 MG tablet Take 1 tablet by mouth every 6 (six) hours as needed for moderate pain. 20 tablet 0  . ipratropium (ATROVENT) 0.03 % nasal spray Place 2 sprays into both nostrils 2 (two) times daily.     [name] lovastatin (MEVACOR) 40 MG tablet TAKE 1 TABLET AT BEDTIME  (NEW  DOSE) 90 tablet 3  . methocarbamol (ROBAXIN) 500 MG tablet Take 1 tablet (500 mg total) by mouth every 8 (eight) hours as needed for muscle spasms. 30 tablet 0  .  ondansetron (ZOFRAN) 4 MG tablet Take 4 mg by mouth every 8 (eight) hours as needed for nausea or vomiting.    . pantoprazole (PROTONIX) 40 MG tablet Take 1 tablet (40 mg total) by mouth daily. 30 tablet 1  . sacubitril-valsartan (ENTRESTO) 24-26 MG Take 1 tablet by mouth 2 (two) times daily. 180 tablet 3  . spironolactone (ALDACTONE) 25 MG tablet Take 1 tablet (25 mg total) by mouth daily. 30 tablet 6  . traZODone (DESYREL) 50 MG tablet Take 100 mg by mouth at bedtime as needed for  sleep.    [name] warfarin (COUMADIN) 5 MG tablet TAKE 1 TABLET BY MOUTH ONCE DAILY AT 6 PM 100 tablet 1   No current facility-administered medications for this visit.       Physical Exam:   BP 110/67 (BP Location: Right Arm, Patient Position: Sitting, Cuff Size: Large)   Pulse 60   Resp 16   Ht 5' 8.5" (1.74 m)   Wt 256 lb (116.1 kg)   SpO2 98% Comment: ON RA  BMI 38.36 kg/m   General:  Obese but well-appearing  Chest:   Clear to auscultation  CV:   Regular rate and rhythm without murmur  Incisions:  Completely healed  Abdomen:  Soft nontender  Extremities:  Warm and well perfused, no edema  Diagnostic Tests:  Transthoracic Echocardiography  Patient:    Brandon [name], Brandon [name] MR #:       [id number] Study Date: 11/19/2018 Gender:     M Age:        68 Height:     182.9 cm Weight:     113.4 kg BSA:        2.44 m^2 Pt. Status: Room:   ORDERING     Cristopher Peru, MD  REFERRING    Cristopher Peru, MD  ATTENDING    Candee Furbish, M.D.  SONOGRAPHER  Marygrace Drought, RCS  PERFORMING   [facility], Outpatient  cc:  ------------------------------------------------------------------- LV EF: 50% -   55%  ------------------------------------------------------------------- Indications:      CHF (I50.22).  ------------------------------------------------------------------- History:   PMH:  AVR, MVR (29mm Magna ease)  Atrial fibrillation. Atrial fibrillation.  ------------------------------------------------------------------- Study Conclusions  - Left ventricle: The cavity size was normal. Wall thickness was   increased in a pattern of moderate LVH. Systolic function was   normal. The estimated ejection fraction was in the range of 50%   to 55%. Septal hypokinesis. The study is not technically   sufficient to allow evaluation of LV diastolic function. - Aortic valve: s/p Bioprosthetic AVR - no obstruction. Mean   gradient (S): 10 mm Hg. Peak gradient (S): 21 mm Hg. Valve area    (VTI): 1.59 cm^2. Valve area (Vmax): 1.42 cm^2. Valve area   (Vmean): 1.52 cm^2. - Mitral valve: S/p bioprosthetic MVR - no obstruction. Valve area   by pressure half-time: 2.12 cm^2. Valve area by continuity   equation (using LVOT flow): 1.76 cm^2. - Left atrium: The atrium was at the upper limits of normal in   size. - Right ventricle: Pacer wire or catheter noted in right ventricle. - Right atrium: The atrium was mildly dilated. Pacer wire or   catheter noted in right atrium. - Atrial septum: No defect or patent foramen ovale was identified. - Tricuspid valve: There was trivial regurgitation. - Pulmonary arteries: PA peak pressure: 18 mm Hg (S). - Inferior vena cava: The vessel was normal in size. The   respirophasic diameter changes were in the normal range (=  50%),   consistent with normal central venous pressure.  Impressions:  - Compared to a prior study in 05/2018, the LVEF has improved to   50-55% with septal hypokinesis. The bioprosthetic AVR and MVR   gradients are stable.  ------------------------------------------------------------------- Labs, prior tests, procedures, and surgery: Permanent pacemaker system implantation.  Coronary artery bypass grafting.  ------------------------------------------------------------------- Study data:  Comparison was made to the study of 05/25/2018.  Study status:  Routine.  Procedure:  The patient reported no pain pre or post test. Transthoracic echocardiography. Image quality was adequate.          Transthoracic echocardiography.  M-mode, complete 2D, spectral Doppler, and color Doppler.  Birthdate: Patient birthdate: 10/09/1946.  Age:  Patient is 72 yr old.  Sex: Gender: male.    BMI: 33.9 kg/m^2.  Blood pressure:     154/86 Patient status:  Outpatient.  Study date:  Study date: 11/19/2018. Study time: 11:49 AM.  Location:  [facility]  3  -------------------------------------------------------------------  ------------------------------------------------------------------- Left ventricle:  The cavity size was normal. Wall thickness was increased in a pattern of moderate LVH. Systolic function was normal. The estimated ejection fraction was in the range of 50% to 55%. The study is not technically sufficient to allow evaluation of LV diastolic function.  ------------------------------------------------------------------- Aortic valve:  s/p Bioprosthetic AVR - no obstruction.  Doppler:  VTI ratio of LVOT to aortic valve: 0.51. Valve area (VTI): 1.59 cm^2. Indexed valve area (VTI): 0.65 cm^2/m^2. Peak velocity ratio of LVOT to aortic valve: 0.45. Valve area (Vmax): 1.42 cm^2. Indexed valve area (Vmax): 0.58 cm^2/m^2. Mean velocity ratio of LVOT to aortic valve: 0.49. Valve area (Vmean): 1.52 cm^2. Indexed valve area (Vmean): 0.63 cm^2/m^2.    Mean gradient (S): 10 mm Hg. Peak gradient (S): 21 mm Hg.  ------------------------------------------------------------------- Aorta:  Aortic root: The aortic root was normal in size. Ascending aorta: The ascending aorta was normal in size.  ------------------------------------------------------------------- Mitral valve:  S/p bioprosthetic MVR - no obstruction.  Doppler:  Valve area by pressure half-time: 2.12 cm^2. Indexed valve area by pressure half-time: 0.87 cm^2/m^2. Valve area by continuity equation (using LVOT flow): 1.76 cm^2. Indexed valve area by continuity equation (using LVOT flow): 0.72 cm^2/m^2.    Mean gradient (D): 2 mm Hg. Peak gradient (D): 7 mm Hg.  ------------------------------------------------------------------- Left atrium:  The atrium was at the upper limits of normal in size.   ------------------------------------------------------------------- Atrial septum:  No defect or patent foramen ovale was identified.    ------------------------------------------------------------------- Right ventricle:  The cavity size was normal. Wall thickness was normal. Pacer wire or catheter noted in right ventricle. Systolic function was normal.  ------------------------------------------------------------------- Pulmonic valve:    The valve appears to be grossly normal. Doppler:  There was no significant regurgitation.  ------------------------------------------------------------------- Tricuspid valve:   Doppler:  There was trivial regurgitation.  ------------------------------------------------------------------- Pulmonary artery:   The main pulmonary artery was normal-sized.  ------------------------------------------------------------------- Right atrium:  The atrium was mildly dilated. Pacer wire or catheter noted in right atrium.  ------------------------------------------------------------------- Pericardium:  There was no pericardial effusion.  ------------------------------------------------------------------- Systemic veins: Inferior vena cava: The vessel was normal in size. The respirophasic diameter changes were in the normal range (= 50%), consistent with normal central venous pressure. Diameter: 17 mm.  ------------------------------------------------------------------- Measurements   IVC  Value          Reference  ID                                       17    mm       ----------    Left ventricle                           Value          Reference  LV ID, ED, PLAX chordal          (H)     52.8  mm       43 - 52  LV ID, ES, PLAX chordal          (H)     38.2  mm       23 - 38  LV fx shortening, PLAX chordal   (L)     28    %        >=29  LV PW thickness, ED                      12.2  mm       ----------  IVS/LV PW ratio, ED                      1.06           <=1.3  Stroke volume, 2D                        73    ml       ----------  Stroke  volume/bsa, 2D                    30    ml/m^2   ----------  LV e&', lateral                           12.5  cm/s     ----------  LV E/e&', lateral                         10.24          ----------  LV e&', medial                            4.57  cm/s     ----------  LV E/e&', medial                          28.01          ----------  LV e&', average                           8.54  cm/s     ----------  LV E/e&', average                         15             ----------    Ventricular septum  Value          Reference  IVS thickness, ED                        12.9  mm       ----------    LVOT                                     Value          Reference  LVOT ID, S                               20    mm       ----------  LVOT area                                3.14  cm^2     ----------  LVOT peak velocity, S                    103   cm/s     ----------  LVOT mean velocity, S                    68.9  cm/s     ----------  LVOT VTI, S                              23.3  cm       ----------    Aortic valve                             Value          Reference  Aortic valve peak velocity, S            227   cm/s     ----------  Aortic valve mean velocity, S            142   cm/s     ----------  Aortic valve VTI, S                      46.1  cm       ----------  Aortic mean gradient, S                  10    mm Hg    ----------  Aortic peak gradient, S                  21    mm Hg    ----------  VTI ratio, LVOT/AV                       0.51           ----------  Aortic valve area, VTI                   1.59  cm^2     ----------  Aortic valve area/bsa, VTI               0.65  cm^2/m^2 ----------  Velocity ratio, peak, LVOT/AV            0.45           ----------  Aortic valve area, peak velocity         1.42  cm^2     ----------  Aortic valve area/bsa, peak              0.58  cm^2/m^2 ----------  velocity  Velocity ratio, mean, LVOT/AV            0.49           ----------   Aortic valve area, mean velocity         1.52  cm^2     ----------  Aortic valve area/bsa, mean              0.63  cm^2/m^2 ----------  velocity    Aorta                                    Value          Reference  Aortic root ID, ED                       28    mm       ----------    Left atrium                              Value          Reference  LA ID, A-P, ES                           47    mm       ----------  LA ID/bsa, A-P                           1.93  cm/m^2   <=2.2  LA volume, S                             73.1  ml       ----------  LA volume/bsa, S                         30    ml/m^2   ----------  LA volume, ES, 1-p A4C                   79.3  ml       ----------  LA volume/bsa, ES, 1-p A4C               32.6  ml/m^2   ----------  LA volume, ES, 1-p A2C                   67.1  ml       ----------  LA volume/bsa, ES, 1-p A2C               27.5  ml/m^2   ----------    Mitral valve                             Value          Reference  Mitral E-wave peak velocity              128   cm/s     ----------  Mitral A-wave peak velocity              73.2  cm/s     ----------  Mitral mean velocity, D                  70.4  cm/s     ----------  Mitral deceleration time         (H)     345   ms       150 - 230  Mitral pressure half-time                102   ms       ----------  Mitral mean gradient, D                  2     mm Hg    ----------  Mitral peak gradient, D                  7     mm Hg    ----------  Mitral E/A ratio, peak                   1.7            ----------  Mitral valve area, PHT, DP               2.12  cm^2     ----------  Mitral valve area/bsa, PHT, DP           0.87  cm^2/m^2 ----------  Mitral valve area, LVOT                  1.76  cm^2     ----------  continuity  Mitral valve area/bsa, LVOT              0.72  cm^2/m^2 ----------  continuity  Mitral annulus VTI, D                    41.5  cm       ----------    Pulmonary arteries                       Value           Reference  PA pressure, S, DP                       18    mm Hg    <=30    Tricuspid valve                          Value          Reference  Tricuspid regurg peak velocity           196   cm/s     ----------  Tricuspid peak RV-RA gradient            15    mm Hg    ----------  Tricuspid maximal regurg                 196   cm/s     ----------  velocity, PISA    Right atrium                             Value          Reference  RA  ID, S-I, ES, A4C              (H)     59.1  mm       34 - 49  RA area, ES, A4C                         19    cm^2     8.3 - 19.5  RA volume, ES, A/L                       51.4  ml       ----------  RA volume/bsa, ES, A/L                   21.1  ml/m^2   ----------    Systemic veins                           Value          Reference  Estimated CVP                            3     mm Hg    ----------    Right ventricle                          Value          Reference  TAPSE                                    12.9  mm       ----------  RV pressure, S, DP                       18    mm Hg    <=30  RV s&', lateral, S                        9.46  cm/s     ----------  Legend: (L)  and  (H)  mark values outside specified reference range.  ------------------------------------------------------------------- Prepared and Electronically Authenticated by  Lyman Bishop MD 2019-12-10T11:06:34  Impression:  Patient is doing very well approximately 1 year status post aortic and mitral valve replacement using bioprosthetic tissue valve and maze procedure.  He is maintaining sinus rhythm and reports stable symptoms of exertional shortness of breath only with strenuous activity.  Plan:  We have not recommended any change to the patient's current medications.  In the future he will call and return to see [place] as needed.  The patient has been reminded regarding the importance of dental hygiene and the lifelong need for antibiotic prophylaxis for all dental  cleanings and other related invasive procedures.   I spent in excess of 15 minutes during the conduct of this office consultation and >50% of this time involved direct face-to-face encounter with the patient for counseling and/or coordination of their care.    Valentina Gu. Roxy Manns, MD 11/26/2018 12:24 PM

## 2018-11-26 NOTE — Patient Instructions (Addendum)
Continue all previous medications without any changes at this time  You may resume unrestricted physical activity without any particular limitations at this time.  Check your weight on a regular basis and keep a log for your records.  Look for signs of fluid overload such as worsening swelling of your lower legs, increased shortness of breath with activity, and/or a dry nonproductive cough.  Discussed these findings with your cardiologist including whether or not you should adjust your fluid pill dosage (diuretic).  Endocarditis is a potentially serious infection of heart valves or inside lining of the heart.  It occurs more commonly in patients with diseased heart valves (such as patient's with aortic or mitral valve disease) and in patients who have undergone heart valve repair or replacement.  Certain surgical and dental procedures may put you at risk, such as dental cleaning, other dental procedures, or any surgery involving the respiratory, urinary, gastrointestinal tract, gallbladder or prostate gland.   To minimize your chances for develooping endocarditis, maintain good oral health and seek prompt medical attention for any infections involving the mouth, teeth, gums, skin or urinary tract.    Always notify your doctor or dentist about your underlying heart valve condition before having any invasive procedures. You will need to take antibiotics before certain procedures, including all routine dental cleanings or other dental procedures.  Your cardiologist or dentist should prescribe these antibiotics for you to be taken ahead of time.      

## 2018-11-27 ENCOUNTER — Ambulatory Visit (INDEPENDENT_AMBULATORY_CARE_PROVIDER_SITE_OTHER): Payer: Medicare HMO

## 2018-11-27 DIAGNOSIS — I442 Atrioventricular block, complete: Secondary | ICD-10-CM

## 2018-11-27 NOTE — Progress Notes (Signed)
Remote pacemaker transmission.   

## 2018-11-28 ENCOUNTER — Encounter: Payer: Self-pay | Admitting: Cardiology

## 2018-11-28 ENCOUNTER — Ambulatory Visit: Payer: Medicare HMO | Admitting: Internal Medicine

## 2018-11-28 ENCOUNTER — Encounter: Payer: Self-pay | Admitting: Internal Medicine

## 2018-11-28 VITALS — BP 124/70 | HR 65 | Ht 68.5 in | Wt 267.0 lb

## 2018-11-28 DIAGNOSIS — I5022 Chronic systolic (congestive) heart failure: Secondary | ICD-10-CM

## 2018-11-28 DIAGNOSIS — I442 Atrioventricular block, complete: Secondary | ICD-10-CM

## 2018-11-28 NOTE — Patient Instructions (Signed)
Medication Instructions:  Your physician recommends that you continue on your current medications as directed. Please refer to the Current Medication list given to you today.  Labwork: None ordered.  Testing/Procedures: None ordered.  Follow-Up: Your physician wants you to follow-up in: one year with Dr. Lovena Le.   You will receive a reminder letter in the mail two months in advance. If you don't receive a letter, please call our office to schedule the follow-up appointment.  Remote monitoring is used to monitor your Pacemaker from home. This monitoring reduces the number of office visits required to check your device to one time per year. It allows [place] to keep an eye on the functioning of your device to ensure it is working properly. You are scheduled for a device check from home on 02/26/2019. You may send your transmission at any time that day. If you have a wireless device, the transmission will be sent automatically. After your physician reviews your transmission, you will receive a postcard with your next transmission date.  Any Other Special Instructions Will Be Listed Below (If Applicable).  If you need a refill on your cardiac medications before your next appointment, please call your pharmacy.

## 2018-11-28 NOTE — Progress Notes (Signed)
HPI [name] returns today for followup. He is a pleasant 72 yo man with aortic and mitral valve disease, s/p surgery with heart block s/p PPM insertion. His conduction improved and when I saw him last we reprogrammed his device to avoid ventricular pacing. He has undergone repeat echo and his EF has improved from the 30 to 50% ranges. He feels well. No syncope or palpitations.  Allergies  Allergen Reactions  . Bee Venom Anaphylaxis and Swelling     Current Outpatient Medications  Medication Sig Dispense Refill  . acetaminophen (TYLENOL) 325 MG tablet Take 1 tablet (325 mg total) by mouth every 6 (six) hours as needed for mild pain. 30 tablet 0  . calcium carbonate (TUMS - DOSED IN MG ELEMENTAL CALCIUM) 500 MG chewable tablet Chew 1 tablet by mouth daily as needed for indigestion or heartburn.    . carvedilol (COREG) 6.25 MG tablet Take 2 tablets (12.5 mg) by mouth 2 (two) times daily. 360 tablet 3  . cetirizine (ZYRTEC) 10 MG tablet Take 10 mg by mouth daily as needed for allergies.    . citalopram (CELEXA) 20 MG tablet Take 1 tablet (20 mg total) by mouth at bedtime. 30 tablet 5  . docusate sodium (COLACE) 100 MG capsule Take 1 capsule (100 mg total) by mouth 3 (three) times daily as needed. 20 capsule 0  . furosemide (LASIX) 40 MG tablet Take 1 tablet (40 mg total) by mouth daily. 30 tablet 3  . HYDROcodone-acetaminophen (NORCO/VICODIN) 5-325 MG tablet Take 1 tablet by mouth every 6 (six) hours as needed for moderate pain. 20 tablet 0  . ipratropium (ATROVENT) 0.03 % nasal spray Place 2 sprays into both nostrils 2 (two) times daily.     [name] lovastatin (MEVACOR) 40 MG tablet TAKE 1 TABLET AT BEDTIME  (NEW  DOSE) 90 tablet 3  . methocarbamol (ROBAXIN) 500 MG tablet Take 1 tablet (500 mg total) by mouth every 8 (eight) hours as needed for muscle spasms. 30 tablet 0  . ondansetron (ZOFRAN) 4 MG tablet Take 4 mg by mouth every 8 (eight) hours as needed for nausea or vomiting.    .  pantoprazole (PROTONIX) 40 MG tablet Take 1 tablet (40 mg total) by mouth daily. 30 tablet 1  . sacubitril-valsartan (ENTRESTO) 24-26 MG Take 1 tablet by mouth 2 (two) times daily. 180 tablet 3  . spironolactone (ALDACTONE) 25 MG tablet Take 1 tablet (25 mg total) by mouth daily. 30 tablet 6  . traZODone (DESYREL) 50 MG tablet Take 100 mg by mouth at bedtime as needed for sleep.    [name] warfarin (COUMADIN) 5 MG tablet TAKE 1 TABLET BY MOUTH ONCE DAILY AT 6 PM 100 tablet 1   No current facility-administered medications for this visit.      Past Medical History:  Diagnosis Date  . Anemia 07/13/2017  . Anxiety   . Aortic insufficiency   . Aortic stenosis, moderate 07/13/2017  . Arthritis    back   . Carotid stenosis    Right carotid stent (widely patent) 40 - 59% left plaque 11/13  . COPD (chronic obstructive pulmonary disease) ([place])   . Coronary artery disease involving native coronary artery of native heart without angina pectoris   . Depression   . Dyslipidemia   . GERD (gastroesophageal reflux disease)   . Heart murmur   . Hemiplegia affecting unspecified side, late effect of cerebrovascular disease    resolved- from L side   . Hypertension   .  Jaundice    resolved following ERCP & Cholecystectomy  . Mild emphysema ([place])   . Mitral regurgitation   . Mitral valve insufficiency and aortic valve insufficiency   . Myocardial infarction ([place])   . Paroxysmal atrial fibrillation ([facility])   . Pre-diabetes    per spouse  . S/P aortic valve replacement with bioprosthetic valve 11/16/2017   23 mm [facility] Ease stented bovine pericardial tissue valve  . S/P CABG x 1 11/16/2017   SVG to PDA with EVH via right thigh  . S/P Maze operation for atrial fibrillation 11/16/2017   Left side lesion set using bipolar radiofrequency and cryothermy ablation with clipping of LA appendage  . S/P mitral valve replacement with bioprosthetic valve 11/16/2017   27 mm [facility] Mitral stented bovine  pericardial tissue valve  . Sleep apnea    does not wear CPAP  . Sleep concern    resulted in surgery- after + sleep test. Pt. doesn't have a problem any longer.   . Stroke ([place]) 03/11/2003   stent placed on the 31, 3, 2004, L side   . Wears glasses   . Wears hearing aid in both ears   . Wears partial dentures     ROS:   All systems reviewed and negative except as noted in the HPI.   Past Surgical History:  Procedure Laterality Date  . AORTIC VALVE REPLACEMENT N/A 11/16/2017   Procedure: AORTIC VALVE REPLACEMENT (AVR) with 85mm Magna Ease;  Surgeon: Rexene Alberts, MD;  Location: [place];  Service: Open Heart Surgery;  Laterality: N/A;  . ARTERIAL LINE INSERTION Right 11/16/2017   Procedure: ARTERIAL LINE INSERTION -RIGHT FEMORAL;  Surgeon: Rexene Alberts, MD;  Location: [place];  Service: Open Heart Surgery;  Laterality: Right;  . BACK SURGERY     lumbar back  . CARDIOVERSION N/A 04/11/2018   Procedure: CARDIOVERSION;  Surgeon: Larey Dresser, MD;  Location: [facility] ENDOSCOPY;  Service: Cardiovascular;  Laterality: N/A;  . CHOLECYSTECTOMY    . CORONARY ARTERY BYPASS GRAFT N/A 11/16/2017   Procedure: CORONARY ARTERY BYPASS GRAFTING (CABG) x 1;  Surgeon: Rexene Alberts, MD;  Location: [place];  Service: Open Heart Surgery;  Laterality: N/A;  . ENDOVEIN HARVEST OF GREATER SAPHENOUS VEIN Right 11/16/2017   Procedure: ENDOVEIN HARVEST OF GREATER SAPHENOUS VEIN;  Surgeon: Rexene Alberts, MD;  Location: [place];  Service: Open Heart Surgery;  Laterality: Right;  . ERCP N/A 05/31/2013   Procedure: ENDOSCOPIC RETROGRADE CHOLANGIOPANCREATOGRAPHY (ERCP);  Surgeon: Ladene Artist, MD;  Location: [facility];  Service: Endoscopy;  Laterality: N/A;  . FOOT SURGERY     right  . IR RADIOLOGY PERIPHERAL GUIDED IV START  09/28/2017  . IR US GUIDE VASC ACCESS RIGHT  09/28/2017  . LAPAROSCOPIC CHOLECYSTECTOMY SINGLE PORT N/A 06/01/2013   Procedure: LAPAROSCOPIC CHOLECYSTECTOMY SINGLE PORT;  Surgeon:  Adin Hector, MD;  Location: [facility];  Service: General;  Laterality: N/A;  . MAZE N/A 11/16/2017   Procedure: MAZE;  Surgeon: Rexene Alberts, MD;  Location: [place];  Service: Open Heart Surgery;  Laterality: N/A;  . MITRAL VALVE REPAIR N/A 11/16/2017   Procedure: MITRAL VALVE  REPLACEMENT with 45mm MagnaEase;  Surgeon: Rexene Alberts, MD;  Location: [place];  Service: Open Heart Surgery;  Laterality: N/A;  . PACEMAKER IMPLANT N/A 11/21/2017   Procedure: PACEMAKER IMPLANT;  Surgeon: Evans Lance, MD;  Location: [place] CV [facility];  Service: Cardiovascular;  Laterality: N/A;  . POLYPECTOMY    .  RIGHT/LEFT HEART CATH AND CORONARY ANGIOGRAPHY N/A 08/30/2017   Procedure: RIGHT/LEFT HEART CATH AND CORONARY ANGIOGRAPHY;  Surgeon: Larey Dresser, MD;  Location: [place] [facility];  Service: Cardiovascular;  Laterality: N/A;  . SHOULDER ARTHROSCOPY WITH ROTATOR CUFF REPAIR AND SUBACROMIAL DECOMPRESSION Left 05/18/2017  . SHOULDER ARTHROSCOPY WITH ROTATOR CUFF REPAIR AND SUBACROMIAL DECOMPRESSION Left 05/18/2017   Procedure: SHOULDER ARTHROSCOPY WITH ROTATOR CUFF REPAIR AND SUBACROMIAL DECOMPRESSION;  Surgeon: Tania Ade, MD;  Location: [place];  Service: Orthopedics;  Laterality: Left;  LEFT SHOULDER ARTHROSCOPY WITH ROTATOR CUFF REPAIR AND SUBACROMIAL DECOMPRESSION  . TEE WITHOUT CARDIOVERSION N/A 05/09/2017   Procedure: TRANSESOPHAGEAL ECHOCARDIOGRAM (TEE);  Surgeon: Skeet Latch, MD;  Location: [place];  Service: Cardiovascular;  Laterality: N/A;  . TEE WITHOUT CARDIOVERSION N/A 08/30/2017   Procedure: TRANSESOPHAGEAL ECHOCARDIOGRAM (TEE);  Surgeon: Larey Dresser, MD;  Location: [facility] ENDOSCOPY;  Service: Cardiovascular;  Laterality: N/A;  . TEE WITHOUT CARDIOVERSION N/A 11/16/2017   Procedure: TRANSESOPHAGEAL ECHOCARDIOGRAM (TEE);  Surgeon: Rexene Alberts, MD;  Location: [place];  Service: Open Heart Surgery;  Laterality: N/A;  . TEE WITHOUT CARDIOVERSION N/A 04/11/2018   Procedure:  TRANSESOPHAGEAL ECHOCARDIOGRAM (TEE);  Surgeon: Larey Dresser, MD;  Location: [facility] ENDOSCOPY;  Service: Cardiovascular;  Laterality: N/A;  . TONSILLECTOMY       Family History  Problem Relation Age of Onset  . Stroke Father        No details  . Angina Mother      Social History   Socioeconomic History  . Marital status: Married    Spouse name: Not on file  . Number of children: 3  . Years of education: Not on file  . Highest education level: Not on file  Occupational History    Employer: RETIRED  Social Needs  . Financial resource strain: Not on file  . Food insecurity:    Worry: Not on file    Inability: Not on file  . Transportation needs:    Medical: Not on file    Non-medical: Not on file  Tobacco Use  . Smoking status: Former Smoker    Packs/day: 0.50    Years: 57.00    Pack years: 28.50    Types: Cigarettes    Last attempt to quit: 07/13/1997    Years since quitting: 21.3  . Smokeless tobacco: Never Used  Substance and Sexual Activity  . Alcohol use: Yes    Alcohol/week: 2.0 - 3.0 standard drinks    Types: 2 - 3 Glasses of wine per week    Comment: moderate wine ; not since 8/ 2018  . Drug use: No  . Sexual activity: Yes  Lifestyle  . Physical activity:    Days per week: Not on file    Minutes per session: Not on file  . Stress: Not on file  Relationships  . Social connections:    Talks on phone: Not on file    Gets together: Not on file    Attends religious service: Not on file    Active member of club or organization: Not on file    Attends meetings of clubs or organizations: Not on file    Relationship status: Not on file  . Intimate partner violence:    Fear of current or ex partner: Not on file    Emotionally abused: Not on file    Physically abused: Not on file    Forced sexual activity: Not on file  Other Topics Concern  . Not on file  Social History Narrative   Two living children.  Lives with wife.       BP 124/70   Pulse 65   Ht  5' 8.5" (1.74 m)   Wt 267 lb (121.1 kg)   SpO2 96%   BMI 40.01 kg/m   Physical Exam:  Well appearing NAD HEENT: Unremarkable Neck:  No JVD, no thyromegally Lymphatics:  No adenopathy Back:  No CVA tenderness Lungs:  Clear with no wheezes HEART:  Regular rate rhythm, no murmurs, no rubs, no clicks Abd:  soft, positive bowel sounds, no organomegally, no rebound, no guarding Ext:  2 plus pulses, no edema, no cyanosis, no clubbing Skin:  No rashes no nodules Neuro:  CN II through XII intact, motor grossly intact   DEVICE  Normal device function.  See PaceArt for details.   Assess/Plan: 1. CHB - resolved and while he is atrial pacing, not ventricularly pacing. 2. Chronic systolic heart failure- his EF has normalized after his device was reprogrammed to avoid RV pacing. No indication for ICD insertion at this point. No indication for an LV lead.  3. PPM - his medtronic device is paqing 47% in the atrium and less than 1% in the ventricle. 4. Dyslipidemia - he will continue lovastatin.   Mikle Bosworth.D.

## 2018-11-29 MED FILL — ENTRESTO 24 MG-26 MG TABLET: 24-26 | 30 days supply | Qty: 60 | Fill #5

## 2018-11-30 ENCOUNTER — Other Ambulatory Visit: Payer: Self-pay | Admitting: Internal Medicine

## 2018-11-30 LAB — CUP PACEART INCLINIC DEVICE CHECK
Implantable Lead Implant Date: 20181211
Implantable Lead Location: 753859
Implantable Lead Location: 753860
MDC IDC LEAD IMPLANT DT: 20181211
MDC IDC PG IMPLANT DT: 20181211
MDC IDC SESS DTM: 20191220113239

## 2018-12-13 ENCOUNTER — Other Ambulatory Visit (HOSPITAL_COMMUNITY): Payer: Self-pay | Admitting: Cardiology

## 2018-12-13 ENCOUNTER — Other Ambulatory Visit: Payer: Self-pay | Admitting: Internal Medicine

## 2018-12-21 DIAGNOSIS — H04123 Dry eye syndrome of bilateral lacrimal glands: Secondary | ICD-10-CM | POA: Diagnosis not present

## 2018-12-21 DIAGNOSIS — H40033 Anatomical narrow angle, bilateral: Secondary | ICD-10-CM | POA: Diagnosis not present

## 2018-12-30 LAB — CUP PACEART REMOTE DEVICE CHECK
Battery Remaining Longevity: 132 mo
Battery Voltage: 3.04 V
Brady Statistic AS VS Percent: 64.51 %
Brady Statistic RA Percent Paced: 36.18 %
Brady Statistic RV Percent Paced: 0.15 %
Date Time Interrogation Session: 20191217061023
Implantable Lead Implant Date: 20181211
Implantable Lead Implant Date: 20181211
Implantable Lead Location: 753859
Implantable Lead Model: 3830
Lead Channel Impedance Value: 418 Ohm
Lead Channel Pacing Threshold Amplitude: 0.625 V
Lead Channel Pacing Threshold Amplitude: 1.25 V
Lead Channel Pacing Threshold Pulse Width: 0.4 ms
Lead Channel Sensing Intrinsic Amplitude: 5.75 mV
Lead Channel Setting Sensing Sensitivity: 1.2 mV
MDC IDC LEAD LOCATION: 753860
MDC IDC MSMT LEADCHNL RA IMPEDANCE VALUE: 323 Ohm
MDC IDC MSMT LEADCHNL RA IMPEDANCE VALUE: 380 Ohm
MDC IDC MSMT LEADCHNL RA SENSING INTR AMPL: 5.75 mV
MDC IDC MSMT LEADCHNL RV IMPEDANCE VALUE: 266 Ohm
MDC IDC MSMT LEADCHNL RV PACING THRESHOLD PULSEWIDTH: 0.4 ms
MDC IDC MSMT LEADCHNL RV SENSING INTR AMPL: 4.25 mV
MDC IDC MSMT LEADCHNL RV SENSING INTR AMPL: 4.25 mV
MDC IDC PG IMPLANT DT: 20181211
MDC IDC SET LEADCHNL RA PACING AMPLITUDE: 2 V
MDC IDC SET LEADCHNL RV PACING AMPLITUDE: 2.5 V
MDC IDC SET LEADCHNL RV PACING PULSEWIDTH: 1 ms
MDC IDC STAT BRADY AP VP PERCENT: 0.06 %
MDC IDC STAT BRADY AP VS PERCENT: 35.34 %
MDC IDC STAT BRADY AS VP PERCENT: 0.1 %

## 2019-01-01 ENCOUNTER — Other Ambulatory Visit (HOSPITAL_COMMUNITY): Payer: Self-pay | Admitting: Cardiology

## 2019-01-01 MED FILL — ENTRESTO 24 MG-26 MG TABLET: 24-26 | 30 days supply | Qty: 60 | Fill #0

## 2019-01-02 ENCOUNTER — Ambulatory Visit (INDEPENDENT_AMBULATORY_CARE_PROVIDER_SITE_OTHER): Payer: Medicare HMO | Admitting: Pharmacist

## 2019-01-02 DIAGNOSIS — Z8679 Personal history of other diseases of the circulatory system: Secondary | ICD-10-CM | POA: Diagnosis not present

## 2019-01-02 DIAGNOSIS — Z951 Presence of aortocoronary bypass graft: Secondary | ICD-10-CM

## 2019-01-02 DIAGNOSIS — Z953 Presence of xenogenic heart valve: Secondary | ICD-10-CM

## 2019-01-02 DIAGNOSIS — Z9889 Other specified postprocedural states: Secondary | ICD-10-CM | POA: Diagnosis not present

## 2019-01-02 DIAGNOSIS — I480 Paroxysmal atrial fibrillation: Secondary | ICD-10-CM | POA: Diagnosis not present

## 2019-01-02 DIAGNOSIS — Z5181 Encounter for therapeutic drug level monitoring: Secondary | ICD-10-CM

## 2019-01-02 LAB — POCT INR: INR: 2.2 (ref 2.0–3.0)

## 2019-01-02 NOTE — Patient Instructions (Signed)
Continue taking 1 tablet everyday.  Recheck INR in 6 weeks.  Call with any new medications scheduled procedures or any questions  [phone]

## 2019-01-03 ENCOUNTER — Telehealth: Payer: Self-pay | Admitting: *Deleted

## 2019-01-03 DIAGNOSIS — H2511 Age-related nuclear cataract, right eye: Secondary | ICD-10-CM | POA: Diagnosis not present

## 2019-01-03 DIAGNOSIS — Z01818 Encounter for other preprocedural examination: Secondary | ICD-10-CM | POA: Diagnosis not present

## 2019-01-03 DIAGNOSIS — H2513 Age-related nuclear cataract, bilateral: Secondary | ICD-10-CM | POA: Diagnosis not present

## 2019-01-03 DIAGNOSIS — H04123 Dry eye syndrome of bilateral lacrimal glands: Secondary | ICD-10-CM | POA: Diagnosis not present

## 2019-01-03 DIAGNOSIS — H02831 Dermatochalasis of right upper eyelid: Secondary | ICD-10-CM | POA: Diagnosis not present

## 2019-01-03 DIAGNOSIS — H2512 Age-related nuclear cataract, left eye: Secondary | ICD-10-CM | POA: Diagnosis not present

## 2019-01-03 NOTE — Telephone Encounter (Signed)
   [facility] Pre-operative Risk Assessment    Request for surgical clearance:  1. What type of surgery is being performed? CATARACT EXTRACTION   2. When is this surgery scheduled? TBD   3. What type of clearance is required (medical clearance vs. Pharmacy clearance to hold med vs. Both)? MEDICAL  4. Are there any medications that need to be held prior to surgery and how long? WARFARIN: FYI: PER ASHLEY TIMMONS, NP NO NEED TO HOLD BLOOD THINNERS  5. Practice name and name of physician performing surgery? [facility] ; DR. Mitzi Hansen [place]  6. What is your office phone number [phone]    7.   What is your office fax number [phone]  8.   Anesthesia type (None, local, MAC, general) ? IV SEDATION   Julaine Hua 01/03/2019, 4:44 PM  _________________________________________________________________   (provider comments below)

## 2019-01-04 NOTE — Telephone Encounter (Signed)
   Primary Cardiologist: Dr Lovena Le  Chart reviewed as part of pre-operative protocol coverage. Cataract extractions are recognized in guidelines as low risk surgeries that do not typically require specific preoperative testing or holding of blood thinner therapy. Therefore, given past medical history and time since last visit, based on ACC/AHA guidelines, Brandon [name] would be at acceptable risk for the planned procedure without further cardiovascular testing.   I will route this recommendation to the requesting party via Epic fax function and remove from pre-op pool.  Please call with questions.  Kerin Ransom, PA-C 01/04/2019, 2:10 PM

## 2019-01-11 DIAGNOSIS — H25812 Combined forms of age-related cataract, left eye: Secondary | ICD-10-CM | POA: Diagnosis not present

## 2019-01-11 DIAGNOSIS — H2512 Age-related nuclear cataract, left eye: Secondary | ICD-10-CM | POA: Diagnosis not present

## 2019-01-11 DIAGNOSIS — Z961 Presence of intraocular lens: Secondary | ICD-10-CM | POA: Diagnosis not present

## 2019-01-17 DIAGNOSIS — H04123 Dry eye syndrome of bilateral lacrimal glands: Secondary | ICD-10-CM | POA: Diagnosis not present

## 2019-01-29 ENCOUNTER — Ambulatory Visit (HOSPITAL_COMMUNITY)
Admission: RE | Admit: 2019-01-29 | Discharge: 2019-01-29 | Disposition: A | Discharge status: Home / Self Care | Payer: Medicare HMO | Source: Ambulatory Visit | Attending: Cardiology | Admitting: Cardiology

## 2019-01-29 ENCOUNTER — Other Ambulatory Visit: Payer: Self-pay

## 2019-01-29 VITALS — BP 120/76 | HR 68 | Wt 267.2 lb

## 2019-01-29 DIAGNOSIS — D509 Iron deficiency anemia, unspecified: Secondary | ICD-10-CM | POA: Insufficient documentation

## 2019-01-29 DIAGNOSIS — I352 Nonrheumatic aortic (valve) stenosis with insufficiency: Secondary | ICD-10-CM | POA: Diagnosis not present

## 2019-01-29 DIAGNOSIS — H9190 Unspecified hearing loss, unspecified ear: Secondary | ICD-10-CM | POA: Insufficient documentation

## 2019-01-29 DIAGNOSIS — I5022 Chronic systolic (congestive) heart failure: Secondary | ICD-10-CM

## 2019-01-29 DIAGNOSIS — K219 Gastro-esophageal reflux disease without esophagitis: Secondary | ICD-10-CM | POA: Diagnosis not present

## 2019-01-29 DIAGNOSIS — Z87891 Personal history of nicotine dependence: Secondary | ICD-10-CM | POA: Diagnosis not present

## 2019-01-29 DIAGNOSIS — E785 Hyperlipidemia, unspecified: Secondary | ICD-10-CM | POA: Diagnosis not present

## 2019-01-29 DIAGNOSIS — Z953 Presence of xenogenic heart valve: Secondary | ICD-10-CM | POA: Insufficient documentation

## 2019-01-29 DIAGNOSIS — N183 Chronic kidney disease, stage 3 (moderate): Secondary | ICD-10-CM | POA: Diagnosis not present

## 2019-01-29 DIAGNOSIS — J449 Chronic obstructive pulmonary disease, unspecified: Secondary | ICD-10-CM | POA: Diagnosis not present

## 2019-01-29 DIAGNOSIS — I480 Paroxysmal atrial fibrillation: Secondary | ICD-10-CM | POA: Insufficient documentation

## 2019-01-29 DIAGNOSIS — I38 Endocarditis, valve unspecified: Secondary | ICD-10-CM | POA: Diagnosis not present

## 2019-01-29 DIAGNOSIS — M109 Gout, unspecified: Secondary | ICD-10-CM | POA: Insufficient documentation

## 2019-01-29 DIAGNOSIS — Z823 Family history of stroke: Secondary | ICD-10-CM | POA: Diagnosis not present

## 2019-01-29 DIAGNOSIS — I130 Hypertensive heart and chronic kidney disease with heart failure and stage 1 through stage 4 chronic kidney disease, or unspecified chronic kidney disease: Secondary | ICD-10-CM | POA: Diagnosis not present

## 2019-01-29 DIAGNOSIS — I2510 Atherosclerotic heart disease of native coronary artery without angina pectoris: Secondary | ICD-10-CM | POA: Insufficient documentation

## 2019-01-29 DIAGNOSIS — Z8673 Personal history of transient ischemic attack (TIA), and cerebral infarction without residual deficits: Secondary | ICD-10-CM | POA: Diagnosis not present

## 2019-01-29 DIAGNOSIS — Z7901 Long term (current) use of anticoagulants: Secondary | ICD-10-CM | POA: Diagnosis not present

## 2019-01-29 DIAGNOSIS — Z79899 Other long term (current) drug therapy: Secondary | ICD-10-CM | POA: Diagnosis not present

## 2019-01-29 DIAGNOSIS — Z951 Presence of aortocoronary bypass graft: Secondary | ICD-10-CM

## 2019-01-29 DIAGNOSIS — I4892 Unspecified atrial flutter: Secondary | ICD-10-CM | POA: Diagnosis not present

## 2019-01-29 DIAGNOSIS — I5042 Chronic combined systolic (congestive) and diastolic (congestive) heart failure: Secondary | ICD-10-CM | POA: Diagnosis not present

## 2019-01-29 LAB — BASIC METABOLIC PANEL
ANION GAP: 12 (ref 5–15)
BUN: 30 mg/dL — ABNORMAL HIGH (ref 8–23)
CALCIUM: 9.1 mg/dL (ref 8.9–10.3)
CO2: 22 mmol/L (ref 22–32)
CREATININE: 1.98 mg/dL — AB (ref 0.61–1.24)
Chloride: 106 mmol/L (ref 98–111)
GFR, EST AFRICAN AMERICAN: 38 mL/min — AB (ref >60)
GFR, EST NON AFRICAN AMERICAN: 33 mL/min — AB (ref >60)
Glucose, Bld: 109 mg/dL — ABNORMAL HIGH (ref 70–99)
Potassium: 4.8 mmol/L (ref 3.5–5.1)
SODIUM: 140 mmol/L (ref 135–145)

## 2019-01-29 LAB — CBC
HCT: 40.6 % (ref 39.0–52.0)
Hemoglobin: 13.3 g/dL (ref 13.0–17.0)
MCH: 32.8 pg (ref 26.0–34.0)
MCHC: 32.8 g/dL (ref 30.0–36.0)
MCV: 100 fL (ref 80.0–100.0)
NRBC: 0 % (ref 0.0–0.2)
PLATELETS: 206 10*3/uL (ref 150–400)
RBC: 4.06 MIL/uL — AB (ref 4.22–5.81)
RDW: 12.3 % (ref 11.5–15.5)
WBC: 6.4 10*3/uL (ref 4.0–10.5)

## 2019-01-29 LAB — LIPID PANEL
CHOL/HDL RATIO: 4 ratio
CHOLESTEROL: 163 mg/dL (ref 0–200)
HDL: 41 mg/dL (ref >40)
LDL Cholesterol: 86 mg/dL (ref 0–99)
Triglycerides: 182 mg/dL — ABNORMAL HIGH (ref <150)
VLDL: 36 mg/dL (ref 0–40)

## 2019-01-29 NOTE — Patient Instructions (Signed)
Labs today We will only contact you if something comes back abnormal or we need to make some changes. Otherwise no news is good news!  Your physician recommends that you schedule a follow-up appointment in: 6 months with Dr. Aundra Dubin. Please call us in June if you do not receive a call to schedule this appointment.

## 2019-01-29 NOTE — Progress Notes (Signed)
Advanced Heart Failure Clinic Note   PCP: Dr. Justin Mend Cardiology: Dr. Radford Pax HF Cardiology: Dr. Aundra Dubin  73 y.o.with history of CVA, paroxysmal atrial fibrillation, valvular heart disease, and chronic diastolic CHF presents for evaluation of CHF and valvular heart disease.    He had had episodes of dyspnea and initial workup led to a TEE in 5/18 showing normal EF with moderate AS and moderate MR.  He continued to have episodic dyspnea and ended up admitted in 8/18 with shortness of breath and chest pressure.  This led to a long, complicated hospitalization.  He was noted to be volume overloaded with acute diastolic CHF and was also noted to have symptomatic runs of atrial fibrillation with RVR.  Amiodarone was started to control the atrial fibrillation.  He developed respiratory distress => Bipap => intubated.  He became hypotensive, requiring pressors.  He developed AKI.  PNA was noted and he received broad spectrum abx.  He had a prolonged intubation but was eventually extubated.  Given deconditioning from long hospitalization, he went to CIR for a couple of weeks.  LHC in 9/18 showed occluded PDA with left>right collaterals.  TEE in 9/18 showed EF 50%, severe MR (possibly infarct-related with restricted posterior leaflet, and moderate AS + moderate-severe AI (possibly rheumatic).   In 12/18, he had cardiac surgery with bioprosthetic aortic and mitral valves placed.  He had SVG-PDA, Maze, and LA appendage clipping.  Post-op course was complicated by CHB requiring Medtronic dual chamber PPM (His bundle lead placed but captured right bundle so induced LBBB).   Echo 01/02/18 LVEF 30-35%, bioprosthetic MV and AoV function normally, RV mildly dilated with normal systolic function.    He was noted to be in atrial flutter with mild RVR and had TEE-guided DCCV on 04/11/18. TEE showed EF 25-30%, normal bioprosthetic aortic and mitral valves.  Echo in 6/19 showed EF 30-35%, normal bioprosthetic aortic and mitral  valves.  Echo 12/19 with EF up to 50-55%, normal bioprosthetic mitral and aortic valves, normal RV.   He returns today for followup of CHF and valvular disease.  Weight is up 3 lbs. He is doing well symptomatically.  No exertional dyspnea, no chest pain.  No BRBPR/melena. No palpitations.    ECG (personally reviewed): a-paced, PACs.   Labs (9/18): K 4.5, creatinine 1.42, LDL 90, HDL 36, LFTs normal, TSH normal, hgb 9.6 Labs (10/18): K 4.3, creatinine 1.45 Labs (12/18): K 3.8, creatinine 1.56 Labs (1/19): creatinine 1.68, LDL 66 Labs (3/19): K 4.2, creatinine 1.56 Labs (4/19): K 4.4, creatinine 1.71, hgb 11.8, LDL 75, HDL 43 Labs (6/19): K 4.8, creatinine 2.1, LDL 75, TGs 246 Labs (8/19): K 4.6, creatinine 2.07 Labs (11/19): K 4.6, creatinine 1.29  PMH: 1. CVA: Right MCA, 2004.  Minimal residual problems.  2. HTN 3. Hyperlipidemia 4. Left rotator cuff surgery 6/18 5. Carotid stenosis: s/p right carotid stent.  6. GERD 7. H/o CCY 8. Anemia: FOBT negative.  9. Gout  10. Atrial fibrillation: Paroxysmal. Maze in 12/18 with LAA clipping.  11. Valvular heart disease: TEE (5/18) with EF 55-60%, moderate AS mean 33 mmHg, moderate MR, normal RV size and systolic function.  - Echo (8/18): EF 55-60%, moderate LVH, moderate AS with mean gradient 33 mmHg, moderate AI, moderate to severe MR.  - TEE (9/18): EF 50%, mild LV dilation, suspect severe MR with posterior leaflet restricted (?infarct-related MR), ERO 0.4 cm^2, moderate AS/moderate-severe AI (possibly rheumatic).   - In 12/18, he had cardiac surgery with bioprosthetic aortic and mitral  valves placed.  He had SVG-PDA, Maze, and LA appendage clipping. - Echo (1/19): LVEF 30-35%, bioprosthetic MV and AoV function normally, RV mildly dilated with normal systolic function.  - TEE (5/19) with EF 25-30%, normal bioprosthetic aortic valve, normal bioprosthetic mitral valve. - Echo (6/19): EF 30-35%, mild LV dilation with diffuse hypokinesis, normal  RV size with mildly decreased systolic function, normal-appearing bioprosthetic mitral and aortic valves.   - Echo (12/19): EF 50-55%, moderate LVH, bioprosthetic aortic valve with mean gradient 10 mmHg, normally-functioning bioprosthetic mitral valve.  12. Chronic systolic CHF 13. Deafness 14. CKD: stage 3.  15. 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  17. COPD: Mild obstruction on 9/18 PFTs.  18. Post-op CHB in 12/18 with Medtronic dual chamber PPM.  19. Chronic systolic CHF: Echo (8/50) with EF down to 30-35% post-op.  - TEE (5/19) with EF 25-30%, normal bioprosthetic aortic valve, normal bioprosthetic mitral valve.  - Echo (12/19): EF 50-55%, moderate LVH, bioprosthetic aortic valve with mean gradient 10 mmHg, normally-functioning bioprosthetic mitral valve.  20. Atrial flutter: DCCV in 5/19.   SH: Quit smoking 8/18.  Married, 2 children, retired.    Family History  Problem Relation Age of Onset  . Stroke Father        No details  . Angina Mother    Review of systems complete and found to be negative unless listed in HPI.    Current Outpatient Medications  Medication Sig Dispense Refill  . acetaminophen (TYLENOL) 325 MG tablet Take 1 tablet (325 mg total) by mouth every 6 (six) hours as needed for mild pain. 30 tablet 0  . calcium carbonate (TUMS - DOSED IN MG ELEMENTAL CALCIUM) 500 MG chewable tablet Chew 1 tablet by mouth daily as needed for indigestion or heartburn.    . carvedilol (COREG) 6.25 MG tablet Take 2 tablets (12.5 mg) by mouth 2 (two) times daily. 360 tablet 3  . cetirizine (ZYRTEC) 10 MG tablet Take 10 mg by mouth daily as needed for allergies.    . citalopram (CELEXA) 20 MG tablet Take 1 tablet (20 mg total) by mouth at bedtime. 30 tablet 5  . docusate sodium (COLACE) 100 MG capsule Take 1 capsule (100 mg total) by mouth 3  (three) times daily as needed. 20 capsule 0  . ENTRESTO 24-26 MG TAKE 1 TABLET BY MOUTH TWICE DAILY 60 tablet 5  . furosemide (LASIX) 40 MG tablet Take 1 tablet (40 mg total) by mouth daily. 30 tablet 3  . HYDROcodone-acetaminophen (NORCO/VICODIN) 5-325 MG tablet Take 1 tablet by mouth every 6 (six) hours as needed for moderate pain. 20 tablet 0  . ipratropium (ATROVENT) 0.03 % nasal spray Place 2 sprays into both nostrils 2 (two) times daily.     [name] lovastatin (MEVACOR) 40 MG tablet TAKE 1 TABLET AT BEDTIME  (NEW  DOSE) 90 tablet 3  . methocarbamol (ROBAXIN) 500 MG tablet Take 1 tablet (500 mg total) by mouth every 8 (eight) hours as needed for muscle spasms. 30 tablet 0  . ondansetron (ZOFRAN) 4 MG tablet Take 4 mg by mouth every 8 (eight) hours as needed for nausea or vomiting.    . pantoprazole (PROTONIX) 40 MG tablet Take 1 tablet (40 mg total) by mouth daily. 30 tablet 1  . spironolactone (ALDACTONE) 25 MG tablet TAKE 1 TABLET EVERY DAY 90 tablet 0  .  traZODone (DESYREL) 50 MG tablet Take 100 mg by mouth at bedtime as needed for sleep.    [name] warfarin (COUMADIN) 5 MG tablet TAKE 1 TABLET BY MOUTH AS DIRECTED BY COUMADIN CLINIC 90 tablet 1   No current facility-administered medications for this encounter.    Vitals:   01/29/19 1135  BP: 120/76  Pulse: 68  SpO2: 97%  Weight: 121.2 kg (267 lb 3.2 oz)   Wt Readings from Last 3 Encounters:  01/29/19 121.2 kg (267 lb 3.2 oz)  11/28/18 121.1 kg (267 lb)  11/26/18 116.1 kg (256 lb)   General: NAD Neck: No JVD, no thyromegaly or thyroid nodule.  Lungs: Clear to auscultation bilaterally with normal respiratory effort. CV: Nondisplaced PMI.  Heart regular S1/S2, no S3/S4, 2/6 early SEM RUSB.  No peripheral edema.  No carotid bruit.  Normal pedal pulses.  Abdomen: Soft, nontender, no hepatosplenomegaly, no distention.  Skin: Intact without lesions or rashes.  Neurologic: Alert and oriented x 3.  Psych: Normal affect. Extremities: No  clubbing or cyanosis.  HEENT: Normal.    Assessment/Plan: 1. Valvular heart disease: TEE in 9/18 showed severe MR, probably infarct-related with restrictive posterior mitral leaflet. There was moderate aortic stenosis and moderate-severe aortic insufficiency, possible rheumatic.  In 12/18, he had bioprosthetic MVR and AVR.  - Valves stable on 12/19 echo.  - Antibiotic prophylaxis needed with dental work.  2. Chronic combined CHF: In setting of significant valvular disease as above. EF down to 30-35% post-op (1/19 echo), likely reflects true LV systolic function without the volume load from severe MR and moderate-severe AI.  TEE in 5/19 showed EF 25-30%.  Repeat echo in 6/19 with EF 30-35%. Repeat echo in 12/19 showed EF up to 50-55%.  He is not volume overloaded on exam.  NYHA class II symptoms.  He is only very rarely RV-pacing now.  - EF out of range for ICD.  - Continue Lasix 40 mg daily.  BMET today.    - Continue Coreg 12.5 mg bid.   - Continue Entresto 24/26 bid.  - Continue spironolactone 25 mg daily.  BMET today.  3. CKD: Stage 3. BMET today.  4. Atrial fibrillation/flutter: Paroxysmal. He has had a Maze procedure.  He tends to tolerate atrial arrhythmias poorly.  Most recently, he had atrial flutter with DCCV in 5/19.  He is now maintaining NSR off amiodarone.  - He is on warfarin.  5. COPD: Mild obstruction on PFTs.  He has quit smoking.  Stable. No change.   6. CVA: Remote, minimal residual. S/p carotid stenting.  - No ASA given warfarin use.  7. Hyperlipidemia: Check lipids today.     8. Anemia: Fe deficiency.   9. 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  The artery to the PDA was occluded with left to right collaterals.  He had SVG-PDA in 12/18. No chest pain.  - Continue statin.  Lipids today.   - No ASA given warfarin use.  10. CHB: has Medtronic PPM.  Minimal RV  pacing now.   Followup in 6 months.   Loralie Champagne 01/29/2019

## 2019-01-30 ENCOUNTER — Other Ambulatory Visit (HOSPITAL_COMMUNITY): Payer: Self-pay

## 2019-01-30 MED ORDER — ROSUVASTATIN CALCIUM 10 MG PO TABS: 10.0000 mg | ORAL_TABLET | Freq: Every day | ORAL | 1 refills | Status: DC

## 2019-01-31 DIAGNOSIS — H2511 Age-related nuclear cataract, right eye: Secondary | ICD-10-CM | POA: Diagnosis not present

## 2019-01-31 DIAGNOSIS — H25811 Combined forms of age-related cataract, right eye: Secondary | ICD-10-CM | POA: Diagnosis not present

## 2019-02-04 MED FILL — ENTRESTO 24 MG-26 MG TABLET: 24-26 | 30 days supply | Qty: 60 | Fill #1

## 2019-02-13 ENCOUNTER — Ambulatory Visit (INDEPENDENT_AMBULATORY_CARE_PROVIDER_SITE_OTHER): Payer: Medicare HMO | Admitting: Pharmacist

## 2019-02-13 DIAGNOSIS — Z953 Presence of xenogenic heart valve: Secondary | ICD-10-CM

## 2019-02-13 DIAGNOSIS — Z5181 Encounter for therapeutic drug level monitoring: Secondary | ICD-10-CM

## 2019-02-13 DIAGNOSIS — Z951 Presence of aortocoronary bypass graft: Secondary | ICD-10-CM | POA: Diagnosis not present

## 2019-02-13 DIAGNOSIS — Z8679 Personal history of other diseases of the circulatory system: Secondary | ICD-10-CM

## 2019-02-13 DIAGNOSIS — Z9889 Other specified postprocedural states: Secondary | ICD-10-CM | POA: Diagnosis not present

## 2019-02-13 LAB — POCT INR: INR: 1.9 — AB (ref 2.0–3.0)

## 2019-02-13 NOTE — Patient Instructions (Addendum)
Description   Take 1 and 1/2 tablets tonight (7.5 mg), then continue taking 1 tablet everyday starting tomorrow.  Recheck INR in 3 weeks.  Call with any new medications scheduled procedures or any questions  [phone]

## 2019-02-22 ENCOUNTER — Other Ambulatory Visit: Payer: Self-pay | Admitting: *Deleted

## 2019-02-22 MED ORDER — FUROSEMIDE 40 MG PO TABS: 40.0000 mg | ORAL_TABLET | Freq: Every day | ORAL | 6 refills | Status: DC

## 2019-02-25 ENCOUNTER — Other Ambulatory Visit (HOSPITAL_COMMUNITY): Payer: Self-pay | Admitting: *Deleted

## 2019-02-25 ENCOUNTER — Encounter (HOSPITAL_COMMUNITY): Payer: Self-pay

## 2019-02-25 MED ORDER — FUROSEMIDE 40 MG PO TABS: 40.0000 mg | ORAL_TABLET | Freq: Every day | ORAL | 6 refills | Status: DC

## 2019-02-26 ENCOUNTER — Other Ambulatory Visit: Payer: Self-pay

## 2019-02-26 ENCOUNTER — Encounter: Payer: Medicare HMO | Admitting: *Deleted

## 2019-03-03 ENCOUNTER — Other Ambulatory Visit (HOSPITAL_COMMUNITY): Payer: Self-pay | Admitting: Cardiology

## 2019-03-04 ENCOUNTER — Telehealth: Payer: Self-pay

## 2019-03-04 MED FILL — ENTRESTO 24 MG-26 MG TABLET: 24-26 | 30 days supply | Qty: 60 | Fill #2

## 2019-03-04 NOTE — Telephone Encounter (Signed)
1. Do you currently have a fever?NO (yes = cancel and refer to pcp for e-visit) 2. Have you recently travelled on a cruise, internationally, or to [place], [place], [place], [place], [place], or [place], [place] [employer]) ? NO (yes = cancel, stay home, monitor symptoms, and contact pcp or initiate e-visit if symptoms develop) 3. Have you been in contact with someone that is currently pending confirmation of Covid19 testing or has been confirmed to have the [place] virus?  NO (yes = cancel, stay home, away from tested individual, monitor symptoms, and contact pcp or initiate e-visit if symptoms develop) 4. Are you currently experiencing fatigue or cough? YES BUT HAS COPD(yes = pt should be prepared to have a mask placed at the time of their visit).  Pt. Advised that we are restricting visitors at this time and anyone present in the vehicle should meet the above criteria as well. Advised that visit will be at curbside for finger stick ONLY and will receive call with instructions. Pt also advised to please bring own pen for signature of arrival document.

## 2019-03-06 ENCOUNTER — Ambulatory Visit (INDEPENDENT_AMBULATORY_CARE_PROVIDER_SITE_OTHER): Payer: Medicare HMO

## 2019-03-06 ENCOUNTER — Ambulatory Visit (INDEPENDENT_AMBULATORY_CARE_PROVIDER_SITE_OTHER): Payer: Medicare HMO | Admitting: *Deleted

## 2019-03-06 ENCOUNTER — Other Ambulatory Visit: Payer: Self-pay

## 2019-03-06 DIAGNOSIS — Z951 Presence of aortocoronary bypass graft: Secondary | ICD-10-CM | POA: Diagnosis not present

## 2019-03-06 DIAGNOSIS — Z9889 Other specified postprocedural states: Secondary | ICD-10-CM | POA: Diagnosis not present

## 2019-03-06 DIAGNOSIS — Z8679 Personal history of other diseases of the circulatory system: Secondary | ICD-10-CM

## 2019-03-06 DIAGNOSIS — I480 Paroxysmal atrial fibrillation: Secondary | ICD-10-CM | POA: Diagnosis not present

## 2019-03-06 DIAGNOSIS — I442 Atrioventricular block, complete: Secondary | ICD-10-CM

## 2019-03-06 DIAGNOSIS — Z5181 Encounter for therapeutic drug level monitoring: Secondary | ICD-10-CM

## 2019-03-06 DIAGNOSIS — Z953 Presence of xenogenic heart valve: Secondary | ICD-10-CM | POA: Diagnosis not present

## 2019-03-06 LAB — POCT INR: INR: 1.9 — AB (ref 2.0–3.0)

## 2019-03-06 NOTE — Patient Instructions (Signed)
Description   Called spoke with pt's wife, advised to have pt start taking 1 tablet daily except 1.5 tablets on Wednesdays.  Recheck INR in 4 weeks.  Call with any new medications scheduled procedures or any questions  [phone]

## 2019-03-09 LAB — CUP PACEART REMOTE DEVICE CHECK
Battery Voltage: 3.04 V
Brady Statistic AP VP Percent: 0.74 %
Brady Statistic AS VP Percent: 0.08 %
Brady Statistic RA Percent Paced: 43.35 %
Date Time Interrogation Session: 20200325175602
Implantable Lead Implant Date: 20181211
Implantable Lead Implant Date: 20181211
Implantable Lead Location: 753859
Implantable Lead Model: 3830
Implantable Lead Model: 5076
Lead Channel Impedance Value: 266 Ohm
Lead Channel Impedance Value: 513 Ohm
Lead Channel Pacing Threshold Amplitude: 0.5 V
Lead Channel Pacing Threshold Pulse Width: 0.4 ms
Lead Channel Sensing Intrinsic Amplitude: 4.75 mV
Lead Channel Setting Pacing Amplitude: 2 V
Lead Channel Setting Pacing Pulse Width: 1 ms
Lead Channel Setting Sensing Sensitivity: 1.2 mV
MDC IDC LEAD LOCATION: 753860
MDC IDC MSMT BATTERY REMAINING LONGEVITY: 137 mo
MDC IDC MSMT LEADCHNL RA IMPEDANCE VALUE: 323 Ohm
MDC IDC MSMT LEADCHNL RA IMPEDANCE VALUE: 380 Ohm
MDC IDC MSMT LEADCHNL RA SENSING INTR AMPL: 3.625 mV
MDC IDC MSMT LEADCHNL RA SENSING INTR AMPL: 3.625 mV
MDC IDC MSMT LEADCHNL RV PACING THRESHOLD AMPLITUDE: 1.125 V
MDC IDC MSMT LEADCHNL RV PACING THRESHOLD PULSEWIDTH: 0.4 ms
MDC IDC MSMT LEADCHNL RV SENSING INTR AMPL: 4.75 mV
MDC IDC PG IMPLANT DT: 20181211
MDC IDC SET LEADCHNL RV PACING AMPLITUDE: 2.5 V
MDC IDC STAT BRADY AP VS PERCENT: 44 %
MDC IDC STAT BRADY AS VS PERCENT: 55.2 %
MDC IDC STAT BRADY RV PERCENT PACED: 0.79 %

## 2019-03-10 ENCOUNTER — Other Ambulatory Visit (HOSPITAL_COMMUNITY): Payer: Self-pay

## 2019-03-11 ENCOUNTER — Encounter: Payer: Self-pay | Admitting: Cardiology

## 2019-03-11 MED ORDER — SPIRONOLACTONE 25 MG PO TABS: 25.0000 mg | ORAL_TABLET | Freq: Every day | ORAL | 1 refills | Status: DC

## 2019-03-11 NOTE — Progress Notes (Signed)
Remote pacemaker transmission.   

## 2019-03-14 ENCOUNTER — Telehealth: Payer: Self-pay | Admitting: *Deleted

## 2019-03-14 NOTE — Telephone Encounter (Signed)
Spoke with pt's wife regarding PPM alert. She states he is still sleeping and will be up around noon. She states he has been more fatigued lately but she assumed he was depressed about being at home during social distancing.   She asked we call back in the early afternoon to get a better assessment of pt's symptoms. I agreed and will call later today.

## 2019-03-14 NOTE — Telephone Encounter (Signed)
Spoke with pt regarding his alert. He states he has been feeling well, nothing out of the ordinary for him. He denies SOB, CP, palpitations, dizziness or syncope. His wife disagrees stating she has noticed he is SOB from time to time.   He states he is compliant with all medications including warfarin, lasix, entresto, and carvedilol.   I advised him we would forward to Dr. Lovena Le for review and call him back with any recommendations.   He verbalized understanding and had no additional questions.

## 2019-03-14 NOTE — Telephone Encounter (Signed)
Received triage alert for increased AT/AF burden, now 80.3% since last transmission on 03/06/19. Presenting rhythm shows A-flutter/Vs at 109bpm, possibly persistent as no clear termination is noted on available stored EGMs. Will contact patient regarding symptoms and to ensure med list is accurate.

## 2019-03-30 MED FILL — ENTRESTO 24 MG-26 MG TABLET: 24-26 | 30 days supply | Qty: 60 | Fill #3

## 2019-04-01 ENCOUNTER — Other Ambulatory Visit (HOSPITAL_COMMUNITY): Payer: Medicare HMO

## 2019-04-02 ENCOUNTER — Telehealth: Payer: Self-pay

## 2019-04-02 NOTE — Telephone Encounter (Signed)

## 2019-04-03 ENCOUNTER — Other Ambulatory Visit: Payer: Self-pay

## 2019-04-03 ENCOUNTER — Ambulatory Visit (INDEPENDENT_AMBULATORY_CARE_PROVIDER_SITE_OTHER): Payer: 59 | Admitting: Pharmacist

## 2019-04-03 DIAGNOSIS — Z5181 Encounter for therapeutic drug level monitoring: Secondary | ICD-10-CM

## 2019-04-03 DIAGNOSIS — Z953 Presence of xenogenic heart valve: Secondary | ICD-10-CM

## 2019-04-03 DIAGNOSIS — Z8679 Personal history of other diseases of the circulatory system: Secondary | ICD-10-CM | POA: Diagnosis not present

## 2019-04-03 DIAGNOSIS — Z9889 Other specified postprocedural states: Secondary | ICD-10-CM

## 2019-04-03 DIAGNOSIS — I480 Paroxysmal atrial fibrillation: Secondary | ICD-10-CM | POA: Diagnosis not present

## 2019-04-03 DIAGNOSIS — Z951 Presence of aortocoronary bypass graft: Secondary | ICD-10-CM

## 2019-04-03 LAB — POCT INR: INR: 2.2 (ref 2.0–3.0)

## 2019-04-30 ENCOUNTER — Telehealth (HOSPITAL_COMMUNITY): Payer: Self-pay

## 2019-04-30 ENCOUNTER — Telehealth: Payer: Self-pay

## 2019-04-30 MED FILL — ENTRESTO 24 MG-26 MG TABLET: 24-26 | 30 days supply | Qty: 60 | Fill #4

## 2019-04-30 NOTE — Telephone Encounter (Signed)

## 2019-04-30 NOTE — Telephone Encounter (Signed)
COVID-19 pre-appointment screening questions:   Do you have a history of COVID-19 or a positive test result in the past 7-10 days? no  To the best of your knowledge, have you been in close contact with anyone with a confirmed diagnosis of COVID 19? no  Have you had any one or more of the following: Fever, chills, cough, shortness of breath (out of the normal for you) or any flu-like symptoms? no  Are you experiencing any of the following symptoms that is new or out of usual for you: n/a  . Ear, nose or throat discomfort . Sore throat . Headache . Muscle Pain . Diarrhea . Loss of taste or smell   Reviewed all the following with patient: . Use of hand sanitizer when entering the building . Everyone is required to wear a mask in the building, if you do not have a mask we are happy to provide you with one when you arrive . NO Visitor guidelines   If patient answers YES to any of questions they must change to a virtual visit and place note in comments about symptoms

## 2019-05-01 ENCOUNTER — Other Ambulatory Visit: Payer: Self-pay

## 2019-05-01 ENCOUNTER — Ambulatory Visit (HOSPITAL_COMMUNITY)
Admission: RE | Admit: 2019-05-01 | Discharge: 2019-05-01 | Disposition: A | Discharge status: Home / Self Care | Payer: Medicare HMO | Source: Ambulatory Visit | Attending: Internal Medicine | Admitting: Internal Medicine

## 2019-05-01 ENCOUNTER — Ambulatory Visit (INDEPENDENT_AMBULATORY_CARE_PROVIDER_SITE_OTHER): Payer: Medicare HMO | Admitting: *Deleted

## 2019-05-01 DIAGNOSIS — Z951 Presence of aortocoronary bypass graft: Secondary | ICD-10-CM

## 2019-05-01 DIAGNOSIS — I5022 Chronic systolic (congestive) heart failure: Secondary | ICD-10-CM | POA: Diagnosis not present

## 2019-05-01 DIAGNOSIS — Z5181 Encounter for therapeutic drug level monitoring: Secondary | ICD-10-CM

## 2019-05-01 DIAGNOSIS — I480 Paroxysmal atrial fibrillation: Secondary | ICD-10-CM | POA: Diagnosis not present

## 2019-05-01 DIAGNOSIS — Z9889 Other specified postprocedural states: Secondary | ICD-10-CM | POA: Diagnosis not present

## 2019-05-01 DIAGNOSIS — Z8679 Personal history of other diseases of the circulatory system: Secondary | ICD-10-CM | POA: Diagnosis not present

## 2019-05-01 DIAGNOSIS — Z953 Presence of xenogenic heart valve: Secondary | ICD-10-CM | POA: Diagnosis not present

## 2019-05-01 LAB — HEPATIC FUNCTION PANEL
ALT: 12 U/L (ref 0–44)
AST: 16 U/L (ref 15–41)
Albumin: 4.1 g/dL (ref 3.5–5.0)
Alkaline Phosphatase: 28 U/L — ABNORMAL LOW (ref 38–126)
Bilirubin, Direct: <0.1 mg/dL (ref 0.0–0.2)
Total Bilirubin: 0.5 mg/dL (ref 0.3–1.2)
Total Protein: 7.5 g/dL (ref 6.5–8.1)

## 2019-05-01 LAB — POCT INR: INR: 3 (ref 2.0–3.0)

## 2019-05-01 LAB — LIPID PANEL
Cholesterol: 125 mg/dL (ref 0–200)
HDL: 39 mg/dL — ABNORMAL LOW (ref >40)
LDL Cholesterol: 49 mg/dL (ref 0–99)
Total CHOL/HDL Ratio: 3.2 RATIO
Triglycerides: 187 mg/dL — ABNORMAL HIGH (ref <150)
VLDL: 37 mg/dL (ref 0–40)

## 2019-05-01 NOTE — Patient Instructions (Signed)
Description   Called spoke with pt and advised him to continue taking 1 tablet daily except 1.5 tablets on Wednesdays.  Recheck INR in 4 weeks.  Call with any new medications scheduled procedures or any questions  [phone]

## 2019-05-08 ENCOUNTER — Telehealth (HOSPITAL_COMMUNITY): Payer: Self-pay | Admitting: Licensed Clinical Social Worker

## 2019-05-08 ENCOUNTER — Ambulatory Visit (HOSPITAL_COMMUNITY)
Admission: RE | Admit: 2019-05-08 | Discharge: 2019-05-08 | Disposition: A | Discharge status: Home / Self Care | Payer: Medicare HMO | Source: Ambulatory Visit | Attending: Physician Assistant | Admitting: Physician Assistant

## 2019-05-08 ENCOUNTER — Other Ambulatory Visit (HOSPITAL_COMMUNITY): Payer: Self-pay

## 2019-05-08 ENCOUNTER — Encounter (HOSPITAL_COMMUNITY): Payer: Self-pay | Admitting: Physician Assistant

## 2019-05-08 ENCOUNTER — Other Ambulatory Visit (HOSPITAL_COMMUNITY): Payer: Self-pay | Admitting: *Deleted

## 2019-05-08 ENCOUNTER — Other Ambulatory Visit: Payer: Self-pay

## 2019-05-08 ENCOUNTER — Telehealth: Payer: Self-pay | Admitting: Student

## 2019-05-08 VITALS — BP 89/53 | HR 92 | Ht 68.5 in | Wt 267.0 lb

## 2019-05-08 DIAGNOSIS — I480 Paroxysmal atrial fibrillation: Secondary | ICD-10-CM

## 2019-05-08 MED ORDER — SACUBITRIL-VALSARTAN 24-26 MG PO TABS: 1.0000 | ORAL_TABLET | Freq: Two times a day (BID) | ORAL | 3 refills | Status: DC

## 2019-05-08 MED ORDER — AMIODARONE HCL 200 MG PO TABS: ORAL_TABLET | ORAL | 3 refills | Status: DC

## 2019-05-08 MED ORDER — FUROSEMIDE 40 MG PO TABS: 20.0000 mg | ORAL_TABLET | Freq: Every day | ORAL | 6 refills | Status: DC

## 2019-05-08 NOTE — Addendum Note (Signed)
Encounter addended by: Juluis Mire, RN on: 05/08/2019 4:05 PM  Actions taken: Order list changed

## 2019-05-08 NOTE — Telephone Encounter (Signed)
Non billable remote transmission. Trending data and function WNL.  Ongoing AT/AF previously noted burden 54.8%, Increased burden since end of 01/2019. Per Dr. Claris Gladden last note, pt tolerates atrial arrhythmias poorly. Called patient who is symptomatic, with NYHA III+ symptoms. Will request follow up in Afib clinic.  On Warfarin   Lollie Marrow, [place] 05/08/2019 9:15 AM

## 2019-05-08 NOTE — Telephone Encounter (Signed)
Added on for this afternoon.

## 2019-05-08 NOTE — Progress Notes (Signed)
Electrophysiology TeleHealth Note   Due to national recommendations of social distancing due to [place] 19, Audio/video telehealth visit is felt to be most appropriate for this patient at this time.  See consent below from today for patient consent regarding telehealth for the At[facility] Clinic. Consent obtained verbally.   Date:  05/08/2019   ID:  Brandon [name], DOB Apr 20, 1946, MRN [id number]  Location: home  Provider location: [address] [place],  [address] Evaluation Performed: Follow up  PCP:  Maurice Small, MD  Primary Cardiologist:  Dr Aundra Dubin Primary Electrophysiologist: Dr Lovena Le   CC: Follow up for atrial fibrillation.   History of Present Illness: Brandon [name] is a 73 y.o. male who presents via audio/video conferencing for a telehealth visit today. Patient reports that over the last month he has had more dyspnea on exertion and increased fatigue. Remote transmission shows substantially increased AF burden mid March 2020. Overall heart rates appear controlled around 90s-100s. Patient denies any symptoms of heart racing, palpitations, or fluid overload. No triggers that patient could identify.   Today, he denies symptoms of palpitations, chest pain, orthopnea, PND, lower extremity edema, claudication, dizziness, presyncope, syncope, bleeding, or neurologic sequela. The patient is tolerating medications without difficulties and is otherwise without complaint today.   he denies symptoms of cough, fevers, chills, or new SOB worrisome for COVID 19.     Atrial Fibrillation Risk Factors:  he does have symptoms or diagnosis of sleep apnea. he is not compliant with CPAP therapy.   he has a BMI of Body mass index is 40.01 kg/m.[name] Filed Weights   05/08/19 1343  Weight: 121.1 kg    Past Medical History:  Diagnosis Date  . Anemia 07/13/2017  . Anxiety   . Aortic insufficiency   . Aortic stenosis, moderate 07/13/2017  . Arthritis    back   . Carotid  stenosis    Right carotid stent (widely patent) 40 - 59% left plaque 11/13  . COPD (chronic obstructive pulmonary disease) ([place])   . Coronary artery disease involving native coronary artery of native heart without angina pectoris   . Depression   . Dyslipidemia   . GERD (gastroesophageal reflux disease)   . Heart murmur   . Hemiplegia affecting unspecified side, late effect of cerebrovascular disease    resolved- from L side   . Hypertension   . Jaundice    resolved following ERCP & Cholecystectomy  . Mild emphysema ([place])   . Mitral regurgitation   . Mitral valve insufficiency and aortic valve insufficiency   . Myocardial infarction ([place])   . Paroxysmal atrial fibrillation ([facility])   . Pre-diabetes    per spouse  . S/P aortic valve replacement with bioprosthetic valve 11/16/2017   23 mm [facility] Ease stented bovine pericardial tissue valve  . S/P CABG x 1 11/16/2017   SVG to PDA with EVH via right thigh  . S/P [place] operation for atrial fibrillation 11/16/2017   Left side lesion set using bipolar radiofrequency and cryothermy ablation with clipping of LA appendage  . S/P mitral valve replacement with bioprosthetic valve 11/16/2017   27 mm [facility] Mitral stented bovine pericardial tissue valve  . Sleep apnea    does not wear CPAP  . Sleep concern    resulted in surgery- after + sleep test. Pt. doesn't have a problem any longer.   . Stroke ([place]) 03/11/2003   stent placed on the 31, 3, 2004, L side   .  Wears glasses   . Wears hearing aid in both ears   . Wears partial dentures    Past Surgical History:  Procedure Laterality Date  . AORTIC VALVE REPLACEMENT N/A 11/16/2017   Procedure: AORTIC VALVE REPLACEMENT (AVR) with 8mm Magna Ease;  Surgeon: Rexene Alberts, MD;  Location: [place];  Service: Open Heart Surgery;  Laterality: N/A;  . ARTERIAL LINE INSERTION Right 11/16/2017   Procedure: ARTERIAL LINE INSERTION -RIGHT FEMORAL;  Surgeon: Rexene Alberts, MD;  Location: [place];   Service: Open Heart Surgery;  Laterality: Right;  . BACK SURGERY     lumbar back  . CARDIOVERSION N/A 04/11/2018   Procedure: CARDIOVERSION;  Surgeon: Larey Dresser, MD;  Location: [facility] ENDOSCOPY;  Service: Cardiovascular;  Laterality: N/A;  . CHOLECYSTECTOMY    . CORONARY ARTERY BYPASS GRAFT N/A 11/16/2017   Procedure: CORONARY ARTERY BYPASS GRAFTING (CABG) x 1;  Surgeon: Rexene Alberts, MD;  Location: [place];  Service: Open Heart Surgery;  Laterality: N/A;  . ENDOVEIN HARVEST OF GREATER SAPHENOUS VEIN Right 11/16/2017   Procedure: ENDOVEIN HARVEST OF GREATER SAPHENOUS VEIN;  Surgeon: Rexene Alberts, MD;  Location: [place];  Service: Open Heart Surgery;  Laterality: Right;  . ERCP N/A 05/31/2013   Procedure: ENDOSCOPIC RETROGRADE CHOLANGIOPANCREATOGRAPHY (ERCP);  Surgeon: Ladene Artist, MD;  Location: [facility];  Service: Endoscopy;  Laterality: N/A;  . FOOT SURGERY     right  . IR RADIOLOGY PERIPHERAL GUIDED IV START  09/28/2017  . IR US GUIDE VASC ACCESS RIGHT  09/28/2017  . LAPAROSCOPIC CHOLECYSTECTOMY SINGLE PORT N/A 06/01/2013   Procedure: LAPAROSCOPIC CHOLECYSTECTOMY SINGLE PORT;  Surgeon: Adin Hector, MD;  Location: [facility];  Service: General;  Laterality: N/A;  . MAZE N/A 11/16/2017   Procedure: MAZE;  Surgeon: Rexene Alberts, MD;  Location: [place];  Service: Open Heart Surgery;  Laterality: N/A;  . MITRAL VALVE REPAIR N/A 11/16/2017   Procedure: MITRAL VALVE  REPLACEMENT with 49mm MagnaEase;  Surgeon: Rexene Alberts, MD;  Location: [place];  Service: Open Heart Surgery;  Laterality: N/A;  . PACEMAKER IMPLANT N/A 11/21/2017   Procedure: PACEMAKER IMPLANT;  Surgeon: Evans Lance, MD;  Location: [place] CV LAB;  Service: Cardiovascular;  Laterality: N/A;  . POLYPECTOMY    . RIGHT/LEFT HEART CATH AND CORONARY ANGIOGRAPHY N/A 08/30/2017   Procedure: RIGHT/LEFT HEART CATH AND CORONARY ANGIOGRAPHY;  Surgeon: Larey Dresser, MD;  Location: [place] CV LAB;  Service:  Cardiovascular;  Laterality: N/A;  . SHOULDER ARTHROSCOPY WITH ROTATOR CUFF REPAIR AND SUBACROMIAL DECOMPRESSION Left 05/18/2017  . SHOULDER ARTHROSCOPY WITH ROTATOR CUFF REPAIR AND SUBACROMIAL DECOMPRESSION Left 05/18/2017   Procedure: SHOULDER ARTHROSCOPY WITH ROTATOR CUFF REPAIR AND SUBACROMIAL DECOMPRESSION;  Surgeon: Tania Ade, MD;  Location: [place];  Service: Orthopedics;  Laterality: Left;  LEFT SHOULDER ARTHROSCOPY WITH ROTATOR CUFF REPAIR AND SUBACROMIAL DECOMPRESSION  . TEE WITHOUT CARDIOVERSION N/A 05/09/2017   Procedure: TRANSESOPHAGEAL ECHOCARDIOGRAM (TEE);  Surgeon: Skeet Latch, MD;  Location: [place];  Service: Cardiovascular;  Laterality: N/A;  . TEE WITHOUT CARDIOVERSION N/A 08/30/2017   Procedure: TRANSESOPHAGEAL ECHOCARDIOGRAM (TEE);  Surgeon: Larey Dresser, MD;  Location: [facility] ENDOSCOPY;  Service: Cardiovascular;  Laterality: N/A;  . TEE WITHOUT CARDIOVERSION N/A 11/16/2017   Procedure: TRANSESOPHAGEAL ECHOCARDIOGRAM (TEE);  Surgeon: Rexene Alberts, MD;  Location: [place];  Service: Open Heart Surgery;  Laterality: N/A;  . TEE WITHOUT CARDIOVERSION N/A 04/11/2018   Procedure: TRANSESOPHAGEAL ECHOCARDIOGRAM (TEE);  Surgeon: Aundra Dubin,  Elby Showers, MD;  Location: [place] ENDOSCOPY;  Service: Cardiovascular;  Laterality: N/A;  . TONSILLECTOMY       Current Outpatient Medications  Medication Sig Dispense Refill  . acetaminophen (TYLENOL) 325 MG tablet Take 1 tablet (325 mg total) by mouth every 6 (six) hours as needed for mild pain. 30 tablet 0  . calcium carbonate (TUMS - DOSED IN MG ELEMENTAL CALCIUM) 500 MG chewable tablet Chew 1 tablet by mouth daily as needed for indigestion or heartburn.    . carvedilol (COREG) 6.25 MG tablet Take 2 tablets (12.5 mg) by mouth 2 (two) times daily. 360 tablet 3  . cetirizine (ZYRTEC) 10 MG tablet Take 10 mg by mouth daily as needed for allergies.    . citalopram (CELEXA) 20 MG tablet Take 1 tablet (20 mg total) by mouth at bedtime. 30 tablet  5  . docusate sodium (COLACE) 100 MG capsule Take 1 capsule (100 mg total) by mouth 3 (three) times daily as needed. 20 capsule 0  . furosemide (LASIX) 40 MG tablet Take 1 tablet (40 mg total) by mouth daily. 90 tablet 6  . ipratropium (ATROVENT) 0.03 % nasal spray Place 2 sprays into both nostrils 2 (two) times daily.     . pantoprazole (PROTONIX) 40 MG tablet Take 1 tablet (40 mg total) by mouth daily. 30 tablet 1  . Pseudoeph-Doxylamine-DM-APAP (NYQUIL PO) Take by mouth as needed.    . rosuvastatin (CRESTOR) 10 MG tablet Take 1 tablet (10 mg total) by mouth daily. 90 tablet 1  . sacubitril-valsartan (ENTRESTO) 24-26 MG Take 1 tablet by mouth 2 (two) times daily. 180 tablet 3  . spironolactone (ALDACTONE) 25 MG tablet Take 1 tablet (25 mg total) by mouth daily. 90 tablet 1  . traZODone (DESYREL) 50 MG tablet Take 100 mg by mouth at bedtime.     [name] warfarin (COUMADIN) 5 MG tablet TAKE 1 TABLET BY MOUTH AS DIRECTED BY COUMADIN CLINIC (Patient taking differently: TAKE 1 TABLET BY MOUTH AS DIRECTED BY COUMADIN CLINIC-extra 5 mg every Wednesday) 90 tablet 1  . methocarbamol (ROBAXIN) 500 MG tablet Take 1 tablet (500 mg total) by mouth every 8 (eight) hours as needed for muscle spasms. (Patient not taking: Reported on 05/08/2019) 30 tablet 0   No current facility-administered medications for this encounter.     Allergies:   Bee venom   Social History:  The patient  reports that he quit smoking about 21 years ago. His smoking use included cigarettes. He has a 28.50 pack-year smoking history. He has never used smokeless tobacco. He reports current alcohol use of about 2.0 - 3.0 standard drinks of alcohol per week. He reports that he does not use drugs.   Family History:  The patient's  family history includes Angina in his mother; Stroke in his father.    ROS:  Please see the history of present illness.   All other systems are personally reviewed and negative.   Exam: Well appearing, alert and  conversant, regular work of breathing,  good skin color  Recent Labs: 05/21/2018: TSH 5.876 01/29/2019: BUN 30; Creatinine, Ser 1.98; Hemoglobin 13.3; Platelets 206; Potassium 4.8; Sodium 140 05/01/2019: ALT 12  personally reviewed    Other studies personally reviewed: Additional studies/ records that were reviewed today include: Epic notes, echocardiogram  Echo  11/2018 - Left ventricle: The cavity size was normal. Wall thickness was   increased in a pattern of moderate LVH. Systolic function was   normal. The estimated ejection fraction was  in the range of 50%   to 55%. Septal hypokinesis. The study is not technically   sufficient to allow evaluation of LV diastolic function. - Aortic valve: s/p Bioprosthetic AVR - no obstruction. Mean   gradient (S): 10 mm Hg. Peak gradient (S): 21 mm Hg. Valve area   (VTI): 1.59 cm^2. Valve area (Vmax): 1.42 cm^2. Valve area   (Vmean): 1.52 cm^2. - Mitral valve: S/p bioprosthetic MVR - no obstruction. Valve area   by pressure half-time: 2.12 cm^2. Valve area by continuity   equation (using LVOT flow): 1.76 cm^2. - Left atrium: The atrium was at the upper limits of normal in   size. - Right ventricle: Pacer wire or catheter noted in right ventricle. - Right atrium: The atrium was mildly dilated. Pacer wire or   catheter noted in right atrium. - Atrial septum: No defect or patent foramen ovale was identified. - Tricuspid valve: There was trivial regurgitation. - Pulmonary arteries: PA peak pressure: 18 mm Hg (S). - Inferior vena cava: The vessel was normal in size. The   respirophasic diameter changes were in the normal range (= 50%),   consistent with normal central venous pressure.   ASSESSMENT AND PLAN:  1.  Paroxysmal atrial fibrillation S/p MAZE 11/2017. Patient's burden substantially increased over last 2 months with increased symptoms of SOB on exertion and fatigue.  Will resume amiodarone 200 mg BID for 4 weeks then reduce to 200 mg  daily. He has done well on this medication in the past. It was stopped as he was having no episodes of afib. Continue warfarin. INR therapeutic last two months. No room in BP for additional rate control. Check Bmet/TSH at f/u. Recent LFTs unremarkable.   This patients CHA2DS2-VASc Score and unadjusted Ischemic Stroke Rate (% per year) is equal to 7.2 % stroke rate/year from a score of 5  Above score calculated as 1 point each if present [CHF, HTN, DM, Vascular=MI/PAD/Aortic Plaque, Age if 65-74, or Male] Above score calculated as 2 points each if present [Age > 75, or Stroke/TIA/TE]  2. HTN BP low at times with home BP machine. Denies presyncope, dizziness, or falls. Will decrease lasix to 20 mg daily. Pt may take 40 mg if he notices increase edema/weight.   3. PPM Followed by device clinic and Dr Lovena Le.  4. Chronic systolic CHF No symptoms of fluid overload.  Continue BB, Entresto, Aldactone. Lasix decreased as above.  5. CAD No anginal symptoms. Continue present therapy.  6. OSA Encouraged compliance with CPAP therapy.   COVID screen The patient does not have any symptoms that suggest any further testing/ screening at this time.  Social distancing reinforced today.    Follow-up in AF clinic in one week.   Current medicines are reviewed at length with the patient today.   The patient does not have concerns regarding his medicines.  The following changes were made today:  Start amiodarone  Labs/ tests ordered today include:  No orders of the defined types were placed in this encounter.   Patient Risk:  after full review of this patients clinical status, I feel that they are at moderate risk at this time.   Today, I have spent 17 minutes with the patient with telehealth technology discussing atrial fibrillation, amiodarone, ER precautions, and COVID-19 precautions.    Gwenlyn Perking PA-C 05/08/2019 2:20 PM  Af[facility] [address] [place], [place] [address] [phone]   I hereby voluntarily request, consent and authorize  the Atrial Fibrillation Clinic and its employed or contracted physicians, physician assistants, nurse practitioners or other licensed health care professionals (the Practitioner), to provide me with telemedicine health care services (the "Services") as deemed necessary by the treating Practitioner. I acknowledge and consent to receive the Services by the Practitioner via telemedicine. I understand that the telemedicine visit will involve communicating with the Practitioner through live audiovisual communication technology and the disclosure of certain medical information by electronic transmission. I acknowledge that I have been given the opportunity to request an in-person assessment or other available alternative prior to the telemedicine visit and am voluntarily participating in the telemedicine visit.   I understand that I have the right to withhold or withdraw my consent to the use of telemedicine in the course of my care at any time, without affecting my right to future care or treatment, and that the Practitioner or I may terminate the telemedicine visit at any time. I understand that I have the right to inspect all information obtained and/or recorded in the course of the telemedicine visit and may receive copies of available information for a reasonable fee.  I understand that some of the potential risks of receiving the Services via telemedicine include:   Delay or interruption in medical evaluation due to technological equipment failure or disruption;  Information transmitted may not be sufficient (e.g. poor resolution of images) to allow for appropriate medical decision making by the Practitioner; and/or  In rare instances, security protocols could fail, causing a breach of personal health information.   Furthermore, I acknowledge that it is my responsibility to provide information about my medical  history, conditions and care that is complete and accurate to the best of my ability. I acknowledge that Practitioner's advice, recommendations, and/or decision may be based on factors not within their control, such as incomplete or inaccurate data provided by me or distortions of diagnostic images or specimens that may result from electronic transmissions. I understand that the practice of medicine is not an exact science and that Practitioner makes no warranties or guarantees regarding treatment outcomes. I acknowledge that I will receive a copy of this consent concurrently upon execution via email to the email address I last provided but may also request a printed copy by calling the office of the [facility].  I understand that my insurance will be billed for this visit.   I have read or had this consent read to me.  I understand the contents of this consent, which adequately explains the benefits and risks of the Services being provided via telemedicine.  I have been provided ample opportunity to ask questions regarding this consent and the Services and have had my questions answered to my satisfaction.  I give my informed consent for the services to be provided through the use of telemedicine in my medical care  By participating in this telemedicine visit I agree to the above.

## 2019-05-08 NOTE — Telephone Encounter (Signed)
CSW received notification from [employer] that pt copay grant is due to expire June 25th.  CSW called and discussed options for further assistance with pt wife.  Pt wife agreeable to placing patient on the [employer] waiting list to be notified when [place] reopens- pt signed up for waiting list.  CSW discussed alternatives to [employer].  CSW informed pt wife of [employer] program and requested clinic send patient a copy of application to complete.  Pt wife also requested that prescription be sent to their [place] to see if the copay would be any less.  CSW will continue to follow and assist as needed  Jorge Ny, [facility] Desk#: [phone] Cell#: [phone]

## 2019-05-16 ENCOUNTER — Telehealth: Payer: Self-pay

## 2019-05-16 ENCOUNTER — Encounter (HOSPITAL_COMMUNITY): Payer: Self-pay | Admitting: Physician Assistant

## 2019-05-16 ENCOUNTER — Other Ambulatory Visit: Payer: Self-pay

## 2019-05-16 ENCOUNTER — Ambulatory Visit (HOSPITAL_COMMUNITY)
Admission: RE | Admit: 2019-05-16 | Discharge: 2019-05-16 | Disposition: A | Discharge status: Home / Self Care | Payer: Medicare HMO | Source: Ambulatory Visit | Attending: Physician Assistant | Admitting: Physician Assistant

## 2019-05-16 VITALS — BP 106/78 | HR 99 | Ht 68.5 in

## 2019-05-16 DIAGNOSIS — D649 Anemia, unspecified: Secondary | ICD-10-CM | POA: Diagnosis not present

## 2019-05-16 DIAGNOSIS — I4819 Other persistent atrial fibrillation: Secondary | ICD-10-CM | POA: Insufficient documentation

## 2019-05-16 DIAGNOSIS — I252 Old myocardial infarction: Secondary | ICD-10-CM | POA: Insufficient documentation

## 2019-05-16 DIAGNOSIS — G4733 Obstructive sleep apnea (adult) (pediatric): Secondary | ICD-10-CM | POA: Insufficient documentation

## 2019-05-16 DIAGNOSIS — F329 Major depressive disorder, single episode, unspecified: Secondary | ICD-10-CM | POA: Insufficient documentation

## 2019-05-16 DIAGNOSIS — Z87891 Personal history of nicotine dependence: Secondary | ICD-10-CM | POA: Insufficient documentation

## 2019-05-16 DIAGNOSIS — R011 Cardiac murmur, unspecified: Secondary | ICD-10-CM | POA: Insufficient documentation

## 2019-05-16 DIAGNOSIS — Z952 Presence of prosthetic heart valve: Secondary | ICD-10-CM | POA: Diagnosis not present

## 2019-05-16 DIAGNOSIS — R7303 Prediabetes: Secondary | ICD-10-CM | POA: Insufficient documentation

## 2019-05-16 DIAGNOSIS — Z8673 Personal history of transient ischemic attack (TIA), and cerebral infarction without residual deficits: Secondary | ICD-10-CM | POA: Insufficient documentation

## 2019-05-16 DIAGNOSIS — K219 Gastro-esophageal reflux disease without esophagitis: Secondary | ICD-10-CM | POA: Diagnosis not present

## 2019-05-16 DIAGNOSIS — I5022 Chronic systolic (congestive) heart failure: Secondary | ICD-10-CM | POA: Diagnosis not present

## 2019-05-16 DIAGNOSIS — Z7901 Long term (current) use of anticoagulants: Secondary | ICD-10-CM | POA: Insufficient documentation

## 2019-05-16 DIAGNOSIS — Z79899 Other long term (current) drug therapy: Secondary | ICD-10-CM | POA: Diagnosis not present

## 2019-05-16 DIAGNOSIS — Z6841 Body Mass Index (BMI) 40.0 and over, adult: Secondary | ICD-10-CM | POA: Insufficient documentation

## 2019-05-16 DIAGNOSIS — Z951 Presence of aortocoronary bypass graft: Secondary | ICD-10-CM | POA: Insufficient documentation

## 2019-05-16 DIAGNOSIS — M1990 Unspecified osteoarthritis, unspecified site: Secondary | ICD-10-CM | POA: Diagnosis not present

## 2019-05-16 DIAGNOSIS — E669 Obesity, unspecified: Secondary | ICD-10-CM | POA: Diagnosis not present

## 2019-05-16 DIAGNOSIS — I110 Hypertensive heart disease with heart failure: Secondary | ICD-10-CM | POA: Diagnosis not present

## 2019-05-16 DIAGNOSIS — I484 Atypical atrial flutter: Secondary | ICD-10-CM | POA: Diagnosis not present

## 2019-05-16 DIAGNOSIS — I4892 Unspecified atrial flutter: Secondary | ICD-10-CM | POA: Diagnosis not present

## 2019-05-16 DIAGNOSIS — G8190 Hemiplegia, unspecified affecting unspecified side: Secondary | ICD-10-CM | POA: Insufficient documentation

## 2019-05-16 DIAGNOSIS — J439 Emphysema, unspecified: Secondary | ICD-10-CM | POA: Insufficient documentation

## 2019-05-16 NOTE — Progress Notes (Signed)
Primary Care Physician: Maurice Small, MD Primary Cardiologist: Dr Aundra Dubin Primary Electrophysiologist: Dr Lovena Le Referring Physician: Dr Berenice Bouton is a 73 y.o. male with a history of persistent atrial fibrillation/atrial flutter s/p MAZE 2018, CAD s/p CABG, HTN, s/p aortic and mitral valve replacements, COPD, OSA who presents for follow up in the [facility]. Patient reports that he feels "OK" but his wife reports that she has noticed more SOB and fatigue when he is active. Remote device transmission shows substantially increased AF burden since March 2020. Recently resumed his amiodarone. He appears to be in atrial flutter today.   Today, he denies symptoms of palpitations, chest pain, orthopnea, PND, lower extremity edema, dizziness, presyncope, syncope, snoring, daytime somnolence, bleeding, or neurologic sequela. The patient is tolerating medications without difficulties and is otherwise without complaint today.    Atrial Fibrillation Risk Factors:  he does have symptoms or diagnosis of sleep apnea. he is not compliant with CPAP therapy.  he has a BMI of Body mass index is 40.01 kg/m..  BP 106/78 Pulse 99  Family History  Problem Relation Age of Onset  . Stroke Father        No details  . Angina Mother      Atrial Fibrillation Management history:  Previous antiarrhythmic drugs: amiodarone Previous cardioversions: 04/2018 Previous ablations: MAZE CHADS2VASC score: 5 Anticoagulation history: warfarin   Past Medical History:  Diagnosis Date  . Anemia 07/13/2017  . Anxiety   . Aortic insufficiency   . Aortic stenosis, moderate 07/13/2017  . Arthritis    back   . Carotid stenosis    Right carotid stent (widely patent) 40 - 59% left plaque 11/13  . COPD (chronic obstructive pulmonary disease) ([place])   . Coronary artery disease involving native coronary artery of native heart without angina pectoris   . Depression   .  Dyslipidemia   . GERD (gastroesophageal reflux disease)   . Heart murmur   . Hemiplegia affecting unspecified side, late effect of cerebrovascular disease    resolved- from L side   . Hypertension   . Jaundice    resolved following ERCP & Cholecystectomy  . Mild emphysema ([place])   . Mitral regurgitation   . Mitral valve insufficiency and aortic valve insufficiency   . Myocardial infarction ([place])   . Paroxysmal atrial fibrillation ([facility])   . Pre-diabetes    per spouse  . S/P aortic valve replacement with bioprosthetic valve 11/16/2017   23 mm [facility] Ease stented bovine pericardial tissue valve  . S/P CABG x 1 11/16/2017   SVG to PDA with EVH via right thigh  . S/P Maze operation for atrial fibrillation 11/16/2017   Left side lesion set using bipolar radiofrequency and cryothermy ablation with clipping of LA appendage  . S/P mitral valve replacement with bioprosthetic valve 11/16/2017   27 mm [facility] Mitral stented bovine pericardial tissue valve  . Sleep apnea    does not wear CPAP  . Sleep concern    resulted in surgery- after + sleep test. Pt. doesn't have a problem any longer.   . Stroke ([place]) 03/11/2003   stent placed on the 31, 3, 2004, L side   . Wears glasses   . Wears hearing aid in both ears   . Wears partial dentures    Past Surgical History:  Procedure Laterality Date  . AORTIC VALVE REPLACEMENT N/A 11/16/2017   Procedure: AORTIC VALVE REPLACEMENT (AVR) with 83mm Magna Ease;  Surgeon: Rexene Alberts, MD;  Location: [place];  Service: Open Heart Surgery;  Laterality: N/A;  . ARTERIAL LINE INSERTION Right 11/16/2017   Procedure: ARTERIAL LINE INSERTION -RIGHT FEMORAL;  Surgeon: Rexene Alberts, MD;  Location: [place];  Service: Open Heart Surgery;  Laterality: Right;  . BACK SURGERY     lumbar back  . CARDIOVERSION N/A 04/11/2018   Procedure: CARDIOVERSION;  Surgeon: Larey Dresser, MD;  Location: [facility] ENDOSCOPY;  Service: Cardiovascular;  Laterality: N/A;  .  CHOLECYSTECTOMY    . CORONARY ARTERY BYPASS GRAFT N/A 11/16/2017   Procedure: CORONARY ARTERY BYPASS GRAFTING (CABG) x 1;  Surgeon: Rexene Alberts, MD;  Location: [place];  Service: Open Heart Surgery;  Laterality: N/A;  . ENDOVEIN HARVEST OF GREATER SAPHENOUS VEIN Right 11/16/2017   Procedure: ENDOVEIN HARVEST OF GREATER SAPHENOUS VEIN;  Surgeon: Rexene Alberts, MD;  Location: [place];  Service: Open Heart Surgery;  Laterality: Right;  . ERCP N/A 05/31/2013   Procedure: ENDOSCOPIC RETROGRADE CHOLANGIOPANCREATOGRAPHY (ERCP);  Surgeon: Ladene Artist, MD;  Location: [facility];  Service: Endoscopy;  Laterality: N/A;  . FOOT SURGERY     right  . IR RADIOLOGY PERIPHERAL GUIDED IV START  09/28/2017  . IR US GUIDE VASC ACCESS RIGHT  09/28/2017  . LAPAROSCOPIC CHOLECYSTECTOMY SINGLE PORT N/A 06/01/2013   Procedure: LAPAROSCOPIC CHOLECYSTECTOMY SINGLE PORT;  Surgeon: Adin Hector, MD;  Location: [facility];  Service: General;  Laterality: N/A;  . MAZE N/A 11/16/2017   Procedure: MAZE;  Surgeon: Rexene Alberts, MD;  Location: [place];  Service: Open Heart Surgery;  Laterality: N/A;  . MITRAL VALVE REPAIR N/A 11/16/2017   Procedure: MITRAL VALVE  REPLACEMENT with 24mm MagnaEase;  Surgeon: Rexene Alberts, MD;  Location: [place];  Service: Open Heart Surgery;  Laterality: N/A;  . PACEMAKER IMPLANT N/A 11/21/2017   Procedure: PACEMAKER IMPLANT;  Surgeon: Evans Lance, MD;  Location: [place] CV LAB;  Service: Cardiovascular;  Laterality: N/A;  . POLYPECTOMY    . RIGHT/LEFT HEART CATH AND CORONARY ANGIOGRAPHY N/A 08/30/2017   Procedure: RIGHT/LEFT HEART CATH AND CORONARY ANGIOGRAPHY;  Surgeon: Larey Dresser, MD;  Location: [place] [facility] LAB;  Service: Cardiovascular;  Laterality: N/A;  . SHOULDER ARTHROSCOPY WITH ROTATOR CUFF REPAIR AND SUBACROMIAL DECOMPRESSION Left 05/18/2017  . SHOULDER ARTHROSCOPY WITH ROTATOR CUFF REPAIR AND SUBACROMIAL DECOMPRESSION Left 05/18/2017   Procedure: SHOULDER  ARTHROSCOPY WITH ROTATOR CUFF REPAIR AND SUBACROMIAL DECOMPRESSION;  Surgeon: Tania Ade, MD;  Location: [place];  Service: Orthopedics;  Laterality: Left;  LEFT SHOULDER ARTHROSCOPY WITH ROTATOR CUFF REPAIR AND SUBACROMIAL DECOMPRESSION  . TEE WITHOUT CARDIOVERSION N/A 05/09/2017   Procedure: TRANSESOPHAGEAL ECHOCARDIOGRAM (TEE);  Surgeon: Skeet Latch, MD;  Location: [place];  Service: Cardiovascular;  Laterality: N/A;  . TEE WITHOUT CARDIOVERSION N/A 08/30/2017   Procedure: TRANSESOPHAGEAL ECHOCARDIOGRAM (TEE);  Surgeon: Larey Dresser, MD;  Location: [facility] ENDOSCOPY;  Service: Cardiovascular;  Laterality: N/A;  . TEE WITHOUT CARDIOVERSION N/A 11/16/2017   Procedure: TRANSESOPHAGEAL ECHOCARDIOGRAM (TEE);  Surgeon: Rexene Alberts, MD;  Location: [place];  Service: Open Heart Surgery;  Laterality: N/A;  . TEE WITHOUT CARDIOVERSION N/A 04/11/2018   Procedure: TRANSESOPHAGEAL ECHOCARDIOGRAM (TEE);  Surgeon: Larey Dresser, MD;  Location: [facility] ENDOSCOPY;  Service: Cardiovascular;  Laterality: N/A;  . TONSILLECTOMY      Current Outpatient Medications  Medication Sig Dispense Refill  . acetaminophen (TYLENOL) 325 MG tablet Take 1 tablet (325 mg total) by mouth every 6 (  six) hours as needed for mild pain. 30 tablet 0  . amiodarone (PACERONE) 200 MG tablet Take 1 tablet twice a day for 1 month then reduce to 1 tablet daily 90 tablet 3  . calcium carbonate (TUMS - DOSED IN MG ELEMENTAL CALCIUM) 500 MG chewable tablet Chew 1 tablet by mouth daily as needed for indigestion or heartburn.    . carvedilol (COREG) 6.25 MG tablet Take 2 tablets (12.5 mg) by mouth 2 (two) times daily. 360 tablet 3  . cetirizine (ZYRTEC) 10 MG tablet Take 10 mg by mouth daily as needed for allergies.    . citalopram (CELEXA) 20 MG tablet Take 1 tablet (20 mg total) by mouth at bedtime. 30 tablet 5  . docusate sodium (COLACE) 100 MG capsule Take 1 capsule (100 mg total) by mouth 3 (three) times daily as needed. 20 capsule  0  . furosemide (LASIX) 40 MG tablet Take 0.5 tablets (20 mg total) by mouth daily. May take extra 1/2 tablet as needed for weight gain/swelling 90 tablet 6  . ipratropium (ATROVENT) 0.03 % nasal spray Place 2 sprays into both nostrils 2 (two) times daily.     . methocarbamol (ROBAXIN) 500 MG tablet Take 1 tablet (500 mg total) by mouth every 8 (eight) hours as needed for muscle spasms. 30 tablet 0  . pantoprazole (PROTONIX) 40 MG tablet Take 1 tablet (40 mg total) by mouth daily. 30 tablet 1  . Pseudoeph-Doxylamine-DM-APAP (NYQUIL PO) Take by mouth as needed.    . rosuvastatin (CRESTOR) 10 MG tablet Take 1 tablet (10 mg total) by mouth daily. 90 tablet 1  . sacubitril-valsartan (ENTRESTO) 24-26 MG Take 1 tablet by mouth 2 (two) times daily. 180 tablet 3  . spironolactone (ALDACTONE) 25 MG tablet Take 1 tablet (25 mg total) by mouth daily. 90 tablet 1  . traZODone (DESYREL) 50 MG tablet Take 100 mg by mouth at bedtime.     [name] warfarin (COUMADIN) 5 MG tablet TAKE 1 TABLET BY MOUTH AS DIRECTED BY COUMADIN CLINIC (Patient taking differently: TAKE 1 TABLET BY MOUTH AS DIRECTED BY COUMADIN CLINIC-extra 5 mg every Wednesday) 90 tablet 1   No current facility-administered medications for this encounter.     Allergies  Allergen Reactions  . Bee Venom Anaphylaxis and Swelling    Social History   Socioeconomic History  . Marital status: Married    Spouse name: Not on file  . Number of children: 3  . Years of education: Not on file  . Highest education level: Not on file  Occupational History    Employer: RETIRED  Social Needs  . Financial resource strain: Not on file  . Food insecurity:    Worry: Not on file    Inability: Not on file  . Transportation needs:    Medical: Not on file    Non-medical: Not on file  Tobacco Use  . Smoking status: Former Smoker    Packs/day: 0.50    Years: 57.00    Pack years: 28.50    Types: Cigarettes    Last attempt to quit: 07/13/1997    Years since  quitting: 21.8  . Smokeless tobacco: Never Used  Substance and Sexual Activity  . Alcohol use: Yes    Alcohol/week: 2.0 - 3.0 standard drinks    Types: 2 - 3 Glasses of wine per week    Comment: moderate wine ; not since 8/ 2018  . Drug use: No  . Sexual activity: Yes  Lifestyle  . Physical  activity:    Days per week: Not on file    Minutes per session: Not on file  . Stress: Not on file  Relationships  . Social connections:    Talks on phone: Not on file    Gets together: Not on file    Attends religious service: Not on file    Active member of club or organization: Not on file    Attends meetings of clubs or organizations: Not on file    Relationship status: Not on file  . Intimate partner violence:    Fear of current or ex partner: Not on file    Emotionally abused: Not on file    Physically abused: Not on file    Forced sexual activity: Not on file  Other Topics Concern  . Not on file  Social History Narrative   Two living children.  Lives with wife.       ROS- All systems are reviewed and negative except as per the HPI above.  Physical Exam: Vitals:   05/16/19 1033  BP: 106/78  Pulse: 99  Height: 5' 8.5" (1.74 m)    GEN- The patient is well appearing elederly male, alert and oriented x 3 today.   Head- normocephalic, atraumatic Eyes-  Sclera clear, conjunctiva pink Ears- hearing intact Oropharynx- clear Neck- supple  Lungs- Clear to ausculation bilaterally, normal work of breathing Heart- Regular rate and rhythm, no murmurs, rubs or gallops  GI- soft, NT, ND, + BS Extremities- no clubbing, cyanosis, or edema MS- no significant deformity or atrophy Skin- no rash or lesion Psych- euthymic mood, full affect Neuro- strength and sensation are intact  Wt Readings from Last 3 Encounters:  05/08/19 121.1 kg  01/29/19 121.2 kg  11/28/18 121.1 kg    EKG today demonstrates atypical atrial flutter, HR 99, slow R wave prog, QRS 124, QTc 521  Echo 11/2018  demonstrated - Left ventricle: The cavity size was normal. Wall thickness was increased in a pattern of moderate LVH. Systolic function was normal. The estimated ejection fraction was in the range of 50% to 55%. Septal hypokinesis. The study is not technically sufficient to allow evaluation of LV diastolic function. - Aortic valve: s/p Bioprosthetic AVR - no obstruction. Mean gradient (S): 10 mm Hg. Peak gradient (S): 21 mm Hg. Valve area (VTI): 1.59 cm^2. Valve area (Vmax): 1.42 cm^2. Valve area (Vmean): 1.52 cm^2. - Mitral valve: S/p bioprosthetic MVR - no obstruction. Valve area by pressure half-time: 2.12 cm^2. Valve area by continuity equation (using LVOT flow): 1.76 cm^2. - Left atrium: The atrium was at the upper limits of normal in size. - Right ventricle: Pacer wire or catheter noted in right ventricle. - Right atrium: The atrium was mildly dilated. Pacer wire or catheter noted in right atrium. - Atrial septum: No defect or patent foramen ovale was identified. - Tricuspid valve: There was trivial regurgitation. - Pulmonary arteries: PA peak pressure: 18 mm Hg (S). - Inferior vena cava: The vessel was normal in size. The respirophasic diameter changes were in the normal range (= 50%), consistent with normal central venous pressure.  Epic records are reviewed at length today  Assessment and Plan:  1. Persistent atrial fibrillation/atrial flutter Patient appears to be in persistent atrial flutter with increase SOB and fatigue. Will continue amiodarone 200 mg BID Check weekly INR. If he is still in atrial flutter on f/u, will pursue DCCV. Continue warfarin   This patients CHA2DS2-VASc Score and unadjusted Ischemic Stroke Rate (% per year)  is equal to 7.2 % stroke rate/year from a score of 5  Above score calculated as 1 point each if present [CHF, HTN, DM, Vascular=MI/PAD/Aortic Plaque, Age if 65-74, or Male] Above score calculated as 2 points  each if present [Age > 75, or Stroke/TIA/TE]   2. Obesity Body mass index is 40.01 kg/m. Lifestyle modification was discussed at length including regular exercise and weight reduction.  3. HTN Stable, no change today.  4. PPM Followed by device clinic and Dr Lovena Le.  5. Chronic systolic CHF Weight stable, no signs of fluid overload. Continue present therapy.  6. CAD No anginal symptoms. Continue present therapy.   7. OSA Encourage compliance with CPAP.  Follow up in AF clinic in 3 weeks.   [facility] [address] [place], [place] [address] [phone] 05/16/2019 1:12 PM

## 2019-05-16 NOTE — Telephone Encounter (Signed)
Spoke with pt's wife, per pt request he is HOH.  Scheduled weekly INR checks in office per AF clinic request.  Pt is pending DCCV. Appt made for tomorrow 05/17/19 at 11:45am. 1. COVID-19 Pre-Screening Questions:  . In the past 7 to 10 days have you had a cough,  shortness of breath, headache, congestion, fever (100 or greater) body aches, chills, sore throat, or sudden loss of taste or sense of smell?  No . Have you been around anyone with known Covid 19.  No . Have you been around anyone who is awaiting Covid 19 test results in the past 7 to 10 days?  No . Have you been around anyone who has been exposed to Covid 19, or has mentioned symptoms of Covid 19 within the past 7 to 10 days?  No    2. Pt advised of visitor restrictions (no visitors allowed except if needed to conduct the visit). Also advised to arrive at appointment time and wear a mask.

## 2019-05-16 NOTE — Progress Notes (Signed)
Pt in for EKG since starting Amiodarone 200 mg bid;  To be reviewed by Adline Peals, PA.  Pt also had to resume 40 mg of furosemide on Tuesday 05/14/2019

## 2019-05-16 NOTE — Telephone Encounter (Signed)
-----   Message from Iona Hansen, [place] sent at 05/16/2019  1:48 PM EDT ----- Candace Cruise, I apologize!  It would be helpful if I gave you a name.  I attached his chart  ----- Message ----- From: Erskine Emery, [facility] Sent: 05/16/2019  11:38 AM EDT To: Iona Hansen, CMA, Leeroy Bock, RPH, #  Ileene Musa - I don't see a patient attached here - can you please send the patient name and DOB?  Thanks!! Georgina Peer  ----- Message ----- From: Iona Hansen, CMA Sent: 05/16/2019  10:55 AM EDT To: Erskine Emery, RPH, Leeroy Bock, RPH, #  Pt needs 4 weekly INRs to be started this week for upcoming dccv.  Can you please contact patient to schedule these checks?    Thank you   Concepcion Living, CMA

## 2019-05-16 NOTE — Patient Instructions (Signed)
I have sent a note to coumadin clinic to get you scheduled for 4 weekly INRs.  You will have an INR checked here when you follow up with Ricky at the end of the month.    If you have any questions, please call us

## 2019-05-17 ENCOUNTER — Ambulatory Visit (INDEPENDENT_AMBULATORY_CARE_PROVIDER_SITE_OTHER): Payer: Medicare HMO | Admitting: *Deleted

## 2019-05-17 ENCOUNTER — Other Ambulatory Visit: Payer: Self-pay

## 2019-05-17 DIAGNOSIS — Z9889 Other specified postprocedural states: Secondary | ICD-10-CM | POA: Diagnosis not present

## 2019-05-17 DIAGNOSIS — Z8679 Personal history of other diseases of the circulatory system: Secondary | ICD-10-CM | POA: Diagnosis not present

## 2019-05-17 DIAGNOSIS — Z5181 Encounter for therapeutic drug level monitoring: Secondary | ICD-10-CM

## 2019-05-17 DIAGNOSIS — Z953 Presence of xenogenic heart valve: Secondary | ICD-10-CM | POA: Diagnosis not present

## 2019-05-17 DIAGNOSIS — I480 Paroxysmal atrial fibrillation: Secondary | ICD-10-CM | POA: Diagnosis not present

## 2019-05-17 DIAGNOSIS — Z951 Presence of aortocoronary bypass graft: Secondary | ICD-10-CM

## 2019-05-17 LAB — POCT INR: INR: 3.6 — AB (ref 2.0–3.0)

## 2019-05-17 NOTE — Patient Instructions (Addendum)
Description   Spoke with pt and instructed him to hold tonight's dose and then continue taking 1 tablet daily except 1.5 tablets on Wednesdays. Recheck INR 05/23/2019, Call with any new medications scheduled procedures or any questions  [phone]

## 2019-05-23 ENCOUNTER — Telehealth: Payer: Self-pay

## 2019-05-23 ENCOUNTER — Other Ambulatory Visit: Payer: Self-pay

## 2019-05-23 ENCOUNTER — Ambulatory Visit (INDEPENDENT_AMBULATORY_CARE_PROVIDER_SITE_OTHER): Payer: Medicare HMO | Admitting: *Deleted

## 2019-05-23 DIAGNOSIS — Z8679 Personal history of other diseases of the circulatory system: Secondary | ICD-10-CM | POA: Diagnosis not present

## 2019-05-23 DIAGNOSIS — Z951 Presence of aortocoronary bypass graft: Secondary | ICD-10-CM

## 2019-05-23 DIAGNOSIS — Z953 Presence of xenogenic heart valve: Secondary | ICD-10-CM | POA: Diagnosis not present

## 2019-05-23 DIAGNOSIS — Z5181 Encounter for therapeutic drug level monitoring: Secondary | ICD-10-CM | POA: Diagnosis not present

## 2019-05-23 DIAGNOSIS — Z9889 Other specified postprocedural states: Secondary | ICD-10-CM

## 2019-05-23 LAB — POCT INR: INR: 2.9 (ref 2.0–3.0)

## 2019-05-23 NOTE — Telephone Encounter (Signed)
lmom for prescreen  

## 2019-05-23 NOTE — Patient Instructions (Signed)
Description    Continue taking 1 tablet daily except 1.5 tablets on Wednesdays. Recheck INR 05/29/2019, Call with any new medications scheduled procedures or any questions  [phone]

## 2019-05-27 MED FILL — ENTRESTO 24 MG-26 MG TABLET: 24-26 | 30 days supply | Qty: 60 | Fill #5

## 2019-05-29 ENCOUNTER — Other Ambulatory Visit (HOSPITAL_COMMUNITY): Payer: Self-pay | Admitting: *Deleted

## 2019-05-29 ENCOUNTER — Other Ambulatory Visit: Payer: Self-pay

## 2019-05-29 ENCOUNTER — Ambulatory Visit (INDEPENDENT_AMBULATORY_CARE_PROVIDER_SITE_OTHER): Payer: Medicare HMO | Admitting: *Deleted

## 2019-05-29 DIAGNOSIS — I480 Paroxysmal atrial fibrillation: Secondary | ICD-10-CM

## 2019-05-29 DIAGNOSIS — Z951 Presence of aortocoronary bypass graft: Secondary | ICD-10-CM | POA: Diagnosis not present

## 2019-05-29 DIAGNOSIS — Z953 Presence of xenogenic heart valve: Secondary | ICD-10-CM

## 2019-05-29 DIAGNOSIS — Z9889 Other specified postprocedural states: Secondary | ICD-10-CM | POA: Diagnosis not present

## 2019-05-29 DIAGNOSIS — Z5181 Encounter for therapeutic drug level monitoring: Secondary | ICD-10-CM

## 2019-05-29 DIAGNOSIS — Z8679 Personal history of other diseases of the circulatory system: Secondary | ICD-10-CM | POA: Diagnosis not present

## 2019-05-29 LAB — POCT INR: INR: 3.4 — AB (ref 2.0–3.0)

## 2019-05-29 MED ORDER — AMIODARONE HCL 200 MG PO TABS: 200.0000 mg | ORAL_TABLET | Freq: Every day | ORAL | 2 refills | Status: DC

## 2019-05-29 NOTE — Patient Instructions (Signed)
Description     Hold today then continue taking 1 tablet daily except 1.5 tablets on Wednesdays. Recheck INR 06/04/2019, Call with any new medications scheduled procedures or any questions  [phone]

## 2019-06-04 ENCOUNTER — Telehealth: Payer: Self-pay

## 2019-06-04 ENCOUNTER — Ambulatory Visit (INDEPENDENT_AMBULATORY_CARE_PROVIDER_SITE_OTHER): Payer: Medicare HMO | Admitting: Pharmacist

## 2019-06-04 ENCOUNTER — Other Ambulatory Visit: Payer: Self-pay

## 2019-06-04 DIAGNOSIS — Z8679 Personal history of other diseases of the circulatory system: Secondary | ICD-10-CM | POA: Diagnosis not present

## 2019-06-04 DIAGNOSIS — Z9889 Other specified postprocedural states: Secondary | ICD-10-CM

## 2019-06-04 DIAGNOSIS — Z5181 Encounter for therapeutic drug level monitoring: Secondary | ICD-10-CM | POA: Diagnosis not present

## 2019-06-04 DIAGNOSIS — Z953 Presence of xenogenic heart valve: Secondary | ICD-10-CM | POA: Diagnosis not present

## 2019-06-04 DIAGNOSIS — Z951 Presence of aortocoronary bypass graft: Secondary | ICD-10-CM

## 2019-06-04 DIAGNOSIS — I480 Paroxysmal atrial fibrillation: Secondary | ICD-10-CM

## 2019-06-04 LAB — POCT INR: INR: 2.6 (ref 2.0–3.0)

## 2019-06-04 MED ORDER — WARFARIN SODIUM 5 MG PO TABS: ORAL_TABLET | ORAL | 1 refills | Status: DC

## 2019-06-04 NOTE — Telephone Encounter (Signed)

## 2019-06-04 NOTE — Telephone Encounter (Signed)
Pt wife states that their Internet goes in and out where they live and that is why they send a manual transmission once a week. I told her I do understand. I also told her to just send the transmission on the day it supposed to be send and when someone call her for a manual transmission. Pt wife verbalized understanding.

## 2019-06-04 NOTE — Patient Instructions (Signed)
Description   Continue taking 1 tablet daily except 1.5 tablets on Wednesdays. Recheck INR in 1 week, pending cardioversion. Call with any new medications scheduled procedures or any questions  [phone]

## 2019-06-05 ENCOUNTER — Ambulatory Visit (INDEPENDENT_AMBULATORY_CARE_PROVIDER_SITE_OTHER): Payer: Medicare HMO | Admitting: *Deleted

## 2019-06-05 DIAGNOSIS — I442 Atrioventricular block, complete: Secondary | ICD-10-CM

## 2019-06-05 LAB — CUP PACEART REMOTE DEVICE CHECK
Battery Remaining Longevity: 135 mo
Battery Voltage: 3.03 V
Brady Statistic AP VP Percent: 0.08 %
Brady Statistic AP VS Percent: 89.03 %
Brady Statistic AS VP Percent: 0.04 %
Brady Statistic AS VS Percent: 10.84 %
Brady Statistic RA Percent Paced: 89.19 %
Brady Statistic RV Percent Paced: 0.12 %
Date Time Interrogation Session: 20200624052727
Implantable Lead Implant Date: 20181211
Implantable Lead Implant Date: 20181211
Implantable Lead Location: 753859
Implantable Lead Location: 753860
Implantable Lead Model: 3830
Implantable Lead Model: 5076
Implantable Pulse Generator Implant Date: 20181211
Lead Channel Impedance Value: 285 Ohm
Lead Channel Impedance Value: 323 Ohm
Lead Channel Impedance Value: 399 Ohm
Lead Channel Impedance Value: 418 Ohm
Lead Channel Pacing Threshold Amplitude: 0.625 V
Lead Channel Pacing Threshold Amplitude: 1.25 V
Lead Channel Pacing Threshold Pulse Width: 0.4 ms
Lead Channel Pacing Threshold Pulse Width: 0.4 ms
Lead Channel Sensing Intrinsic Amplitude: 4.125 mV
Lead Channel Sensing Intrinsic Amplitude: 4.125 mV
Lead Channel Sensing Intrinsic Amplitude: 4.5 mV
Lead Channel Sensing Intrinsic Amplitude: 4.5 mV
Lead Channel Setting Pacing Amplitude: 2 V
Lead Channel Setting Pacing Amplitude: 2.5 V
Lead Channel Setting Pacing Pulse Width: 1 ms
Lead Channel Setting Sensing Sensitivity: 1.2 mV

## 2019-06-06 ENCOUNTER — Other Ambulatory Visit: Payer: Self-pay

## 2019-06-06 ENCOUNTER — Telehealth: Payer: Self-pay | Admitting: *Deleted

## 2019-06-06 ENCOUNTER — Ambulatory Visit (HOSPITAL_COMMUNITY)
Admission: RE | Admit: 2019-06-06 | Discharge: 2019-06-06 | Disposition: A | Discharge status: Home / Self Care | Payer: Medicare HMO | Source: Ambulatory Visit | Attending: Physician Assistant | Admitting: Physician Assistant

## 2019-06-06 VITALS — BP 132/74 | HR 60 | Ht 68.5 in | Wt 272.0 lb

## 2019-06-06 DIAGNOSIS — G4733 Obstructive sleep apnea (adult) (pediatric): Secondary | ICD-10-CM | POA: Diagnosis not present

## 2019-06-06 DIAGNOSIS — I484 Atypical atrial flutter: Secondary | ICD-10-CM

## 2019-06-06 DIAGNOSIS — D649 Anemia, unspecified: Secondary | ICD-10-CM | POA: Diagnosis not present

## 2019-06-06 DIAGNOSIS — I252 Old myocardial infarction: Secondary | ICD-10-CM | POA: Diagnosis not present

## 2019-06-06 DIAGNOSIS — Z7901 Long term (current) use of anticoagulants: Secondary | ICD-10-CM | POA: Insufficient documentation

## 2019-06-06 DIAGNOSIS — M1990 Unspecified osteoarthritis, unspecified site: Secondary | ICD-10-CM | POA: Insufficient documentation

## 2019-06-06 DIAGNOSIS — R7303 Prediabetes: Secondary | ICD-10-CM | POA: Insufficient documentation

## 2019-06-06 DIAGNOSIS — Z951 Presence of aortocoronary bypass graft: Secondary | ICD-10-CM | POA: Insufficient documentation

## 2019-06-06 DIAGNOSIS — I340 Nonrheumatic mitral (valve) insufficiency: Secondary | ICD-10-CM | POA: Diagnosis not present

## 2019-06-06 DIAGNOSIS — Z87891 Personal history of nicotine dependence: Secondary | ICD-10-CM | POA: Insufficient documentation

## 2019-06-06 DIAGNOSIS — Z79899 Other long term (current) drug therapy: Secondary | ICD-10-CM | POA: Diagnosis not present

## 2019-06-06 DIAGNOSIS — I110 Hypertensive heart disease with heart failure: Secondary | ICD-10-CM | POA: Insufficient documentation

## 2019-06-06 DIAGNOSIS — E785 Hyperlipidemia, unspecified: Secondary | ICD-10-CM | POA: Insufficient documentation

## 2019-06-06 DIAGNOSIS — I5022 Chronic systolic (congestive) heart failure: Secondary | ICD-10-CM | POA: Diagnosis not present

## 2019-06-06 DIAGNOSIS — R011 Cardiac murmur, unspecified: Secondary | ICD-10-CM | POA: Diagnosis not present

## 2019-06-06 DIAGNOSIS — I4819 Other persistent atrial fibrillation: Secondary | ICD-10-CM | POA: Diagnosis not present

## 2019-06-06 DIAGNOSIS — I2510 Atherosclerotic heart disease of native coronary artery without angina pectoris: Secondary | ICD-10-CM | POA: Insufficient documentation

## 2019-06-06 DIAGNOSIS — J439 Emphysema, unspecified: Secondary | ICD-10-CM | POA: Insufficient documentation

## 2019-06-06 DIAGNOSIS — K219 Gastro-esophageal reflux disease without esophagitis: Secondary | ICD-10-CM | POA: Diagnosis not present

## 2019-06-06 DIAGNOSIS — I4892 Unspecified atrial flutter: Secondary | ICD-10-CM | POA: Insufficient documentation

## 2019-06-06 DIAGNOSIS — Z8673 Personal history of transient ischemic attack (TIA), and cerebral infarction without residual deficits: Secondary | ICD-10-CM | POA: Insufficient documentation

## 2019-06-06 DIAGNOSIS — Z952 Presence of prosthetic heart valve: Secondary | ICD-10-CM | POA: Insufficient documentation

## 2019-06-06 NOTE — Telephone Encounter (Signed)
Wife called to inform us that Ricky at the [facility] canceled her husbands DCCV and so he does not need the weekly checks. She states no med changes at this time and would like to continue on 4 weeks checks and appt set and she will call us back if anything changes.

## 2019-06-06 NOTE — Progress Notes (Signed)
Primary Care Physician: Maurice Small, MD Primary Cardiologist: Dr Aundra Dubin Primary Electrophysiologist: Dr Lovena Le Referring Physician: Dr Berenice Bouton is a 73 y.o. male with a history of persistent atrial fibrillation/atrial flutter s/p MAZE 2018, CAD s/p CABG, HTN, s/p aortic and mitral valve replacements, COPD, OSA who presents for follow up in the [facility]. Patient reports that he feels "OK" but his wife reports that she has noticed more SOB and fatigue when he is active. Remote device transmission shows substantially increased AF burden since March 2020. Recently resumed his amiodarone.   On follow up today, patient reports that he is feeling better with more energy. His wife reports that he is able to walk all the way down the driveway and back which he could not do previously. On remote device transmission it appears that his afib burden has substantially decreased with resuming amiodarone. His in in A paced rhythm today.    Today, he denies symptoms of palpitations, chest pain, orthopnea, PND, lower extremity edema, dizziness, presyncope, syncope, snoring, daytime somnolence, bleeding, or neurologic sequela. The patient is tolerating medications without difficulties and is otherwise without complaint today.    Atrial Fibrillation Risk Factors:  he does have symptoms or diagnosis of sleep apnea. he is not compliant with CPAP therapy.  he has a BMI of Body mass index is 40.76 kg/m.[name]  Blood pressure 132/74, pulse 60, height 5' 8.5" (1.74 m), weight 123.4 kg.  Family History  Problem Relation Age of Onset  . Stroke Father        No details  . Angina Mother      Atrial Fibrillation Management history:  Previous antiarrhythmic drugs: amiodarone Previous cardioversions: 04/2018 Previous ablations: MAZE CHADS2VASC score: 5 Anticoagulation history: warfarin   Past Medical History:  Diagnosis Date  . Anemia 07/13/2017  . Anxiety   .  Aortic insufficiency   . Aortic stenosis, moderate 07/13/2017  . Arthritis    back   . Carotid stenosis    Right carotid stent (widely patent) 40 - 59% left plaque 11/13  . COPD (chronic obstructive pulmonary disease) ([place])   . Coronary artery disease involving native coronary artery of native heart without angina pectoris   . Depression   . Dyslipidemia   . GERD (gastroesophageal reflux disease)   . Heart murmur   . Hemiplegia affecting unspecified side, late effect of cerebrovascular disease    resolved- from L side   . Hypertension   . Jaundice    resolved following ERCP & Cholecystectomy  . Mild emphysema ([place])   . Mitral regurgitation   . Mitral valve insufficiency and aortic valve insufficiency   . Myocardial infarction ([place])   . Paroxysmal atrial fibrillation ([facility])   . Pre-diabetes    per spouse  . S/P aortic valve replacement with bioprosthetic valve 11/16/2017   23 mm [facility] Ease stented bovine pericardial tissue valve  . S/P CABG x 1 11/16/2017   SVG to PDA with EVH via right thigh  . S/P Maze operation for atrial fibrillation 11/16/2017   Left side lesion set using bipolar radiofrequency and cryothermy ablation with clipping of LA appendage  . S/P mitral valve replacement with bioprosthetic valve 11/16/2017   27 mm [facility] Mitral stented bovine pericardial tissue valve  . Sleep apnea    does not wear CPAP  . Sleep concern    resulted in surgery- after + sleep test. Pt. doesn't have a problem any longer.   [name]  Stroke ([place]) 03/11/2003   stent placed on the 31, 3, 2004, L side   . Wears glasses   . Wears hearing aid in both ears   . Wears partial dentures    Past Surgical History:  Procedure Laterality Date  . AORTIC VALVE REPLACEMENT N/A 11/16/2017   Procedure: AORTIC VALVE REPLACEMENT (AVR) with 5mm Magna Ease;  Surgeon: Rexene Alberts, MD;  Location: [place];  Service: Open Heart Surgery;  Laterality: N/A;  . ARTERIAL LINE INSERTION Right 11/16/2017    Procedure: ARTERIAL LINE INSERTION -RIGHT FEMORAL;  Surgeon: Rexene Alberts, MD;  Location: [place];  Service: Open Heart Surgery;  Laterality: Right;  . BACK SURGERY     lumbar back  . CARDIOVERSION N/A 04/11/2018   Procedure: CARDIOVERSION;  Surgeon: Larey Dresser, MD;  Location: [facility] ENDOSCOPY;  Service: Cardiovascular;  Laterality: N/A;  . CHOLECYSTECTOMY    . CORONARY ARTERY BYPASS GRAFT N/A 11/16/2017   Procedure: CORONARY ARTERY BYPASS GRAFTING (CABG) x 1;  Surgeon: Rexene Alberts, MD;  Location: [place];  Service: Open Heart Surgery;  Laterality: N/A;  . ENDOVEIN HARVEST OF GREATER SAPHENOUS VEIN Right 11/16/2017   Procedure: ENDOVEIN HARVEST OF GREATER SAPHENOUS VEIN;  Surgeon: Rexene Alberts, MD;  Location: [place];  Service: Open Heart Surgery;  Laterality: Right;  . ERCP N/A 05/31/2013   Procedure: ENDOSCOPIC RETROGRADE CHOLANGIOPANCREATOGRAPHY (ERCP);  Surgeon: Ladene Artist, MD;  Location: [facility];  Service: Endoscopy;  Laterality: N/A;  . FOOT SURGERY     right  . IR RADIOLOGY PERIPHERAL GUIDED IV START  09/28/2017  . IR US GUIDE VASC ACCESS RIGHT  09/28/2017  . LAPAROSCOPIC CHOLECYSTECTOMY SINGLE PORT N/A 06/01/2013   Procedure: LAPAROSCOPIC CHOLECYSTECTOMY SINGLE PORT;  Surgeon: Adin Hector, MD;  Location: [facility];  Service: General;  Laterality: N/A;  . MAZE N/A 11/16/2017   Procedure: MAZE;  Surgeon: Rexene Alberts, MD;  Location: [place];  Service: Open Heart Surgery;  Laterality: N/A;  . MITRAL VALVE REPAIR N/A 11/16/2017   Procedure: MITRAL VALVE  REPLACEMENT with 70mm MagnaEase;  Surgeon: Rexene Alberts, MD;  Location: [place];  Service: Open Heart Surgery;  Laterality: N/A;  . PACEMAKER IMPLANT N/A 11/21/2017   Procedure: PACEMAKER IMPLANT;  Surgeon: Evans Lance, MD;  Location: [place] CV LAB;  Service: Cardiovascular;  Laterality: N/A;  . POLYPECTOMY    . RIGHT/LEFT HEART CATH AND CORONARY ANGIOGRAPHY N/A 08/30/2017   Procedure: RIGHT/LEFT HEART CATH AND  CORONARY ANGIOGRAPHY;  Surgeon: Larey Dresser, MD;  Location: [place] CV LAB;  Service: Cardiovascular;  Laterality: N/A;  . SHOULDER ARTHROSCOPY WITH ROTATOR CUFF REPAIR AND SUBACROMIAL DECOMPRESSION Left 05/18/2017  . SHOULDER ARTHROSCOPY WITH ROTATOR CUFF REPAIR AND SUBACROMIAL DECOMPRESSION Left 05/18/2017   Procedure: SHOULDER ARTHROSCOPY WITH ROTATOR CUFF REPAIR AND SUBACROMIAL DECOMPRESSION;  Surgeon: Tania Ade, MD;  Location: [place];  Service: Orthopedics;  Laterality: Left;  LEFT SHOULDER ARTHROSCOPY WITH ROTATOR CUFF REPAIR AND SUBACROMIAL DECOMPRESSION  . TEE WITHOUT CARDIOVERSION N/A 05/09/2017   Procedure: TRANSESOPHAGEAL ECHOCARDIOGRAM (TEE);  Surgeon: Skeet Latch, MD;  Location: [place];  Service: Cardiovascular;  Laterality: N/A;  . TEE WITHOUT CARDIOVERSION N/A 08/30/2017   Procedure: TRANSESOPHAGEAL ECHOCARDIOGRAM (TEE);  Surgeon: Larey Dresser, MD;  Location: [facility] ENDOSCOPY;  Service: Cardiovascular;  Laterality: N/A;  . TEE WITHOUT CARDIOVERSION N/A 11/16/2017   Procedure: TRANSESOPHAGEAL ECHOCARDIOGRAM (TEE);  Surgeon: Rexene Alberts, MD;  Location: [place];  Service: Open Heart Surgery;  Laterality:  N/A;  . TEE WITHOUT CARDIOVERSION N/A 04/11/2018   Procedure: TRANSESOPHAGEAL ECHOCARDIOGRAM (TEE);  Surgeon: Larey Dresser, MD;  Location: [facility] ENDOSCOPY;  Service: Cardiovascular;  Laterality: N/A;  . TONSILLECTOMY      Current Outpatient Medications  Medication Sig Dispense Refill  . acetaminophen (TYLENOL) 325 MG tablet Take 1 tablet (325 mg total) by mouth every 6 (six) hours as needed for mild pain. 30 tablet 0  . amiodarone (PACERONE) 200 MG tablet Take 1 tablet (200 mg total) by mouth daily. 90 tablet 2  . amiodarone (PACERONE) 200 MG tablet Take 200 mg by mouth daily.    . calcium carbonate (TUMS - DOSED IN MG ELEMENTAL CALCIUM) 500 MG chewable tablet Chew 1 tablet by mouth daily as needed for indigestion or heartburn.    . carvedilol (COREG) 6.25 MG  tablet Take 2 tablets (12.5 mg) by mouth 2 (two) times daily. 360 tablet 3  . cetirizine (ZYRTEC) 10 MG tablet Take 10 mg by mouth daily as needed for allergies.    . citalopram (CELEXA) 20 MG tablet Take 1 tablet (20 mg total) by mouth at bedtime. 30 tablet 5  . docusate sodium (COLACE) 100 MG capsule Take 1 capsule (100 mg total) by mouth 3 (three) times daily as needed. 20 capsule 0  . ipratropium (ATROVENT) 0.03 % nasal spray Place 2 sprays into both nostrils 2 (two) times daily.     . methocarbamol (ROBAXIN) 500 MG tablet Take 1 tablet (500 mg total) by mouth every 8 (eight) hours as needed for muscle spasms. 30 tablet 0  . pantoprazole (PROTONIX) 40 MG tablet Take 1 tablet (40 mg total) by mouth daily. 30 tablet 1  . Pseudoeph-Doxylamine-DM-APAP (NYQUIL PO) Take by mouth as needed.    . rosuvastatin (CRESTOR) 10 MG tablet Take 1 tablet (10 mg total) by mouth daily. 90 tablet 1  . sacubitril-valsartan (ENTRESTO) 24-26 MG Take 1 tablet by mouth 2 (two) times daily. 180 tablet 3  . spironolactone (ALDACTONE) 25 MG tablet Take 1 tablet (25 mg total) by mouth daily. 90 tablet 1  . traZODone (DESYREL) 50 MG tablet Take 100 mg by mouth at bedtime.     [name] warfarin (COUMADIN) 5 MG tablet TAKE 1 TO 1.5 TABLETS BY MOUTH AS DIRECTED BY COUMADIN CLINIC 100 tablet 1   No current facility-administered medications for this encounter.     Allergies  Allergen Reactions  . Bee Venom Anaphylaxis and Swelling    Social History   Socioeconomic History  . Marital status: Married    Spouse name: Not on file  . Number of children: 3  . Years of education: Not on file  . Highest education level: Not on file  Occupational History    Employer: RETIRED  Social Needs  . Financial resource strain: Not on file  . Food insecurity    Worry: Not on file    Inability: Not on file  . Transportation needs    Medical: Not on file    Non-medical: Not on file  Tobacco Use  . Smoking status: Former Smoker     Packs/day: 0.50    Years: 57.00    Pack years: 28.50    Types: Cigarettes    Quit date: 07/13/1997    Years since quitting: 21.9  . Smokeless tobacco: Never Used  Substance and Sexual Activity  . Alcohol use: Yes    Alcohol/week: 2.0 - 3.0 standard drinks    Types: 2 - 3 Glasses of wine per week  Comment: moderate wine ; not since 8/ 2018  . Drug use: No  . Sexual activity: Yes  Lifestyle  . Physical activity    Days per week: Not on file    Minutes per session: Not on file  . Stress: Not on file  Relationships  . Social Herbalist on phone: Not on file    Gets together: Not on file    Attends religious service: Not on file    Active member of club or organization: Not on file    Attends meetings of clubs or organizations: Not on file    Relationship status: Not on file  . Intimate partner violence    Fear of current or ex partner: Not on file    Emotionally abused: Not on file    Physically abused: Not on file    Forced sexual activity: Not on file  Other Topics Concern  . Not on file  Social History Narrative   Two living children.  Lives with wife.       ROS- All systems are reviewed and negative except as per the HPI above.  Physical Exam: Vitals:   06/06/19 1325  BP: 132/74  Pulse: 60  Weight: 123.4 kg  Height: 5' 8.5" (1.74 m)   GEN- The patient is well appearing obese, elederly male, alert and oriented x 3 today.   HEENT-head normocephalic, atraumatic, sclera clear, conjunctiva pink, hearing intact, trachea midline. Lungs- Clear to ausculation bilaterally, normal work of breathing Heart- Regular rate and rhythm, no murmurs, rubs or gallops  GI- soft, NT, ND, + BS Extremities- no clubbing, cyanosis, or edema MS- no significant deformity or atrophy Skin- no rash or lesion Psych- euthymic mood, full affect Neuro- strength and sensation are intact   Wt Readings from Last 3 Encounters:  06/06/19 123.4 kg  05/08/19 121.1 kg  01/29/19 121.2 kg     EKG today demonstrates A paced rhythm with prolonged AV conduction (baseline). PR 360, QRS 100, QTc 480  Echo 11/2018 demonstrated - Left ventricle: The cavity size was normal. Wall thickness was increased in a pattern of moderate LVH. Systolic function was normal. The estimated ejection fraction was in the range of 50% to 55%. Septal hypokinesis. The study is not technically sufficient to allow evaluation of LV diastolic function. - Aortic valve: s/p Bioprosthetic AVR - no obstruction. Mean gradient (S): 10 mm Hg. Peak gradient (S): 21 mm Hg. Valve area (VTI): 1.59 cm^2. Valve area (Vmax): 1.42 cm^2. Valve area (Vmean): 1.52 cm^2. - Mitral valve: S/p bioprosthetic MVR - no obstruction. Valve area by pressure half-time: 2.12 cm^2. Valve area by continuity equation (using LVOT flow): 1.76 cm^2. - Left atrium: The atrium was at the upper limits of normal in size. - Right ventricle: Pacer wire or catheter noted in right ventricle. - Right atrium: The atrium was mildly dilated. Pacer wire or catheter noted in right atrium. - Atrial septum: No defect or patent foramen ovale was identified. - Tricuspid valve: There was trivial regurgitation. - Pulmonary arteries: PA peak pressure: 18 mm Hg (S). - Inferior vena cava: The vessel was normal in size. The respirophasic diameter changes were in the normal range (= 50%), consistent with normal central venous pressure.  Epic records are reviewed at length today  Assessment and Plan:  1. Persistent atrial fibrillation/atrial flutter Patient has converted to SR on amiodarone.  Decrease amiodarone to 200 mg daily. May consider decreasing to 100 mg daily if he continues to maintain SR.  Continue warfarin  Pt due for labwork at routine physical with PCP, will request copy. Continue Coreg 6.25 mg BID  This patients CHA2DS2-VASc Score and unadjusted Ischemic Stroke Rate (% per year) is equal to 7.2 % stroke rate/year  from a score of 5  Above score calculated as 1 point each if present [CHF, HTN, DM, Vascular=MI/PAD/Aortic Plaque, Age if 65-74, or Male] Above score calculated as 2 points each if present [Age > 75, or Stroke/TIA/TE]   2. Obesity Body mass index is 40.76 kg/m. Lifestyle modification was discussed and encouraged including regular physical activity and weight reduction.  3. HTN Stable, no change today.  4. PPM Followed by Dr Lovena Le and device clinic.  5. Chronic systolic CHF Weight stable, no signs of fluid overload. Continue present therapy.  6. CAD No anginal symptoms. Continue present therapy.  7. OSA Encourage compliance with CPAP therapy.    Follow up with Dr Aundra Dubin and Dr Lovena Le as scheduled. AF clinic in 4 months.   [facility] [address] [place], [place] [address] [phone] 06/06/2019 1:48 PM

## 2019-06-07 ENCOUNTER — Other Ambulatory Visit (HOSPITAL_COMMUNITY): Payer: Self-pay

## 2019-06-07 MED ORDER — ROSUVASTATIN CALCIUM 10 MG PO TABS: 10.0000 mg | ORAL_TABLET | Freq: Every day | ORAL | 3 refills | Status: DC

## 2019-06-17 ENCOUNTER — Encounter: Payer: Self-pay | Admitting: Cardiology

## 2019-06-17 NOTE — Progress Notes (Signed)
Remote pacemaker transmission.   

## 2019-06-18 ENCOUNTER — Ambulatory Visit (HOSPITAL_COMMUNITY)
Admission: RE | Admit: 2019-06-18 | Discharge: 2019-06-18 | Disposition: A | Discharge status: Home / Self Care | Payer: Medicare HMO | Source: Ambulatory Visit | Attending: Physician Assistant | Admitting: Physician Assistant

## 2019-06-18 ENCOUNTER — Other Ambulatory Visit: Payer: Self-pay

## 2019-06-18 DIAGNOSIS — Z79899 Other long term (current) drug therapy: Secondary | ICD-10-CM | POA: Insufficient documentation

## 2019-06-18 DIAGNOSIS — I4819 Other persistent atrial fibrillation: Secondary | ICD-10-CM | POA: Insufficient documentation

## 2019-06-18 LAB — TSH: TSH: 4.108 u[IU]/mL (ref 0.350–4.500)

## 2019-07-01 ENCOUNTER — Telehealth: Payer: Self-pay

## 2019-07-01 NOTE — Telephone Encounter (Signed)

## 2019-07-01 NOTE — Telephone Encounter (Signed)
lmom for prescreen  

## 2019-07-02 ENCOUNTER — Other Ambulatory Visit: Payer: Self-pay

## 2019-07-02 ENCOUNTER — Ambulatory Visit (INDEPENDENT_AMBULATORY_CARE_PROVIDER_SITE_OTHER): Payer: Medicare HMO

## 2019-07-02 DIAGNOSIS — Z951 Presence of aortocoronary bypass graft: Secondary | ICD-10-CM

## 2019-07-02 DIAGNOSIS — I480 Paroxysmal atrial fibrillation: Secondary | ICD-10-CM | POA: Diagnosis not present

## 2019-07-02 DIAGNOSIS — Z9889 Other specified postprocedural states: Secondary | ICD-10-CM

## 2019-07-02 DIAGNOSIS — Z953 Presence of xenogenic heart valve: Secondary | ICD-10-CM | POA: Diagnosis not present

## 2019-07-02 DIAGNOSIS — Z5181 Encounter for therapeutic drug level monitoring: Secondary | ICD-10-CM | POA: Diagnosis not present

## 2019-07-02 DIAGNOSIS — Z8679 Personal history of other diseases of the circulatory system: Secondary | ICD-10-CM | POA: Diagnosis not present

## 2019-07-02 LAB — POCT INR: INR: 3.9 — AB (ref 2.0–3.0)

## 2019-07-02 NOTE — Patient Instructions (Signed)
Description   Skip today's dosage of Coumadin, then start taking 1 tablet daily.  Recheck INR in 2 weeks.  Call with any new medications scheduled procedures or any questions  [phone]

## 2019-07-12 DIAGNOSIS — I350 Nonrheumatic aortic (valve) stenosis: Secondary | ICD-10-CM | POA: Diagnosis not present

## 2019-07-12 DIAGNOSIS — F3342 Major depressive disorder, recurrent, in full remission: Secondary | ICD-10-CM | POA: Diagnosis not present

## 2019-07-12 DIAGNOSIS — Z0000 Encounter for general adult medical examination without abnormal findings: Secondary | ICD-10-CM | POA: Diagnosis not present

## 2019-07-12 DIAGNOSIS — I679 Cerebrovascular disease, unspecified: Secondary | ICD-10-CM | POA: Diagnosis not present

## 2019-07-12 DIAGNOSIS — I739 Peripheral vascular disease, unspecified: Secondary | ICD-10-CM | POA: Diagnosis not present

## 2019-07-12 DIAGNOSIS — R7303 Prediabetes: Secondary | ICD-10-CM | POA: Diagnosis not present

## 2019-07-12 DIAGNOSIS — I10 Essential (primary) hypertension: Secondary | ICD-10-CM | POA: Diagnosis not present

## 2019-07-12 DIAGNOSIS — I2510 Atherosclerotic heart disease of native coronary artery without angina pectoris: Secondary | ICD-10-CM | POA: Diagnosis not present

## 2019-07-12 DIAGNOSIS — E782 Mixed hyperlipidemia: Secondary | ICD-10-CM | POA: Diagnosis not present

## 2019-07-16 ENCOUNTER — Ambulatory Visit (INDEPENDENT_AMBULATORY_CARE_PROVIDER_SITE_OTHER): Payer: Medicare HMO | Admitting: *Deleted

## 2019-07-16 ENCOUNTER — Other Ambulatory Visit: Payer: Self-pay

## 2019-07-16 DIAGNOSIS — Z9889 Other specified postprocedural states: Secondary | ICD-10-CM

## 2019-07-16 DIAGNOSIS — Z8679 Personal history of other diseases of the circulatory system: Secondary | ICD-10-CM | POA: Diagnosis not present

## 2019-07-16 DIAGNOSIS — Z951 Presence of aortocoronary bypass graft: Secondary | ICD-10-CM

## 2019-07-16 DIAGNOSIS — Z953 Presence of xenogenic heart valve: Secondary | ICD-10-CM | POA: Diagnosis not present

## 2019-07-16 DIAGNOSIS — Z5181 Encounter for therapeutic drug level monitoring: Secondary | ICD-10-CM | POA: Diagnosis not present

## 2019-07-16 LAB — POCT INR: INR: 3.3 — AB (ref 2.0–3.0)

## 2019-07-16 NOTE — Patient Instructions (Signed)
Description   Skip today's dosage of Coumadin, then start taking 1 tablet daily except for 1/2 a tab on Tuesday's.  Recheck INR in 3 weeks.  Call with any new medications scheduled procedures or any questions  [phone]

## 2019-07-17 ENCOUNTER — Telehealth (HOSPITAL_COMMUNITY): Payer: Self-pay | Admitting: Licensed Clinical Social Worker

## 2019-07-17 NOTE — Telephone Encounter (Signed)
CSW received consult that Brandon having issues with Brandon [name] cost.    CSW called and spoke with Brandon [name] regarding cost concerns.  CSW had spoken to Brandon [name] in may about impending expiration of Brandon PAN foundation to cover [employer] copay cost and had clinic send them [employer] application at that time.  Brandon [name] acknowledges that they received application but had not sent it back in yet.  They had not had to worry about refilling Entresto till this month and when they called to check copay it was almost $500 for 3 months since patient is in the donut hole for the year.  CSW confirmed that no Heart Failure copay grants open at this time.  Best chance is to turn in [employer] application and hope Brandon has spent enough on copays for the year to qualify- informed Brandon [name] of this- she will complete application and pharmacy records and return to [place].  Brandon [name] reports he has a month left of pills and will coming to the clinic right before he runs out- CSW informed her we could likely get him samples if we don't have a solution for the cost by that time.  CSW will continue to follow and assist as needed  Jorge Ny, [facility] Desk#: [phone] Cell#: [phone]

## 2019-08-06 ENCOUNTER — Other Ambulatory Visit: Payer: Self-pay

## 2019-08-06 ENCOUNTER — Ambulatory Visit (INDEPENDENT_AMBULATORY_CARE_PROVIDER_SITE_OTHER): Payer: Medicare HMO | Admitting: *Deleted

## 2019-08-06 DIAGNOSIS — Z953 Presence of xenogenic heart valve: Secondary | ICD-10-CM | POA: Diagnosis not present

## 2019-08-06 DIAGNOSIS — Z5181 Encounter for therapeutic drug level monitoring: Secondary | ICD-10-CM | POA: Diagnosis not present

## 2019-08-06 DIAGNOSIS — Z9889 Other specified postprocedural states: Secondary | ICD-10-CM

## 2019-08-06 DIAGNOSIS — Z8679 Personal history of other diseases of the circulatory system: Secondary | ICD-10-CM

## 2019-08-06 DIAGNOSIS — Z951 Presence of aortocoronary bypass graft: Secondary | ICD-10-CM

## 2019-08-06 DIAGNOSIS — I480 Paroxysmal atrial fibrillation: Secondary | ICD-10-CM | POA: Diagnosis not present

## 2019-08-06 LAB — POCT INR: INR: 3.4 — AB (ref 2.0–3.0)

## 2019-08-06 NOTE — Patient Instructions (Addendum)
Description   Skip today's dosage of Coumadin, then start taking 1 tablet daily except for 1/2 a tab on Tuesday and Thursday. Recheck INR in 3 weeks.  Call with any new medications, scheduled procedures or any questions  [phone]

## 2019-08-08 ENCOUNTER — Telehealth (HOSPITAL_COMMUNITY): Payer: Self-pay | Admitting: Licensed Clinical Social Worker

## 2019-08-08 NOTE — Telephone Encounter (Addendum)
CSW called to speak with pt and/or pt wife about Delene Loll cost concerns.  CSW had spoken with pt wife on 8/5 regarding impending cost concerns with Delene Loll and had supplied with Nov[employer] application at that time- CSW calling to check and see if that application had been completed so we could move forward with the application prior to patient running out of medication.  Pt wife states that her and the pt discussed the application and are not comfortable revealing their financial information at this time- they will plan to continue paying out of pocket for medication despite high cost and hope for PAN foundation to reopen.  CSW left message requesting call back to discuss.  Jorge Ny, LCSW Clinical Social Worker [facility] Desk#: [phone] Cell#: [phone]

## 2019-08-13 ENCOUNTER — Encounter (HOSPITAL_COMMUNITY): Payer: Medicare HMO | Admitting: Cardiology

## 2019-08-20 ENCOUNTER — Ambulatory Visit (HOSPITAL_COMMUNITY)
Admission: RE | Admit: 2019-08-20 | Discharge: 2019-08-20 | Disposition: A | Discharge status: Home / Self Care | Payer: Medicare HMO | Source: Ambulatory Visit | Attending: Cardiology | Admitting: Cardiology

## 2019-08-20 ENCOUNTER — Other Ambulatory Visit: Payer: Self-pay

## 2019-08-20 ENCOUNTER — Telehealth: Payer: Self-pay | Admitting: *Deleted

## 2019-08-20 ENCOUNTER — Other Ambulatory Visit (HOSPITAL_COMMUNITY): Payer: Self-pay | Admitting: Respiratory Therapy

## 2019-08-20 ENCOUNTER — Encounter (HOSPITAL_COMMUNITY): Payer: Self-pay | Admitting: Cardiology

## 2019-08-20 VITALS — BP 156/82 | HR 60 | Wt 277.6 lb

## 2019-08-20 DIAGNOSIS — I352 Nonrheumatic aortic (valve) stenosis with insufficiency: Secondary | ICD-10-CM | POA: Insufficient documentation

## 2019-08-20 DIAGNOSIS — Z79899 Other long term (current) drug therapy: Secondary | ICD-10-CM | POA: Insufficient documentation

## 2019-08-20 DIAGNOSIS — E669 Obesity, unspecified: Secondary | ICD-10-CM | POA: Diagnosis not present

## 2019-08-20 DIAGNOSIS — J449 Chronic obstructive pulmonary disease, unspecified: Secondary | ICD-10-CM | POA: Diagnosis not present

## 2019-08-20 DIAGNOSIS — K219 Gastro-esophageal reflux disease without esophagitis: Secondary | ICD-10-CM | POA: Diagnosis not present

## 2019-08-20 DIAGNOSIS — H9190 Unspecified hearing loss, unspecified ear: Secondary | ICD-10-CM | POA: Diagnosis not present

## 2019-08-20 DIAGNOSIS — N183 Chronic kidney disease, stage 3 (moderate): Secondary | ICD-10-CM | POA: Diagnosis not present

## 2019-08-20 DIAGNOSIS — E785 Hyperlipidemia, unspecified: Secondary | ICD-10-CM | POA: Insufficient documentation

## 2019-08-20 DIAGNOSIS — I4892 Unspecified atrial flutter: Secondary | ICD-10-CM | POA: Diagnosis not present

## 2019-08-20 DIAGNOSIS — I442 Atrioventricular block, complete: Secondary | ICD-10-CM | POA: Insufficient documentation

## 2019-08-20 DIAGNOSIS — D509 Iron deficiency anemia, unspecified: Secondary | ICD-10-CM | POA: Diagnosis not present

## 2019-08-20 DIAGNOSIS — Z951 Presence of aortocoronary bypass graft: Secondary | ICD-10-CM

## 2019-08-20 DIAGNOSIS — I5022 Chronic systolic (congestive) heart failure: Secondary | ICD-10-CM | POA: Diagnosis not present

## 2019-08-20 DIAGNOSIS — G4733 Obstructive sleep apnea (adult) (pediatric): Secondary | ICD-10-CM | POA: Diagnosis not present

## 2019-08-20 DIAGNOSIS — Z87891 Personal history of nicotine dependence: Secondary | ICD-10-CM | POA: Insufficient documentation

## 2019-08-20 DIAGNOSIS — I130 Hypertensive heart and chronic kidney disease with heart failure and stage 1 through stage 4 chronic kidney disease, or unspecified chronic kidney disease: Secondary | ICD-10-CM | POA: Diagnosis not present

## 2019-08-20 DIAGNOSIS — Z8673 Personal history of transient ischemic attack (TIA), and cerebral infarction without residual deficits: Secondary | ICD-10-CM | POA: Diagnosis not present

## 2019-08-20 DIAGNOSIS — Z7901 Long term (current) use of anticoagulants: Secondary | ICD-10-CM | POA: Diagnosis not present

## 2019-08-20 DIAGNOSIS — Z950 Presence of cardiac pacemaker: Secondary | ICD-10-CM | POA: Diagnosis not present

## 2019-08-20 DIAGNOSIS — Z953 Presence of xenogenic heart valve: Secondary | ICD-10-CM | POA: Insufficient documentation

## 2019-08-20 DIAGNOSIS — I480 Paroxysmal atrial fibrillation: Secondary | ICD-10-CM | POA: Diagnosis not present

## 2019-08-20 DIAGNOSIS — Z6841 Body Mass Index (BMI) 40.0 and over, adult: Secondary | ICD-10-CM | POA: Insufficient documentation

## 2019-08-20 DIAGNOSIS — I2510 Atherosclerotic heart disease of native coronary artery without angina pectoris: Secondary | ICD-10-CM | POA: Diagnosis not present

## 2019-08-20 DIAGNOSIS — I5042 Chronic combined systolic (congestive) and diastolic (congestive) heart failure: Secondary | ICD-10-CM | POA: Insufficient documentation

## 2019-08-20 LAB — COMPREHENSIVE METABOLIC PANEL
ALT: 12 U/L (ref 0–44)
AST: 18 U/L (ref 15–41)
Albumin: 4.2 g/dL (ref 3.5–5.0)
Alkaline Phosphatase: 25 U/L — ABNORMAL LOW (ref 38–126)
Anion gap: 15 (ref 5–15)
BUN: 39 mg/dL — ABNORMAL HIGH (ref 8–23)
CO2: 16 mmol/L — ABNORMAL LOW (ref 22–32)
Calcium: 9.2 mg/dL (ref 8.9–10.3)
Chloride: 106 mmol/L (ref 98–111)
Creatinine, Ser: 2.03 mg/dL — ABNORMAL HIGH (ref 0.61–1.24)
GFR calc Af Amer: 37 mL/min — ABNORMAL LOW (ref >60)
GFR calc non Af Amer: 32 mL/min — ABNORMAL LOW (ref >60)
Glucose, Bld: 108 mg/dL — ABNORMAL HIGH (ref 70–99)
Potassium: 4.4 mmol/L (ref 3.5–5.1)
Sodium: 137 mmol/L (ref 135–145)
Total Bilirubin: 0.4 mg/dL (ref 0.3–1.2)
Total Protein: 7.6 g/dL (ref 6.5–8.1)

## 2019-08-20 LAB — CBC
HCT: 42.7 % (ref 39.0–52.0)
Hemoglobin: 13.5 g/dL (ref 13.0–17.0)
MCH: 32.5 pg (ref 26.0–34.0)
MCHC: 31.6 g/dL (ref 30.0–36.0)
MCV: 102.6 fL — ABNORMAL HIGH (ref 80.0–100.0)
Platelets: 189 10*3/uL (ref 150–400)
RBC: 4.16 MIL/uL — ABNORMAL LOW (ref 4.22–5.81)
RDW: 13 % (ref 11.5–15.5)
WBC: 6.8 10*3/uL (ref 4.0–10.5)
nRBC: 0 % (ref 0.0–0.2)

## 2019-08-20 LAB — TSH: TSH: 4.442 u[IU]/mL (ref 0.350–4.500)

## 2019-08-20 MED ORDER — AMLODIPINE BESYLATE 2.5 MG PO TABS: 2.5000 mg | ORAL_TABLET | Freq: Every day | ORAL | 3 refills | Status: DC

## 2019-08-20 NOTE — Patient Instructions (Signed)
START amlodipine 2.5mg  daily.  Routine lab work today. Will notify you of abnormal results  Your provider requests you have a home sleep study. (they will contact you to set up)  Your provider requests you have pulmonary function tests.  Follow up in 3 months.

## 2019-08-20 NOTE — Telephone Encounter (Signed)
Manual PPM transmissions received 08/18/19. Normal device function. 1 NSVT episode noted on 07/23/19 (7 beats duration). Histograms blunted, similar to previous, rate response is not enabled. Spoke with patient's wife. She reports they were having some issues with his monitor, so they sent the transmissions to reconnect it. She reports he has been doing well recently. He is minimally active and has not reported any ShOB or other symptoms since converting to SR in 05/2019. Per pt's wife, Dr. Aundra Dubin advised him to start an exercise regimen today. Advised if he reports activity limitation due to fatigue/ShOB, that we can enable rate response. Pt's wife verbalizes understanding and agrees to call if he reports these symptoms. No further questions at this time.

## 2019-08-20 NOTE — Progress Notes (Signed)
[facility] Note   PCP: Dr. Justin Mend Cardiology: Dr. Radford Pax HF Cardiology: Dr. Aundra Dubin  73 y.o.with history of CVA, paroxysmal atrial fibrillation, valvular heart disease, and chronic diastolic CHF presents for evaluation of CHF and valvular heart disease.    He had had episodes of dyspnea and initial workup led to a TEE in 5/18 showing normal EF with moderate AS and moderate MR.  He continued to have episodic dyspnea and ended up admitted in 8/18 with shortness of breath and chest pressure.  This led to a long, complicated hospitalization.  He was noted to be volume overloaded with acute diastolic CHF and was also noted to have symptomatic runs of atrial fibrillation with RVR.  Amiodarone was started to control the atrial fibrillation.  He developed respiratory distress => Bipap => intubated.  He became hypotensive, requiring pressors.  He developed AKI.  PNA was noted and he received broad spectrum abx.  He had a prolonged intubation but was eventually extubated.  Given deconditioning from long hospitalization, he went to CIR for a couple of weeks.  LHC in 9/18 showed occluded PDA with left>right collaterals.  TEE in 9/18 showed EF 50%, severe MR (possibly infarct-related with restricted posterior leaflet, and moderate AS + moderate-severe AI (possibly rheumatic).   In 12/18, he had cardiac surgery with bioprosthetic aortic and mitral valves placed.  He had SVG-PDA, Maze, and LA appendage clipping.  Post-op course was complicated by CHB requiring Medtronic dual chamber PPM (His bundle lead placed but captured right bundle so induced LBBB).   Echo 01/02/18 LVEF 30-35%, bioprosthetic MV and AoV function normally, RV mildly dilated with normal systolic function.    He was noted to be in atrial flutter with mild RVR and had TEE-guided DCCV on 04/11/18. TEE showed EF 25-30%, normal bioprosthetic aortic and mitral valves.  Echo in 6/19 showed EF 30-35%, normal bioprosthetic aortic and mitral  valves.  Echo 12/19 with EF up to 50-55%, normal bioprosthetic mitral and aortic valves, normal RV.   Earlier this year, he went back into atrial fibrillation.  Amiodarone was started, and he returned to NSR.   He returns today for followup of CHF and valvular disease.  Weight is up 10 lbs.  He has not been getting much exercise.  He has daytime sleepiness and fatigue (prior OSA diagnosis but did not want to use CPAP).  He is in NSR today, he feels better generally, more energy, when in NSR. BP has been running high.  No dyspnea with exertion though not very active.  No orthopnea/PND.  No chest pain.  No lightheadedness.     ECG (personally reviewed): a-paced with long 1st degree AVB, IVCD 126 msec  Labs (9/18): K 4.5, creatinine 1.42, LDL 90, HDL 36, LFTs normal, TSH normal, hgb 9.6 Labs (10/18): K 4.3, creatinine 1.45 Labs (12/18): K 3.8, creatinine 1.56 Labs (1/19): creatinine 1.68, LDL 66 Labs (3/19): K 4.2, creatinine 1.56 Labs (4/19): K 4.4, creatinine 1.71, hgb 11.8, LDL 75, HDL 43 Labs (6/19): K 4.8, creatinine 2.1, LDL 75, TGs 246 Labs (8/19): K 4.6, creatinine 2.07 Labs (11/19): K 4.6, creatinine 1.29 Labs (2/20): K 4.8, creatinine 1.98 Labs (5/20): LDL 49, HDL 39 Labs (7/20): TSH normal  PMH: 1. CVA: Right MCA, 2004.  Minimal residual problems.  2. HTN 3. Hyperlipidemia 4. Left rotator cuff surgery 6/18 5. Carotid stenosis: s/p right carotid stent.  6. GERD 7. H/o CCY 8. Anemia: FOBT negative.  9. Gout  10. Atrial fibrillation: Paroxysmal.  Maze in 12/18 with LAA clipping.  - Recurrent atrial fibrillation 2020 => amiodarone.  11. Valvular heart disease: TEE (5/18) with EF 55-60%, moderate AS mean 33 mmHg, moderate MR, normal RV size and systolic function.  - Echo (8/18): EF 55-60%, moderate LVH, moderate AS with mean gradient 33 mmHg, moderate AI, moderate to severe MR.  - TEE (9/18): EF 50%, mild LV dilation, suspect severe MR with posterior leaflet restricted  (?infarct-related MR), ERO 0.4 cm^2, moderate AS/moderate-severe AI (possibly rheumatic).   - In 12/18, he had cardiac surgery with bioprosthetic aortic and mitral valves placed.  He had SVG-PDA, Maze, and LA appendage clipping. - Echo (1/19): LVEF 30-35%, bioprosthetic MV and AoV function normally, RV mildly dilated with normal systolic function.  - TEE (5/19) with EF 25-30%, normal bioprosthetic aortic valve, normal bioprosthetic mitral valve. - Echo (6/19): EF 30-35%, mild LV dilation with diffuse hypokinesis, normal RV size with mildly decreased systolic function, normal-appearing bioprosthetic mitral and aortic valves.   - Echo (12/19): EF 50-55%, moderate LVH, bioprosthetic aortic valve with mean gradient 10 mmHg, normally-functioning bioprosthetic mitral valve.  12. Chronic systolic CHF 13. Deafness 14. CKD: stage 3.  15. 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  17. COPD: Mild obstruction on 9/18 PFTs.  18. Post-op CHB in 12/18 with Medtronic dual chamber PPM.  19. Chronic systolic CHF: Echo ([id number]) with EF down to 30-35% post-op.  - TEE (5/19) with EF 25-30%, normal bioprosthetic aortic valve, normal bioprosthetic mitral valve.  - Echo (12/19): EF 50-55%, moderate LVH, bioprosthetic aortic valve with mean gradient 10 mmHg, normally-functioning bioprosthetic mitral valve.  20. Atrial flutter: DCCV in 5/19.  21. OSA: Not using CPAP.   SH: Quit smoking 8/18.  Married, 2 children, retired.    Family History  Problem Relation Age of Onset  . Stroke Father        No details  . Angina Mother    Review of systems complete and found to be negative unless listed in HPI.    Current Outpatient Medications  Medication Sig Dispense Refill  . acetaminophen (TYLENOL) 325 MG tablet Take 1 tablet (325 mg total) by mouth every 6 (six) hours as needed for  mild pain. 30 tablet 0  . amiodarone (PACERONE) 200 MG tablet Take 200 mg by mouth daily.    . calcium carbonate (TUMS - DOSED IN MG ELEMENTAL CALCIUM) 500 MG chewable tablet Chew 1 tablet by mouth daily as needed for indigestion or heartburn.    . carvedilol (COREG) 6.25 MG tablet Take 2 tablets (12.5 mg) by mouth 2 (two) times daily. 360 tablet 3  . cetirizine (ZYRTEC) 10 MG tablet Take 10 mg by mouth daily as needed for allergies.    . citalopram (CELEXA) 20 MG tablet Take 1 tablet (20 mg total) by mouth at bedtime. 30 tablet 5  . docusate sodium (COLACE) 100 MG capsule Take 1 capsule (100 mg total) by mouth 3 (three) times daily as needed. 20 capsule 0  . pantoprazole (PROTONIX) 40 MG tablet Take 1 tablet (40 mg total) by mouth daily. 30 tablet 1  . Pseudoeph-Doxylamine-DM-APAP (NYQUIL PO) Take by mouth as needed.    . rosuvastatin (CRESTOR) 10 MG tablet Take 1 tablet (10 mg total) by mouth daily. 90 tablet 3  . sacubitril-valsartan (ENTRESTO) 24-26 MG Take 1 tablet by mouth 2 (two) times daily.  180 tablet 3  . spironolactone (ALDACTONE) 25 MG tablet Take 1 tablet (25 mg total) by mouth daily. 90 tablet 1  . traZODone (DESYREL) 50 MG tablet Take 100 mg by mouth at bedtime.     [name] warfarin (COUMADIN) 5 MG tablet TAKE 1 TO 1.5 TABLETS BY MOUTH AS DIRECTED BY COUMADIN CLINIC 100 tablet 1  . amLODipine (NORVASC) 2.5 MG tablet Take 1 tablet (2.5 mg total) by mouth daily. 90 tablet 3  . methocarbamol (ROBAXIN) 500 MG tablet Take 1 tablet (500 mg total) by mouth every 8 (eight) hours as needed for muscle spasms. (Patient not taking: Reported on 08/20/2019) 30 tablet 0   No current facility-administered medications for this encounter.    Vitals:   08/20/19 1014  BP: (!) 156/82  Pulse: 60  SpO2: 93%  Weight: 125.9 kg (277 lb 9.6 oz)   Wt Readings from Last 3 Encounters:  08/20/19 125.9 kg (277 lb 9.6 oz)  06/06/19 123.4 kg (272 lb)  05/08/19 121.1 kg (267 lb)   General: NAD Neck: No JVD, no  thyromegaly or thyroid nodule.  Lungs: Clear to auscultation bilaterally with normal respiratory effort. CV: Nondisplaced PMI.  Heart regular S1/S2, no S3/S4, 2/6 SEM RUSB.  No peripheral edema.  No carotid bruit.  Normal pedal pulses.  Abdomen: Soft, nontender, no hepatosplenomegaly, no distention.  Skin: Intact without lesions or rashes.  Neurologic: Alert and oriented x 3.  Psych: Normal affect. Extremities: No clubbing or cyanosis.  HEENT: Normal.   Assessment/Plan: 1. Valvular heart disease: TEE in 9/18 showed severe MR, probably infarct-related with restrictive posterior mitral leaflet. There was moderate aortic stenosis and moderate-severe aortic insufficiency, possible rheumatic.  In 12/18, he had bioprosthetic MVR and AVR. Valves stable on 12/19 echo.  - Antibiotic prophylaxis needed with dental work.  2. Chronic combined CHF: In setting of significant valvular disease as above. EF down to 30-35% post-op (1/19 echo), likely reflects true LV systolic function without the volume load from severe MR and moderate-severe AI.  TEE in 5/19 showed EF 25-30%.  Repeat echo in 6/19 with EF 30-35%. Repeat echo in 12/19 showed EF up to 50-55%.  He is not volume overloaded on exam.  NYHA class II symptoms.  He is only very rarely RV-pacing now.  - EF out of range for ICD.  - Can use Lasix prn.     - Continue Coreg 12.5 mg bid.   - Continue Entresto 24/26 bid.  - Continue spironolactone 25 mg daily.  BMET today.  3. CKD: Stage 3. BMET today.  4. Atrial fibrillation/flutter: Paroxysmal. He has had a Maze procedure.  He tends to tolerate atrial arrhythmias poorly.  He had atrial flutter with DCCV in 5/19.  Recurrent atrial fibrillation in 2020, now on amiodarone and back in NSR.   - He is on warfarin. CBC today.  - Continue amiodarone 200 mg daily, can likely decrease to 100 mg daily next appt if he stays in NSR.  Check LFTs, TSH today.  He will need an eye exam, discussed today.  He will get baseline  PFTs.  5. COPD: Mild obstruction on prior PFTs.  He has quit smoking.  Stable. No change.   6. CVA: Remote, minimal residual. S/p carotid stenting.  - No ASA given warfarin use.  7. Hyperlipidemia: Good lipids in 5/20.      8. Anemia: Fe deficiency.   9. 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  The artery to the PDA was occluded with left to right collaterals.  He had SVG-PDA in 12/18. No chest pain.  - Continue statin.  Good lipids in 5/20.    - No ASA given warfarin use.  10. CHB: has Medtronic PPM.  Minimal RV pacing now.  11. OSA: Daytime sleepiness.  Had remote sleep study, had OSA but did not want CPAP.  We discussed linkage of OSA and atrial fibrillation, he is willing to use CPAP if needed.  - I will arrange for home sleep study.  12. Obesity: We discussed diet/exercise for weight loss.  13. HTN: I will not increase Entresto with CKD or Coreg with 1st degree AVB (avoid RV pacing).  - Add amlodipine 2.5 mg daily . - Weight loss will also help.   Followup in 3 months.   Loralie Champagne 08/20/2019

## 2019-08-22 DIAGNOSIS — H04123 Dry eye syndrome of bilateral lacrimal glands: Secondary | ICD-10-CM | POA: Diagnosis not present

## 2019-08-26 ENCOUNTER — Other Ambulatory Visit (HOSPITAL_COMMUNITY): Payer: Self-pay | Admitting: *Deleted

## 2019-08-26 DIAGNOSIS — I480 Paroxysmal atrial fibrillation: Secondary | ICD-10-CM | POA: Diagnosis not present

## 2019-08-26 DIAGNOSIS — I129 Hypertensive chronic kidney disease with stage 1 through stage 4 chronic kidney disease, or unspecified chronic kidney disease: Secondary | ICD-10-CM | POA: Diagnosis not present

## 2019-08-26 DIAGNOSIS — N183 Chronic kidney disease, stage 3 (moderate): Secondary | ICD-10-CM | POA: Diagnosis not present

## 2019-08-26 MED ORDER — FUROSEMIDE 40 MG PO TABS: 40.0000 mg | ORAL_TABLET | Freq: Every day | ORAL | 3 refills | Status: DC

## 2019-08-27 ENCOUNTER — Ambulatory Visit (INDEPENDENT_AMBULATORY_CARE_PROVIDER_SITE_OTHER): Payer: Medicare HMO | Admitting: *Deleted

## 2019-08-27 ENCOUNTER — Other Ambulatory Visit: Payer: Self-pay

## 2019-08-27 DIAGNOSIS — Z951 Presence of aortocoronary bypass graft: Secondary | ICD-10-CM | POA: Diagnosis not present

## 2019-08-27 DIAGNOSIS — Z5181 Encounter for therapeutic drug level monitoring: Secondary | ICD-10-CM | POA: Diagnosis not present

## 2019-08-27 DIAGNOSIS — Z9889 Other specified postprocedural states: Secondary | ICD-10-CM | POA: Diagnosis not present

## 2019-08-27 DIAGNOSIS — Z8679 Personal history of other diseases of the circulatory system: Secondary | ICD-10-CM | POA: Diagnosis not present

## 2019-08-27 DIAGNOSIS — I480 Paroxysmal atrial fibrillation: Secondary | ICD-10-CM

## 2019-08-27 DIAGNOSIS — Z953 Presence of xenogenic heart valve: Secondary | ICD-10-CM | POA: Diagnosis not present

## 2019-08-27 LAB — POCT INR: INR: 3 (ref 2.0–3.0)

## 2019-08-27 NOTE — Patient Instructions (Signed)
Description   Continue taking 1 tablet daily except for 1/2 a tab on Tuesday and Thursday. Recheck INR in 4 weeks.  Call with any new medications, scheduled procedures or any questions  [phone]

## 2019-09-05 ENCOUNTER — Ambulatory Visit (INDEPENDENT_AMBULATORY_CARE_PROVIDER_SITE_OTHER): Payer: Medicare HMO | Admitting: *Deleted

## 2019-09-05 DIAGNOSIS — I442 Atrioventricular block, complete: Secondary | ICD-10-CM

## 2019-09-05 LAB — CUP PACEART REMOTE DEVICE CHECK
Battery Remaining Longevity: 135 mo
Battery Voltage: 3.03 V
Brady Statistic AP VP Percent: 0.08 %
Brady Statistic AP VS Percent: 87.99 %
Brady Statistic AS VP Percent: 0.04 %
Brady Statistic AS VS Percent: 11.89 %
Brady Statistic RA Percent Paced: 88.14 %
Brady Statistic RV Percent Paced: 0.12 %
Date Time Interrogation Session: 20200924051436
Implantable Lead Implant Date: 20181211
Implantable Lead Implant Date: 20181211
Implantable Lead Location: 753859
Implantable Lead Location: 753860
Implantable Lead Model: 3830
Implantable Lead Model: 5076
Implantable Pulse Generator Implant Date: 20181211
Lead Channel Impedance Value: 266 Ohm
Lead Channel Impedance Value: 342 Ohm
Lead Channel Impedance Value: 399 Ohm
Lead Channel Impedance Value: 494 Ohm
Lead Channel Pacing Threshold Amplitude: 0.5 V
Lead Channel Pacing Threshold Amplitude: 1.125 V
Lead Channel Pacing Threshold Pulse Width: 0.4 ms
Lead Channel Pacing Threshold Pulse Width: 0.4 ms
Lead Channel Sensing Intrinsic Amplitude: 4.25 mV
Lead Channel Sensing Intrinsic Amplitude: 4.25 mV
Lead Channel Sensing Intrinsic Amplitude: 4.625 mV
Lead Channel Sensing Intrinsic Amplitude: 4.625 mV
Lead Channel Setting Pacing Amplitude: 2 V
Lead Channel Setting Pacing Amplitude: 2.5 V
Lead Channel Setting Pacing Pulse Width: 1 ms
Lead Channel Setting Sensing Sensitivity: 1.2 mV

## 2019-09-10 DIAGNOSIS — Z23 Encounter for immunization: Secondary | ICD-10-CM | POA: Diagnosis not present

## 2019-09-11 ENCOUNTER — Encounter: Payer: Self-pay | Admitting: Cardiology

## 2019-09-11 NOTE — Progress Notes (Signed)
Remote pacemaker transmission.   

## 2019-09-14 ENCOUNTER — Inpatient Hospital Stay (HOSPITAL_COMMUNITY): Admission: RE | Admit: 2019-09-14 | Payer: Medicare HMO | Source: Ambulatory Visit

## 2019-09-14 ENCOUNTER — Other Ambulatory Visit (HOSPITAL_COMMUNITY)
Admission: RE | Admit: 2019-09-14 | Discharge: 2019-09-14 | Disposition: A | Discharge status: Home / Self Care | Payer: Medicare HMO | Source: Ambulatory Visit | Attending: Cardiology | Admitting: Cardiology

## 2019-09-14 DIAGNOSIS — Z20828 Contact with and (suspected) exposure to other viral communicable diseases: Secondary | ICD-10-CM | POA: Insufficient documentation

## 2019-09-14 DIAGNOSIS — Z01812 Encounter for preprocedural laboratory examination: Secondary | ICD-10-CM | POA: Diagnosis not present

## 2019-09-16 LAB — NOVEL CORONAVIRUS, NAA (HOSP ORDER, SEND-OUT TO REF LAB; TAT 18-24 HRS): SARS-CoV-2, NAA: NOT DETECTED

## 2019-09-17 ENCOUNTER — Ambulatory Visit (HOSPITAL_COMMUNITY)
Admission: RE | Admit: 2019-09-17 | Discharge: 2019-09-17 | Disposition: A | Discharge status: Home / Self Care | Payer: Medicare HMO | Source: Ambulatory Visit | Attending: Cardiology | Admitting: Cardiology

## 2019-09-17 ENCOUNTER — Other Ambulatory Visit: Payer: Self-pay

## 2019-09-17 DIAGNOSIS — I5022 Chronic systolic (congestive) heart failure: Secondary | ICD-10-CM | POA: Diagnosis not present

## 2019-09-17 LAB — PULMONARY FUNCTION TEST
DL/VA % pred: 42 %
DL/VA: 1.68 ml/min/mmHg/L
DLCO unc % pred: 21 %
DLCO unc: 5.92 ml/min/mmHg
FEF 25-75 Post: 2.5 L/sec
FEF 25-75 Pre: 2.7 L/sec
FEF2575-%Change-Post: -7 %
FEF2575-%Pred-Post: 99 %
FEF2575-%Pred-Pre: 107 %
FEV1-%Change-Post: -2 %
FEV1-%Pred-Post: 79 %
FEV1-%Pred-Pre: 81 %
FEV1-Post: 2.69 L
FEV1-Pre: 2.77 L
FEV1FVC-%Change-Post: 2 %
FEV1FVC-%Pred-Pre: 108 %
FEV6-%Change-Post: -4 %
FEV6-%Pred-Post: 75 %
FEV6-%Pred-Pre: 79 %
FEV6-Post: 3.31 L
FEV6-Pre: 3.47 L
FEV6FVC-%Pred-Post: 106 %
FEV6FVC-%Pred-Pre: 106 %
FVC-%Change-Post: -5 %
FVC-%Pred-Post: 71 %
FVC-%Pred-Pre: 75 %
FVC-Post: 3.31 L
FVC-Pre: 3.5 L
Post FEV1/FVC ratio: 81 %
Post FEV6/FVC ratio: 100 %
Pre FEV1/FVC ratio: 79 %
Pre FEV6/FVC Ratio: 100 %
RV % pred: 135 %
RV: 3.55 L
TLC % pred: 96 %
TLC: 7.21 L

## 2019-09-17 MED ORDER — ALBUTEROL SULFATE (2.5 MG/3ML) 0.083% IN NEBU
2.5000 mg | INHALATION_SOLUTION | Freq: Once | RESPIRATORY_TRACT | Status: AC
  Administered 2019-09-17: 2.5 mg via RESPIRATORY_TRACT

## 2019-09-20 ENCOUNTER — Telehealth (HOSPITAL_COMMUNITY): Payer: Self-pay

## 2019-09-20 NOTE — Telephone Encounter (Signed)
Pts wife aware of results of PFT's, verbalized understanding.

## 2019-09-20 NOTE — Telephone Encounter (Signed)
-----   Message from Larey Dresser, MD sent at 09/17/2019 10:56 PM EDT ----- Minimal obstruction but there is decreased DLCO concerning for pulmonary vascular disease.

## 2019-09-24 ENCOUNTER — Other Ambulatory Visit: Payer: Self-pay

## 2019-09-24 ENCOUNTER — Ambulatory Visit (INDEPENDENT_AMBULATORY_CARE_PROVIDER_SITE_OTHER): Payer: Medicare HMO | Admitting: *Deleted

## 2019-09-24 DIAGNOSIS — Z953 Presence of xenogenic heart valve: Secondary | ICD-10-CM

## 2019-09-24 DIAGNOSIS — Z9889 Other specified postprocedural states: Secondary | ICD-10-CM

## 2019-09-24 DIAGNOSIS — Z8679 Personal history of other diseases of the circulatory system: Secondary | ICD-10-CM

## 2019-09-24 DIAGNOSIS — I480 Paroxysmal atrial fibrillation: Secondary | ICD-10-CM

## 2019-09-24 DIAGNOSIS — Z951 Presence of aortocoronary bypass graft: Secondary | ICD-10-CM

## 2019-09-24 DIAGNOSIS — Z5181 Encounter for therapeutic drug level monitoring: Secondary | ICD-10-CM

## 2019-09-24 LAB — POCT INR: INR: 3.1 — AB (ref 2.0–3.0)

## 2019-09-24 NOTE — Patient Instructions (Signed)
Description   Tomorrow take 1/2 tablet then start taking 1 tablet daily except for 1/2 tablet on Tuesday, Thursday, and Sunday. Recheck INR in 2 weeks.  Call with any new medications, scheduled procedures or any questions  [phone]

## 2019-10-09 ENCOUNTER — Encounter (HOSPITAL_COMMUNITY): Payer: Self-pay

## 2019-10-10 ENCOUNTER — Other Ambulatory Visit (HOSPITAL_COMMUNITY): Payer: Self-pay

## 2019-10-10 DIAGNOSIS — G4733 Obstructive sleep apnea (adult) (pediatric): Secondary | ICD-10-CM

## 2019-10-10 NOTE — Progress Notes (Signed)
Pt reports did not receive any information regarding sleep study.  It appears it was suggested at September visit however not ordered. Order placed, stop bang completed today. Will fax all documents once order is signed by MD.

## 2019-10-10 NOTE — Progress Notes (Signed)
Patient Name:  Brandon [name]         DOB: [birth year]      Height: 5'11    Weight: 277 lb  Office Name: [facility]         Referring Provider: Dr Aundra Dubin  Today's Date: 10/10/2019  Date:   STOP BANG RISK ASSESSMENT S (snore) Have you been told that you snore?     YES   T (tired) Are you often tired, fatigued, or sleepy during the day?   YES  O (obstruction) Do you stop breathing, choke, or gasp during sleep? YES  P (pressure) Do you have or are you being treated for high blood pressure? YES   B (BMI) Is your body index greater than 35 kg/m? YES   A (age) Are you 3 years old or older? YES   N (neck) Do you have a neck circumference greater than 16 inches?   NA   G (gender) Are you a male? YES   TOTAL STOP/BANG "YES" ANSWERS 7                                                                       For Office Use Only              Procedure Order Form    YES to 3+ Stop Bang questions OR two clinical symptoms - patient qualifies for [employer] (CPT 95800)     Submit: This Form + Patient Face Sheet + Clinical Note via [employer] or Fax: [phone]         Clinical Notes: Will consult Sleep Specialist and refer for management of therapy due to patient increased risk of Sleep Apnea. Ordering a sleep study due to the following two clinical symptoms: Excessive daytime sleepiness G47.10 // / Loud snoring R06.83 History of high blood pressure R03.0    I understand that I am proceeding with a home sleep apnea test as ordered by my treating physician. I understand that untreated sleep apnea is a serious cardiovascular risk factor and it is my responsibility to perform the test and seek management for sleep apnea. I will be contacted with the results and be managed for sleep apnea by a local sleep physician. I will be receiving equipment and further instructions from [facility]. I shall promptly ship back the equipment via the included mailing label. I understand my insurance  will be billed for the test and as the patient I am responsible for any insurance related out-of-pocket costs incurred. I have been provided with written instructions and can call for additional video or telephonic instruction, with 24-hour availability of qualified personnel to answer any questions: Patient Help Desk [phone].  Patient Signature ______________________________________________________   Date______________________ Patient Telemedicine Verbal Consent

## 2019-10-10 NOTE — Progress Notes (Signed)
Order, OV note, stop bang and demographics all faxed to [facility] at [phone] via epic  

## 2019-10-11 ENCOUNTER — Other Ambulatory Visit: Payer: Self-pay

## 2019-10-11 ENCOUNTER — Ambulatory Visit (INDEPENDENT_AMBULATORY_CARE_PROVIDER_SITE_OTHER): Payer: Medicare HMO | Admitting: *Deleted

## 2019-10-11 DIAGNOSIS — Z951 Presence of aortocoronary bypass graft: Secondary | ICD-10-CM | POA: Diagnosis not present

## 2019-10-11 DIAGNOSIS — I480 Paroxysmal atrial fibrillation: Secondary | ICD-10-CM | POA: Diagnosis not present

## 2019-10-11 DIAGNOSIS — Z9889 Other specified postprocedural states: Secondary | ICD-10-CM | POA: Diagnosis not present

## 2019-10-11 DIAGNOSIS — Z8679 Personal history of other diseases of the circulatory system: Secondary | ICD-10-CM | POA: Diagnosis not present

## 2019-10-11 DIAGNOSIS — Z953 Presence of xenogenic heart valve: Secondary | ICD-10-CM

## 2019-10-11 DIAGNOSIS — Z5181 Encounter for therapeutic drug level monitoring: Secondary | ICD-10-CM | POA: Diagnosis not present

## 2019-10-11 LAB — POCT INR: INR: 2.4 (ref 2.0–3.0)

## 2019-10-11 NOTE — Patient Instructions (Signed)
Description   Continue taking 1 tablet daily except for 1/2 tablet on Tuesdays, Thursdays, and Sundays. Recheck INR in 3 weeks.  Call with any new medications, scheduled procedures or any questions  [phone]

## 2019-10-14 ENCOUNTER — Telehealth (HOSPITAL_COMMUNITY): Payer: Self-pay | Admitting: Licensed Clinical Social Worker

## 2019-10-14 NOTE — Telephone Encounter (Signed)
CSW received notification that patient is now able to reapply for [employer] assistance with Entresto.  CSW called pt to confirm they had no other form of assistance at this time- pt states they have no other assistance and is agreeable to applying for [employer] foundation grant.  CSW confirmed all of patients information was still correct and submitted application for review.  Will receive an email within 4 days with determination  CSW will continue to follow and assist as needed  Kaito Schulenburg H. Cayden Rautio, LCSW Clinical Social Worker [facility] Desk#: [phone] Cell#: [phone]  

## 2019-10-16 ENCOUNTER — Telehealth (HOSPITAL_COMMUNITY): Payer: Self-pay | Admitting: Licensed Clinical Social Worker

## 2019-10-16 ENCOUNTER — Other Ambulatory Visit (HOSPITAL_COMMUNITY): Payer: Self-pay

## 2019-10-16 MED ORDER — ENTRESTO 24-26 MG PO TABS: 1.0000 | ORAL_TABLET | Freq: Two times a day (BID) | ORAL | 6 refills | Status: DC

## 2019-10-16 MED FILL — ENTRESTO 24 MG-26 MG TABLET: 24-26 | 30 days supply | Qty: 60 | Fill #0

## 2019-10-16 NOTE — Telephone Encounter (Signed)
CSW received notification that pt was approved for [facility] assistance through the [employer].  Approval Details . Patient ID Number: [id number] . Assistance Program: Heart Failure . Group Number: [id number] . Patient assistance starts on: 07/18/2019 and continues until 07/16/2020 . Expenses can be submitted until: Monday, September 14, 2020 . Amount of Assistance Available: 1,000.00 . Covered Services: Entresto (sacubitril/valsartan) . Application Submitted by: Tammy Sours  Details provided to pt pharmacy- CSW will continue to follow and assist as needed.  Jorge Ny, LCSW Clinical Social Worker [facility] Desk#: [phone] Cell#: [phone]

## 2019-10-23 ENCOUNTER — Other Ambulatory Visit (HOSPITAL_COMMUNITY): Payer: Self-pay | Admitting: Cardiology

## 2019-11-01 ENCOUNTER — Ambulatory Visit (INDEPENDENT_AMBULATORY_CARE_PROVIDER_SITE_OTHER): Payer: Medicare HMO | Admitting: *Deleted

## 2019-11-01 ENCOUNTER — Other Ambulatory Visit: Payer: Self-pay

## 2019-11-01 DIAGNOSIS — Z9889 Other specified postprocedural states: Secondary | ICD-10-CM

## 2019-11-01 DIAGNOSIS — I480 Paroxysmal atrial fibrillation: Secondary | ICD-10-CM | POA: Diagnosis not present

## 2019-11-01 DIAGNOSIS — Z8679 Personal history of other diseases of the circulatory system: Secondary | ICD-10-CM | POA: Diagnosis not present

## 2019-11-01 DIAGNOSIS — Z951 Presence of aortocoronary bypass graft: Secondary | ICD-10-CM

## 2019-11-01 DIAGNOSIS — Z5181 Encounter for therapeutic drug level monitoring: Secondary | ICD-10-CM

## 2019-11-01 DIAGNOSIS — Z953 Presence of xenogenic heart valve: Secondary | ICD-10-CM

## 2019-11-01 LAB — POCT INR: INR: 3 (ref 2.0–3.0)

## 2019-11-01 NOTE — Patient Instructions (Addendum)
Description   Continue taking 1 tablet daily except for 1/2 tablet on Tuesdays, Thursdays, and Sundays. Recheck INR in 4 weeks.  Call with any new medications, scheduled procedures or any questions  [phone]     

## 2019-11-08 MED FILL — ENTRESTO 24 MG-26 MG TABLET: 24-26 | 30 days supply | Qty: 60 | Fill #1

## 2019-11-12 NOTE — Progress Notes (Signed)
Cardiology Office Note Date:  11/14/2019  Patient ID:  Brandon [name], Brandon [name] [birth year], MRN [id number] PCP:  Maurice Small, MD  Cardiologist:  Dr. Harrington Challenger  AHF: Dr. Aundra Dubin Electrophysiologist: Dr. Lovena Le   Chief Complaint:   annual visit  History of Present Illness: Brandon [name] is a 73 y.o. male with history of stroke, AFib, HTN, HLD, stroke, PVD w/remote R Carotid stent, CKD (III), OSA not using CPAP, CAD, VHD s/p w/ bioprosthetic AVR and MVR, CABG, MAZE, LAA clipping in Dec 2018 >> post op CHB >> PPM,Dr. Aundra Dubin notes (His bundle lead placed but captured right bundle so induced LBBB), chronic combined CHF.  Jan 2019 LVEF reduced 30-35%, valves functioning well.  Had some recurrent AFib, flutter requiring DCCV. Last echo 120/2019 improved LVEF 50-55% He has had some more AFib placed on amiodarone  He comes in today to be seen for Dr. Lovena Le.   Last seen by him Dec 2019.  Discussed device programming to avoid pacing with improved conduction, pt was feeling better, subsequent improvement in LVEF with avoidance of RV pacing.  More recently saw Dr. Aundra Dubin in Sept 2020.  He was doing better, planned to get sleep study done, the pt now agreeable to use CPAP if needed.  His coreg not increased given long 1st degree AVBlock (avoid RV pacing), and unable to increase entresto with CKD.  Amlodipine was added for BP   He feels well.  Does not do too much in the way of physical activity, but denies any difficulties with ADLs, shopping, and so on. No CP, SOB, no palpitations or cardiac awareness.  NO dizzy spells, near syncope or syncope.  No bleeding or signs of bleeding with his warfarin.    Device information MDT dual chamber PPM, (RA/HIS leads) implanted 11/21/2017   Past Medical History:  Diagnosis Date  . Anemia 07/13/2017  . Anxiety   . Aortic insufficiency   . Aortic stenosis, moderate 07/13/2017  . Arthritis    back   . Carotid stenosis    Right carotid stent (widely patent) 40 -  59% left plaque 11/13  . COPD (chronic obstructive pulmonary disease) ([place])   . Coronary artery disease involving native coronary artery of native heart without angina pectoris   . Depression   . Dyslipidemia   . GERD (gastroesophageal reflux disease)   . Heart murmur   . Hemiplegia affecting unspecified side, late effect of cerebrovascular disease    resolved- from L side   . Hypertension   . Jaundice    resolved following ERCP & Cholecystectomy  . Mild emphysema ([place])   . Mitral regurgitation   . Mitral valve insufficiency and aortic valve insufficiency   . Myocardial infarction ([place])   . Paroxysmal atrial fibrillation ([facility])   . Pre-diabetes    per spouse  . S/P aortic valve replacement with bioprosthetic valve 11/16/2017   23 mm [facility] Ease stented bovine pericardial tissue valve  . S/P CABG x 1 11/16/2017   SVG to PDA with EVH via right thigh  . S/P Maze operation for atrial fibrillation 11/16/2017   Left side lesion set using bipolar radiofrequency and cryothermy ablation with clipping of LA appendage  . S/P mitral valve replacement with bioprosthetic valve 11/16/2017   27 mm [facility] Mitral stented bovine pericardial tissue valve  . Sleep apnea    does not wear CPAP  . Sleep concern    resulted in surgery- after + sleep test. Pt. doesn't have a problem any  longer.   . Stroke ([place]) 03/11/2003   stent placed on the 31, 3, 2004, L side   . Wears glasses   . Wears hearing aid in both ears   . Wears partial dentures     Past Surgical History:  Procedure Laterality Date  . AORTIC VALVE REPLACEMENT N/A 11/16/2017   Procedure: AORTIC VALVE REPLACEMENT (AVR) with 48mm Magna Ease;  Surgeon: Rexene Alberts, MD;  Location: [place];  Service: Open Heart Surgery;  Laterality: N/A;  . ARTERIAL LINE INSERTION Right 11/16/2017   Procedure: ARTERIAL LINE INSERTION -RIGHT FEMORAL;  Surgeon: Rexene Alberts, MD;  Location: [place];  Service: Open Heart Surgery;  Laterality: Right;   . BACK SURGERY     lumbar back  . CARDIOVERSION N/A 04/11/2018   Procedure: CARDIOVERSION;  Surgeon: Larey Dresser, MD;  Location: [facility] ENDOSCOPY;  Service: Cardiovascular;  Laterality: N/A;  . CHOLECYSTECTOMY    . CORONARY ARTERY BYPASS GRAFT N/A 11/16/2017   Procedure: CORONARY ARTERY BYPASS GRAFTING (CABG) x 1;  Surgeon: Rexene Alberts, MD;  Location: [place];  Service: Open Heart Surgery;  Laterality: N/A;  . ENDOVEIN HARVEST OF GREATER SAPHENOUS VEIN Right 11/16/2017   Procedure: ENDOVEIN HARVEST OF GREATER SAPHENOUS VEIN;  Surgeon: Rexene Alberts, MD;  Location: [place];  Service: Open Heart Surgery;  Laterality: Right;  . ERCP N/A 05/31/2013   Procedure: ENDOSCOPIC RETROGRADE CHOLANGIOPANCREATOGRAPHY (ERCP);  Surgeon: Ladene Artist, MD;  Location: [facility];  Service: Endoscopy;  Laterality: N/A;  . FOOT SURGERY     right  . IR RADIOLOGY PERIPHERAL GUIDED IV START  09/28/2017  . IR US GUIDE VASC ACCESS RIGHT  09/28/2017  . LAPAROSCOPIC CHOLECYSTECTOMY SINGLE PORT N/A 06/01/2013   Procedure: LAPAROSCOPIC CHOLECYSTECTOMY SINGLE PORT;  Surgeon: Adin Hector, MD;  Location: [facility];  Service: General;  Laterality: N/A;  . MAZE N/A 11/16/2017   Procedure: MAZE;  Surgeon: Rexene Alberts, MD;  Location: [place];  Service: Open Heart Surgery;  Laterality: N/A;  . MITRAL VALVE REPAIR N/A 11/16/2017   Procedure: MITRAL VALVE  REPLACEMENT with 42mm MagnaEase;  Surgeon: Rexene Alberts, MD;  Location: [place];  Service: Open Heart Surgery;  Laterality: N/A;  . PACEMAKER IMPLANT N/A 11/21/2017   Procedure: PACEMAKER IMPLANT;  Surgeon: Evans Lance, MD;  Location: [place] [facility];  Service: Cardiovascular;  Laterality: N/A;  . POLYPECTOMY    . RIGHT/LEFT HEART CATH AND CORONARY ANGIOGRAPHY N/A 08/30/2017   Procedure: RIGHT/LEFT HEART CATH AND CORONARY ANGIOGRAPHY;  Surgeon: Larey Dresser, MD;  Location: [place] CV LAB;  Service: Cardiovascular;  Laterality: N/A;  . SHOULDER ARTHROSCOPY  WITH ROTATOR CUFF REPAIR AND SUBACROMIAL DECOMPRESSION Left 05/18/2017  . SHOULDER ARTHROSCOPY WITH ROTATOR CUFF REPAIR AND SUBACROMIAL DECOMPRESSION Left 05/18/2017   Procedure: SHOULDER ARTHROSCOPY WITH ROTATOR CUFF REPAIR AND SUBACROMIAL DECOMPRESSION;  Surgeon: Tania Ade, MD;  Location: [place];  Service: Orthopedics;  Laterality: Left;  LEFT SHOULDER ARTHROSCOPY WITH ROTATOR CUFF REPAIR AND SUBACROMIAL DECOMPRESSION  . TEE WITHOUT CARDIOVERSION N/A 05/09/2017   Procedure: TRANSESOPHAGEAL ECHOCARDIOGRAM (TEE);  Surgeon: Skeet Latch, MD;  Location: [place];  Service: Cardiovascular;  Laterality: N/A;  . TEE WITHOUT CARDIOVERSION N/A 08/30/2017   Procedure: TRANSESOPHAGEAL ECHOCARDIOGRAM (TEE);  Surgeon: Larey Dresser, MD;  Location: [facility] ENDOSCOPY;  Service: Cardiovascular;  Laterality: N/A;  . TEE WITHOUT CARDIOVERSION N/A 11/16/2017   Procedure: TRANSESOPHAGEAL ECHOCARDIOGRAM (TEE);  Surgeon: Rexene Alberts, MD;  Location: [place];  Service:  Open Heart Surgery;  Laterality: N/A;  . TEE WITHOUT CARDIOVERSION N/A 04/11/2018   Procedure: TRANSESOPHAGEAL ECHOCARDIOGRAM (TEE);  Surgeon: Larey Dresser, MD;  Location: [facility] ENDOSCOPY;  Service: Cardiovascular;  Laterality: N/A;  . TONSILLECTOMY      Current Outpatient Medications  Medication Sig Dispense Refill  . acetaminophen (TYLENOL) 325 MG tablet Take 1 tablet (325 mg total) by mouth every 6 (six) hours as needed for mild pain. 30 tablet 0  . amiodarone (PACERONE) 200 MG tablet Take 200 mg by mouth daily.    [name] amLODipine (NORVASC) 2.5 MG tablet Take 1 tablet (2.5 mg total) by mouth daily. 90 tablet 3  . calcium carbonate (TUMS - DOSED IN MG ELEMENTAL CALCIUM) 500 MG chewable tablet Chew 1 tablet by mouth daily as needed for indigestion or heartburn.    . carvedilol (COREG) 6.25 MG tablet Take 2 tablets (12.5 mg) by mouth 2 (two) times daily. 360 tablet 3  . cetirizine (ZYRTEC) 10 MG tablet Take 10 mg by mouth daily as needed for  allergies.    . citalopram (CELEXA) 20 MG tablet Take 1 tablet (20 mg total) by mouth at bedtime. 30 tablet 5  . docusate sodium (COLACE) 100 MG capsule Take 1 capsule (100 mg total) by mouth 3 (three) times daily as needed. 20 capsule 0  . furosemide (LASIX) 40 MG tablet Take 1 tablet (40 mg total) by mouth daily. 90 tablet 3  . methocarbamol (ROBAXIN) 500 MG tablet Take 1 tablet (500 mg total) by mouth every 8 (eight) hours as needed for muscle spasms. 30 tablet 0  . pantoprazole (PROTONIX) 40 MG tablet Take 1 tablet (40 mg total) by mouth daily. 30 tablet 1  . Pseudoeph-Doxylamine-DM-APAP (NYQUIL PO) Take by mouth as needed.    . rosuvastatin (CRESTOR) 10 MG tablet Take 1 tablet (10 mg total) by mouth daily. 90 tablet 3  . sacubitril-valsartan (ENTRESTO) 24-26 MG Take 1 tablet by mouth 2 (two) times daily. 60 tablet 6  . spironolactone (ALDACTONE) 25 MG tablet TAKE 1 TABLET EVERY DAY 90 tablet 1  . traZODone (DESYREL) 50 MG tablet Take 100 mg by mouth at bedtime.     [name] warfarin (COUMADIN) 5 MG tablet TAKE 1 TO 1.5 TABLETS BY MOUTH AS DIRECTED BY COUMADIN CLINIC 100 tablet 1   No current facility-administered medications for this visit.     Allergies:   Bee venom   Social History:  The patient  reports that he quit smoking about 22 years ago. His smoking use included cigarettes. He has a 28.50 pack-year smoking history. He has never used smokeless tobacco. He reports current alcohol use of about 2.0 - 3.0 standard drinks of alcohol per week. He reports that he does not use drugs.   Family History:  The patient's family history includes Angina in his mother; Stroke in his father.   ROS:  Please see the history of present illness.    All other systems are reviewed and otherwise negative.   PHYSICAL EXAM:  VS:  BP 122/78   Pulse 61   Ht 5' 10.5" (1.791 m)   Wt 280 lb (127 kg)   BMI 39.61 kg/m  BMI: Body mass index is 39.61 kg/m. Well nourished, well developed, in no acute distress   HEENT: normocephalic, atraumatic  Neck: no JVD, carotid bruits or masses Cardiac:  RRR; soft SM, no rubs, or gallops Lungs:  CTA b/l, no wheezing, rhonchi or rales  Abd: soft, nontender MS: no deformity or atrophy  Ext:no edema  Skin: warm and dry, no rash Neuro:  No gross deficits appreciated Psych: euthymic mood, full affect  PPM site is stable, no tethering or discomfort   EKG:  Not done today PPM interrogation done today and reviewed by myself:  Battery and lead measurements are good He has had 3 NSVT, longest 2 seconds His AFib burden says 13.6%, though this includes this past summer, he has not had AFib since June VP 0.4%   11/20/2019: TTE Study Conclusions - Left ventricle: The cavity size was normal. Wall thickness was   increased in a pattern of moderate LVH. Systolic function was   normal. The estimated ejection fraction was in the range of 50%   to 55%. Septal hypokinesis. The study is not technically   sufficient to allow evaluation of LV diastolic function. - Aortic valve: s/p Bioprosthetic AVR - no obstruction. Mean   gradient (S): 10 mm Hg. Peak gradient (S): 21 mm Hg. Valve area   (VTI): 1.59 cm^2. Valve area (Vmax): 1.42 cm^2. Valve area   (Vmean): 1.52 cm^2. - Mitral valve: S/p bioprosthetic MVR - no obstruction. Valve area   by pressure half-time: 2.12 cm^2. Valve area by continuity   equation (using LVOT flow): 1.76 cm^2. - Left atrium: The atrium was at the upper limits of normal in   size. - Right ventricle: Pacer wire or catheter noted in right ventricle. - Right atrium: The atrium was mildly dilated. Pacer wire or   catheter noted in right atrium. - Atrial septum: No defect or patent foramen ovale was identified. - Tricuspid valve: There was trivial regurgitation. - Pulmonary arteries: PA peak pressure: 18 mm Hg (S). - Inferior vena cava: The vessel was normal in size. The   respirophasic diameter changes were in the normal range (= 50%),    consistent with normal central venous pressure.  Impressions: - Compared to a prior study in 05/2018, the LVEF has improved to   50-55% with septal hypokinesis. The bioprosthetic AVR and MVR   gradients are stable.   Recent Labs: 08/20/2019: ALT 12; BUN 39; Creatinine, Ser 2.03; Hemoglobin 13.5; Platelets 189; Potassium 4.4; Sodium 137; TSH 4.442  05/01/2019: Cholesterol 125; HDL 39; LDL Cholesterol 49; Total CHOL/HDL Ratio 3.2; Triglycerides 187; VLDL 37   CrCl cannot be calculated (Patient's most recent lab result is older than the maximum 21 days allowed.).   Wt Readings from Last 3 Encounters:  11/14/19 280 lb (127 kg)  08/20/19 277 lb 9.6 oz (125.9 kg)  06/06/19 272 lb (123.4 kg)     Other studies reviewed: Additional studies/records reviewed today include: summarized above  ASSESSMENT AND PLAN:  1. PPM     Intact function, no programming changes made  2. CAD     No anginal complaints     On BB, statin tx, not on ASA given his warfarin     He has not seen Dr. Harrington Challenger in a couple years, has been following with Dr. Aundra Dubin regularly      3. VHD     S/p bioprosthetic AVR/MVR     Stable function by last echo     Dr. Aundra Dubin mentions no ASA tx for his CAD/PVD given his warfarin, will defer this to Dr. Aundra Dubin   4. Chronic combined CHF     Improved LVEF     No symptoms or exam findings to suggest volume OL     He weighs 3x/week, reports stable weights     On BB/ entresto, furosemide,  aldactone     C/w Dr. Aundra Dubin   5. Persistent AFib     CHA2DS2Vasc is 4, on warfarin, monitored and managed by the coumadin clinic     maintaining SR with amioadrone     surveillance labs in Sept looked OK, no pulmonary symptoms   Disposition: F/u with remotes Q 3 mo, and in clinic with EP in 1 year, sooner if needed    Current medicines are reviewed at length with the patient today.  The patient did not have any concerns regarding medicines.  Venetia Night, PA-C 11/14/2019 11:47 AM      [facility] [place] [address] [phone] (office)  [phone] (fax)

## 2019-11-14 ENCOUNTER — Other Ambulatory Visit: Payer: Self-pay

## 2019-11-14 ENCOUNTER — Ambulatory Visit (INDEPENDENT_AMBULATORY_CARE_PROVIDER_SITE_OTHER): Payer: Medicare HMO | Admitting: Physician Assistant

## 2019-11-14 VITALS — BP 122/78 | HR 61 | Ht 70.5 in | Wt 280.0 lb

## 2019-11-14 DIAGNOSIS — I4819 Other persistent atrial fibrillation: Secondary | ICD-10-CM | POA: Diagnosis not present

## 2019-11-14 DIAGNOSIS — I5033 Acute on chronic diastolic (congestive) heart failure: Secondary | ICD-10-CM

## 2019-11-14 DIAGNOSIS — I5042 Chronic combined systolic (congestive) and diastolic (congestive) heart failure: Secondary | ICD-10-CM | POA: Diagnosis not present

## 2019-11-14 DIAGNOSIS — Z952 Presence of prosthetic heart valve: Secondary | ICD-10-CM | POA: Diagnosis not present

## 2019-11-14 DIAGNOSIS — I2510 Atherosclerotic heart disease of native coronary artery without angina pectoris: Secondary | ICD-10-CM | POA: Diagnosis not present

## 2019-11-14 DIAGNOSIS — Z950 Presence of cardiac pacemaker: Secondary | ICD-10-CM | POA: Diagnosis not present

## 2019-11-14 NOTE — Patient Instructions (Signed)
Medication Instructions:  Your physician recommends that you continue on your current medications as directed. Please refer to the Current Medication list given to you today.  *If you need a refill on your cardiac medications before your next appointment, please call your pharmacy*  Lab Work: [place]   If you have labs (blood work) drawn today and your tests are completely normal, you will receive your results only by: [name] MyChart Message (if you have MyChart) OR . A paper copy in the mail If you have any lab test that is abnormal or we need to change your treatment, we will call you to review the results.  Testing/Procedures: NONE ORDERED  TODAY  Follow-Up: At [facility], you and your health needs are our priority.  As part of our continuing mission to provide you with exceptional heart care, we have created designated Provider Care Teams.  These Care Teams include your primary Cardiologist (physician) and Advanced Practice Providers (APPs -  Physician Assistants and Nurse Practitioners) who all work together to provide you with the care you need, when you need it.  Your next appointment:   1 year(s)  The format for your next appointment:   In Person  Provider:   You may see Dr Lovena Le  or one of the following Advanced Practice Providers on your designated Care Team:    Chanetta Marshall, NP  Tommye Standard, PA-C  Legrand Como "Oda Kilts, [place]   Other Instructions

## 2019-11-15 ENCOUNTER — Telehealth (HOSPITAL_COMMUNITY): Payer: Self-pay | Admitting: *Deleted

## 2019-11-15 NOTE — Telephone Encounter (Signed)
Left VM offering to change office visit to virtual visit due to covid19 precautions. Requested pt return my call to determine if they will keep office visit or change to virtual visit.  

## 2019-11-20 ENCOUNTER — Ambulatory Visit (HOSPITAL_COMMUNITY)
Admission: RE | Admit: 2019-11-20 | Discharge: 2019-11-20 | Disposition: A | Discharge status: Home / Self Care | Payer: Medicare HMO | Source: Ambulatory Visit | Attending: Cardiology | Admitting: Cardiology

## 2019-11-20 ENCOUNTER — Encounter (HOSPITAL_COMMUNITY): Payer: Self-pay | Admitting: Cardiology

## 2019-11-20 ENCOUNTER — Other Ambulatory Visit: Payer: Self-pay

## 2019-11-20 VITALS — BP 112/63 | HR 60 | Wt 277.2 lb

## 2019-11-20 DIAGNOSIS — I4892 Unspecified atrial flutter: Secondary | ICD-10-CM | POA: Insufficient documentation

## 2019-11-20 DIAGNOSIS — I2510 Atherosclerotic heart disease of native coronary artery without angina pectoris: Secondary | ICD-10-CM | POA: Diagnosis not present

## 2019-11-20 DIAGNOSIS — N183 Chronic kidney disease, stage 3 unspecified: Secondary | ICD-10-CM | POA: Diagnosis not present

## 2019-11-20 DIAGNOSIS — E785 Hyperlipidemia, unspecified: Secondary | ICD-10-CM | POA: Diagnosis not present

## 2019-11-20 DIAGNOSIS — M109 Gout, unspecified: Secondary | ICD-10-CM | POA: Insufficient documentation

## 2019-11-20 DIAGNOSIS — G4733 Obstructive sleep apnea (adult) (pediatric): Secondary | ICD-10-CM | POA: Diagnosis not present

## 2019-11-20 DIAGNOSIS — D509 Iron deficiency anemia, unspecified: Secondary | ICD-10-CM | POA: Diagnosis not present

## 2019-11-20 DIAGNOSIS — I352 Nonrheumatic aortic (valve) stenosis with insufficiency: Secondary | ICD-10-CM | POA: Insufficient documentation

## 2019-11-20 DIAGNOSIS — Z87891 Personal history of nicotine dependence: Secondary | ICD-10-CM | POA: Insufficient documentation

## 2019-11-20 DIAGNOSIS — Z8249 Family history of ischemic heart disease and other diseases of the circulatory system: Secondary | ICD-10-CM | POA: Insufficient documentation

## 2019-11-20 DIAGNOSIS — I130 Hypertensive heart and chronic kidney disease with heart failure and stage 1 through stage 4 chronic kidney disease, or unspecified chronic kidney disease: Secondary | ICD-10-CM | POA: Insufficient documentation

## 2019-11-20 DIAGNOSIS — I5022 Chronic systolic (congestive) heart failure: Secondary | ICD-10-CM

## 2019-11-20 DIAGNOSIS — Z7901 Long term (current) use of anticoagulants: Secondary | ICD-10-CM | POA: Diagnosis not present

## 2019-11-20 DIAGNOSIS — I5042 Chronic combined systolic (congestive) and diastolic (congestive) heart failure: Secondary | ICD-10-CM | POA: Insufficient documentation

## 2019-11-20 DIAGNOSIS — Z823 Family history of stroke: Secondary | ICD-10-CM | POA: Diagnosis not present

## 2019-11-20 DIAGNOSIS — J449 Chronic obstructive pulmonary disease, unspecified: Secondary | ICD-10-CM | POA: Diagnosis not present

## 2019-11-20 DIAGNOSIS — I252 Old myocardial infarction: Secondary | ICD-10-CM | POA: Insufficient documentation

## 2019-11-20 DIAGNOSIS — E669 Obesity, unspecified: Secondary | ICD-10-CM | POA: Insufficient documentation

## 2019-11-20 DIAGNOSIS — K219 Gastro-esophageal reflux disease without esophagitis: Secondary | ICD-10-CM | POA: Diagnosis not present

## 2019-11-20 DIAGNOSIS — Z953 Presence of xenogenic heart valve: Secondary | ICD-10-CM | POA: Diagnosis not present

## 2019-11-20 DIAGNOSIS — Z8673 Personal history of transient ischemic attack (TIA), and cerebral infarction without residual deficits: Secondary | ICD-10-CM | POA: Diagnosis not present

## 2019-11-20 DIAGNOSIS — Z79899 Other long term (current) drug therapy: Secondary | ICD-10-CM | POA: Diagnosis not present

## 2019-11-20 DIAGNOSIS — I480 Paroxysmal atrial fibrillation: Secondary | ICD-10-CM | POA: Diagnosis not present

## 2019-11-20 LAB — COMPREHENSIVE METABOLIC PANEL
ALT: 13 U/L (ref 0–44)
AST: 18 U/L (ref 15–41)
Albumin: 3.9 g/dL (ref 3.5–5.0)
Alkaline Phosphatase: 27 U/L — ABNORMAL LOW (ref 38–126)
Anion gap: 13 (ref 5–15)
BUN: 26 mg/dL — ABNORMAL HIGH (ref 8–23)
CO2: 20 mmol/L — ABNORMAL LOW (ref 22–32)
Calcium: 9.2 mg/dL (ref 8.9–10.3)
Chloride: 105 mmol/L (ref 98–111)
Creatinine, Ser: 1.87 mg/dL — ABNORMAL HIGH (ref 0.61–1.24)
GFR calc Af Amer: 40 mL/min — ABNORMAL LOW (ref >60)
GFR calc non Af Amer: 35 mL/min — ABNORMAL LOW (ref >60)
Glucose, Bld: 114 mg/dL — ABNORMAL HIGH (ref 70–99)
Potassium: 4.4 mmol/L (ref 3.5–5.1)
Sodium: 138 mmol/L (ref 135–145)
Total Bilirubin: 0.5 mg/dL (ref 0.3–1.2)
Total Protein: 7.4 g/dL (ref 6.5–8.1)

## 2019-11-20 LAB — CBC
HCT: 41.8 % (ref 39.0–52.0)
Hemoglobin: 13.1 g/dL (ref 13.0–17.0)
MCH: 32.5 pg (ref 26.0–34.0)
MCHC: 31.3 g/dL (ref 30.0–36.0)
MCV: 103.7 fL — ABNORMAL HIGH (ref 80.0–100.0)
Platelets: 203 10*3/uL (ref 150–400)
RBC: 4.03 MIL/uL — ABNORMAL LOW (ref 4.22–5.81)
RDW: 12.8 % (ref 11.5–15.5)
WBC: 6.2 10*3/uL (ref 4.0–10.5)
nRBC: 0 % (ref 0.0–0.2)

## 2019-11-20 LAB — TSH: TSH: 4.585 u[IU]/mL — ABNORMAL HIGH (ref 0.350–4.500)

## 2019-11-20 NOTE — Progress Notes (Signed)
Advanced Heart Failure Clinic Note   PCP: Dr. Justin Mend Cardiology: Dr. Radford Pax HF Cardiology: Dr. Aundra Dubin  73 y.o.with history of CVA, paroxysmal atrial fibrillation, valvular heart disease, and chronic diastolic CHF presents for evaluation of CHF and valvular heart disease.    He had had episodes of dyspnea and initial workup led to a TEE in 5/18 showing normal EF with moderate AS and moderate MR.  He continued to have episodic dyspnea and ended up admitted in 8/18 with shortness of breath and chest pressure.  This led to a long, complicated hospitalization.  He was noted to be volume overloaded with acute diastolic CHF and was also noted to have symptomatic runs of atrial fibrillation with RVR.  Amiodarone was started to control the atrial fibrillation.  He developed respiratory distress => Bipap => intubated.  He became hypotensive, requiring pressors.  He developed AKI.  PNA was noted and he received broad spectrum abx.  He had a prolonged intubation but was eventually extubated.  Given deconditioning from long hospitalization, he went to CIR for a couple of weeks.  LHC in 9/18 showed occluded PDA with left>right collaterals.  TEE in 9/18 showed EF 50%, severe MR (possibly infarct-related with restricted posterior leaflet, and moderate AS + moderate-severe AI (possibly rheumatic).   In 12/18, he had cardiac surgery with bioprosthetic aortic and mitral valves placed.  He had SVG-PDA, Maze, and LA appendage clipping.  Post-op course was complicated by CHB requiring Medtronic dual chamber PPM (His bundle lead placed but captured right bundle so induced LBBB).   Echo 01/02/18 LVEF 30-35%, bioprosthetic MV and AoV function normally, RV mildly dilated with normal systolic function.    He was noted to be in atrial flutter with mild RVR and had TEE-guided DCCV on 04/11/18. TEE showed EF 25-30%, normal bioprosthetic aortic and mitral valves.  Echo in 6/19 showed EF 30-35%, normal bioprosthetic aortic and mitral  valves.  Echo 12/19 with EF up to 50-55%, normal bioprosthetic mitral and aortic valves, normal RV.   Earlier this year, he went back into atrial fibrillation.  Amiodarone was started, and he returned to NSR.   He returns today for followup of CHF and valvular disease.  He is a-paced today.  No significant exertional dyspnea, no chest pain, no palpitations.  Still with daytime sleepiness and snoring but his insurance will not cover a home sleep study.   ECG (personally reviewed): a-paced with long 1st degree AVB, LAFB, old inferior MI, poor RWP  Labs (9/18): K 4.5, creatinine 1.42, LDL 90, HDL 36, LFTs normal, TSH normal, hgb 9.6 Labs (10/18): K 4.3, creatinine 1.45 Labs (12/18): K 3.8, creatinine 1.56 Labs (1/19): creatinine 1.68, LDL 66 Labs (3/19): K 4.2, creatinine 1.56 Labs (4/19): K 4.4, creatinine 1.71, hgb 11.8, LDL 75, HDL 43 Labs (6/19): K 4.8, creatinine 2.1, LDL 75, TGs 246 Labs (8/19): K 4.6, creatinine 2.07 Labs (11/19): K 4.6, creatinine 1.29 Labs (2/20): K 4.8, creatinine 1.98 Labs (5/20): LDL 49, HDL 39 Labs (7/20): TSH normal Labs (9/20): TSH normal, K 4.4, creatinine 2.03, hgb 13.5, LFTs normal  PMH: 1. CVA: Right MCA, 2004.  Minimal residual problems.  2. HTN 3. Hyperlipidemia 4. Left rotator cuff surgery 6/18 5. Carotid stenosis: s/p right carotid stent.  6. GERD 7. H/o CCY 8. Anemia: FOBT negative.  9. Gout  10. Atrial fibrillation: Paroxysmal. Maze in 12/18 with LAA clipping.  - Recurrent atrial fibrillation 2020 => amiodarone.  11. Valvular heart disease: TEE (5/18) with EF 55-60%, moderate  AS mean 33 mmHg, moderate MR, normal RV size and systolic function.  - Echo (8/18): EF 55-60%, moderate LVH, moderate AS with mean gradient 33 mmHg, moderate AI, moderate to severe MR.  - TEE (9/18): EF 50%, mild LV dilation, suspect severe MR with posterior leaflet restricted (?infarct-related MR), ERO 0.4 cm^2, moderate AS/moderate-severe AI (possibly rheumatic).   -  In 12/18, he had cardiac surgery with bioprosthetic aortic and mitral valves placed.  He had SVG-PDA, Maze, and LA appendage clipping. - Echo (1/19): LVEF 30-35%, bioprosthetic MV and AoV function normally, RV mildly dilated with normal systolic function.  - TEE (5/19) with EF 25-30%, normal bioprosthetic aortic valve, normal bioprosthetic mitral valve. - Echo (6/19): EF 30-35%, mild LV dilation with diffuse hypokinesis, normal RV size with mildly decreased systolic function, normal-appearing bioprosthetic mitral and aortic valves.   - Echo (12/19): EF 50-55%, moderate LVH, bioprosthetic aortic valve with mean gradient 10 mmHg, normally-functioning bioprosthetic mitral valve.  12. Chronic systolic CHF 13. Deafness 14. CKD: stage 3.  15. 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  17. COPD: Mild obstruction on 9/18 PFTs.  - PFTs (10/20): Mild obstruction, decreased DLCO.  18. Post-op CHB in 12/18 with Medtronic dual chamber PPM.  19. Chronic systolic CHF: Echo ([id number]) with EF down to 30-35% post-op.  - TEE (5/19) with EF 25-30%, normal bioprosthetic aortic valve, normal bioprosthetic mitral valve.  - Echo (12/19): EF 50-55%, moderate LVH, bioprosthetic aortic valve with mean gradient 10 mmHg, normally-functioning bioprosthetic mitral valve.  20. Atrial flutter: DCCV in 5/19.  21. OSA: Not using CPAP.   SH: Quit smoking 8/18.  Married, 2 children, retired.    Family History  Problem Relation Age of Onset  . Stroke Father        No details  . Angina Mother    Review of systems complete and found to be negative unless listed in HPI.    Current Outpatient Medications  Medication Sig Dispense Refill  . acetaminophen (TYLENOL) 325 MG tablet Take 1 tablet (325 mg total) by mouth every 6 (six) hours as needed for mild pain. 30 tablet 0  . amiodarone  (PACERONE) 200 MG tablet Take 100 mg by mouth daily.    [name] amLODipine (NORVASC) 2.5 MG tablet Take 1 tablet (2.5 mg total) by mouth daily. 90 tablet 3  . calcium carbonate (TUMS - DOSED IN MG ELEMENTAL CALCIUM) 500 MG chewable tablet Chew 1 tablet by mouth daily as needed for indigestion or heartburn.    . carvedilol (COREG) 6.25 MG tablet Take 2 tablets (12.5 mg) by mouth 2 (two) times daily. 360 tablet 3  . cetirizine (ZYRTEC) 10 MG tablet Take 10 mg by mouth daily as needed for allergies.    . citalopram (CELEXA) 20 MG tablet Take 1 tablet (20 mg total) by mouth at bedtime. 30 tablet 5  . docusate sodium (COLACE) 100 MG capsule Take 1 capsule (100 mg total) by mouth 3 (three) times daily as needed. 20 capsule 0  . furosemide (LASIX) 40 MG tablet Take 1 tablet (40 mg total) by mouth daily. 90 tablet 3  . methocarbamol (ROBAXIN) 500 MG tablet Take 1 tablet (500 mg total) by mouth every 8 (eight) hours as needed for muscle spasms. 30 tablet 0  . pantoprazole (PROTONIX) 40 MG tablet Take 1 tablet (40 mg total) by mouth daily. [place]  tablet 1  . Pseudoeph-Doxylamine-DM-APAP (NYQUIL PO) Take by mouth as needed.    . rosuvastatin (CRESTOR) 10 MG tablet Take 1 tablet (10 mg total) by mouth daily. 90 tablet 3  . sacubitril-valsartan (ENTRESTO) 24-26 MG Take 1 tablet by mouth 2 (two) times daily. 60 tablet 6  . spironolactone (ALDACTONE) 25 MG tablet TAKE 1 TABLET EVERY DAY 90 tablet 1  . traZODone (DESYREL) 50 MG tablet Take 100 mg by mouth at bedtime.     [name] warfarin (COUMADIN) 5 MG tablet TAKE 1 TO 1.5 TABLETS BY MOUTH AS DIRECTED BY COUMADIN CLINIC 100 tablet 1   No current facility-administered medications for this encounter.    Vitals:   11/20/19 1018  BP: 112/63  Pulse: 60  SpO2: 96%  Weight: 125.7 kg (277 lb 3.2 oz)   Wt Readings from Last 3 Encounters:  11/20/19 125.7 kg (277 lb 3.2 oz)  11/14/19 127 kg (280 lb)  08/20/19 125.9 kg (277 lb 9.6 oz)   General: NAD Neck: No JVD, no  thyromegaly or thyroid nodule.  Lungs: Clear to auscultation bilaterally with normal respiratory effort. CV: Nondisplaced PMI.  Heart regular S1/S2, no S3/S4, 2/6 SEM RUSB.  No peripheral edema.  No carotid bruit.  Normal pedal pulses.  Abdomen: Soft, nontender, no hepatosplenomegaly, no distention.  Skin: Intact without lesions or rashes.  Neurologic: Alert and oriented x 3.  Psych: Normal affect. Extremities: No clubbing or cyanosis.  HEENT: Normal.   Assessment/Plan: 1. Valvular heart disease: TEE in 9/18 showed severe MR, probably infarct-related with restrictive posterior mitral leaflet. There was moderate aortic stenosis and moderate-severe aortic insufficiency, possible rheumatic.  In 12/18, he had bioprosthetic MVR and AVR. Valves stable on 12/19 echo.  - Antibiotic prophylaxis needed with dental work.  - Repeat echo at followup in 4 months.  2. Chronic combined CHF: In setting of significant valvular disease as above. EF down to 30-35% post-op (1/19 echo), likely reflects true LV systolic function without the volume load from severe MR and moderate-severe AI.  TEE in 5/19 showed EF 25-30%.  Repeat echo in 6/19 with EF 30-35%. Repeat echo in 12/19 showed EF up to 50-55%.  He is not volume overloaded on exam.  NYHA class II symptoms.  He is only very rarely RV-pacing now.  - EF out of range for ICD but as above will repeat echo at followup in 3 months.  - Can use Lasix prn.     - Continue Coreg 12.5 mg bid.   - Continue Entresto 24/26 bid.  - Continue spironolactone 25 mg daily.  BMET today.  3. CKD: Stage 3. BMET today.  4. Atrial fibrillation/flutter: Paroxysmal. He has had a Maze procedure.  He tends to tolerate atrial arrhythmias poorly.  He had atrial flutter with DCCV in 5/19.  Recurrent atrial fibrillation in 2020, now on amiodarone and back in NSR.   - He is on warfarin. CBC today.  - Continue amiodarone 200 mg daily, can decrease to 100 mg daily today.  Check LFTs, TSH today.   He will need an eye exam, discussed today.  He had recent PFTs.  5. COPD: Mild obstruction on 10/20 PFTs, decreased DLCO.  He has quit smoking.  Stable. No change.   6. CVA: Remote, minimal residual. S/p carotid stenting.  - No ASA given warfarin use.  7. Hyperlipidemia: Good lipids in 5/20.      8. Anemia: Fe deficiency.   - CBC today.  9. 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  The artery to the PDA was occluded with left to right collaterals.  He had SVG-PDA in 12/18. No chest pain.  - Continue statin.  Good lipids in 5/20.    - No ASA given warfarin use.  10. CHB: has Medtronic PPM.  Minimal RV pacing now.  11. OSA: Daytime sleepiness.  Had remote sleep study, had OSA but did not want CPAP.  We discussed linkage of OSA and atrial fibrillation, he is willing to use CPAP if needed.  - Unable to get a home sleep study.  He does not want to come for in-lab study at this time.  Will reassess in the future.  12. Obesity: We discussed diet/exercise for weight loss.  13. HTN: BP controlled.   Followup in 4 months with echo.   Brandon [name] 11/20/2019

## 2019-11-20 NOTE — Patient Instructions (Signed)
DECREASE Amiodarone to 100mg  daily.  Routine lab work today. Will notify you of abnormal results  Follow up and echo in 4 months (please call our office in January to schedule your April appointment)

## 2019-11-22 DIAGNOSIS — J3489 Other specified disorders of nose and nasal sinuses: Secondary | ICD-10-CM | POA: Diagnosis not present

## 2019-11-25 ENCOUNTER — Telehealth: Payer: Self-pay | Admitting: *Deleted

## 2019-11-25 NOTE — Telephone Encounter (Signed)
Wife called and left a message that the pt's Amio has been reduced to 100mg  daily from 200mg  daily on 11/20/2019. Also, the pt has been put on Montelukast 10mg  qd-which is fine to take with warfarin, Azelastrine Nasl Spary HCI-which is fine to take which warfarin.  Returned call to the pt/wife and she stated the pt missed this Saturday's dose so instructed pt to take an extra 1/2 tablet today, so the pt will take 1.5 tablets today and she verbalized understanding.

## 2019-11-28 ENCOUNTER — Other Ambulatory Visit: Payer: Self-pay

## 2019-11-28 ENCOUNTER — Ambulatory Visit (INDEPENDENT_AMBULATORY_CARE_PROVIDER_SITE_OTHER): Payer: Medicare HMO | Admitting: *Deleted

## 2019-11-28 DIAGNOSIS — Z8679 Personal history of other diseases of the circulatory system: Secondary | ICD-10-CM

## 2019-11-28 DIAGNOSIS — Z953 Presence of xenogenic heart valve: Secondary | ICD-10-CM | POA: Diagnosis not present

## 2019-11-28 DIAGNOSIS — Z9889 Other specified postprocedural states: Secondary | ICD-10-CM | POA: Diagnosis not present

## 2019-11-28 DIAGNOSIS — Z951 Presence of aortocoronary bypass graft: Secondary | ICD-10-CM

## 2019-11-28 DIAGNOSIS — I480 Paroxysmal atrial fibrillation: Secondary | ICD-10-CM | POA: Diagnosis not present

## 2019-11-28 DIAGNOSIS — Z5181 Encounter for therapeutic drug level monitoring: Secondary | ICD-10-CM | POA: Diagnosis not present

## 2019-11-28 LAB — POCT INR: INR: 2.1 (ref 2.0–3.0)

## 2019-11-28 NOTE — Patient Instructions (Signed)
Description   Continue taking 1 tablet daily except for 1/2 tablet on Tuesdays, Thursdays, and Sundays. Recheck INR in 5 weeks.  Call with any new medications, scheduled procedures or any questions  [phone]     

## 2019-12-03 MED FILL — ENTRESTO 24 MG-26 MG TABLET: 24-26 | 30 days supply | Qty: 60 | Fill #2

## 2019-12-05 ENCOUNTER — Ambulatory Visit (INDEPENDENT_AMBULATORY_CARE_PROVIDER_SITE_OTHER): Payer: Medicare HMO | Admitting: *Deleted

## 2019-12-05 DIAGNOSIS — I442 Atrioventricular block, complete: Secondary | ICD-10-CM | POA: Diagnosis not present

## 2019-12-05 LAB — CUP PACEART REMOTE DEVICE CHECK
Battery Remaining Longevity: 132 mo
Battery Voltage: 3.02 V
Brady Statistic AP VP Percent: 0.07 %
Brady Statistic AP VS Percent: 88.96 %
Brady Statistic AS VP Percent: 0.04 %
Brady Statistic AS VS Percent: 10.93 %
Brady Statistic RA Percent Paced: 89.08 %
Brady Statistic RV Percent Paced: 0.11 %
Date Time Interrogation Session: 20201224010539
Implantable Lead Implant Date: 20181211
Implantable Lead Implant Date: 20181211
Implantable Lead Location: 753859
Implantable Lead Location: 753860
Implantable Lead Model: 3830
Implantable Lead Model: 5076
Implantable Pulse Generator Implant Date: 20181211
Lead Channel Impedance Value: 266 Ohm
Lead Channel Impedance Value: 323 Ohm
Lead Channel Impedance Value: 399 Ohm
Lead Channel Impedance Value: 475 Ohm
Lead Channel Pacing Threshold Amplitude: 0.625 V
Lead Channel Pacing Threshold Amplitude: 1.25 V
Lead Channel Pacing Threshold Pulse Width: 0.4 ms
Lead Channel Pacing Threshold Pulse Width: 0.4 ms
Lead Channel Sensing Intrinsic Amplitude: 4.5 mV
Lead Channel Sensing Intrinsic Amplitude: 4.5 mV
Lead Channel Sensing Intrinsic Amplitude: 5.5 mV
Lead Channel Sensing Intrinsic Amplitude: 5.5 mV
Lead Channel Setting Pacing Amplitude: 2 V
Lead Channel Setting Pacing Amplitude: 2.5 V
Lead Channel Setting Pacing Pulse Width: 1 ms
Lead Channel Setting Sensing Sensitivity: 1.2 mV

## 2019-12-07 NOTE — Progress Notes (Signed)
PPM remote 

## 2019-12-17 ENCOUNTER — Other Ambulatory Visit: Payer: Self-pay | Admitting: Internal Medicine

## 2019-12-23 ENCOUNTER — Other Ambulatory Visit (HOSPITAL_COMMUNITY): Payer: Self-pay | Admitting: Cardiology

## 2019-12-23 DIAGNOSIS — I5022 Chronic systolic (congestive) heart failure: Secondary | ICD-10-CM

## 2020-01-02 ENCOUNTER — Other Ambulatory Visit: Payer: Self-pay

## 2020-01-02 ENCOUNTER — Ambulatory Visit (INDEPENDENT_AMBULATORY_CARE_PROVIDER_SITE_OTHER): Payer: Medicare HMO | Admitting: *Deleted

## 2020-01-02 DIAGNOSIS — Z951 Presence of aortocoronary bypass graft: Secondary | ICD-10-CM

## 2020-01-02 DIAGNOSIS — Z9889 Other specified postprocedural states: Secondary | ICD-10-CM

## 2020-01-02 DIAGNOSIS — I480 Paroxysmal atrial fibrillation: Secondary | ICD-10-CM

## 2020-01-02 DIAGNOSIS — Z8679 Personal history of other diseases of the circulatory system: Secondary | ICD-10-CM

## 2020-01-02 DIAGNOSIS — Z5181 Encounter for therapeutic drug level monitoring: Secondary | ICD-10-CM

## 2020-01-02 DIAGNOSIS — Z953 Presence of xenogenic heart valve: Secondary | ICD-10-CM | POA: Diagnosis not present

## 2020-01-02 LAB — POCT INR: INR: 2.9 (ref 2.0–3.0)

## 2020-01-02 NOTE — Patient Instructions (Signed)
Description   Continue taking 1 tablet daily except for 1/2 tablet on Tuesdays, Thursdays, and Sundays. Recheck INR in 6 weeks.  Call with any new medications, scheduled procedures or any questions  [phone]

## 2020-01-03 MED FILL — ENTRESTO 24 MG-26 MG TABLET: 24-26 | 30 days supply | Qty: 60 | Fill #3

## 2020-01-23 ENCOUNTER — Ambulatory Visit: Payer: Medicare HMO

## 2020-02-03 MED FILL — ENTRESTO 24 MG-26 MG TABLET: 24-26 | 30 days supply | Qty: 60 | Fill #4

## 2020-02-04 ENCOUNTER — Telehealth (HOSPITAL_COMMUNITY): Payer: Self-pay | Admitting: Licensed Clinical Social Worker

## 2020-02-04 NOTE — Telephone Encounter (Signed)
CSW received call from pt wife inquiring about renewing PAN foundation.  CSW looked at [facility] and they still have $88 left- CSW confirmed with PAN rep that we cannot reapply until the patient is down to $0 left.  CSW called pt wife and updated- will likely be 2 more months until his grant fund is depleted- CSW encouraged pt wife to call back when they fill the grant 2 more times so we can evaluate if the grant is still open or if we need to try applying for [employer].  CSW will continue to follow and assist as needed  Jorge Ny, [place] [facility] Desk#: [phone] Cell#: [phone]

## 2020-02-06 ENCOUNTER — Ambulatory Visit: Payer: Medicare HMO

## 2020-02-06 ENCOUNTER — Ambulatory Visit: Payer: Medicare HMO | Attending: Internal Medicine

## 2020-02-06 DIAGNOSIS — Z23 Encounter for immunization: Secondary | ICD-10-CM | POA: Insufficient documentation

## 2020-02-06 NOTE — Progress Notes (Signed)
   Covid-19 Vaccination Clinic  Name:  OLALEKAN LEHTINEN    MRN: [id number] DOB: Oct 23, 1946  02/06/2020  Mr. Dechert was observed post Covid-19 immunization for 30 minutes based on pre-vaccination screening without incidence. He was provided with Vaccine Information Sheet and instruction to access the V-Safe system.   Mr. Sabori was instructed to call 911 with any severe reactions post vaccine: [name] Difficulty breathing  . Swelling of your face and throat  . A fast heartbeat  . A bad rash all over your body  . Dizziness and weakness    Immunizations Administered    Name Date Dose VIS Date Route   Pfizer COVID-19 Vaccine 02/06/2020 11:43 AM 0.3 mL 11/22/2019 Intramuscular   Manufacturer: [place]   Lot: [id number]   [place]: [id number]

## 2020-02-13 ENCOUNTER — Ambulatory Visit (INDEPENDENT_AMBULATORY_CARE_PROVIDER_SITE_OTHER): Payer: Medicare HMO | Admitting: *Deleted

## 2020-02-13 ENCOUNTER — Other Ambulatory Visit: Payer: Self-pay

## 2020-02-13 DIAGNOSIS — I480 Paroxysmal atrial fibrillation: Secondary | ICD-10-CM

## 2020-02-13 DIAGNOSIS — Z9889 Other specified postprocedural states: Secondary | ICD-10-CM

## 2020-02-13 DIAGNOSIS — Z5181 Encounter for therapeutic drug level monitoring: Secondary | ICD-10-CM | POA: Diagnosis not present

## 2020-02-13 DIAGNOSIS — Z953 Presence of xenogenic heart valve: Secondary | ICD-10-CM | POA: Diagnosis not present

## 2020-02-13 DIAGNOSIS — Z951 Presence of aortocoronary bypass graft: Secondary | ICD-10-CM

## 2020-02-13 DIAGNOSIS — Z8679 Personal history of other diseases of the circulatory system: Secondary | ICD-10-CM

## 2020-02-13 LAB — POCT INR: INR: 2.5 (ref 2.0–3.0)

## 2020-02-13 NOTE — Patient Instructions (Signed)
Description   Continue taking 1 tablet daily except for 1/2 tablet on Tuesdays, Thursdays, and Sundays. Recheck INR in 7 weeks.  Call with any new medications, scheduled procedures or any questions  [phone]

## 2020-03-03 MED FILL — ENTRESTO 24 MG-26 MG TABLET: 24-26 | 30 days supply | Qty: 60 | Fill #5

## 2020-03-04 ENCOUNTER — Ambulatory Visit: Payer: Medicare HMO | Attending: Internal Medicine

## 2020-03-04 DIAGNOSIS — Z23 Encounter for immunization: Secondary | ICD-10-CM

## 2020-03-04 NOTE — Progress Notes (Signed)
   Covid-19 Vaccination Clinic  Name:  RODDRICK SHARRON    MRN: [id number] DOB: 1946-03-03  03/04/2020  Mr. Mangold was observed post Covid-19 immunization for 30 minutes based on pre-vaccination screening without incident. He was provided with Vaccine Information Sheet and instruction to access the V-Safe system.   Mr. Mathey was instructed to call 911 with any severe reactions post vaccine: [name] Difficulty breathing  . Swelling of face and throat  . A fast heartbeat  . A bad rash all over body  . Dizziness and weakness   Immunizations Administered    Name Date Dose VIS Date Route   Pfizer COVID-19 Vaccine 03/04/2020 11:52 AM 0.3 mL 11/22/2019 Intramuscular   Manufacturer: [place]   Lot: [id number]   [place]: [id number]

## 2020-03-05 ENCOUNTER — Ambulatory Visit (INDEPENDENT_AMBULATORY_CARE_PROVIDER_SITE_OTHER): Payer: Medicare HMO | Admitting: *Deleted

## 2020-03-05 DIAGNOSIS — I442 Atrioventricular block, complete: Secondary | ICD-10-CM | POA: Diagnosis not present

## 2020-03-05 LAB — CUP PACEART REMOTE DEVICE CHECK
Battery Remaining Longevity: 127 mo
Battery Voltage: 3.02 V
Brady Statistic AP VP Percent: 0.06 %
Brady Statistic AP VS Percent: 59.44 %
Brady Statistic AS VP Percent: 0.05 %
Brady Statistic AS VS Percent: 40.45 %
Brady Statistic RA Percent Paced: 59.71 %
Brady Statistic RV Percent Paced: 0.11 %
Date Time Interrogation Session: 20210325020943
Implantable Lead Implant Date: 20181211
Implantable Lead Implant Date: 20181211
Implantable Lead Location: 753859
Implantable Lead Location: 753860
Implantable Lead Model: 3830
Implantable Lead Model: 5076
Implantable Pulse Generator Implant Date: 20181211
Lead Channel Impedance Value: 266 Ohm
Lead Channel Impedance Value: 323 Ohm
Lead Channel Impedance Value: 380 Ohm
Lead Channel Impedance Value: 380 Ohm
Lead Channel Pacing Threshold Amplitude: 0.625 V
Lead Channel Pacing Threshold Amplitude: 1.25 V
Lead Channel Pacing Threshold Pulse Width: 0.4 ms
Lead Channel Pacing Threshold Pulse Width: 0.4 ms
Lead Channel Sensing Intrinsic Amplitude: 3.25 mV
Lead Channel Sensing Intrinsic Amplitude: 3.25 mV
Lead Channel Sensing Intrinsic Amplitude: 4.25 mV
Lead Channel Sensing Intrinsic Amplitude: 4.25 mV
Lead Channel Setting Pacing Amplitude: 2 V
Lead Channel Setting Pacing Amplitude: 2.5 V
Lead Channel Setting Pacing Pulse Width: 1 ms
Lead Channel Setting Sensing Sensitivity: 1.2 mV

## 2020-03-05 NOTE — Progress Notes (Signed)
PPM Remote  

## 2020-03-30 MED FILL — ENTRESTO 24 MG-26 MG TABLET: 24-26 | 30 days supply | Qty: 60 | Fill #6

## 2020-04-02 ENCOUNTER — Other Ambulatory Visit: Payer: Self-pay

## 2020-04-02 ENCOUNTER — Ambulatory Visit (INDEPENDENT_AMBULATORY_CARE_PROVIDER_SITE_OTHER): Payer: Medicare HMO

## 2020-04-02 DIAGNOSIS — I480 Paroxysmal atrial fibrillation: Secondary | ICD-10-CM | POA: Diagnosis not present

## 2020-04-02 DIAGNOSIS — Z951 Presence of aortocoronary bypass graft: Secondary | ICD-10-CM

## 2020-04-02 DIAGNOSIS — Z8679 Personal history of other diseases of the circulatory system: Secondary | ICD-10-CM

## 2020-04-02 DIAGNOSIS — Z953 Presence of xenogenic heart valve: Secondary | ICD-10-CM

## 2020-04-02 DIAGNOSIS — Z5181 Encounter for therapeutic drug level monitoring: Secondary | ICD-10-CM

## 2020-04-02 DIAGNOSIS — Z9889 Other specified postprocedural states: Secondary | ICD-10-CM

## 2020-04-02 LAB — POCT INR: INR: 3.9 — AB (ref 2.0–3.0)

## 2020-04-02 NOTE — Patient Instructions (Signed)
Description   Skip today's dosage of Coumadin, then resume same dosage 1 tablet daily except for 1/2 tablet on Tuesdays, Thursdays, and Sundays. Recheck INR in 3 weeks.  Call with any new medications, scheduled procedures or any questions  [phone]

## 2020-04-06 DIAGNOSIS — N183 Chronic kidney disease, stage 3 unspecified: Secondary | ICD-10-CM | POA: Diagnosis not present

## 2020-04-06 DIAGNOSIS — I129 Hypertensive chronic kidney disease with stage 1 through stage 4 chronic kidney disease, or unspecified chronic kidney disease: Secondary | ICD-10-CM | POA: Diagnosis not present

## 2020-04-06 DIAGNOSIS — I480 Paroxysmal atrial fibrillation: Secondary | ICD-10-CM | POA: Diagnosis not present

## 2020-04-06 DIAGNOSIS — D631 Anemia in chronic kidney disease: Secondary | ICD-10-CM | POA: Diagnosis not present

## 2020-04-23 ENCOUNTER — Other Ambulatory Visit: Payer: Self-pay

## 2020-04-23 ENCOUNTER — Ambulatory Visit (INDEPENDENT_AMBULATORY_CARE_PROVIDER_SITE_OTHER): Payer: Medicare HMO | Admitting: *Deleted

## 2020-04-23 DIAGNOSIS — Z953 Presence of xenogenic heart valve: Secondary | ICD-10-CM

## 2020-04-23 DIAGNOSIS — Z8679 Personal history of other diseases of the circulatory system: Secondary | ICD-10-CM | POA: Diagnosis not present

## 2020-04-23 DIAGNOSIS — Z9889 Other specified postprocedural states: Secondary | ICD-10-CM

## 2020-04-23 DIAGNOSIS — Z951 Presence of aortocoronary bypass graft: Secondary | ICD-10-CM

## 2020-04-23 DIAGNOSIS — Z5181 Encounter for therapeutic drug level monitoring: Secondary | ICD-10-CM | POA: Diagnosis not present

## 2020-04-23 DIAGNOSIS — I480 Paroxysmal atrial fibrillation: Secondary | ICD-10-CM | POA: Diagnosis not present

## 2020-04-23 LAB — POCT INR: INR: 2.6 (ref 2.0–3.0)

## 2020-04-23 NOTE — Patient Instructions (Signed)
Description   Continue taking 1 tablet daily except for 1/2 tablet on Tuesdays, Thursdays, and Sundays. Recheck INR in 4 weeks.  Call with any new medications, scheduled procedures or any questions  [phone]

## 2020-04-24 ENCOUNTER — Other Ambulatory Visit (HOSPITAL_COMMUNITY): Payer: Self-pay | Admitting: Cardiology

## 2020-05-07 ENCOUNTER — Other Ambulatory Visit: Payer: Self-pay

## 2020-05-07 MED ORDER — WARFARIN SODIUM 5 MG PO TABS: ORAL_TABLET | ORAL | 1 refills | Status: DC

## 2020-05-18 ENCOUNTER — Other Ambulatory Visit: Payer: Self-pay | Admitting: *Deleted

## 2020-05-18 MED ORDER — WARFARIN SODIUM 5 MG PO TABS: ORAL_TABLET | ORAL | 0 refills | Status: DC

## 2020-05-25 ENCOUNTER — Other Ambulatory Visit (HOSPITAL_COMMUNITY): Payer: Self-pay | Admitting: Physician Assistant

## 2020-05-26 ENCOUNTER — Ambulatory Visit (INDEPENDENT_AMBULATORY_CARE_PROVIDER_SITE_OTHER): Payer: Medicare HMO | Admitting: *Deleted

## 2020-05-26 ENCOUNTER — Other Ambulatory Visit: Payer: Self-pay

## 2020-05-26 DIAGNOSIS — Z5181 Encounter for therapeutic drug level monitoring: Secondary | ICD-10-CM | POA: Diagnosis not present

## 2020-05-26 DIAGNOSIS — Z951 Presence of aortocoronary bypass graft: Secondary | ICD-10-CM | POA: Diagnosis not present

## 2020-05-26 DIAGNOSIS — Z953 Presence of xenogenic heart valve: Secondary | ICD-10-CM

## 2020-05-26 DIAGNOSIS — I480 Paroxysmal atrial fibrillation: Secondary | ICD-10-CM

## 2020-05-26 DIAGNOSIS — Z9889 Other specified postprocedural states: Secondary | ICD-10-CM

## 2020-05-26 DIAGNOSIS — Z8679 Personal history of other diseases of the circulatory system: Secondary | ICD-10-CM

## 2020-05-26 LAB — POCT INR: INR: 3 (ref 2.0–3.0)

## 2020-05-26 NOTE — Patient Instructions (Signed)
Description   Continue taking 1 tablet daily except for 1/2 tablet on Tuesdays, Thursdays, and Sundays. Recheck INR in 5 weeks.  Call with any new medications, scheduled procedures or any questions  [phone]

## 2020-06-04 ENCOUNTER — Ambulatory Visit (INDEPENDENT_AMBULATORY_CARE_PROVIDER_SITE_OTHER): Payer: Medicare HMO | Admitting: *Deleted

## 2020-06-04 DIAGNOSIS — I442 Atrioventricular block, complete: Secondary | ICD-10-CM

## 2020-06-04 LAB — CUP PACEART REMOTE DEVICE CHECK
Battery Remaining Longevity: 123 mo
Battery Voltage: 3.02 V
Brady Statistic AP VP Percent: 0.08 %
Brady Statistic AP VS Percent: 73.77 %
Brady Statistic AS VP Percent: 0.05 %
Brady Statistic AS VS Percent: 26.1 %
Brady Statistic RA Percent Paced: 73.97 %
Brady Statistic RV Percent Paced: 0.13 %
Date Time Interrogation Session: 20210624112328
Implantable Lead Implant Date: 20181211
Implantable Lead Implant Date: 20181211
Implantable Lead Location: 753859
Implantable Lead Location: 753860
Implantable Lead Model: 3830
Implantable Lead Model: 5076
Implantable Pulse Generator Implant Date: 20181211
Lead Channel Impedance Value: 285 Ohm
Lead Channel Impedance Value: 323 Ohm
Lead Channel Impedance Value: 380 Ohm
Lead Channel Impedance Value: 399 Ohm
Lead Channel Pacing Threshold Amplitude: 0.625 V
Lead Channel Pacing Threshold Amplitude: 1.375 V
Lead Channel Pacing Threshold Pulse Width: 0.4 ms
Lead Channel Pacing Threshold Pulse Width: 0.4 ms
Lead Channel Sensing Intrinsic Amplitude: 3.625 mV
Lead Channel Sensing Intrinsic Amplitude: 3.625 mV
Lead Channel Sensing Intrinsic Amplitude: 4.5 mV
Lead Channel Sensing Intrinsic Amplitude: 4.5 mV
Lead Channel Setting Pacing Amplitude: 2 V
Lead Channel Setting Pacing Amplitude: 2.5 V
Lead Channel Setting Pacing Pulse Width: 1 ms
Lead Channel Setting Sensing Sensitivity: 1.2 mV

## 2020-06-05 NOTE — Progress Notes (Signed)
Remote pacemaker transmission.   

## 2020-06-12 ENCOUNTER — Other Ambulatory Visit (HOSPITAL_COMMUNITY): Payer: Self-pay | Admitting: Cardiology

## 2020-06-30 ENCOUNTER — Other Ambulatory Visit: Payer: Self-pay

## 2020-06-30 ENCOUNTER — Ambulatory Visit (INDEPENDENT_AMBULATORY_CARE_PROVIDER_SITE_OTHER): Payer: Medicare HMO | Admitting: *Deleted

## 2020-06-30 DIAGNOSIS — I480 Paroxysmal atrial fibrillation: Secondary | ICD-10-CM | POA: Diagnosis not present

## 2020-06-30 DIAGNOSIS — Z951 Presence of aortocoronary bypass graft: Secondary | ICD-10-CM | POA: Diagnosis not present

## 2020-06-30 DIAGNOSIS — Z953 Presence of xenogenic heart valve: Secondary | ICD-10-CM

## 2020-06-30 DIAGNOSIS — Z5181 Encounter for therapeutic drug level monitoring: Secondary | ICD-10-CM

## 2020-06-30 DIAGNOSIS — Z8679 Personal history of other diseases of the circulatory system: Secondary | ICD-10-CM | POA: Diagnosis not present

## 2020-06-30 DIAGNOSIS — Z9889 Other specified postprocedural states: Secondary | ICD-10-CM | POA: Diagnosis not present

## 2020-06-30 LAB — POCT INR: INR: 3.6 — AB (ref 2.0–3.0)

## 2020-06-30 NOTE — Patient Instructions (Addendum)
  Description   Do not take any Warfarin today then continue taking 1 tablet daily except for 1/2 tablet on Tuesdays, Thursdays, and Sundays. Recheck INR in 3 weeks.  Call with any new medications, scheduled procedures or any questions  [phone]

## 2020-07-03 ENCOUNTER — Telehealth (HOSPITAL_COMMUNITY): Payer: Self-pay | Admitting: Licensed Clinical Social Worker

## 2020-07-03 NOTE — Telephone Encounter (Signed)
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  CSW informed spouse that the grant is currently closed but CSW put pt on the waitlist for if it opens back up.  Discussed back up option of applying for [employer] and CSW will mail out to pt and wife to complete.  Pt wife reports they just paid for a 3 month supply so they should have plenty of medication until October.  No further needs at this time  Jorge Ny, [facility] Desk#: [phone] Cell#: [phone]

## 2020-07-04 ENCOUNTER — Other Ambulatory Visit (HOSPITAL_COMMUNITY): Payer: Self-pay | Admitting: Cardiology

## 2020-07-21 ENCOUNTER — Ambulatory Visit (INDEPENDENT_AMBULATORY_CARE_PROVIDER_SITE_OTHER): Payer: Medicare HMO | Admitting: Pharmacist

## 2020-07-21 ENCOUNTER — Other Ambulatory Visit: Payer: Self-pay

## 2020-07-21 DIAGNOSIS — Z8679 Personal history of other diseases of the circulatory system: Secondary | ICD-10-CM | POA: Diagnosis not present

## 2020-07-21 DIAGNOSIS — Z953 Presence of xenogenic heart valve: Secondary | ICD-10-CM

## 2020-07-21 DIAGNOSIS — I480 Paroxysmal atrial fibrillation: Secondary | ICD-10-CM

## 2020-07-21 DIAGNOSIS — Z9889 Other specified postprocedural states: Secondary | ICD-10-CM

## 2020-07-21 DIAGNOSIS — Z5181 Encounter for therapeutic drug level monitoring: Secondary | ICD-10-CM

## 2020-07-21 DIAGNOSIS — Z951 Presence of aortocoronary bypass graft: Secondary | ICD-10-CM

## 2020-07-21 LAB — POCT INR: INR: 3.5 — AB (ref 2.0–3.0)

## 2020-07-21 NOTE — Patient Instructions (Signed)
Description   Skip your warfarin today, then start taking 1/2 tablet daily except for 1 tablet on Mondays, Wednesdays, and Fridays. Recheck INR in 3 weeks.  Call with any new medications, scheduled procedures or any questions  [phone]

## 2020-07-30 DIAGNOSIS — I5032 Chronic diastolic (congestive) heart failure: Secondary | ICD-10-CM | POA: Diagnosis not present

## 2020-07-30 DIAGNOSIS — I739 Peripheral vascular disease, unspecified: Secondary | ICD-10-CM | POA: Diagnosis not present

## 2020-07-30 DIAGNOSIS — I350 Nonrheumatic aortic (valve) stenosis: Secondary | ICD-10-CM | POA: Diagnosis not present

## 2020-07-30 DIAGNOSIS — Z6841 Body Mass Index (BMI) 40.0 and over, adult: Secondary | ICD-10-CM | POA: Diagnosis not present

## 2020-07-30 DIAGNOSIS — I10 Essential (primary) hypertension: Secondary | ICD-10-CM | POA: Diagnosis not present

## 2020-07-30 DIAGNOSIS — Z8673 Personal history of transient ischemic attack (TIA), and cerebral infarction without residual deficits: Secondary | ICD-10-CM | POA: Diagnosis not present

## 2020-07-30 DIAGNOSIS — F3342 Major depressive disorder, recurrent, in full remission: Secondary | ICD-10-CM | POA: Diagnosis not present

## 2020-07-30 DIAGNOSIS — I679 Cerebrovascular disease, unspecified: Secondary | ICD-10-CM | POA: Diagnosis not present

## 2020-07-30 DIAGNOSIS — Z0000 Encounter for general adult medical examination without abnormal findings: Secondary | ICD-10-CM | POA: Diagnosis not present

## 2020-07-30 DIAGNOSIS — I480 Paroxysmal atrial fibrillation: Secondary | ICD-10-CM | POA: Diagnosis not present

## 2020-07-30 DIAGNOSIS — R7303 Prediabetes: Secondary | ICD-10-CM | POA: Diagnosis not present

## 2020-07-30 DIAGNOSIS — E782 Mixed hyperlipidemia: Secondary | ICD-10-CM | POA: Diagnosis not present

## 2020-07-30 DIAGNOSIS — J439 Emphysema, unspecified: Secondary | ICD-10-CM | POA: Diagnosis not present

## 2020-08-06 ENCOUNTER — Other Ambulatory Visit: Payer: Self-pay

## 2020-08-06 ENCOUNTER — Ambulatory Visit (INDEPENDENT_AMBULATORY_CARE_PROVIDER_SITE_OTHER): Payer: Medicare HMO | Admitting: Pharmacist

## 2020-08-06 DIAGNOSIS — I480 Paroxysmal atrial fibrillation: Secondary | ICD-10-CM

## 2020-08-06 DIAGNOSIS — Z953 Presence of xenogenic heart valve: Secondary | ICD-10-CM

## 2020-08-06 DIAGNOSIS — Z9889 Other specified postprocedural states: Secondary | ICD-10-CM | POA: Diagnosis not present

## 2020-08-06 DIAGNOSIS — Z8679 Personal history of other diseases of the circulatory system: Secondary | ICD-10-CM | POA: Diagnosis not present

## 2020-08-06 DIAGNOSIS — Z5181 Encounter for therapeutic drug level monitoring: Secondary | ICD-10-CM

## 2020-08-06 DIAGNOSIS — Z951 Presence of aortocoronary bypass graft: Secondary | ICD-10-CM

## 2020-08-06 LAB — POCT INR: INR: 2.2 (ref 2.0–3.0)

## 2020-08-06 NOTE — Patient Instructions (Signed)
Continue taking 1/2 tablet daily except for 1 tablet on Mondays, Wednesdays, and Fridays. Recheck INR in 4 weeks.  Call with any new medications, scheduled procedures or any questions  [phone]

## 2020-08-27 ENCOUNTER — Other Ambulatory Visit (HOSPITAL_COMMUNITY): Payer: Self-pay | Admitting: Physician Assistant

## 2020-08-27 MED ORDER — AMIODARONE HCL 200 MG PO TABS
100.0000 mg | ORAL_TABLET | Freq: Every day | ORAL | 0 refills | Status: DC
Start: 2020-08-27 — End: 2020-09-04

## 2020-09-03 ENCOUNTER — Ambulatory Visit (INDEPENDENT_AMBULATORY_CARE_PROVIDER_SITE_OTHER): Payer: Medicare HMO | Admitting: Emergency Medicine

## 2020-09-03 ENCOUNTER — Other Ambulatory Visit: Payer: Self-pay

## 2020-09-03 ENCOUNTER — Ambulatory Visit (INDEPENDENT_AMBULATORY_CARE_PROVIDER_SITE_OTHER): Payer: Medicare HMO

## 2020-09-03 DIAGNOSIS — Z5181 Encounter for therapeutic drug level monitoring: Secondary | ICD-10-CM

## 2020-09-03 DIAGNOSIS — Z8679 Personal history of other diseases of the circulatory system: Secondary | ICD-10-CM | POA: Diagnosis not present

## 2020-09-03 DIAGNOSIS — I480 Paroxysmal atrial fibrillation: Secondary | ICD-10-CM | POA: Diagnosis not present

## 2020-09-03 DIAGNOSIS — Z951 Presence of aortocoronary bypass graft: Secondary | ICD-10-CM | POA: Diagnosis not present

## 2020-09-03 DIAGNOSIS — Z9889 Other specified postprocedural states: Secondary | ICD-10-CM | POA: Diagnosis not present

## 2020-09-03 DIAGNOSIS — I442 Atrioventricular block, complete: Secondary | ICD-10-CM

## 2020-09-03 DIAGNOSIS — Z953 Presence of xenogenic heart valve: Secondary | ICD-10-CM | POA: Diagnosis not present

## 2020-09-03 LAB — POCT INR: INR: 2.1 (ref 2.0–3.0)

## 2020-09-03 LAB — CUP PACEART REMOTE DEVICE CHECK
Battery Remaining Longevity: 119 mo
Battery Voltage: 3.02 V
Brady Statistic AP VP Percent: 0.08 %
Brady Statistic AP VS Percent: 72.87 %
Brady Statistic AS VP Percent: 0.05 %
Brady Statistic AS VS Percent: 27 %
Brady Statistic RA Percent Paced: 71.89 %
Brady Statistic RV Percent Paced: 0.18 %
Date Time Interrogation Session: 20210923115733
Implantable Lead Implant Date: 20181211
Implantable Lead Implant Date: 20181211
Implantable Lead Location: 753859
Implantable Lead Location: 753860
Implantable Lead Model: 3830
Implantable Lead Model: 5076
Implantable Pulse Generator Implant Date: 20181211
Lead Channel Impedance Value: 266 Ohm
Lead Channel Impedance Value: 323 Ohm
Lead Channel Impedance Value: 361 Ohm
Lead Channel Impedance Value: 380 Ohm
Lead Channel Pacing Threshold Amplitude: 0.625 V
Lead Channel Pacing Threshold Amplitude: 1.5 V
Lead Channel Pacing Threshold Pulse Width: 0.4 ms
Lead Channel Pacing Threshold Pulse Width: 0.4 ms
Lead Channel Sensing Intrinsic Amplitude: 3.125 mV
Lead Channel Sensing Intrinsic Amplitude: 3.125 mV
Lead Channel Sensing Intrinsic Amplitude: 4.375 mV
Lead Channel Sensing Intrinsic Amplitude: 4.375 mV
Lead Channel Setting Pacing Amplitude: 2 V
Lead Channel Setting Pacing Amplitude: 2.5 V
Lead Channel Setting Pacing Pulse Width: 1 ms
Lead Channel Setting Sensing Sensitivity: 1.2 mV

## 2020-09-03 NOTE — Patient Instructions (Signed)
Description   Continue taking 1/2 tablet daily except for 1 tablet on Mondays, Wednesdays, and Fridays. Recheck INR in 5 weeks.  Call with any new medications, scheduled procedures or any questions  [phone]

## 2020-09-04 ENCOUNTER — Encounter (HOSPITAL_COMMUNITY): Payer: Self-pay

## 2020-09-04 MED ORDER — AMIODARONE HCL 100 MG PO TABS
100.0000 mg | ORAL_TABLET | Freq: Every day | ORAL | 0 refills | Status: DC
Start: 2020-09-04 — End: 2021-02-25

## 2020-09-08 NOTE — Progress Notes (Signed)
Remote pacemaker transmission.   

## 2020-09-28 DIAGNOSIS — I129 Hypertensive chronic kidney disease with stage 1 through stage 4 chronic kidney disease, or unspecified chronic kidney disease: Secondary | ICD-10-CM | POA: Diagnosis not present

## 2020-09-28 DIAGNOSIS — N183 Chronic kidney disease, stage 3 unspecified: Secondary | ICD-10-CM | POA: Diagnosis not present

## 2020-09-28 DIAGNOSIS — I480 Paroxysmal atrial fibrillation: Secondary | ICD-10-CM | POA: Diagnosis not present

## 2020-10-08 ENCOUNTER — Ambulatory Visit (INDEPENDENT_AMBULATORY_CARE_PROVIDER_SITE_OTHER): Payer: Medicare HMO | Admitting: *Deleted

## 2020-10-08 ENCOUNTER — Other Ambulatory Visit: Payer: Self-pay

## 2020-10-08 DIAGNOSIS — Z5181 Encounter for therapeutic drug level monitoring: Secondary | ICD-10-CM | POA: Diagnosis not present

## 2020-10-08 DIAGNOSIS — Z953 Presence of xenogenic heart valve: Secondary | ICD-10-CM

## 2020-10-08 DIAGNOSIS — Z951 Presence of aortocoronary bypass graft: Secondary | ICD-10-CM | POA: Diagnosis not present

## 2020-10-08 DIAGNOSIS — Z8679 Personal history of other diseases of the circulatory system: Secondary | ICD-10-CM | POA: Diagnosis not present

## 2020-10-08 DIAGNOSIS — Z9889 Other specified postprocedural states: Secondary | ICD-10-CM

## 2020-10-08 LAB — POCT INR: INR: 3.6 — AB (ref 2.0–3.0)

## 2020-10-08 NOTE — Patient Instructions (Signed)
Description   Hold warfarin today and then, continue taking 1/2 tablet daily except for 1 tablet on Mondays, Wednesdays, and Fridays. Recheck INR in 3 weeks.  Call with any new medications, scheduled procedures or any questions  [phone]

## 2020-10-29 ENCOUNTER — Ambulatory Visit (HOSPITAL_BASED_OUTPATIENT_CLINIC_OR_DEPARTMENT_OTHER)
Admission: RE | Admit: 2020-10-29 | Discharge: 2020-10-29 | Disposition: A | Discharge status: Home / Self Care | Payer: Medicare HMO | Source: Ambulatory Visit | Attending: Cardiology | Admitting: Cardiology

## 2020-10-29 ENCOUNTER — Other Ambulatory Visit: Payer: Self-pay

## 2020-10-29 ENCOUNTER — Ambulatory Visit (INDEPENDENT_AMBULATORY_CARE_PROVIDER_SITE_OTHER): Payer: Medicare HMO | Admitting: *Deleted

## 2020-10-29 ENCOUNTER — Encounter (HOSPITAL_COMMUNITY): Payer: Self-pay | Admitting: Cardiology

## 2020-10-29 ENCOUNTER — Ambulatory Visit (HOSPITAL_COMMUNITY)
Admission: RE | Admit: 2020-10-29 | Discharge: 2020-10-29 | Disposition: A | Discharge status: Home / Self Care | Payer: Medicare HMO | Source: Ambulatory Visit | Attending: Cardiology | Admitting: Cardiology

## 2020-10-29 VITALS — BP 150/88 | HR 60 | Wt 282.4 lb

## 2020-10-29 DIAGNOSIS — Z953 Presence of xenogenic heart valve: Secondary | ICD-10-CM | POA: Diagnosis not present

## 2020-10-29 DIAGNOSIS — E785 Hyperlipidemia, unspecified: Secondary | ICD-10-CM

## 2020-10-29 DIAGNOSIS — Z87891 Personal history of nicotine dependence: Secondary | ICD-10-CM | POA: Insufficient documentation

## 2020-10-29 DIAGNOSIS — G4733 Obstructive sleep apnea (adult) (pediatric): Secondary | ICD-10-CM | POA: Diagnosis not present

## 2020-10-29 DIAGNOSIS — I130 Hypertensive heart and chronic kidney disease with heart failure and stage 1 through stage 4 chronic kidney disease, or unspecified chronic kidney disease: Secondary | ICD-10-CM | POA: Diagnosis not present

## 2020-10-29 DIAGNOSIS — D509 Iron deficiency anemia, unspecified: Secondary | ICD-10-CM | POA: Insufficient documentation

## 2020-10-29 DIAGNOSIS — N183 Chronic kidney disease, stage 3 unspecified: Secondary | ICD-10-CM | POA: Diagnosis not present

## 2020-10-29 DIAGNOSIS — I252 Old myocardial infarction: Secondary | ICD-10-CM | POA: Diagnosis not present

## 2020-10-29 DIAGNOSIS — I2510 Atherosclerotic heart disease of native coronary artery without angina pectoris: Secondary | ICD-10-CM | POA: Diagnosis not present

## 2020-10-29 DIAGNOSIS — Z8673 Personal history of transient ischemic attack (TIA), and cerebral infarction without residual deficits: Secondary | ICD-10-CM | POA: Insufficient documentation

## 2020-10-29 DIAGNOSIS — I480 Paroxysmal atrial fibrillation: Secondary | ICD-10-CM | POA: Insufficient documentation

## 2020-10-29 DIAGNOSIS — I5022 Chronic systolic (congestive) heart failure: Secondary | ICD-10-CM

## 2020-10-29 DIAGNOSIS — I4892 Unspecified atrial flutter: Secondary | ICD-10-CM | POA: Insufficient documentation

## 2020-10-29 DIAGNOSIS — E669 Obesity, unspecified: Secondary | ICD-10-CM | POA: Diagnosis not present

## 2020-10-29 DIAGNOSIS — Z8679 Personal history of other diseases of the circulatory system: Secondary | ICD-10-CM | POA: Diagnosis not present

## 2020-10-29 DIAGNOSIS — Z79899 Other long term (current) drug therapy: Secondary | ICD-10-CM | POA: Insufficient documentation

## 2020-10-29 DIAGNOSIS — Z5181 Encounter for therapeutic drug level monitoring: Secondary | ICD-10-CM | POA: Diagnosis not present

## 2020-10-29 DIAGNOSIS — Z7901 Long term (current) use of anticoagulants: Secondary | ICD-10-CM | POA: Diagnosis not present

## 2020-10-29 DIAGNOSIS — Z951 Presence of aortocoronary bypass graft: Secondary | ICD-10-CM | POA: Diagnosis not present

## 2020-10-29 DIAGNOSIS — R0683 Snoring: Secondary | ICD-10-CM

## 2020-10-29 DIAGNOSIS — J449 Chronic obstructive pulmonary disease, unspecified: Secondary | ICD-10-CM | POA: Diagnosis not present

## 2020-10-29 DIAGNOSIS — Z9889 Other specified postprocedural states: Secondary | ICD-10-CM | POA: Diagnosis not present

## 2020-10-29 HISTORY — DX: Heart failure, unspecified: I50.9

## 2020-10-29 HISTORY — DX: Chronic kidney disease, unspecified: N18.9

## 2020-10-29 LAB — COMPREHENSIVE METABOLIC PANEL
ALT: 11 U/L (ref 0–44)
AST: 21 U/L (ref 15–41)
Albumin: 4 g/dL (ref 3.5–5.0)
Alkaline Phosphatase: 28 U/L — ABNORMAL LOW (ref 38–126)
Anion gap: 12 (ref 5–15)
BUN: 42 mg/dL — ABNORMAL HIGH (ref 8–23)
CO2: 22 mmol/L (ref 22–32)
Calcium: 9.4 mg/dL (ref 8.9–10.3)
Chloride: 107 mmol/L (ref 98–111)
Creatinine, Ser: 2.22 mg/dL — ABNORMAL HIGH (ref 0.61–1.24)
GFR, Estimated: 30 mL/min — ABNORMAL LOW (ref >60)
Glucose, Bld: 112 mg/dL — ABNORMAL HIGH (ref 70–99)
Potassium: 4.6 mmol/L (ref 3.5–5.1)
Sodium: 141 mmol/L (ref 135–145)
Total Bilirubin: 0.6 mg/dL (ref 0.3–1.2)
Total Protein: 7.5 g/dL (ref 6.5–8.1)

## 2020-10-29 LAB — CBC
HCT: 45.2 % (ref 39.0–52.0)
Hemoglobin: 14.3 g/dL (ref 13.0–17.0)
MCH: 32.9 pg (ref 26.0–34.0)
MCHC: 31.6 g/dL (ref 30.0–36.0)
MCV: 103.9 fL — ABNORMAL HIGH (ref 80.0–100.0)
Platelets: 196 10*3/uL (ref 150–400)
RBC: 4.35 MIL/uL (ref 4.22–5.81)
RDW: 13.5 % (ref 11.5–15.5)
WBC: 7.2 10*3/uL (ref 4.0–10.5)
nRBC: 0 % (ref 0.0–0.2)

## 2020-10-29 LAB — ECHOCARDIOGRAM COMPLETE
AR max vel: 1.83 cm2
AV Area VTI: 1.87 cm2
AV Area mean vel: 1.79 cm2
AV Mean grad: 12 mmHg
AV Peak grad: 18.5 mmHg
Ao pk vel: 2.15 m/s
Area-P 1/2: 1.93 cm2
S' Lateral: 4.2 cm
Single Plane A4C EF: 53 %

## 2020-10-29 LAB — LIPID PANEL
Cholesterol: 148 mg/dL (ref 0–200)
HDL: 61 mg/dL (ref >40)
LDL Cholesterol: 54 mg/dL (ref 0–99)
Total CHOL/HDL Ratio: 2.4 RATIO
Triglycerides: 167 mg/dL — ABNORMAL HIGH (ref <150)
VLDL: 33 mg/dL (ref 0–40)

## 2020-10-29 LAB — BRAIN NATRIURETIC PEPTIDE: B Natriuretic Peptide: 206.6 pg/mL — ABNORMAL HIGH (ref 0.0–100.0)

## 2020-10-29 LAB — POCT INR: INR: 2.8 (ref 2.0–3.0)

## 2020-10-29 LAB — TSH: TSH: 3.466 u[IU]/mL (ref 0.350–4.500)

## 2020-10-29 NOTE — Patient Instructions (Signed)
Description   Continue taking 1/2 tablet daily except for 1 tablet on Mondays, Wednesdays, and Fridays. Recheck INR in 4 weeks.  Call with any new medications, scheduled procedures or any questions  [phone]

## 2020-10-29 NOTE — Progress Notes (Signed)
  Echocardiogram 2D Echocardiogram has been performed.  Brandon [name] 10/29/2020, 2:11 PM

## 2020-10-29 NOTE — Progress Notes (Signed)
ReDS Vest / Clip - 10/29/20 1500      ReDS Vest / Clip   Station Marker D    Ruler Value 40    ReDS Value Range Low volume    ReDS Actual Value 27

## 2020-10-29 NOTE — Patient Instructions (Addendum)
Labs done today, your results will be available in MyChart, we will contact you for abnormal readings.  Your physician has recommended that you have a sleep study. This test records several body functions during sleep, including: brain activity, eye movement, oxygen and carbon dioxide blood levels, heart rate and rhythm, breathing rate and rhythm, the flow of air through your mouth and nose, snoring, body muscle movements, and chest and belly movement.  Your physician has recommended that you have a pulmonary function test. Pulmonary Function Tests are a group of tests that measure how well air moves in and out of your lungs.  Your physician recommends that you schedule a follow-up appointment in: 3 months  If you have any questions or concerns before your next appointment please send [place] a message through [place] or call our office at [phone].    TO LEAVE A MESSAGE FOR THE NURSE SELECT OPTION 2, PLEASE LEAVE A MESSAGE INCLUDING: . YOUR NAME . DATE OF BIRTH . CALL BACK NUMBER . REASON FOR CALL**this is important as we prioritize the call backs  YOU WILL RECEIVE A CALL BACK THE SAME DAY AS LONG AS YOU CALL BEFORE 4:00 PM

## 2020-10-30 NOTE — Progress Notes (Signed)
Advanced Heart Failure Clinic Note   PCP: Dr. Justin Mend Cardiology: Dr. Radford Pax HF Cardiology: Dr. Aundra Dubin  74 y.o.with history of CVA, paroxysmal atrial fibrillation, valvular heart disease, and chronic diastolic CHF presents for evaluation of CHF and valvular heart disease.    He had had episodes of dyspnea and initial workup led to a TEE in 5/18 showing normal EF with moderate AS and moderate MR.  He continued to have episodic dyspnea and ended up admitted in 8/18 with shortness of breath and chest pressure.  This led to a long, complicated hospitalization.  He was noted to be volume overloaded with acute diastolic CHF and was also noted to have symptomatic runs of atrial fibrillation with RVR.  Amiodarone was started to control the atrial fibrillation.  He developed respiratory distress => Bipap => intubated.  He became hypotensive, requiring pressors.  He developed AKI.  PNA was noted and he received broad spectrum abx.  He had a prolonged intubation but was eventually extubated.  Given deconditioning from long hospitalization, he went to CIR for a couple of weeks.  LHC in 9/18 showed occluded PDA with left>right collaterals.  TEE in 9/18 showed EF 50%, severe MR (possibly infarct-related with restricted posterior leaflet, and moderate AS + moderate-severe AI (possibly rheumatic).   In 12/18, he had cardiac surgery with bioprosthetic aortic and mitral valves placed.  He had SVG-PDA, Maze, and LA appendage clipping.  Post-op course was complicated by CHB requiring Medtronic dual chamber PPM (His bundle lead placed but captured right bundle so induced LBBB).   Echo 01/02/18 LVEF 30-35%, bioprosthetic MV and AoV function normally, RV mildly dilated with normal systolic function.    He was noted to be in atrial flutter with mild RVR and had TEE-guided DCCV on 04/11/18. TEE showed EF 25-30%, normal bioprosthetic aortic and mitral valves.  Echo in 6/19 showed EF 30-35%, normal bioprosthetic aortic and mitral  valves.  Echo 12/19 with EF up to 50-55%, normal bioprosthetic mitral and aortic valves, normal RV.   In 2020, he went back into atrial fibrillation.  Amiodarone was started, and he returned to NSR.   Echo was done today and reviewed, EF 50-55%, mild LVH, mildly dilated RV with normal function, bioprosthetic mitral valve with mean gradient 5 mmHg and no MR, bioprosthetic aortic valve with mean gradient 12 and no AI.    He returns today for followup of CHF and valvular disease.  He is a-paced today.  He is short of breath after walking 200 feet.  No chest pain.  He has been sleeping in his recliner most of the time but has done this for several years.  BP elevated in the office today but SBP at home generally in the 110s range. He saw his nephrologist recently and Lasix increased to 40 mg bid.  On our scale, weight is up 5 lbs.   REDS clip 27%  ECG (personally reviewed): a-paced, poor RWP  MDT device interrogation: occasional short AF runs in October, none since.  61% a-paced.    Labs (9/18): K 4.5, creatinine 1.42, LDL 90, HDL 36, LFTs normal, TSH normal, hgb 9.6 Labs (10/18): K 4.3, creatinine 1.45 Labs (12/18): K 3.8, creatinine 1.56 Labs (1/19): creatinine 1.68, LDL 66 Labs (3/19): K 4.2, creatinine 1.56 Labs (4/19): K 4.4, creatinine 1.71, hgb 11.8, LDL 75, HDL 43 Labs (6/19): K 4.8, creatinine 2.1, LDL 75, TGs 246 Labs (8/19): K 4.6, creatinine 2.07 Labs (11/19): K 4.6, creatinine 1.29 Labs (2/20): K 4.8, creatinine 1.98  Labs (5/20): LDL 49, HDL 39 Labs (7/20): TSH normal Labs (9/20): TSH normal, K 4.4, creatinine 2.03, hgb 13.5, LFTs normal Labs (12/20): K 4.4, creatinine 1.87, hgb 13.1.   PMH: 1. CVA: Right MCA, 2004.  Minimal residual problems.  2. HTN 3. Hyperlipidemia 4. Left rotator cuff surgery 6/18 5. Carotid stenosis: s/p right carotid stent.  6. GERD 7. H/o CCY 8. Anemia: FOBT negative.  9. Gout  10. Atrial fibrillation: Paroxysmal. Maze in 12/18 with LAA  clipping.  - Recurrent atrial fibrillation 2020 => amiodarone.  11. Valvular heart disease: TEE (5/18) with EF 55-60%, moderate AS mean 33 mmHg, moderate MR, normal RV size and systolic function.  - Echo (8/18): EF 55-60%, moderate LVH, moderate AS with mean gradient 33 mmHg, moderate AI, moderate to severe MR.  - TEE (9/18): EF 50%, mild LV dilation, suspect severe MR with posterior leaflet restricted (?infarct-related MR), ERO 0.4 cm^2, moderate AS/moderate-severe AI (possibly rheumatic).   - In 12/18, he had cardiac surgery with bioprosthetic aortic and mitral valves placed.  He had SVG-PDA, Maze, and LA appendage clipping. - Echo (1/19): LVEF 30-35%, bioprosthetic MV and AoV function normally, RV mildly dilated with normal systolic function.  - TEE (5/19) with EF 25-30%, normal bioprosthetic aortic valve, normal bioprosthetic mitral valve. - Echo (6/19): EF 30-35%, mild LV dilation with diffuse hypokinesis, normal RV size with mildly decreased systolic function, normal-appearing bioprosthetic mitral and aortic valves.   - Echo (12/19): EF 50-55%, moderate LVH, bioprosthetic aortic valve with mean gradient 10 mmHg, normally-functioning bioprosthetic mitral valve.  - Echo (11/21): EF 50-55%, mild LVH, mildly dilated RV with normal function, bioprosthetic mitral valve with mean gradient 5 mmHg and no MR, bioprosthetic aortic valve with mean gradient 12 and no AI.   12. Chronic systolic CHF 13. Deafness 14. CKD: stage 3.  15. 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  17. COPD: Mild obstruction on 9/18 PFTs.  - PFTs (10/20): Mild obstruction, decreased DLCO.  18. Post-op CHB in 12/18 with Medtronic dual chamber PPM.  19. Chronic systolic CHF: Echo (0/86) with EF down to 30-35% post-op.  - TEE (5/19) with EF 25-30%, normal bioprosthetic aortic valve, normal  bioprosthetic mitral valve.  - Echo (12/19): EF 50-55%, moderate LVH, bioprosthetic aortic valve with mean gradient 10 mmHg, normally-functioning bioprosthetic mitral valve.  20. Atrial flutter: DCCV in 5/19.  21. OSA: Not using CPAP.   SH: Quit smoking 8/18.  Married, 2 children, retired.    Family History  Problem Relation Age of Onset  . Stroke Father        No details  . Angina Mother    Review of systems complete and found to be negative unless listed in HPI.    Current Outpatient Medications  Medication Sig Dispense Refill  . acetaminophen (TYLENOL) 325 MG tablet Take 1 tablet (325 mg total) by mouth every 6 (six) hours as needed for mild pain. 30 tablet 0  . amiodarone (PACERONE) 100 MG tablet Take 1 tablet (100 mg total) by mouth daily. Please make yearly appt with Dr. Lovena Le for December before anymore refills. 1st attempt 90 tablet 0  . amLODipine (NORVASC) 2.5 MG tablet TAKE 1 TABLET EVERY DAY 90 tablet 3  . calcium carbonate (TUMS - DOSED IN MG ELEMENTAL CALCIUM) 500 MG chewable tablet Chew 1 tablet by mouth daily as needed for  indigestion or heartburn.    . carvedilol (COREG) 12.5 MG tablet Take 1 tablet (12.5 mg total) by mouth 2 (two) times daily with a meal. 180 tablet 3  . cetirizine (ZYRTEC) 10 MG tablet Take 10 mg by mouth daily as needed for allergies.    . citalopram (CELEXA) 20 MG tablet Take 1 tablet (20 mg total) by mouth at bedtime. 30 tablet 5  . docusate sodium (COLACE) 100 MG capsule Take 1 capsule (100 mg total) by mouth 3 (three) times daily as needed. 20 capsule 0  . ENTRESTO 24-26 MG TAKE 1 TABLET TWICE DAILY 60 tablet 6  . furosemide (LASIX) 40 MG tablet Take 40 mg by mouth 2 (two) times daily.    . methocarbamol (ROBAXIN) 500 MG tablet Take 1 tablet (500 mg total) by mouth every 8 (eight) hours as needed for muscle spasms. 30 tablet 0  . pantoprazole (PROTONIX) 40 MG tablet Take 1 tablet (40 mg total) by mouth daily. 30 tablet 1  .  Pseudoeph-Doxylamine-DM-APAP (NYQUIL PO) Take by mouth as needed.    . rosuvastatin (CRESTOR) 10 MG tablet TAKE 1 TABLET EVERY DAY 90 tablet 3  . spironolactone (ALDACTONE) 25 MG tablet TAKE 1 TABLET EVERY DAY 90 tablet 3  . traZODone (DESYREL) 50 MG tablet Take 100 mg by mouth at bedtime.     [name] warfarin (COUMADIN) 5 MG tablet Take 1/2 a tablet to 1 tablet by mouth daily as directed by the coumadin clinic 90 tablet 0   No current facility-administered medications for this encounter.   Vitals:   10/29/20 1408  BP: (!) 150/88  Pulse: 60  SpO2: 95%  Weight: 128.1 kg (282 lb 6.4 oz)   Wt Readings from Last 3 Encounters:  10/29/20 128.1 kg (282 lb 6.4 oz)  11/20/19 125.7 kg (277 lb 3.2 oz)  11/14/19 127 kg (280 lb)   General: NAD, obese.  Neck: No JVD, no thyromegaly or thyroid nodule.  Lungs: Clear to auscultation bilaterally with normal respiratory effort. CV: Nondisplaced PMI.  Heart regular S1/S2, no S3/S4, 2/6 early SEM RUSB.  No peripheral edema.  No carotid bruit.  Normal pedal pulses.  Abdomen: Soft, nontender, no hepatosplenomegaly, no distention.  Skin: Intact without lesions or rashes.  Neurologic: Alert and oriented x 3.  Psych: Normal affect. Extremities: No clubbing or cyanosis.  HEENT: Normal.   Assessment/Plan: 1. Valvular heart disease: TEE in 9/18 showed severe MR, probably infarct-related with restrictive posterior mitral leaflet. There was moderate aortic stenosis and moderate-severe aortic insufficiency, possible rheumatic.  In 12/18, he had bioprosthetic MVR and AVR. Valves stable on echo today.  - Antibiotic prophylaxis needed with dental work.  2. Chronic combined CHF: In setting of significant valvular disease as above. EF down to 30-35% post-op (1/19 echo), likely reflects true LV systolic function without the volume load from severe MR and moderate-severe AI.  TEE in 5/19 showed EF 25-30%.  Repeat echo in 6/19 with EF 30-35%. Repeat echo in 12/19 showed EF up  to 50-55%, echo today again showed EF 50-55% with normal RV systolic function. He is not RV pacing on device interrogation. He is not volume overloaded on exam or by REDS clip.  NYHA class III symptoms, think this may be due mostly to deconditioning and weight gain.  - Continue Lasix 40 mg bid, would not increase.  BMET/BNP today.      - Continue Coreg 12.5 mg bid.   - Continue Entresto 24/26 bid.  - Continue spironolactone 25  mg daily. 3. CKD: Stage 3. BMET today.  4. Atrial fibrillation/flutter: Paroxysmal. He has had a Maze procedure.  He tends to tolerate atrial arrhythmias poorly.  He had atrial flutter with DCCV in 5/19.  Recurrent atrial fibrillation in 2020, now on amiodarone and back in NSR.   - He is on warfarin. CBC today.  - Continue amiodarone 100 mg daily today.  Check LFTs, TSH today.  He will need an eye exam, discussed today.   5. COPD: Mild obstruction on 10/20 PFTs, decreased DLCO.  He has quit smoking.  Stable. No change.   6. CVA: Remote, minimal residual. S/p carotid stenting.  - No ASA given warfarin use.  7. Hyperlipidemia: Check lipids today.      8. Anemia: Fe deficiency.   - CBC today.  9. 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  The artery to the PDA was occluded with left to right collaterals.  He had SVG-PDA in 12/18. No chest pain.  - Continue statin, check lipids today.    - No ASA given warfarin use.  10. CHB: has Medtronic PPM.  Minimal RV pacing now.  11. OSA: Daytime sleepiness.  Had remote sleep study, had OSA but did not want CPAP.  We discussed linkage of OSA and atrial fibrillation, he is willing to use CPAP if needed.  - Will arrange for sleep study.  12. Obesity: We discussed diet/exercise for weight loss.  13. HTN: BP controlled on home readings.   Followup in 3 months.    Loralie Champagne 10/30/2020

## 2020-11-03 ENCOUNTER — Telehealth: Payer: Self-pay | Admitting: Internal Medicine

## 2020-11-03 NOTE — Telephone Encounter (Signed)
Attempted to return phone call.  No answer.

## 2020-11-03 NOTE — Telephone Encounter (Signed)
  1. Has your device fired? No   2. Is you device beeping? No   3. Are you experiencing draining or swelling at device site? No   4. Are you calling to see if we received your device transmission? No   5. Have you passed out? No  Brandon [name] is needing to know if the patient has a shield over his pacemaker. The patient is there in the chair now and is needing X-rays. Please advise.    Please route to [place]

## 2020-11-03 NOTE — Telephone Encounter (Signed)
Spoke to dental office. Advised a scaler is safe to use per Medtronic EMI, be sure not to drape the cord over the device. Verbalized understanding.

## 2020-11-19 ENCOUNTER — Other Ambulatory Visit (HOSPITAL_COMMUNITY): Payer: Self-pay | Admitting: Cardiology

## 2020-11-19 ENCOUNTER — Telehealth (HOSPITAL_COMMUNITY): Payer: Self-pay | Admitting: Licensed Clinical Social Worker

## 2020-11-19 ENCOUNTER — Other Ambulatory Visit (HOSPITAL_COMMUNITY): Payer: Self-pay | Admitting: *Deleted

## 2020-11-19 MED ORDER — ENTRESTO 24-26 MG PO TABS
1.0000 | ORAL_TABLET | Freq: Two times a day (BID) | ORAL | 3 refills | Status: DC
Start: 2020-11-19 — End: 2020-11-19

## 2020-11-19 MED ORDER — ENTRESTO 24-26 MG PO TABS: 1.0000 | ORAL_TABLET | Freq: Two times a day (BID) | ORAL | 3 refills | Status: DC

## 2020-11-19 NOTE — Telephone Encounter (Signed)
[employer] foundation reopened earlier this week and CSW was able to apply for another grant for patient- patient approved:  Approval Details . Patient ID Number: [id number] . Assistance Program: Heart Failure . Group Number: [id number] . Patient assistance starts on: 08/20/2020 and continues until 08/19/2021 . Expenses can be submitted until: Monday, October 18, 2021 . Amount of Assistance Available: 1,000.00    CSW called and informed pt wife of approval- they had moved his Delene Loll to [facility] mail order pharmacy to get lower price so request CSW have someone send over script to [facility] order pharmacy so they can start getting through the [employer] foundation again- CSW sent request to clinic staff.  Pt wife has all the [employer] foundation information and will provide to [employer] when she calls in to get medication shipped to them.  Will continue to follow and assist as needed  Jorge Ny, [facility] Desk#: [phone] Cell#: [phone]

## 2020-11-26 ENCOUNTER — Other Ambulatory Visit: Payer: Self-pay

## 2020-11-26 ENCOUNTER — Ambulatory Visit (INDEPENDENT_AMBULATORY_CARE_PROVIDER_SITE_OTHER): Payer: Medicare HMO | Admitting: *Deleted

## 2020-11-26 DIAGNOSIS — Z953 Presence of xenogenic heart valve: Secondary | ICD-10-CM

## 2020-11-26 DIAGNOSIS — Z5181 Encounter for therapeutic drug level monitoring: Secondary | ICD-10-CM | POA: Diagnosis not present

## 2020-11-26 LAB — POCT INR: INR: 2.8 (ref 2.0–3.0)

## 2020-11-26 NOTE — Patient Instructions (Signed)
Description   Continue taking 1/2 tablet daily except for 1 tablet on Mondays, Wednesdays, and Fridays. Recheck INR in 5 weeks.  Call with any new medications, scheduled procedures or any questions  [phone]

## 2020-12-03 ENCOUNTER — Ambulatory Visit (INDEPENDENT_AMBULATORY_CARE_PROVIDER_SITE_OTHER): Payer: Medicare HMO

## 2020-12-03 DIAGNOSIS — I442 Atrioventricular block, complete: Secondary | ICD-10-CM

## 2020-12-04 LAB — CUP PACEART REMOTE DEVICE CHECK
Battery Remaining Longevity: 118 mo
Battery Voltage: 3.02 V
Brady Statistic AP VP Percent: 0.07 %
Brady Statistic AP VS Percent: 70.71 %
Brady Statistic AS VP Percent: 0.05 %
Brady Statistic AS VS Percent: 29.17 %
Brady Statistic RA Percent Paced: 70.82 %
Brady Statistic RV Percent Paced: 0.12 %
Date Time Interrogation Session: 20211222225626
Implantable Lead Implant Date: 20181211
Implantable Lead Implant Date: 20181211
Implantable Lead Location: 753859
Implantable Lead Location: 753860
Implantable Lead Model: 3830
Implantable Lead Model: 5076
Implantable Pulse Generator Implant Date: 20181211
Lead Channel Impedance Value: 285 Ohm
Lead Channel Impedance Value: 342 Ohm
Lead Channel Impedance Value: 399 Ohm
Lead Channel Impedance Value: 418 Ohm
Lead Channel Pacing Threshold Amplitude: 0.625 V
Lead Channel Pacing Threshold Amplitude: 1.25 V
Lead Channel Pacing Threshold Pulse Width: 0.4 ms
Lead Channel Pacing Threshold Pulse Width: 0.4 ms
Lead Channel Sensing Intrinsic Amplitude: 4.625 mV
Lead Channel Sensing Intrinsic Amplitude: 4.625 mV
Lead Channel Sensing Intrinsic Amplitude: 5.5 mV
Lead Channel Sensing Intrinsic Amplitude: 5.5 mV
Lead Channel Setting Pacing Amplitude: 2 V
Lead Channel Setting Pacing Amplitude: 2.5 V
Lead Channel Setting Pacing Pulse Width: 1 ms
Lead Channel Setting Sensing Sensitivity: 1.2 mV

## 2020-12-07 ENCOUNTER — Ambulatory Visit (HOSPITAL_BASED_OUTPATIENT_CLINIC_OR_DEPARTMENT_OTHER): Payer: Medicare HMO | Attending: Cardiology | Admitting: Cardiology

## 2020-12-07 ENCOUNTER — Other Ambulatory Visit: Payer: Self-pay

## 2020-12-07 DIAGNOSIS — G4737 Central sleep apnea in conditions classified elsewhere: Secondary | ICD-10-CM | POA: Diagnosis not present

## 2020-12-07 DIAGNOSIS — G4736 Sleep related hypoventilation in conditions classified elsewhere: Secondary | ICD-10-CM | POA: Insufficient documentation

## 2020-12-07 DIAGNOSIS — R0683 Snoring: Secondary | ICD-10-CM | POA: Diagnosis not present

## 2020-12-07 DIAGNOSIS — G4733 Obstructive sleep apnea (adult) (pediatric): Secondary | ICD-10-CM | POA: Diagnosis not present

## 2020-12-12 MED FILL — ENTRESTO 24 MG-26 MG TABLET: 24-26 | 90 days supply | Qty: 180 | Fill #0

## 2020-12-13 ENCOUNTER — Other Ambulatory Visit (HOSPITAL_COMMUNITY): Payer: Self-pay | Admitting: Cardiology

## 2020-12-13 DIAGNOSIS — I5022 Chronic systolic (congestive) heart failure: Secondary | ICD-10-CM

## 2020-12-14 NOTE — Procedures (Signed)
   Patient Name: Brandon [name], Brandon [name] Date:12/07/2020 Gender: Male D.O.B: 05-26-1946 Age (years): 74 Referring Provider: Loralie Champagne Height (inches): 71 Interpreting Physician: Fransico Him MD, ABSM Weight (lbs): 276 RPSGT: Gwenyth Allegra BMI: 46 MRN: [id number] Neck Size: 18.00  CLINICAL INFORMATION Sleep Study Type: NPSG  Indication for sleep study: COPD, Hypertension, Snoring  SLEEP STUDY TECHNIQUE As per the AASM Manual for the Scoring of Sleep and Associated Events v2.3 (April 2016) with a hypopnea requiring 4% desaturations.  The channels recorded and monitored were frontal, central and occipital EEG, electrooculogram (EOG), submentalis EMG (chin), nasal and oral airflow, thoracic and abdominal wall motion, anterior tibialis EMG, snore microphone, electrocardiogram, and pulse oximetry.  MEDICATIONS Medications self-administered by patient taken the night of the study : TYLENOL PM, TRAZODONE, ROBAXIN  SLEEP ARCHITECTURE The study was initiated at 9:48:40 PM and ended at 4:33:35 AM.  Sleep onset time was 230.3 minutes and the sleep efficiency was 8.2%%. The total sleep time was 33 minutes.  Stage REM latency was N/A minutes.  The patient spent 92.4%% of the night in stage N1 sleep, 7.6%% in stage N2 sleep, 0.0%% in stage N3 and 0% in REM.  Alpha intrusion was absent.  Supine sleep was 9.09%.  RESPIRATORY PARAMETERS The overall apnea/hypopnea index (AHI) was 89.1 per hour. There were 30 total apneas, including 16 obstructive, 13 central and 1 mixed apneas. There were 19 hypopneas and 0 RERAs.  The AHI during Stage REM sleep was N/A per hour.  AHI while supine was 100.0 per hour.  The mean oxygen saturation was 88.3%. The minimum SpO2 during sleep was 81.0%.  snoring was noted during this study.  CARDIAC DATA The 2 lead EKG demonstrated sinus rhythm. The mean heart rate was 60.7 beats per minute. Other EKG findings include: PVCs  LEG MOVEMENT DATA The total  PLMS were 0 with a resulting PLMS index of 0.0. Associated arousal with leg movement index was 0.0 .  IMPRESSIONS - Severe obstructive sleep apnea occurred during this study (AHI = 89.1/h). - Moderate central sleep apnea occurred during this study (CAI = 23.6/h). - Mild oxygen desaturation was noted during this study (Min O2 = 81.0%). - No snoring was audible during this study. - PVCs were noted during this study. - Clinically significant periodic limb movements did not occur during sleep. No significant associated arousals.  DIAGNOSIS - Obstructive Sleep Apnea (G47.33) - Central Sleep Apnea (G47.37) - Nocturnal Hypoxemia (G47.36)  RECOMMENDATIONS - CPAP titration to determine optimal pressure required to alleviate sleep disordered breathing. BiPAP or ASV titration may be required to eliminate central sleep apnea. - Positional therapy avoiding supine position during sleep. - Avoid alcohol, sedatives and other CNS depressants that may worsen sleep apnea and disrupt normal sleep architecture. - Sleep hygiene should be reviewed to assess factors that may improve sleep quality. - Weight management and regular exercise should be initiated or continued if appropriate.  [Electronically signed] 12/14/2020 02:58 PM  Fransico Him MD, ABSM Diplomate, American Board of Sleep Medicine

## 2020-12-15 ENCOUNTER — Ambulatory Visit: Payer: Medicare HMO | Admitting: Internal Medicine

## 2020-12-15 ENCOUNTER — Encounter: Payer: Self-pay | Admitting: Internal Medicine

## 2020-12-15 ENCOUNTER — Other Ambulatory Visit: Payer: Self-pay

## 2020-12-15 VITALS — BP 130/72 | HR 61 | Ht 71.0 in | Wt 285.8 lb

## 2020-12-15 DIAGNOSIS — Z950 Presence of cardiac pacemaker: Secondary | ICD-10-CM

## 2020-12-15 DIAGNOSIS — I480 Paroxysmal atrial fibrillation: Secondary | ICD-10-CM | POA: Diagnosis not present

## 2020-12-15 DIAGNOSIS — I442 Atrioventricular block, complete: Secondary | ICD-10-CM | POA: Diagnosis not present

## 2020-12-15 DIAGNOSIS — I5022 Chronic systolic (congestive) heart failure: Secondary | ICD-10-CM | POA: Diagnosis not present

## 2020-12-15 NOTE — Patient Instructions (Signed)
Medication Instructions:  Your physician recommends that you continue on your current medications as directed. Please refer to the Current Medication list given to you today.  Labwork: None ordered.  Testing/Procedures: None ordered.  Follow-Up: Your physician wants you to follow-up in: one year with Dr. Lovena Le.   You will receive a reminder letter in the mail two months in advance. If you don't receive a letter, please call our office to schedule the follow-up appointment.  Remote monitoring is used to monitor your Pacemaker from home. This monitoring reduces the number of office visits required to check your device to one time per year. It allows [place] to keep an eye on the functioning of your device to ensure it is working properly. You are scheduled for a device check from home on 03/04/2021. You may send your transmission at any time that day. If you have a wireless device, the transmission will be sent automatically. After your physician reviews your transmission, you will receive a postcard with your next transmission date.  Any Other Special Instructions Will Be Listed Below (If Applicable).  If you need a refill on your cardiac medications before your next appointment, please call your pharmacy.

## 2020-12-15 NOTE — Progress Notes (Signed)
HPI Brandon [name] returns today for followup. He is a pleasant 75 yo man with aortic and mitral valve disease, s/p surgery with heart block s/p PPM insertion. His conduction improved and when I saw him last we reprogrammed his device to avoid ventricular pacing. He has undergone repeat echo and his EF has improved from the 30 to 50% ranges. He feels well. No syncope or palpitations. he has been unable to lose weight. Allergies  Allergen Reactions  . Bee Venom Anaphylaxis and Swelling     Current Outpatient Medications  Medication Sig Dispense Refill  . acetaminophen (TYLENOL) 325 MG tablet Take 1 tablet (325 mg total) by mouth every 6 (six) hours as needed for mild pain. 30 tablet 0  . amiodarone (PACERONE) 100 MG tablet Take 1 tablet (100 mg total) by mouth daily. Please make yearly appt with Dr. Lovena Le for December before anymore refills. 1st attempt 90 tablet 0  . amLODipine (NORVASC) 2.5 MG tablet TAKE 1 TABLET EVERY DAY 90 tablet 3  . calcium carbonate (TUMS - DOSED IN MG ELEMENTAL CALCIUM) 500 MG chewable tablet Chew 1 tablet by mouth daily as needed for indigestion or heartburn.    . carvedilol (COREG) 12.5 MG tablet TAKE 1 TABLET TWICE DAILY WITH MEALS 180 tablet 3  . cetirizine (ZYRTEC) 10 MG tablet Take 10 mg by mouth daily as needed for allergies.    . citalopram (CELEXA) 20 MG tablet Take 1 tablet (20 mg total) by mouth at bedtime. 30 tablet 5  . docusate sodium (COLACE) 100 MG capsule Take 1 capsule (100 mg total) by mouth 3 (three) times daily as needed. 20 capsule 0  . furosemide (LASIX) 40 MG tablet Take 40 mg by mouth 2 (two) times daily.    . methocarbamol (ROBAXIN) 500 MG tablet Take 1 tablet (500 mg total) by mouth every 8 (eight) hours as needed for muscle spasms. 30 tablet 0  . pantoprazole (PROTONIX) 40 MG tablet Take 1 tablet (40 mg total) by mouth daily. 30 tablet 1  . Pseudoeph-Doxylamine-DM-APAP (NYQUIL PO) Take by mouth as needed.    . rosuvastatin (CRESTOR)  10 MG tablet TAKE 1 TABLET EVERY DAY 90 tablet 3  . sacubitril-valsartan (ENTRESTO) 24-26 MG Take 1 tablet by mouth 2 (two) times daily. 180 tablet 3  . spironolactone (ALDACTONE) 25 MG tablet TAKE 1 TABLET EVERY DAY 90 tablet 3  . traZODone (DESYREL) 50 MG tablet Take 100 mg by mouth at bedtime.    [name] warfarin (COUMADIN) 5 MG tablet Take 1/2 a tablet to 1 tablet by mouth daily as directed by the coumadin clinic 90 tablet 0   No current facility-administered medications for this visit.     Past Medical History:  Diagnosis Date  . Anemia 07/13/2017  . Anxiety   . Aortic insufficiency   . Aortic stenosis, moderate 07/13/2017  . Arthritis    back   . Carotid stenosis    Right carotid stent (widely patent) 40 - 59% left plaque 11/13  . CHF (congestive heart failure) ([place])   . Chronic kidney disease   . COPD (chronic obstructive pulmonary disease) ([place])   . Coronary artery disease involving native coronary artery of native heart without angina pectoris   . Depression   . Dyslipidemia   . GERD (gastroesophageal reflux disease)   . Heart murmur   . Hemiplegia affecting unspecified side, late effect of cerebrovascular disease    resolved- from L side   . Hypertension   .  Jaundice    resolved following ERCP & Cholecystectomy  . Mild emphysema ([place])   . Mitral regurgitation   . Mitral valve insufficiency and aortic valve insufficiency   . Myocardial infarction ([place])   . Paroxysmal atrial fibrillation ([facility])   . Pre-diabetes    per spouse  . S/P aortic valve replacement with bioprosthetic valve 11/16/2017   23 mm [facility] Ease stented bovine pericardial tissue valve  . S/P CABG x 1 11/16/2017   SVG to PDA with EVH via right thigh  . S/P [place] operation for atrial fibrillation 11/16/2017   Left side lesion set using bipolar radiofrequency and cryothermy ablation with clipping of LA appendage  . S/P mitral valve replacement with bioprosthetic valve 11/16/2017   27 mm [facility] Mitral  stented bovine pericardial tissue valve  . Sleep apnea    does not wear CPAP  . Sleep concern    resulted in surgery- after + sleep test. Pt. doesn't have a problem any longer.   . Stroke ([place]) 03/11/2003   stent placed on the 31, 3, 2004, L side   . Wears glasses   . Wears hearing aid in both ears   . Wears partial dentures     ROS:   All systems reviewed and negative except as noted in the HPI.   Past Surgical History:  Procedure Laterality Date  . AORTIC VALVE REPLACEMENT N/A 11/16/2017   Procedure: AORTIC VALVE REPLACEMENT (AVR) with 29mm Magna Ease;  Surgeon: Rexene Alberts, MD;  Location: [place];  Service: Open Heart Surgery;  Laterality: N/A;  . ARTERIAL LINE INSERTION Right 11/16/2017   Procedure: ARTERIAL LINE INSERTION -RIGHT FEMORAL;  Surgeon: Rexene Alberts, MD;  Location: [place];  Service: Open Heart Surgery;  Laterality: Right;  . BACK SURGERY     lumbar back  . CARDIOVERSION N/A 04/11/2018   Procedure: CARDIOVERSION;  Surgeon: Larey Dresser, MD;  Location: [facility] END[facility];  Service: Cardiovascular;  Laterality: N/A;  . CHOLECYSTECTOMY    . CORONARY ARTERY BYPASS GRAFT N/A 11/16/2017   Procedure: CORONARY ARTERY BYPASS GRAFTING (CABG) x 1;  Surgeon: Rexene Alberts, MD;  Location: [place];  Service: Open Heart Surgery;  Laterality: N/A;  . ENDOVEIN HARVEST OF GREATER SAPHENOUS VEIN Right 11/16/2017   Procedure: ENDOVEIN HARVEST OF GREATER SAPHENOUS VEIN;  Surgeon: Rexene Alberts, MD;  Location: [place];  Service: Open Heart Surgery;  Laterality: Right;  . ERCP N/A 05/31/2013   Procedure: ENDOSCOPIC RETROGRADE CHOLANGIOPANCREATOGRAPHY (ERCP);  Surgeon: Ladene Artist, MD;  Location: [facility];  Service: Endoscopy;  Laterality: N/A;  . FOOT SURGERY     right  . IR RADIOLOGY PERIPHERAL GUIDED IV START  09/28/2017  . IR US GUIDE VASC ACCESS RIGHT  09/28/2017  . LAPAROSCOPIC CHOLECYSTECTOMY SINGLE PORT N/A 06/01/2013   Procedure: LAPAROSCOPIC CHOLECYSTECTOMY SINGLE PORT;   Surgeon: Adin Hector, MD;  Location: [facility];  Service: General;  Laterality: N/A;  . MAZE N/A 11/16/2017   Procedure: MAZE;  Surgeon: Rexene Alberts, MD;  Location: [place];  Service: Open Heart Surgery;  Laterality: N/A;  . MITRAL VALVE REPAIR N/A 11/16/2017   Procedure: MITRAL VALVE  REPLACEMENT with 77mm MagnaEase;  Surgeon: Rexene Alberts, MD;  Location: [place];  Service: Open Heart Surgery;  Laterality: N/A;  . PACEMAKER IMPLANT N/A 11/21/2017   Procedure: PACEMAKER IMPLANT;  Surgeon: Evans Lance, MD;  Location: [facility];  Service: Cardiovascular;  Laterality: N/A;  . POLYPECTOMY    .  RIGHT/LEFT HEART CATH AND CORONARY ANGIOGRAPHY N/A 08/30/2017   Procedure: RIGHT/LEFT HEART CATH AND CORONARY ANGIOGRAPHY;  Surgeon: Larey Dresser, MD;  Location: [place] CV LAB;  Service: Cardiovascular;  Laterality: N/A;  . SHOULDER ARTHROSCOPY WITH ROTATOR CUFF REPAIR AND SUBACROMIAL DECOMPRESSION Left 05/18/2017  . SHOULDER ARTHROSCOPY WITH ROTATOR CUFF REPAIR AND SUBACROMIAL DECOMPRESSION Left 05/18/2017   Procedure: SHOULDER ARTHROSCOPY WITH ROTATOR CUFF REPAIR AND SUBACROMIAL DECOMPRESSION;  Surgeon: Tania Ade, MD;  Location: [place];  Service: Orthopedics;  Laterality: Left;  LEFT SHOULDER ARTHROSCOPY WITH ROTATOR CUFF REPAIR AND SUBACROMIAL DECOMPRESSION  . TEE WITHOUT CARDIOVERSION N/A 05/09/2017   Procedure: TRANSESOPHAGEAL ECHOCARDIOGRAM (TEE);  Surgeon: Skeet Latch, MD;  Location: [place];  Service: Cardiovascular;  Laterality: N/A;  . TEE WITHOUT CARDIOVERSION N/A 08/30/2017   Procedure: TRANSESOPHAGEAL ECHOCARDIOGRAM (TEE);  Surgeon: Larey Dresser, MD;  Location: [facility] ENDOSCOPY;  Service: Cardiovascular;  Laterality: N/A;  . TEE WITHOUT CARDIOVERSION N/A 11/16/2017   Procedure: TRANSESOPHAGEAL ECHOCARDIOGRAM (TEE);  Surgeon: Rexene Alberts, MD;  Location: [place];  Service: Open Heart Surgery;  Laterality: N/A;  . TEE WITHOUT CARDIOVERSION N/A 04/11/2018    Procedure: TRANSESOPHAGEAL ECHOCARDIOGRAM (TEE);  Surgeon: Larey Dresser, MD;  Location: [facility] ENDOSCOPY;  Service: Cardiovascular;  Laterality: N/A;  . TONSILLECTOMY       Family History  Problem Relation Age of Onset  . Stroke Father        No details  . Angina Mother      Social History   Socioeconomic History  . Marital status: Married    Spouse name: Not on file  . Number of children: 3  . Years of education: Not on file  . Highest education level: Not on file  Occupational History    Employer: RETIRED  Tobacco Use  . Smoking status: Former Smoker    Packs/day: 0.50    Years: 57.00    Pack years: 28.50    Types: Cigarettes    Quit date: 07/13/1997    Years since quitting: 23.4  . Smokeless tobacco: Never Used  Vaping Use  . Vaping Use: Former  . Quit date: 07/13/2017  Substance and Sexual Activity  . Alcohol use: Yes    Alcohol/week: 2.0 - 3.0 standard drinks    Types: 2 - 3 Glasses of wine per week    Comment: moderate wine ; not since 8/ 2018  . Drug use: No  . Sexual activity: Yes  Other Topics Concern  . Not on file  Social History Narrative   Two living children.  Lives with wife.     Social Determinants of Health   Financial Resource Strain: Not on file  Food Insecurity: Not on file  Transportation Needs: Not on file  Physical Activity: Not on file  Stress: Not on file  Social Connections: Not on file  Intimate Partner Violence: Not on file     BP 130/72   Pulse 61   Ht 5\' 11"  (1.803 m)   Wt 285 lb 12.8 oz (129.6 kg)   SpO2 90%   BMI 39.86 kg/m   Physical Exam:  Well appearing NAD HEENT: Unremarkable Neck:  No JVD, no thyromegally Lymphatics:  No adenopathy Back:  No CVA tenderness Lungs:  Clear with no wheezes HEART:  Regular rate rhythm, no murmurs, no rubs, no clicks Abd:  soft, positive bowel sounds, no organomegally, no rebound, no guarding Ext:  2 plus pulses, no edema, no cyanosis, no clubbing Skin:  No rashes no  nodules  Neuro:  CN II through XII intact, motor grossly intact  EKG - NSR with av pacing  DEVICE  Normal device function.  See PaceArt for details.   Assess/Plan: 1. Sinus node dysfunction - he is asymptomatic,s/p PPM insertion. 2. CHB - he is pacing 99% of the time. We will follow. 3. Chronic systolic heart failure - his symptoms are class 2. He will continue his current meds. 4. Obesity - we discussed intermittent fasting. I have asked him to lose weight.  Carleene Overlie Vitalia Stough,MD

## 2020-12-16 LAB — CUP PACEART INCLINIC DEVICE CHECK
Battery Remaining Longevity: 129 mo
Battery Voltage: 3.02 V
Brady Statistic AP VP Percent: 0.09 %
Brady Statistic AP VS Percent: 66.48 %
Brady Statistic AS VP Percent: 0.05 %
Brady Statistic AS VS Percent: 33.38 %
Brady Statistic RA Percent Paced: 66.02 %
Brady Statistic RV Percent Paced: 0.42 %
Date Time Interrogation Session: 20220104150800
Implantable Lead Implant Date: 20181211
Implantable Lead Implant Date: 20181211
Implantable Lead Location: 753859
Implantable Lead Location: 753860
Implantable Lead Model: 3830
Implantable Lead Model: 5076
Implantable Pulse Generator Implant Date: 20181211
Lead Channel Impedance Value: 285 Ohm
Lead Channel Impedance Value: 342 Ohm
Lead Channel Impedance Value: 418 Ohm
Lead Channel Impedance Value: 437 Ohm
Lead Channel Pacing Threshold Amplitude: 0.75 V
Lead Channel Pacing Threshold Amplitude: 1 V
Lead Channel Pacing Threshold Pulse Width: 0.4 ms
Lead Channel Pacing Threshold Pulse Width: 0.4 ms
Lead Channel Sensing Intrinsic Amplitude: 5.125 mV
Lead Channel Sensing Intrinsic Amplitude: 5.75 mV
Lead Channel Setting Pacing Amplitude: 2 V
Lead Channel Setting Pacing Amplitude: 2 V
Lead Channel Setting Pacing Pulse Width: 0.4 ms
Lead Channel Setting Sensing Sensitivity: 1.2 mV

## 2020-12-16 NOTE — Progress Notes (Signed)
Remote pacemaker transmission.   

## 2020-12-21 ENCOUNTER — Encounter (HOSPITAL_COMMUNITY): Payer: Self-pay

## 2020-12-22 ENCOUNTER — Telehealth: Payer: Self-pay | Admitting: *Deleted

## 2020-12-22 DIAGNOSIS — G4733 Obstructive sleep apnea (adult) (pediatric): Secondary | ICD-10-CM

## 2020-12-22 NOTE — Telephone Encounter (Signed)
-----   Message from Sueanne Margarita, MD sent at 12/14/2020  3:00 PM EST ----- Please let patient know that they have sleep apnea and recommend CPAP titration. Please set up titration in the sleep lab ASAP

## 2020-12-22 NOTE — Telephone Encounter (Signed)
Informed patient of sleep study results and patient understanding was verbalized. Patient understands her sleep study showed they have sleep apnea and recommend CPAP titration. Please set up titration in the sleep lab ASAP   Titration sent to sleep pool PER DPR WIFE Pt is aware and agreeable to husbands results

## 2020-12-24 ENCOUNTER — Telehealth: Payer: Self-pay | Admitting: Cardiology

## 2020-12-24 NOTE — Telephone Encounter (Signed)
PA authorized #[id number] for dos 01-25-21 thru 02-24-21.  Called patient and his wife said he is very hesitant to do this, but he did finally agree to it.  I told her he is scheduled for Sunday, Feb. 27 at 8 pm.

## 2020-12-25 ENCOUNTER — Other Ambulatory Visit (HOSPITAL_COMMUNITY)
Admission: RE | Admit: 2020-12-25 | Discharge: 2020-12-25 | Disposition: A | Discharge status: Home / Self Care | Payer: Medicare HMO | Source: Ambulatory Visit | Attending: Cardiology | Admitting: Cardiology

## 2020-12-25 ENCOUNTER — Telehealth: Payer: Self-pay | Admitting: Emergency Medicine

## 2020-12-25 DIAGNOSIS — Z01812 Encounter for preprocedural laboratory examination: Secondary | ICD-10-CM | POA: Insufficient documentation

## 2020-12-25 DIAGNOSIS — Z20822 Contact with and (suspected) exposure to covid-19: Secondary | ICD-10-CM | POA: Diagnosis not present

## 2020-12-25 LAB — SARS CORONAVIRUS 2 (TAT 6-24 HRS): SARS Coronavirus 2: NEGATIVE

## 2020-12-25 NOTE — Telephone Encounter (Signed)
Pt left a voicemail returning nurse phone call.

## 2020-12-25 NOTE — Telephone Encounter (Signed)
LMOM to call DC, # and hours provided. Alert for ongoing AT with controlled v-rates since 12/24/20 at 1734. Multiple short episodes prior to ongoing one on 12/24/20. Meds: amiodorone 200 mg daily, Coreg 12.5 mg daily, Entresto 24-26 mg BID, Lasix 40 mg BID.

## 2020-12-25 NOTE — Telephone Encounter (Signed)
Pt wife left another voicemail returning nurse call.

## 2020-12-25 NOTE — Telephone Encounter (Signed)
Patient HOH  And wife on [place]. Wife reports patient has been extremely fatigued over the past 3 days. No missed  Doses of meds . Transmission sent and reviewed that shows patient is now AP / VS at 60 bpm. Patient has felt better this afternoon. ED precautions given for CP, SOB, chest pressure , or worsening cardiac symptoms.

## 2020-12-28 ENCOUNTER — Inpatient Hospital Stay (HOSPITAL_COMMUNITY): Admission: RE | Admit: 2020-12-28 | Payer: Medicare HMO | Source: Ambulatory Visit

## 2020-12-31 ENCOUNTER — Other Ambulatory Visit: Payer: Self-pay

## 2020-12-31 ENCOUNTER — Ambulatory Visit (INDEPENDENT_AMBULATORY_CARE_PROVIDER_SITE_OTHER): Payer: Medicare HMO | Admitting: *Deleted

## 2020-12-31 DIAGNOSIS — Z5181 Encounter for therapeutic drug level monitoring: Secondary | ICD-10-CM | POA: Diagnosis not present

## 2020-12-31 DIAGNOSIS — Z953 Presence of xenogenic heart valve: Secondary | ICD-10-CM | POA: Diagnosis not present

## 2020-12-31 LAB — POCT INR: INR: 2.4 (ref 2.0–3.0)

## 2020-12-31 NOTE — Patient Instructions (Signed)
Description   Continue taking 1/2 tablet daily except for 1 tablet on Mondays, Wednesdays, and Fridays. Recheck INR in 6 weeks.  Call with any new medications, scheduled procedures or any questions  [phone]

## 2021-01-05 ENCOUNTER — Other Ambulatory Visit (HOSPITAL_COMMUNITY)
Admission: RE | Admit: 2021-01-05 | Discharge: 2021-01-05 | Disposition: A | Discharge status: Home / Self Care | Payer: Medicare HMO | Source: Ambulatory Visit | Attending: Cardiology | Admitting: Cardiology

## 2021-01-05 DIAGNOSIS — Z01812 Encounter for preprocedural laboratory examination: Secondary | ICD-10-CM | POA: Insufficient documentation

## 2021-01-05 DIAGNOSIS — Z20822 Contact with and (suspected) exposure to covid-19: Secondary | ICD-10-CM | POA: Insufficient documentation

## 2021-01-05 LAB — SARS CORONAVIRUS 2 (TAT 6-24 HRS): SARS Coronavirus 2: NEGATIVE

## 2021-01-07 ENCOUNTER — Other Ambulatory Visit: Payer: Self-pay

## 2021-01-07 ENCOUNTER — Ambulatory Visit (HOSPITAL_COMMUNITY)
Admission: RE | Admit: 2021-01-07 | Discharge: 2021-01-07 | Disposition: A | Discharge status: Home / Self Care | Payer: Medicare HMO | Source: Ambulatory Visit | Attending: Cardiology | Admitting: Cardiology

## 2021-01-07 DIAGNOSIS — I5022 Chronic systolic (congestive) heart failure: Secondary | ICD-10-CM | POA: Diagnosis not present

## 2021-01-07 LAB — PULMONARY FUNCTION TEST
DL/VA % pred: 65 %
DL/VA: 2.61 ml/min/mmHg/L
DLCO unc % pred: 50 %
DLCO unc: 13.2 ml/min/mmHg
FEF 25-75 Post: 2.42 L/sec
FEF 25-75 Pre: 1.95 L/sec
FEF2575-%Change-Post: 23 %
FEF2575-%Pred-Post: 102 %
FEF2575-%Pred-Pre: 82 %
FEV1-%Change-Post: 8 %
FEV1-%Pred-Post: 67 %
FEV1-%Pred-Pre: 62 %
FEV1-Post: 2.19 L
FEV1-Pre: 2.03 L
FEV1FVC-%Change-Post: 2 %
FEV1FVC-%Pred-Pre: 108 %
FEV6-%Change-Post: 6 %
FEV6-%Pred-Post: 64 %
FEV6-%Pred-Pre: 60 %
FEV6-Post: 2.7 L
FEV6-Pre: 2.54 L
FEV6FVC-%Change-Post: 0 %
FEV6FVC-%Pred-Post: 106 %
FEV6FVC-%Pred-Pre: 105 %
FVC-%Change-Post: 5 %
FVC-%Pred-Post: 60 %
FVC-%Pred-Pre: 57 %
FVC-Post: 2.7 L
FVC-Pre: 2.56 L
Post FEV1/FVC ratio: 81 %
Post FEV6/FVC ratio: 100 %
Pre FEV1/FVC ratio: 79 %
Pre FEV6/FVC Ratio: 99 %
RV % pred: 115 %
RV: 2.98 L
TLC % pred: 83 %
TLC: 6.03 L

## 2021-01-07 MED ORDER — ALBUTEROL SULFATE (2.5 MG/3ML) 0.083% IN NEBU
2.5000 mg | INHALATION_SOLUTION | Freq: Once | RESPIRATORY_TRACT | Status: AC
  Administered 2021-01-07: 2.5 mg via RESPIRATORY_TRACT

## 2021-01-11 ENCOUNTER — Telehealth (HOSPITAL_COMMUNITY): Payer: Self-pay

## 2021-01-11 DIAGNOSIS — J849 Interstitial pulmonary disease, unspecified: Secondary | ICD-10-CM

## 2021-01-11 NOTE — Telephone Encounter (Signed)
-----   Message from Larey Dresser, MD sent at 01/10/2021  8:24 PM EST ----- Based on restrictive PFTs, would have him get high resolution CT chest to rule out interstitial lung disease caused by amiodarone.

## 2021-01-11 NOTE — Telephone Encounter (Signed)
Patient advised and verbalized understanding. Order entered   Orders Placed This Encounter  Procedures  . CT CHEST HIGH RESOLUTION    Standing Status:   Future    Standing Expiration Date:   01/11/2022    Order Specific Question:   Preferred imaging location?    Answer:   [facility]    Order Specific Question:   Call Results- Best Contact Number?    Answer:   HE:[phone]    Order Specific Question:   Radiology Contrast Protocol - do NOT remove file path    Answer:   [email or web address]

## 2021-01-20 ENCOUNTER — Ambulatory Visit (HOSPITAL_COMMUNITY)
Admission: RE | Admit: 2021-01-20 | Discharge: 2021-01-20 | Disposition: A | Discharge status: Home / Self Care | Payer: Medicare HMO | Source: Ambulatory Visit | Attending: Cardiology | Admitting: Cardiology

## 2021-01-20 ENCOUNTER — Encounter (HOSPITAL_COMMUNITY): Payer: Self-pay

## 2021-01-20 ENCOUNTER — Other Ambulatory Visit: Payer: Self-pay

## 2021-01-20 DIAGNOSIS — J849 Interstitial pulmonary disease, unspecified: Secondary | ICD-10-CM | POA: Insufficient documentation

## 2021-01-20 DIAGNOSIS — R0602 Shortness of breath: Secondary | ICD-10-CM | POA: Diagnosis not present

## 2021-01-22 ENCOUNTER — Other Ambulatory Visit (HOSPITAL_COMMUNITY): Payer: Self-pay | Admitting: *Deleted

## 2021-01-22 DIAGNOSIS — J849 Interstitial pulmonary disease, unspecified: Secondary | ICD-10-CM

## 2021-01-25 ENCOUNTER — Other Ambulatory Visit: Payer: Self-pay | Admitting: Internal Medicine

## 2021-01-29 ENCOUNTER — Ambulatory Visit (HOSPITAL_COMMUNITY)
Admission: RE | Admit: 2021-01-29 | Discharge: 2021-01-29 | Disposition: A | Discharge status: Home / Self Care | Payer: Medicare HMO | Source: Ambulatory Visit | Attending: Cardiology | Admitting: Cardiology

## 2021-01-29 ENCOUNTER — Other Ambulatory Visit (HOSPITAL_COMMUNITY): Payer: Self-pay | Admitting: Cardiology

## 2021-01-29 ENCOUNTER — Encounter (HOSPITAL_COMMUNITY): Payer: Self-pay | Admitting: Cardiology

## 2021-01-29 ENCOUNTER — Other Ambulatory Visit: Payer: Self-pay

## 2021-01-29 VITALS — BP 160/80 | HR 60 | Wt 283.4 lb

## 2021-01-29 DIAGNOSIS — Z8673 Personal history of transient ischemic attack (TIA), and cerebral infarction without residual deficits: Secondary | ICD-10-CM | POA: Diagnosis not present

## 2021-01-29 DIAGNOSIS — I480 Paroxysmal atrial fibrillation: Secondary | ICD-10-CM | POA: Diagnosis not present

## 2021-01-29 DIAGNOSIS — I130 Hypertensive heart and chronic kidney disease with heart failure and stage 1 through stage 4 chronic kidney disease, or unspecified chronic kidney disease: Secondary | ICD-10-CM | POA: Diagnosis not present

## 2021-01-29 DIAGNOSIS — J449 Chronic obstructive pulmonary disease, unspecified: Secondary | ICD-10-CM | POA: Insufficient documentation

## 2021-01-29 DIAGNOSIS — G4733 Obstructive sleep apnea (adult) (pediatric): Secondary | ICD-10-CM | POA: Insufficient documentation

## 2021-01-29 DIAGNOSIS — I352 Nonrheumatic aortic (valve) stenosis with insufficiency: Secondary | ICD-10-CM | POA: Diagnosis not present

## 2021-01-29 DIAGNOSIS — D509 Iron deficiency anemia, unspecified: Secondary | ICD-10-CM | POA: Diagnosis not present

## 2021-01-29 DIAGNOSIS — I2510 Atherosclerotic heart disease of native coronary artery without angina pectoris: Secondary | ICD-10-CM | POA: Diagnosis not present

## 2021-01-29 DIAGNOSIS — R0602 Shortness of breath: Secondary | ICD-10-CM | POA: Insufficient documentation

## 2021-01-29 DIAGNOSIS — Z7901 Long term (current) use of anticoagulants: Secondary | ICD-10-CM | POA: Diagnosis not present

## 2021-01-29 DIAGNOSIS — N183 Chronic kidney disease, stage 3 unspecified: Secondary | ICD-10-CM | POA: Diagnosis not present

## 2021-01-29 DIAGNOSIS — M25569 Pain in unspecified knee: Secondary | ICD-10-CM | POA: Insufficient documentation

## 2021-01-29 DIAGNOSIS — I5022 Chronic systolic (congestive) heart failure: Secondary | ICD-10-CM

## 2021-01-29 DIAGNOSIS — Z87891 Personal history of nicotine dependence: Secondary | ICD-10-CM | POA: Insufficient documentation

## 2021-01-29 DIAGNOSIS — I5042 Chronic combined systolic (congestive) and diastolic (congestive) heart failure: Secondary | ICD-10-CM | POA: Diagnosis not present

## 2021-01-29 DIAGNOSIS — E785 Hyperlipidemia, unspecified: Secondary | ICD-10-CM | POA: Insufficient documentation

## 2021-01-29 DIAGNOSIS — J849 Interstitial pulmonary disease, unspecified: Secondary | ICD-10-CM | POA: Insufficient documentation

## 2021-01-29 DIAGNOSIS — E669 Obesity, unspecified: Secondary | ICD-10-CM | POA: Diagnosis not present

## 2021-01-29 DIAGNOSIS — Z79899 Other long term (current) drug therapy: Secondary | ICD-10-CM | POA: Insufficient documentation

## 2021-01-29 DIAGNOSIS — Z7984 Long term (current) use of oral hypoglycemic drugs: Secondary | ICD-10-CM | POA: Insufficient documentation

## 2021-01-29 DIAGNOSIS — I38 Endocarditis, valve unspecified: Secondary | ICD-10-CM | POA: Diagnosis not present

## 2021-01-29 DIAGNOSIS — I4892 Unspecified atrial flutter: Secondary | ICD-10-CM | POA: Diagnosis not present

## 2021-01-29 DIAGNOSIS — Z953 Presence of xenogenic heart valve: Secondary | ICD-10-CM | POA: Diagnosis not present

## 2021-01-29 LAB — CBC
HCT: 38.7 % — ABNORMAL LOW (ref 39.0–52.0)
Hemoglobin: 12.4 g/dL — ABNORMAL LOW (ref 13.0–17.0)
MCH: 32.8 pg (ref 26.0–34.0)
MCHC: 32 g/dL (ref 30.0–36.0)
MCV: 102.4 fL — ABNORMAL HIGH (ref 80.0–100.0)
Platelets: 235 10*3/uL (ref 150–400)
RBC: 3.78 MIL/uL — ABNORMAL LOW (ref 4.22–5.81)
RDW: 14.6 % (ref 11.5–15.5)
WBC: 7.3 10*3/uL (ref 4.0–10.5)
nRBC: 0 % (ref 0.0–0.2)

## 2021-01-29 LAB — COMPREHENSIVE METABOLIC PANEL
ALT: 10 U/L (ref 0–44)
AST: 16 U/L (ref 15–41)
Albumin: 3.8 g/dL (ref 3.5–5.0)
Alkaline Phosphatase: 31 U/L — ABNORMAL LOW (ref 38–126)
Anion gap: 11 (ref 5–15)
BUN: 30 mg/dL — ABNORMAL HIGH (ref 8–23)
CO2: 22 mmol/L (ref 22–32)
Calcium: 8.7 mg/dL — ABNORMAL LOW (ref 8.9–10.3)
Chloride: 108 mmol/L (ref 98–111)
Creatinine, Ser: 1.77 mg/dL — ABNORMAL HIGH (ref 0.61–1.24)
GFR, Estimated: 40 mL/min — ABNORMAL LOW (ref >60)
Glucose, Bld: 111 mg/dL — ABNORMAL HIGH (ref 70–99)
Potassium: 4.2 mmol/L (ref 3.5–5.1)
Sodium: 141 mmol/L (ref 135–145)
Total Bilirubin: 1.4 mg/dL — ABNORMAL HIGH (ref 0.3–1.2)
Total Protein: 7.4 g/dL (ref 6.5–8.1)

## 2021-01-29 LAB — TSH: TSH: 2.405 u[IU]/mL (ref 0.350–4.500)

## 2021-01-29 LAB — SEDIMENTATION RATE: Sed Rate: 46 mm/hr — ABNORMAL HIGH (ref 0–16)

## 2021-01-29 MED ORDER — EMPAGLIFLOZIN 10 MG PO TABS
10.0000 mg | ORAL_TABLET | Freq: Every day | ORAL | 3 refills | Status: DC
Start: 2021-01-29 — End: 2021-01-29

## 2021-01-29 MED ORDER — EMPAGLIFLOZIN 10 MG PO TABS: 10.0000 mg | ORAL_TABLET | Freq: Every day | ORAL | 11 refills | Status: DC

## 2021-01-29 NOTE — Patient Instructions (Addendum)
EKG done today.  Labs done today. We will contact you only if your labs are abnormal.  START Jardiance '10mg'$  (1 tablet) by mouth daily  No other medication changes were made. Please continue all current medications as prescribed.  Your physician recommends that you schedule a follow-up appointment in: 10 days for a lab only appointment and in 3 weeks for an appointment with our [facility] Clinic here in office.   If you have any questions or concerns before your next appointment please send [place] a message through [place] or call our office at [phone].    TO LEAVE A MESSAGE FOR THE NURSE SELECT OPTION 2, PLEASE LEAVE A MESSAGE INCLUDING: . YOUR NAME . DATE OF BIRTH . CALL BACK NUMBER . REASON FOR CALL**this is important as we prioritize the call backs  YOU WILL RECEIVE A CALL BACK THE SAME DAY AS LONG AS YOU CALL BEFORE 4:00 PM   Do the following things EVERYDAY: 1) Weigh yourself in the morning before breakfast. Write it down and keep it in a log. 2) Take your medicines as prescribed 3) Eat low salt foods--Limit salt (sodium) to 2000 mg per day.  4) Stay as active as you can everyday 5) Limit all fluids for the day to less than 2 liters   At the [facility], you and your health needs are our priority. As part of our continuing mission to provide you with exceptional heart care, we have created designated Provider Care Teams. These Care Teams include your primary Cardiologist (physician) and Advanced Practice Providers (APPs- Physician Assistants and Nurse Practitioners) who all work together to provide you with the care you need, when you need it.   You may see any of the following providers on your designated Care Team at your next follow up: [name] Dr Glori Bickers . Dr Loralie Champagne . Darrick Grinder, NP . Lyda Jester, PA . Audry Riles, PharmD   Please be sure to bring in all your medications bottles to every appointment.

## 2021-01-29 NOTE — Progress Notes (Signed)
Medication Samples have been provided to the patient.  Drug name: Jardiance       Strength: 10 mg        Qty: 2  LOTEN:[id number]  Exp.Date: 08/2022  Dosing instructions: 1 tablet daily  The patient has been instructed regarding the correct time, dose, and frequency of taking this medication, including desired effects and most common side effects.   Brandon [name] Brandon [name] 2:31 PM 01/29/2021

## 2021-01-31 NOTE — Progress Notes (Signed)
Advanced Heart Failure Clinic Note   PCP: Dr. Justin Mend Cardiology: Dr. Radford Pax HF Cardiology: Dr. Aundra Dubin  75 y.o.with history of CVA, paroxysmal atrial fibrillation, valvular heart disease, and chronic diastolic CHF presents for evaluation of CHF and valvular heart disease.    He had had episodes of dyspnea and initial workup led to a TEE in 5/18 showing normal EF with moderate AS and moderate MR.  He continued to have episodic dyspnea and ended up admitted in 8/18 with shortness of breath and chest pressure.  This led to a long, complicated hospitalization.  He was noted to be volume overloaded with acute diastolic CHF and was also noted to have symptomatic runs of atrial fibrillation with RVR.  Amiodarone was started to control the atrial fibrillation.  He developed respiratory distress => Bipap => intubated.  He became hypotensive, requiring pressors.  He developed AKI.  PNA was noted and he received broad spectrum abx.  He had a prolonged intubation but was eventually extubated.  Given deconditioning from long hospitalization, he went to CIR for a couple of weeks.  LHC in 9/18 showed occluded PDA with left>right collaterals.  TEE in 9/18 showed EF 50%, severe MR (possibly infarct-related with restricted posterior leaflet, and moderate AS + moderate-severe AI (possibly rheumatic).   In 12/18, he had cardiac surgery with bioprosthetic aortic and mitral valves placed.  He had SVG-PDA, Maze, and LA appendage clipping.  Post-op course was complicated by CHB requiring Medtronic dual chamber PPM (His bundle lead placed but captured right bundle so induced LBBB).   Echo 01/02/18 LVEF 30-35%, bioprosthetic MV and AoV function normally, RV mildly dilated with normal systolic function.    He was noted to be in atrial flutter with mild RVR and had TEE-guided DCCV on 04/11/18. TEE showed EF 25-30%, normal bioprosthetic aortic and mitral valves.  Echo in 6/19 showed EF 30-35%, normal bioprosthetic aortic and mitral  valves.  Echo 12/19 with EF up to 50-55%, normal bioprosthetic mitral and aortic valves, normal RV.   In 2020, he went back into atrial fibrillation.  Amiodarone was started, and he returned to NSR.   Echo in 11/21 showed EF 50-55%, mild LVH, mildly dilated RV with normal function, bioprosthetic mitral valve with mean gradient 5 mmHg and no MR, bioprosthetic aortic valve with mean gradient 12 and no AI.  PFTs in 1/22 were restrictive.  High resolution CT chest was done in 2/22, concerning for ILD, usual interstitial pneumonitis.    He returns today for followup of CHF and valvular disease.  He is a-paced today.  He is short of breath walking about 50 yards.  He is limited more by knee pain.  No chest pain.  No orthopnea/PND.  No lightheadedness. Cannot tolerate CPAP.  Weight is stable.   ECG (personally reviewed): a-paced, old ASMI    Labs (9/18): K 4.5, creatinine 1.42, LDL 90, HDL 36, LFTs normal, TSH normal, hgb 9.6 Labs (10/18): K 4.3, creatinine 1.45 Labs (12/18): K 3.8, creatinine 1.56 Labs (1/19): creatinine 1.68, LDL 66 Labs (3/19): K 4.2, creatinine 1.56 Labs (4/19): K 4.4, creatinine 1.71, hgb 11.8, LDL 75, HDL 43 Labs (6/19): K 4.8, creatinine 2.1, LDL 75, TGs 246 Labs (8/19): K 4.6, creatinine 2.07 Labs (11/19): K 4.6, creatinine 1.29 Labs (2/20): K 4.8, creatinine 1.98 Labs (5/20): LDL 49, HDL 39 Labs (7/20): TSH normal Labs (9/20): TSH normal, K 4.4, creatinine 2.03, hgb 13.5, LFTs normal Labs (12/20): K 4.4, creatinine 1.87, hgb 13.1.  Labs (11/21): LDL 54,  K 4.6, creatinine 2.22, LFTs normal, TSH normal  PMH: 1. CVA: Right MCA, 2004.  Minimal residual problems.  2. HTN 3. Hyperlipidemia 4. Left rotator cuff surgery 6/18 5. Carotid stenosis: s/p right carotid stent.  6. GERD 7. H/o CCY 8. Anemia: FOBT negative.  9. Gout  10. Atrial fibrillation: Paroxysmal. Maze in 12/18 with LAA clipping.  - Recurrent atrial fibrillation 2020 => amiodarone.  11. Valvular heart  disease: TEE (5/18) with EF 55-60%, moderate AS mean 33 mmHg, moderate MR, normal RV size and systolic function.  - Echo (8/18): EF 55-60%, moderate LVH, moderate AS with mean gradient 33 mmHg, moderate AI, moderate to severe MR.  - TEE (9/18): EF 50%, mild LV dilation, suspect severe MR with posterior leaflet restricted (?infarct-related MR), ERO 0.4 cm^2, moderate AS/moderate-severe AI (possibly rheumatic).   - In 12/18, he had cardiac surgery with bioprosthetic aortic and mitral valves placed.  He had SVG-PDA, Maze, and LA appendage clipping. - Echo (1/19): LVEF 30-35%, bioprosthetic MV and AoV function normally, RV mildly dilated with normal systolic function.  - TEE (5/19) with EF 25-30%, normal bioprosthetic aortic valve, normal bioprosthetic mitral valve. - Echo (6/19): EF 30-35%, mild LV dilation with diffuse hypokinesis, normal RV size with mildly decreased systolic function, normal-appearing bioprosthetic mitral and aortic valves.   - Echo (12/19): EF 50-55%, moderate LVH, bioprosthetic aortic valve with mean gradient 10 mmHg, normally-functioning bioprosthetic mitral valve.  - Echo (11/21): EF 50-55%, mild LVH, mildly dilated RV with normal function, bioprosthetic mitral valve with mean gradient 5 mmHg and no MR, bioprosthetic aortic valve with mean gradient 12 and no AI.   12. Chronic systolic CHF 13. Deafness 14. CKD: stage 3.  15. 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  17. COPD: Mild obstruction on 9/18 PFTs.  - PFTs (10/20): Mild obstruction, decreased DLCO.  18. Post-op CHB in 12/18 with Medtronic dual chamber PPM.  19. Chronic systolic CHF: Echo (9/82) with EF down to 30-35% post-op.  - TEE (5/19) with EF 25-30%, normal bioprosthetic aortic valve, normal bioprosthetic mitral valve.  - Echo (12/19): EF 50-55%, moderate LVH, bioprosthetic  aortic valve with mean gradient 10 mmHg, normally-functioning bioprosthetic mitral valve.  20. Atrial flutter: DCCV in 5/19.  21. OSA: Not using CPAP.  22. Interstitial lung disease: PFTs in 1/22 concerning for restriction.  High resolution chest CT in 2/22 with possible UIP.   SH: Quit smoking 8/18.  Married, 2 children, retired.    Family History  Problem Relation Age of Onset  . Stroke Father        No details  . Angina Mother    Review of systems complete and found to be negative unless listed in HPI.    Current Outpatient Medications  Medication Sig Dispense Refill  . acetaminophen (TYLENOL) 325 MG tablet Take 1 tablet (325 mg total) by mouth every 6 (six) hours as needed for mild pain. 30 tablet 0  . amiodarone (PACERONE) 100 MG tablet Take 1 tablet (100 mg total) by mouth daily. Please make yearly appt with Dr. Lovena Le for December before anymore refills. 1st attempt 90 tablet 0  . amLODipine (NORVASC) 2.5 MG tablet TAKE 1 TABLET EVERY DAY 90 tablet 3  . calcium carbonate (TUMS - DOSED IN MG ELEMENTAL CALCIUM) 500 MG chewable tablet Chew 1 tablet by mouth daily as needed for indigestion or  heartburn.    . carvedilol (COREG) 12.5 MG tablet TAKE 1 TABLET TWICE DAILY WITH MEALS 180 tablet 3  . cetirizine (ZYRTEC) 10 MG tablet Take 10 mg by mouth daily as needed for allergies.    . citalopram (CELEXA) 20 MG tablet Take 1 tablet (20 mg total) by mouth at bedtime. 30 tablet 5  . docusate sodium (COLACE) 100 MG capsule Take 1 capsule (100 mg total) by mouth 3 (three) times daily as needed. 20 capsule 0  . furosemide (LASIX) 40 MG tablet Take 40 mg by mouth 2 (two) times daily.    [name] guaiFENesin (MUCINEX) 600 MG 12 hr tablet Take 600 mg by mouth 2 (two) times daily.    . methocarbamol (ROBAXIN) 500 MG tablet Take 1 tablet (500 mg total) by mouth every 8 (eight) hours as needed for muscle spasms. 30 tablet 0  . pantoprazole (PROTONIX) 40 MG tablet Take 1 tablet (40 mg total) by mouth daily.  30 tablet 1  . Pseudoeph-Doxylamine-DM-APAP (NYQUIL PO) Take by mouth as needed.    . rosuvastatin (CRESTOR) 10 MG tablet TAKE 1 TABLET EVERY DAY 90 tablet 3  . sacubitril-valsartan (ENTRESTO) 24-26 MG Take 1 tablet by mouth 2 (two) times daily. 180 tablet 3  . spironolactone (ALDACTONE) 25 MG tablet TAKE 1 TABLET EVERY DAY 90 tablet 3  . traZODone (DESYREL) 50 MG tablet Take 100 mg by mouth at bedtime.    [name] warfarin (COUMADIN) 5 MG tablet TAKE 1/2 TO 1 TABLET EVERY DAY AS DIRECTED BY THE COUMADIN CLINIC 90 tablet 0  . empagliflozin (JARDIANCE) 10 MG TABS tablet Take 1 tablet (10 mg total) by mouth daily before breakfast. 90 tablet 3   No current facility-administered medications for this encounter.   Vitals:   01/29/21 1344  BP: (!) 160/80  Pulse: 60  SpO2: 95%  Weight: 128.5 kg (283 lb 6.4 oz)   Wt Readings from Last 3 Encounters:  01/29/21 128.5 kg (283 lb 6.4 oz)  12/15/20 129.6 kg (285 lb 12.8 oz)  12/07/20 125.2 kg (276 lb)   General: NAD Neck: No JVD, no thyromegaly or thyroid nodule.  Lungs: Clear to auscultation bilaterally with normal respiratory effort. CV: Nondisplaced PMI.  Heart regular S1/S2, no S3/S4, 2/6 SEM RUSB.  No peripheral edema.  No carotid bruit.  Normal pedal pulses.  Abdomen: Soft, nontender, no hepatosplenomegaly, no distention.  Skin: Intact without lesions or rashes.  Neurologic: Alert and oriented x 3.  Psych: Normal affect. Extremities: No clubbing or cyanosis.  HEENT: Normal.   Assessment/Plan: 1. Valvular heart disease: TEE in 9/18 showed severe MR, probably infarct-related with restrictive posterior mitral leaflet. There was moderate aortic stenosis and moderate-severe aortic insufficiency, possible rheumatic.  In 12/18, he had bioprosthetic MVR and AVR. Valves stable on echo in 11/21.  - Antibiotic prophylaxis needed with dental work.  2. Chronic systolic => diastolic CHF: In setting of significant valvular disease as above. EF down to 30-35%  post-op (1/19 echo), likely reflects true LV systolic function without the volume load from severe MR and moderate-severe AI.  TEE in 5/19 showed EF 25-30%.  Repeat echo in 6/19 with EF 30-35%. Repeat echo in 12/19 showed EF up to 50-55%, echo in 11/21 again showed EF 50-55% with normal RV systolic function.  He is not volume overloaded on exam.  NYHA class III symptoms, think this may be due mostly to deconditioning and weight gain as well as possible interstitial lung disease.  - Continue Lasix 40 mg  bid, would not increase.  BMET today.     - Continue Coreg 12.5 mg bid.   - Continue Entresto 24/26 bid.  - Continue spironolactone 25 mg daily. - Start Jardiance 10 mg daily. BMET 10 days.  3. CKD: Stage 3.  - I will start him on Jardiance for diastolic CHF and CKD.  4. Atrial fibrillation/flutter: Paroxysmal. He has had a Maze procedure.  He tends to tolerate atrial arrhythmias poorly.  He had atrial flutter with DCCV in 5/19.  Recurrent atrial fibrillation in 2020, now on amiodarone and back in NSR.   - He is on warfarin.  - Continue amiodarone 100 mg daily today.  Check LFTs, TSH today.  He will need an eye exam, discussed today.  Finally, restrictive PFTs and CT chest suggestive of ILD (UIP).  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 He has been referred to pulmonary.  5. COPD: Mild obstruction on 10/20 PFTs, decreased DLCO.  He has quit smoking.   6. CVA: Remote, minimal residual. S/p carotid stenting.  - No ASA given warfarin use.  7. Hyperlipidemia: Check lipids today.      8. Anemia: Fe deficiency.   9. 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  The artery to the PDA was occluded with left to right collaterals.  He had SVG-PDA in 12/18. No chest pain.  - Continue statin, good LDL in 11/21.    - No ASA given warfarin use.  10. CHB: has Medtronic PPM.  He a-paces a high percentage of  the time but there is minimal RV pacing.  11. OSA: Severe OSA, he does not want to use CPAP.  12. Obesity: We discussed diet/exercise for weight loss.  13. HTN: BP elevated.  - Increase amlodipine to 5 mg daily.  14. Interstitial lung disease: Restrictive PFTs in 1/22.  High resolution CT chest concerning for ILD, possibly usual interstitial pneumonitis. ?Amiodarone lung toxicity though he is on low dose (100 mg daily).  - Check ESR.  - He has pulmonary evaluation with Dr. Vaughan Browner in March.  Followup in 3 wks with APP.    Loralie Champagne 01/31/2021

## 2021-02-01 ENCOUNTER — Telehealth (HOSPITAL_COMMUNITY): Payer: Self-pay

## 2021-02-01 MED ORDER — AMLODIPINE BESYLATE 5 MG PO TABS
5.0000 mg | ORAL_TABLET | Freq: Every day | ORAL | 3 refills | Status: DC
Start: 2021-02-01 — End: 2021-03-24

## 2021-02-01 NOTE — Telephone Encounter (Signed)
Meds ordered this encounter  Medications  . amLODipine (NORVASC) 5 MG tablet    Sig: Take 1 tablet (5 mg total) by mouth daily.    Dispense:  90 tablet    Refill:  3    Please cancel all previous orders for current medication. Change in dosage or pill size.   Per Dr. Loralie Champagne have patient increase Amlodipine to '5mg'$  QD. Patients wife made aware,New Rx sent into patients pharmacy.

## 2021-02-07 ENCOUNTER — Encounter (HOSPITAL_BASED_OUTPATIENT_CLINIC_OR_DEPARTMENT_OTHER): Payer: Medicare HMO | Admitting: Cardiology

## 2021-02-08 ENCOUNTER — Other Ambulatory Visit: Payer: Self-pay

## 2021-02-08 ENCOUNTER — Ambulatory Visit (HOSPITAL_COMMUNITY)
Admission: RE | Admit: 2021-02-08 | Discharge: 2021-02-08 | Disposition: A | Discharge status: Home / Self Care | Payer: Medicare HMO | Source: Ambulatory Visit | Attending: Internal Medicine | Admitting: Internal Medicine

## 2021-02-08 DIAGNOSIS — I5022 Chronic systolic (congestive) heart failure: Secondary | ICD-10-CM | POA: Diagnosis not present

## 2021-02-08 LAB — BASIC METABOLIC PANEL
Anion gap: 13 (ref 5–15)
BUN: 26 mg/dL — ABNORMAL HIGH (ref 8–23)
CO2: 23 mmol/L (ref 22–32)
Calcium: 8.7 mg/dL — ABNORMAL LOW (ref 8.9–10.3)
Chloride: 103 mmol/L (ref 98–111)
Creatinine, Ser: 1.78 mg/dL — ABNORMAL HIGH (ref 0.61–1.24)
GFR, Estimated: 40 mL/min — ABNORMAL LOW (ref >60)
Glucose, Bld: 99 mg/dL (ref 70–99)
Potassium: 4.1 mmol/L (ref 3.5–5.1)
Sodium: 139 mmol/L (ref 135–145)

## 2021-02-09 MED FILL — JARDIANCE 10 MG TABLET: 10 | 90 days supply | Qty: 90 | Fill #0

## 2021-02-11 ENCOUNTER — Ambulatory Visit (INDEPENDENT_AMBULATORY_CARE_PROVIDER_SITE_OTHER): Payer: Medicare HMO | Admitting: *Deleted

## 2021-02-11 ENCOUNTER — Other Ambulatory Visit: Payer: Self-pay

## 2021-02-11 DIAGNOSIS — Z5181 Encounter for therapeutic drug level monitoring: Secondary | ICD-10-CM

## 2021-02-11 DIAGNOSIS — Z9889 Other specified postprocedural states: Secondary | ICD-10-CM

## 2021-02-11 DIAGNOSIS — Z8679 Personal history of other diseases of the circulatory system: Secondary | ICD-10-CM

## 2021-02-11 DIAGNOSIS — Z953 Presence of xenogenic heart valve: Secondary | ICD-10-CM

## 2021-02-11 DIAGNOSIS — Z951 Presence of aortocoronary bypass graft: Secondary | ICD-10-CM

## 2021-02-11 DIAGNOSIS — I480 Paroxysmal atrial fibrillation: Secondary | ICD-10-CM

## 2021-02-11 LAB — POCT INR: INR: 4 — AB (ref 2.0–3.0)

## 2021-02-11 NOTE — Patient Instructions (Signed)
Description   Do not take any Warfarin today then continue taking 1/2 tablet daily except for 1 tablet on Mondays, Wednesdays, and Fridays. Recheck INR in 2 weeks (normally 6 weeks).  Call with any new medications, scheduled procedures or any questions  [phone]

## 2021-02-22 ENCOUNTER — Encounter (HOSPITAL_COMMUNITY): Payer: Self-pay

## 2021-02-22 ENCOUNTER — Other Ambulatory Visit (HOSPITAL_COMMUNITY): Payer: Self-pay | Admitting: Cardiology

## 2021-02-22 ENCOUNTER — Other Ambulatory Visit: Payer: Self-pay

## 2021-02-22 ENCOUNTER — Ambulatory Visit (HOSPITAL_COMMUNITY)
Admission: RE | Admit: 2021-02-22 | Discharge: 2021-02-22 | Disposition: A | Discharge status: Home / Self Care | Payer: Medicare HMO | Source: Ambulatory Visit | Attending: Cardiology | Admitting: Cardiology

## 2021-02-22 VITALS — BP 140/70 | HR 61 | Wt 286.2 lb

## 2021-02-22 DIAGNOSIS — I5042 Chronic combined systolic (congestive) and diastolic (congestive) heart failure: Secondary | ICD-10-CM | POA: Diagnosis not present

## 2021-02-22 DIAGNOSIS — I38 Endocarditis, valve unspecified: Secondary | ICD-10-CM | POA: Insufficient documentation

## 2021-02-22 DIAGNOSIS — Z87891 Personal history of nicotine dependence: Secondary | ICD-10-CM | POA: Diagnosis not present

## 2021-02-22 DIAGNOSIS — J449 Chronic obstructive pulmonary disease, unspecified: Secondary | ICD-10-CM | POA: Insufficient documentation

## 2021-02-22 DIAGNOSIS — N183 Chronic kidney disease, stage 3 unspecified: Secondary | ICD-10-CM | POA: Diagnosis not present

## 2021-02-22 DIAGNOSIS — E785 Hyperlipidemia, unspecified: Secondary | ICD-10-CM | POA: Insufficient documentation

## 2021-02-22 DIAGNOSIS — Z8673 Personal history of transient ischemic attack (TIA), and cerebral infarction without residual deficits: Secondary | ICD-10-CM | POA: Diagnosis not present

## 2021-02-22 DIAGNOSIS — I480 Paroxysmal atrial fibrillation: Secondary | ICD-10-CM | POA: Insufficient documentation

## 2021-02-22 DIAGNOSIS — Z79899 Other long term (current) drug therapy: Secondary | ICD-10-CM | POA: Diagnosis not present

## 2021-02-22 DIAGNOSIS — I2510 Atherosclerotic heart disease of native coronary artery without angina pectoris: Secondary | ICD-10-CM | POA: Insufficient documentation

## 2021-02-22 DIAGNOSIS — Z7901 Long term (current) use of anticoagulants: Secondary | ICD-10-CM | POA: Insufficient documentation

## 2021-02-22 DIAGNOSIS — I130 Hypertensive heart and chronic kidney disease with heart failure and stage 1 through stage 4 chronic kidney disease, or unspecified chronic kidney disease: Secondary | ICD-10-CM | POA: Insufficient documentation

## 2021-02-22 DIAGNOSIS — J849 Interstitial pulmonary disease, unspecified: Secondary | ICD-10-CM | POA: Insufficient documentation

## 2021-02-22 DIAGNOSIS — Z6839 Body mass index (BMI) 39.0-39.9, adult: Secondary | ICD-10-CM | POA: Diagnosis not present

## 2021-02-22 DIAGNOSIS — D509 Iron deficiency anemia, unspecified: Secondary | ICD-10-CM | POA: Insufficient documentation

## 2021-02-22 DIAGNOSIS — I4892 Unspecified atrial flutter: Secondary | ICD-10-CM | POA: Diagnosis not present

## 2021-02-22 DIAGNOSIS — Z953 Presence of xenogenic heart valve: Secondary | ICD-10-CM | POA: Insufficient documentation

## 2021-02-22 DIAGNOSIS — E669 Obesity, unspecified: Secondary | ICD-10-CM | POA: Diagnosis not present

## 2021-02-22 DIAGNOSIS — I5022 Chronic systolic (congestive) heart failure: Secondary | ICD-10-CM

## 2021-02-22 DIAGNOSIS — R0602 Shortness of breath: Secondary | ICD-10-CM | POA: Insufficient documentation

## 2021-02-22 DIAGNOSIS — I352 Nonrheumatic aortic (valve) stenosis with insufficiency: Secondary | ICD-10-CM | POA: Insufficient documentation

## 2021-02-22 DIAGNOSIS — Z7984 Long term (current) use of oral hypoglycemic drugs: Secondary | ICD-10-CM | POA: Insufficient documentation

## 2021-02-22 DIAGNOSIS — G4733 Obstructive sleep apnea (adult) (pediatric): Secondary | ICD-10-CM | POA: Insufficient documentation

## 2021-02-22 MED ORDER — ENTRESTO 49-51 MG PO TABS: 1.0000 | ORAL_TABLET | Freq: Two times a day (BID) | ORAL | 6 refills | Status: DC

## 2021-02-22 NOTE — Progress Notes (Signed)
Advanced Heart Failure Clinic Note   PCP: Dr. Justin Mend Cardiology: Dr. Radford Pax HF Cardiology: Dr. Aundra Dubin  75 y.o.with history of CVA, paroxysmal atrial fibrillation, valvular heart disease, and chronic diastolic CHF presents for evaluation of CHF and valvular heart disease.    He had had episodes of dyspnea and initial workup led to a TEE in 5/18 showing normal EF with moderate AS and moderate MR.  He continued to have episodic dyspnea and ended up admitted in 8/18 with shortness of breath and chest pressure.  This led to a long, complicated hospitalization.  He was noted to be volume overloaded with acute diastolic CHF and was also noted to have symptomatic runs of atrial fibrillation with RVR.  Amiodarone was started to control the atrial fibrillation.  He developed respiratory distress => Bipap => intubated.  He became hypotensive, requiring pressors.  He developed AKI.  PNA was noted and he received broad spectrum abx.  He had a prolonged intubation but was eventually extubated.  Given deconditioning from long hospitalization, he went to CIR for a couple of weeks.  LHC in 9/18 showed occluded PDA with left>right collaterals.  TEE in 9/18 showed EF 50%, severe MR (possibly infarct-related with restricted posterior leaflet, and moderate AS + moderate-severe AI (possibly rheumatic).   In 12/18, he had cardiac surgery with bioprosthetic aortic and mitral valves placed.  He had SVG-PDA, Maze, and LA appendage clipping.  Post-op course was complicated by CHB requiring Medtronic dual chamber PPM (His bundle lead placed but captured right bundle so induced LBBB).   Echo 01/02/18 LVEF 30-35%, bioprosthetic MV and AoV function normally, RV mildly dilated with normal systolic function.    He was noted to be in atrial flutter with mild RVR and had TEE-guided DCCV on 04/11/18. TEE showed EF 25-30%, normal bioprosthetic aortic and mitral valves.  Echo in 6/19 showed EF 30-35%, normal bioprosthetic aortic and mitral  valves.  Echo 12/19 with EF up to 50-55%, normal bioprosthetic mitral and aortic valves, normal RV.   In 2020, he went back into atrial fibrillation.  Amiodarone was started, and he returned to NSR.   Echo in 11/21 showed EF 50-55%, mild LVH, mildly dilated RV with normal function, bioprosthetic mitral valve with mean gradient 5 mmHg and no MR, bioprosthetic aortic valve with mean gradient 12 and no AI.  PFTs in 1/22 were restrictive.  High resolution CT chest was done in 2/22, concerning for ILD, usual interstitial pneumonitis.    He was recently seen by Dr. Aundra Dubin on 2/18 and complained of increased dyspnea, SOB walking ~50 feet, but was not volume overloaded on exam. BP was mildly elevated. EKG was a-paced. Hgb showed mild anemia, Hgb 1.24.  Jardiance 10 mg was added and amlodipine was increased to 5 mg daily. He was also referred to Pulmonology (Dr. Vaughan Browner) for ILD. There was ? Regarding possible amiodarone toxicity, however ESR was checked and though elevated, was not markedly high at 46. His consult w/ Dr. Vaughan Browner is 3.16.  He presents back to clinic today for f/u. Here w/ his wife. Wt is actually up 3 lb from previous visit. Remains SOB, NYHA Class III. ReDs clip mildly elevated at 38%. BP 140/70.     ECG: not performed today   Labs (9/18): K 4.5, creatinine 1.42, LDL 90, HDL 36, LFTs normal, TSH normal, hgb 9.6 Labs (10/18): K 4.3, creatinine 1.45 Labs (12/18): K 3.8, creatinine 1.56 Labs (1/19): creatinine 1.68, LDL 66 Labs (3/19): K 4.2, creatinine 1.56 Labs (4/19):  K 4.4, creatinine 1.71, hgb 11.8, LDL 75, HDL 43 Labs (6/19): K 4.8, creatinine 2.1, LDL 75, TGs 246 Labs (8/19): K 4.6, creatinine 2.07 Labs (11/19): K 4.6, creatinine 1.29 Labs (2/20): K 4.8, creatinine 1.98 Labs (5/20): LDL 49, HDL 39 Labs (7/20): TSH normal Labs (9/20): TSH normal, K 4.4, creatinine 2.03, hgb 13.5, LFTs normal Labs (12/20): K 4.4, creatinine 1.87, hgb 13.1.  Labs (11/21): LDL 54, K 4.6, creatinine  2.22, LFTs normal, TSH normal Labs (2/22): SCr 1.77, K 4.2, LFTs normal, TSH normal, ESR 46, Hgb 12.4    PMH: 1. CVA: Right MCA, 2004.  Minimal residual problems.  2. HTN 3. Hyperlipidemia 4. Left rotator cuff surgery 6/18 5. Carotid stenosis: s/p right carotid stent.  6. GERD 7. H/o CCY 8. Anemia: FOBT negative.  9. Gout  10. Atrial fibrillation: Paroxysmal. Maze in 12/18 with LAA clipping.  - Recurrent atrial fibrillation 2020 => amiodarone.  11. Valvular heart disease: TEE (5/18) with EF 55-60%, moderate AS mean 33 mmHg, moderate MR, normal RV size and systolic function.  - Echo (8/18): EF 55-60%, moderate LVH, moderate AS with mean gradient 33 mmHg, moderate AI, moderate to severe MR.  - TEE (9/18): EF 50%, mild LV dilation, suspect severe MR with posterior leaflet restricted (?infarct-related MR), ERO 0.4 cm^2, moderate AS/moderate-severe AI (possibly rheumatic).   - In 12/18, he had cardiac surgery with bioprosthetic aortic and mitral valves placed.  He had SVG-PDA, Maze, and LA appendage clipping. - Echo (1/19): LVEF 30-35%, bioprosthetic MV and AoV function normally, RV mildly dilated with normal systolic function.  - TEE (5/19) with EF 25-30%, normal bioprosthetic aortic valve, normal bioprosthetic mitral valve. - Echo (6/19): EF 30-35%, mild LV dilation with diffuse hypokinesis, normal RV size with mildly decreased systolic function, normal-appearing bioprosthetic mitral and aortic valves.   - Echo (12/19): EF 50-55%, moderate LVH, bioprosthetic aortic valve with mean gradient 10 mmHg, normally-functioning bioprosthetic mitral valve.  - Echo (11/21): EF 50-55%, mild LVH, mildly dilated RV with normal function, bioprosthetic mitral valve with mean gradient 5 mmHg and no MR, bioprosthetic aortic valve with mean gradient 12 and no AI.   12. Chronic systolic CHF 13. Deafness 14. CKD: stage 3.  15. Suspect COPD 16. 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  17. COPD: Mild obstruction on 9/18 PFTs.  - PFTs (10/20): Mild obstruction, decreased DLCO.  18. Post-op CHB in 12/18 with Medtronic dual chamber PPM.  19. Chronic systolic CHF: Echo (0/62) with EF down to 30-35% post-op.  - TEE (5/19) with EF 25-30%, normal bioprosthetic aortic valve, normal bioprosthetic mitral valve.  - Echo (12/19): EF 50-55%, moderate LVH, bioprosthetic aortic valve with mean gradient 10 mmHg, normally-functioning bioprosthetic mitral valve.  20. Atrial flutter: DCCV in 5/19.  21. OSA: Not using CPAP.  22. Interstitial lung disease: PFTs in 1/22 concerning for restriction.  High resolution chest CT in 2/22 with possible UIP.   SH: Quit smoking 8/18.  Married, 2 children, retired.    Family History  Problem Relation Age of Onset  . Stroke Father        No details  . Angina Mother    Review of systems complete and found to be negative unless listed in HPI.    Current Outpatient Medications  Medication Sig Dispense Refill  . acetaminophen (TYLENOL) 325 MG tablet Take 1 tablet (325 mg  total) by mouth every 6 (six) hours as needed for mild pain. 30 tablet 0  . amiodarone (PACERONE) 100 MG tablet Take 1 tablet (100 mg total) by mouth daily. Please make yearly appt with Dr. Lovena Le for December before anymore refills. 1st attempt 90 tablet 0  . amLODipine (NORVASC) 5 MG tablet Take 1 tablet (5 mg total) by mouth daily. 90 tablet 3  . calcium carbonate (TUMS - DOSED IN MG ELEMENTAL CALCIUM) 500 MG chewable tablet Chew 1 tablet by mouth daily as needed for indigestion or heartburn.    . carvedilol (COREG) 12.5 MG tablet TAKE 1 TABLET TWICE DAILY WITH MEALS 180 tablet 3  . cetirizine (ZYRTEC) 10 MG tablet Take 10 mg by mouth daily as needed for allergies.    . citalopram (CELEXA) 20 MG tablet Take 1 tablet (20 mg total) by mouth at bedtime. 30 tablet 5  . docusate sodium  (COLACE) 100 MG capsule Take 1 capsule (100 mg total) by mouth 3 (three) times daily as needed. 20 capsule 0  . empagliflozin (JARDIANCE) 10 MG TABS tablet Take 1 tablet (10 mg total) by mouth daily before breakfast. 90 tablet 3  . EPINEPHrine (EPIPEN JR) 0.15 MG/0.3ML injection     . furosemide (LASIX) 40 MG tablet Take 40 mg by mouth 2 (two) times daily.    [name] guaiFENesin (MUCINEX) 600 MG 12 hr tablet Take 600 mg by mouth 2 (two) times daily.    . methocarbamol (ROBAXIN) 500 MG tablet Take 1 tablet (500 mg total) by mouth every 8 (eight) hours as needed for muscle spasms. 30 tablet 0  . pantoprazole (PROTONIX) 40 MG tablet Take 1 tablet (40 mg total) by mouth daily. 30 tablet 1  . Pseudoeph-Doxylamine-DM-APAP (NYQUIL PO) Take by mouth as needed.    . rosuvastatin (CRESTOR) 10 MG tablet TAKE 1 TABLET EVERY DAY 90 tablet 3  . sacubitril-valsartan (ENTRESTO) 24-26 MG Take 1 tablet by mouth 2 (two) times daily. 180 tablet 3  . spironolactone (ALDACTONE) 25 MG tablet TAKE 1 TABLET EVERY DAY 90 tablet 3  . traZODone (DESYREL) 50 MG tablet Take 100 mg by mouth at bedtime.    [name] warfarin (COUMADIN) 5 MG tablet TAKE 1/2 TO 1 TABLET EVERY DAY AS DIRECTED BY THE COUMADIN CLINIC 90 tablet 0   No current facility-administered medications for this encounter.   Vitals:   02/22/21 1341  BP: 140/70  Pulse: 61  SpO2: 93%  Weight: 129.8 kg (286 lb 3.2 oz)   Wt Readings from Last 3 Encounters:  02/22/21 129.8 kg (286 lb 3.2 oz)  01/29/21 128.5 kg (283 lb 6.4 oz)  12/15/20 129.6 kg (285 lb 12.8 oz)     PHYSICAL EXAM: General:  Well appearing, obese. No respiratory difficulty HEENT: normal Neck: supple. JVD 7 cm. Carotids 2+ bilat; no bruits. No lymphadenopathy or thyromegaly appreciated. Cor: PMI nondisplaced. Regular rate & rhythm. No rubs or  Gallops  +2/6 SEM RUSB Lungs: expiratory rhonchi  Abdomen: soft, nontender, nondistended. No hepatosplenomegaly. No bruits or masses. Good bowel  sounds. Extremities: no cyanosis, clubbing, rash, trace bilateral LE edema Neuro: alert & oriented x 3, cranial nerves grossly intact. moves all 4 extremities w/o difficulty. Affect pleasant.   Assessment/Plan: 1. Valvular heart disease: TEE in 9/18 showed severe MR, probably infarct-related with restrictive posterior mitral leaflet. There was moderate aortic stenosis and moderate-severe aortic insufficiency, possible rheumatic.  In 12/18, he had bioprosthetic MVR and AVR. Valves stable on echo in 11/21.  - Needs  SBE prophylaxis  with dental work.  2. Chronic systolic => diastolic CHF: In setting of significant valvular disease as above. EF down to 30-35% post-op (1/19 echo), likely reflects true LV systolic function without the volume load from severe MR and moderate-severe AI.  TEE in 5/19 showed EF 25-30%.  Repeat echo in 6/19 with EF 30-35%. Repeat echo in 12/19 showed EF up to 50-55%, echo in 11/21 again showed EF 50-55% with normal RV systolic function. NYHA class III symptoms, think this may be due mostly to deconditioning and weight gain as well as possible interstitial lung disease but he is also mildly fluid overloaded, ReDs Clip 38%. - Increase Entresto to 49-51 mg bid    -Continue Lasix 40 mg bid   - Continue Coreg 12.5 mg bid.   - Continue spironolactone 25 mg daily. - Continue Jardiance 10 mg daily.  - Check BMP today and again in 7-10 days  3. CKD: Stage 3.  - Continue Jardiance for diastolic CHF and CKD.  - BMP today and in 7-10 days w/ Entresto increase  4. Atrial fibrillation/flutter: Paroxysmal. He has had a Maze procedure.  He tends to tolerate atrial arrhythmias poorly.  He had atrial flutter with DCCV in 5/19.  Recurrent atrial fibrillation in 2020, now on amiodarone and back in NSR. Last occurrence, per device interrogation, was ~02/02/21.   - He is on warfarin. INRs followed by [facility]  - Continue amiodarone 100 mg daily today.  TFTs and HFTs ok 2/22.  He will  need annual eye exams.  Finally, restrictive PFTs and CT chest suggestive of ILD (UIP).  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 Though ESR not markedly high (46). He has been referred to pulmonary. Has appt this week, 3/16 5. COPD: Mild obstruction on 10/20 PFTs, decreased DLCO.  He has quit smoking.   6. CVA: Remote, minimal residual. S/p carotid stenting.  - No ASA given warfarin use.  7. Hyperlipidemia: recent lipid panel 11/21 w/ LDL at goal at 54 mg/dL  - Continue Crestor. Recent HFTs normal  8. Anemia: Fe deficiency.  Recent Hgb ok at 12.4. denies melena/ hematochezia  9. 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  The artery to the PDA was occluded with left to right collaterals.  He had SVG-PDA in 12/18. Stable w/o CP   - Continue statin, good LDL in 11/21.   - No ASA given warfarin use.  10. CHB: has Medtronic PPM.  He a-paces a high percentage of the time but there is minimal RV pacing.  11. OSA: Severe OSA, he does not want to use CPAP.  12. Obesity: Body mass index is 39.92 kg/m. - We discussed diet/exercise for weight loss.  13. HTN: mildly elevated, SBP 140 - Increase Entresto to 49-51 mg bid  14. Interstitial lung disease: Restrictive PFTs in 1/22.  High resolution CT chest concerning for ILD, possibly usual interstitial pneumonitis. ?Amiodarone lung toxicity though he is on low dose (100 mg daily).  -  ESR recently checked, elevated but not markedly high at 46 - He has pulmonary evaluation with Dr. Vaughan Browner later this week on 3/16    f/u w/ APP in 4 weeks   Lyda Jester, PA-C  02/22/2021

## 2021-02-22 NOTE — Patient Instructions (Signed)
INCREASE Entresto to 49/51 mg, one tab twice a day  Labs today We will only contact you if something comes back abnormal or we need to make some changes. Otherwise no news is good news!  Labs needed in 7-10 days  Your physician recommends that you schedule a follow-up appointment in: 3-4 weeks  in the Advanced Practitioners (PA/NP) Clinic    Do the following things EVERYDAY: 1) Weigh yourself in the morning before breakfast. Write it down and keep it in a log. 2) Take your medicines as prescribed 3) Eat low salt foods--Limit salt (sodium) to 2000 mg per day.  4) Stay as active as you can everyday 5) Limit all fluids for the day to less than 2 liters  At the [facility], you and your health needs are our priority. As part of our continuing mission to provide you with exceptional heart care, we have created designated Provider Care Teams. These Care Teams include your primary Cardiologist (physician) and Advanced Practice Providers (APPs- Physician Assistants and Nurse Practitioners) who all work together to provide you with the care you need, when you need it.   You may see any of the following providers on your designated Care Team at your next follow up: [name] Dr Glori Bickers . Dr Loralie Champagne . Dr Vickki Muff . Darrick Grinder, NP . Lyda Jester, [place] . Audry Riles, PharmD   Please be sure to bring in all your medications bottles to every appointment.   If you have any questions or concerns before your next appointment please send [place] a message through [place] or call our office at [phone].    TO LEAVE A MESSAGE FOR THE NURSE SELECT OPTION 2, PLEASE LEAVE A MESSAGE INCLUDING: . YOUR NAME . DATE OF BIRTH . CALL BACK NUMBER . REASON FOR CALL**this is important as we prioritize the call backs  YOU WILL RECEIVE A CALL BACK THE SAME DAY AS LONG AS YOU CALL BEFORE 4:00 PM  Please see our updated No Show and Same Day Appointment  Cancellation Policy attached to your AVS.

## 2021-02-24 ENCOUNTER — Other Ambulatory Visit: Payer: Self-pay

## 2021-02-24 ENCOUNTER — Encounter: Payer: Self-pay | Admitting: Pulmonary Disease

## 2021-02-24 ENCOUNTER — Ambulatory Visit: Payer: Medicare HMO | Admitting: Pulmonary Disease

## 2021-02-24 VITALS — BP 130/64 | HR 60 | Temp 97.7°F | Ht 71.0 in | Wt 287.0 lb

## 2021-02-24 DIAGNOSIS — J849 Interstitial pulmonary disease, unspecified: Secondary | ICD-10-CM

## 2021-02-24 LAB — CBC WITH DIFFERENTIAL/PLATELET
Basophils Absolute: 0 10*3/uL (ref 0.0–0.1)
Basophils Relative: 0.6 % (ref 0.0–3.0)
Eosinophils Absolute: 0.1 10*3/uL (ref 0.0–0.7)
Eosinophils Relative: 1.7 % (ref 0.0–5.0)
HCT: 37 % — ABNORMAL LOW (ref 39.0–52.0)
Hemoglobin: 12.3 g/dL — ABNORMAL LOW (ref 13.0–17.0)
Lymphocytes Relative: 8 % — ABNORMAL LOW (ref 12.0–46.0)
Lymphs Abs: 0.6 10*3/uL — ABNORMAL LOW (ref 0.7–4.0)
MCHC: 33.1 g/dL (ref 30.0–36.0)
MCV: 103.5 fl — ABNORMAL HIGH (ref 78.0–100.0)
Monocytes Absolute: 0.7 10*3/uL (ref 0.1–1.0)
Monocytes Relative: 9.8 % (ref 3.0–12.0)
Neutro Abs: 6 10*3/uL (ref 1.4–7.7)
Neutrophils Relative %: 79.9 % — ABNORMAL HIGH (ref 43.0–77.0)
Platelets: 229 10*3/uL (ref 150.0–400.0)
RBC: 3.57 Mil/uL — ABNORMAL LOW (ref 4.22–5.81)
RDW: 15.1 % (ref 11.5–15.5)
WBC: 7.5 10*3/uL (ref 4.0–10.5)

## 2021-02-24 MED ORDER — TRELEGY ELLIPTA 200-62.5-25 MCG/INH IN AEPB: 1.0000 | INHALATION_SPRAY | Freq: Every day | RESPIRATORY_TRACT | 0 refills | Status: DC

## 2021-02-24 NOTE — Addendum Note (Signed)
Addended by: Elton Sin on: 02/24/2021 03:02 PM   Modules accepted: Orders

## 2021-02-24 NOTE — Progress Notes (Signed)
Brandon [name], thanks for seeing him.  I think it would be ok to try him off amiodarone, could give him a course of steroids if you think it would be helpful.   Heather/Philicia, could you talk to Brandon [name] and have him stop amiodarone for the time being? Thanks.

## 2021-02-24 NOTE — Patient Instructions (Signed)
I will check some blood test today to evaluate the inflammation in the lung Check CBC differential, IgE  Start Trelegy 200 inhaler  Follow-up in 1 to 2 months.

## 2021-02-24 NOTE — Progress Notes (Deleted)
Brandon [name]    CS:[id number]    June 16, 1946  Primary Care Physician:Webb, Arbie Cookey, MD  Referring Physician: Larey Dresser, MD [phone],  [place] [id number]  Chief complaint:  ***  HPI:  ***  Pets: Occupation: Exposures: Smoking history: Travel history: Relevant family history:   Outpatient Encounter Medications as of 02/24/2021  Medication Sig  . acetaminophen (TYLENOL) 325 MG tablet Take 1 tablet (325 mg total) by mouth every 6 (six) hours as needed for mild pain.  [name] amiodarone (PACERONE) 100 MG tablet Take 1 tablet (100 mg total) by mouth daily. Please make yearly appt with Dr. Lovena Le for December before anymore refills. 1st attempt  . amLODipine (NORVASC) 5 MG tablet Take 1 tablet (5 mg total) by mouth daily.  . calcium carbonate (TUMS - DOSED IN MG ELEMENTAL CALCIUM) 500 MG chewable tablet Chew 1 tablet by mouth daily as needed for indigestion or heartburn.  . carvedilol (COREG) 12.5 MG tablet TAKE 1 TABLET TWICE DAILY WITH MEALS  . cetirizine (ZYRTEC) 10 MG tablet Take 10 mg by mouth daily as needed for allergies.  . citalopram (CELEXA) 20 MG tablet Take 1 tablet (20 mg total) by mouth at bedtime.  . docusate sodium (COLACE) 100 MG capsule Take 1 capsule (100 mg total) by mouth 3 (three) times daily as needed.  . empagliflozin (JARDIANCE) 10 MG TABS tablet Take 1 tablet (10 mg total) by mouth daily before breakfast.  . EPINEPHrine (EPIPEN JR) 0.15 MG/0.3ML injection   . furosemide (LASIX) 40 MG tablet Take 40 mg by mouth 2 (two) times daily.  [name] guaiFENesin (MUCINEX) 600 MG 12 hr tablet Take 600 mg by mouth 2 (two) times daily.  . methocarbamol (ROBAXIN) 500 MG tablet Take 1 tablet (500 mg total) by mouth every 8 (eight) hours as needed for muscle spasms.  . pantoprazole (PROTONIX) 40 MG tablet Take 1 tablet (40 mg total) by mouth daily.  . Pseudoeph-Doxylamine-DM-APAP (NYQUIL PO) Take by mouth as needed.  . rosuvastatin (CRESTOR) 10  MG tablet TAKE 1 TABLET EVERY DAY  . sacubitril-valsartan (ENTRESTO) 49-51 MG Take 1 tablet by mouth 2 (two) times daily.  [name] spironolactone (ALDACTONE) 25 MG tablet TAKE 1 TABLET EVERY DAY  . traZODone (DESYREL) 50 MG tablet Take 100 mg by mouth at bedtime.  [name] warfarin (COUMADIN) 5 MG tablet TAKE 1/2 TO 1 TABLET EVERY DAY AS DIRECTED BY THE COUMADIN CLINIC   No facility-administered encounter medications on file as of 02/24/2021.    Allergies as of 02/24/2021 - Review Complete 02/24/2021  Allergen Reaction Noted  . Bee venom Anaphylaxis and Swelling 11/14/2011    Past Medical History:  Diagnosis Date  . Anemia 07/13/2017  . Anxiety   . Aortic insufficiency   . Aortic stenosis, moderate 07/13/2017  . Arthritis    back   . Carotid stenosis    Right carotid stent (widely patent) 40 - 59% left plaque 11/13  . CHF (congestive heart failure) ([place])   . Chronic kidney disease   . COPD (chronic obstructive pulmonary disease) ([place])   . Coronary artery disease involving native coronary artery of native heart without angina pectoris   . Depression   . Dyslipidemia   . GERD (gastroesophageal reflux disease)   . Heart murmur   . Hemiplegia affecting unspecified side, late effect of cerebrovascular disease    resolved- from L side   . Hypertension   . Jaundice  resolved following ERCP & Cholecystectomy  . Mild emphysema ([place])   . Mitral regurgitation   . Mitral valve insufficiency and aortic valve insufficiency   . Myocardial infarction ([place])   . Paroxysmal atrial fibrillation ([facility])   . Pre-diabetes    per spouse  . S/P aortic valve replacement with bioprosthetic valve 11/16/2017   23 mm [facility] Ease stented bovine pericardial tissue valve  . S/P CABG x 1 11/16/2017   SVG to PDA with EVH via right thigh  . S/P Maze operation for atrial fibrillation 11/16/2017   Left side lesion set using bipolar radiofrequency and cryothermy ablation with clipping of LA appendage  . S/P mitral  valve replacement with bioprosthetic valve 11/16/2017   27 mm [facility] Mitral stented bovine pericardial tissue valve  . Sleep apnea    does not wear CPAP  . Sleep concern    resulted in surgery- after + sleep test. Pt. doesn't have a problem any longer.   . Stroke ([place]) 03/11/2003   stent placed on the 31, 3, 2004, L side   . Wears glasses   . Wears hearing aid in both ears   . Wears partial dentures     Past Surgical History:  Procedure Laterality Date  . AORTIC VALVE REPLACEMENT N/A 11/16/2017   Procedure: AORTIC VALVE REPLACEMENT (AVR) with 13m Magna Ease;  Surgeon: ORexene Alberts MD;  [facility]  Service: Open Heart Surgery;  Laterality: N/A;  . ARTERIAL LINE INSERTION Right 11/16/2017   Procedure: ARTERIAL LINE INSERTION -RIGHT FEMORAL;  Surgeon: ORexene Alberts MD;  [facility]  Service: Open Heart Surgery;  Laterality: Right;  . BACK SURGERY     lumbar back  . CARDIOVERSION N/A 04/11/2018   Procedure: CARDIOVERSION;  Surgeon: MLarey Dresser MD;  [facility]  Service: Cardiovascular;  Laterality: N/A;  . CHOLECYSTECTOMY    . CORONARY ARTERY BYPASS GRAFT N/A 11/16/2017   Procedure: CORONARY ARTERY BYPASS GRAFTING (CABG) x 1;  Surgeon: ORexene Alberts MD;  [facility]  Service: Open Heart Surgery;  Laterality: N/A;  . ENDOVEIN HARVEST OF GREATER SAPHENOUS VEIN Right 11/16/2017   Procedure: ENDOVEIN HARVEST OF GREATER SAPHENOUS VEIN;  Surgeon: ORexene Alberts MD;  [place] [facility]  Service: Open Heart Surgery;  Laterality: Right;  . ERCP N/A 05/31/2013   Procedure: ENDOSCOPIC RETROGRADE CHOLANGIOPANCREATOGRAPHY (ERCP);  Surgeon: MLadene Artist MD;  [facility];  Service: Endoscopy;  Laterality: N/A;  . FOOT SURGERY     right  . IR RADIOLOGY PERIPHERAL GUIDED IV START  09/28/2017  . IR [place] ACCESS RIGHT  09/28/2017  . LAPAROSCOPIC CHOLECYSTECTOMY SINGLE PORT N/A 06/01/2013   Procedure: LAPAROSCOPIC CHOLECYSTECTOMY SINGLE  PORT;  Surgeon: SAdin Hector MD;  [facility];  Service: General;  Laterality: N/A;  . MAZE N/A 11/16/2017   Procedure: MAZE;  Surgeon: ORexene Alberts MD;  [facility] [facility]  Service: Open Heart Surgery;  Laterality: N/A;  . MITRAL VALVE REPAIR N/A 11/16/2017   Procedure: MITRAL VALVE  REPLACEMENT with 216mMagnaEase;  Surgeon: OwRexene AlbertsMD;  [facility] Service: Open Heart Surgery;  Laterality: N/A;  . PACEMAKER IMPLANT N/A 11/21/2017   Procedure: PACEMAKER IMPLANT;  Surgeon: TaEvans LanceMD;  [facility];  Service: Cardiovascular;  Laterality: N/A;  . POLYPECTOMY    . RIGHT/LEFT HEART CATH AND CORONARY ANGIOGRAPHY N/A 08/30/2017   Procedure: RIGHT/LEFT HEART CATH AND CORONARY  ANGIOGRAPHY;  Surgeon: Larey Dresser, MD;  Location: [place] [facility];  Service: Cardiovascular;  Laterality: N/A;  . SHOULDER ARTHROSCOPY WITH ROTATOR CUFF REPAIR AND SUBACROMIAL DECOMPRESSION Left 05/18/2017  . SHOULDER ARTHROSCOPY WITH ROTATOR CUFF REPAIR AND SUBACROMIAL DECOMPRESSION Left 05/18/2017   Procedure: SHOULDER ARTHROSCOPY WITH ROTATOR CUFF REPAIR AND SUBACROMIAL DECOMPRESSION;  Surgeon: Tania Ade, MD;  Location: [place];  Service: Orthopedics;  Laterality: Left;  LEFT SHOULDER ARTHROSCOPY WITH ROTATOR CUFF REPAIR AND SUBACROMIAL DECOMPRESSION  . TEE WITHOUT CARDIOVERSION N/A 05/09/2017   Procedure: TRANSESOPHAGEAL ECHOCARDIOGRAM (TEE);  Surgeon: Skeet Latch, MD;  Location: [place];  Service: Cardiovascular;  Laterality: N/A;  . TEE WITHOUT CARDIOVERSION N/A 08/30/2017   Procedure: TRANSESOPHAGEAL ECHOCARDIOGRAM (TEE);  Surgeon: Larey Dresser, MD;  Location: [facility] ENDOSCOPY;  Service: Cardiovascular;  Laterality: N/A;  . TEE WITHOUT CARDIOVERSION N/A 11/16/2017   Procedure: TRANSESOPHAGEAL ECHOCARDIOGRAM (TEE);  Surgeon: Rexene Alberts, MD;  Location: [place];  Service: Open Heart Surgery;  Laterality: N/A;  . TEE WITHOUT CARDIOVERSION N/A 04/11/2018    Procedure: TRANSESOPHAGEAL ECHOCARDIOGRAM (TEE);  Surgeon: Larey Dresser, MD;  Location[facility];  Service: Cardiovascular;  Laterality: N/A;  . TONSILLECTOMY      Family History  Problem Relation Age of Onset  . Stroke Father        No details  . Angina Mother     Social History   Socioeconomic History  . Marital status: Married    Spouse name: Not on file  . Number of children: 3  . Years of education: Not on file  . Highest education level: Not on file  Occupational History    Employer: RETIRED  Tobacco Use  . Smoking status: Former Smoker    Packs/day: 0.50    Years: 57.00    Pack years: 28.50    Types: Cigarettes    Quit date: 07/13/1997    Years since quitting: 23.6  . Smokeless tobacco: Never Used  Vaping Use  . Vaping Use: Former  . Quit date: 07/13/2017  Substance and Sexual Activity  . Alcohol use: Yes    Alcohol/week: 2.0 - 3.0 standard drinks    Types: 2 - 3 Glasses of wine per week    Comment: moderate wine ; not since 8/ 2018  . Drug use: No  . Sexual activity: Yes  Other Topics Concern  . Not on file  Social History Narrative   Two living children.  Lives with wife.     Social Determinants of Health   Financial Resource Strain: Not on file  Food Insecurity: Not on file  Transportation Needs: Not on file  Physical Activity: Not on file  Stress: Not on file  Social Connections: Not on file  Intimate Partner Violence: Not on file    Review of systems: Review of Systems  Constitutional: Negative for fever and chills.  HENT: Negative.   Eyes: Negative for blurred vision.  Respiratory: as per HPI  Cardiovascular: Negative for chest pain and palpitations.  Gastrointestinal: Negative for vomiting, diarrhea, blood per rectum. Genitourinary: Negative for dysuria, urgency, frequency and hematuria.  Musculoskeletal: Negative for myalgias, back pain and joint pain.  Skin: Negative for itching and rash.  Neurological: Negative for dizziness,  tremors, focal weakness, seizures and loss of consciousness.  Endo/Heme/Allergies: Negative for environmental allergies.  Psychiatric/Behavioral: Negative for depression, suicidal ideas and hallucinations.  All other systems reviewed and are negative.  Physical Exam: Blood pressure 130/64, pulse 60, temperature 97.7 F (36.5 C), temperature source  Temporal, height '5\' 11"'$  (1.803 m), weight 287 lb (130.2 kg), SpO2 93 %. Gen:      No acute distress HEENT:  EOMI, sclera anicteric Neck:     No masses; no thyromegaly Lungs:    Clear to auscultation bilaterally; normal respiratory effort*** CV:         Regular rate and rhythm; no murmurs Abd:      + bowel sounds; soft, non-tender; no palpable masses, no distension Ext:    No edema; adequate peripheral perfusion Skin:      Warm and dry; no rash Neuro: alert and oriented x 3 Psych: normal mood and affect  Data Reviewed: Imaging:  PFTs:  Labs:  Assessment:  ***  Plan/Recommendations: *** This appointment required *** minutes of patient care (this includes precharting, chart review, review of results, face-to-face care, etc.).  Marshell Garfinkel MD [facility] 02/24/2021, 11:55 AM  CC: Larey Dresser, MD

## 2021-02-24 NOTE — Progress Notes (Signed)
MONTREY EKLEBERRY    CS:[id number]    [birth year]  Primary Care Physician:Webb, Arbie Cookey, MD  Referring Physician: Larey Dresser, MD [phone] [place],  [place] [address]  Chief complaint:   Consult for ILD  HPI: 75 year old ex-smoker with significant cardiac history of  CVA, paroxysmal atrial fibrillation, valvular heart disease, and chronic diastolic CHF.  He had a prolonged hospitalization in 2018 for heart failure, A. fib with RVR.  Eventually underwent aortic and mitral valve replacement, Maze procedure, LAA clipping  He was started on amiodarone around 2018 2019 and continues on at 800 mg a day.  Follows with Dr. Aundra Dubin in the heart failure clinic  Over the past few months he has noted increasing dyspnea on exertion.  He had a work-up done including PFTs and CT scan which showed changes suggestive of interstitial lung disease and has been referred here for further evaluation.  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  No ongoing exposures.  No mold, hot tub, Jacuzzi.  No feather pillows or comforter Smoking history: 30-pack-year smoker.  Quit in 2018 Travel history: No significant travel history Relevant family history: No family history of lung disease  Outpatient Encounter Medications as of 02/24/2021  Medication Sig  . acetaminophen (TYLENOL) 325 MG tablet Take 1 tablet (325 mg total) by mouth every 6 (six) hours as needed for mild pain.  [name] amiodarone (PACERONE) 100 MG tablet Take 1 tablet (100 mg total) by mouth daily. Please make yearly appt with Dr. Lovena Le for December before anymore refills. 1st attempt  . amLODipine (NORVASC) 5 MG tablet Take 1 tablet (5 mg total) by mouth daily.  . calcium carbonate (TUMS - DOSED IN MG ELEMENTAL CALCIUM) 500 MG chewable tablet Chew 1 tablet by  mouth daily as needed for indigestion or heartburn.  . carvedilol (COREG) 12.5 MG tablet TAKE 1 TABLET TWICE DAILY WITH MEALS  . cetirizine (ZYRTEC) 10 MG tablet Take 10 mg by mouth daily as needed for allergies.  . citalopram (CELEXA) 20 MG tablet Take 1 tablet (20 mg total) by mouth at bedtime.  . docusate sodium (COLACE) 100 MG capsule Take 1 capsule (100 mg total) by mouth 3 (three) times daily as needed.  . empagliflozin (JARDIANCE) 10 MG TABS tablet Take 1 tablet (10 mg total) by mouth daily before breakfast.  . EPINEPHrine (EPIPEN JR) 0.15 MG/0.3ML injection   . furosemide (LASIX) 40 MG tablet Take 40 mg by mouth 2 (two) times daily.  [name] guaiFENesin (MUCINEX) 600 MG 12 hr tablet Take 600 mg by mouth 2 (two) times daily.  . methocarbamol (ROBAXIN) 500 MG tablet Take 1 tablet (500 mg total) by mouth every 8 (eight) hours as needed for muscle spasms.  . pantoprazole (PROTONIX) 40 MG tablet Take 1 tablet (40 mg total) by mouth daily.  . Pseudoeph-Doxylamine-DM-APAP (NYQUIL PO) Take by mouth as needed.  . rosuvastatin (CRESTOR) 10 MG tablet TAKE 1 TABLET EVERY DAY  . sacubitril-valsartan (ENTRESTO) 49-51 MG Take 1 tablet by mouth 2 (two) times daily.  [name] spironolactone (ALDACTONE) 25 MG tablet TAKE 1 TABLET EVERY DAY  . traZODone (DESYREL) 50 MG tablet Take 100 mg by mouth at bedtime.  [name] warfarin (COUMADIN) 5 MG tablet TAKE 1/2 TO 1 TABLET EVERY DAY AS DIRECTED BY THE  COUMADIN CLINIC   No facility-administered encounter medications on file as of 02/24/2021.    Allergies as of 02/24/2021 - Review Complete 02/24/2021  Allergen Reaction Noted  . Bee venom Anaphylaxis and Swelling 11/14/2011    Past Medical History:  Diagnosis Date  . Anemia 07/13/2017  . Anxiety   . Aortic insufficiency   . Aortic stenosis, moderate 07/13/2017  . Arthritis    back   . Carotid stenosis    Right carotid stent (widely patent) 40 - 59% left plaque 11/13  . CHF (congestive heart failure) ([place])   . Chronic  kidney disease   . COPD (chronic obstructive pulmonary disease) ([place])   . Coronary artery disease involving native coronary artery of native heart without angina pectoris   . Depression   . Dyslipidemia   . GERD (gastroesophageal reflux disease)   . Heart murmur   . Hemiplegia affecting unspecified side, late effect of cerebrovascular disease    resolved- from L side   . Hypertension   . Jaundice    resolved following ERCP & Cholecystectomy  . Mild emphysema ([place])   . Mitral regurgitation   . Mitral valve insufficiency and aortic valve insufficiency   . Myocardial infarction ([place])   . Paroxysmal atrial fibrillation ([facility])   . Pre-diabetes    per spouse  . S/P aortic valve replacement with bioprosthetic valve 11/16/2017   23 mm [facility] Ease stented bovine pericardial tissue valve  . S/P CABG x 1 11/16/2017   SVG to PDA with EVH via right thigh  . S/P Maze operation for atrial fibrillation 11/16/2017   Left side lesion set using bipolar radiofrequency and cryothermy ablation with clipping of LA appendage  . S/P mitral valve replacement with bioprosthetic valve 11/16/2017   27 mm [facility] Mitral stented bovine pericardial tissue valve  . Sleep apnea    does not wear CPAP  . Sleep concern    resulted in surgery- after + sleep test. Pt. doesn't have a problem any longer.   . Stroke ([place]) 03/11/2003   stent placed on the 31, 3, 2004, L side   . Wears glasses   . Wears hearing aid in both ears   . Wears partial dentures     Past Surgical History:  Procedure Laterality Date  . AORTIC VALVE REPLACEMENT N/A 11/16/2017   Procedure: AORTIC VALVE REPLACEMENT (AVR) with 66m Magna Ease;  Surgeon: ORexene Alberts MD;  [place]  Service: Open Heart Surgery;  Laterality: N/A;  . ARTERIAL LINE INSERTION Right 11/16/2017   Procedure: ARTERIAL LINE INSERTION -RIGHT FEMORAL;  Surgeon: ORexene Alberts MD;  [place]  Service: Open Heart Surgery;  Laterality: Right;  .  BACK SURGERY     lumbar back  . CARDIOVERSION N/A 04/11/2018   Procedure: CARDIOVERSION;  Surgeon: MLarey Dresser MD;  [facility]  Service: Cardiovascular;  Laterality: N/A;  . CHOLECYSTECTOMY    . CORONARY ARTERY BYPASS GRAFT N/A 11/16/2017   Procedure: CORONARY ARTERY BYPASS GRAFTING (CABG) x 1;  Surgeon: ORexene Alberts MD;  [place]  Service: Open Heart Surgery;  Laterality: N/A;  . ENDOVEIN HARVEST OF GREATER SAPHENOUS VEIN Right 11/16/2017   Procedure: ENDOVEIN HARVEST OF GREATER SAPHENOUS VEIN;  Surgeon: ORexene Alberts MD;  [place]  Service: Open Heart Surgery;  Laterality: Right;  . ERCP N/A 05/31/2013   Procedure: ENDOSCOPIC RETROGRADE CHOLANGIOPANCREATOGRAPHY (ERCP);  Surgeon: MLadene Artist MD;  [facility];  Service:  Endoscopy;  Laterality: N/A;  . FOOT SURGERY     right  . IR RADIOLOGY PERIPHERAL GUIDED IV START  09/28/2017  . IR US GUIDE VASC ACCESS RIGHT  09/28/2017  . LAPAROSCOPIC CHOLECYSTECTOMY SINGLE PORT N/A 06/01/2013   Procedure: LAPAROSCOPIC CHOLECYSTECTOMY SINGLE PORT;  Surgeon: Adin Hector, MD;  Location: [facility];  Service: General;  Laterality: N/A;  . MAZE N/A 11/16/2017   Procedure: MAZE;  Surgeon: Rexene Alberts, MD;  Location: [place];  Service: Open Heart Surgery;  Laterality: N/A;  . MITRAL VALVE REPAIR N/A 11/16/2017   Procedure: MITRAL VALVE  REPLACEMENT with 66m MagnaEase;  Surgeon: ORexene Alberts MD;  [place]: [place]  Service: Open Heart Surgery;  Laterality: N/A;  . PACEMAKER IMPLANT N/A 11/21/2017   Procedure: PACEMAKER IMPLANT;  Surgeon: TEvans Lance MD;  [facility];  Service: Cardiovascular;  Laterality: N/A;  . POLYPECTOMY    . RIGHT/LEFT HEART CATH AND CORONARY ANGIOGRAPHY N/A 08/30/2017   Procedure: RIGHT/LEFT HEART CATH AND CORONARY ANGIOGRAPHY;  Surgeon: MLarey Dresser MD;  [facility];  Service: Cardiovascular;  Laterality: N/A;  . SHOULDER ARTHROSCOPY  WITH ROTATOR CUFF REPAIR AND SUBACROMIAL DECOMPRESSION Left 05/18/2017  . SHOULDER ARTHROSCOPY WITH ROTATOR CUFF REPAIR AND SUBACROMIAL DECOMPRESSION Left 05/18/2017   Procedure: SHOULDER ARTHROSCOPY WITH ROTATOR CUFF REPAIR AND SUBACROMIAL DECOMPRESSION;  Surgeon: CTania Ade MD;  [place]  Service: Orthopedics;  Laterality: Left;  LEFT SHOULDER ARTHROSCOPY WITH ROTATOR CUFF REPAIR AND SUBACROMIAL DECOMPRESSION  . TEE WITHOUT CARDIOVERSION N/A 05/09/2017   Procedure: TRANSESOPHAGEAL ECHOCARDIOGRAM (TEE);  Surgeon: RSkeet Latch MD;  [place]  Service: Cardiovascular;  Laterality: N/A;  . TEE WITHOUT CARDIOVERSION N/A 08/30/2017   Procedure: TRANSESOPHAGEAL ECHOCARDIOGRAM (TEE);  Surgeon: MLarey Dresser MD;  [facility]  Service: Cardiovascular;  Laterality: N/A;  . TEE WITHOUT CARDIOVERSION N/A 11/16/2017   Procedure: TRANSESOPHAGEAL ECHOCARDIOGRAM (TEE);  Surgeon: ORexene Alberts MD;  [place]  Service: Open Heart Surgery;  Laterality: N/A;  . TEE WITHOUT CARDIOVERSION N/A 04/11/2018   Procedure: TRANSESOPHAGEAL ECHOCARDIOGRAM (TEE);  Surgeon: MLarey Dresser MD;  [facility];  Service: Cardiovascular;  Laterality: N/A;  . TONSILLECTOMY      Family History  Problem Relation Age of Onset  . Stroke Father        No details  . Angina Mother     Social History   Socioeconomic History  . Marital status: Married    Spouse name: Not on file  . Number of children: 3  . Years of education: Not on file  . Highest education level: Not on file  Occupational History    Employer: RETIRED  Tobacco Use  . Smoking status: Former Smoker    Packs/day: 0.50    Years: 57.00    Pack years: 28.50    Types: Cigarettes    Quit date: 07/13/1997    Years since quitting: 23.6  . Smokeless tobacco: Never Used  Vaping Use  . Vaping Use: Former  . Quit date: 07/13/2017  Substance and Sexual Activity  . Alcohol use: Yes    Alcohol/week: 2.0  - 3.0 standard drinks    Types: 2 - 3 Glasses of wine per week    Comment: moderate wine ; not since 8/ 2018  . Drug use: No  . Sexual activity: Yes  Other Topics Concern  . Not on file  Social History Narrative   Two living children.  Lives  with wife.     Social Determinants of Health   Financial Resource Strain: Not on file  Food Insecurity: Not on file  Transportation Needs: Not on file  Physical Activity: Not on file  Stress: Not on file  Social Connections: Not on file  Intimate Partner Violence: Not on file    Review of systems: Review of Systems  Constitutional: Negative for fever and chills.  HENT: Negative.   Eyes: Negative for blurred vision.  Respiratory: as per HPI  Cardiovascular: Negative for chest pain and palpitations.  Gastrointestinal: Negative for vomiting, diarrhea, blood per rectum. Genitourinary: Negative for dysuria, urgency, frequency and hematuria.  Musculoskeletal: Negative for myalgias, back pain and joint pain.  Skin: Negative for itching and rash.  Neurological: Negative for dizziness, tremors, focal weakness, seizures and loss of consciousness.  Endo/Heme/Allergies: Negative for environmental allergies.  Psychiatric/Behavioral: Negative for depression, suicidal ideas and hallucinations.  All other systems reviewed and are negative.  Physical Exam: Blood pressure 130/64, pulse 60, temperature 97.7 F (36.5 C), temperature source Temporal, height '5\' 11"'$  (1.803 m), weight 287 lb (130.2 kg), SpO2 93 %. Gen:      No acute distress, obese HEENT:  EOMI, sclera anicteric Neck:     No masses; no thyromegaly Lungs:    Clear to auscultation bilaterally; normal respiratory effort CV:         Regular rate and rhythm; no murmurs Abd:      + bowel sounds; soft, non-tender; no palpable masses, no distension Ext:    No edema; adequate peripheral perfusion Skin:      Warm and dry; no rash Neuro: alert and oriented x 3 Psych: normal mood and affect  Data  Reviewed: Imaging: CT coronary 09/28/17-no interstitial lung disease, mild bibasal atelectasis  High-res CT 01/20/2021-bilateral groundglass attenuation most evident in the mid to lower lungs with mild reticulation, moderate air trapping.  I have reviewed the images personally.  PFTs: 01/07/2021 FVC 2.70 (60%), FEV1 2.19 (16%), F/F 81, TLC 6.03 [82%], DLCO 13.20 [50%] Moderate diffusion defect  Labs: Sed rate 01/29/21-46  Assessment:  Evaluation for interstitial lung disease I have reviewed his CT scan which shows groundglass opacities with minimal reticulation.  The appearance of this is nonspecific and there is no clear evidence of interstitial lung disease or pulmonary fibrosis  This could be from amiodarone use the sed rate noted to be low Will discuss with cardiology if he can trial him off amiodarone though he may need it for his persistent atrial fibrillation.  I suspect he has some airway disease given his smoking history and atropine even though there is no obstruction on PFTs  I will give him a trial of Trelegy inhaler Check CTD serologies to rule out any autoimmune process, check hypersensitivity panel and ILD questionnaire  Plan/Recommendations: Trelegy inhaler Check labs for autoimmune process, hepatology panel ILD questionnare  Marshell Garfinkel MD [facility] 02/24/2021, 12:08 PM  CC: Larey Dresser, MD

## 2021-02-25 ENCOUNTER — Ambulatory Visit (INDEPENDENT_AMBULATORY_CARE_PROVIDER_SITE_OTHER): Payer: Medicare HMO | Admitting: *Deleted

## 2021-02-25 ENCOUNTER — Telehealth (HOSPITAL_COMMUNITY): Payer: Self-pay | Admitting: *Deleted

## 2021-02-25 DIAGNOSIS — I480 Paroxysmal atrial fibrillation: Secondary | ICD-10-CM

## 2021-02-25 DIAGNOSIS — Z5181 Encounter for therapeutic drug level monitoring: Secondary | ICD-10-CM

## 2021-02-25 DIAGNOSIS — Z953 Presence of xenogenic heart valve: Secondary | ICD-10-CM

## 2021-02-25 DIAGNOSIS — Z951 Presence of aortocoronary bypass graft: Secondary | ICD-10-CM

## 2021-02-25 DIAGNOSIS — Z8679 Personal history of other diseases of the circulatory system: Secondary | ICD-10-CM

## 2021-02-25 DIAGNOSIS — Z9889 Other specified postprocedural states: Secondary | ICD-10-CM

## 2021-02-25 LAB — POCT INR: INR: 3.1 — AB (ref 2.0–3.0)

## 2021-02-25 NOTE — Patient Instructions (Signed)
Description   Tomorrow take 1/2 tablet then start taking 1/2 tablet daily except for 1 tablet on Mondays, and Fridays. Recheck INR in 2 weeks (normally 6 weeks). Amio stopped on 02/25/21. Call with any new medications, scheduled procedures or any questions  [phone]

## 2021-02-25 NOTE — Telephone Encounter (Signed)
-----   Message from Larey Dresser, MD sent at 02/24/2021 11:17 PM EDT -----   ----- Message ----- From: Marshell Garfinkel, MD Sent: 02/24/2021  12:25 PM EDT To: Larey Dresser, MD  Thanks for the referral His CT findings are very nonspecific and not sure if he actually has pulmonary fibrosis I suppose this could be from the amiodarone.  Do think we can trial him off and give him some steroids?  I am giving a trial of inhalers and checking some labs

## 2021-02-25 NOTE — Telephone Encounter (Signed)
Per Dr Aundra Dubin:  Editor: Larey Dresser, MD (Physician)       Show:Clear all '[x]'$ Manual'[]'$ Template'[]'$ Copied  Added by: '[x]'$ Larey Dresser, MD   '[]'$ Hover for details  Brandon [name], thanks for seeing him.  I think it would be ok to try him off amiodarone, could give him a course of steroids if you think it would be helpful.   Mysti Haley/Philicia, could you talk to [name] and have him stop amiodarone for the time being? Thanks.      Spoke w/pt's wife, she is agreeable and verbalized understanding. Med list updated

## 2021-02-27 LAB — SJOGREN'S SYNDROME ANTIBODS(SSA + SSB)
SSA (Ro) (ENA) Antibody, IgG: <1 AI
SSB (La) (ENA) Antibody, IgG: <1 AI

## 2021-02-27 LAB — ANA,IFA RA DIAG PNL W/RFLX TIT/PATN
Anti Nuclear Antibody (ANA): NEGATIVE
Cyclic Citrullin Peptide Ab: >250 UNITS — ABNORMAL HIGH
Rheumatoid fact SerPl-aCnc: <14 IU/mL (ref <14)

## 2021-02-27 LAB — ANTI-SCLERODERMA ANTIBODY: Scleroderma (Scl-70) (ENA) Antibody, IgG: <1 AI

## 2021-02-27 LAB — IGE: IgE (Immunoglobulin E), Serum: 79 kU/L (ref <=114)

## 2021-02-27 LAB — ANCA SCREEN W REFLEX TITER: ANCA Screen: NEGATIVE

## 2021-03-01 ENCOUNTER — Telehealth: Payer: Self-pay | Admitting: Pulmonary Disease

## 2021-03-01 LAB — HYPERSENSITIVITY PNEUMONITIS
A. Pullulans Abs: NEGATIVE
A.Fumigatus #1 Abs: NEGATIVE
Micropolyspora faeni, IgG: NEGATIVE
Pigeon Serum Abs: NEGATIVE
Thermoact. Saccharii: NEGATIVE
Thermoactinomyces vulgaris, IgG: NEGATIVE

## 2021-03-01 MED ORDER — TRELEGY ELLIPTA 200-62.5-25 MCG/INH IN AEPB: 1.0000 | INHALATION_SPRAY | Freq: Every day | RESPIRATORY_TRACT | 1 refills | Status: DC

## 2021-03-01 NOTE — Telephone Encounter (Signed)
OK. I sent the order in

## 2021-03-01 NOTE — Telephone Encounter (Signed)
Called and spoke to Severn to let her know that RX for Trelegy was sent in to [facility] mail order. She expressed understanding. Nothing further needed at this time.

## 2021-03-01 NOTE — Telephone Encounter (Signed)
Patient called states the Trelegy is working well and would like to continue it.   Dr. Vaughan Browner may I send a prescription to his pharmacy?

## 2021-03-02 ENCOUNTER — Telehealth: Payer: Self-pay | Admitting: Pulmonary Disease

## 2021-03-02 NOTE — Telephone Encounter (Signed)
I have called and LM on VM for the pt or his wife to call back about the trelegy.

## 2021-03-02 NOTE — Telephone Encounter (Signed)
Katharine Look wife is returning phone call. Katharine Look phone number is [phone].

## 2021-03-02 NOTE — Telephone Encounter (Signed)
ATC Patient. LM to call back 03/03/21 after 0800. Phones are currently off, because its after 5pm.

## 2021-03-03 ENCOUNTER — Other Ambulatory Visit: Payer: Self-pay

## 2021-03-03 ENCOUNTER — Ambulatory Visit (HOSPITAL_COMMUNITY)
Admission: RE | Admit: 2021-03-03 | Discharge: 2021-03-03 | Disposition: A | Discharge status: Home / Self Care | Payer: Medicare HMO | Source: Ambulatory Visit | Attending: Internal Medicine | Admitting: Internal Medicine

## 2021-03-03 DIAGNOSIS — I5022 Chronic systolic (congestive) heart failure: Secondary | ICD-10-CM

## 2021-03-03 LAB — BASIC METABOLIC PANEL
Anion gap: 11 (ref 5–15)
BUN: 27 mg/dL — ABNORMAL HIGH (ref 8–23)
CO2: 23 mmol/L (ref 22–32)
Calcium: 8.7 mg/dL — ABNORMAL LOW (ref 8.9–10.3)
Chloride: 105 mmol/L (ref 98–111)
Creatinine, Ser: 2.13 mg/dL — ABNORMAL HIGH (ref 0.61–1.24)
GFR, Estimated: 32 mL/min — ABNORMAL LOW (ref >60)
Glucose, Bld: 129 mg/dL — ABNORMAL HIGH (ref 70–99)
Potassium: 3.4 mmol/L — ABNORMAL LOW (ref 3.5–5.1)
Sodium: 139 mmol/L (ref 135–145)

## 2021-03-04 ENCOUNTER — Ambulatory Visit (INDEPENDENT_AMBULATORY_CARE_PROVIDER_SITE_OTHER): Payer: Medicare HMO

## 2021-03-04 DIAGNOSIS — I442 Atrioventricular block, complete: Secondary | ICD-10-CM

## 2021-03-05 ENCOUNTER — Other Ambulatory Visit (HOSPITAL_BASED_OUTPATIENT_CLINIC_OR_DEPARTMENT_OTHER): Payer: Self-pay

## 2021-03-06 LAB — CUP PACEART REMOTE DEVICE CHECK
Battery Remaining Longevity: 123 mo
Battery Voltage: 3.01 V
Brady Statistic AP VP Percent: 0.41 %
Brady Statistic AP VS Percent: 75.41 %
Brady Statistic AS VP Percent: 0.04 %
Brady Statistic AS VS Percent: 24.15 %
Brady Statistic RA Percent Paced: 75.95 %
Brady Statistic RV Percent Paced: 0.45 %
Date Time Interrogation Session: 20220323215939
Implantable Lead Implant Date: 20181211
Implantable Lead Implant Date: 20181211
Implantable Lead Location: 753859
Implantable Lead Location: 753860
Implantable Lead Model: 3830
Implantable Lead Model: 5076
Implantable Pulse Generator Implant Date: 20181211
Lead Channel Impedance Value: 266 Ohm
Lead Channel Impedance Value: 323 Ohm
Lead Channel Impedance Value: 361 Ohm
Lead Channel Impedance Value: 380 Ohm
Lead Channel Pacing Threshold Amplitude: 0.625 V
Lead Channel Pacing Threshold Amplitude: 1.625 V
Lead Channel Pacing Threshold Pulse Width: 0.4 ms
Lead Channel Pacing Threshold Pulse Width: 0.4 ms
Lead Channel Sensing Intrinsic Amplitude: 3 mV
Lead Channel Sensing Intrinsic Amplitude: 3 mV
Lead Channel Sensing Intrinsic Amplitude: 5 mV
Lead Channel Sensing Intrinsic Amplitude: 5 mV
Lead Channel Setting Pacing Amplitude: 2 V
Lead Channel Setting Pacing Amplitude: 2 V
Lead Channel Setting Pacing Pulse Width: 0.4 ms
Lead Channel Setting Sensing Sensitivity: 1.2 mV

## 2021-03-08 ENCOUNTER — Telehealth (HOSPITAL_COMMUNITY): Payer: Self-pay | Admitting: *Deleted

## 2021-03-08 MED ORDER — FUROSEMIDE 40 MG PO TABS: ORAL_TABLET | ORAL | 0 refills | Status: DC

## 2021-03-08 NOTE — Telephone Encounter (Signed)
Brandon [name], [place]  03/08/2021 1:00 PM EDT Back to Top     Spoke with pts wife she is aware and agreeable with plan.    Brandon [name], [place]  03/05/2021 4:37 PM EDT      2nd attempt to reach pt by phone. Left vm requesting return call.    Brandon [name], [facility]  03/05/2021 9:16 AM EDT      Left VM for pt to return my call for lab results.    [name], PA-C  03/03/2021 4:48 PM EDT      Labs show AKI and mildly low K. Hold Lasix x 1 day then reduce down to 40 mg qam/ 20 mg qpm. Repeat BMP in 1 week

## 2021-03-09 NOTE — Telephone Encounter (Signed)
Called and left detailed message for pt to contact insurance to see what are covered alternatives there are to the Trelegy and to then contact us back.

## 2021-03-10 ENCOUNTER — Encounter: Payer: Self-pay | Admitting: *Deleted

## 2021-03-10 NOTE — Telephone Encounter (Signed)
Letter has been mailed to the pt asking him to call insurance for alternatives to Trelegy and then call the office back.

## 2021-03-11 ENCOUNTER — Other Ambulatory Visit: Payer: Self-pay | Admitting: *Deleted

## 2021-03-11 ENCOUNTER — Ambulatory Visit (INDEPENDENT_AMBULATORY_CARE_PROVIDER_SITE_OTHER): Payer: Medicare HMO | Admitting: *Deleted

## 2021-03-11 ENCOUNTER — Other Ambulatory Visit (HOSPITAL_COMMUNITY): Payer: Medicare HMO

## 2021-03-11 ENCOUNTER — Other Ambulatory Visit: Payer: Self-pay

## 2021-03-11 ENCOUNTER — Telehealth: Payer: Self-pay | Admitting: Pulmonary Disease

## 2021-03-11 DIAGNOSIS — Z5181 Encounter for therapeutic drug level monitoring: Secondary | ICD-10-CM | POA: Diagnosis not present

## 2021-03-11 DIAGNOSIS — I480 Paroxysmal atrial fibrillation: Secondary | ICD-10-CM | POA: Diagnosis not present

## 2021-03-11 DIAGNOSIS — Z8679 Personal history of other diseases of the circulatory system: Secondary | ICD-10-CM

## 2021-03-11 DIAGNOSIS — Z951 Presence of aortocoronary bypass graft: Secondary | ICD-10-CM

## 2021-03-11 DIAGNOSIS — Z9889 Other specified postprocedural states: Secondary | ICD-10-CM | POA: Diagnosis not present

## 2021-03-11 DIAGNOSIS — Z953 Presence of xenogenic heart valve: Secondary | ICD-10-CM | POA: Diagnosis not present

## 2021-03-11 DIAGNOSIS — R7989 Other specified abnormal findings of blood chemistry: Secondary | ICD-10-CM

## 2021-03-11 LAB — POCT INR: INR: 3 (ref 2.0–3.0)

## 2021-03-11 NOTE — Telephone Encounter (Signed)
Spoke with Patient's Wife, Katharine Look.  Katharine Look stated Patient had finished with Trelegy and was having increased wheezing after using.  Katharine Look stated Patient could not afford to continue Trelegy and it was not helping him. Katharine Look asked if there was another inhaler Patient could try in place of the Trelegy?  Message routed to Dr. Vaughan Browner to advise

## 2021-03-11 NOTE — Telephone Encounter (Signed)
I have called and LM on VM for pt or his wife to call back.

## 2021-03-11 NOTE — Patient Instructions (Signed)
Description   Continue taking 1/2 tablet daily except for 1 tablet on Mondays, and Fridays. Add another leafy veggie to your diet. Recheck INR in 4.5 weeks (normally 6 weeks) as pt will be out of town. Amio stopped on 02/25/21. Call with any new medications, scheduled procedures or any questions  [phone]

## 2021-03-11 NOTE — Telephone Encounter (Addendum)
Can he call the insurance or pharmacy to see what are covered Try Symbicort 160/4.5 puffs twice daily

## 2021-03-12 LAB — MYOMARKER 3 PLUS PROFILE (RDL)

## 2021-03-15 NOTE — Progress Notes (Signed)
Remote pacemaker transmission.   

## 2021-03-17 DIAGNOSIS — N183 Chronic kidney disease, stage 3 unspecified: Secondary | ICD-10-CM | POA: Diagnosis not present

## 2021-03-17 DIAGNOSIS — I480 Paroxysmal atrial fibrillation: Secondary | ICD-10-CM | POA: Diagnosis not present

## 2021-03-17 DIAGNOSIS — I129 Hypertensive chronic kidney disease with stage 1 through stage 4 chronic kidney disease, or unspecified chronic kidney disease: Secondary | ICD-10-CM | POA: Diagnosis not present

## 2021-03-22 ENCOUNTER — Other Ambulatory Visit: Payer: Self-pay

## 2021-03-22 ENCOUNTER — Ambulatory Visit (HOSPITAL_COMMUNITY)
Admission: RE | Admit: 2021-03-22 | Discharge: 2021-03-22 | Disposition: A | Discharge status: Home / Self Care | Payer: Medicare HMO | Source: Ambulatory Visit | Attending: Cardiology | Admitting: Cardiology

## 2021-03-22 ENCOUNTER — Encounter (HOSPITAL_COMMUNITY): Payer: Self-pay

## 2021-03-22 VITALS — BP 122/62 | HR 60 | Wt 283.2 lb

## 2021-03-22 DIAGNOSIS — I480 Paroxysmal atrial fibrillation: Secondary | ICD-10-CM | POA: Insufficient documentation

## 2021-03-22 DIAGNOSIS — D509 Iron deficiency anemia, unspecified: Secondary | ICD-10-CM | POA: Insufficient documentation

## 2021-03-22 DIAGNOSIS — I5042 Chronic combined systolic (congestive) and diastolic (congestive) heart failure: Secondary | ICD-10-CM | POA: Insufficient documentation

## 2021-03-22 DIAGNOSIS — I352 Nonrheumatic aortic (valve) stenosis with insufficiency: Secondary | ICD-10-CM | POA: Diagnosis not present

## 2021-03-22 DIAGNOSIS — Z7901 Long term (current) use of anticoagulants: Secondary | ICD-10-CM | POA: Diagnosis not present

## 2021-03-22 DIAGNOSIS — N183 Chronic kidney disease, stage 3 unspecified: Secondary | ICD-10-CM | POA: Insufficient documentation

## 2021-03-22 DIAGNOSIS — E785 Hyperlipidemia, unspecified: Secondary | ICD-10-CM | POA: Diagnosis not present

## 2021-03-22 DIAGNOSIS — E669 Obesity, unspecified: Secondary | ICD-10-CM | POA: Insufficient documentation

## 2021-03-22 DIAGNOSIS — I130 Hypertensive heart and chronic kidney disease with heart failure and stage 1 through stage 4 chronic kidney disease, or unspecified chronic kidney disease: Secondary | ICD-10-CM | POA: Diagnosis not present

## 2021-03-22 DIAGNOSIS — J449 Chronic obstructive pulmonary disease, unspecified: Secondary | ICD-10-CM | POA: Insufficient documentation

## 2021-03-22 DIAGNOSIS — K219 Gastro-esophageal reflux disease without esophagitis: Secondary | ICD-10-CM | POA: Insufficient documentation

## 2021-03-22 DIAGNOSIS — Z8673 Personal history of transient ischemic attack (TIA), and cerebral infarction without residual deficits: Secondary | ICD-10-CM | POA: Insufficient documentation

## 2021-03-22 DIAGNOSIS — Z87891 Personal history of nicotine dependence: Secondary | ICD-10-CM | POA: Insufficient documentation

## 2021-03-22 DIAGNOSIS — I2510 Atherosclerotic heart disease of native coronary artery without angina pectoris: Secondary | ICD-10-CM | POA: Insufficient documentation

## 2021-03-22 DIAGNOSIS — J849 Interstitial pulmonary disease, unspecified: Secondary | ICD-10-CM | POA: Diagnosis not present

## 2021-03-22 DIAGNOSIS — I5022 Chronic systolic (congestive) heart failure: Secondary | ICD-10-CM | POA: Diagnosis not present

## 2021-03-22 DIAGNOSIS — G4733 Obstructive sleep apnea (adult) (pediatric): Secondary | ICD-10-CM | POA: Insufficient documentation

## 2021-03-22 DIAGNOSIS — I4892 Unspecified atrial flutter: Secondary | ICD-10-CM | POA: Diagnosis not present

## 2021-03-22 DIAGNOSIS — Z7984 Long term (current) use of oral hypoglycemic drugs: Secondary | ICD-10-CM | POA: Insufficient documentation

## 2021-03-22 DIAGNOSIS — Z953 Presence of xenogenic heart valve: Secondary | ICD-10-CM | POA: Diagnosis not present

## 2021-03-22 DIAGNOSIS — Z79899 Other long term (current) drug therapy: Secondary | ICD-10-CM | POA: Insufficient documentation

## 2021-03-22 DIAGNOSIS — Z6839 Body mass index (BMI) 39.0-39.9, adult: Secondary | ICD-10-CM | POA: Insufficient documentation

## 2021-03-22 LAB — BRAIN NATRIURETIC PEPTIDE: B Natriuretic Peptide: 90.4 pg/mL (ref 0.0–100.0)

## 2021-03-22 LAB — BASIC METABOLIC PANEL
Anion gap: 11 (ref 5–15)
BUN: 35 mg/dL — ABNORMAL HIGH (ref 8–23)
CO2: 22 mmol/L (ref 22–32)
Calcium: 8.8 mg/dL — ABNORMAL LOW (ref 8.9–10.3)
Chloride: 104 mmol/L (ref 98–111)
Creatinine, Ser: 2.12 mg/dL — ABNORMAL HIGH (ref 0.61–1.24)
GFR, Estimated: 32 mL/min — ABNORMAL LOW (ref >60)
Glucose, Bld: 106 mg/dL — ABNORMAL HIGH (ref 70–99)
Potassium: 4.9 mmol/L (ref 3.5–5.1)
Sodium: 137 mmol/L (ref 135–145)

## 2021-03-22 NOTE — Progress Notes (Signed)
ReDS Vest / Clip - 03/22/21 1300      ReDS Vest / Clip   Station Marker D    Ruler Value 40    ReDS Value Range Low volume    ReDS Actual Value 27    Anatomical Comments Sitting

## 2021-03-22 NOTE — Addendum Note (Signed)
Encounter addended by: Consuelo Pandy, PA-C on: 03/22/2021 4:47 PM  Actions taken: Medication List reviewed, Problem List reviewed, Allergies reviewed, Level of Service modified, Flowsheet accepted

## 2021-03-22 NOTE — Patient Instructions (Signed)
It was great to see you today! No medication changes are needed at this time.  Labs today We will only contact you if something comes back abnormal or we need to make some changes. Otherwise no news is good news!  Your physician recommends that you schedule a follow-up appointment in: 4 weeks with the pharmacy team and 3 months with Dr Aundra Dubin  Do the following things EVERYDAY: 1) Weigh yourself in the morning before breakfast. Write it down and keep it in a log. 2) Take your medicines as prescribed 3) Eat low salt foods--Limit salt (sodium) to 2000 mg per day.  4) Stay as active as you can everyday 5) Limit all fluids for the day to less than 2 liters  At the [facility], you and your health needs are our priority. As part of our continuing mission to provide you with exceptional heart care, we have created designated Provider Care Teams. These Care Teams include your primary Cardiologist (physician) and Advanced Practice Providers (APPs- Physician Assistants and Nurse Practitioners) who all work together to provide you with the care you need, when you need it.   You may see any of the following providers on your designated Care Team at your next follow up: [name] Dr Glori Bickers . Dr Loralie Champagne . Dr Vickki Muff . Darrick Grinder, NP . Lyda Jester, [place] . Audry Riles, PharmD   Please be sure to bring in all your medications bottles to every appointment.   If you have any questions or concerns before your next appointment please send [place] a message through [place] or call our office at [phone].    TO LEAVE A MESSAGE FOR THE NURSE SELECT OPTION 2, PLEASE LEAVE A MESSAGE INCLUDING: . YOUR NAME . DATE OF BIRTH . CALL BACK NUMBER . REASON FOR CALL**this is important as we prioritize the call backs  YOU WILL RECEIVE A CALL BACK THE SAME DAY AS LONG AS YOU CALL BEFORE 4:00 PM  Please see our updated No Show and Same Day Appointment  Cancellation Policy attached to your AVS.

## 2021-03-22 NOTE — Progress Notes (Signed)
Advanced Heart Failure Clinic Note   PCP: Dr. Justin Mend Cardiology: Dr. Radford Pax HF Cardiology: Dr. Aundra Dubin  75 y.o.with history of CVA, paroxysmal atrial fibrillation, valvular heart disease, and chronic diastolic CHF presents for evaluation of CHF and valvular heart disease.    He had had episodes of dyspnea and initial workup led to a TEE in 5/18 showing normal EF with moderate AS and moderate MR.  He continued to have episodic dyspnea and ended up admitted in 8/18 with shortness of breath and chest pressure.  This led to a long, complicated hospitalization.  He was noted to be volume overloaded with acute diastolic CHF and was also noted to have symptomatic runs of atrial fibrillation with RVR.  Amiodarone was started to control the atrial fibrillation.  He developed respiratory distress => Bipap => intubated.  He became hypotensive, requiring pressors.  He developed AKI.  PNA was noted and he received broad spectrum abx.  He had a prolonged intubation but was eventually extubated.  Given deconditioning from long hospitalization, he went to CIR for a couple of weeks.  LHC in 9/18 showed occluded PDA with left>right collaterals.  TEE in 9/18 showed EF 50%, severe MR (possibly infarct-related with restricted posterior leaflet, and moderate AS + moderate-severe AI (possibly rheumatic).   In 12/18, he had cardiac surgery with bioprosthetic aortic and mitral valves placed.  He had SVG-PDA, Maze, and LA appendage clipping.  Post-op course was complicated by CHB requiring Medtronic dual chamber PPM (His bundle lead placed but captured right bundle so induced LBBB).   Echo 01/02/18 LVEF 30-35%, bioprosthetic MV and AoV function normally, RV mildly dilated with normal systolic function.    He was noted to be in atrial flutter with mild RVR and had TEE-guided DCCV on 04/11/18. TEE showed EF 25-30%, normal bioprosthetic aortic and mitral valves.  Echo in 6/19 showed EF 30-35%, normal bioprosthetic aortic and mitral  valves.  Echo 12/19 with EF up to 50-55%, normal bioprosthetic mitral and aortic valves, normal RV.   In 2020, he went back into atrial fibrillation.  Amiodarone was started, and he returned to NSR.   Echo in 11/21 showed EF 50-55%, mild LVH, mildly dilated RV with normal function, bioprosthetic mitral valve with mean gradient 5 mmHg and no MR, bioprosthetic aortic valve with mean gradient 12 and no AI.  PFTs in 1/22 were restrictive.  High resolution CT chest was done in 2/22, concerning for ILD, usual interstitial pneumonitis.    He was recently seen by Dr. Aundra Dubin on 2/18 and complained of increased dyspnea, SOB walking ~50 feet, but was not volume overloaded on exam. BP was mildly elevated. EKG was a-paced. Hgb showed mild anemia, Hgb 12.4.  Jardiance 10 mg was added and amlodipine was increased to 5 mg daily. He was also referred to Pulmonology (Dr. Vaughan Browner) for ILD. There was ? Regarding possible amiodarone toxicity, however ESR was checked and though elevated, was not markedly high at 46.   I saw him back for clinic f/u on 3/14. Wt was up 3 lb from previous visit. He remained SOB w/ NYHA Class III symptoms. ReDs clip was mildly elevated at 38%. BP 140/70.  I increased his Entresto to 49-51 mg bid. Of note, he had repeat BMP 1 week after his last visit and he had slight bump in SCr from 1.8 to 2.1. He was instructed to hold lasix x 1 day, then reduce down to 40 mg qam/ 20 mg qpm.      He returns  now for f/u. He was seen by pulmonology on 02/24/21. After discussion w/ Dr. Aundra Dubin, it was decided to trial him off of amiodarone. He was also given Trelegy inhaler. They also checked CTD serologies to rule out any autoimmune process.  CCP was elevated, concerning for possible RA. He has been referred to rheumatology.   He is here today w/ his wife. Volume status improved, ReDs Clip 29%. Wt down 4 lb. He has mild bilateral LEE around his ankles, but may be due to recent addition of amlodipine. Device  interrogated. He has had 2 brief episodes of AT/AF over the last month <1/hr duration. Avg HR in the 60s. He has noted no significant change in his breathing, still NYHA Classs III. BP 122/62/ HR 60 bpm.     ECG: not performed today   Labs (9/18): K 4.5, creatinine 1.42, LDL 90, HDL 36, LFTs normal, TSH normal, hgb 9.6 Labs (10/18): K 4.3, creatinine 1.45 Labs (12/18): K 3.8, creatinine 1.56 Labs (1/19): creatinine 1.68, LDL 66 Labs (3/19): K 4.2, creatinine 1.56 Labs (4/19): K 4.4, creatinine 1.71, hgb 11.8, LDL 75, HDL 43 Labs (6/19): K 4.8, creatinine 2.1, LDL 75, TGs 246 Labs (8/19): K 4.6, creatinine 2.07 Labs (11/19): K 4.6, creatinine 1.29 Labs (2/20): K 4.8, creatinine 1.98 Labs (5/20): LDL 49, HDL 39 Labs (7/20): TSH normal Labs (9/20): TSH normal, K 4.4, creatinine 2.03, hgb 13.5, LFTs normal Labs (12/20): K 4.4, creatinine 1.87, hgb 13.1.  Labs (11/21): LDL 54, K 4.6, creatinine 2.22, LFTs normal, TSH normal Labs (2/22): SCr 1.77, K 4.2, LFTs normal, TSH normal, ESR 46, Hgb 12.4  Labs (3/22): SCr 2.13, K 3.7    PMH: 1. CVA: Right MCA, 2004.  Minimal residual problems.  2. HTN 3. Hyperlipidemia 4. Left rotator cuff surgery 6/18 5. Carotid stenosis: s/p right carotid stent.  6. GERD 7. H/o CCY 8. Anemia: FOBT negative.  9. Gout  10. Atrial fibrillation: Paroxysmal. Maze in 12/18 with LAA clipping.  - Recurrent atrial fibrillation 2020 => amiodarone.  11. Valvular heart disease: TEE (5/18) with EF 55-60%, moderate AS mean 33 mmHg, moderate MR, normal RV size and systolic function.  - Echo (8/18): EF 55-60%, moderate LVH, moderate AS with mean gradient 33 mmHg, moderate AI, moderate to severe MR.  - TEE (9/18): EF 50%, mild LV dilation, suspect severe MR with posterior leaflet restricted (?infarct-related MR), ERO 0.4 cm^2, moderate AS/moderate-severe AI (possibly rheumatic).   - In 12/18, he had cardiac surgery with bioprosthetic aortic and mitral valves placed.  He  had SVG-PDA, Maze, and LA appendage clipping. - Echo (1/19): LVEF 30-35%, bioprosthetic MV and AoV function normally, RV mildly dilated with normal systolic function.  - TEE (5/19) with EF 25-30%, normal bioprosthetic aortic valve, normal bioprosthetic mitral valve. - Echo (6/19): EF 30-35%, mild LV dilation with diffuse hypokinesis, normal RV size with mildly decreased systolic function, normal-appearing bioprosthetic mitral and aortic valves.   - Echo (12/19): EF 50-55%, moderate LVH, bioprosthetic aortic valve with mean gradient 10 mmHg, normally-functioning bioprosthetic mitral valve.  - Echo (11/21): EF 50-55%, mild LVH, mildly dilated RV with normal function, bioprosthetic mitral valve with mean gradient 5 mmHg and no MR, bioprosthetic aortic valve with mean gradient 12 and no AI.   12. Chronic systolic CHF 13. Deafness 14. CKD: stage 3.  15. Suspect COPD 16. 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  17. COPD: Mild obstruction on 9/18 PFTs.  - PFTs (10/20): Mild obstruction, decreased DLCO.  18. Post-op CHB in 12/18 with Medtronic dual chamber PPM.  19. Chronic systolic CHF: Echo (6/37) with EF down to 30-35% post-op.  - TEE (5/19) with EF 25-30%, normal bioprosthetic aortic valve, normal bioprosthetic mitral valve.  - Echo (12/19): EF 50-55%, moderate LVH, bioprosthetic aortic valve with mean gradient 10 mmHg, normally-functioning bioprosthetic mitral valve.  20. Atrial flutter: DCCV in 5/19.  21. OSA: Not using CPAP.  22. Interstitial lung disease: PFTs in 1/22 concerning for restriction.  High resolution chest CT in 2/22 with possible UIP.   SH: Quit smoking 8/18.  Married, 2 children, retired.    Family History  Problem Relation Age of Onset  . Stroke Father        No details  . Angina Mother    Review of systems complete and found to be negative unless  listed in HPI.    Current Outpatient Medications  Medication Sig Dispense Refill  . acetaminophen (TYLENOL) 325 MG tablet Take 1 tablet (325 mg total) by mouth every 6 (six) hours as needed for mild pain. 30 tablet 0  . amLODipine (NORVASC) 5 MG tablet Take 1 tablet (5 mg total) by mouth daily. 90 tablet 3  . calcium carbonate (TUMS - DOSED IN MG ELEMENTAL CALCIUM) 500 MG chewable tablet Chew 1 tablet by mouth daily as needed for indigestion or heartburn.    . carvedilol (COREG) 12.5 MG tablet TAKE 1 TABLET TWICE DAILY WITH MEALS 180 tablet 3  . cetirizine (ZYRTEC) 10 MG tablet Take 10 mg by mouth daily as needed for allergies.    . citalopram (CELEXA) 20 MG tablet Take 1 tablet (20 mg total) by mouth at bedtime. 30 tablet 5  . docusate sodium (COLACE) 100 MG capsule Take 1 capsule (100 mg total) by mouth 3 (three) times daily as needed. 20 capsule 0  . empagliflozin (JARDIANCE) 10 MG TABS tablet TAKE 1 TABLET BY MOUTH ONCE A DAY BEFORE BREAKFAST 90 tablet 3  . EPINEPHrine (EPIPEN JR) 0.15 MG/0.3ML injection     . furosemide (LASIX) 40 MG tablet Take 38m in the AM and 226min the PM. 30 tablet 0  . guaiFENesin (MUCINEX) 600 MG 12 hr tablet Take 600 mg by mouth 2 (two) times daily.    . methocarbamol (ROBAXIN) 500 MG tablet Take 1 tablet (500 mg total) by mouth every 8 (eight) hours as needed for muscle spasms. 30 tablet 0  . pantoprazole (PROTONIX) 40 MG tablet Take 1 tablet (40 mg total) by mouth daily. 30 tablet 1  . Pseudoeph-Doxylamine-DM-APAP (NYQUIL PO) Take by mouth as needed.    . rosuvastatin (CRESTOR) 10 MG tablet TAKE 1 TABLET EVERY DAY 90 tablet 3  . sacubitril-valsartan (ENTRESTO) 49-51 MG TAKE 1 TABLET BY MOUTH 2 TIMES DAILY 60 tablet 6  . spironolactone (ALDACTONE) 25 MG tablet TAKE 1 TABLET EVERY DAY 90 tablet 3  . traZODone (DESYREL) 50 MG tablet Take 100 mg by mouth at bedtime.    . Marland Kitchenarfarin (COUMADIN) 5 MG tablet TAKE 1/2 TO 1 TABLET EVERY DAY AS DIRECTED BY THE COUMADIN  CLINIC 90 tablet 0   No current facility-administered medications for this encounter.   Vitals:   03/22/21 1329  BP: 122/62  Pulse: 60  SpO2: 94%  Weight: 128.5 kg (283 lb 3.2 oz)   Wt Readings from Last 3 Encounters:  03/22/21 128.5 kg (283 lb 3.2 oz)  02/24/21 130.2 kg (287 lb)  02/22/21 129.8 kg (286 lb 3.2 oz)     PHYSICAL EXAM: ReDs Clip 29%  General:  Well appearing, moderately obese. No respiratory difficulty HEENT: normal Neck: supple. no JVD. Carotids 2+ bilat; no bruits. No lymphadenopathy or thyromegaly appreciated. Cor: PMI nondisplaced. Regular rate & rhythm. No rubs, gallops or murmurs. Lungs: + bilateral diffuse rhonchi, no wheezing or crackles Abdomen: obese, soft, nontender, nondistended. No hepatosplenomegaly. No bruits or masses. Good bowel sounds. Extremities: no cyanosis, clubbing, rash, 1+ bilateral pedal/ankle edema Neuro: alert & oriented x 3, cranial nerves grossly intact. moves all 4 extremities w/o difficulty. Affect pleasant.    Assessment/Plan: 1. Valvular heart disease: TEE in 9/18 showed severe MR, probably infarct-related with restrictive posterior mitral leaflet. There was moderate aortic stenosis and moderate-severe aortic insufficiency, possible rheumatic.  In 12/18, he had bioprosthetic MVR and AVR. Valves stable on echo in 11/21.  - Needs SBE prophylaxis  with dental work.  2. Chronic systolic => diastolic CHF: In setting of significant valvular disease as above. EF down to 30-35% post-op (1/19 echo), likely reflects true LV systolic function without the volume load from severe MR and moderate-severe AI.  TEE in 5/19 showed EF 25-30%.  Repeat echo in 6/19 with EF 30-35%. Repeat echo in 12/19 showed EF up to 50-55%, echo in 11/21 again showed EF 50-55% with normal RV systolic function. NYHA class III symptoms, think this may be due mostly to deconditioning and weight gain as well as possible interstitial lung disease. Volume status improved, Wt  down 4 lb from previous visit and ReDs Clip down to 29%. He has mild bilateral pedal edema but suspect this may be from amlodipine (onset of edema correlates to timing of dose increase).  - Will check BNP today and BMP. If renal function ok, will try increasing Entresto to 97-103 bid  -Continue Lasix 40 mg qam/ 20 mg qpm - Continue Coreg 12.5 mg bid. HR limits further titration  - Continue spironolactone 25 mg daily.  - Continue Jardiance 10 mg daily.  3. CKD: Stage 3.   - followed by nephrology - Continue Jardiance for diastolic CHF and CKD.  - check BMP today  4. Atrial fibrillation/flutter: Paroxysmal. He has had a Maze procedure.  He tends to tolerate atrial arrhythmias poorly.  He had atrial flutter with DCCV in 5/19.  Recurrent atrial fibrillation in 2020. Amiodarone recently discontinued 3/22 for ? ILD. Recent ESR elevated but not markedly high at 46. Device interrogated. He has had 2 brief episodes of AT/AF over the last month <1/hr duration. Avg HR in the 60s. - continue to keep off amio for now.  - on coumadin for a/c  5. COPD: Mild obstruction on 10/20 PFTs, decreased DLCO.  He has quit smoking.  Now being followed by pulmonology.  6. CVA: Remote, minimal residual. S/p carotid stenting.  - No ASA given warfarin use.  7. Hyperlipidemia: recent lipid panel 11/21 w/ LDL at goal at 54 mg/dL  - Continue Crestor. Recent HFTs normal  8. Anemia: Fe deficiency.  Recent Hgb ok at 12.4. Denies melena/ hematochezia  9. 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  The artery to the PDA was occluded with left to right collaterals.  He had SVG-PDA in 12/18. Stable w/o CP   - Continue statin, good LDL in 11/21.   -  No ASA given warfarin use.  10. CHB: has Medtronic PPM.  He a-paces a high percentage of the time but there is minimal RV pacing.  11. OSA: Severe OSA, he does not want to use  CPAP.  12. Obesity: Body mass index is 39.5 kg/m. - We discussed diet/exercise for weight loss.  13. HTN: controlled on current regimen, but having LEE w/ amlodipine  - will check BNP to help determine if edema is from amlodipine - plan increase in Entresto if renal function ok. If renal function prohibits further titration, will need addition of hydarlazine + Imdur in an attempt to wean amlodipine - will not titrate coreg in an effort to limit RV pacing - f/u w/ pharm D in 3-4 weeks  14. Interstitial lung disease: Restrictive PFTs in 1/22.  High resolution CT chest concerning for ILD, possibly usual interstitial pneumonitis. ?Amiodarone lung toxicity though was only on low dose (100 mg daily).  ESR recently checked, elevated but not markedly high at 46.  He is now being followed by Dr. Vaughan Browner. Now off of amiodarone + trial of Trelegy.  Breathing unchanged.   - ? Underlying Rheumatoid arthritis, CCP elevated. He has been referred to rheumatology.   F/u in 3-4 weeks w/ PharmD. F/u w/ Dr. Aundra Dubin in 3 months  Lyda Jester, PA-C  03/22/2021

## 2021-03-24 ENCOUNTER — Telehealth (HOSPITAL_COMMUNITY): Payer: Self-pay | Admitting: *Deleted

## 2021-03-24 MED ORDER — HYDRALAZINE HCL 25 MG PO TABS: 25.0000 mg | ORAL_TABLET | Freq: Three times a day (TID) | ORAL | 3 refills | Status: DC

## 2021-03-24 NOTE — Telephone Encounter (Signed)
Pts wife aware of results.    [name], PA-C  03/22/2021 4:46 PM EDT Let pt know that his fluid marker (BNP) was normal. His ReDs clip was also normal at 29%. I suspect his leg edema is due to amlodipine. If the swelling is not bothersome, ok to continue amlodipine. If he wishes to stop it, then would recommend hydralazine 25 mg tid in its place and keep f/u w/ pharmD in several weeks to recheck BP. Given baseline renal function and good fluid status, will not increase Entresto at this time. Continue 49-51 mg dose.

## 2021-03-30 ENCOUNTER — Other Ambulatory Visit (HOSPITAL_COMMUNITY): Payer: Self-pay

## 2021-03-30 MED FILL — Sacubitril-Valsartan Tab 49-51 MG: ORAL | 30 days supply | Qty: 60 | Fill #0 | Status: AC

## 2021-04-11 NOTE — Progress Notes (Signed)
Office Visit Note  Patient: Brandon [name]             Date of Birth: [birth year]           MRN: [id number]             PCP: Maurice Small, MD Referring: Marshell Garfinkel, MD Visit Date: 04/12/2021   Subjective:  New Patient (Initial Visit) (Patient complains of left knee pain and low back pain as well as bilateral calf cramping. )   History of Present Illness: Brandon [name] is a 75 y.o. male with a history of stroke, chronic systolic heart failure, paroxysmal atrial fibrillation, COPD, and CKD stage III here for evaluation of positive CCP Abs in relation to ILD.  He has experienced worsening shortness of breath with exertion over the past several years attributed to combination of heart failure with reduced ejection fraction and his COPD.  However more recent chest imaging and pulmonary function test findings are suggestive for a restrictive defect not attributed to his COPD.  He did also have significant peripheral pedal edema which improved by discontinuing his amlodipine.  His long-term use of amiodarone for atrial fibrillation was discontinued for concern of contributing to ILD/pulmonary fibrosis.  Autoimmune work-up showed highly positive anti-CCP antibodies otherwise normal and negative for increased sed rate. He has history of joint pain since many years ago especially in the low back.  He developed worsening back pain with radiation in the mid 80s has had lumbar spine surgery for this problem.  Besides the low back he has also had bilateral shoulder and bilateral knee pain for years.  He has had rotator cuff repair surgery in the shoulder but still has pain associated with use especially overhead abduction.  His knee pain is worst while walking on it especially the left does have episodes of suddenly giving way and instability for which he is walking with a cane or walker.  He has noticed swelling in the left knee occasionally in the past but not recently and not a chronic problem.    Activities of Daily Living:  Patient reports morning stiffness for 0 minutes.   Patient Reports nocturnal pain.  Difficulty dressing/grooming: Denies Difficulty climbing stairs: Denies Difficulty getting out of chair: Denies Difficulty using hands for taps, buttons, cutlery, and/or writing: Reports  Review of Systems  Constitutional: Positive for fatigue.  HENT: Negative for mouth sores, mouth dryness and nose dryness.   Eyes: Positive for pain, itching and dryness. Negative for visual disturbance.  Respiratory: Positive for cough, shortness of breath and difficulty breathing. Negative for hemoptysis.   Cardiovascular: Positive for swelling in legs/feet. Negative for chest pain and palpitations.  Gastrointestinal: Negative for abdominal pain, blood in stool, constipation and diarrhea.  Endocrine: Negative for increased urination.  Genitourinary: Negative for painful urination.  Musculoskeletal: Positive for arthralgias, joint pain, myalgias and myalgias. Negative for joint swelling, muscle weakness, morning stiffness and muscle tenderness.  Skin: Negative for color change, rash and redness.  Allergic/Immunologic: Negative for susceptible to infections.  Neurological: Positive for weakness. Negative for dizziness, numbness, headaches and memory loss.  Hematological: Negative for swollen glands.  Psychiatric/Behavioral: Positive for sleep disturbance. Negative for confusion.    PMFS History:  Patient Active Problem List   Diagnosis Date Noted  . Morbid obesity ([place]) 04/12/2021  . Positive anti-CCP test 04/12/2021  . Generalized osteoarthritis 04/12/2021  . Complete heart block ([place]) 12/15/2020  . Pacemaker 12/15/2020  . Chronic systolic heart failure ([place]) 01/15/2018  .  Encounter for therapeutic drug monitoring 11/27/2017  . S/P aortic + mitral valve replacement with bioprosthetic valves + CABG x1 + maze procedure 11/16/2017  . S/P mitral valve replacement with bioprosthetic  valve 11/16/2017  . S/P CABG x 1 11/16/2017  . S/P Maze operation for atrial fibrillation 11/16/2017  . Coronary artery disease involving native coronary artery of native heart without angina pectoris   . Paroxysmal atrial fibrillation (HCC)   . Acute on chronic diastolic (congestive) heart failure ([place])   . Dysphagia, post-stroke   . Stage 3 chronic kidney disease ([place])   . Debility 08/07/2017  . Tobacco abuse   . Acute blood loss anemia   . Anemia of chronic disease   . History of CVA (cerebrovascular accident)   . Adjustment disorder with mixed anxiety and depressed mood   . Chronic low back pain   . Dysphagia   . Agitation   . Pressure injury of skin 07/20/2017  . Ventilator dependence ([place])   . Goals of care, counseling/discussion   . Palliative care encounter   . Acute respiratory failure with hypoxemia ([place])   . Acute pulmonary edema (HCC)   . Hypotensive episode   . Arrhythmia 07/13/2017  . Aortic insufficiency with aortic stenosis 07/13/2017  . Anemia 07/13/2017  . Dyspnea 07/13/2017  . Respiratory distress 07/13/2017  . S/P left rotator cuff repair 05/18/2017  . Mitral regurgitation   . HOH (hard of hearing) 06/02/2013  . Obesity (BMI 30-39.9) 06/02/2013  . Acute cholecystitis with chronic cholecystitis 06/01/2013  . Calculus of bile duct without mention of cholecystitis or obstruction 05/31/2013  . Nonspecific elevation of levels of transaminase or lactic acid dehydrogenase (LDH) 05/31/2013  . Nonspecific (abnormal) findings on radiological and other examination of biliary tract 05/31/2013  . TOBACCO ABUSE 02/22/2010  . DYSLIPIDEMIA 06/25/2009  . MITRAL INSUFFICIENCY 06/25/2009  . Essential hypertension 06/25/2009  . Aortic valve disorder 06/25/2009  . VALVULAR HEART DISEASE 06/25/2009  . Cardiovascular disease 06/25/2009  . CEREBROVASCULAR DISEASE 06/25/2009  . CEREBROVASCULAR ACCIDENT WITH RIGHT HEMIPARESIS 06/25/2009  . COPD exacerbation ([place]) 06/25/2009   . FOOT SURGERY, HX OF 06/25/2009  . POLYPECTOMY, HX OF 06/25/2009  . TONSILLECTOMY, HX OF 06/25/2009    Past Medical History:  Diagnosis Date  . Anemia 07/13/2017  . Anxiety   . Aortic insufficiency   . Aortic stenosis, moderate 07/13/2017  . Arthritis    back   . Carotid stenosis    Right carotid stent (widely patent) 40 - 59% left plaque 11/13  . CHF (congestive heart failure) ([place])   . Chronic kidney disease   . COPD (chronic obstructive pulmonary disease) ([place])   . Coronary artery disease involving native coronary artery of native heart without angina pectoris   . Depression   . Dyslipidemia   . GERD (gastroesophageal reflux disease)   . Heart murmur   . Hemiplegia affecting unspecified side, late effect of cerebrovascular disease    resolved- from L side   . Hypertension   . Jaundice    resolved following ERCP & Cholecystectomy  . Mild emphysema ([place])   . Mitral regurgitation   . Mitral valve insufficiency and aortic valve insufficiency   . Myocardial infarction ([place])   . Paroxysmal atrial fibrillation (HCC)   . Pre-diabetes    per spouse  . S/P aortic valve replacement with bioprosthetic valve 11/16/2017   23 mm [facility] Ease stented bovine pericardial tissue valve  . S/P CABG x 1 11/16/2017   SVG to  PDA with EVH via right thigh  . S/P Maze operation for atrial fibrillation 11/16/2017   Left side lesion set using bipolar radiofrequency and cryothermy ablation with clipping of LA appendage  . S/P mitral valve replacement with bioprosthetic valve 11/16/2017   27 mm [facility] Mitral stented bovine pericardial tissue valve  . Sleep apnea    does not wear CPAP  . Sleep concern    resulted in surgery- after + sleep test. Pt. doesn't have a problem any longer.   . Stroke ([place]) 03/11/2003   stent placed on the 31, 3, 2004, L side   . Wears glasses   . Wears hearing aid in both ears   . Wears partial dentures     Family History  Problem Relation Age of Onset  .  Stroke Father        No details  . Angina Mother   . Kidney cancer Brother    Past Surgical History:  Procedure Laterality Date  . AORTIC VALVE REPLACEMENT N/A 11/16/2017   Procedure: AORTIC VALVE REPLACEMENT (AVR) with 30m Magna Ease;  Surgeon: ORexene Alberts MD;  [place]  Service: Open Heart Surgery;  Laterality: N/A;  . ARTERIAL LINE INSERTION Right 11/16/2017   Procedure: ARTERIAL LINE INSERTION -RIGHT FEMORAL;  Surgeon: ORexene Alberts MD;  [place] [facility]  Service: Open Heart Surgery;  Laterality: Right;  . BACK SURGERY     lumbar back  . CARDIOVERSION N/A 04/11/2018   Procedure: CARDIOVERSION;  Surgeon: MLarey Dresser MD;  [facility];  Service: Cardiovascular;  Laterality: N/A;  . CHOLECYSTECTOMY    . CORONARY ARTERY BYPASS GRAFT N/A 11/16/2017   Procedure: CORONARY ARTERY BYPASS GRAFTING (CABG) x 1;  Surgeon: ORexene Alberts MD;  [place] [facility]  Service: Open Heart Surgery;  Laterality: N/A;  . ENDOVEIN HARVEST OF GREATER SAPHENOUS VEIN Right 11/16/2017   Procedure: ENDOVEIN HARVEST OF GREATER SAPHENOUS VEIN;  Surgeon: ORexene Alberts MD;  [facility]  Service: Open Heart Surgery;  Laterality: Right;  . ERCP N/A 05/31/2013   Procedure: ENDOSCOPIC RETROGRADE CHOLANGIOPANCREATOGRAPHY (ERCP);  Surgeon: MLadene Artist MD;  [facility];  Service: Endoscopy;  Laterality: N/A;  . FOOT SURGERY     right  . IR RADIOLOGY PERIPHERAL GUIDED IV START  09/28/2017  . IR [place] ACCESS RIGHT  09/28/2017  . LAPAROSCOPIC CHOLECYSTECTOMY SINGLE PORT N/A 06/01/2013   Procedure: LAPAROSCOPIC CHOLECYSTECTOMY SINGLE PORT;  Surgeon: SAdin Hector MD;  [facility];  Service: General;  Laterality: N/A;  . MAZE N/A 11/16/2017   Procedure: MAZE;  Surgeon: ORexene Alberts MD;  [facility] [facility]  Service: Open Heart Surgery;  Laterality: N/A;  . MITRAL VALVE REPAIR N/A 11/16/2017   Procedure: MITRAL VALVE  REPLACEMENT with 215mMagnaEase;  Surgeon:  OwRexene AlbertsMD;  [facility] Service: Open Heart Surgery;  Laterality: N/A;  . PACEMAKER IMPLANT N/A 11/21/2017   Procedure: PACEMAKER IMPLANT;  Surgeon: TaEvans LanceMD;  [facility];  Service: Cardiovascular;  Laterality: N/A;  . POLYPECTOMY    . RIGHT/LEFT HEART CATH AND CORONARY ANGIOGRAPHY N/A 08/30/2017   Procedure: RIGHT/LEFT HEART CATH AND CORONARY ANGIOGRAPHY;  Surgeon: McLarey DresserMD;  [facility];  Service: Cardiovascular;  Laterality: N/A;  . SHOULDER ARTHROSCOPY WITH ROTATOR CUFF REPAIR AND SUBACROMIAL DECOMPRESSION Left 05/18/2017  . SHOULDER ARTHROSCOPY WITH ROTATOR CUFF REPAIR AND SUBACROMIAL DECOMPRESSION Left 05/18/2017  Procedure: SHOULDER ARTHROSCOPY WITH ROTATOR CUFF REPAIR AND SUBACROMIAL DECOMPRESSION;  Surgeon: Tania Ade, MD;  Location: [place];  Service: Orthopedics;  Laterality: Left;  LEFT SHOULDER ARTHROSCOPY WITH ROTATOR CUFF REPAIR AND SUBACROMIAL DECOMPRESSION  . TEE WITHOUT CARDIOVERSION N/A 05/09/2017   Procedure: TRANSESOPHAGEAL ECHOCARDIOGRAM (TEE);  Surgeon: Skeet Latch, MD;  Location: [place];  Service: Cardiovascular;  Laterality: N/A;  . TEE WITHOUT CARDIOVERSION N/A 08/30/2017   Procedure: TRANSESOPHAGEAL ECHOCARDIOGRAM (TEE);  Surgeon: Larey Dresser, MD;  [facility];  Service: Cardiovascular;  Laterality: N/A;  . TEE WITHOUT CARDIOVERSION N/A 11/16/2017   Procedure: TRANSESOPHAGEAL ECHOCARDIOGRAM (TEE);  Surgeon: Rexene Alberts, MD;  Location: [place];  Service: Open Heart Surgery;  Laterality: N/A;  . TEE WITHOUT CARDIOVERSION N/A 04/11/2018   Procedure: TRANSESOPHAGEAL ECHOCARDIOGRAM (TEE);  Surgeon: Larey Dresser, MD;  Location: [facility] ENDOSCOPY;  Service: Cardiovascular;  Laterality: N/A;  . TONSILLECTOMY     Social History   Social History Narrative   Two living children.  Lives with wife.     Immunization History  Administered Date(s) Administered  . PFIZER(Purple  Top)SARS-COV-2 Vaccination 02/06/2020, 03/04/2020, 09/22/2020  . Pneumococcal Polysaccharide-23 08/08/2017     Objective: Vital Signs: BP 117/83 (BP Location: Right Arm, Patient Position: Sitting, Cuff Size: Large)   Pulse 76   Ht '5\' 8"'$  (1.727 m)   Wt 281 lb 9.6 oz (127.7 kg)   BMI 42.82 kg/m    Physical Exam Constitutional:      Appearance: He is obese.  HENT:     Right Ear: External ear normal.     Left Ear: External ear normal.     Mouth/Throat:     Mouth: Mucous membranes are moist.     Pharynx: Oropharynx is clear.  Eyes:     Comments: Left conjunctival injection  Cardiovascular:     Rate and Rhythm: Normal rate and regular rhythm.  Pulmonary:     Effort: Pulmonary effort is normal.     Breath sounds: Normal breath sounds.  Skin:    General: Skin is warm and dry.     Findings: No rash.  Neurological:     General: No focal deficit present.     Mental Status: He is alert.  Psychiatric:        Mood and Affect: Mood normal.    Musculoskeletal Exam:  Neck full ROM no tenderness Shoulders full ROM no tenderness or swelling, pain with resisted overhead abduction going to the lateral shoulder Elbows full ROM no tenderness or swelling Wrists full ROM no tenderness or swelling Fingers full ROM no tenderness or swelling Hip normal internal and external rotation without pain, no tenderness to lateral hip palpation Knees show a good range of motion, patellofemoral crepitus is mild, in the left knee medial and lateral joint line tenderness is provoked with varus and valgus pressure but not direct palpation, no effusions appreciated Ankles full ROM no tenderness or swelling  Investigation: No additional findings.  Imaging: No results found.  Recent Labs: Lab Results  Component Value Date   WBC 7.5 02/24/2021   HGB 12.3 (L) 02/24/2021   PLT 229.0 02/24/2021   NA 137 03/22/2021   K 4.9 03/22/2021   CL 104 03/22/2021   CO2 22 03/22/2021   GLUCOSE 106 (H) 03/22/2021    BUN 35 (H) 03/22/2021   CREATININE 2.12 (H) 03/22/2021   BILITOT 1.4 (H) 01/29/2021   ALKPHOS 31 (L) 01/29/2021   AST 16 01/29/2021   ALT 10 01/29/2021  PROT 7.4 01/29/2021   ALBUMIN 3.8 01/29/2021   CALCIUM 8.8 (L) 03/22/2021   GFRAA 40 (L) 11/20/2019    Speciality Comments: No specialty comments available.  Procedures:  No procedures performed Allergies: Bee venom   Assessment / Plan:     Visit Diagnoses: Chronic midline low back pain with sciatica, sciatica laterality unspecified  Chronic low back pain is consistent with degenerative disc disease likely exacerbated by previous injury and herniation status post surgery over 30 years ago.  Symptoms do not sound typical for an ankylosing spondylitis and lumbar spine is a typical distribution for RA disease.  Positive anti-CCP test  Highly positive anti-CCP antibody test with smoking history but he does not demonstrate any inflammatory joint changes on exam today.  Bilateral shoulder arthritis can be a presentation of RA but would be atypical with no other upper extremity joint inflammation seen.  I discussed inflammatory lung disease related to CCP antibodies develop years before and joint problems, but at this time no clinical evidence for him having rheumatoid arthritis.  Generalized osteoarthritis  We discussed for some time generalized osteoarthritis in the back shoulders and knees that is causing pain and some impairment in his mobility.  He describes knees giving way intermittently but not having any falls to ground.  This sounds suggestive for degenerative meniscal tear of the knee no particular indication for aggressive management at this time and he is not interested in referral to physical therapy at this time but discussed this as an option.  Orders: No orders of the defined types were placed in this encounter.  No orders of the defined types were placed in this encounter.    Follow-Up Instructions: Return if  symptoms worsen or fail to improve.   Collier Salina, MD  Note - This record has been created using [employer].  Chart creation errors have been sought, but may not always  have been located. Such creation errors do not reflect on  the standard of medical care.

## 2021-04-12 ENCOUNTER — Other Ambulatory Visit: Payer: Self-pay

## 2021-04-12 ENCOUNTER — Encounter: Payer: Self-pay | Admitting: Internal Medicine

## 2021-04-12 ENCOUNTER — Ambulatory Visit: Payer: Medicare HMO | Admitting: Internal Medicine

## 2021-04-12 VITALS — BP 117/83 | HR 76 | Ht 68.0 in | Wt 281.6 lb

## 2021-04-12 DIAGNOSIS — R768 Other specified abnormal immunological findings in serum: Secondary | ICD-10-CM | POA: Diagnosis not present

## 2021-04-12 DIAGNOSIS — M5440 Lumbago with sciatica, unspecified side: Secondary | ICD-10-CM | POA: Diagnosis not present

## 2021-04-12 DIAGNOSIS — G8929 Other chronic pain: Secondary | ICD-10-CM | POA: Diagnosis not present

## 2021-04-12 DIAGNOSIS — M159 Polyosteoarthritis, unspecified: Secondary | ICD-10-CM

## 2021-04-12 NOTE — Patient Instructions (Addendum)
Osteoarthritis  Osteoarthritis is a type of arthritis. It refers to joint pain or joint disease. Osteoarthritis affects tissue that covers the ends of bones in joints (cartilage). Cartilage acts as a cushion between the bones and helps them move smoothly. Osteoarthritis occurs when cartilage in the joints gets worn down. Osteoarthritis is sometimes called "wear and tear" arthritis. Osteoarthritis is the most common form of arthritis. It often occurs in older people. It is a condition that gets worse over time. The joints most often affected by this condition are in the fingers, toes, hips, knees, and spine, including the neck and lower back. What are the causes? This condition is caused by the wearing down of cartilage that covers the ends of bones. What increases the risk? The following factors may make you more likely to develop this condition:  Being age 52 or older.  Obesity.  Overuse of joints.  Past injury of a joint.  Past surgery on a joint.  Family history of osteoarthritis. What are the signs or symptoms? The main symptoms of this condition are pain, swelling, and stiffness in the joint. Other symptoms may include:  An enlarged joint.  More pain and further damage caused by small pieces of bone or cartilage that break off and float inside of the joint.  Small deposits of bone (osteophytes) that grow on the edges of the joint.  A grating or scraping feeling inside the joint when you move it.  Popping or creaking sounds when you move.  Difficulty walking or exercising.  An inability to grip items, twist your hand(s), or control the movements of your hands and fingers. How is this diagnosed? This condition may be diagnosed based on:  Your medical history.  A physical exam.  Your symptoms.  X-rays of the affected joint(s).  Blood tests to rule out other types of arthritis. How is this treated? There is no cure for this condition, but treatment can help  control pain and improve joint function. Treatment may include a combination of therapies, such as:  Pain relief techniques, such as: ? Applying heat and cold to the joint. ? Massage. ? A form of talk therapy called cognitive behavioral therapy (CBT). This therapy helps you set goals and follow up on the changes that you make.  Medicines for pain and inflammation. The medicines can be taken by mouth or applied to the skin. They include: ? NSAIDs, such as ibuprofen. ? Prescription medicines. ? Strong anti-inflammatory medicines (corticosteroids). ? Certain nutritional supplements.  A prescribed exercise program. You may work with a physical therapist.  Assistive devices, such as a brace, wrap, splint, specialized glove, or cane.  A weight control plan.  Surgery, such as: ? An osteotomy. This is done to reposition the bones and relieve pain or to remove loose pieces of bone and cartilage. ? Joint replacement surgery. You may need this surgery if you have advanced osteoarthritis. Follow these instructions at home: Activity  Rest your affected joints as told by your health care provider.  Exercise as told by your health care provider. He or she may recommend specific types of exercise, such as: ? Strengthening exercises. These are done to strengthen the muscles that support joints affected by arthritis. ? Aerobic activities. These are exercises, such as brisk walking or water aerobics, that increase your heart rate. ? Range-of-motion activities. These help your joints move more easily. ? Balance and agility exercises. Managing pain, stiffness, and swelling  If directed, apply heat to the affected area as  often as told by your health care provider. Use the heat source that your health care provider recommends, such as a moist heat pack or a heating pad. ? If you have a removable assistive device, remove it as told by your health care provider. ? Place a towel between your skin and the  heat source. If your health care provider tells you to keep the assistive device on while you apply heat, place a towel between the assistive device and the heat source. ? Leave the heat on for 20-30 minutes. ? Remove the heat if your skin turns bright red. This is especially important if you are unable to feel pain, heat, or cold. You may have a greater risk of getting burned.  If directed, put ice on the affected area. To do this: ? If you have a removable assistive device, remove it as told by your health care provider. ? Put ice in a plastic bag. ? Place a towel between your skin and the bag. If your health care provider tells you to keep the assistive device on during icing, place a towel between the assistive device and the bag. ? Leave the ice on for 20 minutes, 2-3 times a day. ? Move your fingers or toes often to reduce stiffness and swelling. ? Raise (elevate) the injured area above the level of your heart while you are sitting or lying down.      General instructions  Take over-the-counter and prescription medicines only as told by your health care provider.  Maintain a healthy weight. Follow instructions from your health care provider for weight control.  Do not use any products that contain nicotine or tobacco, such as cigarettes, e-cigarettes, and chewing tobacco. If you need help quitting, ask your health care provider.  Use assistive devices as told by your health care provider.  Keep all follow-up visits as told by your health care provider. This is important. Where to find more information  [employer] of Arthritis and Musculoskeletal and Skin Diseases: www.niams.SouthExposed.es  [employer] on Aging: http://kim-miller.com/  American College of Rheumatology: www.rheumatology.org Contact a health care provider if:  You have redness, swelling, or a feeling of warmth in a joint that gets worse.  You have a fever along with joint or muscle aches.  You develop a  rash.  You have trouble doing your normal activities. Get help right away if:  You have pain that gets worse and is not relieved by pain medicine. Summary  Osteoarthritis is a type of arthritis that affects tissue covering the ends of bones in joints (cartilage).  This condition is caused by the wearing down of cartilage that covers the ends of bones.  The main symptom of this condition is pain, swelling, and stiffness in the joint.  There is no cure for this condition, but treatment can help control pain and improve joint function. This information is not intended to replace advice given to you by your health care provider. Make sure you discuss any questions you have with your health care provider. Document Revised: 11/25/2019 Document Reviewed: 11/25/2019 Elsevier Patient Education  [place].    Anticyclic-Citrullinated Peptide Antibody Test Why am I having this test? You may have the anticyclic-citrullinated peptide antibody test done to help:  Diagnose rheumatoid arthritis (RA). RA is a long-term (chronic) disease that causes inflammation in the joints.  Determine the severity of your RA, including how much worse it is getting (progression). This test may be done if you have unexplained  joint inflammation and have previously tested negative for rheumatoid factor. It may also be done if you have been diagnosed with undifferentiated arthritis and your health care provider suspects rheumatoid arthritis. What is being tested? This test checks your blood for the presence of anticyclic-citrullinated peptide antibodies. Antibodies are cells that are part of the body's disease-fighting (immune) system. These antibodies appear early in the course of RA and are thought to be directly involved in the progression of the disease. What kind of sample is taken? A blood sample is required for this test. It is usually collected by inserting a needle into a blood vessel.   How are the  results reported? Your test results will be reported as either positive or negative. A result is considered negative if there is less than 20 units of the antibody per mL of blood. What do the results mean?  A positive blood test may mean that you have RA.  A negative blood test means that it is less likely that you have RA. However, a negative test does not completely rule out rheumatoid arthritis. Talk with your health care provider about what your results mean. Questions to ask your health care provider Ask your health care provider, or the department that is doing the test:  When will my results be ready?  How will I get my results?  What are my treatment options?  What other tests do I need?  What are my next steps? Summary  The anticyclic-citrullinated peptide antibody blood test may be done to help your health care provider diagnose rheumatoid arthritis (RA).  This test checks your blood for the presence of anticyclic-citrullinated peptide antibodies. These antibodies appear early in the course of RA.  A positive blood test may mean that you have RA. This information is not intended to replace advice given to you by your health care provider. Make sure you discuss any questions you have with your health care provider. Document Revised: 03/18/2020 Document Reviewed: 03/18/2020 Elsevier Patient Education  2021 [employer].

## 2021-04-13 ENCOUNTER — Ambulatory Visit (INDEPENDENT_AMBULATORY_CARE_PROVIDER_SITE_OTHER): Payer: Medicare HMO | Admitting: *Deleted

## 2021-04-13 ENCOUNTER — Other Ambulatory Visit: Payer: Self-pay

## 2021-04-13 DIAGNOSIS — Z5181 Encounter for therapeutic drug level monitoring: Secondary | ICD-10-CM | POA: Diagnosis not present

## 2021-04-13 DIAGNOSIS — Z953 Presence of xenogenic heart valve: Secondary | ICD-10-CM | POA: Diagnosis not present

## 2021-04-13 LAB — POCT INR: INR: 2.4 (ref 2.0–3.0)

## 2021-04-13 NOTE — Patient Instructions (Signed)
Description   Continue taking 1/2 tablet daily except for 1 tablet on Mondays, and Fridays. Recheck INR in 6 weeks. Amio stopped on 02/25/21. Call with any new medications, scheduled procedures or any questions  [phone]

## 2021-04-22 ENCOUNTER — Telehealth: Payer: Self-pay | Admitting: Emergency Medicine

## 2021-04-22 NOTE — Telephone Encounter (Signed)
Called patient and left message for patient to call office back at [phone]. [facility] alert for ongoing A-fib. Increased burden from<0.1% to 6.9%.

## 2021-04-26 NOTE — Telephone Encounter (Signed)
Called and spoke with patient's wife, [name] on file, as pt was unavailable.  Brandon [name] is agreeable to appt 05/03/21 at 1:30 pm with Adline Peals, PA-C.

## 2021-04-26 NOTE — Telephone Encounter (Signed)
Pt spouse returned phone call.  They were camping last week.    Assisted with sending a manual transmission.    Pt wife reports pt is compliant with all currently prescribed meds.  She does report it has been at least 1 month since patient stopped taking Amiodarone as directed by Pulmonology/ Dr. Aundra Dubin.    Manual transmission received, pt is still in AF.  Burden is now 98.8%.  Patient is asymptomatic at this time.      Patient was previously managed by AF clinic.  Advised I would send information there fore follow-up.

## 2021-04-28 ENCOUNTER — Encounter: Payer: Self-pay | Admitting: Pulmonary Disease

## 2021-04-28 ENCOUNTER — Other Ambulatory Visit (HOSPITAL_COMMUNITY): Payer: Self-pay | Admitting: Cardiology

## 2021-04-28 ENCOUNTER — Ambulatory Visit: Payer: Medicare HMO | Admitting: Pulmonary Disease

## 2021-04-28 ENCOUNTER — Other Ambulatory Visit: Payer: Self-pay

## 2021-04-28 VITALS — BP 134/86 | HR 103 | Temp 98.0°F | Ht 71.0 in | Wt 278.0 lb

## 2021-04-28 DIAGNOSIS — R0600 Dyspnea, unspecified: Secondary | ICD-10-CM

## 2021-04-28 NOTE — Addendum Note (Signed)
Addended by: Gavin Potters R on: 04/28/2021 12:26 PM   Modules accepted: Orders

## 2021-04-28 NOTE — Patient Instructions (Signed)
We will get a repeat high-resolution CT and PFTs in the next month Follow-up in clinic after these were reviewed

## 2021-04-28 NOTE — Progress Notes (Signed)
PCP: Dr. Justin Mend Cardiology: Dr. Radford Pax HF Cardiology: Dr. Aundra Dubin  HPI:  75 y.o.with history of CVA, paroxysmal atrial fibrillation, valvular heart disease, and chronic diastolic CHF presents for evaluation of CHF and valvular heart disease.    He had had episodes of dyspnea and initial workup led to a TEE in 5/18 showing normal EF with moderate AS and moderate MR.  He continued to have episodic dyspnea and ended up admitted in 8/18 with shortness of breath and chest pressure.  This led to a long, complicated hospitalization.  He was noted to be volume overloaded with acute diastolic CHF and was also noted to have symptomatic runs of atrial fibrillation with RVR.  Amiodarone was started to control the atrial fibrillation.  He developed respiratory distress => Bipap => intubated.  He became hypotensive, requiring pressors.  He developed AKI.  PNA was noted and he received broad spectrum abx.  He had a prolonged intubation but was eventually extubated.  Given deconditioning from long hospitalization, he went to CIR for a couple of weeks.  LHC in 9/18 showed occluded PDA with left>right collaterals.  TEE in 9/18 showed EF 50%, severe MR (possibly infarct-related with restricted posterior leaflet, and moderate AS + moderate-severe AI (possibly rheumatic).   In 12/18, he had cardiac surgery with bioprosthetic aortic and mitral valves placed.  He had SVG-PDA, Maze, and LA appendage clipping.  Post-op course was complicated by CHB requiring Medtronic dual chamber PPM (His bundle lead placed but captured right bundle so induced LBBB).   Echo 01/02/18 LVEF 30-35%, bioprosthetic MV and AoV function normally, RV mildly dilated with normal systolic function.    He was noted to be in atrial flutter with mild RVR and had TEE-guided DCCV on 04/11/18. TEE showed EF 25-30%, normal bioprosthetic aortic and mitral valves.  Echo in 6/19 showed EF 30-35%, normal bioprosthetic aortic and mitral valves.  Echo 12/19 with EF up  to 50-55%, normal bioprosthetic mitral and aortic valves, normal RV.   In 2020, he went back into atrial fibrillation.  Amiodarone was started, and he returned to NSR.   Echo in 11/21 showed EF 50-55%, mild LVH, mildly dilated RV with normal function, bioprosthetic mitral valve with mean gradient 5 mmHg and no MR, bioprosthetic aortic valve with mean gradient 12 and no AI.  PFTs in 1/22 were restrictive.  High resolution CT chest was done in 2/22, concerning for ILD, usual interstitial pneumonitis.    He was seen by Dr. Aundra Dubin on 01/29/21 and complained of increased dyspnea, SOB walking ~50 feet, but was not volume overloaded on exam. BP was mildly elevated. EKG was a-paced. Hgb showed mild anemia, Hgb 12.4.  Jardiance 10 mg was added and amlodipine was increased to 5 mg daily. He was also referred to Pulmonology (Dr. Vaughan Browner) for ILD. There was ? Regarding possible amiodarone toxicity, however ESR was checked and though elevated, was not markedly high at 46.   He was seen back for clinic follow up with Lyda Jester, PA on 3/14. Wt was up 3 lb from previous visit. He remained SOB w/ NYHA Class III symptoms. ReDs clip was mildly elevated at 38%. BP 140/70.  Entresto was increased to 49-51 mg BID. He had repeat BMP 1 week after this visit and he had slight bump in SCr from 1.8 to 2.1. He was instructed to hold lasix x 1 day, then reduce down to 40 mg qam/ 20 mg qpm.      He was seen by pulmonology on 02/24/21.  After discussion w/ Dr. Aundra Dubin, it was decided to trial him off of amiodarone. He was also given Trelegy inhaler. They also checked CTD serologies to rule out any autoimmune process.  CCP was elevated, concerning for possible RA. He has been referred to rheumatology.   He returned to clinic for follow up with Lyda Jester, PA on 4/11. Volume status improved, ReDs Clip 29%. Wt down 4 lb. He had mild bilateral LEE around his ankles, but may have been due to recent addition of amlodipine.  Device interrogated. He has had 2 brief episodes of AT/AF over the last month <1/hr duration. Avg HR in the 60s. He has noted no significant change in his breathing, still NYHA Classs III. BP 122/62, HR 60 bpm. His amlodipine was stopped and he was started on hydralazine 25 mg TID. His potassium during this visit was elevated to 4.9.  During last manual transmission of device on 5/12, he was found to be still in afib, burden now 98.8% but asymptomatic. It was recommended that he follow with afib clinic for further management.   He has an appointment with pulmonology on 5/17 and noted that he stopped using the Trelegy inhaler because it made him cough. They recommended repeat high resolution CT and PFTs now that he has been off amiodarone for several months.   Today he returns to HF clinic for pharmacist medication titration. Overall, he is feeling well. He denies any lightheadedness or dizziness. He states he never started hydralazine after reading about the side effects of the medication but confirms he stopped amlodipine. He has some fatigue but says this is more chronic. He denies having chest pain or palpitations. He is asymptomatic in afib. He says his breathing is doing ok and denies any shortness of breath. He is able to complete all ADLs and paces himself on exertion. His weight has been stable at home. He takes furosemide 40 mg every morning and 20 mg every afternoon. Denies PND, LE edema, or orthopnea. Lungs clear on exam. His appetite has been good.  HF Medications: Carvedilol 12.5 mg BID Entresto 49/51 mg BID Spironolactone 25 mg daily Jardiance 10 mg daily Furosemide 40 mg daily every morning and 20 mg every afternoon  Has the patient been experiencing any side effects to the medications prescribed?  no  Does the patient have any problems obtaining medications due to transportation or finances?   No - [employer] for [place]. Interested in patient assistance programs  once grant is maxed out.  Understanding of regimen: excellent Understanding of indications: excellent Potential of compliance: excellent Patient understands to avoid NSAIDs. Patient understands to avoid decongestants.    Pertinent Lab Values: . From 03/22/21: Serum creatinine 2.12, BUN 35, Potassium 4.9, Sodium 137, BNP 90.4   Vital Signs: . Weight: 277 lbs (last clinic weight: 281 lbs) . Blood pressure: 110/68  . Heart rate: 102   Assessment/Plan: 1. Valvular heart disease: TEE in 9/18 showed severe MR, probably infarct-related with restrictive posterior mitral leaflet. There was moderate aortic stenosis and moderate-severe aortic insufficiency, possible rheumatic.  In 12/18, he had bioprosthetic MVR and AVR. Valves stable on echo in 11/21.  - Needs SBE prophylaxis  with dental work.  2. Chronic systolic => diastolic CHF: In setting of significant valvular disease as above. EF down to 30-35% post-op (1/19 echo), likely reflects true LV systolic function without the volume load from severe MR and moderate-severe AI.  TEE in 5/19 showed EF 25-30%.  Repeat echo in 6/19  with EF 30-35%. Repeat echo in 12/19 showed EF up to 50-55%, echo in 11/21 again showed EF 50-55% with normal RV systolic function. NYHA class II symptoms. Volume status improved, Wt down 4 lb from previous visit. His LE edema has improved with stopping amlodipine.  - Continue Entresto 49/51 mg BID  -Continue Lasix 40 mg qam / 20 mg qpm - Increase carvedilol to 18.75 mg BID - Continue spironolactone 25 mg daily.  - Continue Jardiance 10 mg daily.  3. CKD: Stage 3.   - followed by nephrology - Continue Jardiance for diastolic CHF and CKD.  4. Atrial fibrillation/flutter: Paroxysmal. He has had a Maze procedure.  He tends to tolerate atrial arrhythmias poorly.  He had atrial flutter with DCCV in 5/19.  Recurrent atrial fibrillation in 2020. Amiodarone recently discontinued 3/22 for ? ILD. Recent ESR elevated but not markedly  high at 46. Avg HR in the 60s. - continue to keep off amio for now. sees afib clinic next week - on coumadin for a/c  5. COPD: Mild obstruction on 10/20 PFTs, decreased DLCO.  He has quit smoking.  Now being followed by pulmonology. Off Trelegy due to cough. Repeat high resolution CT and PFTs per pulmonology. CT scheduled for 5/28 and PFTs on 6/30 per patient 6. CVA: Remote, minimal residual. S/p carotid stenting.  - No ASA given warfarin use.  7. Hyperlipidemia: recent lipid panel 11/21 w/ LDL at goal at 54 mg/dL  - Continue Crestor. Recent LFTs normal  8. Anemia: Fe deficiency.  Recent Hgb ok at 12.4. Denies melena/ hematochezia  9. 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  The artery to the PDA was occluded with left to right collaterals.  He had SVG-PDA in 12/18. Stable w/o CP   - Continue statin, good LDL in 11/21.   - No ASA given warfarin use.  10. CHB: has Medtronic PPM.  He a-paces a high percentage of the time but there is minimal RV pacing.  11. OSA: Severe OSA, he does not want to use CPAP.  12. Obesity: Body mass index is 38.63 kg/m. - We discussed diet/exercise for weight loss.  13. HTN: controlled on current regimen - Stopped amlodipine due to LE edema. Never started hydralazine due to concern about side effects.  - Continue Entresto 49/51 mg BID 14. Interstitial lung disease: Restrictive PFTs in 1/22.  High resolution CT chest concerning for ILD, possibly usual interstitial pneumonitis. ?Amiodarone lung toxicity though was only on low dose (100 mg daily).  ESR recently checked, elevated but not markedly high at 46.  He is now being followed by Dr. Vaughan Browner. Now off of amiodarone and Trelegy.  Pending repeat high resolution CT and PFTs. - ? Underlying Rheumatoid arthritis, CCP elevated. He has been referred to rheumatology.   Kerby Nora, PharmD, BCPS Heart Failure  Clinic Pharmacist [phone]

## 2021-04-28 NOTE — Progress Notes (Signed)
Brandon [name]    [id number]    1946/05/22  Primary Care Physician:Webb, Arbie Cookey, MD  Referring Physician: Maurice Small, MD [place],  [place] [address]  Chief complaint:   Follow-up for abnormal CT, concern for ILD  HPI: 75 year old ex-smoker with significant cardiac history of  CVA, paroxysmal atrial fibrillation, valvular heart disease, and chronic diastolic CHF.  He had a prolonged hospitalization in 2018 for heart failure, A. fib with RVR.  Eventually underwent aortic and mitral valve replacement, Maze procedure, LAA clipping  He was started on amiodarone around 2018 2019 and continues on at 800 mg a day.  Follows with Dr. Aundra Dubin in the heart failure clinic  Over the past few months he has noted increasing dyspnea on exertion.  He had a work-up done including PFTs and CT scan which showed changes suggestive of interstitial lung disease and has been referred here for further evaluation.  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  No ongoing exposures.  No mold, hot tub, Jacuzzi.  No feather pillows or comforter Smoking history: 30-pack-year smoker.  Quit in 2018 Travel history: No significant travel history Relevant family history: No family history of lung disease  Interim history: Evaluate for CT showing mild nonspecific groundglass opacities.  He was taken off amiodarone due to concern for amiodarone toxicity.  However he appears to be back in atrial fibrillation noted on remote monitoring of pacer device and is due to see cardiology next week.  He had an  ILD panel which showed marked elevation of CCP.  Evaluated by rheumatology in early May 2022 with no findings of rheumatoid arthritis.  Elevated CCP was felt to be incidental  Tried on Trelegy inhaler.  Telephone note  from wife states that it helped with breathing but patient tells me that the inhaler made him cough and he stopped using  Denies any dyspnea, cough, sputum production, fevers.  Outpatient Encounter Medications as of 04/28/2021  Medication Sig  . acetaminophen (TYLENOL) 325 MG tablet Take 1 tablet (325 mg total) by mouth every 6 (six) hours as needed for mild pain.  . calcium carbonate (TUMS - DOSED IN MG ELEMENTAL CALCIUM) 500 MG chewable tablet Chew 1 tablet by mouth daily as needed for indigestion or heartburn.  . carvedilol (COREG) 12.5 MG tablet TAKE 1 TABLET TWICE DAILY WITH MEALS  . cetirizine (ZYRTEC) 10 MG tablet Take 10 mg by mouth daily as needed for allergies.  . citalopram (CELEXA) 20 MG tablet Take 1 tablet (20 mg total) by mouth at bedtime.  . docusate sodium (COLACE) 100 MG capsule Take 1 capsule (100 mg total) by mouth 3 (three) times daily as needed.  . empagliflozin (JARDIANCE) 10 MG TABS tablet TAKE 1 TABLET BY MOUTH ONCE A DAY BEFORE BREAKFAST  . EPINEPHrine (EPIPEN JR) 0.15 MG/0.3ML injection   . furosemide (LASIX) 40 MG tablet Take '40mg'$  in the AM and '20mg'$  in the PM.  . guaiFENesin (MUCINEX) 600 MG 12 hr tablet Take 600 mg by mouth 2 (two) times daily.  . hydrALAZINE (APRESOLINE) 25 MG tablet Take 1 tablet (25 mg total) by mouth 3 (three) times daily.  . methocarbamol (ROBAXIN) 500 MG tablet Take 1 tablet (500 mg total) by mouth every 8 (eight) hours as needed for muscle spasms.  [name]  Pseudoeph-Doxylamine-DM-APAP (NYQUIL PO) Take by mouth as needed.  . rosuvastatin (CRESTOR) 10 MG tablet TAKE 1 TABLET EVERY DAY  . sacubitril-valsartan (ENTRESTO) 49-51 MG TAKE 1 TABLET BY MOUTH 2 TIMES DAILY  . spironolactone (ALDACTONE) 25 MG tablet TAKE 1 TABLET EVERY DAY  . traZODone (DESYREL) 50 MG tablet Take 100 mg by mouth at bedtime.  [name] warfarin (COUMADIN) 5 MG tablet TAKE 1/2 TO 1 TABLET EVERY DAY AS DIRECTED BY THE COUMADIN CLINIC  . pantoprazole (PROTONIX) 40 MG tablet Take 1 tablet  (40 mg total) by mouth daily. (Patient not taking: Reported on 04/28/2021)   No facility-administered encounter medications on file as of 04/28/2021.   Physical Exam: Blood pressure 134/86, pulse (!) 103, temperature 98 F (36.7 C), temperature source Temporal, height '5\' 11"'$  (1.803 m), weight 278 lb (126.1 kg), SpO2 99 %. Gen:      No acute distress HEENT:  EOMI, sclera anicteric Neck:     No masses; no thyromegaly Lungs:    Clear to auscultation bilaterally; normal respiratory effort CV:         Regular rate and rhythm; no murmurs Abd:      + bowel sounds; soft, non-tender; no palpable masses, no distension Ext:    No edema; adequate peripheral perfusion Skin:      Warm and dry; no rash Neuro: alert and oriented x 3 Psych: normal mood and affect  Data Reviewed: Imaging: CT coronary 09/28/17-no interstitial lung disease, mild bibasal atelectasis  High-res CT 01/20/2021-bilateral groundglass attenuation most evident in the mid to lower lungs with mild reticulation, moderate air trapping.  I have reviewed the images personally.  PFTs: 01/07/2021 FVC 2.70 (60%), FEV1 2.19 (16%), F/F 81, TLC 6.03 [82%], DLCO 13.20 [50%] Moderate diffusion defect  Labs: Sed rate 01/29/21-46  CTD serologies 02/24/2021-positive for CCP greater than 250  Assessment:  Evaluation for interstitial lung disease I have reviewed his CT scan which shows groundglass opacities with minimal reticulation.  The appearance of this is nonspecific and there is no clear evidence of interstitial lung disease or pulmonary fibrosis.  The groundglass changes may be from heart failure  CT changes could also be from amiodarone use though the sed rate noted to be low.  Currently off amiodarone. However with recurrence of atrial fibrillation it will be reasonable to resume if needed from a cardiology standpoint.  We will repeat high-res CT and PFTs for evaluation now that he has been off amiodarone for several months  Currently  off trelegy inhaler.  Will not resume as it did not help with his breathing  Plan/Recommendations: - Repeat high-res CT, PFTs  Marshell Garfinkel MD [facility] 04/28/2021, 11:53 AM  CC: Maurice Small, MD

## 2021-04-29 ENCOUNTER — Encounter (HOSPITAL_COMMUNITY): Payer: Self-pay

## 2021-04-29 ENCOUNTER — Ambulatory Visit (HOSPITAL_COMMUNITY)
Admission: RE | Admit: 2021-04-29 | Discharge: 2021-04-29 | Disposition: A | Discharge status: Home / Self Care | Payer: Medicare HMO | Source: Ambulatory Visit | Attending: Cardiology | Admitting: Cardiology

## 2021-04-29 DIAGNOSIS — I5022 Chronic systolic (congestive) heart failure: Secondary | ICD-10-CM

## 2021-04-29 MED ORDER — CARVEDILOL 12.5 MG PO TABS: ORAL_TABLET | ORAL | 3 refills | Status: DC

## 2021-04-29 NOTE — Patient Instructions (Signed)
It was a pleasure seeing you today!  MEDICATIONS: -We are changing your medications today -Increase your carvedilol to 1.5 tablets (18.75 mg) by mouth twice daily. -Call if you have questions about your medications.   NEXT APPOINTMENT: Return to clinic in July with Dr. Aundra Dubin.  In general, to take care of your heart failure: -Limit your fluid intake to 2 Liters (half-gallon) per day.   -Limit your salt intake to ideally 2-3 grams (2000-3000 mg) per day. -Weigh yourself daily and record, and bring that "weight diary" to your next appointment.  (Weight gain of 2-3 pounds in 1 day typically means fluid weight.) -The medications for your heart are to help your heart and help you live longer.   -Please contact us before stopping any of your heart medications.  Call the clinic at [phone] with questions or to reschedule future appointments.

## 2021-04-30 ENCOUNTER — Other Ambulatory Visit (HOSPITAL_COMMUNITY): Payer: Self-pay

## 2021-04-30 MED FILL — Sacubitril-Valsartan Tab 49-51 MG: ORAL | 30 days supply | Qty: 60 | Fill #1 | Status: AC

## 2021-04-30 MED FILL — Empagliflozin Tab 10 MG: ORAL | 30 days supply | Qty: 30 | Fill #0 | Status: AC

## 2021-05-03 ENCOUNTER — Ambulatory Visit (HOSPITAL_COMMUNITY)
Admission: RE | Admit: 2021-05-03 | Discharge: 2021-05-03 | Disposition: A | Discharge status: Home / Self Care | Payer: Medicare HMO | Source: Ambulatory Visit | Attending: Physician Assistant | Admitting: Physician Assistant

## 2021-05-03 ENCOUNTER — Encounter (HOSPITAL_COMMUNITY): Payer: Self-pay | Admitting: Physician Assistant

## 2021-05-03 ENCOUNTER — Other Ambulatory Visit: Payer: Self-pay

## 2021-05-03 VITALS — BP 100/68 | HR 60 | Ht 71.0 in | Wt 277.4 lb

## 2021-05-03 DIAGNOSIS — Z713 Dietary counseling and surveillance: Secondary | ICD-10-CM | POA: Diagnosis not present

## 2021-05-03 DIAGNOSIS — I110 Hypertensive heart disease with heart failure: Secondary | ICD-10-CM | POA: Insufficient documentation

## 2021-05-03 DIAGNOSIS — G4733 Obstructive sleep apnea (adult) (pediatric): Secondary | ICD-10-CM | POA: Insufficient documentation

## 2021-05-03 DIAGNOSIS — Z7901 Long term (current) use of anticoagulants: Secondary | ICD-10-CM | POA: Diagnosis not present

## 2021-05-03 DIAGNOSIS — I2510 Atherosclerotic heart disease of native coronary artery without angina pectoris: Secondary | ICD-10-CM | POA: Diagnosis not present

## 2021-05-03 DIAGNOSIS — E669 Obesity, unspecified: Secondary | ICD-10-CM | POA: Diagnosis not present

## 2021-05-03 DIAGNOSIS — Z79899 Other long term (current) drug therapy: Secondary | ICD-10-CM | POA: Diagnosis not present

## 2021-05-03 DIAGNOSIS — Z6838 Body mass index (BMI) 38.0-38.9, adult: Secondary | ICD-10-CM | POA: Diagnosis not present

## 2021-05-03 DIAGNOSIS — Z87891 Personal history of nicotine dependence: Secondary | ICD-10-CM | POA: Diagnosis not present

## 2021-05-03 DIAGNOSIS — I5022 Chronic systolic (congestive) heart failure: Secondary | ICD-10-CM | POA: Diagnosis not present

## 2021-05-03 DIAGNOSIS — I4819 Other persistent atrial fibrillation: Secondary | ICD-10-CM

## 2021-05-03 DIAGNOSIS — D6869 Other thrombophilia: Secondary | ICD-10-CM | POA: Diagnosis not present

## 2021-05-03 MED ORDER — AMIODARONE HCL 200 MG PO TABS: 200.0000 mg | ORAL_TABLET | Freq: Every day | ORAL | 3 refills | Status: DC

## 2021-05-03 NOTE — Patient Instructions (Signed)
Start Amiodarone '200mg'$  once a day

## 2021-05-03 NOTE — Progress Notes (Signed)
Primary Care Physician: Maurice Small, MD Primary Cardiologist: Dr Aundra Dubin Primary Electrophysiologist: Dr Lovena Le Referring Physician: Dr Berenice Bouton is a 75 y.o. male with a history of persistent atrial fibrillation/atrial flutter s/p MAZE 2018, CAD s/p CABG, HTN, s/p aortic and mitral valve replacements, COPD, OSA who presents for follow up in the [facility]. Patient reports that he feels "OK" but his wife reports that she has noticed more SOB and fatigue when he is active. Remote device transmission shows substantially increased AF burden since March 2020. Recently resumed his amiodarone.   On follow up today, patient reports he has done reasonably well. Amiodarone was stopped due to concern for amio lung toxicity. Pulmonology workup showed no clear evidence for ILRD or pulmonary fibrosis. The device clinic noted an increase in his afib burden and he has had worsening fatigue.   Today, he denies symptoms of palpitations, chest pain, orthopnea, PND, lower extremity edema, dizziness, presyncope, syncope, snoring, daytime somnolence, bleeding, or neurologic sequela. The patient is tolerating medications without difficulties and is otherwise without complaint today.    Atrial Fibrillation Risk Factors:  he does have symptoms or diagnosis of sleep apnea. he is not compliant with CPAP therapy.  he has a BMI of Body mass index is 38.69 kg/m.[name]   Family History  Problem Relation Age of Onset  . Stroke Father        No details  . Angina Mother   . Kidney cancer Brother      Atrial Fibrillation Management history:  Previous antiarrhythmic drugs: amiodarone Previous cardioversions: 04/2018 Previous ablations: MAZE CHADS2VASC score: 5 Anticoagulation history: warfarin   Past Medical History:  Diagnosis Date  . Anemia 07/13/2017  . Anxiety   . Aortic insufficiency   . Aortic stenosis, moderate 07/13/2017  . Arthritis    back   . Carotid  stenosis    Right carotid stent (widely patent) 40 - 59% left plaque 11/13  . CHF (congestive heart failure) ([place])   . Chronic kidney disease   . COPD (chronic obstructive pulmonary disease) ([place])   . Coronary artery disease involving native coronary artery of native heart without angina pectoris   . Depression   . Dyslipidemia   . GERD (gastroesophageal reflux disease)   . Heart murmur   . Hemiplegia affecting unspecified side, late effect of cerebrovascular disease    resolved- from L side   . Hypertension   . Jaundice    resolved following ERCP & Cholecystectomy  . Mild emphysema ([place])   . Mitral regurgitation   . Mitral valve insufficiency and aortic valve insufficiency   . Myocardial infarction ([place])   . Paroxysmal atrial fibrillation ([facility])   . Pre-diabetes    per spouse  . S/P aortic valve replacement with bioprosthetic valve 11/16/2017   23 mm [facility] Ease stented bovine pericardial tissue valve  . S/P CABG x 1 11/16/2017   SVG to PDA with EVH via right thigh  . S/P Maze operation for atrial fibrillation 11/16/2017   Left side lesion set using bipolar radiofrequency and cryothermy ablation with clipping of LA appendage  . S/P mitral valve replacement with bioprosthetic valve 11/16/2017   27 mm [facility] Mitral stented bovine pericardial tissue valve  . Sleep apnea    does not wear CPAP  . Sleep concern    resulted in surgery- after + sleep test. Pt. doesn't have a problem any longer.   . Stroke ([place]) 03/11/2003  stent placed on the 31, 3, 2004, L side   . Wears glasses   . Wears hearing aid in both ears   . Wears partial dentures    Past Surgical History:  Procedure Laterality Date  . AORTIC VALVE REPLACEMENT N/A 11/16/2017   Procedure: AORTIC VALVE REPLACEMENT (AVR) with 76m Magna Ease;  Surgeon: ORexene Alberts MD;  [place]  Service: Open Heart Surgery;  Laterality: N/A;  . ARTERIAL LINE INSERTION Right 11/16/2017   Procedure: ARTERIAL LINE  INSERTION -RIGHT FEMORAL;  Surgeon: ORexene Alberts MD;  [place] [facility]  Service: Open Heart Surgery;  Laterality: Right;  . BACK SURGERY     lumbar back  . CARDIOVERSION N/A 04/11/2018   Procedure: CARDIOVERSION;  Surgeon: MLarey Dresser MD;  [facility]  Service: Cardiovascular;  Laterality: N/A;  . CHOLECYSTECTOMY    . CORONARY ARTERY BYPASS GRAFT N/A 11/16/2017   Procedure: CORONARY ARTERY BYPASS GRAFTING (CABG) x 1;  Surgeon: ORexene Alberts MD;  [place] [facility]  Service: Open Heart Surgery;  Laterality: N/A;  . ENDOVEIN HARVEST OF GREATER SAPHENOUS VEIN Right 11/16/2017   Procedure: ENDOVEIN HARVEST OF GREATER SAPHENOUS VEIN;  Surgeon: ORexene Alberts MD;  [place] [facility] [place]  Service: Open Heart Surgery;  Laterality: Right;  . ERCP N/A 05/31/2013   Procedure: ENDOSCOPIC RETROGRADE CHOLANGIOPANCREATOGRAPHY (ERCP);  Surgeon: MLadene Artist MD;  [facility];  Service: Endoscopy;  Laterality: N/A;  . FOOT SURGERY     right  . IR RADIOLOGY PERIPHERAL GUIDED IV START  09/28/2017  . IR [place] VASC ACCESS RIGHT  09/28/2017  . LAPAROSCOPIC CHOLECYSTECTOMY SINGLE PORT N/A 06/01/2013   Procedure: LAPAROSCOPIC CHOLECYSTECTOMY SINGLE PORT;  Surgeon: SAdin Hector MD;  [facility];  Service: General;  Laterality: N/A;  . MAZE N/A 11/16/2017   Procedure: MAZE;  Surgeon: ORexene Alberts MD;  [facility]  Service: Open Heart Surgery;  Laterality: N/A;  . MITRAL VALVE REPAIR N/A 11/16/2017   Procedure: MITRAL VALVE  REPLACEMENT with 287mMagnaEase;  Surgeon: OwRexene AlbertsMD;  [facility] Service: Open Heart Surgery;  Laterality: N/A;  . PACEMAKER IMPLANT N/A 11/21/2017   Procedure: PACEMAKER IMPLANT;  Surgeon: TaEvans LanceMD;  [facility];  Service: Cardiovascular;  Laterality: N/A;  . POLYPECTOMY    . RIGHT/LEFT HEART CATH AND CORONARY ANGIOGRAPHY N/A 08/30/2017   Procedure: RIGHT/LEFT HEART CATH AND CORONARY ANGIOGRAPHY;   Surgeon: McLarey DresserMD;  [facility];  Service: Cardiovascular;  Laterality: N/A;  . SHOULDER ARTHROSCOPY WITH ROTATOR CUFF REPAIR AND SUBACROMIAL DECOMPRESSION Left 05/18/2017  . SHOULDER ARTHROSCOPY WITH ROTATOR CUFF REPAIR AND SUBACROMIAL DECOMPRESSION Left 05/18/2017   Procedure: SHOULDER ARTHROSCOPY WITH ROTATOR CUFF REPAIR AND SUBACROMIAL DECOMPRESSION;  Surgeon: ChTania AdeMD;  [facility] Service: Orthopedics;  Laterality: Left;  LEFT SHOULDER ARTHROSCOPY WITH ROTATOR CUFF REPAIR AND SUBACROMIAL DECOMPRESSION  . TEE WITHOUT CARDIOVERSION N/A 05/09/2017   Procedure: TRANSESOPHAGEAL ECHOCARDIOGRAM (TEE);  Surgeon: RaSkeet LatchMD;  [facility] Service: Cardiovascular;  Laterality: N/A;  . TEE WITHOUT CARDIOVERSION N/A 08/30/2017   Procedure: TRANSESOPHAGEAL ECHOCARDIOGRAM (TEE);  Surgeon: McLarey DresserMD;  [facility];  Service: Cardiovascular;  Laterality: N/A;  . TEE WITHOUT CARDIOVERSION N/A 11/16/2017   Procedure: TRANSESOPHAGEAL ECHOCARDIOGRAM (TEE);  Surgeon: OwRexene AlbertsMD;  [facility] Service: Open Heart Surgery;  Laterality: N/A;  . TEE WITHOUT  CARDIOVERSION N/A 04/11/2018   Procedure: TRANSESOPHAGEAL ECHOCARDIOGRAM (TEE);  Surgeon: Larey Dresser, MD;  [facility];  Service: Cardiovascular;  Laterality: N/A;  . TONSILLECTOMY      Current Outpatient Medications  Medication Sig Dispense Refill  . acetaminophen (TYLENOL) 325 MG tablet Take 1 tablet (325 mg total) by mouth every 6 (six) hours as needed for mild pain. 30 tablet 0  . calcium carbonate (TUMS - DOSED IN MG ELEMENTAL CALCIUM) 500 MG chewable tablet Chew 1 tablet by mouth daily as needed for indigestion or heartburn.    . carvedilol (COREG) 12.5 MG tablet Take 1.5 tablets (18.75 mg) by mouth twice daily 270 tablet 3  . cetirizine (ZYRTEC) 10 MG tablet Take 10 mg by mouth daily as needed for allergies.    . citalopram (CELEXA) 20 MG  tablet Take 1 tablet (20 mg total) by mouth at bedtime. 30 tablet 5  . docusate sodium (COLACE) 100 MG capsule Take 1 capsule (100 mg total) by mouth 3 (three) times daily as needed. 20 capsule 0  . empagliflozin (JARDIANCE) 10 MG TABS tablet TAKE 1 TABLET BY MOUTH ONCE A DAY BEFORE BREAKFAST 90 tablet 3  . EPINEPHrine (EPIPEN JR) 0.15 MG/0.3ML injection     . furosemide (LASIX) 40 MG tablet Take '40mg'$  in the AM and '20mg'$  in the PM. 30 tablet 0  . guaiFENesin (MUCINEX) 600 MG 12 hr tablet Take 600 mg by mouth 2 (two) times daily.    . methocarbamol (ROBAXIN) 500 MG tablet Take 1 tablet (500 mg total) by mouth every 8 (eight) hours as needed for muscle spasms. 30 tablet 0  . pantoprazole (PROTONIX) 40 MG tablet Take 1 tablet (40 mg total) by mouth daily. 30 tablet 1  . Pseudoeph-Doxylamine-DM-APAP (NYQUIL PO) Take by mouth as needed.    . rosuvastatin (CRESTOR) 10 MG tablet TAKE 1 TABLET EVERY DAY 90 tablet 3  . sacubitril-valsartan (ENTRESTO) 49-51 MG TAKE 1 TABLET BY MOUTH 2 TIMES DAILY 60 tablet 6  . spironolactone (ALDACTONE) 25 MG tablet TAKE 1 TABLET EVERY DAY 90 tablet 3  . traZODone (DESYREL) 50 MG tablet Take 100 mg by mouth at bedtime.    [name] warfarin (COUMADIN) 5 MG tablet TAKE 1/2 TO 1 TABLET EVERY DAY AS DIRECTED BY THE COUMADIN CLINIC 90 tablet 0   No current facility-administered medications for this encounter.    Allergies  Allergen Reactions  . Bee Venom Anaphylaxis and Swelling    Social History   Socioeconomic History  . Marital status: Married    Spouse name: Not on file  . Number of children: 3  . Years of education: Not on file  . Highest education level: Not on file  Occupational History    Employer: RETIRED  Tobacco Use  . Smoking status: Former Smoker    Packs/day: 0.50    Years: 57.00    Pack years: 28.50    Types: Cigarettes    Quit date: 2018    Years since quitting: 4.3  . Smokeless tobacco: Never Used  Vaping Use  . Vaping Use: Former  . Quit date:  07/13/2017  Substance and Sexual Activity  . Alcohol use: Yes    Alcohol/week: 3.0 - 5.0 standard drinks    Types: 3 - 5 Glasses of wine per week    Comment: moderate wine ; not since 8/ 2018  . Drug use: No  . Sexual activity: Yes  Other Topics Concern  . Not on file  Social History Narrative  Two living children.  Lives with wife.     Social Determinants of Health   Financial Resource Strain: Not on file  Food Insecurity: Not on file  Transportation Needs: Not on file  Physical Activity: Not on file  Stress: Not on file  Social Connections: Not on file  Intimate Partner Violence: Not on file     ROS- All systems are reviewed and negative except as per the HPI above.  Physical Exam: Vitals:   05/03/21 1325  BP: 100/68  Pulse: 60  Weight: 125.8 kg  Height: '5\' 11"'$  (1.803 m)    GEN- The patient is a well appearing obese elderly male, alert and oriented x 3 today.   HEENT-head normocephalic, atraumatic, sclera clear, conjunctiva pink, hearing intact, trachea midline. Lungs- Clear to ausculation bilaterally, normal work of breathing Heart- Regular rate and rhythm, no murmurs, rubs or gallops  GI- soft, NT, ND, + BS Extremities- no clubbing, cyanosis, or edema MS- no significant deformity or atrophy Skin- no rash or lesion Psych- euthymic mood, full affect Neuro- strength and sensation are intact   Wt Readings from Last 3 Encounters:  05/03/21 125.8 kg  04/29/21 125.6 kg  04/28/21 126.1 kg    EKG today demonstrates  A paced rhythm, LBBB Vent. rate 60 BPM PR interval 294 ms QRS duration 144 ms QT/QTcB 470/470 ms  Echo 10/29/20 demonstrated 1. Left ventricular ejection fraction, by estimation, is 50 to 55%. The  left ventricle has low normal function. The left ventricle demonstrates  global hypokinesis. There is mild left ventricular hypertrophy. Left  ventricular diastolic parameters are  indeterminate.  2. Right ventricular systolic function is normal.  The right ventricular  size is mildly enlarged. There is normal pulmonary artery systolic  pressure. The estimated right ventricular systolic pressure is 0000000 mmHg.  3. Left atrial size was mildly dilated.  4. Bioprosthetic mitral valve with no significant regurgitation and mean  gradient 5 mmHg. Valve is functioning normally.  5. Bioprosthetic aortic valve with mean gradient 12 mmHg and no  significant regurgitation, the valve functions normally.  6. Aortic dilatation noted. There is mild dilatation of the ascending  aorta, measuring 39 mm.  7. The inferior vena cava is normal in size with greater than 50%  respiratory variability, suggesting right atrial pressure of 3 mmHg.   Epic records are reviewed at length today  Assessment and Plan:  1. Persistent atrial fibrillation/atrial flutter Patient's afib burden increased on his device. He is in SR today. We discussed therapeutic options today. Historically, he tolerates afib poorly and likely needs an AAD. In d/w his pulmonologist, there is low suspicion for amiodarone lung toxicity.  Will restart amiodarone 200 mg daily.  Continue warfarin  Recent lab work reviewed.  Continue Coreg 12.5 mg BID  This patients CHA2DS2-VASc Score and unadjusted Ischemic Stroke Rate (% per year) is equal to 7.2 % stroke rate/year from a score of 5  Above score calculated as 1 point each if present [CHF, HTN, DM, Vascular=MI/PAD/Aortic Plaque, Age if 65-74, or Male] Above score calculated as 2 points each if present [Age > 75, or Stroke/TIA/TE]  2. Obesity Body mass index is 38.69 kg/m. Lifestyle modification was discussed and encouraged including regular physical activity and weight reduction.  3. HTN Stable, no changes today.  4. PPM Followed by Dr Lovena Le and the device clinic.  5. Chronic systolic CHF No signs or symptoms of fluid overload. Followed in the [facility].  6. CAD No anginal symptoms.  7. OSA  Not currently on  CPAP.   Follow up in the AF clinic in 2-3 weeks.    [facility] [address] [place], [place] [address] [phone] 05/03/2021 1:29 PM

## 2021-05-08 ENCOUNTER — Ambulatory Visit
Admission: RE | Admit: 2021-05-08 | Discharge: 2021-05-08 | Disposition: A | Discharge status: Home / Self Care | Payer: Medicare HMO | Source: Ambulatory Visit | Attending: Pulmonary Disease | Admitting: Pulmonary Disease

## 2021-05-08 ENCOUNTER — Other Ambulatory Visit: Payer: Self-pay

## 2021-05-08 DIAGNOSIS — R0600 Dyspnea, unspecified: Secondary | ICD-10-CM

## 2021-05-08 DIAGNOSIS — I2510 Atherosclerotic heart disease of native coronary artery without angina pectoris: Secondary | ICD-10-CM | POA: Diagnosis not present

## 2021-05-08 DIAGNOSIS — J929 Pleural plaque without asbestos: Secondary | ICD-10-CM | POA: Diagnosis not present

## 2021-05-08 DIAGNOSIS — J849 Interstitial pulmonary disease, unspecified: Secondary | ICD-10-CM | POA: Diagnosis not present

## 2021-05-11 ENCOUNTER — Telehealth: Payer: Self-pay | Admitting: Pharmacist

## 2021-05-11 DIAGNOSIS — I5022 Chronic systolic (congestive) heart failure: Secondary | ICD-10-CM

## 2021-05-11 MED ORDER — CARVEDILOL 12.5 MG PO TABS: ORAL_TABLET | ORAL | 3 refills | Status: DC

## 2021-05-11 NOTE — Telephone Encounter (Signed)
Patient's wife called to alert coumadin clinic patient was placed back on amiodarone.  Moved INR appointment up 1 week

## 2021-05-18 ENCOUNTER — Other Ambulatory Visit: Payer: Self-pay

## 2021-05-18 ENCOUNTER — Ambulatory Visit (INDEPENDENT_AMBULATORY_CARE_PROVIDER_SITE_OTHER): Payer: Medicare HMO | Admitting: *Deleted

## 2021-05-18 DIAGNOSIS — Z5181 Encounter for therapeutic drug level monitoring: Secondary | ICD-10-CM | POA: Diagnosis not present

## 2021-05-18 DIAGNOSIS — Z953 Presence of xenogenic heart valve: Secondary | ICD-10-CM | POA: Diagnosis not present

## 2021-05-18 LAB — POCT INR: INR: 5 — AB (ref 2.0–3.0)

## 2021-05-18 NOTE — Patient Instructions (Signed)
Description   Hold warfarin today and tomorrow, then START taking warfarin 1/2 a tablets daily except for 1 tablet on Fridays. Recheck INR in 1.5 weeks. Amio restarted on 5/23. Call with any new medications, scheduled procedures or any questions  [phone]

## 2021-05-19 ENCOUNTER — Ambulatory Visit (HOSPITAL_COMMUNITY)
Admission: RE | Admit: 2021-05-19 | Discharge: 2021-05-19 | Disposition: A | Discharge status: Home / Self Care | Payer: Medicare HMO | Source: Ambulatory Visit | Attending: Physician Assistant | Admitting: Physician Assistant

## 2021-05-19 VITALS — BP 104/70 | HR 104 | Ht 71.0 in | Wt 274.4 lb

## 2021-05-19 DIAGNOSIS — G4733 Obstructive sleep apnea (adult) (pediatric): Secondary | ICD-10-CM | POA: Diagnosis not present

## 2021-05-19 DIAGNOSIS — Z87891 Personal history of nicotine dependence: Secondary | ICD-10-CM | POA: Diagnosis not present

## 2021-05-19 DIAGNOSIS — D6869 Other thrombophilia: Secondary | ICD-10-CM

## 2021-05-19 DIAGNOSIS — I110 Hypertensive heart disease with heart failure: Secondary | ICD-10-CM | POA: Insufficient documentation

## 2021-05-19 DIAGNOSIS — I484 Atypical atrial flutter: Secondary | ICD-10-CM | POA: Diagnosis not present

## 2021-05-19 DIAGNOSIS — I4819 Other persistent atrial fibrillation: Secondary | ICD-10-CM | POA: Insufficient documentation

## 2021-05-19 DIAGNOSIS — Z7984 Long term (current) use of oral hypoglycemic drugs: Secondary | ICD-10-CM | POA: Diagnosis not present

## 2021-05-19 DIAGNOSIS — I5022 Chronic systolic (congestive) heart failure: Secondary | ICD-10-CM | POA: Insufficient documentation

## 2021-05-19 DIAGNOSIS — I4892 Unspecified atrial flutter: Secondary | ICD-10-CM | POA: Diagnosis not present

## 2021-05-19 DIAGNOSIS — Z7901 Long term (current) use of anticoagulants: Secondary | ICD-10-CM | POA: Diagnosis not present

## 2021-05-19 DIAGNOSIS — Z6838 Body mass index (BMI) 38.0-38.9, adult: Secondary | ICD-10-CM | POA: Insufficient documentation

## 2021-05-19 DIAGNOSIS — I2510 Atherosclerotic heart disease of native coronary artery without angina pectoris: Secondary | ICD-10-CM | POA: Diagnosis not present

## 2021-05-19 DIAGNOSIS — Z951 Presence of aortocoronary bypass graft: Secondary | ICD-10-CM | POA: Diagnosis not present

## 2021-05-19 DIAGNOSIS — E669 Obesity, unspecified: Secondary | ICD-10-CM | POA: Diagnosis not present

## 2021-05-19 DIAGNOSIS — Z79899 Other long term (current) drug therapy: Secondary | ICD-10-CM | POA: Insufficient documentation

## 2021-05-19 NOTE — Progress Notes (Signed)
Primary Care Physician: Maurice Small, MD Primary Cardiologist: Dr Aundra Dubin Primary Electrophysiologist: Dr Lovena Le Referring Physician: Dr Berenice Bouton is a 75 y.o. male with a history of persistent atrial fibrillation/atrial flutter s/p MAZE 2018, CAD s/p CABG, HTN, s/p aortic and mitral valve replacements, COPD, OSA who presents for follow up in the [facility]. Patient reports that he feels "OK" but his wife reports that she has noticed more SOB and fatigue when he is active. Remote device transmission shows substantially increased AF burden since March 2020. Recently resumed his amiodarone. Amiodarone was stopped due to concern for amio lung toxicity. Pulmonology workup showed no clear evidence for ILRD or pulmonary fibrosis and amiodarone was resumed due to increasing afib burden.  On follow up today, patient reports that he feels he has more energy since his last visit. Suprisingly, he is in atrial flutter today but unaware. He denies any symptoms of fluid retention. His INR was 5.0 6/7 and his warfarin is being adjusted.   Today, he denies symptoms of palpitations, chest pain, orthopnea, PND, lower extremity edema, dizziness, presyncope, syncope, bleeding, or neurologic sequela. The patient is tolerating medications without difficulties and is otherwise without complaint today.    Atrial Fibrillation Risk Factors:  he does have symptoms or diagnosis of sleep apnea. he is not compliant with CPAP therapy.  he has a BMI of Body mass index is 38.27 kg/m.[name]   Family History  Problem Relation Age of Onset  . Stroke Father        No details  . Angina Mother   . Kidney cancer Brother      Atrial Fibrillation Management history:  Previous antiarrhythmic drugs: amiodarone Previous cardioversions: 04/2018 Previous ablations: MAZE CHADS2VASC score: 5 Anticoagulation history: warfarin   Past Medical History:  Diagnosis Date  . Anemia 07/13/2017   . Anxiety   . Aortic insufficiency   . Aortic stenosis, moderate 07/13/2017  . Arthritis    back   . Carotid stenosis    Right carotid stent (widely patent) 40 - 59% left plaque 11/13  . CHF (congestive heart failure) ([place])   . Chronic kidney disease   . COPD (chronic obstructive pulmonary disease) ([place])   . Coronary artery disease involving native coronary artery of native heart without angina pectoris   . Depression   . Dyslipidemia   . GERD (gastroesophageal reflux disease)   . Heart murmur   . Hemiplegia affecting unspecified side, late effect of cerebrovascular disease    resolved- from L side   . Hypertension   . Jaundice    resolved following ERCP & Cholecystectomy  . Mild emphysema ([place])   . Mitral regurgitation   . Mitral valve insufficiency and aortic valve insufficiency   . Myocardial infarction ([place])   . Paroxysmal atrial fibrillation (H[facility])   . Pre-diabetes    per spouse  . S/P aortic valve replacement with bioprosthetic valve 11/16/2017   23 mm [facility] Ease stented bovine pericardial tissue valve  . S/P CABG x 1 11/16/2017   SVG to PDA with EVH via right thigh  . S/P Maze operation for atrial fibrillation 11/16/2017   Left side lesion set using bipolar radiofrequency and cryothermy ablation with clipping of LA appendage  . S/P mitral valve replacement with bioprosthetic valve 11/16/2017   27 mm [facility] Mitral stented bovine pericardial tissue valve  . Sleep apnea    does not wear CPAP  . Sleep concern  resulted in surgery- after + sleep test. Pt. doesn't have a problem any longer.   . Stroke ([place]) 03/11/2003   stent placed on the 31, 3, 2004, L side   . Wears glasses   . Wears hearing aid in both ears   . Wears partial dentures    Past Surgical History:  Procedure Laterality Date  . AORTIC VALVE REPLACEMENT N/A 11/16/2017   Procedure: AORTIC VALVE REPLACEMENT (AVR) with 4m Magna Ease;  Surgeon: ORexene Alberts MD;  [place]  Service:  Open Heart Surgery;  Laterality: N/A;  . ARTERIAL LINE INSERTION Right 11/16/2017   Procedure: ARTERIAL LINE INSERTION -RIGHT FEMORAL;  Surgeon: ORexene Alberts MD;  [place]  Service: Open Heart Surgery;  Laterality: Right;  . BACK SURGERY     lumbar back  . CARDIOVERSION N/A 04/11/2018   Procedure: CARDIOVERSION;  Surgeon: MLarey Dresser MD;  [facility];  Service: Cardiovascular;  Laterality: N/A;  . CHOLECYSTECTOMY    . CORONARY ARTERY BYPASS GRAFT N/A 11/16/2017   Procedure: CORONARY ARTERY BYPASS GRAFTING (CABG) x 1;  Surgeon: ORexene Alberts MD;  [place]  Service: Open Heart Surgery;  Laterality: N/A;  . ENDOVEIN HARVEST OF GREATER SAPHENOUS VEIN Right 11/16/2017   Procedure: ENDOVEIN HARVEST OF GREATER SAPHENOUS VEIN;  Surgeon: ORexene Alberts MD;  [place]  Service: Open Heart Surgery;  Laterality: Right;  . ERCP N/A 05/31/2013   Procedure: ENDOSCOPIC RETROGRADE CHOLANGIOPANCREATOGRAPHY (ERCP);  Surgeon: MLadene Artist MD;  [facility];  Service: Endoscopy;  Laterality: N/A;  . FOOT SURGERY     right  . IR RADIOLOGY PERIPHERAL GUIDED IV START  09/28/2017  . IR [place] VASC ACCESS RIGHT  09/28/2017  . LAPAROSCOPIC CHOLECYSTECTOMY SINGLE PORT N/A 06/01/2013   Procedure: LAPAROSCOPIC CHOLECYSTECTOMY SINGLE PORT;  Surgeon: SAdin Hector MD;  [facility];  Service: General;  Laterality: N/A;  . MAZE N/A 11/16/2017   Procedure: MAZE;  Surgeon: ORexene Alberts MD;  [place]  Service: Open Heart Surgery;  Laterality: N/A;  . MITRAL VALVE REPAIR N/A 11/16/2017   Procedure: MITRAL VALVE  REPLACEMENT with 278mMagnaEase;  Surgeon: OwRexene AlbertsMD;  [place] [facility] Service: Open Heart Surgery;  Laterality: N/A;  . PACEMAKER IMPLANT N/A 11/21/2017   Procedure: PACEMAKER IMPLANT;  Surgeon: TaEvans LanceMD;  [facility];  Service: Cardiovascular;  Laterality: N/A;  . POLYPECTOMY    . RIGHT/LEFT HEART  CATH AND CORONARY ANGIOGRAPHY N/A 08/30/2017   Procedure: RIGHT/LEFT HEART CATH AND CORONARY ANGIOGRAPHY;  Surgeon: McLarey DresserMD;  [facility];  Service: Cardiovascular;  Laterality: N/A;  . SHOULDER ARTHROSCOPY WITH ROTATOR CUFF REPAIR AND SUBACROMIAL DECOMPRESSION Left 05/18/2017  . SHOULDER ARTHROSCOPY WITH ROTATOR CUFF REPAIR AND SUBACROMIAL DECOMPRESSION Left 05/18/2017   Procedure: SHOULDER ARTHROSCOPY WITH ROTATOR CUFF REPAIR AND SUBACROMIAL DECOMPRESSION;  Surgeon: ChTania AdeMD;  [place] [facility] Service: Orthopedics;  Laterality: Left;  LEFT SHOULDER ARTHROSCOPY WITH ROTATOR CUFF REPAIR AND SUBACROMIAL DECOMPRESSION  . TEE WITHOUT CARDIOVERSION N/A 05/09/2017   Procedure: TRANSESOPHAGEAL ECHOCARDIOGRAM (TEE);  Surgeon: RaSkeet LatchMD;  [place] [facility] Service: Cardiovascular;  Laterality: N/A;  . TEE WITHOUT CARDIOVERSION N/A 08/30/2017   Procedure: TRANSESOPHAGEAL ECHOCARDIOGRAM (TEE);  Surgeon: McLarey DresserMD;  [facility];  Service: Cardiovascular;  Laterality: N/A;  . TEE WITHOUT CARDIOVERSION N/A 11/16/2017   Procedure: TRANSESOPHAGEAL ECHOCARDIOGRAM (TEE);  Surgeon: Rexene Alberts, MD;  Location: [place];  Service: Open Heart Surgery;  Laterality: N/A;  . TEE WITHOUT CARDIOVERSION N/A 04/11/2018   Procedure: TRANSESOPHAGEAL ECHOCARDIOGRAM (TEE);  Surgeon: Larey Dresser, MD;  Location: [facility] END[facility]COPY;  Service: Cardiovascular;  Laterality: N/A;  . TONSILLECTOMY      Current Outpatient Medications  Medication Sig Dispense Refill  . acetaminophen (TYLENOL) 325 MG tablet Take 1 tablet (325 mg total) by mouth every 6 (six) hours as needed for mild pain. 30 tablet 0  . amiodarone (PACERONE) 200 MG tablet Take 1 tablet (200 mg total) by mouth daily. 30 tablet 3  . calcium carbonate (TUMS - DOSED IN MG ELEMENTAL CALCIUM) 500 MG chewable tablet Chew 1 tablet by mouth daily as needed for indigestion or heartburn.    . carvedilol  (COREG) 12.5 MG tablet Take 1.5 tablets (18.75 mg) by mouth twice daily (Patient takes 1 tablet three times daily) 270 tablet 3  . cetirizine (ZYRTEC) 10 MG tablet Take 10 mg by mouth daily as needed for allergies.    . citalopram (CELEXA) 20 MG tablet Take 1 tablet (20 mg total) by mouth at bedtime. 30 tablet 5  . docusate sodium (COLACE) 100 MG capsule Take 1 capsule (100 mg total) by mouth 3 (three) times daily as needed. 20 capsule 0  . empagliflozin (JARDIANCE) 10 MG TABS tablet TAKE 1 TABLET BY MOUTH ONCE A DAY BEFORE BREAKFAST 90 tablet 3  . EPINEPHrine (EPIPEN JR) 0.15 MG/0.3ML injection     . furosemide (LASIX) 40 MG tablet Take '40mg'$  in the AM and '20mg'$  in the PM. 30 tablet 0  . guaiFENesin (MUCINEX) 600 MG 12 hr tablet Take 600 mg by mouth 2 (two) times daily.    . methocarbamol (ROBAXIN) 500 MG tablet Take 1 tablet (500 mg total) by mouth every 8 (eight) hours as needed for muscle spasms. 30 tablet 0  . pantoprazole (PROTONIX) 40 MG tablet Take 1 tablet (40 mg total) by mouth daily. 30 tablet 1  . Pseudoeph-Doxylamine-DM-APAP (NYQUIL PO) Take by mouth as needed.    . rosuvastatin (CRESTOR) 10 MG tablet TAKE 1 TABLET EVERY DAY 90 tablet 3  . sacubitril-valsartan (ENTRESTO) 49-51 MG TAKE 1 TABLET BY MOUTH 2 TIMES DAILY 60 tablet 6  . spironolactone (ALDACTONE) 25 MG tablet TAKE 1 TABLET EVERY DAY 90 tablet 3  . traZODone (DESYREL) 50 MG tablet Take 100 mg by mouth at bedtime.    [name] warfarin (COUMADIN) 5 MG tablet TAKE 1/2 TO 1 TABLET EVERY DAY AS DIRECTED BY THE COUMADIN CLINIC 90 tablet 0   No current facility-administered medications for this encounter.    Allergies  Allergen Reactions  . Bee Venom Anaphylaxis and Swelling    Social History   Socioeconomic History  . Marital status: Married    Spouse name: Not on file  . Number of children: 3  . Years of education: Not on file  . Highest education level: Not on file  Occupational History    Employer: RETIRED  Tobacco Use   . Smoking status: Former Smoker    Packs/day: 0.50    Years: 57.00    Pack years: 28.50    Types: Cigarettes    Quit date: 2018    Years since quitting: 4.4  . Smokeless tobacco: Never Used  Vaping Use  . Vaping Use: Former  . Quit date: 07/13/2017  Substance and Sexual Activity  . Alcohol use: Yes    Alcohol/week: 3.0 - 5.0 standard  drinks    Types: 3 - 5 Glasses of wine per week    Comment: moderate wine ; not since 8/ 2018  . Drug use: No  . Sexual activity: Yes  Other Topics Concern  . Not on file  Social History Narrative   Two living children.  Lives with wife.     Social Determinants of Health   Financial Resource Strain: Not on file  Food Insecurity: Not on file  Transportation Needs: Not on file  Physical Activity: Not on file  Stress: Not on file  Social Connections: Not on file  Intimate Partner Violence: Not on file     ROS- All systems are reviewed and negative except as per the HPI above.  Physical Exam: Vitals:   05/19/21 1320  BP: 104/70  Pulse: (!) 104  Weight: 124.5 kg  Height: '5\' 11"'$  (1.803 m)    GEN- The patient is a well appearing obese elderly male, alert and oriented x 3 today.   HEENT-head normocephalic, atraumatic, sclera clear, conjunctiva pink, hearing intact, trachea midline. Lungs- Clear to ausculation bilaterally, normal work of breathing Heart- irregular rate and rhythm, no murmurs, rubs or gallops  GI- soft, NT, ND, + BS Extremities- no clubbing, cyanosis, or edema MS- no significant deformity or atrophy Skin- no rash or lesion Psych- euthymic mood, full affect Neuro- strength and sensation are intact   Wt Readings from Last 3 Encounters:  05/19/21 124.5 kg  05/03/21 125.8 kg  04/29/21 125.6 kg    EKG today demonstrates  Atypical atrial flutter with variable block, LBBB Vent. rate 104 BPM PR interval * ms QRS duration 146 ms QT/QTcB 386/507 ms  Echo 10/29/20 demonstrated 1. Left ventricular ejection fraction, by  estimation, is 50 to 55%. The  left ventricle has low normal function. The left ventricle demonstrates  global hypokinesis. There is mild left ventricular hypertrophy. Left  ventricular diastolic parameters are  indeterminate.  2. Right ventricular systolic function is normal. The right ventricular  size is mildly enlarged. There is normal pulmonary artery systolic  pressure. The estimated right ventricular systolic pressure is 0000000 mmHg.  3. Left atrial size was mildly dilated.  4. Bioprosthetic mitral valve with no significant regurgitation and mean  gradient 5 mmHg. Valve is functioning normally.  5. Bioprosthetic aortic valve with mean gradient 12 mmHg and no  significant regurgitation, the valve functions normally.  6. Aortic dilatation noted. There is mild dilatation of the ascending  aorta, measuring 39 mm.  7. The inferior vena cava is normal in size with greater than 50%  respiratory variability, suggesting right atrial pressure of 3 mmHg.   Epic records are reviewed at length today  Assessment and Plan:  1. Persistent atrial fibrillation/atrial flutter Patient in asymptomatic atrial flutter today. Will not increase amiodarone right now given elevated INR. Hopefully, his afib and flutter burden will continue to decrease as he loads on amiodarone. Will evaluate burden on remote device transmission 6/23. If persistent, may need DCCV.  Continue amiodarone 200 mg daily Continue warfarin  Continue Coreg 12.5 mg TID (has trouble cutting pill)  This patients CHA2DS2-VASc Score and unadjusted Ischemic Stroke Rate (% per year) is equal to 7.2 % stroke rate/year from a score of 5  Above score calculated as 1 point each if present [CHF, HTN, DM, Vascular=MI/PAD/Aortic Plaque, Age if 65-74, or Male] Above score calculated as 2 points each if present [Age > 75, or Stroke/TIA/TE]  2. Obesity Body mass index is 38.27 kg/m. Lifestyle modification  was discussed and encouraged  including regular physical activity and weight reduction.  3. HTN Stable, no changes today.  4. PPM Followed by Dr Lovena Le and the device clinic.  5. Chronic systolic CHF Followed in the [facility]. No signs or symptoms of fluid overload.  6. CAD No anginal symptoms.  7. OSA Not on CPAP   Follow up with Dr Aundra Dubin as scheduled and AF clinic in 6 months.    [facility] [address] [place], [place] [address] [phone] 05/19/2021 2:08 PM

## 2021-05-20 ENCOUNTER — Telehealth: Payer: Self-pay | Admitting: Pulmonary Disease

## 2021-05-20 NOTE — Telephone Encounter (Signed)
Ok to get the booster. It will not affect the PFTs

## 2021-05-20 NOTE — Telephone Encounter (Signed)
Called and spoke with Patient's wife, Katharine Look per [place] and went over Dr Matilde Bash response/recommendation. All questions answered and Katharine Look expressed full understanding. Nothing further needed at this time.

## 2021-05-20 NOTE — Telephone Encounter (Signed)
Called and spoke with patient wife, Katharine Look per [place], who would like to know if patient's PFT will be affected by Covid 19 booster. PFT scheduled 06/10/2021. Patient wife wanted to make sure before going to get the 2nd booster as to if they should wait to get it until after doing the PFT. Katharine Look stated if she does not answer phone when call back that it is ok to leave a detailed message as to Dr Matilde Bash response.   Dr Vaughan Browner please advise.

## 2021-05-24 ENCOUNTER — Other Ambulatory Visit (HOSPITAL_COMMUNITY): Payer: Self-pay

## 2021-05-24 MED ORDER — SACUBITRIL-VALSARTAN 49-51 MG PO TABS: 1.0000 | ORAL_TABLET | Freq: Two times a day (BID) | ORAL | 11 refills | Status: DC

## 2021-05-24 MED ORDER — EMPAGLIFLOZIN 10 MG PO TABS: ORAL_TABLET | ORAL | 11 refills | Status: DC

## 2021-05-26 ENCOUNTER — Telehealth (HOSPITAL_COMMUNITY): Payer: Self-pay | Admitting: Pharmacy Technician

## 2021-05-26 NOTE — Telephone Encounter (Signed)
Advanced Heart Failure Patient Advocate Encounter  The patient was denied Jardiance assistance due to being over the income. The PAN HF fund grant has opened. Was able to secure a $1000 grant for the patient.  Patient Name - Brandon [name] DOB - 05/17/1946 Member ID - [id number] Disease Fund - Heart Failure 2nd grant amount - $1,000 Total remaining balance - $1,000 Eligibility End Date - 08/19/2021 Claims Submission End Date - 10/18/2021

## 2021-05-31 ENCOUNTER — Ambulatory Visit (INDEPENDENT_AMBULATORY_CARE_PROVIDER_SITE_OTHER): Payer: Medicare HMO

## 2021-05-31 ENCOUNTER — Other Ambulatory Visit: Payer: Self-pay

## 2021-05-31 DIAGNOSIS — Z5181 Encounter for therapeutic drug level monitoring: Secondary | ICD-10-CM | POA: Diagnosis not present

## 2021-05-31 DIAGNOSIS — Z9889 Other specified postprocedural states: Secondary | ICD-10-CM

## 2021-05-31 DIAGNOSIS — I480 Paroxysmal atrial fibrillation: Secondary | ICD-10-CM | POA: Diagnosis not present

## 2021-05-31 DIAGNOSIS — Z8679 Personal history of other diseases of the circulatory system: Secondary | ICD-10-CM | POA: Diagnosis not present

## 2021-05-31 DIAGNOSIS — Z953 Presence of xenogenic heart valve: Secondary | ICD-10-CM

## 2021-05-31 DIAGNOSIS — Z951 Presence of aortocoronary bypass graft: Secondary | ICD-10-CM

## 2021-05-31 LAB — POCT INR: INR: 5.9 — AB (ref 2.0–3.0)

## 2021-05-31 MED ORDER — WARFARIN SODIUM 2.5 MG PO TABS: ORAL_TABLET | ORAL | 1 refills | Status: DC

## 2021-05-31 NOTE — Patient Instructions (Signed)
Description   Hold warfarin for the next 3 days, then START taking warfarin 1/2 a tablets daily.  Recheck INR in 1 week. Amio restarted on 5/23. Call with any new medications, scheduled procedures or any questions  [phone].  New prescription sent to [facility] mail order for 2.'5mg'$  tablets.

## 2021-06-03 ENCOUNTER — Ambulatory Visit (INDEPENDENT_AMBULATORY_CARE_PROVIDER_SITE_OTHER): Payer: Medicare HMO

## 2021-06-03 ENCOUNTER — Telehealth: Payer: Self-pay | Admitting: Emergency Medicine

## 2021-06-03 DIAGNOSIS — I442 Atrioventricular block, complete: Secondary | ICD-10-CM | POA: Diagnosis not present

## 2021-06-03 LAB — CUP PACEART REMOTE DEVICE CHECK
Battery Remaining Longevity: 121 mo
Battery Voltage: 3.02 V
Brady Statistic AP VP Percent: 0.1 %
Brady Statistic AP VS Percent: 76.43 %
Brady Statistic AS VP Percent: 0.07 %
Brady Statistic AS VS Percent: 23.55 %
Brady Statistic RA Percent Paced: 29.37 %
Brady Statistic RV Percent Paced: 6.09 %
Date Time Interrogation Session: 20220622221623
Implantable Lead Implant Date: 20181211
Implantable Lead Implant Date: 20181211
Implantable Lead Location: 753859
Implantable Lead Location: 753860
Implantable Lead Model: 3830
Implantable Lead Model: 5076
Implantable Pulse Generator Implant Date: 20181211
Lead Channel Impedance Value: 285 Ohm
Lead Channel Impedance Value: 342 Ohm
Lead Channel Impedance Value: 380 Ohm
Lead Channel Impedance Value: 399 Ohm
Lead Channel Pacing Threshold Amplitude: 0.625 V
Lead Channel Pacing Threshold Amplitude: 1.125 V
Lead Channel Pacing Threshold Pulse Width: 0.4 ms
Lead Channel Pacing Threshold Pulse Width: 0.4 ms
Lead Channel Sensing Intrinsic Amplitude: 4.625 mV
Lead Channel Sensing Intrinsic Amplitude: 4.625 mV
Lead Channel Sensing Intrinsic Amplitude: 4.625 mV
Lead Channel Sensing Intrinsic Amplitude: 4.625 mV
Lead Channel Setting Pacing Amplitude: 2 V
Lead Channel Setting Pacing Amplitude: 2 V
Lead Channel Setting Pacing Pulse Width: 0.4 ms
Lead Channel Setting Sensing Sensitivity: 1.2 mV

## 2021-06-03 NOTE — Telephone Encounter (Signed)
  Carelink received for ongoing A-Fib. Called to assess patient . Left message to contact the [facility].

## 2021-06-04 ENCOUNTER — Other Ambulatory Visit (HOSPITAL_COMMUNITY): Payer: Self-pay | Admitting: Cardiology

## 2021-06-07 ENCOUNTER — Other Ambulatory Visit (HOSPITAL_COMMUNITY)
Admission: RE | Admit: 2021-06-07 | Discharge: 2021-06-07 | Disposition: A | Discharge status: Home / Self Care | Payer: Medicare HMO | Source: Ambulatory Visit | Attending: Pulmonary Disease | Admitting: Pulmonary Disease

## 2021-06-07 DIAGNOSIS — Z20822 Contact with and (suspected) exposure to covid-19: Secondary | ICD-10-CM | POA: Insufficient documentation

## 2021-06-07 DIAGNOSIS — Z01812 Encounter for preprocedural laboratory examination: Secondary | ICD-10-CM | POA: Diagnosis not present

## 2021-06-08 ENCOUNTER — Ambulatory Visit (INDEPENDENT_AMBULATORY_CARE_PROVIDER_SITE_OTHER): Payer: Medicare HMO | Admitting: *Deleted

## 2021-06-08 ENCOUNTER — Other Ambulatory Visit: Payer: Self-pay

## 2021-06-08 DIAGNOSIS — Z5181 Encounter for therapeutic drug level monitoring: Secondary | ICD-10-CM

## 2021-06-08 DIAGNOSIS — Z953 Presence of xenogenic heart valve: Secondary | ICD-10-CM

## 2021-06-08 LAB — SARS CORONAVIRUS 2 (TAT 6-24 HRS): SARS Coronavirus 2: NEGATIVE

## 2021-06-08 LAB — POCT INR: INR: 1.7 — AB (ref 2.0–3.0)

## 2021-06-08 NOTE — Patient Instructions (Signed)
Description    Pt now using warfarin 2.'5mg'$  tablets. Take 1.5 tablets today (3.75 mg) then continue taking Warfarin 1 tablet (2.'5mg'$  daily). Recheck INR in 1 week. Coumadin Clinic [phone].

## 2021-06-10 ENCOUNTER — Other Ambulatory Visit (HOSPITAL_COMMUNITY): Payer: Self-pay

## 2021-06-10 ENCOUNTER — Ambulatory Visit: Payer: Medicare HMO | Admitting: Pulmonary Disease

## 2021-06-10 ENCOUNTER — Ambulatory Visit (INDEPENDENT_AMBULATORY_CARE_PROVIDER_SITE_OTHER): Payer: Medicare HMO | Admitting: Pulmonary Disease

## 2021-06-10 ENCOUNTER — Encounter: Payer: Self-pay | Admitting: Pulmonary Disease

## 2021-06-10 ENCOUNTER — Other Ambulatory Visit: Payer: Self-pay

## 2021-06-10 ENCOUNTER — Telehealth: Payer: Self-pay | Admitting: Physician Assistant

## 2021-06-10 VITALS — BP 122/72 | HR 100 | Ht 71.0 in | Wt 268.0 lb

## 2021-06-10 DIAGNOSIS — R0600 Dyspnea, unspecified: Secondary | ICD-10-CM | POA: Diagnosis not present

## 2021-06-10 LAB — PULMONARY FUNCTION TEST
DL/VA % pred: 79 %
DL/VA: 3.13 ml/min/mmHg/L
DLCO cor % pred: 66 %
DLCO cor: 17.2 ml/min/mmHg
DLCO unc % pred: 66 %
DLCO unc: 17.2 ml/min/mmHg
FEF 25-75 Post: 1.79 L/sec
FEF 25-75 Pre: 1.75 L/sec
FEF2575-%Change-Post: 2 %
FEF2575-%Pred-Post: 77 %
FEF2575-%Pred-Pre: 75 %
FEV1-%Change-Post: 0 %
FEV1-%Pred-Post: 73 %
FEV1-%Pred-Pre: 73 %
FEV1-Post: 2.33 L
FEV1-Pre: 2.34 L
FEV1FVC-%Change-Post: 7 %
FEV1FVC-%Pred-Pre: 101 %
FEV6-%Change-Post: -5 %
FEV6-%Pred-Post: 71 %
FEV6-%Pred-Pre: 75 %
FEV6-Post: 2.95 L
FEV6-Pre: 3.12 L
FEV6FVC-%Change-Post: 1 %
FEV6FVC-%Pred-Post: 106 %
FEV6FVC-%Pred-Pre: 104 %
FVC-%Change-Post: -7 %
FVC-%Pred-Post: 66 %
FVC-%Pred-Pre: 72 %
FVC-Post: 2.95 L
FVC-Pre: 3.19 L
Post FEV1/FVC ratio: 79 %
Post FEV6/FVC ratio: 100 %
Pre FEV1/FVC ratio: 74 %
Pre FEV6/FVC Ratio: 98 %
RV % pred: 135 %
RV: 3.54 L
TLC % pred: 95 %
TLC: 6.92 L

## 2021-06-10 MED ORDER — FLUTICASONE PROPIONATE 50 MCG/ACT NA SUSP: 2.0000 | Freq: Every day | NASAL | 11 refills | Status: DC

## 2021-06-10 NOTE — Patient Instructions (Addendum)
We will start you on Flonase.  Use over-the-counter Claritin to help with nasal congestion Prednisone 40 mg a day for 5 days Your PFTs actually look better today compared to prior  Follow-up in 6 months.

## 2021-06-10 NOTE — Patient Instructions (Signed)
Full PFT performed today. °

## 2021-06-10 NOTE — Progress Notes (Signed)
Brandon [name]    [id number]    March 18, 1946  Primary Care Physician:Webb, Arbie Cookey, MD  Referring Physician: Maurice Small, MD [place],   [id number]  Chief complaint:   Follow-up for abnormal CT, concern for ILD  HPI: 75 year old ex-smoker with significant cardiac history of  CVA, paroxysmal atrial fibrillation, valvular heart disease, and chronic diastolic CHF.  He had a prolonged hospitalization in 2018 for heart failure, A. fib with RVR.  Eventually underwent aortic and mitral valve replacement, Maze procedure, LAA clipping  He was started on amiodarone around 2018 2019 and continues on at 800 mg a day.  Follows with Dr. Aundra Dubin in the heart failure clinic  Over the past few months he has noted increasing dyspnea on exertion.  He had a work-up done including PFTs and CT scan which showed changes suggestive of interstitial lung disease and has been referred here for further evaluation.  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  No ongoing exposures.  No mold, hot tub, Jacuzzi.  No feather pillows or comforter Smoking history: 30-pack-year smoker.  Quit in 2018 Travel history: No significant travel history Relevant family history: No family history of lung disease  Interim history: Amiodarone was temporarily held however restarted as he is back in atrial fibrillation He had an  ILD panel which showed marked elevation of CCP.  Evaluated by rheumatology in early May 2022 with no findings of rheumatoid arthritis.  Elevated CCP was felt to be incidental  Tried on Trelegy inhaler.  Telephone note from wife states that it helped with breathing but patient tells me that the inhaler made him cough and he stopped using  Complains of sinus congestion, postnasal drip and chest  heaviness.  He is using over-the-counter Zyrtec without any effect Denies any cough, fevers, chills.  Outpatient Encounter Medications as of 06/10/2021  Medication Sig   acetaminophen (TYLENOL) 325 MG tablet Take 1 tablet (325 mg total) by mouth every 6 (six) hours as needed for mild pain.   amiodarone (PACERONE) 200 MG tablet Take 1 tablet (200 mg total) by mouth daily.   calcium carbonate (TUMS - DOSED IN MG ELEMENTAL CALCIUM) 500 MG chewable tablet Chew 1 tablet by mouth daily as needed for indigestion or heartburn.   carvedilol (COREG) 12.5 MG tablet Take 1.5 tablets (18.75 mg) by mouth twice daily (Patient takes 1 tablet three times daily)   cetirizine (ZYRTEC) 10 MG tablet Take 10 mg by mouth daily as needed for allergies.   citalopram (CELEXA) 20 MG tablet Take 1 tablet (20 mg total) by mouth at bedtime.   docusate sodium (COLACE) 100 MG capsule Take 1 capsule (100 mg total) by mouth 3 (three) times daily as needed.   empagliflozin (JARDIANCE) 10 MG TABS tablet TAKE 1 TABLET BY MOUTH ONCE A DAY BEFORE BREAKFAST   EPINEPHrine (EPIPEN JR) 0.15 MG/0.3ML injection    furosemide (LASIX) 40 MG tablet Take '40mg'$  in the AM and '20mg'$  in the PM.   guaiFENesin (MUCINEX) 600 MG 12 hr tablet Take 600 mg by mouth 2 (two) times daily.   methocarbamol (ROBAXIN) 500 MG tablet Take 1 tablet (500 mg total) by mouth every 8 (eight) hours as needed for muscle spasms.   pantoprazole (PROTONIX) 40 MG tablet Take 1 tablet (40  mg total) by mouth daily.   Pseudoeph-Doxylamine-DM-APAP (NYQUIL PO) Take by mouth as needed.   rosuvastatin (CRESTOR) 10 MG tablet TAKE 1 TABLET EVERY DAY   sacubitril-valsartan (ENTRESTO) 49-51 MG TAKE 1 TABLET BY MOUTH 2 TIMES DAILY   spironolactone (ALDACTONE) 25 MG tablet TAKE 1 TABLET EVERY DAY   traZODone (DESYREL) 50 MG tablet Take 100 mg by mouth at bedtime.   warfarin (COUMADIN) 2.5 MG tablet Take 1/2 to 1 tablet by mouth once daily as directed by Coumadin Clinic   No  facility-administered encounter medications on file as of 06/10/2021.   Physical Exam: Blood pressure 122/72, pulse 100, height '5\' 11"'$  (1.803 m), weight 268 lb (121.6 kg), SpO2 97 %. Gen:      No acute distress HEENT:  EOMI, sclera anicteric Neck:     No masses; no thyromegaly Lungs:    Clear to auscultation bilaterally; normal respiratory effort CV:         Regular rate and rhythm; no murmurs Abd:      + bowel sounds; soft, non-tender; no palpable masses, no distension Ext:    No edema; adequate peripheral perfusion Skin:      Warm and dry; no rash Neuro: alert and oriented x 3 Psych: normal mood and affect   Data Reviewed: Imaging: CT coronary 09/28/17-no interstitial lung disease, mild bibasal atelectasis  High-res CT 01/20/2021-bilateral groundglass attenuation most evident in the mid to lower lungs with mild reticulation, moderate air trapping.   High-res CT 05/08/2021-minimal nonspecific peripheral interstitial opacity, air trapping, bronchial wall thickening. I have reviewed the images personally  PFTs: 01/07/2021 FVC 2.70 (60%), FEV1 2.19 (16%), F/F 81, TLC 6.03 [82%], DLCO 13.20 [50%] Moderate diffusion defect  06/10/2021 FVC 2.95 [6%], FEV1 2.33 [73%], F/F 79, TLC 6.92 [95%], DLCO 17.20 (66%] Mild restriction, diffusion defect.  Improved compared to January 2022  Labs: Sed rate 01/29/21-46  CTD serologies 02/24/2021-positive for CCP greater than 250  Assessment:  Evaluation for interstitial lung disease I have reviewed his CT scan which shows groundglass opacities with minimal reticulation.  The appearance of this is nonspecific and there is no clear evidence of interstitial lung disease or pulmonary fibrosis.  The groundglass changes may be from heart failure  Back on amiodarone due to recurrent atrial fibrillation Follow-up high-res CT shows minimal unchanged peripheral interstitial opacities.  His PFTs are actually better today compared to January  Off inhalers its  not helping with the breathing  Sinus congestion Flonase, Claritin Prednisone 40 mg a day for 5 days  Plan/Recommendations: - Flonase, Claritin - Short prednisone taper  Marshell Garfinkel MD [facility] 06/10/2021, 11:14 AM  CC: Maurice Small, MD

## 2021-06-10 NOTE — Telephone Encounter (Signed)
   Patient's wife called, returning a call from the office.  Was able to find where they were trying to contact Mr. Colomb because he had been in atrial fibrillation for well over a month, his activity level had been trending down over the last few days.  They were calling to check on them.  Mrs. Hertzberg said that he had PFTs today and then had to wait a long time before he could see the doctor.  He was not able to go home and rest in between.  After the appointment, he was exhausted.  He feels completely washed out.  She feels this may be at least partly secondary to his atrial fibrillation.  He is compliant with the amiodarone and the Coumadin.  However, his Coumadin level had been elevated and they told him to hold it for 3 days.  After he did so, his INR dropped to 1.9.  This was on June 28.  His Coumadin has been restarted.  He has no bleeding issues.  She does not think he has gained any weight.  He is not particularly short of breath, just exhausted.  We discussed if he should come to the ER.  She feels that if he takes a nap and rest for a while, he will be fine.  She does not wish to take him to the ER unless he gets worse.  At this time, she is to monitor him closely and let them get some rest.  I advised her that if he did not improve with some rest, she would need to call 911 and bring him in.  I messaged Ricky Fenton, PA-C at the [facility] clinic, and they have been trying to reach him.  Hopefully they will be able to get in touch with him and bring him in tomorrow for a visit.  Rosaria Ferries, PA-C 06/10/2021 4:31 PM

## 2021-06-10 NOTE — Telephone Encounter (Signed)
2nd attempt to contact patient. No answer, LMTCB. 

## 2021-06-10 NOTE — Addendum Note (Signed)
Addended by: Elton Sin on: 06/10/2021 11:38 AM   Modules accepted: Orders

## 2021-06-10 NOTE — Progress Notes (Signed)
Full PFT performed today. °

## 2021-06-10 NOTE — Telephone Encounter (Signed)
Discussed with patient's wife. Appt made for tomorrow.

## 2021-06-11 ENCOUNTER — Ambulatory Visit (INDEPENDENT_AMBULATORY_CARE_PROVIDER_SITE_OTHER): Payer: Self-pay

## 2021-06-11 ENCOUNTER — Encounter (HOSPITAL_COMMUNITY): Payer: Self-pay | Admitting: Physician Assistant

## 2021-06-11 ENCOUNTER — Other Ambulatory Visit (HOSPITAL_COMMUNITY): Payer: Self-pay

## 2021-06-11 ENCOUNTER — Ambulatory Visit (HOSPITAL_COMMUNITY)
Admission: RE | Admit: 2021-06-11 | Discharge: 2021-06-11 | Disposition: A | Discharge status: Home / Self Care | Payer: Medicare HMO | Source: Ambulatory Visit | Attending: Physician Assistant | Admitting: Physician Assistant

## 2021-06-11 VITALS — BP 100/60 | HR 101 | Ht 71.0 in | Wt 265.8 lb

## 2021-06-11 DIAGNOSIS — I110 Hypertensive heart disease with heart failure: Secondary | ICD-10-CM | POA: Diagnosis not present

## 2021-06-11 DIAGNOSIS — R0600 Dyspnea, unspecified: Secondary | ICD-10-CM | POA: Insufficient documentation

## 2021-06-11 DIAGNOSIS — I2510 Atherosclerotic heart disease of native coronary artery without angina pectoris: Secondary | ICD-10-CM | POA: Insufficient documentation

## 2021-06-11 DIAGNOSIS — Z9889 Other specified postprocedural states: Secondary | ICD-10-CM

## 2021-06-11 DIAGNOSIS — G4733 Obstructive sleep apnea (adult) (pediatric): Secondary | ICD-10-CM | POA: Insufficient documentation

## 2021-06-11 DIAGNOSIS — I4819 Other persistent atrial fibrillation: Secondary | ICD-10-CM | POA: Diagnosis not present

## 2021-06-11 DIAGNOSIS — Z8679 Personal history of other diseases of the circulatory system: Secondary | ICD-10-CM

## 2021-06-11 DIAGNOSIS — I480 Paroxysmal atrial fibrillation: Secondary | ICD-10-CM

## 2021-06-11 DIAGNOSIS — Z87891 Personal history of nicotine dependence: Secondary | ICD-10-CM | POA: Diagnosis not present

## 2021-06-11 DIAGNOSIS — Z8249 Family history of ischemic heart disease and other diseases of the circulatory system: Secondary | ICD-10-CM | POA: Diagnosis not present

## 2021-06-11 DIAGNOSIS — Z951 Presence of aortocoronary bypass graft: Secondary | ICD-10-CM

## 2021-06-11 DIAGNOSIS — Z713 Dietary counseling and surveillance: Secondary | ICD-10-CM | POA: Diagnosis not present

## 2021-06-11 DIAGNOSIS — Z7901 Long term (current) use of anticoagulants: Secondary | ICD-10-CM | POA: Insufficient documentation

## 2021-06-11 DIAGNOSIS — I484 Atypical atrial flutter: Secondary | ICD-10-CM | POA: Diagnosis not present

## 2021-06-11 DIAGNOSIS — Z6837 Body mass index (BMI) 37.0-37.9, adult: Secondary | ICD-10-CM | POA: Insufficient documentation

## 2021-06-11 DIAGNOSIS — Z953 Presence of xenogenic heart valve: Secondary | ICD-10-CM

## 2021-06-11 DIAGNOSIS — Z5181 Encounter for therapeutic drug level monitoring: Secondary | ICD-10-CM

## 2021-06-11 DIAGNOSIS — I5022 Chronic systolic (congestive) heart failure: Secondary | ICD-10-CM | POA: Diagnosis not present

## 2021-06-11 DIAGNOSIS — E669 Obesity, unspecified: Secondary | ICD-10-CM | POA: Diagnosis not present

## 2021-06-11 DIAGNOSIS — D6869 Other thrombophilia: Secondary | ICD-10-CM

## 2021-06-11 DIAGNOSIS — I4892 Unspecified atrial flutter: Secondary | ICD-10-CM | POA: Diagnosis not present

## 2021-06-11 LAB — COMPREHENSIVE METABOLIC PANEL
ALT: 15 U/L (ref 0–44)
AST: 20 U/L (ref 15–41)
Albumin: 3.4 g/dL — ABNORMAL LOW (ref 3.5–5.0)
Alkaline Phosphatase: 32 U/L — ABNORMAL LOW (ref 38–126)
Anion gap: 12 (ref 5–15)
BUN: 51 mg/dL — ABNORMAL HIGH (ref 8–23)
CO2: 18 mmol/L — ABNORMAL LOW (ref 22–32)
Calcium: 8.9 mg/dL (ref 8.9–10.3)
Chloride: 107 mmol/L (ref 98–111)
Creatinine, Ser: 2.98 mg/dL — ABNORMAL HIGH (ref 0.61–1.24)
GFR, Estimated: 21 mL/min — ABNORMAL LOW (ref >60)
Glucose, Bld: 117 mg/dL — ABNORMAL HIGH (ref 70–99)
Potassium: 4.5 mmol/L (ref 3.5–5.1)
Sodium: 137 mmol/L (ref 135–145)
Total Bilirubin: 0.7 mg/dL (ref 0.3–1.2)
Total Protein: 7.6 g/dL (ref 6.5–8.1)

## 2021-06-11 LAB — CBC
HCT: 43.7 % (ref 39.0–52.0)
Hemoglobin: 13.7 g/dL (ref 13.0–17.0)
MCH: 32 pg (ref 26.0–34.0)
MCHC: 31.4 g/dL (ref 30.0–36.0)
MCV: 102.1 fL — ABNORMAL HIGH (ref 80.0–100.0)
Platelets: 226 10*3/uL (ref 150–400)
RBC: 4.28 MIL/uL (ref 4.22–5.81)
RDW: 13.9 % (ref 11.5–15.5)
WBC: 10.8 10*3/uL — ABNORMAL HIGH (ref 4.0–10.5)
nRBC: 0 % (ref 0.0–0.2)

## 2021-06-11 LAB — PROTIME-INR
INR: 1.9 — ABNORMAL HIGH (ref 0.8–1.2)
Prothrombin Time: 21.9 seconds — ABNORMAL HIGH (ref 11.4–15.2)

## 2021-06-11 LAB — TSH: TSH: 2.997 u[IU]/mL (ref 0.350–4.500)

## 2021-06-11 MED ORDER — PREDNISONE 20 MG PO TABS
ORAL_TABLET | ORAL | 0 refills | Status: DC
  Filled 2021-06-11: qty 10, 5d supply, fill #0

## 2021-06-11 NOTE — Patient Instructions (Signed)
Description    Pt now using warfarin 2.'5mg'$  tablets. Continue on same dosage Warfarin 1 tablet (2.'5mg'$  daily) since starting on Prednisone '40mg'$  QD x 5 days today 06/11/21. Recheck INR on 06/15/21. Coumadin Clinic [phone].

## 2021-06-11 NOTE — Patient Instructions (Signed)
Cardioversion scheduled for Friday, July 8th  - Have INR check at coumadin clinic prior to arrival - they will call with time  - Arrive at the [facility] and go to admitting at [place] not eat or drink anything after midnight the night prior to your procedure.  - Take all your morning medication (except diabetic medications) with a sip of water prior to arrival.  - You will not be able to drive home after your procedure.  - Do NOT miss any doses of your blood thinner - if you should miss a dose please notify our office immediately.  - If you feel as if you go back into normal rhythm prior to scheduled cardioversion, please notify our office immediately. If your procedure is canceled in the cardioversion suite you will be charged a cancellation fee.  Patients will be asked to: to mask in public and hand hygiene (no longer quarantine) in the 3 days prior to surgery, to report if any COVID-19-like illness or household contacts to COVID-19 to determine need for testing

## 2021-06-11 NOTE — Telephone Encounter (Signed)
Please see the following: Hi   Brandon [name] was there today and in notes it was mentioned about Prednisone 40 mg a day for 5 days.  It wasn't mentioned in visit summary.    Where was it called to?  [facility] doesn't have order nor [facility].   Please advise.   Thank you. Areeb Korbel (for Brandon [phone]   Device Clinic called today.  Because of his Afib he will be going to Afib clinic in morning.       I can send in the RX just need to know how you would like this to be sent in.

## 2021-06-11 NOTE — Progress Notes (Signed)
Primary Care Physician: Maurice Small, MD Primary Cardiologist: Dr Aundra Dubin Primary Electrophysiologist: Dr Lovena Le Referring Physician: Dr Berenice Bouton is a 75 y.o. male with a history of persistent atrial fibrillation/atrial flutter s/p MAZE 2018, CAD s/p CABG, HTN, s/p aortic and mitral valve replacements, COPD, OSA who presents for follow up in the [facility]. Patient reports that he feels "OK" but his wife reports that she has noticed more SOB and fatigue when he is active. Remote device transmission shows substantially increased AF burden since March 2020. Recently resumed his amiodarone. Amiodarone was stopped due to concern for amio lung toxicity. Pulmonology workup showed no clear evidence for ILRD or pulmonary fibrosis and amiodarone was resumed due to increasing afib burden.  On follow up today, patient reports that he has had more dyspnea with exertion over the last few weeks. His device interrogation showed he has been in afib persistently for the past month. Of note, his INR was 1.7 on 6/28. He had PFTs with his pulmonologist with showed an improvement in lung function. He has been dealing with sinus issues for the last couple weeks.   Today, he denies symptoms of palpitations, chest pain, orthopnea, PND, lower extremity edema, dizziness, presyncope, syncope, bleeding, or neurologic sequela. The patient is tolerating medications without difficulties and is otherwise without complaint today.    Atrial Fibrillation Risk Factors:  he does have symptoms or diagnosis of sleep apnea. he is not compliant with CPAP therapy.  he has a BMI of Body mass index is 37.07 kg/m.[name]   Family History  Problem Relation Age of Onset   Stroke Father        No details   Angina Mother    Kidney cancer Brother      Atrial Fibrillation Management history:  Previous antiarrhythmic drugs: amiodarone Previous cardioversions: 04/2018 Previous ablations:  MAZE CHADS2VASC score: 5 Anticoagulation history: warfarin   Past Medical History:  Diagnosis Date   Anemia 07/13/2017   Anxiety    Aortic insufficiency    Aortic stenosis, moderate 07/13/2017   Arthritis    back    Carotid stenosis    Right carotid stent (widely patent) 40 - 59% left plaque 11/13   CHF (congestive heart failure) (HCC)    Chronic kidney disease    COPD (chronic obstructive pulmonary disease) ([place])    Coronary artery disease involving native coronary artery of native heart without angina pectoris    Depression    Dyslipidemia    GERD (gastroesophageal reflux disease)    Heart murmur    Hemiplegia affecting unspecified side, late effect of cerebrovascular disease    resolved- from L side    Hypertension    Jaundice    resolved following ERCP & Cholecystectomy   Mild emphysema (HCC)    Mitral regurgitation    Mitral valve insufficiency and aortic valve insufficiency    Myocardial infarction ([place])    Paroxysmal atrial fibrillation ([place])    Pre-diabetes    per spouse   S/P aortic valve replacement with bioprosthetic valve 11/16/2017   23 mm Edwards Magna Ease stented bovine pericardial tissue valve   S/P CABG x 1 11/16/2017   SVG to PDA with [facility] via right thigh   S/P Maze operation for atrial fibrillation 11/16/2017   Left side lesion set using bipolar radiofrequency and cryothermy ablation with clipping of LA appendage   S/P mitral valve replacement with bioprosthetic valve 11/16/2017   27 mm [facility] Mitral  stented bovine pericardial tissue valve   Sleep apnea    does not wear CPAP   Sleep concern    resulted in surgery- after + sleep test. Pt. doesn't have a problem any longer.    Stroke ([place]) 03/11/2003   stent placed on the 31, 3, 2004, L side    Wears glasses    Wears hearing aid in both ears    Wears partial dentures    Past Surgical History:  Procedure Laterality Date   AORTIC VALVE REPLACEMENT N/A 11/16/2017   Procedure: AORTIC VALVE  REPLACEMENT (AVR) with 26m Magna Ease;  Surgeon: ORexene Alberts MD;  [facility]  Service: Open Heart Surgery;  Laterality: N/A;   ARTERIAL LINE INSERTION Right 11/16/2017   Procedure: ARTERIAL LINE INSERTION -RIGHT FEMORAL;  Surgeon: ORexene Alberts MD;  [facility]  Service: Open Heart Surgery;  Laterality: Right;   BACK SURGERY     lumbar back   CARDIOVERSION N/A 04/11/2018   Procedure: CARDIOVERSION;  Surgeon: MLarey Dresser MD;  [facility]  Service: Cardiovascular;  Laterality: N/A;   CHOLECYSTECTOMY     CORONARY ARTERY BYPASS GRAFT N/A 11/16/2017   Procedure: CORONARY ARTERY BYPASS GRAFTING (CABG) x 1;  Surgeon: ORexene Alberts MD;  [facility]  Service: Open Heart Surgery;  Laterality: N/A;   ENDOVEIN HARVEST OF GREATER SAPHENOUS VEIN Right 11/16/2017   Procedure: ENDOVEIN HARVEST OF GREATER SAPHENOUS VEIN;  Surgeon: ORexene Alberts MD;  [facility]  Service: Open Heart Surgery;  Laterality: Right;   ERCP N/A 05/31/2013   Procedure: ENDOSCOPIC RETROGRADE CHOLANGIOPANCREATOGRAPHY (ERCP);  Surgeon: MLadene Artist MD;  [facility];  Service: Endoscopy;  Laterality: N/A;   FOOT SURGERY     right   IR RADIOLOGY PERIPHERAL GUIDED IV START  09/28/2017   IR [place] VASC ACCESS RIGHT  09/28/2017   LAPAROSCOPIC CHOLECYSTECTOMY SINGLE PORT N/A 06/01/2013   Procedure: LAPAROSCOPIC CHOLECYSTECTOMY SINGLE PORT;  Surgeon: SAdin Hector MD;  [facility];  Service: General;  Laterality: N/A;   MAZE N/A 11/16/2017   Procedure: MAZE;  Surgeon: ORexene Alberts MD;  [facility]  Service: Open Heart Surgery;  Laterality: N/A;   MITRAL VALVE REPAIR N/A 11/16/2017   Procedure: MITRAL VALVE  REPLACEMENT with 22mMagnaEase;  Surgeon: OwRexene AlbertsMD;  [facility] Service: Open Heart Surgery;  Laterality: N/A;   PACEMAKER IMPLANT N/A 11/21/2017   Procedure: PACEMAKER IMPLANT;  Surgeon: TaEvans LanceMD;  [facility];  Service: Cardiovascular;  Laterality: N/A;   POLYPECTOMY     RIGHT/LEFT HEART CATH AND CORONARY ANGIOGRAPHY N/A 08/30/2017   Procedure: RIGHT/LEFT HEART CATH AND CORONARY ANGIOGRAPHY;  Surgeon: McLarey DresserMD;  [facility];  Service: Cardiovascular;  Laterality: N/A;   SHOULDER ARTHROSCOPY WITH ROTATOR CUFF REPAIR AND SUBACROMIAL DECOMPRESSION Left 05/18/2017   SHOULDER ARTHROSCOPY WITH ROTATOR CUFF REPAIR AND SUBACROMIAL DECOMPRESSION Left 05/18/2017   Procedure: SHOULDER ARTHROSCOPY WITH ROTATOR CUFF REPAIR AND SUBACROMIAL DECOMPRESSION;  Surgeon: ChTania AdeMD;  [facility]: [facility];  Laterality: Left;  LEFT SHOULDER ARTHROSCOPY WITH ROTATOR CUFF REPAIR AND SUBACROMIAL DECOMPRESSION   TEE WITHOUT CARDIOVERSION N/A 05/09/2017   Procedure: TRANSESOPHAGEAL ECHOCARDIOGRAM (TEE);  Surgeon: RaSkeet LatchMD;  [facility] Service: Cardiovascular;  Laterality: N/A;   TEE WITHOUT CARDIOVERSION N/A 08/30/2017   Procedure: TRANSESOPHAGEAL ECHOCARDIOGRAM (TEE);  Surgeon: McLarey DresserMD;  [facility];  Service: Cardiovascular;  Laterality: N/A;   TEE WITHOUT CARDIOVERSION N/A 11/16/2017   Procedure: TRANSESOPHAGEAL ECHOCARDIOGRAM (TEE);  Surgeon: Rexene Alberts, MD;  Location: [place];  Service: Open Heart Surgery;  Laterality: N/A;   TEE WITHOUT CARDIOVERSION N/A 04/11/2018   Procedure: TRANSESOPHAGEAL ECHOCARDIOGRAM (TEE);  Surgeon: Larey Dresser, MD;  [facility];  Service: Cardiovascular;  Laterality: N/A;   TONSILLECTOMY      Current Outpatient Medications  Medication Sig Dispense Refill   acetaminophen (TYLENOL) 325 MG tablet Take 1 tablet (325 mg total) by mouth every 6 (six) hours as needed for mild pain. 30 tablet 0   amiodarone (PACERONE) 200 MG tablet Take 1 tablet (200 mg total) by mouth daily. 30 tablet 3   calcium carbonate (TUMS - DOSED IN MG ELEMENTAL CALCIUM) 500 MG chewable tablet Chew 1  tablet by mouth daily as needed for indigestion or heartburn.     carvedilol (COREG) 12.5 MG tablet Take 1.5 tablets (18.75 mg) by mouth twice daily (Patient takes 1 tablet three times daily) 270 tablet 3   cetirizine (ZYRTEC) 10 MG tablet Take 10 mg by mouth daily as needed for allergies.     citalopram (CELEXA) 20 MG tablet Take 1 tablet (20 mg total) by mouth at bedtime. 30 tablet 5   docusate sodium (COLACE) 100 MG capsule Take 1 capsule (100 mg total) by mouth 3 (three) times daily as needed. 20 capsule 0   empagliflozin (JARDIANCE) 10 MG TABS tablet TAKE 1 TABLET BY MOUTH ONCE A DAY BEFORE BREAKFAST 30 tablet 11   EPINEPHrine (EPIPEN JR) 0.15 MG/0.3ML injection      fluticasone (FLONASE) 50 MCG/ACT nasal spray Place 2 sprays into both nostrils daily. 16 g 11   furosemide (LASIX) 40 MG tablet Take '40mg'$  in the AM and '20mg'$  in the PM. 30 tablet 0   guaiFENesin (MUCINEX) 600 MG 12 hr tablet Take 600 mg by mouth 2 (two) times daily.     methocarbamol (ROBAXIN) 500 MG tablet Take 1 tablet (500 mg total) by mouth every 8 (eight) hours as needed for muscle spasms. 30 tablet 0   pantoprazole (PROTONIX) 40 MG tablet Take 1 tablet (40 mg total) by mouth daily. 30 tablet 1   Pseudoeph-Doxylamine-DM-APAP (NYQUIL PO) Take by mouth as needed.     rosuvastatin (CRESTOR) 10 MG tablet TAKE 1 TABLET EVERY DAY 90 tablet 0   sacubitril-valsartan (ENTRESTO) 49-51 MG TAKE 1 TABLET BY MOUTH 2 TIMES DAILY 60 tablet 11   spironolactone (ALDACTONE) 25 MG tablet TAKE 1 TABLET EVERY DAY 90 tablet 3   traZODone (DESYREL) 50 MG tablet Take 100 mg by mouth at bedtime.     warfarin (COUMADIN) 2.5 MG tablet Take 1/2 to 1 tablet by mouth once daily as directed by Coumadin Clinic 90 tablet 1   predniSONE (DELTASONE) 20 MG tablet Take '40mg'$  for 5 days 10 tablet 0   No current facility-administered medications for this encounter.    Allergies  Allergen Reactions   Bee Venom Anaphylaxis and Swelling    Social History    Socioeconomic History   Marital status: Married    Spouse name: Not on file   Number of children: 3   Years of education: Not on file   Highest education level: Not on file  Occupational History    Employer: RETIRED  Tobacco Use   Smoking status: Former    Packs/day: 0.50    Years: 57.00    Pack years:  28.50    Types: Cigarettes    Quit date: 2018    Years since quitting: 4.4   Smokeless tobacco: Never  Vaping Use   Vaping Use: Former   Quit date: 07/13/2017  Substance and Sexual Activity   Alcohol use: Yes    Alcohol/week: 4.0 - 6.0 standard drinks    Types: 3 - 5 Glasses of wine, 1 Shots of liquor per week    Comment: moderate wine ; not since 8/ 2018   Drug use: No   Sexual activity: Yes  Other Topics Concern   Not on file  Social History Narrative   Two living children.  Lives with wife.     Social Determinants of Health   Financial Resource Strain: Not on file  Food Insecurity: Not on file  Transportation Needs: Not on file  Physical Activity: Not on file  Stress: Not on file  Social Connections: Not on file  Intimate Partner Violence: Not on file     ROS- All systems are reviewed and negative except as per the HPI above.  Physical Exam: Vitals:   06/11/21 1052  BP: 100/60  Pulse: (!) 101  Weight: 120.6 kg  Height: '5\' 11"'$  (1.803 m)     GEN- The patient is a well appearing obese elderly male, alert and oriented x 3 today.   HEENT-head normocephalic, atraumatic, sclera clear, conjunctiva pink, hearing intact, trachea midline. Lungs- Clear to ausculation bilaterally, normal work of breathing Heart- Regular rate and rhythm, no murmurs, rubs or gallops  GI- soft, NT, ND, + BS Extremities- no clubbing, cyanosis, or edema MS- no significant deformity or atrophy Skin- no rash or lesion Psych- euthymic mood, full affect Neuro- strength and sensation are intact   Wt Readings from Last 3 Encounters:  06/11/21 120.6 kg  06/10/21 121.6 kg  05/19/21  124.5 kg    EKG today demonstrates  Atypical atrial flutter, LBBB, intermittent V pacing Vent. rate 101 BPM PR interval 204 ms QRS duration 146 ms QT/QTcB 390/505 ms  Echo 10/29/20 demonstrated 1. Left ventricular ejection fraction, by estimation, is 50 to 55%. The  left ventricle has low normal function. The left ventricle demonstrates  global hypokinesis. There is mild left ventricular hypertrophy. Left  ventricular diastolic parameters are  indeterminate.   2. Right ventricular systolic function is normal. The right ventricular  size is mildly enlarged. There is normal pulmonary artery systolic  pressure. The estimated right ventricular systolic pressure is 0000000 mmHg.   3. Left atrial size was mildly dilated.   4. Bioprosthetic mitral valve with no significant regurgitation and mean  gradient 5 mmHg. Valve is functioning normally.   5. Bioprosthetic aortic valve with mean gradient 12 mmHg and no  significant regurgitation, the valve functions normally.   6. Aortic dilatation noted. There is mild dilatation of the ascending  aorta, measuring 39 mm.   7. The inferior vena cava is normal in size with greater than 50%  respiratory variability, suggesting right atrial pressure of 3 mmHg.   Epic records are reviewed at length today  Assessment and Plan:  1. Persistent atrial fibrillation/atrial flutter Device transmission shows he is in persistent atrial flutter We discussed therapeutic options today. Will arrange for TEE guided DCCV given his recent low INR.  Check cmet/TSH/cbc/INR Continue amiodarone 200 mg daily Continue warfarin  Continue Coreg 12.5 mg TID (has trouble cutting pill)  This patients CHA2DS2-VASc Score and unadjusted Ischemic Stroke Rate (% per year) is equal to 7.2 % stroke rate/year  from a score of 5  Above score calculated as 1 point each if present [CHF, HTN, DM, Vascular=MI/PAD/Aortic Plaque, Age if 65-74, or Male] Above score calculated as 2 points  each if present [Age > 75, or Stroke/TIA/TE]  2. Obesity Body mass index is 37.07 kg/m. Lifestyle modification was discussed and encouraged including regular physical activity and weight reduction.  3. HTN Stable, no changes today.  4. PPM Followed by Dr Lovena Le and the device clinic.  5. Chronic systolic CHF Followed in the [facility]. No signs or symptoms of fluid overload.  6. CAD No anginal symptoms.  7. OSA Not on CPAP   Follow up with Dr Aundra Dubin as scheduled.    [facility] [address] [place], [place] [address] [phone] 06/11/2021 12:07 PM

## 2021-06-11 NOTE — H&P (View-Only) (Signed)
Primary Care Physician: Maurice Small, MD Primary Cardiologist: Dr Aundra Dubin Primary Electrophysiologist: Dr Lovena Le Referring Physician: Dr Berenice Bouton is a 75 y.o. male with a history of persistent atrial fibrillation/atrial flutter s/p MAZE 2018, CAD s/p CABG, HTN, s/p aortic and mitral valve replacements, COPD, OSA who presents for follow up in the [facility]. Patient reports that he feels "OK" but his wife reports that she has noticed more SOB and fatigue when he is active. Remote device transmission shows substantially increased AF burden since March 2020. Recently resumed his amiodarone. Amiodarone was stopped due to concern for amio lung toxicity. Pulmonology workup showed no clear evidence for ILRD or pulmonary fibrosis and amiodarone was resumed due to increasing afib burden.  On follow up today, patient reports that he has had more dyspnea with exertion over the last few weeks. His device interrogation showed he has been in afib persistently for the past month. Of note, his INR was 1.7 on 6/28. He had PFTs with his pulmonologist with showed an improvement in lung function. He has been dealing with sinus issues for the last couple weeks.   Today, he denies symptoms of palpitations, chest pain, orthopnea, PND, lower extremity edema, dizziness, presyncope, syncope, bleeding, or neurologic sequela. The patient is tolerating medications without difficulties and is otherwise without complaint today.    Atrial Fibrillation Risk Factors:  he does have symptoms or diagnosis of sleep apnea. he is not compliant with CPAP therapy.  he has a BMI of Body mass index is 37.07 kg/m.[name]   Family History  Problem Relation Age of Onset   Stroke Father        No details   Angina Mother    Kidney cancer Brother      Atrial Fibrillation Management history:  Previous antiarrhythmic drugs: amiodarone Previous cardioversions: 04/2018 Previous ablations:  MAZE CHADS2VASC score: 5 Anticoagulation history: warfarin   Past Medical History:  Diagnosis Date   Anemia 07/13/2017   Anxiety    Aortic insufficiency    Aortic stenosis, moderate 07/13/2017   Arthritis    back    Carotid stenosis    Right carotid stent (widely patent) 40 - 59% left plaque 11/13   CHF (congestive heart failure) (HCC)    Chronic kidney disease    COPD (chronic obstructive pulmonary disease) ([place])    Coronary artery disease involving native coronary artery of native heart without angina pectoris    Depression    Dyslipidemia    GERD (gastroesophageal reflux disease)    Heart murmur    Hemiplegia affecting unspecified side, late effect of cerebrovascular disease    resolved- from L side    Hypertension    Jaundice    resolved following ERCP & Cholecystectomy   Mild emphysema (HCC)    Mitral regurgitation    Mitral valve insufficiency and aortic valve insufficiency    Myocardial infarction ([place])    Paroxysmal atrial fibrillation ([place])    Pre-diabetes    per spouse   S/P aortic valve replacement with bioprosthetic valve 11/16/2017   23 mm Edwards Magna Ease stented bovine pericardial tissue valve   S/P CABG x 1 11/16/2017   SVG to PDA with [facility] via right thigh   S/P Maze operation for atrial fibrillation 11/16/2017   Left side lesion set using bipolar radiofrequency and cryothermy ablation with clipping of LA appendage   S/P mitral valve replacement with bioprosthetic valve 11/16/2017   27 mm [facility] Mitral  stented bovine pericardial tissue valve   Sleep apnea    does not wear CPAP   Sleep concern    resulted in surgery- after + sleep test. Pt. doesn't have a problem any longer.    Stroke ([place]) 03/11/2003   stent placed on the 31, 3, 2004, L side    Wears glasses    Wears hearing aid in both ears    Wears partial dentures    Past Surgical History:  Procedure Laterality Date   AORTIC VALVE REPLACEMENT N/A 11/16/2017   Procedure: AORTIC VALVE  REPLACEMENT (AVR) with 48m Magna Ease;  Surgeon: ORexene Alberts MD;  [place] [facility]  Service: Open Heart Surgery;  Laterality: N/A;   ARTERIAL LINE INSERTION Right 11/16/2017   Procedure: ARTERIAL LINE INSERTION -RIGHT FEMORAL;  Surgeon: ORexene Alberts MD;  [place] [facility]  Service: Open Heart Surgery;  Laterality: Right;   BACK SURGERY     lumbar back   CARDIOVERSION N/A 04/11/2018   Procedure: CARDIOVERSION;  Surgeon: MLarey Dresser MD;  [facility]  Service: Cardiovascular;  Laterality: N/A;   CHOLECYSTECTOMY     CORONARY ARTERY BYPASS GRAFT N/A 11/16/2017   Procedure: CORONARY ARTERY BYPASS GRAFTING (CABG) x 1;  Surgeon: ORexene Alberts MD;  [place] [facility]  Service: Open Heart Surgery;  Laterality: N/A;   ENDOVEIN HARVEST OF GREATER SAPHENOUS VEIN Right 11/16/2017   Procedure: ENDOVEIN HARVEST OF GREATER SAPHENOUS VEIN;  Surgeon: ORexene Alberts MD;  [place] [facility]  Service: Open Heart Surgery;  Laterality: Right;   ERCP N/A 05/31/2013   Procedure: ENDOSCOPIC RETROGRADE CHOLANGIOPANCREATOGRAPHY (ERCP);  Surgeon: MLadene Artist MD;  [facility];  Service: Endoscopy;  Laterality: N/A;   FOOT SURGERY     right   IR RADIOLOGY PERIPHERAL GUIDED IV START  09/28/2017   IR [place] VASC ACCESS RIGHT  09/28/2017   LAPAROSCOPIC CHOLECYSTECTOMY SINGLE PORT N/A 06/01/2013   Procedure: LAPAROSCOPIC CHOLECYSTECTOMY SINGLE PORT;  Surgeon: SAdin Hector MD;  [facility];  Service: General;  Laterality: N/A;   MAZE N/A 11/16/2017   Procedure: MAZE;  Surgeon: ORexene Alberts MD;  [place] [facility]  Service: Open Heart Surgery;  Laterality: N/A;   MITRAL VALVE REPAIR N/A 11/16/2017   Procedure: MITRAL VALVE  REPLACEMENT with 238mMagnaEase;  Surgeon: OwRexene AlbertsMD;  [facility] Service: Open Heart Surgery;  Laterality: N/A;   PACEMAKER IMPLANT N/A 11/21/2017   Procedure: PACEMAKER IMPLANT;  Surgeon: TaEvans LanceMD;  [facility];  Service: Cardiovascular;  Laterality: N/A;   POLYPECTOMY     RIGHT/LEFT HEART CATH AND CORONARY ANGIOGRAPHY N/A 08/30/2017   Procedure: RIGHT/LEFT HEART CATH AND CORONARY ANGIOGRAPHY;  Surgeon: McLarey DresserMD;  [facility];  Service: Cardiovascular;  Laterality: N/A;   SHOULDER ARTHROSCOPY WITH ROTATOR CUFF REPAIR AND SUBACROMIAL DECOMPRESSION Left 05/18/2017   SHOULDER ARTHROSCOPY WITH ROTATOR CUFF REPAIR AND SUBACROMIAL DECOMPRESSION Left 05/18/2017   Procedure: SHOULDER ARTHROSCOPY WITH ROTATOR CUFF REPAIR AND SUBACROMIAL DECOMPRESSION;  Surgeon: ChTania AdeMD;  [facility]: Orthopedics;  Laterality: Left;  LEFT SHOULDER ARTHROSCOPY WITH ROTATOR CUFF REPAIR AND SUBACROMIAL DECOMPRESSION   TEE WITHOUT CARDIOVERSION N/A 05/09/2017   Procedure: TRANSESOPHAGEAL ECHOCARDIOGRAM (TEE);  Surgeon: RaSkeet LatchMD;  [facility] Service: Cardiovascular;  Laterality: N/A;   TEE WITHOUT CARDIOVERSION N/A 08/30/2017   Procedure: TRANSESOPHAGEAL ECHOCARDIOGRAM (TEE);  Surgeon: McLarey DresserMD;  [facility];  Service: Cardiovascular;  Laterality: N/A;   TEE WITHOUT CARDIOVERSION N/A 11/16/2017   Procedure: TRANSESOPHAGEAL ECHOCARDIOGRAM (TEE);  Surgeon: Rexene Alberts, MD;  Location: [place];  Service: Open Heart Surgery;  Laterality: N/A;   TEE WITHOUT CARDIOVERSION N/A 04/11/2018   Procedure: TRANSESOPHAGEAL ECHOCARDIOGRAM (TEE);  Surgeon: Larey Dresser, MD;  Location: [facility] ENDOSCOPY;  Service: Cardiovascular;  Laterality: N/A;   TONSILLECTOMY      Current Outpatient Medications  Medication Sig Dispense Refill   acetaminophen (TYLENOL) 325 MG tablet Take 1 tablet (325 mg total) by mouth every 6 (six) hours as needed for mild pain. 30 tablet 0   amiodarone (PACERONE) 200 MG tablet Take 1 tablet (200 mg total) by mouth daily. 30 tablet 3   calcium carbonate (TUMS - DOSED IN MG ELEMENTAL CALCIUM) 500 MG chewable tablet Chew 1  tablet by mouth daily as needed for indigestion or heartburn.     carvedilol (COREG) 12.5 MG tablet Take 1.5 tablets (18.75 mg) by mouth twice daily (Patient takes 1 tablet three times daily) 270 tablet 3   cetirizine (ZYRTEC) 10 MG tablet Take 10 mg by mouth daily as needed for allergies.     citalopram (CELEXA) 20 MG tablet Take 1 tablet (20 mg total) by mouth at bedtime. 30 tablet 5   docusate sodium (COLACE) 100 MG capsule Take 1 capsule (100 mg total) by mouth 3 (three) times daily as needed. 20 capsule 0   empagliflozin (JARDIANCE) 10 MG TABS tablet TAKE 1 TABLET BY MOUTH ONCE A DAY BEFORE BREAKFAST 30 tablet 11   EPINEPHrine (EPIPEN JR) 0.15 MG/0.3ML injection      fluticasone (FLONASE) 50 MCG/ACT nasal spray Place 2 sprays into both nostrils daily. 16 g 11   furosemide (LASIX) 40 MG tablet Take '40mg'$  in the AM and '20mg'$  in the PM. 30 tablet 0   guaiFENesin (MUCINEX) 600 MG 12 hr tablet Take 600 mg by mouth 2 (two) times daily.     methocarbamol (ROBAXIN) 500 MG tablet Take 1 tablet (500 mg total) by mouth every 8 (eight) hours as needed for muscle spasms. 30 tablet 0   pantoprazole (PROTONIX) 40 MG tablet Take 1 tablet (40 mg total) by mouth daily. 30 tablet 1   Pseudoeph-Doxylamine-DM-APAP (NYQUIL PO) Take by mouth as needed.     rosuvastatin (CRESTOR) 10 MG tablet TAKE 1 TABLET EVERY DAY 90 tablet 0   sacubitril-valsartan (ENTRESTO) 49-51 MG TAKE 1 TABLET BY MOUTH 2 TIMES DAILY 60 tablet 11   spironolactone (ALDACTONE) 25 MG tablet TAKE 1 TABLET EVERY DAY 90 tablet 3   traZODone (DESYREL) 50 MG tablet Take 100 mg by mouth at bedtime.     warfarin (COUMADIN) 2.5 MG tablet Take 1/2 to 1 tablet by mouth once daily as directed by [facility] 90 tablet 1   predniSONE (DELTASONE) 20 MG tablet Take '40mg'$  for 5 days 10 tablet 0   No current facility-administered medications for this encounter.    Allergies  Allergen Reactions   Bee Venom Anaphylaxis and Swelling    Social History    Socioeconomic History   Marital status: Married    Spouse name: Not on file   Number of children: 3   Years of education: Not on file   Highest education level: Not on file  Occupational History    Employer: RETIRED  Tobacco Use   Smoking status: Former    Packs/day: 0.50    Years: 57.00    Pack years:  28.50    Types: Cigarettes    Quit date: 2018    Years since quitting: 4.4   Smokeless tobacco: Never  Vaping Use   Vaping Use: Former   Quit date: 07/13/2017  Substance and Sexual Activity   Alcohol use: Yes    Alcohol/week: 4.0 - 6.0 standard drinks    Types: 3 - 5 Glasses of wine, 1 Shots of liquor per week    Comment: moderate wine ; not since 8/ 2018   Drug use: No   Sexual activity: Yes  Other Topics Concern   Not on file  Social History Narrative   Two living children.  Lives with wife.     Social Determinants of Health   Financial Resource Strain: Not on file  Food Insecurity: Not on file  Transportation Needs: Not on file  Physical Activity: Not on file  Stress: Not on file  Social Connections: Not on file  Intimate Partner Violence: Not on file     ROS- All systems are reviewed and negative except as per the HPI above.  Physical Exam: Vitals:   06/11/21 1052  BP: 100/60  Pulse: (!) 101  Weight: 120.6 kg  Height: '5\' 11"'$  (1.803 m)     GEN- The patient is a well appearing obese elderly male, alert and oriented x 3 today.   HEENT-head normocephalic, atraumatic, sclera clear, conjunctiva pink, hearing intact, trachea midline. Lungs- Clear to ausculation bilaterally, normal work of breathing Heart- Regular rate and rhythm, no murmurs, rubs or gallops  GI- soft, NT, ND, + BS Extremities- no clubbing, cyanosis, or edema MS- no significant deformity or atrophy Skin- no rash or lesion Psych- euthymic mood, full affect Neuro- strength and sensation are intact   Wt Readings from Last 3 Encounters:  06/11/21 120.6 kg  06/10/21 121.6 kg  05/19/21  124.5 kg    EKG today demonstrates  Atypical atrial flutter, LBBB, intermittent V pacing Vent. rate 101 BPM PR interval 204 ms QRS duration 146 ms QT/QTcB 390/505 ms  Echo 10/29/20 demonstrated 1. Left ventricular ejection fraction, by estimation, is 50 to 55%. The  left ventricle has low normal function. The left ventricle demonstrates  global hypokinesis. There is mild left ventricular hypertrophy. Left  ventricular diastolic parameters are  indeterminate.   2. Right ventricular systolic function is normal. The right ventricular  size is mildly enlarged. There is normal pulmonary artery systolic  pressure. The estimated right ventricular systolic pressure is 0000000 mmHg.   3. Left atrial size was mildly dilated.   4. Bioprosthetic mitral valve with no significant regurgitation and mean  gradient 5 mmHg. Valve is functioning normally.   5. Bioprosthetic aortic valve with mean gradient 12 mmHg and no  significant regurgitation, the valve functions normally.   6. Aortic dilatation noted. There is mild dilatation of the ascending  aorta, measuring 39 mm.   7. The inferior vena cava is normal in size with greater than 50%  respiratory variability, suggesting right atrial pressure of 3 mmHg.   Epic records are reviewed at length today  Assessment and Plan:  1. Persistent atrial fibrillation/atrial flutter Device transmission shows he is in persistent atrial flutter We discussed therapeutic options today. Will arrange for TEE guided DCCV given his recent low INR.  Check cmet/TSH/cbc/INR Continue amiodarone 200 mg daily Continue warfarin  Continue Coreg 12.5 mg TID (has trouble cutting pill)  This patients CHA2DS2-VASc Score and unadjusted Ischemic Stroke Rate (% per year) is equal to 7.2 % stroke rate/year  from a score of 5  Above score calculated as 1 point each if present [CHF, HTN, DM, Vascular=MI/PAD/Aortic Plaque, Age if 65-74, or Male] Above score calculated as 2 points  each if present [Age > 75, or Stroke/TIA/TE]  2. Obesity Body mass index is 37.07 kg/m. Lifestyle modification was discussed and encouraged including regular physical activity and weight reduction.  3. HTN Stable, no changes today.  4. PPM Followed by Dr Lovena Le and the device clinic.  5. Chronic systolic CHF Followed in the [facility]. No signs or symptoms of fluid overload.  6. CAD No anginal symptoms.  7. OSA Not on CPAP   Follow up with Dr Aundra Dubin as scheduled.    [facility] [address] [place], [place] [address] [phone] 06/11/2021 12:07 PM

## 2021-06-15 ENCOUNTER — Other Ambulatory Visit: Payer: Self-pay

## 2021-06-15 ENCOUNTER — Ambulatory Visit (INDEPENDENT_AMBULATORY_CARE_PROVIDER_SITE_OTHER): Payer: Medicare HMO | Admitting: Pharmacist

## 2021-06-15 DIAGNOSIS — I480 Paroxysmal atrial fibrillation: Secondary | ICD-10-CM | POA: Diagnosis not present

## 2021-06-15 DIAGNOSIS — Z8679 Personal history of other diseases of the circulatory system: Secondary | ICD-10-CM | POA: Diagnosis not present

## 2021-06-15 DIAGNOSIS — Z5181 Encounter for therapeutic drug level monitoring: Secondary | ICD-10-CM | POA: Diagnosis not present

## 2021-06-15 DIAGNOSIS — Z953 Presence of xenogenic heart valve: Secondary | ICD-10-CM | POA: Diagnosis not present

## 2021-06-15 DIAGNOSIS — Z951 Presence of aortocoronary bypass graft: Secondary | ICD-10-CM | POA: Diagnosis not present

## 2021-06-15 DIAGNOSIS — Z9889 Other specified postprocedural states: Secondary | ICD-10-CM

## 2021-06-15 LAB — POCT INR: INR: 2.7 (ref 2.0–3.0)

## 2021-06-15 NOTE — Patient Instructions (Signed)
Description    Pt now using warfarin 2.'5mg'$  tablets. Continue on same dosage Warfarin 1 tablet (2.'5mg'$  daily). Recheck INR on 7/8 before cardioversion and TEE. Coumadin Clinic [phone].

## 2021-06-16 ENCOUNTER — Telehealth (HOSPITAL_COMMUNITY): Payer: Self-pay | Admitting: Pharmacy Technician

## 2021-06-16 NOTE — Telephone Encounter (Signed)
Received fax from [employer], patient's [employer] application has been APPROVED. Coverage dates are from 05/25/21 to 12/11/21.     Phone# [phone] Fax# [phone]

## 2021-06-17 ENCOUNTER — Telehealth: Payer: Self-pay

## 2021-06-17 NOTE — Telephone Encounter (Signed)
Manual transmission received and reviewed.  Patient is still in Afib.  Spoke with pt wife, Brandon [name] and advised will forward to AF clinic.

## 2021-06-17 NOTE — Telephone Encounter (Signed)
The patient is scheduled for a Cardioversion in the morning. The A-fib clinic told them to send a transmission to see if he is still in A-fib. The patient wife would like for [place] to call them when we receive the transmission to let him know if he still in A-fib or not.

## 2021-06-17 NOTE — Telephone Encounter (Signed)
Transmission received 06-17-2021

## 2021-06-18 ENCOUNTER — Ambulatory Visit (HOSPITAL_COMMUNITY): Payer: Medicare HMO | Admitting: Certified Registered Nurse Anesthetist

## 2021-06-18 ENCOUNTER — Other Ambulatory Visit (HOSPITAL_COMMUNITY): Payer: Self-pay | Admitting: *Deleted

## 2021-06-18 ENCOUNTER — Other Ambulatory Visit: Payer: Self-pay

## 2021-06-18 ENCOUNTER — Encounter (HOSPITAL_COMMUNITY): Payer: Self-pay | Admitting: Cardiology

## 2021-06-18 ENCOUNTER — Ambulatory Visit (HOSPITAL_BASED_OUTPATIENT_CLINIC_OR_DEPARTMENT_OTHER)
Admission: RE | Admit: 2021-06-18 | Discharge: 2021-06-18 | Disposition: A | Discharge status: Home / Self Care | Payer: Medicare HMO | Source: Ambulatory Visit | Attending: Physician Assistant | Admitting: Physician Assistant

## 2021-06-18 ENCOUNTER — Ambulatory Visit (HOSPITAL_COMMUNITY)
Admission: RE | Admit: 2021-06-18 | Discharge: 2021-06-18 | Disposition: A | Discharge status: Home / Self Care | Payer: Medicare HMO | Attending: Cardiology | Admitting: Cardiology

## 2021-06-18 ENCOUNTER — Encounter (HOSPITAL_COMMUNITY)
Admission: RE | Disposition: A | Discharge status: Home / Self Care | Payer: Self-pay | Source: Home / Self Care | Attending: Cardiology

## 2021-06-18 ENCOUNTER — Ambulatory Visit (INDEPENDENT_AMBULATORY_CARE_PROVIDER_SITE_OTHER): Payer: Medicare HMO | Admitting: Pharmacist

## 2021-06-18 DIAGNOSIS — Z6837 Body mass index (BMI) 37.0-37.9, adult: Secondary | ICD-10-CM | POA: Diagnosis not present

## 2021-06-18 DIAGNOSIS — Z79899 Other long term (current) drug therapy: Secondary | ICD-10-CM | POA: Diagnosis not present

## 2021-06-18 DIAGNOSIS — Z953 Presence of xenogenic heart valve: Secondary | ICD-10-CM

## 2021-06-18 DIAGNOSIS — I480 Paroxysmal atrial fibrillation: Secondary | ICD-10-CM

## 2021-06-18 DIAGNOSIS — I5022 Chronic systolic (congestive) heart failure: Secondary | ICD-10-CM | POA: Diagnosis not present

## 2021-06-18 DIAGNOSIS — I4891 Unspecified atrial fibrillation: Secondary | ICD-10-CM | POA: Diagnosis not present

## 2021-06-18 DIAGNOSIS — I4892 Unspecified atrial flutter: Secondary | ICD-10-CM | POA: Diagnosis not present

## 2021-06-18 DIAGNOSIS — I110 Hypertensive heart disease with heart failure: Secondary | ICD-10-CM | POA: Insufficient documentation

## 2021-06-18 DIAGNOSIS — Z7984 Long term (current) use of oral hypoglycemic drugs: Secondary | ICD-10-CM | POA: Insufficient documentation

## 2021-06-18 DIAGNOSIS — Z951 Presence of aortocoronary bypass graft: Secondary | ICD-10-CM | POA: Diagnosis not present

## 2021-06-18 DIAGNOSIS — Z91030 Bee allergy status: Secondary | ICD-10-CM | POA: Insufficient documentation

## 2021-06-18 DIAGNOSIS — G4733 Obstructive sleep apnea (adult) (pediatric): Secondary | ICD-10-CM | POA: Insufficient documentation

## 2021-06-18 DIAGNOSIS — J449 Chronic obstructive pulmonary disease, unspecified: Secondary | ICD-10-CM | POA: Insufficient documentation

## 2021-06-18 DIAGNOSIS — I2510 Atherosclerotic heart disease of native coronary artery without angina pectoris: Secondary | ICD-10-CM | POA: Insufficient documentation

## 2021-06-18 DIAGNOSIS — I484 Atypical atrial flutter: Secondary | ICD-10-CM | POA: Diagnosis not present

## 2021-06-18 DIAGNOSIS — Z87891 Personal history of nicotine dependence: Secondary | ICD-10-CM | POA: Insufficient documentation

## 2021-06-18 DIAGNOSIS — Z7901 Long term (current) use of anticoagulants: Secondary | ICD-10-CM | POA: Diagnosis not present

## 2021-06-18 DIAGNOSIS — E669 Obesity, unspecified: Secondary | ICD-10-CM | POA: Insufficient documentation

## 2021-06-18 DIAGNOSIS — Z9889 Other specified postprocedural states: Secondary | ICD-10-CM | POA: Diagnosis not present

## 2021-06-18 DIAGNOSIS — I088 Other rheumatic multiple valve diseases: Secondary | ICD-10-CM | POA: Diagnosis not present

## 2021-06-18 DIAGNOSIS — Z950 Presence of cardiac pacemaker: Secondary | ICD-10-CM | POA: Insufficient documentation

## 2021-06-18 DIAGNOSIS — Z8679 Personal history of other diseases of the circulatory system: Secondary | ICD-10-CM | POA: Diagnosis not present

## 2021-06-18 DIAGNOSIS — Z5181 Encounter for therapeutic drug level monitoring: Secondary | ICD-10-CM

## 2021-06-18 DIAGNOSIS — J441 Chronic obstructive pulmonary disease with (acute) exacerbation: Secondary | ICD-10-CM | POA: Diagnosis not present

## 2021-06-18 DIAGNOSIS — I4819 Other persistent atrial fibrillation: Secondary | ICD-10-CM | POA: Insufficient documentation

## 2021-06-18 HISTORY — PX: TEE WITHOUT CARDIOVERSION: SHX5443

## 2021-06-18 HISTORY — PX: CARDIOVERSION: SHX1299

## 2021-06-18 LAB — POCT INR: INR: 2.5 (ref 2.0–3.0)

## 2021-06-18 SURGERY — ECHOCARDIOGRAM, TRANSESOPHAGEAL
Anesthesia: Monitor Anesthesia Care

## 2021-06-18 MED ORDER — BUTAMBEN-TETRACAINE-BENZOCAINE 2-2-14 % EX AERO
INHALATION_SPRAY | CUTANEOUS | Status: DC | PRN
  Administered 2021-06-18: 2 via TOPICAL

## 2021-06-18 MED ORDER — PHENYLEPHRINE HCL-NACL 10-0.9 MG/250ML-% IV SOLN
INTRAVENOUS | Status: DC | PRN
  Administered 2021-06-18: 25 ug/min via INTRAVENOUS

## 2021-06-18 MED ORDER — PROPOFOL 10 MG/ML IV BOLUS
INTRAVENOUS | Status: DC | PRN
  Administered 2021-06-18: 30 mg via INTRAVENOUS

## 2021-06-18 MED ORDER — SODIUM CHLORIDE 0.9 % IV SOLN: INTRAVENOUS | Status: DC | PRN

## 2021-06-18 MED ORDER — PROPOFOL 500 MG/50ML IV EMUL
INTRAVENOUS | Status: DC | PRN
  Administered 2021-06-18: 125 ug/kg/min via INTRAVENOUS

## 2021-06-18 MED ORDER — PHENYLEPHRINE 40 MCG/ML (10ML) SYRINGE FOR IV PUSH (FOR BLOOD PRESSURE SUPPORT)
PREFILLED_SYRINGE | INTRAVENOUS | Status: DC | PRN
  Administered 2021-06-18: 120 ug via INTRAVENOUS
  Administered 2021-06-18: 80 ug via INTRAVENOUS
  Administered 2021-06-18: 40 ug via INTRAVENOUS
  Administered 2021-06-18 (×3): 120 ug via INTRAVENOUS
  Administered 2021-06-18 (×3): 80 ug via INTRAVENOUS

## 2021-06-18 MED ORDER — SODIUM CHLORIDE 0.9 % IV SOLN: INTRAVENOUS | Status: DC

## 2021-06-18 NOTE — Anesthesia Postprocedure Evaluation (Signed)
Anesthesia Post Note  Patient: Brandon [name]  Procedure(s) Performed: TRANSESOPHAGEAL ECHOCARDIOGRAM (TEE) CARDIOVERSION     Patient location during evaluation: Endoscopy Anesthesia Type: MAC Level of consciousness: awake and alert Pain management: pain level controlled Vital Signs Assessment: post-procedure vital signs reviewed and stable Respiratory status: spontaneous breathing Cardiovascular status: stable Anesthetic complications: no   No notable events documented.  Last Vitals:  Vitals:   06/18/21 1145 06/18/21 1201  BP: (!) 107/52 (!) 97/48  Pulse: (!) 59 (!) 59  Resp: 10 11  Temp:    SpO2: 100% 98%    Last Pain:  Vitals:   06/18/21 1201  TempSrc:   PainSc: 0-No pain                 Nolon Nations

## 2021-06-18 NOTE — Progress Notes (Signed)
  Echocardiogram Echocardiogram Transesophageal has been performed.  Bobbye Charleston 06/18/2021, 11:30 AM

## 2021-06-18 NOTE — Transfer of Care (Signed)
Immediate Anesthesia Transfer of Care Note  Patient: Brandon [name]  Procedure(s) Performed: TRANSESOPHAGEAL ECHOCARDIOGRAM (TEE) CARDIOVERSION  Patient Location: Endoscopy Unit  Anesthesia Type:MAC  Level of Consciousness: drowsy and patient cooperative  Airway & Oxygen Therapy: Patient Spontanous Breathing and Patient connected to face mask oxygen  Post-op Assessment: Report given to RN and Post -op Vital signs reviewed and stable  Post vital signs: Reviewed and stable  Last Vitals:  Vitals Value Taken Time  BP 81/50 06/18/21 1116  Temp    Pulse 59 06/18/21 1118  Resp 11 06/18/21 1118  SpO2 97 % 06/18/21 1118  Vitals shown include unvalidated device data.  Last Pain:  Vitals:   06/18/21 1000  TempSrc: Temporal  PainSc: 0-No pain         Complications: No notable events documented.

## 2021-06-18 NOTE — CV Procedure (Signed)
   TRANSESOPHAGEAL ECHOCARDIOGRAM GUIDED DIRECT CURRENT CARDIOVERSION  NAME:  Brandon [name]   MRN: [id number] DOB:  07-30-1946   ADMIT DATE: 06/18/2021  INDICATIONS: Symptomatic atrial flutter  PROCEDURE:   Informed consent was obtained prior to the procedure. The risks, benefits and alternatives for the procedure were discussed and the patient comprehended these risks.  Risks include, but are not limited to, cough, sore throat, vomiting, nausea, somnolence, esophageal and stomach trauma or perforation, bleeding, low blood pressure, aspiration, pneumonia, infection, trauma to the teeth and death.    After a procedural time-out, the patient was sedated by the anesthesia service. The transesophageal probe was inserted in the esophagus and stomach without difficulty and multiple views were obtained. Anesthesia was monitored by Dr. Lissa Hoard. Patient received 0 mg IV lidocaine and 392 mg IV propofol.  COMPLICATIONS:    Complications: No complications Patient tolerated procedure well.  FINDINGS:  LEFT VENTRICLE: EF = 50-55% with mild septal hypokinesis  RIGHT VENTRICLE: Normal size and function.   LEFT ATRIUM: No thrombus/mass.  LEFT ATRIAL APPENDAGE: No thrombus/mass.   RIGHT ATRIUM: No thrombus/mass.  AORTIC VALVE:  S/P replacement with bioprosthetic aortic valve. No regurgitation. No vegetation.  MITRAL VALVE:    S/P replacement with bioprosthetic mitral valve. Trivial regurgitation. No vegetation.  TRICUSPID VALVE: Normal structure. Mild regurgitation. No vegetation.  PULMONIC VALVE: Grossly normal structure. Trivial regurgitation. No apparent vegetation.  INTERATRIAL SEPTUM: No PFO or ASD seen by color Doppler.  PERICARDIUM: No effusion noted.  DESCENDING AORTA: Moderate diffuse plaque seen   CARDIOVERSION:     Indications:  Symptomatic Atrial Flutter  Procedure Details:  Once the TEE was complete, the patient had the defibrillator pads placed in the anterior and  posterior position. Once an appropriate level of sedation was confirmed, the patient was cardioverted x 1 with 150J of biphasic synchronized energy.  The patient converted to sinus bradycardia with ventricular pacing, confirmed with Engineer, agricultural.  There were no apparent complications.  The patient had normal neuro status and respiratory status post procedure with vitals stable as recorded elsewhere.  Adequate airway was maintained throughout and vital signs monitored per protocol.  Post procedure, he had hypotension which improved with phenylephrine.  Buford Dresser, MD, PhD [facility]  [address] [place], [place] [address] [phone]   11:18 AM

## 2021-06-18 NOTE — Anesthesia Procedure Notes (Signed)
Procedure Name: MAC Date/Time: 06/18/2021 10:35 AM Performed by: Kathryne Hitch, CRNA Pre-anesthesia Checklist: Patient identified, Emergency Drugs available, Suction available and Patient being monitored Patient Re-evaluated:Patient Re-evaluated prior to induction Oxygen Delivery Method: Nasal cannula Preoxygenation: Pre-oxygenation with 100% oxygen Induction Type: IV induction Placement Confirmation: positive ETCO2 Dental Injury: Teeth and Oropharynx as per pre-operative assessment

## 2021-06-18 NOTE — Anesthesia Preprocedure Evaluation (Addendum)
Anesthesia Evaluation  Patient identified by MRN, date of birth, ID band Patient awake    Reviewed: Allergy & Precautions, NPO status , Patient's Chart, lab work & pertinent test results  Airway Mallampati: II  TM Distance: >3 FB Neck ROM: Full    Dental  (+) Dental Advisory Given   Pulmonary shortness of breath and with exertion, sleep apnea , COPD,  COPD inhaler, former smoker,    breath sounds clear to auscultation + decreased breath sounds(-) wheezing      Cardiovascular hypertension, Pt. on medications and Pt. on home beta blockers + CAD, + Past MI, + CABG, + Peripheral Vascular Disease and +CHF  Normal cardiovascular exam+ dysrhythmias Atrial Fibrillation + pacemaker + Valvular Problems/Murmurs AS and MR  Rhythm:Regular Rate:Normal  S/P AVR, MVR, Maze and CABG 1v  11/2017  Pacemaker: Dual chamber for CHB implant 11/2017   Echo 10/2020 1. Left ventricular ejection fraction, by estimation, is 50 to 55%. The left ventricle has low normal function. The left ventricle demonstrates global hypokinesis. There is mild left ventricular hypertrophy. Left ventricular diastolic parameters are indeterminate.  2. Right ventricular systolic function is normal. The right ventricular size is mildly enlarged. There is normal pulmonary artery systolic pressure. The estimated right ventricular systolic pressure is 29.4 mmHg.  3. Left atrial size was mildly dilated.  4. Bioprosthetic mitral valve with no significant regurgitation and mean gradient 5 mmHg. Valve is functioning normally.  5. Bioprosthetic aortic valve with mean gradient 12 mmHg and no significant regurgitation, the valve functions normally.  6. Aortic dilatation noted. There is mild dilatation of the ascending aorta, measuring 39 mm.  7. The inferior vena cava is normal in size with greater than 50% respiratory variability, suggesting right atrial pressure of 3 mmHg.     Neuro/Psych PSYCHIATRIC DISORDERS Anxiety Depression Dysphagia CVA, Residual Symptoms    GI/Hepatic Neg liver ROS, GERD  Medicated and Controlled,  Endo/Other  Hyperlipidemia Obesity  Renal/GU Renal InsufficiencyRenal disease     Musculoskeletal  (+) Arthritis , Osteoarthritis,    Abdominal (+) + obese,   Peds  Hematology  (+) anemia , Coumadin therapy- last dose 4/30 pm   Anesthesia Other Findings   Reproductive/Obstetrics                          Anesthesia Physical Anesthesia Plan  ASA: 3  Anesthesia Plan: MAC   Post-op Pain Management:    Induction: Intravenous  PONV Risk Score and Plan: 1 and Propofol infusion, TIVA and Treatment may vary due to age or medical condition  Airway Management Planned: Natural Airway  Additional Equipment: None  Intra-op Plan:   Post-operative Plan:   Informed Consent: I have reviewed the patients History and Physical, chart, labs and discussed the procedure including the risks, benefits and alternatives for the proposed anesthesia with the patient or authorized representative who has indicated his/her understanding and acceptance.     Dental advisory given  Plan Discussed with: CRNA  Anesthesia Plan Comments:        Anesthesia Quick Evaluation

## 2021-06-18 NOTE — Discharge Instructions (Signed)

## 2021-06-18 NOTE — Interval H&P Note (Signed)
History and Physical Interval Note:  06/18/2021 10:26 AM  Brandon [name]  has presented today for surgery, with the diagnosis of A-FIB.  The various methods of treatment have been discussed with the patient and family. After consideration of risks, benefits and other options for treatment, the patient has consented to  Procedure(s): TRANSESOPHAGEAL ECHOCARDIOGRAM (TEE) (N/A) CARDIOVERSION (N/A) as a surgical intervention.  The patient's history has been reviewed, patient examined, no change in status, stable for surgery.  I have reviewed the patient's chart and labs.  Questions were answered to the patient's satisfaction.     Brandon [name] [name]

## 2021-06-18 NOTE — Patient Instructions (Signed)
Continue on same dosage Warfarin 1 tablet (2.'5mg'$  daily). Recheck INR 1 week post cardioversion and TEE. [facility] [phone].

## 2021-06-22 ENCOUNTER — Ambulatory Visit (HOSPITAL_COMMUNITY)
Admission: RE | Admit: 2021-06-22 | Discharge: 2021-06-22 | Disposition: A | Discharge status: Home / Self Care | Payer: Medicare HMO | Source: Ambulatory Visit | Attending: Cardiology | Admitting: Cardiology

## 2021-06-22 ENCOUNTER — Encounter (HOSPITAL_COMMUNITY): Payer: Self-pay | Admitting: Cardiology

## 2021-06-22 ENCOUNTER — Other Ambulatory Visit: Payer: Self-pay

## 2021-06-22 VITALS — BP 104/60 | HR 60 | Wt 270.8 lb

## 2021-06-22 DIAGNOSIS — Z950 Presence of cardiac pacemaker: Secondary | ICD-10-CM | POA: Diagnosis not present

## 2021-06-22 DIAGNOSIS — E669 Obesity, unspecified: Secondary | ICD-10-CM | POA: Insufficient documentation

## 2021-06-22 DIAGNOSIS — I6930 Unspecified sequelae of cerebral infarction: Secondary | ICD-10-CM | POA: Insufficient documentation

## 2021-06-22 DIAGNOSIS — Z87891 Personal history of nicotine dependence: Secondary | ICD-10-CM | POA: Diagnosis not present

## 2021-06-22 DIAGNOSIS — J849 Interstitial pulmonary disease, unspecified: Secondary | ICD-10-CM | POA: Diagnosis not present

## 2021-06-22 DIAGNOSIS — D509 Iron deficiency anemia, unspecified: Secondary | ICD-10-CM | POA: Insufficient documentation

## 2021-06-22 DIAGNOSIS — J449 Chronic obstructive pulmonary disease, unspecified: Secondary | ICD-10-CM | POA: Insufficient documentation

## 2021-06-22 DIAGNOSIS — I130 Hypertensive heart and chronic kidney disease with heart failure and stage 1 through stage 4 chronic kidney disease, or unspecified chronic kidney disease: Secondary | ICD-10-CM | POA: Diagnosis not present

## 2021-06-22 DIAGNOSIS — I2510 Atherosclerotic heart disease of native coronary artery without angina pectoris: Secondary | ICD-10-CM | POA: Diagnosis not present

## 2021-06-22 DIAGNOSIS — Z79899 Other long term (current) drug therapy: Secondary | ICD-10-CM | POA: Insufficient documentation

## 2021-06-22 DIAGNOSIS — I480 Paroxysmal atrial fibrillation: Secondary | ICD-10-CM | POA: Diagnosis not present

## 2021-06-22 DIAGNOSIS — Z951 Presence of aortocoronary bypass graft: Secondary | ICD-10-CM | POA: Diagnosis not present

## 2021-06-22 DIAGNOSIS — Z953 Presence of xenogenic heart valve: Secondary | ICD-10-CM | POA: Diagnosis not present

## 2021-06-22 DIAGNOSIS — N183 Chronic kidney disease, stage 3 unspecified: Secondary | ICD-10-CM | POA: Insufficient documentation

## 2021-06-22 DIAGNOSIS — G4733 Obstructive sleep apnea (adult) (pediatric): Secondary | ICD-10-CM | POA: Diagnosis not present

## 2021-06-22 DIAGNOSIS — I5022 Chronic systolic (congestive) heart failure: Secondary | ICD-10-CM | POA: Insufficient documentation

## 2021-06-22 DIAGNOSIS — I4892 Unspecified atrial flutter: Secondary | ICD-10-CM | POA: Diagnosis not present

## 2021-06-22 DIAGNOSIS — Z7901 Long term (current) use of anticoagulants: Secondary | ICD-10-CM | POA: Insufficient documentation

## 2021-06-22 DIAGNOSIS — E785 Hyperlipidemia, unspecified: Secondary | ICD-10-CM | POA: Diagnosis not present

## 2021-06-22 LAB — COMPREHENSIVE METABOLIC PANEL
ALT: 12 U/L (ref 0–44)
AST: 14 U/L — ABNORMAL LOW (ref 15–41)
Albumin: 3.3 g/dL — ABNORMAL LOW (ref 3.5–5.0)
Alkaline Phosphatase: 32 U/L — ABNORMAL LOW (ref 38–126)
Anion gap: 12 (ref 5–15)
BUN: 47 mg/dL — ABNORMAL HIGH (ref 8–23)
CO2: 21 mmol/L — ABNORMAL LOW (ref 22–32)
Calcium: 8.8 mg/dL — ABNORMAL LOW (ref 8.9–10.3)
Chloride: 103 mmol/L (ref 98–111)
Creatinine, Ser: 2.61 mg/dL — ABNORMAL HIGH (ref 0.61–1.24)
GFR, Estimated: 25 mL/min — ABNORMAL LOW (ref >60)
Glucose, Bld: 106 mg/dL — ABNORMAL HIGH (ref 70–99)
Potassium: 4.9 mmol/L (ref 3.5–5.1)
Sodium: 136 mmol/L (ref 135–145)
Total Bilirubin: 0.4 mg/dL (ref 0.3–1.2)
Total Protein: 6.6 g/dL (ref 6.5–8.1)

## 2021-06-22 LAB — TSH: TSH: 2.533 u[IU]/mL (ref 0.350–4.500)

## 2021-06-22 LAB — BRAIN NATRIURETIC PEPTIDE: B Natriuretic Peptide: 119.5 pg/mL — ABNORMAL HIGH (ref 0.0–100.0)

## 2021-06-22 MED ORDER — ENTRESTO 24-26 MG PO TABS: 1.0000 | ORAL_TABLET | Freq: Two times a day (BID) | ORAL | 3 refills | Status: DC

## 2021-06-22 NOTE — Progress Notes (Signed)
Remote pacemaker transmission.   

## 2021-06-22 NOTE — Patient Instructions (Signed)
DECREASE entresto to 24/'26mg'$  twice daily  Routine lab work today. Will notify you of abnormal results  Follow up in 6 weeks  Do the following things EVERYDAY: Weigh yourself in the morning before breakfast. Write it down and keep it in a log. Take your medicines as prescribed Eat low salt foods--Limit salt (sodium) to 2000 mg per day.  Stay as active as you can everyday Limit all fluids for the day to less than 2 liters

## 2021-06-22 NOTE — Progress Notes (Signed)
Advanced Heart Failure Clinic Note   PCP: Dr. Justin Mend Cardiology: Dr. Radford Pax HF Cardiology: Dr. Aundra Dubin  75 y.o.with history of CVA, paroxysmal atrial fibrillation, valvular heart disease, and chronic diastolic CHF presents for evaluation of CHF and valvular heart disease.    He had had episodes of dyspnea and initial workup led to a TEE in 5/18 showing normal EF with moderate AS and moderate MR.  He continued to have episodic dyspnea and ended up admitted in 8/18 with shortness of breath and chest pressure.  This led to a long, complicated hospitalization.  He was noted to be volume overloaded with acute diastolic CHF and was also noted to have symptomatic runs of atrial fibrillation with RVR.  Amiodarone was started to control the atrial fibrillation.  He developed respiratory distress => Bipap => intubated.  He became hypotensive, requiring pressors.  He developed AKI.  PNA was noted and he received broad spectrum abx.  He had a prolonged intubation but was eventually extubated.  Given deconditioning from long hospitalization, he went to CIR for a couple of weeks.  LHC in 9/18 showed occluded PDA with left>right collaterals.  TEE in 9/18 showed EF 50%, severe MR (possibly infarct-related with restricted posterior leaflet, and moderate AS + moderate-severe AI (possibly rheumatic).   In 12/18, he had cardiac surgery with bioprosthetic aortic and mitral valves placed.  He had SVG-PDA, Maze, and LA appendage clipping.  Post-op course was complicated by CHB requiring Medtronic dual chamber PPM (His bundle lead placed but captured right bundle so induced LBBB).   Echo 01/02/18 LVEF 30-35%, bioprosthetic MV and AoV function normally, RV mildly dilated with normal systolic function.    He was noted to be in atrial flutter with mild RVR and had TEE-guided DCCV on 04/11/18. TEE showed EF 25-30%, normal bioprosthetic aortic and mitral valves.  Echo in 6/19 showed EF 30-35%, normal bioprosthetic aortic and mitral  valves.  Echo 12/19 with EF up to 50-55%, normal bioprosthetic mitral and aortic valves, normal RV.   In 2020, he went back into atrial fibrillation.  Amiodarone was started, and he returned to NSR.   Echo in 11/21 showed EF 50-55%, mild LVH, mildly dilated RV with normal function, bioprosthetic mitral valve with mean gradient 5 mmHg and no MR, bioprosthetic aortic valve with mean gradient 12 and no AI.  PFTs in 1/22 were restrictive.  High resolution CT chest was done in 2/22, concerning for ILD, usual interstitial pneumonitis.  He saw pulmonary, ESR was mildly elevated.  We decided to try him off amiodarone given concern for possible amiodarone-induced lung toxicity.  He then developed atrial fibrillation off amiodarone, was quite symptomatic.  He underwent TEE-guided DCCV back to NSR in 7/22 and amiodarone was restarted.  TEE showed EF 50-55%, mid septal HK, RV normal, bioprosthetic aortic and mitral valves appeared normal.   He returns today for followup of CHF and valvular disease.  He is a-paced today.  Symptomatically doing better since we got him back into NSR.  He is getting out more, driving. Much more fatigued while in atrial fibrillation.  Currently, gets short of breath walking up inclines but ok on flat ground.  He has been sleeping in his recliner for months.  Weight down 13 lbs.   ECG (personally reviewed): a-paced, poor RWP  Labs (9/18): K 4.5, creatinine 1.42, LDL 90, HDL 36, LFTs normal, TSH normal, hgb 9.6 Labs (10/18): K 4.3, creatinine 1.45 Labs (12/18): K 3.8, creatinine 1.56 Labs (1/19): creatinine 1.68, LDL 66  Labs (3/19): K 4.2, creatinine 1.56 Labs (4/19): K 4.4, creatinine 1.71, hgb 11.8, LDL 75, HDL 43 Labs (6/19): K 4.8, creatinine 2.1, LDL 75, TGs 246 Labs (8/19): K 4.6, creatinine 2.07 Labs (11/19): K 4.6, creatinine 1.29 Labs (2/20): K 4.8, creatinine 1.98 Labs (5/20): LDL 49, HDL 39 Labs (7/20): TSH normal Labs (9/20): TSH normal, K 4.4, creatinine 2.03, hgb  13.5, LFTs normal Labs (12/20): K 4.4, creatinine 1.87, hgb 13.1.  Labs (11/21): LDL 54, K 4.6, creatinine 2.22, LFTs normal, TSH normal Labs (7/22): TSH normal, K 4.5, creatinine 2.98, hgb 13.7  PMH: 1. CVA: Right MCA, 2004.  Minimal residual problems.  2. HTN - Peripheral edema with amlodipine.  3. Hyperlipidemia 4. Left rotator cuff surgery 6/18 5. Carotid stenosis: s/p right carotid stent.  6. GERD 7. H/o CCY 8. Anemia: FOBT negative.  9. Gout  10. Atrial fibrillation: Paroxysmal. Maze in 12/18 with LAA clipping.  - Recurrent atrial fibrillation 2020 => amiodarone.  - Recurrent atrial fibrillation off amiodarone in 7/22, DCCV to NSR and amiodarone restarted.  11. Valvular heart disease: TEE (5/18) with EF 55-60%, moderate AS mean 33 mmHg, moderate MR, normal RV size and systolic function.  - Echo (8/18): EF 55-60%, moderate LVH, moderate AS with mean gradient 33 mmHg, moderate AI, moderate to severe MR.  - TEE (9/18): EF 50%, mild LV dilation, suspect severe MR with posterior leaflet restricted (?infarct-related MR), ERO 0.4 cm^2, moderate AS/moderate-severe AI (possibly rheumatic).   - In 12/18, he had cardiac surgery with bioprosthetic aortic and mitral valves placed.  He had SVG-PDA, Maze, and LA appendage clipping. - Echo (1/19): LVEF 30-35%, bioprosthetic MV and AoV function normally, RV mildly dilated with normal systolic function.  - TEE (5/19) with EF 25-30%, normal bioprosthetic aortic valve, normal bioprosthetic mitral valve. - Echo (6/19): EF 30-35%, mild LV dilation with diffuse hypokinesis, normal RV size with mildly decreased systolic function, normal-appearing bioprosthetic mitral and aortic valves.   - Echo (12/19): EF 50-55%, moderate LVH, bioprosthetic aortic valve with mean gradient 10 mmHg, normally-functioning bioprosthetic mitral valve.  - Echo (11/21): EF 50-55%, mild LVH, mildly dilated RV with normal function, bioprosthetic mitral valve with mean gradient 5  mmHg and no MR, bioprosthetic aortic valve with mean gradient 12 and no AI.   - TEE (7/22): EF 50-55%, septal HK, normal RV size/systolic function, LA appendage clipped, bioprosthetic aortic and mitral valves appeared normal.  12. Chronic systolic CHF 13. Deafness 14. CKD: stage 3.  15. 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  17. COPD: Mild obstruction on 9/18 PFTs.  - PFTs (10/20): Mild obstruction, decreased DLCO.  18. Post-op CHB in 12/18 with Medtronic dual chamber PPM.  19. Chronic systolic CHF: Echo (2/12) with EF down to 30-35% post-op.  - TEE (5/19) with EF 25-30%, normal bioprosthetic aortic valve, normal bioprosthetic mitral valve.  - Echo (12/19): EF 50-55%, moderate LVH, bioprosthetic aortic valve with mean gradient 10 mmHg, normally-functioning bioprosthetic mitral valve.  20. Atrial flutter: DCCV in 5/19.  21. OSA: Not using CPAP.  22. Interstitial lung disease: PFTs in 1/22 concerning for restriction.  High resolution chest CT in 2/22 with possible UIP.   SH: Quit smoking 8/18.  Married, 2 children, retired.    Family History  Problem Relation Age of Onset   Stroke Father  No details   Angina Mother    Kidney cancer Brother    Review of systems complete and found to be negative unless listed in HPI.    Current Outpatient Medications  Medication Sig Dispense Refill   acetaminophen (TYLENOL) 325 MG tablet Take 1 tablet (325 mg total) by mouth every 6 (six) hours as needed for mild pain. 30 tablet 0   amiodarone (PACERONE) 200 MG tablet Take 1 tablet (200 mg total) by mouth daily. 30 tablet 3   calcium carbonate (TUMS - DOSED IN MG ELEMENTAL CALCIUM) 500 MG chewable tablet Chew 1 tablet by mouth daily as needed for indigestion or heartburn.     carvedilol (COREG) 12.5 MG tablet Take 12.5 mg by mouth 3 (three) times daily as  needed.     cetirizine (ZYRTEC) 10 MG tablet Take 10 mg by mouth daily as needed for allergies.     citalopram (CELEXA) 20 MG tablet Take 1 tablet (20 mg total) by mouth at bedtime. 30 tablet 5   docusate sodium (COLACE) 100 MG capsule Take 1 capsule (100 mg total) by mouth 3 (three) times daily as needed. 20 capsule 0   empagliflozin (JARDIANCE) 10 MG TABS tablet TAKE 1 TABLET BY MOUTH ONCE A DAY BEFORE BREAKFAST 30 tablet 11   EPINEPHrine (EPIPEN JR) 0.15 MG/0.3ML injection Inject 0.15 mg into the muscle as needed for anaphylaxis.     fluticasone (FLONASE) 50 MCG/ACT nasal spray Place into both nostrils as needed for allergies or rhinitis.     furosemide (LASIX) 40 MG tablet Take 40 mg by mouth daily.     loratadine (CLARITIN) 10 MG tablet Take 10 mg by mouth daily.     methocarbamol (ROBAXIN) 500 MG tablet Take 1 tablet (500 mg total) by mouth every 8 (eight) hours as needed for muscle spasms. 30 tablet 0   pantoprazole (PROTONIX) 40 MG tablet Take 1 tablet (40 mg total) by mouth daily. 30 tablet 1   Pseudoeph-Doxylamine-DM-APAP (NYQUIL PO) Take 30 mLs by mouth at bedtime as needed (cold symptoms).     rosuvastatin (CRESTOR) 10 MG tablet TAKE 1 TABLET EVERY DAY 90 tablet 0   sacubitril-valsartan (ENTRESTO) 24-26 MG Take 1 tablet by mouth 2 (two) times daily. 60 tablet 3   spironolactone (ALDACTONE) 25 MG tablet TAKE 1 TABLET EVERY DAY 90 tablet 3   traZODone (DESYREL) 50 MG tablet Take 100 mg by mouth at bedtime.     warfarin (COUMADIN) 2.5 MG tablet Take 1/2 to 1 tablet by mouth once daily as directed by [facility] 90 tablet 1   No current facility-administered medications for this encounter.   Vitals:   06/22/21 1340  BP: 104/60  Pulse: 60  SpO2: 94%  Weight: 122.8 kg (270 lb 12.8 oz)   Wt Readings from Last 3 Encounters:  06/22/21 122.8 kg (270 lb 12.8 oz)  06/18/21 118.8 kg (262 lb)  06/11/21 120.6 kg (265 lb 12.8 oz)   General: NAD Neck: No JVD, no thyromegaly or thyroid  nodule.  Lungs: Clear to auscultation bilaterally with normal respiratory effort. CV: Nondisplaced PMI.  Heart regular S1/S2, no S3/S4, 2/6 early SEM RUSB.  No peripheral edema.  No carotid bruit.  Normal pedal pulses.  Abdomen: Soft, nontender, no hepatosplenomegaly, no distention.  Skin: Intact without lesions or rashes.  Neurologic: Alert and oriented x 3.  Psych: Normal affect. Extremities: No clubbing or cyanosis.  HEENT: Normal.   Assessment/Plan: 1. Valvular heart disease: TEE in 9/18 showed severe MR,  probably infarct-related with restrictive posterior mitral leaflet. There was moderate aortic stenosis and moderate-severe aortic insufficiency, possible rheumatic.  In 12/18, he had bioprosthetic MVR and AVR. Valves stable on TEE in 7/22. - Antibiotic prophylaxis needed with dental work.  2. Chronic systolic => diastolic CHF: In setting of significant valvular disease as above. EF down to 30-35% post-op (1/19 echo), likely reflects true LV systolic function without the volume load from severe MR and moderate-severe AI.  TEE in 5/19 showed EF 25-30%.  Repeat echo in 6/19 with EF 30-35%. Repeat echo in 12/19 showed EF up to 50-55%, echo in 11/21 again showed EF 50-55% with normal RV systolic function.  TEE in 7/22 showed EF 50-55% with normal RV.  He is not volume overloaded on exam.  NYHA class II-III symptoms, not volume overloaded on exam. Of note, creatinine recently up to 2.98.   - Continue Lasix 40 mg daily, BMET today.   - Continue Coreg 12.5 mg bid.   - Decrease Entresto to 24/26 bid with elevated creatinine and soft BP.   - Continue spironolactone 25 mg daily. - Continue Jardianc 10 mg daily.  3. CKD: Stage 3-4. Creatinine higher recently.  - As above, continue Jardiance and cut back Entresto.  4. Atrial fibrillation/flutter: Paroxysmal. He has had a Maze procedure.  He tends to tolerate atrial arrhythmias poorly.  He had atrial flutter with DCCV in 5/19.  Recurrent atrial  fibrillation in 2020.  Stopped amiodarone and went into atrial fibrillation in 6/22, very symptomatic.  Amiodarone restarted and he was cardioverted to NSR in 7/22.  He is in NSR today, feels much better.  - He is on warfarin.  - Continue amiodarone 200 mg daily for now, decrease to 100 mg daily at next appt if still in NSR.  Check LFTs, TSH today.  He will need an eye exam, discussed today.  CT chest showed possible early ILD, ESR was only mildly elevated.  He saw pulmonary, not definite amiodarone-induced lung toxicity but need to follow closely.  Will get PFTs later this year.   5. COPD: Mild obstruction on 10/20 PFTs, decreased DLCO.  He has quit smoking.   6. CVA: Remote, minimal residual. S/p carotid stenting.  - No ASA given warfarin use.  7. Hyperlipidemia: Continue Crestor.     8. Anemia: Fe deficiency.   9. 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  The artery to the PDA was occluded with left to right collaterals.  He had SVG-PDA in 12/18. No chest pain.  - Continue statin, good LDL in 11/21.    - No ASA given warfarin use.  10. CHB: has Medtronic PPM.  He a-paces a high percentage of the time but there is minimal RV pacing.  11. OSA: Severe OSA, he does not want to use CPAP.  12. Obesity: We discussed diet/exercise for weight loss.  13. HTN: BP now on the low side.  14. Interstitial lung disease: Restrictive PFTs in 1/22.  High resolution CT chest concerning for ILD, possibly usual interstitial pneumonitis.  He saw pulmonary, could not rule out amiodarone lung toxicity so tried him off amiodarone.  He quickly went back into atrial fibrillation that was tolerated poorly, so amiodarone restarted.  Will continue for now, decrease to 100 mg daily at next appointment.  - Will need to follow PFTs.  - Continue to followup  with pulmonary.   Followup in 6 wks.   Loralie Champagne 06/22/2021

## 2021-06-23 ENCOUNTER — Other Ambulatory Visit (HOSPITAL_COMMUNITY): Payer: Self-pay | Admitting: Cardiology

## 2021-06-23 ENCOUNTER — Other Ambulatory Visit (HOSPITAL_COMMUNITY): Payer: Self-pay

## 2021-06-23 MED ORDER — EMPAGLIFLOZIN 10 MG PO TABS
ORAL_TABLET | ORAL | 3 refills | Status: DC
  Filled 2021-06-23: qty 90, 90d supply, fill #0
  Filled 2021-09-27: qty 90, 90d supply, fill #1
  Filled 2021-12-14: qty 90, 90d supply, fill #2
  Filled 2022-03-28: qty 90, 90d supply, fill #3

## 2021-06-25 ENCOUNTER — Ambulatory Visit (INDEPENDENT_AMBULATORY_CARE_PROVIDER_SITE_OTHER): Payer: Medicare HMO | Admitting: Pharmacist

## 2021-06-25 ENCOUNTER — Other Ambulatory Visit: Payer: Self-pay

## 2021-06-25 DIAGNOSIS — Z951 Presence of aortocoronary bypass graft: Secondary | ICD-10-CM

## 2021-06-25 DIAGNOSIS — Z9889 Other specified postprocedural states: Secondary | ICD-10-CM

## 2021-06-25 DIAGNOSIS — I480 Paroxysmal atrial fibrillation: Secondary | ICD-10-CM

## 2021-06-25 DIAGNOSIS — Z8679 Personal history of other diseases of the circulatory system: Secondary | ICD-10-CM

## 2021-06-25 DIAGNOSIS — Z953 Presence of xenogenic heart valve: Secondary | ICD-10-CM | POA: Diagnosis not present

## 2021-06-25 DIAGNOSIS — Z5181 Encounter for therapeutic drug level monitoring: Secondary | ICD-10-CM | POA: Diagnosis not present

## 2021-06-25 LAB — POCT INR: INR: 3.7 — AB (ref 2.0–3.0)

## 2021-06-25 NOTE — Patient Instructions (Addendum)
Description   Hold dose today and then continue on same dosage Warfarin 1 tablet (2.'5mg'$  daily). Recheck INR in 1 week.  [facility] [phone].

## 2021-07-01 ENCOUNTER — Other Ambulatory Visit: Payer: Self-pay

## 2021-07-01 ENCOUNTER — Ambulatory Visit (INDEPENDENT_AMBULATORY_CARE_PROVIDER_SITE_OTHER): Payer: Medicare HMO | Admitting: *Deleted

## 2021-07-01 DIAGNOSIS — I480 Paroxysmal atrial fibrillation: Secondary | ICD-10-CM | POA: Diagnosis not present

## 2021-07-01 DIAGNOSIS — Z5181 Encounter for therapeutic drug level monitoring: Secondary | ICD-10-CM

## 2021-07-01 DIAGNOSIS — Z953 Presence of xenogenic heart valve: Secondary | ICD-10-CM

## 2021-07-01 DIAGNOSIS — Z9889 Other specified postprocedural states: Secondary | ICD-10-CM | POA: Diagnosis not present

## 2021-07-01 DIAGNOSIS — Z8679 Personal history of other diseases of the circulatory system: Secondary | ICD-10-CM | POA: Diagnosis not present

## 2021-07-01 DIAGNOSIS — Z951 Presence of aortocoronary bypass graft: Secondary | ICD-10-CM | POA: Diagnosis not present

## 2021-07-01 LAB — POCT INR: INR: 2.8 (ref 2.0–3.0)

## 2021-07-01 NOTE — Patient Instructions (Signed)
Description   Continue taking 1 tablet (2.'5mg'$ ) daily. Recheck INR in 3 weeks [facility] [phone].

## 2021-07-07 ENCOUNTER — Telehealth: Payer: Self-pay | Admitting: Pulmonary Disease

## 2021-07-07 NOTE — Telephone Encounter (Signed)
Called and spoke with patient's wife Katharine Look. She's on his DPR. She states he was put on a 5 day course of Prednisone. States he started it on 7/1. She states after completing the Prednisone patient did not have any congestion, he seemed better. States about 5 days ago he became congested again. He is still using Claritin and Flonase nasal spray. Katharine Look states she thought he may need another round of Prednisone to clear up congestion.  Patient is using [employer]  He took Prednisone 20 mg (2 tablets) QD x 5 days  Dr. Vaughan Browner please advise  Thank you

## 2021-07-08 ENCOUNTER — Other Ambulatory Visit (HOSPITAL_COMMUNITY): Payer: Self-pay | Admitting: *Deleted

## 2021-07-08 ENCOUNTER — Encounter (HOSPITAL_COMMUNITY): Payer: Self-pay

## 2021-07-08 MED ORDER — AMIODARONE HCL 200 MG PO TABS: 200.0000 mg | ORAL_TABLET | Freq: Every day | ORAL | 3 refills | Status: DC

## 2021-07-09 ENCOUNTER — Other Ambulatory Visit (HOSPITAL_COMMUNITY): Payer: Self-pay

## 2021-07-09 MED ORDER — PREDNISONE 20 MG PO TABS
40.0000 mg | ORAL_TABLET | Freq: Every day | ORAL | 0 refills | Status: DC
  Filled 2021-07-09: qty 10, 5d supply, fill #0

## 2021-07-09 NOTE — Telephone Encounter (Signed)
Please call in prednisone 40 mg a day for 5 days

## 2021-07-09 NOTE — Telephone Encounter (Signed)
Spoke with the pt's spouse and notified rx for pred sent to [place]  Nothing further needed

## 2021-07-13 ENCOUNTER — Other Ambulatory Visit: Payer: Self-pay

## 2021-07-13 ENCOUNTER — Ambulatory Visit (INDEPENDENT_AMBULATORY_CARE_PROVIDER_SITE_OTHER): Payer: Medicare HMO | Admitting: *Deleted

## 2021-07-13 DIAGNOSIS — Z5181 Encounter for therapeutic drug level monitoring: Secondary | ICD-10-CM

## 2021-07-13 DIAGNOSIS — Z953 Presence of xenogenic heart valve: Secondary | ICD-10-CM

## 2021-07-13 LAB — POCT INR: INR: 2.4 (ref 2.0–3.0)

## 2021-07-13 NOTE — Patient Instructions (Signed)
Description   Continue taking 1 tablet (2.'5mg'$ ) daily. Eat an extra serving of greens today because you are on the prednisone. Recheck INR in 3 weeks [facility] [phone].

## 2021-07-22 ENCOUNTER — Other Ambulatory Visit (HOSPITAL_COMMUNITY): Payer: Self-pay

## 2021-07-22 ENCOUNTER — Telehealth (HOSPITAL_COMMUNITY): Payer: Self-pay | Admitting: Pharmacy Technician

## 2021-07-22 NOTE — Telephone Encounter (Signed)
Advanced Heart Failure Patient Advocate Encounter  I received a notice from [employer] that the patient's HF grant would expire on 07/19/21 and had $542 remaining. Went ahead and renewed the patient's grant. The pharmacy information is the same and [facility] has it on file.   Member ID: [id number] Group ID: [id number] RxBin ID: [id number] PCN: [id number] Eligibility Start Date: 08/20/2021 Eligibility End Date: 08/19/2022 Assistance Amount: $1,000.00  Stella

## 2021-08-02 NOTE — Progress Notes (Signed)
Advanced Heart Failure Clinic Note   PCP: Dr. Justin Mend Cardiology: Dr. Radford Pax HF Cardiology: Dr. Aundra Dubin  75 y.o.with history of CVA, paroxysmal atrial fibrillation, valvular heart disease, and chronic diastolic CHF presents for evaluation of CHF and valvular heart disease.    He had had episodes of dyspnea and initial workup led to a TEE in 5/18 showing normal EF with moderate AS and moderate MR.  He continued to have episodic dyspnea and ended up admitted in 8/18 with shortness of breath and chest pressure.  This led to a long, complicated hospitalization.  He was noted to be volume overloaded with acute diastolic CHF and was also noted to have symptomatic runs of atrial fibrillation with RVR.  Amiodarone was started to control the atrial fibrillation.  He developed respiratory distress => Bipap => intubated.  He became hypotensive, requiring pressors.  He developed AKI.  PNA was noted and he received broad spectrum abx.  He had a prolonged intubation but was eventually extubated.  Given deconditioning from long hospitalization, he went to CIR for a couple of weeks.  LHC in 9/18 showed occluded PDA with left>right collaterals.  TEE in 9/18 showed EF 50%, severe MR (possibly infarct-related with restricted posterior leaflet, and moderate AS + moderate-severe AI (possibly rheumatic).   In 12/18, he had cardiac surgery with bioprosthetic aortic and mitral valves placed.  He had SVG-PDA, Maze, and LA appendage clipping.  Post-op course was complicated by CHB requiring Medtronic dual chamber PPM (His bundle lead placed but captured right bundle so induced LBBB).   Echo 01/02/18 LVEF 30-35%, bioprosthetic MV and AoV function normally, RV mildly dilated with normal systolic function.    He was noted to be in atrial flutter with mild RVR and had TEE-guided DCCV on 04/11/18. TEE showed EF 25-30%, normal bioprosthetic aortic and mitral valves.  Echo in 6/19 showed EF 30-35%, normal bioprosthetic aortic and mitral  valves.  Echo 12/19 with EF up to 50-55%, normal bioprosthetic mitral and aortic valves, normal RV.   In 2020, he went back into atrial fibrillation.  Amiodarone was started, and he returned to NSR.   Echo in 11/21 showed EF 50-55%, mild LVH, mildly dilated RV with normal function, bioprosthetic mitral valve with mean gradient 5 mmHg and no MR, bioprosthetic aortic valve with mean gradient 12 and no AI.  PFTs in 1/22 were restrictive.  High resolution CT chest was done in 2/22, concerning for ILD, usual interstitial pneumonitis.  He saw pulmonary, ESR was mildly elevated.  We decided to try him off amiodarone given concern for possible amiodarone-induced lung toxicity.  He then developed atrial fibrillation off amiodarone, was quite symptomatic.  He underwent TEE-guided DCCV back to NSR in 7/22 and amiodarone was restarted.  TEE showed EF 50-55%, mid septal HK, RV normal, bioprosthetic aortic and mitral valves appeared normal.    Today he returns for HF and valvular disease follow up. Main complaint is allergies. Gets short of breath walking up inclines but does ok on flat ground. Does not immediately notice when he is in atrial fibrillation but his wife says she can tell. Denies CP, dizziness, edema. Chronically sleeps in recliner. Appetite ok. No fever or chills. Weight at home 268 pounds. Taking all medications. Unable to tolerate CPAP.  ECG (personally reviewed): a-paced   Device Interrogation (personally reviewed): a-pacing 82%, last in AF 7/22.  Labs (9/18): K 4.5, creatinine 1.42, LDL 90, HDL 36, LFTs normal, TSH normal, hgb 9.6 Labs (10/18): K 4.3, creatinine 1.45 Labs (  12/18): K 3.8, creatinine 1.56 Labs (1/19): creatinine 1.68, LDL 66 Labs (3/19): K 4.2, creatinine 1.56 Labs (4/19): K 4.4, creatinine 1.71, hgb 11.8, LDL 75, HDL 43 Labs (6/19): K 4.8, creatinine 2.1, LDL 75, TGs 246 Labs (8/19): K 4.6, creatinine 2.07 Labs (11/19): K 4.6, creatinine 1.29 Labs (2/20): K 4.8, creatinine  1.98 Labs (5/20): LDL 49, HDL 39 Labs (7/20): TSH normal Labs (9/20): TSH normal, K 4.4, creatinine 2.03, hgb 13.5, LFTs normal Labs (12/20): K 4.4, creatinine 1.87, hgb 13.1.  Labs (11/21): LDL 54, K 4.6, creatinine 2.22, LFTs normal, TSH normal Labs (7/22): TSH normal, K 4.5, creatinine 2.98, hgb 13.7  PMH: 1. CVA: Right MCA, 2004.  Minimal residual problems.  2. HTN - Peripheral edema with amlodipine.  3. Hyperlipidemia 4. Left rotator cuff surgery 6/18 5. Carotid stenosis: s/p right carotid stent.  6. GERD 7. H/o CCY 8. Anemia: FOBT negative.  9. Gout  10. Atrial fibrillation: Paroxysmal. Maze in 12/18 with LAA clipping.  - Recurrent atrial fibrillation 2020 => amiodarone.  - Recurrent atrial fibrillation off amiodarone in 7/22, DCCV to NSR and amiodarone restarted.  11. Valvular heart disease: TEE (5/18) with EF 55-60%, moderate AS mean 33 mmHg, moderate MR, normal RV size and systolic function.  - Echo (8/18): EF 55-60%, moderate LVH, moderate AS with mean gradient 33 mmHg, moderate AI, moderate to severe MR.  - TEE (9/18): EF 50%, mild LV dilation, suspect severe MR with posterior leaflet restricted (?infarct-related MR), ERO 0.4 cm^2, moderate AS/moderate-severe AI (possibly rheumatic).   - In 12/18, he had cardiac surgery with bioprosthetic aortic and mitral valves placed.  He had SVG-PDA, Maze, and LA appendage clipping. - Echo (1/19): LVEF 30-35%, bioprosthetic MV and AoV function normally, RV mildly dilated with normal systolic function.  - TEE (5/19) with EF 25-30%, normal bioprosthetic aortic valve, normal bioprosthetic mitral valve. - Echo (6/19): EF 30-35%, mild LV dilation with diffuse hypokinesis, normal RV size with mildly decreased systolic function, normal-appearing bioprosthetic mitral and aortic valves.   - Echo (12/19): EF 50-55%, moderate LVH, bioprosthetic aortic valve with mean gradient 10 mmHg, normally-functioning bioprosthetic mitral valve.  - Echo (11/21):  EF 50-55%, mild LVH, mildly dilated RV with normal function, bioprosthetic mitral valve with mean gradient 5 mmHg and no MR, bioprosthetic aortic valve with mean gradient 12 and no AI.   - TEE (7/22): EF 50-55%, septal HK, normal RV size/systolic function, LA appendage clipped, bioprosthetic aortic and mitral valves appeared normal.  12. Chronic systolic CHF 13. Deafness 14. CKD: stage 3.  15. 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  17. COPD: Mild obstruction on 9/18 PFTs.  - PFTs (10/20): Mild obstruction, decreased DLCO.  18. Post-op CHB in 12/18 with Medtronic dual chamber PPM.  19. Chronic systolic CHF: Echo (9/50) with EF down to 30-35% post-op.  - TEE (5/19) with EF 25-30%, normal bioprosthetic aortic valve, normal bioprosthetic mitral valve.  - Echo (12/19): EF 50-55%, moderate LVH, bioprosthetic aortic valve with mean gradient 10 mmHg, normally-functioning bioprosthetic mitral valve.  20. Atrial flutter: DCCV in 5/19.  21. OSA: Not using CPAP.  22. Interstitial lung disease: PFTs in 1/22 concerning for restriction.  High resolution chest CT in 2/22 with possible UIP.   SH: Quit smoking 8/18.  Married, 2 children, retired.    Family History  Problem Relation Age of Onset  Stroke Father        No details   Angina Mother    Kidney cancer Brother    Review of systems complete and found to be negative unless listed in HPI.    Current Outpatient Medications  Medication Sig Dispense Refill   acetaminophen (TYLENOL) 325 MG tablet Take 1 tablet (325 mg total) by mouth every 6 (six) hours as needed for mild pain. 30 tablet 0   amiodarone (PACERONE) 200 MG tablet Take 1 tablet (200 mg total) by mouth daily. 90 tablet 3   calcium carbonate (TUMS - DOSED IN MG ELEMENTAL CALCIUM) 500 MG chewable tablet Chew 1 tablet by mouth daily as needed for  indigestion or heartburn.     carvedilol (COREG) 12.5 MG tablet Patient takes 1 tablet 3 times a day.     cetirizine (ZYRTEC) 10 MG tablet Take 10 mg by mouth daily as needed for allergies.     citalopram (CELEXA) 20 MG tablet Take 1 tablet (20 mg total) by mouth at bedtime. 30 tablet 5   docusate sodium (COLACE) 100 MG capsule Take 1 capsule (100 mg total) by mouth 3 (three) times daily as needed. 20 capsule 0   empagliflozin (JARDIANCE) 10 MG TABS tablet TAKE 1 TABLET BY MOUTH ONCE A DAY BEFORE BREAKFAST 30 tablet 11   EPINEPHrine (EPIPEN JR) 0.15 MG/0.3ML injection Inject 0.15 mg into the muscle as needed for anaphylaxis.     fluticasone (FLONASE) 50 MCG/ACT nasal spray Place into both nostrils as needed for allergies or rhinitis.     furosemide (LASIX) 40 MG tablet Take 40 mg by mouth daily.     loratadine (CLARITIN) 10 MG tablet Take 10 mg by mouth daily.     methocarbamol (ROBAXIN) 500 MG tablet Take 1 tablet (500 mg total) by mouth every 8 (eight) hours as needed for muscle spasms. 30 tablet 0   pantoprazole (PROTONIX) 40 MG tablet Take 1 tablet (40 mg total) by mouth daily. 30 tablet 1   Pseudoeph-Doxylamine-DM-APAP (NYQUIL PO) Take 30 mLs by mouth at bedtime as needed (cold symptoms).     rosuvastatin (CRESTOR) 10 MG tablet TAKE 1 TABLET EVERY DAY 90 tablet 0   sacubitril-valsartan (ENTRESTO) 24-26 MG Take 1 tablet by mouth 2 (two) times daily. 60 tablet 3   spironolactone (ALDACTONE) 25 MG tablet TAKE 1 TABLET EVERY DAY 90 tablet 3   traZODone (DESYREL) 50 MG tablet Take 100 mg by mouth at bedtime.     warfarin (COUMADIN) 2.5 MG tablet Take 1/2 to 1 tablet by mouth once daily as directed by Coumadin Clinic 90 tablet 1   No current facility-administered medications for this encounter.   BP 114/60   Pulse 60   Wt 124.2 kg (273 lb 12.8 oz)   SpO2 94%   BMI 38.19 kg/m   Wt Readings from Last 3 Encounters:  08/03/21 124.2 kg (273 lb 12.8 oz)  06/22/21 122.8 kg (270 lb 12.8 oz)   06/18/21 118.8 kg (262 lb)   General:  NAD. No resp difficulty. HEENT: HOH, bilateral hearing aids Neck: Supple. Thick neck. Carotids 2+ bilat; no bruits. No lymphadenopathy or thryomegaly appreciated. Cor: PMI nondisplaced. Regular rate & rhythm. No rubs, gallops, II/VI SEM RUSB Lungs: Clear Abdomen: Obese, nontender, nondistended. No hepatosplenomegaly. No bruits or masses. Good bowel sounds. Extremities: No cyanosis, clubbing, rash, 1-2+ LE edema Neuro: Alert & oriented x 3, cranial nerves grossly intact. Moves all 4 extremities w/o difficulty. Affect pleasant.  Assessment/Plan: 1. Valvular heart  disease: TEE in 9/18 showed severe MR, probably infarct-related with restrictive posterior mitral leaflet. There was moderate aortic stenosis and moderate-severe aortic insufficiency, possible rheumatic.  In 12/18, he had bioprosthetic MVR and AVR. Valves stable on TEE in 7/22. - Antibiotic prophylaxis needed with dental work.  2. Chronic systolic => diastolic CHF: In setting of significant valvular disease as above. EF down to 30-35% post-op (1/19 echo), likely reflects true LV systolic function without the volume load from severe MR and moderate-severe AI.  TEE in 5/19 showed EF 25-30%.  Repeat echo in 6/19 with EF 30-35%. Repeat echo in 12/19 showed EF up to 50-55%, echo in 11/21 again showed EF 50-55% with normal RV systolic function.  TEE in 7/22 showed EF 50-55% with normal RV.  He is not volume overloaded on exam.  NYHA class II-III symptoms, mildly volume overloaded on exam. Of note, creatinine recently up to 2.98.   - Increase Lasix to 60 mg daily, BMET today, repeat in 10 days.   - Continue Coreg 12.5 mg bid.  (Emphasized twice daily, had been taking tid). - Continue Entresto 24/26 bid (decreased due to elevated creatinine and soft BP).   - Continue spironolactone 25 mg daily. - Continue Jardiance 10 mg daily.  3. CKD: Stage 3-4. Creatinine higher recently.  - BMET today. 4. Atrial  fibrillation/flutter: Paroxysmal. He has had a Maze procedure.  He tends to tolerate atrial arrhythmias poorly.  He had atrial flutter with DCCV in 5/19.  Recurrent atrial fibrillation in 2020.  Stopped amiodarone and went into atrial fibrillation in 6/22, very symptomatic.  Amiodarone restarted and he was cardioverted to NSR in 7/22.  He is in NSR today, feels much better.  - He is on warfarin. INR followed by Coumadin Clinic. - Decrease amiodarone to 100 mg daily. Recent LFTs, TSH ok.  He will need an eye exam.  CT chest showed possible early ILD, ESR was only mildly elevated.  He saw pulmonary, not definite amiodarone-induced lung toxicity but need to follow closely.  Will get PFTs later this year.   5. COPD: Mild obstruction on 10/20 PFTs, decreased DLCO.  He has quit smoking.   6. CVA: Remote, minimal residual. S/p carotid stenting.  - No ASA given warfarin use.  7. Hyperlipidemia: Continue Crestor.     8. Anemia: Fe deficiency.   9. 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  The artery to the PDA was occluded with left to right collaterals.  He had SVG-PDA in 12/18. No chest pain.  - Continue statin, good LDL in 11/21.    - No ASA given warfarin use.  10. CHB: has Medtronic PPM.  He a-paces a high percentage of the time but there is minimal RV pacing.  11. OSA: Severe OSA, he does not want to use CPAP.  12. Obesity: Body mass index is 38.19 kg/m. We discussed diet/exercise for weight loss.  13. HTN: Controlled. 14. Interstitial lung disease: Restrictive PFTs in 1/22.  High resolution CT chest concerning for ILD, possibly usual interstitial pneumonitis.  He saw pulmonary, could not rule out amiodarone lung toxicity so tried him off amiodarone.  He quickly went back into atrial fibrillation that was tolerated poorly, so amiodarone restarted.  Decrease as above. - Will need to follow  PFTs.  - Continue to followup with pulmonary.   Followup with Dr. Aundra Dubin in  2 months.  Ulysses FNP 08/03/2021

## 2021-08-03 ENCOUNTER — Ambulatory Visit (HOSPITAL_COMMUNITY)
Admission: RE | Admit: 2021-08-03 | Discharge: 2021-08-03 | Disposition: A | Discharge status: Home / Self Care | Payer: Medicare HMO | Source: Ambulatory Visit | Attending: Family Medicine | Admitting: Family Medicine

## 2021-08-03 ENCOUNTER — Other Ambulatory Visit (HOSPITAL_COMMUNITY): Payer: Self-pay

## 2021-08-03 ENCOUNTER — Other Ambulatory Visit: Payer: Self-pay

## 2021-08-03 ENCOUNTER — Encounter (HOSPITAL_COMMUNITY): Payer: Self-pay

## 2021-08-03 ENCOUNTER — Ambulatory Visit (INDEPENDENT_AMBULATORY_CARE_PROVIDER_SITE_OTHER): Payer: Medicare HMO | Admitting: *Deleted

## 2021-08-03 VITALS — BP 114/60 | HR 60 | Wt 273.8 lb

## 2021-08-03 DIAGNOSIS — D509 Iron deficiency anemia, unspecified: Secondary | ICD-10-CM | POA: Insufficient documentation

## 2021-08-03 DIAGNOSIS — J849 Interstitial pulmonary disease, unspecified: Secondary | ICD-10-CM | POA: Insufficient documentation

## 2021-08-03 DIAGNOSIS — G4733 Obstructive sleep apnea (adult) (pediatric): Secondary | ICD-10-CM | POA: Insufficient documentation

## 2021-08-03 DIAGNOSIS — Z5181 Encounter for therapeutic drug level monitoring: Secondary | ICD-10-CM | POA: Diagnosis not present

## 2021-08-03 DIAGNOSIS — Z7901 Long term (current) use of anticoagulants: Secondary | ICD-10-CM | POA: Diagnosis not present

## 2021-08-03 DIAGNOSIS — Z950 Presence of cardiac pacemaker: Secondary | ICD-10-CM

## 2021-08-03 DIAGNOSIS — Z951 Presence of aortocoronary bypass graft: Secondary | ICD-10-CM

## 2021-08-03 DIAGNOSIS — I5022 Chronic systolic (congestive) heart failure: Secondary | ICD-10-CM | POA: Diagnosis not present

## 2021-08-03 DIAGNOSIS — Z8673 Personal history of transient ischemic attack (TIA), and cerebral infarction without residual deficits: Secondary | ICD-10-CM | POA: Diagnosis not present

## 2021-08-03 DIAGNOSIS — Z7984 Long term (current) use of oral hypoglycemic drugs: Secondary | ICD-10-CM | POA: Diagnosis not present

## 2021-08-03 DIAGNOSIS — I2510 Atherosclerotic heart disease of native coronary artery without angina pectoris: Secondary | ICD-10-CM | POA: Diagnosis not present

## 2021-08-03 DIAGNOSIS — I4892 Unspecified atrial flutter: Secondary | ICD-10-CM | POA: Insufficient documentation

## 2021-08-03 DIAGNOSIS — I480 Paroxysmal atrial fibrillation: Secondary | ICD-10-CM | POA: Diagnosis not present

## 2021-08-03 DIAGNOSIS — N184 Chronic kidney disease, stage 4 (severe): Secondary | ICD-10-CM | POA: Insufficient documentation

## 2021-08-03 DIAGNOSIS — J449 Chronic obstructive pulmonary disease, unspecified: Secondary | ICD-10-CM | POA: Diagnosis not present

## 2021-08-03 DIAGNOSIS — Z953 Presence of xenogenic heart valve: Secondary | ICD-10-CM

## 2021-08-03 DIAGNOSIS — N183 Chronic kidney disease, stage 3 unspecified: Secondary | ICD-10-CM

## 2021-08-03 DIAGNOSIS — I130 Hypertensive heart and chronic kidney disease with heart failure and stage 1 through stage 4 chronic kidney disease, or unspecified chronic kidney disease: Secondary | ICD-10-CM | POA: Insufficient documentation

## 2021-08-03 DIAGNOSIS — E785 Hyperlipidemia, unspecified: Secondary | ICD-10-CM | POA: Insufficient documentation

## 2021-08-03 DIAGNOSIS — I5042 Chronic combined systolic (congestive) and diastolic (congestive) heart failure: Secondary | ICD-10-CM | POA: Insufficient documentation

## 2021-08-03 DIAGNOSIS — I10 Essential (primary) hypertension: Secondary | ICD-10-CM

## 2021-08-03 DIAGNOSIS — I352 Nonrheumatic aortic (valve) stenosis with insufficiency: Secondary | ICD-10-CM | POA: Diagnosis not present

## 2021-08-03 DIAGNOSIS — Z8679 Personal history of other diseases of the circulatory system: Secondary | ICD-10-CM

## 2021-08-03 DIAGNOSIS — I442 Atrioventricular block, complete: Secondary | ICD-10-CM

## 2021-08-03 DIAGNOSIS — Z79899 Other long term (current) drug therapy: Secondary | ICD-10-CM | POA: Diagnosis not present

## 2021-08-03 DIAGNOSIS — Z6838 Body mass index (BMI) 38.0-38.9, adult: Secondary | ICD-10-CM | POA: Insufficient documentation

## 2021-08-03 DIAGNOSIS — E669 Obesity, unspecified: Secondary | ICD-10-CM | POA: Insufficient documentation

## 2021-08-03 DIAGNOSIS — Z87891 Personal history of nicotine dependence: Secondary | ICD-10-CM | POA: Insufficient documentation

## 2021-08-03 DIAGNOSIS — Z9889 Other specified postprocedural states: Secondary | ICD-10-CM

## 2021-08-03 LAB — BASIC METABOLIC PANEL
Anion gap: 8 (ref 5–15)
BUN: 34 mg/dL — ABNORMAL HIGH (ref 8–23)
CO2: 22 mmol/L (ref 22–32)
Calcium: 8.7 mg/dL — ABNORMAL LOW (ref 8.9–10.3)
Chloride: 106 mmol/L (ref 98–111)
Creatinine, Ser: 2.18 mg/dL — ABNORMAL HIGH (ref 0.61–1.24)
GFR, Estimated: 31 mL/min — ABNORMAL LOW (ref >60)
Glucose, Bld: 102 mg/dL — ABNORMAL HIGH (ref 70–99)
Potassium: 4 mmol/L (ref 3.5–5.1)
Sodium: 136 mmol/L (ref 135–145)

## 2021-08-03 LAB — POCT INR: INR: 2.7 (ref 2.0–3.0)

## 2021-08-03 LAB — BRAIN NATRIURETIC PEPTIDE: B Natriuretic Peptide: 113.8 pg/mL — ABNORMAL HIGH (ref 0.0–100.0)

## 2021-08-03 MED ORDER — CARVEDILOL 12.5 MG PO TABS: 12.5000 mg | ORAL_TABLET | Freq: Two times a day (BID) | ORAL | 3 refills | Status: DC

## 2021-08-03 MED ORDER — CARVEDILOL 12.5 MG PO TABS
12.5000 mg | ORAL_TABLET | Freq: Two times a day (BID) | ORAL | 0 refills | Status: DC
Start: 2021-08-03 — End: 2021-08-03

## 2021-08-03 MED ORDER — FUROSEMIDE 40 MG PO TABS: 60.0000 mg | ORAL_TABLET | Freq: Every day | ORAL | 3 refills | Status: DC

## 2021-08-03 MED ORDER — AMIODARONE HCL 200 MG PO TABS: 100.0000 mg | ORAL_TABLET | Freq: Every day | ORAL | 3 refills | Status: DC

## 2021-08-03 MED ORDER — FUROSEMIDE 40 MG PO TABS: 60.0000 mg | ORAL_TABLET | Freq: Every day | ORAL | 6 refills | Status: DC

## 2021-08-03 MED ORDER — CARVEDILOL 12.5 MG PO TABS
12.5000 mg | ORAL_TABLET | Freq: Two times a day (BID) | ORAL | 6 refills | Status: DC
Start: 2021-08-03 — End: 2021-08-03

## 2021-08-03 NOTE — Patient Instructions (Signed)
Description   Continue taking 1 tablet (2.'5mg'$ ) daily. Recheck INR in 4 weeks [facility] [phone].

## 2021-08-03 NOTE — Patient Instructions (Signed)
DECREASE Amiodarone to 100 mg (one half tab) daily INCREASE Lasix to 60 mg (one and one half tab) daily CHANGE Carvedilol to 12.5 mg, one tab twice a day  Labs today We will only contact you if something comes back abnormal or we need to make some changes. Otherwise no news is good news!  Labs needed in 7-10 days  Your physician recommends that you schedule a follow-up appointment in: 2 months with Dr Aundra Dubin  Do the following things EVERYDAY: Weigh yourself in the morning before breakfast. Write it down and keep it in a log. Take your medicines as prescribed Eat low salt foods--Limit salt (sodium) to 2000 mg per day.  Stay as active as you can everyday Limit all fluids for the day to less than 2 liters  At the [facility], you and your health needs are our priority. As part of our continuing mission to provide you with exceptional heart care, we have created designated Provider Care Teams. These Care Teams include your primary Cardiologist (physician) and Advanced Practice Providers (APPs- Physician Assistants and Nurse Practitioners) who all work together to provide you with the care you need, when you need it.   You may see any of the following providers on your designated Care Team at your next follow up: Dr Glori Bickers Dr Loralie Champagne Dr Patrice Paradise, NP Lyda Jester, [place] G[facility] Smart Audry Riles, PharmD   Please be sure to bring in all your medications bottles to every appointment.

## 2021-08-04 ENCOUNTER — Telehealth: Payer: Self-pay | Admitting: *Deleted

## 2021-08-04 NOTE — Telephone Encounter (Signed)
P's wife, Katharine Look, left a voicemail statin the pt did not report that he had some med changes. She states that his Amio was decreased to '100mg'$  daily, Lasix was increased to '60mg'$  daily, and Carvedilol 12.'5mg'$  was increased to twice a day. Returned a call to the wife and made her aware that the changes are okay and that since he has been on the Amio for a while and it it reduced we should not see too much of a change in his INR & that it would not be instantly, therefore, we will monitor for changes over time. She verbalized understanding and will call back if needed.

## 2021-08-09 ENCOUNTER — Other Ambulatory Visit (HOSPITAL_COMMUNITY): Payer: Self-pay

## 2021-08-09 DIAGNOSIS — I679 Cerebrovascular disease, unspecified: Secondary | ICD-10-CM | POA: Diagnosis not present

## 2021-08-09 DIAGNOSIS — I350 Nonrheumatic aortic (valve) stenosis: Secondary | ICD-10-CM | POA: Diagnosis not present

## 2021-08-09 DIAGNOSIS — R7303 Prediabetes: Secondary | ICD-10-CM | POA: Diagnosis not present

## 2021-08-09 DIAGNOSIS — I10 Essential (primary) hypertension: Secondary | ICD-10-CM | POA: Diagnosis not present

## 2021-08-09 DIAGNOSIS — Z0000 Encounter for general adult medical examination without abnormal findings: Secondary | ICD-10-CM | POA: Diagnosis not present

## 2021-08-09 DIAGNOSIS — I739 Peripheral vascular disease, unspecified: Secondary | ICD-10-CM | POA: Diagnosis not present

## 2021-08-09 DIAGNOSIS — I2510 Atherosclerotic heart disease of native coronary artery without angina pectoris: Secondary | ICD-10-CM | POA: Diagnosis not present

## 2021-08-09 DIAGNOSIS — E782 Mixed hyperlipidemia: Secondary | ICD-10-CM | POA: Diagnosis not present

## 2021-08-09 DIAGNOSIS — F3342 Major depressive disorder, recurrent, in full remission: Secondary | ICD-10-CM | POA: Diagnosis not present

## 2021-08-09 MED ORDER — AZELASTINE HCL 137 MCG/SPRAY NA SOLN
NASAL | 11 refills | Status: DC
  Filled 2021-08-09: qty 30, 30d supply, fill #0

## 2021-08-11 ENCOUNTER — Other Ambulatory Visit (HOSPITAL_COMMUNITY): Payer: Self-pay | Admitting: Cardiology

## 2021-08-11 ENCOUNTER — Other Ambulatory Visit: Payer: Self-pay

## 2021-08-11 ENCOUNTER — Ambulatory Visit (HOSPITAL_COMMUNITY)
Admission: RE | Admit: 2021-08-11 | Discharge: 2021-08-11 | Disposition: A | Discharge status: Home / Self Care | Payer: Medicare HMO | Source: Ambulatory Visit | Attending: Cardiology | Admitting: Cardiology

## 2021-08-11 DIAGNOSIS — I10 Essential (primary) hypertension: Secondary | ICD-10-CM | POA: Diagnosis not present

## 2021-08-11 LAB — BASIC METABOLIC PANEL
Anion gap: 10 (ref 5–15)
BUN: 37 mg/dL — ABNORMAL HIGH (ref 8–23)
CO2: 22 mmol/L (ref 22–32)
Calcium: 8.4 mg/dL — ABNORMAL LOW (ref 8.9–10.3)
Chloride: 105 mmol/L (ref 98–111)
Creatinine, Ser: 2.23 mg/dL — ABNORMAL HIGH (ref 0.61–1.24)
GFR, Estimated: 30 mL/min — ABNORMAL LOW (ref >60)
Glucose, Bld: 95 mg/dL (ref 70–99)
Potassium: 4.4 mmol/L (ref 3.5–5.1)
Sodium: 137 mmol/L (ref 135–145)

## 2021-08-11 NOTE — Progress Notes (Signed)
b

## 2021-08-31 ENCOUNTER — Other Ambulatory Visit: Payer: Self-pay

## 2021-08-31 ENCOUNTER — Telehealth: Payer: Self-pay

## 2021-08-31 ENCOUNTER — Ambulatory Visit (INDEPENDENT_AMBULATORY_CARE_PROVIDER_SITE_OTHER): Payer: Medicare HMO

## 2021-08-31 DIAGNOSIS — Z953 Presence of xenogenic heart valve: Secondary | ICD-10-CM

## 2021-08-31 DIAGNOSIS — Z5181 Encounter for therapeutic drug level monitoring: Secondary | ICD-10-CM

## 2021-08-31 LAB — POCT INR: INR: 1.8 — AB (ref 2.0–3.0)

## 2021-08-31 MED ORDER — AMIODARONE HCL 200 MG PO TABS: 200.0000 mg | ORAL_TABLET | Freq: Every day | ORAL | 3 refills | Status: DC

## 2021-08-31 NOTE — Patient Instructions (Addendum)
Description   Take 2 tablets today and then continue taking 1 tablet (2.'5mg'$ ) daily. Recheck INR in 3 weeks [facility]in Clinic [phone].

## 2021-08-31 NOTE — Telephone Encounter (Signed)
Per Adline Peals PA increase amiodarone to '200mg'$  once a day and follow up in 7-10 days. Appt made and wife made aware.

## 2021-08-31 NOTE — Telephone Encounter (Signed)
[facility] Care alert for AF, presenting rhythm appears flutter, CVR Pt underwent DCCV 7/7  Spoke with pt wife.  Advised it appears pt has been in AF since ~ 9:30pm last night.    She reports he really hasn't complained of specific symptoms, but she has noticed he is more fatigued today.  Pt currently at Coumadin clinic for his routine check.    She confirmed patient compliance with Amiodarone '100mg'$  daily (since dosage decrease on 8/23).    Advised I would forward to AF clinic for review/ recommendations.

## 2021-09-02 ENCOUNTER — Ambulatory Visit (INDEPENDENT_AMBULATORY_CARE_PROVIDER_SITE_OTHER): Payer: Medicare HMO

## 2021-09-02 DIAGNOSIS — I442 Atrioventricular block, complete: Secondary | ICD-10-CM

## 2021-09-02 LAB — CUP PACEART REMOTE DEVICE CHECK
Battery Remaining Longevity: 119 mo
Battery Voltage: 3.01 V
Brady Statistic AP VP Percent: 0.06 %
Brady Statistic AP VS Percent: 20.08 %
Brady Statistic AS VP Percent: 0.05 %
Brady Statistic AS VS Percent: 79.81 %
Brady Statistic RA Percent Paced: 19.83 %
Brady Statistic RV Percent Paced: 3.1 %
Date Time Interrogation Session: 20220921233810
Implantable Lead Implant Date: 20181211
Implantable Lead Implant Date: 20181211
Implantable Lead Location: 753859
Implantable Lead Location: 753860
Implantable Lead Model: 3830
Implantable Lead Model: 5076
Implantable Pulse Generator Implant Date: 20181211
Lead Channel Impedance Value: 304 Ohm
Lead Channel Impedance Value: 342 Ohm
Lead Channel Impedance Value: 399 Ohm
Lead Channel Impedance Value: 399 Ohm
Lead Channel Pacing Threshold Amplitude: 0.5 V
Lead Channel Pacing Threshold Amplitude: 1.125 V
Lead Channel Pacing Threshold Pulse Width: 0.4 ms
Lead Channel Pacing Threshold Pulse Width: 0.4 ms
Lead Channel Sensing Intrinsic Amplitude: 5.75 mV
Lead Channel Sensing Intrinsic Amplitude: 5.75 mV
Lead Channel Sensing Intrinsic Amplitude: 5.875 mV
Lead Channel Sensing Intrinsic Amplitude: 5.875 mV
Lead Channel Setting Pacing Amplitude: 2 V
Lead Channel Setting Pacing Amplitude: 2 V
Lead Channel Setting Pacing Pulse Width: 0.4 ms
Lead Channel Setting Sensing Sensitivity: 1.2 mV

## 2021-09-08 NOTE — Progress Notes (Signed)
Remote pacemaker transmission.   

## 2021-09-09 ENCOUNTER — Ambulatory Visit (HOSPITAL_COMMUNITY): Payer: Medicare HMO | Admitting: Physician Assistant

## 2021-09-09 ENCOUNTER — Telehealth (HOSPITAL_COMMUNITY): Payer: Self-pay | Admitting: *Deleted

## 2021-09-09 ENCOUNTER — Encounter (HOSPITAL_COMMUNITY): Payer: Self-pay

## 2021-09-09 NOTE — Telephone Encounter (Signed)
Per transmission today - pt is in NSR since 9/24. Appt not necessary. Per Adline Peals PA will continue on Amiodarone '200mg'$  once a day. Wife verbalized understanding of recommendations and to keep scheduled follow up with Dr. Aundra Dubin in October.

## 2021-09-21 ENCOUNTER — Ambulatory Visit (INDEPENDENT_AMBULATORY_CARE_PROVIDER_SITE_OTHER): Payer: Medicare HMO

## 2021-09-21 ENCOUNTER — Other Ambulatory Visit: Payer: Self-pay

## 2021-09-21 DIAGNOSIS — Z953 Presence of xenogenic heart valve: Secondary | ICD-10-CM

## 2021-09-21 DIAGNOSIS — I480 Paroxysmal atrial fibrillation: Secondary | ICD-10-CM

## 2021-09-21 DIAGNOSIS — Z5181 Encounter for therapeutic drug level monitoring: Secondary | ICD-10-CM | POA: Diagnosis not present

## 2021-09-21 DIAGNOSIS — Z951 Presence of aortocoronary bypass graft: Secondary | ICD-10-CM

## 2021-09-21 DIAGNOSIS — Z9889 Other specified postprocedural states: Secondary | ICD-10-CM | POA: Diagnosis not present

## 2021-09-21 DIAGNOSIS — Z8679 Personal history of other diseases of the circulatory system: Secondary | ICD-10-CM | POA: Diagnosis not present

## 2021-09-21 LAB — POCT INR: INR: 2.5 (ref 2.0–3.0)

## 2021-09-21 NOTE — Patient Instructions (Signed)
Description   Continue taking 1 tablet (2.'5mg'$ ) daily. Recheck INR in 4 weeks Cou[facility]in Clinic [phone].

## 2021-09-22 ENCOUNTER — Other Ambulatory Visit (HOSPITAL_COMMUNITY): Payer: Self-pay

## 2021-09-22 MED ORDER — ENTRESTO 24-26 MG PO TABS: 1.0000 | ORAL_TABLET | Freq: Two times a day (BID) | ORAL | 3 refills | Status: DC

## 2021-09-27 ENCOUNTER — Other Ambulatory Visit (HOSPITAL_COMMUNITY): Payer: Self-pay

## 2021-10-07 ENCOUNTER — Ambulatory Visit (HOSPITAL_COMMUNITY)
Admission: RE | Admit: 2021-10-07 | Discharge: 2021-10-07 | Disposition: A | Discharge status: Home / Self Care | Payer: Medicare HMO | Source: Ambulatory Visit | Attending: Cardiology | Admitting: Cardiology

## 2021-10-07 ENCOUNTER — Encounter (HOSPITAL_COMMUNITY): Payer: Self-pay | Admitting: Cardiology

## 2021-10-07 ENCOUNTER — Telehealth (HOSPITAL_COMMUNITY): Payer: Self-pay | Admitting: *Deleted

## 2021-10-07 ENCOUNTER — Other Ambulatory Visit: Payer: Self-pay

## 2021-10-07 VITALS — BP 158/68 | HR 61 | Wt 276.0 lb

## 2021-10-07 DIAGNOSIS — D509 Iron deficiency anemia, unspecified: Secondary | ICD-10-CM | POA: Insufficient documentation

## 2021-10-07 DIAGNOSIS — Z7901 Long term (current) use of anticoagulants: Secondary | ICD-10-CM | POA: Diagnosis not present

## 2021-10-07 DIAGNOSIS — I4819 Other persistent atrial fibrillation: Secondary | ICD-10-CM

## 2021-10-07 DIAGNOSIS — I2510 Atherosclerotic heart disease of native coronary artery without angina pectoris: Secondary | ICD-10-CM | POA: Insufficient documentation

## 2021-10-07 DIAGNOSIS — G4733 Obstructive sleep apnea (adult) (pediatric): Secondary | ICD-10-CM | POA: Insufficient documentation

## 2021-10-07 DIAGNOSIS — Z7984 Long term (current) use of oral hypoglycemic drugs: Secondary | ICD-10-CM | POA: Insufficient documentation

## 2021-10-07 DIAGNOSIS — Z87891 Personal history of nicotine dependence: Secondary | ICD-10-CM | POA: Diagnosis not present

## 2021-10-07 DIAGNOSIS — Z953 Presence of xenogenic heart valve: Secondary | ICD-10-CM | POA: Diagnosis not present

## 2021-10-07 DIAGNOSIS — I130 Hypertensive heart and chronic kidney disease with heart failure and stage 1 through stage 4 chronic kidney disease, or unspecified chronic kidney disease: Secondary | ICD-10-CM | POA: Insufficient documentation

## 2021-10-07 DIAGNOSIS — Z79899 Other long term (current) drug therapy: Secondary | ICD-10-CM | POA: Insufficient documentation

## 2021-10-07 DIAGNOSIS — J449 Chronic obstructive pulmonary disease, unspecified: Secondary | ICD-10-CM | POA: Insufficient documentation

## 2021-10-07 DIAGNOSIS — E669 Obesity, unspecified: Secondary | ICD-10-CM | POA: Insufficient documentation

## 2021-10-07 DIAGNOSIS — I480 Paroxysmal atrial fibrillation: Secondary | ICD-10-CM | POA: Insufficient documentation

## 2021-10-07 DIAGNOSIS — J441 Chronic obstructive pulmonary disease with (acute) exacerbation: Secondary | ICD-10-CM | POA: Diagnosis not present

## 2021-10-07 DIAGNOSIS — I5042 Chronic combined systolic (congestive) and diastolic (congestive) heart failure: Secondary | ICD-10-CM | POA: Diagnosis not present

## 2021-10-07 DIAGNOSIS — I5022 Chronic systolic (congestive) heart failure: Secondary | ICD-10-CM

## 2021-10-07 DIAGNOSIS — J849 Interstitial pulmonary disease, unspecified: Secondary | ICD-10-CM | POA: Insufficient documentation

## 2021-10-07 DIAGNOSIS — I4892 Unspecified atrial flutter: Secondary | ICD-10-CM | POA: Insufficient documentation

## 2021-10-07 DIAGNOSIS — N183 Chronic kidney disease, stage 3 unspecified: Secondary | ICD-10-CM | POA: Insufficient documentation

## 2021-10-07 DIAGNOSIS — Z8673 Personal history of transient ischemic attack (TIA), and cerebral infarction without residual deficits: Secondary | ICD-10-CM | POA: Insufficient documentation

## 2021-10-07 DIAGNOSIS — E785 Hyperlipidemia, unspecified: Secondary | ICD-10-CM | POA: Diagnosis not present

## 2021-10-07 DIAGNOSIS — Z951 Presence of aortocoronary bypass graft: Secondary | ICD-10-CM

## 2021-10-07 LAB — LIPID PANEL
Cholesterol: 125 mg/dL (ref 0–200)
HDL: 64 mg/dL (ref >40)
LDL Cholesterol: 44 mg/dL (ref 0–99)
Total CHOL/HDL Ratio: 2 RATIO
Triglycerides: 84 mg/dL (ref <150)
VLDL: 17 mg/dL (ref 0–40)

## 2021-10-07 LAB — TSH: TSH: 2.777 u[IU]/mL (ref 0.350–4.500)

## 2021-10-07 LAB — COMPREHENSIVE METABOLIC PANEL
ALT: 11 U/L (ref 0–44)
AST: 17 U/L (ref 15–41)
Albumin: 3.6 g/dL (ref 3.5–5.0)
Alkaline Phosphatase: 32 U/L — ABNORMAL LOW (ref 38–126)
Anion gap: 11 (ref 5–15)
BUN: 24 mg/dL — ABNORMAL HIGH (ref 8–23)
CO2: 25 mmol/L (ref 22–32)
Calcium: 8.9 mg/dL (ref 8.9–10.3)
Chloride: 100 mmol/L (ref 98–111)
Creatinine, Ser: 1.9 mg/dL — ABNORMAL HIGH (ref 0.61–1.24)
GFR, Estimated: 36 mL/min — ABNORMAL LOW (ref >60)
Glucose, Bld: 109 mg/dL — ABNORMAL HIGH (ref 70–99)
Potassium: 3.9 mmol/L (ref 3.5–5.1)
Sodium: 136 mmol/L (ref 135–145)
Total Bilirubin: 0.9 mg/dL (ref 0.3–1.2)
Total Protein: 7.5 g/dL (ref 6.5–8.1)

## 2021-10-07 MED ORDER — AMLODIPINE BESYLATE 5 MG PO TABS: 5.0000 mg | ORAL_TABLET | Freq: Every day | ORAL | 6 refills | Status: DC

## 2021-10-07 MED ORDER — ENTRESTO 49-51 MG PO TABS: 1.0000 | ORAL_TABLET | Freq: Two times a day (BID) | ORAL | 3 refills | Status: DC

## 2021-10-07 MED ORDER — FUROSEMIDE 40 MG PO TABS: 40.0000 mg | ORAL_TABLET | Freq: Every day | ORAL | 3 refills | Status: DC

## 2021-10-07 NOTE — Telephone Encounter (Signed)
Pts wife aware and agreeable with plan.  

## 2021-10-07 NOTE — Telephone Encounter (Signed)
Pts wife called stating pt is unable to take amlodipine because it caused terrible swelling in legs and feet in the past.   Routed to Dr.McLean

## 2021-10-07 NOTE — Telephone Encounter (Signed)
Increase Entresto to 49/51 bid and do not start amlodipine.  Decrease Lasix to 40 mg daily.  Needs BMET, however, in 1 week with increased Entresto.

## 2021-10-07 NOTE — Patient Instructions (Signed)
Start Amlodipine 5 mg Daily  Labs done today, your results will be available in MyChart, we will contact you for abnormal readings.  Your physician has recommended that you have a pulmonary function test. Pulmonary Function Tests are a group of tests that measure how well air moves in and out of your lungs.  You have been referred to [facility] for possible ablation for your a-fib, they will call you for an appointment  Your physician recommends that you schedule a follow-up appointment in: 3 months  If you have any questions or concerns before your next appointment please send [place] a message through [place] or call our office at [phone].    TO LEAVE A MESSAGE FOR THE NURSE SELECT OPTION 2, PLEASE LEAVE A MESSAGE INCLUDING: YOUR NAME DATE OF BIRTH CALL BACK NUMBER REASON FOR CALL**this is important as we prioritize the call backs  YOU WILL RECEIVE A CALL BACK THE SAME DAY AS LONG AS YOU CALL BEFORE 4:00 PM  At the [facility], you and your health needs are our priority. As part of our continuing mission to provide you with exceptional heart care, we have created designated Provider Care Teams. These Care Teams include your primary Cardiologist (physician) and Advanced Practice Providers (APPs- Physician Assistants and Nurse Practitioners) who all work together to provide you with the care you need, when you need it.   You may see any of the following providers on your designated Care Team at your next follow up: Dr Glori Bickers Dr Haynes Kerns, NP Lyda Jester, [place] [facility] [place], [place] Audry Riles, PharmD   Please be sure to bring in all your medications bottles to every appointment.

## 2021-10-08 NOTE — Progress Notes (Signed)
Advanced Heart Failure Clinic Note   PCP: Dr. Justin Mend Cardiology: Dr. Radford Pax HF Cardiology: Dr. Aundra Dubin  75 y.o.with history of CVA, paroxysmal atrial fibrillation, valvular heart disease, and chronic diastolic CHF presents for evaluation of CHF and valvular heart disease.    He had had episodes of dyspnea and initial workup led to a TEE in 5/18 showing normal EF with moderate AS and moderate MR.  He continued to have episodic dyspnea and ended up admitted in 8/18 with shortness of breath and chest pressure.  This led to a long, complicated hospitalization.  He was noted to be volume overloaded with acute diastolic CHF and was also noted to have symptomatic runs of atrial fibrillation with RVR.  Amiodarone was started to control the atrial fibrillation.  He developed respiratory distress => Bipap => intubated.  He became hypotensive, requiring pressors.  He developed AKI.  PNA was noted and he received broad spectrum abx.  He had a prolonged intubation but was eventually extubated.  Given deconditioning from long hospitalization, he went to CIR for a couple of weeks.  LHC in 9/18 showed occluded PDA with left>right collaterals.  TEE in 9/18 showed EF 50%, severe MR (possibly infarct-related with restricted posterior leaflet, and moderate AS + moderate-severe AI (possibly rheumatic).   In 12/18, he had cardiac surgery with bioprosthetic aortic and mitral valves placed.  He had SVG-PDA, Maze, and LA appendage clipping.  Post-op course was complicated by CHB requiring Medtronic dual chamber PPM (His bundle lead placed but captured right bundle so induced LBBB).   Echo 01/02/18 LVEF 30-35%, bioprosthetic MV and AoV function normally, RV mildly dilated with normal systolic function.    He was noted to be in atrial flutter with mild RVR and had TEE-guided DCCV on 04/11/18. TEE showed EF 25-30%, normal bioprosthetic aortic and mitral valves.  Echo in 6/19 showed EF 30-35%, normal bioprosthetic aortic and mitral  valves.  Echo 12/19 with EF up to 50-55%, normal bioprosthetic mitral and aortic valves, normal RV.   In 2020, he went back into atrial fibrillation.  Amiodarone was started, and he returned to NSR.   Echo in 11/21 showed EF 50-55%, mild LVH, mildly dilated RV with normal function, bioprosthetic mitral valve with mean gradient 5 mmHg and no MR, bioprosthetic aortic valve with mean gradient 12 and no AI.  PFTs in 1/22 were restrictive.  High resolution CT chest was done in 2/22, concerning for ILD, usual interstitial pneumonitis.  He saw pulmonary, ESR was mildly elevated.  We decided to try him off amiodarone given concern for possible amiodarone-induced lung toxicity.  He then developed atrial fibrillation off amiodarone, was quite symptomatic.  He underwent TEE-guided DCCV back to NSR in 7/22 and amiodarone was restarted.  TEE showed EF 50-55%, mid septal HK, RV normal, bioprosthetic aortic and mitral valves appeared normal.   He has had runs of atrial fibrillation, and amiodarone has been increased back to 200 mg daily.   He returns today for followup of CHF and valvular disease.  He is a-paced today.  Weight is up 3 lbs.  No chest pain.  No dyspnea on flat ground or walking up 1 flight of stairs.  No orthopnea/PND.  No lightheadedness.  BP is elevated. He has pulmonary followup in 12/22.   ECG (personally reviewed): a-paced with long AV delay.   Labs (9/18): K 4.5, creatinine 1.42, LDL 90, HDL 36, LFTs normal, TSH normal, hgb 9.6 Labs (10/18): K 4.3, creatinine 1.45 Labs (12/18): K 3.8, creatinine  1.56 Labs (1/19): creatinine 1.68, LDL 66 Labs (3/19): K 4.2, creatinine 1.56 Labs (4/19): K 4.4, creatinine 1.71, hgb 11.8, LDL 75, HDL 43 Labs (6/19): K 4.8, creatinine 2.1, LDL 75, TGs 246 Labs (8/19): K 4.6, creatinine 2.07 Labs (11/19): K 4.6, creatinine 1.29 Labs (2/20): K 4.8, creatinine 1.98 Labs (5/20): LDL 49, HDL 39 Labs (7/20): TSH normal Labs (9/20): TSH normal, K 4.4, creatinine  2.03, hgb 13.5, LFTs normal Labs (12/20): K 4.4, creatinine 1.87, hgb 13.1.  Labs (11/21): LDL 54, K 4.6, creatinine 2.22, LFTs normal, TSH normal Labs (7/22): TSH normal, K 4.5, creatinine 2.98, hgb 13.7 Labs (8/22): K 4.4, creatinine 2.23  PMH: 1. CVA: Right MCA, 2004.  Minimal residual problems.  2. HTN - Peripheral edema with amlodipine.  3. Hyperlipidemia 4. Left rotator cuff surgery 6/18 5. Carotid stenosis: s/p right carotid stent.  6. GERD 7. H/o CCY 8. Anemia: FOBT negative.  9. Gout  10. Atrial fibrillation: Paroxysmal. Maze in 12/18 with LAA clipping.  - Recurrent atrial fibrillation 2020 => amiodarone.  - Recurrent atrial fibrillation off amiodarone in 7/22, DCCV to NSR and amiodarone restarted.  11. Valvular heart disease: TEE (5/18) with EF 55-60%, moderate AS mean 33 mmHg, moderate MR, normal RV size and systolic function.  - Echo (8/18): EF 55-60%, moderate LVH, moderate AS with mean gradient 33 mmHg, moderate AI, moderate to severe MR.  - TEE (9/18): EF 50%, mild LV dilation, suspect severe MR with posterior leaflet restricted (?infarct-related MR), ERO 0.4 cm^2, moderate AS/moderate-severe AI (possibly rheumatic).   - In 12/18, he had cardiac surgery with bioprosthetic aortic and mitral valves placed.  He had SVG-PDA, Maze, and LA appendage clipping. - Echo (1/19): LVEF 30-35%, bioprosthetic MV and AoV function normally, RV mildly dilated with normal systolic function.  - TEE (5/19) with EF 25-30%, normal bioprosthetic aortic valve, normal bioprosthetic mitral valve. - Echo (6/19): EF 30-35%, mild LV dilation with diffuse hypokinesis, normal RV size with mildly decreased systolic function, normal-appearing bioprosthetic mitral and aortic valves.   - Echo (12/19): EF 50-55%, moderate LVH, bioprosthetic aortic valve with mean gradient 10 mmHg, normally-functioning bioprosthetic mitral valve.  - Echo (11/21): EF 50-55%, mild LVH, mildly dilated RV with normal function,  bioprosthetic mitral valve with mean gradient 5 mmHg and no MR, bioprosthetic aortic valve with mean gradient 12 and no AI.   - TEE (7/22): EF 50-55%, septal HK, normal RV size/systolic function, LA appendage clipped, bioprosthetic aortic and mitral valves appeared normal.  12. Chronic systolic CHF 13. Deafness 14. CKD: stage 3.  15. 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  17. COPD: Mild obstruction on 9/18 PFTs.  - PFTs (10/20): Mild obstruction, decreased DLCO.  18. Post-op CHB in 12/18 with Medtronic dual chamber PPM.  19. Chronic systolic CHF: Echo (3/55) with EF down to 30-35% post-op.  - TEE (5/19) with EF 25-30%, normal bioprosthetic aortic valve, normal bioprosthetic mitral valve.  - Echo (12/19): EF 50-55%, moderate LVH, bioprosthetic aortic valve with mean gradient 10 mmHg, normally-functioning bioprosthetic mitral valve.  20. Atrial flutter: DCCV in 5/19.  21. OSA: Not using CPAP.  22. Interstitial lung disease: PFTs in 1/22 concerning for restriction.  High resolution chest CT in 2/22 with possible UIP.   SH: Quit smoking 8/18.  Married, 2 children, retired.    Family History  Problem Relation Age  of Onset   Stroke Father        No details   Angina Mother    Kidney cancer Brother    Review of systems complete and found to be negative unless listed in HPI.    Current Outpatient Medications  Medication Sig Dispense Refill   acetaminophen (TYLENOL) 325 MG tablet Take 1 tablet (325 mg total) by mouth every 6 (six) hours as needed for mild pain. 30 tablet 0   amiodarone (PACERONE) 200 MG tablet Take 1 tablet (200 mg total) by mouth daily. 45 tablet 3   Azelastine HCl 137 MCG/SPRAY SOLN Squirt 1 puff in each nostril twice a day. 30 mL 11   calcium carbonate (TUMS - DOSED IN MG ELEMENTAL CALCIUM) 500 MG chewable tablet Chew 1 tablet by  mouth daily as needed for indigestion or heartburn.     carvedilol (COREG) 12.5 MG tablet Take 1 tablet (12.5 mg total) by mouth 2 (two) times daily with a meal. 180 tablet 3   cetirizine (ZYRTEC) 10 MG tablet Take 10 mg by mouth daily as needed for allergies.     citalopram (CELEXA) 20 MG tablet Take 1 tablet (20 mg total) by mouth at bedtime. 30 tablet 5   docusate sodium (COLACE) 100 MG capsule Take 1 capsule (100 mg total) by mouth 3 (three) times daily as needed. 20 capsule 0   empagliflozin (JARDIANCE) 10 MG TABS tablet TAKE 1 TABLET BY MOUTH ONCE A DAY BEFORE BREAKFAST 30 tablet 11   empagliflozin (JARDIANCE) 10 MG TABS tablet TAKE 1 TABLET BY MOUTH ONCE A DAY BEFORE BREAKFAST 90 tablet 3   EPINEPHrine (EPIPEN JR) 0.15 MG/0.3ML injection Inject 0.15 mg into the muscle as needed for anaphylaxis.     fluticasone (FLONASE) 50 MCG/ACT nasal spray Place into both nostrils as needed for allergies or rhinitis.     loratadine (CLARITIN) 10 MG tablet Take 10 mg by mouth daily.     methocarbamol (ROBAXIN) 500 MG tablet Take 1 tablet (500 mg total) by mouth every 8 (eight) hours as needed for muscle spasms. 30 tablet 0   pantoprazole (PROTONIX) 40 MG tablet Take 1 tablet (40 mg total) by mouth daily. 30 tablet 1   Pseudoeph-Doxylamine-DM-APAP (NYQUIL PO) Take 30 mLs by mouth at bedtime as needed (cold symptoms).     rosuvastatin (CRESTOR) 10 MG tablet TAKE 1 TABLET EVERY DAY 90 tablet 0   spironolactone (ALDACTONE) 25 MG tablet TAKE 1 TABLET EVERY DAY 90 tablet 3   traZODone (DESYREL) 50 MG tablet Take 100 mg by mouth at bedtime.     warfarin (COUMADIN) 2.5 MG tablet Take 1/2 to 1 tablet by mouth once daily as directed by [facility] 90 tablet 1   furosemide (LASIX) 40 MG tablet Take 1 tablet (40 mg total) by mouth daily. 135 tablet 3   sacubitril-valsartan (ENTRESTO) 49-51 MG Take 1 tablet by mouth 2 (two) times daily. 60 tablet 3   No current facility-administered medications for this  encounter.   Vitals:   10/07/21 1141  BP: (!) 158/68  Pulse: 61  SpO2: 92%  Weight: 125.2 kg (276 lb)   Wt Readings from Last 3 Encounters:  10/07/21 125.2 kg (276 lb)  08/03/21 124.2 kg (273 lb 12.8 oz)  06/22/21 122.8 kg (270 lb 12.8 oz)   General: NAD Neck: No JVD, no thyromegaly or thyroid nodule.  Lungs: Clear to auscultation bilaterally with normal respiratory effort. CV: Nondisplaced PMI.  Heart regular S1/S2, no S3/S4, 2/6 early  SEM RUSB.  No peripheral edema.  No carotid bruit.  Normal pedal pulses.  Abdomen: Soft, nontender, no hepatosplenomegaly, no distention.  Skin: Intact without lesions or rashes.  Neurologic: Alert and oriented x 3.  Psych: Normal affect. Extremities: No clubbing or cyanosis.  HEENT: Normal.   Assessment/Plan: 1. Valvular heart disease: TEE in 9/18 showed severe MR, probably infarct-related with restrictive posterior mitral leaflet. There was moderate aortic stenosis and moderate-severe aortic insufficiency, possible rheumatic.  In 12/18, he had bioprosthetic MVR and AVR. Valves stable on TEE in 7/22. - Antibiotic prophylaxis needed with dental work.  2. Chronic systolic => diastolic CHF: In setting of significant valvular disease as above. EF down to 30-35% post-op (1/19 echo), likely reflects true LV systolic function without the volume load from severe MR and moderate-severe AI.  TEE in 5/19 showed EF 25-30%.  Repeat echo in 6/19 with EF 30-35%. Repeat echo in 12/19 showed EF up to 50-55%, echo in 11/21 again showed EF 50-55% with normal RV systolic function.  TEE in 7/22 showed EF 50-55% with normal RV.  He is not volume overloaded on exam.  NYHA class II symptoms, not volume overloaded on exam. Complicated by CKD stage 3.   - With elevated BP, increase Entresto to 49/51 bid and decrease Lasix to 40 mg daily.  BMET today and in 10 days.    - Continue Coreg 12.5 mg bid.   - Continue spironolactone 25 mg daily. - Continue Jardiance 10 mg daily.  3.  CKD: Stage 3.  - As above, continue Jardiance.  4. Atrial fibrillation/flutter: Paroxysmal. He has had a Maze procedure.  He tends to tolerate atrial arrhythmias poorly.  He had atrial flutter with DCCV in 5/19.  Recurrent atrial fibrillation in 2020.  Stopped amiodarone and went into atrial fibrillation in 6/22, very symptomatic.  Amiodarone restarted and he was cardioverted to NSR in 7/22.  Amiodarone was subsequently increased to 200 mg daily with transient AF runs.  - He is on warfarin.  - Continue amiodarone 200 mg daily for now.  Check LFTs, TSH today.  He will need an eye exam, discussed today.  CT chest in past showed possible early ILD, ESR was only mildly elevated.  He saw pulmonary, not definite amiodarone-induced lung toxicity but need to follow closely.  I will arrange for PFTs and he has followup with pulmonary.  - I would prefer to get him off amiodarone, but AF will likely recur and he tolerates poorly.  I will send back to EP to evaluate for atrial fibrillation ablation which hopefully would allow [place] to get him off amiodarone.  5. COPD: Mild obstruction on 10/20 PFTs, decreased DLCO.  He has quit smoking.   - Repeat PFTs, as above.  - Follows with pulmonary.  6. CVA: Remote, minimal residual. S/p carotid stenting.  - No ASA given warfarin use.  7. Hyperlipidemia: Continue Crestor.   - Check lipids today.    8. Anemia: Fe deficiency.   9. 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  The artery to the PDA was occluded with left to right collaterals.  He had SVG-PDA in 12/18. No chest pain.  - Continue statin, check lipids today.     - No ASA given warfarin use.  10. CHB: has Medtronic PPM.  He a-paces a high percentage of the time but there is  minimal RV pacing.  11. OSA: Severe OSA, he does not want to use CPAP.  12. Obesity: We discussed diet/exercise for weight loss.   13. HTN: BP now on the low side.  14. Interstitial lung disease: Restrictive PFTs in 1/22.  High resolution CT chest concerning for ILD, possibly usual interstitial pneumonitis.  He saw pulmonary, could not rule out amiodarone lung toxicity so tried him off amiodarone.  He quickly went back into atrial fibrillation that was tolerated poorly, so amiodarone restarted.  Will continue for now.  - Will need to follow PFTs.  - Continue to followup with pulmonary.  - As above, aim for AF ablation so we can stop amiodarone safely in the future.   Followup in 3 months.   Loralie Champagne 10/08/2021

## 2021-10-11 ENCOUNTER — Other Ambulatory Visit: Payer: Self-pay | Admitting: Cardiology

## 2021-10-12 ENCOUNTER — Ambulatory Visit: Payer: Medicare HMO | Admitting: Cardiology

## 2021-10-12 ENCOUNTER — Encounter: Payer: Self-pay | Admitting: Cardiology

## 2021-10-12 ENCOUNTER — Other Ambulatory Visit: Payer: Self-pay

## 2021-10-12 VITALS — BP 118/68 | HR 61 | Resp 18 | Ht 71.0 in | Wt 276.0 lb

## 2021-10-12 DIAGNOSIS — I442 Atrioventricular block, complete: Secondary | ICD-10-CM

## 2021-10-12 DIAGNOSIS — I4819 Other persistent atrial fibrillation: Secondary | ICD-10-CM

## 2021-10-12 DIAGNOSIS — I129 Hypertensive chronic kidney disease with stage 1 through stage 4 chronic kidney disease, or unspecified chronic kidney disease: Secondary | ICD-10-CM | POA: Diagnosis not present

## 2021-10-12 DIAGNOSIS — N183 Chronic kidney disease, stage 3 unspecified: Secondary | ICD-10-CM | POA: Diagnosis not present

## 2021-10-12 DIAGNOSIS — Z01818 Encounter for other preprocedural examination: Secondary | ICD-10-CM | POA: Diagnosis not present

## 2021-10-12 DIAGNOSIS — Z01812 Encounter for preprocedural laboratory examination: Secondary | ICD-10-CM

## 2021-10-12 DIAGNOSIS — I480 Paroxysmal atrial fibrillation: Secondary | ICD-10-CM | POA: Diagnosis not present

## 2021-10-12 LAB — SARS CORONAVIRUS 2 (TAT 6-24 HRS): SARS Coronavirus 2: NEGATIVE

## 2021-10-12 NOTE — Patient Instructions (Addendum)
Medication Instructions:  Your physician recommends that you continue on your current medications as directed. Please refer to the Current Medication list given to you today.  *If you need a refill on your cardiac medications before your next appointment, please call your pharmacy*   Lab Work: Pre procedure labs 11/22/21  :  BMP & CBC  If you have labs (blood work) drawn today and your tests are completely normal, you will receive your results only by: [place] (if you have MyChart) OR A paper copy in the mail If you have any lab test that is abnormal or we need to change your treatment, we will call you to review the results.   Testing/Procedures: Your physician has requested that you have cardiac CT within 7 days PRIOR to your ablation. Cardiac computed tomography (CT) is a painless test that uses an x-ray machine to take clear, detailed pictures of your heart.  Please follow instruction below located under "other instructions". You will get a call from our office to schedule the date for this test.  Your physician has recommended that you have an ablation. Catheter ablation is a medical procedure used to treat some cardiac arrhythmias (irregular heartbeats). During catheter ablation, a long, thin, flexible tube is put into a blood vessel in your groin (upper thigh), or neck. This tube is called an ablation catheter. It is then guided to your heart through the blood vessel. Radio frequency waves destroy small areas of heart tissue where abnormal heartbeats may cause an arrhythmia to start. Please follow instruction below located under "other instructions".   Follow-Up: At [facility], you and your health needs are our priority.  As part of our continuing mission to provide you with exceptional heart care, we have created designated Provider Care Teams.  These Care Teams include your primary Cardiologist (physician) and Advanced Practice Providers (APPs -  Physician Assistants and  Nurse Practitioners) who all work together to provide you with the care you need, when you need it.  Your next appointment:   1 month(s) after your ablation  The format for your next appointment:   In Person  Provider:   AFib clinic   Thank you for choosing [facility]!!   Trinidad Curet, RN [phone]    Other Instructions   CT INSTRUCTIONS Your cardiac CT will be scheduled at:  [facility] [address] [place], [place] [address] [phone]  Please arrive at the [facility] main entrance of [facility] 30 minutes prior to test start time. Proceed to the [facility] Radiology Department (first floor) to check-in and test prep.   Please follow these instructions carefully (unless otherwise directed):  Hold all erectile dysfunction medications at least 3 days (72 hrs) prior to test.  On the Night Before the Test: Be sure to Drink plenty of water. Do not consume any caffeinated/decaffeinated beverages or chocolate 12 hours prior to your test. Do not take any antihistamines 12 hours prior to your test.    On the Day of the Test: Drink plenty of water until 1 hour prior to the test. Do not eat any food 4 hours prior to the test. You may take your regular medications prior to the test.  Take an extra dose of Carvedilol 2 hours prior to this testing.  Take 25 mg total HOLD Furosemide/Hydrochlorothiazide morning of the test.       After the Test: Drink plenty of water. After receiving IV contrast, you may experience a mild  flushed feeling. This is normal. On occasion, you may experience a mild rash up to 24 hours after the test. This is not dangerous. If this occurs, you can take Benadryl 25 mg and increase your fluid intake. If you experience trouble breathing, this can be serious. If it is severe call 911 IMMEDIATELY. If it is mild, please call our office. If you take any of these medications: Glipizide/Metformin, Avandament,  Glucavance, please do not take 48 hours after completing test unless otherwise instructed.   Once we have confirmed authorization from your insurance company, we will call you to set up a date and time for your test. Based on how quickly your insurance processes prior authorizations requests, please allow up to 4 weeks to be contacted for scheduling your Cardiac CT appointment. Be advised that routine Cardiac CT appointments could be scheduled as many as 8 weeks after your provider has ordered it.  For non-scheduling related questions, please contact the cardiac imaging nurse navigator should you have any questions/concerns: Marchia Bond, Cardiac Imaging Nurse Navigator Gordy Clement, Cardiac Imaging Nurse Navigator [facility] Direct Office Dial: [phone]   For scheduling needs, including cancellations and rescheduling, please call [place], [phone].       Electrophysiology/Ablation Procedure Instructions   You are scheduled for a(n)  ablation on 12/16/2021 with Dr. Allegra Lai.   1.   Pre procedure testing-             A.  LAB WORK --- On 11/22/21 for your pre procedure blood work.  You do NOT need to be fasting.  You can stop by the [facility] office anytime between 7:30 am - 4:30 pm.   On the day of your procedure 12/16/2021 you will go to [facility] hospital ([address]) at 5:30 am.  Dennis Bast will go to the main entrance A [employer]) and enter where the [employer] are.  Your driver will drop you off and you will head down the hallway to ADMITTING.  You may have one support person come in to the hospital with you.  They will be asked to wait in the waiting room. It is OK to have someone drop you off and come back when you are ready to be discharged.   3.   Do not eat or drink after midnight prior to your procedure.   4.   On the morning of your procedure do NOT take any medication. Do not miss any doses of your blood thinner prior to  the morning of your procedure or your procedure will need to be rescheduled.   5.  Plan for an overnight stay but you may be discharged after your procedure, if you use your phone frequently bring your phone charger. If you are discharged after your procedure you will need someone to drive you home and be with you for 24 hours after your procedure.   6. You will follow up with the AFIB clinic 4 weeks after your procedure.  You will follow up with Dr. Curt Bears  3 months after your procedure.  These appointments will be made for you.   7. FYI: For your safety, and to allow [place] to monitor your vital signs accurately during the surgery/procedure we request that if you have artificial nails, gel coating, SNS etc. Please have those removed prior to your surgery/procedure. Not having the nail coverings /polish removed may result in cancellation or delay of your surgery/procedure.  * If you have ANY  questions please call the office [phone] and ask for Jago Carton RN or send me a MyChart message   * Occasionally, EP Studies and ablations can become lengthy.  Please make your family aware of this before your procedure starts.  Average time ranges from 2-8 hours for EP studies/ablations.  Your physician will call your family after the procedure with the results.                                    Cardiac Ablation Cardiac ablation is a procedure to destroy (ablate) some heart tissue that is sending bad signals. These bad signals cause problems in heart rhythm. The heart has many areas that make these signals. If there are problems in these areas, they can make the heart beat in a way that is not normal. Destroying some tissues can help make the heart rhythm normal. Tell your doctor about: Any allergies you have. All medicines you are taking. These include vitamins, herbs, eye drops, creams, and over-the-counter medicines. Any problems you or family members have had with medicines that make you fall asleep  (anesthetics). Any blood disorders you have. Any surgeries you have had. Any medical conditions you have, such as kidney failure. Whether you are pregnant or may be pregnant. What are the risks? This is a safe procedure. But problems may occur, including: Infection. Bruising and bleeding. Bleeding into the chest. Stroke or blood clots. Damage to nearby areas of your body. Allergies to medicines or dyes. The need for a pacemaker if the normal system is damaged. Failure of the procedure to treat the problem. What happens before the procedure? Medicines Ask your doctor about: Changing or stopping your normal medicines. This is important. Taking aspirin and ibuprofen. Do not take these medicines unless your doctor tells you to take them. Taking other medicines, vitamins, herbs, and supplements. General instructions Follow instructions from your doctor about what you cannot eat or drink. Plan to have someone take you home from the hospital or clinic. If you will be going home right after the procedure, plan to have someone with you for 24 hours. Ask your doctor what steps will be taken to prevent infection. What happens during the procedure?  An IV tube will be put into one of your veins. You will be given a medicine to help you relax. The skin on your neck or groin will be numbed. A cut (incision) will be made in your neck or groin. A needle will be put through your cut and into a large vein. A tube (catheter) will be put into the needle. The tube will be moved to your heart. Dye may be put through the tube. This helps your doctor see your heart. Small devices (electrodes) on the tube will send out signals. A type of energy will be used to destroy some heart tissue. The tube will be taken out. Pressure will be held on your cut. This helps stop bleeding. A bandage will be put over your cut. The exact procedure may vary among doctors and hospitals. What happens after the  procedure? You will be watched until you leave the hospital or clinic. This includes checking your heart rate, breathing rate, oxygen, and blood pressure. Your cut will be watched for bleeding. You will need to lie still for a few hours. Do not drive for 24 hours or as long as your doctor tells you. Summary Cardiac ablation is a  procedure to destroy some heart tissue. This is done to treat heart rhythm problems. Tell your doctor about any medical conditions you may have. Tell him or her about all medicines you are taking to treat them. This is a safe procedure. But problems may occur. These include infection, bruising, bleeding, and damage to nearby areas of your body. Follow what your doctor tells you about food and drink. You may also be told to change or stop some of your medicines. After the procedure, do not drive for 24 hours or as long as your doctor tells you. This information is not intended to replace advice given to you by your health care provider. Make sure you discuss any questions you have with your health care provider. Document Revised: 10/31/2019 Document Reviewed: 10/31/2019 Elsevier Patient Education  2022 [employer].

## 2021-10-12 NOTE — Progress Notes (Signed)
Electrophysiology Office Note   Date:  10/12/2021   ID:  Brandon [name], DOB [birth year], MRN [id number]  PCP:  Maurice Small, MD (Inactive)  Cardiologist:  Aundra Dubin Primary Electrophysiologist:  Jatziri Goffredo Meredith Leeds, MD    Chief Complaint: AF   History of Present Illness: Brandon [name] is a 75 y.o. male who is being seen today for the evaluation of AF at the request of Larey Dresser, MD. Presenting today for electrophysiology evaluation.  He has a history of CVA, atrial fibrillation, valvular heart disease, diastolic heart failure.  He had been having episodes of dyspnea.  He had a TEE in 2018 that showed a normal EF with moderate AS and moderate MR.  He continued to have episodes of dyspnea and was admitted with shortness of breath and chest pressure.  He was noted to be volume overloaded and diastolic heart failure with runs of symptomatic atrial fibrillation with rapid rates.  He was started on amiodarone.  Unfortunately he developed respiratory distress and was intubated.  He required CIR for a couple of weeks due to a prolonged hospital stay.  TEE showed an ejection fraction of 50% with severe MR, moderate AAS and severe AI.  December 2018 he had surgery with a bioprosthetic aortic and mitral valve placed.  He had a CABG with vein graft to the PDA, maze, left atrial appendage clipping.  His postoperative course was complicated by complete heart block.  He is status post Medtronic dual-chamber pacemaker.  He was noted to be in atrial flutter and had a TEE guided cardioversion 12/16/2017.  TEE showed an ejection fraction of 25 to 30% with normally functioning valves.  Repeat echo in 2019 showed an ejection fraction of 50 to 55%.  In 2020 he went back into atrial fibrillation and was started on amiodarone.  February 2022 he had a chest CT that showed ILD, usual interstitial pneumonitis.  Amiodarone was stopped but he developed recurrent atrial fibrillation.  Today, he denies symptoms of  palpitations, chest pain, shortness of breath, orthopnea, PND, lower extremity edema, claudication, dizziness, presyncope, syncope, bleeding, or neurologic sequela. The patient is tolerating medications without difficulties.  His main complaint today is fatigue.  He is able do most of his daily activities without restriction, though he gets quite tired when he tries to do just about anything.  He fortunately is in sinus rhythm and has been in normal rhythm since his cardioversion last summer.  He would like to get off of his amiodarone as he feels that he has having side effects, and that due to his lung issues.   Past Medical History:  Diagnosis Date   Anemia 07/13/2017   Anxiety    Aortic insufficiency    Aortic stenosis, moderate 07/13/2017   Arthritis    back    Carotid stenosis    Right carotid stent (widely patent) 40 - 59% left plaque 11/13   CHF (congestive heart failure) (HCC)    Chronic kidney disease    COPD (chronic obstructive pulmonary disease) (HCC)    Coronary artery disease involving native coronary artery of native heart without angina pectoris    Depression    Dyslipidemia    GERD (gastroesophageal reflux disease)    Heart murmur    Hemiplegia affecting unspecified side, late effect of cerebrovascular disease    resolved- from L side    Hypertension    Jaundice    resolved following ERCP & Cholecystectomy   Mild emphysema (HCC)    Mitral  regurgitation    Mitral valve insufficiency and aortic valve insufficiency    Myocardial infarction [facility])    Paroxysmal atrial fibrillation (HCC)    Pre-diabetes    per spouse   S/P aortic valve replacement with bioprosthetic valve 11/16/2017   23 mm [facility] Ease stented bovine pericardial tissue valve   S/P CABG x 1 11/16/2017   SVG to PDA with [facility] via right thigh   S/P Maze operation for atrial fibrillation 11/16/2017   Left side lesion set using bipolar radiofrequency and cryothermy ablation with clipping of LA appendage    S/P mitral valve replacement with bioprosthetic valve 11/16/2017   27 mm [facility] Mitral stented bovine pericardial tissue valve   Sleep apnea    does not wear CPAP   Sleep concern    resulted in surgery- after + sleep test. Pt. doesn't have a problem any longer.    Stroke ([place]) 03/11/2003   stent placed on the 31, 3, 2004, L side    Wears glasses    Wears hearing aid in both ears    Wears partial dentures    Past Surgical History:  Procedure Laterality Date   AORTIC VALVE REPLACEMENT N/A 11/16/2017   Procedure: AORTIC VALVE REPLACEMENT (AVR) with 46mm Magna Ease;  Surgeon: Rexene Alberts, MD;  Location: [place];  Service: Open Heart Surgery;  Laterality: N/A;   ARTERIAL LINE INSERTION Right 11/16/2017   Procedure: ARTERIAL LINE INSERTION -RIGHT FEMORAL;  Surgeon: Rexene Alberts, MD;  Location: [place];  Service: Open Heart Surgery;  Laterality: Right;   BACK SURGERY     lumbar back   CARDIOVERSION N/A 04/11/2018   Procedure: CARDIOVERSION;  Surgeon: Larey Dresser, MD;  Location: [facility] ENDOSCOPY;  Service: Cardiovascular;  Laterality: N/A;   CARDIOVERSION N/A 06/18/2021   Procedure: CARDIOVERSION;  Surgeon: Buford Dresser, MD;  Location: [facility] ENDOSCOPY;  Service: Cardiovascular;  Laterality: N/A;   CHOLECYSTECTOMY     CORONARY ARTERY BYPASS GRAFT N/A 11/16/2017   Procedure: CORONARY ARTERY BYPASS GRAFTING (CABG) x 1;  Surgeon: Rexene Alberts, MD;  Location: [place];  Service: Open Heart Surgery;  Laterality: N/A;   ENDOVEIN HARVEST OF GREATER SAPHENOUS VEIN Right 11/16/2017   Procedure: ENDOVEIN HARVEST OF GREATER SAPHENOUS VEIN;  Surgeon: Rexene Alberts, MD;  Location: [place];  Service: Open Heart Surgery;  Laterality: Right;   ERCP N/A 05/31/2013   Procedure: ENDOSCOPIC RETROGRADE CHOLANGIOPANCREATOGRAPHY (ERCP);  Surgeon: Ladene Artist, MD;  Location: [facility];  Service: Endoscopy;  Laterality: N/A;   FOOT SURGERY     right   IR RADIOLOGY PERIPHERAL GUIDED IV START   09/28/2017   IR US GUIDE VASC ACCESS RIGHT  09/28/2017   LAPAROSCOPIC CHOLECYSTECTOMY SINGLE PORT N/A 06/01/2013   Procedure: LAPAROSCOPIC CHOLECYSTECTOMY SINGLE PORT;  Surgeon: Adin Hector, MD;  Location: [facility];  Service: General;  Laterality: N/A;   MAZE N/A 11/16/2017   Procedure: MAZE;  Surgeon: Rexene Alberts, MD;  Location: [place];  Service: Open Heart Surgery;  Laterality: N/A;   MITRAL VALVE REPAIR N/A 11/16/2017   Procedure: MITRAL VALVE  REPLACEMENT with 34mm MagnaEase;  Surgeon: Rexene Alberts, MD;  Location: [place];  Service: Open Heart Surgery;  Laterality: N/A;   PACEMAKER IMPLANT N/A 11/21/2017   Procedure: PACEMAKER IMPLANT;  Surgeon: Evans Lance, MD;  Location: [place] CV LAB;  Service: Cardiovascular;  Laterality: N/A;   POLYPECTOMY     RIGHT/LEFT HEART CATH AND CORONARY ANGIOGRAPHY N/A  08/30/2017   Procedure: RIGHT/LEFT HEART CATH AND CORONARY ANGIOGRAPHY;  Surgeon: Larey Dresser, MD;  Location: [place] CV LAB;  Service: Cardiovascular;  Laterality: N/A;   SHOULDER ARTHROSCOPY WITH ROTATOR CUFF REPAIR AND SUBACROMIAL DECOMPRESSION Left 05/18/2017   SHOULDER ARTHROSCOPY WITH ROTATOR CUFF REPAIR AND SUBACROMIAL DECOMPRESSION Left 05/18/2017   Procedure: SHOULDER ARTHROSCOPY WITH ROTATOR CUFF REPAIR AND SUBACROMIAL DECOMPRESSION;  Surgeon: Tania Ade, MD;  Location: [place];  Service: Orthopedics;  Laterality: Left;  LEFT SHOULDER ARTHROSCOPY WITH ROTATOR CUFF REPAIR AND SUBACROMIAL DECOMPRESSION   TEE WITHOUT CARDIOVERSION N/A 05/09/2017   Procedure: TRANSESOPHAGEAL ECHOCARDIOGRAM (TEE);  Surgeon: Skeet Latch, MD;  Location: [place];  Service: Cardiovascular;  Laterality: N/A;   TEE WITHOUT CARDIOVERSION N/A 08/30/2017   Procedure: TRANSESOPHAGEAL ECHOCARDIOGRAM (TEE);  Surgeon: Larey Dresser, MD;  Location: [facility] ENDOSCOPY;  Service: Cardiovascular;  Laterality: N/A;   TEE WITHOUT CARDIOVERSION N/A 11/16/2017   Procedure: TRANSESOPHAGEAL  ECHOCARDIOGRAM (TEE);  Surgeon: Rexene Alberts, MD;  Location: [place];  Service: Open Heart Surgery;  Laterality: N/A;   TEE WITHOUT CARDIOVERSION N/A 04/11/2018   Procedure: TRANSESOPHAGEAL ECHOCARDIOGRAM (TEE);  Surgeon: Larey Dresser, MD;  Location: [facility] ENDOSCOPY;  Service: Cardiovascular;  Laterality: N/A;   TEE WITHOUT CARDIOVERSION N/A 06/18/2021   Procedure: TRANSESOPHAGEAL ECHOCARDIOGRAM (TEE);  Surgeon: Buford Dresser, MD;  Location: [facility] ENDOSCOPY;  Service: Cardiovascular;  Laterality: N/A;   TONSILLECTOMY       Current Outpatient Medications  Medication Sig Dispense Refill   acetaminophen (TYLENOL) 325 MG tablet Take 1 tablet (325 mg total) by mouth every 6 (six) hours as needed for mild pain. 30 tablet 0   amiodarone (PACERONE) 200 MG tablet Take 1 tablet (200 mg total) by mouth daily. 45 tablet 3   Azelastine HCl 137 MCG/SPRAY SOLN Squirt 1 puff in each nostril twice a day. 30 mL 11   calcium carbonate (TUMS - DOSED IN MG ELEMENTAL CALCIUM) 500 MG chewable tablet Chew 1 tablet by mouth daily as needed for indigestion or heartburn.     carvedilol (COREG) 12.5 MG tablet Take 1 tablet (12.5 mg total) by mouth 2 (two) times daily with a meal. 180 tablet 3   cetirizine (ZYRTEC) 10 MG tablet Take 10 mg by mouth daily as needed for allergies.     citalopram (CELEXA) 20 MG tablet Take 1 tablet (20 mg total) by mouth at bedtime. 30 tablet 5   docusate sodium (COLACE) 100 MG capsule Take 1 capsule (100 mg total) by mouth 3 (three) times daily as needed. 20 capsule 0   empagliflozin (JARDIANCE) 10 MG TABS tablet TAKE 1 TABLET BY MOUTH ONCE A DAY BEFORE BREAKFAST 90 tablet 3   EPINEPHrine (EPIPEN JR) 0.15 MG/0.3ML injection Inject 0.15 mg into the muscle as needed for anaphylaxis.     fluticasone (FLONASE) 50 MCG/ACT nasal spray Place into both nostrils as needed for allergies or rhinitis.     furosemide (LASIX) 40 MG tablet Take 1 tablet (40 mg total) by mouth daily. 135 tablet 3    loratadine (CLARITIN) 10 MG tablet Take 10 mg by mouth daily.     methocarbamol (ROBAXIN) 500 MG tablet Take 1 tablet (500 mg total) by mouth every 8 (eight) hours as needed for muscle spasms. 30 tablet 0   pantoprazole (PROTONIX) 40 MG tablet Take 1 tablet (40 mg total) by mouth daily. 30 tablet 1   Pseudoeph-Doxylamine-DM-APAP (NYQUIL PO) Take 30 mLs by mouth at bedtime as needed (cold symptoms).  rosuvastatin (CRESTOR) 10 MG tablet TAKE 1 TABLET EVERY DAY 90 tablet 0   sacubitril-valsartan (ENTRESTO) 49-51 MG Take 1 tablet by mouth 2 (two) times daily. 60 tablet 3   spironolactone (ALDACTONE) 25 MG tablet TAKE 1 TABLET EVERY DAY 90 tablet 3   traZODone (DESYREL) 50 MG tablet Take 100 mg by mouth at bedtime.     warfarin (COUMADIN) 2.5 MG tablet Take 1/2 to 1 tablet by mouth once daily as directed by Coumadin Clinic 90 tablet 1   No current facility-administered medications for this visit.    Allergies:   Bee venom   Social History:  The patient  reports that he quit smoking about 4 years ago. His smoking use included cigarettes. He has a 28.50 pack-year smoking history. He has never used smokeless tobacco. He reports current alcohol use of about 4.0 - 6.0 standard drinks per week. He reports that he does not use drugs.   Family History:  The patient's family history includes Angina in his mother; Kidney cancer in his brother; Stroke in his father.    ROS:  Please see the history of present illness.   Otherwise, review of systems is positive for none.   All other systems are reviewed and negative.    PHYSICAL EXAM: VS:  BP 118/68   Pulse 61   Resp 18   Ht 5\' 11"  (1.803 m)   Wt 276 lb (125.2 kg)   SpO2 92%   BMI 38.49 kg/m  , BMI Body mass index is 38.49 kg/m. GEN: Well nourished, well developed, in no acute distress  HEENT: normal  Neck: no JVD, carotid bruits, or masses Cardiac: RRR; no murmurs, rubs, or gallops,no edema  Respiratory:  clear to auscultation bilaterally,  normal work of breathing GI: soft, nontender, nondistended, + BS MS: no deformity or atrophy  Skin: warm and dry, device pocket is well healed Neuro:  Strength and sensation are intact Psych: euthymic mood, full affect  EKG:  EKG is not ordered today. Personal review of the ekg ordered 10/07/21 shows atrial paced, rate 62 first-degree AV block  Device interrogation is reviewed today in detail.  See PaceArt for details.   Recent Labs: 06/11/2021: Hemoglobin 13.7; Platelets 226 08/03/2021: B Natriuretic Peptide 113.8 10/07/2021: ALT 11; BUN 24; Creatinine, Ser 1.90; Potassium 3.9; Sodium 136; TSH 2.777    Lipid Panel     Component Value Date/Time   CHOL 125 10/07/2021 1244   CHOL 120 12/27/2012 0912   TRIG 84 10/07/2021 1244   TRIG 115 12/27/2012 0912   HDL 64 10/07/2021 1244   HDL 40 12/27/2012 0912   CHOLHDL 2.0 10/07/2021 1244   VLDL 17 10/07/2021 1244   LDLCALC 44 10/07/2021 1244   LDLCALC 57 12/27/2012 0912     Wt Readings from Last 3 Encounters:  10/12/21 276 lb (125.2 kg)  10/07/21 276 lb (125.2 kg)  08/03/21 273 lb 12.8 oz (124.2 kg)      Other studies Reviewed: Additional studies/ records that were reviewed today include: TEE 06/18/21  Review of the above records today demonstrates:   1. Left ventricular ejection fraction, by estimation, is 50 to 55%. The  left ventricle has low normal function. The left ventricle demonstrates  regional wall motion abnormalities (see scoring diagram/findings for  description). There is hypokinesis of  the left ventricular, basal-mid septal wall.   2. Right ventricular systolic function is normal. The right ventricular  size is normal.   3. S/P LAA clip, no residual LAA  noted. Left atrial size was mildly  dilated. No left atrial/left atrial appendage thrombus was detected.   4. Right atrial size was mildly dilated.   5. The mitral valve has been repaired/replaced. Trivial mitral valve  regurgitation. There is a 27 mm Edwards  Magne Bovine pericardial  bioprosthetic valve present in the mitral position. Procedure Date: 2018.  Echo findings are consistent with normal  structure and function of the mitral valve prosthesis.   6. The aortic valve has been repaired/replaced. Aortic valve  regurgitation is not visualized. There is a 23 mm Magna valve present in  the aortic position. Procedure Date: 2018. Echo findings are consistent  with normal structure and function of the  aortic valve prosthesis.   7. There is Moderate (Grade III) plaque involving the descending aorta.    ASSESSMENT AND PLAN:  1.  Persistent atrial fibrillation/atypical flutter: Has had a surgical maze and left atrial appendage clipping in the past.  He unfortunately tolerates his arrhythmias poorly.  He has been on amiodarone but he has ILD.  Currently on amiodarone 200 mg daily, warfarin.  CHA2DS2-VASc of 6.  He would like to get off of his amiodarone due to multiple side effects.  Due to that, we Anyelin Mogle plan for ablation.  Risk, benefits, and alternatives to EP study and radiofrequency ablation for afib were also discussed in detail today. These risks include but are not limited to stroke, bleeding, vascular damage, tamponade, perforation, damage to the esophagus, lungs, and other structures, pulmonary vein stenosis, worsening renal function, and death. The patient understands these risk and wishes to proceed.  We Sam Overbeck therefore proceed with catheter ablation at the next available time.  Carto, ICE, anesthesia are requested for the procedure.  Rashaun Wichert also obtain CT PV protocol prior to the procedure to exclude LAA thrombus and further evaluate atrial anatomy.   2.  Valvular heart disease: TEE shows stable mitral and aortic valve replacements.  Plan per primary cardiology.  3.  Chronic systolic heart failure: Ejection fraction is improved.  Now his heart failure is likely all diastolic.  Currently on Entresto 49/51 mg twice daily, Coreg 12.5 mg twice  daily, Aldactone 25 mg daily, Jardiance 10 mg daily per primary cardiology.  We Areil Ottey continue with current management.  4.  Cryptogenic stroke: Was remote with mild residual issues.  Status post carotid stenting.  5.  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  Status post SVG to PDA.  Plan per primary cardiology.  6.  Complete heart block: That is post Medtronic dual-chamber pacemaker.  Device functioning appropriately.  No changes.  Case discussed with primary cardiology  Current medicines are reviewed at length with the patient today.   The patient does not have concerns regarding his medicines.  The following changes were made today:  none  Labs/ tests ordered today include:  Orders Placed This Encounter  Procedures   CT CARDIAC MORPH/PULM VEIN W/CM&W/O CA SCORE   Basic metabolic panel   CBC      Disposition:   FU with Orrie Schubert 3 months  Signed, Krystan Northrop Meredith Leeds, MD  10/12/2021 10:50 AM     [facility] [address] [place] [address] [phone] (office) [phone] (fax)

## 2021-10-13 ENCOUNTER — Ambulatory Visit (HOSPITAL_COMMUNITY)
Admission: RE | Admit: 2021-10-13 | Discharge: 2021-10-13 | Disposition: A | Discharge status: Home / Self Care | Payer: Medicare HMO | Source: Ambulatory Visit | Attending: Cardiology | Admitting: Cardiology

## 2021-10-13 DIAGNOSIS — J441 Chronic obstructive pulmonary disease with (acute) exacerbation: Secondary | ICD-10-CM | POA: Diagnosis not present

## 2021-10-13 LAB — PULMONARY FUNCTION TEST
DL/VA % pred: 69 %
DL/VA: 2.76 ml/min/mmHg/L
DLCO unc % pred: 50 %
DLCO unc: 13.16 ml/min/mmHg
FEF 25-75 Pre: 2.24 L/sec
FEF2575-%Pred-Pre: 96 %
FEV1-%Pred-Pre: 68 %
FEV1-Pre: 2.2 L
FEV1FVC-%Pred-Pre: 111 %
FEV6-%Pred-Pre: 65 %
FEV6-Pre: 2.72 L
FEV6FVC-%Pred-Pre: 106 %
FVC-%Pred-Pre: 61 %
FVC-Pre: 2.72 L
Pre FEV1/FVC ratio: 81 %
Pre FEV6/FVC Ratio: 100 %
RV % pred: 104 %
RV: 2.72 L
TLC % pred: 83 %
TLC: 6.03 L

## 2021-10-13 MED ORDER — ALBUTEROL SULFATE (2.5 MG/3ML) 0.083% IN NEBU: 2.5000 mg | INHALATION_SOLUTION | Freq: Once | RESPIRATORY_TRACT | Status: DC

## 2021-10-15 ENCOUNTER — Telehealth (HOSPITAL_COMMUNITY): Payer: Self-pay | Admitting: Pharmacy Technician

## 2021-10-15 NOTE — Telephone Encounter (Signed)
Advanced Heart Failure Patient Advocate Encounter  Patient's wife called and left a message about Entresto assistance renewal. Called and left a message.  Charlann Boxer, CPhT

## 2021-10-18 NOTE — Telephone Encounter (Signed)
Called and left the patient's wife another message.   Charlann Boxer, CPhT

## 2021-10-19 ENCOUNTER — Other Ambulatory Visit: Payer: Self-pay

## 2021-10-19 ENCOUNTER — Ambulatory Visit (INDEPENDENT_AMBULATORY_CARE_PROVIDER_SITE_OTHER): Payer: Medicare HMO

## 2021-10-19 DIAGNOSIS — Z9889 Other specified postprocedural states: Secondary | ICD-10-CM | POA: Diagnosis not present

## 2021-10-19 DIAGNOSIS — I480 Paroxysmal atrial fibrillation: Secondary | ICD-10-CM

## 2021-10-19 DIAGNOSIS — Z5181 Encounter for therapeutic drug level monitoring: Secondary | ICD-10-CM

## 2021-10-19 DIAGNOSIS — Z8679 Personal history of other diseases of the circulatory system: Secondary | ICD-10-CM | POA: Diagnosis not present

## 2021-10-19 DIAGNOSIS — Z951 Presence of aortocoronary bypass graft: Secondary | ICD-10-CM | POA: Diagnosis not present

## 2021-10-19 DIAGNOSIS — Z953 Presence of xenogenic heart valve: Secondary | ICD-10-CM

## 2021-10-19 LAB — POCT INR: INR: 3.1 — AB (ref 2.0–3.0)

## 2021-10-19 NOTE — Patient Instructions (Signed)
Description   Take 1/2 tablet today, then resume same dosage of Warfarin 1 tablet (2.5mg ) daily. Recheck INR in 4 weeks Cou[facility]in [facility] [phone].

## 2021-10-19 NOTE — Telephone Encounter (Signed)
Advanced Heart Failure Patient Advocate Encounter  Spoke with patient's wife, she will bring POI and [employer] application. Will fill in provider's portion and send in once received.   Charlann Boxer, CPhT

## 2021-10-25 ENCOUNTER — Other Ambulatory Visit: Payer: Medicare HMO

## 2021-10-27 ENCOUNTER — Other Ambulatory Visit: Payer: Self-pay | Admitting: Internal Medicine

## 2021-10-27 NOTE — Telephone Encounter (Signed)
Prescription refill request received for warfarin Lov: 10/12/21 ([facility])  Next INR check: 11/16/21 Warfarin tablet strength: 2.5mg   Appropriate dose and refills sent to requested pharmacy.

## 2021-11-02 ENCOUNTER — Telehealth (HOSPITAL_COMMUNITY): Payer: Self-pay | Admitting: Pharmacy Technician

## 2021-11-02 ENCOUNTER — Other Ambulatory Visit (HOSPITAL_COMMUNITY): Payer: Self-pay | Admitting: *Deleted

## 2021-11-02 MED ORDER — ENTRESTO 49-51 MG PO TABS: 1.0000 | ORAL_TABLET | Freq: Two times a day (BID) | ORAL | 3 refills | Status: DC

## 2021-11-02 NOTE — Telephone Encounter (Addendum)
Advanced Heart Failure Patient Advocate Encounter  Spoke with patient's wife regarding Entresto assistance. Received application and POI. Will send in once provider's portion is signed.   Charlann Boxer, CPhT

## 2021-11-10 NOTE — Telephone Encounter (Signed)
Advanced Heart Failure Patient Advocate Encounter  Sent in application via fax, 71/21.  Will follow up.

## 2021-11-16 ENCOUNTER — Other Ambulatory Visit: Payer: Self-pay

## 2021-11-16 ENCOUNTER — Ambulatory Visit (INDEPENDENT_AMBULATORY_CARE_PROVIDER_SITE_OTHER): Payer: Medicare HMO

## 2021-11-16 DIAGNOSIS — Z951 Presence of aortocoronary bypass graft: Secondary | ICD-10-CM

## 2021-11-16 DIAGNOSIS — Z5181 Encounter for therapeutic drug level monitoring: Secondary | ICD-10-CM

## 2021-11-16 DIAGNOSIS — Z953 Presence of xenogenic heart valve: Secondary | ICD-10-CM

## 2021-11-16 DIAGNOSIS — I480 Paroxysmal atrial fibrillation: Secondary | ICD-10-CM

## 2021-11-16 DIAGNOSIS — Z9889 Other specified postprocedural states: Secondary | ICD-10-CM | POA: Diagnosis not present

## 2021-11-16 DIAGNOSIS — Z8679 Personal history of other diseases of the circulatory system: Secondary | ICD-10-CM

## 2021-11-16 LAB — POCT INR: INR: 3.2 — AB (ref 2.0–3.0)

## 2021-11-16 NOTE — Patient Instructions (Signed)
Description   Take 1/2 tablet today, then resume same dosage of Warfarin 1 tablet (2.5mg ) daily. Recheck INR in 1 week pending ablation on 12/16/20, checking weekly. Pt going out of town 12/16-12/23.  C[facility]madin Clinic [phone].

## 2021-11-22 ENCOUNTER — Other Ambulatory Visit: Payer: Medicare HMO | Admitting: *Deleted

## 2021-11-22 ENCOUNTER — Ambulatory Visit (INDEPENDENT_AMBULATORY_CARE_PROVIDER_SITE_OTHER): Payer: Medicare HMO | Admitting: *Deleted

## 2021-11-22 ENCOUNTER — Other Ambulatory Visit: Payer: Self-pay

## 2021-11-22 DIAGNOSIS — I480 Paroxysmal atrial fibrillation: Secondary | ICD-10-CM

## 2021-11-22 DIAGNOSIS — Z5181 Encounter for therapeutic drug level monitoring: Secondary | ICD-10-CM | POA: Diagnosis not present

## 2021-11-22 DIAGNOSIS — Z953 Presence of xenogenic heart valve: Secondary | ICD-10-CM

## 2021-11-22 DIAGNOSIS — Z01812 Encounter for preprocedural laboratory examination: Secondary | ICD-10-CM | POA: Diagnosis not present

## 2021-11-22 DIAGNOSIS — Z951 Presence of aortocoronary bypass graft: Secondary | ICD-10-CM

## 2021-11-22 DIAGNOSIS — Z8679 Personal history of other diseases of the circulatory system: Secondary | ICD-10-CM

## 2021-11-22 DIAGNOSIS — Z9889 Other specified postprocedural states: Secondary | ICD-10-CM | POA: Diagnosis not present

## 2021-11-22 DIAGNOSIS — I4819 Other persistent atrial fibrillation: Secondary | ICD-10-CM | POA: Diagnosis not present

## 2021-11-22 LAB — POCT INR: INR: 2.4 (ref 2.0–3.0)

## 2021-11-22 NOTE — Patient Instructions (Signed)
Description   Continue taking Warfarin 1 tablet (2.5mg ) daily. Recheck INR in 1 week pending ablation on 12/16/20, checking weekly. Pt going out of town 12/16-12/23.  Coumadin Clinic [phone].

## 2021-11-23 LAB — BASIC METABOLIC PANEL
BUN/Creatinine Ratio: 15 (ref 10–24)
BUN: 31 mg/dL — ABNORMAL HIGH (ref 8–27)
CO2: 21 mmol/L (ref 20–29)
Calcium: 8.5 mg/dL — ABNORMAL LOW (ref 8.6–10.2)
Chloride: 107 mmol/L — ABNORMAL HIGH (ref 96–106)
Creatinine, Ser: 2.12 mg/dL — ABNORMAL HIGH (ref 0.76–1.27)
Glucose: 125 mg/dL — ABNORMAL HIGH (ref 70–99)
Potassium: 4.1 mmol/L (ref 3.5–5.2)
Sodium: 145 mmol/L — ABNORMAL HIGH (ref 134–144)
eGFR: 32 mL/min/{1.73_m2} — ABNORMAL LOW (ref >59)

## 2021-11-23 LAB — CBC
Hematocrit: 38.8 % (ref 37.5–51.0)
Hemoglobin: 13.1 g/dL (ref 13.0–17.7)
MCH: 34 pg — ABNORMAL HIGH (ref 26.6–33.0)
MCHC: 33.8 g/dL (ref 31.5–35.7)
MCV: 101 fL — ABNORMAL HIGH (ref 79–97)
Platelets: 195 10*3/uL (ref 150–450)
RBC: 3.85 x10E6/uL — ABNORMAL LOW (ref 4.14–5.80)
RDW: 12.8 % (ref 11.6–15.4)
WBC: 7.4 10*3/uL (ref 3.4–10.8)

## 2021-12-02 ENCOUNTER — Ambulatory Visit (INDEPENDENT_AMBULATORY_CARE_PROVIDER_SITE_OTHER): Payer: Medicare HMO

## 2021-12-02 DIAGNOSIS — I442 Atrioventricular block, complete: Secondary | ICD-10-CM

## 2021-12-02 LAB — CUP PACEART REMOTE DEVICE CHECK
Battery Remaining Longevity: 112 mo
Battery Voltage: 3.01 V
Brady Statistic AP VP Percent: 0.14 %
Brady Statistic AP VS Percent: 82.5 %
Brady Statistic AS VP Percent: 0.29 %
Brady Statistic AS VS Percent: 17.07 %
Brady Statistic RA Percent Paced: 82.71 %
Brady Statistic RV Percent Paced: 0.43 %
Date Time Interrogation Session: 20221222003209
Implantable Lead Implant Date: 20181211
Implantable Lead Implant Date: 20181211
Implantable Lead Location: 753859
Implantable Lead Location: 753860
Implantable Lead Model: 3830
Implantable Lead Model: 5076
Implantable Pulse Generator Implant Date: 20181211
Lead Channel Impedance Value: 266 Ohm
Lead Channel Impedance Value: 304 Ohm
Lead Channel Impedance Value: 361 Ohm
Lead Channel Impedance Value: 380 Ohm
Lead Channel Pacing Threshold Amplitude: 0.625 V
Lead Channel Pacing Threshold Amplitude: 1.5 V
Lead Channel Pacing Threshold Pulse Width: 0.4 ms
Lead Channel Pacing Threshold Pulse Width: 0.4 ms
Lead Channel Sensing Intrinsic Amplitude: 4.5 mV
Lead Channel Sensing Intrinsic Amplitude: 4.5 mV
Lead Channel Sensing Intrinsic Amplitude: 5.5 mV
Lead Channel Sensing Intrinsic Amplitude: 5.5 mV
Lead Channel Setting Pacing Amplitude: 2 V
Lead Channel Setting Pacing Amplitude: 2.5 V
Lead Channel Setting Pacing Pulse Width: 0.4 ms
Lead Channel Setting Sensing Sensitivity: 1.2 mV

## 2021-12-03 ENCOUNTER — Ambulatory Visit (INDEPENDENT_AMBULATORY_CARE_PROVIDER_SITE_OTHER): Payer: Medicare HMO

## 2021-12-03 ENCOUNTER — Other Ambulatory Visit: Payer: Self-pay

## 2021-12-03 DIAGNOSIS — Z951 Presence of aortocoronary bypass graft: Secondary | ICD-10-CM

## 2021-12-03 DIAGNOSIS — I480 Paroxysmal atrial fibrillation: Secondary | ICD-10-CM

## 2021-12-03 DIAGNOSIS — Z953 Presence of xenogenic heart valve: Secondary | ICD-10-CM

## 2021-12-03 DIAGNOSIS — Z5181 Encounter for therapeutic drug level monitoring: Secondary | ICD-10-CM | POA: Diagnosis not present

## 2021-12-03 DIAGNOSIS — Z8679 Personal history of other diseases of the circulatory system: Secondary | ICD-10-CM

## 2021-12-03 LAB — POCT INR: INR: 3.2 — AB (ref 2.0–3.0)

## 2021-12-03 NOTE — Patient Instructions (Signed)
Description   Continue taking Warfarin 1 tablet (2.5mg ) daily. Recheck INR in 1 week pending ablation on 12/16/20, checking weekly. Pt going out of town 12/16-12/23.  Coumadin Clinic [phone].

## 2021-12-07 ENCOUNTER — Telehealth (HOSPITAL_COMMUNITY): Payer: Self-pay | Admitting: Emergency Medicine

## 2021-12-07 NOTE — Telephone Encounter (Signed)
Calling patient/wife to cancel PV CTA on 12/29. Patients kidney function is poor (creat >2.0) and therefore not a good candidate to receive contrast. (No nephrologist at this point).  Per [facility]- ok to forego the CT scan.  Pts wife appreciated the call and had no further questions.  Marchia Bond RN Navigator Cardiac Imaging [facility] [phone] Office  [phone] Cell

## 2021-12-09 ENCOUNTER — Ambulatory Visit (HOSPITAL_COMMUNITY): Admission: RE | Admit: 2021-12-09 | Payer: Medicare HMO | Source: Ambulatory Visit

## 2021-12-09 ENCOUNTER — Encounter (HOSPITAL_COMMUNITY): Payer: Self-pay

## 2021-12-10 ENCOUNTER — Ambulatory Visit (INDEPENDENT_AMBULATORY_CARE_PROVIDER_SITE_OTHER): Payer: Medicare HMO

## 2021-12-10 ENCOUNTER — Other Ambulatory Visit: Payer: Self-pay

## 2021-12-10 DIAGNOSIS — Z9889 Other specified postprocedural states: Secondary | ICD-10-CM

## 2021-12-10 DIAGNOSIS — Z5181 Encounter for therapeutic drug level monitoring: Secondary | ICD-10-CM | POA: Diagnosis not present

## 2021-12-10 DIAGNOSIS — Z953 Presence of xenogenic heart valve: Secondary | ICD-10-CM | POA: Diagnosis not present

## 2021-12-10 DIAGNOSIS — I480 Paroxysmal atrial fibrillation: Secondary | ICD-10-CM | POA: Diagnosis not present

## 2021-12-10 DIAGNOSIS — Z951 Presence of aortocoronary bypass graft: Secondary | ICD-10-CM | POA: Diagnosis not present

## 2021-12-10 DIAGNOSIS — Z8679 Personal history of other diseases of the circulatory system: Secondary | ICD-10-CM | POA: Diagnosis not present

## 2021-12-10 LAB — POCT INR: INR: 2.4 (ref 2.0–3.0)

## 2021-12-10 NOTE — Patient Instructions (Signed)
-   Continue taking Warfarin 1 tablet (2.5mg ) daily. - Recheck INR in 1 week after ablation on 12/16/20 Cou[facility]in [facility] [phone].

## 2021-12-10 NOTE — Progress Notes (Signed)
Remote pacemaker transmission.   

## 2021-12-14 ENCOUNTER — Encounter: Payer: Self-pay | Admitting: Cardiology

## 2021-12-14 ENCOUNTER — Other Ambulatory Visit (HOSPITAL_COMMUNITY): Payer: Self-pay | Admitting: Cardiology

## 2021-12-14 ENCOUNTER — Other Ambulatory Visit (HOSPITAL_COMMUNITY): Payer: Self-pay

## 2021-12-14 NOTE — Telephone Encounter (Signed)
Spoke with pts wife she is aware and agreeable with plan.  

## 2021-12-14 NOTE — Telephone Encounter (Signed)
Spoke with the patient's wife who reports that over the past week or so the patient's swelling has worsened in his legs and feet. She states that over the weekend he was having some shortness of breath so she checked his oxygen which was at 81%. She wanted the patient to go to the ER but he refused to go. She had the patient take some deep breaths and his O2 came up to the low 90s. It has remained around 91-94% since then. The patient denies any weight gain. The wife states that they were out of town for the holidays and ate out a lot so he had trouble watching his sodium intake. Advised on making sure he limits sodium and encouraged him to elevate his legs frequently. Will send message to CHF clinic.

## 2021-12-15 ENCOUNTER — Other Ambulatory Visit (HOSPITAL_COMMUNITY): Payer: Self-pay

## 2021-12-15 ENCOUNTER — Ambulatory Visit: Payer: Medicare HMO | Admitting: Pulmonary Disease

## 2021-12-15 NOTE — Anesthesia Preprocedure Evaluation (Addendum)
Anesthesia Evaluation  Patient identified by MRN, date of birth, ID band Patient awake    Reviewed: Allergy & Precautions, H&P , NPO status , Patient's Chart, lab work & pertinent test results, reviewed documented beta blocker date and time   Airway Mallampati: III  TM Distance: >3 FB Neck ROM: Full    Dental no notable dental hx. (+) Teeth Intact, Dental Advisory Given   Pulmonary sleep apnea , COPD, former smoker,    Pulmonary exam normal breath sounds clear to auscultation       Cardiovascular Exercise Tolerance: Good hypertension, Pt. on medications and Pt. on home beta blockers + CAD and +CHF  + dysrhythmias Atrial Fibrillation + pacemaker  Rhythm:Regular Rate:Normal     Neuro/Psych Anxiety Depression CVA, Residual Symptoms    GI/Hepatic Neg liver ROS, GERD  Medicated,  Endo/Other  Morbid obesity  Renal/GU Renal InsufficiencyRenal disease  negative genitourinary   Musculoskeletal  (+) Arthritis , Osteoarthritis,    Abdominal   Peds  Hematology  (+) Blood dyscrasia, anemia ,   Anesthesia Other Findings   Reproductive/Obstetrics negative OB ROS                            Anesthesia Physical Anesthesia Plan  ASA: 3  Anesthesia Plan: General   Post-op Pain Management: Tylenol PO (pre-op) and Minimal or no pain anticipated   Induction: Intravenous  PONV Risk Score and Plan: 3 and Dexamethasone and Treatment may vary due to age or medical condition  Airway Management Planned: Oral ETT  Additional Equipment:   Intra-op Plan:   Post-operative Plan: Extubation in OR  Informed Consent: I have reviewed the patients History and Physical, chart, labs and discussed the procedure including the risks, benefits and alternatives for the proposed anesthesia with the patient or authorized representative who has indicated his/her understanding and acceptance.     Dental advisory  given  Plan Discussed with: CRNA  Anesthesia Plan Comments:        Anesthesia Quick Evaluation

## 2021-12-15 NOTE — Pre-Procedure Instructions (Signed)
Attempted to call patient regarding procedure instructions.  Left voice mail on the following items: Arrival time 0530 Nothing to eat or drink after midnight No meds AM of procedure Responsible person to drive you home and stay with you for 24 hrs  Have you missed any doses of anti-coagulant Coumadin- take today's dose none in the morning

## 2021-12-16 ENCOUNTER — Other Ambulatory Visit (HOSPITAL_COMMUNITY): Payer: Self-pay

## 2021-12-16 ENCOUNTER — Ambulatory Visit (HOSPITAL_COMMUNITY)
Admission: RE | Disposition: A | Discharge status: Home / Self Care | Payer: Medicare HMO | Source: Home / Self Care | Attending: Cardiology

## 2021-12-16 ENCOUNTER — Encounter (HOSPITAL_COMMUNITY): Payer: Self-pay | Admitting: Cardiology

## 2021-12-16 ENCOUNTER — Other Ambulatory Visit: Payer: Self-pay

## 2021-12-16 ENCOUNTER — Ambulatory Visit (HOSPITAL_COMMUNITY): Payer: Medicare HMO | Admitting: Anesthesiology

## 2021-12-16 ENCOUNTER — Ambulatory Visit (HOSPITAL_COMMUNITY)
Admission: RE | Admit: 2021-12-16 | Discharge: 2021-12-16 | Disposition: A | Discharge status: Home / Self Care | Payer: Medicare HMO | Attending: Cardiology | Admitting: Cardiology

## 2021-12-16 DIAGNOSIS — I4891 Unspecified atrial fibrillation: Secondary | ICD-10-CM | POA: Diagnosis not present

## 2021-12-16 DIAGNOSIS — I480 Paroxysmal atrial fibrillation: Secondary | ICD-10-CM | POA: Diagnosis present

## 2021-12-16 DIAGNOSIS — I110 Hypertensive heart disease with heart failure: Secondary | ICD-10-CM | POA: Diagnosis not present

## 2021-12-16 DIAGNOSIS — I484 Atypical atrial flutter: Secondary | ICD-10-CM | POA: Insufficient documentation

## 2021-12-16 DIAGNOSIS — I509 Heart failure, unspecified: Secondary | ICD-10-CM | POA: Diagnosis not present

## 2021-12-16 DIAGNOSIS — I2510 Atherosclerotic heart disease of native coronary artery without angina pectoris: Secondary | ICD-10-CM | POA: Diagnosis not present

## 2021-12-16 DIAGNOSIS — I4819 Other persistent atrial fibrillation: Secondary | ICD-10-CM | POA: Diagnosis not present

## 2021-12-16 HISTORY — PX: ATRIAL FIBRILLATION ABLATION: EP1191

## 2021-12-16 LAB — POCT ACTIVATED CLOTTING TIME: Activated Clotting Time: 293 seconds

## 2021-12-16 LAB — PROTIME-INR
INR: 2.6 — ABNORMAL HIGH (ref 0.8–1.2)
Prothrombin Time: 27.8 seconds — ABNORMAL HIGH (ref 11.4–15.2)

## 2021-12-16 SURGERY — ATRIAL FIBRILLATION ABLATION
Anesthesia: General

## 2021-12-16 MED ORDER — EPHEDRINE SULFATE 50 MG/ML IJ SOLN
INTRAMUSCULAR | Status: DC | PRN
  Administered 2021-12-16: 5 mg via INTRAVENOUS

## 2021-12-16 MED ORDER — CEFAZOLIN SODIUM-DEXTROSE 2-3 GM-%(50ML) IV SOLR
INTRAVENOUS | Status: DC | PRN
  Administered 2021-12-16: 2 g via INTRAVENOUS

## 2021-12-16 MED ORDER — PHENYLEPHRINE 40 MCG/ML (10ML) SYRINGE FOR IV PUSH (FOR BLOOD PRESSURE SUPPORT)
PREFILLED_SYRINGE | INTRAVENOUS | Status: DC | PRN
  Administered 2021-12-16: 80 ug via INTRAVENOUS

## 2021-12-16 MED ORDER — SUGAMMADEX SODIUM 200 MG/2ML IV SOLN
INTRAVENOUS | Status: DC | PRN
  Administered 2021-12-16: 400 mg via INTRAVENOUS

## 2021-12-16 MED ORDER — HEPARIN SODIUM (PORCINE) 1000 UNIT/ML IJ SOLN
INTRAMUSCULAR | Status: DC | PRN
  Administered 2021-12-16: 3000 [IU] via INTRAVENOUS
  Administered 2021-12-16: 15000 [IU] via INTRAVENOUS

## 2021-12-16 MED ORDER — HEPARIN (PORCINE) IN NACL 1000-0.9 UT/500ML-% IV SOLN
INTRAVENOUS | Status: DC | PRN
  Administered 2021-12-16 (×5): 500 mL

## 2021-12-16 MED ORDER — CEFAZOLIN SODIUM-DEXTROSE 2-4 GM/100ML-% IV SOLN
INTRAVENOUS | Status: AC
  Filled 2021-12-16: qty 100

## 2021-12-16 MED ORDER — ACETAMINOPHEN 325 MG PO TABS
650.0000 mg | ORAL_TABLET | ORAL | Status: DC | PRN
  Filled 2021-12-16: qty 2

## 2021-12-16 MED ORDER — HEPARIN SODIUM (PORCINE) 1000 UNIT/ML IJ SOLN
INTRAMUSCULAR | Status: DC | PRN
  Administered 2021-12-16: 1000 [IU] via INTRAVENOUS

## 2021-12-16 MED ORDER — DOBUTAMINE INFUSION FOR EP/ECHO/NUC (1000 MCG/ML)
INTRAVENOUS | Status: AC
  Filled 2021-12-16: qty 250

## 2021-12-16 MED ORDER — HEPARIN (PORCINE) IN NACL 1000-0.9 UT/500ML-% IV SOLN
INTRAVENOUS | Status: AC
  Filled 2021-12-16: qty 2500

## 2021-12-16 MED ORDER — PROTAMINE SULFATE 10 MG/ML IV SOLN
INTRAVENOUS | Status: DC | PRN
  Administered 2021-12-16: 20 mg via INTRAVENOUS
  Administered 2021-12-16 (×2): 10 mg via INTRAVENOUS

## 2021-12-16 MED ORDER — PHENYLEPHRINE HCL-NACL 20-0.9 MG/250ML-% IV SOLN
INTRAVENOUS | Status: DC | PRN
  Administered 2021-12-16: 60 ug/min via INTRAVENOUS

## 2021-12-16 MED ORDER — SODIUM CHLORIDE 0.9% FLUSH: 3.0000 mL | INTRAVENOUS | Status: DC | PRN

## 2021-12-16 MED ORDER — SODIUM CHLORIDE 0.9% FLUSH: 3.0000 mL | Freq: Two times a day (BID) | INTRAVENOUS | Status: DC

## 2021-12-16 MED ORDER — FENTANYL CITRATE (PF) 100 MCG/2ML IJ SOLN
INTRAMUSCULAR | Status: DC | PRN
Start: 2021-12-16 — End: 2021-12-16
  Administered 2021-12-16: 100 ug via INTRAVENOUS

## 2021-12-16 MED ORDER — ONDANSETRON HCL 4 MG/2ML IJ SOLN: 4.0000 mg | Freq: Four times a day (QID) | INTRAMUSCULAR | Status: DC | PRN

## 2021-12-16 MED ORDER — LIDOCAINE 2% (20 MG/ML) 5 ML SYRINGE
INTRAMUSCULAR | Status: DC | PRN
  Administered 2021-12-16: 60 mg via INTRAVENOUS

## 2021-12-16 MED ORDER — SODIUM CHLORIDE 0.9 % IV SOLN: INTRAVENOUS | Status: DC

## 2021-12-16 MED ORDER — ACETAMINOPHEN 500 MG PO TABS
1000.0000 mg | ORAL_TABLET | Freq: Once | ORAL | Status: AC
  Administered 2021-12-16: 1000 mg via ORAL
  Filled 2021-12-16: qty 2

## 2021-12-16 MED ORDER — ONDANSETRON HCL 4 MG/2ML IJ SOLN
INTRAMUSCULAR | Status: DC | PRN
  Administered 2021-12-16: 4 mg via INTRAVENOUS

## 2021-12-16 MED ORDER — SODIUM CHLORIDE 0.9 % IV SOLN: 250.0000 mL | INTRAVENOUS | Status: DC | PRN

## 2021-12-16 MED ORDER — PROPOFOL 10 MG/ML IV BOLUS
INTRAVENOUS | Status: DC | PRN
  Administered 2021-12-16: 100 mg via INTRAVENOUS

## 2021-12-16 MED ORDER — ROCURONIUM BROMIDE 10 MG/ML (PF) SYRINGE
PREFILLED_SYRINGE | INTRAVENOUS | Status: DC | PRN
  Administered 2021-12-16: 60 mg via INTRAVENOUS

## 2021-12-16 MED ORDER — DOBUTAMINE INFUSION FOR EP/ECHO/NUC (1000 MCG/ML)
INTRAVENOUS | Status: DC | PRN
  Administered 2021-12-16: 20 ug/kg/min via INTRAVENOUS

## 2021-12-16 MED ORDER — FENTANYL CITRATE (PF) 100 MCG/2ML IJ SOLN
INTRAMUSCULAR | Status: AC
  Filled 2021-12-16: qty 2

## 2021-12-16 MED ORDER — ALBUTEROL SULFATE HFA 108 (90 BASE) MCG/ACT IN AERS
INHALATION_SPRAY | RESPIRATORY_TRACT | Status: DC | PRN
  Administered 2021-12-16: 1 via RESPIRATORY_TRACT

## 2021-12-16 MED ORDER — DEXAMETHASONE SODIUM PHOSPHATE 10 MG/ML IJ SOLN
INTRAMUSCULAR | Status: DC | PRN
  Administered 2021-12-16: 10 mg via INTRAVENOUS

## 2021-12-16 MED ORDER — HEPARIN SODIUM (PORCINE) 1000 UNIT/ML IJ SOLN
INTRAMUSCULAR | Status: AC
  Filled 2021-12-16: qty 10

## 2021-12-16 SURGICAL SUPPLY — 19 items
BAG SNAP BAND KOVER 36X36 (MISCELLANEOUS) ×1 IMPLANT
CATH OCTARAY 2.0 F 3-3-3-3-3 (CATHETERS) ×1 IMPLANT
CATH S CIRCA THERM PROBE 10F (CATHETERS) ×1 IMPLANT
CATH SMTCH THERMOCOOL SF DF (CATHETERS) ×1 IMPLANT
CATH SOUNDSTAR ECO 8FR (CATHETERS) ×1 IMPLANT
CATH WEBSTER BI DIR CS D-F CRV (CATHETERS) ×1 IMPLANT
CLOSURE PERCLOSE PROSTYLE (VASCULAR PRODUCTS) ×4 IMPLANT
COVER SWIFTLINK CONNECTOR (BAG) ×2 IMPLANT
KIT VERSACROSS STEERABLE D1 (CATHETERS) ×1 IMPLANT
PACK EP LATEX FREE (CUSTOM PROCEDURE TRAY) ×2
PACK EP LF (CUSTOM PROCEDURE TRAY) ×1 IMPLANT
PAD DEFIB RADIO PHYSIO CONN (PAD) ×2 IMPLANT
PATCH CARTO3 (PAD) ×1 IMPLANT
SHEATH CARTO VIZIGO SM CVD (SHEATH) ×1 IMPLANT
SHEATH PINNACLE 7F 10CM (SHEATH) ×1 IMPLANT
SHEATH PINNACLE 8F 10CM (SHEATH) ×2 IMPLANT
SHEATH PINNACLE 9F 10CM (SHEATH) ×1 IMPLANT
SHEATH PROBE COVER 6X72 (BAG) ×1 IMPLANT
TUBING SMART ABLATE COOLFLOW (TUBING) ×1 IMPLANT

## 2021-12-16 NOTE — Transfer of Care (Signed)
Immediate Anesthesia Transfer of Care Note  Patient: Brandon [name]  Procedure(s) Performed: ATRIAL FIBRILLATION ABLATION  Patient Location: Cath Lab  Anesthesia Type:General  Level of Consciousness: awake, alert , oriented and patient cooperative  Airway & Oxygen Therapy: Patient Spontanous Breathing and non-rebreather face mask  Post-op Assessment: Report given to RN, Post -op Vital signs reviewed and stable and Patient moving all extremities  Post vital signs: Reviewed and stable  Last Vitals:  Vitals Value Taken Time  BP 155/70 12/16/21 0955  Temp    Pulse 59 12/16/21 0957  Resp 15 12/16/21 0957  SpO2 95 % 12/16/21 0957  Vitals shown include unvalidated device data.  Last Pain:  Vitals:   12/16/21 0931  TempSrc:   PainSc: 0-No pain         Complications: There were no known notable events for this encounter.

## 2021-12-16 NOTE — Discharge Instructions (Signed)
Cardiac Ablation, Care After  This sheet gives you information about how to care for yourself after your procedure. Your health care provider may also give you more specific instructions. If you have problems or questions, contact your health care provider. What can I expect after the procedure? After the procedure, it is common to have:  Bruising around your puncture site.  Tenderness around your puncture site.  Skipped heartbeats.  Tiredness (fatigue).  Follow these instructions at home: Puncture site care   Follow instructions from your health care provider about how to take care of your puncture site. Make sure you: ? If present, leave stitches (sutures), skin glue, or adhesive strips in place. These skin closures may need to stay in place for up to 2 weeks. If adhesive strip edges start to loosen and curl up, you may trim the loose edges. Do not remove adhesive strips completely unless your health care provider tells you to do that. ? If a square bandage is present, this may be removed in 24 hours.   Check your puncture site every day for signs of infection. Check for: ? Redness, swelling, or pain. ? Fluid or blood. If your puncture site starts to bleed, lie down on your back, apply firm pressure to the area, and contact your health care provider. ? Warmth. ? Pus or a bad smell. Driving  Do not drive for at least 4 days after your procedure or however long your health care provider recommends. (Do not resume driving if you have previously been instructed not to drive for other health reasons.)  Do not drive or use heavy machinery while taking prescription pain medicine. Activity  Avoid activities that take a lot of effort for at least 7 days after your procedure.  Do not lift anything that is heavier than 5 lb (4.5 kg) for one week.   No sexual activity for 1 week.   Return to your normal activities as told by your health care provider. Ask your health care provider what  activities are safe for you. General instructions  Take over-the-counter and prescription medicines only as told by your health care provider.  Do not use any products that contain nicotine or tobacco, such as cigarettes and e-cigarettes. If you need help quitting, ask your health care provider.  You may shower after 24 hours, but Do not take baths, swim, or use a hot tub for 1 week.   Do not drink alcohol for 24 hours after your procedure.  Keep all follow-up visits as told by your health care provider. This is important. Contact a health care provider if:  You have redness, mild swelling, or pain around your puncture site.  You have fluid or blood coming from your puncture site that stops after applying firm pressure to the area.  Your puncture site feels warm to the touch.  You have pus or a bad smell coming from your puncture site.  You have a fever.  You have chest pain or discomfort that spreads to your neck, jaw, or arm.  You are sweating a lot.  You feel nauseous.  You have a fast or irregular heartbeat.  You have shortness of breath.  You are dizzy or light-headed and feel the need to lie down.  You have pain or numbness in the arm or leg closest to your puncture site. Get help right away if:  Your puncture site suddenly swells.  Your puncture site is bleeding and the bleeding does not stop after applying firm   pressure to the area. These symptoms may represent a serious problem that is an emergency. Do not wait to see if the symptoms will go away. Get medical help right away. Call your local emergency services (911 in the [place] Do not drive yourself to the hospital. Summary  After the procedure, it is normal to have bruising and tenderness at the puncture site in your groin, neck, or forearm.  Check your puncture site every day for signs of infection.  Get help right away if your puncture site is bleeding and the bleeding does not stop after applying firm  pressure to the area. This is a medical emergency. This information is not intended to replace advice given to you by your health care provider. Make sure you discuss any questions you have with your health care provider.    

## 2021-12-16 NOTE — H&P (Signed)
Electrophysiology Office Note   Date:  12/16/2021   ID:  Brandon [name], DOB 1946-08-14, MRN [id number]  PCP:  Maurice Small, MD  Cardiologist:  Aundra Dubin Primary Electrophysiologist:  Ravenna Legore Meredith Leeds, MD    Chief Complaint: AF   History of Present Illness: Brandon [name] is a 76 y.o. male who is being seen today for the evaluation of AF at the request of No ref. provider found. Presenting today for electrophysiology evaluation.  He has a history of CVA, atrial fibrillation, valvular heart disease, diastolic heart failure.  He had been having episodes of dyspnea.  He had a TEE in 2018 that showed a normal EF with moderate AS and moderate MR.  He continued to have episodes of dyspnea and was admitted with shortness of breath and chest pressure.  He was noted to be volume overloaded and diastolic heart failure with runs of symptomatic atrial fibrillation with rapid rates.  He was started on amiodarone.  Unfortunately he developed respiratory distress and was intubated.  He required CIR for a couple of weeks due to a prolonged hospital stay.  TEE showed an ejection fraction of 50% with severe MR, moderate AAS and severe AI.  December 2018 he had surgery with a bioprosthetic aortic and mitral valve placed.  He had a CABG with vein graft to the PDA, maze, left atrial appendage clipping.  His postoperative course was complicated by complete heart block.  He is status post Medtronic dual-chamber pacemaker.  He was noted to be in atrial flutter and had a TEE guided cardioversion 12/16/2017.  TEE showed an ejection fraction of 25 to 30% with normally functioning valves.  Repeat echo in 2019 showed an ejection fraction of 50 to 55%.  In 2020 he went back into atrial fibrillation and was started on amiodarone.  February 2022 he had a chest CT that showed ILD, usual interstitial pneumonitis.  Amiodarone was stopped but he developed recurrent atrial fibrillation.  Today, denies symptoms of palpitations,  chest pain, shortness of breath, orthopnea, PND, lower extremity edema, claudication, dizziness, presyncope, syncope, bleeding, or neurologic sequela. The patient is tolerating medications without difficulties. AF ablation today.    Past Medical History:  Diagnosis Date   Anemia 07/13/2017   Anxiety    Aortic insufficiency    Aortic stenosis, moderate 07/13/2017   Arthritis    back    Carotid stenosis    Right carotid stent (widely patent) 40 - 59% left plaque 11/13   CHF (congestive heart failure) (HCC)    Chronic kidney disease    COPD (chronic obstructive pulmonary disease) (HCC)    Coronary artery disease involving native coronary artery of native heart without angina pectoris    Depression    Dyslipidemia    GERD (gastroesophageal reflux disease)    Heart murmur    Hemiplegia affecting unspecified side, late effect of cerebrovascular disease    resolved- from L side    Hypertension    Jaundice    resolved following ERCP & Cholecystectomy   Mild emphysema (HCC)    Mitral regurgitation    Mitral valve insufficiency and aortic valve insufficiency    Myocardial infarction ([place])    Paroxysmal atrial fibrillation (HCC)    Pre-diabetes    per spouse   S/P aortic valve replacement with bioprosthetic valve 11/16/2017   23 mm [facility] Ease stented bovine pericardial tissue valve   S/P CABG x 1 11/16/2017   SVG to PDA with [facility] via right thigh  S/P Maze operation for atrial fibrillation 11/16/2017   Left side lesion set using bipolar radiofrequency and cryothermy ablation with clipping of LA appendage   S/P mitral valve replacement with bioprosthetic valve 11/16/2017   27 mm [facility] Mitral stented bovine pericardial tissue valve   Sleep apnea    does not wear CPAP   Sleep concern    resulted in surgery- after + sleep test. Pt. doesn't have a problem any longer.    Stroke ([place]) 03/11/2003   stent placed on the 31, 3, 2004, L side    Wears glasses    Wears hearing aid in  both ears    Wears partial dentures    Past Surgical History:  Procedure Laterality Date   AORTIC VALVE REPLACEMENT N/A 11/16/2017   Procedure: AORTIC VALVE REPLACEMENT (AVR) with 75mm Magna Ease;  Surgeon: Rexene Alberts, MD;  Location: [place];  Service: Open Heart Surgery;  Laterality: N/A;   ARTERIAL LINE INSERTION Right 11/16/2017   Procedure: ARTERIAL LINE INSERTION -RIGHT FEMORAL;  Surgeon: Rexene Alberts, MD;  Location: [place];  Service: Open Heart Surgery;  Laterality: Right;   BACK SURGERY     lumbar back   CARDIOVERSION N/A 04/11/2018   Procedure: CARDIOVERSION;  Surgeon: Larey Dresser, MD;  Location: [facility] ENDOSCOPY;  Service: Cardiovascular;  Laterality: N/A;   CARDIOVERSION N/A 06/18/2021   Procedure: CARDIOVERSION;  Surgeon: Buford Dresser, MD;  Location: [facility] ENDOSCOPY;  Service: Cardiovascular;  Laterality: N/A;   CHOLECYSTECTOMY     CORONARY ARTERY BYPASS GRAFT N/A 11/16/2017   Procedure: CORONARY ARTERY BYPASS GRAFTING (CABG) x 1;  Surgeon: Rexene Alberts, MD;  Location: [place];  Service: Open Heart Surgery;  Laterality: N/A;   ENDOVEIN HARVEST OF GREATER SAPHENOUS VEIN Right 11/16/2017   Procedure: ENDOVEIN HARVEST OF GREATER SAPHENOUS VEIN;  Surgeon: Rexene Alberts, MD;  Location: [place];  Service: Open Heart Surgery;  Laterality: Right;   ERCP N/A 05/31/2013   Procedure: ENDOSCOPIC RETROGRADE CHOLANGIOPANCREATOGRAPHY (ERCP);  Surgeon: Ladene Artist, MD;  Location: [facility] END[facility];  Service: Endoscopy;  Laterality: N/A;   FOOT SURGERY     right   IR RADIOLOGY PERIPHERAL GUIDED IV START  09/28/2017   IR US GUIDE VASC ACCESS RIGHT  09/28/2017   LAPAROSCOPIC CHOLECYSTECTOMY SINGLE PORT N/A 06/01/2013   Procedure: LAPAROSCOPIC CHOLECYSTECTOMY SINGLE PORT;  Surgeon: Adin Hector, MD;  Location: [facility];  Service: General;  Laterality: N/A;   MAZE N/A 11/16/2017   Procedure: MAZE;  Surgeon: Rexene Alberts, MD;  Location: [place];  Service: Open Heart Surgery;  Laterality:  N/A;   MITRAL VALVE REPAIR N/A 11/16/2017   Procedure: MITRAL VALVE  REPLACEMENT with 1mm MagnaEase;  Surgeon: Rexene Alberts, MD;  Location: [place];  Service: Open Heart Surgery;  Laterality: N/A;   PACEMAKER IMPLANT N/A 11/21/2017   Procedure: PACEMAKER IMPLANT;  Surgeon: Evans Lance, MD;  Location: [place] CV LAB;  Service: Cardiovascular;  Laterality: N/A;   POLYPECTOMY     RIGHT/LEFT HEART CATH AND CORONARY ANGIOGRAPHY N/A 08/30/2017   Procedure: RIGHT/LEFT HEART CATH AND CORONARY ANGIOGRAPHY;  Surgeon: Larey Dresser, MD;  Location: [place] [facility];  Service: Cardiovascular;  Laterality: N/A;   SHOULDER ARTHROSCOPY WITH ROTATOR CUFF REPAIR AND SUBACROMIAL DECOMPRESSION Left 05/18/2017   SHOULDER ARTHROSCOPY WITH ROTATOR CUFF REPAIR AND SUBACROMIAL DECOMPRESSION Left 05/18/2017   Procedure: SHOULDER ARTHROSCOPY WITH ROTATOR CUFF REPAIR AND SUBACROMIAL DECOMPRESSION;  Surgeon: Tania Ade, MD;  Location[facility]  OR;  Service: Orthopedics;  Laterality: Left;  LEFT SHOULDER ARTHROSCOPY WITH ROTATOR CUFF REPAIR AND SUBACROMIAL DECOMPRESSION   TEE WITHOUT CARDIOVERSION N/A 05/09/2017   Procedure: TRANSESOPHAGEAL ECHOCARDIOGRAM (TEE);  Surgeon: Skeet Latch, MD;  Location: [place];  Service: Cardiovascular;  Laterality: N/A;   TEE WITHOUT CARDIOVERSION N/A 08/30/2017   Procedure: TRANSESOPHAGEAL ECHOCARDIOGRAM (TEE);  Surgeon: Larey Dresser, MD;  Location: [facility] ENDOSCOPY;  Service: Cardiovascular;  Laterality: N/A;   TEE WITHOUT CARDIOVERSION N/A 11/16/2017   Procedure: TRANSESOPHAGEAL ECHOCARDIOGRAM (TEE);  Surgeon: Rexene Alberts, MD;  Location: [place];  Service: Open Heart Surgery;  Laterality: N/A;   TEE WITHOUT CARDIOVERSION N/A 04/11/2018   Procedure: TRANSESOPHAGEAL ECHOCARDIOGRAM (TEE);  Surgeon: Larey Dresser, MD;  Location: [facility] ENDOSCOPY;  Service: Cardiovascular;  Laterality: N/A;   TEE WITHOUT CARDIOVERSION N/A 06/18/2021   Procedure: TRANSESOPHAGEAL ECHOCARDIOGRAM  (TEE);  Surgeon: Buford Dresser, MD;  Location: [facility] ENDOSCOPY;  Service: Cardiovascular;  Laterality: N/A;   TONSILLECTOMY       Current Facility-Administered Medications  Medication Dose Route Frequency Provider Last Rate Last Admin   0.9 %  sodium chloride infusion   Intravenous Continuous Constance Haw, MD 50 mL/hr at 12/16/21 0607 New Bag at 12/16/21 0607    Allergies:   Bee venom   Social History:  The patient  reports that he quit smoking about 5 years ago. His smoking use included cigarettes. He has a 28.50 pack-year smoking history. He has never used smokeless tobacco. He reports current alcohol use of about 4.0 - 6.0 standard drinks per week. He reports that he does not use drugs.   Family History:  The patient's family history includes Angina in his mother; Kidney cancer in his brother; Stroke in his father.   ROS:  Please see the history of present illness.   Otherwise, review of systems is positive for none.   All other systems are reviewed and negative.   PHYSICAL EXAM: VS:  BP (!) 133/45    Pulse 60    Temp 98.6 F (37 C) (Oral)    Resp 14    Ht 5\' 11"  (1.803 m)    Wt 117.9 kg    SpO2 94%    BMI 36.26 kg/m  , BMI Body mass index is 36.26 kg/m. GEN: Well nourished, well developed, in no acute distress  HEENT: normal  Neck: no JVD, carotid bruits, or masses Cardiac: RRR; no murmurs, rubs, or gallops,no edema  Respiratory:  clear to auscultation bilaterally, normal work of breathing GI: soft, nontender, nondistended, + BS MS: no deformity or atrophy  Skin: warm and dry Neuro:  Strength and sensation are intact Psych: euthymic mood, full affect  Recent Labs: 08/03/2021: B Natriuretic Peptide 113.8 10/07/2021: ALT 11; TSH 2.777 11/22/2021: BUN 31; Creatinine, Ser 2.12; Hemoglobin 13.1; Platelets 195; Potassium 4.1; Sodium 145    Lipid Panel     Component Value Date/Time   CHOL 125 10/07/2021 1244   CHOL 120 12/27/2012 0912   TRIG 84 10/07/2021 1244    TRIG 115 12/27/2012 0912   HDL 64 10/07/2021 1244   HDL 40 12/27/2012 0912   CHOLHDL 2.0 10/07/2021 1244   VLDL 17 10/07/2021 1244   LDLCALC 44 10/07/2021 1244   LDLCALC 57 12/27/2012 0912     Wt Readings from Last 3 Encounters:  12/16/21 117.9 kg  10/12/21 125.2 kg  10/07/21 125.2 kg      Other studies Reviewed: Additional studies/ records that were reviewed today include: TEE 06/18/21  Review of the above records today demonstrates:   1. Left ventricular ejection fraction, by estimation, is 50 to 55%. The  left ventricle has low normal function. The left ventricle demonstrates  regional wall motion abnormalities (see scoring diagram/findings for  description). There is hypokinesis of  the left ventricular, basal-mid septal wall.   2. Right ventricular systolic function is normal. The right ventricular  size is normal.   3. S/P LAA clip, no residual LAA noted. Left atrial size was mildly  dilated. No left atrial/left atrial appendage thrombus was detected.   4. Right atrial size was mildly dilated.   5. The mitral valve has been repaired/replaced. Trivial mitral valve  regurgitation. There is a 27 mm Edwards Magne Bovine pericardial  bioprosthetic valve present in the mitral position. Procedure Date: 2018.  Echo findings are consistent with normal  structure and function of the mitral valve prosthesis.   6. The aortic valve has been repaired/replaced. Aortic valve  regurgitation is not visualized. There is a 23 mm Magna valve present in  the aortic position. Procedure Date: 2018. Echo findings are consistent  with normal structure and function of the  aortic valve prosthesis.   7. There is Moderate (Grade III) plaque involving the descending aorta.    ASSESSMENT AND PLAN:  1.  Persistent atrial fibrillation/atypical flutter: Brandon [name] has presented today for surgery, with the diagnosis of atrial fibrillation.  The various methods of treatment have been discussed  with the patient and family. After consideration of risks, benefits and other options for treatment, the patient has consented to  Procedure(s): Catheter ablation as a surgical intervention .  Risks include but not limited to complete heart block, stroke, esophageal damage, nerve damage, bleeding, vascular damage, tamponade, perforation, MI, and death. The patient's history has been reviewed, patient examined, no change in status, stable for surgery.  I have reviewed the patient's chart and labs.  Questions were answered to the patient's satisfaction.    Orma Cheetham Curt Bears, MD 12/16/2021 7:05 AM

## 2021-12-16 NOTE — Anesthesia Postprocedure Evaluation (Signed)
Anesthesia Post Note  Patient: Brandon [name]  Procedure(s) Performed: ATRIAL FIBRILLATION ABLATION     Patient location during evaluation: Cath Lab Anesthesia Type: General Level of consciousness: awake and alert Pain management: pain level controlled Vital Signs Assessment: post-procedure vital signs reviewed and stable Respiratory status: spontaneous breathing, nonlabored ventilation, respiratory function stable and patient connected to nasal cannula oxygen Cardiovascular status: blood pressure returned to baseline and stable Postop Assessment: no apparent nausea or vomiting Anesthetic complications: no   There were no known notable events for this encounter.  Last Vitals:  Vitals:   12/16/21 1030 12/16/21 1038  BP: 136/61 (!) 145/61  Pulse: (!) 59 (!) 59  Resp: 15 16  Temp: 36.5 C   SpO2: 95% 95%    Last Pain:  Vitals:   12/16/21 0931  TempSrc:   PainSc: 0-No pain                 Asyia Hornung,W. EDMOND

## 2021-12-16 NOTE — Anesthesia Procedure Notes (Signed)
Procedure Name: Intubation Date/Time: 12/16/2021 7:46 AM Performed by: Myna Bright, CRNA Pre-anesthesia Checklist: Patient identified, Emergency Drugs available, Suction available and Patient being monitored Patient Re-evaluated:Patient Re-evaluated prior to induction Oxygen Delivery Method: Circle system utilized Preoxygenation: Pre-oxygenation with 100% oxygen Induction Type: IV induction Ventilation: Mask ventilation with difficulty and Oral airway inserted - appropriate to patient size Laryngoscope Size: Mac and 4 Grade View: Grade I Tube type: Oral Tube size: 7.5 mm Number of attempts: 1 Airway Equipment and Method: Stylet Placement Confirmation: ETT inserted through vocal cords under direct vision, positive ETCO2 and breath sounds checked- equal and bilateral Secured at: 22 cm Tube secured with: Tape Dental Injury: Teeth and Oropharynx as per pre-operative assessment

## 2021-12-17 MED FILL — Cefazolin Sodium-Dextrose IV Solution 2 GM/100ML-4%: INTRAVENOUS | Qty: 100 | Status: AC

## 2021-12-24 ENCOUNTER — Telehealth (HOSPITAL_COMMUNITY): Payer: Self-pay | Admitting: Pharmacist

## 2021-12-24 ENCOUNTER — Ambulatory Visit (INDEPENDENT_AMBULATORY_CARE_PROVIDER_SITE_OTHER): Payer: Medicare HMO

## 2021-12-24 ENCOUNTER — Other Ambulatory Visit: Payer: Self-pay

## 2021-12-24 DIAGNOSIS — Z951 Presence of aortocoronary bypass graft: Secondary | ICD-10-CM

## 2021-12-24 DIAGNOSIS — Z953 Presence of xenogenic heart valve: Secondary | ICD-10-CM

## 2021-12-24 DIAGNOSIS — I480 Paroxysmal atrial fibrillation: Secondary | ICD-10-CM | POA: Diagnosis not present

## 2021-12-24 DIAGNOSIS — Z8679 Personal history of other diseases of the circulatory system: Secondary | ICD-10-CM

## 2021-12-24 DIAGNOSIS — Z5181 Encounter for therapeutic drug level monitoring: Secondary | ICD-10-CM

## 2021-12-24 LAB — POCT INR: INR: 2.9 (ref 2.0–3.0)

## 2021-12-24 NOTE — Patient Instructions (Signed)
Description   - Continue taking Warfarin 1 tablet (2.5mg ) daily. - Recheck INR in 3 weeks.  Coumadin Clinic [phone].

## 2021-12-24 NOTE — Telephone Encounter (Signed)
Advanced Heart Failure Patient Advocate Encounter   Patient was approved to receive Entresto from [employer].   Patient ID: [id number] Effective dates: 12/12/21 through 12/11/22  Audry Riles, PharmD, BCPS, BCCP, CPP Heart Failure Clinic Pharmacist [phone]

## 2021-12-29 ENCOUNTER — Encounter: Payer: Self-pay | Admitting: Pulmonary Disease

## 2021-12-29 ENCOUNTER — Ambulatory Visit: Payer: Medicare HMO | Admitting: Pulmonary Disease

## 2021-12-29 ENCOUNTER — Other Ambulatory Visit: Payer: Self-pay

## 2021-12-29 VITALS — BP 128/64 | HR 106 | Temp 97.5°F | Ht 71.0 in | Wt 276.8 lb

## 2021-12-29 DIAGNOSIS — R0600 Dyspnea, unspecified: Secondary | ICD-10-CM

## 2021-12-29 DIAGNOSIS — J849 Interstitial pulmonary disease, unspecified: Secondary | ICD-10-CM | POA: Diagnosis not present

## 2021-12-29 NOTE — Progress Notes (Signed)
Brandon [name]    [id number]    [birth year]  Primary Care Physician:Webb, Arbie Cookey, MD  Referring Physician: Maurice Small, MD [address] [place],  [place] [address]  Chief complaint:   Follow-up for abnormal CT, concern for ILD  HPI: 76 year old ex-smoker with significant cardiac history of  CVA, paroxysmal atrial fibrillation, valvular heart disease, and chronic diastolic CHF.  He had a prolonged hospitalization in 2018 for heart failure, A. fib with RVR.  Eventually underwent aortic and mitral valve replacement, Maze procedure, LAA clipping  He was started on amiodarone around 2018 2019 and continues on at 800 mg a day.  Follows with Dr. Aundra Dubin in the heart failure clinic  Over the past few months he has noted increasing dyspnea on exertion.  He had a work-up done including PFTs and CT scan which showed changes suggestive of interstitial lung disease and has been referred here for further evaluation.  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  He did get a sed rate which shows slightly elevated at 46  Amiodarone was temporarily held however restarted in June 2022 as he is back in atrial fibrillation He had an  ILD panel which showed marked elevation of CCP.  Evaluated by rheumatology in early May 2022 with no findings of rheumatoid arthritis.  Elevated CCP was felt to be incidental  He was tried on Trelegy inhaler but was stopped after it made his breathing worse with increasing coughing  Pets: 2 cats Occupation: Retired Dealer at [employer]. Exposures: Exposure to chemicals while working.  No ongoing exposures.  No mold, hot tub, Jacuzzi.  No feather pillows or comforter Smoking history: 30-pack-year smoker.  Quit in 2018 Travel history: No significant travel history Relevant family history: No family history of lung disease  Interim history: States that breathing is doing well.  He continues to have issues with recurrent atrial fibrillation and  underwent recent ablation on 12/16/2021.  Continues on amiodarone.  Outpatient Encounter Medications as of 12/29/2021  Medication Sig   acetaminophen (TYLENOL) 500 MG tablet Take 1,000 mg by mouth every 6 (six) hours as needed (pain.).   amiodarone (PACERONE) 200 MG tablet Take 1 tablet (200 mg total) by mouth daily.   Azelastine HCl 137 MCG/SPRAY SOLN Squirt 1 puff in each nostril twice a day.   calcium carbonate (TUMS - DOSED IN MG ELEMENTAL CALCIUM) 500 MG chewable tablet Chew 1 tablet by mouth daily as needed for indigestion or heartburn.   carvedilol (COREG) 12.5 MG tablet Take 1 tablet (12.5 mg total) by mouth 2 (two) times daily with a meal.   cetirizine (ZYRTEC) 10 MG tablet Take 10 mg by mouth daily as needed for allergies.   citalopram (CELEXA) 20 MG tablet Take 1 tablet (20 mg total) by mouth at bedtime.   docusate sodium (COLACE) 100 MG capsule Take 1 capsule (100 mg total) by mouth 3 (three) times daily as needed.   empagliflozin (JARDIANCE) 10 MG TABS tablet TAKE 1 TABLET BY MOUTH ONCE A DAY BEFORE BREAKFAST   fluticasone (FLONASE) 50 MCG/ACT nasal spray Place into both nostrils as needed for allergies or rhinitis.   furosemide (LASIX) 40 MG tablet Take 1 tablet (40 mg total) by mouth daily. (Patient taking differently: Take 40 mg by mouth in the morning.)   loratadine (CLARITIN) 10 MG tablet Take 10 mg by mouth daily.   methocarbamol (ROBAXIN) 500 MG tablet Take 1 tablet (500 mg total) by mouth every  8 (eight) hours as needed for muscle spasms.   neomycin-bacitracin-polymyxin (NEOSPORIN) ointment Apply 1 application topically 2 (two) times daily as needed for wound care (on legs (wound)).   pantoprazole (PROTONIX) 40 MG tablet Take 1 tablet (40 mg total) by mouth daily.   Pseudoeph-Doxylamine-DM-APAP (NYQUIL PO) Take 30 mLs by mouth at bedtime as needed (cold symptoms).   rosuvastatin (CRESTOR) 10 MG tablet TAKE 1 TABLET EVERY DAY   sacubitril-valsartan (ENTRESTO) 49-51 MG Take 1  tablet by mouth 2 (two) times daily.   spironolactone (ALDACTONE) 25 MG tablet TAKE 1 TABLET EVERY DAY   traZODone (DESYREL) 50 MG tablet Take 100 mg by mouth at bedtime.   warfarin (COUMADIN) 2.5 MG tablet TAKE 1/2 TO 1 TABLET BY MOUTH ONCE DAILY AS DIRECTED BY COUMADIN CLINIC. DOSE CHANGE FROM 5MG  TO 2.5MG  TAB (Patient taking differently: Take 2.5 mg by mouth daily at 8 pm. TAKE 1/2 TO 1 TABLET BY MOUTH ONCE DAILY AS DIRECTED BY COUMADIN CLINIC. DOSE CHANGE FROM 5MG  TO 2.5MG  TAB)   No facility-administered encounter medications on file as of 12/29/2021.   Physical Exam: Blood pressure 128/64, pulse (!) 106, temperature (!) 97.5 F (36.4 C), temperature source Oral, height 5\' 11"  (1.803 m), weight 276 lb 12.8 oz (125.6 kg), SpO2 96 %. Gen:      No acute distress HEENT:  EOMI, sclera anicteric Neck:     No masses; no thyromegaly Lungs:    Clear to auscultation bilaterally; normal respiratory effort CV:         Regular rate and rhythm; no murmurs Abd:      + bowel sounds; soft, non-tender; no palpable masses, no distension Ext:    No edema; adequate peripheral perfusion Skin:      Warm and dry; no rash Neuro: alert and oriented x 3 Psych: normal mood and affect   Data Reviewed: Imaging: CT coronary 09/28/17-no interstitial lung disease, mild bibasal atelectasis  High-res CT 01/20/2021-bilateral groundglass attenuation most evident in the mid to lower lungs with mild reticulation, moderate air trapping.   High-res CT 05/08/2021-minimal nonspecific peripheral interstitial opacity, air trapping, bronchial wall thickening. I have reviewed the images personally  PFTs: 01/07/2021 FVC 2.70 (60%), FEV1 2.19 (16%), F/F 81, TLC 6.03 [82%], DLCO 13.20 [50%] Moderate diffusion defect  06/10/2021 FVC 2.95 [6%], FEV1 2.33 [73%], F/F 79, TLC 6.92 [95%], DLCO 17.20 (66%] Mild restriction, diffusion defect.  Improved compared to January 2022  Labs: Sed rate 01/29/21-46  CTD serologies  02/24/2021-positive for CCP greater than 250  Assessment:  Evaluation for interstitial lung disease CT scan which shows groundglass opacities with minimal reticulation.  The appearance of this is nonspecific and there is no clear evidence of interstitial lung disease or pulmonary fibrosis.  The groundglass changes may be from heart failure  Back on amiodarone due to recurrent atrial fibrillation Off inhalers its not helping with the breathing  Continue monitoring   Plan/Recommendations: - Follow-up CT in 1 year  Marshell Garfinkel MD  Pulmonary and Critical Care 12/29/2021, 2:23 PM  CC: Maurice Small, MD

## 2021-12-29 NOTE — Patient Instructions (Signed)
I am glad you are stable with regard to your breathing Will order high-res CT in 1 year and follow-up in clinic after that.

## 2021-12-29 NOTE — Progress Notes (Signed)
Brandon [name]    [id number]    [birth year]  Primary Care Physician:Webb, Arbie Cookey, MD  Referring Physician: Maurice Small, MD [place],  [place] [address]  Chief complaint:   Follow-up for abnormal CT, concern for ILD  HPI: 75 year old ex-smoker with significant cardiac history of  CVA, paroxysmal atrial fibrillation, valvular heart disease, and chronic diastolic CHF.  He had a prolonged hospitalization in 2018 for heart failure, A. fib with RVR.  Eventually underwent aortic and mitral valve replacement, Maze procedure, LAA clipping  He was started on amiodarone around 2018 2019 and continues on at 800 mg a day.  Follows with Dr. Aundra Dubin in the heart failure clinic  Over the past few months he has noted increasing dyspnea on exertion.  He had a work-up done including PFTs and CT scan which showed changes suggestive of interstitial lung disease and has been referred here for further evaluation.  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  No ongoing exposures.  No mold, hot tub, Jacuzzi.  No feather pillows or comforter Smoking history: 30-pack-year smoker.  Quit in 2018 Travel history: No significant travel history Relevant family history: No family history of lung disease  Interim history: Amiodarone was temporarily held however restarted as he is back in atrial fibrillation He had an  ILD panel which showed marked elevation of CCP.  Evaluated by rheumatology in early May 2022 with no findings of rheumatoid arthritis.  Elevated CCP was felt to be incidental  Tried on Trelegy inhaler.  Telephone note from wife states that it helped with breathing but patient tells me that the inhaler made him cough and he stopped using  Complains of sinus congestion, postnasal drip and chest  heaviness.  He is using over-the-counter Zyrtec without any effect Denies any cough, fevers, chills.  Outpatient Encounter Medications as of 12/29/2021  Medication Sig   acetaminophen (TYLENOL) 500 MG tablet Take 1,000 mg by mouth every 6 (six) hours as needed (pain.).   amiodarone (PACERONE) 200 MG tablet Take 1 tablet (200 mg total) by mouth daily.   Azelastine HCl 137 MCG/SPRAY SOLN Squirt 1 puff in each nostril twice a day.   calcium carbonate (TUMS - DOSED IN MG ELEMENTAL CALCIUM) 500 MG chewable tablet Chew 1 tablet by mouth daily as needed for indigestion or heartburn.   carvedilol (COREG) 12.5 MG tablet Take 1 tablet (12.5 mg total) by mouth 2 (two) times daily with a meal.   cetirizine (ZYRTEC) 10 MG tablet Take 10 mg by mouth daily as needed for allergies.   citalopram (CELEXA) 20 MG tablet Take 1 tablet (20 mg total) by mouth at bedtime.   docusate sodium (COLACE) 100 MG capsule Take 1 capsule (100 mg total) by mouth 3 (three) times daily as needed.   empagliflozin (JARDIANCE) 10 MG TABS tablet TAKE 1 TABLET BY MOUTH ONCE A DAY BEFORE BREAKFAST   fluticasone (FLONASE) 50 MCG/ACT nasal spray Place into both nostrils as needed for allergies or rhinitis.   furosemide (LASIX) 40 MG tablet Take 1 tablet (40 mg total) by mouth daily. (Patient taking differently: Take 40 mg by mouth in the morning.)   loratadine (CLARITIN) 10 MG tablet Take 10 mg by mouth daily.   methocarbamol (ROBAXIN) 500 MG tablet Take 1 tablet (500  mg total) by mouth every 8 (eight) hours as needed for muscle spasms.   neomycin-bacitracin-polymyxin (NEOSPORIN) ointment Apply 1 application topically 2 (two) times daily as needed for wound care (on legs (wound)).   pantoprazole (PROTONIX) 40 MG tablet Take 1 tablet (40 mg total) by mouth daily.   Pseudoeph-Doxylamine-DM-APAP (NYQUIL PO) Take 30 mLs by mouth at bedtime as needed (cold symptoms).   rosuvastatin (CRESTOR) 10 MG tablet TAKE 1 TABLET EVERY DAY    sacubitril-valsartan (ENTRESTO) 49-51 MG Take 1 tablet by mouth 2 (two) times daily.   spironolactone (ALDACTONE) 25 MG tablet TAKE 1 TABLET EVERY DAY   traZODone (DESYREL) 50 MG tablet Take 100 mg by mouth at bedtime.   warfarin (COUMADIN) 2.5 MG tablet TAKE 1/2 TO 1 TABLET BY MOUTH ONCE DAILY AS DIRECTED BY COUMADIN CLINIC. DOSE CHANGE FROM 5MG  TO 2.5MG  TAB (Patient taking differently: Take 2.5 mg by mouth daily at 8 pm. TAKE 1/2 TO 1 TABLET BY MOUTH ONCE DAILY AS DIRECTED BY COUMADIN CLINIC. DOSE CHANGE FROM 5MG  TO 2.5MG  TAB)   No facility-administered encounter medications on file as of 12/29/2021.   Physical Exam: Blood pressure 122/72, pulse 100, height 5\' 11"  (1.803 m), weight 268 lb (121.6 kg), SpO2 97 %. Gen:      No acute distress HEENT:  EOMI, sclera anicteric Neck:     No masses; no thyromegaly Lungs:    Clear to auscultation bilaterally; normal respiratory effort CV:         Regular rate and rhythm; no murmurs Abd:      + bowel sounds; soft, non-tender; no palpable masses, no distension Ext:    No edema; adequate peripheral perfusion Skin:      Warm and dry; no rash Neuro: alert and oriented x 3 Psych: normal mood and affect   Data Reviewed: Imaging: CT coronary 09/28/17-no interstitial lung disease, mild bibasal atelectasis  High-res CT 01/20/2021-bilateral groundglass attenuation most evident in the mid to lower lungs with mild reticulation, moderate air trapping.   High-res CT 05/08/2021-minimal nonspecific peripheral interstitial opacity, air trapping, bronchial wall thickening. I have reviewed the images personally  PFTs: 01/07/2021 FVC 2.70 (60%), FEV1 2.19 (16%), F/F 81, TLC 6.03 [82%], DLCO 13.20 [50%] Moderate diffusion defect  06/10/2021 FVC 2.95 [6%], FEV1 2.33 [73%], F/F 79, TLC 6.92 [95%], DLCO 17.20 (66%] Mild restriction, diffusion defect.  Improved compared to January 2022  Labs: Sed rate 01/29/21-46  CTD serologies 02/24/2021-positive for CCP greater  than 250  Assessment:  Evaluation for interstitial lung disease I have reviewed his CT scan which shows groundglass opacities with minimal reticulation.  The appearance of this is nonspecific and there is no clear evidence of interstitial lung disease or pulmonary fibrosis.  The groundglass changes may be from heart failure  Back on amiodarone due to recurrent atrial fibrillation Follow-up high-res CT shows minimal unchanged peripheral interstitial opacities.  His PFTs are actually better today compared to January  Off inhalers its not helping with the breathing  Sinus congestion Flonase, Claritin Prednisone 40 mg a day for 5 days  Plan/Recommendations: - Flonase, Claritin - Short prednisone taper  Marshell Garfinkel MD  Pulmonary and Critical Care 12/29/2021, 2:25 PM  CC: Maurice Small, MD

## 2021-12-30 ENCOUNTER — Other Ambulatory Visit (HOSPITAL_COMMUNITY): Payer: Self-pay

## 2021-12-30 ENCOUNTER — Encounter: Payer: Self-pay | Admitting: Internal Medicine

## 2021-12-30 ENCOUNTER — Ambulatory Visit: Payer: Medicare HMO | Admitting: Internal Medicine

## 2021-12-30 VITALS — BP 118/78 | HR 94 | Ht 71.0 in | Wt 275.0 lb

## 2021-12-30 DIAGNOSIS — Z950 Presence of cardiac pacemaker: Secondary | ICD-10-CM | POA: Diagnosis not present

## 2021-12-30 DIAGNOSIS — I5022 Chronic systolic (congestive) heart failure: Secondary | ICD-10-CM | POA: Diagnosis not present

## 2021-12-30 DIAGNOSIS — I442 Atrioventricular block, complete: Secondary | ICD-10-CM

## 2021-12-30 NOTE — Progress Notes (Signed)
HPI Mr. Nordahl returns today for followup. He is a pleasant 76 yo man with aortic and mitral valve disease, s/p surgery with heart block s/p PPM insertion. His conduction improved and when I saw him last we reprogrammed his device to avoid ventricular pacing. He has undergone repeat echo and his EF has improved from the 30 to 50% ranges. He feels well. No syncope or palpitations. he has been unable to lose weight. He developed worsening atrial fib and has undergone catheter ablation 2 weeks ago. He has done well in the interim. He has class 2-3 dyspnea. Allergies  Allergen Reactions   Bee Venom Anaphylaxis and Swelling     Current Outpatient Medications  Medication Sig Dispense Refill   acetaminophen (TYLENOL) 500 MG tablet Take 1,000 mg by mouth every 6 (six) hours as needed (pain.).     amiodarone (PACERONE) 200 MG tablet Take 1 tablet (200 mg total) by mouth daily. 45 tablet 3   Azelastine HCl 137 MCG/SPRAY SOLN Squirt 1 puff in each nostril twice a day. 30 mL 11   calcium carbonate (TUMS - DOSED IN MG ELEMENTAL CALCIUM) 500 MG chewable tablet Chew 1 tablet by mouth daily as needed for indigestion or heartburn.     carvedilol (COREG) 12.5 MG tablet Take 1 tablet (12.5 mg total) by mouth 2 (two) times daily with a meal. 180 tablet 3   cetirizine (ZYRTEC) 10 MG tablet Take 10 mg by mouth daily as needed for allergies.     citalopram (CELEXA) 20 MG tablet Take 1 tablet (20 mg total) by mouth at bedtime. 30 tablet 5   docusate sodium (COLACE) 100 MG capsule Take 1 capsule (100 mg total) by mouth 3 (three) times daily as needed. 20 capsule 0   empagliflozin (JARDIANCE) 10 MG TABS tablet TAKE 1 TABLET BY MOUTH ONCE A DAY BEFORE BREAKFAST 90 tablet 3   fluticasone (FLONASE) 50 MCG/ACT nasal spray Place into both nostrils as needed for allergies or rhinitis.     furosemide (LASIX) 40 MG tablet Take 1 tablet (40 mg total) by mouth daily. (Patient taking differently: Take 40 mg by mouth in  the morning.) 135 tablet 3   loratadine (CLARITIN) 10 MG tablet Take 10 mg by mouth daily.     methocarbamol (ROBAXIN) 500 MG tablet Take 1 tablet (500 mg total) by mouth every 8 (eight) hours as needed for muscle spasms. 30 tablet 0   neomycin-bacitracin-polymyxin (NEOSPORIN) ointment Apply 1 application topically 2 (two) times daily as needed for wound care (on legs (wound)).     pantoprazole (PROTONIX) 40 MG tablet Take 1 tablet (40 mg total) by mouth daily. 30 tablet 1   Pseudoeph-Doxylamine-DM-APAP (NYQUIL PO) Take 30 mLs by mouth at bedtime as needed (cold symptoms).     rosuvastatin (CRESTOR) 10 MG tablet TAKE 1 TABLET EVERY DAY 90 tablet 0   sacubitril-valsartan (ENTRESTO) 49-51 MG Take 1 tablet by mouth 2 (two) times daily. 180 tablet 3   spironolactone (ALDACTONE) 25 MG tablet TAKE 1 TABLET EVERY DAY 90 tablet 3   traZODone (DESYREL) 50 MG tablet Take 100 mg by mouth at bedtime.     warfarin (COUMADIN) 2.5 MG tablet TAKE 1/2 TO 1 TABLET BY MOUTH ONCE DAILY AS DIRECTED BY COUMADIN CLINIC. DOSE CHANGE FROM 5MG  TO 2.5MG  TAB (Patient taking differently: Take 2.5 mg by mouth daily at 8 pm. TAKE 1/2 TO 1 TABLET BY MOUTH ONCE DAILY AS DIRECTED BY COUMADIN CLINIC. DOSE CHANGE FROM 5MG   TO 2.5MG  TAB) 90 tablet 1   No current facility-administered medications for this visit.     Past Medical History:  Diagnosis Date   Anemia 07/13/2017   Anxiety    Aortic insufficiency    Aortic stenosis, moderate 07/13/2017   Arthritis    back    Carotid stenosis    Right carotid stent (widely patent) 40 - 59% left plaque 11/13   CHF (congestive heart failure) (HCC)    Chronic kidney disease    COPD (chronic obstructive pulmonary disease) ([place])    Coronary artery disease involving native coronary artery of native heart without angina pectoris    Depression    Dyslipidemia    GERD (gastroesophageal reflux disease)    Heart murmur    Hemiplegia affecting unspecified side, late effect of cerebrovascular  disease    resolved- from L side    Hypertension    Jaundice    resolved following ERCP & Cholecystectomy   Mild emphysema (HCC)    Mitral regurgitation    Mitral valve insufficiency and aortic valve insufficiency    Myocardial infarction ([place])    Paroxysmal atrial fibrillation (HCC)    Pre-diabetes    per spouse   S/P aortic valve replacement with bioprosthetic valve 11/16/2017   23 mm [facility] Ease stented bovine pericardial tissue valve   S/P CABG x 1 11/16/2017   SVG to PDA with [facility] via right thigh   S/P [place] operation for atrial fibrillation 11/16/2017   Left side lesion set using bipolar radiofrequency and cryothermy ablation with clipping of LA appendage   S/P mitral valve replacement with bioprosthetic valve 11/16/2017   27 mm [facility] Mitral stented bovine pericardial tissue valve   Sleep apnea    does not wear CPAP   Sleep concern    resulted in surgery- after + sleep test. Pt. doesn't have a problem any longer.    Stroke ([place]) 03/11/2003   stent placed on the 31, 3, 2004, L side    Wears glasses    Wears hearing aid in both ears    Wears partial dentures     ROS:   All systems reviewed and negative except as noted in the HPI.   Past Surgical History:  Procedure Laterality Date   AORTIC VALVE REPLACEMENT N/A 11/16/2017   Procedure: AORTIC VALVE REPLACEMENT (AVR) with 34mm Magna Ease;  Surgeon: Rexene Alberts, MD;  Location: [place];  Service: Open Heart Surgery;  Laterality: N/A;   ARTERIAL LINE INSERTION Right 11/16/2017   Procedure: ARTERIAL LINE INSERTION -RIGHT FEMORAL;  Surgeon: Rexene Alberts, MD;  Location: [place];  Service: Open Heart Surgery;  Laterality: Right;   ATRIAL FIBRILLATION ABLATION N/A 12/16/2021   Procedure: ATRIAL FIBRILLATION ABLATION;  Surgeon: Constance Haw, MD;  Location: [place] CV LAB;  Service: Cardiovascular;  Laterality: N/A;   BACK SURGERY     lumbar back   CARDIOVERSION N/A 04/11/2018   Procedure: CARDIOVERSION;   Surgeon: Larey Dresser, MD;  Location: [facility] ENDOSCOPY;  Service: Cardiovascular;  Laterality: N/A;   CARDIOVERSION N/A 06/18/2021   Procedure: CARDIOVERSION;  Surgeon: Buford Dresser, MD;  Location: [facility] ENDOSCOPY;  Service: Cardiovascular;  Laterality: N/A;   CHOLECYSTECTOMY     CORONARY ARTERY BYPASS GRAFT N/A 11/16/2017   Procedure: CORONARY ARTERY BYPASS GRAFTING (CABG) x 1;  Surgeon: Rexene Alberts, MD;  Location: [place];  Service: Open Heart Surgery;  Laterality: N/A;   ENDOVEIN HARVEST OF GREATER SAPHENOUS VEIN Right 11/16/2017  Procedure: ENDOVEIN HARVEST OF GREATER SAPHENOUS VEIN;  Surgeon: Rexene Alberts, MD;  Location: [place];  Service: Open Heart Surgery;  Laterality: Right;   ERCP N/A 05/31/2013   Procedure: ENDOSCOPIC RETROGRADE CHOLANGIOPANCREATOGRAPHY (ERCP);  Surgeon: Ladene Artist, MD;  Location: [facility]OS[facility];  Service: Endoscopy;  Laterality: N/A;   FOOT SURGERY     right   IR RADIOLOGY PERIPHERAL GUIDED IV START  09/28/2017   IR US GUIDE VASC ACCESS RIGHT  09/28/2017   LAPAROSCOPIC CHOLECYSTECTOMY SINGLE PORT N/A 06/01/2013   Procedure: LAPAROSCOPIC CHOLECYSTECTOMY SINGLE PORT;  Surgeon: Adin Hector, MD;  Location: [facility];  Service: General;  Laterality: N/A;   MAZE N/A 11/16/2017   Procedure: MAZE;  Surgeon: Rexene Alberts, MD;  Location: [place];  Service: Open Heart Surgery;  Laterality: N/A;   MITRAL VALVE REPAIR N/A 11/16/2017   Procedure: MITRAL VALVE  REPLACEMENT with 29mm MagnaEase;  Surgeon: Rexene Alberts, MD;  Location: [place];  Service: Open Heart Surgery;  Laterality: N/A;   PACEMAKER IMPLANT N/A 11/21/2017   Procedure: PACEMAKER IMPLANT;  Surgeon: Evans Lance, MD;  Location: [place] CV LAB;  Service: Cardiovascular;  Laterality: N/A;   POLYPECTOMY     RIGHT/LEFT HEART CATH AND CORONARY ANGIOGRAPHY N/A 08/30/2017   Procedure: RIGHT/LEFT HEART CATH AND CORONARY ANGIOGRAPHY;  Surgeon: Larey Dresser, MD;  Location: [place] CV LAB;  Service:  Cardiovascular;  Laterality: N/A;   SHOULDER ARTHROSCOPY WITH ROTATOR CUFF REPAIR AND SUBACROMIAL DECOMPRESSION Left 05/18/2017   SHOULDER ARTHROSCOPY WITH ROTATOR CUFF REPAIR AND SUBACROMIAL DECOMPRESSION Left 05/18/2017   Procedure: SHOULDER ARTHROSCOPY WITH ROTATOR CUFF REPAIR AND SUBACROMIAL DECOMPRESSION;  Surgeon: Tania Ade, MD;  Location: [place];  Service: Orthopedics;  Laterality: Left;  LEFT SHOULDER ARTHROSCOPY WITH ROTATOR CUFF REPAIR AND SUBACROMIAL DECOMPRESSION   TEE WITHOUT CARDIOVERSION N/A 05/09/2017   Procedure: TRANSESOPHAGEAL ECHOCARDIOGRAM (TEE);  Surgeon: Skeet Latch, MD;  Location: [place];  Service: Cardiovascular;  Laterality: N/A;   TEE WITHOUT CARDIOVERSION N/A 08/30/2017   Procedure: TRANSESOPHAGEAL ECHOCARDIOGRAM (TEE);  Surgeon: Larey Dresser, MD;  Location: [facility] ENDOSCOPY;  Service: Cardiovascular;  Laterality: N/A;   TEE WITHOUT CARDIOVERSION N/A 11/16/2017   Procedure: TRANSESOPHAGEAL ECHOCARDIOGRAM (TEE);  Surgeon: Rexene Alberts, MD;  Location: [place];  Service: Open Heart Surgery;  Laterality: N/A;   TEE WITHOUT CARDIOVERSION N/A 04/11/2018   Procedure: TRANSESOPHAGEAL ECHOCARDIOGRAM (TEE);  Surgeon: Larey Dresser, MD;  Location: [facility] ENDOSCOPY;  Service: Cardiovascular;  Laterality: N/A;   TEE WITHOUT CARDIOVERSION N/A 06/18/2021   Procedure: TRANSESOPHAGEAL ECHOCARDIOGRAM (TEE);  Surgeon: Buford Dresser, MD;  Location: [facility] ENDOSCOPY;  Service: Cardiovascular;  Laterality: N/A;   TONSILLECTOMY       Family History  Problem Relation Age of Onset   Stroke Father        No details   Angina Mother    Kidney cancer Brother      Social History   Socioeconomic History   Marital status: Married    Spouse name: Not on file   Number of children: 3   Years of education: Not on file   Highest education level: Not on file  Occupational History    Employer: RETIRED  Tobacco Use   Smoking status: Former    Packs/day: 0.50    Years: 57.00     Pack years: 28.50    Types: Cigarettes    Quit date: 2018    Years since quitting: 5.0   Smokeless tobacco: Never  Vaping  Use   Vaping Use: Former   Quit date: 07/13/2017  Substance and Sexual Activity   Alcohol use: Yes    Alcohol/week: 4.0 - 6.0 standard drinks    Types: 3 - 5 Glasses of wine, 1 Shots of liquor per week    Comment: moderate wine ; not since 8/ 2018   Drug use: No   Sexual activity: Yes  Other Topics Concern   Not on file  Social History Narrative   Two living children.  Lives with wife.     Social Determinants of Health   Financial Resource Strain: Not on file  Food Insecurity: Not on file  Transportation Needs: Not on file  Physical Activity: Not on file  Stress: Not on file  Social Connections: Not on file  Intimate Partner Violence: Not on file     BP 118/78    Pulse 94    Ht 5\' 11"  (1.803 m)    Wt 275 lb (124.7 kg)    SpO2 94%    BMI 38.35 kg/m   Physical Exam:  Well appearing NAD HEENT: Unremarkable Neck:  No JVD, no thyromegally Lymphatics:  No adenopathy Back:  No CVA tenderness Lungs:  Clear with no wheezes HEART:  Regular rate rhythm, no murmurs, no rubs, no clicks Abd:  soft, positive bowel sounds, no organomegally, no rebound, no guarding Ext:  2 plus pulses, no edema, no cyanosis, no clubbing Skin:  No rashes no nodules Neuro:  CN II through XII intact, motor grossly intact   DEVICE  Normal device function.  See PaceArt for details.   Assess/Plan:  AV block - he has significant conduction system disease with long first degree AV block. However we will continue to let him not pace.  PPM -his medtronic DDD PM is working normally. Chronic systolic and diastolic heart failure - he has class 2 symptoms. Obesity - his weigh is down 10 lbs. Hopefully this will continue.  Carleene Overlie Nevin Kozuch,MD

## 2021-12-30 NOTE — Patient Instructions (Addendum)
Medication Instructions:  Your physician recommends that you continue on your current medications as directed. Please refer to the Current Medication list given to you today.  Labwork: None ordered.  Testing/Procedures: None ordered.  Follow-Up: Your physician wants you to follow-up with Dr. Curt Bears.  Remote monitoring is used to monitor your Pacemaker from home. This monitoring reduces the number of office visits required to check your device to one time per year. It allows [place] to keep an eye on the functioning of your device to ensure it is working properly. You are scheduled for a device check from home on 03/03/2022. You may send your transmission at any time that day. If you have a wireless device, the transmission will be sent automatically. After your physician reviews your transmission, you will receive a postcard with your next transmission date.  Any Other Special Instructions Will Be Listed Below (If Applicable).  If you need a refill on your cardiac medications before your next appointment, please call your pharmacy.

## 2021-12-31 ENCOUNTER — Other Ambulatory Visit (HOSPITAL_COMMUNITY): Payer: Self-pay

## 2021-12-31 DIAGNOSIS — I5032 Chronic diastolic (congestive) heart failure: Secondary | ICD-10-CM | POA: Diagnosis not present

## 2021-12-31 DIAGNOSIS — I4430 Unspecified atrioventricular block: Secondary | ICD-10-CM | POA: Diagnosis not present

## 2021-12-31 DIAGNOSIS — F331 Major depressive disorder, recurrent, moderate: Secondary | ICD-10-CM | POA: Diagnosis not present

## 2021-12-31 DIAGNOSIS — F5101 Primary insomnia: Secondary | ICD-10-CM | POA: Diagnosis not present

## 2021-12-31 DIAGNOSIS — I739 Peripheral vascular disease, unspecified: Secondary | ICD-10-CM | POA: Diagnosis not present

## 2021-12-31 DIAGNOSIS — J439 Emphysema, unspecified: Secondary | ICD-10-CM | POA: Diagnosis not present

## 2021-12-31 DIAGNOSIS — Z950 Presence of cardiac pacemaker: Secondary | ICD-10-CM | POA: Diagnosis not present

## 2021-12-31 DIAGNOSIS — I10 Essential (primary) hypertension: Secondary | ICD-10-CM | POA: Diagnosis not present

## 2021-12-31 DIAGNOSIS — I480 Paroxysmal atrial fibrillation: Secondary | ICD-10-CM | POA: Diagnosis not present

## 2021-12-31 MED ORDER — AZELASTINE HCL 137 MCG/SPRAY NA SOLN
NASAL | 3 refills | Status: DC
  Filled 2021-12-31: qty 60, 90d supply, fill #0

## 2021-12-31 MED ORDER — PANTOPRAZOLE SODIUM 40 MG PO TBEC
40.0000 mg | DELAYED_RELEASE_TABLET | Freq: Every day | ORAL | 3 refills | Status: AC
  Filled 2021-12-31: qty 90, 90d supply, fill #0

## 2021-12-31 MED ORDER — CITALOPRAM HYDROBROMIDE 20 MG PO TABS
20.0000 mg | ORAL_TABLET | Freq: Every day | ORAL | 3 refills | Status: DC
  Filled 2021-12-31: qty 90, 90d supply, fill #0

## 2021-12-31 MED ORDER — TRAZODONE HCL 50 MG PO TABS
100.0000 mg | ORAL_TABLET | Freq: Every evening | ORAL | 3 refills | Status: DC | PRN
  Filled 2021-12-31: qty 180, 90d supply, fill #0

## 2022-01-03 ENCOUNTER — Other Ambulatory Visit (HOSPITAL_COMMUNITY): Payer: Self-pay

## 2022-01-04 ENCOUNTER — Other Ambulatory Visit (HOSPITAL_COMMUNITY): Payer: Self-pay

## 2022-01-10 ENCOUNTER — Other Ambulatory Visit (HOSPITAL_COMMUNITY): Payer: Self-pay

## 2022-01-10 ENCOUNTER — Encounter (HOSPITAL_COMMUNITY): Payer: Self-pay

## 2022-01-10 ENCOUNTER — Encounter: Payer: Self-pay | Admitting: Family Medicine

## 2022-01-10 ENCOUNTER — Other Ambulatory Visit: Payer: Self-pay

## 2022-01-10 ENCOUNTER — Ambulatory Visit (HOSPITAL_COMMUNITY)
Admission: RE | Admit: 2022-01-10 | Discharge: 2022-01-10 | Disposition: A | Discharge status: Home / Self Care | Payer: Medicare HMO | Source: Ambulatory Visit | Attending: Family Medicine | Admitting: Family Medicine

## 2022-01-10 VITALS — BP 90/60 | HR 112 | Wt 272.4 lb

## 2022-01-10 DIAGNOSIS — Z953 Presence of xenogenic heart valve: Secondary | ICD-10-CM

## 2022-01-10 DIAGNOSIS — I4892 Unspecified atrial flutter: Secondary | ICD-10-CM | POA: Insufficient documentation

## 2022-01-10 DIAGNOSIS — L03116 Cellulitis of left lower limb: Secondary | ICD-10-CM | POA: Diagnosis not present

## 2022-01-10 DIAGNOSIS — Z7901 Long term (current) use of anticoagulants: Secondary | ICD-10-CM | POA: Insufficient documentation

## 2022-01-10 DIAGNOSIS — Z8673 Personal history of transient ischemic attack (TIA), and cerebral infarction without residual deficits: Secondary | ICD-10-CM | POA: Diagnosis not present

## 2022-01-10 DIAGNOSIS — E669 Obesity, unspecified: Secondary | ICD-10-CM | POA: Diagnosis not present

## 2022-01-10 DIAGNOSIS — J449 Chronic obstructive pulmonary disease, unspecified: Secondary | ICD-10-CM | POA: Insufficient documentation

## 2022-01-10 DIAGNOSIS — I5022 Chronic systolic (congestive) heart failure: Secondary | ICD-10-CM

## 2022-01-10 DIAGNOSIS — D509 Iron deficiency anemia, unspecified: Secondary | ICD-10-CM

## 2022-01-10 DIAGNOSIS — Z950 Presence of cardiac pacemaker: Secondary | ICD-10-CM

## 2022-01-10 DIAGNOSIS — J441 Chronic obstructive pulmonary disease with (acute) exacerbation: Secondary | ICD-10-CM

## 2022-01-10 DIAGNOSIS — I2510 Atherosclerotic heart disease of native coronary artery without angina pectoris: Secondary | ICD-10-CM | POA: Diagnosis not present

## 2022-01-10 DIAGNOSIS — I480 Paroxysmal atrial fibrillation: Secondary | ICD-10-CM | POA: Diagnosis not present

## 2022-01-10 DIAGNOSIS — Z6837 Body mass index (BMI) 37.0-37.9, adult: Secondary | ICD-10-CM | POA: Diagnosis not present

## 2022-01-10 DIAGNOSIS — Z87891 Personal history of nicotine dependence: Secondary | ICD-10-CM | POA: Insufficient documentation

## 2022-01-10 DIAGNOSIS — I10 Essential (primary) hypertension: Secondary | ICD-10-CM

## 2022-01-10 DIAGNOSIS — J849 Interstitial pulmonary disease, unspecified: Secondary | ICD-10-CM

## 2022-01-10 DIAGNOSIS — E785 Hyperlipidemia, unspecified: Secondary | ICD-10-CM

## 2022-01-10 DIAGNOSIS — N183 Chronic kidney disease, stage 3 unspecified: Secondary | ICD-10-CM

## 2022-01-10 DIAGNOSIS — G4733 Obstructive sleep apnea (adult) (pediatric): Secondary | ICD-10-CM

## 2022-01-10 DIAGNOSIS — Z79899 Other long term (current) drug therapy: Secondary | ICD-10-CM | POA: Insufficient documentation

## 2022-01-10 DIAGNOSIS — I442 Atrioventricular block, complete: Secondary | ICD-10-CM

## 2022-01-10 DIAGNOSIS — I130 Hypertensive heart and chronic kidney disease with heart failure and stage 1 through stage 4 chronic kidney disease, or unspecified chronic kidney disease: Secondary | ICD-10-CM | POA: Insufficient documentation

## 2022-01-10 LAB — BRAIN NATRIURETIC PEPTIDE: B Natriuretic Peptide: 250.4 pg/mL — ABNORMAL HIGH (ref 0.0–100.0)

## 2022-01-10 LAB — CBC
HCT: 39.2 % (ref 39.0–52.0)
Hemoglobin: 12.9 g/dL — ABNORMAL LOW (ref 13.0–17.0)
MCH: 33.9 pg (ref 26.0–34.0)
MCHC: 32.9 g/dL (ref 30.0–36.0)
MCV: 103.2 fL — ABNORMAL HIGH (ref 80.0–100.0)
Platelets: 189 10*3/uL (ref 150–400)
RBC: 3.8 MIL/uL — ABNORMAL LOW (ref 4.22–5.81)
RDW: 15.1 % (ref 11.5–15.5)
WBC: 5.1 10*3/uL (ref 4.0–10.5)
nRBC: 0 % (ref 0.0–0.2)

## 2022-01-10 LAB — BASIC METABOLIC PANEL
Anion gap: 11 (ref 5–15)
BUN: 35 mg/dL — ABNORMAL HIGH (ref 8–23)
CO2: 23 mmol/L (ref 22–32)
Calcium: 8.8 mg/dL — ABNORMAL LOW (ref 8.9–10.3)
Chloride: 105 mmol/L (ref 98–111)
Creatinine, Ser: 2.19 mg/dL — ABNORMAL HIGH (ref 0.61–1.24)
GFR, Estimated: 31 mL/min — ABNORMAL LOW (ref >60)
Glucose, Bld: 112 mg/dL — ABNORMAL HIGH (ref 70–99)
Potassium: 4.4 mmol/L (ref 3.5–5.1)
Sodium: 139 mmol/L (ref 135–145)

## 2022-01-10 MED ORDER — FUROSEMIDE 40 MG PO TABS: 60.0000 mg | ORAL_TABLET | Freq: Every day | ORAL | 3 refills | Status: DC

## 2022-01-10 MED ORDER — DOXYCYCLINE HYCLATE 50 MG PO CAPS
100.0000 mg | ORAL_CAPSULE | Freq: Two times a day (BID) | ORAL | 0 refills | Status: DC
  Filled 2022-01-10: qty 20, 5d supply, fill #0

## 2022-01-10 MED ORDER — FUROSEMIDE 40 MG PO TABS: 40.0000 mg | ORAL_TABLET | Freq: Every day | ORAL | 0 refills | Status: DC

## 2022-01-10 MED ORDER — ENTRESTO 24-26 MG PO TABS: 1.0000 | ORAL_TABLET | Freq: Two times a day (BID) | ORAL | 3 refills | Status: DC

## 2022-01-10 NOTE — Progress Notes (Addendum)
Advanced Heart Failure Clinic Note   PCP: Dr. Justin Mend Cardiology: Dr. Radford Pax HF Cardiology: Dr. Aundra Dubin  76 y.o.with history of CVA, paroxysmal atrial fibrillation, valvular heart disease, and chronic diastolic CHF presents for evaluation of CHF and valvular heart disease.    He had had episodes of dyspnea and initial workup led to a TEE in 5/18 showing normal EF with moderate AS and moderate MR.  He continued to have episodic dyspnea and ended up admitted in 8/18 with shortness of breath and chest pressure.  This led to a long, complicated hospitalization.  He was noted to be volume overloaded with acute diastolic CHF and was also noted to have symptomatic runs of atrial fibrillation with RVR.  Amiodarone was started to control the atrial fibrillation.  He developed respiratory distress => Bipap => intubated.  He became hypotensive, requiring pressors.  He developed AKI.  PNA was noted and he received broad spectrum abx.  He had a prolonged intubation but was eventually extubated.  Given deconditioning from long hospitalization, he went to CIR for a couple of weeks.  LHC in 9/18 showed occluded PDA with left>right collaterals.  TEE in 9/18 showed EF 50%, severe MR (possibly infarct-related with restricted posterior leaflet, and moderate AS + moderate-severe AI (possibly rheumatic).   In 12/18, he had cardiac surgery with bioprosthetic aortic and mitral valves placed.  He had SVG-PDA, Maze, and LA appendage clipping.  Post-op course was complicated by CHB requiring Medtronic dual chamber PPM (His bundle lead placed but captured right bundle so induced LBBB).   Echo 01/02/18 LVEF 30-35%, bioprosthetic MV and AoV function normally, RV mildly dilated with normal systolic function.    He was noted to be in atrial flutter with mild RVR and had TEE-guided DCCV on 04/11/18. TEE showed EF 25-30%, normal bioprosthetic aortic and mitral valves.  Echo in 6/19 showed EF 30-35%, normal bioprosthetic aortic and mitral  valves.  Echo 12/19 with EF up to 50-55%, normal bioprosthetic mitral and aortic valves, normal RV.   In 2020, he went back into atrial fibrillation.  Amiodarone was started, and he returned to NSR.   Echo in 11/21 showed EF 50-55%, mild LVH, mildly dilated RV with normal function, bioprosthetic mitral valve with mean gradient 5 mmHg and no MR, bioprosthetic aortic valve with mean gradient 12 and no AI.  PFTs in 1/22 were restrictive.  High resolution CT chest was done in 2/22, concerning for ILD, usual interstitial pneumonitis.  He saw pulmonary, ESR was mildly elevated.  We decided to try him off amiodarone given concern for possible amiodarone-induced lung toxicity.  He then developed atrial fibrillation off amiodarone, was quite symptomatic.  He underwent TEE-guided DCCV back to NSR in 7/22 and amiodarone was restarted.  TEE showed EF 50-55%, mid septal HK, RV normal, bioprosthetic aortic and mitral valves appeared normal.   He has had runs of atrial fibrillation, and amiodarone has been increased back to 200 mg daily.   S/p AF ablation 12/16/21.  Follow up with pulmonary 12/29/21, rec follow up CT 1 year.   Today he returns for HF follow up with his wife. They have noticed more dyspnea over the past 1-2 months with ADLs. No significant dyspnea with walking short distances on flat ground. Noticed some swelling in legs and has red itchy area on left leg, treated with neosporin + Vaseline. Denies CP, dizziness, palpitations, abnormal bleeding or PND/Orthopnea. Appetite ok. No fever or chills. Weight at home 268 pounds. Taking all medications.  ReDs: 38%  Device interrogation (personally reviewed): v-pacing 12%, a-pacing 2.8%, 1.1 hour/day activity.  Labs (9/18): K 4.5, creatinine 1.42, LDL 90, HDL 36, LFTs normal, TSH normal, hgb 9.6 Labs (10/18): K 4.3, creatinine 1.45 Labs (12/18): K 3.8, creatinine 1.56 Labs (1/19): creatinine 1.68, LDL 66 Labs (3/19): K 4.2, creatinine 1.56 Labs (4/19): K  4.4, creatinine 1.71, hgb 11.8, LDL 75, HDL 43 Labs (6/19): K 4.8, creatinine 2.1, LDL 75, TGs 246 Labs (8/19): K 4.6, creatinine 2.07 Labs (11/19): K 4.6, creatinine 1.29 Labs (2/20): K 4.8, creatinine 1.98 Labs (5/20): LDL 49, HDL 39 Labs (7/20): TSH normal Labs (9/20): TSH normal, K 4.4, creatinine 2.03, hgb 13.5, LFTs normal Labs (12/20): K 4.4, creatinine 1.87, hgb 13.1.  Labs (11/21): LDL 54, K 4.6, creatinine 2.22, LFTs normal, TSH normal Labs (7/22): TSH normal, K 4.5, creatinine 2.98, hgb 13.7 Labs (8/22): K 4.4, creatinine 2.23 Labs (10/22): LDL 44, HDL 64, LFTs and TSH normal Labs (12/22): K4.1, creatinine 2.12  PMH: 1. CVA: Right MCA, 2004.  Minimal residual problems.  2. HTN - Peripheral edema with amlodipine.  3. Hyperlipidemia 4. Left rotator cuff surgery 6/18 5. Carotid stenosis: s/p right carotid stent.  6. GERD 7. H/o CCY 8. Anemia: FOBT negative.  9. Gout  10. Atrial fibrillation: Paroxysmal. Maze in 12/18 with LAA clipping.  - Recurrent atrial fibrillation 2020 => amiodarone.  - Recurrent atrial fibrillation off amiodarone in 7/22, DCCV to NSR and amiodarone restarted.  - AF ablation 12/16/20. Amiodarone continued. 11. Valvular heart disease: TEE (5/18) with EF 55-60%, moderate AS mean 33 mmHg, moderate MR, normal RV size and systolic function.  - Echo (8/18): EF 55-60%, moderate LVH, moderate AS with mean gradient 33 mmHg, moderate AI, moderate to severe MR.  - TEE (9/18): EF 50%, mild LV dilation, suspect severe MR with posterior leaflet restricted (?infarct-related MR), ERO 0.4 cm^2, moderate AS/moderate-severe AI (possibly rheumatic).   - In 12/18, he had cardiac surgery with bioprosthetic aortic and mitral valves placed.  He had SVG-PDA, Maze, and LA appendage clipping. - Echo (1/19): LVEF 30-35%, bioprosthetic MV and AoV function normally, RV mildly dilated with normal systolic function.  - TEE (5/19) with EF 25-30%, normal bioprosthetic aortic valve, normal  bioprosthetic mitral valve. - Echo (6/19): EF 30-35%, mild LV dilation with diffuse hypokinesis, normal RV size with mildly decreased systolic function, normal-appearing bioprosthetic mitral and aortic valves.   - Echo (12/19): EF 50-55%, moderate LVH, bioprosthetic aortic valve with mean gradient 10 mmHg, normally-functioning bioprosthetic mitral valve.  - Echo (11/21): EF 50-55%, mild LVH, mildly dilated RV with normal function, bioprosthetic mitral valve with mean gradient 5 mmHg and no MR, bioprosthetic aortic valve with mean gradient 12 and no AI.   - TEE (7/22): EF 50-55%, septal HK, normal RV size/systolic function, LA appendage clipped, bioprosthetic aortic and mitral valves appeared normal.  12. Chronic systolic CHF 13. Deafness 14. CKD: stage 3.  15. 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  17. COPD: Mild obstruction on 9/18 PFTs.  - PFTs (10/20): Mild obstruction, decreased DLCO.  18. Post-op CHB in 12/18 with Medtronic dual chamber PPM.  19. Chronic systolic CHF: Echo (6/57) with EF down to 30-35% post-op.  - TEE (5/19) with EF 25-30%, normal bioprosthetic aortic valve, normal bioprosthetic mitral valve.  - Echo (12/19): EF 50-55%, moderate LVH, bioprosthetic aortic valve  with mean gradient 10 mmHg, normally-functioning bioprosthetic mitral valve.  20. Atrial flutter: DCCV in 5/19.  21. OSA: Not using CPAP.  22. Interstitial lung disease: PFTs in 1/22 concerning for restriction.  High resolution chest CT in 2/22 with possible UIP.  - Chest CT (1/23): w/ groundglass changes, felt nonspecific and no clear evidence of ILD or pulmonary fibrosis.  SH: Quit smoking 8/18.  Married, 2 children, retired.    Family History  Problem Relation Age of Onset   Stroke Father        No details   Angina Mother    Kidney cancer Brother    Review of  systems complete and found to be negative unless listed in HPI.    Current Outpatient Medications  Medication Sig Dispense Refill   acetaminophen (TYLENOL) 500 MG tablet Take 1,000 mg by mouth every 6 (six) hours as needed (pain.).     amiodarone (PACERONE) 200 MG tablet Take 1 tablet (200 mg total) by mouth daily. 45 tablet 3   Azelastine HCl 137 MCG/SPRAY SOLN Use 1 spray in each nostril 2 times a day 90 mL 3   calcium carbonate (TUMS - DOSED IN MG ELEMENTAL CALCIUM) 500 MG chewable tablet Chew 1 tablet by mouth daily as needed for indigestion or heartburn.     carvedilol (COREG) 12.5 MG tablet Take 1 tablet (12.5 mg total) by mouth 2 (two) times daily with a meal. 180 tablet 3   cetirizine (ZYRTEC) 10 MG tablet Take 10 mg by mouth daily as needed for allergies.     citalopram (CELEXA) 20 MG tablet Take 1 tablet (20 mg total) by mouth at bedtime. 30 tablet 5   docusate sodium (COLACE) 100 MG capsule Take 1 capsule (100 mg total) by mouth 3 (three) times daily as needed. 20 capsule 0   empagliflozin (JARDIANCE) 10 MG TABS tablet TAKE 1 TABLET BY MOUTH ONCE A DAY BEFORE BREAKFAST 90 tablet 3   fluticasone (FLONASE) 50 MCG/ACT nasal spray Place into both nostrils as needed for allergies or rhinitis.     furosemide (LASIX) 40 MG tablet Take 1 tablet (40 mg total) by mouth daily. (Patient taking differently: Take 40 mg by mouth in the morning.) 135 tablet 3   loratadine (CLARITIN) 10 MG tablet Take 10 mg by mouth daily as needed.     methocarbamol (ROBAXIN) 500 MG tablet Take 1 tablet (500 mg total) by mouth every 8 (eight) hours as needed for muscle spasms. 30 tablet 0   neomycin-bacitracin-polymyxin (NEOSPORIN) ointment Apply 1 application topically 2 (two) times daily as needed for wound care (on legs (wound)).     pantoprazole (PROTONIX) 40 MG tablet Take 1 tablet (40 mg total) by mouth daily. 90 tablet 3   Pseudoeph-Doxylamine-DM-APAP (NYQUIL PO) Take 30 mLs by mouth at bedtime as needed (cold  symptoms).     rosuvastatin (CRESTOR) 10 MG tablet TAKE 1 TABLET EVERY DAY 90 tablet 0   sacubitril-valsartan (ENTRESTO) 49-51 MG Take 1 tablet by mouth 2 (two) times daily. 180 tablet 3   spironolactone (ALDACTONE) 25 MG tablet TAKE 1 TABLET EVERY DAY 90 tablet 3   traZODone (DESYREL) 50 MG tablet Take 2 tablets (100 mg total) by mouth at bedtime as needed. 180 tablet 3   warfarin (COUMADIN) 2.5 MG tablet TAKE 1/2 TO 1 TABLET BY MOUTH ONCE DAILY AS DIRECTED BY COUMADIN CLINIC. DOSE CHANGE FROM 5MG TO 2.5MG TAB (Patient taking differently: Take 2.5 mg by mouth daily at 8 pm. TAKE 1/2  TO 1 TABLET BY MOUTH ONCE DAILY AS DIRECTED BY COUMADIN CLINIC. DOSE CHANGE FROM $RemoveBef'5MG'GdHTAwGCZG$  TO 2.$Rem'5MG'MALE$  TAB) 90 tablet 1   No current facility-administered medications for this encounter.   BP 90/60    Pulse (!) 112    Wt 123.6 kg (272 lb 6.4 oz)    SpO2 94%    BMI 37.99 kg/m   Wt Readings from Last 3 Encounters:  01/10/22 123.6 kg (272 lb 6.4 oz)  12/30/21 124.7 kg (275 lb)  12/29/21 125.6 kg (276 lb 12.8 oz)   General:  NAD. No resp difficulty HEENT: + HOH Neck: Supple. No JVD. Carotids 2+ bilat; no bruits. No lymphadenopathy or thryomegaly appreciated. Cor: PMI nondisplaced. Regular rate & rhythm. No rubs, gallops, 2/6 SEM RUSB Lungs: Clear Abdomen: Obese, nontender, nondistended. No hepatosplenomegaly. No bruits or masses. Good bowel sounds. Extremities: No cyanosis, clubbing, rash, 1-2+ BLE edema; +cellulitis LLE Neuro: Alert & oriented x 3, cranial nerves grossly intact. Moves all 4 extremities w/o difficulty. Affect pleasant.  Assessment/Plan: 1. Valvular heart disease: TEE in 9/18 showed severe MR, probably infarct-related with restrictive posterior mitral leaflet. There was moderate aortic stenosis and moderate-severe aortic insufficiency, possible rheumatic.  In 12/18, he had bioprosthetic MVR and AVR. Valves stable on TEE in 7/22. - Antibiotic prophylaxis needed with dental work.  2. Chronic systolic =>  diastolic CHF: In setting of significant valvular disease as above. EF down to 30-35% post-op (1/19 echo), likely reflects true LV systolic function without the volume load from severe MR and moderate-severe AI.  TEE in 5/19 showed EF 25-30%.  Repeat echo in 6/19 with EF 30-35%. Repeat echo in 12/19 showed EF up to 50-55%, echo in 11/21 again showed EF 50-55% with normal RV systolic function.  TEE in 7/22 showed EF 50-55% with normal RV.  Worse NYHA III- early IIIb symptoms, functional status confounded by body habitus and general physical deconditioning. He is mildly volume overloaded today, ReDs 38%. GDMT by CKD stage 3.   - With low BP, decrease Entresto to 24/26 bid. - Increase Lasix to 40 mg bid x 3 days then 60 mg daily.  BMET/BNP today and BMETin 10 days.    - Continue Coreg 12.5 mg bid.   - Continue spironolactone 25 mg daily. - Continue Jardiance 10 mg daily.  3. CKD: Stage 3. BMET today. - As above, continue Jardiance.  4. Atrial fibrillation/flutter: Paroxysmal. He has had a Maze procedure.  He tends to tolerate atrial arrhythmias poorly.  He had atrial flutter with DCCV in 5/19.  Recurrent atrial fibrillation in 2020.  Stopped amiodarone and went into atrial fibrillation in 6/22, very symptomatic.  Amiodarone restarted and he was cardioverted to NSR in 7/22.  Amiodarone was subsequently increased to 200 mg daily with transient AF runs. S/p AF ablation 12/16/21. - He is on warfarin.  - Continue amiodarone 200 mg daily for now.  LFTs, TSH ok 10/22.  He will need an eye exam. CT chest in past showed possible early ILD, ESR was only mildly elevated.  PFTs (11/22) showed early interstitial process. He saw pulmonary, not definite amiodarone-induced lung toxicity but need to follow closely.  - Now s/p atrial fibrillation ablation, hopefully this will allow [place] to get him off amiodarone. He has follow up with EP 4/23. 5. COPD: Mild obstruction on 10/20 PFTs, decreased DLCO.  He has quit smoking.   -  PFTs 11/22 showed early interstitial process.  - Follows with pulmonary.  6. CVA: Remote, minimal residual. S/p  carotid stenting.  - No ASA given warfarin use.  7. Hyperlipidemia: Continue Crestor.      8. Anemia: Fe deficiency.   9. 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  The artery to the PDA was occluded with left to right collaterals.  He had SVG-PDA in 12/18. No chest pain.  - Continue statin, lipids ok 10/22.    - No ASA given warfarin use.  10. CHB: has Medtronic PPM.  He a-paces a high percentage of the time but there is minimal RV pacing.  11. OSA: Severe OSA, he does not want to use CPAP.  12. Obesity: We discussed diet/exercise for weight loss. Body mass index is 37.99 kg/m. - Consider referral to pharmacy for semaglutide. 13. HTN: BP now on the low side. Decrease Entresto as above. 14. Interstitial lung disease: Restrictive PFTs in 1/22.  High resolution CT chest concerning for ILD, possibly usual interstitial pneumonitis.  He saw pulmonary, could not rule out amiodarone lung toxicity so tried him off amiodarone.  He quickly went back into atrial fibrillation that was tolerated poorly, so amiodarone restarted.  Will continue for now.  - PFTs (11/22) showed early interstitial process. - Continue to followup with pulmonary.  - Hopefully we can stop amiodarone safely in the future.  15. Cellulitis: LLE. Needs diuresis. - Start doxy 100 mg bid x 7 days. - Check CBC.   Followup in 3 weeks with APP (check volume and cellulitis) and 3 months with Dr. Aundra Dubin.   [place] FNP 01/10/2022

## 2022-01-10 NOTE — Patient Instructions (Signed)
Medication Changes:  Increase lasix to 40 mg Twice daily for 3 days, then start lasix 60 mg daily  Decrease Entresto to 24/26 Twice daily  Start Doxycykine 100 mg Twice daily for 5 days   Lab Work:  Labs done today, your results will be available in MyChart, we will contact you for abnormal readings.   Testing/Procedures:  none  Referrals:  none  Special Instructions // Education:  None   Follow-Up in: see my chart  At the [facility], you and your health needs are our priority. We have a designated team specialized in the treatment of Heart Failure. This Care Team includes your primary Heart Failure Specialized Cardiologist (physician), Advanced Practice Providers (APPs- Physician Assistants and Nurse Practitioners), and Pharmacist who all work together to provide you with the care you need, when you need it.   You may see any of the following providers on your designated Care Team at your next follow up:  Dr Glori Bickers Dr Haynes Kerns, NP Lyda Jester, [place] [facility] [place], [place] Audry Riles, PharmD   Please be sure to bring in all your medications bottles to every appointment.   Need to Contact us:  If you have any questions or concerns before your next appointment please send [place] a message through [place] or call our office at [phone].    TO LEAVE A MESSAGE FOR THE NURSE SELECT OPTION 2, PLEASE LEAVE A MESSAGE INCLUDING: YOUR NAME DATE OF BIRTH CALL BACK NUMBER REASON FOR CALL**this is important as we prioritize the call backs  YOU WILL RECEIVE A CALL BACK THE SAME DAY AS LONG AS YOU CALL BEFORE 4:00 PM

## 2022-01-10 NOTE — Progress Notes (Signed)
ReDS Vest / Clip - 01/10/22 1500       ReDS Vest / Clip   Station Marker D    Ruler Value 33    ReDS Value Range Moderate volume overload    ReDS Actual Value 38

## 2022-01-10 NOTE — Addendum Note (Signed)
Encounter addended by: Rafael Bihari, FNP on: 01/10/2022 4:44 PM  Actions taken: Clinical Note Signed

## 2022-01-13 ENCOUNTER — Other Ambulatory Visit: Payer: Self-pay

## 2022-01-13 ENCOUNTER — Ambulatory Visit (HOSPITAL_COMMUNITY)
Admission: RE | Admit: 2022-01-13 | Discharge: 2022-01-13 | Disposition: A | Discharge status: Home / Self Care | Payer: Medicare HMO | Source: Ambulatory Visit | Attending: Physician Assistant | Admitting: Physician Assistant

## 2022-01-13 ENCOUNTER — Ambulatory Visit (INDEPENDENT_AMBULATORY_CARE_PROVIDER_SITE_OTHER): Payer: Medicare HMO

## 2022-01-13 ENCOUNTER — Encounter (HOSPITAL_COMMUNITY): Payer: Self-pay | Admitting: Physician Assistant

## 2022-01-13 VITALS — BP 126/90 | HR 111 | Ht 71.0 in | Wt 271.8 lb

## 2022-01-13 DIAGNOSIS — I4892 Unspecified atrial flutter: Secondary | ICD-10-CM | POA: Insufficient documentation

## 2022-01-13 DIAGNOSIS — I484 Atypical atrial flutter: Secondary | ICD-10-CM

## 2022-01-13 DIAGNOSIS — R5383 Other fatigue: Secondary | ICD-10-CM | POA: Diagnosis not present

## 2022-01-13 DIAGNOSIS — D6869 Other thrombophilia: Secondary | ICD-10-CM | POA: Diagnosis not present

## 2022-01-13 DIAGNOSIS — Z9889 Other specified postprocedural states: Secondary | ICD-10-CM

## 2022-01-13 DIAGNOSIS — Z6837 Body mass index (BMI) 37.0-37.9, adult: Secondary | ICD-10-CM | POA: Diagnosis not present

## 2022-01-13 DIAGNOSIS — Z79899 Other long term (current) drug therapy: Secondary | ICD-10-CM | POA: Diagnosis not present

## 2022-01-13 DIAGNOSIS — G4733 Obstructive sleep apnea (adult) (pediatric): Secondary | ICD-10-CM | POA: Insufficient documentation

## 2022-01-13 DIAGNOSIS — L03116 Cellulitis of left lower limb: Secondary | ICD-10-CM | POA: Insufficient documentation

## 2022-01-13 DIAGNOSIS — R0602 Shortness of breath: Secondary | ICD-10-CM | POA: Diagnosis not present

## 2022-01-13 DIAGNOSIS — I5022 Chronic systolic (congestive) heart failure: Secondary | ICD-10-CM | POA: Insufficient documentation

## 2022-01-13 DIAGNOSIS — Z8679 Personal history of other diseases of the circulatory system: Secondary | ICD-10-CM | POA: Diagnosis not present

## 2022-01-13 DIAGNOSIS — Z951 Presence of aortocoronary bypass graft: Secondary | ICD-10-CM

## 2022-01-13 DIAGNOSIS — E669 Obesity, unspecified: Secondary | ICD-10-CM | POA: Diagnosis not present

## 2022-01-13 DIAGNOSIS — I480 Paroxysmal atrial fibrillation: Secondary | ICD-10-CM | POA: Diagnosis not present

## 2022-01-13 DIAGNOSIS — I2510 Atherosclerotic heart disease of native coronary artery without angina pectoris: Secondary | ICD-10-CM | POA: Insufficient documentation

## 2022-01-13 DIAGNOSIS — Z5181 Encounter for therapeutic drug level monitoring: Secondary | ICD-10-CM | POA: Diagnosis not present

## 2022-01-13 DIAGNOSIS — J449 Chronic obstructive pulmonary disease, unspecified: Secondary | ICD-10-CM | POA: Insufficient documentation

## 2022-01-13 DIAGNOSIS — I110 Hypertensive heart disease with heart failure: Secondary | ICD-10-CM | POA: Insufficient documentation

## 2022-01-13 DIAGNOSIS — I4819 Other persistent atrial fibrillation: Secondary | ICD-10-CM | POA: Insufficient documentation

## 2022-01-13 DIAGNOSIS — Z953 Presence of xenogenic heart valve: Secondary | ICD-10-CM

## 2022-01-13 LAB — POCT INR: INR: 2.6 (ref 2.0–3.0)

## 2022-01-13 NOTE — Progress Notes (Signed)
Primary Care Physician: Maurice Small, MD Primary Cardiologist: Dr Aundra Dubin Primary Electrophysiologist: Dr Lovena Le Referring Physician: Dr Berenice Bouton is a 76 y.o. male with a history of persistent atrial fibrillation/atrial flutter s/p MAZE 2018, CAD s/p CABG, HTN, s/p aortic and mitral valve replacements, COPD, OSA who presents for follow up in the [facility]. Patient reports that he feels "OK" but his wife reports that she has noticed more SOB and fatigue when he is active. Remote device transmission shows substantially increased AF burden since March 2020. Recently resumed his amiodarone. Amiodarone was stopped due to concern for amio lung toxicity. Pulmonology workup showed no clear evidence for ILRD or pulmonary fibrosis and amiodarone was resumed due to increasing afib burden.  On follow up today, patient is s/p afib ablation 12/16/21. He initially appeared to be maintaining SR but is back in atypical atrial flutter today. He does not feel any different today. Seen by [facility] on 1/30 and his diuresis was increased ans started on doxycycline for cellulitis on his left lower extremity.   Today, he denies symptoms of palpitations, chest pain, orthopnea, PND, lower extremity edema, dizziness, presyncope, syncope, bleeding, or neurologic sequela. The patient is tolerating medications without difficulties and is otherwise without complaint today.    Atrial Fibrillation Risk Factors:  he does have symptoms or diagnosis of sleep apnea. he is not compliant with CPAP therapy.  he has a BMI of Body mass index is 37.91 kg/m.[name]   Family History  Problem Relation Age of Onset   Stroke Father        No details   Angina Mother    Kidney cancer Brother      Atrial Fibrillation Management history:  Previous antiarrhythmic drugs: amiodarone Previous cardioversions: 04/2018, 06/18/21 Previous ablations: MAZE, 12/16/21 CHADS2VASC score: 6 Anticoagulation history:  warfarin   Past Medical History:  Diagnosis Date   Anemia 07/13/2017   Anxiety    Aortic insufficiency    Aortic stenosis, moderate 07/13/2017   Arthritis    back    Carotid stenosis    Right carotid stent (widely patent) 40 - 59% left plaque 11/13   CHF (congestive heart failure) (HCC)    Chronic kidney disease    COPD (chronic obstructive pulmonary disease) ([place])    Coronary artery disease involving native coronary artery of native heart without angina pectoris    Depression    Dyslipidemia    GERD (gastroesophageal reflux disease)    Heart murmur    Hemiplegia affecting unspecified side, late effect of cerebrovascular disease    resolved- from L side    Hypertension    Jaundice    resolved following ERCP & Cholecystectomy   Mild emphysema (HCC)    Mitral regurgitation    Mitral valve insufficiency and aortic valve insufficiency    Myocardial infarction ([place])    Paroxysmal atrial fibrillation ([place])    Pre-diabetes    per spouse   S/P aortic valve replacement with bioprosthetic valve 11/16/2017   23 mm Edwards Magna Ease stented bovine pericardial tissue valve   S/P CABG x 1 11/16/2017   SVG to PDA with [facility] via right thigh   S/P Maze operation for atrial fibrillation 11/16/2017   Left side lesion set using bipolar radiofrequency and cryothermy ablation with clipping of LA appendage   S/P mitral valve replacement with bioprosthetic valve 11/16/2017   27 mm [facility] Mitral stented bovine pericardial tissue valve   Sleep apnea  does not wear CPAP   Sleep concern    resulted in surgery- after + sleep test. Pt. doesn't have a problem any longer.    Stroke ([place]) 03/11/2003   stent placed on the 31, 3, 2004, L side    Wears glasses    Wears hearing aid in both ears    Wears partial dentures    Past Surgical History:  Procedure Laterality Date   AORTIC VALVE REPLACEMENT N/A 11/16/2017   Procedure: AORTIC VALVE REPLACEMENT (AVR) with 14mm Magna Ease;  Surgeon: Rexene Alberts, MD;  Location: [place];  Service: Open Heart Surgery;  Laterality: N/A;   ARTERIAL LINE INSERTION Right 11/16/2017   Procedure: ARTERIAL LINE INSERTION -RIGHT FEMORAL;  Surgeon: Rexene Alberts, MD;  Location: [place];  Service: Open Heart Surgery;  Laterality: Right;   ATRIAL FIBRILLATION ABLATION N/A 12/16/2021   Procedure: ATRIAL FIBRILLATION ABLATION;  Surgeon: Constance Haw, MD;  Location: [place] CV LAB;  Service: Cardiovascular;  Laterality: N/A;   BACK SURGERY     lumbar back   CARDIOVERSION N/A 04/11/2018   Procedure: CARDIOVERSION;  Surgeon: Larey Dresser, MD;  Location: [facility] ENDOSCOPY;  Service: Cardiovascular;  Laterality: N/A;   CARDIOVERSION N/A 06/18/2021   Procedure: CARDIOVERSION;  Surgeon: Buford Dresser, MD;  Location: [facility] ENDOSCOPY;  Service: Cardiovascular;  Laterality: N/A;   CHOLECYSTECTOMY     CORONARY ARTERY BYPASS GRAFT N/A 11/16/2017   Procedure: CORONARY ARTERY BYPASS GRAFTING (CABG) x 1;  Surgeon: Rexene Alberts, MD;  Location: [place];  Service: Open Heart Surgery;  Laterality: N/A;   ENDOVEIN HARVEST OF GREATER SAPHENOUS VEIN Right 11/16/2017   Procedure: ENDOVEIN HARVEST OF GREATER SAPHENOUS VEIN;  Surgeon: Rexene Alberts, MD;  Location: [place];  Service: Open Heart Surgery;  Laterality: Right;   ERCP N/A 05/31/2013   Procedure: ENDOSCOPIC RETROGRADE CHOLANGIOPANCREATOGRAPHY (ERCP);  Surgeon: Ladene Artist, MD;  Location: [facility]OS[facility];  Service: Endoscopy;  Laterality: N/A;   FOOT SURGERY     right   IR RADIOLOGY PERIPHERAL GUIDED IV START  09/28/2017   IR US GUIDE VASC ACCESS RIGHT  09/28/2017   LAPAROSCOPIC CHOLECYSTECTOMY SINGLE PORT N/A 06/01/2013   Procedure: LAPAROSCOPIC CHOLECYSTECTOMY SINGLE PORT;  Surgeon: Adin Hector, MD;  Location: [facility];  Service: General;  Laterality: N/A;   MAZE N/A 11/16/2017   Procedure: MAZE;  Surgeon: Rexene Alberts, MD;  Location: [place];  Service: Open Heart Surgery;  Laterality: N/A;   MITRAL  VALVE REPAIR N/A 11/16/2017   Procedure: MITRAL VALVE  REPLACEMENT with 84mm MagnaEase;  Surgeon: Rexene Alberts, MD;  Location: [place];  Service: Open Heart Surgery;  Laterality: N/A;   PACEMAKER IMPLANT N/A 11/21/2017   Procedure: PACEMAKER IMPLANT;  Surgeon: Evans Lance, MD;  Location: [place] CV LAB;  Service: Cardiovascular;  Laterality: N/A;   POLYPECTOMY     RIGHT/LEFT HEART CATH AND CORONARY ANGIOGRAPHY N/A 08/30/2017   Procedure: RIGHT/LEFT HEART CATH AND CORONARY ANGIOGRAPHY;  Surgeon: Larey Dresser, MD;  Location: [place] CV LAB;  Service: Cardiovascular;  Laterality: N/A;   SHOULDER ARTHROSCOPY WITH ROTATOR CUFF REPAIR AND SUBACROMIAL DECOMPRESSION Left 05/18/2017   SHOULDER ARTHROSCOPY WITH ROTATOR CUFF REPAIR AND SUBACROMIAL DECOMPRESSION Left 05/18/2017   Procedure: SHOULDER ARTHROSCOPY WITH ROTATOR CUFF REPAIR AND SUBACROMIAL DECOMPRESSION;  Surgeon: Tania Ade, MD;  Location: [place];  Service: Orthopedics;  Laterality: Left;  LEFT SHOULDER ARTHROSCOPY WITH ROTATOR CUFF REPAIR AND SUBACROMIAL DECOMPRESSION   TEE WITHOUT  CARDIOVERSION N/A 05/09/2017   Procedure: TRANSESOPHAGEAL ECHOCARDIOGRAM (TEE);  Surgeon: Skeet Latch, MD;  Location: [place];  Service: Cardiovascular;  Laterality: N/A;   TEE WITHOUT CARDIOVERSION N/A 08/30/2017   Procedure: TRANSESOPHAGEAL ECHOCARDIOGRAM (TEE);  Surgeon: Larey Dresser, MD;  Location: [facility] ENDOSCOPY;  Service: Cardiovascular;  Laterality: N/A;   TEE WITHOUT CARDIOVERSION N/A 11/16/2017   Procedure: TRANSESOPHAGEAL ECHOCARDIOGRAM (TEE);  Surgeon: Rexene Alberts, MD;  Location: [place];  Service: Open Heart Surgery;  Laterality: N/A;   TEE WITHOUT CARDIOVERSION N/A 04/11/2018   Procedure: TRANSESOPHAGEAL ECHOCARDIOGRAM (TEE);  Surgeon: Larey Dresser, MD;  Location: [facility] ENDOSCOPY;  Service: Cardiovascular;  Laterality: N/A;   TEE WITHOUT CARDIOVERSION N/A 06/18/2021   Procedure: TRANSESOPHAGEAL ECHOCARDIOGRAM (TEE);  Surgeon:  Buford Dresser, MD;  Location: [facility] ENDOSCOPY;  Service: Cardiovascular;  Laterality: N/A;   TONSILLECTOMY      Current Outpatient Medications  Medication Sig Dispense Refill   acetaminophen (TYLENOL) 500 MG tablet Take 1,000 mg by mouth every 6 (six) hours as needed (pain.).     amiodarone (PACERONE) 200 MG tablet Take 1 tablet (200 mg total) by mouth daily. 45 tablet 3   Azelastine HCl 137 MCG/SPRAY SOLN Use 1 spray in each nostril 2 times a day 90 mL 3   calcium carbonate (TUMS - DOSED IN MG ELEMENTAL CALCIUM) 500 MG chewable tablet Chew 1 tablet by mouth daily as needed for indigestion or heartburn.     carvedilol (COREG) 12.5 MG tablet Take 1 tablet (12.5 mg total) by mouth 2 (two) times daily with a meal. 180 tablet 3   cetirizine (ZYRTEC) 10 MG tablet Take 10 mg by mouth daily as needed for allergies.     citalopram (CELEXA) 20 MG tablet Take 1 tablet (20 mg total) by mouth at bedtime. 30 tablet 5   docusate sodium (COLACE) 100 MG capsule Take 1 capsule (100 mg total) by mouth 3 (three) times daily as needed. 20 capsule 0   doxycycline (VIBRAMYCIN) 50 MG capsule Take 2 capsules (100 mg total) by mouth 2 (two) times daily. 20 capsule 0   empagliflozin (JARDIANCE) 10 MG TABS tablet TAKE 1 TABLET BY MOUTH ONCE A DAY BEFORE BREAKFAST 90 tablet 3   fluticasone (FLONASE) 50 MCG/ACT nasal spray Place into both nostrils as needed for allergies or rhinitis.     furosemide (LASIX) 40 MG tablet Take 1.5 tablets (60 mg total) by mouth daily. 90 tablet 3   loratadine (CLARITIN) 10 MG tablet Take 10 mg by mouth daily as needed.     methocarbamol (ROBAXIN) 500 MG tablet Take 1 tablet (500 mg total) by mouth every 8 (eight) hours as needed for muscle spasms. 30 tablet 0   neomycin-bacitracin-polymyxin (NEOSPORIN) ointment Apply 1 application topically 2 (two) times daily as needed for wound care (on legs (wound)).     pantoprazole (PROTONIX) 40 MG tablet Take 1 tablet (40 mg total) by mouth daily.  90 tablet 3   Pseudoeph-Doxylamine-DM-APAP (NYQUIL PO) Take 30 mLs by mouth at bedtime as needed (cold symptoms).     rosuvastatin (CRESTOR) 10 MG tablet TAKE 1 TABLET EVERY DAY 90 tablet 0   sacubitril-valsartan (ENTRESTO) 24-26 MG Take 1 tablet by mouth 2 (two) times daily. 180 tablet 3   spironolactone (ALDACTONE) 25 MG tablet TAKE 1 TABLET EVERY DAY 90 tablet 3   traZODone (DESYREL) 50 MG tablet Take 2 tablets (100 mg total) by mouth at bedtime as needed. 180 tablet 3   warfarin (COUMADIN) 2.5 MG tablet  TAKE 1/2 TO 1 TABLET BY MOUTH ONCE DAILY AS DIRECTED BY COUMADIN CLINIC. DOSE CHANGE FROM 5MG  TO 2.5MG  TAB (Patient taking differently: Take 2.5 mg by mouth daily at 8 pm. TAKE 1/2 TO 1 TABLET BY MOUTH ONCE DAILY AS DIRECTED BY COUMADIN CLINIC. DOSE CHANGE FROM 5MG  TO 2.5MG  TAB) 90 tablet 1   No current facility-administered medications for this encounter.    Allergies  Allergen Reactions   Amlodipine Swelling   Bee Venom Anaphylaxis and Swelling    Social History   Socioeconomic History   Marital status: Married    Spouse name: Not on file   Number of children: 3   Years of education: Not on file   Highest education level: Not on file  Occupational History    Employer: RETIRED  Tobacco Use   Smoking status: Former    Packs/day: 0.50    Years: 57.00    Pack years: 28.50    Types: Cigarettes    Quit date: 2018    Years since quitting: 5.0   Smokeless tobacco: Never   Tobacco comments:    Former smoker 01/13/22  Vaping Use   Vaping Use: Former   Quit date: 07/13/2017  Substance and Sexual Activity   Alcohol use: Yes    Alcohol/week: 14.0 standard drinks    Types: 14 Standard drinks or equivalent per week    Comment: 2 mixed drinks daily 01/13/22   Drug use: No   Sexual activity: Yes  Other Topics Concern   Not on file  Social History Narrative   Two living children.  Lives with wife.     Social Determinants of Health   Financial Resource Strain: Not on file   Food Insecurity: Not on file  Transportation Needs: Not on file  Physical Activity: Not on file  Stress: Not on file  Social Connections: Not on file  Intimate Partner Violence: Not on file     ROS- All systems are reviewed and negative except as per the HPI above.  Physical Exam: Vitals:   01/13/22 1331  BP: 126/90  Pulse: (!) 111  Weight: 123.3 kg  Height: 5\' 11"  (1.803 m)    GEN- The patient is a well appearing obese male, alert and oriented x 3 today.   HEENT-head normocephalic, atraumatic, sclera clear, conjunctiva pink, hearing intact, trachea midline. Lungs- Clear to ausculation bilaterally, normal work of breathing Heart- Regular rate and rhythm, no murmurs, rubs or gallops  GI- soft, NT, ND, + BS Extremities- no clubbing, cyanosis, or edema MS- no significant deformity or atrophy Skin- no rash or lesion Psych- euthymic mood, full affect Neuro- strength and sensation are intact   Wt Readings from Last 3 Encounters:  01/13/22 123.3 kg  01/10/22 123.6 kg  12/30/21 124.7 kg    EKG today demonstrates  Atypical atrial flutter, LBBB Vent. rate 111 BPM PR interval * ms QRS duration 154 ms QT/QTcB 388/527 ms  Echo 10/29/20 demonstrated 1. Left ventricular ejection fraction, by estimation, is 50 to 55%. The  left ventricle has low normal function. The left ventricle demonstrates  global hypokinesis. There is mild left ventricular hypertrophy. Left  ventricular diastolic parameters are indeterminate.   2. Right ventricular systolic function is normal. The right ventricular  size is mildly enlarged. There is normal pulmonary artery systolic  pressure. The estimated right ventricular systolic pressure is 25.3 mmHg.   3. Left atrial size was mildly dilated.   4. Bioprosthetic mitral valve with no significant regurgitation and mean  gradient 5 mmHg. Valve is functioning normally.   5. Bioprosthetic aortic valve with mean gradient 12 mmHg and no  significant  regurgitation, the valve functions normally.   6. Aortic dilatation noted. There is mild dilatation of the ascending  aorta, measuring 39 mm.   7. The inferior vena cava is normal in size with greater than 50%  respiratory variability, suggesting right atrial pressure of 3 mmHg.   Epic records are reviewed at length today  Assessment and Plan:  1. Persistent atrial fibrillation/atrial flutter S/p afib ablation 12/16/21 He is in atrial flutter today. ? Related to recent infection.  Continue amiodarone 200 mg daily for now. Plan to discontinue if he maintains SR after ablation.  Will have him send a remote device transmission to evaluate arrhythmia burden. If persistent, will arrange for TEE/DCCV with Dr Aundra Dubin as he is on warfarin and tolerates afib poorly if out of rhythm for weeks.  Continue warfarin  Continue Coreg 12.5 mg BID  This patients CHA2DS2-VASc Score and unadjusted Ischemic Stroke Rate (% per year) is equal to 9.7 % stroke rate/year from a score of 6  Above score calculated as 1 point each if present [CHF, HTN, DM, Vascular=MI/PAD/Aortic Plaque, Age if 65-74, or Male] Above score calculated as 2 points each if present [Age > 75, or Stroke/TIA/TE]  2. Obesity Body mass index is 37.91 kg/m. Lifestyle modification was discussed and encouraged including regular physical activity and weight reduction.  3. HTN Stable, no changes today.  4. PPM Followed by Dr Lovena Le and the device clinic.  5. Chronic systolic CHF Followed in the [facility]. No signs or symptoms of fluid overload today.  6. CAD No anginal symptoms.  7. OSA Not on CPAP.   Follow up in the [facility] and with Dr Curt Bears as scheduled.    [facility] [address] [place], [place] [address] [phone] 01/13/2022 1:39 PM

## 2022-01-13 NOTE — Patient Instructions (Signed)
Cardioversion scheduled for Friday, February 10th  - have INR check prior to arrival  - Arrive at the [facility] and go to admitting at [place] not eat or drink anything after midnight the night prior to your procedure.  - Take all your morning medication (except diabetic medications) with a sip of water prior to arrival.  - You will not be able to drive home after your procedure.  - Do NOT miss any doses of your blood thinner - if you should miss a dose please notify our office immediately.  - If you feel as if you go back into normal rhythm prior to scheduled cardioversion, please notify our office immediately. If your procedure is canceled in the cardioversion suite you will be charged a cancellation fee.

## 2022-01-13 NOTE — Patient Instructions (Signed)
Description   Continue taking Warfarin 1 tablet (2.5mg ) daily.Recheck INR in 1 week prior to cardioversion on 01/21/22. Coumadin Clinic [phone].

## 2022-01-18 ENCOUNTER — Other Ambulatory Visit: Payer: Self-pay

## 2022-01-18 ENCOUNTER — Encounter (HOSPITAL_COMMUNITY): Payer: Self-pay | Admitting: Cardiology

## 2022-01-18 ENCOUNTER — Ambulatory Visit (HOSPITAL_COMMUNITY)
Admission: RE | Admit: 2022-01-18 | Discharge: 2022-01-18 | Disposition: A | Discharge status: Home / Self Care | Payer: Medicare HMO | Source: Ambulatory Visit | Attending: Cardiology | Admitting: Cardiology

## 2022-01-18 DIAGNOSIS — I5022 Chronic systolic (congestive) heart failure: Secondary | ICD-10-CM | POA: Insufficient documentation

## 2022-01-18 LAB — CBC
HCT: 40.7 % (ref 39.0–52.0)
Hemoglobin: 13.3 g/dL (ref 13.0–17.0)
MCH: 33.5 pg (ref 26.0–34.0)
MCHC: 32.7 g/dL (ref 30.0–36.0)
MCV: 102.5 fL — ABNORMAL HIGH (ref 80.0–100.0)
Platelets: 183 10*3/uL (ref 150–400)
RBC: 3.97 MIL/uL — ABNORMAL LOW (ref 4.22–5.81)
RDW: 14.6 % (ref 11.5–15.5)
WBC: 6.2 10*3/uL (ref 4.0–10.5)
nRBC: 0 % (ref 0.0–0.2)

## 2022-01-18 LAB — BASIC METABOLIC PANEL
Anion gap: 14 (ref 5–15)
BUN: 51 mg/dL — ABNORMAL HIGH (ref 8–23)
CO2: 21 mmol/L — ABNORMAL LOW (ref 22–32)
Calcium: 9.1 mg/dL (ref 8.9–10.3)
Chloride: 103 mmol/L (ref 98–111)
Creatinine, Ser: 2.54 mg/dL — ABNORMAL HIGH (ref 0.61–1.24)
GFR, Estimated: 26 mL/min — ABNORMAL LOW (ref >60)
Glucose, Bld: 114 mg/dL — ABNORMAL HIGH (ref 70–99)
Potassium: 4.8 mmol/L (ref 3.5–5.1)
Sodium: 138 mmol/L (ref 135–145)

## 2022-01-20 ENCOUNTER — Telehealth (HOSPITAL_COMMUNITY): Payer: Self-pay | Admitting: *Deleted

## 2022-01-20 NOTE — Telephone Encounter (Signed)
Manual PPM transmission received.  Patient is in NSR- AP/VS 60 bpm.  Please advised patient regarding scheduled DCCV on 01/21/22.

## 2022-01-20 NOTE — Telephone Encounter (Signed)
Per transmission. Patient back in NSR. Cardioversion canceled. Instructed to keep scheduled follow up in place.

## 2022-01-21 ENCOUNTER — Ambulatory Visit (HOSPITAL_COMMUNITY): Admission: RE | Admit: 2022-01-21 | Payer: Medicare HMO | Source: Home / Self Care | Admitting: Cardiology

## 2022-01-21 ENCOUNTER — Encounter (HOSPITAL_COMMUNITY): Admission: RE | Payer: Self-pay | Source: Home / Self Care

## 2022-01-21 SURGERY — ECHOCARDIOGRAM, TRANSESOPHAGEAL
Anesthesia: Monitor Anesthesia Care

## 2022-01-25 NOTE — Progress Notes (Signed)
Advanced Heart Failure Clinic Note   PCP: Dr. Justin Mend Cardiology: Dr. Radford Pax HF Cardiology: Dr. Aundra Dubin  76 y.o.with history of CVA, paroxysmal atrial fibrillation, valvular heart disease, and chronic diastolic CHF presents for evaluation of CHF and valvular heart disease.    He had had episodes of dyspnea and initial workup led to a TEE in 5/18 showing normal EF with moderate AS and moderate MR.  He continued to have episodic dyspnea and ended up admitted in 8/18 with shortness of breath and chest pressure.  This led to a long, complicated hospitalization.  He was noted to be volume overloaded with acute diastolic CHF and was also noted to have symptomatic runs of atrial fibrillation with RVR.  Amiodarone was started to control the atrial fibrillation.  He developed respiratory distress => Bipap => intubated.  He became hypotensive, requiring pressors.  He developed AKI.  PNA was noted and he received broad spectrum abx.  He had a prolonged intubation but was eventually extubated.  Given deconditioning from long hospitalization, he went to CIR for a couple of weeks.  LHC in 9/18 showed occluded PDA with left>right collaterals.  TEE in 9/18 showed EF 50%, severe MR (possibly infarct-related with restricted posterior leaflet, and moderate AS + moderate-severe AI (possibly rheumatic).   In 12/18, he had cardiac surgery with bioprosthetic aortic and mitral valves placed.  He had SVG-PDA, Maze, and LA appendage clipping.  Post-op course was complicated by CHB requiring Medtronic dual chamber PPM (His bundle lead placed but captured right bundle so induced LBBB).   Echo 01/02/18 LVEF 30-35%, bioprosthetic MV and AoV function normally, RV mildly dilated with normal systolic function.    He was noted to be in atrial flutter with mild RVR and had TEE-guided DCCV on 04/11/18. TEE showed EF 25-30%, normal bioprosthetic aortic and mitral valves.  Echo in 6/19 showed EF 30-35%, normal bioprosthetic aortic and mitral  valves.  Echo 12/19 with EF up to 50-55%, normal bioprosthetic mitral and aortic valves, normal RV.   In 2020, he went back into atrial fibrillation.  Amiodarone was started, and he returned to NSR.   Echo in 11/21 showed EF 50-55%, mild LVH, mildly dilated RV with normal function, bioprosthetic mitral valve with mean gradient 5 mmHg and no MR, bioprosthetic aortic valve with mean gradient 12 and no AI.  PFTs in 1/22 were restrictive.  High resolution CT chest was done in 2/22, concerning for ILD, usual interstitial pneumonitis.  He saw pulmonary, ESR was mildly elevated.  We decided to try him off amiodarone given concern for possible amiodarone-induced lung toxicity.  He then developed atrial fibrillation off amiodarone, was quite symptomatic.  He underwent TEE-guided DCCV back to NSR in 7/22 and amiodarone was restarted.  TEE showed EF 50-55%, mid septal HK, RV normal, bioprosthetic aortic and mitral valves appeared normal.   He has had runs of atrial fibrillation, and amiodarone has been increased back to 200 mg daily.   S/p AF ablation 12/16/21.  Follow up 1/23 back in AF, volume up with LLE cellulitis. BP low and Entresto reduced to allow more BP room for diuresis. Lasix increased and doxy added.   Today he returns for HF follow up. Breathing has improved since last visit, he is able to complete ADLs and walking short distances without dyspnea. He has some LE edema and he has been scratching his LLE. Denies abnormal bleeding, palpitations, CP, dizziness, or PND/Orthopnea. Appetite ok. No fever or chills. Weight at home 265 pounds. Taking  all medications.   Device interrogation (personally reviewed): No recent AT/AF, 1.2 hour/day activity.  Labs (9/18): K 4.5, creatinine 1.42, LDL 90, HDL 36, LFTs normal, TSH normal, hgb 9.6 Labs (10/18): K 4.3, creatinine 1.45 Labs (12/18): K 3.8, creatinine 1.56 Labs (1/19): creatinine 1.68, LDL 66 Labs (3/19): K 4.2, creatinine 1.56 Labs (4/19): K 4.4,  creatinine 1.71, hgb 11.8, LDL 75, HDL 43 Labs (6/19): K 4.8, creatinine 2.1, LDL 75, TGs 246 Labs (8/19): K 4.6, creatinine 2.07 Labs (11/19): K 4.6, creatinine 1.29 Labs (2/20): K 4.8, creatinine 1.98 Labs (5/20): LDL 49, HDL 39 Labs (7/20): TSH normal Labs (9/20): TSH normal, K 4.4, creatinine 2.03, hgb 13.5, LFTs normal Labs (12/20): K 4.4, creatinine 1.87, hgb 13.1.  Labs (11/21): LDL 54, K 4.6, creatinine 2.22, LFTs normal, TSH normal Labs (7/22): TSH normal, K 4.5, creatinine 2.98, hgb 13.7 Labs (8/22): K 4.4, creatinine 2.23 Labs (10/22): LDL 44, HDL 64, LFTs and TSH normal Labs (12/22): K4.1, creatinine 2.12 Labs (2/23): K 4.8, creatinine 2.54  ECG (personally reviewed): SR with 1st degree AVB PR 376 msec  ReDs: 44%  PMH: 1. CVA: Right MCA, 2004.  Minimal residual problems.  2. HTN - Peripheral edema with amlodipine.  3. Hyperlipidemia 4. Left rotator cuff surgery 6/18 5. Carotid stenosis: s/p right carotid stent.  6. GERD 7. H/o CCY 8. Anemia: FOBT negative.  9. Gout  10. Atrial fibrillation: Paroxysmal. Maze in 12/18 with LAA clipping.  - Recurrent atrial fibrillation 2020 => amiodarone.  - Recurrent atrial fibrillation off amiodarone in 7/22, DCCV to NSR and amiodarone restarted.  - AF ablation 12/16/20. Amiodarone continued. 11. Valvular heart disease: TEE (5/18) with EF 55-60%, moderate AS mean 33 mmHg, moderate MR, normal RV size and systolic function.  - Echo (8/18): EF 55-60%, moderate LVH, moderate AS with mean gradient 33 mmHg, moderate AI, moderate to severe MR.  - TEE (9/18): EF 50%, mild LV dilation, suspect severe MR with posterior leaflet restricted (?infarct-related MR), ERO 0.4 cm^2, moderate AS/moderate-severe AI (possibly rheumatic).   - In 12/18, he had cardiac surgery with bioprosthetic aortic and mitral valves placed.  He had SVG-PDA, Maze, and LA appendage clipping. - Echo (1/19): LVEF 30-35%, bioprosthetic MV and AoV function normally, RV mildly  dilated with normal systolic function.  - TEE (5/19) with EF 25-30%, normal bioprosthetic aortic valve, normal bioprosthetic mitral valve. - Echo (6/19): EF 30-35%, mild LV dilation with diffuse hypokinesis, normal RV size with mildly decreased systolic function, normal-appearing bioprosthetic mitral and aortic valves.   - Echo (12/19): EF 50-55%, moderate LVH, bioprosthetic aortic valve with mean gradient 10 mmHg, normally-functioning bioprosthetic mitral valve.  - Echo (11/21): EF 50-55%, mild LVH, mildly dilated RV with normal function, bioprosthetic mitral valve with mean gradient 5 mmHg and no MR, bioprosthetic aortic valve with mean gradient 12 and no AI.   - TEE (7/22): EF 50-55%, septal HK, normal RV size/systolic function, LA appendage clipped, bioprosthetic aortic and mitral valves appeared normal.  12. Chronic systolic CHF 13. Deafness 14. CKD: stage 3.  15. 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  17. COPD: Mild obstruction on 9/18 PFTs.  - PFTs (10/20): Mild obstruction, decreased DLCO.  18. Post-op CHB in 12/18 with Medtronic dual chamber PPM.  19. Chronic systolic CHF: Echo (1/44) with EF down to 30-35% post-op.  - TEE (5/19) with  EF 25-30%, normal bioprosthetic aortic valve, normal bioprosthetic mitral valve.  - Echo (12/19): EF 50-55%, moderate LVH, bioprosthetic aortic valve with mean gradient 10 mmHg, normally-functioning bioprosthetic mitral valve.  20. Atrial flutter: DCCV in 5/19.  21. OSA: Not using CPAP.  22. Interstitial lung disease: PFTs in 1/22 concerning for restriction.  High resolution chest CT in 2/22 with possible UIP.  - Chest CT (1/23): w/ groundglass changes, felt nonspecific and no clear evidence of ILD or pulmonary fibrosis.  SH: Quit smoking 8/18.  Married, 2 children, retired.    Family History  Problem Relation  Age of Onset   Stroke Father        No details   Angina Mother    Kidney cancer Brother    Review of systems complete and found to be negative unless listed in HPI.    Current Outpatient Medications  Medication Sig Dispense Refill   acetaminophen (TYLENOL) 500 MG tablet Take 1,000 mg by mouth every 6 (six) hours as needed (pain.).     amiodarone (PACERONE) 200 MG tablet Take 1 tablet (200 mg total) by mouth daily. 45 tablet 3   Azelastine HCl 137 MCG/SPRAY SOLN Use 1 spray in each nostril 2 times a day 90 mL 3   calcium carbonate (TUMS - DOSED IN MG ELEMENTAL CALCIUM) 500 MG chewable tablet Chew 1 tablet by mouth daily as needed for indigestion or heartburn.     carvedilol (COREG) 12.5 MG tablet Take 1 tablet (12.5 mg total) by mouth 2 (two) times daily with a meal. 180 tablet 3   cetirizine (ZYRTEC) 10 MG tablet Take 10 mg by mouth daily as needed for allergies.     citalopram (CELEXA) 20 MG tablet Take 1 tablet (20 mg total) by mouth at bedtime. 30 tablet 5   docusate sodium (COLACE) 100 MG capsule Take 1 capsule (100 mg total) by mouth 3 (three) times daily as needed. 20 capsule 0   empagliflozin (JARDIANCE) 10 MG TABS tablet TAKE 1 TABLET BY MOUTH ONCE A DAY BEFORE BREAKFAST 90 tablet 3   fluticasone (FLONASE) 50 MCG/ACT nasal spray Place into both nostrils daily as needed for allergies or rhinitis.     furosemide (LASIX) 40 MG tablet Take 40 mg by mouth.     loratadine (CLARITIN) 10 MG tablet Take 10 mg by mouth daily as needed for allergies or rhinitis.     methocarbamol (ROBAXIN) 500 MG tablet Take 1 tablet (500 mg total) by mouth every 8 (eight) hours as needed for muscle spasms. 30 tablet 0   pantoprazole (PROTONIX) 40 MG tablet Take 1 tablet (40 mg total) by mouth daily. 90 tablet 3   Pseudoeph-Doxylamine-DM-APAP (NYQUIL PO) Take 30 mLs by mouth daily as needed (Cold Symptons).     rosuvastatin (CRESTOR) 10 MG tablet TAKE 1 TABLET EVERY DAY 90 tablet 0   spironolactone (ALDACTONE)  25 MG tablet TAKE 1 TABLET EVERY DAY 90 tablet 3   traZODone (DESYREL) 50 MG tablet Take 2 tablets (100 mg total) by mouth at bedtime as needed. 180 tablet 3   warfarin (COUMADIN) 2.5 MG tablet daily. Patient takes 2.5 mg tablet by mouth daily as directed by the coumadin clinic.     No current facility-administered medications for this encounter.   BP 134/78    Pulse (!) 112    Wt 121.5 kg (267 lb 12.5 oz)    SpO2 95%    BMI 37.35 kg/m   Wt Readings from Last 3 Encounters:  01/26/22  121.5 kg (267 lb 12.5 oz)  01/13/22 123.3 kg (271 lb 12.8 oz)  01/10/22 123.6 kg (272 lb 6.4 oz)   General:  NAD. No resp difficulty HEENT: +HOH Neck: Supple. No JVD. Carotids 2+ bilat; no bruits. No lymphadenopathy or thryomegaly appreciated. Cor: PMI nondisplaced. Regular rate & rhythm. No rubs, gallops or murmurs. Lungs: Clear Abdomen: Obese, nontender, nondistended. No hepatosplenomegaly. No bruits or masses. Good bowel sounds. Extremities: No cyanosis, clubbing, rash, 1+ BLE edema; excoriation LLE Neuro: Alert & oriented x 3, cranial nerves grossly intact. Moves all 4 extremities w/o difficulty. Affect pleasant.  Assessment/Plan: 1. Valvular heart disease: TEE in 9/18 showed severe MR, probably infarct-related with restrictive posterior mitral leaflet. There was moderate aortic stenosis and moderate-severe aortic insufficiency, possible rheumatic.  In 12/18, he had bioprosthetic MVR and AVR. Valves stable on TEE in 7/22. - Antibiotic prophylaxis needed with dental work.  2. Chronic systolic => diastolic CHF: In setting of significant valvular disease as above. EF down to 30-35% post-op (1/19 echo), likely reflects true LV systolic function without the volume load from severe MR and moderate-severe AI.  TEE in 5/19 showed EF 25-30%.  Repeat echo in 6/19 with EF 30-35%. Repeat echo in 12/19 showed EF up to 50-55%, echo in 11/21 again showed EF 50-55% with normal RV systolic function.  TEE in 7/22 showed EF  50-55% with normal RV.  Improved NYHA II symptoms, though functional status difficult due to body habitus and general physical deconditioning. He does not appear volume overloaded on exam, weight down 5 lbs. GDMT by CKD stage 3.  ReDs 44%, ? Accuracy.  - Continue Lasix 40 mg daily. BMET/BNP today.  - Continue Entresto 24/26 bid.  - Continue Coreg 12.5 mg bid.   - Continue spironolactone 25 mg daily. - Continue Jardiance 10 mg daily.  3. CKD: Stage 3. BMET today. Baseline SCr 1.9-2.3. - As above, continue Jardiance.  4. Atrial fibrillation/flutter: Paroxysmal. He has had a Maze procedure.  He tends to tolerate atrial arrhythmias poorly.  He had atrial flutter with DCCV in 5/19.  Recurrent atrial fibrillation in 2020.  Stopped amiodarone and went into atrial fibrillation in 6/22, very symptomatic.  Amiodarone restarted and he was cardioverted to NSR in 7/22.  Amiodarone was subsequently increased to 200 mg daily with transient AF runs. S/p AF ablation 12/16/21. - He is on warfarin.  - Continue amiodarone 200 mg daily for now.  LFTs, TSH ok 10/22.  He will need an eye exam. CT chest in past showed possible early ILD, ESR was only mildly elevated.  PFTs (11/22) showed early interstitial process. He saw pulmonary, not definite amiodarone-induced lung toxicity but need to follow closely.  - Now s/p atrial fibrillation ablation, hopefully this will allow [place] to get him off amiodarone. He has follow up with EP 03/22/22. 5. COPD: Mild obstruction on 10/20 PFTs, decreased DLCO.  He has quit smoking.   - PFTs 11/22 showed early interstitial process.  - Follows with pulmonary.  6. CVA: Remote, minimal residual. S/p carotid stenting.  - No ASA given warfarin use.  7. Hyperlipidemia: Continue Crestor.      8. Anemia: Fe deficiency.   9. 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  The artery to the PDA was  occluded with left to right collaterals.  He had SVG-PDA in 12/18. No chest pain.  -  Continue statin, lipids ok 10/22.    - No ASA given warfarin use.  10. CHB: has Medtronic PPM.  He a-paces a high percentage of the time but there is minimal RV pacing.  11. OSA: Severe OSA, he does not want to use CPAP.  12. Obesity: We discussed diet/exercise for weight loss. Body mass index is 37.35 kg/m. - Consider referral to pharmacy for semaglutide. 13. HTN: BP ok today. 14. Interstitial lung disease: Restrictive PFTs in 1/22.  High resolution CT chest concerning for ILD, possibly usual interstitial pneumonitis.  He saw pulmonary, could not rule out amiodarone lung toxicity so tried him off amiodarone.  He quickly went back into atrial fibrillation that was tolerated poorly, so amiodarone restarted.  Will continue for now.  - PFTs (11/22) showed early interstitial process. - Continue to followup with pulmonary.  - Hopefully we can stop amiodarone safely in the future.  15. Cellulitis: LLE. Looks better today, no drainage. Needs to stop scratching area.  Followup in 3 months with Dr. Aundra Dubin as scheduled.   [place] FNP 01/26/2022

## 2022-01-26 ENCOUNTER — Other Ambulatory Visit: Payer: Self-pay

## 2022-01-26 ENCOUNTER — Encounter (HOSPITAL_COMMUNITY): Payer: Self-pay

## 2022-01-26 ENCOUNTER — Encounter (HOSPITAL_COMMUNITY): Payer: Self-pay | Admitting: Cardiology

## 2022-01-26 ENCOUNTER — Ambulatory Visit (HOSPITAL_COMMUNITY)
Admission: RE | Admit: 2022-01-26 | Discharge: 2022-01-26 | Disposition: A | Discharge status: Home / Self Care | Payer: Medicare HMO | Source: Ambulatory Visit | Attending: Cardiology | Admitting: Cardiology

## 2022-01-26 VITALS — BP 134/78 | HR 112 | Wt 267.8 lb

## 2022-01-26 DIAGNOSIS — I2510 Atherosclerotic heart disease of native coronary artery without angina pectoris: Secondary | ICD-10-CM | POA: Insufficient documentation

## 2022-01-26 DIAGNOSIS — I442 Atrioventricular block, complete: Secondary | ICD-10-CM

## 2022-01-26 DIAGNOSIS — I484 Atypical atrial flutter: Secondary | ICD-10-CM | POA: Diagnosis not present

## 2022-01-26 DIAGNOSIS — G4733 Obstructive sleep apnea (adult) (pediatric): Secondary | ICD-10-CM | POA: Diagnosis not present

## 2022-01-26 DIAGNOSIS — I080 Rheumatic disorders of both mitral and aortic valves: Secondary | ICD-10-CM | POA: Insufficient documentation

## 2022-01-26 DIAGNOSIS — Z7901 Long term (current) use of anticoagulants: Secondary | ICD-10-CM | POA: Insufficient documentation

## 2022-01-26 DIAGNOSIS — Z7984 Long term (current) use of oral hypoglycemic drugs: Secondary | ICD-10-CM | POA: Insufficient documentation

## 2022-01-26 DIAGNOSIS — Z950 Presence of cardiac pacemaker: Secondary | ICD-10-CM

## 2022-01-26 DIAGNOSIS — J849 Interstitial pulmonary disease, unspecified: Secondary | ICD-10-CM

## 2022-01-26 DIAGNOSIS — Z7182 Exercise counseling: Secondary | ICD-10-CM | POA: Insufficient documentation

## 2022-01-26 DIAGNOSIS — Z953 Presence of xenogenic heart valve: Secondary | ICD-10-CM | POA: Diagnosis not present

## 2022-01-26 DIAGNOSIS — Z8701 Personal history of pneumonia (recurrent): Secondary | ICD-10-CM | POA: Insufficient documentation

## 2022-01-26 DIAGNOSIS — I130 Hypertensive heart and chronic kidney disease with heart failure and stage 1 through stage 4 chronic kidney disease, or unspecified chronic kidney disease: Secondary | ICD-10-CM | POA: Insufficient documentation

## 2022-01-26 DIAGNOSIS — Z79899 Other long term (current) drug therapy: Secondary | ICD-10-CM | POA: Diagnosis not present

## 2022-01-26 DIAGNOSIS — I480 Paroxysmal atrial fibrillation: Secondary | ICD-10-CM | POA: Diagnosis not present

## 2022-01-26 DIAGNOSIS — Z6837 Body mass index (BMI) 37.0-37.9, adult: Secondary | ICD-10-CM | POA: Diagnosis not present

## 2022-01-26 DIAGNOSIS — I5042 Chronic combined systolic (congestive) and diastolic (congestive) heart failure: Secondary | ICD-10-CM | POA: Insufficient documentation

## 2022-01-26 DIAGNOSIS — Z87891 Personal history of nicotine dependence: Secondary | ICD-10-CM | POA: Diagnosis not present

## 2022-01-26 DIAGNOSIS — I10 Essential (primary) hypertension: Secondary | ICD-10-CM

## 2022-01-26 DIAGNOSIS — L03116 Cellulitis of left lower limb: Secondary | ICD-10-CM | POA: Diagnosis not present

## 2022-01-26 DIAGNOSIS — E785 Hyperlipidemia, unspecified: Secondary | ICD-10-CM | POA: Insufficient documentation

## 2022-01-26 DIAGNOSIS — Z713 Dietary counseling and surveillance: Secondary | ICD-10-CM | POA: Insufficient documentation

## 2022-01-26 DIAGNOSIS — D509 Iron deficiency anemia, unspecified: Secondary | ICD-10-CM | POA: Insufficient documentation

## 2022-01-26 DIAGNOSIS — E669 Obesity, unspecified: Secondary | ICD-10-CM | POA: Insufficient documentation

## 2022-01-26 DIAGNOSIS — N183 Chronic kidney disease, stage 3 unspecified: Secondary | ICD-10-CM | POA: Insufficient documentation

## 2022-01-26 DIAGNOSIS — I5022 Chronic systolic (congestive) heart failure: Secondary | ICD-10-CM

## 2022-01-26 DIAGNOSIS — I4892 Unspecified atrial flutter: Secondary | ICD-10-CM | POA: Diagnosis not present

## 2022-01-26 DIAGNOSIS — J449 Chronic obstructive pulmonary disease, unspecified: Secondary | ICD-10-CM | POA: Diagnosis not present

## 2022-01-26 DIAGNOSIS — Z8673 Personal history of transient ischemic attack (TIA), and cerebral infarction without residual deficits: Secondary | ICD-10-CM | POA: Diagnosis not present

## 2022-01-26 DIAGNOSIS — J441 Chronic obstructive pulmonary disease with (acute) exacerbation: Secondary | ICD-10-CM

## 2022-01-26 DIAGNOSIS — I447 Left bundle-branch block, unspecified: Secondary | ICD-10-CM | POA: Insufficient documentation

## 2022-01-26 LAB — BASIC METABOLIC PANEL
Anion gap: 10 (ref 5–15)
BUN: 46 mg/dL — ABNORMAL HIGH (ref 8–23)
CO2: 25 mmol/L (ref 22–32)
Calcium: 9.1 mg/dL (ref 8.9–10.3)
Chloride: 102 mmol/L (ref 98–111)
Creatinine, Ser: 2.56 mg/dL — ABNORMAL HIGH (ref 0.61–1.24)
GFR, Estimated: 25 mL/min — ABNORMAL LOW (ref >60)
Glucose, Bld: 116 mg/dL — ABNORMAL HIGH (ref 70–99)
Potassium: 5.1 mmol/L (ref 3.5–5.1)
Sodium: 137 mmol/L (ref 135–145)

## 2022-01-26 LAB — BRAIN NATRIURETIC PEPTIDE: B Natriuretic Peptide: 299.2 pg/mL — ABNORMAL HIGH (ref 0.0–100.0)

## 2022-01-26 NOTE — Progress Notes (Signed)
ReDS Vest / Clip - 01/26/22 1500       ReDS Vest / Clip   Station Marker D    Ruler Value 32    ReDS Value Range High volume overload    ReDS Actual Value 44

## 2022-01-26 NOTE — Patient Instructions (Signed)
Thank you for coming in today  Labs were done today, if any labs are abnormal the clinic will call you  Your physician recommends that you schedule a follow-up appointment in: please keep follow up appointment with Dr. Aundra Dubin  At the [facility], you and your health needs are our priority. As part of our continuing mission to provide you with exceptional heart care, we have created designated Provider Care Teams. These Care Teams include your primary Cardiologist (physician) and Advanced Practice Providers (APPs- Physician Assistants and Nurse Practitioners) who all work together to provide you with the care you need, when you need it.   You may see any of the following providers on your designated Care Team at your next follow up: Dr Glori Bickers Dr Haynes Kerns, NP Lyda Jester, [facility] [place], [place] Audry Riles, PharmD   Please be sure to bring in all your medications bottles to every appointment.   If you have any questions or concerns before your next appointment please send [place] a message through [place] or call our office at [phone].    TO LEAVE A MESSAGE FOR THE NURSE SELECT OPTION 2, PLEASE LEAVE A MESSAGE INCLUDING: YOUR NAME DATE OF BIRTH CALL BACK NUMBER REASON FOR CALL**this is important as we prioritize the call backs  YOU WILL RECEIVE A CALL BACK THE SAME DAY AS LONG AS YOU CALL BEFORE 4:00 PM

## 2022-01-27 ENCOUNTER — Telehealth (HOSPITAL_COMMUNITY): Payer: Self-pay | Admitting: Cardiology

## 2022-01-27 ENCOUNTER — Other Ambulatory Visit (HOSPITAL_COMMUNITY): Payer: Self-pay

## 2022-01-27 DIAGNOSIS — I5022 Chronic systolic (congestive) heart failure: Secondary | ICD-10-CM

## 2022-01-27 MED ORDER — FUROSEMIDE 40 MG PO TABS: 40.0000 mg | ORAL_TABLET | Freq: Two times a day (BID) | ORAL | 3 refills | Status: DC

## 2022-01-27 MED ORDER — ENTRESTO 24-26 MG PO TABS: 1.0000 | ORAL_TABLET | Freq: Two times a day (BID) | ORAL | 3 refills | Status: DC

## 2022-01-27 NOTE — Telephone Encounter (Signed)
Patient called.  Patient aware. Will have labs repeated at [facility] coumadin clinic

## 2022-01-27 NOTE — Telephone Encounter (Signed)
-----   Message from Rafael Bihari, [place] sent at 01/26/2022  5:03 PM EST ----- BNP remains elevated, increase Lasix back to 40 bid. Will watch kidney function closely.  Repeat BMET in 2 weeks.

## 2022-01-28 ENCOUNTER — Encounter: Payer: Self-pay | Admitting: Cardiology

## 2022-01-28 ENCOUNTER — Telehealth: Payer: Self-pay

## 2022-01-28 ENCOUNTER — Telehealth (HOSPITAL_COMMUNITY): Payer: Self-pay | Admitting: Family Medicine

## 2022-01-28 NOTE — Telephone Encounter (Signed)
-----   Message from Benedetto Goad, RN sent at 01/28/2022  2:47 PM EST ----- Hey can you do this for [facility]?     ----- Message ----- From: Rafael Bihari, FNP Sent: 01/28/2022   8:27 AM EST To: Rosalene Billings, RN  Leonie Man, Can you call Mr Flight and have him send over a transmission? I want to look at his AF burden.  Thanks! -J

## 2022-01-28 NOTE — Telephone Encounter (Signed)
I spoke with the patient wife Katharine Look and she agreed to send a manual transmission. She would like a call back about the transmission.

## 2022-01-28 NOTE — Telephone Encounter (Signed)
Called and spoke to Doland, patient's wife, letting her know Rand's device report was reviewed today and showed no AF. She was appreciative of the call.  Allena Katz, FNP-BC 01/28/22

## 2022-01-28 NOTE — Telephone Encounter (Signed)
Transmission received.

## 2022-01-28 NOTE — Telephone Encounter (Signed)
Transmission received and routed to [facility], [place] 01/28/22   Transmission 01/26/22

## 2022-02-06 ENCOUNTER — Encounter (HOSPITAL_COMMUNITY): Payer: Self-pay | Admitting: Cardiology

## 2022-02-07 ENCOUNTER — Encounter (HOSPITAL_COMMUNITY): Payer: Self-pay | Admitting: Cardiology

## 2022-02-07 ENCOUNTER — Telehealth (HOSPITAL_COMMUNITY): Payer: Self-pay | Admitting: Pharmacy Technician

## 2022-02-07 ENCOUNTER — Other Ambulatory Visit (HOSPITAL_COMMUNITY): Payer: Self-pay | Admitting: *Deleted

## 2022-02-07 MED ORDER — ENTRESTO 24-26 MG PO TABS
1.0000 | ORAL_TABLET | Freq: Two times a day (BID) | ORAL | 3 refills | Status: DC
Start: 2022-02-07 — End: 2022-02-17

## 2022-02-07 NOTE — Telephone Encounter (Signed)
Advanced Heart Failure Patient Advocate Encounter  Patient's wife sent mychart message requesting help with getting refill of Entresto. Looks like it was sent to the wrong pharmacy the first time. I clarified which pharmacy it should be sent to, [facility]. They are currently cutting the Entresto 49-51mg  in half while they wait for the new RX to arrive.  Charlann Boxer, CPhT

## 2022-02-10 ENCOUNTER — Ambulatory Visit (INDEPENDENT_AMBULATORY_CARE_PROVIDER_SITE_OTHER): Payer: Medicare HMO

## 2022-02-10 ENCOUNTER — Other Ambulatory Visit: Payer: Medicare HMO

## 2022-02-10 ENCOUNTER — Other Ambulatory Visit: Payer: Self-pay

## 2022-02-10 DIAGNOSIS — Z5181 Encounter for therapeutic drug level monitoring: Secondary | ICD-10-CM | POA: Diagnosis not present

## 2022-02-10 DIAGNOSIS — Z953 Presence of xenogenic heart valve: Secondary | ICD-10-CM

## 2022-02-10 LAB — POCT INR: INR: 2.5 (ref 2.0–3.0)

## 2022-02-10 NOTE — Patient Instructions (Signed)
Description   ?Continue taking Warfarin 1 tablet (2.5mg ) daily.Recheck INR in 5 weeks. Coumadin Clinic [phone].  ?  ?   ?

## 2022-02-13 ENCOUNTER — Encounter (HOSPITAL_COMMUNITY): Payer: Self-pay

## 2022-02-14 ENCOUNTER — Other Ambulatory Visit (HOSPITAL_COMMUNITY): Payer: Self-pay | Admitting: *Deleted

## 2022-02-14 MED ORDER — AMIODARONE HCL 200 MG PO TABS: 200.0000 mg | ORAL_TABLET | Freq: Every day | ORAL | 3 refills | Status: DC

## 2022-02-16 ENCOUNTER — Other Ambulatory Visit (HOSPITAL_COMMUNITY): Payer: Medicare HMO

## 2022-02-17 ENCOUNTER — Ambulatory Visit (HOSPITAL_COMMUNITY)
Admission: RE | Admit: 2022-02-17 | Discharge: 2022-02-17 | Disposition: A | Discharge status: Home / Self Care | Payer: Medicare HMO | Source: Ambulatory Visit | Attending: Cardiology | Admitting: Cardiology

## 2022-02-17 ENCOUNTER — Other Ambulatory Visit: Payer: Self-pay

## 2022-02-17 ENCOUNTER — Encounter (HOSPITAL_COMMUNITY): Payer: Self-pay | Admitting: Cardiology

## 2022-02-17 ENCOUNTER — Other Ambulatory Visit (HOSPITAL_COMMUNITY): Payer: Self-pay | Admitting: *Deleted

## 2022-02-17 DIAGNOSIS — I5022 Chronic systolic (congestive) heart failure: Secondary | ICD-10-CM | POA: Insufficient documentation

## 2022-02-17 LAB — BASIC METABOLIC PANEL
Anion gap: 11 (ref 5–15)
BUN: 32 mg/dL — ABNORMAL HIGH (ref 8–23)
CO2: 22 mmol/L (ref 22–32)
Calcium: 9 mg/dL (ref 8.9–10.3)
Chloride: 107 mmol/L (ref 98–111)
Creatinine, Ser: 2.21 mg/dL — ABNORMAL HIGH (ref 0.61–1.24)
GFR, Estimated: 30 mL/min — ABNORMAL LOW (ref >60)
Glucose, Bld: 141 mg/dL — ABNORMAL HIGH (ref 70–99)
Potassium: 3.9 mmol/L (ref 3.5–5.1)
Sodium: 140 mmol/L (ref 135–145)

## 2022-02-17 MED ORDER — ENTRESTO 24-26 MG PO TABS: 1.0000 | ORAL_TABLET | Freq: Two times a day (BID) | ORAL | 3 refills | Status: DC

## 2022-02-18 ENCOUNTER — Other Ambulatory Visit (HOSPITAL_COMMUNITY): Payer: Self-pay

## 2022-02-18 MED ORDER — ENTRESTO 24-26 MG PO TABS: 1.0000 | ORAL_TABLET | Freq: Two times a day (BID) | ORAL | 3 refills | Status: DC

## 2022-02-23 ENCOUNTER — Other Ambulatory Visit: Payer: Self-pay | Admitting: Internal Medicine

## 2022-03-03 ENCOUNTER — Ambulatory Visit (INDEPENDENT_AMBULATORY_CARE_PROVIDER_SITE_OTHER): Payer: Medicare HMO

## 2022-03-03 DIAGNOSIS — I442 Atrioventricular block, complete: Secondary | ICD-10-CM

## 2022-03-03 LAB — CUP PACEART REMOTE DEVICE CHECK
Battery Remaining Longevity: 107 mo
Battery Voltage: 3.01 V
Brady Statistic AP VP Percent: 0.67 %
Brady Statistic AP VS Percent: 3.02 %
Brady Statistic AS VP Percent: 4.47 %
Brady Statistic AS VS Percent: 91.84 %
Brady Statistic RA Percent Paced: 3.79 %
Brady Statistic RV Percent Paced: 5.14 %
Date Time Interrogation Session: 20230323090455
Implantable Lead Implant Date: 20181211
Implantable Lead Implant Date: 20181211
Implantable Lead Location: 753859
Implantable Lead Location: 753860
Implantable Lead Model: 3830
Implantable Lead Model: 5076
Implantable Pulse Generator Implant Date: 20181211
Lead Channel Impedance Value: 285 Ohm
Lead Channel Impedance Value: 323 Ohm
Lead Channel Impedance Value: 380 Ohm
Lead Channel Impedance Value: 380 Ohm
Lead Channel Pacing Threshold Amplitude: 0.625 V
Lead Channel Pacing Threshold Amplitude: 1.875 V
Lead Channel Pacing Threshold Pulse Width: 0.4 ms
Lead Channel Pacing Threshold Pulse Width: 0.4 ms
Lead Channel Sensing Intrinsic Amplitude: 3 mV
Lead Channel Sensing Intrinsic Amplitude: 3 mV
Lead Channel Sensing Intrinsic Amplitude: 5.75 mV
Lead Channel Sensing Intrinsic Amplitude: 5.75 mV
Lead Channel Setting Pacing Amplitude: 2 V
Lead Channel Setting Pacing Amplitude: 2.5 V
Lead Channel Setting Pacing Pulse Width: 0.7 ms
Lead Channel Setting Sensing Sensitivity: 1.2 mV

## 2022-03-10 NOTE — Progress Notes (Signed)
Remote pacemaker transmission.   

## 2022-03-22 ENCOUNTER — Encounter: Payer: Self-pay | Admitting: Cardiology

## 2022-03-22 ENCOUNTER — Ambulatory Visit (INDEPENDENT_AMBULATORY_CARE_PROVIDER_SITE_OTHER): Payer: Medicare HMO

## 2022-03-22 ENCOUNTER — Ambulatory Visit: Payer: Medicare HMO | Admitting: Cardiology

## 2022-03-22 VITALS — BP 84/61 | HR 102 | Ht 71.0 in | Wt 267.0 lb

## 2022-03-22 DIAGNOSIS — Z953 Presence of xenogenic heart valve: Secondary | ICD-10-CM | POA: Diagnosis not present

## 2022-03-22 DIAGNOSIS — I484 Atypical atrial flutter: Secondary | ICD-10-CM | POA: Diagnosis not present

## 2022-03-22 DIAGNOSIS — I480 Paroxysmal atrial fibrillation: Secondary | ICD-10-CM

## 2022-03-22 DIAGNOSIS — I2510 Atherosclerotic heart disease of native coronary artery without angina pectoris: Secondary | ICD-10-CM

## 2022-03-22 DIAGNOSIS — I4819 Other persistent atrial fibrillation: Secondary | ICD-10-CM

## 2022-03-22 DIAGNOSIS — I442 Atrioventricular block, complete: Secondary | ICD-10-CM

## 2022-03-22 DIAGNOSIS — Z951 Presence of aortocoronary bypass graft: Secondary | ICD-10-CM

## 2022-03-22 DIAGNOSIS — I5022 Chronic systolic (congestive) heart failure: Secondary | ICD-10-CM

## 2022-03-22 DIAGNOSIS — I639 Cerebral infarction, unspecified: Secondary | ICD-10-CM | POA: Diagnosis not present

## 2022-03-22 DIAGNOSIS — Z5181 Encounter for therapeutic drug level monitoring: Secondary | ICD-10-CM

## 2022-03-22 DIAGNOSIS — Z9889 Other specified postprocedural states: Secondary | ICD-10-CM

## 2022-03-22 DIAGNOSIS — Z8679 Personal history of other diseases of the circulatory system: Secondary | ICD-10-CM

## 2022-03-22 LAB — POCT INR: INR: 2.5 (ref 2.0–3.0)

## 2022-03-22 MED ORDER — AMIODARONE HCL 200 MG PO TABS: 100.0000 mg | ORAL_TABLET | Freq: Every day | ORAL | 3 refills | Status: DC

## 2022-03-22 MED ORDER — CARVEDILOL 6.25 MG PO TABS: 6.2500 mg | ORAL_TABLET | Freq: Two times a day (BID) | ORAL | 3 refills | Status: DC

## 2022-03-22 NOTE — Patient Instructions (Addendum)
Medication Instructions:  ?Reduce Carvedilol to 6.25 mg two times a day ?Reduce Amiodarone to 100 mg daily  ?Your physician recommends that you continue on your current medications as directed. Please refer to the Current Medication list given to you today. ?*If you need a refill on your cardiac medications before your next appointment, please call your pharmacy* ? ?Lab Work: ?None. ?If you have labs (blood work) drawn today and your tests are completely normal, you will receive your results only by: ?MyChart Message (if you have MyChart) OR ?A paper copy in the mail ?If you have any lab test that is abnormal or we need to change your treatment, we will call you to review the results. ? ?Testing/Procedures: ?None. ? ?Follow-Up: ?At [facility], you and your health needs are our priority.  As part of our continuing mission to provide you with exceptional heart care, we have created designated Provider Care Teams.  These Care Teams include your primary Cardiologist (physician) and Advanced Practice Providers (APPs -  Physician Assistants and Nurse Practitioners) who all work together to provide you with the care you need, when you need it. ? ?Your physician wants you to follow-up in: 3 months with one of the following Advanced Practice Providers on your designated Care Team:   ? ?Tommye Standard, PA-C ?Legrand Como "Jonni Sanger" [place], PA-C ?  You will receive a reminder letter in the mail two months in advance. If you don't receive a letter, please call our office to schedule the follow-up appointment. ? ?Remote monitoring is used to monitor your Pacemaker  from home. This monitoring reduces the number of office visits required to check your device to one time per year. It allows [place] to keep an eye on the functioning of your device to ensure it is working properly. You are scheduled for a device check from home on 06/02/22. You may send your transmission at any time that day. If you have a wireless device, the transmission will  be sent automatically. After your physician reviews your transmission, you will receive a postcard with your next transmission date. ? ?We recommend signing up for the patient portal called "MyChart".  Sign up information is provided on this After Visit Summary.  MyChart is used to connect with patients for Virtual Visits (Telemedicine).  Patients are able to view lab/test results, encounter notes, upcoming appointments, etc.  Non-urgent messages can be sent to your provider as well.   ?To learn more about what you can do with MyChart, go to [email or web address].   ? ?Any Other Special Instructions Will Be Listed Below (If Applicable). ? ? ? ? ?  ? ? ?

## 2022-03-22 NOTE — Progress Notes (Signed)
? ?Electrophysiology Office Note ? ? ?Date:  03/22/2022  ? ?ID:  Brandon [name], DOB Apr 23, 1946, MRN [id number] ? ?PCP:  [facility]  ?Cardiologist:  Aundra Dubin ?Primary Electrophysiologist:  Viren Lebeau Meredith Leeds, MD   ? ?Chief Complaint: AF ?  ?History of Present Illness: ?Brandon [name] is a 76 y.o. male who is being seen today for the evaluation of AF at the request of Pa, [facility]*. Presenting today for electrophysiology evaluation. ? ?He has a history significant for CVA, atrial fibrillation, valvular heart disease, diastolic heart failure, coronary artery disease.  He has anomalous RCA and has had a vein graft to the PDA.  He was found to have severe mitral regurgitation and moderate aortic stenosis and severe AI.  December 2018 he had bioprosthetic aortic and mitral valves placed.  He had a CABG with vein graft to the PDA, maze, left atrial appendage clipping.  His hospital course was complicated by complete heart block and is now status post [employer]tronic dual-chamber pacemaker. ? ?He was noted to be in atrial flutter and had a TEE guided cardioversion 12/16/2017.  TEE showed an ejection fraction of 25 to 30% and normally functioning valves.  Repeat echo in 2019 showed an improvement of his ejection fraction.  He went back into atrial fibrillation was started on amiodarone.  February 2022 he had a chest CT that showed ILD, usual interstitial pneumonitis.  Amiodarone stopped but developed recurrent atrial fibrillation.  He is now status post repeat ablation 12/16/2021. ? ?Today, denies symptoms of palpitations, chest pain,  orthopnea, PND, lower extremity edema, claudication, dizziness, presyncope, syncope, bleeding, or neurologic sequela. The patient is tolerating medications without difficulties.  He presents to clinic today with low blood pressures and tachycardia.  His heart rate is 102.  It has been fast for the last month.  He states his blood pressures have been low during that  time.  His main complaint is shortness of breath.  He is short of breath walking short distances. ? ?Past Medical History:  ?Diagnosis Date  ? Anemia 07/13/2017  ? Anxiety   ? Aortic insufficiency   ? Aortic stenosis, moderate 07/13/2017  ? Arthritis   ? back   ? Carotid stenosis   ? Right carotid stent (widely patent) 40 - 59% left plaque 11/13  ? CHF (congestive heart failure) ([place])   ? Chronic kidney disease   ? COPD (chronic obstructive pulmonary disease) ([place])   ? Coronary artery disease involving native coronary artery of native heart without angina pectoris   ? Depression   ? Dyslipidemia   ? GERD (gastroesophageal reflux disease)   ? Heart murmur   ? Hemiplegia affecting unspecified side, late effect of cerebrovascular disease   ? resolved- from L side   ? Hypertension   ? Jaundice   ? resolved following ERCP & Cholecystectomy  ? Mild emphysema (HCC)   ? Mitral regurgitation   ? Mitral valve insufficiency and aortic valve insufficiency   ? Myocardial infarction [facility])   ? Paroxysmal atrial fibrillation (HCC)   ? Pre-diabetes   ? per spouse  ? S/P aortic valve replacement with bioprosthetic valve 11/16/2017  ? 23 mm [facility] Ease stented bovine pericardial tissue valve  ? S/P CABG x 1 11/16/2017  ? SVG to PDA with EVH via right thigh  ? S/P Maze operation for atrial fibrillation 11/16/2017  ? Left side lesion set using bipolar radiofrequency and cryothermy ablation with clipping of LA appendage  ?  S/P mitral valve replacement with bioprosthetic valve 11/16/2017  ? 27 mm [facility] Mitral stented bovine pericardial tissue valve  ? Sleep apnea   ? does not wear CPAP  ? Sleep concern   ? resulted in surgery- after + sleep test. Pt. doesn't have a problem any longer.   ? Stroke ([place]) 03/11/2003  ? stent placed on the 31, 3, 2004, L side   ? Wears glasses   ? Wears hearing aid in both ears   ? Wears partial dentures   ? ?Past Surgical History:  ?Procedure Laterality Date  ? AORTIC VALVE REPLACEMENT N/A 11/16/2017  ?  Procedure: AORTIC VALVE REPLACEMENT (AVR) with 60mm Magna Ease;  Surgeon: Rexene Alberts, MD;  Location: [place];  Service: Open Heart Surgery;  Laterality: N/A;  ? ARTERIAL LINE INSERTION Right 11/16/2017  ? Procedure: ARTERIAL LINE INSERTION -RIGHT FEMORAL;  Surgeon: Rexene Alberts, MD;  Location: [place];  Service: Open Heart Surgery;  Laterality: Right;  ? ATRIAL FIBRILLATION ABLATION N/A 12/16/2021  ? Procedure: ATRIAL FIBRILLATION ABLATION;  Surgeon: Constance Haw, MD;  Location: [place] CV LAB;  Service: Cardiovascular;  Laterality: N/A;  ? BACK SURGERY    ? lumbar back  ? CARDIOVERSION N/A 04/11/2018  ? Procedure: CARDIOVERSION;  Surgeon: Larey Dresser, MD;  Location: [facility] ENDOSCOPY;  Service: Cardiovascular;  Laterality: N/A;  ? CARDIOVERSION N/A 06/18/2021  ? Procedure: CARDIOVERSION;  Surgeon: Buford Dresser, MD;  Location: [facility] ENDOSCOPY;  Service: Cardiovascular;  Laterality: N/A;  ? CHOLECYSTECTOMY    ? CORONARY ARTERY BYPASS GRAFT N/A 11/16/2017  ? Procedure: CORONARY ARTERY BYPASS GRAFTING (CABG) x 1;  Surgeon: Rexene Alberts, MD;  Location: [place];  Service: Open Heart Surgery;  Laterality: N/A;  ? ENDOVEIN HARVEST OF GREATER SAPHENOUS VEIN Right 11/16/2017  ? Procedure: ENDOVEIN HARVEST OF GREATER SAPHENOUS VEIN;  Surgeon: Rexene Alberts, MD;  Location: [place];  Service: Open Heart Surgery;  Laterality: Right;  ? ERCP N/A 05/31/2013  ? Procedure: ENDOSCOPIC RETROGRADE CHOLANGIOPANCREATOGRAPHY (ERCP);  Surgeon: Ladene Artist, MD;  Location: [facility] ENDOSCOPY;  Service: Endoscopy;  Laterality: N/A;  ? FOOT SURGERY    ? right  ? IR RADIOLOGY PERIPHERAL GUIDED IV START  09/28/2017  ? IR US GUIDE VASC ACCESS RIGHT  09/28/2017  ? LAPAROSCOPIC CHOLECYSTECTOMY SINGLE PORT N/A 06/01/2013  ? Procedure: LAPAROSCOPIC CHOLECYSTECTOMY SINGLE PORT;  Surgeon: Adin Hector, MD;  [facility];  Service: General;  Laterality: N/A;  ? MAZE N/A 11/16/2017  ? Procedure: MAZE;  Surgeon: Rexene Alberts, MD;   Location: [place];  Service: Open Heart Surgery;  Laterality: N/A;  ? MITRAL VALVE REPAIR N/A 11/16/2017  ? Procedure: MITRAL VALVE  REPLACEMENT with 27mm MagnaEase;  Surgeon: Rexene Alberts, MD;  Location: [place];  Service: Open Heart Surgery;  Laterality: N/A;  ? PACEMAKER IMPLANT N/A 11/21/2017  ? Procedure: PACEMAKER IMPLANT;  Surgeon: Evans Lance, MD;  Location: [place] CV LAB;  Service: Cardiovascular;  Laterality: N/A;  ? POLYPECTOMY    ? RIGHT/LEFT HEART CATH AND CORONARY ANGIOGRAPHY N/A 08/30/2017  ? Procedure: RIGHT/LEFT HEART CATH AND CORONARY ANGIOGRAPHY;  Surgeon: Larey Dresser, MD;  Location: [place] CV LAB;  Service: Cardiovascular;  Laterality: N/A;  ? SHOULDER ARTHROSCOPY WITH ROTATOR CUFF REPAIR AND SUBACROMIAL DECOMPRESSION Left 05/18/2017  ? SHOULDER ARTHROSCOPY WITH ROTATOR CUFF REPAIR AND SUBACROMIAL DECOMPRESSION Left 05/18/2017  ? Procedure: SHOULDER ARTHROSCOPY WITH ROTATOR CUFF REPAIR AND SUBACROMIAL DECOMPRESSION;  Surgeon: Tamera Punt,  Larkin Ina, MD;  Location: [place];  Service: Orthopedics;  Laterality: Left;  LEFT SHOULDER ARTHROSCOPY WITH ROTATOR CUFF REPAIR AND SUBACROMIAL DECOMPRESSION  ? TEE WITHOUT CARDIOVERSION N/A 05/09/2017  ? Procedure: TRANSESOPHAGEAL ECHOCARDIOGRAM (TEE);  Surgeon: Skeet Latch, MD;  Location: [place];  Service: Cardiovascular;  Laterality: N/A;  ? TEE WITHOUT CARDIOVERSION N/A 08/30/2017  ? Procedure: TRANSESOPHAGEAL ECHOCARDIOGRAM (TEE);  Surgeon: Larey Dresser, MD;  Location: [facility] ENDOSCOPY;  Service: Cardiovascular;  Laterality: N/A;  ? TEE WITHOUT CARDIOVERSION N/A 11/16/2017  ? Procedure: TRANSESOPHAGEAL ECHOCARDIOGRAM (TEE);  Surgeon: Rexene Alberts, MD;  Location: [place];  Service: Open Heart Surgery;  Laterality: N/A;  ? TEE WITHOUT CARDIOVERSION N/A 04/11/2018  ? Procedure: TRANSESOPHAGEAL ECHOCARDIOGRAM (TEE);  Surgeon: Larey Dresser, MD;  Location: [facility] ENDOSCOPY;  Service: Cardiovascular;  Laterality: N/A;  ? TEE WITHOUT CARDIOVERSION  N/A 06/18/2021  ? Procedure: TRANSESOPHAGEAL ECHOCARDIOGRAM (TEE);  Surgeon: Buford Dresser, MD;  Location: [facility] ENDOSCOPY;  Service: Cardiovascular;  Laterality: N/A;  ? TONSILLECTOMY    ? ? ?

## 2022-03-22 NOTE — Patient Instructions (Signed)
Description   ?Continue taking Warfarin 1 tablet (2.5mg ) daily.  Recheck INR in 6 weeks. Coumadin Clinic [phone].  ?  ?  ?

## 2022-03-28 ENCOUNTER — Other Ambulatory Visit (HOSPITAL_COMMUNITY): Payer: Self-pay

## 2022-04-04 ENCOUNTER — Encounter: Payer: Self-pay | Admitting: Cardiology

## 2022-04-05 DIAGNOSIS — M25469 Effusion, unspecified knee: Secondary | ICD-10-CM | POA: Diagnosis not present

## 2022-04-05 DIAGNOSIS — T148XXA Other injury of unspecified body region, initial encounter: Secondary | ICD-10-CM | POA: Diagnosis not present

## 2022-04-05 DIAGNOSIS — G8929 Other chronic pain: Secondary | ICD-10-CM | POA: Diagnosis not present

## 2022-04-05 DIAGNOSIS — M549 Dorsalgia, unspecified: Secondary | ICD-10-CM | POA: Diagnosis not present

## 2022-04-05 DIAGNOSIS — K30 Functional dyspepsia: Secondary | ICD-10-CM | POA: Diagnosis not present

## 2022-04-05 DIAGNOSIS — W19XXXA Unspecified fall, initial encounter: Secondary | ICD-10-CM | POA: Diagnosis not present

## 2022-04-05 DIAGNOSIS — F3342 Major depressive disorder, recurrent, in full remission: Secondary | ICD-10-CM | POA: Diagnosis not present

## 2022-04-05 DIAGNOSIS — M25362 Other instability, left knee: Secondary | ICD-10-CM | POA: Diagnosis not present

## 2022-04-05 DIAGNOSIS — F5101 Primary insomnia: Secondary | ICD-10-CM | POA: Diagnosis not present

## 2022-04-05 MED ORDER — AMIODARONE HCL 200 MG PO TABS: 200.0000 mg | ORAL_TABLET | Freq: Every day | ORAL | 3 refills | Status: DC

## 2022-04-05 NOTE — Telephone Encounter (Signed)
Reviewed w/ Dr. Curt Bears. ?Spoke to wife, advised to increase Amiodarone back to 200 mg once daily. ?Advised pt need appt this week w/ PA for atrial overdrive pacing. ?Scheduled to see Oda Kilts, PA Friday for further eval/discussion. ?Wife  verbalized understanding and agreeable to plan.  ? ?

## 2022-04-07 NOTE — Progress Notes (Signed)
? ?Electrophysiology Office Note ? ? ?Date:  04/08/2022  ? ?ID:  Brandon [name], DOB 05/10/1946, MRN [id number] ? ?PCP:  [facility]  ?Cardiologist:  Aundra Dubin ?Primary Electrophysiologist:  Shirley Friar, PA-C   ? ?Chief Complaint: AF ?  ?History of Present Illness: ?Brandon [name] is a 76 y.o. male who is being seen as an acute visit today for the evaluation of atrial flutter by device.  ? ?Seen by Dr. Curt Bears 4/11 and able to be paced out. He denies symptoms of palpitations, chest pain, shortness of breath, orthopnea, PND, lower extremity edema, claudication, dizziness, presyncope, syncope, bleeding, or neurologic sequela. The patient is tolerating medications without difficulties.   ? ?Pt called with recurrent tachycardia and hypotension. Amiodarone increased and scheduled again for pacing out.  ? ?Wife states his HR increased and BP dropped by the time they had gotten home last. Pt remains fatigued overall. Denies palpitation.  ? ?Past Medical History:  ?Diagnosis Date  ? Anemia 07/13/2017  ? Anxiety   ? Aortic insufficiency   ? Aortic stenosis, moderate 07/13/2017  ? Arthritis   ? back   ? Carotid stenosis   ? Right carotid stent (widely patent) 40 - 59% left plaque 11/13  ? CHF (congestive heart failure) ([place])   ? Chronic kidney disease   ? COPD (chronic obstructive pulmonary disease) ([place])   ? Coronary artery disease involving native coronary artery of native heart without angina pectoris   ? Depression   ? Dyslipidemia   ? GERD (gastroesophageal reflux disease)   ? Heart murmur   ? Hemiplegia affecting unspecified side, late effect of cerebrovascular disease   ? resolved- from L side   ? Hypertension   ? Jaundice   ? resolved following ERCP & Cholecystectomy  ? Mild emphysema (HCC)   ? Mitral regurgitation   ? Mitral valve insufficiency and aortic valve insufficiency   ? Myocardial infarction [facility])   ? Paroxysmal atrial fibrillation (HCC)   ? Pre-diabetes   ? per spouse  ? S/P  aortic valve replacement with bioprosthetic valve 11/16/2017  ? 23 mm [facility] Ease stented bovine pericardial tissue valve  ? S/P CABG x 1 11/16/2017  ? SVG to PDA with EVH via right thigh  ? S/P Maze operation for atrial fibrillation 11/16/2017  ? Left side lesion set using bipolar radiofrequency and cryothermy ablation with clipping of LA appendage  ? S/P mitral valve replacement with bioprosthetic valve 11/16/2017  ? 27 mm [facility] Mitral stented bovine pericardial tissue valve  ? Sleep apnea   ? does not wear CPAP  ? Sleep concern   ? resulted in surgery- after + sleep test. Pt. doesn't have a problem any longer.   ? Stroke ( [place]) 03/11/2003  ? stent placed on the 31, 3, 2004, L side   ? Wears glasses   ? Wears hearing aid in both ears   ? Wears partial dentures   ? ?Past Surgical History:  ?Procedure Laterality Date  ? AORTIC VALVE REPLACEMENT N/A 11/16/2017  ? Procedure: AORTIC VALVE REPLACEMENT (AVR) with 29mm Magna Ease;  Surgeon: Rexene Alberts, MD;  Location: [place];  Service: Open Heart Surgery;  Laterality: N/A;  ? ARTERIAL LINE INSERTION Right 11/16/2017  ? Procedure: ARTERIAL LINE INSERTION -RIGHT FEMORAL;  Surgeon: Rexene Alberts, MD;  Location: [place];  Service: Open Heart Surgery;  Laterality: Right;  ? ATRIAL FIBRILLATION ABLATION N/A 12/16/2021  ? Procedure: ATRIAL FIBRILLATION ABLATION;  Surgeon:  Constance Haw, MD;  Location: [place] CV LAB;  Service: Cardiovascular;  Laterality: N/A;  ? BACK SURGERY    ? lumbar back  ? CARDIOVERSION N/A 04/11/2018  ? Procedure: CARDIOVERSION;  Surgeon: Larey Dresser, MD;  Location: [facility] ENDOSCOPY;  Service: Cardiovascular;  Laterality: N/A;  ? CARDIOVERSION N/A 06/18/2021  ? Procedure: CARDIOVERSION;  Surgeon: Buford Dresser, MD;  Location: [facility] ENDOSCOPY;  Service: Cardiovascular;  Laterality: N/A;  ? CHOLECYSTECTOMY    ? CORONARY ARTERY BYPASS GRAFT N/A 11/16/2017  ? Procedure: CORONARY ARTERY BYPASS GRAFTING (CABG) x 1;  Surgeon: Rexene Alberts, MD;  Location: [place];  Service: Open Heart Surgery;  Laterality: N/A;  ? ENDOVEIN HARVEST OF GREATER SAPHENOUS VEIN Right 11/16/2017  ? Procedure: ENDOVEIN HARVEST OF GREATER SAPHENOUS VEIN;  Surgeon: Rexene Alberts, MD;  Location: [place];  Service: Open Heart Surgery;  Laterality: Right;  ? ERCP N/A 05/31/2013  ? Procedure: ENDOSCOPIC RETROGRADE CHOLANGIOPANCREATOGRAPHY (ERCP);  Surgeon: Ladene Artist, MD;  Location[facility];  Service: Endoscopy;  Laterality: N/A;  ? FOOT SURGERY    ? right  ? IR RADIOLOGY PERIPHERAL GUIDED IV START  09/28/2017  ? IR US GUIDE VASC ACCESS RIGHT  09/28/2017  ? LAPAROSCOPIC CHOLECYSTECTOMY SINGLE PORT N/A 06/01/2013  ? Procedure: LAPAROSCOPIC CHOLECYSTECTOMY SINGLE PORT;  Surgeon: Adin Hector, MD;  [facility];  Service: General;  Laterality: N/A;  ? MAZE N/A 11/16/2017  ? Procedure: MAZE;  Surgeon: Rexene Alberts, MD;  Location: [place];  Service: Open Heart Surgery;  Laterality: N/A;  ? MITRAL VALVE REPAIR N/A 11/16/2017  ? Procedure: MITRAL VALVE  REPLACEMENT with 43mm MagnaEase;  Surgeon: Rexene Alberts, MD;  Location: [place];  Service: Open Heart Surgery;  Laterality: N/A;  ? PACEMAKER IMPLANT N/A 11/21/2017  ? Procedure: PACEMAKER IMPLANT;  Surgeon: Evans Lance, MD;  Location: [place] CV LAB;  Service: Cardiovascular;  Laterality: N/A;  ? POLYPECTOMY    ? RIGHT/LEFT HEART CATH AND CORONARY ANGIOGRAPHY N/A 08/30/2017  ? Procedure: RIGHT/LEFT HEART CATH AND CORONARY ANGIOGRAPHY;  Surgeon: Larey Dresser, MD;  Location: [place] CV LAB;  Service: Cardiovascular;  Laterality: N/A;  ? SHOULDER ARTHROSCOPY WITH ROTATOR CUFF REPAIR AND SUBACROMIAL DECOMPRESSION Left 05/18/2017  ? SHOULDER ARTHROSCOPY WITH ROTATOR CUFF REPAIR AND SUBACROMIAL DECOMPRESSION Left 05/18/2017  ? Procedure: SHOULDER ARTHROSCOPY WITH ROTATOR CUFF REPAIR AND SUBACROMIAL DECOMPRESSION;  Surgeon: Tania Ade, MD;  Location: [place];  Service: Orthopedics;  Laterality: Left;   LEFT SHOULDER ARTHROSCOPY WITH ROTATOR CUFF REPAIR AND SUBACROMIAL DECOMPRESSION  ? TEE WITHOUT CARDIOVERSION N/A 05/09/2017  ? Procedure: TRANSESOPHAGEAL ECHOCARDIOGRAM (TEE);  Surgeon: Skeet Latch, MD;  Location: [place];  Service: Cardiovascular;  Laterality: N/A;  ? TEE WITHOUT CARDIOVERSION N/A 08/30/2017  ? Procedure: TRANSESOPHAGEAL ECHOCARDIOGRAM (TEE);  Surgeon: Larey Dresser, MD;  Location: [facility] ENDOSCOPY;  Service: Cardiovascular;  Laterality: N/A;  ? TEE WITHOUT CARDIOVERSION N/A 11/16/2017  ? Procedure: TRANSESOPHAGEAL ECHOCARDIOGRAM (TEE);  Surgeon: Rexene Alberts, MD;  Location: [place];  Service: Open Heart Surgery;  Laterality: N/A;  ? TEE WITHOUT CARDIOVERSION N/A 04/11/2018  ? Procedure: TRANSESOPHAGEAL ECHOCARDIOGRAM (TEE);  Surgeon: Larey Dresser, MD;  Location: [facility] ENDOSCOPY;  Service: Cardiovascular;  Laterality: N/A;  ? TEE WITHOUT CARDIOVERSION N/A 06/18/2021  ? Procedure: TRANSESOPHAGEAL ECHOCARDIOGRAM (TEE);  Surgeon: Buford Dresser, MD;  Location: [facility] ENDOSCOPY;  Service: Cardiovascular;  Laterality: N/A;  ? TONSILLECTOMY    ? ? ? ?Current Outpatient Medications  ?Medication Sig  Dispense Refill  ? acetaminophen (TYLENOL) 500 MG tablet Take 1,000 mg by mouth every 6 (six) hours as needed (pain.).    ? amiodarone (PACERONE) 200 MG tablet Take 1 tablet (200 mg total) by mouth daily. 90 tablet 3  ? Azelastine HCl 137 MCG/SPRAY SOLN Use 1 spray in each nostril 2 times a day 90 mL 3  ? calcium carbonate (TUMS - DOSED IN MG ELEMENTAL CALCIUM) 500 MG chewable tablet Chew 1 tablet by mouth daily as needed for indigestion or heartburn.    ? carvedilol (COREG) 6.25 MG tablet Take 1 tablet (6.25 mg total) by mouth 2 (two) times daily. 180 tablet 3  ? cetirizine (ZYRTEC) 10 MG tablet Take 10 mg by mouth daily as needed for allergies.    ? citalopram (CELEXA) 20 MG tablet Take 1 tablet (20 mg total) by mouth at bedtime. 30 tablet 5  ? docusate sodium (COLACE) 100 MG capsule Take 1 capsule  (100 mg total) by mouth 3 (three) times daily as needed. 20 capsule 0  ? empagliflozin (JARDIANCE) 10 MG TABS tablet TAKE 1 TABLET BY MOUTH ONCE A DAY BEFORE BREAKFAST 90 tablet 3  ? fluticasone (FLONASE) 5

## 2022-04-08 ENCOUNTER — Ambulatory Visit: Payer: Medicare HMO | Admitting: Student

## 2022-04-08 ENCOUNTER — Encounter: Payer: Self-pay | Admitting: Student

## 2022-04-08 ENCOUNTER — Ambulatory Visit (INDEPENDENT_AMBULATORY_CARE_PROVIDER_SITE_OTHER): Payer: Medicare HMO

## 2022-04-08 VITALS — BP 104/66 | HR 101 | Ht 71.0 in | Wt 266.0 lb

## 2022-04-08 DIAGNOSIS — Z953 Presence of xenogenic heart valve: Secondary | ICD-10-CM | POA: Diagnosis not present

## 2022-04-08 DIAGNOSIS — I484 Atypical atrial flutter: Secondary | ICD-10-CM | POA: Diagnosis not present

## 2022-04-08 DIAGNOSIS — Z951 Presence of aortocoronary bypass graft: Secondary | ICD-10-CM

## 2022-04-08 DIAGNOSIS — I480 Paroxysmal atrial fibrillation: Secondary | ICD-10-CM

## 2022-04-08 DIAGNOSIS — Z5181 Encounter for therapeutic drug level monitoring: Secondary | ICD-10-CM

## 2022-04-08 DIAGNOSIS — I5022 Chronic systolic (congestive) heart failure: Secondary | ICD-10-CM

## 2022-04-08 LAB — CUP PACEART INCLINIC DEVICE CHECK
Battery Remaining Longevity: 109 mo
Battery Voltage: 3.01 V
Brady Statistic AP VP Percent: 0.17 %
Brady Statistic AP VS Percent: 7.7 %
Brady Statistic AS VP Percent: 1.04 %
Brady Statistic AS VS Percent: 91.1 %
Brady Statistic RA Percent Paced: 7.88 %
Brady Statistic RV Percent Paced: 1.21 %
Date Time Interrogation Session: 20230428090056
Implantable Lead Implant Date: 20181211
Implantable Lead Implant Date: 20181211
Implantable Lead Location: 753859
Implantable Lead Location: 753860
Implantable Lead Model: 3830
Implantable Lead Model: 5076
Implantable Pulse Generator Implant Date: 20181211
Lead Channel Impedance Value: 285 Ohm
Lead Channel Impedance Value: 342 Ohm
Lead Channel Impedance Value: 380 Ohm
Lead Channel Impedance Value: 399 Ohm
Lead Channel Pacing Threshold Amplitude: 0.625 V
Lead Channel Pacing Threshold Amplitude: 1.75 V
Lead Channel Pacing Threshold Pulse Width: 0.4 ms
Lead Channel Pacing Threshold Pulse Width: 0.4 ms
Lead Channel Sensing Intrinsic Amplitude: 3.875 mV
Lead Channel Sensing Intrinsic Amplitude: 3.875 mV
Lead Channel Sensing Intrinsic Amplitude: 5.625 mV
Lead Channel Sensing Intrinsic Amplitude: 5.625 mV
Lead Channel Setting Pacing Amplitude: 2 V
Lead Channel Setting Pacing Amplitude: 2.5 V
Lead Channel Setting Pacing Pulse Width: 0.7 ms
Lead Channel Setting Sensing Sensitivity: 1.2 mV

## 2022-04-08 LAB — POCT INR: INR: 3.8 — AB (ref 2.0–3.0)

## 2022-04-08 NOTE — Patient Instructions (Signed)

## 2022-04-08 NOTE — Patient Instructions (Addendum)
Description   ?Hold today's dose and then continue taking Warfarin 1 tablet (2.5mg ) daily.   ?Recheck INR in 3 weeks.  ?Coumadin Clinic [phone].  ?  ?   ?   ?

## 2022-04-14 ENCOUNTER — Encounter (HOSPITAL_COMMUNITY): Payer: Self-pay | Admitting: Cardiology

## 2022-04-14 ENCOUNTER — Ambulatory Visit (HOSPITAL_COMMUNITY)
Admission: RE | Admit: 2022-04-14 | Discharge: 2022-04-14 | Disposition: A | Discharge status: Home / Self Care | Payer: Medicare HMO | Source: Ambulatory Visit | Attending: Cardiology | Admitting: Cardiology

## 2022-04-14 VITALS — BP 110/70 | HR 60 | Wt 264.6 lb

## 2022-04-14 DIAGNOSIS — J849 Interstitial pulmonary disease, unspecified: Secondary | ICD-10-CM | POA: Insufficient documentation

## 2022-04-14 DIAGNOSIS — I4892 Unspecified atrial flutter: Secondary | ICD-10-CM | POA: Diagnosis not present

## 2022-04-14 DIAGNOSIS — R9431 Abnormal electrocardiogram [ECG] [EKG]: Secondary | ICD-10-CM | POA: Insufficient documentation

## 2022-04-14 DIAGNOSIS — I38 Endocarditis, valve unspecified: Secondary | ICD-10-CM | POA: Diagnosis not present

## 2022-04-14 DIAGNOSIS — I442 Atrioventricular block, complete: Secondary | ICD-10-CM | POA: Diagnosis not present

## 2022-04-14 DIAGNOSIS — Z79899 Other long term (current) drug therapy: Secondary | ICD-10-CM | POA: Diagnosis not present

## 2022-04-14 DIAGNOSIS — Z7182 Exercise counseling: Secondary | ICD-10-CM | POA: Insufficient documentation

## 2022-04-14 DIAGNOSIS — N183 Chronic kidney disease, stage 3 unspecified: Secondary | ICD-10-CM | POA: Diagnosis not present

## 2022-04-14 DIAGNOSIS — J449 Chronic obstructive pulmonary disease, unspecified: Secondary | ICD-10-CM | POA: Insufficient documentation

## 2022-04-14 DIAGNOSIS — Z7984 Long term (current) use of oral hypoglycemic drugs: Secondary | ICD-10-CM | POA: Insufficient documentation

## 2022-04-14 DIAGNOSIS — I447 Left bundle-branch block, unspecified: Secondary | ICD-10-CM | POA: Insufficient documentation

## 2022-04-14 DIAGNOSIS — Z7901 Long term (current) use of anticoagulants: Secondary | ICD-10-CM | POA: Diagnosis not present

## 2022-04-14 DIAGNOSIS — Z8701 Personal history of pneumonia (recurrent): Secondary | ICD-10-CM | POA: Insufficient documentation

## 2022-04-14 DIAGNOSIS — Z8673 Personal history of transient ischemic attack (TIA), and cerebral infarction without residual deficits: Secondary | ICD-10-CM | POA: Diagnosis not present

## 2022-04-14 DIAGNOSIS — I5042 Chronic combined systolic (congestive) and diastolic (congestive) heart failure: Secondary | ICD-10-CM | POA: Diagnosis not present

## 2022-04-14 DIAGNOSIS — Z8249 Family history of ischemic heart disease and other diseases of the circulatory system: Secondary | ICD-10-CM | POA: Insufficient documentation

## 2022-04-14 DIAGNOSIS — Z87891 Personal history of nicotine dependence: Secondary | ICD-10-CM | POA: Diagnosis not present

## 2022-04-14 DIAGNOSIS — Z713 Dietary counseling and surveillance: Secondary | ICD-10-CM | POA: Insufficient documentation

## 2022-04-14 DIAGNOSIS — I352 Nonrheumatic aortic (valve) stenosis with insufficiency: Secondary | ICD-10-CM | POA: Insufficient documentation

## 2022-04-14 DIAGNOSIS — E785 Hyperlipidemia, unspecified: Secondary | ICD-10-CM | POA: Insufficient documentation

## 2022-04-14 DIAGNOSIS — Z955 Presence of coronary angioplasty implant and graft: Secondary | ICD-10-CM | POA: Insufficient documentation

## 2022-04-14 DIAGNOSIS — G4733 Obstructive sleep apnea (adult) (pediatric): Secondary | ICD-10-CM | POA: Diagnosis not present

## 2022-04-14 DIAGNOSIS — I480 Paroxysmal atrial fibrillation: Secondary | ICD-10-CM

## 2022-04-14 DIAGNOSIS — I471 Supraventricular tachycardia: Secondary | ICD-10-CM | POA: Diagnosis not present

## 2022-04-14 DIAGNOSIS — D509 Iron deficiency anemia, unspecified: Secondary | ICD-10-CM | POA: Diagnosis not present

## 2022-04-14 DIAGNOSIS — I130 Hypertensive heart and chronic kidney disease with heart failure and stage 1 through stage 4 chronic kidney disease, or unspecified chronic kidney disease: Secondary | ICD-10-CM | POA: Diagnosis not present

## 2022-04-14 DIAGNOSIS — Z823 Family history of stroke: Secondary | ICD-10-CM | POA: Insufficient documentation

## 2022-04-14 DIAGNOSIS — I2510 Atherosclerotic heart disease of native coronary artery without angina pectoris: Secondary | ICD-10-CM | POA: Insufficient documentation

## 2022-04-14 DIAGNOSIS — Z953 Presence of xenogenic heart valve: Secondary | ICD-10-CM

## 2022-04-14 DIAGNOSIS — E669 Obesity, unspecified: Secondary | ICD-10-CM | POA: Insufficient documentation

## 2022-04-14 DIAGNOSIS — I5022 Chronic systolic (congestive) heart failure: Secondary | ICD-10-CM

## 2022-04-14 LAB — TSH: TSH: 3.916 u[IU]/mL (ref 0.350–4.500)

## 2022-04-14 LAB — COMPREHENSIVE METABOLIC PANEL
ALT: 17 U/L (ref 0–44)
AST: 24 U/L (ref 15–41)
Albumin: 3.8 g/dL (ref 3.5–5.0)
Alkaline Phosphatase: 41 U/L (ref 38–126)
Anion gap: 12 (ref 5–15)
BUN: 38 mg/dL — ABNORMAL HIGH (ref 8–23)
CO2: 22 mmol/L (ref 22–32)
Calcium: 8.9 mg/dL (ref 8.9–10.3)
Chloride: 104 mmol/L (ref 98–111)
Creatinine, Ser: 2.87 mg/dL — ABNORMAL HIGH (ref 0.61–1.24)
GFR, Estimated: 22 mL/min — ABNORMAL LOW (ref >60)
Glucose, Bld: 139 mg/dL — ABNORMAL HIGH (ref 70–99)
Potassium: 3.9 mmol/L (ref 3.5–5.1)
Sodium: 138 mmol/L (ref 135–145)
Total Bilirubin: 0.8 mg/dL (ref 0.3–1.2)
Total Protein: 7.4 g/dL (ref 6.5–8.1)

## 2022-04-14 LAB — BRAIN NATRIURETIC PEPTIDE: B Natriuretic Peptide: 137.1 pg/mL — ABNORMAL HIGH (ref 0.0–100.0)

## 2022-04-14 NOTE — Patient Instructions (Signed)
There has been no changes to your medications. ? ?Labs done today, your results will be available in MyChart, we will contact you for abnormal readings. ? ?You have been referred to [facility]. They will call you to arrange your appointment. ** ? ?Your physician recommends that you schedule a follow-up appointment in: 3-4 months. ? ?If you have any questions or concerns before your next appointment please send [place] a message through [place] or call our office at [phone].   ? ?TO LEAVE A MESSAGE FOR THE NURSE SELECT OPTION 2, PLEASE LEAVE A MESSAGE INCLUDING: ?YOUR NAME ?DATE OF BIRTH ?CALL BACK NUMBER ?REASON FOR CALL**this is important as we prioritize the call backs ? ?YOU WILL RECEIVE A CALL BACK THE SAME DAY AS LONG AS YOU CALL BEFORE 4:00 PM ? ?At the [facility], you and your health needs are our priority. As part of our continuing mission to provide you with exceptional heart care, we have created designated Provider Care Teams. These Care Teams include your primary Cardiologist (physician) and Advanced Practice Providers (APPs- Physician Assistants and Nurse Practitioners) who all work together to provide you with the care you need, when you need it.  ? ?You may see any of the following providers on your designated Care Team at your next follow up: ?Dr Glori Bickers ?Dr Loralie Champagne ?Darrick Grinder, NP ?Lyda Jester, PA ?Jessica Milford,NP ?Marlyce Huge, PA ?Audry Riles, PharmD ? ? ?Please be sure to bring in all your medications bottles to every appointment.  ? ? ? ?

## 2022-04-15 ENCOUNTER — Encounter (HOSPITAL_COMMUNITY): Payer: Self-pay

## 2022-04-15 ENCOUNTER — Telehealth (HOSPITAL_COMMUNITY): Payer: Self-pay | Admitting: Cardiology

## 2022-04-15 DIAGNOSIS — I5022 Chronic systolic (congestive) heart failure: Secondary | ICD-10-CM

## 2022-04-15 MED ORDER — FUROSEMIDE 40 MG PO TABS: 40.0000 mg | ORAL_TABLET | Freq: Every day | ORAL | 3 refills | Status: DC

## 2022-04-15 NOTE — Telephone Encounter (Signed)
-----   Message from Larey Dresser, MD sent at 04/14/2022  8:59 PM EDT ----- ?Hold Lasix for a day and then decrease to 40 mg daily.  Repeat BMET Monday or Tuesday.  ?

## 2022-04-15 NOTE — Telephone Encounter (Signed)
Patient called.  Patient aware via aware repeat labs 5/11 ?

## 2022-04-17 NOTE — Progress Notes (Signed)
?Advanced Heart Failure Clinic Note  ? ?PCP: Dr. Justin Mend ?Cardiology: Dr. Radford Pax ?HF Cardiology: Dr. Aundra Dubin ? ?76 y.o.with history of CVA, paroxysmal atrial fibrillation, valvular heart disease, and chronic diastolic CHF presents for evaluation of CHF and valvular heart disease.   ? ?He had had episodes of dyspnea and initial workup led to a TEE in 5/18 showing normal EF with moderate AS and moderate MR.  He continued to have episodic dyspnea and ended up admitted in 8/18 with shortness of breath and chest pressure.  This led to a long, complicated hospitalization.  He was noted to be volume overloaded with acute diastolic CHF and was also noted to have symptomatic runs of atrial fibrillation with RVR.  Amiodarone was started to control the atrial fibrillation.  He developed respiratory distress => Bipap => intubated.  He became hypotensive, requiring pressors.  He developed AKI.  PNA was noted and he received broad spectrum abx.  He had a prolonged intubation but was eventually extubated.  Given deconditioning from long hospitalization, he went to CIR for a couple of weeks.  LHC in 9/18 showed occluded PDA with left>right collaterals.  TEE in 9/18 showed EF 50%, severe MR (possibly infarct-related with restricted posterior leaflet, and moderate AS + moderate-severe AI (possibly rheumatic).  ? ?In 12/18, he had cardiac surgery with bioprosthetic aortic and mitral valves placed.  He had SVG-PDA, Maze, and LA appendage clipping.  Post-op course was complicated by CHB requiring Medtronic dual chamber PPM (His bundle lead placed but captured right bundle so induced LBBB).  ? ?Echo 01/02/18 LVEF 30-35%, bioprosthetic MV and AoV function normally, RV mildly dilated with normal systolic function.   ? ?He was noted to be in atrial flutter with mild RVR and had TEE-guided DCCV on 04/11/18. TEE showed EF 25-30%, normal bioprosthetic aortic and mitral valves.  Echo in 6/19 showed EF 30-35%, normal bioprosthetic aortic and mitral  valves.  Echo 12/19 with EF up to 50-55%, normal bioprosthetic mitral and aortic valves, normal RV.  ? ?In 2020, he went back into atrial fibrillation.  Amiodarone was started, and he returned to NSR.  ? ?Echo in 11/21 showed EF 50-55%, mild LVH, mildly dilated RV with normal function, bioprosthetic mitral valve with mean gradient 5 mmHg and no MR, bioprosthetic aortic valve with mean gradient 12 and no AI.  PFTs in 1/22 were restrictive.  High resolution CT chest was done in 2/22, concerning for ILD, usual interstitial pneumonitis.  He saw pulmonary, ESR was mildly elevated.  We decided to try him off amiodarone given concern for possible amiodarone-induced lung toxicity.  He then developed atrial fibrillation off amiodarone, was quite symptomatic.  He underwent TEE-guided DCCV back to NSR in 7/22 and amiodarone was restarted.  TEE showed EF 50-55%, mid septal HK, RV normal, bioprosthetic aortic and mitral valves appeared normal.  ? ?EP tried decreasing amiodarone to 100 mg daily.  However, patient then developed runs of atrial tachycardia (had to be paced out).  Amiodarone was increased back to 200 mg daily.  ? ?He returns today for followup of CHF and valvular disease.  He is a-paced today.  Weight is up 3 lbs.  No palpitations.  He fell about 2 wks ago when his left knee gave out.  He walks with a cane.  Mild dyspnea walking up an incline.  He cannot walk much due to knee problems, will be seeing orthopedics.  Weight down 3 lbs.  No chest pain.  No lightheadedness.  ? ?ECG (personally  reviewed): a-paced with long AV delay, LBBB 150 msec.  ? ?Labs (9/18): K 4.5, creatinine 1.42, LDL 90, HDL 36, LFTs normal, TSH normal, hgb 9.6 ?Labs (10/18): K 4.3, creatinine 1.45 ?Labs (12/18): K 3.8, creatinine 1.56 ?Labs (1/19): creatinine 1.68, LDL 66 ?Labs (3/19): K 4.2, creatinine 1.56 ?Labs (4/19): K 4.4, creatinine 1.71, hgb 11.8, LDL 75, HDL 43 ?Labs (6/19): K 4.8, creatinine 2.1, LDL 75, TGs 246 ?Labs (8/19): K 4.6,  creatinine 2.07 ?Labs (11/19): K 4.6, creatinine 1.29 ?Labs (2/20): K 4.8, creatinine 1.98 ?Labs (5/20): LDL 49, HDL 39 ?Labs (7/20): TSH normal ?Labs (9/20): TSH normal, K 4.4, creatinine 2.03, hgb 13.5, LFTs normal ?Labs (12/20): K 4.4, creatinine 1.87, hgb 13.1.  ?Labs (11/21): LDL 54, K 4.6, creatinine 2.22, LFTs normal, TSH normal ?Labs (7/22): TSH normal, K 4.5, creatinine 2.98, hgb 13.7 ?Labs (8/22): K 4.4, creatinine 2.23 ?Labs (2/23): BNP 299 ?Labs (3/23): K 3.9, creatinine 2.21 ? ?PMH: ?1. CVA: Right MCA, 2004.  Minimal residual problems.  ?2. HTN ?- Peripheral edema with amlodipine.  ?3. Hyperlipidemia ?4. Left rotator cuff surgery 6/18 ?5. Carotid stenosis: s/p right carotid stent.  ?6. GERD ?7. H/o CCY ?8. Anemia: FOBT negative.  ?9. Gout  ?10. Atrial fibrillation: Paroxysmal. Maze in 12/18 with LAA clipping.  ?- Recurrent atrial fibrillation 2020 => amiodarone.  ?- Recurrent atrial fibrillation off amiodarone in 7/22, DCCV to NSR and amiodarone restarted.  ?11. Valvular heart disease: TEE (5/18) with EF 55-60%, moderate AS mean 33 mmHg, moderate MR, normal RV size and systolic function.  ?- Echo (8/18): EF 55-60%, moderate LVH, moderate AS with mean gradient 33 mmHg, moderate AI, moderate to severe MR.  ?- TEE (9/18): EF 50%, mild LV dilation, suspect severe MR with posterior leaflet restricted (?infarct-related MR), ERO 0.4 cm^2, moderate AS/moderate-severe AI (possibly rheumatic).   ?- In 12/18, he had cardiac surgery with bioprosthetic aortic and mitral valves placed.  He had SVG-PDA, Maze, and LA appendage clipping. ?- Echo (1/19): LVEF 30-35%, bioprosthetic MV and AoV function normally, RV mildly dilated with normal systolic function.  ?- TEE (5/19) with EF 25-30%, normal bioprosthetic aortic valve, normal bioprosthetic mitral valve. ?- Echo (6/19): EF 30-35%, mild LV dilation with diffuse hypokinesis, normal RV size with mildly decreased systolic function, normal-appearing bioprosthetic mitral  and aortic valves.   ?- Echo (12/19): EF 50-55%, moderate LVH, bioprosthetic aortic valve with mean gradient 10 mmHg, normally-functioning bioprosthetic mitral valve.  ?- Echo (11/21): EF 50-55%, mild LVH, mildly dilated RV with normal function, bioprosthetic mitral valve with mean gradient 5 mmHg and no MR, bioprosthetic aortic valve with mean gradient 12 and no AI.   ?- TEE (7/22): EF 50-55%, septal HK, normal RV size/systolic function, LA appendage clipped, bioprosthetic aortic and mitral valves appeared normal.  ?12. Chronic systolic CHF ?13. Deafness ?14. CKD: stage 3.  ?15. Suspect COPD ?16. 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  ?17. COPD: Mild obstruction on 9/18 PFTs.  ?- PFTs (10/20): Mild obstruction, decreased DLCO.  ?18. Post-op CHB in 12/18 with Medtronic dual chamber PPM.  ?19. Chronic systolic CHF: Echo (0/17) with EF down to 30-35% post-op.  ?- TEE (5/19) with EF 25-30%, normal bioprosthetic aortic valve, normal bioprosthetic mitral valve.  ?- Echo (12/19): EF 50-55%, moderate LVH, bioprosthetic aortic valve with mean gradient 10 mmHg, normally-functioning bioprosthetic mitral valve.  ?20. Atrial flutter: DCCV in  5/19.  ?21. OSA: Not using CPAP.  ?22. Interstitial lung disease: PFTs in 1/22 concerning for restriction.  High resolution chest CT in 2/22 with possible UIP.  ?23. Atrial tachycardia: Pace-terminated.  ? ?SH: Quit smoking 8/18.  Married, 2 children, retired.   ? ?Family History  ?Problem Relation Age of Onset  ? Stroke Father   ?     No details  ? Angina Mother   ? Kidney cancer Brother   ? ?Review of systems complete and found to be negative unless listed in HPI.   ? ?Current Outpatient Medications  ?Medication Sig Dispense Refill  ? acetaminophen (TYLENOL) 500 MG tablet Take 1,000 mg by mouth every 6 (six) hours as needed (pain.).    ? amiodarone (PACERONE) 200  MG tablet Take 1 tablet (200 mg total) by mouth daily. 90 tablet 3  ? Azelastine HCl 137 MCG/SPRAY SOLN Use 1 spray in each nostril 2 times a day 90 mL 3  ? calcium carbonate (TUMS - DOSED IN MG ELEMENTAL C

## 2022-04-18 ENCOUNTER — Other Ambulatory Visit (HOSPITAL_COMMUNITY): Payer: Medicare HMO

## 2022-04-19 ENCOUNTER — Ambulatory Visit: Payer: Medicare HMO | Admitting: Pharmacist

## 2022-04-19 ENCOUNTER — Telehealth: Payer: Self-pay | Admitting: Pharmacist

## 2022-04-19 ENCOUNTER — Other Ambulatory Visit (HOSPITAL_COMMUNITY): Payer: Self-pay

## 2022-04-19 VITALS — BP 102/70 | HR 100 | Resp 14 | Ht 71.0 in | Wt 268.8 lb

## 2022-04-19 DIAGNOSIS — I5022 Chronic systolic (congestive) heart failure: Secondary | ICD-10-CM

## 2022-04-19 DIAGNOSIS — E119 Type 2 diabetes mellitus without complications: Secondary | ICD-10-CM

## 2022-04-19 DIAGNOSIS — E1122 Type 2 diabetes mellitus with diabetic chronic kidney disease: Secondary | ICD-10-CM

## 2022-04-19 NOTE — Progress Notes (Signed)
Patient ID: Brandon [name]                 DOB: [birth year]                    MRN: [id number] ? ? ? ? ?HPI: ?Brandon [name] is a 76 y.o. male patient referred to pharmacy clinic by Dr Aundra Dubin to initiate weight loss therapy with GLP1-RA. PMH is significant for HTN, CVA, CHF, COPD, T2DM, CKD, and MVR. Most recent BMI 37.51. ? ?Patient presents today with spouse who does the majority of the talking and provide history. Wife reports patient does not follow a diet and prefers to eat fried foods.   ? ?Patient last A1c 6.3 on 08/09/2021. Has previously been up to 6.6 on 07/12/19.  Needs A1c updated and has current order for BMP pending. ? ?Patient is not able to be very physically active. Walks with a cane and moves very slowly.  ? ? ?Labs: ?Lab Results  ?Component Value Date  ? HGBA1C 6.0 (H) 11/13/2017  ? ? ?Wt Readings from Last 1 Encounters:  ?04/19/22 268 lb 12.8 oz (121.9 kg)  ? ? ?BP Readings from Last 1 Encounters:  ?04/19/22 102/70  ? ?Pulse Readings from Last 1 Encounters:  ?04/19/22 100  ? ? ?   ?Component Value Date/Time  ? CHOL 125 10/07/2021 1244  ? CHOL 120 12/27/2012 0912  ? TRIG 84 10/07/2021 1244  ? TRIG 115 12/27/2012 0912  ? HDL 64 10/07/2021 1244  ? HDL 40 12/27/2012 0912  ? CHOLHDL 2.0 10/07/2021 1244  ? VLDL 17 10/07/2021 1244  ? LDLCALC 44 10/07/2021 1244  ? [place] 57 12/27/2012 0912  ? ? ?Past Medical History:  ?Diagnosis Date  ? Anemia 07/13/2017  ? Anxiety   ? Aortic insufficiency   ? Aortic stenosis, moderate 07/13/2017  ? Arthritis   ? back   ? Carotid stenosis   ? Right carotid stent (widely patent) 40 - 59% left plaque 11/13  ? CHF (congestive heart failure) ([place])   ? Chronic kidney disease   ? COPD (chronic obstructive pulmonary disease) ([place])   ? Coronary artery disease involving native coronary artery of native heart without angina pectoris   ? Depression   ? Dyslipidemia   ? GERD (gastroesophageal reflux disease)   ? Heart murmur   ? Hemiplegia affecting unspecified side, late effect of  cerebrovascular disease   ? resolved- from L side   ? Hypertension   ? Jaundice   ? resolved following ERCP & Cholecystectomy  ? Mild emphysema (HCC)   ? Mitral regurgitation   ? Mitral valve insufficiency and aortic valve insufficiency   ? Myocardial infarction [facility])   ? Paroxysmal atrial fibrillation (HCC)   ? Pre-diabetes   ? per spouse  ? S/P aortic valve replacement with bioprosthetic valve 11/16/2017  ? 23 mm [facility] Ease stented bovine pericardial tissue valve  ? S/P CABG x 1 11/16/2017  ? SVG to PDA with EVH via right thigh  ? S/P Maze operation for atrial fibrillation 11/16/2017  ? Left side lesion set using bipolar radiofrequency and cryothermy ablation with clipping of LA appendage  ? S/P mitral valve replacement with bioprosthetic valve 11/16/2017  ? 27 mm [facility] Mitral stented bovine pericardial tissue valve  ? Sleep apnea   ? does not wear CPAP  ? Sleep concern   ? resulted in surgery- after + sleep test. Pt. doesn't have a problem any longer.   ?  Stroke ([place]) 03/11/2003  ? stent placed on the 31, 3, 2004, L side   ? Wears glasses   ? Wears hearing aid in both ears   ? Wears partial dentures   ? ? ?Current Outpatient Medications on File Prior to Visit  ?Medication Sig Dispense Refill  ? acetaminophen (TYLENOL) 500 MG tablet Take 1,000 mg by mouth every 6 (six) hours as needed (pain.).    ? amiodarone (PACERONE) 200 MG tablet Take 1 tablet (200 mg total) by mouth daily. 90 tablet 3  ? Azelastine HCl 137 MCG/SPRAY SOLN Use 1 spray in each nostril 2 times a day 90 mL 3  ? calcium carbonate (TUMS - DOSED IN MG ELEMENTAL CALCIUM) 500 MG chewable tablet Chew 1 tablet by mouth daily as needed for indigestion or heartburn.    ? carvedilol (COREG) 6.25 MG tablet Take 1 tablet (6.25 mg total) by mouth 2 (two) times daily. 180 tablet 3  ? cetirizine (ZYRTEC) 10 MG tablet Take 10 mg by mouth daily as needed for allergies.    ? citalopram (CELEXA) 20 MG tablet Take 1 tablet (20 mg total) by mouth at  bedtime. 30 tablet 5  ? docusate sodium (COLACE) 100 MG capsule Take 1 capsule (100 mg total) by mouth 3 (three) times daily as needed. 20 capsule 0  ? empagliflozin (JARDIANCE) 10 MG TABS tablet TAKE 1 TABLET BY MOUTH ONCE A DAY BEFORE BREAKFAST 90 tablet 3  ? fluticasone (FLONASE) 50 MCG/ACT nasal spray Place into both nostrils daily as needed for allergies or rhinitis.    ? furosemide (LASIX) 40 MG tablet Take 1 tablet (40 mg total) by mouth daily. 90 tablet 3  ? loratadine (CLARITIN) 10 MG tablet Take 10 mg by mouth daily as needed for allergies or rhinitis.    ? methocarbamol (ROBAXIN) 500 MG tablet Take 1 tablet (500 mg total) by mouth every 8 (eight) hours as needed for muscle spasms. 30 tablet 0  ? pantoprazole (PROTONIX) 40 MG tablet Take 1 tablet (40 mg total) by mouth daily. 90 tablet 3  ? Pseudoeph-Doxylamine-DM-APAP (NYQUIL PO) Take 30 mLs by mouth daily as needed ([place]).    ? rosuvastatin (CRESTOR) 10 MG tablet TAKE 1 TABLET EVERY DAY 90 tablet 0  ? sacubitril-valsartan (ENTRESTO) 24-26 MG Take 1 tablet by mouth 2 (two) times daily. 180 tablet 3  ? spironolactone (ALDACTONE) 25 MG tablet TAKE 1 TABLET EVERY DAY 90 tablet 3  ? traZODone (DESYREL) 50 MG tablet Take 2 tablets (100 mg total) by mouth at bedtime as needed. 180 tablet 3  ? warfarin (COUMADIN) 2.5 MG tablet daily. Patient takes 2.5 mg tablet by mouth daily as directed by the coumadin clinic.    ? ?No current facility-administered medications on file prior to visit.  ? ? ?Allergies  ?Allergen Reactions  ? Amlodipine Swelling  ? Bee Venom Anaphylaxis and Swelling  ? Bacitracin-Polymyxin B Other (See Comments)  ?  Redness ?Other reaction(s): rash  ? ? ? ?Assessment/Plan: ? ?1. Weight loss - Patient's most recent A1c 6.3 and BMI 37.51 and is interested in losing weight. Will order A1c today.  Sicne patient previously had A1c of 6.6, recommend starting on Ozempic once weekly.  Using demo pen educated pateitn on wife on mechanism of  action, storage, priming the pen, site selection, administration, and possible adverse effects. Advised he should reduce amount of fatty foods he is eating to reduce GI adverse effects.  Confirmed patient has no personal or family history  of medullary thyroid carcinoma (MTC) or Multiple Endocrine Neoplasia syndrome type 2 (MEN 2).  ? ?Advised patient on common side effects including nausea, diarrhea, dyspepsia, decreased appetite, and fatigue. Counseled patient on reducing meal size and how to titrate medication to minimize side effects. Counseled patient to call if intolerable side effects or if experiencing dehydration, abdominal pain, or dizziness. ? ?Karren Cobble, PharmD, BCACP, [place], CPP ?[address], [address] ?[place], [place], [address] ?Phone: [phone], Fax: [phone]  ?

## 2022-04-19 NOTE — Telephone Encounter (Signed)
PA for Ozempic submitted. Key: [id number].  Approved. Contacted patient's wife who told me that husband is not interested in starting Ozempic. Does not want to give himself weekly injections. Advised I would let Dr Aundra Dubin know, ?

## 2022-04-19 NOTE — Patient Instructions (Signed)
It was nice meeting you today ? ?We would like to start a new medication called Ozempic which you will inject once a week.  Your dose will be 0.25mg  once a week for 4 weeks.  At week 5, you will increase the dose to 0.5mg  once a week. ? ?We will also check an A1c today ? ?We will complete the prior authorization for you and contact you when it is approved ? ?Please call with any questions ? ?Karren Cobble, PharmD, BCACP, [place], CPP ?[address], [address] ?[place], [place], [address] ?Phone: [phone], Fax: [phone]  ? ?

## 2022-04-20 LAB — HEMOGLOBIN A1C
Est. average glucose Bld gHb Est-mCnc: 143 mg/dL
Hgb A1c MFr Bld: 6.6 % — ABNORMAL HIGH (ref 4.8–5.6)

## 2022-04-22 ENCOUNTER — Ambulatory Visit (HOSPITAL_COMMUNITY)
Admission: RE | Admit: 2022-04-22 | Discharge: 2022-04-22 | Disposition: A | Discharge status: Home / Self Care | Payer: Medicare HMO | Source: Ambulatory Visit | Attending: Cardiology | Admitting: Cardiology

## 2022-04-22 DIAGNOSIS — I5022 Chronic systolic (congestive) heart failure: Secondary | ICD-10-CM | POA: Diagnosis not present

## 2022-04-22 LAB — BASIC METABOLIC PANEL
Anion gap: 9 (ref 5–15)
BUN: 33 mg/dL — ABNORMAL HIGH (ref 8–23)
CO2: 23 mmol/L (ref 22–32)
Calcium: 9 mg/dL (ref 8.9–10.3)
Chloride: 106 mmol/L (ref 98–111)
Creatinine, Ser: 2.29 mg/dL — ABNORMAL HIGH (ref 0.61–1.24)
GFR, Estimated: 29 mL/min — ABNORMAL LOW (ref >60)
Glucose, Bld: 107 mg/dL — ABNORMAL HIGH (ref 70–99)
Potassium: 4.4 mmol/L (ref 3.5–5.1)
Sodium: 138 mmol/L (ref 135–145)

## 2022-04-25 ENCOUNTER — Other Ambulatory Visit (HOSPITAL_COMMUNITY): Payer: Self-pay

## 2022-04-25 MED ORDER — ENTRESTO 24-26 MG PO TABS: 1.0000 | ORAL_TABLET | Freq: Two times a day (BID) | ORAL | 3 refills | Status: DC

## 2022-04-25 NOTE — Progress Notes (Signed)
Electrophysiology Office Note   Date:  05/02/2022   ID:  Brandon [name], Brandon [name] 1946-03-11, MRN [id number]  PCP:  [facility]  Cardiologist:  Aundra Dubin Primary Electrophysiologist:  Dr. Curt Bears  Chief Complaint: AF   History of Present Illness: Brandon [name] is a 76 y.o. male who is being seen for routine EP follow up.   Seen by Dr. Curt Bears 4/11 and able to be paced out. He denies symptoms of palpitations, chest pain, shortness of breath, orthopnea, PND, lower extremity edema, claudication, dizziness, presyncope, syncope, bleeding, or neurologic sequela. The patient is tolerating medications without difficulties.    Pt seen by EPAPP 4/28 for same and paced out again.   Today, HR again in low 100s. Has only been in NSR about 1/4 of the time since last visit. Overall feeling OK though. Not very active, worried about falls. No falls since last visit but they're worried about him getting dizzy and falling with elevated HRs. SOB with moderate exertion.   Past Medical History:  Diagnosis Date   Anemia 07/13/2017   Anxiety    Aortic insufficiency    Aortic stenosis, moderate 07/13/2017   Arthritis    back    Carotid stenosis    Right carotid stent (widely patent) 40 - 59% left plaque 11/13   CHF (congestive heart failure) (HCC)    Chronic kidney disease    COPD (chronic obstructive pulmonary disease) ([place])    Coronary artery disease involving native coronary artery of native heart without angina pectoris    Depression    Dyslipidemia    GERD (gastroesophageal reflux disease)    Heart murmur    Hemiplegia affecting unspecified side, late effect of cerebrovascular disease    resolved- from L side    Hypertension    Jaundice    resolved following ERCP & Cholecystectomy   Mild emphysema (HCC)    Mitral regurgitation    Mitral valve insufficiency and aortic valve insufficiency    Myocardial infarction ([place])    Paroxysmal atrial fibrillation (HCC)     Pre-diabetes    per spouse   S/P aortic valve replacement with bioprosthetic valve 11/16/2017   23 mm [facility] Ease stented bovine pericardial tissue valve   S/P CABG x 1 11/16/2017   SVG to PDA with [facility] via right thigh   S/P Maze operation for atrial fibrillation 11/16/2017   Left side lesion set using bipolar radiofrequency and cryothermy ablation with clipping of LA appendage   S/P mitral valve replacement with bioprosthetic valve 11/16/2017   27 mm [facility] Mitral stented bovine pericardial tissue valve   Sleep apnea    does not wear CPAP   Sleep concern    resulted in surgery- after + sleep test. Pt. doesn't have a problem any longer.    Stroke ([place]) 03/11/2003   stent placed on the 31, 3, 2004, L side    Wears glasses    Wears hearing aid in both ears    Wears partial dentures    Past Surgical History:  Procedure Laterality Date   AORTIC VALVE REPLACEMENT N/A 11/16/2017   Procedure: AORTIC VALVE REPLACEMENT (AVR) with 75mm Magna Ease;  Surgeon: Rexene Alberts, MD;  Location: [place];  Service: Open Heart Surgery;  Laterality: N/A;   ARTERIAL LINE INSERTION Right 11/16/2017   Procedure: ARTERIAL LINE INSERTION -RIGHT FEMORAL;  Surgeon: Rexene Alberts, MD;  Location: [place];  Service: Open Heart Surgery;  Laterality: Right;   ATRIAL  FIBRILLATION ABLATION N/A 12/16/2021   Procedure: ATRIAL FIBRILLATION ABLATION;  Surgeon: Constance Haw, MD;  Location: [place] CV LAB;  Service: Cardiovascular;  Laterality: N/A;   BACK SURGERY     lumbar back   CARDIOVERSION N/A 04/11/2018   Procedure: CARDIOVERSION;  Surgeon: Larey Dresser, MD;  Location: [facility] ENDOSCOPY;  Service: Cardiovascular;  Laterality: N/A;   CARDIOVERSION N/A 06/18/2021   Procedure: CARDIOVERSION;  Surgeon: Buford Dresser, MD;  Location: [facility] ENDOSCOPY;  Service: Cardiovascular;  Laterality: N/A;   CHOLECYSTECTOMY     CORONARY ARTERY BYPASS GRAFT N/A 11/16/2017   Procedure: CORONARY ARTERY BYPASS GRAFTING  (CABG) x 1;  Surgeon: Rexene Alberts, MD;  Location: [place];  Service: Open Heart Surgery;  Laterality: N/A;   ENDOVEIN HARVEST OF GREATER SAPHENOUS VEIN Right 11/16/2017   Procedure: ENDOVEIN HARVEST OF GREATER SAPHENOUS VEIN;  Surgeon: Rexene Alberts, MD;  Location: [place];  Service: Open Heart Surgery;  Laterality: Right;   ERCP N/A 05/31/2013   Procedure: ENDOSCOPIC RETROGRADE CHOLANGIOPANCREATOGRAPHY (ERCP);  Surgeon: Ladene Artist, MD;  Location: [facility];  Service: Endoscopy;  Laterality: N/A;   FOOT SURGERY     right   IR RADIOLOGY PERIPHERAL GUIDED IV START  09/28/2017   IR US GUIDE VASC ACCESS RIGHT  09/28/2017   LAPAROSCOPIC CHOLECYSTECTOMY SINGLE PORT N/A 06/01/2013   Procedure: LAPAROSCOPIC CHOLECYSTECTOMY SINGLE PORT;  Surgeon: Adin Hector, MD;  Location: [facility];  Service: General;  Laterality: N/A;   MAZE N/A 11/16/2017   Procedure: MAZE;  Surgeon: Rexene Alberts, MD;  Location: [place];  Service: Open Heart Surgery;  Laterality: N/A;   MITRAL VALVE REPAIR N/A 11/16/2017   Procedure: MITRAL VALVE  REPLACEMENT with 33mm MagnaEase;  Surgeon: Rexene Alberts, MD;  Location: [place];  Service: Open Heart Surgery;  Laterality: N/A;   PACEMAKER IMPLANT N/A 11/21/2017   Procedure: PACEMAKER IMPLANT;  Surgeon: Evans Lance, MD;  Location: [place] CV LAB;  Service: Cardiovascular;  Laterality: N/A;   POLYPECTOMY     RIGHT/LEFT HEART CATH AND CORONARY ANGIOGRAPHY N/A 08/30/2017   Procedure: RIGHT/LEFT HEART CATH AND CORONARY ANGIOGRAPHY;  Surgeon: Larey Dresser, MD;  Location: [place] CV LAB;  Service: Cardiovascular;  Laterality: N/A;   SHOULDER ARTHROSCOPY WITH ROTATOR CUFF REPAIR AND SUBACROMIAL DECOMPRESSION Left 05/18/2017   SHOULDER ARTHROSCOPY WITH ROTATOR CUFF REPAIR AND SUBACROMIAL DECOMPRESSION Left 05/18/2017   Procedure: SHOULDER ARTHROSCOPY WITH ROTATOR CUFF REPAIR AND SUBACROMIAL DECOMPRESSION;  Surgeon: Tania Ade, MD;  Location: [place];  Service:  Orthopedics;  Laterality: Left;  LEFT SHOULDER ARTHROSCOPY WITH ROTATOR CUFF REPAIR AND SUBACROMIAL DECOMPRESSION   TEE WITHOUT CARDIOVERSION N/A 05/09/2017   Procedure: TRANSESOPHAGEAL ECHOCARDIOGRAM (TEE);  Surgeon: Skeet Latch, MD;  Location: [place];  Service: Cardiovascular;  Laterality: N/A;   TEE WITHOUT CARDIOVERSION N/A 08/30/2017   Procedure: TRANSESOPHAGEAL ECHOCARDIOGRAM (TEE);  Surgeon: Larey Dresser, MD;  Location: [facility] ENDOSCOPY;  Service: Cardiovascular;  Laterality: N/A;   TEE WITHOUT CARDIOVERSION N/A 11/16/2017   Procedure: TRANSESOPHAGEAL ECHOCARDIOGRAM (TEE);  Surgeon: Rexene Alberts, MD;  Location: [place];  Service: Open Heart Surgery;  Laterality: N/A;   TEE WITHOUT CARDIOVERSION N/A 04/11/2018   Procedure: TRANSESOPHAGEAL ECHOCARDIOGRAM (TEE);  Surgeon: Larey Dresser, MD;  Location: [facility] ENDOSCOPY;  Service: Cardiovascular;  Laterality: N/A;   TEE WITHOUT CARDIOVERSION N/A 06/18/2021   Procedure: TRANSESOPHAGEAL ECHOCARDIOGRAM (TEE);  Surgeon: Buford Dresser, MD;  Location: [place];  Service: Cardiovascular;  Laterality: N/A;   TONSILLECTOMY  Current Outpatient Medications  Medication Sig Dispense Refill   acetaminophen (TYLENOL) 500 MG tablet Take 1,000 mg by mouth every 6 (six) hours as needed (pain.).     amiodarone (PACERONE) 200 MG tablet Take 1 tablet (200 mg total) by mouth daily. 90 tablet 3   Azelastine HCl 137 MCG/SPRAY SOLN Use 1 spray in each nostril 2 times a day 90 mL 3   calcium carbonate (TUMS - DOSED IN MG ELEMENTAL CALCIUM) 500 MG chewable tablet Chew 1 tablet by mouth daily as needed for indigestion or heartburn.     carvedilol (COREG) 6.25 MG tablet Take 1 tablet (6.25 mg total) by mouth 2 (two) times daily. 180 tablet 3   cetirizine (ZYRTEC) 10 MG tablet Take 10 mg by mouth daily as needed for allergies.     citalopram (CELEXA) 20 MG tablet Take 1 tablet (20 mg total) by mouth at bedtime. 30 tablet 5   docusate sodium  (COLACE) 100 MG capsule Take 1 capsule (100 mg total) by mouth 3 (three) times daily as needed. 20 capsule 0   empagliflozin (JARDIANCE) 10 MG TABS tablet TAKE 1 TABLET BY MOUTH ONCE A DAY BEFORE BREAKFAST 90 tablet 3   fluticasone (FLONASE) 50 MCG/ACT nasal spray Place into both nostrils daily as needed for allergies or rhinitis.     furosemide (LASIX) 40 MG tablet Take 1 tablet (40 mg total) by mouth daily. 90 tablet 3   loratadine (CLARITIN) 10 MG tablet Take 10 mg by mouth daily as needed for allergies or rhinitis.     methocarbamol (ROBAXIN) 500 MG tablet Take 1 tablet (500 mg total) by mouth every 8 (eight) hours as needed for muscle spasms. 30 tablet 0   pantoprazole (PROTONIX) 40 MG tablet Take 1 tablet (40 mg total) by mouth daily. 90 tablet 3   Pseudoeph-Doxylamine-DM-APAP (NYQUIL PO) Take 30 mLs by mouth daily as needed (Cold Symptons).     rosuvastatin (CRESTOR) 10 MG tablet TAKE 1 TABLET EVERY DAY 90 tablet 0   sacubitril-valsartan (ENTRESTO) 24-26 MG Take 1 tablet by mouth 2 (two) times daily. 180 tablet 3   spironolactone (ALDACTONE) 25 MG tablet TAKE 1 TABLET EVERY DAY 90 tablet 3   traZODone (DESYREL) 50 MG tablet Take 2 tablets (100 mg total) by mouth at bedtime as needed. 180 tablet 3   warfarin (COUMADIN) 2.5 MG tablet daily. Patient takes 2.5 mg tablet by mouth daily as directed by the coumadin clinic.     No current facility-administered medications for this visit.    Allergies:   Amlodipine, Bee venom, and Bacitracin-polymyxin b   Social History:  The patient  reports that he quit smoking about 5 years ago. His smoking use included cigarettes. He has a 28.50 pack-year smoking history. He has never used smokeless tobacco. He reports current alcohol use of about 14.0 standard drinks per week. He reports that he does not use drugs.   Family History:  The patient's family history includes Angina in his mother; Kidney cancer in his brother; Stroke in his father.   Review of  systems complete and found to be negative unless listed in HPI.    PHYSICAL EXAM: VS:  BP 108/70   Pulse (!) 106   Ht 5\' 11"  (1.803 m)   Wt 267 lb (121.1 kg)   SpO2 96%   BMI 37.24 kg/m  , BMI Body mass index is 37.24 kg/m. General: Pleasant, NAD. No resp difficulty Psych: Normal affect. HEENT:  Normal, without mass or lesion.  Neck: Supple, no bruits or JVD. Carotids 2+. No lymphadenopathy/thyromegaly appreciated. Heart: PMI nondisplaced. RRR no s3, s4, or murmurs. Lungs:  Resp regular and unlabored, CTA. Abdomen: Soft, non-tender, non-distended, No HSM, BS + x 4.   Extremities: No clubbing, cyanosis or edema. DP/PT/Radials 2+ and equal bilaterally. Neuro: Alert and oriented X 3. Moves all extremities spontaneously.   EKG:  EKG is not ordered today. See device interrogation. Personally reviewed and performed.   Recent Labs: 01/18/2022: Hemoglobin 13.3; Platelets 183 04/14/2022: ALT 17; B Natriuretic Peptide 137.1; TSH 3.916 04/22/2022: BUN 33; Creatinine, Ser 2.29; Potassium 4.4; Sodium 138    Lipid Panel     Component Value Date/Time   CHOL 125 10/07/2021 1244   CHOL 120 12/27/2012 0912   TRIG 84 10/07/2021 1244   TRIG 115 12/27/2012 0912   HDL 64 10/07/2021 1244   HDL 40 12/27/2012 0912   CHOLHDL 2.0 10/07/2021 1244   VLDL 17 10/07/2021 1244   LDLCALC 44 10/07/2021 1244   LDLCALC 57 12/27/2012 0912     Wt Readings from Last 3 Encounters:  05/02/22 267 lb (121.1 kg)  04/19/22 268 lb 12.8 oz (121.9 kg)  04/14/22 264 lb 9.6 oz (120 kg)      Other studies Reviewed: Additional studies/ records that were reviewed today include: TEE 06/18/21  Review of the above records today demonstrates:   1. Left ventricular ejection fraction, by estimation, is 50 to 55%. The  left ventricle has low normal function. The left ventricle demonstrates  regional wall motion abnormalities (see scoring diagram/findings for  description). There is hypokinesis of  the left  ventricular, basal-mid septal wall.   2. Right ventricular systolic function is normal. The right ventricular  size is normal.   3. S/P LAA clip, no residual LAA noted. Left atrial size was mildly  dilated. No left atrial/left atrial appendage thrombus was detected.   4. Right atrial size was mildly dilated.   5. The mitral valve has been repaired/replaced. Trivial mitral valve  regurgitation. There is a 27 mm Edwards Magne Bovine pericardial  bioprosthetic valve present in the mitral position. Procedure Date: 2018.  Echo findings are consistent with normal  structure and function of the mitral valve prosthesis.   6. The aortic valve has been repaired/replaced. Aortic valve  regurgitation is not visualized. There is a 23 mm Magna valve present in  the aortic position. Procedure Date: 2018. Echo findings are consistent  with normal structure and function of the  aortic valve prosthesis.   7. There is Moderate (Grade III) plaque involving the descending aorta.    ASSESSMENT AND PLAN:  1.  Persistent atrial fibrillation/atypical atrial flutter:  He has had a surgical maze and left atrial appendage clipping in the past.  CHA2DS2-VASc of 6.  Currently on amiodarone 200 mg daily and warfarin.   High risk medication monitoring for amiodarone. Status post repeat ablation 12/16/2021.   Atrial tachycardia again on presentation today at CL of 570 ms. Had to pace at ~280-300 ms to succeed in breaking his rhythm to AP -VS  Continue amiodarone 200 mg daily  2.  Valvular heart disease: TEE was stable mitral and aortic valve replacements.   Plan per primary cardiology.  3.  Chronic systolic heart failure: Ejection fraction is fortunately improved by Echo TEE 06/2021  (50-55%) Continue Entresto 24/26 mg twice daily, carvedilol 6.25 mg twice daily, Aldactone 25 mg daily, Jardiance 10 mg daily per primary cardiology.  4.  Cryptogenic stroke: Was remote with  mild chronic issues.  Status post carotid  stenting.  5.  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  Status post SVG to the PDA.   Plan per primary cardiology.  6.  Complete heart block: Status post Medtronic dual-chamber pacemaker.   Device function normal No changes today See PaceArt.   Current medicines are reviewed at length with the patient today.   Disposition:   FU with Dr. Curt Bears in 6-8 weeks and to discuss best durable options for his atrial tachycardia. Given his overall tolerance and rates that arent terribly fast, Not sure AV nodal ablation would be an option.   Jacalyn Lefevre, PA-C  05/02/2022 8:59 AM     [facility] [address] [place] [address] [phone] (office) [phone] (fax)

## 2022-04-25 NOTE — Telephone Encounter (Addendum)
Patients wife called to let you know that patient does not want to give self administered injections of the Ozempic and patient also does not want to give up fried foods. She also called to express her frustration and reports that he has not received a shipment of Entresto from [facility]. I resent it into the correct pharmacy.  ?

## 2022-04-29 DIAGNOSIS — M25562 Pain in left knee: Secondary | ICD-10-CM | POA: Diagnosis not present

## 2022-05-02 ENCOUNTER — Ambulatory Visit (INDEPENDENT_AMBULATORY_CARE_PROVIDER_SITE_OTHER): Payer: Medicare HMO | Admitting: Student

## 2022-05-02 ENCOUNTER — Encounter: Payer: Self-pay | Admitting: Student

## 2022-05-02 ENCOUNTER — Other Ambulatory Visit (HOSPITAL_COMMUNITY): Payer: Medicare HMO

## 2022-05-02 ENCOUNTER — Ambulatory Visit (INDEPENDENT_AMBULATORY_CARE_PROVIDER_SITE_OTHER): Payer: Medicare HMO | Admitting: Pharmacist

## 2022-05-02 VITALS — BP 108/70 | HR 106 | Ht 71.0 in | Wt 267.0 lb

## 2022-05-02 DIAGNOSIS — Z953 Presence of xenogenic heart valve: Secondary | ICD-10-CM | POA: Diagnosis not present

## 2022-05-02 DIAGNOSIS — I5022 Chronic systolic (congestive) heart failure: Secondary | ICD-10-CM

## 2022-05-02 DIAGNOSIS — I10 Essential (primary) hypertension: Secondary | ICD-10-CM | POA: Diagnosis not present

## 2022-05-02 DIAGNOSIS — I480 Paroxysmal atrial fibrillation: Secondary | ICD-10-CM

## 2022-05-02 DIAGNOSIS — Z5181 Encounter for therapeutic drug level monitoring: Secondary | ICD-10-CM

## 2022-05-02 DIAGNOSIS — I484 Atypical atrial flutter: Secondary | ICD-10-CM | POA: Diagnosis not present

## 2022-05-02 DIAGNOSIS — Z951 Presence of aortocoronary bypass graft: Secondary | ICD-10-CM

## 2022-05-02 DIAGNOSIS — Z9889 Other specified postprocedural states: Secondary | ICD-10-CM

## 2022-05-02 DIAGNOSIS — Z8679 Personal history of other diseases of the circulatory system: Secondary | ICD-10-CM

## 2022-05-02 LAB — CUP PACEART INCLINIC DEVICE CHECK
Battery Remaining Longevity: 109 mo
Battery Voltage: 3 V
Brady Statistic AP VP Percent: 0.81 %
Brady Statistic AP VS Percent: 18.67 %
Brady Statistic AS VP Percent: 6.77 %
Brady Statistic AS VS Percent: 73.76 %
Brady Statistic RA Percent Paced: 19.69 %
Brady Statistic RV Percent Paced: 7.58 %
Date Time Interrogation Session: 20230522090151
Implantable Lead Implant Date: 20181211
Implantable Lead Implant Date: 20181211
Implantable Lead Location: 753859
Implantable Lead Location: 753860
Implantable Lead Model: 3830
Implantable Lead Model: 5076
Implantable Pulse Generator Implant Date: 20181211
Lead Channel Impedance Value: 304 Ohm
Lead Channel Impedance Value: 342 Ohm
Lead Channel Impedance Value: 399 Ohm
Lead Channel Impedance Value: 418 Ohm
Lead Channel Pacing Threshold Amplitude: 0.625 V
Lead Channel Pacing Threshold Amplitude: 1.5 V
Lead Channel Pacing Threshold Pulse Width: 0.4 ms
Lead Channel Pacing Threshold Pulse Width: 0.4 ms
Lead Channel Sensing Intrinsic Amplitude: 3.625 mV
Lead Channel Sensing Intrinsic Amplitude: 5.125 mV
Lead Channel Sensing Intrinsic Amplitude: 5.5 mV
Lead Channel Sensing Intrinsic Amplitude: 5.625 mV
Lead Channel Setting Pacing Amplitude: 2 V
Lead Channel Setting Pacing Amplitude: 2.5 V
Lead Channel Setting Pacing Pulse Width: 0.7 ms
Lead Channel Setting Sensing Sensitivity: 1.2 mV

## 2022-05-02 LAB — POCT INR: INR: 2.7 (ref 2.0–3.0)

## 2022-05-02 NOTE — Patient Instructions (Signed)
Hold today's dose and then continue taking Warfarin 1 tablet (2.5mg ) daily.   Recheck INR in 4 weeks.  Coumadin Clinic [phone].

## 2022-05-02 NOTE — Patient Instructions (Signed)
Medication Instructions:  Your physician recommends that you continue on your current medications as directed. Please refer to the Current Medication list given to you today.  *If you need a refill on your cardiac medications before your next appointment, please call your pharmacy*   Lab Work: None If you have labs (blood work) drawn today and your tests are completely normal, you will receive your results only by: [place] (if you have MyChart) OR A paper copy in the mail If you have any lab test that is abnormal or we need to change your treatment, we will call you to review the results.   Follow-Up: At [facility], you and your health needs are our priority.  As part of our continuing mission to provide you with exceptional heart care, we have created designated Provider Care Teams.  These Care Teams include your primary Cardiologist (physician) and Advanced Practice Providers (APPs -  Physician Assistants and Nurse Practitioners) who all work together to provide you with the care you need, when you need it.   Your next appointment:   6-8 week(s)  The format for your next appointment:   In Person  Provider:   Allegra Lai, MD{

## 2022-05-03 DIAGNOSIS — I129 Hypertensive chronic kidney disease with stage 1 through stage 4 chronic kidney disease, or unspecified chronic kidney disease: Secondary | ICD-10-CM | POA: Diagnosis not present

## 2022-05-03 DIAGNOSIS — I480 Paroxysmal atrial fibrillation: Secondary | ICD-10-CM | POA: Diagnosis not present

## 2022-05-03 DIAGNOSIS — N183 Chronic kidney disease, stage 3 unspecified: Secondary | ICD-10-CM | POA: Diagnosis not present

## 2022-05-16 ENCOUNTER — Other Ambulatory Visit (HOSPITAL_COMMUNITY): Payer: Self-pay | Admitting: Cardiology

## 2022-05-30 ENCOUNTER — Ambulatory Visit (INDEPENDENT_AMBULATORY_CARE_PROVIDER_SITE_OTHER): Payer: Medicare HMO

## 2022-05-30 DIAGNOSIS — Z951 Presence of aortocoronary bypass graft: Secondary | ICD-10-CM | POA: Diagnosis not present

## 2022-05-30 DIAGNOSIS — Z9889 Other specified postprocedural states: Secondary | ICD-10-CM

## 2022-05-30 DIAGNOSIS — I480 Paroxysmal atrial fibrillation: Secondary | ICD-10-CM

## 2022-05-30 DIAGNOSIS — Z5181 Encounter for therapeutic drug level monitoring: Secondary | ICD-10-CM | POA: Diagnosis not present

## 2022-05-30 DIAGNOSIS — Z953 Presence of xenogenic heart valve: Secondary | ICD-10-CM

## 2022-05-30 LAB — POCT INR: INR: 3 (ref 2.0–3.0)

## 2022-05-30 NOTE — Patient Instructions (Signed)
continue taking Warfarin 1 tablet (2.5mg ) daily.   Recheck INR in 6 weeks.  Coumadin Clinic [phone].

## 2022-06-02 ENCOUNTER — Ambulatory Visit (INDEPENDENT_AMBULATORY_CARE_PROVIDER_SITE_OTHER): Payer: Medicare HMO

## 2022-06-02 DIAGNOSIS — I442 Atrioventricular block, complete: Secondary | ICD-10-CM

## 2022-06-05 LAB — CUP PACEART REMOTE DEVICE CHECK
Battery Remaining Longevity: 107 mo
Battery Voltage: 3.01 V
Brady Statistic AP VP Percent: 0.52 %
Brady Statistic AP VS Percent: 27.78 %
Brady Statistic AS VP Percent: 3.91 %
Brady Statistic AS VS Percent: 67.79 %
Brady Statistic RA Percent Paced: 28.66 %
Brady Statistic RV Percent Paced: 4.43 %
Date Time Interrogation Session: 20230621204354
Implantable Lead Implant Date: 20181211
Implantable Lead Implant Date: 20181211
Implantable Lead Location: 753859
Implantable Lead Location: 753860
Implantable Lead Model: 3830
Implantable Lead Model: 5076
Implantable Pulse Generator Implant Date: 20181211
Lead Channel Impedance Value: 323 Ohm
Lead Channel Impedance Value: 361 Ohm
Lead Channel Impedance Value: 418 Ohm
Lead Channel Impedance Value: 418 Ohm
Lead Channel Pacing Threshold Amplitude: 0.625 V
Lead Channel Pacing Threshold Amplitude: 1.5 V
Lead Channel Pacing Threshold Pulse Width: 0.4 ms
Lead Channel Pacing Threshold Pulse Width: 0.4 ms
Lead Channel Sensing Intrinsic Amplitude: 4 mV
Lead Channel Sensing Intrinsic Amplitude: 4 mV
Lead Channel Sensing Intrinsic Amplitude: 6.75 mV
Lead Channel Sensing Intrinsic Amplitude: 6.75 mV
Lead Channel Setting Pacing Amplitude: 2 V
Lead Channel Setting Pacing Amplitude: 2.5 V
Lead Channel Setting Pacing Pulse Width: 0.7 ms
Lead Channel Setting Sensing Sensitivity: 1.2 mV

## 2022-06-09 NOTE — Progress Notes (Signed)
Remote pacemaker transmission.   

## 2022-06-13 ENCOUNTER — Other Ambulatory Visit (HOSPITAL_COMMUNITY): Payer: Self-pay | Admitting: Cardiology

## 2022-06-13 ENCOUNTER — Other Ambulatory Visit (HOSPITAL_COMMUNITY): Payer: Self-pay

## 2022-06-15 ENCOUNTER — Other Ambulatory Visit (HOSPITAL_COMMUNITY): Payer: Self-pay

## 2022-06-15 MED ORDER — EMPAGLIFLOZIN 10 MG PO TABS
ORAL_TABLET | ORAL | 3 refills | Status: DC
  Filled 2022-06-15: qty 90, 90d supply, fill #0
  Filled 2022-09-22: qty 90, 90d supply, fill #1
  Filled 2022-12-18: qty 90, 90d supply, fill #2
  Filled 2023-03-18: qty 90, 90d supply, fill #3

## 2022-06-20 ENCOUNTER — Encounter: Payer: Self-pay | Admitting: Cardiology

## 2022-06-20 ENCOUNTER — Ambulatory Visit: Payer: Medicare HMO | Admitting: Cardiology

## 2022-06-20 VITALS — BP 112/66 | HR 60 | Ht 71.0 in | Wt 258.6 lb

## 2022-06-20 DIAGNOSIS — I4819 Other persistent atrial fibrillation: Secondary | ICD-10-CM | POA: Diagnosis not present

## 2022-06-20 DIAGNOSIS — I442 Atrioventricular block, complete: Secondary | ICD-10-CM | POA: Diagnosis not present

## 2022-06-20 DIAGNOSIS — D6869 Other thrombophilia: Secondary | ICD-10-CM | POA: Diagnosis not present

## 2022-06-20 NOTE — Progress Notes (Signed)
Electrophysiology Office Note   Date:  06/20/2022   ID:  Brandon [name], DOB 1945/12/21, MRN [id number]  PCP:  [facility]  Cardiologist:  Aundra Dubin Primary Electrophysiologist:  Sable Knoles Meredith Leeds, MD    Chief Complaint: AF   History of Present Illness: Brandon [name] is a 76 y.o. male who is being seen today for the evaluation of AF at the request of Pa, [facility] Presenting today for electrophysiology evaluation.  He has a history significant for CVA, atrial fibrillation, valvular heart disease, diastolic heart failure, coronary artery disease.  He has anomalous RCA and has a vein graft to the PDA.  He was found to have severe mitral regurgitation and moderate aortic stenosis with severe AI.  December 2018 he had a bioprosthetic aortic and mitral valve placed.  He also had a CABG with vein graft to the PDA, surgical maze, left atrial appendage clipping.  Hospital course was complicated with complete heart block and he is now status post Medtronic dual-chamber pacemaker.    He was noted to be in atrial flutter.  He had a TEE guided cardioversion in 2019.  TEE showed an ejection fraction of 25 to 30% with normally functioning valves.  Repeat echo showed improvement in his ejection fraction.  He went back into atrial fibrillation with started on amiodarone.  February 2022 he had a chest CT that showed ILD.  Amiodarone was stopped and he developed recurrent atrial fibrillation.  He is post repeat ablation 12/16/2021.  Today, denies symptoms of palpitations, chest pain, shortness of breath, orthopnea, PND, lower extremity edema, claudication, dizziness, presyncope, syncope, bleeding, or neurologic sequela. The patient is tolerating medications without difficulties.  He is feeling better today.  He continues to have episodes of palpitations with low blood pressures, but these have been less since increasing his dose of amiodarone.  Past Medical History:   Diagnosis Date   Anemia 07/13/2017   Anxiety    Aortic insufficiency    Aortic stenosis, moderate 07/13/2017   Arthritis    back    Carotid stenosis    Right carotid stent (widely patent) 40 - 59% left plaque 11/13   CHF (congestive heart failure) (HCC)    Chronic kidney disease    COPD (chronic obstructive pulmonary disease) ([place])    Coronary artery disease involving native coronary artery of native heart without angina pectoris    Depression    Dyslipidemia    GERD (gastroesophageal reflux disease)    Heart murmur    Hemiplegia affecting unspecified side, late effect of cerebrovascular disease    resolved- from L side    Hypertension    Jaundice    resolved following ERCP & Cholecystectomy   Mild emphysema (HCC)    Mitral regurgitation    Mitral valve insufficiency and aortic valve insufficiency    Myocardial infarction ([place])    Paroxysmal atrial fibrillation (HCC)    Pre-diabetes    per spouse   S/P aortic valve replacement with bioprosthetic valve 11/16/2017   23 mm [facility] Ease stented bovine pericardial tissue valve   S/P CABG x 1 11/16/2017   SVG to PDA with [facility] via right thigh   S/P Maze operation for atrial fibrillation 11/16/2017   Left side lesion set using bipolar radiofrequency and cryothermy ablation with clipping of LA appendage   S/P mitral valve replacement with bioprosthetic valve 11/16/2017   27 mm [facility] Mitral stented bovine pericardial tissue valve   Sleep apnea  does not wear CPAP   Sleep concern    resulted in surgery- after + sleep test. Pt. doesn't have a problem any longer.    Stroke ([place]) 03/11/2003   stent placed on the 31, 3, 2004, L side    Wears glasses    Wears hearing aid in both ears    Wears partial dentures    Past Surgical History:  Procedure Laterality Date   AORTIC VALVE REPLACEMENT N/A 11/16/2017   Procedure: AORTIC VALVE REPLACEMENT (AVR) with 70mm Magna Ease;  Surgeon: Rexene Alberts, MD;  Location: [facility];   Service: Open Heart Surgery;  Laterality: N/A;   ARTERIAL LINE INSERTION Right 11/16/2017   Procedure: ARTERIAL LINE INSERTION -RIGHT FEMORAL;  Surgeon: Rexene Alberts, MD;  Location: [place];  Service: Open Heart Surgery;  Laterality: Right;   ATRIAL FIBRILLATION ABLATION N/A 12/16/2021   Procedure: ATRIAL FIBRILLATION ABLATION;  Surgeon: Constance Haw, MD;  Location: [place] CV LAB;  Service: Cardiovascular;  Laterality: N/A;   BACK SURGERY     lumbar back   CARDIOVERSION N/A 04/11/2018   Procedure: CARDIOVERSION;  Surgeon: Larey Dresser, MD;  Location[facility] ENDOSCOPY;  Service: Cardiovascular;  Laterality: N/A;   CARDIOVERSION N/A 06/18/2021   Procedure: CARDIOVERSION;  Surgeon: Buford Dresser, MD;  Location: [facility] ENDOSCOPY;  Service: Cardiovascular;  Laterality: N/A;   CHOLECYSTECTOMY     CORONARY ARTERY BYPASS GRAFT N/A 11/16/2017   Procedure: CORONARY ARTERY BYPASS GRAFTING (CABG) x 1;  Surgeon: Rexene Alberts, MD;  Location: [place];  Service: Open Heart Surgery;  Laterality: N/A;   ENDOVEIN HARVEST OF GREATER SAPHENOUS VEIN Right 11/16/2017   Procedure: ENDOVEIN HARVEST OF GREATER SAPHENOUS VEIN;  Surgeon: Rexene Alberts, MD;  Location: [place];  Service: Open Heart Surgery;  Laterality: Right;   ERCP N/A 05/31/2013   Procedure: ENDOSCOPIC RETROGRADE CHOLANGIOPANCREATOGRAPHY (ERCP);  Surgeon: Ladene Artist, MD;  Location: [facility];  Service: Endoscopy;  Laterality: N/A;   FOOT SURGERY     right   IR RADIOLOGY PERIPHERAL GUIDED IV START  09/28/2017   IR US GUIDE VASC ACCESS RIGHT  09/28/2017   LAPAROSCOPIC CHOLECYSTECTOMY SINGLE PORT N/A 06/01/2013   Procedure: LAPAROSCOPIC CHOLECYSTECTOMY SINGLE PORT;  Surgeon: Adin Hector, MD;  Location: [facility];  Service: General;  Laterality: N/A;   MAZE N/A 11/16/2017   Procedure: MAZE;  Surgeon: Rexene Alberts, MD;  Location: [place];  Service: Open Heart Surgery;  Laterality: N/A;   MITRAL VALVE REPAIR N/A 11/16/2017    Procedure: MITRAL VALVE  REPLACEMENT with 60mm MagnaEase;  Surgeon: Rexene Alberts, MD;  Location: [place];  Service: Open Heart Surgery;  Laterality: N/A;   PACEMAKER IMPLANT N/A 11/21/2017   Procedure: PACEMAKER IMPLANT;  Surgeon: Evans Lance, MD;  Location: [place] CV LAB;  Service: Cardiovascular;  Laterality: N/A;   POLYPECTOMY     RIGHT/LEFT HEART CATH AND CORONARY ANGIOGRAPHY N/A 08/30/2017   Procedure: RIGHT/LEFT HEART CATH AND CORONARY ANGIOGRAPHY;  Surgeon: Larey Dresser, MD;  Location: [place] CV LAB;  Service: Cardiovascular;  Laterality: N/A;   SHOULDER ARTHROSCOPY WITH ROTATOR CUFF REPAIR AND SUBACROMIAL DECOMPRESSION Left 05/18/2017   SHOULDER ARTHROSCOPY WITH ROTATOR CUFF REPAIR AND SUBACROMIAL DECOMPRESSION Left 05/18/2017   Procedure: SHOULDER ARTHROSCOPY WITH ROTATOR CUFF REPAIR AND SUBACROMIAL DECOMPRESSION;  Surgeon: Tania Ade, MD;  Location: [place];  Service: Orthopedics;  Laterality: Left;  LEFT SHOULDER ARTHROSCOPY WITH ROTATOR CUFF REPAIR AND SUBACROMIAL DECOMPRESSION   TEE WITHOUT  CARDIOVERSION N/A 05/09/2017   Procedure: TRANSESOPHAGEAL ECHOCARDIOGRAM (TEE);  Surgeon: Skeet Latch, MD;  [place];  Service: Cardiovascular;  Laterality: N/A;   TEE WITHOUT CARDIOVERSION N/A 08/30/2017   Procedure: TRANSESOPHAGEAL ECHOCARDIOGRAM (TEE);  Surgeon: Larey Dresser, MD;  Location: [facility] ENDOSCOPY;  Service: Cardiovascular;  Laterality: N/A;   TEE WITHOUT CARDIOVERSION N/A 11/16/2017   Procedure: TRANSESOPHAGEAL ECHOCARDIOGRAM (TEE);  Surgeon: Rexene Alberts, MD;  Location: [place];  Service: Open Heart Surgery;  Laterality: N/A;   TEE WITHOUT CARDIOVERSION N/A 04/11/2018   Procedure: TRANSESOPHAGEAL ECHOCARDIOGRAM (TEE);  Surgeon: Larey Dresser, MD;  Location: [facility] ENDOSCOPY;  Service: Cardiovascular;  Laterality: N/A;   TEE WITHOUT CARDIOVERSION N/A 06/18/2021   Procedure: TRANSESOPHAGEAL ECHOCARDIOGRAM (TEE);  Surgeon: Buford Dresser, MD;   Location: [facility] ENDOSCOPY;  Service: Cardiovascular;  Laterality: N/A;   TONSILLECTOMY       Current Outpatient Medications  Medication Sig Dispense Refill   acetaminophen (TYLENOL) 500 MG tablet Take 1,000 mg by mouth every 6 (six) hours as needed (pain.).     amiodarone (PACERONE) 200 MG tablet Take 1 tablet (200 mg total) by mouth daily. 90 tablet 3   Azelastine HCl 137 MCG/SPRAY SOLN Use 1 spray in each nostril 2 times a day 90 mL 3   calcium carbonate (TUMS - DOSED IN MG ELEMENTAL CALCIUM) 500 MG chewable tablet Chew 1 tablet by mouth daily as needed for indigestion or heartburn.     carvedilol (COREG) 6.25 MG tablet Take 1 tablet (6.25 mg total) by mouth 2 (two) times daily. 180 tablet 3   cetirizine (ZYRTEC) 10 MG tablet Take 10 mg by mouth daily as needed for allergies.     citalopram (CELEXA) 20 MG tablet Take 1 tablet (20 mg total) by mouth at bedtime. 30 tablet 5   docusate sodium (COLACE) 100 MG capsule Take 1 capsule (100 mg total) by mouth 3 (three) times daily as needed. 20 capsule 0   empagliflozin (JARDIANCE) 10 MG TABS tablet TAKE 1 TABLET BY MOUTH ONCE A DAY BEFORE BREAKFAST 90 tablet 3   fluticasone (FLONASE) 50 MCG/ACT nasal spray Place into both nostrils daily as needed for allergies or rhinitis.     furosemide (LASIX) 40 MG tablet Take 1 tablet (40 mg total) by mouth daily. 90 tablet 3   loratadine (CLARITIN) 10 MG tablet Take 10 mg by mouth daily as needed for allergies or rhinitis.     methocarbamol (ROBAXIN) 500 MG tablet Take 1 tablet (500 mg total) by mouth every 8 (eight) hours as needed for muscle spasms. 30 tablet 0   pantoprazole (PROTONIX) 40 MG tablet Take 1 tablet (40 mg total) by mouth daily. 90 tablet 3   Pseudoeph-Doxylamine-DM-APAP (NYQUIL PO) Take 30 mLs by mouth daily as needed (Cold Symptons).     rosuvastatin (CRESTOR) 10 MG tablet TAKE 1 TABLET EVERY DAY 90 tablet 3   sacubitril-valsartan (ENTRESTO) 24-26 MG Take 1 tablet by mouth 2 (two) times daily.  180 tablet 3   spironolactone (ALDACTONE) 25 MG tablet TAKE 1 TABLET EVERY DAY 90 tablet 3   traZODone (DESYREL) 50 MG tablet Take 2 tablets (100 mg total) by mouth at bedtime as needed. 180 tablet 3   warfarin (COUMADIN) 2.5 MG tablet daily. Patient takes 2.5 mg tablet by mouth daily as directed by the coumadin clinic.     No current facility-administered medications for this visit.    Allergies:   Amlodipine, Bee venom, and Bacitracin-polymyxin b   Social History:  The patient  reports that he quit smoking about 5 years ago. His smoking use included cigarettes. He has a 28.50 pack-year smoking history. He has never used smokeless tobacco. He reports current alcohol use of about 14.0 standard drinks of alcohol per week. He reports that he does not use drugs.   Family History:  The patient's family history includes Angina in his mother; Kidney cancer in his brother; Stroke in his father.   ROS:  Please see the history of present illness.   Otherwise, review of systems is positive for none.   All other systems are reviewed and negative.   PHYSICAL EXAM: VS:  BP 112/66   Pulse 60   Ht 5\' 11"  (1.803 m)   Wt 258 lb 9.6 oz (117.3 kg)   SpO2 96%   BMI 36.07 kg/m  , BMI Body mass index is 36.07 kg/m. GEN: Well nourished, well developed, in no acute distress  HEENT: normal  Neck: no JVD, carotid bruits, or masses Cardiac: tachycardic; no murmurs, rubs, or gallops,no edema  Respiratory:  clear to auscultation bilaterally, normal work of breathing GI: soft, nontender, nondistended, + BS MS: no deformity or atrophy  Skin: warm and dry, device site well healed Neuro:  Strength and sensation are intact Psych: euthymic mood, full affect  EKG:  EKG is ordered today. Personal review of the ekg ordered shows atrial tachycardia, rate 102  Personal review of the device interrogation today. Results in [place]: 01/18/2022: Hemoglobin 13.3; Platelets 183 04/14/2022: ALT 17; B  Natriuretic Peptide 137.1; TSH 3.916 04/22/2022: BUN 33; Creatinine, Ser 2.29; Potassium 4.4; Sodium 138    Lipid Panel     Component Value Date/Time   CHOL 125 10/07/2021 1244   CHOL 120 12/27/2012 0912   TRIG 84 10/07/2021 1244   TRIG 115 12/27/2012 0912   HDL 64 10/07/2021 1244   HDL 40 12/27/2012 0912   CHOLHDL 2.0 10/07/2021 1244   VLDL 17 10/07/2021 1244   LDLCALC 44 10/07/2021 1244   LDLCALC 57 12/27/2012 0912     Wt Readings from Last 3 Encounters:  06/20/22 258 lb 9.6 oz (117.3 kg)  05/02/22 267 lb (121.1 kg)  04/19/22 268 lb 12.8 oz (121.9 kg)      Other studies Reviewed: Additional studies/ records that were reviewed today include: TEE 06/18/21  Review of the above records today demonstrates:   1. Left ventricular ejection fraction, by estimation, is 50 to 55%. The  left ventricle has low normal function. The left ventricle demonstrates  regional wall motion abnormalities (see scoring diagram/findings for  description). There is hypokinesis of  the left ventricular, basal-mid septal wall.   2. Right ventricular systolic function is normal. The right ventricular  size is normal.   3. S/P LAA clip, no residual LAA noted. Left atrial size was mildly  dilated. No left atrial/left atrial appendage thrombus was detected.   4. Right atrial size was mildly dilated.   5. The mitral valve has been repaired/replaced. Trivial mitral valve  regurgitation. There is a 27 mm Edwards Magne Bovine pericardial  bioprosthetic valve present in the mitral position. Procedure Date: 2018.  Echo findings are consistent with normal  structure and function of the mitral valve prosthesis.   6. The aortic valve has been repaired/replaced. Aortic valve  regurgitation is not visualized. There is a 23 mm Magna valve present in  the aortic position. Procedure Date: 2018. Echo findings are consistent  with normal structure and function  of the  aortic valve prosthesis.   7. There is Moderate  (Grade III) plaque involving the descending aorta.    ASSESSMENT AND PLAN:  1.  Persistent atrial fibrillation/atypical atrial flutter: Status post surgical maze and left atrial appendage clipping.  CHA2DS2-VASc of 6.  Currently on amiodarone 200 mg daily and warfarin.  High risk medication monitoring for amiodarone.  Status post repeat ablation 12/16/2021.  He was in an atrial tachycardia which converted to sinus rhythm with atrial overdrive pacing.  We Khristine Verno continue his current amiodarone.  He does have potentially ILD, though there is a question of whether or not this is amiodarone induced toxicity.  He has pulmonology follow-up with imaging in January.  2.  Valvular heart disease: TEE stable mitral and aortic valve replacements.  Plan per primary cardiology.  3.  Chronic systolic heart failure: Ejection fraction has improved.  Continue optimal heart failure medications per primary cardiology.  4.  Cryptogenic stroke: Remote with mild issues.  Post carotid stenting.  5.  Coronary artery disease: Anomalous right system on catheterization.  Post SVG to the PDA.  Plan per primary cardiology.  6.  Complete heart block: Status post Medtronic dual-chamber pacemaker.  Device functioning appropriately.  No changes at this time.  7.  Secondary hypercoagulable state: Currently on warfarin for atrial fibrillation as above   Current medicines are reviewed at length with the patient today.   The patient does not have concerns regarding his medicines.  The following changes were made today: none  Labs/ tests ordered today include:  Orders Placed This Encounter  Procedures   EKG 12-Lead      Disposition:   FU 6 months  Signed, Bedford Winsor Meredith Leeds, MD  06/20/2022 3:25 PM     [address] [place] [address] [phone] (office) [phone] (fax)

## 2022-06-20 NOTE — Patient Instructions (Signed)
Medication Instructions:  Your physician recommends that you continue on your current medications as directed. Please refer to the Current Medication list given to you today.  *If you need a refill on your cardiac medications before your next appointment, please call your pharmacy*   Lab Work: None ordered   Testing/Procedures: None ordered   Follow-Up: At [facility], you and your health needs are our priority.  As part of our continuing mission to provide you with exceptional heart care, we have created designated Provider Care Teams.  These Care Teams include your primary Cardiologist (physician) and Advanced Practice Providers (APPs -  Physician Assistants and Nurse Practitioners) who all work together to provide you with the care you need, when you need it.  Your next appointment:   End of   January (after your pulmonologist appointment)  The format for your next appointment:   In Person  Provider:   Legrand Como "Jonni Sanger" Chalmers Cater, PA-C    Thank you for choosing [facility]!!   Trinidad Curet, RN [phone]  Other Instructions   Important Information About Sugar

## 2022-06-22 ENCOUNTER — Other Ambulatory Visit: Payer: Self-pay | Admitting: Cardiology

## 2022-06-23 ENCOUNTER — Encounter: Payer: Medicare HMO | Admitting: Physician Assistant

## 2022-06-23 NOTE — Telephone Encounter (Signed)
Received refill request for warfarin:  Last INR was 3.0 on 05/30/22  Next INR due on 07/11/22 LOV was 06/20/22  W CamnitzMD  Refill approved.

## 2022-06-26 ENCOUNTER — Encounter: Payer: Self-pay | Admitting: Cardiology

## 2022-06-27 MED ORDER — AMIODARONE HCL 200 MG PO TABS: 200.0000 mg | ORAL_TABLET | Freq: Every day | ORAL | 3 refills | Status: DC

## 2022-07-10 DIAGNOSIS — W19XXXA Unspecified fall, initial encounter: Secondary | ICD-10-CM | POA: Diagnosis not present

## 2022-07-10 DIAGNOSIS — R5381 Other malaise: Secondary | ICD-10-CM | POA: Diagnosis not present

## 2022-07-10 DIAGNOSIS — R69 Illness, unspecified: Secondary | ICD-10-CM | POA: Diagnosis not present

## 2022-07-11 ENCOUNTER — Ambulatory Visit (INDEPENDENT_AMBULATORY_CARE_PROVIDER_SITE_OTHER): Payer: Medicare HMO

## 2022-07-11 DIAGNOSIS — Z5181 Encounter for therapeutic drug level monitoring: Secondary | ICD-10-CM

## 2022-07-11 DIAGNOSIS — Z953 Presence of xenogenic heart valve: Secondary | ICD-10-CM

## 2022-07-11 LAB — POCT INR: INR: 3.8 — AB (ref 2.0–3.0)

## 2022-07-11 NOTE — Patient Instructions (Signed)
Description   Hold today's today and then continue taking Warfarin 1 tablet (2.5mg ) daily.   Recheck INR in 6 weeks.  Coumadin Clinic [phone].

## 2022-07-14 ENCOUNTER — Encounter (HOSPITAL_COMMUNITY): Payer: Medicare HMO

## 2022-07-25 ENCOUNTER — Ambulatory Visit (HOSPITAL_COMMUNITY)
Admission: RE | Admit: 2022-07-25 | Discharge: 2022-07-25 | Disposition: A | Discharge status: Home / Self Care | Payer: Medicare HMO | Source: Ambulatory Visit | Attending: Internal Medicine | Admitting: Internal Medicine

## 2022-07-25 ENCOUNTER — Encounter (HOSPITAL_COMMUNITY): Payer: Self-pay

## 2022-07-25 VITALS — BP 102/74 | HR 96 | Wt 252.6 lb

## 2022-07-25 DIAGNOSIS — D509 Iron deficiency anemia, unspecified: Secondary | ICD-10-CM | POA: Diagnosis not present

## 2022-07-25 DIAGNOSIS — I5042 Chronic combined systolic (congestive) and diastolic (congestive) heart failure: Secondary | ICD-10-CM | POA: Diagnosis not present

## 2022-07-25 DIAGNOSIS — I480 Paroxysmal atrial fibrillation: Secondary | ICD-10-CM | POA: Diagnosis not present

## 2022-07-25 DIAGNOSIS — J449 Chronic obstructive pulmonary disease, unspecified: Secondary | ICD-10-CM | POA: Insufficient documentation

## 2022-07-25 DIAGNOSIS — Z8673 Personal history of transient ischemic attack (TIA), and cerebral infarction without residual deficits: Secondary | ICD-10-CM | POA: Diagnosis not present

## 2022-07-25 DIAGNOSIS — I352 Nonrheumatic aortic (valve) stenosis with insufficiency: Secondary | ICD-10-CM | POA: Diagnosis not present

## 2022-07-25 DIAGNOSIS — E785 Hyperlipidemia, unspecified: Secondary | ICD-10-CM

## 2022-07-25 DIAGNOSIS — Z6835 Body mass index (BMI) 35.0-35.9, adult: Secondary | ICD-10-CM | POA: Insufficient documentation

## 2022-07-25 DIAGNOSIS — Z79899 Other long term (current) drug therapy: Secondary | ICD-10-CM | POA: Diagnosis not present

## 2022-07-25 DIAGNOSIS — I130 Hypertensive heart and chronic kidney disease with heart failure and stage 1 through stage 4 chronic kidney disease, or unspecified chronic kidney disease: Secondary | ICD-10-CM | POA: Diagnosis not present

## 2022-07-25 DIAGNOSIS — I10 Essential (primary) hypertension: Secondary | ICD-10-CM

## 2022-07-25 DIAGNOSIS — G4733 Obstructive sleep apnea (adult) (pediatric): Secondary | ICD-10-CM | POA: Diagnosis not present

## 2022-07-25 DIAGNOSIS — Z7984 Long term (current) use of oral hypoglycemic drugs: Secondary | ICD-10-CM | POA: Insufficient documentation

## 2022-07-25 DIAGNOSIS — Z953 Presence of xenogenic heart valve: Secondary | ICD-10-CM

## 2022-07-25 DIAGNOSIS — J849 Interstitial pulmonary disease, unspecified: Secondary | ICD-10-CM

## 2022-07-25 DIAGNOSIS — Z823 Family history of stroke: Secondary | ICD-10-CM | POA: Insufficient documentation

## 2022-07-25 DIAGNOSIS — Z87891 Personal history of nicotine dependence: Secondary | ICD-10-CM | POA: Diagnosis not present

## 2022-07-25 DIAGNOSIS — I358 Other nonrheumatic aortic valve disorders: Secondary | ICD-10-CM | POA: Diagnosis not present

## 2022-07-25 DIAGNOSIS — I2510 Atherosclerotic heart disease of native coronary artery without angina pectoris: Secondary | ICD-10-CM | POA: Diagnosis not present

## 2022-07-25 DIAGNOSIS — I4892 Unspecified atrial flutter: Secondary | ICD-10-CM | POA: Insufficient documentation

## 2022-07-25 DIAGNOSIS — Z7901 Long term (current) use of anticoagulants: Secondary | ICD-10-CM | POA: Insufficient documentation

## 2022-07-25 DIAGNOSIS — Z0100 Encounter for examination of eyes and vision without abnormal findings: Secondary | ICD-10-CM | POA: Diagnosis not present

## 2022-07-25 DIAGNOSIS — E669 Obesity, unspecified: Secondary | ICD-10-CM | POA: Insufficient documentation

## 2022-07-25 DIAGNOSIS — I442 Atrioventricular block, complete: Secondary | ICD-10-CM

## 2022-07-25 DIAGNOSIS — J441 Chronic obstructive pulmonary disease with (acute) exacerbation: Secondary | ICD-10-CM | POA: Diagnosis not present

## 2022-07-25 DIAGNOSIS — I471 Supraventricular tachycardia: Secondary | ICD-10-CM | POA: Insufficient documentation

## 2022-07-25 DIAGNOSIS — N183 Chronic kidney disease, stage 3 unspecified: Secondary | ICD-10-CM | POA: Diagnosis not present

## 2022-07-25 DIAGNOSIS — I5022 Chronic systolic (congestive) heart failure: Secondary | ICD-10-CM

## 2022-07-25 LAB — BASIC METABOLIC PANEL
Anion gap: 9 (ref 5–15)
BUN: 29 mg/dL — ABNORMAL HIGH (ref 8–23)
CO2: 21 mmol/L — ABNORMAL LOW (ref 22–32)
Calcium: 9 mg/dL (ref 8.9–10.3)
Chloride: 107 mmol/L (ref 98–111)
Creatinine, Ser: 2.37 mg/dL — ABNORMAL HIGH (ref 0.61–1.24)
GFR, Estimated: 28 mL/min — ABNORMAL LOW (ref >60)
Glucose, Bld: 117 mg/dL — ABNORMAL HIGH (ref 70–99)
Potassium: 4.3 mmol/L (ref 3.5–5.1)
Sodium: 137 mmol/L (ref 135–145)

## 2022-07-25 NOTE — Patient Instructions (Addendum)
Labs done today. We will contact you only if your labs are abnormal.  No medication changes were made. Please continue all current medications as prescribed.  Your physician recommends that you schedule a follow-up appointment in: 4 months with Dr. Aundra Dubin. Please contact our office in November to schedule a December appointment.   If you have any questions or concerns before your next appointment please send [place] a message through [place] or call our office at [phone].    TO LEAVE A MESSAGE FOR THE NURSE SELECT OPTION 2, PLEASE LEAVE A MESSAGE INCLUDING: YOUR NAME DATE OF BIRTH CALL BACK NUMBER REASON FOR CALL**this is important as we prioritize the call backs  YOU WILL RECEIVE A CALL BACK THE SAME DAY AS LONG AS YOU CALL BEFORE 4:00 PM   Do the following things EVERYDAY: Weigh yourself in the morning before breakfast. Write it down and keep it in a log. Take your medicines as prescribed Eat low salt foods--Limit salt (sodium) to 2000 mg per day.  Stay as active as you can everyday Limit all fluids for the day to less than 2 liters   At the [facility], you and your health needs are our priority. As part of our continuing mission to provide you with exceptional heart care, we have created designated Provider Care Teams. These Care Teams include your primary Cardiologist (physician) and Advanced Practice Providers (APPs- Physician Assistants and Nurse Practitioners) who all work together to provide you with the care you need, when you need it.   You may see any of the following providers on your designated Care Team at your next follow up: Dr Glori Bickers Dr Haynes Kerns, NP Lyda Jester, [place] Audry Riles, PharmD   Please be sure to bring in all your medications bottles to every appointment.

## 2022-07-25 NOTE — Progress Notes (Signed)
Advanced Heart Failure Clinic Note   PCP: Dr. Justin Mend Cardiology: Dr. Radford Pax HF Cardiology: Dr. Aundra Dubin  76 y.o.with history of CVA, paroxysmal atrial fibrillation, valvular heart disease, and chronic diastolic CHF presents for evaluation of CHF and valvular heart disease.    He had had episodes of dyspnea and initial workup led to a TEE in 5/18 showing normal EF with moderate AS and moderate MR.  He continued to have episodic dyspnea and ended up admitted in 8/18 with shortness of breath and chest pressure.  This led to a long, complicated hospitalization.  He was noted to be volume overloaded with acute diastolic CHF and was also noted to have symptomatic runs of atrial fibrillation with RVR.  Amiodarone was started to control the atrial fibrillation.  He developed respiratory distress => Bipap => intubated.  He became hypotensive, requiring pressors.  He developed AKI.  PNA was noted and he received broad spectrum abx.  He had a prolonged intubation but was eventually extubated.  Given deconditioning from long hospitalization, he went to CIR for a couple of weeks.  LHC in 9/18 showed occluded PDA with left>right collaterals.  TEE in 9/18 showed EF 50%, severe MR (possibly infarct-related with restricted posterior leaflet, and moderate AS + moderate-severe AI (possibly rheumatic).   In 12/18, he had cardiac surgery with bioprosthetic aortic and mitral valves placed.  He had SVG-PDA, Maze, and LA appendage clipping.  Post-op course was complicated by CHB requiring Medtronic dual chamber PPM (His bundle lead placed but captured right bundle so induced LBBB).   Echo 01/02/18 LVEF 30-35%, bioprosthetic MV and AoV function normally, RV mildly dilated with normal systolic function.    He was noted to be in atrial flutter with mild RVR and had TEE-guided DCCV on 04/11/18. TEE showed EF 25-30%, normal bioprosthetic aortic and mitral valves.  Echo in 6/19 showed EF 30-35%, normal bioprosthetic aortic and mitral  valves.  Echo 12/19 with EF up to 50-55%, normal bioprosthetic mitral and aortic valves, normal RV.   In 2020, he went back into atrial fibrillation.  Amiodarone was started, and he returned to NSR.   Echo in 11/21 showed EF 50-55%, mild LVH, mildly dilated RV with normal function, bioprosthetic mitral valve with mean gradient 5 mmHg and no MR, bioprosthetic aortic valve with mean gradient 12 and no AI.  PFTs in 1/22 were restrictive.  High resolution CT chest was done in 2/22, concerning for ILD, usual interstitial pneumonitis.  He saw pulmonary, ESR was mildly elevated.  We decided to try him off amiodarone given concern for possible amiodarone-induced lung toxicity.  He then developed atrial fibrillation off amiodarone, was quite symptomatic.  He underwent TEE-guided DCCV back to NSR in 7/22 and amiodarone was restarted.  TEE showed EF 50-55%, mid septal HK, RV normal, bioprosthetic aortic and mitral valves appeared normal.   EP tried decreasing amiodarone to 100 mg daily.  However, patient then developed runs of atrial tachycardia (had to be paced out).  Amiodarone was increased back to 200 mg daily.   Today he returns for HF follow up. Overall feeling fine. He is SOB with walking further distances on flat ground. Had a couple falls at home recently, left knee gives out due to OA. Walks with a cane and has a walker at home but does not want to use. Denies palpitations, abnormal bleeding, CP, dizziness, edema, or PND/Orthopnea. Appetite ok. No fever or chills. Weight at home 250 pounds. Taking all medications.   ECG (personally reviewed):  none ordered today.  Device interrogation (personally reviewed): no AF/AT, < 0.1 hr day/activity, 6.4% v-pacing  Labs (9/18): K 4.5, creatinine 1.42, LDL 90, HDL 36, LFTs normal, TSH normal, hgb 9.6 Labs (10/18): K 4.3, creatinine 1.45 Labs (12/18): K 3.8, creatinine 1.56 Labs (1/19): creatinine 1.68, LDL 66 Labs (3/19): K 4.2, creatinine 1.56 Labs (4/19): K  4.4, creatinine 1.71, hgb 11.8, LDL 75, HDL 43 Labs (6/19): K 4.8, creatinine 2.1, LDL 75, TGs 246 Labs (8/19): K 4.6, creatinine 2.07 Labs (11/19): K 4.6, creatinine 1.29 Labs (2/20): K 4.8, creatinine 1.98 Labs (5/20): LDL 49, HDL 39 Labs (7/20): TSH normal Labs (9/20): TSH normal, K 4.4, creatinine 2.03, hgb 13.5, LFTs normal Labs (12/20): K 4.4, creatinine 1.87, hgb 13.1.  Labs (11/21): LDL 54, K 4.6, creatinine 2.22, LFTs normal, TSH normal Labs (7/22): TSH normal, K 4.5, creatinine 2.98, hgb 13.7 Labs (8/22): K 4.4, creatinine 2.23 Labs (2/23): BNP 299 Labs (3/23): K 3.9, creatinine 2.21 Labs (5/23): K 4.4, creatinine 2.29  PMH: 1. CVA: Right MCA, 2004.  Minimal residual problems.  2. HTN - Peripheral edema with amlodipine.  3. Hyperlipidemia 4. Left rotator cuff surgery 6/18 5. Carotid stenosis: s/p right carotid stent.  6. GERD 7. H/o CCY 8. Anemia: FOBT negative.  9. Gout  10. Atrial fibrillation: Paroxysmal. Maze in 12/18 with LAA clipping.  - Recurrent atrial fibrillation 2020 => amiodarone.  - Recurrent atrial fibrillation off amiodarone in 7/22, DCCV to NSR and amiodarone restarted.  11. Valvular heart disease: TEE (5/18) with EF 55-60%, moderate AS mean 33 mmHg, moderate MR, normal RV size and systolic function.  - Echo (8/18): EF 55-60%, moderate LVH, moderate AS with mean gradient 33 mmHg, moderate AI, moderate to severe MR.  - TEE (9/18): EF 50%, mild LV dilation, suspect severe MR with posterior leaflet restricted (?infarct-related MR), ERO 0.4 cm^2, moderate AS/moderate-severe AI (possibly rheumatic).   - In 12/18, he had cardiac surgery with bioprosthetic aortic and mitral valves placed.  He had SVG-PDA, Maze, and LA appendage clipping. - Echo (1/19): LVEF 30-35%, bioprosthetic MV and AoV function normally, RV mildly dilated with normal systolic function.  - TEE (5/19) with EF 25-30%, normal bioprosthetic aortic valve, normal bioprosthetic mitral valve. - Echo  (6/19): EF 30-35%, mild LV dilation with diffuse hypokinesis, normal RV size with mildly decreased systolic function, normal-appearing bioprosthetic mitral and aortic valves.   - Echo (12/19): EF 50-55%, moderate LVH, bioprosthetic aortic valve with mean gradient 10 mmHg, normally-functioning bioprosthetic mitral valve.  - Echo (11/21): EF 50-55%, mild LVH, mildly dilated RV with normal function, bioprosthetic mitral valve with mean gradient 5 mmHg and no MR, bioprosthetic aortic valve with mean gradient 12 and no AI.   - TEE (7/22): EF 50-55%, septal HK, normal RV size/systolic function, LA appendage clipped, bioprosthetic aortic and mitral valves appeared normal.  12. Chronic systolic CHF 13. Deafness 14. CKD: stage 3.  15. 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  17. COPD: Mild obstruction on 9/18 PFTs.  - PFTs (10/20): Mild obstruction, decreased DLCO.  18. Post-op CHB in 12/18 with Medtronic dual chamber PPM.  19. Chronic systolic CHF: Echo (2/95) with EF down to 30-35% post-op.  - TEE (5/19) with EF 25-30%, normal bioprosthetic aortic valve, normal bioprosthetic mitral valve.  - Echo (12/19): EF 50-55%, moderate LVH, bioprosthetic aortic valve with mean gradient  10 mmHg, normally-functioning bioprosthetic mitral valve.  20. Atrial flutter: DCCV in 5/19.  21. OSA: Not using CPAP.  22. Interstitial lung disease: PFTs in 1/22 concerning for restriction.  High resolution chest CT in 2/22 with possible UIP.  23. Atrial tachycardia: Pace-terminated.   SH: Quit smoking 8/18.  Married, 2 children, retired.    Family History  Problem Relation Age of Onset   Stroke Father        No details   Angina Mother    Kidney cancer Brother    Review of systems complete and found to be negative unless listed in HPI.    Current Outpatient Medications   Medication Sig Dispense Refill   acetaminophen (TYLENOL) 500 MG tablet Take 1,000 mg by mouth every 6 (six) hours as needed (pain.).     amiodarone (PACERONE) 200 MG tablet Take 1 tablet (200 mg total) by mouth daily. 90 tablet 3   Azelastine HCl 137 MCG/SPRAY SOLN Use 1 spray in each nostril 2 times a day 90 mL 3   calcium carbonate (TUMS - DOSED IN MG ELEMENTAL CALCIUM) 500 MG chewable tablet Chew 1 tablet by mouth daily as needed for indigestion or heartburn.     carvedilol (COREG) 6.25 MG tablet Take 1 tablet (6.25 mg total) by mouth 2 (two) times daily. 180 tablet 3   cetirizine (ZYRTEC) 10 MG tablet Take 10 mg by mouth daily as needed for allergies.     citalopram (CELEXA) 20 MG tablet Take 1 tablet (20 mg total) by mouth at bedtime. 30 tablet 5   docusate sodium (COLACE) 100 MG capsule Take 1 capsule (100 mg total) by mouth 3 (three) times daily as needed. 20 capsule 0   empagliflozin (JARDIANCE) 10 MG TABS tablet TAKE 1 TABLET BY MOUTH ONCE A DAY BEFORE BREAKFAST 90 tablet 3   fluticasone (FLONASE) 50 MCG/ACT nasal spray Place into both nostrils daily as needed for allergies or rhinitis.     furosemide (LASIX) 40 MG tablet Take 1 tablet (40 mg total) by mouth daily. 90 tablet 3   loratadine (CLARITIN) 10 MG tablet Take 10 mg by mouth daily as needed for allergies or rhinitis.     methocarbamol (ROBAXIN) 500 MG tablet Take 1 tablet (500 mg total) by mouth every 8 (eight) hours as needed for muscle spasms. 30 tablet 0   pantoprazole (PROTONIX) 40 MG tablet Take 1 tablet (40 mg total) by mouth daily. 90 tablet 3   Pseudoeph-Doxylamine-DM-APAP (NYQUIL PO) Take 30 mLs by mouth daily as needed (Cold Symptons).     rosuvastatin (CRESTOR) 10 MG tablet TAKE 1 TABLET EVERY DAY 90 tablet 3   sacubitril-valsartan (ENTRESTO) 24-26 MG Take 1 tablet by mouth 2 (two) times daily. 180 tablet 3   spironolactone (ALDACTONE) 25 MG tablet TAKE 1 TABLET EVERY DAY 90 tablet 3   traZODone (DESYREL) 50 MG tablet  Take 50 mg by mouth at bedtime.     warfarin (COUMADIN) 2.5 MG tablet TAKE 1/2 TO 1 TABLET ONE TIME DAILY AS DIRECTED BY COUMADIN CLINIC . DOSE CHANGE 90 tablet 1   No current facility-administered medications for this encounter.   BP 102/74   Pulse 96   Wt 114.6 kg (252 lb 9.6 oz)   SpO2 94%   BMI 35.23 kg/m   Wt Readings from Last 3 Encounters:  07/25/22 114.6 kg (252 lb 9.6 oz)  06/20/22 117.3 kg (258 lb 9.6 oz)  05/02/22 121.1 kg (267 lb)   Physical Exam: General:  NAD. No resp difficulty, walked into clinic with cane. HEENT: Normal Neck: Supple. No JVD. Carotids 2+ bilat; no bruits. No lymphadenopathy or thryomegaly appreciated. Cor: PMI nondisplaced. Regular rate & rhythm. No rubs, gallops, 2/6 SEM RUSB Lungs: Clear Abdomen: Obese, nontender, nondistended. No hepatosplenomegaly. No bruits or masses. Good bowel sounds. Extremities: No cyanosis, clubbing, rash, edema Neuro: Alert & oriented x 3, cranial nerves grossly intact. Moves all 4 extremities w/o difficulty. Affect pleasant.  Assessment/Plan: 1. Valvular heart disease: TEE in 9/18 showed severe MR, probably infarct-related with restrictive posterior mitral leaflet. There was moderate aortic stenosis and moderate-severe aortic insufficiency, possible rheumatic.  In 12/18, he had bioprosthetic MVR and AVR. Valves stable on TEE in 7/22. - Antibiotic prophylaxis needed with dental work.  2. Chronic systolic => diastolic CHF: In setting of significant valvular disease as above. EF down to 30-35% post-op (1/19 echo), likely reflects true LV systolic function without the volume load from severe MR and moderate-severe AI.  TEE in 5/19 showed EF 25-30%.  Repeat echo in 6/19 with EF 30-35%. Repeat echo in 12/19 showed EF up to 50-55%, echo in 11/21 again showed EF 50-55% with normal RV systolic function.  TEE in 7/22 showed EF 50-55% with normal RV.  He is not volume overloaded on exam.  NYHA class II symptoms, not volume overloaded on  exam. GDMT complicated by CKD stage 3.   - Continue Entresto 24/26 bid, BMET/BNP today.  - Continue Lasix 40 mg daily.     - Continue Coreg 6.25 mg bid.   - Continue spironolactone 25 mg daily. - Continue Jardiance 10 mg daily.  3. CKD: Stage 3.  - As above, continue Jardiance.  - BMET today.  4. Atrial fibrillation/flutter: Paroxysmal. He has had a Maze procedure.  He tends to tolerate atrial arrhythmias poorly.  He had atrial flutter with DCCV in 5/19.  Recurrent atrial fibrillation in 2020.  Stopped amiodarone and went into atrial fibrillation in 6/22, very symptomatic.  Amiodarone restarted and he was cardioverted to NSR in 7/22.   - He is on warfarin. INR followed by coumadin clinic. - Continue amiodarone 200 mg daily for now.  LFTs and TSH ok 5/23.  He will need an eye exam, discussed today.  CT chest in past showed possible early ILD, ESR was only mildly elevated.  He saw pulmonary, not definite amiodarone-induced lung toxicity but need to follow closely.  He will need PFTs and followup again with Dr. Vaughan Browner in 11/23. Would prefer him off amiodarone, but he developed atrial tachycardia with RVR when we decreased the dose.  5. COPD: Mild obstruction on 10/20 PFTs, decreased DLCO.  He has quit smoking.   - Repeat PFTs in 11/23 as above.  - Follows with pulmonary.  6. CVA: Remote, minimal residual. S/p carotid stenting.  - No ASA given warfarin use.  7. Hyperlipidemia: Continue Crestor.   - Check lipids next visit.   8. Anemia: Fe deficiency.   9. 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  The artery to the PDA was occluded with left to right collaterals.  He had SVG-PDA in 12/18. No chest pain.  - Continue statin.      - No ASA given warfarin use.  10. CHB: has Medtronic PPM.  He a-paces a high percentage of the time but there is minimal RV pacing.  11. OSA:  Severe OSA, he does  not want to use CPAP.  12. Obesity: Body mass index is 35.23 kg/m. - He decided against Ozempic for weight loss.  - Down 15 lbs with diet and exercise. 13. HTN: BP controlled.  14. Interstitial lung disease: Restrictive PFTs in 1/22.  High resolution CT chest concerning for ILD, possibly usual interstitial pneumonitis.  He saw pulmonary, could not rule out amiodarone lung toxicity so tried him off amiodarone.  He quickly went back into atrial fibrillation that was tolerated poorly, so amiodarone restarted.  We again tried to wean amiodarone but he developed atrial tachycardia with RVR.  Now back on amiodarone 200 mg daily.  - Will need to follow PFTs, repeat in 11/23.  - Continue to followup with pulmonary.   Followup in 3-4 months with Dr. Aundra Dubin.  Maricela Bo [facility] FNP-BC 07/25/2022

## 2022-08-02 ENCOUNTER — Telehealth: Payer: Self-pay | Admitting: Pulmonary Disease

## 2022-08-02 NOTE — Telephone Encounter (Signed)
Called and spoke with pt's spouse Katharine Look stating to her that I did not see at last OV that Dr. Vaughan Browner mentioned for pt to have PFT performed. Stated to her that he had said to repeat CT in 1 year. Katharine Look stated that Dr. Claris Gladden office was mentioning to her about pt's PFT and she wanted to know if pt's last one that was done was done at our office or at the hospital. Stated to her that it was done at the hospital as it was ordered by Dr. Aundra Dubin and she verbalized understanding. Nothing further needed.

## 2022-08-09 ENCOUNTER — Encounter: Payer: Self-pay | Admitting: Internal Medicine

## 2022-08-12 DIAGNOSIS — E782 Mixed hyperlipidemia: Secondary | ICD-10-CM | POA: Diagnosis not present

## 2022-08-12 DIAGNOSIS — I10 Essential (primary) hypertension: Secondary | ICD-10-CM | POA: Diagnosis not present

## 2022-08-12 DIAGNOSIS — Z0000 Encounter for general adult medical examination without abnormal findings: Secondary | ICD-10-CM | POA: Diagnosis not present

## 2022-08-12 DIAGNOSIS — I480 Paroxysmal atrial fibrillation: Secondary | ICD-10-CM | POA: Diagnosis not present

## 2022-08-12 DIAGNOSIS — E1122 Type 2 diabetes mellitus with diabetic chronic kidney disease: Secondary | ICD-10-CM | POA: Diagnosis not present

## 2022-08-12 DIAGNOSIS — N1832 Chronic kidney disease, stage 3b: Secondary | ICD-10-CM | POA: Diagnosis not present

## 2022-08-12 DIAGNOSIS — I2510 Atherosclerotic heart disease of native coronary artery without angina pectoris: Secondary | ICD-10-CM | POA: Diagnosis not present

## 2022-08-12 DIAGNOSIS — Z1159 Encounter for screening for other viral diseases: Secondary | ICD-10-CM | POA: Diagnosis not present

## 2022-08-12 DIAGNOSIS — I5032 Chronic diastolic (congestive) heart failure: Secondary | ICD-10-CM | POA: Diagnosis not present

## 2022-08-12 DIAGNOSIS — R7303 Prediabetes: Secondary | ICD-10-CM | POA: Diagnosis not present

## 2022-08-17 DIAGNOSIS — Z1211 Encounter for screening for malignant neoplasm of colon: Secondary | ICD-10-CM | POA: Diagnosis not present

## 2022-08-17 DIAGNOSIS — Z0000 Encounter for general adult medical examination without abnormal findings: Secondary | ICD-10-CM | POA: Diagnosis not present

## 2022-08-18 ENCOUNTER — Other Ambulatory Visit (HOSPITAL_COMMUNITY): Payer: Self-pay | Admitting: Internal Medicine

## 2022-08-19 ENCOUNTER — Ambulatory Visit: Payer: Medicare HMO | Admitting: Pulmonary Disease

## 2022-08-22 ENCOUNTER — Ambulatory Visit: Payer: Medicare HMO | Attending: Internal Medicine | Admitting: *Deleted

## 2022-08-22 DIAGNOSIS — Z5181 Encounter for therapeutic drug level monitoring: Secondary | ICD-10-CM

## 2022-08-22 DIAGNOSIS — Z953 Presence of xenogenic heart valve: Secondary | ICD-10-CM | POA: Diagnosis not present

## 2022-08-22 LAB — POCT INR: INR: 3.8 — AB (ref 2.0–3.0)

## 2022-08-22 NOTE — Patient Instructions (Signed)
Description   Hold today's dose of warfarin then START taking warfarin 1 tablet daily except for 1/2 a tablet on Mondays. Recheck INR in  3 weeks.  Coumadin Clinic [phone].

## 2022-08-25 ENCOUNTER — Ambulatory Visit: Payer: Medicare HMO | Admitting: Podiatry

## 2022-08-25 DIAGNOSIS — M79675 Pain in left toe(s): Secondary | ICD-10-CM | POA: Diagnosis not present

## 2022-08-25 DIAGNOSIS — B351 Tinea unguium: Secondary | ICD-10-CM | POA: Diagnosis not present

## 2022-08-25 DIAGNOSIS — Z7901 Long term (current) use of anticoagulants: Secondary | ICD-10-CM

## 2022-08-25 DIAGNOSIS — M79674 Pain in right toe(s): Secondary | ICD-10-CM | POA: Diagnosis not present

## 2022-08-25 DIAGNOSIS — M159 Polyosteoarthritis, unspecified: Secondary | ICD-10-CM

## 2022-08-29 NOTE — Progress Notes (Signed)
  Subjective:  Patient ID: Brandon [name], male    DOB: [birth year],  MRN: [id number]  Chief Complaint  Patient presents with   Diabetes    Diabetic foot exam, A1C 6.6   Nail Problem    Thick painful toenails    76 y.o. male presents with the above complaint. History confirmed with patient.  Nails are thickened and elongated and causing problems  Objective:  Physical Exam: warm, good capillary refill, no trophic changes or ulcerative lesions, normal DP and PT pulses, and normal sensory exam. Left Foot: dystrophic yellowed discolored nail plates with subungual debris Right Foot: dystrophic yellowed discolored nail plates with subungual debris   Assessment:   1. Pain due to onychomycosis of toenails of both feet   2. Chronic anticoagulation   3. Generalized osteoarthritis      Plan:  Patient was evaluated and treated and all questions answered.   Discussed the etiology and treatment options for the condition in detail with the patient. Educated patient on the topical and oral treatment options for mycotic nails. Recommended debridement of the nails today. Sharp and mechanical debridement performed of all painful and mycotic nails today. Nails debrided in length and thickness using a nail nipper to level of comfort. Discussed treatment options including appropriate shoe gear. Follow up as needed for painful nails.    Return in about 3 months (around 11/24/2022) for at risk diabetic foot care.

## 2022-09-01 ENCOUNTER — Ambulatory Visit (INDEPENDENT_AMBULATORY_CARE_PROVIDER_SITE_OTHER): Payer: Medicare HMO

## 2022-09-01 DIAGNOSIS — I442 Atrioventricular block, complete: Secondary | ICD-10-CM | POA: Diagnosis not present

## 2022-09-01 LAB — CUP PACEART REMOTE DEVICE CHECK
Battery Remaining Longevity: 104 mo
Battery Voltage: 3 V
Brady Statistic AP VP Percent: 0.8 %
Brady Statistic AP VS Percent: 10.06 %
Brady Statistic AS VP Percent: 6.84 %
Brady Statistic AS VS Percent: 82.3 %
Brady Statistic RA Percent Paced: 10.97 %
Brady Statistic RV Percent Paced: 7.65 %
Date Time Interrogation Session: 20230920215432
Implantable Lead Implant Date: 20181211
Implantable Lead Implant Date: 20181211
Implantable Lead Location: 753859
Implantable Lead Location: 753860
Implantable Lead Model: 3830
Implantable Lead Model: 5076
Implantable Pulse Generator Implant Date: 20181211
Lead Channel Impedance Value: 285 Ohm
Lead Channel Impedance Value: 323 Ohm
Lead Channel Impedance Value: 380 Ohm
Lead Channel Impedance Value: 399 Ohm
Lead Channel Pacing Threshold Amplitude: 0.625 V
Lead Channel Pacing Threshold Amplitude: 1.5 V
Lead Channel Pacing Threshold Pulse Width: 0.4 ms
Lead Channel Pacing Threshold Pulse Width: 0.4 ms
Lead Channel Sensing Intrinsic Amplitude: 2.75 mV
Lead Channel Sensing Intrinsic Amplitude: 2.75 mV
Lead Channel Sensing Intrinsic Amplitude: 4.375 mV
Lead Channel Sensing Intrinsic Amplitude: 4.375 mV
Lead Channel Setting Pacing Amplitude: 2 V
Lead Channel Setting Pacing Amplitude: 2.5 V
Lead Channel Setting Pacing Pulse Width: 0.7 ms
Lead Channel Setting Sensing Sensitivity: 1.2 mV

## 2022-09-10 ENCOUNTER — Other Ambulatory Visit (HOSPITAL_COMMUNITY): Payer: Self-pay | Admitting: Cardiology

## 2022-09-12 ENCOUNTER — Ambulatory Visit: Payer: Medicare HMO | Attending: Internal Medicine

## 2022-09-12 DIAGNOSIS — I480 Paroxysmal atrial fibrillation: Secondary | ICD-10-CM

## 2022-09-12 DIAGNOSIS — Z5181 Encounter for therapeutic drug level monitoring: Secondary | ICD-10-CM | POA: Diagnosis not present

## 2022-09-12 DIAGNOSIS — Z8679 Personal history of other diseases of the circulatory system: Secondary | ICD-10-CM

## 2022-09-12 DIAGNOSIS — Z951 Presence of aortocoronary bypass graft: Secondary | ICD-10-CM | POA: Diagnosis not present

## 2022-09-12 DIAGNOSIS — Z953 Presence of xenogenic heart valve: Secondary | ICD-10-CM

## 2022-09-12 LAB — POCT INR: INR: 3.5 — AB (ref 2.0–3.0)

## 2022-09-12 NOTE — Progress Notes (Signed)
Remote pacemaker transmission.   

## 2022-09-12 NOTE — Patient Instructions (Addendum)
Description   Hold today's dose of warfarin then START taking warfarin 1 tablet daily except for 1/2 a tablet on Mondays and Wednesdays. Stay consistent with greens (3-4 times per week)  Recheck INR in 3 weeks.  Coumadin Clinic [phone].

## 2022-09-22 ENCOUNTER — Other Ambulatory Visit (HOSPITAL_COMMUNITY): Payer: Self-pay

## 2022-09-22 ENCOUNTER — Telehealth (HOSPITAL_COMMUNITY): Payer: Self-pay | Admitting: Pharmacy Technician

## 2022-09-22 NOTE — Telephone Encounter (Signed)
Advanced Heart Failure Patient Advocate Encounter  The patient was approved for a [employer] grant that will help cover the cost of Jardiance. Total amount awarded, $10,000. Eligibility, 08/23/22 - 08/23/23.  ID [id number]  BIN [id number]  PCN [id number]  Group [id number]  Called and spoke with the patient's wife. Put billing information into [facility]. Spoke about Entresto assistance through [employer]. That is set to end on 12/11/22. We will use the grant for [facility] next year instead of renewing the assistance. Advised the patient's wife to call when they are ready for a refill next year and we will send it to [facility].  Charlann Boxer, CPhT

## 2022-10-03 ENCOUNTER — Ambulatory Visit: Payer: Medicare HMO | Attending: Internal Medicine

## 2022-10-03 DIAGNOSIS — Z953 Presence of xenogenic heart valve: Secondary | ICD-10-CM

## 2022-10-03 DIAGNOSIS — Z5181 Encounter for therapeutic drug level monitoring: Secondary | ICD-10-CM | POA: Diagnosis not present

## 2022-10-03 LAB — POCT INR: INR: 2.9 (ref 2.0–3.0)

## 2022-10-03 NOTE — Patient Instructions (Signed)
Continue taking warfarin 1 tablet daily except for 1/2 a tablet on Mondays and Wednesdays. Stay consistent with greens (3-4 times per week)  Recheck INR in 5 weeks.  Coumadin Clinic [phone].

## 2022-10-25 DIAGNOSIS — N183 Chronic kidney disease, stage 3 unspecified: Secondary | ICD-10-CM | POA: Diagnosis not present

## 2022-10-25 DIAGNOSIS — I480 Paroxysmal atrial fibrillation: Secondary | ICD-10-CM | POA: Diagnosis not present

## 2022-10-25 DIAGNOSIS — I129 Hypertensive chronic kidney disease with stage 1 through stage 4 chronic kidney disease, or unspecified chronic kidney disease: Secondary | ICD-10-CM | POA: Diagnosis not present

## 2022-10-25 DIAGNOSIS — N390 Urinary tract infection, site not specified: Secondary | ICD-10-CM | POA: Diagnosis not present

## 2022-11-07 ENCOUNTER — Ambulatory Visit: Payer: Medicare HMO | Attending: Cardiology

## 2022-11-07 DIAGNOSIS — Z8679 Personal history of other diseases of the circulatory system: Secondary | ICD-10-CM

## 2022-11-07 DIAGNOSIS — Z953 Presence of xenogenic heart valve: Secondary | ICD-10-CM | POA: Diagnosis not present

## 2022-11-07 DIAGNOSIS — Z5181 Encounter for therapeutic drug level monitoring: Secondary | ICD-10-CM

## 2022-11-07 DIAGNOSIS — Z951 Presence of aortocoronary bypass graft: Secondary | ICD-10-CM

## 2022-11-07 DIAGNOSIS — I480 Paroxysmal atrial fibrillation: Secondary | ICD-10-CM

## 2022-11-07 LAB — POCT INR: INR: 2.5 (ref 2.0–3.0)

## 2022-11-07 NOTE — Patient Instructions (Signed)
Continue taking warfarin 1 tablet daily except for 1/2 a tablet on Mondays and Wednesdays. Stay consistent with greens (3-4 times per week)  Recheck INR in 5 weeks.  Coumadin Clinic [phone]. HAVE DENTAL OFFICE FAX CLEARANCE FORM TO CARDIOLOGY OFFICE.

## 2022-11-09 ENCOUNTER — Other Ambulatory Visit (HOSPITAL_COMMUNITY): Payer: Self-pay

## 2022-11-09 ENCOUNTER — Telehealth: Payer: Self-pay | Admitting: *Deleted

## 2022-11-09 ENCOUNTER — Encounter: Payer: Self-pay | Admitting: *Deleted

## 2022-11-09 MED ORDER — ENTRESTO 24-26 MG PO TABS
1.0000 | ORAL_TABLET | Freq: Two times a day (BID) | ORAL | 3 refills | Status: DC
  Filled 2022-11-09: qty 180, 90d supply, fill #0

## 2022-11-09 NOTE — Patient Outreach (Signed)
  Care Coordination   Initial Visit Note   11/09/2022 Name: Brandon [name] MRN: [id number] DOB: 01/06/1946  Brandon [name] is a 76 y.o. year old male who sees Pa, [place] for primary care. I  spoke with DPR wife Brandon [name].  What matters to the patients health and wellness today?  No needs    Goals Addressed             This Visit's Progress    COMPLETED: No needs       Care Coordination Interventions: Provided education to patient and/or caregiver about advanced directives Reviewed medications with patient and discussed adherence to all medications with no needed refills Reviewed scheduled/upcoming provider appointments including sufficient transportation to all medical appointments Assessed social determinant of health barriers         SDOH assessments and interventions completed:  Yes  SDOH Interventions Today    Flowsheet Row Most Recent Value  SDOH Interventions   Food Insecurity Interventions Intervention Not Indicated  Housing Interventions Intervention Not Indicated  Transportation Interventions Intervention Not Indicated  Utilities Interventions Intervention Not Indicated        Care Coordination Interventions:  Yes, provided   Follow up plan: No further intervention required.   Encounter Outcome:  Pt. Visit Completed   Brandon Mina, RN Care Management Coordinator [place] Office [phone]

## 2022-11-09 NOTE — Patient Instructions (Signed)
Visit Information  Thank you for taking time to visit with me today. Please don't hesitate to contact me if I can be of assistance to you.   Following are the goals we discussed today:   Goals Addressed             This Visit's Progress    COMPLETED: No needs       Care Coordination Interventions: Provided education to patient and/or caregiver about advanced directives Reviewed medications with patient and discussed adherence to all medications with no needed refills Reviewed scheduled/upcoming provider appointments including sufficient transportation to all medical appointments Assessed social determinant of health barriers         Please call the care guide team at [phone] if you need to cancel or reschedule your appointment.   If you are experiencing a Mental Health or [place] or need someone to talk to, please call the Suicide and Crisis Lifeline: 988 call the [place] National Suicide Prevention Lifeline: 913-709-9628 or TTY: 650-523-4931 TTY (561) 339-0604) to talk to a trained counselor call 1-800-273-TALK (toll free, 24 hour hotline)  Patient verbalizes understanding of instructions and care plan provided today and agrees to view in [place]. Active MyChart status and patient understanding of how to access instructions and care plan via MyChart confirmed with patient.     No further follow up required: No needs    Raina Mina, RN Care Management Coordinator [place] Office [phone]

## 2022-11-14 ENCOUNTER — Other Ambulatory Visit (HOSPITAL_COMMUNITY): Payer: Self-pay

## 2022-11-14 DIAGNOSIS — E1169 Type 2 diabetes mellitus with other specified complication: Secondary | ICD-10-CM | POA: Diagnosis not present

## 2022-11-14 DIAGNOSIS — E782 Mixed hyperlipidemia: Secondary | ICD-10-CM | POA: Diagnosis not present

## 2022-11-14 DIAGNOSIS — G8929 Other chronic pain: Secondary | ICD-10-CM | POA: Diagnosis not present

## 2022-11-14 DIAGNOSIS — I10 Essential (primary) hypertension: Secondary | ICD-10-CM | POA: Diagnosis not present

## 2022-11-14 DIAGNOSIS — Z23 Encounter for immunization: Secondary | ICD-10-CM | POA: Diagnosis not present

## 2022-11-14 MED ORDER — ACCU-CHEK GUIDE VI STRP
ORAL_STRIP | 3 refills | Status: AC
  Filled 2022-11-14: qty 100, 90d supply, fill #0

## 2022-11-14 MED ORDER — ACCU-CHEK GUIDE W/DEVICE KIT
PACK | 0 refills | Status: AC
  Filled 2022-11-14: qty 1, 90d supply, fill #0

## 2022-11-14 MED ORDER — ACCU-CHEK SOFTCLIX LANCETS MISC
1.0000 | Freq: Every day | 3 refills | Status: AC
Start: 2022-11-14 — End: ?
  Filled 2022-11-14: qty 102, 90d supply, fill #0
  Filled 2022-11-16: qty 100, 90d supply, fill #0

## 2022-11-15 ENCOUNTER — Other Ambulatory Visit (HOSPITAL_COMMUNITY): Payer: Self-pay

## 2022-11-16 ENCOUNTER — Other Ambulatory Visit (HOSPITAL_COMMUNITY): Payer: Self-pay

## 2022-11-22 ENCOUNTER — Telehealth (HOSPITAL_COMMUNITY): Payer: Self-pay

## 2022-11-22 NOTE — Telephone Encounter (Signed)
Received a medical clearance form from [facility] , requesting Cardiology clearance for the following procedure: multiple extractions. Medical clearance form was signed by Dr. Aundra Dubin and successfully faxed to [phone] on Tuesday, December 12,. Form will be scanned into patients chart.

## 2022-11-28 ENCOUNTER — Ambulatory Visit: Payer: Medicare HMO | Admitting: Podiatry

## 2022-11-28 ENCOUNTER — Encounter: Payer: Self-pay | Admitting: Podiatry

## 2022-11-28 DIAGNOSIS — B351 Tinea unguium: Secondary | ICD-10-CM | POA: Diagnosis not present

## 2022-11-28 DIAGNOSIS — M79674 Pain in right toe(s): Secondary | ICD-10-CM

## 2022-11-28 DIAGNOSIS — M79675 Pain in left toe(s): Secondary | ICD-10-CM

## 2022-11-28 DIAGNOSIS — Z7901 Long term (current) use of anticoagulants: Secondary | ICD-10-CM

## 2022-11-28 NOTE — Progress Notes (Signed)
This patient returns to my office for at risk foot care.  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  This patient is unable to cut nails himself since the patient cannot reach his nails.These nails are painful walking and wearing shoes.  This patient presents for at risk foot care today.  General Appearance  Alert, conversant and in no acute stress.  Vascular  Dorsalis pedis and posterior tibial  pulses are palpable  bilaterally.  Capillary return is within normal limits  bilaterally. Temperature is within normal limits  bilaterally.  Neurologic  Senn-Weinstein monofilament wire test within normal limits  bilaterally. Muscle power within normal limits bilaterally.  Nails Thick disfigured discolored nails with subungual debris  from hallux to fifth toes bilaterally. No evidence of bacterial infection or drainage bilaterally.  Orthopedic  No limitations of motion  feet .  No crepitus or effusions noted.  No bony pathology or digital deformities noted.  Skin  normotropic skin with no porokeratosis noted bilaterally.  No signs of infections or ulcers noted.     Onychomycosis  Pain in right toes  Pain in left toes  Consent was obtained for treatment procedures.   Mechanical debridement of nails 1-5  bilaterally performed with a nail nipper.  Filed with dremel without incident.    Return office visit    3 months                  Told patient to return for periodic foot care and evaluation due to potential at risk complications.   Gardiner Barefoot DPM

## 2022-11-30 ENCOUNTER — Ambulatory Visit (HOSPITAL_COMMUNITY)
Admission: RE | Admit: 2022-11-30 | Discharge: 2022-11-30 | Disposition: A | Discharge status: Home / Self Care | Payer: Medicare HMO | Source: Ambulatory Visit | Attending: Cardiology | Admitting: Cardiology

## 2022-11-30 VITALS — BP 120/74 | HR 56 | Wt 244.0 lb

## 2022-11-30 DIAGNOSIS — Z953 Presence of xenogenic heart valve: Secondary | ICD-10-CM | POA: Diagnosis not present

## 2022-11-30 DIAGNOSIS — Z955 Presence of coronary angioplasty implant and graft: Secondary | ICD-10-CM | POA: Insufficient documentation

## 2022-11-30 DIAGNOSIS — Z950 Presence of cardiac pacemaker: Secondary | ICD-10-CM | POA: Diagnosis not present

## 2022-11-30 DIAGNOSIS — I5042 Chronic combined systolic (congestive) and diastolic (congestive) heart failure: Secondary | ICD-10-CM | POA: Diagnosis not present

## 2022-11-30 DIAGNOSIS — Z8673 Personal history of transient ischemic attack (TIA), and cerebral infarction without residual deficits: Secondary | ICD-10-CM | POA: Diagnosis not present

## 2022-11-30 DIAGNOSIS — I130 Hypertensive heart and chronic kidney disease with heart failure and stage 1 through stage 4 chronic kidney disease, or unspecified chronic kidney disease: Secondary | ICD-10-CM | POA: Insufficient documentation

## 2022-11-30 DIAGNOSIS — Z6834 Body mass index (BMI) 34.0-34.9, adult: Secondary | ICD-10-CM | POA: Diagnosis not present

## 2022-11-30 DIAGNOSIS — I480 Paroxysmal atrial fibrillation: Secondary | ICD-10-CM | POA: Insufficient documentation

## 2022-11-30 DIAGNOSIS — G4733 Obstructive sleep apnea (adult) (pediatric): Secondary | ICD-10-CM | POA: Diagnosis not present

## 2022-11-30 DIAGNOSIS — J849 Interstitial pulmonary disease, unspecified: Secondary | ICD-10-CM | POA: Insufficient documentation

## 2022-11-30 DIAGNOSIS — Z79899 Other long term (current) drug therapy: Secondary | ICD-10-CM | POA: Insufficient documentation

## 2022-11-30 DIAGNOSIS — I447 Left bundle-branch block, unspecified: Secondary | ICD-10-CM | POA: Diagnosis not present

## 2022-11-30 DIAGNOSIS — Z7984 Long term (current) use of oral hypoglycemic drugs: Secondary | ICD-10-CM | POA: Diagnosis not present

## 2022-11-30 DIAGNOSIS — D509 Iron deficiency anemia, unspecified: Secondary | ICD-10-CM | POA: Insufficient documentation

## 2022-11-30 DIAGNOSIS — I2510 Atherosclerotic heart disease of native coronary artery without angina pectoris: Secondary | ICD-10-CM | POA: Diagnosis not present

## 2022-11-30 DIAGNOSIS — Z87891 Personal history of nicotine dependence: Secondary | ICD-10-CM | POA: Insufficient documentation

## 2022-11-30 DIAGNOSIS — I5022 Chronic systolic (congestive) heart failure: Secondary | ICD-10-CM | POA: Diagnosis not present

## 2022-11-30 DIAGNOSIS — E669 Obesity, unspecified: Secondary | ICD-10-CM | POA: Diagnosis not present

## 2022-11-30 DIAGNOSIS — N183 Chronic kidney disease, stage 3 unspecified: Secondary | ICD-10-CM | POA: Insufficient documentation

## 2022-11-30 DIAGNOSIS — E785 Hyperlipidemia, unspecified: Secondary | ICD-10-CM | POA: Diagnosis not present

## 2022-11-30 DIAGNOSIS — J449 Chronic obstructive pulmonary disease, unspecified: Secondary | ICD-10-CM | POA: Insufficient documentation

## 2022-11-30 DIAGNOSIS — Z7901 Long term (current) use of anticoagulants: Secondary | ICD-10-CM | POA: Diagnosis not present

## 2022-11-30 DIAGNOSIS — I4892 Unspecified atrial flutter: Secondary | ICD-10-CM | POA: Insufficient documentation

## 2022-11-30 LAB — COMPREHENSIVE METABOLIC PANEL
ALT: 35 U/L (ref 0–44)
AST: 32 U/L (ref 15–41)
Albumin: 3.8 g/dL (ref 3.5–5.0)
Alkaline Phosphatase: 38 U/L (ref 38–126)
Anion gap: 11 (ref 5–15)
BUN: 40 mg/dL — ABNORMAL HIGH (ref 8–23)
CO2: 19 mmol/L — ABNORMAL LOW (ref 22–32)
Calcium: 9.1 mg/dL (ref 8.9–10.3)
Chloride: 107 mmol/L (ref 98–111)
Creatinine, Ser: 2.58 mg/dL — ABNORMAL HIGH (ref 0.61–1.24)
GFR, Estimated: 25 mL/min — ABNORMAL LOW (ref >60)
Glucose, Bld: 109 mg/dL — ABNORMAL HIGH (ref 70–99)
Potassium: 4.5 mmol/L (ref 3.5–5.1)
Sodium: 137 mmol/L (ref 135–145)
Total Bilirubin: 0.4 mg/dL (ref 0.3–1.2)
Total Protein: 7.3 g/dL (ref 6.5–8.1)

## 2022-11-30 LAB — LIPID PANEL
Cholesterol: 123 mg/dL (ref 0–200)
HDL: 46 mg/dL (ref >40)
LDL Cholesterol: 51 mg/dL (ref 0–99)
Total CHOL/HDL Ratio: 2.7 RATIO
Triglycerides: 131 mg/dL (ref <150)
VLDL: 26 mg/dL (ref 0–40)

## 2022-11-30 LAB — BRAIN NATRIURETIC PEPTIDE: B Natriuretic Peptide: 124.6 pg/mL — ABNORMAL HIGH (ref 0.0–100.0)

## 2022-11-30 LAB — TSH: TSH: 2.26 u[IU]/mL (ref 0.350–4.500)

## 2022-11-30 NOTE — Patient Instructions (Signed)
There has been no changes to your medications.  Labs done today, your results will be available in MyChart, we will contact you for abnormal readings.  Your physician has requested that you have an echocardiogram. Echocardiography is a painless test that uses sound waves to create images of your heart. It provides your doctor with information about the size and shape of your heart and how well your heart's chambers and valves are working. This procedure takes approximately one hour. There are no restrictions for this procedure. Please do NOT wear cologne, perfume, aftershave, or lotions (deodorant is allowed). Please arrive 15 minutes prior to your appointment time.  Your physician recommends that you schedule a follow-up appointment in: 4 months with an echocardiogram ( April 2024) ** please call the office in February to arrange your follow up appointment **  If you have any questions or concerns before your next appointment please send [place] a message through [place] or call our office at [phone].    TO LEAVE A MESSAGE FOR THE NURSE SELECT OPTION 2, PLEASE LEAVE A MESSAGE INCLUDING: YOUR NAME DATE OF BIRTH CALL BACK NUMBER REASON FOR CALL**this is important as we prioritize the call backs  YOU WILL RECEIVE A CALL BACK THE SAME DAY AS LONG AS YOU CALL BEFORE 4:00 PM  At the [facility], you and your health needs are our priority. As part of our continuing mission to provide you with exceptional heart care, we have created designated Provider Care Teams. These Care Teams include your primary Cardiologist (physician) and Advanced Practice Providers (APPs- Physician Assistants and Nurse Practitioners) who all work together to provide you with the care you need, when you need it.   You may see any of the following providers on your designated Care Team at your next follow up: Dr Glori Bickers Dr Loralie Champagne Dr. Roxana Hires, NP Lyda Jester,  [place] [facility] [place], [place] Forestine Na, NP Audry Riles, PharmD   Please be sure to bring in all your medications bottles to every appointment.

## 2022-12-01 ENCOUNTER — Ambulatory Visit (INDEPENDENT_AMBULATORY_CARE_PROVIDER_SITE_OTHER): Payer: Medicare HMO

## 2022-12-01 DIAGNOSIS — I442 Atrioventricular block, complete: Secondary | ICD-10-CM | POA: Diagnosis not present

## 2022-12-01 LAB — CUP PACEART REMOTE DEVICE CHECK
Battery Remaining Longevity: 103 mo
Battery Voltage: 3 V
Brady Statistic AP VP Percent: 0.48 %
Brady Statistic AP VS Percent: 54.83 %
Brady Statistic AS VP Percent: 3.03 %
Brady Statistic AS VS Percent: 41.67 %
Brady Statistic RA Percent Paced: 55.44 %
Brady Statistic RV Percent Paced: 3.5 %
Date Time Interrogation Session: 20231220135149
Implantable Lead Connection Status: 753985
Implantable Lead Connection Status: 753985
Implantable Lead Implant Date: 20181211
Implantable Lead Implant Date: 20181211
Implantable Lead Location: 753859
Implantable Lead Location: 753860
Implantable Lead Model: 3830
Implantable Lead Model: 5076
Implantable Pulse Generator Implant Date: 20181211
Lead Channel Impedance Value: 304 Ohm
Lead Channel Impedance Value: 342 Ohm
Lead Channel Impedance Value: 418 Ohm
Lead Channel Impedance Value: 418 Ohm
Lead Channel Pacing Threshold Amplitude: 0.625 V
Lead Channel Pacing Threshold Amplitude: 1.375 V
Lead Channel Pacing Threshold Pulse Width: 0.4 ms
Lead Channel Pacing Threshold Pulse Width: 0.4 ms
Lead Channel Sensing Intrinsic Amplitude: 3.625 mV
Lead Channel Sensing Intrinsic Amplitude: 3.625 mV
Lead Channel Sensing Intrinsic Amplitude: 6.625 mV
Lead Channel Sensing Intrinsic Amplitude: 6.625 mV
Lead Channel Setting Pacing Amplitude: 2 V
Lead Channel Setting Pacing Amplitude: 2.5 V
Lead Channel Setting Pacing Pulse Width: 0.7 ms
Lead Channel Setting Sensing Sensitivity: 1.2 mV
Zone Setting Status: 755011
Zone Setting Status: 755011

## 2022-12-01 NOTE — Progress Notes (Signed)
Advanced Heart Failure Clinic Note   PCP: Dr. Justin Mend Cardiology: Dr. Radford Pax HF Cardiology: Dr. Aundra Dubin  76 y.o.with history of CVA, paroxysmal atrial fibrillation, valvular heart disease, and chronic diastolic CHF presents for evaluation of CHF and valvular heart disease.    He had had episodes of dyspnea and initial workup led to a TEE in 5/18 showing normal EF with moderate AS and moderate MR.  He continued to have episodic dyspnea and ended up admitted in 8/18 with shortness of breath and chest pressure.  This led to a long, complicated hospitalization.  He was noted to be volume overloaded with acute diastolic CHF and was also noted to have symptomatic runs of atrial fibrillation with RVR.  Amiodarone was started to control the atrial fibrillation.  He developed respiratory distress => Bipap => intubated.  He became hypotensive, requiring pressors.  He developed AKI.  PNA was noted and he received broad spectrum abx.  He had a prolonged intubation but was eventually extubated.  Given deconditioning from long hospitalization, he went to CIR for a couple of weeks.  LHC in 9/18 showed occluded PDA with left>right collaterals.  TEE in 9/18 showed EF 50%, severe MR (possibly infarct-related with restricted posterior leaflet, and moderate AS + moderate-severe AI (possibly rheumatic).   In 12/18, he had cardiac surgery with bioprosthetic aortic and mitral valves placed.  He had SVG-PDA, Maze, and LA appendage clipping.  Post-op course was complicated by CHB requiring Medtronic dual chamber PPM (His bundle lead placed but captured right bundle so induced LBBB).   Echo 01/02/18 LVEF 30-35%, bioprosthetic MV and AoV function normally, RV mildly dilated with normal systolic function.    He was noted to be in atrial flutter with mild RVR and had TEE-guided DCCV on 04/11/18. TEE showed EF 25-30%, normal bioprosthetic aortic and mitral valves.  Echo in 6/19 showed EF 30-35%, normal bioprosthetic aortic and mitral  valves.  Echo 12/19 with EF up to 50-55%, normal bioprosthetic mitral and aortic valves, normal RV.   In 2020, he went back into atrial fibrillation.  Amiodarone was started, and he returned to NSR.   Echo in 11/21 showed EF 50-55%, mild LVH, mildly dilated RV with normal function, bioprosthetic mitral valve with mean gradient 5 mmHg and no MR, bioprosthetic aortic valve with mean gradient 12 and no AI.  PFTs in 1/22 were restrictive.  High resolution CT chest was done in 2/22, concerning for ILD, usual interstitial pneumonitis.  He saw pulmonary, ESR was mildly elevated.  We decided to try him off amiodarone given concern for possible amiodarone-induced lung toxicity.  He then developed atrial fibrillation off amiodarone, was quite symptomatic.  He underwent TEE-guided DCCV back to NSR in 7/22 and amiodarone was restarted.  TEE showed EF 50-55%, mid septal HK, RV normal, bioprosthetic aortic and mitral valves appeared normal.   EP tried decreasing amiodarone to 100 mg daily.  However, patient then developed runs of atrial tachycardia (had to be paced out).  Amiodarone was increased back to 200 mg daily.   Today he returns for HF follow up.  Doing well overall.  No chest pain.  No dyspnea with usual activities.  Walked in the office with no problems.  No lightheadedness or palpitations.  He is in NSR.  No cough.  Main complaint is knee pain.   ECG (personally reviewed): a-paced with long PR interval, LBBB 138 msec  Device interrogation (personally reviewed): Rare AF, 55% a-paced  Labs (9/18): K 4.5, creatinine 1.42, LDL  90, HDL 36, LFTs normal, TSH normal, hgb 9.6 Labs (10/18): K 4.3, creatinine 1.45 Labs (12/18): K 3.8, creatinine 1.56 Labs (1/19): creatinine 1.68, LDL 66 Labs (3/19): K 4.2, creatinine 1.56 Labs (4/19): K 4.4, creatinine 1.71, hgb 11.8, LDL 75, HDL 43 Labs (6/19): K 4.8, creatinine 2.1, LDL 75, TGs 246 Labs (8/19): K 4.6, creatinine 2.07 Labs (11/19): K 4.6, creatinine  1.29 Labs (2/20): K 4.8, creatinine 1.98 Labs (5/20): LDL 49, HDL 39 Labs (7/20): TSH normal Labs (9/20): TSH normal, K 4.4, creatinine 2.03, hgb 13.5, LFTs normal Labs (12/20): K 4.4, creatinine 1.87, hgb 13.1.  Labs (11/21): LDL 54, K 4.6, creatinine 2.22, LFTs normal, TSH normal Labs (7/22): TSH normal, K 4.5, creatinine 2.98, hgb 13.7 Labs (8/22): K 4.4, creatinine 2.23 Labs (2/23): BNP 299 Labs (3/23): K 3.9, creatinine 2.21 Labs (5/23): K 4.4, creatinine 2.29 Labs (8/23): K 4.3, creatinine 2.37  PMH: 1. CVA: Right MCA, 2004.  Minimal residual problems.  2. HTN - Peripheral edema with amlodipine.  3. Hyperlipidemia 4. Left rotator cuff surgery 6/18 5. Carotid stenosis: s/p right carotid stent.  6. GERD 7. H/o CCY 8. Anemia: FOBT negative.  9. Gout  10. Atrial fibrillation: Paroxysmal. Maze in 12/18 with LAA clipping.  - Recurrent atrial fibrillation 2020 => amiodarone.  - Recurrent atrial fibrillation off amiodarone in 7/22, DCCV to NSR and amiodarone restarted.  11. Valvular heart disease: TEE (5/18) with EF 55-60%, moderate AS mean 33 mmHg, moderate MR, normal RV size and systolic function.  - Echo (8/18): EF 55-60%, moderate LVH, moderate AS with mean gradient 33 mmHg, moderate AI, moderate to severe MR.  - TEE (9/18): EF 50%, mild LV dilation, suspect severe MR with posterior leaflet restricted (?infarct-related MR), ERO 0.4 cm^2, moderate AS/moderate-severe AI (possibly rheumatic).   - In 12/18, he had cardiac surgery with bioprosthetic aortic and mitral valves placed.  He had SVG-PDA, Maze, and LA appendage clipping. - Echo (1/19): LVEF 30-35%, bioprosthetic MV and AoV function normally, RV mildly dilated with normal systolic function.  - TEE (5/19) with EF 25-30%, normal bioprosthetic aortic valve, normal bioprosthetic mitral valve. - Echo (6/19): EF 30-35%, mild LV dilation with diffuse hypokinesis, normal RV size with mildly decreased systolic function,  normal-appearing bioprosthetic mitral and aortic valves.   - Echo (12/19): EF 50-55%, moderate LVH, bioprosthetic aortic valve with mean gradient 10 mmHg, normally-functioning bioprosthetic mitral valve.  - Echo (11/21): EF 50-55%, mild LVH, mildly dilated RV with normal function, bioprosthetic mitral valve with mean gradient 5 mmHg and no MR, bioprosthetic aortic valve with mean gradient 12 and no AI.   - TEE (7/22): EF 50-55%, septal HK, normal RV size/systolic function, LA appendage clipped, bioprosthetic aortic and mitral valves appeared normal.  12. Chronic systolic CHF 13. Deafness 14. CKD: stage 3.  15. 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  17. COPD: Mild obstruction on 9/18 PFTs.  - PFTs (10/20): Mild obstruction, decreased DLCO.  18. Post-op CHB in 12/18 with Medtronic dual chamber PPM.  19. Chronic systolic CHF: Echo (9/89) with EF down to 30-35% post-op.  - TEE (5/19) with EF 25-30%, normal bioprosthetic aortic valve, normal bioprosthetic mitral valve.  - Echo (12/19): EF 50-55%, moderate LVH, bioprosthetic aortic valve with mean gradient 10 mmHg, normally-functioning bioprosthetic mitral valve.  20. Atrial flutter: DCCV in 5/19.  21. OSA: Not using  CPAP.  22. Interstitial lung disease: PFTs in 1/22 concerning for restriction.  High resolution chest CT in 2/22 with possible UIP.  23. Atrial tachycardia: Pace-terminated.   SH: Quit smoking 8/18.  Married, 2 children, retired.    Family History  Problem Relation Age of Onset   Stroke Father        No details   Angina Mother    Kidney cancer Brother    Review of systems complete and found to be negative unless listed in HPI.    Current Outpatient Medications  Medication Sig Dispense Refill   Accu-Chek Softclix Lancets lancets Use as directed to check blood sugar daily and as needed  100 each 3   acetaminophen (TYLENOL) 500 MG tablet Take 1,000 mg by mouth every 6 (six) hours as needed (pain.).     amiodarone (PACERONE) 200 MG tablet Take 1 tablet (200 mg total) by mouth daily. 90 tablet 3   Azelastine HCl 137 MCG/SPRAY SOLN Use 1 spray in each nostril 2 times a day 90 mL 3   Blood Glucose Monitoring Suppl (ACCU-CHEK GUIDE) w/Device KIT check your sugar once daily and as needed as directed 1 kit 0   calcium carbonate (TUMS - DOSED IN MG ELEMENTAL CALCIUM) 500 MG chewable tablet Chew 1 tablet by mouth daily as needed for indigestion or heartburn.     carvedilol (COREG) 6.25 MG tablet Take 1 tablet (6.25 mg total) by mouth 2 (two) times daily. 180 tablet 3   cetirizine (ZYRTEC) 10 MG tablet Take 10 mg by mouth daily as needed for allergies.     citalopram (CELEXA) 20 MG tablet Take 1 tablet (20 mg total) by mouth at bedtime. 30 tablet 5   docusate sodium (COLACE) 100 MG capsule Take 1 capsule (100 mg total) by mouth 3 (three) times daily as needed. 20 capsule 0   empagliflozin (JARDIANCE) 10 MG TABS tablet TAKE 1 TABLET BY MOUTH ONCE A DAY BEFORE BREAKFAST 90 tablet 3   fluticasone (FLONASE) 50 MCG/ACT nasal spray Place into both nostrils daily as needed for allergies or rhinitis.     furosemide (LASIX) 40 MG tablet Take 1 tablet (40 mg total) by mouth daily. 90 tablet 3   glucose blood (ACCU-CHEK GUIDE) test strip Use as directed to check blood sugar once daily and as needed 100 strip 3   loratadine (CLARITIN) 10 MG tablet Take 10 mg by mouth daily as needed for allergies or rhinitis.     methocarbamol (ROBAXIN) 500 MG tablet Take 1 tablet (500 mg total) by mouth every 8 (eight) hours as needed for muscle spasms. 30 tablet 0   pantoprazole (PROTONIX) 40 MG tablet Take 1 tablet (40 mg total) by mouth daily. 90 tablet 3   Pseudoeph-Doxylamine-DM-APAP (NYQUIL PO) Take 30 mLs by mouth daily as needed (Cold Symptons).     rosuvastatin (CRESTOR) 10 MG tablet TAKE 1 TABLET EVERY DAY 90  tablet 3   sacubitril-valsartan (ENTRESTO) 24-26 MG Take 1 tablet by mouth 2 (two) times daily. 180 tablet 3   spironolactone (ALDACTONE) 25 MG tablet TAKE 1 TABLET EVERY DAY 90 tablet 3   traZODone (DESYREL) 50 MG tablet Take 50 mg by mouth at bedtime.     warfarin (COUMADIN) 2.5 MG tablet TAKE 1/2 TO 1 TABLET ONE TIME DAILY AS DIRECTED BY COUMADIN CLINIC . DOSE CHANGE 90 tablet 1   No current facility-administered medications for this encounter.   BP 120/74   Pulse (!) 56   Wt 110.7 kg (  244 lb)   SpO2 92%   BMI 34.03 kg/m   Wt Readings from Last 3 Encounters:  11/30/22 110.7 kg (244 lb)  07/25/22 114.6 kg (252 lb 9.6 oz)  06/20/22 117.3 kg (258 lb 9.6 oz)   General: NAD Neck: No JVD, no thyromegaly or thyroid nodule.  Lungs: Clear to auscultation bilaterally with normal respiratory effort. CV: Nondisplaced PMI.  Heart regular S1/S2, no S3/S4, no murmur.  No peripheral edema.  No carotid bruit.  Normal pedal pulses.  Abdomen: Soft, nontender, no hepatosplenomegaly, no distention.  Skin: Intact without lesions or rashes.  Neurologic: Alert and oriented x 3.  Psych: Normal affect. Extremities: No clubbing or cyanosis.  HEENT: Normal.   Assessment/Plan: 1. Valvular heart disease: TEE in 9/18 showed severe MR, probably infarct-related with restrictive posterior mitral leaflet. There was moderate aortic stenosis and moderate-severe aortic insufficiency, possible rheumatic.  In 12/18, he had bioprosthetic MVR and AVR. Valves stable on TEE in 7/22. - Antibiotic prophylaxis needed with dental work.  - Echo at followup in 4 months.  2. Chronic systolic => diastolic CHF: In setting of significant valvular disease as above. EF down to 30-35% post-op (1/19 echo), likely reflects true LV systolic function without the volume load from severe MR and moderate-severe AI.  TEE in 5/19 showed EF 25-30%.  Repeat echo in 6/19 with EF 30-35%. Repeat echo in 12/19 showed EF up to 50-55%, echo in 11/21  again showed EF 50-55% with normal RV systolic function.  TEE in 7/22 showed EF 50-55% with normal RV.  He is not volume overloaded on exam.  NYHA class II symptoms, not volume overloaded on exam. GDMT complicated by CKD stage 3.   - Continue Entresto 24/26 bid, BMET/BNP today.  - Continue Lasix 40 mg daily.     - Continue Coreg 6.25 mg bid.   - Continue spironolactone 25 mg daily. - Continue Jardiance 10 mg daily.  3. CKD: Stage 3.  - As above, continue Jardiance.  - BMET today.  4. Atrial fibrillation/flutter: Paroxysmal. He has had a Maze procedure.  He tends to tolerate atrial arrhythmias poorly.  He had atrial flutter with DCCV in 5/19.  Recurrent atrial fibrillation in 2020.  Stopped amiodarone and went into atrial fibrillation in 6/22, very symptomatic.  Amiodarone restarted and he was cardioverted to NSR in 7/22.   - He is on warfarin. INR followed by coumadin clinic.  CBC today.  - Continue amiodarone 200 mg daily for now.  Check LFTs and TSH.  He will need regular eye exams.  CT chest in past showed possible early ILD, ESR was only mildly elevated.  He saw pulmonary, not definite amiodarone-induced lung toxicity but need to follow closely.  Would prefer him off amiodarone, but he developed atrial tachycardia with RVR when we decreased the dose.  He is getting a repeat HRCT chest in 1/24 and then will followup with Dr. Vaughan Browner.  5. COPD: Mild obstruction on 10/20 PFTs, decreased DLCO.  He has quit smoking.   - Follows with pulmonary.  6. CVA: Remote, minimal residual. S/p carotid stenting.  - No ASA given warfarin use.  7. Hyperlipidemia: Continue Crestor.   - Check lipids 8. Anemia: Fe deficiency. CBC today.  9. 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  The artery to the PDA was occluded with left to right collaterals.  He had SVG-PDA in 12/18. No chest pain.  - Continue  statin.      - No ASA given warfarin use.  10. CHB: has Medtronic PPM.  He a-paces a high percentage of the time but there is minimal RV pacing.  11. OSA: Severe OSA, he does not want to use CPAP.  12. Obesity: Body mass index is 34.03 kg/m. - He decided against Ozempic for weight loss.  - Down 8 lbs with diet and exercise. 13. HTN: BP controlled.  14. Interstitial lung disease: Restrictive PFTs in 1/22.  High resolution CT chest concerning for ILD, possibly usual interstitial pneumonitis.  He saw pulmonary, could not rule out amiodarone lung toxicity so tried him off amiodarone.  He quickly went back into atrial fibrillation that was tolerated poorly, so amiodarone restarted.  We again tried to wean amiodarone but he developed atrial tachycardia with RVR.  Now back on amiodarone 200 mg daily.  - Repeat HRCT chest scheduled for 1/24 - Continue to followup with pulmonary.   Followup in 4 months with echo.  Brandon [name]  12/01/2022

## 2022-12-02 ENCOUNTER — Telehealth (HOSPITAL_COMMUNITY): Payer: Self-pay

## 2022-12-02 DIAGNOSIS — I5022 Chronic systolic (congestive) heart failure: Secondary | ICD-10-CM

## 2022-12-02 MED ORDER — FUROSEMIDE 20 MG PO TABS: 20.0000 mg | ORAL_TABLET | Freq: Every day | ORAL | 2 refills | Status: DC

## 2022-12-02 NOTE — Telephone Encounter (Signed)
-----   Message from Larey Dresser, MD sent at 12/01/2022 11:13 PM EST ----- Decrease Lasix to 20 mg daily. BMET 10 days.

## 2022-12-02 NOTE — Telephone Encounter (Signed)
Pt aware, agreeable, and verbalized understanding 

## 2022-12-07 ENCOUNTER — Other Ambulatory Visit (HOSPITAL_COMMUNITY): Payer: Self-pay

## 2022-12-07 MED ORDER — ENTRESTO 24-26 MG PO TABS: 1.0000 | ORAL_TABLET | Freq: Two times a day (BID) | ORAL | 3 refills | Status: DC

## 2022-12-14 ENCOUNTER — Ambulatory Visit (INDEPENDENT_AMBULATORY_CARE_PROVIDER_SITE_OTHER): Payer: Medicare HMO | Admitting: *Deleted

## 2022-12-14 ENCOUNTER — Ambulatory Visit
Admission: RE | Admit: 2022-12-14 | Discharge: 2022-12-14 | Disposition: A | Discharge status: Home / Self Care | Payer: Medicare HMO | Source: Ambulatory Visit | Attending: Cardiology | Admitting: Cardiology

## 2022-12-14 DIAGNOSIS — I480 Paroxysmal atrial fibrillation: Secondary | ICD-10-CM | POA: Diagnosis not present

## 2022-12-14 DIAGNOSIS — Z5181 Encounter for therapeutic drug level monitoring: Secondary | ICD-10-CM | POA: Diagnosis not present

## 2022-12-14 DIAGNOSIS — Z8679 Personal history of other diseases of the circulatory system: Secondary | ICD-10-CM

## 2022-12-14 DIAGNOSIS — Z9889 Other specified postprocedural states: Secondary | ICD-10-CM | POA: Diagnosis not present

## 2022-12-14 DIAGNOSIS — Z953 Presence of xenogenic heart valve: Secondary | ICD-10-CM | POA: Diagnosis not present

## 2022-12-14 DIAGNOSIS — I5022 Chronic systolic (congestive) heart failure: Secondary | ICD-10-CM

## 2022-12-14 DIAGNOSIS — Z951 Presence of aortocoronary bypass graft: Secondary | ICD-10-CM

## 2022-12-14 LAB — BASIC METABOLIC PANEL
Anion gap: 10 (ref 5–15)
BUN: 37 mg/dL — ABNORMAL HIGH (ref 8–23)
CO2: 20 mmol/L — ABNORMAL LOW (ref 22–32)
Calcium: 8.9 mg/dL (ref 8.9–10.3)
Chloride: 105 mmol/L (ref 98–111)
Creatinine, Ser: 2.14 mg/dL — ABNORMAL HIGH (ref 0.61–1.24)
GFR, Estimated: 31 mL/min — ABNORMAL LOW (ref >60)
Glucose, Bld: 98 mg/dL (ref 70–99)
Potassium: 4.8 mmol/L (ref 3.5–5.1)
Sodium: 135 mmol/L (ref 135–145)

## 2022-12-14 LAB — POCT INR: INR: 1.5 — AB (ref 2.0–3.0)

## 2022-12-14 NOTE — Patient Instructions (Signed)
Description   Today take 1 tablet of warfarin and take 1.5 tablets tomorrow then continue taking warfarin 1 tablet daily except for 1/2 a tablet on Mondays and Wednesdays. Stay consistent with greens (3-4 times per week).  Recheck INR in 2 weeks (normally 6 weeks). Coumadin Clinic [phone]. HAVE DENTAL OFFICE FAX CLEARANCE FORM TO CARDIOLOGY OFFICE.

## 2022-12-22 ENCOUNTER — Other Ambulatory Visit (HOSPITAL_COMMUNITY): Payer: Self-pay

## 2022-12-23 NOTE — Progress Notes (Signed)
Remote pacemaker transmission.   

## 2022-12-27 ENCOUNTER — Ambulatory Visit
Admission: RE | Admit: 2022-12-27 | Discharge: 2022-12-27 | Disposition: A | Discharge status: Home / Self Care | Payer: Medicare HMO | Source: Ambulatory Visit | Attending: Pulmonary Disease | Admitting: Pulmonary Disease

## 2022-12-27 DIAGNOSIS — I700 Atherosclerosis of aorta: Secondary | ICD-10-CM | POA: Diagnosis not present

## 2022-12-27 DIAGNOSIS — R590 Localized enlarged lymph nodes: Secondary | ICD-10-CM | POA: Diagnosis not present

## 2022-12-27 DIAGNOSIS — J84112 Idiopathic pulmonary fibrosis: Secondary | ICD-10-CM | POA: Diagnosis not present

## 2022-12-27 DIAGNOSIS — J849 Interstitial pulmonary disease, unspecified: Secondary | ICD-10-CM

## 2022-12-27 DIAGNOSIS — R918 Other nonspecific abnormal finding of lung field: Secondary | ICD-10-CM | POA: Diagnosis not present

## 2022-12-28 ENCOUNTER — Ambulatory Visit: Payer: Medicare HMO | Attending: Cardiovascular Disease | Admitting: *Deleted

## 2022-12-28 DIAGNOSIS — Z8679 Personal history of other diseases of the circulatory system: Secondary | ICD-10-CM | POA: Diagnosis not present

## 2022-12-28 DIAGNOSIS — Z5181 Encounter for therapeutic drug level monitoring: Secondary | ICD-10-CM

## 2022-12-28 DIAGNOSIS — Z953 Presence of xenogenic heart valve: Secondary | ICD-10-CM | POA: Diagnosis not present

## 2022-12-28 DIAGNOSIS — Z9889 Other specified postprocedural states: Secondary | ICD-10-CM

## 2022-12-28 DIAGNOSIS — I480 Paroxysmal atrial fibrillation: Secondary | ICD-10-CM | POA: Diagnosis not present

## 2022-12-28 DIAGNOSIS — Z951 Presence of aortocoronary bypass graft: Secondary | ICD-10-CM

## 2022-12-28 LAB — POCT INR: INR: 1.5 — AB (ref 2.0–3.0)

## 2022-12-28 NOTE — Patient Instructions (Signed)
Description   Today take 1 tablet of warfarin and take 1.5 tablets tomorrow then START taking warfarin 1 tablet daily except for 1/2 a tablet on Wednesdays. Stay consistent with greens (3-4 times per week).  Recheck INR in 2 weeks (normally 6 weeks). [facility]in Clinic [phone]. HAVE DENTAL OFFICE FAX CLEARANCE FORM TO CARDIOLOGY OFFICE.

## 2022-12-29 ENCOUNTER — Encounter: Payer: Self-pay | Admitting: Cardiovascular Disease

## 2022-12-29 NOTE — Telephone Encounter (Signed)
Error

## 2023-01-02 ENCOUNTER — Encounter: Payer: Self-pay | Admitting: Pulmonary Disease

## 2023-01-02 ENCOUNTER — Ambulatory Visit: Payer: Medicare HMO | Admitting: Pulmonary Disease

## 2023-01-02 VITALS — BP 120/62 | HR 60 | Temp 97.7°F | Ht 71.0 in | Wt 246.6 lb

## 2023-01-02 DIAGNOSIS — R0600 Dyspnea, unspecified: Secondary | ICD-10-CM

## 2023-01-02 DIAGNOSIS — J849 Interstitial pulmonary disease, unspecified: Secondary | ICD-10-CM | POA: Diagnosis not present

## 2023-01-02 NOTE — Patient Instructions (Addendum)
I am glad you are stable with your breathing Your CT scan does not show significant changes which is good news Will order PFTs in 6 months and follow-up in clinic in 6 months after PFTs

## 2023-01-02 NOTE — Progress Notes (Signed)
Brandon [name]    [id number]    1946/05/21  Primary Care Physician:Pa, [place]  Referring Physician: [facility] [address] [place],  [place] [address]  Chief complaint:   Follow-up for abnormal CT, concern for ILD  HPI: 77 y.o. ex-smoker with significant cardiac history of  CVA, paroxysmal atrial fibrillation, valvular heart disease, and chronic diastolic CHF.  He had a prolonged hospitalization in 2018 for heart failure, A. fib with RVR.  Eventually underwent aortic and mitral valve replacement, Maze procedure, LAA clipping  He was started on amiodarone around 2018 2019 and continues on at 800 mg a day.  Follows with Dr. Aundra Dubin in the heart failure clinic  Over the past few months he has noted increasing dyspnea on exertion.  He had a work-up done including PFTs and CT scan which showed changes suggestive of interstitial lung disease and has been referred here for further evaluation.  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  He did get a sed rate which shows slightly elevated at 46  Amiodarone was temporarily held however restarted in June 2022 as he is back in atrial fibrillation He had an  ILD panel which showed marked elevation of CCP.  Evaluated by rheumatology in early May 2022 with no findings of rheumatoid arthritis.  Elevated CCP was felt to be incidental  He was tried on Trelegy inhaler but was stopped after it made his breathing worse with increasing coughing  Pets: 2 cats Occupation: Retired Dealer at [employer]. Exposures: Exposure to chemicals while working.  No ongoing exposures.  No mold, hot tub, Jacuzzi.  No feather pillows or comforter Smoking history: 30-pack-year smoker.  Quit in 2018 Travel history: No significant travel history Relevant family history: No family history of lung disease  Interim history: States that breathing is doing well.   He continues to have issues  with recurrent atrial fibrillation and underwent ablation on 12/16/2021.  He was attempted to be taken off amiodarone in 2023 but quickly went back into atrial fibrillation and hence the medication was restarted.  Outpatient Encounter Medications as of 01/02/2023  Medication Sig   Accu-Chek Softclix Lancets lancets Use as directed to check blood sugar daily and as needed   acetaminophen (TYLENOL) 500 MG tablet Take 1,000 mg by mouth every 6 (six) hours as needed (pain.).   amiodarone (PACERONE) 200 MG tablet Take 1 tablet (200 mg total) by mouth daily.   Azelastine HCl 137 MCG/SPRAY SOLN Use 1 spray in each nostril 2 times a day   Blood Glucose Monitoring Suppl (ACCU-CHEK GUIDE) w/Device KIT check your sugar once daily and as needed as directed   calcium carbonate (TUMS - DOSED IN MG ELEMENTAL CALCIUM) 500 MG chewable tablet Chew 1 tablet by mouth daily as needed for indigestion or heartburn.   carvedilol (COREG) 6.25 MG tablet Take 1 tablet (6.25 mg total) by mouth 2 (two) times daily.   cetirizine (ZYRTEC) 10 MG tablet Take 10 mg by mouth daily as needed for allergies.   citalopram (CELEXA) 20 MG tablet Take 1 tablet (20 mg total) by mouth at bedtime.   docusate sodium (COLACE) 100 MG capsule Take 1 capsule (100 mg total) by mouth 3 (three) times daily as needed.   empagliflozin (JARDIANCE) 10 MG TABS tablet TAKE 1 TABLET BY MOUTH ONCE A DAY BEFORE BREAKFAST   fluticasone (FLONASE) 50 MCG/ACT nasal spray Place into both nostrils daily as  needed for allergies or rhinitis.   furosemide (LASIX) 20 MG tablet Take 1 tablet (20 mg total) by mouth daily.   glucose blood (ACCU-CHEK GUIDE) test strip Use as directed to check blood sugar once daily and as needed   loratadine (CLARITIN) 10 MG tablet Take 10 mg by mouth daily as needed for allergies or rhinitis.   methocarbamol (ROBAXIN) 500 MG tablet Take 1 tablet (500 mg total) by mouth every 8 (eight) hours as needed for muscle spasms.   pantoprazole  (PROTONIX) 40 MG tablet Take 1 tablet (40 mg total) by mouth daily.   Pseudoeph-Doxylamine-DM-APAP (NYQUIL PO) Take 30 mLs by mouth daily as needed (Cold Symptons).   rosuvastatin (CRESTOR) 10 MG tablet TAKE 1 TABLET EVERY DAY   sacubitril-valsartan (ENTRESTO) 24-26 MG Take 1 tablet by mouth 2 (two) times daily.   spironolactone (ALDACTONE) 25 MG tablet TAKE 1 TABLET EVERY DAY   traZODone (DESYREL) 50 MG tablet Take 50 mg by mouth at bedtime.   warfarin (COUMADIN) 2.5 MG tablet TAKE 1/2 TO 1 TABLET ONE TIME DAILY AS DIRECTED BY COUMADIN CLINIC . DOSE CHANGE   No facility-administered encounter medications on file as of 01/02/2023.   Physical Exam: Blood pressure 120/62, pulse 60, temperature 97.7 F (36.5 C), temperature source Oral, height 5\' 11"  (1.803 m), weight 246 lb 9.6 oz (111.9 kg), SpO2 97 %. Gen:      No acute distress HEENT:  EOMI, sclera anicteric Neck:     No masses; no thyromegaly Lungs:    Clear to auscultation bilaterally; normal respiratory effort CV:         Regular rate and rhythm; no murmurs Abd:      + bowel sounds; soft, non-tender; no palpable masses, no distension Ext:    No edema; adequate peripheral perfusion Skin:      Warm and dry; no rash Neuro: alert and oriented x 3 Psych: normal mood and affect   Data Reviewed: Imaging: CT coronary 09/28/17-no interstitial lung disease, mild bibasal atelectasis  High-res CT 01/20/2021-bilateral groundglass attenuation most evident in the mid to lower lungs with mild reticulation, moderate air trapping.   High-res CT 05/08/2021-minimal nonspecific peripheral interstitial opacity, air trapping, bronchial wall thickening.  High-res CT 12/27/2022-moderate patchy air trapping, minimal nonspecific interstitial opacities and reticulation. I have reviewed the images personally  PFTs: 01/07/2021 FVC 2.70 (60%), FEV1 2.19 (16%), F/F 81, TLC 6.03 [82%], DLCO 13.20 [50%] Moderate diffusion defect  06/10/2021 FVC 2.95 [6%], FEV1  2.33 [73%], F/F 79, TLC 6.92 [95%], DLCO 17.20 (66%] Mild restriction, diffusion defect.  Improved compared to January 2022  Labs: Sed rate 01/29/21-46  CTD serologies 02/24/2021-positive for CCP greater than 250  Assessment:  Evaluation for interstitial lung disease CT scan which shows groundglass opacities with minimal reticulation.  The appearance of this is nonspecific and there is no clear evidence of interstitial lung disease or pulmonary fibrosis.  The groundglass changes may be from heart failure  Back on amiodarone due to recurrent atrial fibrillation and looks like he needs to be on this medication to keep his rhythm under control.  His follow-up CT scan is reassuring as it is stable Off inhalers its not helping with the breathing  Continue monitoring Get PFTs in 6 months and return to clinic  Plan/Recommendations: -PFTs in 6 months.  Marshell Garfinkel MD [facility] 01/02/2023, 2:00 PM  CC: Pa, [facility]*

## 2023-01-11 ENCOUNTER — Ambulatory Visit: Payer: Medicare HMO | Attending: Cardiology

## 2023-01-11 DIAGNOSIS — Z953 Presence of xenogenic heart valve: Secondary | ICD-10-CM | POA: Diagnosis not present

## 2023-01-11 DIAGNOSIS — Z5181 Encounter for therapeutic drug level monitoring: Secondary | ICD-10-CM

## 2023-01-11 LAB — POCT INR: INR: 1.7 — AB (ref 2.0–3.0)

## 2023-01-11 NOTE — Patient Instructions (Addendum)
Description   Today take 1.5 tablets of warfarin and then START taking warfarin 1 tablet daily.  Stay consistent with greens (2-3 times per week).   Recheck INR in 2 weeks (normally 6 weeks).  Coumadin Clinic [phone]. HAVE DENTAL OFFICE FAX CLEARANCE FORM TO CARDIOLOGY OFFICE.

## 2023-01-17 NOTE — Progress Notes (Signed)
Electrophysiology Office Note Date: 01/18/2023  ID:  Brandon [name], Brandon [name] 1946-05-05, MRN [id number]  PCP: [facility] Primary Cardiologist: None Electrophysiologist: Will Jorja Loa, MD   CC: Pacemaker follow-up  Brandon [name] is a 77 y.o. male with h/o HFrecEF, CVA, CAD, CHB s/p MDT PPM, AF, AFL, and Atrial tachycardia seen today for Will Jorja Loa, MD for routine electrophysiology followup. Since last being seen in our clinic the patient reports doing well from a cardiac perspective.  he denies chest pain, palpitations, dyspnea, PND, orthopnea, nausea, vomiting, dizziness, syncope, edema, weight gain, or early satiety.   Device History: Medtronic Dual Chamber PPM implanted 2018 for CHB  Past Medical History:  Diagnosis Date   Anemia 07/13/2017   Anxiety    Aortic insufficiency    Aortic stenosis, moderate 07/13/2017   Arthritis    back    Carotid stenosis    Right carotid stent (widely patent) 40 - 59% left plaque 11/13   CHF (congestive heart failure) (HCC)    Chronic kidney disease    COPD (chronic obstructive pulmonary disease) (HCC)    Coronary artery disease involving native coronary artery of native heart without angina pectoris    Depression    Dyslipidemia    GERD (gastroesophageal reflux disease)    Heart murmur    Hemiplegia affecting unspecified side, late effect of cerebrovascular disease    resolved- from L side    Hypertension    Jaundice    resolved following ERCP & Cholecystectomy   Mild emphysema (HCC)    Mitral regurgitation    Mitral valve insufficiency and aortic valve insufficiency    Myocardial infarction (HCC)    Paroxysmal atrial fibrillation (HCC)    Pre-diabetes    per spouse   S/P aortic valve replacement with bioprosthetic valve 11/16/2017   23 mm Edwards Magna Ease stented bovine pericardial tissue valve   S/P CABG x 1 11/16/2017   SVG to PDA with [facility] via right thigh   S/P Maze operation for atrial  fibrillation 11/16/2017   Left side lesion set using bipolar radiofrequency and cryothermy ablation with clipping of LA appendage   S/P mitral valve replacement with bioprosthetic valve 11/16/2017   27 mm [facility] Mitral stented bovine pericardial tissue valve   Sleep apnea    does not wear CPAP   Sleep concern    resulted in surgery- after + sleep test. Pt. doesn't have a problem any longer.    Stroke (HCC) 03/11/2003   stent placed on the 31, 3, 2004, L side    Wears glasses    Wears hearing aid in both ears    Wears partial dentures     Current Outpatient Medications  Medication Instructions   Accu-Chek Softclix Lancets lancets Use as directed to check blood sugar daily and as needed   acetaminophen (TYLENOL) 1,000 mg, Oral, Every 6 hours PRN   amiodarone (PACERONE) 200 mg, Oral, Daily   Azelastine HCl 137 MCG/SPRAY SOLN Use 1 spray in each nostril 2 times a day   Blood Glucose Monitoring Suppl (ACCU-CHEK GUIDE) w/Device KIT check your sugar once daily and as needed as directed   calcium carbonate (TUMS - DOSED IN MG ELEMENTAL CALCIUM) 500 MG chewable tablet 1 tablet, Oral, Daily PRN   carvedilol (COREG) 6.25 mg, Oral, 2 times daily   cetirizine (ZYRTEC) 10 mg, Oral, Daily PRN   citalopram (CELEXA) 20 mg, Oral, Daily at bedtime   docusate sodium (COLACE) 100  mg, Oral, 3 times daily PRN   empagliflozin (JARDIANCE) 10 MG TABS tablet TAKE 1 TABLET BY MOUTH ONCE A DAY BEFORE BREAKFAST   fluticasone (FLONASE) 50 MCG/ACT nasal spray Each Nare, Daily PRN   furosemide (LASIX) 20 mg, Oral, Daily   glucose blood (ACCU-CHEK GUIDE) test strip Use as directed to check blood sugar once daily and as needed   loratadine (CLARITIN) 10 mg, Oral, Daily PRN   methocarbamol (ROBAXIN) 500 mg, Oral, Every 8 hours PRN   pantoprazole (PROTONIX) 40 mg, Oral, Daily   Pseudoeph-Doxylamine-DM-APAP (NYQUIL PO) 30 mLs, Oral, Daily PRN   rosuvastatin (CRESTOR) 10 MG tablet TAKE 1 TABLET EVERY DAY    sacubitril-valsartan (ENTRESTO) 24-26 MG 1 tablet, Oral, 2 times daily   spironolactone (ALDACTONE) 25 MG tablet TAKE 1 TABLET EVERY DAY   traZODone (DESYREL) 50 mg, Oral, Daily at bedtime   warfarin (COUMADIN) 2.5 MG tablet TAKE 1/2 TO 1 TABLET ONE TIME DAILY AS DIRECTED BY COUMADIN CLINIC . DOSE CHANGE    Family History: Family History  Problem Relation Age of Onset   Stroke Father        No details   Angina Mother    Kidney cancer Brother     Physical Exam: Vitals:   01/18/23 1153  BP: 126/72  Pulse: (!) 59  SpO2: 96%  Weight: 245 lb (111.1 kg)  Height: 5\' 11"  (1.803 m)     GEN- NAD. A&O x 3. Normal affect HEENT: Normocephalic, atraumatic Lungs- CTAB, Normal effort.  Heart- Regular rate and rhythm rate and rhythm. No M/G/R.  Extremities- No peripheral edema. no clubbing or cyanosis Skin- warm and dry, no rash or lesion, PPM pocket well healed.  PPM Interrogation-  reviewed in detail today,  See PACEART report.  EKG is not ordered today  Other studies Reviewed: Additional studies/ records that were reviewed today include: Previous EP office notes, Previous remote checks, Most recent labwork.   Assessment and Plan:  1. CHB s/p Medtronic PPM  Normal PPM function See Pace Art report No changes today  2. Persistent atrial fibrillation  3. Atypical atrial flutter S/p repeat ablation 12/16/2021.  He has had surgical MAZE and LAA clipping in the past Continue amiodarone 200 mg daily. Labs today.  Has also had atrial tachycardia at a cycle length of ~570 ms; Historically have been able to pace out at 280-300 ms.   4. VHD Stable Mitral and AVR on TEE  5. HFrecEF TEE 06/2021 with EF improved to 50-55%. Pending TTE per Dr. Shirlee Latch Continue GDMT.   Current medicines are reviewed at length with the patient today.    Disposition:   Follow up with EP APP in 6 months   Signed, Graciella Freer, PA-C  01/18/2023 11:59 AM  [facility] [address] [place] [address] [phone] (office) [phone] (fax)c

## 2023-01-18 ENCOUNTER — Ambulatory Visit: Payer: Medicare HMO | Attending: Student | Admitting: Student

## 2023-01-18 ENCOUNTER — Encounter: Payer: Self-pay | Admitting: Student

## 2023-01-18 VITALS — BP 126/72 | HR 59 | Ht 71.0 in | Wt 245.0 lb

## 2023-01-18 DIAGNOSIS — Z950 Presence of cardiac pacemaker: Secondary | ICD-10-CM

## 2023-01-18 DIAGNOSIS — I2510 Atherosclerotic heart disease of native coronary artery without angina pectoris: Secondary | ICD-10-CM

## 2023-01-18 DIAGNOSIS — Z8679 Personal history of other diseases of the circulatory system: Secondary | ICD-10-CM | POA: Diagnosis not present

## 2023-01-18 DIAGNOSIS — I442 Atrioventricular block, complete: Secondary | ICD-10-CM | POA: Diagnosis not present

## 2023-01-18 DIAGNOSIS — Z9889 Other specified postprocedural states: Secondary | ICD-10-CM | POA: Diagnosis not present

## 2023-01-18 DIAGNOSIS — I480 Paroxysmal atrial fibrillation: Secondary | ICD-10-CM

## 2023-01-18 DIAGNOSIS — Z953 Presence of xenogenic heart valve: Secondary | ICD-10-CM

## 2023-01-18 DIAGNOSIS — Z951 Presence of aortocoronary bypass graft: Secondary | ICD-10-CM

## 2023-01-18 LAB — CUP PACEART INCLINIC DEVICE CHECK
Battery Remaining Longevity: 102 mo
Battery Voltage: 3 V
Brady Statistic AP VP Percent: 0.19 %
Brady Statistic AP VS Percent: 98.57 %
Brady Statistic AS VP Percent: 0.01 %
Brady Statistic AS VS Percent: 1.23 %
Brady Statistic RA Percent Paced: 98.76 %
Brady Statistic RV Percent Paced: 0.2 %
Date Time Interrogation Session: 20240207121651
Implantable Lead Connection Status: 753985
Implantable Lead Connection Status: 753985
Implantable Lead Implant Date: 20181211
Implantable Lead Implant Date: 20181211
Implantable Lead Location: 753859
Implantable Lead Location: 753860
Implantable Lead Model: 3830
Implantable Lead Model: 5076
Implantable Pulse Generator Implant Date: 20181211
Lead Channel Impedance Value: 304 Ohm
Lead Channel Impedance Value: 361 Ohm
Lead Channel Impedance Value: 437 Ohm
Lead Channel Impedance Value: 494 Ohm
Lead Channel Pacing Threshold Amplitude: 0.625 V
Lead Channel Pacing Threshold Amplitude: 1.375 V
Lead Channel Pacing Threshold Pulse Width: 0.4 ms
Lead Channel Pacing Threshold Pulse Width: 0.4 ms
Lead Channel Sensing Intrinsic Amplitude: 3.125 mV
Lead Channel Sensing Intrinsic Amplitude: 3.125 mV
Lead Channel Sensing Intrinsic Amplitude: 5.75 mV
Lead Channel Sensing Intrinsic Amplitude: 5.75 mV
Lead Channel Setting Pacing Amplitude: 2 V
Lead Channel Setting Pacing Amplitude: 2.5 V
Lead Channel Setting Pacing Pulse Width: 0.7 ms
Lead Channel Setting Sensing Sensitivity: 1.2 mV
Zone Setting Status: 755011
Zone Setting Status: 755011

## 2023-01-18 NOTE — Patient Instructions (Signed)
Medication Instructions:  Your physician recommends that you continue on your current medications as directed. Please refer to the Current Medication list given to you today.  *If you need a refill on your cardiac medications before your next appointment, please call your pharmacy*   Lab Work: None If you have labs (blood work) drawn today and your tests are completely normal, you will receive your results only by: [place] (if you have MyChart) OR A paper copy in the mail If you have any lab test that is abnormal or we need to change your treatment, we will call you to review the results.   Follow-Up: At [facility], you and your health needs are our priority.  As part of our continuing mission to provide you with exceptional heart care, we have created designated Provider Care Teams.  These Care Teams include your primary Cardiologist (physician) and Advanced Practice Providers (APPs -  Physician Assistants and Nurse Practitioners) who all work together to provide you with the care you need, when you need it.   Your next appointment:   6 month(s)  Provider:   Legrand Como "Oda Kilts, PA-C

## 2023-01-25 ENCOUNTER — Ambulatory Visit: Payer: Medicare HMO | Attending: Internal Medicine | Admitting: *Deleted

## 2023-01-25 DIAGNOSIS — Z951 Presence of aortocoronary bypass graft: Secondary | ICD-10-CM

## 2023-01-25 DIAGNOSIS — Z9889 Other specified postprocedural states: Secondary | ICD-10-CM

## 2023-01-25 DIAGNOSIS — I480 Paroxysmal atrial fibrillation: Secondary | ICD-10-CM | POA: Diagnosis not present

## 2023-01-25 DIAGNOSIS — Z5181 Encounter for therapeutic drug level monitoring: Secondary | ICD-10-CM

## 2023-01-25 DIAGNOSIS — Z953 Presence of xenogenic heart valve: Secondary | ICD-10-CM

## 2023-01-25 DIAGNOSIS — Z8679 Personal history of other diseases of the circulatory system: Secondary | ICD-10-CM | POA: Diagnosis not present

## 2023-01-25 LAB — POCT INR: INR: 1.7 — AB (ref 2.0–3.0)

## 2023-01-25 NOTE — Patient Instructions (Signed)
Description   Today take 1.5 tablets of warfarin and then START taking warfarin 1 tablet daily except 1.5 tablets on Sundays. Stay consistent with greens (2-3 times per week).   Recheck INR in 2 weeks (normally 6 weeks).  Coumadin Clinic [phone]. HAVE DENTAL OFFICE FAX CLEARANCE FORM TO CARDIOLOGY OFFICE.

## 2023-01-28 ENCOUNTER — Other Ambulatory Visit: Payer: Self-pay | Admitting: Cardiology

## 2023-02-06 ENCOUNTER — Other Ambulatory Visit (HOSPITAL_COMMUNITY): Payer: Self-pay

## 2023-02-06 ENCOUNTER — Other Ambulatory Visit (HOSPITAL_COMMUNITY): Payer: Self-pay | Admitting: *Deleted

## 2023-02-06 ENCOUNTER — Telehealth (HOSPITAL_COMMUNITY): Payer: Self-pay | Admitting: Pharmacy Technician

## 2023-02-06 MED ORDER — ENTRESTO 24-26 MG PO TABS
1.0000 | ORAL_TABLET | Freq: Two times a day (BID) | ORAL | 3 refills | Status: DC
  Filled 2023-02-06: qty 180, 90d supply, fill #0
  Filled 2023-05-03: qty 180, 90d supply, fill #1
  Filled 2023-08-02: qty 180, 90d supply, fill #2
  Filled 2023-10-31: qty 180, 90d supply, fill #3

## 2023-02-06 NOTE — Telephone Encounter (Signed)
Advanced Heart Failure Patient Advocate Encounter  Patient's wife called and requested a refill of Entresto to [facility]. Sent 90 day RX request to [facility] ([facility] to send to [facility].  Charlann Boxer, CPhT

## 2023-02-07 ENCOUNTER — Other Ambulatory Visit (HOSPITAL_COMMUNITY): Payer: Self-pay

## 2023-02-08 ENCOUNTER — Ambulatory Visit: Payer: Medicare HMO | Attending: Cardiovascular Disease

## 2023-02-08 DIAGNOSIS — Z5181 Encounter for therapeutic drug level monitoring: Secondary | ICD-10-CM | POA: Diagnosis not present

## 2023-02-08 DIAGNOSIS — Z953 Presence of xenogenic heart valve: Secondary | ICD-10-CM | POA: Diagnosis not present

## 2023-02-08 LAB — POCT INR: INR: 2.4 (ref 2.0–3.0)

## 2023-02-08 NOTE — Patient Instructions (Signed)
Description   Continue taking warfarin 1 tablet daily except 1.5 tablets on Sundays.  Stay consistent with greens (2-3 times per week).   Recheck INR in 4 weeks (normally 6 weeks).  Coumadin Clinic [phone]. HAVE DENTAL OFFICE FAX CLEARANCE FORM TO CARDIOLOGY OFFICE.

## 2023-02-14 ENCOUNTER — Other Ambulatory Visit: Payer: Self-pay | Admitting: Cardiology

## 2023-02-14 NOTE — Telephone Encounter (Signed)
Refill request for warfarin:  Last INR was 2.4 on 02/08/23 Next INR due 03/08/23 LOV was 01/18/23  Claudina Lick PA-C  Refill approved.

## 2023-02-27 ENCOUNTER — Encounter: Payer: Self-pay | Admitting: Podiatry

## 2023-02-27 ENCOUNTER — Ambulatory Visit (INDEPENDENT_AMBULATORY_CARE_PROVIDER_SITE_OTHER): Payer: Medicare HMO | Admitting: Podiatry

## 2023-02-27 DIAGNOSIS — M79674 Pain in right toe(s): Secondary | ICD-10-CM | POA: Diagnosis not present

## 2023-02-27 DIAGNOSIS — M79675 Pain in left toe(s): Secondary | ICD-10-CM

## 2023-02-27 DIAGNOSIS — B351 Tinea unguium: Secondary | ICD-10-CM | POA: Diagnosis not present

## 2023-02-27 DIAGNOSIS — Z7901 Long term (current) use of anticoagulants: Secondary | ICD-10-CM

## 2023-02-27 NOTE — Progress Notes (Signed)
This patient returns to my office for at risk foot care.  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  This patient is unable to cut nails himself since the patient cannot reach his nails.These nails are painful walking and wearing shoes.  This patient presents for at risk foot care today.  General Appearance  Alert, conversant and in no acute stress.  Vascular  Dorsalis pedis and posterior tibial  pulses are palpable  bilaterally.  Capillary return is within normal limits  bilaterally. Temperature is within normal limits  bilaterally.  Neurologic  Senn-Weinstein monofilament wire test within normal limits  bilaterally. Muscle power within normal limits bilaterally.  Nails Thick disfigured discolored nails with subungual debris  from hallux to fifth toes bilaterally. No evidence of bacterial infection or drainage bilaterally.  Orthopedic  No limitations of motion  feet .  No crepitus or effusions noted.  No bony pathology or digital deformities noted.  Skin  normotropic skin with no porokeratosis noted bilaterally.  No signs of infections or ulcers noted.     Onychomycosis  Pain in right toes  Pain in left toes  Consent was obtained for treatment procedures.   Mechanical debridement of nails 1-5  bilaterally performed with a nail nipper.  Filed with dremel without incident.    Return office visit    3 months                  Told patient to return for periodic foot care and evaluation due to potential at risk complications.   Khyla Mccumbers DPM   

## 2023-03-02 ENCOUNTER — Ambulatory Visit (INDEPENDENT_AMBULATORY_CARE_PROVIDER_SITE_OTHER): Payer: Medicare HMO

## 2023-03-02 DIAGNOSIS — I442 Atrioventricular block, complete: Secondary | ICD-10-CM

## 2023-03-02 LAB — CUP PACEART REMOTE DEVICE CHECK
Battery Remaining Longevity: 99 mo
Battery Voltage: 2.99 V
Brady Statistic AP VP Percent: 6.7 %
Brady Statistic AP VS Percent: 92.43 %
Brady Statistic AS VP Percent: 0.01 %
Brady Statistic AS VS Percent: 0.86 %
Brady Statistic RA Percent Paced: 99.18 %
Brady Statistic RV Percent Paced: 6.71 %
Date Time Interrogation Session: 20240320212620
Implantable Lead Connection Status: 753985
Implantable Lead Connection Status: 753985
Implantable Lead Implant Date: 20181211
Implantable Lead Implant Date: 20181211
Implantable Lead Location: 753859
Implantable Lead Location: 753860
Implantable Lead Model: 3830
Implantable Lead Model: 5076
Implantable Pulse Generator Implant Date: 20181211
Lead Channel Impedance Value: 285 Ohm
Lead Channel Impedance Value: 323 Ohm
Lead Channel Impedance Value: 380 Ohm
Lead Channel Impedance Value: 418 Ohm
Lead Channel Pacing Threshold Amplitude: 0.625 V
Lead Channel Pacing Threshold Amplitude: 1.375 V
Lead Channel Pacing Threshold Pulse Width: 0.4 ms
Lead Channel Pacing Threshold Pulse Width: 0.4 ms
Lead Channel Sensing Intrinsic Amplitude: 4 mV
Lead Channel Sensing Intrinsic Amplitude: 4 mV
Lead Channel Sensing Intrinsic Amplitude: 6 mV
Lead Channel Sensing Intrinsic Amplitude: 6 mV
Lead Channel Setting Pacing Amplitude: 2 V
Lead Channel Setting Pacing Amplitude: 2.5 V
Lead Channel Setting Pacing Pulse Width: 0.7 ms
Lead Channel Setting Sensing Sensitivity: 1.2 mV
Zone Setting Status: 755011
Zone Setting Status: 755011

## 2023-03-08 ENCOUNTER — Ambulatory Visit: Payer: Medicare HMO | Attending: Internal Medicine

## 2023-03-08 DIAGNOSIS — Z953 Presence of xenogenic heart valve: Secondary | ICD-10-CM | POA: Diagnosis not present

## 2023-03-08 DIAGNOSIS — Z5181 Encounter for therapeutic drug level monitoring: Secondary | ICD-10-CM | POA: Diagnosis not present

## 2023-03-08 DIAGNOSIS — Z8679 Personal history of other diseases of the circulatory system: Secondary | ICD-10-CM

## 2023-03-08 DIAGNOSIS — Z951 Presence of aortocoronary bypass graft: Secondary | ICD-10-CM

## 2023-03-08 DIAGNOSIS — I480 Paroxysmal atrial fibrillation: Secondary | ICD-10-CM

## 2023-03-08 LAB — POCT INR: INR: 2.3 (ref 2.0–3.0)

## 2023-03-08 NOTE — Patient Instructions (Signed)
Description   Continue taking warfarin 1 tablet daily except 1.5 tablets on Sundays.  Stay consistent with greens (2-3 times per week).   Recheck INR in 6 weeks.  Coumadin Clinic [phone]. HAVE DENTAL OFFICE FAX CLEARANCE FORM TO CARDIOLOGY OFFICE.

## 2023-03-25 ENCOUNTER — Other Ambulatory Visit (HOSPITAL_COMMUNITY): Payer: Self-pay

## 2023-04-05 NOTE — Progress Notes (Signed)
Remote pacemaker transmission.   

## 2023-04-11 DIAGNOSIS — H5203 Hypermetropia, bilateral: Secondary | ICD-10-CM | POA: Diagnosis not present

## 2023-04-18 ENCOUNTER — Ambulatory Visit (HOSPITAL_COMMUNITY)
Admission: RE | Admit: 2023-04-18 | Discharge: 2023-04-18 | Disposition: A | Discharge status: Home / Self Care | Payer: Medicare HMO | Source: Ambulatory Visit | Attending: Cardiology | Admitting: Cardiology

## 2023-04-18 ENCOUNTER — Encounter (HOSPITAL_COMMUNITY): Payer: Self-pay | Admitting: Cardiology

## 2023-04-18 ENCOUNTER — Ambulatory Visit (HOSPITAL_BASED_OUTPATIENT_CLINIC_OR_DEPARTMENT_OTHER)
Admission: RE | Admit: 2023-04-18 | Discharge: 2023-04-18 | Disposition: A | Discharge status: Home / Self Care | Payer: Medicare HMO | Source: Ambulatory Visit | Attending: Cardiology | Admitting: Cardiology

## 2023-04-18 VITALS — BP 110/70 | HR 60 | Wt 250.2 lb

## 2023-04-18 DIAGNOSIS — Z7901 Long term (current) use of anticoagulants: Secondary | ICD-10-CM | POA: Insufficient documentation

## 2023-04-18 DIAGNOSIS — Z79899 Other long term (current) drug therapy: Secondary | ICD-10-CM | POA: Diagnosis not present

## 2023-04-18 DIAGNOSIS — I2510 Atherosclerotic heart disease of native coronary artery without angina pectoris: Secondary | ICD-10-CM | POA: Diagnosis not present

## 2023-04-18 DIAGNOSIS — Z951 Presence of aortocoronary bypass graft: Secondary | ICD-10-CM | POA: Diagnosis not present

## 2023-04-18 DIAGNOSIS — D509 Iron deficiency anemia, unspecified: Secondary | ICD-10-CM | POA: Insufficient documentation

## 2023-04-18 DIAGNOSIS — N183 Chronic kidney disease, stage 3 unspecified: Secondary | ICD-10-CM | POA: Insufficient documentation

## 2023-04-18 DIAGNOSIS — I5022 Chronic systolic (congestive) heart failure: Secondary | ICD-10-CM

## 2023-04-18 DIAGNOSIS — I080 Rheumatic disorders of both mitral and aortic valves: Secondary | ICD-10-CM | POA: Insufficient documentation

## 2023-04-18 DIAGNOSIS — Z7984 Long term (current) use of oral hypoglycemic drugs: Secondary | ICD-10-CM | POA: Insufficient documentation

## 2023-04-18 DIAGNOSIS — I4819 Other persistent atrial fibrillation: Secondary | ICD-10-CM

## 2023-04-18 DIAGNOSIS — E785 Hyperlipidemia, unspecified: Secondary | ICD-10-CM | POA: Insufficient documentation

## 2023-04-18 DIAGNOSIS — G4733 Obstructive sleep apnea (adult) (pediatric): Secondary | ICD-10-CM | POA: Diagnosis not present

## 2023-04-18 DIAGNOSIS — J449 Chronic obstructive pulmonary disease, unspecified: Secondary | ICD-10-CM | POA: Diagnosis not present

## 2023-04-18 DIAGNOSIS — Z87891 Personal history of nicotine dependence: Secondary | ICD-10-CM | POA: Diagnosis not present

## 2023-04-18 DIAGNOSIS — E669 Obesity, unspecified: Secondary | ICD-10-CM | POA: Diagnosis not present

## 2023-04-18 DIAGNOSIS — J849 Interstitial pulmonary disease, unspecified: Secondary | ICD-10-CM | POA: Insufficient documentation

## 2023-04-18 DIAGNOSIS — Z823 Family history of stroke: Secondary | ICD-10-CM | POA: Insufficient documentation

## 2023-04-18 DIAGNOSIS — Z6834 Body mass index (BMI) 34.0-34.9, adult: Secondary | ICD-10-CM | POA: Diagnosis not present

## 2023-04-18 DIAGNOSIS — I130 Hypertensive heart and chronic kidney disease with heart failure and stage 1 through stage 4 chronic kidney disease, or unspecified chronic kidney disease: Secondary | ICD-10-CM | POA: Diagnosis not present

## 2023-04-18 DIAGNOSIS — Z953 Presence of xenogenic heart valve: Secondary | ICD-10-CM | POA: Diagnosis not present

## 2023-04-18 DIAGNOSIS — Z8673 Personal history of transient ischemic attack (TIA), and cerebral infarction without residual deficits: Secondary | ICD-10-CM | POA: Insufficient documentation

## 2023-04-18 DIAGNOSIS — I480 Paroxysmal atrial fibrillation: Secondary | ICD-10-CM | POA: Diagnosis not present

## 2023-04-18 DIAGNOSIS — I5042 Chronic combined systolic (congestive) and diastolic (congestive) heart failure: Secondary | ICD-10-CM | POA: Diagnosis not present

## 2023-04-18 DIAGNOSIS — I4892 Unspecified atrial flutter: Secondary | ICD-10-CM | POA: Diagnosis not present

## 2023-04-18 HISTORY — DX: Type 2 diabetes mellitus without complications: E11.9

## 2023-04-18 LAB — COMPREHENSIVE METABOLIC PANEL
ALT: 24 U/L (ref 0–44)
AST: 26 U/L (ref 15–41)
Albumin: 3.9 g/dL (ref 3.5–5.0)
Alkaline Phosphatase: 32 U/L — ABNORMAL LOW (ref 38–126)
Anion gap: 11 (ref 5–15)
BUN: 35 mg/dL — ABNORMAL HIGH (ref 8–23)
CO2: 23 mmol/L (ref 22–32)
Calcium: 8.7 mg/dL — ABNORMAL LOW (ref 8.9–10.3)
Chloride: 102 mmol/L (ref 98–111)
Creatinine, Ser: 2.34 mg/dL — ABNORMAL HIGH (ref 0.61–1.24)
GFR, Estimated: 28 mL/min — ABNORMAL LOW (ref >60)
Glucose, Bld: 86 mg/dL (ref 70–99)
Potassium: 5 mmol/L (ref 3.5–5.1)
Sodium: 136 mmol/L (ref 135–145)
Total Bilirubin: 0.4 mg/dL (ref 0.3–1.2)
Total Protein: 7.1 g/dL (ref 6.5–8.1)

## 2023-04-18 LAB — ECHOCARDIOGRAM COMPLETE
AR max vel: 1.26 cm2
AV Area VTI: 1.28 cm2
AV Area mean vel: 1.3 cm2
AV Mean grad: 9.3 mmHg
AV Peak grad: 18.4 mmHg
Ao pk vel: 2.15 m/s
Area-P 1/2: 2.75 cm2
Calc EF: 58.8 %
MV VTI: 1.05 cm2
S' Lateral: 3.6 cm
Single Plane A2C EF: 60.7 %
Single Plane A4C EF: 56.5 %

## 2023-04-18 LAB — CBC
HCT: 44.3 % (ref 39.0–52.0)
Hemoglobin: 14.4 g/dL (ref 13.0–17.0)
MCH: 33.2 pg (ref 26.0–34.0)
MCHC: 32.5 g/dL (ref 30.0–36.0)
MCV: 102.1 fL — ABNORMAL HIGH (ref 80.0–100.0)
Platelets: 155 10*3/uL (ref 150–400)
RBC: 4.34 MIL/uL (ref 4.22–5.81)
RDW: 13.1 % (ref 11.5–15.5)
WBC: 6.4 10*3/uL (ref 4.0–10.5)
nRBC: 0 % (ref 0.0–0.2)

## 2023-04-18 LAB — BRAIN NATRIURETIC PEPTIDE: B Natriuretic Peptide: 101.6 pg/mL — ABNORMAL HIGH (ref 0.0–100.0)

## 2023-04-18 LAB — TSH: TSH: 2.73 u[IU]/mL (ref 0.350–4.500)

## 2023-04-18 NOTE — Patient Instructions (Signed)
STOP Carvedilol  Labs done today, your results will be available in MyChart, we will contact you for abnormal readings.  Your physician recommends that you schedule a follow-up appointment in: 4 months  If you have any questions or concerns before your next appointment please send [place] a message through [place] or call our office at [phone].    TO LEAVE A MESSAGE FOR THE NURSE SELECT OPTION 2, PLEASE LEAVE A MESSAGE INCLUDING: YOUR NAME DATE OF BIRTH CALL BACK NUMBER REASON FOR CALL**this is important as we prioritize the call backs  YOU WILL RECEIVE A CALL BACK THE SAME DAY AS LONG AS YOU CALL BEFORE 4:00 PM  At the Advanced Heart Failure Clinic, you and your health needs are our priority. As part of our continuing mission to provide you with exceptional heart care, we have created designated Provider Care Teams. These Care Teams include your primary Cardiologist (physician) and Advanced Practice Providers (APPs- Physician Assistants and Nurse Practitioners) who all work together to provide you with the care you need, when you need it.   You may see any of the following providers on your designated Care Team at your next follow up: Dr Arvilla Meres Dr Marca Ancona Dr. Marcos Eke, NP Robbie Lis, [place] [facility] [place], [place] Brynda Peon, NP Karle Plumber, PharmD   Please be sure to bring in all your medications bottles to every appointment.    Thank you for choosing [facility]

## 2023-04-19 ENCOUNTER — Ambulatory Visit: Payer: Medicare HMO | Attending: Cardiology

## 2023-04-19 DIAGNOSIS — Z953 Presence of xenogenic heart valve: Secondary | ICD-10-CM

## 2023-04-19 DIAGNOSIS — Z5181 Encounter for therapeutic drug level monitoring: Secondary | ICD-10-CM

## 2023-04-19 LAB — POCT INR: INR: 2.1 (ref 2.0–3.0)

## 2023-04-19 NOTE — Patient Instructions (Signed)
Description   Continue taking warfarin 1 tablet daily except 1.5 tablets on Sundays.  Stay consistent with greens (2-3 times per week).   Recheck INR in 6 weeks.  Coumadin Clinic [phone]. HAVE DENTAL OFFICE FAX CLEARANCE FORM TO CARDIOLOGY OFFICE.     

## 2023-04-19 NOTE — Progress Notes (Signed)
Advanced Heart Failure Clinic Note   PCP: Dr. Hyman Hopes Cardiology: Dr. Mayford Knife HF Cardiology: Dr. Shirlee Latch  77 y.o.with history of CVA, paroxysmal atrial fibrillation, valvular heart disease, and chronic diastolic CHF presents for evaluation of CHF and valvular heart disease.    He had had episodes of dyspnea and initial workup led to a TEE in 5/18 showing normal EF with moderate AS and moderate MR.  He continued to have episodic dyspnea and ended up admitted in 8/18 with shortness of breath and chest pressure.  This led to a long, complicated hospitalization.  He was noted to be volume overloaded with acute diastolic CHF and was also noted to have symptomatic runs of atrial fibrillation with RVR.  Amiodarone was started to control the atrial fibrillation.  He developed respiratory distress => Bipap => intubated.  He became hypotensive, requiring pressors.  He developed AKI.  PNA was noted and he received broad spectrum abx.  He had a prolonged intubation but was eventually extubated.  Given deconditioning from long hospitalization, he went to CIR for a couple of weeks.  LHC in 9/18 showed occluded PDA with left>right collaterals.  TEE in 9/18 showed EF 50%, severe MR (possibly infarct-related with restricted posterior leaflet, and moderate AS + moderate-severe AI (possibly rheumatic).   In 12/18, he had cardiac surgery with bioprosthetic aortic and mitral valves placed.  He had SVG-PDA, Maze, and LA appendage clipping.  Post-op course was complicated by CHB requiring Medtronic dual chamber PPM (His bundle lead placed but captured right bundle so induced LBBB).   Echo 01/02/18 LVEF 30-35%, bioprosthetic MV and AoV function normally, RV mildly dilated with normal systolic function.    He was noted to be in atrial flutter with mild RVR and had TEE-guided DCCV on 04/11/18. TEE showed EF 25-30%, normal bioprosthetic aortic and mitral valves.  Echo in 6/19 showed EF 30-35%, normal bioprosthetic aortic and mitral  valves.  Echo 12/19 with EF up to 50-55%, normal bioprosthetic mitral and aortic valves, normal RV.   In 2020, he went back into atrial fibrillation.  Amiodarone was started, and he returned to NSR.   Echo in 11/21 showed EF 50-55%, mild LVH, mildly dilated RV with normal function, bioprosthetic mitral valve with mean gradient 5 mmHg and no MR, bioprosthetic aortic valve with mean gradient 12 and no AI.  PFTs in 1/22 were restrictive.  High resolution CT chest was done in 2/22, concerning for ILD, usual interstitial pneumonitis.  He saw pulmonary, ESR was mildly elevated.  We decided to try him off amiodarone given concern for possible amiodarone-induced lung toxicity.  He then developed atrial fibrillation off amiodarone, was quite symptomatic.  He underwent TEE-guided DCCV back to NSR in 7/22 and amiodarone was restarted.  TEE showed EF 50-55%, mid septal HK, RV normal, bioprosthetic aortic and mitral valves appeared normal.   EP tried decreasing amiodarone to 100 mg daily.  However, patient then developed runs of atrial tachycardia (had to be paced out).  Amiodarone was increased back to 200 mg daily.   He had HRCT chest in 1/24, no clear ILD.  Saw Dr Isaiah Serge with pulmonary, though it was ok to continue amiodarone.   Echo was done today and reviewed, EF 50-55% with normal RV, PASP 33 mmHg, s/p bioprosthetic MVR (mean gradient 4 mmHg, no MR), s/p bioprosthetic AVR (mean gradient 9, no AI).   Today he returns for HF follow up.  Weight is up 6 lbs.  Not following diet.  Mild dyspnea with  long walks but more limited by knee pain.  No chest pain. Has been sleeping in his recliner for a long time.  No PND.  He has not been able to tolerate CPAP.  No lightheadedness or palpitations.   ECG (personally reviewed): a-paced with long PR interval, IVCD 136 msec  Device interrogation (personally reviewed): 99% a-paced, 4.9% v-paced.   Labs (9/18): K 4.5, creatinine 1.42, LDL 90, HDL 36, LFTs normal, TSH  normal, hgb 9.6 Labs (10/18): K 4.3, creatinine 1.45 Labs (12/18): K 3.8, creatinine 1.56 Labs (1/19): creatinine 1.68, LDL 66 Labs (3/19): K 4.2, creatinine 1.56 Labs (4/19): K 4.4, creatinine 1.71, hgb 11.8, LDL 75, HDL 43 Labs (6/19): K 4.8, creatinine 2.1, LDL 75, TGs 246 Labs (8/19): K 4.6, creatinine 2.07 Labs (11/19): K 4.6, creatinine 1.29 Labs (2/20): K 4.8, creatinine 1.98 Labs (5/20): LDL 49, HDL 39 Labs (7/20): TSH normal Labs (9/20): TSH normal, K 4.4, creatinine 2.03, hgb 13.5, LFTs normal Labs (12/20): K 4.4, creatinine 1.87, hgb 13.1.  Labs (11/21): LDL 54, K 4.6, creatinine 1.61, LFTs normal, TSH normal Labs (7/22): TSH normal, K 4.5, creatinine 2.98, hgb 13.7 Labs (8/22): K 4.4, creatinine 2.23 Labs (2/23): BNP 299 Labs (3/23): K 3.9, creatinine 2.21 Labs (5/23): K 4.4, creatinine 2.29 Labs (8/23): K 4.3, creatinine 2.37 Labs (12/23): LDL 51 Labs (1/24): K 4.8, creatinine 2.14  PMH: 1. CVA: Right MCA, 2004.  Minimal residual problems.  2. HTN - Peripheral edema with amlodipine.  3. Hyperlipidemia 4. Left rotator cuff surgery 6/18 5. Carotid stenosis: s/p right carotid stent.  6. GERD 7. H/o CCY 8. Anemia: FOBT negative.  9. Gout  10. Atrial fibrillation: Paroxysmal. Maze in 12/18 with LAA clipping.  - Recurrent atrial fibrillation 2020 => amiodarone.  - Recurrent atrial fibrillation off amiodarone in 7/22, DCCV to NSR and amiodarone restarted.  11. Valvular heart disease: TEE (5/18) with EF 55-60%, moderate AS mean 33 mmHg, moderate MR, normal RV size and systolic function.  - Echo (8/18): EF 55-60%, moderate LVH, moderate AS with mean gradient 33 mmHg, moderate AI, moderate to severe MR.  - TEE (9/18): EF 50%, mild LV dilation, suspect severe MR with posterior leaflet restricted (?infarct-related MR), ERO 0.4 cm^2, moderate AS/moderate-severe AI (possibly rheumatic).   - In 12/18, he had cardiac surgery with bioprosthetic aortic and mitral valves placed.   He had SVG-PDA, Maze, and LA appendage clipping. - Echo (1/19): LVEF 30-35%, bioprosthetic MV and AoV function normally, RV mildly dilated with normal systolic function.  - TEE (5/19) with EF 25-30%, normal bioprosthetic aortic valve, normal bioprosthetic mitral valve. - Echo (6/19): EF 30-35%, mild LV dilation with diffuse hypokinesis, normal RV size with mildly decreased systolic function, normal-appearing bioprosthetic mitral and aortic valves.   - Echo (12/19): EF 50-55%, moderate LVH, bioprosthetic aortic valve with mean gradient 10 mmHg, normally-functioning bioprosthetic mitral valve.  - Echo (11/21): EF 50-55%, mild LVH, mildly dilated RV with normal function, bioprosthetic mitral valve with mean gradient 5 mmHg and no MR, bioprosthetic aortic valve with mean gradient 12 and no AI.   - TEE (7/22): EF 50-55%, septal HK, normal RV size/systolic function, LA appendage clipped, bioprosthetic aortic and mitral valves appeared normal.  - Echo (5/24): EF 50-55% with normal RV, PASP 33 mmHg, s/p bioprosthetic MVR (mean gradient 4 mmHg, no MR), s/p bioprosthetic AVR (mean gradient 9, no AI).  12. Chronic systolic CHF 13. Deafness 14. CKD: stage 3.  15. Suspect COPD 16. 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  17. COPD: Mild obstruction on 9/18 PFTs.  - PFTs (10/20): Mild obstruction, decreased DLCO.  18. Post-op CHB in 12/18 with Medtronic dual chamber PPM.  19. Chronic systolic CHF: Echo (1/19) with EF down to 30-35% post-op.  - TEE (5/19) with EF 25-30%, normal bioprosthetic aortic valve, normal bioprosthetic mitral valve.  - Echo (12/19): EF 50-55%, moderate LVH, bioprosthetic aortic valve with mean gradient 10 mmHg, normally-functioning bioprosthetic mitral valve.  20. Atrial flutter: DCCV in 5/19.  21. OSA: Not using CPAP.  22. Interstitial lung disease: PFTs in 1/22  concerning for restriction.  High resolution chest CT in 2/22 with possible UIP.  - HRCT chest (1/24): Not suggestive of ILD per Dr. Isaiah Serge.  23. Atrial tachycardia: Pace-terminated.   SH: Quit smoking 8/18.  Married, 2 children, retired.    Family History  Problem Relation Age of Onset   Stroke Father        No details   Angina Mother    Kidney cancer Brother    Review of systems complete and found to be negative unless listed in HPI.    Current Outpatient Medications  Medication Sig Dispense Refill   Accu-Chek Softclix Lancets lancets Use as directed to check blood sugar daily and as needed 100 each 3   acetaminophen (TYLENOL) 500 MG tablet Take 1,000 mg by mouth every 6 (six) hours as needed (pain.).     amiodarone (PACERONE) 200 MG tablet Take 1 tablet (200 mg total) by mouth daily. 90 tablet 3   Azelastine HCl 137 MCG/SPRAY SOLN Use 1 spray in each nostril 2 times a day 90 mL 3   Blood Glucose Monitoring Suppl (ACCU-CHEK GUIDE) w/Device KIT check your sugar once daily and as needed as directed 1 kit 0   calcium carbonate (TUMS - DOSED IN MG ELEMENTAL CALCIUM) 500 MG chewable tablet Chew 1 tablet by mouth daily as needed for indigestion or heartburn.     cetirizine (ZYRTEC) 10 MG tablet Take 10 mg by mouth daily as needed for allergies.     citalopram (CELEXA) 20 MG tablet Take 1 tablet (20 mg total) by mouth at bedtime. 30 tablet 5   docusate sodium (COLACE) 100 MG capsule Take 1 capsule (100 mg total) by mouth 3 (three) times daily as needed. 20 capsule 0   empagliflozin (JARDIANCE) 10 MG TABS tablet TAKE 1 TABLET BY MOUTH ONCE A DAY BEFORE BREAKFAST 90 tablet 3   fluticasone (FLONASE) 50 MCG/ACT nasal spray Place into both nostrils daily as needed for allergies or rhinitis.     furosemide (LASIX) 20 MG tablet Take 1 tablet (20 mg total) by mouth daily. 90 tablet 2   glucose blood (ACCU-CHEK GUIDE) test strip Use as directed to check blood sugar once daily and as needed 100 strip 3    loratadine (CLARITIN) 10 MG tablet Take 10 mg by mouth daily as needed for allergies or rhinitis.     methocarbamol (ROBAXIN) 500 MG tablet Take 1 tablet (500 mg total) by mouth every 8 (eight) hours as needed for muscle spasms. 30 tablet 0   pantoprazole (PROTONIX) 40 MG tablet Take 1 tablet (40 mg total) by mouth daily. 90 tablet 3   Pseudoeph-Doxylamine-DM-APAP (NYQUIL PO) Take 30 mLs by mouth daily as needed (Cold Symptons).     rosuvastatin (CRESTOR) 10 MG tablet TAKE 1 TABLET EVERY DAY 90 tablet  3   sacubitril-valsartan (ENTRESTO) 24-26 MG Take 1 tablet by mouth 2 (two) times daily. 180 tablet 3   spironolactone (ALDACTONE) 25 MG tablet TAKE 1 TABLET EVERY DAY 90 tablet 3   traZODone (DESYREL) 50 MG tablet Take 50 mg by mouth at bedtime.     warfarin (COUMADIN) 2.5 MG tablet Take 1 to 1 1/2 tablets daily or as directed by coumadin clinic 100 tablet 1   No current facility-administered medications for this encounter.   BP 110/70   Pulse 60   Wt 113.5 kg (250 lb 3.2 oz)   SpO2 98%   BMI 34.90 kg/m   Wt Readings from Last 3 Encounters:  04/18/23 113.5 kg (250 lb 3.2 oz)  01/18/23 111.1 kg (245 lb)  01/02/23 111.9 kg (246 lb 9.6 oz)   General: NAD Neck: No JVD, no thyromegaly or thyroid nodule.  Lungs: Clear to auscultation bilaterally with normal respiratory effort. CV: Nondisplaced PMI.  Heart regular S1/S2, no S3/S4, no murmur.  No peripheral edema.  No carotid bruit.  Normal pedal pulses.  Abdomen: Soft, nontender, no hepatosplenomegaly, no distention.  Skin: Intact without lesions or rashes.  Neurologic: Alert and oriented x 3.  Psych: Normal affect. Extremities: No clubbing or cyanosis.  HEENT: Normal.   Assessment/Plan: 1. Valvular heart disease: TEE in 9/18 showed severe MR, probably infarct-related with restrictive posterior mitral leaflet. There was moderate aortic stenosis and moderate-severe aortic insufficiency, possible rheumatic.  In 12/18, he had bioprosthetic  MVR and AVR. Valves stable on today's echo. - Antibiotic prophylaxis needed with dental work.  2. Chronic systolic => diastolic CHF: In setting of significant valvular disease as above. EF down to 30-35% post-op (1/19 echo), likely reflects true LV systolic function without the volume load from severe MR and moderate-severe AI.  TEE in 5/19 showed EF 25-30%.  Repeat echo in 6/19 with EF 30-35%. Repeat echo in 12/19 showed EF up to 50-55%, echo in 11/21 again showed EF 50-55% with normal RV systolic function.  TEE in 7/22 showed EF 50-55% with normal RV.  Echo today with EF 50-55%, normal RV.  He is not volume overloaded on exam.  NYHA class II symptoms, not volume overloaded on exam.   - Continue Entresto 24/26 bid, BMET/BNP today.  - Continue Lasix 20 mg daily.     - With wide PR interval, I am going to have him stop Coreg to avoid any increase in RV pacing.  - Continue spironolactone 25 mg daily. - Continue Jardiance 10 mg daily.  3. CKD: Stage 3.  - As above, continue Jardiance.  - BMET today.  4. Atrial fibrillation/flutter: Paroxysmal. He has had a Maze procedure.  He tends to tolerate atrial arrhythmias poorly.  He had atrial flutter with DCCV in 5/19.  Recurrent atrial fibrillation in 2020.  Stopped amiodarone and went into atrial fibrillation in 6/22, very symptomatic.  Amiodarone restarted and he was cardioverted to NSR in 7/22.   - He is on warfarin. INR followed by coumadin clinic.  CBC today.  - Continue amiodarone 200 mg daily for now.  Check LFTs and TSH.  He will need regular eye exams.  CT chest in past showed possible early ILD, ESR was only mildly elevated.  He saw pulmonary, not definite amiodarone-induced lung toxicity but need to follow closely.  Would prefer him off amiodarone, but he developed atrial tachycardia with RVR when we decreased the dose.  He had a repeat HRCT chest in 1/24 that per Dr Isaiah Serge  did not show clear ILD.  5. COPD: Mild obstruction on 10/20 PFTs, decreased  DLCO.  He has quit smoking.   - Follows with pulmonary.  6. CVA: Remote, minimal residual. S/p carotid stenting.  - No ASA given warfarin use.  7. Hyperlipidemia: Continue Crestor.  LDL good in 12/23.  8. Anemia: Fe deficiency. CBC today.  9. 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  The artery to the PDA was occluded with left to right collaterals.  He had SVG-PDA in 12/18. No chest pain.  - Continue statin.      - No ASA given warfarin use.  10. CHB: has Medtronic PPM.  He a-paces a high percentage of the time but there is minimal RV pacing.  - Stop Coreg as above with very long PR.  11. OSA: Severe OSA, he does not want to use CPAP.  12. Obesity: Body mass index is 34.9 kg/m. - He decided against Ozempic for weight loss.  13. HTN: BP controlled.  14. Interstitial lung disease: Restrictive PFTs in 1/22.  High resolution CT chest concerning for ILD, possibly usual interstitial pneumonitis.  He saw pulmonary, could not rule out amiodarone lung toxicity so tried him off amiodarone.  He quickly went back into atrial fibrillation that was tolerated poorly, so amiodarone restarted.  We again tried to wean amiodarone but he developed atrial tachycardia with RVR.  Now back on amiodarone 200 mg daily. Repeat HRCT in 1/24 not clearly ILD per pulmonary.  - Continue to followup with pulmonary.   Followup in 4 months with APP.  Marca Ancona  04/19/2023

## 2023-04-28 ENCOUNTER — Other Ambulatory Visit (HOSPITAL_COMMUNITY): Payer: Self-pay | Admitting: Cardiology

## 2023-04-28 ENCOUNTER — Other Ambulatory Visit: Payer: Self-pay | Admitting: Cardiology

## 2023-04-28 DIAGNOSIS — Z953 Presence of xenogenic heart valve: Secondary | ICD-10-CM

## 2023-04-28 NOTE — Telephone Encounter (Signed)
Prescription refill request received for warfarin Lov: 04/18/23 Shirlee Latch)  Next INR check: 05/31/23 Warfarin tablet strength: 2.5mg   Appropriate dose. Refill sent.

## 2023-05-03 DIAGNOSIS — R339 Retention of urine, unspecified: Secondary | ICD-10-CM | POA: Diagnosis not present

## 2023-05-03 DIAGNOSIS — I129 Hypertensive chronic kidney disease with stage 1 through stage 4 chronic kidney disease, or unspecified chronic kidney disease: Secondary | ICD-10-CM | POA: Diagnosis not present

## 2023-05-03 DIAGNOSIS — I480 Paroxysmal atrial fibrillation: Secondary | ICD-10-CM | POA: Diagnosis not present

## 2023-05-03 DIAGNOSIS — N183 Chronic kidney disease, stage 3 unspecified: Secondary | ICD-10-CM | POA: Diagnosis not present

## 2023-05-04 LAB — LAB REPORT - SCANNED
Creatinine, POC: 41.5 mg/dL
EGFR: 31
PSA, Total: 0.4

## 2023-05-25 ENCOUNTER — Other Ambulatory Visit (HOSPITAL_COMMUNITY): Payer: Self-pay | Admitting: Cardiology

## 2023-05-30 ENCOUNTER — Encounter: Payer: Self-pay | Admitting: Podiatry

## 2023-05-30 ENCOUNTER — Ambulatory Visit (INDEPENDENT_AMBULATORY_CARE_PROVIDER_SITE_OTHER): Payer: Medicare HMO | Admitting: Podiatry

## 2023-05-30 DIAGNOSIS — M79674 Pain in right toe(s): Secondary | ICD-10-CM | POA: Diagnosis not present

## 2023-05-30 DIAGNOSIS — B351 Tinea unguium: Secondary | ICD-10-CM

## 2023-05-30 DIAGNOSIS — Z7901 Long term (current) use of anticoagulants: Secondary | ICD-10-CM | POA: Diagnosis not present

## 2023-05-30 DIAGNOSIS — M79675 Pain in left toe(s): Secondary | ICD-10-CM | POA: Diagnosis not present

## 2023-05-30 NOTE — Progress Notes (Signed)
This patient returns to my office for at risk foot care.  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  This patient is unable to cut nails himself since the patient cannot reach his nails.These nails are painful walking and wearing shoes.  This patient presents for at risk foot care today.  General Appearance  Alert, conversant and in no acute stress.  Vascular  Dorsalis pedis and posterior tibial  pulses are palpable  bilaterally.  Capillary return is within normal limits  bilaterally. Temperature is within normal limits  bilaterally.  Neurologic  Senn-Weinstein monofilament wire test within normal limits  bilaterally. Muscle power within normal limits bilaterally.  Nails Thick disfigured discolored nails with subungual debris  from hallux to fifth toes bilaterally. No evidence of bacterial infection or drainage bilaterally.  Orthopedic  No limitations of motion  feet .  No crepitus or effusions noted.  No bony pathology or digital deformities noted.  Skin  normotropic skin with no porokeratosis noted bilaterally.  No signs of infections or ulcers noted.   Asymptomatic heel callus.  Onychomycosis  Pain in right toes  Pain in left toes  Consent was obtained for treatment procedures.   Mechanical debridement of nails 1-5  bilaterally performed with a nail nipper.  Filed with dremel without incident.    Return office visit    3 months                  Told patient to return for periodic foot care and evaluation due to potential at risk complications.   Helane Gunther DPM

## 2023-05-31 ENCOUNTER — Ambulatory Visit: Payer: Medicare HMO | Attending: Internal Medicine | Admitting: *Deleted

## 2023-05-31 DIAGNOSIS — I480 Paroxysmal atrial fibrillation: Secondary | ICD-10-CM | POA: Diagnosis not present

## 2023-05-31 DIAGNOSIS — Z8679 Personal history of other diseases of the circulatory system: Secondary | ICD-10-CM | POA: Diagnosis not present

## 2023-05-31 DIAGNOSIS — Z9889 Other specified postprocedural states: Secondary | ICD-10-CM | POA: Diagnosis not present

## 2023-05-31 DIAGNOSIS — Z951 Presence of aortocoronary bypass graft: Secondary | ICD-10-CM

## 2023-05-31 DIAGNOSIS — Z5181 Encounter for therapeutic drug level monitoring: Secondary | ICD-10-CM | POA: Diagnosis not present

## 2023-05-31 DIAGNOSIS — Z953 Presence of xenogenic heart valve: Secondary | ICD-10-CM

## 2023-05-31 LAB — POCT INR: INR: 2.2 (ref 2.0–3.0)

## 2023-05-31 NOTE — Patient Instructions (Signed)
Description   Continue taking warfarin 1 tablet daily except 1.5 tablets on Sundays.  Stay consistent with greens (2-3 times per week).   Recheck INR in 6 weeks.  Coumadin Clinic [phone]. HAVE DENTAL OFFICE FAX CLEARANCE FORM TO CARDIOLOGY OFFICE.     

## 2023-06-01 ENCOUNTER — Ambulatory Visit (INDEPENDENT_AMBULATORY_CARE_PROVIDER_SITE_OTHER): Payer: Medicare HMO

## 2023-06-01 DIAGNOSIS — I442 Atrioventricular block, complete: Secondary | ICD-10-CM

## 2023-06-01 LAB — CUP PACEART REMOTE DEVICE CHECK
Battery Remaining Longevity: 97 mo
Battery Voltage: 2.99 V
Brady Statistic AP VP Percent: 0.16 %
Brady Statistic AP VS Percent: 89.54 %
Brady Statistic AS VP Percent: 0.03 %
Brady Statistic AS VS Percent: 10.27 %
Brady Statistic RA Percent Paced: 89.72 %
Brady Statistic RV Percent Paced: 0.19 %
Date Time Interrogation Session: 20240619212120
Implantable Lead Connection Status: 753985
Implantable Lead Connection Status: 753985
Implantable Lead Implant Date: 20181211
Implantable Lead Implant Date: 20181211
Implantable Lead Location: 753859
Implantable Lead Location: 753860
Implantable Lead Model: 3830
Implantable Lead Model: 5076
Implantable Pulse Generator Implant Date: 20181211
Lead Channel Impedance Value: 285 Ohm
Lead Channel Impedance Value: 323 Ohm
Lead Channel Impedance Value: 399 Ohm
Lead Channel Impedance Value: 399 Ohm
Lead Channel Pacing Threshold Amplitude: 0.625 V
Lead Channel Pacing Threshold Amplitude: 1.5 V
Lead Channel Pacing Threshold Pulse Width: 0.4 ms
Lead Channel Pacing Threshold Pulse Width: 0.4 ms
Lead Channel Sensing Intrinsic Amplitude: 3.125 mV
Lead Channel Sensing Intrinsic Amplitude: 3.125 mV
Lead Channel Sensing Intrinsic Amplitude: 5.625 mV
Lead Channel Sensing Intrinsic Amplitude: 5.625 mV
Lead Channel Setting Pacing Amplitude: 2 V
Lead Channel Setting Pacing Amplitude: 2.5 V
Lead Channel Setting Pacing Pulse Width: 0.7 ms
Lead Channel Setting Sensing Sensitivity: 1.2 mV
Zone Setting Status: 755011
Zone Setting Status: 755011

## 2023-06-21 NOTE — Progress Notes (Signed)
Remote pacemaker transmission.   

## 2023-07-01 ENCOUNTER — Other Ambulatory Visit (HOSPITAL_COMMUNITY): Payer: Self-pay | Admitting: Cardiology

## 2023-07-03 ENCOUNTER — Other Ambulatory Visit (HOSPITAL_COMMUNITY): Payer: Self-pay

## 2023-07-03 MED ORDER — EMPAGLIFLOZIN 10 MG PO TABS
ORAL_TABLET | ORAL | 3 refills | Status: DC
  Filled 2023-07-03: qty 90, 90d supply, fill #0
  Filled 2023-09-17: qty 90, 90d supply, fill #1
  Filled 2023-12-24: qty 90, 90d supply, fill #2
  Filled 2024-03-16: qty 90, 90d supply, fill #3

## 2023-07-12 ENCOUNTER — Ambulatory Visit: Payer: Medicare HMO | Attending: Cardiovascular Disease | Admitting: *Deleted

## 2023-07-12 DIAGNOSIS — I480 Paroxysmal atrial fibrillation: Secondary | ICD-10-CM | POA: Diagnosis not present

## 2023-07-12 DIAGNOSIS — Z8679 Personal history of other diseases of the circulatory system: Secondary | ICD-10-CM | POA: Diagnosis not present

## 2023-07-12 DIAGNOSIS — Z953 Presence of xenogenic heart valve: Secondary | ICD-10-CM | POA: Diagnosis not present

## 2023-07-12 DIAGNOSIS — Z5181 Encounter for therapeutic drug level monitoring: Secondary | ICD-10-CM

## 2023-07-12 DIAGNOSIS — Z951 Presence of aortocoronary bypass graft: Secondary | ICD-10-CM | POA: Diagnosis not present

## 2023-07-12 DIAGNOSIS — Z9889 Other specified postprocedural states: Secondary | ICD-10-CM | POA: Diagnosis not present

## 2023-07-12 LAB — POCT INR: INR: 1.7 — AB (ref 2.0–3.0)

## 2023-07-12 NOTE — Patient Instructions (Signed)
Description   Today take 1.5 tablets of warfarin then continue taking warfarin 1 tablet daily except 1.5 tablets on Sundays.  Stay consistent with greens (2-3 times per week).   Recheck INR in 6 weeks.  Coumadin Clinic [phone]. HAVE DENTAL OFFICE FAX CLEARANCE FORM TO CARDIOLOGY OFFICE.

## 2023-07-18 ENCOUNTER — Encounter: Payer: Self-pay | Admitting: Student

## 2023-07-18 ENCOUNTER — Ambulatory Visit
Admission: RE | Admit: 2023-07-18 | Discharge: 2023-07-18 | Disposition: A | Discharge status: Home / Self Care | Payer: Medicare HMO | Source: Ambulatory Visit | Attending: Student | Admitting: Student

## 2023-07-18 ENCOUNTER — Ambulatory Visit: Payer: Medicare HMO | Admitting: Student

## 2023-07-18 VITALS — BP 108/62 | HR 60 | Ht 71.0 in | Wt 251.8 lb

## 2023-07-18 DIAGNOSIS — Z9889 Other specified postprocedural states: Secondary | ICD-10-CM | POA: Diagnosis not present

## 2023-07-18 DIAGNOSIS — Z8679 Personal history of other diseases of the circulatory system: Secondary | ICD-10-CM | POA: Diagnosis not present

## 2023-07-18 DIAGNOSIS — Z950 Presence of cardiac pacemaker: Secondary | ICD-10-CM

## 2023-07-18 DIAGNOSIS — Z951 Presence of aortocoronary bypass graft: Secondary | ICD-10-CM | POA: Diagnosis not present

## 2023-07-18 DIAGNOSIS — I442 Atrioventricular block, complete: Secondary | ICD-10-CM | POA: Diagnosis not present

## 2023-07-18 DIAGNOSIS — I480 Paroxysmal atrial fibrillation: Secondary | ICD-10-CM

## 2023-07-18 DIAGNOSIS — Z953 Presence of xenogenic heart valve: Secondary | ICD-10-CM | POA: Diagnosis not present

## 2023-07-18 DIAGNOSIS — I5022 Chronic systolic (congestive) heart failure: Secondary | ICD-10-CM

## 2023-07-18 LAB — CUP PACEART INCLINIC DEVICE CHECK
Battery Remaining Longevity: 99 mo
Battery Voltage: 2.99 V
Brady Statistic AP VP Percent: 0.12 %
Brady Statistic AP VS Percent: 84.9 %
Brady Statistic AS VP Percent: 0.04 %
Brady Statistic AS VS Percent: 14.94 %
Brady Statistic RA Percent Paced: 85.05 %
Brady Statistic RV Percent Paced: 0.16 %
Date Time Interrogation Session: 20240806115652
Implantable Lead Connection Status: 753985
Implantable Lead Connection Status: 753985
Implantable Lead Implant Date: 20181211
Implantable Lead Implant Date: 20181211
Implantable Lead Location: 753859
Implantable Lead Location: 753860
Implantable Lead Model: 3830
Implantable Lead Model: 5076
Implantable Pulse Generator Implant Date: 20181211
Lead Channel Impedance Value: 304 Ohm
Lead Channel Impedance Value: 342 Ohm
Lead Channel Impedance Value: 418 Ohm
Lead Channel Impedance Value: 418 Ohm
Lead Channel Pacing Threshold Amplitude: 0.625 V
Lead Channel Pacing Threshold Amplitude: 1.375 V
Lead Channel Pacing Threshold Pulse Width: 0.4 ms
Lead Channel Pacing Threshold Pulse Width: 0.4 ms
Lead Channel Sensing Intrinsic Amplitude: 3.375 mV
Lead Channel Sensing Intrinsic Amplitude: 3.5 mV
Lead Channel Sensing Intrinsic Amplitude: 6.125 mV
Lead Channel Sensing Intrinsic Amplitude: 6.125 mV
Lead Channel Setting Pacing Amplitude: 2 V
Lead Channel Setting Pacing Amplitude: 2.5 V
Lead Channel Setting Pacing Pulse Width: 0.7 ms
Lead Channel Setting Sensing Sensitivity: 1.2 mV
Zone Setting Status: 755011
Zone Setting Status: 755011

## 2023-07-18 NOTE — Patient Instructions (Addendum)
Medication Instructions:  Your physician recommends that you continue on your current medications as directed. Please refer to the Current Medication list given to you today.  *If you need a refill on your cardiac medications before your next appointment, please call your pharmacy*  Lab Work: CMET, TSH, FreeT4-TODAY If you have labs (blood work) drawn today and your tests are completely normal, you will receive your results only by: MyChart Message (if you have MyChart) OR A paper copy in the mail If you have any lab test that is abnormal or we need to change your treatment, we will call you to review the results.   Testing/Procedures: Chest X-ray Instructions:    1. You may have this done at the [facility], located in the [facility] Building on the 1st floor.    2. You do no have to have an appointment.    3. [address] [place], [place] [address]        [phone]        Monday - Friday  8:00 am - 5:00 pm   Follow-Up: At [facility], you and your health needs are our priority.  As part of our continuing mission to provide you with exceptional heart care, we have created designated Provider Care Teams.  These Care Teams include your primary Cardiologist (physician) and Advanced Practice Providers (APPs -  Physician Assistants and Nurse Practitioners) who all work together to provide you with the care you need, when you need it.  Your next appointment:   6 month(s)  Provider:   Loman Brooklyn, MD

## 2023-07-18 NOTE — Progress Notes (Signed)
  Electrophysiology Office Note:   ID:  [name], [name] 1946-05-04, MRN [id number]  Primary Cardiologist: None Electrophysiologist: Will Jorja Loa, MD      History of Present Illness:   Brandon [name] is a 77 y.o. male with h/o HFrecEF, CVA, CAD, CHB s/p MDT PPM, AF, AFL, and Atrial tachycardia  seen today for routine electrophysiology followup.  Since last being seen in our clinic the patient reports doing well from an EP perspective. No palpitations.  He has complained of some mobility of his generator. At times it is "standing up like a shark fin". He denies twiddling with it. he denies chest pain, palpitations, dyspnea, PND, orthopnea, nausea, vomiting, dizziness, syncope, edema, weight gain, or early satiety.   Review of systems complete and found to be negative unless listed in HPI.   EP Information / Studies Reviewed:    EKG is not ordered today. EKG from 04/18/2023 reviewed which showed A paced at 60 bpm with prolonged PR interval       PPM Interrogation-  reviewed in detail today,  See PACEART report.  Device History: Medtronic Dual Chamber PPM implanted 2018 for CHB    Physical Exam:   VS:  BP 108/62   Pulse 60   Ht 5\' 11"  (1.803 m)   Wt 251 lb 12.8 oz (114.2 kg)   SpO2 98%   BMI 35.12 kg/m    Wt Readings from Last 3 Encounters:  07/18/23 251 lb 12.8 oz (114.2 kg)  04/18/23 250 lb 3.2 oz (113.5 kg)  01/18/23 245 lb (111.1 kg)     GEN: Well nourished, well developed in no acute distress NECK: No JVD; No carotid bruits CARDIAC: Regular rate and rhythm, no murmurs, rubs, gallops RESPIRATORY:  Clear to auscultation without rales, wheezing or rhonchi  ABDOMEN: Soft, non-tender, non-distended EXTREMITIES:  No edema; No deformity   ASSESSMENT AND PLAN:    CHB s/p Medtronic PPM  Normal PPM function See Pace Art report No changes today Will get CXR to make sure leads have not twisted. Leads and parameters stable.   Persistent atrial  fibrillation Atypical atrial flutter S/p repeat ablation 12/16/2021 S/p surgical MAZE and LAA clipping Continue amidoarone 200 mg daily.  H/o AT at CL of ~520ms.  Historically have been able to pace out at 280-355ms  VHD Stable Mital and AVR on prior tEE  HFrEF LVEF 50-55% on Echo 04/18/2023   Disposition:   Follow up with Dr. Elberta Fortis in 6 months  Signed, Graciella Freer, PA-C

## 2023-08-11 ENCOUNTER — Ambulatory Visit (INDEPENDENT_AMBULATORY_CARE_PROVIDER_SITE_OTHER): Payer: Medicare HMO | Admitting: Pulmonary Disease

## 2023-08-11 DIAGNOSIS — J849 Interstitial pulmonary disease, unspecified: Secondary | ICD-10-CM | POA: Diagnosis not present

## 2023-08-11 LAB — PULMONARY FUNCTION TEST
DL/VA % pred: 94 %
DL/VA: 3.68 ml/min/mmHg/L
DLCO cor % pred: 84 %
DLCO cor: 21.52 ml/min/mmHg
DLCO unc % pred: 84 %
DLCO unc: 21.52 ml/min/mmHg
FEF 25-75 Post: 1.82 L/s
FEF 25-75 Pre: 2.05 L/s
FEF2575-%Change-Post: -11 %
FEF2575-%Pred-Post: 81 %
FEF2575-%Pred-Pre: 92 %
FEV1-%Change-Post: -10 %
FEV1-%Pred-Post: 70 %
FEV1-%Pred-Pre: 78 %
FEV1-Post: 2.19 L
FEV1-Pre: 2.43 L
FEV1FVC-%Change-Post: -5 %
FEV1FVC-%Pred-Pre: 102 %
FEV6-%Change-Post: -4 %
FEV6-%Pred-Post: 77 %
FEV6-%Pred-Pre: 81 %
FEV6-Post: 3.13 L
FEV6-Pre: 3.29 L
FEV6FVC-%Change-Post: 0 %
FEV6FVC-%Pred-Post: 106 %
FEV6FVC-%Pred-Pre: 106 %
FVC-%Change-Post: -4 %
FVC-%Pred-Post: 72 %
FVC-%Pred-Pre: 76 %
FVC-Post: 3.13 L
FVC-Pre: 3.29 L
Post FEV1/FVC ratio: 70 %
Post FEV6/FVC ratio: 100 %
Pre FEV1/FVC ratio: 74 %
Pre FEV6/FVC Ratio: 100 %
RV % pred: 75 %
RV: 1.99 L
TLC % pred: 77 %
TLC: 5.59 L

## 2023-08-11 NOTE — Patient Instructions (Signed)
Full PFT performed today. °

## 2023-08-11 NOTE — Progress Notes (Signed)
Full PFT performed today. °

## 2023-08-15 ENCOUNTER — Ambulatory Visit: Payer: Medicare HMO | Admitting: Pulmonary Disease

## 2023-08-15 ENCOUNTER — Encounter: Payer: Self-pay | Admitting: Pulmonary Disease

## 2023-08-15 VITALS — BP 128/70 | HR 63 | Temp 98.2°F | Ht 71.0 in | Wt 250.8 lb

## 2023-08-15 DIAGNOSIS — J849 Interstitial pulmonary disease, unspecified: Secondary | ICD-10-CM

## 2023-08-15 DIAGNOSIS — R0600 Dyspnea, unspecified: Secondary | ICD-10-CM | POA: Diagnosis not present

## 2023-08-15 NOTE — Progress Notes (Signed)
BAELIN PETROSSIAN    [id number]    11-06-1946  Primary Care Physician:Pa, [facility]  Referring Physician: [facility] [address] [place],  [place] [address]  Chief complaint:   Follow-up for abnormal CT, concern for ILD  HPI: 77 y.o. ex-smoker with significant cardiac history of  CVA, paroxysmal atrial fibrillation, valvular heart disease, and chronic diastolic CHF.  He had a prolonged hospitalization in 2018 for heart failure, A. fib with RVR.  Eventually underwent aortic and mitral valve replacement, Maze procedure, LAA clipping  He was started on amiodarone around 2018 2019 and continues on at 800 mg a day.  Follows with Dr. Shirlee Latch in the heart failure clinic  Over the past few months he has noted increasing dyspnea on exertion.  He had a work-up done including PFTs and CT scan which showed changes suggestive of interstitial lung disease and has been referred here for further evaluation.  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  He did get a sed rate which shows slightly elevated at 46  Amiodarone was temporarily held however restarted in June 2022 as he is back in atrial fibrillation He continues to have issues with recurrent atrial fibrillation and underwent ablation on 12/16/2021.  He was attempted to be taken off amiodarone in 2023 but quickly went back into atrial fibrillation and hence the medication was restarted.  He had an  ILD panel which showed marked elevation of CCP.  Evaluated by rheumatology in early May 2022 with no findings of rheumatoid arthritis.  Elevated CCP was felt to be incidental  He was tried on Trelegy inhaler but was stopped after it made his breathing worse with increasing coughing  Pets: 2 cats Occupation: Retired Curator at [employer]. Exposures: Exposure to chemicals while working.  No ongoing exposures.  No mold, hot tub, Jacuzzi.  No feather pillows or comforter Smoking  history: 30-pack-year smoker.  Quit in 2018 Travel history: No significant travel history Relevant family history: No family history of lung disease  Interim history: States that breathing is doing well.   Here for review of his PFTs.  Outpatient Encounter Medications as of 08/15/2023  Medication Sig   Accu-Chek Softclix Lancets lancets Use as directed to check blood sugar daily and as needed   acetaminophen (TYLENOL) 500 MG tablet Take 1,000 mg by mouth every 6 (six) hours as needed (pain.).   amiodarone (PACERONE) 200 MG tablet TAKE 1 TABLET (200 MG TOTAL) BY MOUTH DAILY.   Azelastine HCl 137 MCG/SPRAY SOLN Use 1 spray in each nostril 2 times a day   Blood Glucose Monitoring Suppl (ACCU-CHEK GUIDE) w/Device KIT check your sugar once daily and as needed as directed   calcium carbonate (TUMS - DOSED IN MG ELEMENTAL CALCIUM) 500 MG chewable tablet Chew 1 tablet by mouth daily as needed for indigestion or heartburn.   cetirizine (ZYRTEC) 10 MG tablet Take 10 mg by mouth daily as needed for allergies.   citalopram (CELEXA) 20 MG tablet Take 1 tablet (20 mg total) by mouth at bedtime.   docusate sodium (COLACE) 100 MG capsule Take 1 capsule (100 mg total) by mouth 3 (three) times daily as needed.   empagliflozin (JARDIANCE) 10 MG TABS tablet TAKE 1 TABLET BY MOUTH ONCE A DAY BEFORE BREAKFAST   fluticasone (FLONASE) 50 MCG/ACT nasal spray Place into both nostrils daily as needed for allergies or rhinitis.   furosemide (LASIX) 20 MG tablet  TAKE 1 TABLET EVERY DAY   glucose blood (ACCU-CHEK GUIDE) test strip Use as directed to check blood sugar once daily and as needed   loratadine (CLARITIN) 10 MG tablet Take 10 mg by mouth daily as needed for allergies or rhinitis.   methocarbamol (ROBAXIN) 500 MG tablet Take 1 tablet (500 mg total) by mouth every 8 (eight) hours as needed for muscle spasms.   pantoprazole (PROTONIX) 40 MG tablet Take 1 tablet (40 mg total) by mouth daily.    Pseudoeph-Doxylamine-DM-APAP (NYQUIL PO) Take 30 mLs by mouth daily as needed (Cold Symptons).   rosuvastatin (CRESTOR) 10 MG tablet TAKE 1 TABLET EVERY DAY   sacubitril-valsartan (ENTRESTO) 24-26 MG Take 1 tablet by mouth 2 (two) times daily.   spironolactone (ALDACTONE) 25 MG tablet TAKE 1 TABLET EVERY DAY   traZODone (DESYREL) 50 MG tablet Take 50 mg by mouth at bedtime.   warfarin (COUMADIN) 2.5 MG tablet TAKE 1 TO 1 AND 1/2 TABLETS DAILY OR AS DIRECTED BY THE COUMADIN CLINIC   No facility-administered encounter medications on file as of 08/15/2023.   Physical Exam: Blood pressure 128/70, pulse 63, temperature 98.2 F (36.8 C), temperature source Oral, height 5\' 11"  (1.803 m), weight 250 lb 12.8 oz (113.8 kg), SpO2 96%. Gen:      No acute distress HEENT:  EOMI, sclera anicteric Neck:     No masses; no thyromegaly Lungs:    Clear to auscultation bilaterally; normal respiratory effort CV:         Regular rate and rhythm; no murmurs Abd:      + bowel sounds; soft, non-tender; no palpable masses, no distension Ext:    No edema; adequate peripheral perfusion Skin:      Warm and dry; no rash Neuro: alert and oriented x 3 Psych: normal mood and affect   Data Reviewed: Imaging: CT coronary 09/28/17-no interstitial lung disease, mild bibasal atelectasis  High-res CT 01/20/2021-bilateral groundglass attenuation most evident in the mid to lower lungs with mild reticulation, moderate air trapping.   High-res CT 05/08/2021-minimal nonspecific peripheral interstitial opacity, air trapping, bronchial wall thickening.  High-res CT 12/27/2022-moderate patchy air trapping, minimal nonspecific interstitial opacities and reticulation. I have reviewed the images personally  PFTs: 01/07/2021 FVC 2.70 (60%), FEV1 2.19 (16%), F/F 81, TLC 6.03 [82%], DLCO 13.20 [50%] Moderate diffusion defect  06/10/2021 FVC 2.95 [6%], FEV1 2.33 [73%], F/F 79, TLC 6.92 [95%], DLCO 17.20 (66%] Mild restriction, diffusion  defect.  Improved compared to January 2022  08/15/2023 FVC 4.32 [102%], FEV1 3.54 [119%], F/F82, TLC 7.03 [94%], DLCO 28.35 [111%] Mild restriction  08/11/2023 FVC 3.13 [92%], FEV1 2.19 [70%], F/F70, TLC 5.59 [77%], DLCO 21.52 [4%] Mild restriction  Labs: Sed rate 01/29/21-46  CTD serologies 02/24/2021-positive for CCP greater than 250  Assessment:  Evaluation for interstitial lung disease CT scan which shows groundglass opacities with minimal reticulation.  The appearance of this is nonspecific and there is no clear evidence of interstitial lung disease or pulmonary fibrosis.  The groundglass changes may be from heart failure  Back on amiodarone due to recurrent atrial fibrillation and looks like he needs to be on this medication to keep his rhythm under control.  His follow-up CT scan is reassuring as it is stable.  PFT showed improvement in diffusion capacity Off inhalers its not helping with the breathing  Continue monitoring Get high-res CT in 6 months and return to clinic  Plan/Recommendations: -High resolution CT in 6 months  Chilton Greathouse MD [facility] 08/15/2023, 10:46 AM  CC: Pa, [facility]

## 2023-08-15 NOTE — Patient Instructions (Signed)
I am glad to evaluate your breathing Your lung function test shows some improvement which is good news Will get a high-resolution CT in 6 months and return to clinic after CT scan

## 2023-08-16 ENCOUNTER — Ambulatory Visit: Payer: Medicare HMO | Attending: Cardiovascular Disease | Admitting: *Deleted

## 2023-08-16 DIAGNOSIS — Z9889 Other specified postprocedural states: Secondary | ICD-10-CM

## 2023-08-16 DIAGNOSIS — Z8679 Personal history of other diseases of the circulatory system: Secondary | ICD-10-CM | POA: Diagnosis not present

## 2023-08-16 DIAGNOSIS — I480 Paroxysmal atrial fibrillation: Secondary | ICD-10-CM

## 2023-08-16 DIAGNOSIS — Z953 Presence of xenogenic heart valve: Secondary | ICD-10-CM | POA: Diagnosis not present

## 2023-08-16 DIAGNOSIS — Z5181 Encounter for therapeutic drug level monitoring: Secondary | ICD-10-CM

## 2023-08-16 DIAGNOSIS — Z951 Presence of aortocoronary bypass graft: Secondary | ICD-10-CM

## 2023-08-16 LAB — POCT INR: INR: 1.7 — AB (ref 2.0–3.0)

## 2023-08-16 NOTE — Patient Instructions (Signed)
Description   Today take 1.5 tablets of warfarin then START taking warfarin 1 tablet daily except 1.5 tablets on Sundays and Wednesdays.  Stay consistent with greens (2-3 times per week).   Recheck INR in 4 weeks.  Coumadin Clinic [phone]. HAVE DENTAL OFFICE FAX CLEARANCE FORM TO CARDIOLOGY OFFICE.

## 2023-08-18 ENCOUNTER — Encounter (HOSPITAL_COMMUNITY): Payer: Medicare HMO

## 2023-08-21 DIAGNOSIS — E782 Mixed hyperlipidemia: Secondary | ICD-10-CM | POA: Diagnosis not present

## 2023-08-21 DIAGNOSIS — E1169 Type 2 diabetes mellitus with other specified complication: Secondary | ICD-10-CM | POA: Diagnosis not present

## 2023-08-21 DIAGNOSIS — E1151 Type 2 diabetes mellitus with diabetic peripheral angiopathy without gangrene: Secondary | ICD-10-CM | POA: Diagnosis not present

## 2023-08-21 DIAGNOSIS — Z23 Encounter for immunization: Secondary | ICD-10-CM | POA: Diagnosis not present

## 2023-08-21 DIAGNOSIS — E1122 Type 2 diabetes mellitus with diabetic chronic kidney disease: Secondary | ICD-10-CM | POA: Diagnosis not present

## 2023-08-21 DIAGNOSIS — I480 Paroxysmal atrial fibrillation: Secondary | ICD-10-CM | POA: Diagnosis not present

## 2023-08-21 DIAGNOSIS — N1832 Chronic kidney disease, stage 3b: Secondary | ICD-10-CM | POA: Diagnosis not present

## 2023-08-21 DIAGNOSIS — Z0000 Encounter for general adult medical examination without abnormal findings: Secondary | ICD-10-CM | POA: Diagnosis not present

## 2023-08-21 DIAGNOSIS — I130 Hypertensive heart and chronic kidney disease with heart failure and stage 1 through stage 4 chronic kidney disease, or unspecified chronic kidney disease: Secondary | ICD-10-CM | POA: Diagnosis not present

## 2023-08-21 DIAGNOSIS — I5032 Chronic diastolic (congestive) heart failure: Secondary | ICD-10-CM | POA: Diagnosis not present

## 2023-08-21 DIAGNOSIS — I10 Essential (primary) hypertension: Secondary | ICD-10-CM | POA: Diagnosis not present

## 2023-08-31 ENCOUNTER — Ambulatory Visit: Payer: Medicare HMO | Admitting: Podiatry

## 2023-08-31 ENCOUNTER — Ambulatory Visit (INDEPENDENT_AMBULATORY_CARE_PROVIDER_SITE_OTHER): Payer: Medicare HMO

## 2023-08-31 DIAGNOSIS — I442 Atrioventricular block, complete: Secondary | ICD-10-CM | POA: Diagnosis not present

## 2023-08-31 LAB — CUP PACEART REMOTE DEVICE CHECK
Battery Remaining Longevity: 97 mo
Battery Voltage: 2.99 V
Brady Statistic AP VP Percent: 0.08 %
Brady Statistic AP VS Percent: 77.14 %
Brady Statistic AS VP Percent: 0.04 %
Brady Statistic AS VS Percent: 22.75 %
Brady Statistic RA Percent Paced: 77.27 %
Brady Statistic RV Percent Paced: 0.12 %
Date Time Interrogation Session: 20240919010120
Implantable Lead Connection Status: 753985
Implantable Lead Connection Status: 753985
Implantable Lead Implant Date: 20181211
Implantable Lead Implant Date: 20181211
Implantable Lead Location: 753859
Implantable Lead Location: 753860
Implantable Lead Model: 3830
Implantable Lead Model: 5076
Implantable Pulse Generator Implant Date: 20181211
Lead Channel Impedance Value: 304 Ohm
Lead Channel Impedance Value: 342 Ohm
Lead Channel Impedance Value: 380 Ohm
Lead Channel Impedance Value: 399 Ohm
Lead Channel Pacing Threshold Amplitude: 0.625 V
Lead Channel Pacing Threshold Amplitude: 1.5 V
Lead Channel Pacing Threshold Pulse Width: 0.4 ms
Lead Channel Pacing Threshold Pulse Width: 0.4 ms
Lead Channel Sensing Intrinsic Amplitude: 4.25 mV
Lead Channel Sensing Intrinsic Amplitude: 4.25 mV
Lead Channel Sensing Intrinsic Amplitude: 6.5 mV
Lead Channel Sensing Intrinsic Amplitude: 6.5 mV
Lead Channel Setting Pacing Amplitude: 2 V
Lead Channel Setting Pacing Amplitude: 2.5 V
Lead Channel Setting Pacing Pulse Width: 0.7 ms
Lead Channel Setting Sensing Sensitivity: 1.2 mV
Zone Setting Status: 755011
Zone Setting Status: 755011

## 2023-09-06 ENCOUNTER — Encounter (HOSPITAL_COMMUNITY): Payer: Self-pay

## 2023-09-06 ENCOUNTER — Ambulatory Visit (HOSPITAL_COMMUNITY)
Admission: RE | Admit: 2023-09-06 | Discharge: 2023-09-06 | Disposition: A | Discharge status: Home / Self Care | Payer: Medicare HMO | Source: Ambulatory Visit | Attending: Family Medicine | Admitting: Family Medicine

## 2023-09-06 VITALS — BP 120/62 | HR 60 | Wt 252.8 lb

## 2023-09-06 DIAGNOSIS — I4719 Other supraventricular tachycardia: Secondary | ICD-10-CM | POA: Diagnosis not present

## 2023-09-06 DIAGNOSIS — I442 Atrioventricular block, complete: Secondary | ICD-10-CM

## 2023-09-06 DIAGNOSIS — Z6835 Body mass index (BMI) 35.0-35.9, adult: Secondary | ICD-10-CM | POA: Diagnosis not present

## 2023-09-06 DIAGNOSIS — I4892 Unspecified atrial flutter: Secondary | ICD-10-CM | POA: Diagnosis not present

## 2023-09-06 DIAGNOSIS — J849 Interstitial pulmonary disease, unspecified: Secondary | ICD-10-CM | POA: Insufficient documentation

## 2023-09-06 DIAGNOSIS — E785 Hyperlipidemia, unspecified: Secondary | ICD-10-CM | POA: Insufficient documentation

## 2023-09-06 DIAGNOSIS — E669 Obesity, unspecified: Secondary | ICD-10-CM | POA: Diagnosis not present

## 2023-09-06 DIAGNOSIS — Z953 Presence of xenogenic heart valve: Secondary | ICD-10-CM

## 2023-09-06 DIAGNOSIS — Z7984 Long term (current) use of oral hypoglycemic drugs: Secondary | ICD-10-CM | POA: Insufficient documentation

## 2023-09-06 DIAGNOSIS — D509 Iron deficiency anemia, unspecified: Secondary | ICD-10-CM | POA: Insufficient documentation

## 2023-09-06 DIAGNOSIS — G4733 Obstructive sleep apnea (adult) (pediatric): Secondary | ICD-10-CM | POA: Insufficient documentation

## 2023-09-06 DIAGNOSIS — Z8673 Personal history of transient ischemic attack (TIA), and cerebral infarction without residual deficits: Secondary | ICD-10-CM

## 2023-09-06 DIAGNOSIS — I480 Paroxysmal atrial fibrillation: Secondary | ICD-10-CM | POA: Diagnosis not present

## 2023-09-06 DIAGNOSIS — I2510 Atherosclerotic heart disease of native coronary artery without angina pectoris: Secondary | ICD-10-CM | POA: Insufficient documentation

## 2023-09-06 DIAGNOSIS — I352 Nonrheumatic aortic (valve) stenosis with insufficiency: Secondary | ICD-10-CM | POA: Insufficient documentation

## 2023-09-06 DIAGNOSIS — J441 Chronic obstructive pulmonary disease with (acute) exacerbation: Secondary | ICD-10-CM | POA: Diagnosis not present

## 2023-09-06 DIAGNOSIS — I10 Essential (primary) hypertension: Secondary | ICD-10-CM

## 2023-09-06 DIAGNOSIS — Z7901 Long term (current) use of anticoagulants: Secondary | ICD-10-CM | POA: Insufficient documentation

## 2023-09-06 DIAGNOSIS — Z79899 Other long term (current) drug therapy: Secondary | ICD-10-CM | POA: Diagnosis not present

## 2023-09-06 DIAGNOSIS — I5022 Chronic systolic (congestive) heart failure: Secondary | ICD-10-CM

## 2023-09-06 DIAGNOSIS — I130 Hypertensive heart and chronic kidney disease with heart failure and stage 1 through stage 4 chronic kidney disease, or unspecified chronic kidney disease: Secondary | ICD-10-CM | POA: Insufficient documentation

## 2023-09-06 DIAGNOSIS — J449 Chronic obstructive pulmonary disease, unspecified: Secondary | ICD-10-CM | POA: Diagnosis not present

## 2023-09-06 DIAGNOSIS — I5042 Chronic combined systolic (congestive) and diastolic (congestive) heart failure: Secondary | ICD-10-CM | POA: Diagnosis not present

## 2023-09-06 DIAGNOSIS — N183 Chronic kidney disease, stage 3 unspecified: Secondary | ICD-10-CM | POA: Insufficient documentation

## 2023-09-06 DIAGNOSIS — I38 Endocarditis, valve unspecified: Secondary | ICD-10-CM | POA: Diagnosis present

## 2023-09-06 NOTE — Patient Instructions (Addendum)
Thank you for coming in today  If you had labs drawn today, any labs that are abnormal the clinic will call you No news is good news  Medications: No changes   You been recommended to try Silver Sneakers classes at your local [facility]   Follow up appointments:  Your physician recommends that you schedule a follow-up appointment in: 4 months With Dr. Earlean Shawl will receive a reminder letter in the mail a few months in advance. If you don't receive a letter, please call our office to schedule the follow-up appointment.     Do the following things EVERYDAY: Weigh yourself in the morning before breakfast. Write it down and keep it in a log. Take your medicines as prescribed Eat low salt foods--Limit salt (sodium) to 2000 mg per day.  Stay as active as you can everyday Limit all fluids for the day to less than 2 liters   At the [facility], you and your health needs are our priority. As part of our continuing mission to provide you with exceptional heart care, we have created designated Provider Care Teams. These Care Teams include your primary Cardiologist (physician) and Advanced Practice Providers (APPs- Physician Assistants and Nurse Practitioners) who all work together to provide you with the care you need, when you need it.   You may see any of the following providers on your designated Care Team at your next follow up: Dr Arvilla Meres Dr Marca Ancona Dr. Marcos Eke, NP Robbie Lis, [place] [facility], [place] Brynda Peon, NP Karle Plumber, PharmD   Please be sure to bring in all your medications bottles to every appointment.    Thank you for choosing [facility]  If you have any questions or concerns before your next appointment please send [place] a message through [place] or call our office at [phone].    TO LEAVE A MESSAGE FOR THE NURSE SELECT OPTION 2, PLEASE LEAVE A  MESSAGE INCLUDING: YOUR NAME DATE OF BIRTH CALL BACK NUMBER REASON FOR CALL**this is important as we prioritize the call backs  YOU WILL RECEIVE A CALL BACK THE SAME DAY AS LONG AS YOU CALL BEFORE 4:00 PM

## 2023-09-06 NOTE — Progress Notes (Signed)
ReDS Vest / Clip - 09/06/23 1210       ReDS Vest / Clip   Station Marker D    Ruler Value 39    ReDS Value Range Low volume    ReDS Actual Value 26    Anatomical Comments sitting

## 2023-09-06 NOTE — Progress Notes (Signed)
Advanced Heart Failure Clinic Note   PCP: Dr. Hyman Hopes Cardiology: Dr. Mayford Knife HF Cardiology: Dr. Shirlee Latch  77 y.o.with history of CVA, paroxysmal atrial fibrillation, valvular heart disease, and chronic diastolic CHF presents for evaluation of CHF and valvular heart disease.    He had had episodes of dyspnea and initial workup led to a TEE in 5/18 showing normal EF with moderate AS and moderate MR.  He continued to have episodic dyspnea and ended up admitted in 8/18 with shortness of breath and chest pressure.  This led to a long, complicated hospitalization.  He was noted to be volume overloaded with acute diastolic CHF and was also noted to have symptomatic runs of atrial fibrillation with RVR.  Amiodarone was started to control the atrial fibrillation.  He developed respiratory distress => Bipap => intubated.  He became hypotensive, requiring pressors.  He developed AKI.  PNA was noted and he received broad spectrum abx.  He had a prolonged intubation but was eventually extubated.  Given deconditioning from long hospitalization, he went to CIR for a couple of weeks.  LHC in 9/18 showed occluded PDA with left>right collaterals.  TEE in 9/18 showed EF 50%, severe MR (possibly infarct-related with restricted posterior leaflet, and moderate AS + moderate-severe AI (possibly rheumatic).   In 12/18, he had cardiac surgery with bioprosthetic aortic and mitral valves placed.  He had SVG-PDA, Maze, and LA appendage clipping.  Post-op course was complicated by CHB requiring Medtronic dual chamber PPM (His bundle lead placed but captured right bundle so induced LBBB).   Echo 01/02/18 LVEF 30-35%, bioprosthetic MV and AoV function normally, RV mildly dilated with normal systolic function.    He was noted to be in atrial flutter with mild RVR and had TEE-guided DCCV on 04/11/18. TEE showed EF 25-30%, normal bioprosthetic aortic and mitral valves.  Echo in 6/19 showed EF 30-35%, normal bioprosthetic aortic and mitral  valves.  Echo 12/19 with EF up to 50-55%, normal bioprosthetic mitral and aortic valves, normal RV.   In 2020, he went back into atrial fibrillation.  Amiodarone was started, and he returned to NSR.   Echo in 11/21 showed EF 50-55%, mild LVH, mildly dilated RV with normal function, bioprosthetic mitral valve with mean gradient 5 mmHg and no MR, bioprosthetic aortic valve with mean gradient 12 and no AI.  PFTs in 1/22 were restrictive.  High resolution CT chest was done in 2/22, concerning for ILD, usual interstitial pneumonitis.  He saw pulmonary, ESR was mildly elevated.  We decided to try him off amiodarone given concern for possible amiodarone-induced lung toxicity.  He then developed atrial fibrillation off amiodarone, was quite symptomatic.  He underwent TEE-guided DCCV back to NSR in 7/22 and amiodarone was restarted.  TEE showed EF 50-55%, mid septal HK, RV normal, bioprosthetic aortic and mitral valves appeared normal.   EP tried decreasing amiodarone to 100 mg daily.  However, patient then developed runs of atrial tachycardia (had to be paced out).  Amiodarone was increased back to 200 mg daily.   He had HRCT chest in 1/24, no clear ILD.  Saw Dr Isaiah Serge with pulmonary, though it was ok to continue amiodarone.   Echo 5/24 EF 50-55% with normal RV, PASP 33 mmHg, s/p bioprosthetic MVR (mean gradient 4 mmHg, no MR), s/p bioprosthetic AVR (mean gradient 9, no AI).   Today she returns for HF follow up with his wife. Overall feeling fine. He has SOB walking further distances on flat ground. Follows with Pulmonary.  Denies palpitations, CP, dizziness, edema, or PND/Orthopnea. Chronically sleeps in recliner. Appetite ok. No fever or chills. Weight at home 240-250 pounds. Taking all medications. Unable to tolerate CPAP, thinking about starting [employer]neakers exercise program.  ECG (personally reviewed): none ordered today.  ReDs: 26%  Device interrogation (personally reviewed): 87% a-paced, 1.1  hr/day activity  Labs (9/18): K 4.5, creatinine 1.42, LDL 90, HDL 36, LFTs normal, TSH normal, hgb 9.6 Labs (10/18): K 4.3, creatinine 1.45 Labs (12/18): K 3.8, creatinine 1.56 Labs (1/19): creatinine 1.68, LDL 66 Labs (3/19): K 4.2, creatinine 1.56 Labs (4/19): K 4.4, creatinine 1.71, hgb 11.8, LDL 75, HDL 43 Labs (6/19): K 4.8, creatinine 2.1, LDL 75, TGs 246 Labs (8/19): K 4.6, creatinine 2.07 Labs (11/19): K 4.6, creatinine 1.29 Labs (2/20): K 4.8, creatinine 1.98 Labs (5/20): LDL 49, HDL 39 Labs (7/20): TSH normal Labs (9/20): TSH normal, K 4.4, creatinine 2.03, hgb 13.5, LFTs normal Labs (12/20): K 4.4, creatinine 1.87, hgb 13.1.  Labs (11/21): LDL 54, K 4.6, creatinine 1.61, LFTs normal, TSH normal Labs (7/22): TSH normal, K 4.5, creatinine 2.98, hgb 13.7 Labs (8/22): K 4.4, creatinine 2.23 Labs (2/23): BNP 299 Labs (3/23): K 3.9, creatinine 2.21 Labs (5/23): K 4.4, creatinine 2.29 Labs (8/23): K 4.3, creatinine 2.37 Labs (12/23): LDL 51 Labs (1/24): K 4.8, creatinine 2.14 Labs (9/24): K 4.6, creatinine 2.11, LDL 65, A1C 6.0  PMH: 1. CVA: Right MCA, 2004.  Minimal residual problems.  2. HTN - Peripheral edema with amlodipine.  3. Hyperlipidemia 4. Left rotator cuff surgery 6/18 5. Carotid stenosis: s/p right carotid stent.  6. GERD 7. H/o CCY 8. Anemia: FOBT negative.  9. Gout  10. Atrial fibrillation: Paroxysmal. Maze in 12/18 with LAA clipping.  - Recurrent atrial fibrillation 2020 => amiodarone.  - Recurrent atrial fibrillation off amiodarone in 7/22, DCCV to NSR and amiodarone restarted.  11. Valvular heart disease: TEE (5/18) with EF 55-60%, moderate AS mean 33 mmHg, moderate MR, normal RV size and systolic function.  - Echo (8/18): EF 55-60%, moderate LVH, moderate AS with mean gradient 33 mmHg, moderate AI, moderate to severe MR.  - TEE (9/18): EF 50%, mild LV dilation, suspect severe MR with posterior leaflet restricted (?infarct-related MR), ERO 0.4 cm^2,  moderate AS/moderate-severe AI (possibly rheumatic).   - In 12/18, he had cardiac surgery with bioprosthetic aortic and mitral valves placed.  He had SVG-PDA, Maze, and LA appendage clipping. - Echo (1/19): LVEF 30-35%, bioprosthetic MV and AoV function normally, RV mildly dilated with normal systolic function.  - TEE (5/19) with EF 25-30%, normal bioprosthetic aortic valve, normal bioprosthetic mitral valve. - Echo (6/19): EF 30-35%, mild LV dilation with diffuse hypokinesis, normal RV size with mildly decreased systolic function, normal-appearing bioprosthetic mitral and aortic valves.   - Echo (12/19): EF 50-55%, moderate LVH, bioprosthetic aortic valve with mean gradient 10 mmHg, normally-functioning bioprosthetic mitral valve.  - Echo (11/21): EF 50-55%, mild LVH, mildly dilated RV with normal function, bioprosthetic mitral valve with mean gradient 5 mmHg and no MR, bioprosthetic aortic valve with mean gradient 12 and no AI.   - TEE (7/22): EF 50-55%, septal HK, normal RV size/systolic function, LA appendage clipped, bioprosthetic aortic and mitral valves appeared normal.  - Echo (5/24): EF 50-55% with normal RV, PASP 33 mmHg, s/p bioprosthetic MVR (mean gradient 4 mmHg, no MR), s/p bioprosthetic AVR (mean gradient 9, no AI).  12. Chronic systolic CHF 13. Deafness 14. CKD: stage 3.  15. Suspect COPD 16. 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  17. COPD: Mild obstruction on 9/18 PFTs.  - PFTs (10/20): Mild obstruction, decreased DLCO.  18. Post-op CHB in 12/18 with Medtronic dual chamber PPM.  19. Chronic systolic CHF: Echo (1/19) with EF down to 30-35% post-op.  - TEE (5/19) with EF 25-30%, normal bioprosthetic aortic valve, normal bioprosthetic mitral valve.  - Echo (12/19): EF 50-55%, moderate LVH, bioprosthetic aortic valve with mean gradient 10 mmHg,  normally-functioning bioprosthetic mitral valve.  20. Atrial flutter: DCCV in 5/19.  21. OSA: Not using CPAP.  22. Interstitial lung disease: PFTs in 1/22 concerning for restriction.  High resolution chest CT in 2/22 with possible UIP.  - HRCT chest (1/24): Not suggestive of ILD per Dr. Isaiah Serge.  23. Atrial tachycardia: Pace-terminated.   SH: Quit smoking 8/18.  Married, 2 children, retired.    Family History  Problem Relation Age of Onset   Stroke Father        No details   Angina Mother    Kidney cancer Brother    Review of systems complete and found to be negative unless listed in HPI.    Current Outpatient Medications  Medication Sig Dispense Refill   Accu-Chek Softclix Lancets lancets Use as directed to check blood sugar daily and as needed 100 each 3   acetaminophen (TYLENOL) 500 MG tablet Take 1,000 mg by mouth every 6 (six) hours as needed (pain.).     amiodarone (PACERONE) 200 MG tablet TAKE 1 TABLET (200 MG TOTAL) BY MOUTH DAILY. 90 tablet 2   Azelastine HCl 137 MCG/SPRAY SOLN Use 1 spray in each nostril 2 times a day (Patient taking differently: as needed.) 90 mL 3   Blood Glucose Monitoring Suppl (ACCU-CHEK GUIDE) w/Device KIT check your sugar once daily and as needed as directed 1 kit 0   calcium carbonate (TUMS - DOSED IN MG ELEMENTAL CALCIUM) 500 MG chewable tablet Chew 1 tablet by mouth daily as needed for indigestion or heartburn.     cetirizine (ZYRTEC) 10 MG tablet Take 10 mg by mouth daily as needed for allergies.     citalopram (CELEXA) 20 MG tablet Take 1 tablet (20 mg total) by mouth at bedtime. 30 tablet 5   docusate sodium (COLACE) 100 MG capsule Take 1 capsule (100 mg total) by mouth 3 (three) times daily as needed. 20 capsule 0   empagliflozin (JARDIANCE) 10 MG TABS tablet TAKE 1 TABLET BY MOUTH ONCE A DAY BEFORE BREAKFAST 90 tablet 3   fluticasone (FLONASE) 50 MCG/ACT nasal spray Place into both nostrils daily as needed for allergies or rhinitis.      furosemide (LASIX) 20 MG tablet TAKE 1 TABLET EVERY DAY 90 tablet 3   glucose blood (ACCU-CHEK GUIDE) test strip Use as directed to check blood sugar once daily and as needed 100 strip 3   loratadine (CLARITIN) 10 MG tablet Take 10 mg by mouth daily as needed for allergies or rhinitis.     methocarbamol (ROBAXIN) 500 MG tablet Take 1 tablet (500 mg total) by mouth every 8 (eight) hours as needed for muscle spasms. 30 tablet 0   pantoprazole (PROTONIX) 40 MG tablet Take 1 tablet (40 mg total) by mouth daily. 90 tablet 3   Pseudoeph-Doxylamine-DM-APAP (NYQUIL PO) Take 30 mLs by mouth daily as needed (Cold Symptons).     rosuvastatin (CRESTOR) 10 MG tablet TAKE 1  TABLET EVERY DAY 90 tablet 3   sacubitril-valsartan (ENTRESTO) 24-26 MG Take 1 tablet by mouth 2 (two) times daily. 180 tablet 3   spironolactone (ALDACTONE) 25 MG tablet TAKE 1 TABLET EVERY DAY 90 tablet 3   traZODone (DESYREL) 50 MG tablet Take 50-100 mg by mouth at bedtime.     warfarin (COUMADIN) 2.5 MG tablet TAKE 1 TO 1 AND 1/2 TABLETS DAILY OR AS DIRECTED BY THE COUMADIN CLINIC (Patient taking differently: TAKE1 tablet on Monday Wednesday Thursday Friday and Saturday 1 and1/2 TABLETS on Sundays and Tuesday  OR AS DIRECTED BY THE COUMADIN CLINIC) 115 tablet 3   No current facility-administered medications for this encounter.   BP 120/62   Pulse 60   Wt 114.7 kg (252 lb 12.8 oz)   SpO2 95%   BMI 35.26 kg/m   Wt Readings from Last 3 Encounters:  09/06/23 114.7 kg (252 lb 12.8 oz)  08/15/23 113.8 kg (250 lb 12.8 oz)  07/18/23 114.2 kg (251 lb 12.8 oz)   Physical Exam General:  NAD. No resp difficulty, walked into clinic HEENT: Normal Neck: Supple. No JVD. Carotids 2+ bilat; no bruits. No lymphadenopathy or thryomegaly appreciated. Cor: PMI nondisplaced. Regular rate & rhythm. No rubs, gallops or murmurs. Lungs: Clear, diminished in bases Abdomen: Soft, obese, nontender, nondistended. No hepatosplenomegaly. No bruits or  masses. Good bowel sounds. Extremities: No cyanosis, clubbing, rash, edema Neuro: Alert & oriented x 3, cranial nerves grossly intact. Moves all 4 extremities w/o difficulty. Affect pleasant.  Assessment/Plan: 1. Valvular heart disease: TEE in 9/18 showed severe MR, probably infarct-related with restrictive posterior mitral leaflet. There was moderate aortic stenosis and moderate-severe aortic insufficiency, possible rheumatic.  In 12/18, he had bioprosthetic MVR and AVR. Valves stable on most recent echo. - Antibiotic prophylaxis needed with dental work.  2. Chronic systolic => diastolic CHF: In setting of significant valvular disease as above. EF down to 30-35% post-op (1/19 echo), likely reflects true LV systolic function without the volume load from severe MR and moderate-severe AI.  TEE in 5/19 showed EF 25-30%.  Repeat echo in 6/19 with EF 30-35%. Repeat echo in 12/19 showed EF up to 50-55%, echo in 11/21 again showed EF 50-55% with normal RV systolic function.  TEE in 7/22 showed EF 50-55% with normal RV.  Echo today with EF 50-55%, normal RV.  He is not volume overloaded on exam.  NYHA class II symptoms, not volume overloaded on exam, ReDs 26%.   - Continue Entresto 24/26 bid, recent labs reviewed and are stable - Continue Lasix 20 mg daily.     - With wide PR interval, now off Coreg to avoid any increase in RV pacing.  - Continue spironolactone 25 mg daily. - Continue Jardiance 10 mg daily. No GU symptoms 3. CKD: Stage 3.  - As above, continue Jardiance.  - Most recent creatinine stable at 2.11 4. Atrial fibrillation/flutter: Paroxysmal. He has had a Maze procedure.  He tends to tolerate atrial arrhythmias poorly.  He had atrial flutter with DCCV in 5/19.  Recurrent atrial fibrillation in 2020.  Stopped amiodarone and went into atrial fibrillation in 6/22, very symptomatic.  Amiodarone restarted and he was cardioverted to NSR in 7/22.   - He is on warfarin. INR followed by coumadin clinic.   CBC today.  - Continue amiodarone 200 mg daily for now.  Check LFTs and TSH.  He will need regular eye exams.  CT chest in past showed possible early ILD, ESR was only  mildly elevated.  He saw pulmonary, not definite amiodarone-induced lung toxicity but need to follow closely.  Would prefer him off amiodarone, but he developed atrial tachycardia with RVR when we decreased the dose.  He had a repeat HRCT chest in 1/24 that per Dr Isaiah Serge did not show clear ILD.  5. COPD: Mild obstruction on 10/20 PFTs, decreased DLCO.  He has quit smoking.   - Follows with pulmonary.  6. CVA: Remote, minimal residual. S/p carotid stenting.  - No ASA given warfarin use.  7. Hyperlipidemia: Continue Crestor.  LDL good 9/24 8. Anemia: Fe deficiency. Most recent CBC reviewed and stable. 9. 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  The artery to the PDA was occluded with left to right collaterals.  He had SVG-PDA in 12/18. No chest pain.  - Continue statin.      - No ASA given warfarin use.  10. CHB: has Medtronic PPM.  He a-paces a high percentage of the time but there is minimal RV pacing.  - Now off Coreg as above, with very long PR.  11. OSA: Severe OSA, he does not want to use CPAP.  12. Obesity: Body mass index is 35.26 kg/m. - GLP1RAs are cost prohibitive, even with insurance 13. HTN: BP controlled.  14. Interstitial lung disease: Restrictive PFTs in 1/22.  High resolution CT chest concerning for ILD, possibly usual interstitial pneumonitis.  He saw pulmonary, could not rule out amiodarone lung toxicity so tried him off amiodarone.  He quickly went back into atrial fibrillation that was tolerated poorly, so amiodarone restarted.  We again tried to wean amiodarone but he developed atrial tachycardia with RVR.  Now back on amiodarone 200 mg daily. Repeat HRCT in 1/24 not clearly ILD per pulmonary.  -  Continue to followup with pulmonary.   Followup in 4 months with Dr. Kathreen Cornfield [facility] FNP-BC 09/06/2023

## 2023-09-07 DIAGNOSIS — Z1211 Encounter for screening for malignant neoplasm of colon: Secondary | ICD-10-CM | POA: Diagnosis not present

## 2023-09-11 ENCOUNTER — Encounter: Payer: Self-pay | Admitting: Podiatry

## 2023-09-11 ENCOUNTER — Ambulatory Visit (INDEPENDENT_AMBULATORY_CARE_PROVIDER_SITE_OTHER): Payer: Medicare HMO | Admitting: Podiatry

## 2023-09-11 DIAGNOSIS — Z7901 Long term (current) use of anticoagulants: Secondary | ICD-10-CM | POA: Diagnosis not present

## 2023-09-11 DIAGNOSIS — B351 Tinea unguium: Secondary | ICD-10-CM | POA: Diagnosis not present

## 2023-09-11 DIAGNOSIS — M79674 Pain in right toe(s): Secondary | ICD-10-CM

## 2023-09-11 DIAGNOSIS — M79675 Pain in left toe(s): Secondary | ICD-10-CM

## 2023-09-11 NOTE — Progress Notes (Signed)
Remote pacemaker transmission.   

## 2023-09-11 NOTE — Progress Notes (Signed)
This patient returns to my office for at risk foot care.  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  This patient is unable to cut nails himself since the patient cannot reach his nails.These nails are painful walking and wearing shoes.  This patient presents for at risk foot care today.  General Appearance  Alert, conversant and in no acute stress.  Vascular  Dorsalis pedis and posterior tibial  pulses are palpable  bilaterally.  Capillary return is within normal limits  bilaterally. Temperature is within normal limits  bilaterally.  Neurologic  Senn-Weinstein monofilament wire test within normal limits  bilaterally. Muscle power within normal limits bilaterally.  Nails Thick disfigured discolored nails with subungual debris  from hallux to fifth toes bilaterally. No evidence of bacterial infection or drainage bilaterally.  Orthopedic  No limitations of motion  feet .  No crepitus or effusions noted.  No bony pathology or digital deformities noted.  Skin  normotropic skin with no porokeratosis noted bilaterally.  No signs of infections or ulcers noted.   Asymptomatic heel callus.  Onychomycosis  Pain in right toes  Pain in left toes  Consent was obtained for treatment procedures.   Mechanical debridement of nails 1-5  bilaterally performed with a nail nipper.  Filed with dremel without incident.    Return office visit    3 months                  Told patient to return for periodic foot care and evaluation due to potential at risk complications.   Helane Gunther DPM

## 2023-09-13 ENCOUNTER — Ambulatory Visit: Payer: Medicare HMO | Attending: Cardiology

## 2023-09-13 DIAGNOSIS — Z953 Presence of xenogenic heart valve: Secondary | ICD-10-CM

## 2023-09-13 DIAGNOSIS — Z5181 Encounter for therapeutic drug level monitoring: Secondary | ICD-10-CM | POA: Diagnosis not present

## 2023-09-13 LAB — POCT INR: INR: 1.8 — AB (ref 2.0–3.0)

## 2023-09-13 NOTE — Patient Instructions (Signed)
Description   Today take 2 tablets of warfarin then START taking warfarin 1 tablet daily except 1.5 tablets on Sundays, Wednesdays, and  Fridays.  Stay consistent with greens (2-3 times per week).   Recheck INR in 3 weeks.  Coumadin Clinic [phone]. HAVE DENTAL OFFICE FAX CLEARANCE FORM TO CARDIOLOGY OFFICE.

## 2023-09-18 ENCOUNTER — Other Ambulatory Visit (HOSPITAL_COMMUNITY): Payer: Self-pay

## 2023-09-18 ENCOUNTER — Encounter (HOSPITAL_COMMUNITY): Payer: Self-pay

## 2023-09-19 ENCOUNTER — Other Ambulatory Visit (HOSPITAL_COMMUNITY): Payer: Self-pay

## 2023-09-19 ENCOUNTER — Telehealth (HOSPITAL_COMMUNITY): Payer: Self-pay | Admitting: Pharmacy Technician

## 2023-09-19 NOTE — Telephone Encounter (Signed)
Advanced Heart Failure Patient Advocate Encounter  The patient was approved for a [employer] grant that will help cover the cost of Entresto, Jardiance. Total amount awarded, $10,000. Eligibility, 08/24/23 - 08/22/24.  ID [id number]  BIN [id number]  PCN [id number]  Group [id number]

## 2023-09-21 ENCOUNTER — Other Ambulatory Visit (HOSPITAL_COMMUNITY): Payer: Self-pay

## 2023-09-21 NOTE — Telephone Encounter (Signed)
Advanced Heart Failure Patient Advocate Encounter  Added billing information into [facility]. Updated claim and sent patient mychart message confirming renewal.  Archer Asa, CPhT

## 2023-09-22 ENCOUNTER — Other Ambulatory Visit (HOSPITAL_COMMUNITY): Payer: Self-pay

## 2023-09-26 ENCOUNTER — Encounter: Payer: Self-pay | Admitting: Cardiology

## 2023-10-04 ENCOUNTER — Ambulatory Visit: Payer: Medicare HMO | Attending: Internal Medicine

## 2023-10-04 DIAGNOSIS — Z953 Presence of xenogenic heart valve: Secondary | ICD-10-CM | POA: Diagnosis not present

## 2023-10-04 DIAGNOSIS — Z5181 Encounter for therapeutic drug level monitoring: Secondary | ICD-10-CM

## 2023-10-04 LAB — POCT INR: INR: 4.5 — AB (ref 2.0–3.0)

## 2023-10-04 NOTE — Patient Instructions (Addendum)
Description   HOLD Warfarin today and only take 1/2 tablet tomorrow and then resume taking warfarin 1 tablet daily except 1.5 tablets on Sundays, Wednesdays, and  Fridays.  Stay consistent with greens (2-3 times per week).   Recheck INR in 2 weeks.  Coumadin Clinic [phone]. HAVE DENTAL OFFICE FAX CLEARANCE FORM TO CARDIOLOGY OFFICE.

## 2023-10-18 ENCOUNTER — Ambulatory Visit: Payer: Medicare HMO | Attending: Internal Medicine | Admitting: *Deleted

## 2023-10-18 DIAGNOSIS — Z5181 Encounter for therapeutic drug level monitoring: Secondary | ICD-10-CM

## 2023-10-18 DIAGNOSIS — Z9889 Other specified postprocedural states: Secondary | ICD-10-CM

## 2023-10-18 DIAGNOSIS — Z953 Presence of xenogenic heart valve: Secondary | ICD-10-CM | POA: Diagnosis not present

## 2023-10-18 DIAGNOSIS — Z8679 Personal history of other diseases of the circulatory system: Secondary | ICD-10-CM | POA: Diagnosis not present

## 2023-10-18 DIAGNOSIS — I480 Paroxysmal atrial fibrillation: Secondary | ICD-10-CM | POA: Diagnosis not present

## 2023-10-18 DIAGNOSIS — Z951 Presence of aortocoronary bypass graft: Secondary | ICD-10-CM

## 2023-10-18 LAB — POCT INR: INR: 3.3 — AB (ref 2.0–3.0)

## 2023-10-18 NOTE — Patient Instructions (Signed)
Description   Do not take any warfarin today then START taking warfarin 1 tablet daily except 1.5 tablets on Sundays and Wednesdays.  Stay consistent with greens (2-3 times per week).   Recheck INR in 3 weeks.  Coumadin Clinic [phone]. HAVE DENTAL OFFICE FAX CLEARANCE FORM TO CARDIOLOGY OFFICE.

## 2023-10-31 ENCOUNTER — Other Ambulatory Visit (HOSPITAL_COMMUNITY): Payer: Self-pay

## 2023-10-31 ENCOUNTER — Other Ambulatory Visit: Payer: Self-pay

## 2023-10-31 ENCOUNTER — Telehealth: Payer: Self-pay | Admitting: *Deleted

## 2023-10-31 NOTE — Telephone Encounter (Signed)
Wife called to state pt missed his 8pm-815pm dose of warfarin last night and since it is 810am to have pt to go ahead and take his dose now that he missed last night and tonight when he takes his dose take the regular dose that is due. She verbalized understanding and was thankful for the assistance advised he has 12 hours to take a missed dose.

## 2023-11-01 ENCOUNTER — Other Ambulatory Visit (HOSPITAL_COMMUNITY): Payer: Self-pay

## 2023-11-01 ENCOUNTER — Telehealth (HOSPITAL_COMMUNITY): Payer: Self-pay | Admitting: Pharmacy Technician

## 2023-11-01 NOTE — Telephone Encounter (Signed)
Advanced Heart Failure Patient Advocate Encounter  Received call from patients wife that they had a co-pay with the [facility]. Updated information with pharmacy, he has a grant through 08/2024.  Archer Asa, CPhT

## 2023-11-03 DIAGNOSIS — R7303 Prediabetes: Secondary | ICD-10-CM | POA: Diagnosis not present

## 2023-11-03 DIAGNOSIS — N189 Chronic kidney disease, unspecified: Secondary | ICD-10-CM | POA: Diagnosis not present

## 2023-11-03 DIAGNOSIS — I129 Hypertensive chronic kidney disease with stage 1 through stage 4 chronic kidney disease, or unspecified chronic kidney disease: Secondary | ICD-10-CM | POA: Diagnosis not present

## 2023-11-03 DIAGNOSIS — D631 Anemia in chronic kidney disease: Secondary | ICD-10-CM | POA: Diagnosis not present

## 2023-11-03 DIAGNOSIS — N183 Chronic kidney disease, stage 3 unspecified: Secondary | ICD-10-CM | POA: Diagnosis not present

## 2023-11-03 DIAGNOSIS — N2581 Secondary hyperparathyroidism of renal origin: Secondary | ICD-10-CM | POA: Diagnosis not present

## 2023-11-08 ENCOUNTER — Ambulatory Visit: Payer: Medicare HMO | Attending: Cardiology | Admitting: *Deleted

## 2023-11-08 DIAGNOSIS — Z5181 Encounter for therapeutic drug level monitoring: Secondary | ICD-10-CM | POA: Diagnosis not present

## 2023-11-08 DIAGNOSIS — Z953 Presence of xenogenic heart valve: Secondary | ICD-10-CM | POA: Diagnosis not present

## 2023-11-08 DIAGNOSIS — Z8679 Personal history of other diseases of the circulatory system: Secondary | ICD-10-CM | POA: Diagnosis not present

## 2023-11-08 DIAGNOSIS — Z951 Presence of aortocoronary bypass graft: Secondary | ICD-10-CM | POA: Diagnosis not present

## 2023-11-08 DIAGNOSIS — Z9889 Other specified postprocedural states: Secondary | ICD-10-CM

## 2023-11-08 DIAGNOSIS — I480 Paroxysmal atrial fibrillation: Secondary | ICD-10-CM | POA: Diagnosis not present

## 2023-11-08 LAB — POCT INR: INR: 2.6 (ref 2.0–3.0)

## 2023-11-08 NOTE — Patient Instructions (Signed)
Description   Continue taking warfarin 1 tablet daily except 1.5 tablets on Sundays and Wednesdays.  Stay consistent with greens (2-3 times per week).   Recheck INR in 4 weeks.  Coumadin Clinic [phone]. HAVE DENTAL OFFICE FAX CLEARANCE FORM TO CARDIOLOGY OFFICE.

## 2023-11-17 ENCOUNTER — Other Ambulatory Visit (HOSPITAL_COMMUNITY): Payer: Self-pay | Admitting: Cardiology

## 2023-11-30 ENCOUNTER — Ambulatory Visit (INDEPENDENT_AMBULATORY_CARE_PROVIDER_SITE_OTHER): Payer: Medicare HMO

## 2023-11-30 DIAGNOSIS — Z8673 Personal history of transient ischemic attack (TIA), and cerebral infarction without residual deficits: Secondary | ICD-10-CM | POA: Diagnosis not present

## 2023-11-30 LAB — CUP PACEART REMOTE DEVICE CHECK
Battery Remaining Longevity: 92 mo
Battery Voltage: 2.99 V
Brady Statistic AP VP Percent: 4.6 %
Brady Statistic AP VS Percent: 77.18 %
Brady Statistic AS VP Percent: 0.18 %
Brady Statistic AS VS Percent: 18.05 %
Brady Statistic RA Percent Paced: 81.84 %
Brady Statistic RV Percent Paced: 4.78 %
Date Time Interrogation Session: 20241218233358
Implantable Lead Connection Status: 753985
Implantable Lead Connection Status: 753985
Implantable Lead Implant Date: 20181211
Implantable Lead Implant Date: 20181211
Implantable Lead Location: 753859
Implantable Lead Location: 753860
Implantable Lead Model: 3830
Implantable Lead Model: 5076
Implantable Pulse Generator Implant Date: 20181211
Lead Channel Impedance Value: 285 Ohm
Lead Channel Impedance Value: 323 Ohm
Lead Channel Impedance Value: 380 Ohm
Lead Channel Impedance Value: 380 Ohm
Lead Channel Pacing Threshold Amplitude: 0.5 V
Lead Channel Pacing Threshold Amplitude: 1.25 V
Lead Channel Pacing Threshold Pulse Width: 0.4 ms
Lead Channel Pacing Threshold Pulse Width: 0.4 ms
Lead Channel Sensing Intrinsic Amplitude: 3 mV
Lead Channel Sensing Intrinsic Amplitude: 3 mV
Lead Channel Sensing Intrinsic Amplitude: 5.125 mV
Lead Channel Sensing Intrinsic Amplitude: 5.125 mV
Lead Channel Setting Pacing Amplitude: 2 V
Lead Channel Setting Pacing Amplitude: 2.5 V
Lead Channel Setting Pacing Pulse Width: 0.7 ms
Lead Channel Setting Sensing Sensitivity: 1.2 mV
Zone Setting Status: 755011
Zone Setting Status: 755011

## 2023-12-11 ENCOUNTER — Ambulatory Visit (INDEPENDENT_AMBULATORY_CARE_PROVIDER_SITE_OTHER): Payer: Medicare HMO | Admitting: Podiatry

## 2023-12-11 ENCOUNTER — Encounter: Payer: Self-pay | Admitting: Podiatry

## 2023-12-11 DIAGNOSIS — M79675 Pain in left toe(s): Secondary | ICD-10-CM

## 2023-12-11 DIAGNOSIS — M79674 Pain in right toe(s): Secondary | ICD-10-CM | POA: Diagnosis not present

## 2023-12-11 DIAGNOSIS — B351 Tinea unguium: Secondary | ICD-10-CM | POA: Diagnosis not present

## 2023-12-11 DIAGNOSIS — Z7901 Long term (current) use of anticoagulants: Secondary | ICD-10-CM

## 2023-12-11 NOTE — Progress Notes (Signed)
This patient returns to my office for at risk foot care.  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  This patient is unable to cut nails himself since the patient cannot reach his nails.These nails are painful walking and wearing shoes.  This patient presents for at risk foot care today.  General Appearance  Alert, conversant and in no acute stress.  Vascular  Dorsalis pedis and posterior tibial  pulses are palpable  bilaterally.  Capillary return is within normal limits  bilaterally. Temperature is within normal limits  bilaterally.  Neurologic  Senn-Weinstein monofilament wire test within normal limits  bilaterally. Muscle power within normal limits bilaterally.  Nails Thick disfigured discolored nails with subungual debris  from hallux to fifth toes bilaterally. No evidence of bacterial infection or drainage bilaterally.  Orthopedic  No limitations of motion  feet .  No crepitus or effusions noted.  No bony pathology or digital deformities noted.  Skin  normotropic skin with no porokeratosis noted bilaterally.  No signs of infections or ulcers noted.   Asymptomatic heel callus.  Onychomycosis  Pain in right toes  Pain in left toes  Consent was obtained for treatment procedures.   Mechanical debridement of nails 1-5  bilaterally performed with a nail nipper.  Filed with dremel without incident.    Return office visit    3 months                  Told patient to return for periodic foot care and evaluation due to potential at risk complications.   Helane Gunther DPM

## 2023-12-12 ENCOUNTER — Ambulatory Visit: Payer: Medicare HMO | Attending: Internal Medicine | Admitting: *Deleted

## 2023-12-12 DIAGNOSIS — Z953 Presence of xenogenic heart valve: Secondary | ICD-10-CM

## 2023-12-12 DIAGNOSIS — Z9889 Other specified postprocedural states: Secondary | ICD-10-CM | POA: Diagnosis not present

## 2023-12-12 DIAGNOSIS — Z8679 Personal history of other diseases of the circulatory system: Secondary | ICD-10-CM

## 2023-12-12 DIAGNOSIS — I480 Paroxysmal atrial fibrillation: Secondary | ICD-10-CM | POA: Diagnosis not present

## 2023-12-12 DIAGNOSIS — Z951 Presence of aortocoronary bypass graft: Secondary | ICD-10-CM

## 2023-12-12 DIAGNOSIS — Z5181 Encounter for therapeutic drug level monitoring: Secondary | ICD-10-CM | POA: Diagnosis not present

## 2023-12-12 LAB — POCT INR: INR: 2.1 (ref 2.0–3.0)

## 2023-12-12 NOTE — Patient Instructions (Signed)
 Description   Today take 1.5 tablets of warfarin then continue taking warfarin 1 tablet daily except 1.5 tablets on Sundays and Wednesdays.  Stay consistent with greens (2-3 times per week).   Recheck INR in 4 weeks.  Coumadin  Clinic [phone]. HAVE DENTAL OFFICE FAX CLEARANCE FORM TO CARDIOLOGY OFFICE.

## 2023-12-27 ENCOUNTER — Other Ambulatory Visit (HOSPITAL_COMMUNITY): Payer: Self-pay

## 2024-01-02 NOTE — Progress Notes (Signed)
Remote pacemaker transmission.   

## 2024-01-05 ENCOUNTER — Ambulatory Visit: Payer: Medicare HMO | Attending: Internal Medicine

## 2024-01-05 DIAGNOSIS — Z5181 Encounter for therapeutic drug level monitoring: Secondary | ICD-10-CM | POA: Diagnosis not present

## 2024-01-05 DIAGNOSIS — Z953 Presence of xenogenic heart valve: Secondary | ICD-10-CM | POA: Diagnosis not present

## 2024-01-05 LAB — POCT INR: INR: 2.2 (ref 2.0–3.0)

## 2024-01-05 NOTE — Patient Instructions (Signed)
Description   Continue taking warfarin 1 tablet daily except 1.5 tablets on Sundays and Wednesdays.  Stay consistent with greens (2-3 times per week).  Recheck INR in 5 weeks.  Coumadin Clinic [phone]. HAVE DENTAL OFFICE FAX CLEARANCE FORM TO CARDIOLOGY OFFICE.

## 2024-01-21 ENCOUNTER — Other Ambulatory Visit (HOSPITAL_COMMUNITY): Payer: Self-pay | Admitting: Cardiology

## 2024-01-22 ENCOUNTER — Other Ambulatory Visit (HOSPITAL_COMMUNITY): Payer: Self-pay

## 2024-01-28 NOTE — Progress Notes (Unsigned)
Electrophysiology Office Note:   Date:  01/29/2024  ID:  Brandon [name], DOB 11-Aug-1946, MRN [id number]  Primary Cardiologist: None Primary Heart Failure: Marca Ancona, MD Electrophysiologist: Regan Lemming, MD      History of Present Illness:   Brandon [name] is a 78 y.o. male with h/o CVA, atrial fibrillation, valvular heart disease, diastolic heart failure, coronary artery disease, complete heart block seen today for routine electrophysiology followup.   Since last being seen in our clinic the patient reports doing well.  He has no chest pain or shortness of breath.  He is able to do all of his daily activities without restriction.  He has no acute complaints at this time.  he denies chest pain, palpitations, dyspnea, PND, orthopnea, nausea, vomiting, dizziness, syncope, edema, weight gain, or early satiety.   Review of systems complete and found to be negative unless listed in HPI.      EP Information / Studies Reviewed:    EKG is ordered today. Personal review as below.  EKG Interpretation Date/Time:  Monday January 29 2024 10:35:38 EST Ventricular Rate:  63 PR Interval:  396 QRS Duration:  90 QT Interval:  464 QTC Calculation: 474 R Axis:   112  Text Interpretation: Sinus rhythm with 1st degree A-V block Low voltage QRS Septal infarct , age undetermined Lateral infarct , age undetermined When compared with ECG of 18-Apr-2023 15:08, No significant change since last tracing Confirmed by [facility], Dominico Rod ([id number]) on 01/29/2024 10:40:17 AM   PPM Interrogation-  reviewed in detail today,  See PACEART report.  Device History: Medtronic Dual Chamber PPM implanted 2018 for CHB  Risk Assessment/Calculations:    CHA2DS2-VASc Score = 6   This indicates a 9.7% annual risk of stroke. The patient's score is based upon: CHF History: 0 HTN History: 1 Diabetes History: 0 Stroke History: 2 Vascular Disease History: 1 Age Score: 2 Gender Score: 0             Physical  Exam:   VS:  BP 130/70 (BP Location: Left Arm, Patient Position: Sitting, Cuff Size: Large)   Pulse 63   Ht 5\' 11"  (1.803 m)   Wt 248 lb (112.5 kg)   SpO2 97%   BMI 34.59 kg/m    Wt Readings from Last 3 Encounters:  01/29/24 248 lb (112.5 kg)  09/06/23 252 lb 12.8 oz (114.7 kg)  08/15/23 250 lb 12.8 oz (113.8 kg)     GEN: Well nourished, well developed in no acute distress NECK: No JVD; No carotid bruits CARDIAC: Regular rate and rhythm, no murmurs, rubs, gallops RESPIRATORY:  Clear to auscultation without rales, wheezing or rhonchi  ABDOMEN: Soft, non-tender, non-distended EXTREMITIES:  No edema; No deformity   ASSESSMENT AND PLAN:    CHB s/p Medtronic PPM  Normal PPM function See Pace Art report Atrial and ventricular lead sensing, threshold, impedance within normal limits and stable from prior measurements Lower rate set to 50 as he is conducting at 55.  2.  Persistent atrial fibrillation/flutter: Post left atrial appendage clip and surgical maze.  Post ablation 12/16/2021.  Is in sinus rhythm.  Continue with current management.  3.  Secondary hypercoagulable state: Post left atrial appendage clip  4.  Chronic systolic heart failure: Ejection fraction is improved.  Continue heart failure therapy per primary cardiology  5.  Coronary artery disease: Post CABG.  Plan per primary cardiology  6.  Valvular heart disease: Post mitral and aortic valve replacements.  Plan per primary cardiology  7.  High risk medication monitoring: Currently on amiodarone.  Cherron Blitzer check TSH and LFTs today.  Disposition:   Follow up with EP APP in 6 months  Signed, Tyden Kann Jorja Loa, MD

## 2024-01-29 ENCOUNTER — Encounter: Payer: Self-pay | Admitting: Cardiology

## 2024-01-29 ENCOUNTER — Ambulatory Visit: Payer: Medicare HMO | Attending: Cardiology | Admitting: Cardiology

## 2024-01-29 VITALS — BP 130/70 | HR 63 | Ht 71.0 in | Wt 248.0 lb

## 2024-01-29 DIAGNOSIS — I442 Atrioventricular block, complete: Secondary | ICD-10-CM | POA: Diagnosis not present

## 2024-01-29 DIAGNOSIS — I484 Atypical atrial flutter: Secondary | ICD-10-CM

## 2024-01-29 DIAGNOSIS — I4819 Other persistent atrial fibrillation: Secondary | ICD-10-CM | POA: Diagnosis not present

## 2024-01-29 DIAGNOSIS — Z79899 Other long term (current) drug therapy: Secondary | ICD-10-CM

## 2024-01-29 DIAGNOSIS — I2510 Atherosclerotic heart disease of native coronary artery without angina pectoris: Secondary | ICD-10-CM | POA: Diagnosis not present

## 2024-01-29 DIAGNOSIS — D6869 Other thrombophilia: Secondary | ICD-10-CM | POA: Diagnosis not present

## 2024-01-29 LAB — CUP PACEART INCLINIC DEVICE CHECK
Date Time Interrogation Session: 20250217113048
Implantable Lead Connection Status: 753985
Implantable Lead Connection Status: 753985
Implantable Lead Implant Date: 20181211
Implantable Lead Implant Date: 20181211
Implantable Lead Location: 753859
Implantable Lead Location: 753860
Implantable Lead Model: 3830
Implantable Lead Model: 5076
Implantable Pulse Generator Implant Date: 20181211

## 2024-01-29 NOTE — Patient Instructions (Signed)
Medication Instructions:  NO changes. *If you need a refill on your cardiac medications before your next appointment, please call your pharmacy*   Lab Work: TSH and liver panel If you have labs (blood work) drawn today and your tests are completely normal, you will receive your results only by: MyChart Message (if you have MyChart) OR A paper copy in the mail If you have any lab test that is abnormal or we need to change your treatment, we will call you to review the results.   Testing/Procedures: None.     Follow-Up: At [facility], you and your health needs are our priority.  As part of our continuing mission to provide you with exceptional heart care, we have created designated Provider Care Teams.  These Care Teams include your primary Cardiologist (physician) and Advanced Practice Providers (APPs -  Physician Assistants and Nurse Practitioners) who all work together to provide you with the care you need, when you need it.  We recommend signing up for the patient portal called "MyChart".  Sign up information is provided on this After Visit Summary.  MyChart is used to connect with patients for Virtual Visits (Telemedicine).  Patients are able to view lab/test results, encounter notes, upcoming appointments, etc.  Non-urgent messages can be sent to your provider as well.   To learn more about what you can do with MyChart, go to [email or web address].    Your next appointment:   1 year(s)  Provider:   You will see one of the following Advanced Practice Providers on your designated Care Team:   Francis Dowse, Charlott Holler [address] [place], [place] Sherie Don, NP Canary Brim, NP

## 2024-01-30 ENCOUNTER — Other Ambulatory Visit (HOSPITAL_COMMUNITY): Payer: Self-pay

## 2024-01-30 DIAGNOSIS — L309 Dermatitis, unspecified: Secondary | ICD-10-CM | POA: Diagnosis not present

## 2024-01-30 DIAGNOSIS — R946 Abnormal results of thyroid function studies: Secondary | ICD-10-CM | POA: Diagnosis not present

## 2024-01-30 DIAGNOSIS — L299 Pruritus, unspecified: Secondary | ICD-10-CM | POA: Diagnosis not present

## 2024-01-30 LAB — HEPATIC FUNCTION PANEL
ALT: 13 [IU]/L (ref 0–44)
AST: 22 [IU]/L (ref 0–40)
Albumin: 4.3 g/dL (ref 3.8–4.8)
Alkaline Phosphatase: 39 [IU]/L — ABNORMAL LOW (ref 44–121)
Bilirubin Total: 0.3 mg/dL (ref 0.0–1.2)
Bilirubin, Direct: <0.08 mg/dL (ref 0.00–0.40)
Total Protein: 7.1 g/dL (ref 6.0–8.5)

## 2024-01-30 LAB — TSH: TSH: 5.32 u[IU]/mL — ABNORMAL HIGH (ref 0.450–4.500)

## 2024-01-30 MED ORDER — TRIAMCINOLONE ACETONIDE 0.1 % EX OINT
TOPICAL_OINTMENT | CUTANEOUS | 0 refills | Status: DC
Start: 2024-01-30 — End: 2024-02-20
  Filled 2024-01-30: qty 60, 21d supply, fill #0

## 2024-02-02 ENCOUNTER — Telehealth (HOSPITAL_COMMUNITY): Payer: Self-pay | Admitting: Cardiology

## 2024-02-02 NOTE — Telephone Encounter (Signed)
Called patient at [phone] to remind patient of his appointment on Monday 02/05/24 with Dr. Shirlee Latch at 11:20 AM.   [facility] office was unable to reach patient on this telephone number , unable to speak to patient directly.  Front office tried to leave patient a detailed voice message reminding him of his appt on Monday 02/05/24 but the voice message call dropped towards the end.   Front office staff unable to speak with patient and unsure if the voice message was received or not.

## 2024-02-04 ENCOUNTER — Other Ambulatory Visit: Payer: Self-pay | Admitting: Cardiology

## 2024-02-05 ENCOUNTER — Encounter (HOSPITAL_COMMUNITY): Payer: Self-pay | Admitting: Cardiology

## 2024-02-05 ENCOUNTER — Ambulatory Visit (HOSPITAL_COMMUNITY)
Admission: RE | Admit: 2024-02-05 | Discharge: 2024-02-05 | Disposition: A | Discharge status: Home / Self Care | Payer: Medicare HMO | Source: Ambulatory Visit | Attending: Cardiology | Admitting: Cardiology

## 2024-02-05 VITALS — BP 120/60 | HR 50 | Wt 248.6 lb

## 2024-02-05 DIAGNOSIS — E785 Hyperlipidemia, unspecified: Secondary | ICD-10-CM | POA: Diagnosis not present

## 2024-02-05 DIAGNOSIS — Z7984 Long term (current) use of oral hypoglycemic drugs: Secondary | ICD-10-CM | POA: Insufficient documentation

## 2024-02-05 DIAGNOSIS — Z7901 Long term (current) use of anticoagulants: Secondary | ICD-10-CM | POA: Insufficient documentation

## 2024-02-05 DIAGNOSIS — D509 Iron deficiency anemia, unspecified: Secondary | ICD-10-CM | POA: Insufficient documentation

## 2024-02-05 DIAGNOSIS — J849 Interstitial pulmonary disease, unspecified: Secondary | ICD-10-CM | POA: Insufficient documentation

## 2024-02-05 DIAGNOSIS — E669 Obesity, unspecified: Secondary | ICD-10-CM | POA: Diagnosis not present

## 2024-02-05 DIAGNOSIS — Z8673 Personal history of transient ischemic attack (TIA), and cerebral infarction without residual deficits: Secondary | ICD-10-CM | POA: Diagnosis not present

## 2024-02-05 DIAGNOSIS — Z6834 Body mass index (BMI) 34.0-34.9, adult: Secondary | ICD-10-CM | POA: Diagnosis not present

## 2024-02-05 DIAGNOSIS — Z953 Presence of xenogenic heart valve: Secondary | ICD-10-CM | POA: Insufficient documentation

## 2024-02-05 DIAGNOSIS — G4733 Obstructive sleep apnea (adult) (pediatric): Secondary | ICD-10-CM | POA: Insufficient documentation

## 2024-02-05 DIAGNOSIS — I4892 Unspecified atrial flutter: Secondary | ICD-10-CM | POA: Insufficient documentation

## 2024-02-05 DIAGNOSIS — M25562 Pain in left knee: Secondary | ICD-10-CM | POA: Insufficient documentation

## 2024-02-05 DIAGNOSIS — I130 Hypertensive heart and chronic kidney disease with heart failure and stage 1 through stage 4 chronic kidney disease, or unspecified chronic kidney disease: Secondary | ICD-10-CM | POA: Diagnosis not present

## 2024-02-05 DIAGNOSIS — J449 Chronic obstructive pulmonary disease, unspecified: Secondary | ICD-10-CM | POA: Diagnosis not present

## 2024-02-05 DIAGNOSIS — I2510 Atherosclerotic heart disease of native coronary artery without angina pectoris: Secondary | ICD-10-CM | POA: Insufficient documentation

## 2024-02-05 DIAGNOSIS — N183 Chronic kidney disease, stage 3 unspecified: Secondary | ICD-10-CM | POA: Diagnosis not present

## 2024-02-05 DIAGNOSIS — I5042 Chronic combined systolic (congestive) and diastolic (congestive) heart failure: Secondary | ICD-10-CM | POA: Diagnosis not present

## 2024-02-05 DIAGNOSIS — I5022 Chronic systolic (congestive) heart failure: Secondary | ICD-10-CM | POA: Diagnosis not present

## 2024-02-05 DIAGNOSIS — I480 Paroxysmal atrial fibrillation: Secondary | ICD-10-CM | POA: Diagnosis not present

## 2024-02-05 DIAGNOSIS — Z79899 Other long term (current) drug therapy: Secondary | ICD-10-CM | POA: Diagnosis not present

## 2024-02-05 DIAGNOSIS — I4819 Other persistent atrial fibrillation: Secondary | ICD-10-CM | POA: Diagnosis not present

## 2024-02-05 LAB — BASIC METABOLIC PANEL
Anion gap: 11 (ref 5–15)
BUN: 39 mg/dL — ABNORMAL HIGH (ref 8–23)
CO2: 20 mmol/L — ABNORMAL LOW (ref 22–32)
Calcium: 9.2 mg/dL (ref 8.9–10.3)
Chloride: 109 mmol/L (ref 98–111)
Creatinine, Ser: 2.76 mg/dL — ABNORMAL HIGH (ref 0.61–1.24)
GFR, Estimated: 23 mL/min — ABNORMAL LOW (ref >60)
Glucose, Bld: 111 mg/dL — ABNORMAL HIGH (ref 70–99)
Potassium: 4.7 mmol/L (ref 3.5–5.1)
Sodium: 140 mmol/L (ref 135–145)

## 2024-02-05 LAB — LIPID PANEL
Cholesterol: 115 mg/dL (ref 0–200)
HDL: 53 mg/dL (ref >40)
LDL Cholesterol: 44 mg/dL (ref 0–99)
Total CHOL/HDL Ratio: 2.2 {ratio}
Triglycerides: 89 mg/dL (ref <150)
VLDL: 18 mg/dL (ref 0–40)

## 2024-02-05 LAB — TSH: TSH: 4.711 u[IU]/mL — ABNORMAL HIGH (ref 0.350–4.500)

## 2024-02-05 LAB — T4, FREE: Free T4: 0.93 ng/dL (ref 0.61–1.12)

## 2024-02-05 MED ORDER — AMIODARONE HCL 200 MG PO TABS: 100.0000 mg | ORAL_TABLET | Freq: Every day | ORAL | 3 refills | Status: AC

## 2024-02-05 NOTE — Patient Instructions (Signed)
 DECREASE Amiodarone to 100 mg ( 1/2 tab) daily.  Labs done today, your results will be available in MyChart, we will contact you for abnormal readings.  You have been referred to the [facility] for Semaglutide. You will be called to have this appointment arranged.  Your physician recommends that you schedule a follow-up appointment in: 4 months.  If you have any questions or concerns before your next appointment please send [place] a message through [place] or call our office at [phone].    TO LEAVE A MESSAGE FOR THE NURSE SELECT OPTION 2, PLEASE LEAVE A MESSAGE INCLUDING: YOUR NAME DATE OF BIRTH CALL BACK NUMBER REASON FOR CALL**this is important as we prioritize the call backs  YOU WILL RECEIVE A CALL BACK THE SAME DAY AS LONG AS YOU CALL BEFORE 4:00 PM  At the [facility], you and your health needs are our priority. As part of our continuing mission to provide you with exceptional heart care, we have created designated Provider Care Teams. These Care Teams include your primary Cardiologist (physician) and Advanced Practice Providers (APPs- Physician Assistants and Nurse Practitioners) who all work together to provide you with the care you need, when you need it.   You may see any of the following providers on your designated Care Team at your next follow up: Dr Arvilla Meres Dr Marca Ancona Dr. Dorthula Nettles Dr. Clearnce Hasten Amy Filbert Schilder, NP Robbie Lis, [place] [facility] [place], [place] Brynda Peon, NP [place] Lee, NP Clarisa Kindred, NP Karle Plumber, PharmD Enos Fling, PharmD   Please be sure to bring in all your medications bottles to every appointment.    Thank you for choosing  [facility]

## 2024-02-05 NOTE — Progress Notes (Signed)
 [facility] Note   PCP: Dr. Hyman Hopes Cardiology: Dr. Mayford Knife HF Cardiology: Dr. Shirlee Latch  Chief complaint: CHF  78 y.o.with history of CVA, paroxysmal atrial fibrillation, valvular heart disease, and chronic diastolic CHF presents for evaluation of CHF and valvular heart disease.    He had had episodes of dyspnea and initial workup led to a TEE in 5/18 showing normal EF with moderate AS and moderate MR.  He continued to have episodic dyspnea and ended up admitted in 8/18 with shortness of breath and chest pressure.  This led to a long, complicated hospitalization.  He was noted to be volume overloaded with acute diastolic CHF and was also noted to have symptomatic runs of atrial fibrillation with RVR.  Amiodarone was started to control the atrial fibrillation.  He developed respiratory distress => Bipap => intubated.  He became hypotensive, requiring pressors.  He developed AKI.  PNA was noted and he received broad spectrum abx.  He had a prolonged intubation but was eventually extubated.  Given deconditioning from long hospitalization, he went to CIR for a couple of weeks.  LHC in 9/18 showed occluded PDA with left>right collaterals.  TEE in 9/18 showed EF 50%, severe MR (possibly infarct-related with restricted posterior leaflet, and moderate AS + moderate-severe AI (possibly rheumatic).   In 12/18, he had cardiac surgery with bioprosthetic aortic and mitral valves placed.  He had SVG-PDA, Maze, and LA appendage clipping.  Post-op course was complicated by CHB requiring Medtronic dual chamber PPM (His bundle lead placed but captured right bundle so induced LBBB).   Echo 01/02/18 LVEF 30-35%, bioprosthetic MV and AoV function normally, RV mildly dilated with normal systolic function.    He was noted to be in atrial flutter with mild RVR and had TEE-guided DCCV on 04/11/18. TEE showed EF 25-30%, normal bioprosthetic aortic and mitral valves.  Echo in 6/19 showed EF 30-35%, normal  bioprosthetic aortic and mitral valves.  Echo 12/19 with EF up to 50-55%, normal bioprosthetic mitral and aortic valves, normal RV.   In 2020, he went back into atrial fibrillation.  Amiodarone was started, and he returned to NSR.   Echo in 11/21 showed EF 50-55%, mild LVH, mildly dilated RV with normal function, bioprosthetic mitral valve with mean gradient 5 mmHg and no MR, bioprosthetic aortic valve with mean gradient 12 and no AI.  PFTs in 1/22 were restrictive.  High resolution CT chest was done in 2/22, concerning for ILD, usual interstitial pneumonitis.  He saw pulmonary, ESR was mildly elevated.  We decided to try him off amiodarone given concern for possible amiodarone-induced lung toxicity.  He then developed atrial fibrillation off amiodarone, was quite symptomatic.  He underwent TEE-guided DCCV back to NSR in 7/22 and amiodarone was restarted.  TEE showed EF 50-55%, mid septal HK, RV normal, bioprosthetic aortic and mitral valves appeared normal.   EP tried decreasing amiodarone to 100 mg daily.  However, patient then developed runs of atrial tachycardia (had to be paced out).  Amiodarone was increased back to 200 mg daily.   He had HRCT chest in 1/24, no clear ILD.  Saw Dr Isaiah Serge with pulmonary, though it was ok to continue amiodarone.   Echo 5/24 EF 50-55% with normal RV, PASP 33 mmHg, s/p bioprosthetic MVR (mean gradient 4 mmHg, no MR), s/p bioprosthetic AVR (mean gradient 9, no AI).   Today she returns for HF follow up with his wife. Weight down 4 lbs.  He has been using a stationary  bike at home for exercise. He walks with a cane.  No palpitations.  No dyspnea but not very active due to left knee pain.  No lightheadedness or syncope.    ECG (personally reviewed):NSR, long 1st degree AVB (432 msec), IVCD 138 msec.   Labs (9/18): K 4.5, creatinine 1.42, LDL 90, HDL 36, LFTs normal, TSH normal, hgb 9.6 Labs (10/18): K 4.3, creatinine 1.45 Labs (12/18): K 3.8, creatinine 1.56 Labs  (1/19): creatinine 1.68, LDL 66 Labs (3/19): K 4.2, creatinine 1.56 Labs (4/19): K 4.4, creatinine 1.71, hgb 11.8, LDL 75, HDL 43 Labs (6/19): K 4.8, creatinine 2.1, LDL 75, TGs 246 Labs (8/19): K 4.6, creatinine 2.07 Labs (11/19): K 4.6, creatinine 1.29 Labs (2/20): K 4.8, creatinine 1.98 Labs (5/20): LDL 49, HDL 39 Labs (7/20): TSH normal Labs (9/20): TSH normal, K 4.4, creatinine 2.03, hgb 13.5, LFTs normal Labs (12/20): K 4.4, creatinine 1.87, hgb 13.1.  Labs (11/21): LDL 54, K 4.6, creatinine 1.61, LFTs normal, TSH normal Labs (7/22): TSH normal, K 4.5, creatinine 2.98, hgb 13.7 Labs (8/22): K 4.4, creatinine 2.23 Labs (2/23): BNP 299 Labs (3/23): K 3.9, creatinine 2.21 Labs (5/23): K 4.4, creatinine 2.29 Labs (8/23): K 4.3, creatinine 2.37 Labs (12/23): LDL 51 Labs (1/24): K 4.8, creatinine 2.14 Labs (9/24): K 4.6, creatinine 2.11, LDL 65, A1C 6.0 Labs (2/25): TSH 5.3, LFTs normal  PMH: 1. CVA: Right MCA, 2004.  Minimal residual problems.  2. HTN - Peripheral edema with amlodipine.  3. Hyperlipidemia 4. Left rotator cuff surgery 6/18 5. Carotid stenosis: s/p right carotid stent.  6. GERD 7. H/o CCY 8. Anemia: FOBT negative.  9. Gout  10. Atrial fibrillation: Paroxysmal. Maze in 12/18 with LAA clipping.  - Recurrent atrial fibrillation 2020 => amiodarone.  - Recurrent atrial fibrillation off amiodarone in 7/22, DCCV to NSR and amiodarone restarted.  11. Valvular heart disease: TEE (5/18) with EF 55-60%, moderate AS mean 33 mmHg, moderate MR, normal RV size and systolic function.  - Echo (8/18): EF 55-60%, moderate LVH, moderate AS with mean gradient 33 mmHg, moderate AI, moderate to severe MR.  - TEE (9/18): EF 50%, mild LV dilation, suspect severe MR with posterior leaflet restricted (?infarct-related MR), ERO 0.4 cm^2, moderate AS/moderate-severe AI (possibly rheumatic).   - In 12/18, he had cardiac surgery with bioprosthetic aortic and mitral valves placed.  He had  SVG-PDA, Maze, and LA appendage clipping. - Echo (1/19): LVEF 30-35%, bioprosthetic MV and AoV function normally, RV mildly dilated with normal systolic function.  - TEE (5/19) with EF 25-30%, normal bioprosthetic aortic valve, normal bioprosthetic mitral valve. - Echo (6/19): EF 30-35%, mild LV dilation with diffuse hypokinesis, normal RV size with mildly decreased systolic function, normal-appearing bioprosthetic mitral and aortic valves.   - Echo (12/19): EF 50-55%, moderate LVH, bioprosthetic aortic valve with mean gradient 10 mmHg, normally-functioning bioprosthetic mitral valve.  - Echo (11/21): EF 50-55%, mild LVH, mildly dilated RV with normal function, bioprosthetic mitral valve with mean gradient 5 mmHg and no MR, bioprosthetic aortic valve with mean gradient 12 and no AI.   - TEE (7/22): EF 50-55%, septal HK, normal RV size/systolic function, LA appendage clipped, bioprosthetic aortic and mitral valves appeared normal.  - Echo (5/24): EF 50-55% with normal RV, PASP 33 mmHg, s/p bioprosthetic MVR (mean gradient 4 mmHg, no MR), s/p bioprosthetic AVR (mean gradient 9, no AI).  12. Chronic systolic CHF 13. Deafness 14. CKD: stage 3.  15. Suspect COPD 16. 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  17. COPD: Mild obstruction on 9/18 PFTs.  - PFTs (10/20): Mild obstruction, decreased DLCO.  18. Post-op CHB in 12/18 with Medtronic dual chamber PPM.  19. Chronic systolic CHF: Echo (1/19) with EF down to 30-35% post-op.  - TEE (5/19) with EF 25-30%, normal bioprosthetic aortic valve, normal bioprosthetic mitral valve.  - Echo (12/19): EF 50-55%, moderate LVH, bioprosthetic aortic valve with mean gradient 10 mmHg, normally-functioning bioprosthetic mitral valve.  20. Atrial flutter: DCCV in 5/19.  21. OSA: Not using CPAP.  22. Interstitial lung disease: PFTs in 1/22  concerning for restriction.  High resolution chest CT in 2/22 with possible UIP.  - HRCT chest (1/24): Not suggestive of ILD per Dr. Isaiah Serge.  23. Atrial tachycardia: Pace-terminated.   SH: Quit smoking 8/18.  Married, 2 children, retired.    Family History  Problem Relation Age of Onset   Stroke Father        No details   Angina Mother    Kidney cancer Brother    Review of systems complete and found to be negative unless listed in HPI.    Current Outpatient Medications  Medication Sig Dispense Refill   Accu-Chek Softclix Lancets lancets Use as directed to check blood sugar daily and as needed 100 each 3   acetaminophen (TYLENOL) 500 MG tablet Take 1,000 mg by mouth every 6 (six) hours as needed (pain.).     Blood Glucose Monitoring Suppl (ACCU-CHEK GUIDE) w/Device KIT check your sugar once daily and as needed as directed 1 kit 0   calcium carbonate (TUMS - DOSED IN MG ELEMENTAL CALCIUM) 500 MG chewable tablet Chew 1 tablet by mouth daily as needed for indigestion or heartburn.     Cholecalciferol (VITAMIN D3) 50 MCG (2000 UT) capsule Take 2,000 Units by mouth daily.     citalopram (CELEXA) 20 MG tablet Take 1 tablet (20 mg total) by mouth at bedtime. 30 tablet 5   docusate sodium (COLACE) 100 MG capsule Take 1 capsule (100 mg total) by mouth 3 (three) times daily as needed. 20 capsule 0   empagliflozin (JARDIANCE) 10 MG TABS tablet TAKE 1 TABLET BY MOUTH ONCE A DAY BEFORE BREAKFAST 90 tablet 3   furosemide (LASIX) 20 MG tablet TAKE 1 TABLET EVERY DAY 90 tablet 3   glucose blood (ACCU-CHEK GUIDE) test strip Use as directed to check blood sugar once daily and as needed 100 strip 3   loratadine (CLARITIN) 10 MG tablet Take 10 mg by mouth daily as needed for allergies or rhinitis.     methocarbamol (ROBAXIN) 500 MG tablet Take 1 tablet (500 mg total) by mouth every 8 (eight) hours as needed for muscle spasms. 30 tablet 0   pantoprazole (PROTONIX) 40 MG tablet Take 1 tablet (40 mg total) by  mouth daily. 90 tablet 3   Pseudoeph-Doxylamine-DM-APAP (NYQUIL PO) Take 30 mLs by mouth daily as needed (Cold Symptons).     rosuvastatin (CRESTOR) 10 MG tablet TAKE 1 TABLET EVERY DAY 90 tablet 3   sacubitril-valsartan (ENTRESTO) 24-26 MG Take 1 tablet by mouth 2 (two) times daily. 180 tablet 3   spironolactone (ALDACTONE) 25 MG tablet TAKE 1 TABLET EVERY DAY 90 tablet 3   traZODone (DESYREL) 50 MG tablet Take 100 mg by mouth at bedtime.     triamcinolone ointment (KENALOG) 0.1 % Apply one application Externally Twice a day for  21 days 60 g 0   warfarin (COUMADIN) 2.5 MG tablet TAKE 1 TO 1 AND 1/2 TABLETS DAILY OR AS DIRECTED BY THE COUMADIN CLINIC (Patient taking differently: TAKE1 tablet on Monday Wednesday Thursday Friday and Saturday 1 and1/2 TABLETS on Sundays and Tuesday  OR AS DIRECTED BY THE COUMADIN CLINIC) 115 tablet 3   amiodarone (PACERONE) 200 MG tablet Take 0.5 tablets (100 mg total) by mouth daily. 45 tablet 3   No current facility-administered medications for this encounter.   BP 120/60   Pulse (!) 50   Wt 112.8 kg (248 lb 9.6 oz)   SpO2 98%   BMI 34.67 kg/m   Wt Readings from Last 3 Encounters:  02/05/24 112.8 kg (248 lb 9.6 oz)  01/29/24 112.5 kg (248 lb)  09/06/23 114.7 kg (252 lb 12.8 oz)   General: NAD Neck: No JVD, no thyromegaly or thyroid nodule.  Lungs: Clear to auscultation bilaterally with normal respiratory effort. CV: Nondisplaced PMI.  Heart regular S1/S2, no S3/S4, 1/6 SEM RUSB.  No peripheral edema.  No carotid bruit.  Normal pedal pulses.  Abdomen: Soft, nontender, no hepatosplenomegaly, no distention.  Skin: Intact without lesions or rashes.  Neurologic: Alert and oriented x 3.  Psych: Normal affect. Extremities: No clubbing or cyanosis.  HEENT: Normal.   Assessment/Plan: 1. Valvular heart disease: TEE in 9/18 showed severe MR, probably infarct-related with restrictive posterior mitral leaflet. There was moderate aortic stenosis and  moderate-severe aortic insufficiency, possible rheumatic.  In 12/18, he had bioprosthetic MVR and AVR. Valves stable on most recent echo in 5/24. - Antibiotic prophylaxis needed with dental work.  2. Chronic systolic => diastolic CHF: In setting of significant valvular disease as above. EF down to 30-35% post-op (1/19 echo), likely reflects true LV systolic function without the volume load from severe MR and moderate-severe AI.  TEE in 5/19 showed EF 25-30%.  Repeat echo in 6/19 with EF 30-35%. Repeat echo in 12/19 showed EF up to 50-55%, echo in 11/21 again showed EF 50-55% with normal RV systolic function.  TEE in 7/22 showed EF 50-55% with normal RV.  Echo in 5/24 with EF 50-55%, normal RV.  NYHA class II symptoms, not volume overloaded on exam.  - Continue Entresto 24/26 bid,  - Continue Lasix 20 mg daily. BMET today.  - With wide PR interval, now off Coreg to avoid any increase in RV pacing.  - Continue spironolactone 25 mg daily. - Continue Jardiance 10 mg daily.  3. CKD: Stage 3.  - As above, continue Jardiance.  4. Atrial fibrillation/flutter: Paroxysmal. He has had a Maze procedure.  He tends to tolerate atrial arrhythmias poorly.  He had atrial flutter with DCCV in 5/19.  Recurrent atrial fibrillation in 2020.  Stopped amiodarone and went into atrial fibrillation in 6/22, very symptomatic.  Amiodarone restarted and he was cardioverted to NSR in 7/22.   - He is on warfarin. INR followed by coumadin clinic.   - With markedly wide 1st degree AVB, I will decrease amiodarone to 100 mg daily..  Check LFTs and TSH.  He will need regular eye exams.  CT chest in past showed possible early ILD, ESR was only mildly elevated.  He saw pulmonary, not definite amiodarone-induced lung toxicity but need to follow closely.  Would prefer him off amiodarone, but he developed atrial tachycardia with RVR when we decreased the dose.  He will have a repeat HRCT chest in 3/25 with Dr. Isaiah Serge.  5. COPD: Mild  obstruction on  10/20 PFTs, decreased DLCO.  He has quit smoking.   - Follows with pulmonary.  6. CVA: Remote, minimal residual. S/p carotid stenting.  - No ASA given warfarin use.  7. Hyperlipidemia: Continue Crestor.  LDL good 9/24 8. Anemia: Fe deficiency.  9. 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  The artery providing the PDA was occluded with left to right collaterals.  He had SVG-PDA in 12/18. No chest pain.  - Continue statin.      - No ASA given warfarin use.  10. CHB: has Medtronic PPM.  He a-paces a high percentage of the time but there is minimal RV pacing.  - Now off Coreg as above and cutting back amiodarone with very long PR.  11. OSA: Severe OSA, he does not want to use CPAP.  12. Obesity: Body mass index is 34.67 kg/m. - I will see if we can get semaglutide or tirzepatide for him, refer to pharmacy clinic.  13. HTN: BP controlled.  14. Interstitial lung disease: Restrictive PFTs in 1/22.  High resolution CT chest concerning for ILD, possibly usual interstitial pneumonitis.  He saw pulmonary, could not rule out amiodarone lung toxicity so tried him off amiodarone.  He quickly went back into atrial fibrillation that was tolerated poorly, so amiodarone restarted.  We again tried to wean amiodarone but he developed atrial tachycardia with RVR.  Now back on amiodarone 200 mg daily. Repeat HRCT in 1/24 not clearly ILD per pulmonary.  - Continue to followup with pulmonary.  Repeat HRCT chest in 3/25.  - Decrease amiodarone to 100 mg daily as above.   Followup in 4 months APP  I spent 31 minutes reviewing data, interviewing patient, and organizing the orders/followup.   Brandon [name]  02/05/2024

## 2024-02-06 ENCOUNTER — Telehealth (HOSPITAL_COMMUNITY): Payer: Self-pay | Admitting: *Deleted

## 2024-02-06 ENCOUNTER — Other Ambulatory Visit (HOSPITAL_COMMUNITY): Payer: Self-pay

## 2024-02-06 DIAGNOSIS — I5022 Chronic systolic (congestive) heart failure: Secondary | ICD-10-CM

## 2024-02-06 MED ORDER — FUROSEMIDE 20 MG PO TABS
20.0000 mg | ORAL_TABLET | ORAL | 3 refills | Status: DC
  Filled 2024-02-06: qty 45, 90d supply, fill #0

## 2024-02-06 NOTE — Telephone Encounter (Signed)
 Called patient's wife per Dr. Shirlee Latch with following: "Creatinine higher, decrease Lasix to 20 mg every other day, BMET 10 days.  Lipids ok.  TSH mildly elevated with normal free T4, no changes." Wife verbalized understanding of same. Medication list updated, lab ordered and scheduled.

## 2024-02-07 ENCOUNTER — Telehealth: Payer: Self-pay | Admitting: *Deleted

## 2024-02-07 LAB — T3, FREE: T3, Free: 2.2 pg/mL (ref 2.0–4.4)

## 2024-02-07 NOTE — Telephone Encounter (Signed)
 Wife called to let us know that pt's lasix has been changed to every other day from daily. Also, to report nosebleeds and she think it is from the dryness and has been to stop them. She states the amiodarone was decreased as well and she had already reported it this week.

## 2024-02-09 ENCOUNTER — Ambulatory Visit: Payer: Medicare HMO | Attending: Internal Medicine

## 2024-02-09 DIAGNOSIS — Z953 Presence of xenogenic heart valve: Secondary | ICD-10-CM | POA: Diagnosis not present

## 2024-02-09 DIAGNOSIS — Z5181 Encounter for therapeutic drug level monitoring: Secondary | ICD-10-CM

## 2024-02-09 DIAGNOSIS — I480 Paroxysmal atrial fibrillation: Secondary | ICD-10-CM

## 2024-02-09 DIAGNOSIS — Z8679 Personal history of other diseases of the circulatory system: Secondary | ICD-10-CM

## 2024-02-09 DIAGNOSIS — Z951 Presence of aortocoronary bypass graft: Secondary | ICD-10-CM

## 2024-02-09 LAB — POCT INR: INR: 2.5 (ref 2.0–3.0)

## 2024-02-09 NOTE — Patient Instructions (Signed)
 Continue taking warfarin 1 tablet daily except 1.5 tablets on Sundays and Wednesdays.  Stay consistent with greens (2-3 times per week).  Recheck INR in 5 weeks.  Coumadin Clinic [phone]. HAVE DENTAL OFFICE FAX CLEARANCE FORM TO CARDIOLOGY OFFICE.

## 2024-02-13 ENCOUNTER — Ambulatory Visit
Admission: RE | Admit: 2024-02-13 | Discharge: 2024-02-13 | Disposition: A | Discharge status: Home / Self Care | Payer: Medicare HMO | Source: Ambulatory Visit | Attending: Pulmonary Disease

## 2024-02-13 DIAGNOSIS — R599 Enlarged lymph nodes, unspecified: Secondary | ICD-10-CM | POA: Diagnosis not present

## 2024-02-13 DIAGNOSIS — J849 Interstitial pulmonary disease, unspecified: Secondary | ICD-10-CM

## 2024-02-13 DIAGNOSIS — J90 Pleural effusion, not elsewhere classified: Secondary | ICD-10-CM | POA: Diagnosis not present

## 2024-02-13 DIAGNOSIS — R918 Other nonspecific abnormal finding of lung field: Secondary | ICD-10-CM | POA: Diagnosis not present

## 2024-02-13 DIAGNOSIS — I7121 Aneurysm of the ascending aorta, without rupture: Secondary | ICD-10-CM | POA: Diagnosis not present

## 2024-02-19 ENCOUNTER — Ambulatory Visit (HOSPITAL_COMMUNITY)
Admission: RE | Admit: 2024-02-19 | Discharge: 2024-02-19 | Disposition: A | Discharge status: Home / Self Care | Payer: Medicare HMO | Source: Ambulatory Visit | Attending: Cardiology | Admitting: Cardiology

## 2024-02-19 DIAGNOSIS — I5022 Chronic systolic (congestive) heart failure: Secondary | ICD-10-CM | POA: Diagnosis not present

## 2024-02-19 LAB — BASIC METABOLIC PANEL
Anion gap: 9 (ref 5–15)
BUN: 32 mg/dL — ABNORMAL HIGH (ref 8–23)
CO2: 19 mmol/L — ABNORMAL LOW (ref 22–32)
Calcium: 8.9 mg/dL (ref 8.9–10.3)
Chloride: 107 mmol/L (ref 98–111)
Creatinine, Ser: 2.39 mg/dL — ABNORMAL HIGH (ref 0.61–1.24)
GFR, Estimated: 27 mL/min — ABNORMAL LOW (ref >60)
Glucose, Bld: 99 mg/dL (ref 70–99)
Potassium: 4.5 mmol/L (ref 3.5–5.1)
Sodium: 135 mmol/L (ref 135–145)

## 2024-02-20 ENCOUNTER — Other Ambulatory Visit (HOSPITAL_COMMUNITY): Payer: Self-pay

## 2024-02-20 DIAGNOSIS — E1122 Type 2 diabetes mellitus with diabetic chronic kidney disease: Secondary | ICD-10-CM | POA: Diagnosis not present

## 2024-02-20 DIAGNOSIS — R21 Rash and other nonspecific skin eruption: Secondary | ICD-10-CM | POA: Diagnosis not present

## 2024-02-20 DIAGNOSIS — R946 Abnormal results of thyroid function studies: Secondary | ICD-10-CM | POA: Diagnosis not present

## 2024-02-20 DIAGNOSIS — I5032 Chronic diastolic (congestive) heart failure: Secondary | ICD-10-CM | POA: Diagnosis not present

## 2024-02-20 DIAGNOSIS — I480 Paroxysmal atrial fibrillation: Secondary | ICD-10-CM | POA: Diagnosis not present

## 2024-02-20 DIAGNOSIS — I10 Essential (primary) hypertension: Secondary | ICD-10-CM | POA: Diagnosis not present

## 2024-02-20 DIAGNOSIS — E782 Mixed hyperlipidemia: Secondary | ICD-10-CM | POA: Diagnosis not present

## 2024-02-20 DIAGNOSIS — N184 Chronic kidney disease, stage 4 (severe): Secondary | ICD-10-CM | POA: Diagnosis not present

## 2024-02-20 MED ORDER — TRIAMCINOLONE ACETONIDE 0.1 % EX OINT
1.0000 | TOPICAL_OINTMENT | Freq: Two times a day (BID) | CUTANEOUS | 0 refills | Status: DC
Start: 2024-02-20 — End: 2024-08-07
  Filled 2024-02-20: qty 60, 21d supply, fill #0

## 2024-02-21 ENCOUNTER — Other Ambulatory Visit (HOSPITAL_COMMUNITY): Payer: Self-pay

## 2024-02-29 ENCOUNTER — Ambulatory Visit: Payer: Medicare HMO

## 2024-02-29 DIAGNOSIS — I442 Atrioventricular block, complete: Secondary | ICD-10-CM | POA: Diagnosis not present

## 2024-03-01 LAB — CUP PACEART REMOTE DEVICE CHECK
Battery Remaining Longevity: 93 mo
Battery Voltage: 2.99 V
Brady Statistic AP VP Percent: 0.99 %
Brady Statistic AP VS Percent: 46.07 %
Brady Statistic AS VP Percent: 0.14 %
Brady Statistic AS VS Percent: 52.8 %
Brady Statistic RA Percent Paced: 47.11 %
Brady Statistic RV Percent Paced: 1.13 %
Date Time Interrogation Session: 20250319220253
Implantable Lead Connection Status: 753985
Implantable Lead Connection Status: 753985
Implantable Lead Implant Date: 20181211
Implantable Lead Implant Date: 20181211
Implantable Lead Location: 753859
Implantable Lead Location: 753860
Implantable Lead Model: 3830
Implantable Lead Model: 5076
Implantable Pulse Generator Implant Date: 20181211
Lead Channel Impedance Value: 285 Ohm
Lead Channel Impedance Value: 323 Ohm
Lead Channel Impedance Value: 380 Ohm
Lead Channel Impedance Value: 380 Ohm
Lead Channel Pacing Threshold Amplitude: 0.625 V
Lead Channel Pacing Threshold Amplitude: 1.25 V
Lead Channel Pacing Threshold Pulse Width: 0.4 ms
Lead Channel Pacing Threshold Pulse Width: 0.4 ms
Lead Channel Sensing Intrinsic Amplitude: 3.625 mV
Lead Channel Sensing Intrinsic Amplitude: 3.625 mV
Lead Channel Sensing Intrinsic Amplitude: 5.375 mV
Lead Channel Sensing Intrinsic Amplitude: 5.375 mV
Lead Channel Setting Pacing Amplitude: 2 V
Lead Channel Setting Pacing Amplitude: 2.5 V
Lead Channel Setting Pacing Pulse Width: 0.7 ms
Lead Channel Setting Sensing Sensitivity: 1.2 mV
Zone Setting Status: 755011
Zone Setting Status: 755011

## 2024-03-06 ENCOUNTER — Encounter: Payer: Self-pay | Admitting: Pulmonary Disease

## 2024-03-06 ENCOUNTER — Other Ambulatory Visit (HOSPITAL_COMMUNITY): Payer: Self-pay

## 2024-03-06 ENCOUNTER — Ambulatory Visit (INDEPENDENT_AMBULATORY_CARE_PROVIDER_SITE_OTHER): Payer: Medicare HMO | Admitting: Pulmonary Disease

## 2024-03-06 VITALS — BP 138/60 | HR 92 | Temp 98.0°F | Ht 71.0 in | Wt 246.4 lb

## 2024-03-06 DIAGNOSIS — J449 Chronic obstructive pulmonary disease, unspecified: Secondary | ICD-10-CM

## 2024-03-06 DIAGNOSIS — I4891 Unspecified atrial fibrillation: Secondary | ICD-10-CM

## 2024-03-06 DIAGNOSIS — J849 Interstitial pulmonary disease, unspecified: Secondary | ICD-10-CM

## 2024-03-06 DIAGNOSIS — R0600 Dyspnea, unspecified: Secondary | ICD-10-CM | POA: Diagnosis not present

## 2024-03-06 DIAGNOSIS — Z87891 Personal history of nicotine dependence: Secondary | ICD-10-CM

## 2024-03-06 MED ORDER — BUDESONIDE-FORMOTEROL FUMARATE 160-4.5 MCG/ACT IN AERO
2.0000 | INHALATION_SPRAY | Freq: Two times a day (BID) | RESPIRATORY_TRACT | 12 refills | Status: AC
  Filled 2024-03-06: qty 10.2, 25d supply, fill #0
  Filled 2024-04-19: qty 10.2, 25d supply, fill #1
  Filled 2024-05-14: qty 10.2, 25d supply, fill #2
  Filled 2024-07-12: qty 10.2, 25d supply, fill #3
  Filled 2024-08-07: qty 10.2, 25d supply, fill #4
  Filled 2024-09-07: qty 10.2, 25d supply, fill #5
  Filled 2024-10-30: qty 10.2, 25d supply, fill #6
  Filled 2024-12-26 – 2024-12-30 (×2): qty 10.2, 25d supply, fill #7

## 2024-03-06 NOTE — Patient Instructions (Signed)
 VISIT SUMMARY:  Today, we discussed your breathing difficulties and reviewed your history of interstitial lung disease, COPD, and atrial fibrillation. We have made some adjustments to your treatment plan to help manage your symptoms more effectively.  YOUR PLAN:  -INTERSTITIAL LUNG DISEASE: Interstitial lung disease is a condition that causes scarring of the lungs, making it difficult to breathe. Your recent CT scan shows slight changes that may indicate mild inflammation. We have prescribed a Symbicort inhaler to help with wheezing and potential airway inflammation. We will also schedule a follow-up CT scan and a pulmonary function test in 6 months to monitor your lung health. Additionally, we will communicate with Dr. Shirlee Latch regarding the reduction of your amiodarone dose and its potential impact on your lung health.  -CHRONIC OBSTRUCTIVE PULMONARY DISEASE (COPD): COPD is a chronic lung disease that causes breathing difficulties. Since you quit smoking in 2018, you have experienced persistent cough and wheezing. We have prescribed a Symbicort inhaler to help manage these symptoms and reduce inflammation. We will monitor your response to this medication and adjust your treatment as necessary.  -ATRIAL FIBRILLATION: Atrial fibrillation is an irregular and often rapid heart rate that can increase the risk of strokes and other heart-related complications. You are currently managing this condition with amiodarone and Coumadin. We have recently reduced your amiodarone dose to 100 mg to mitigate side effects, including potential lung changes. We will continue to coordinate with Dr. Shirlee Latch regarding the further management of your amiodarone therapy.  INSTRUCTIONS:  Please follow up with a CT scan and a pulmonary function test in 6 months to monitor your lung health. Continue taking your medications as prescribed, including the new Symbicort inhaler. We will also coordinate with Dr. Shirlee Latch regarding your  amiodarone therapy. If you experience any new or worsening symptoms, please contact our office.

## 2024-03-06 NOTE — Progress Notes (Signed)
 Brandon [name]    [id number]    1946/02/26  Primary Care Physician:Pa, [facility]  Referring Physician: [facility] [address] [place],  [place] [address]  Chief complaint:   Follow-up for abnormal CT, concern for ILD  HPI: 78 y.o. ex-smoker with significant cardiac history of  CVA, paroxysmal atrial fibrillation, valvular heart disease, and chronic diastolic CHF.  He had a prolonged hospitalization in 2018 for heart failure, A. fib with RVR.  Eventually underwent aortic and mitral valve replacement, Maze procedure, LAA clipping  He was started on amiodarone around 2018 2019 and continues on at 800 mg a day.  Follows with Dr. Shirlee Latch in the heart failure clinic  Over the past few months he has noted increasing dyspnea on exertion.  He had a work-up done including PFTs and CT scan which showed changes suggestive of interstitial lung disease and has been referred here for further evaluation.  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  He did get a sed rate which shows slightly elevated at 46  Amiodarone was temporarily held however restarted in June 2022 as he is back in atrial fibrillation He continues to have issues with recurrent atrial fibrillation and underwent ablation on 12/16/2021.  He was attempted to be taken off amiodarone in 2023 but quickly went back into atrial fibrillation and hence the medication was restarted.  He had an  ILD panel which showed marked elevation of CCP.  Evaluated by rheumatology in early May 2022 with no findings of rheumatoid arthritis.  Elevated CCP was felt to be incidental  He was tried on Trelegy inhaler but was stopped after it made his breathing worse with increasing coughing  Pets: 2 cats Occupation: Retired Curator at [employer]. Exposures: Exposure to chemicals while working.  No ongoing exposures.  No mold, hot tub, Jacuzzi.  No feather pillows or comforter Smoking  history: 30-pack-year smoker.  Quit in 2018 Travel history: No significant travel history Relevant family history: No family history of lung disease  Interim history: Discussed the use of AI scribe software for clinical note transcription with the patient, who gave verbal consent to proceed.  History of Present Illness Brandon [name] "Brandon [name]" is a 78 year old male with interstitial lung disease who presents with breathing difficulties.  He experiences shortness of breath, particularly when engaging in more walking than usual, and notes wheezing in the morning, describing it as 'trying to clear his air passage holes.' No current use of inhalers and no significant cold symptoms, though he often feels cold.  He has a history of interstitial lung disease, initially identified in 2022 via a CT scan, which was mild and thought to be related to amiodarone use for atrial fibrillation. Despite attempts to discontinue amiodarone, he resumed it due to his atrial fibrillation. Recent CT scans show very slight changes, possibly indicating increased inflammation.  His medication regimen includes amiodarone, recently reduced to 100 mg due to side effects, and Lasix every other day. He previously tried Trelegy for his breathing issues, but it caused significant coughing. Prednisone was effective in the past but interfered with his heart rate and Coumadin therapy.  He quit smoking on July 13, 2017, following a pulmonary and cardiac failure event. He has a persistent 'smoker's cough' and a constantly runny nose.  He has a history of atrial fibrillation and is managed with amiodarone, which has been recently halved due to side effects.  He is also on Coumadin.  He has undergone cataract surgery, which improved his vision significantly. He uses hearing aids but notes they do not provide 'twenty twenty hearing.'    Outpatient Encounter Medications as of 03/06/2024  Medication Sig   Accu-Chek Softclix Lancets  lancets Use as directed to check blood sugar daily and as needed   acetaminophen (TYLENOL) 500 MG tablet Take 1,000 mg by mouth every 6 (six) hours as needed (pain.).   amiodarone (PACERONE) 200 MG tablet Take 0.5 tablets (100 mg total) by mouth daily.   Blood Glucose Monitoring Suppl (ACCU-CHEK GUIDE) w/Device KIT check your sugar once daily and as needed as directed   calcium carbonate (TUMS - DOSED IN MG ELEMENTAL CALCIUM) 500 MG chewable tablet Chew 1 tablet by mouth daily as needed for indigestion or heartburn.   Cholecalciferol (VITAMIN D3) 50 MCG (2000 UT) capsule Take 2,000 Units by mouth daily.   citalopram (CELEXA) 20 MG tablet Take 1 tablet (20 mg total) by mouth at bedtime.   docusate sodium (COLACE) 100 MG capsule Take 1 capsule (100 mg total) by mouth 3 (three) times daily as needed.   empagliflozin (JARDIANCE) 10 MG TABS tablet TAKE 1 TABLET BY MOUTH ONCE A DAY BEFORE BREAKFAST   furosemide (LASIX) 20 MG tablet Take 1 tablet (20 mg total) by mouth every other day.   glucose blood (ACCU-CHEK GUIDE) test strip Use as directed to check blood sugar once daily and as needed   loratadine (CLARITIN) 10 MG tablet Take 10 mg by mouth daily as needed for allergies or rhinitis.   methocarbamol (ROBAXIN) 500 MG tablet Take 1 tablet (500 mg total) by mouth every 8 (eight) hours as needed for muscle spasms.   pantoprazole (PROTONIX) 40 MG tablet Take 1 tablet (40 mg total) by mouth daily.   Pseudoeph-Doxylamine-DM-APAP (NYQUIL PO) Take 30 mLs by mouth daily as needed (Cold Symptons).   rosuvastatin (CRESTOR) 10 MG tablet TAKE 1 TABLET EVERY DAY   sacubitril-valsartan (ENTRESTO) 24-26 MG Take 1 tablet by mouth 2 (two) times daily.   spironolactone (ALDACTONE) 25 MG tablet TAKE 1 TABLET EVERY DAY   traZODone (DESYREL) 50 MG tablet Take 100 mg by mouth at bedtime.   triamcinolone ointment (KENALOG) 0.1 % Apply 1 Application topically 2 (two) times daily for 21 days.   warfarin (COUMADIN) 2.5 MG  tablet TAKE 1 TO 1 AND 1/2 TABLETS DAILY OR AS DIRECTED BY THE COUMADIN CLINIC (Patient taking differently: TAKE1 tablet on Monday Wednesday Thursday Friday and Saturday 1 and1/2 TABLETS on Sundays and Tuesday  OR AS DIRECTED BY THE COUMADIN CLINIC)   No facility-administered encounter medications on file as of 03/06/2024.   Physical Exam: Blood pressure 138/60, pulse 92, temperature 98 F (36.7 C), temperature source Oral, height 5\' 11"  (1.803 m), weight 246 lb 6.4 oz (111.8 kg), SpO2 95%. Gen:      No acute distress HEENT:  EOMI, sclera anicteric Neck:     No masses; no thyromegaly Lungs:    Clear to auscultation bilaterally; normal respiratory effort CV:         Regular rate and rhythm; no murmurs Abd:      + bowel sounds; soft, non-tender; no palpable masses, no distension Ext:    No edema; adequate peripheral perfusion Skin:      Warm and dry; no rash Neuro: alert and oriented x 3 Psych: normal mood and affect   Data Reviewed: Imaging: CT coronary 09/28/17-no interstitial lung disease, mild bibasal atelectasis  High-res CT 01/20/2021-bilateral groundglass attenuation most evident in the mid to lower lungs with mild reticulation, moderate air trapping.   High-res CT 05/08/2021-minimal nonspecific peripheral interstitial opacity, air trapping, bronchial wall thickening.  High-res CT 12/27/2022-moderate patchy air trapping, minimal nonspecific interstitial opacities and reticulation.  High-res CT 02/13/2024-slight increase in reticulation, groundglass opacities, alternate pattern, trace right effusion, mild adenopathy, dilated PA I have reviewed the images personally  PFTs: 01/07/2021 FVC 2.70 (60%), FEV1 2.19 (16%), F/F 81, TLC 6.03 [82%], DLCO 13.20 [50%] Moderate diffusion defect  06/10/2021 FVC 2.95 [6%], FEV1 2.33 [73%], F/F 79, TLC 6.92 [95%], DLCO 17.20 (66%] Mild restriction, diffusion defect.  Improved compared to January 2022  08/15/2023 FVC 4.32 [102%], FEV1 3.54 [119%],  F/F82, TLC 7.03 [94%], DLCO 28.35 [111%] Mild restriction  08/11/2023 FVC 3.13 [92%], FEV1 2.19 [70%], F/F70, TLC 5.59 [77%], DLCO 21.52 [4%] Mild restriction  Labs: Sed rate 01/29/21-46  CTD serologies 02/24/2021-positive for CCP greater than 250 Assessment & Plan Interstitial Lung Disease Mild interstitial lung disease on CT scan from 2022, possibly related to amiodarone use. Recent CT shows slight nonspecific changes, possibly indicating mild inflammation. Smoking may contribute to respiratory symptoms, including wheezing and a congested cough, particularly in the morning. Amiodarone dose recently halved due to side effects, which may help reduce lung changes. Symbicort inhaler considered to address wheezing and potential airway inflammation, with minimal systemic effects due to its mild steroid content.  - Order follow-up CT scan in 6 months to monitor lung changes - Order pulmonary function test in 6 months - Prescribe Symbicort inhaler to address wheezing and potential airway inflammation - Communicate with Dr. Shirlee Latch  Ex-smoker Smoking cessation in 2018. Symptoms include persistent cough and wheezing. Previous trial of Trelegy was not tolerated due to coughing. Symbicort is considered a suitable alternative due to its mild steroid content and different delivery method, with its spray form potentially better tolerated than Trelegy's powder form.  - Prescribe Symbicort inhaler to manage symptoms and reduce inflammation - Monitor response to Symbicort and adjust treatment as necessary  Atrial Fibrillation Atrial fibrillation managed with amiodarone, recently reduced to 100 mg due to side effects. Also on Coumadin. Reduction in amiodarone aims to mitigate side effects, including potential lung changes. Coordination with Dr. Shirlee Latch to assess further management of amiodarone is planned.  - Continue current dose of amiodarone at 100 mg - Continue Coumadin therapy - Coordinate with Dr.  Shirlee Latch regarding further management of amiodarone   Plan/Recommendations: - Start Symbicort inhaler - High-res CT, PFTs in 6 months  Chilton Greathouse MD [facility] 03/06/2024, 10:43 AM  CC: Pa, [facility]

## 2024-03-11 ENCOUNTER — Ambulatory Visit: Payer: Medicare HMO | Admitting: Podiatry

## 2024-03-11 ENCOUNTER — Encounter: Payer: Self-pay | Admitting: Podiatry

## 2024-03-11 DIAGNOSIS — B351 Tinea unguium: Secondary | ICD-10-CM

## 2024-03-11 DIAGNOSIS — Z7901 Long term (current) use of anticoagulants: Secondary | ICD-10-CM | POA: Diagnosis not present

## 2024-03-11 DIAGNOSIS — M79675 Pain in left toe(s): Secondary | ICD-10-CM | POA: Diagnosis not present

## 2024-03-11 DIAGNOSIS — M79674 Pain in right toe(s): Secondary | ICD-10-CM | POA: Diagnosis not present

## 2024-03-11 NOTE — Progress Notes (Signed)
This patient returns to my office for at risk foot care.  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  This patient is unable to cut nails himself since the patient cannot reach his nails.These nails are painful walking and wearing shoes.  This patient presents for at risk foot care today.  General Appearance  Alert, conversant and in no acute stress.  Vascular  Dorsalis pedis and posterior tibial  pulses are palpable  bilaterally.  Capillary return is within normal limits  bilaterally. Temperature is within normal limits  bilaterally.  Neurologic  Senn-Weinstein monofilament wire test within normal limits  bilaterally. Muscle power within normal limits bilaterally.  Nails Thick disfigured discolored nails with subungual debris  from hallux to fifth toes bilaterally. No evidence of bacterial infection or drainage bilaterally.  Orthopedic  No limitations of motion  feet .  No crepitus or effusions noted.  No bony pathology or digital deformities noted.  Skin  normotropic skin with no porokeratosis noted bilaterally.  No signs of infections or ulcers noted.   Asymptomatic heel callus.  Onychomycosis  Pain in right toes  Pain in left toes  Consent was obtained for treatment procedures.   Mechanical debridement of nails 1-5  bilaterally performed with a nail nipper.  Filed with dremel without incident.    Return office visit    3 months                  Told patient to return for periodic foot care and evaluation due to potential at risk complications.   Helane Gunther DPM

## 2024-03-15 ENCOUNTER — Ambulatory Visit: Payer: Medicare HMO | Attending: Internal Medicine

## 2024-03-15 DIAGNOSIS — Z5181 Encounter for therapeutic drug level monitoring: Secondary | ICD-10-CM | POA: Diagnosis not present

## 2024-03-15 DIAGNOSIS — Z953 Presence of xenogenic heart valve: Secondary | ICD-10-CM

## 2024-03-15 LAB — POCT INR: INR: 3.5 — AB (ref 2.0–3.0)

## 2024-03-15 NOTE — Patient Instructions (Signed)
 Description   HOLD today's dose and then continue taking warfarin 1 tablet daily except 1.5 tablets on Sundays and Wednesdays.  Stay consistent with greens (2-3 times per week).  Recheck INR in 5 weeks.  Coumadin Clinic [phone]. HAVE DENTAL OFFICE FAX CLEARANCE FORM TO CARDIOLOGY OFFICE.

## 2024-03-16 ENCOUNTER — Other Ambulatory Visit (HOSPITAL_COMMUNITY): Payer: Self-pay | Admitting: Cardiology

## 2024-03-16 ENCOUNTER — Other Ambulatory Visit (HOSPITAL_COMMUNITY): Payer: Self-pay

## 2024-03-18 ENCOUNTER — Other Ambulatory Visit: Payer: Self-pay

## 2024-03-18 ENCOUNTER — Other Ambulatory Visit (HOSPITAL_COMMUNITY): Payer: Self-pay

## 2024-03-18 MED ORDER — ENTRESTO 24-26 MG PO TABS
1.0000 | ORAL_TABLET | Freq: Two times a day (BID) | ORAL | 2 refills | Status: DC
  Filled 2024-03-18: qty 180, 90d supply, fill #0
  Filled 2024-06-15: qty 180, 90d supply, fill #1
  Filled 2024-09-11: qty 180, 90d supply, fill #2

## 2024-03-20 ENCOUNTER — Other Ambulatory Visit (HOSPITAL_COMMUNITY): Payer: Self-pay

## 2024-03-20 DIAGNOSIS — E559 Vitamin D deficiency, unspecified: Secondary | ICD-10-CM | POA: Diagnosis not present

## 2024-03-20 DIAGNOSIS — E1122 Type 2 diabetes mellitus with diabetic chronic kidney disease: Secondary | ICD-10-CM | POA: Diagnosis not present

## 2024-03-20 DIAGNOSIS — R946 Abnormal results of thyroid function studies: Secondary | ICD-10-CM | POA: Diagnosis not present

## 2024-03-20 DIAGNOSIS — N184 Chronic kidney disease, stage 4 (severe): Secondary | ICD-10-CM | POA: Diagnosis not present

## 2024-03-28 ENCOUNTER — Ambulatory Visit: Payer: Medicare HMO | Attending: Cardiology | Admitting: Pharmacist

## 2024-03-28 ENCOUNTER — Telehealth: Payer: Self-pay | Admitting: Pharmacist

## 2024-03-28 ENCOUNTER — Telehealth: Payer: Self-pay | Admitting: Pharmacy Technician

## 2024-03-28 ENCOUNTER — Other Ambulatory Visit (HOSPITAL_COMMUNITY): Payer: Self-pay

## 2024-03-28 ENCOUNTER — Encounter (HOSPITAL_COMMUNITY): Payer: Self-pay

## 2024-03-28 ENCOUNTER — Encounter: Payer: Self-pay | Admitting: Pharmacist

## 2024-03-28 DIAGNOSIS — I2510 Atherosclerotic heart disease of native coronary artery without angina pectoris: Secondary | ICD-10-CM | POA: Diagnosis not present

## 2024-03-28 DIAGNOSIS — N182 Chronic kidney disease, stage 2 (mild): Secondary | ICD-10-CM

## 2024-03-28 DIAGNOSIS — E1122 Type 2 diabetes mellitus with diabetic chronic kidney disease: Secondary | ICD-10-CM

## 2024-03-28 MED ORDER — MOUNJARO 2.5 MG/0.5ML ~~LOC~~ SOAJ
2.5000 mg | SUBCUTANEOUS | 0 refills | Status: DC
Start: 2024-03-28 — End: 2024-04-19
  Filled 2024-03-28: qty 2, 28d supply, fill #0

## 2024-03-28 NOTE — Telephone Encounter (Signed)
 Pharmacy Patient Advocate Encounter  Received notification from [employer] that Prior Authorization for mounjaro has been APPROVED from 03/28/24 to 12/11/24. Ran test claim, Copay is $47.00. This test claim was processed through [facility]- copay amounts may vary at other pharmacies due to pharmacy/plan contracts, or as the patient moves through the different stages of their insurance plan.

## 2024-03-28 NOTE — Telephone Encounter (Signed)
 Please complete PA for [facility]

## 2024-03-28 NOTE — Patient Instructions (Addendum)
 It was good seeing you again  The medications we spoke about today are called Mounjaro and Ozempic  Both are injections you take once a week  I will complete the prior authorizations for you and let you know the price  Please let us  know if you have any questions  Joelene Murrain, PharmD, BCACP, CDCES, CPP [address] [place], [place], [address] Phone: [phone], Fax: [phone]

## 2024-03-28 NOTE — Progress Notes (Signed)
 Patient ID: Brandon [name]                 DOB: December 02, 1946                    MRN: [id number]     HPI: Brandon [name] is a 78 y.o. male patient referred to pharmacy clinic by Dr Mitzie Anda to initiate GLP1-RA therapy. PMH is significant for T2DM, CKD, A Fib, CAD, CHF, CVA , and obesity. Most recent BMI 34.38.  Patient presents today to discuss GLP1a. Previously seen in 2023 for same issue. Patient was approved previously for semaglutide but decided not to start.   Has difficulty being physically active. Walks very slowly and requires use of a cane. Has trouble hearing.  A1c 6.6%  Labs: Lab Results  Component Value Date   HGBA1C 6.6 (H) 04/19/2022    Wt Readings from Last 1 Encounters:  03/06/24 246 lb 6.4 oz (111.8 kg)    BP Readings from Last 1 Encounters:  03/06/24 138/60   Pulse Readings from Last 1 Encounters:  03/06/24 92       Component Value Date/Time   CHOL 115 02/05/2024 1213   CHOL 120 12/27/2012 0912   TRIG 89 02/05/2024 1213   TRIG 115 12/27/2012 0912   HDL 53 02/05/2024 1213   HDL 40 12/27/2012 0912   CHOLHDL 2.2 02/05/2024 1213   VLDL 18 02/05/2024 1213   LDLCALC 44 02/05/2024 1213   LDLCALC 57 12/27/2012 0912    Past Medical History:  Diagnosis Date   Anemia 07/13/2017   Anxiety    Aortic insufficiency    Aortic stenosis, moderate 07/13/2017   Arthritis    back    Carotid stenosis    Right carotid stent (widely patent) 40 - 59% left plaque 11/13   CHF (congestive heart failure) (HCC)    Chronic kidney disease    COPD (chronic obstructive pulmonary disease) (HCC)    Coronary artery disease involving native coronary artery of native heart without angina pectoris    Depression    Diabetes (HCC)    Dyslipidemia    GERD (gastroesophageal reflux disease)    Heart murmur    Hemiplegia affecting unspecified side, late effect of cerebrovascular disease    resolved- from L side    Hypertension    Jaundice    resolved following ERCP &  Cholecystectomy   Mild emphysema (HCC)    Mitral regurgitation    Mitral valve insufficiency and aortic valve insufficiency    Myocardial infarction (HCC)    Paroxysmal atrial fibrillation (HCC)    Pre-diabetes    per spouse   S/P aortic valve replacement with bioprosthetic valve 11/16/2017   23 mm [facility] Magna Ease stented bovine pericardial tissue valve   S/P CABG x 1 11/16/2017   SVG to PDA with [facility] via right thigh   S/P Maze operation for atrial fibrillation 11/16/2017   Left side lesion set using bipolar radiofrequency and cryothermy ablation with clipping of LA appendage   S/P mitral valve replacement with bioprosthetic valve 11/16/2017   27 mm [facility] Mitral stented bovine pericardial tissue valve   Sleep apnea    does not wear CPAP   Sleep concern    resulted in surgery- after + sleep test. Pt. doesn't have a problem any longer.    Stroke (HCC) 03/11/2003   stent placed on the 31, 3, 2004, L side    Wears glasses    Wears hearing aid in  both ears    Wears partial dentures     Current Outpatient Medications on File Prior to Visit  Medication Sig Dispense Refill   Accu-Chek Softclix Lancets lancets Use as directed to check blood sugar daily and as needed 100 each 3   acetaminophen (TYLENOL) 500 MG tablet Take 1,000 mg by mouth every 6 (six) hours as needed (pain.).     amiodarone (PACERONE) 200 MG tablet Take 0.5 tablets (100 mg total) by mouth daily. 45 tablet 3   Blood Glucose Monitoring Suppl (ACCU-CHEK GUIDE) w/Device KIT check your sugar once daily and as needed as directed 1 kit 0   budesonide-formoterol (SYMBICORT) 160-4.5 MCG/ACT inhaler Inhale 2 puffs into the lungs 2 times daily at 12 noon and 4 pm. 10.2 g 12   calcium carbonate (TUMS - DOSED IN MG ELEMENTAL CALCIUM) 500 MG chewable tablet Chew 1 tablet by mouth daily as needed for indigestion or heartburn.     Cholecalciferol (VITAMIN D3) 50 MCG (2000 UT) capsule Take 2,000 Units by mouth daily.      citalopram (CELEXA) 20 MG tablet Take 1 tablet (20 mg total) by mouth at bedtime. 30 tablet 5   docusate sodium (COLACE) 100 MG capsule Take 1 capsule (100 mg total) by mouth 3 (three) times daily as needed. 20 capsule 0   empagliflozin (JARDIANCE) 10 MG TABS tablet TAKE 1 TABLET BY MOUTH ONCE A DAY BEFORE BREAKFAST 90 tablet 3   furosemide (LASIX) 20 MG tablet Take 1 tablet (20 mg total) by mouth every other day. 45 tablet 3   glucose blood (ACCU-CHEK GUIDE) test strip Use as directed to check blood sugar once daily and as needed 100 strip 3   loratadine (CLARITIN) 10 MG tablet Take 10 mg by mouth daily as needed for allergies or rhinitis.     methocarbamol (ROBAXIN) 500 MG tablet Take 1 tablet (500 mg total) by mouth every 8 (eight) hours as needed for muscle spasms. 30 tablet 0   pantoprazole (PROTONIX) 40 MG tablet Take 1 tablet (40 mg total) by mouth daily. 90 tablet 3   Pseudoeph-Doxylamine-DM-APAP (NYQUIL PO) Take 30 mLs by mouth daily as needed (Cold Symptons).     rosuvastatin (CRESTOR) 10 MG tablet TAKE 1 TABLET EVERY DAY 90 tablet 3   sacubitril-valsartan (ENTRESTO) 24-26 MG Take 1 tablet by mouth 2 (two) times daily. 180 tablet 2   spironolactone (ALDACTONE) 25 MG tablet TAKE 1 TABLET EVERY DAY 90 tablet 3   traZODone (DESYREL) 50 MG tablet Take 100 mg by mouth at bedtime.     triamcinolone ointment (KENALOG) 0.1 % Apply 1 Application topically 2 (two) times daily for 21 days. 60 g 0   warfarin (COUMADIN) 2.5 MG tablet TAKE 1 TO 1 AND 1/2 TABLETS DAILY OR AS DIRECTED BY THE COUMADIN CLINIC (Patient taking differently: TAKE1 tablet on Monday Wednesday Thursday Friday and Saturday 1 and1/2 TABLETS on Sundays and Tuesday  OR AS DIRECTED BY THE COUMADIN CLINIC) 115 tablet 3   No current facility-administered medications on file prior to visit.    Allergies  Allergen Reactions   Amlodipine Swelling   Bee Venom Anaphylaxis and Swelling   Bacitracin-Polymyxin B Other (See Comments)     Redness Other reaction(s): rash     Assessment/Plan:  1. T2DM - Patient's most recent A1c 6.6% and BMI 34.38. Would benefit from GLP1 for blood sugar control, weight loss, and to reduce risk of major cardiac events.  Using demo pens, educated atoient on mechanism of  action, storage, site selections, administration, and possible adverse effects. Confirmed patient has no personal or family history of medullary thyroid carcinoma (MTC) or Multiple Endocrine Neoplasia syndrome type 2 (MEN 2). Injection technique reviewed at today's visit.  Advised patient on common side effects including nausea, diarrhea, dyspepsia, decreased appetite, and fatigue. Counseled patient on reducing meal size and how to titrate medication to minimize side effects. Counseled patient to call if intolerable side effects or if experiencing dehydration, abdominal pain, or dizziness. Patient will adhere to dietary modifications and will target at least 150 minutes of moderate intensity exercise weekly.   Chris Kamalani Mastro, PharmD, BCACP, CDCES, CPP [address] [place], [place], [address] Phone: [phone], Fax: [phone]

## 2024-03-30 ENCOUNTER — Other Ambulatory Visit (HOSPITAL_COMMUNITY): Payer: Self-pay

## 2024-04-10 NOTE — Addendum Note (Signed)
 Addended by: Lott Rouleau A on: 04/10/2024 10:52 AM   Modules accepted: Orders

## 2024-04-10 NOTE — Progress Notes (Signed)
 Remote pacemaker transmission.

## 2024-04-19 ENCOUNTER — Ambulatory Visit: Attending: Cardiology | Admitting: *Deleted

## 2024-04-19 ENCOUNTER — Other Ambulatory Visit (HOSPITAL_COMMUNITY): Payer: Self-pay

## 2024-04-19 ENCOUNTER — Other Ambulatory Visit: Payer: Self-pay | Admitting: Pharmacist

## 2024-04-19 DIAGNOSIS — N182 Chronic kidney disease, stage 2 (mild): Secondary | ICD-10-CM

## 2024-04-19 DIAGNOSIS — I480 Paroxysmal atrial fibrillation: Secondary | ICD-10-CM | POA: Diagnosis not present

## 2024-04-19 DIAGNOSIS — Z5181 Encounter for therapeutic drug level monitoring: Secondary | ICD-10-CM

## 2024-04-19 DIAGNOSIS — Z8679 Personal history of other diseases of the circulatory system: Secondary | ICD-10-CM | POA: Diagnosis not present

## 2024-04-19 DIAGNOSIS — Z9889 Other specified postprocedural states: Secondary | ICD-10-CM

## 2024-04-19 DIAGNOSIS — Z953 Presence of xenogenic heart valve: Secondary | ICD-10-CM

## 2024-04-19 DIAGNOSIS — Z951 Presence of aortocoronary bypass graft: Secondary | ICD-10-CM

## 2024-04-19 LAB — POCT INR: INR: 3.8 — AB (ref 2.0–3.0)

## 2024-04-19 MED ORDER — MOUNJARO 5 MG/0.5ML ~~LOC~~ SOAJ
5.0000 mg | SUBCUTANEOUS | 0 refills | Status: DC
Start: 2024-04-19 — End: 2024-05-14
  Filled 2024-04-19: qty 2, 28d supply, fill #0

## 2024-04-19 NOTE — Patient Instructions (Addendum)
 Description   HOLD today's dose of warfarin and then START taking warfarin 1 tablet daily except 1.5 tablets on Wednesdays.  Stay consistent with greens (2-3 times per week).  Recheck INR in 3 weeks.  Coumadin  Clinic [phone]. HAVE DENTAL OFFICE FAX CLEARANCE FORM TO CARDIOLOGY OFFICE.

## 2024-04-23 ENCOUNTER — Other Ambulatory Visit (HOSPITAL_COMMUNITY): Payer: Self-pay

## 2024-05-07 DIAGNOSIS — N189 Chronic kidney disease, unspecified: Secondary | ICD-10-CM | POA: Diagnosis not present

## 2024-05-07 DIAGNOSIS — N183 Chronic kidney disease, stage 3 unspecified: Secondary | ICD-10-CM | POA: Diagnosis not present

## 2024-05-07 DIAGNOSIS — R7303 Prediabetes: Secondary | ICD-10-CM | POA: Diagnosis not present

## 2024-05-07 DIAGNOSIS — I129 Hypertensive chronic kidney disease with stage 1 through stage 4 chronic kidney disease, or unspecified chronic kidney disease: Secondary | ICD-10-CM | POA: Diagnosis not present

## 2024-05-07 DIAGNOSIS — N2581 Secondary hyperparathyroidism of renal origin: Secondary | ICD-10-CM | POA: Diagnosis not present

## 2024-05-07 DIAGNOSIS — I480 Paroxysmal atrial fibrillation: Secondary | ICD-10-CM | POA: Diagnosis not present

## 2024-05-07 DIAGNOSIS — R339 Retention of urine, unspecified: Secondary | ICD-10-CM | POA: Diagnosis not present

## 2024-05-07 DIAGNOSIS — D631 Anemia in chronic kidney disease: Secondary | ICD-10-CM | POA: Diagnosis not present

## 2024-05-10 ENCOUNTER — Ambulatory Visit: Attending: Internal Medicine

## 2024-05-10 DIAGNOSIS — Z953 Presence of xenogenic heart valve: Secondary | ICD-10-CM | POA: Diagnosis not present

## 2024-05-10 DIAGNOSIS — Z8679 Personal history of other diseases of the circulatory system: Secondary | ICD-10-CM

## 2024-05-10 DIAGNOSIS — Z951 Presence of aortocoronary bypass graft: Secondary | ICD-10-CM | POA: Diagnosis not present

## 2024-05-10 DIAGNOSIS — I480 Paroxysmal atrial fibrillation: Secondary | ICD-10-CM

## 2024-05-10 DIAGNOSIS — Z5181 Encounter for therapeutic drug level monitoring: Secondary | ICD-10-CM | POA: Diagnosis not present

## 2024-05-10 LAB — POCT INR: INR: 5.3 — AB (ref 2.0–3.0)

## 2024-05-10 NOTE — Patient Instructions (Signed)
 HOLD today and Saturday then Decrease 1 tablet daily except 0.5 tablet on Wednesdays.  Stay consistent with greens (2-3 times per week).  Recheck INR in 2 weeks.  Coumadin  Clinic [phone]. HAVE DENTAL OFFICE FAX CLEARANCE FORM TO CARDIOLOGY OFFICE.

## 2024-05-13 ENCOUNTER — Telehealth: Payer: Self-pay

## 2024-05-13 NOTE — Telephone Encounter (Signed)
 I spoke to patient's spouse and informed her that the Amoxicillin  pre dental visit would not affect INR value.  She thanked me for the call and verbalized understanding.

## 2024-05-14 ENCOUNTER — Other Ambulatory Visit (HOSPITAL_COMMUNITY): Payer: Self-pay

## 2024-05-14 ENCOUNTER — Encounter: Payer: Self-pay | Admitting: Pharmacist

## 2024-05-14 DIAGNOSIS — I4819 Other persistent atrial fibrillation: Secondary | ICD-10-CM

## 2024-05-14 MED ORDER — MOUNJARO 7.5 MG/0.5ML ~~LOC~~ SOAJ
7.5000 mg | SUBCUTANEOUS | 0 refills | Status: DC
  Filled 2024-05-14: qty 2, 28d supply, fill #0

## 2024-05-15 ENCOUNTER — Other Ambulatory Visit (HOSPITAL_COMMUNITY): Payer: Self-pay

## 2024-05-21 ENCOUNTER — Ambulatory Visit: Attending: Internal Medicine

## 2024-05-21 DIAGNOSIS — Z5181 Encounter for therapeutic drug level monitoring: Secondary | ICD-10-CM

## 2024-05-21 DIAGNOSIS — Z951 Presence of aortocoronary bypass graft: Secondary | ICD-10-CM

## 2024-05-21 DIAGNOSIS — Z953 Presence of xenogenic heart valve: Secondary | ICD-10-CM | POA: Diagnosis not present

## 2024-05-21 DIAGNOSIS — I480 Paroxysmal atrial fibrillation: Secondary | ICD-10-CM

## 2024-05-21 DIAGNOSIS — Z8679 Personal history of other diseases of the circulatory system: Secondary | ICD-10-CM

## 2024-05-21 LAB — POCT INR: INR: 2.8 (ref 2.0–3.0)

## 2024-05-21 NOTE — Patient Instructions (Signed)
 Continue 1 tablet daily except 0.5 tablet on Wednesdays.  Stay consistent with greens (2-3 times per week).  Recheck INR in 4 weeks.  Coumadin  Clinic [phone]. HAVE DENTAL OFFICE FAX CLEARANCE FORM TO CARDIOLOGY OFFICE.

## 2024-05-30 ENCOUNTER — Ambulatory Visit (INDEPENDENT_AMBULATORY_CARE_PROVIDER_SITE_OTHER): Payer: Medicare HMO

## 2024-05-30 DIAGNOSIS — I442 Atrioventricular block, complete: Secondary | ICD-10-CM

## 2024-05-30 LAB — CUP PACEART REMOTE DEVICE CHECK
Battery Remaining Longevity: 96 mo
Battery Voltage: 2.99 V
Brady Statistic AP VP Percent: 0.37 %
Brady Statistic AP VS Percent: 35.08 %
Brady Statistic AS VP Percent: 0.12 %
Brady Statistic AS VS Percent: 64.43 %
Brady Statistic RA Percent Paced: 35.48 %
Brady Statistic RV Percent Paced: 0.5 %
Date Time Interrogation Session: 20250618235030
Implantable Lead Connection Status: 753985
Implantable Lead Connection Status: 753985
Implantable Lead Implant Date: 20181211
Implantable Lead Implant Date: 20181211
Implantable Lead Location: 753859
Implantable Lead Location: 753860
Implantable Lead Model: 3830
Implantable Lead Model: 5076
Implantable Pulse Generator Implant Date: 20181211
Lead Channel Impedance Value: 285 Ohm
Lead Channel Impedance Value: 323 Ohm
Lead Channel Impedance Value: 399 Ohm
Lead Channel Impedance Value: 399 Ohm
Lead Channel Pacing Threshold Amplitude: 0.625 V
Lead Channel Pacing Threshold Amplitude: 1.375 V
Lead Channel Pacing Threshold Pulse Width: 0.4 ms
Lead Channel Pacing Threshold Pulse Width: 0.4 ms
Lead Channel Sensing Intrinsic Amplitude: 4.75 mV
Lead Channel Sensing Intrinsic Amplitude: 4.75 mV
Lead Channel Sensing Intrinsic Amplitude: 5.75 mV
Lead Channel Sensing Intrinsic Amplitude: 5.75 mV
Lead Channel Setting Pacing Amplitude: 2 V
Lead Channel Setting Pacing Amplitude: 2.5 V
Lead Channel Setting Pacing Pulse Width: 0.7 ms
Lead Channel Setting Sensing Sensitivity: 1.2 mV
Zone Setting Status: 755011
Zone Setting Status: 755011

## 2024-05-31 ENCOUNTER — Ambulatory Visit: Payer: Self-pay | Admitting: Cardiology

## 2024-06-03 NOTE — Progress Notes (Signed)
 Advanced [facility] Clinic Note   PCP: [facility] Nephrology: Dr. Prescilla Cardiology: Dr. Shlomo HF Cardiology: Dr. Rolan  78 y.o.with history of CVA, paroxysmal atrial fibrillation, valvular heart disease, and chronic diastolic CHF presents for evaluation of CHF and valvular heart disease.    He had had episodes of dyspnea and initial workup led to a TEE in 5/18 showing normal EF with moderate AS and moderate MR.  He continued to have episodic dyspnea and ended up admitted in 8/18 with shortness of breath and chest pressure.  This led to a long, complicated hospitalization.  He was noted to be volume overloaded with acute diastolic CHF and was also noted to have symptomatic runs of atrial fibrillation with RVR.  Amiodarone  was started to control the atrial fibrillation.  He developed respiratory distress => Bipap => intubated.  He became hypotensive, requiring pressors.  He developed AKI.  PNA was noted and he received broad spectrum abx.  He had a prolonged intubation but was eventually extubated.  Given deconditioning from long hospitalization, he went to CIR for a couple of weeks.  LHC in 9/18 showed occluded PDA with left>right collaterals.  TEE in 9/18 showed EF 50%, severe MR (possibly infarct-related with restricted posterior leaflet, and moderate AS + moderate-severe AI (possibly rheumatic).   In 12/18, he had cardiac surgery with bioprosthetic aortic and mitral valves placed.  He had SVG-PDA, Maze, and LA appendage clipping.  Post-op course was complicated by CHB requiring Medtronic dual chamber PPM (His bundle lead placed but captured right bundle so induced LBBB).   Echo 01/02/18 LVEF 30-35%, bioprosthetic MV and AoV function normally, RV mildly dilated with normal systolic function.    He was noted to be in atrial flutter with mild RVR and had TEE-guided DCCV on 04/11/18. TEE showed EF 25-30%, normal bioprosthetic aortic and mitral valves.  Echo in 6/19  showed EF 30-35%, normal bioprosthetic aortic and mitral valves.  Echo 12/19 with EF up to 50-55%, normal bioprosthetic mitral and aortic valves, normal RV.   In 2020, he went back into atrial fibrillation.  Amiodarone  was started, and he returned to NSR.   Echo in 11/21 showed EF 50-55%, mild LVH, mildly dilated RV with normal function, bioprosthetic mitral valve with mean gradient 5 mmHg and no MR, bioprosthetic aortic valve with mean gradient 12 and no AI.  PFTs in 1/22 were restrictive.  High resolution CT chest was done in 2/22, concerning for ILD, usual interstitial pneumonitis.  He saw pulmonary, ESR was mildly elevated.  We decided to try him off amiodarone  given concern for possible amiodarone -induced lung toxicity.  He then developed atrial fibrillation off amiodarone , was quite symptomatic.  He underwent TEE-guided DCCV back to NSR in 7/22 and amiodarone  was restarted.  TEE showed EF 50-55%, mid septal HK, RV normal, bioprosthetic aortic and mitral valves appeared normal.   EP tried decreasing amiodarone  to 100 mg daily.  However, patient then developed runs of atrial tachycardia (had to be paced out).  Amiodarone  was increased back to 200 mg daily.   He had HRCT chest in 1/24, no clear ILD.  Saw Dr Theophilus with pulmonary, though it was ok to continue amiodarone .   Echo 5/24 EF 50-55% with normal RV, PASP 33 mmHg, s/p bioprosthetic MVR (mean gradient 4 mmHg, no MR), s/p bioprosthetic AVR (mean gradient 9, no AI).   Today he returns for HF follow up with his wife. Overall feeling fine. He has occasional dizziness with quick  movements, no falls. He has been using a stationary bike at home for exercise. He walks with a cane, activity limited by knee pain. Denies palpitations, abnormal bleeding, CP, edema, or PND/Orthopnea. Appetite ok. Weight at home 227 pounds. Taking all medications.  Wife has labs from nephrology on 05/07/24, creatinine 2.58.  ECG (personally reviewed): none ordered  today.  MDT PPM interrogation (personally reviewed): no AF, 0.5 hr/day activity, 10.8% AP  Labs (1/24): K 4.8, creatinine 2.14 Labs (9/24): K 4.6, creatinine 2.11, LDL 65, A1C 6.0 Labs (2/25): normal TSH, normal LFTs, LDL 44 Labs (3/25): K 4.5, creatinine 2.39 Labs (5/25): creatinine 2.58  PMH: 1. CVA: Right MCA, 2004.  Minimal residual problems.  2. HTN - Peripheral edema with amlodipine .  3. Hyperlipidemia 4. Left rotator cuff surgery 6/18 5. Carotid stenosis: s/p right carotid stent.  6. GERD 7. H/o CCY 8. Anemia: FOBT negative.  9. Gout  10. Atrial fibrillation: Paroxysmal. Maze in 12/18 with LAA clipping.  - Recurrent atrial fibrillation 2020 => amiodarone .  - Recurrent atrial fibrillation off amiodarone  in 7/22, DCCV to NSR and amiodarone  restarted.  11. Valvular heart disease: TEE (5/18) with EF 55-60%, moderate AS mean 33 mmHg, moderate MR, normal RV size and systolic function.  - Echo (8/18): EF 55-60%, moderate LVH, moderate AS with mean gradient 33 mmHg, moderate AI, moderate to severe MR.  - TEE (9/18): EF 50%, mild LV dilation, suspect severe MR with posterior leaflet restricted (?infarct-related MR), ERO 0.4 cm^2, moderate AS/moderate-severe AI (possibly rheumatic).   - In 12/18, he had cardiac surgery with bioprosthetic aortic and mitral valves placed.  He had SVG-PDA, Maze, and LA appendage clipping. - Echo (1/19): LVEF 30-35%, bioprosthetic MV and AoV function normally, RV mildly dilated with normal systolic function.  - TEE (5/19) with EF 25-30%, normal bioprosthetic aortic valve, normal bioprosthetic mitral valve. - Echo (6/19): EF 30-35%, mild LV dilation with diffuse hypokinesis, normal RV size with mildly decreased systolic function, normal-appearing bioprosthetic mitral and aortic valves.   - Echo (12/19): EF 50-55%, moderate LVH, bioprosthetic aortic valve with mean gradient 10 mmHg, normally-functioning bioprosthetic mitral valve.  - Echo (11/21): EF 50-55%,  mild LVH, mildly dilated RV with normal function, bioprosthetic mitral valve with mean gradient 5 mmHg and no MR, bioprosthetic aortic valve with mean gradient 12 and no AI.   - TEE (7/22): EF 50-55%, septal HK, normal RV size/systolic function, LA appendage clipped, bioprosthetic aortic and mitral valves appeared normal.  - Echo (5/24): EF 50-55% with normal RV, PASP 33 mmHg, s/p bioprosthetic MVR (mean gradient 4 mmHg, no MR), s/p bioprosthetic AVR (mean gradient 9, no AI).  12. Chronic systolic CHF 13. Deafness 14. CKD: stage 3.  15. 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  17. COPD: Mild obstruction on 9/18 PFTs.  - PFTs (10/20): Mild obstruction, decreased DLCO.  18. Post-op CHB in 12/18 with Medtronic dual chamber PPM.  19. Chronic systolic CHF: Echo (1/19) with EF down to 30-35% post-op.  - TEE (5/19) with EF 25-30%, normal bioprosthetic aortic valve, normal bioprosthetic mitral valve.  - Echo (12/19): EF 50-55%, moderate LVH, bioprosthetic aortic valve with mean gradient 10 mmHg, normally-functioning bioprosthetic mitral valve.  20. Atrial flutter: DCCV in 5/19.  21. OSA: Not using CPAP.  22. Interstitial lung disease: PFTs in 1/22 concerning for restriction.  High resolution chest CT in  2/22 with possible UIP.  - HRCT chest (1/24): Not suggestive of ILD per Dr. Theophilus.  23. Atrial tachycardia: Pace-terminated.   SH: Quit smoking 8/18.  Married, 2 children, retired.    Family History  Problem Relation Age of Onset   Stroke Father        No details   Angina Mother    Kidney cancer Brother    Review of systems complete and found to be negative unless listed in HPI.    Current Outpatient Medications  Medication Sig Dispense Refill   Accu-Chek Softclix Lancets lancets Use as directed to check blood sugar daily and as needed 100 each 3    acetaminophen  (TYLENOL ) 500 MG tablet Take 1,000 mg by mouth every 6 (six) hours as needed (pain.).     amiodarone  (PACERONE ) 200 MG tablet Take 0.5 tablets (100 mg total) by mouth daily. 45 tablet 3   Blood Glucose Monitoring Suppl (ACCU-CHEK GUIDE) w/Device KIT check your sugar once daily and as needed as directed 1 kit 0   budesonide -formoterol  (SYMBICORT ) 160-4.5 MCG/ACT inhaler Inhale 2 puffs into the lungs 2 times daily at 12 noon and 4 pm. 10.2 g 12   calcium  carbonate (TUMS - DOSED IN MG ELEMENTAL CALCIUM ) 500 MG chewable tablet Chew 1 tablet by mouth daily as needed for indigestion or heartburn.     Cholecalciferol (VITAMIN D3) 50 MCG (2000 UT) capsule Take 2,000 Units by mouth daily.     citalopram  (CELEXA ) 20 MG tablet Take 1 tablet (20 mg total) by mouth at bedtime. 30 tablet 5   docusate sodium  (COLACE) 100 MG capsule Take 1 capsule (100 mg total) by mouth 3 (three) times daily as needed. 20 capsule 0   empagliflozin  (JARDIANCE ) 10 MG TABS tablet TAKE 1 TABLET BY MOUTH ONCE A DAY BEFORE BREAKFAST 90 tablet 3   furosemide  (LASIX ) 20 MG tablet Take 1 tablet (20 mg total) by mouth every other day. 45 tablet 3   glucose blood (ACCU-CHEK GUIDE) test strip Use as directed to check blood sugar once daily and as needed 100 strip 3   loratadine (CLARITIN) 10 MG tablet Take 10 mg by mouth daily as needed for allergies or rhinitis.     methocarbamol  (ROBAXIN ) 500 MG tablet Take 1 tablet (500 mg total) by mouth every 8 (eight) hours as needed for muscle spasms. 30 tablet 0   pantoprazole  (PROTONIX ) 40 MG tablet Take 1 tablet (40 mg total) by mouth daily. 90 tablet 3   Pseudoeph-Doxylamine-DM-APAP (NYQUIL PO) Take 30 mLs by mouth daily as needed (Cold Symptons).     rosuvastatin  (CRESTOR ) 10 MG tablet TAKE 1 TABLET EVERY DAY 90 tablet 3   sacubitril -valsartan  (ENTRESTO ) 24-26 MG Take 1 tablet by mouth 2 (two) times daily. 180 tablet 2   spironolactone  (ALDACTONE ) 25 MG tablet TAKE 1 TABLET EVERY DAY  90 tablet 3   tirzepatide  (MOUNJARO ) 7.5 MG/0.5ML Pen Inject 7.5 mg into the skin once a week. 2 mL 0   traZODone  (DESYREL ) 50 MG tablet Take 100 mg by mouth at bedtime.     triamcinolone  ointment (KENALOG ) 0.1 % Apply 1 Application topically 2 (two) times daily for 21 days. 60 g 0   warfarin (COUMADIN ) 2.5 MG tablet TAKE 1 TO 1 AND 1/2 TABLETS DAILY OR AS DIRECTED BY THE COUMADIN  CLINIC (Patient taking differently: TAKE1 tablet on Monday Wednesday Thursday Friday and Saturday 1 and1/2 TABLETS on Sundays and Tuesday  OR AS DIRECTED BY THE COUMADIN  CLINIC) 115 tablet 3  No current facility-administered medications for this encounter.   BP 120/64   Pulse (!) 56   Ht 5' 11 (1.803 m)   Wt 104.5 kg (230 lb 6.4 oz)   SpO2 96%   BMI 32.13 kg/m   Wt Readings from Last 3 Encounters:  06/04/24 104.5 kg (230 lb 6.4 oz)  03/06/24 111.8 kg (246 lb 6.4 oz)  02/05/24 112.8 kg (248 lb 9.6 oz)   Physical Exam General:  NAD. No resp difficulty, walked into clinic with cane HEENT: +HOH Neck: Supple. No JVD. Cor: [place] rate & rhythm. No rubs, gallops or murmurs. Lungs: Clear, decreased BS in bases Abdomen: Soft, obese, nontender, nondistended.  Extremities: No cyanosis, clubbing, rash, edema Neuro: Alert & oriented x 3, moves all 4 extremities w/o difficulty. Affect pleasant.  Assessment/Plan: 1. Valvular heart disease: TEE in 9/18 showed severe MR, probably infarct-related with restrictive posterior mitral leaflet. There was moderate aortic stenosis and moderate-severe aortic insufficiency, possible rheumatic.  In 12/18, he had bioprosthetic MVR and AVR. Valves stable on most recent echo in 5/24. - Update echo next visit. - Antibiotic prophylaxis needed with dental work. Refill amox Rx today. 2. Chronic systolic => diastolic CHF: In setting of significant valvular disease as above. EF down to 30-35% post-op (1/19 echo), likely reflects true LV systolic function without the volume load from severe  MR and moderate-severe AI.  TEE in 5/19 showed EF 25-30%.  Repeat echo in 6/19 with EF 30-35%. Repeat echo in 12/19 showed EF up to 50-55%, echo in 11/21 again showed EF 50-55% with normal RV systolic function.  TEE in 7/22 showed EF 50-55% with normal RV.  Echo in 5/24 with EF 50-55%, normal RV.  NYHA class II symptoms, not volume overloaded on exam.  - Continue Entresto  24/26 bid.   - Continue Lasix  20 mg every other day. BMET today.  - Continue spironolactone  25 mg daily. - Continue Jardiance  10 mg daily. No GU symptoms. - With wide PR interval, now off Coreg  to avoid any increase in RV pacing.  3. CKD: Stage 3. Continue Jardiance .  - BMET today and will forward to Nephrology, Dr. Prescilla. 4. Atrial fibrillation/flutter: Paroxysmal. He has had a Maze procedure.  He tends to tolerate atrial arrhythmias poorly.  He had atrial flutter with DCCV in 5/19.  Recurrent atrial fibrillation in 2020.  Stopped amiodarone  and went into atrial fibrillation in 6/22, very symptomatic.  Amiodarone  restarted and he was cardioverted to NSR in 7/22.   - He is on warfarin. INR followed by coumadin  clinic.   - With markedly wide 1st degree AVB, amio has been decreased to 100 mg daily.  Recent LFTs and TSH were stable.  He will need regular eye exams.  CT chest in past showed possible early ILD, ESR was only mildly elevated.  He saw pulmonary, not definite amiodarone -induced lung toxicity but need to follow closely.  Would prefer him off amiodarone , but he developed atrial tachycardia with RVR when we decreased the dose.  He will have a repeat HRCT chest in 3/25 with Dr. Theophilus.  5. COPD: Mild obstruction on 10/20 PFTs, decreased DLCO.  He has quit smoking.   - Follows with pulmonary.  6. CVA: Remote, minimal residual. S/p carotid stenting.  - No ASA given warfarin use.  7. Hyperlipidemia: Continue Crestor .  LDL good 9/24 8. Anemia: Fe deficiency.  9. CAD: Patient had an anomalous right system on cath, there were  two vessels to the right off the right cusp =>  one provided the PDA and the other the PLV.  The artery providing the PDA was occluded with left to right collaterals.  He had SVG-PDA in 12/18. No chest pain.  - Continue statin.      - No ASA given warfarin use.  10. CHB: has Medtronic PPM.  He a-paces a high percentage of the time but there is minimal RV pacing.  - Now off Coreg  as above and on low-dose amiodarone  with very long PR.  11. OSA: Severe OSA, he does not want to use CPAP.  12. Obesity: Body mass index is 32.13 kg/m. - He is now on tirzepatide , down almost 20 lbs. Congratulated. 13. HTN: BP controlled.  14. Interstitial lung disease: Restrictive PFTs in 1/22.  High resolution CT chest concerning for ILD, possibly usual interstitial pneumonitis.  He saw pulmonary, could not rule out amiodarone  lung toxicity so tried him off amiodarone .  He quickly went back into atrial fibrillation that was tolerated poorly, so amiodarone  restarted.  We again tried to wean amiodarone  but he developed atrial tachycardia with RVR.  Repeat HRCT in 1/24 not clearly ILD per pulmonary.  - Continue to followup with pulmonary.  Repeat HRCT chest in 9/25.  - Continue amiodarone  100 mg daily.  Follow up in 4 months with Dr. Rolan + echo  Harlene HERO [facility] FNP-BC 06/04/2024

## 2024-06-04 ENCOUNTER — Encounter (HOSPITAL_COMMUNITY): Payer: Self-pay

## 2024-06-04 ENCOUNTER — Ambulatory Visit (HOSPITAL_COMMUNITY)
Admission: RE | Admit: 2024-06-04 | Discharge: 2024-06-04 | Disposition: A | Discharge status: Home / Self Care | Payer: Medicare HMO | Source: Ambulatory Visit | Attending: Family Medicine | Admitting: Family Medicine

## 2024-06-04 ENCOUNTER — Ambulatory Visit (HOSPITAL_COMMUNITY): Payer: Self-pay | Admitting: Family Medicine

## 2024-06-04 VITALS — BP 120/64 | HR 56 | Ht 71.0 in | Wt 230.4 lb

## 2024-06-04 DIAGNOSIS — E669 Obesity, unspecified: Secondary | ICD-10-CM | POA: Diagnosis not present

## 2024-06-04 DIAGNOSIS — Z79899 Other long term (current) drug therapy: Secondary | ICD-10-CM | POA: Diagnosis not present

## 2024-06-04 DIAGNOSIS — D631 Anemia in chronic kidney disease: Secondary | ICD-10-CM | POA: Diagnosis not present

## 2024-06-04 DIAGNOSIS — Z8679 Personal history of other diseases of the circulatory system: Secondary | ICD-10-CM | POA: Diagnosis present

## 2024-06-04 DIAGNOSIS — Z7984 Long term (current) use of oral hypoglycemic drugs: Secondary | ICD-10-CM | POA: Insufficient documentation

## 2024-06-04 DIAGNOSIS — I2510 Atherosclerotic heart disease of native coronary artery without angina pectoris: Secondary | ICD-10-CM | POA: Insufficient documentation

## 2024-06-04 DIAGNOSIS — Z9889 Other specified postprocedural states: Secondary | ICD-10-CM | POA: Insufficient documentation

## 2024-06-04 DIAGNOSIS — I484 Atypical atrial flutter: Secondary | ICD-10-CM | POA: Diagnosis not present

## 2024-06-04 DIAGNOSIS — I130 Hypertensive heart and chronic kidney disease with heart failure and stage 1 through stage 4 chronic kidney disease, or unspecified chronic kidney disease: Secondary | ICD-10-CM | POA: Diagnosis not present

## 2024-06-04 DIAGNOSIS — I4892 Unspecified atrial flutter: Secondary | ICD-10-CM | POA: Diagnosis not present

## 2024-06-04 DIAGNOSIS — I480 Paroxysmal atrial fibrillation: Secondary | ICD-10-CM | POA: Insufficient documentation

## 2024-06-04 DIAGNOSIS — Z7901 Long term (current) use of anticoagulants: Secondary | ICD-10-CM | POA: Insufficient documentation

## 2024-06-04 DIAGNOSIS — G4733 Obstructive sleep apnea (adult) (pediatric): Secondary | ICD-10-CM | POA: Diagnosis not present

## 2024-06-04 DIAGNOSIS — I5032 Chronic diastolic (congestive) heart failure: Secondary | ICD-10-CM | POA: Diagnosis not present

## 2024-06-04 DIAGNOSIS — Z6832 Body mass index (BMI) 32.0-32.9, adult: Secondary | ICD-10-CM | POA: Insufficient documentation

## 2024-06-04 DIAGNOSIS — D509 Iron deficiency anemia, unspecified: Secondary | ICD-10-CM | POA: Insufficient documentation

## 2024-06-04 DIAGNOSIS — E785 Hyperlipidemia, unspecified: Secondary | ICD-10-CM | POA: Insufficient documentation

## 2024-06-04 DIAGNOSIS — I5022 Chronic systolic (congestive) heart failure: Secondary | ICD-10-CM

## 2024-06-04 DIAGNOSIS — Z952 Presence of prosthetic heart valve: Secondary | ICD-10-CM | POA: Diagnosis not present

## 2024-06-04 DIAGNOSIS — J449 Chronic obstructive pulmonary disease, unspecified: Secondary | ICD-10-CM | POA: Insufficient documentation

## 2024-06-04 DIAGNOSIS — I10 Essential (primary) hypertension: Secondary | ICD-10-CM

## 2024-06-04 DIAGNOSIS — I442 Atrioventricular block, complete: Secondary | ICD-10-CM

## 2024-06-04 DIAGNOSIS — N183 Chronic kidney disease, stage 3 unspecified: Secondary | ICD-10-CM | POA: Diagnosis not present

## 2024-06-04 DIAGNOSIS — N1832 Chronic kidney disease, stage 3b: Secondary | ICD-10-CM | POA: Diagnosis not present

## 2024-06-04 DIAGNOSIS — J849 Interstitial pulmonary disease, unspecified: Secondary | ICD-10-CM | POA: Insufficient documentation

## 2024-06-04 DIAGNOSIS — Z8673 Personal history of transient ischemic attack (TIA), and cerebral infarction without residual deficits: Secondary | ICD-10-CM | POA: Diagnosis not present

## 2024-06-04 DIAGNOSIS — J441 Chronic obstructive pulmonary disease with (acute) exacerbation: Secondary | ICD-10-CM | POA: Diagnosis not present

## 2024-06-04 DIAGNOSIS — Z953 Presence of xenogenic heart valve: Secondary | ICD-10-CM | POA: Diagnosis not present

## 2024-06-04 LAB — BASIC METABOLIC PANEL WITH GFR
Anion gap: 11 (ref 5–15)
BUN: 24 mg/dL — ABNORMAL HIGH (ref 8–23)
CO2: 19 mmol/L — ABNORMAL LOW (ref 22–32)
Calcium: 8.8 mg/dL — ABNORMAL LOW (ref 8.9–10.3)
Chloride: 107 mmol/L (ref 98–111)
Creatinine, Ser: 2.11 mg/dL — ABNORMAL HIGH (ref 0.61–1.24)
GFR, Estimated: 31 mL/min — ABNORMAL LOW (ref >60)
Glucose, Bld: 98 mg/dL (ref 70–99)
Potassium: 4.2 mmol/L (ref 3.5–5.1)
Sodium: 137 mmol/L (ref 135–145)

## 2024-06-04 MED ORDER — AMOXICILLIN 500 MG PO CAPS: 2000.0000 mg | ORAL_CAPSULE | ORAL | 1 refills | Status: AC | PRN

## 2024-06-04 NOTE — Patient Instructions (Addendum)
 Good to see you today!  Take Amoxicillin  2 g (4 tablets) prior to dental procedure  Labs done today, your results will be available in MyChart, we will contact you for abnormal readings. Your physician has requested that you have an echocardiogram. Echocardiography is a painless test that uses sound waves to create images of your heart. It provides your doctor with information about the size and shape of your heart and how well your heart's chambers and valves are working. This procedure takes approximately one hour. There are no restrictions for this procedure. Please do NOT wear cologne, perfume, aftershave, or lotions (deodorant is allowed). Please arrive 15 minutes prior to your appointment time.  Please note: We ask at that you not bring children with you during ultrasound (echo/ vascular) testing. Due to room size and safety concerns, children are not allowed in the ultrasound rooms during exams. Our front office staff cannot provide observation of children in our lobby area while testing is being conducted. An adult accompanying a patient to their appointment will only be allowed in the ultrasound room at the discretion of the ultrasound technician under special circumstances. We apologize for any inconvenience.  Your physician recommends that you schedule a follow-up appointment 4 months with echocardiogram (October) Call office in August to schedule an appointmnet  If you have any questions or concerns before your next appointment please send us  a message through [facility] or call our office at [phone].    TO LEAVE A MESSAGE FOR THE NURSE SELECT OPTION 2, PLEASE LEAVE A MESSAGE INCLUDING: YOUR NAME DATE OF BIRTH CALL BACK NUMBER REASON FOR CALL**this is important as we prioritize the call backs  YOU WILL RECEIVE A CALL BACK THE SAME DAY AS LONG AS YOU CALL BEFORE 4:00 PM At the [facility], you and your health needs are our priority. As part of our continuing  mission to provide you with exceptional heart care, we have created designated Provider Care Teams. These Care Teams include your primary Cardiologist (physician) and Advanced Practice Providers (APPs- Physician Assistants and Nurse Practitioners) who all work together to provide you with the care you need, when you need it.   You may see any of the following providers on your designated Care Team at your next follow up: Dr Toribio Fuel Dr Ezra Shuck Dr. Ria Commander Dr. Morene Brownie Amy Lenetta, NP Caffie Shed, [place] [facility] [place], [place] Beckey Coe, NP [place] Lee, NP Ellouise Class, NP Tinnie Redman, PharmD Jaun Bash, PharmD   Please be sure to bring in all your medications bottles to every appointment.    Thank you for choosing [facility]

## 2024-06-10 ENCOUNTER — Other Ambulatory Visit (HOSPITAL_COMMUNITY): Payer: Self-pay

## 2024-06-10 ENCOUNTER — Encounter: Payer: Self-pay | Admitting: Pharmacist

## 2024-06-10 ENCOUNTER — Ambulatory Visit: Admitting: Podiatry

## 2024-06-10 DIAGNOSIS — B351 Tinea unguium: Secondary | ICD-10-CM

## 2024-06-10 DIAGNOSIS — M79674 Pain in right toe(s): Secondary | ICD-10-CM

## 2024-06-10 DIAGNOSIS — E1122 Type 2 diabetes mellitus with diabetic chronic kidney disease: Secondary | ICD-10-CM

## 2024-06-10 DIAGNOSIS — Z7901 Long term (current) use of anticoagulants: Secondary | ICD-10-CM

## 2024-06-10 DIAGNOSIS — M79675 Pain in left toe(s): Secondary | ICD-10-CM

## 2024-06-10 MED ORDER — MOUNJARO 10 MG/0.5ML ~~LOC~~ SOAJ
10.0000 mg | SUBCUTANEOUS | 0 refills | Status: DC
  Filled 2024-06-10: qty 2, 28d supply, fill #0

## 2024-06-10 NOTE — Progress Notes (Signed)
This patient returns to my office for at risk foot care.  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  This patient is unable to cut nails himself since the patient cannot reach his nails.These nails are painful walking and wearing shoes.  This patient presents for at risk foot care today.  General Appearance  Alert, conversant and in no acute stress.  Vascular  Dorsalis pedis and posterior tibial  pulses are palpable  bilaterally.  Capillary return is within normal limits  bilaterally. Temperature is within normal limits  bilaterally.  Neurologic  Senn-Weinstein monofilament wire test within normal limits  bilaterally. Muscle power within normal limits bilaterally.  Nails Thick disfigured discolored nails with subungual debris  from hallux to fifth toes bilaterally. No evidence of bacterial infection or drainage bilaterally.  Orthopedic  No limitations of motion  feet .  No crepitus or effusions noted.  No bony pathology or digital deformities noted.  Skin  normotropic skin with no porokeratosis noted bilaterally.  No signs of infections or ulcers noted.   Asymptomatic heel callus.  Onychomycosis  Pain in right toes  Pain in left toes  Consent was obtained for treatment procedures.   Mechanical debridement of nails 1-5  bilaterally performed with a nail nipper.  Filed with dremel without incident.    Return office visit    3 months                  Told patient to return for periodic foot care and evaluation due to potential at risk complications.   Helane Gunther DPM

## 2024-06-10 NOTE — Addendum Note (Signed)
 Addended by: DARRELL BRUCKNER on: 06/10/2024 12:08 PM   Modules accepted: Orders

## 2024-06-15 ENCOUNTER — Other Ambulatory Visit (HOSPITAL_COMMUNITY): Payer: Self-pay | Admitting: Cardiology

## 2024-06-17 ENCOUNTER — Other Ambulatory Visit (HOSPITAL_COMMUNITY): Payer: Self-pay

## 2024-06-17 MED ORDER — EMPAGLIFLOZIN 10 MG PO TABS
10.0000 mg | ORAL_TABLET | Freq: Every morning | ORAL | 11 refills | Status: AC
  Filled 2024-06-17: qty 30, 30d supply, fill #0
  Filled 2024-07-12: qty 30, 30d supply, fill #1
  Filled 2024-07-15: qty 90, 90d supply, fill #2
  Filled 2024-08-06: qty 30, 30d supply, fill #2
  Filled 2024-09-07: qty 30, 30d supply, fill #3
  Filled 2024-10-03: qty 30, 30d supply, fill #4
  Filled 2024-10-30: qty 30, 30d supply, fill #5
  Filled 2024-11-29: qty 30, 30d supply, fill #6
  Filled 2024-12-26: qty 30, 30d supply, fill #7
  Filled 2024-12-30: qty 90, 90d supply, fill #7

## 2024-06-18 ENCOUNTER — Ambulatory Visit: Attending: Internal Medicine | Admitting: *Deleted

## 2024-06-18 DIAGNOSIS — Z5181 Encounter for therapeutic drug level monitoring: Secondary | ICD-10-CM | POA: Diagnosis not present

## 2024-06-18 DIAGNOSIS — I480 Paroxysmal atrial fibrillation: Secondary | ICD-10-CM

## 2024-06-18 DIAGNOSIS — Z9889 Other specified postprocedural states: Secondary | ICD-10-CM | POA: Diagnosis not present

## 2024-06-18 DIAGNOSIS — Z951 Presence of aortocoronary bypass graft: Secondary | ICD-10-CM

## 2024-06-18 DIAGNOSIS — Z8679 Personal history of other diseases of the circulatory system: Secondary | ICD-10-CM

## 2024-06-18 DIAGNOSIS — Z953 Presence of xenogenic heart valve: Secondary | ICD-10-CM | POA: Diagnosis not present

## 2024-06-18 LAB — POCT INR: INR: 2.1 (ref 2.0–3.0)

## 2024-06-18 NOTE — Progress Notes (Signed)
Please see anticoagulation encounter.

## 2024-06-18 NOTE — Patient Instructions (Addendum)
 Description   Continue taking 1 tablet daily except 1/2 tablet on Wednesdays.  Stay consistent with greens (2-3 times per week).  Recheck INR in 5 weeks.  Coumadin  Clinic [phone]. HAVE DENTAL OFFICE FAX CLEARANCE FORM TO CARDIOLOGY OFFICE.

## 2024-06-21 ENCOUNTER — Encounter: Payer: Self-pay | Admitting: Internal Medicine

## 2024-07-11 ENCOUNTER — Encounter: Payer: Self-pay | Admitting: Pharmacist

## 2024-07-11 ENCOUNTER — Other Ambulatory Visit (HOSPITAL_COMMUNITY): Payer: Self-pay | Admitting: Cardiology

## 2024-07-11 DIAGNOSIS — E1122 Type 2 diabetes mellitus with diabetic chronic kidney disease: Secondary | ICD-10-CM

## 2024-07-12 ENCOUNTER — Other Ambulatory Visit (HOSPITAL_COMMUNITY): Payer: Self-pay

## 2024-07-12 MED ORDER — MOUNJARO 12.5 MG/0.5ML ~~LOC~~ SOAJ
12.5000 mg | SUBCUTANEOUS | 0 refills | Status: DC
  Filled 2024-07-12: qty 2, 28d supply, fill #0

## 2024-07-15 ENCOUNTER — Other Ambulatory Visit (HOSPITAL_COMMUNITY): Payer: Self-pay

## 2024-07-16 ENCOUNTER — Other Ambulatory Visit (HOSPITAL_COMMUNITY): Payer: Self-pay

## 2024-07-23 ENCOUNTER — Ambulatory Visit: Attending: Internal Medicine

## 2024-07-23 DIAGNOSIS — Z953 Presence of xenogenic heart valve: Secondary | ICD-10-CM

## 2024-07-23 DIAGNOSIS — Z5181 Encounter for therapeutic drug level monitoring: Secondary | ICD-10-CM

## 2024-07-23 DIAGNOSIS — Z951 Presence of aortocoronary bypass graft: Secondary | ICD-10-CM | POA: Diagnosis not present

## 2024-07-23 DIAGNOSIS — Z8679 Personal history of other diseases of the circulatory system: Secondary | ICD-10-CM

## 2024-07-23 DIAGNOSIS — I480 Paroxysmal atrial fibrillation: Secondary | ICD-10-CM

## 2024-07-23 LAB — POCT INR: INR: 2.7 (ref 2.0–3.0)

## 2024-07-23 NOTE — Progress Notes (Signed)
 INR 2.7; Please see anticoagulation encounter

## 2024-07-23 NOTE — Patient Instructions (Signed)
 Continue taking 1 tablet daily except 1/2 tablet on Wednesdays.  Stay consistent with greens (2-3 times per week).  Recheck INR in 5 weeks.  Coumadin  Clinic [phone]. HAVE DENTAL OFFICE FAX CLEARANCE FORM TO CARDIOLOGY OFFICE.

## 2024-08-01 NOTE — Progress Notes (Signed)
 Remote pacemaker transmission.

## 2024-08-06 ENCOUNTER — Other Ambulatory Visit (HOSPITAL_COMMUNITY): Payer: Self-pay

## 2024-08-06 ENCOUNTER — Encounter: Payer: Self-pay | Admitting: Pharmacist

## 2024-08-06 DIAGNOSIS — E1122 Type 2 diabetes mellitus with diabetic chronic kidney disease: Secondary | ICD-10-CM

## 2024-08-07 ENCOUNTER — Other Ambulatory Visit (HOSPITAL_COMMUNITY): Payer: Self-pay

## 2024-08-07 ENCOUNTER — Other Ambulatory Visit: Payer: Self-pay

## 2024-08-07 MED ORDER — TRIAMCINOLONE ACETONIDE 0.1 % EX OINT
1.0000 | TOPICAL_OINTMENT | Freq: Two times a day (BID) | CUTANEOUS | 0 refills | Status: AC
  Filled 2024-08-07: qty 60, 30d supply, fill #0

## 2024-08-07 MED ORDER — MOUNJARO 15 MG/0.5ML ~~LOC~~ SOAJ
15.0000 mg | SUBCUTANEOUS | 11 refills | Status: AC
  Filled 2024-08-07: qty 2, 28d supply, fill #0
  Filled 2024-09-07: qty 2, 28d supply, fill #1
  Filled 2024-10-03: qty 2, 28d supply, fill #2
  Filled 2024-10-30: qty 2, 28d supply, fill #3
  Filled 2024-11-29: qty 2, 28d supply, fill #4
  Filled 2024-12-26 – 2024-12-30 (×2): qty 2, 28d supply, fill #5

## 2024-08-13 ENCOUNTER — Ambulatory Visit: Admitting: Pulmonary Disease

## 2024-08-13 DIAGNOSIS — R0600 Dyspnea, unspecified: Secondary | ICD-10-CM

## 2024-08-13 DIAGNOSIS — J849 Interstitial pulmonary disease, unspecified: Secondary | ICD-10-CM | POA: Diagnosis not present

## 2024-08-13 LAB — PULMONARY FUNCTION TEST
DL/VA % pred: 60 %
DL/VA: 2.36 ml/min/mmHg/L
DLCO unc % pred: 52 %
DLCO unc: 13.4 ml/min/mmHg
FEF 25-75 Post: 2.58 L/s
FEF 25-75 Pre: 2.36 L/s
FEF2575-%Change-Post: 9 %
FEF2575-%Pred-Post: 118 %
FEF2575-%Pred-Pre: 108 %
FEV1-%Change-Post: 1 %
FEV1-%Pred-Post: 94 %
FEV1-%Pred-Pre: 92 %
FEV1-Post: 2.9 L
FEV1-Pre: 2.85 L
FEV1FVC-%Change-Post: 0 %
FEV1FVC-%Pred-Pre: 102 %
FEV6-%Change-Post: 2 %
FEV6-%Pred-Post: 97 %
FEV6-%Pred-Pre: 95 %
FEV6-Post: 3.9 L
FEV6-Pre: 3.81 L
FEV6FVC-%Change-Post: 0 %
FEV6FVC-%Pred-Post: 105 %
FEV6FVC-%Pred-Pre: 105 %
FVC-%Change-Post: 2 %
FVC-%Pred-Post: 91 %
FVC-%Pred-Pre: 89 %
FVC-Post: 3.93 L
FVC-Pre: 3.85 L
Post FEV1/FVC ratio: 74 %
Post FEV6/FVC ratio: 99 %
Pre FEV1/FVC ratio: 74 %
Pre FEV6/FVC Ratio: 99 %
RV % pred: 132 %
RV: 3.54 L
TLC % pred: 102 %
TLC: 7.46 L

## 2024-08-13 NOTE — Patient Instructions (Signed)
 Full pft performed today

## 2024-08-13 NOTE — Progress Notes (Signed)
 Full pft performed today

## 2024-08-14 ENCOUNTER — Ambulatory Visit
Admission: RE | Admit: 2024-08-14 | Discharge: 2024-08-14 | Disposition: A | Discharge status: Home / Self Care | Source: Ambulatory Visit | Attending: Pulmonary Disease

## 2024-08-14 DIAGNOSIS — J849 Interstitial pulmonary disease, unspecified: Secondary | ICD-10-CM

## 2024-08-14 DIAGNOSIS — R0600 Dyspnea, unspecified: Secondary | ICD-10-CM

## 2024-08-19 ENCOUNTER — Ambulatory Visit: Payer: Self-pay | Admitting: Pulmonary Disease

## 2024-08-26 DIAGNOSIS — I10 Essential (primary) hypertension: Secondary | ICD-10-CM | POA: Diagnosis not present

## 2024-08-26 DIAGNOSIS — R252 Cramp and spasm: Secondary | ICD-10-CM | POA: Diagnosis not present

## 2024-08-26 DIAGNOSIS — E782 Mixed hyperlipidemia: Secondary | ICD-10-CM | POA: Diagnosis not present

## 2024-08-26 DIAGNOSIS — Z23 Encounter for immunization: Secondary | ICD-10-CM | POA: Diagnosis not present

## 2024-08-26 DIAGNOSIS — E1169 Type 2 diabetes mellitus with other specified complication: Secondary | ICD-10-CM | POA: Diagnosis not present

## 2024-08-26 DIAGNOSIS — I480 Paroxysmal atrial fibrillation: Secondary | ICD-10-CM | POA: Diagnosis not present

## 2024-08-26 DIAGNOSIS — N184 Chronic kidney disease, stage 4 (severe): Secondary | ICD-10-CM | POA: Diagnosis not present

## 2024-08-26 DIAGNOSIS — E1122 Type 2 diabetes mellitus with diabetic chronic kidney disease: Secondary | ICD-10-CM | POA: Diagnosis not present

## 2024-08-26 DIAGNOSIS — Z0000 Encounter for general adult medical examination without abnormal findings: Secondary | ICD-10-CM | POA: Diagnosis not present

## 2024-08-26 DIAGNOSIS — I5032 Chronic diastolic (congestive) heart failure: Secondary | ICD-10-CM | POA: Diagnosis not present

## 2024-08-26 LAB — LAB REPORT - SCANNED
A1c: 5.7
Creatinine, POC: 125 mg/dL
EGFR: 39
Microalb Creat Ratio: 6.2
Microalbumin, Urine: 0.78

## 2024-08-27 ENCOUNTER — Ambulatory Visit: Attending: Internal Medicine | Admitting: *Deleted

## 2024-08-27 DIAGNOSIS — Z8679 Personal history of other diseases of the circulatory system: Secondary | ICD-10-CM

## 2024-08-27 DIAGNOSIS — Z953 Presence of xenogenic heart valve: Secondary | ICD-10-CM | POA: Diagnosis not present

## 2024-08-27 DIAGNOSIS — Z951 Presence of aortocoronary bypass graft: Secondary | ICD-10-CM

## 2024-08-27 DIAGNOSIS — Z9889 Other specified postprocedural states: Secondary | ICD-10-CM

## 2024-08-27 DIAGNOSIS — I480 Paroxysmal atrial fibrillation: Secondary | ICD-10-CM | POA: Diagnosis not present

## 2024-08-27 DIAGNOSIS — Z5181 Encounter for therapeutic drug level monitoring: Secondary | ICD-10-CM | POA: Diagnosis not present

## 2024-08-27 LAB — POCT INR: INR: 2.3 (ref 2.0–3.0)

## 2024-08-27 NOTE — Progress Notes (Signed)
 Description   INR-2.3; Continue taking 1 tablet daily except 1/2 tablet on Wednesdays.  Stay consistent with greens (2-3 times per week).  Recheck INR in 6 weeks.  Coumadin  Clinic [phone]. HAVE DENTAL OFFICE FAX CLEARANCE FORM TO CARDIOLOGY OFFICE.

## 2024-08-27 NOTE — Patient Instructions (Signed)
 Description   INR-2.3; Continue taking 1 tablet daily except 1/2 tablet on Wednesdays.  Stay consistent with greens (2-3 times per week).  Recheck INR in 6 weeks.  Coumadin  Clinic [phone]. HAVE DENTAL OFFICE FAX CLEARANCE FORM TO CARDIOLOGY OFFICE.

## 2024-08-29 ENCOUNTER — Ambulatory Visit (INDEPENDENT_AMBULATORY_CARE_PROVIDER_SITE_OTHER)

## 2024-08-29 ENCOUNTER — Other Ambulatory Visit: Payer: Self-pay | Admitting: Family Medicine

## 2024-08-29 DIAGNOSIS — R0989 Other specified symptoms and signs involving the circulatory and respiratory systems: Secondary | ICD-10-CM

## 2024-08-29 DIAGNOSIS — I442 Atrioventricular block, complete: Secondary | ICD-10-CM | POA: Diagnosis not present

## 2024-08-29 LAB — CUP PACEART REMOTE DEVICE CHECK
Battery Remaining Longevity: 95 mo
Battery Voltage: 2.99 V
Brady Statistic AP VP Percent: 0.02 %
Brady Statistic AP VS Percent: 2.46 %
Brady Statistic AS VP Percent: 0.09 %
Brady Statistic AS VS Percent: 97.44 %
Brady Statistic RA Percent Paced: 2.46 %
Brady Statistic RV Percent Paced: 0.1 %
Date Time Interrogation Session: 20250917183952
Implantable Lead Connection Status: 753985
Implantable Lead Connection Status: 753985
Implantable Lead Implant Date: 20181211
Implantable Lead Implant Date: 20181211
Implantable Lead Location: 753859
Implantable Lead Location: 753860
Implantable Lead Model: 3830
Implantable Lead Model: 5076
Implantable Pulse Generator Implant Date: 20181211
Lead Channel Impedance Value: 285 Ohm
Lead Channel Impedance Value: 323 Ohm
Lead Channel Impedance Value: 399 Ohm
Lead Channel Impedance Value: 418 Ohm
Lead Channel Pacing Threshold Amplitude: 0.5 V
Lead Channel Pacing Threshold Amplitude: 1.625 V
Lead Channel Pacing Threshold Pulse Width: 0.4 ms
Lead Channel Pacing Threshold Pulse Width: 0.4 ms
Lead Channel Sensing Intrinsic Amplitude: 6 mV
Lead Channel Sensing Intrinsic Amplitude: 6 mV
Lead Channel Sensing Intrinsic Amplitude: 6.125 mV
Lead Channel Sensing Intrinsic Amplitude: 6.125 mV
Lead Channel Setting Pacing Amplitude: 2 V
Lead Channel Setting Pacing Amplitude: 2.5 V
Lead Channel Setting Pacing Pulse Width: 0.7 ms
Lead Channel Setting Sensing Sensitivity: 1.2 mV
Zone Setting Status: 755011
Zone Setting Status: 755011

## 2024-08-31 ENCOUNTER — Ambulatory Visit: Payer: Self-pay | Admitting: Cardiology

## 2024-09-02 ENCOUNTER — Ambulatory Visit
Admission: RE | Admit: 2024-09-02 | Discharge: 2024-09-02 | Disposition: A | Discharge status: Home / Self Care | Source: Ambulatory Visit | Attending: Family Medicine | Admitting: Family Medicine

## 2024-09-02 DIAGNOSIS — I6523 Occlusion and stenosis of bilateral carotid arteries: Secondary | ICD-10-CM | POA: Diagnosis not present

## 2024-09-02 DIAGNOSIS — R0989 Other specified symptoms and signs involving the circulatory and respiratory systems: Secondary | ICD-10-CM

## 2024-09-03 NOTE — Progress Notes (Signed)
 Remote PPM Transmission

## 2024-09-05 ENCOUNTER — Ambulatory Visit: Admitting: Pulmonary Disease

## 2024-09-05 ENCOUNTER — Encounter: Payer: Self-pay | Admitting: Pulmonary Disease

## 2024-09-05 VITALS — BP 130/82 | HR 66 | Temp 97.7°F | Ht 71.0 in | Wt 207.0 lb

## 2024-09-05 DIAGNOSIS — J849 Interstitial pulmonary disease, unspecified: Secondary | ICD-10-CM

## 2024-09-05 DIAGNOSIS — R0600 Dyspnea, unspecified: Secondary | ICD-10-CM

## 2024-09-05 NOTE — Patient Instructions (Signed)
  VISIT SUMMARY: Today, we reviewed your lung condition and heart health. Your lung function remains stable with mild scarring noted on recent tests. We also discussed your asthma management and a recent finding of carotid artery stenosis.  YOUR PLAN: EARLY INTERSTITIAL LUNG DISEASE: You have mild scarring at the base of your lungs, which is stable and possibly related to your medication. -Continue taking amiodarone  100 mg daily. -Follow up with a CT scan yearly unless you develop new symptoms.  ASTHMA: You have mild asthma and are currently using the Symbicort  inhaler, which helps with your symptoms. -Continue using the Symbicort  inhaler twice daily.  CAROTID ARTERY STENOSIS: You have a 60% narrowing in the right external carotid artery, but you have no symptoms. -Follow up with a vascular specialist.

## 2024-09-05 NOTE — Progress Notes (Signed)
 Brandon [name]    [id number]    12/28/1945  Primary Care Physician:[facility], [facility]  Referring Physician: Dor[facility] [address] [place],  [place] [address]  Chief complaint:   Follow-up for abnormal CT, concern for ILD, amiodarone  toxicity  HPI: 78 y.o. ex-smoker with significant cardiac history of  CVA, paroxysmal atrial fibrillation, valvular heart disease, and chronic diastolic CHF.  He had a prolonged hospitalization in 2018 for heart failure, A. fib with RVR.  Eventually underwent aortic and mitral valve replacement, Maze procedure, LAA clipping  He was started on amiodarone  around 2018 2019 and continues on at 800 mg a day.  Follows with Dr. Rolan in the heart failure clinic  Over the past few months he has noted increasing dyspnea on exertion.  He had a work-up done including PFTs and CT scan which showed changes suggestive of interstitial lung disease and has been referred here for further evaluation.  Concern raised for amiodarone  toxicity.  He did get a sed rate which shows slightly elevated at 46  Amiodarone  was temporarily held however restarted in June 2022 as he is back in atrial fibrillation He continues to have issues with recurrent atrial fibrillation and underwent ablation on 12/16/2021.  He was attempted to be taken off amiodarone  in 2023 but quickly went back into atrial fibrillation and hence the medication was restarted.  Amiodarone  was reduced to 100 mg daily in February 2025  He had an  ILD panel which showed marked elevation of CCP.  Evaluated by rheumatology in early May 2022 with no findings of rheumatoid arthritis.  Elevated CCP was felt to be incidental  He was tried on Trelegy inhaler but was stopped after it made his breathing worse with increasing coughing  Interim history: Discussed the use of AI scribe software for clinical note transcription with the patient, who gave verbal  consent to proceed.  History of Present Illness Interim history: Brandon [name] is a 78 year old male with interstitial lung disease and heart failure who presents for follow-up of his lung condition.  Dyspnea and pulmonary function - No significant changes in breathing - Recent CT scan and lung function tests show mild scarring at the lung bases - Lung function tests over the years demonstrate fluctuating diffusion capacity  Medication use for pulmonary and cardiac conditions - Continues amiodarone  100 mg daily - Uses Symbicort  inhaler twice daily for past wheezing and possible mild asthma - Receives patient assistance for Symbicort  inhaler - On Mounjaro , Entresto , and Jardiance  for heart failure and lung conditions   Relevant pulmonary history Pets: 2 cats Occupation: Retired Curator at [employer]. Exposures: Exposure to chemicals while working.  No ongoing exposures.  No mold, hot tub, Jacuzzi.  No feather pillows or comforter Smoking history: 30-pack-year smoker.  Quit in 2018 Travel history: No significant travel history Relevant family history: No family history of lung disease  Outpatient Encounter Medications as of 09/05/2024  Medication Sig   Accu-Chek Softclix Lancets lancets Use as directed to check blood sugar daily and as needed   acetaminophen  (TYLENOL ) 500 MG tablet Take 1,000 mg by mouth every 6 (six) hours as needed (pain.).   amiodarone  (PACERONE ) 200 MG tablet Take 0.5 tablets (100 mg total) by mouth daily.   amoxicillin  (AMOXIL ) 500 MG capsule Take 4 capsules (2,000 mg total) by mouth as needed.   ascorbic acid (VITAMIN C) 500 MG tablet  Take 500 mg by mouth daily.   Blood Glucose Monitoring Suppl (ACCU-CHEK GUIDE) w/Device KIT check your sugar once daily and as needed as directed   budesonide -formoterol  (SYMBICORT ) 160-4.5 MCG/ACT inhaler Inhale 2 puffs into the lungs 2 times daily at 12 noon and 4 pm.   calcium  carbonate (TUMS - DOSED IN MG  ELEMENTAL CALCIUM ) 500 MG chewable tablet Chew 1 tablet by mouth daily as needed for indigestion or heartburn.   Cholecalciferol (VITAMIN D3) 50 MCG (2000 UT) capsule Take 2,000 Units by mouth daily.   citalopram  (CELEXA ) 20 MG tablet Take 1 tablet (20 mg total) by mouth at bedtime.   docusate sodium  (COLACE) 100 MG capsule Take 1 capsule (100 mg total) by mouth 3 (three) times daily as needed.   empagliflozin  (JARDIANCE ) 10 MG TABS tablet TAKE 1 TABLET BY MOUTH ONCE A DAY BEFORE BREAKFAST   furosemide  (LASIX ) 20 MG tablet Take 1 tablet (20 mg total) by mouth every other day.   glucose blood (ACCU-CHEK GUIDE) test strip Use as directed to check blood sugar once daily and as needed   loratadine (CLARITIN) 10 MG tablet Take 10 mg by mouth daily as needed for allergies or rhinitis.   methocarbamol  (ROBAXIN ) 500 MG tablet Take 1 tablet (500 mg total) by mouth every 8 (eight) hours as needed for muscle spasms.   pantoprazole  (PROTONIX ) 40 MG tablet Take 1 tablet (40 mg total) by mouth daily.   Pseudoeph-Doxylamine-DM-APAP (NYQUIL PO) Take 30 mLs by mouth daily as needed (Cold Symptons).   rosuvastatin  (CRESTOR ) 10 MG tablet TAKE 1 TABLET EVERY DAY   sacubitril -valsartan  (ENTRESTO ) 24-26 MG Take 1 tablet by mouth 2 (two) times daily.   spironolactone  (ALDACTONE ) 25 MG tablet TAKE 1 TABLET EVERY DAY   tirzepatide  (MOUNJARO ) 15 MG/0.5ML Pen Inject 15 mg into the skin once a week.   traZODone  (DESYREL ) 50 MG tablet Take 100 mg by mouth at bedtime.   triamcinolone  ointment (KENALOG ) 0.1 % Apply 1 Application topically 2 (two) times daily.   warfarin (COUMADIN ) 2.5 MG tablet TAKE 1 TO 1 AND 1/2 TABLETS DAILY OR AS DIRECTED BY THE COUMADIN  CLINIC (Patient taking differently: TAKE1 tablet on Monday Wednesday Thursday Friday and Saturday 1 and1/2 TABLETS on Sundays and Tuesday  OR AS DIRECTED BY THE COUMADIN  CLINIC)   Zinc 50 MG TABS Take 50 mg by mouth daily.   Ascorbic Acid 500 MG CAPS Take 1 capsule by mouth  daily. (Patient not taking: Reported on 09/05/2024)   No facility-administered encounter medications on file as of 09/05/2024.   Physical Exam: Blood pressure 138/60, pulse 92, temperature 98 F (36.7 C), temperature source Oral, height 5' 11 (1.803 m), weight 246 lb 6.4 oz (111.8 kg), SpO2 95%. Gen:      No acute distress HEENT:  EOMI, sclera anicteric Neck:     No masses; no thyromegaly Lungs:    Clear to auscultation bilaterally; normal respiratory effort CV:         Regular rate and rhythm; no murmurs Abd:      + bowel sounds; soft, non-tender; no palpable masses, no distension Ext:    No edema; adequate peripheral perfusion Skin:      Warm and dry; no rash Neuro: alert and oriented x 3 Psych: normal mood and affect   Data Reviewed: Imaging: CT coronary 09/28/17-no interstitial lung disease, mild bibasal atelectasis  High-res CT 01/20/2021-bilateral groundglass attenuation most evident in the mid to lower lungs with mild reticulation, moderate air trapping.  High-res CT 05/08/2021-minimal nonspecific peripheral interstitial opacity, air trapping, bronchial wall thickening.  High-res CT 12/27/2022-moderate patchy air trapping, minimal nonspecific interstitial opacities and reticulation.  High-res CT 02/13/2024-slight increase in reticulation, groundglass opacities, alternate pattern, trace right effusion, mild adenopathy, dilated PA  High-res CT 08/14/2024-unchanged appearance of reticulation, ground glass opacities.  Indeterminate pattern I have reviewed the images personally  PFTs: 01/07/2021 FVC 2.70 (60%), FEV1 2.19 (16%), F/F 81, TLC 6.03 [82%], DLCO 13.20 [50%] Moderate diffusion defect  06/10/2021 FVC 2.95 [6%], FEV1 2.33 [73%], F/F 79, TLC 6.92 [95%], DLCO 17.20 (66%] Mild restriction, diffusion defect.  Improved compared to January 2022  08/15/2023 FVC 4.32 [102%], FEV1 3.54 [119%], F/F82, TLC 7.03 [94%], DLCO 28.35 [111%] Mild restriction  08/11/2023 FVC 3.13 [92%],  FEV1 2.19 [70%], F/F70, TLC 5.59 [77%], DLCO 21.52 [4%] Mild restriction  08/13/2024 FVC 3.93 [91%], FEV1 2.90 [94%], F/F74, TLC 7.46 [102%], DLCO 13.40 [52%] Moderate diffusion defect  Labs: Sed rate 01/29/21-46  CTD serologies 02/24/2021-positive for CCP greater than 250 Assessment & Plan Early interstitial lung disease Mild changes at the base of the lungs with minimal scarring on CT, possibly related to amiodarone  use. Lung function tests show well-managed lung capacity with some fluctuation in diffusion capacity, but overall stable over a longer time span. Amiodarone  effectively controls atrial fibrillation, outweighing potential risks of lung changes. - Continue amiodarone  100 mg daily - Follow up with CT scan yearly unless new symptoms develop  Asthma Wheezing and mild asthma, currently using Symbicort  inhaler twice daily, providing symptomatic relief. No evidence of COPD despite past smoking history. - Continue Symbicort  inhaler twice daily  Carotid artery stenosis Recent ultrasound showed 60% stenosis at the origin of the right external carotid artery. No symptoms reported. - Follow up with vascular specialist   Plan/Recommendations: - Continue Symbicort   Follow-up in 6 months  Lonna Coder MD [facility] 09/05/2024, 11:50 AM  CC: [facility], [facility]

## 2024-09-09 ENCOUNTER — Ambulatory Visit: Admitting: Podiatry

## 2024-09-09 ENCOUNTER — Encounter: Payer: Self-pay | Admitting: Podiatry

## 2024-09-09 DIAGNOSIS — M79675 Pain in left toe(s): Secondary | ICD-10-CM

## 2024-09-09 DIAGNOSIS — Z7901 Long term (current) use of anticoagulants: Secondary | ICD-10-CM

## 2024-09-09 DIAGNOSIS — M79674 Pain in right toe(s): Secondary | ICD-10-CM

## 2024-09-09 DIAGNOSIS — B351 Tinea unguium: Secondary | ICD-10-CM

## 2024-09-09 NOTE — Progress Notes (Signed)
This patient returns to my office for at risk foot care.  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  This patient is unable to cut nails himself since the patient cannot reach his nails.These nails are painful walking and wearing shoes.  This patient presents for at risk foot care today.  General Appearance  Alert, conversant and in no acute stress.  Vascular  Dorsalis pedis and posterior tibial  pulses are palpable  bilaterally.  Capillary return is within normal limits  bilaterally. Temperature is within normal limits  bilaterally.  Neurologic  Senn-Weinstein monofilament wire test within normal limits  bilaterally. Muscle power within normal limits bilaterally.  Nails Thick disfigured discolored nails with subungual debris  from hallux to fifth toes bilaterally. No evidence of bacterial infection or drainage bilaterally.  Orthopedic  No limitations of motion  feet .  No crepitus or effusions noted.  No bony pathology or digital deformities noted.  Skin  normotropic skin with no porokeratosis noted bilaterally.  No signs of infections or ulcers noted.   Asymptomatic heel callus.  Onychomycosis  Pain in right toes  Pain in left toes  Consent was obtained for treatment procedures.   Mechanical debridement of nails 1-5  bilaterally performed with a nail nipper.  Filed with dremel without incident.    Return office visit    3 months                  Told patient to return for periodic foot care and evaluation due to potential at risk complications.   Helane Gunther DPM

## 2024-09-11 ENCOUNTER — Other Ambulatory Visit (HOSPITAL_COMMUNITY): Payer: Self-pay

## 2024-09-13 ENCOUNTER — Ambulatory Visit: Admitting: Vascular Surgery

## 2024-09-16 ENCOUNTER — Encounter: Payer: Self-pay | Admitting: Surgery

## 2024-09-16 ENCOUNTER — Ambulatory Visit: Attending: Surgery | Admitting: Surgery

## 2024-09-16 VITALS — BP 133/70 | HR 63 | Temp 97.8°F | Ht 71.0 in | Wt 205.0 lb

## 2024-09-16 DIAGNOSIS — I6523 Occlusion and stenosis of bilateral carotid arteries: Secondary | ICD-10-CM

## 2024-09-16 NOTE — Progress Notes (Signed)
 [facility]ist of [facility]  Patient name: Brandon [name] MRN: [id number] DOB: 1946-10-08 Sex: male   REQUESTING PROVIDER:    Anthony Hint   REASON FOR CONSULT:    Carotid  HISTORY OF PRESENT ILLNESS:   Brandon [name] is a 78 y.o. male, who is referred for carotid artery evaluation.  The patient has a history of right carotid stenting in 2004 by Dr. Dolphus for stroke.  Patient currently has issues with short-term memory but not long-term memory.  A recent carotid bruit was auscultated and is sent for an ultrasound.  He denies any new neurologic symptoms such as numbness or weakness in either extremity, slurred speech, or amaurosis fugax.  Patient has a history of coronary artery disease.  He also suffers from congestive heart failure.  He has diabetes.  He is a former smoker with COPD.  He is medically managed for hypertension and diabetes.  He takes a statin for hypercholesterolemia.  He is on Coumadin  with history of valve repair  PAST MEDICAL HISTORY    Past Medical History:  Diagnosis Date   Anemia 07/13/2017   Anxiety    Aortic insufficiency    Aortic stenosis, moderate 07/13/2017   Arthritis    back    Carotid stenosis    Right carotid stent (widely patent) 40 - 59% left plaque 11/13   CHF (congestive heart failure) (HCC)    Chronic kidney disease    COPD (chronic obstructive pulmonary disease) (HCC)    Coronary artery disease involving native coronary artery of native heart without angina pectoris    Depression    Diabetes (HCC)    Dyslipidemia    GERD (gastroesophageal reflux disease)    Heart murmur    Hemiplegia affecting unspecified side, late effect of cerebrovascular disease    resolved- from L side    Hypertension    Jaundice    resolved following ERCP & Cholecystectomy   Mild emphysema (HCC)    Mitral regurgitation    Mitral valve insufficiency and aortic valve insufficiency    Myocardial  infarction (HCC)    Paroxysmal atrial fibrillation (HCC)    Pre-diabetes    per spouse   S/P aortic valve replacement with bioprosthetic valve 11/16/2017   23 mm Edwards Magna Ease stented bovine pericardial tissue valve   S/P CABG x 1 11/16/2017   SVG to PDA with [facility] via right thigh   S/P Maze operation for atrial fibrillation 11/16/2017   Left side lesion set using bipolar radiofrequency and cryothermy ablation with clipping of LA appendage   S/P mitral valve replacement with bioprosthetic valve 11/16/2017   27 mm [facility] Mitral stented bovine pericardial tissue valve   Sleep apnea    does not wear CPAP   Sleep concern    resulted in surgery- after + sleep test. Pt. doesn't have a problem any longer.    Stroke (HCC) 03/11/2003   stent placed on the 31, 3, 2004, L side    Wears glasses    Wears hearing aid in both ears    Wears partial dentures      FAMILY HISTORY   Family History  Problem Relation Age of Onset   Stroke Father        No details   Angina Mother    Kidney cancer Brother     SOCIAL HISTORY:   Social History   Socioeconomic History   Marital status: Married    Spouse name: Not on file  Number of children: 3   Years of education: Not on file   Highest education level: Not on file  Occupational History    Employer: RETIRED  Tobacco Use   Smoking status: Former    Current packs/day: 0.00    Average packs/day: 0.5 packs/day for 57.0 years (28.5 ttl pk-yrs)    Types: Cigarettes    Start date: 78    Quit date: 2018    Years since quitting: 7.7   Smokeless tobacco: Never   Tobacco comments:    Former smoker 01/13/22  Vaping Use   Vaping status: Former   Quit date: 07/13/2017  Substance and Sexual Activity   Alcohol use: Yes    Alcohol/week: 14.0 standard drinks of alcohol    Types: 14 Standard drinks or equivalent per week    Comment: occasional glass of wine   Drug use: No   Sexual activity: Yes  Other Topics Concern   Not on file   Social History Narrative   Two living children.  Lives with wife.     Social Drivers of Corporate investment banker Strain: Not on file  Food Insecurity: No Food Insecurity (11/09/2022)   Hunger Vital Sign    Worried About Running Out of Food in the Last Year: Never true    Ran Out of Food in the Last Year: Never true  Transportation Needs: No Transportation Needs (11/09/2022)   PRAPARE - Administrator, Civil Service (Medical): No    Lack of Transportation (Non-Medical): No  Recent Concern: Transportation Needs - Unmet Transportation Needs (11/09/2022)   PRAPARE - Administrator, Civil Service (Medical): Yes    Lack of Transportation (Non-Medical): Yes  Physical Activity: Not on file  Stress: Not on file  Social Connections: Not on file  Intimate Partner Violence: Not on file    ALLERGIES:    Allergies  Allergen Reactions   Amlodipine  Swelling   Bee Venom Anaphylaxis and Swelling   Bacitracin-Polymyxin B Other (See Comments)    Redness Other reaction(s): rash    CURRENT MEDICATIONS:    Current Outpatient Medications  Medication Sig Dispense Refill   Accu-Chek Softclix Lancets lancets Use as directed to check blood sugar daily and as needed 100 each 3   acetaminophen  (TYLENOL ) 500 MG tablet Take 1,000 mg by mouth every 6 (six) hours as needed (pain.).     amiodarone  (PACERONE ) 200 MG tablet Take 0.5 tablets (100 mg total) by mouth daily. 45 tablet 3   amoxicillin  (AMOXIL ) 500 MG capsule Take 4 capsules (2,000 mg total) by mouth as needed. 4 capsule 1   ascorbic acid (VITAMIN C) 500 MG tablet Take 500 mg by mouth daily.     Blood Glucose Monitoring Suppl (ACCU-CHEK GUIDE) w/Device KIT check your sugar once daily and as needed as directed 1 kit 0   budesonide -formoterol  (SYMBICORT ) 160-4.5 MCG/ACT inhaler Inhale 2 puffs into the lungs 2 times daily at 12 noon and 4 pm. 10.2 g 12   calcium  carbonate (TUMS - DOSED IN MG ELEMENTAL CALCIUM ) 500 MG  chewable tablet Chew 1 tablet by mouth daily as needed for indigestion or heartburn.     Cholecalciferol (VITAMIN D3) 50 MCG (2000 UT) capsule Take 2,000 Units by mouth daily.     citalopram  (CELEXA ) 20 MG tablet Take 1 tablet (20 mg total) by mouth at bedtime. 30 tablet 5   docusate sodium  (COLACE) 100 MG capsule Take 1 capsule (100 mg total) by mouth 3 (three)  times daily as needed. 20 capsule 0   empagliflozin  (JARDIANCE ) 10 MG TABS tablet TAKE 1 TABLET BY MOUTH ONCE A DAY BEFORE BREAKFAST 30 tablet 11   furosemide  (LASIX ) 20 MG tablet Take 1 tablet (20 mg total) by mouth every other day. 45 tablet 3   glucose blood (ACCU-CHEK GUIDE) test strip Use as directed to check blood sugar once daily and as needed 100 strip 3   loratadine (CLARITIN) 10 MG tablet Take 10 mg by mouth daily as needed for allergies or rhinitis.     magnesium  30 MG tablet Take 30 mg by mouth 2 (two) times daily.     methocarbamol  (ROBAXIN ) 500 MG tablet Take 1 tablet (500 mg total) by mouth every 8 (eight) hours as needed for muscle spasms. 30 tablet 0   pantoprazole  (PROTONIX ) 40 MG tablet Take 1 tablet (40 mg total) by mouth daily. 90 tablet 3   Pseudoeph-Doxylamine-DM-APAP (NYQUIL PO) Take 30 mLs by mouth daily as needed (Cold Symptons).     rosuvastatin  (CRESTOR ) 10 MG tablet TAKE 1 TABLET EVERY DAY 90 tablet 3   sacubitril -valsartan  (ENTRESTO ) 24-26 MG Take 1 tablet by mouth 2 (two) times daily. 180 tablet 2   spironolactone  (ALDACTONE ) 25 MG tablet TAKE 1 TABLET EVERY DAY 90 tablet 3   tirzepatide  (MOUNJARO ) 15 MG/0.5ML Pen Inject 15 mg into the skin once a week. 2 mL 11   traZODone  (DESYREL ) 50 MG tablet Take 100 mg by mouth at bedtime.     triamcinolone  ointment (KENALOG ) 0.1 % Apply 1 Application topically 2 (two) times daily. 60 g 0   warfarin (COUMADIN ) 2.5 MG tablet TAKE 1 TO 1 AND 1/2 TABLETS DAILY OR AS DIRECTED BY THE COUMADIN  CLINIC (Patient taking differently: TAKE1 tablet on Monday Wednesday Thursday Friday  and Saturday 1 and1/2 TABLETS on Sundays and Tuesday  OR AS DIRECTED BY THE COUMADIN  CLINIC) 115 tablet 3   Zinc 50 MG TABS Take 50 mg by mouth daily.     No current facility-administered medications for this visit.    REVIEW OF SYSTEMS:   [X]  denotes positive finding, [ ]  denotes negative finding Cardiac  Comments:  Chest pain or chest pressure:    Shortness of breath upon exertion:    Short of breath when lying flat:    Irregular heart rhythm:        Vascular    Pain in calf, thigh, or hip brought on by ambulation:    Pain in feet at night that wakes you up from your sleep:     Blood clot in your veins:    Leg swelling:         Pulmonary    Oxygen at home:    Productive cough:     Wheezing:         Neurologic    Sudden weakness in arms or legs:     Sudden numbness in arms or legs:     Sudden onset of difficulty speaking or slurred speech:    Temporary loss of vision in one eye:     Problems with dizziness:         Gastrointestinal    Blood in stool:      Vomited blood:         Genitourinary    Burning when urinating:     Blood in urine:        Psychiatric    Major depression:         Hematologic  Bleeding problems:    Problems with blood clotting too easily:        Skin    Rashes or ulcers:        Constitutional    Fever or chills:     PHYSICAL EXAM:   Vitals:   09/16/24 1043 09/16/24 1045  BP: 138/82 133/70  Pulse: 63   Temp: 97.8 F (36.6 C)   SpO2: 96%   Weight: 205 lb (93 kg)   Height: 5' 11 (1.803 m)     GENERAL: The patient is a well-nourished male, in no acute distress. The vital signs are documented above. CARDIAC: There is a regular rate and rhythm.  VASCULAR: Bilateral carotid bruits PULMONARY: Nonlabored respirations ABDOMEN: Soft and non-tender  MUSCULOSKELETAL: There are no major deformities or cyanosis. NEUROLOGIC: No focal weakness or paresthesias are detected. SKIN: There are no ulcers or rashes noted. PSYCHIATRIC: The  patient has a normal affect.  STUDIES:   I have reviewed the following: 1. Patent right internal carotid artery stent without evidence of in stent stenosis. 2. Mild (1-49%) stenosis proximal left internal carotid artery secondary to heterogenous atherosclerotic plaque. 3. Extensive atherosclerotic plaque bilaterally in the common carotid arteries. 4. Greater than 60% stenosis at the origin of the right external carotid artery. This may account for the clinical finding of carotid bruit.    ASSESSMENT and PLAN   Carotid: The patient has a history of carotid stenting in 2004 for a stroke.  He recently underwent carotid duplex for bruit which identified a patent carotid stent on the right and mild left-sided stenosis.  He is also found to have extensive common carotid plaque.  I reviewed all of his images and it does appear that his common carotid plaque may be hemodynamically significant.  Therefore in order to better evaluate this I have recommended CT scan.  He will get this performed and return within the month.   Malvina Serene CLORE, MD, FACS [facility] [phone] Pager [phone]

## 2024-09-18 ENCOUNTER — Other Ambulatory Visit: Payer: Self-pay

## 2024-09-18 DIAGNOSIS — I6523 Occlusion and stenosis of bilateral carotid arteries: Secondary | ICD-10-CM

## 2024-09-23 ENCOUNTER — Other Ambulatory Visit (HOSPITAL_COMMUNITY): Payer: Self-pay

## 2024-09-24 DIAGNOSIS — Z23 Encounter for immunization: Secondary | ICD-10-CM | POA: Diagnosis not present

## 2024-09-26 NOTE — Progress Notes (Signed)
  Electrophysiology Office Note:   ID:  Brandon [name], Brandon [name] 1946/06/13, MRN [id number]  Primary Cardiologist: None Electrophysiologist: [name], MD      History of Present Illness:   Brandon [name] is a 78 y.o. male with h/o HFrecEF, CVA, CAD, CHB s/p MDT PPM, AF, AFL, and Atrial tachycardia  seen today for routine electrophysiology followup.   Since last being seen in our clinic the patient reports doing OK. Getting around well. Able to do his daily activities without restriction. Overall, he denies chest pain, palpitations, dyspnea, PND, orthopnea, nausea, vomiting, dizziness, syncope, edema, weight gain, or early satiety.   Review of systems complete and found to be negative unless listed in HPI.   EP Information / Studies Reviewed:    EKG is ordered today. Personal review as below.  EKG Interpretation Date/Time:  Friday September 27 2024 11:47:22 EDT Ventricular Rate:  60 PR Interval:  344 QRS Duration:  132 QT Interval:  524 QTC Calculation: 524 R Axis:   7  Text Interpretation: Sinus rhythm with 1st degree A-V block with occasional Premature ventricular complexes Non-specific intra-ventricular conduction block Cannot rule out Anteroseptal infarct (cited on or before 18-Apr-2023) When compared with ECG of 05-Feb-2024 11:13, Premature ventricular complexes are now Present Confirmed by Lesia Sharper [phone]) on 09/27/2024 1:55:33 PM    PPM Interrogation-  Prior report reviewed. No testing performed today.   Arrhythmia/Device History Medtronic Dual Chamber PPM implanted 2018 for CHB   Physical Exam:   VS:  BP 129/76   Pulse 60   Ht 5' 11 (1.803 m)   Wt 207 lb 14.4 oz (94.3 kg)   SpO2 97%   BMI 29.00 kg/m    Wt Readings from Last 3 Encounters:  09/27/24 207 lb 14.4 oz (94.3 kg)  09/16/24 205 lb (93 kg)  09/05/24 207 lb (93.9 kg)     GEN: No acute distress  NECK: No JVD; No carotid bruits CARDIAC: Regular rate and rhythm, no murmurs, rubs,  gallops RESPIRATORY:  Clear to auscultation without rales, wheezing or rhonchi  ABDOMEN: Soft, non-tender, non-distended EXTREMITIES:  No edema; No deformity   ASSESSMENT AND PLAN:    CHB s/p Medtronic PPM  Normal PPM function by last remote No check today.   Persistent atrial fibrillation Atypical atrial flutter S/p repeat ablation 12/16/2021 S/p surgical MAZE and LAA clipping Continue amidoarone 100 mg daily.  H/o AT at CL of ~539ms.  Historically have been able to pace out at 280-360ms Surveillance lab today  VHD HFrecEF Stable Mitral and AVR on prior imaging Echo 04/2023 LVEF 50-55% Scheduled for updated echo 10/10/2024   Disposition:   Follow up with EP Team in 6 months  Signed, Sharper Prentice Lesia, PA-C

## 2024-09-27 ENCOUNTER — Encounter: Payer: Self-pay | Admitting: Student

## 2024-09-27 ENCOUNTER — Ambulatory Visit (HOSPITAL_COMMUNITY)
Admission: RE | Admit: 2024-09-27 | Discharge: 2024-09-27 | Disposition: A | Source: Ambulatory Visit | Attending: Surgery | Admitting: Surgery

## 2024-09-27 ENCOUNTER — Ambulatory Visit: Attending: Student | Admitting: Student

## 2024-09-27 VITALS — BP 129/76 | HR 60 | Ht 71.0 in | Wt 207.9 lb

## 2024-09-27 DIAGNOSIS — Z953 Presence of xenogenic heart valve: Secondary | ICD-10-CM | POA: Diagnosis not present

## 2024-09-27 DIAGNOSIS — I700 Atherosclerosis of aorta: Secondary | ICD-10-CM | POA: Diagnosis not present

## 2024-09-27 DIAGNOSIS — I6523 Occlusion and stenosis of bilateral carotid arteries: Secondary | ICD-10-CM | POA: Diagnosis not present

## 2024-09-27 DIAGNOSIS — I672 Cerebral atherosclerosis: Secondary | ICD-10-CM | POA: Diagnosis not present

## 2024-09-27 DIAGNOSIS — D649 Anemia, unspecified: Secondary | ICD-10-CM | POA: Diagnosis not present

## 2024-09-27 DIAGNOSIS — I480 Paroxysmal atrial fibrillation: Secondary | ICD-10-CM

## 2024-09-27 DIAGNOSIS — I708 Atherosclerosis of other arteries: Secondary | ICD-10-CM | POA: Insufficient documentation

## 2024-09-27 DIAGNOSIS — I442 Atrioventricular block, complete: Secondary | ICD-10-CM

## 2024-09-27 DIAGNOSIS — Z951 Presence of aortocoronary bypass graft: Secondary | ICD-10-CM | POA: Diagnosis not present

## 2024-09-27 DIAGNOSIS — Z79899 Other long term (current) drug therapy: Secondary | ICD-10-CM | POA: Diagnosis not present

## 2024-09-27 DIAGNOSIS — J320 Chronic maxillary sinusitis: Secondary | ICD-10-CM | POA: Insufficient documentation

## 2024-09-27 DIAGNOSIS — I6503 Occlusion and stenosis of bilateral vertebral arteries: Secondary | ICD-10-CM | POA: Diagnosis not present

## 2024-09-27 LAB — POCT I-STAT CREATININE: Creatinine, Ser: 1.9 mg/dL — ABNORMAL HIGH (ref 0.61–1.24)

## 2024-09-27 MED ORDER — IOHEXOL 350 MG/ML SOLN
75.0000 mL | Freq: Once | INTRAVENOUS | Status: AC | PRN
  Administered 2024-09-27: 75 mL via INTRAVENOUS

## 2024-09-27 NOTE — Patient Instructions (Signed)
 Medication Instructions:  Your physician recommends that you continue on your current medications as directed. Please refer to the Current Medication list given to you today.  *If you need a refill on your cardiac medications before your next appointment, please call your pharmacy*  Lab Work: CMET, TSH, FreeT4-TODAY If you have labs (blood work) drawn today and your tests are completely normal, you will receive your results only by: MyChart Message (if you have MyChart) OR A paper copy in the mail If you have any lab test that is abnormal or we need to change your treatment, we will call you to review the results.  Follow-Up: At [facility], you and your health needs are our priority.  As part of our continuing mission to provide you with exceptional heart care, our providers are all part of one team.  This team includes your primary Cardiologist (physician) and Advanced Practice Providers or APPs (Physician Assistants and Nurse Practitioners) who all work together to provide you with the care you need, when you need it.  Your next appointment:   6 month(s)  Provider:   Bambi Lever "Michaelle Adolphus, PA-C

## 2024-09-28 LAB — COMPREHENSIVE METABOLIC PANEL WITH GFR
ALT: 8 IU/L (ref 0–44)
AST: 13 IU/L (ref 0–40)
Albumin: 4.1 g/dL (ref 3.8–4.8)
Alkaline Phosphatase: 34 IU/L — ABNORMAL LOW (ref 47–123)
BUN/Creatinine Ratio: 12 (ref 10–24)
BUN: 19 mg/dL (ref 8–27)
Bilirubin Total: 0.3 mg/dL (ref 0.0–1.2)
CO2: 22 mmol/L (ref 20–29)
Calcium: 9.2 mg/dL (ref 8.6–10.2)
Chloride: 100 mmol/L (ref 96–106)
Creatinine, Ser: 1.64 mg/dL — ABNORMAL HIGH (ref 0.76–1.27)
Globulin, Total: 2.7 g/dL (ref 1.5–4.5)
Glucose: 83 mg/dL (ref 70–99)
Potassium: 4.2 mmol/L (ref 3.5–5.2)
Sodium: 137 mmol/L (ref 134–144)
Total Protein: 6.8 g/dL (ref 6.0–8.5)
eGFR: 43 mL/min/1.73 — ABNORMAL LOW (ref >59)

## 2024-09-28 LAB — TSH: TSH: 2.81 u[IU]/mL (ref 0.450–4.500)

## 2024-09-28 LAB — T4, FREE: Free T4: 1.44 ng/dL (ref 0.82–1.77)

## 2024-09-29 ENCOUNTER — Ambulatory Visit: Payer: Self-pay | Admitting: Student

## 2024-10-08 ENCOUNTER — Ambulatory Visit: Attending: Internal Medicine | Admitting: Pharmacist

## 2024-10-08 DIAGNOSIS — Z9889 Other specified postprocedural states: Secondary | ICD-10-CM

## 2024-10-08 DIAGNOSIS — Z8679 Personal history of other diseases of the circulatory system: Secondary | ICD-10-CM | POA: Diagnosis not present

## 2024-10-08 DIAGNOSIS — I480 Paroxysmal atrial fibrillation: Secondary | ICD-10-CM

## 2024-10-08 DIAGNOSIS — Z5181 Encounter for therapeutic drug level monitoring: Secondary | ICD-10-CM | POA: Diagnosis not present

## 2024-10-08 DIAGNOSIS — Z953 Presence of xenogenic heart valve: Secondary | ICD-10-CM | POA: Diagnosis not present

## 2024-10-08 DIAGNOSIS — Z951 Presence of aortocoronary bypass graft: Secondary | ICD-10-CM

## 2024-10-08 LAB — POCT INR: INR: 1.8 — AB (ref 2.0–3.0)

## 2024-10-08 NOTE — Patient Instructions (Signed)
 Description   INR-1.8; Take 2 tablets today and then continue taking 1 tablet daily except 1/2 tablet on Wednesdays.  Stay consistent with greens (2-3 times per week).  Recheck INR in 4 weeks.  Coumadin  Clinic [phone]. HAVE DENTAL OFFICE FAX CLEARANCE FORM TO CARDIOLOGY OFFICE.

## 2024-10-08 NOTE — Progress Notes (Signed)
 Description   INR-1.8; Take 2 tablets today and then continue taking 1 tablet daily except 1/2 tablet on Wednesdays.  Stay consistent with greens (2-3 times per week).  Recheck INR in 4 weeks.  Coumadin  Clinic [phone]. HAVE DENTAL OFFICE FAX CLEARANCE FORM TO CARDIOLOGY OFFICE.

## 2024-10-10 ENCOUNTER — Ambulatory Visit (HOSPITAL_BASED_OUTPATIENT_CLINIC_OR_DEPARTMENT_OTHER)
Admission: RE | Admit: 2024-10-10 | Discharge: 2024-10-10 | Disposition: A | Discharge status: Home / Self Care | Source: Ambulatory Visit | Attending: Cardiology | Admitting: Cardiology

## 2024-10-10 ENCOUNTER — Encounter (HOSPITAL_COMMUNITY): Payer: Self-pay | Admitting: Cardiology

## 2024-10-10 ENCOUNTER — Ambulatory Visit (HOSPITAL_COMMUNITY)
Admission: RE | Admit: 2024-10-10 | Discharge: 2024-10-10 | Disposition: A | Discharge status: Home / Self Care | Source: Ambulatory Visit | Attending: Cardiology | Admitting: Cardiology

## 2024-10-10 VITALS — BP 120/68 | HR 58 | Ht 71.0 in | Wt 204.6 lb

## 2024-10-10 DIAGNOSIS — I77810 Thoracic aortic ectasia: Secondary | ICD-10-CM | POA: Insufficient documentation

## 2024-10-10 DIAGNOSIS — Z7901 Long term (current) use of anticoagulants: Secondary | ICD-10-CM | POA: Diagnosis not present

## 2024-10-10 DIAGNOSIS — J449 Chronic obstructive pulmonary disease, unspecified: Secondary | ICD-10-CM | POA: Diagnosis not present

## 2024-10-10 DIAGNOSIS — Z950 Presence of cardiac pacemaker: Secondary | ICD-10-CM | POA: Insufficient documentation

## 2024-10-10 DIAGNOSIS — G4733 Obstructive sleep apnea (adult) (pediatric): Secondary | ICD-10-CM | POA: Insufficient documentation

## 2024-10-10 DIAGNOSIS — E785 Hyperlipidemia, unspecified: Secondary | ICD-10-CM | POA: Diagnosis not present

## 2024-10-10 DIAGNOSIS — I440 Atrioventricular block, first degree: Secondary | ICD-10-CM | POA: Diagnosis not present

## 2024-10-10 DIAGNOSIS — I442 Atrioventricular block, complete: Secondary | ICD-10-CM | POA: Insufficient documentation

## 2024-10-10 DIAGNOSIS — R001 Bradycardia, unspecified: Secondary | ICD-10-CM | POA: Diagnosis not present

## 2024-10-10 DIAGNOSIS — E669 Obesity, unspecified: Secondary | ICD-10-CM | POA: Diagnosis not present

## 2024-10-10 DIAGNOSIS — I2510 Atherosclerotic heart disease of native coronary artery without angina pectoris: Secondary | ICD-10-CM | POA: Diagnosis not present

## 2024-10-10 DIAGNOSIS — J849 Interstitial pulmonary disease, unspecified: Secondary | ICD-10-CM | POA: Diagnosis not present

## 2024-10-10 DIAGNOSIS — N183 Chronic kidney disease, stage 3 unspecified: Secondary | ICD-10-CM | POA: Insufficient documentation

## 2024-10-10 DIAGNOSIS — Z6828 Body mass index (BMI) 28.0-28.9, adult: Secondary | ICD-10-CM | POA: Insufficient documentation

## 2024-10-10 DIAGNOSIS — Z8673 Personal history of transient ischemic attack (TIA), and cerebral infarction without residual deficits: Secondary | ICD-10-CM | POA: Diagnosis not present

## 2024-10-10 DIAGNOSIS — Z79899 Other long term (current) drug therapy: Secondary | ICD-10-CM | POA: Insufficient documentation

## 2024-10-10 DIAGNOSIS — I447 Left bundle-branch block, unspecified: Secondary | ICD-10-CM | POA: Diagnosis not present

## 2024-10-10 DIAGNOSIS — D509 Iron deficiency anemia, unspecified: Secondary | ICD-10-CM | POA: Diagnosis not present

## 2024-10-10 DIAGNOSIS — I5022 Chronic systolic (congestive) heart failure: Secondary | ICD-10-CM | POA: Diagnosis not present

## 2024-10-10 DIAGNOSIS — Z953 Presence of xenogenic heart valve: Secondary | ICD-10-CM | POA: Diagnosis not present

## 2024-10-10 DIAGNOSIS — Z87891 Personal history of nicotine dependence: Secondary | ICD-10-CM | POA: Diagnosis not present

## 2024-10-10 DIAGNOSIS — I130 Hypertensive heart and chronic kidney disease with heart failure and stage 1 through stage 4 chronic kidney disease, or unspecified chronic kidney disease: Secondary | ICD-10-CM | POA: Insufficient documentation

## 2024-10-10 DIAGNOSIS — I493 Ventricular premature depolarization: Secondary | ICD-10-CM | POA: Diagnosis not present

## 2024-10-10 DIAGNOSIS — I480 Paroxysmal atrial fibrillation: Secondary | ICD-10-CM | POA: Diagnosis not present

## 2024-10-10 DIAGNOSIS — I080 Rheumatic disorders of both mitral and aortic valves: Secondary | ICD-10-CM | POA: Diagnosis not present

## 2024-10-10 LAB — ECHOCARDIOGRAM COMPLETE
AR max vel: 1.59 cm2
AV Area VTI: 1.38 cm2
AV Area mean vel: 1.68 cm2
AV Mean grad: 11 mmHg
AV Peak grad: 22.8 mmHg
Ao pk vel: 2.39 m/s
Area-P 1/2: 2.25 cm2
MV VTI: 1.27 cm2
S' Lateral: 4.2 cm

## 2024-10-10 NOTE — Progress Notes (Signed)
 [facility] Note   PCP: [facility] Nephrology: Dr. Prescilla Cardiology: Dr. Shlomo HF Cardiology: Dr. Rolan  Chief complaint: CHF  78 y.o.with history of CVA, paroxysmal atrial fibrillation, valvular heart disease, and chronic diastolic CHF presents for evaluation of CHF and valvular heart disease.    He had had episodes of dyspnea and initial workup led to a TEE in 5/18 showing normal EF with moderate AS and moderate MR.  He continued to have episodic dyspnea and ended up admitted in 8/18 with shortness of breath and chest pressure.  This led to a long, complicated hospitalization.  He was noted to be volume overloaded with acute diastolic CHF and was also noted to have symptomatic runs of atrial fibrillation with RVR.  Amiodarone  was started to control the atrial fibrillation.  He developed respiratory distress => Bipap => intubated.  He became hypotensive, requiring pressors.  He developed AKI.  PNA was noted and he received broad spectrum abx.  He had a prolonged intubation but was eventually extubated.  Given deconditioning from long hospitalization, he went to CIR for a couple of weeks.  LHC in 9/18 showed occluded PDA with left>right collaterals.  TEE in 9/18 showed EF 50%, severe MR (possibly infarct-related with restricted posterior leaflet, and moderate AS + moderate-severe AI (possibly rheumatic).   In 12/18, he had cardiac surgery with bioprosthetic aortic and mitral valves placed.  He had SVG-PDA, Maze, and LA appendage clipping.  Post-op course was complicated by CHB requiring Medtronic dual chamber PPM (His bundle lead placed but captured right bundle so induced LBBB).   Echo 01/02/18 LVEF 30-35%, bioprosthetic MV and AoV function normally, RV mildly dilated with normal systolic function.    He was noted to be in atrial flutter with mild RVR and had TEE-guided DCCV on 04/11/18. TEE showed EF 25-30%, normal bioprosthetic aortic and  mitral valves.  Echo in 6/19 showed EF 30-35%, normal bioprosthetic aortic and mitral valves.  Echo 12/19 with EF up to 50-55%, normal bioprosthetic mitral and aortic valves, normal RV.   In 2020, he went back into atrial fibrillation.  Amiodarone  was started, and he returned to NSR.   Echo in 11/21 showed EF 50-55%, mild LVH, mildly dilated RV with normal function, bioprosthetic mitral valve with mean gradient 5 mmHg and no MR, bioprosthetic aortic valve with mean gradient 12 and no AI.  PFTs in 1/22 were restrictive.  High resolution CT chest was done in 2/22, concerning for ILD, usual interstitial pneumonitis.  He saw pulmonary, ESR was mildly elevated.  We decided to try him off amiodarone  given concern for possible amiodarone -induced lung toxicity.  He then developed atrial fibrillation off amiodarone , was quite symptomatic.  He underwent TEE-guided DCCV back to NSR in 7/22 and amiodarone  was restarted.  TEE showed EF 50-55%, mid septal HK, RV normal, bioprosthetic aortic and mitral valves appeared normal.   EP tried decreasing amiodarone  to 100 mg daily.  However, patient then developed runs of atrial tachycardia (had to be paced out).  Amiodarone  was increased back to 200 mg daily.   He had HRCT chest in 1/24, no clear ILD.  Saw Dr Theophilus with pulmonary, though it was ok to continue amiodarone .   Echo 5/24 EF 50-55% with normal RV, PASP 33 mmHg, s/p bioprosthetic MVR (mean gradient 4 mmHg, no MR), s/p bioprosthetic AVR (mean gradient 9, no AI).   HRCT chest in 9/25 showed minimal interstitial lung disease.  He saw pulmonary, think ok  to continue amiodarone .   Echo was done today and reviewed, EF 50-55%, basal inferior akinesis, mild RV dysfunction, bioprosthetic aortic valve mean gradient 11, bioprosthetic mitral valve mean gradient 3 mmHg.   Today he returns for HF follow up with his wife. He is feeling well overall.  He has lost 26 lbs since last appointment; he is on tirzepatide .  No  dyspnea with usual activities.  No orthopnea/PND.  No chest pain.  He fatigues easily.   ECG (personally reviewed): NSR, 1st degree AVB 338 msec, LBBB 132 msec  Labs (1/24): K 4.8, creatinine 2.14 Labs (9/24): K 4.6, creatinine 2.11, LDL 65, A1C 6.0 Labs (2/25): normal TSH, normal LFTs, LDL 44 Labs (3/25): K 4.5, creatinine 2.39 Labs (5/25): creatinine 2.58 Labs (9/25): LDL 34 Labs (10/25): TSH normal, LFTs normal, K 4.2, creatinine 1.64  PMH: 1. CVA: Right MCA, 2004.  Minimal residual problems.  2. HTN - Peripheral edema with amlodipine .  3. Hyperlipidemia 4. Left rotator cuff surgery 6/18 5. Carotid stenosis: s/p right carotid stent.  - Carotid dopplers (9/25): Patent RICA stent.  6. GERD 7. H/o CCY 8. Anemia: FOBT negative.  9. Gout  10. Atrial fibrillation: Paroxysmal. Maze in 12/18 with LAA clipping.  - Recurrent atrial fibrillation 2020 => amiodarone .  - Recurrent atrial fibrillation off amiodarone  in 7/22, DCCV to NSR and amiodarone  restarted.  11. Valvular heart disease: TEE (5/18) with EF 55-60%, moderate AS mean 33 mmHg, moderate MR, normal RV size and systolic function.  - Echo (8/18): EF 55-60%, moderate LVH, moderate AS with mean gradient 33 mmHg, moderate AI, moderate to severe MR.  - TEE (9/18): EF 50%, mild LV dilation, suspect severe MR with posterior leaflet restricted (?infarct-related MR), ERO 0.4 cm^2, moderate AS/moderate-severe AI (possibly rheumatic).   - In 12/18, he had cardiac surgery with bioprosthetic aortic and mitral valves placed.  He had SVG-PDA, Maze, and LA appendage clipping. - Echo (1/19): LVEF 30-35%, bioprosthetic MV and AoV function normally, RV mildly dilated with normal systolic function.  - TEE (5/19) with EF 25-30%, normal bioprosthetic aortic valve, normal bioprosthetic mitral valve. - Echo (6/19): EF 30-35%, mild LV dilation with diffuse hypokinesis, normal RV size with mildly decreased systolic function, normal-appearing bioprosthetic  mitral and aortic valves.   - Echo (12/19): EF 50-55%, moderate LVH, bioprosthetic aortic valve with mean gradient 10 mmHg, normally-functioning bioprosthetic mitral valve.  - Echo (11/21): EF 50-55%, mild LVH, mildly dilated RV with normal function, bioprosthetic mitral valve with mean gradient 5 mmHg and no MR, bioprosthetic aortic valve with mean gradient 12 and no AI.   - TEE (7/22): EF 50-55%, septal HK, normal RV size/systolic function, LA appendage clipped, bioprosthetic aortic and mitral valves appeared normal.  - Echo (5/24): EF 50-55% with normal RV, PASP 33 mmHg, s/p bioprosthetic MVR (mean gradient 4 mmHg, no MR), s/p bioprosthetic AVR (mean gradient 9, no AI).  - Echo (10/25): EF 50-55%, basal inferior akinesis, mild RV dysfunction, bioprosthetic aortic valve mean gradient 11, bioprosthetic mitral valve mean gradient 3 mmHg.  12. Chronic systolic CHF 13. Deafness 14. CKD: stage 3.  15. 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  17. COPD: Mild obstruction on 9/18 PFTs.  - PFTs (10/20): Mild obstruction, decreased DLCO.  18. Post-op CHB in 12/18 with Medtronic dual chamber PPM.  19. Chronic systolic CHF: Echo (1/19) with EF down  to 30-35% post-op.  - TEE (5/19) with EF 25-30%, normal bioprosthetic aortic valve, normal bioprosthetic mitral valve.  - Echo (12/19): EF 50-55%, moderate LVH, bioprosthetic aortic valve with mean gradient 10 mmHg, normally-functioning bioprosthetic mitral valve.  20. Atrial flutter: DCCV in 5/19.  21. OSA: Not using CPAP.  22. Interstitial lung disease: PFTs in 1/22 concerning for restriction.  High resolution chest CT in 2/22 with possible UIP.  - HRCT chest (1/24): Not suggestive of ILD per Dr. Theophilus.  - HRCT chest (10/25): Mild interstitial lung disease.  23. Atrial tachycardia: Pace-terminated.   SH: Quit smoking  8/18.  Married, 2 children, retired.    Family History  Problem Relation Age of Onset   Stroke Father        No details   Angina Mother    Kidney cancer Brother    Review of systems complete and found to be negative unless listed in HPI.    Current Outpatient Medications  Medication Sig Dispense Refill   Accu-Chek Softclix Lancets lancets Use as directed to check blood sugar daily and as needed 100 each 3   acetaminophen  (TYLENOL ) 500 MG tablet Take 1,000 mg by mouth every 6 (six) hours as needed (pain.).     amiodarone  (PACERONE ) 200 MG tablet Take 0.5 tablets (100 mg total) by mouth daily. 45 tablet 3   amoxicillin  (AMOXIL ) 500 MG capsule Take 4 capsules (2,000 mg total) by mouth as needed. 4 capsule 1   ascorbic acid (VITAMIN C) 500 MG tablet Take 500 mg by mouth daily.     Blood Glucose Monitoring Suppl (ACCU-CHEK GUIDE) w/Device KIT check your sugar once daily and as needed as directed 1 kit 0   budesonide -formoterol  (SYMBICORT ) 160-4.5 MCG/ACT inhaler Inhale 2 puffs into the lungs 2 times daily at 12 noon and 4 pm. 10.2 g 12   calcium  carbonate (TUMS - DOSED IN MG ELEMENTAL CALCIUM ) 500 MG chewable tablet Chew 1 tablet by mouth daily as needed for indigestion or heartburn.     Cholecalciferol (VITAMIN D3) 50 MCG (2000 UT) capsule Take 2,000 Units by mouth daily.     citalopram  (CELEXA ) 20 MG tablet Take 1 tablet (20 mg total) by mouth at bedtime. 30 tablet 5   docusate sodium  (COLACE) 100 MG capsule Take 1 capsule (100 mg total) by mouth 3 (three) times daily as needed. 20 capsule 0   empagliflozin  (JARDIANCE ) 10 MG TABS tablet TAKE 1 TABLET BY MOUTH ONCE A DAY BEFORE BREAKFAST 30 tablet 11   furosemide  (LASIX ) 20 MG tablet Take 1 tablet (20 mg total) by mouth every other day. 45 tablet 3   glucose blood (ACCU-CHEK GUIDE) test strip Use as directed to check blood sugar once daily and as needed 100 strip 3   loratadine (CLARITIN) 10 MG tablet Take 10 mg by mouth daily as needed for  allergies or rhinitis.     Magnesium  250 MG CAPS Take 1 tablet by mouth daily at 6 (six) AM.     methocarbamol  (ROBAXIN ) 500 MG tablet Take 1 tablet (500 mg total) by mouth every 8 (eight) hours as needed for muscle spasms. 30 tablet 0   pantoprazole  (PROTONIX ) 40 MG tablet Take 1 tablet (40 mg total) by mouth daily. 90 tablet 3   Pseudoeph-Doxylamine-DM-APAP (NYQUIL PO) Take 30 mLs by mouth daily as needed (Cold Symptons).     rosuvastatin  (CRESTOR ) 10 MG tablet TAKE 1 TABLET EVERY DAY 90 tablet 3   sacubitril -valsartan  (ENTRESTO ) 24-26 MG Take 1 tablet  by mouth 2 (two) times daily. 180 tablet 2   spironolactone  (ALDACTONE ) 25 MG tablet TAKE 1 TABLET EVERY DAY 90 tablet 3   tirzepatide  (MOUNJARO ) 15 MG/0.5ML Pen Inject 15 mg into the skin once a week. 2 mL 11   traZODone  (DESYREL ) 50 MG tablet Take 100 mg by mouth at bedtime.     triamcinolone  ointment (KENALOG ) 0.1 % Apply 1 Application topically 2 (two) times daily. 60 g 0   warfarin (COUMADIN ) 2.5 MG tablet TAKE 1 TO 1 AND 1/2 TABLETS DAILY OR AS DIRECTED BY THE COUMADIN  CLINIC (Patient taking differently: TAKE1 tablet on Monday Wednesday Thursday Friday and Saturday 1 and1/2 TABLETS on Sundays and Tuesday  OR AS DIRECTED BY THE COUMADIN  CLINIC) 115 tablet 3   Zinc 50 MG TABS Take 50 mg by mouth daily.     No current facility-administered medications for this encounter.   BP 120/68   Pulse (!) 58   Ht 5' 11 (1.803 m)   Wt 92.8 kg (204 lb 9.6 oz)   SpO2 99%   BMI 28.54 kg/m   Wt Readings from Last 3 Encounters:  10/10/24 92.8 kg (204 lb 9.6 oz)  09/27/24 94.3 kg (207 lb 14.4 oz)  09/16/24 93 kg (205 lb)   Physical Exam General: NAD Neck: No JVD, no thyromegaly or thyroid  nodule.  Lungs: Clear to auscultation bilaterally with normal respiratory effort. CV: Nondisplaced PMI.  Heart regular S1/S2, no S3/S4, no murmur.  No peripheral edema.  No carotid bruit.  Normal pedal pulses.  Abdomen: Soft, nontender, no hepatosplenomegaly, no  distention.  Skin: Intact without lesions or rashes.  Neurologic: Alert and oriented x 3.  Psych: Normal affect. Extremities: No clubbing or cyanosis.  HEENT: Normal.   Assessment/Plan: 1. Valvular heart disease: TEE in 9/18 showed severe MR, probably infarct-related with restrictive posterior mitral leaflet. There was moderate aortic stenosis and moderate-severe aortic insufficiency, possible rheumatic.  In 12/18, he had bioprosthetic MVR and AVR. Valves stable on today's echo.  - Antibiotic prophylaxis needed with dental work.  2. Chronic systolic => diastolic CHF: In setting of significant valvular disease as above. EF down to 30-35% post-op (1/19 echo), likely reflects true LV systolic function without the volume load from severe MR and moderate-severe AI.  TEE in 5/19 showed EF 25-30%.  Repeat echo in 6/19 with EF 30-35%. Repeat echo in 12/19 showed EF up to 50-55%, echo in 11/21 again showed EF 50-55% with normal RV systolic function.  TEE in 7/22 showed EF 50-55% with normal RV.  Echo today showed EF 50-55%, basal inferior akinesis, mild RV dysfunction, bioprosthetic aortic valve mean gradient 11, bioprosthetic mitral valve mean gradient 3 mmHg.  NYHA class II symptoms, not volume overloaded.  - Continue Entresto  24/26 bid.   - Continue Lasix  20 mg every other day.  - Continue spironolactone  25 mg daily. Recent BMET with lower creatinine.  - Continue Jardiance  10 mg daily.  - With wide PR interval, now off Coreg  to avoid any increase in RV pacing.  3. CKD: Stage 3. Continue Jardiance .  4. Atrial fibrillation/flutter: Paroxysmal. He has had a Maze procedure.  He tends to tolerate atrial arrhythmias poorly.  He had atrial flutter with DCCV in 5/19.  Recurrent atrial fibrillation in 2020.  Stopped amiodarone  and went into atrial fibrillation in 6/22, very symptomatic.  Amiodarone  restarted and he was cardioverted to NSR in 7/22.  NSR today.  - He is on warfarin. INR followed by coumadin  clinic.    -  With markedly wide 1st degree AVB, amio has been decreased to 100 mg daily.  Recent LFTs and TSH were stable.  He will need regular eye exams.  CT chest in past showed possible early ILD, ESR was only mildly elevated.  He saw pulmonary, not definite amiodarone -induced lung toxicity but need to follow closely.  Would prefer him off amiodarone , but he developed atrial tachycardia with RVR when we decreased the dose.  Repeat HRCT chest in 9/25 showed minimal ILD.  5. COPD: Mild obstruction on 10/20 PFTs, decreased DLCO.  He has quit smoking.   - Follows with pulmonary.  6. CVA: Remote, minimal residual. S/p carotid stenting.  - No ASA given warfarin use.  7. Hyperlipidemia: Continue Crestor .  LDL good 9/25.  8. Anemia: Fe deficiency.  9. 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  The artery providing the PDA was occluded with left to right collaterals.  He had SVG-PDA in 12/18. No chest pain.  - Continue statin.      - No ASA given warfarin use.  10. CHB: has Medtronic PPM.  He a-paces a high percentage of the time but there is minimal RV pacing.  - Now off Coreg  as above and on low-dose amiodarone  with long PR.  11. OSA: Severe OSA, he does not want to use CPAP.  12. Obesity: Body mass index is 28.54 kg/m. - He is now on tirzepatide , weight has come down significantly.  13. HTN: BP controlled.  14. Interstitial lung disease: Restrictive PFTs in 1/22.  High resolution CT chest concerning for ILD, possibly usual interstitial pneumonitis.  He saw pulmonary, could not rule out amiodarone  lung toxicity so tried him off amiodarone .  He quickly went back into atrial fibrillation that was tolerated poorly, so amiodarone  restarted.  We again tried to wean amiodarone  but he developed atrial tachycardia with RVR.  Repeat HRCT in 9/25 showed minimal ILD.  - Continue to followup with pulmonary.    - OK to Continue amiodarone  100 mg daily.  Followup in 6 months with APP.   I spent 31 minutes reviewing records, interviewing/examining patient, and managing orders.     Brandon [name]  10/10/2024

## 2024-10-10 NOTE — Patient Instructions (Signed)
 There has been no changes to your medications.  Your physician recommends that you schedule a follow-up appointment in: 6 months.  If you have any questions or concerns before your next appointment please send us  a message through [facility] or call our office at [phone].    TO LEAVE A MESSAGE FOR THE NURSE SELECT OPTION 2, PLEASE LEAVE A MESSAGE INCLUDING: YOUR NAME DATE OF BIRTH CALL BACK NUMBER REASON FOR CALL**this is important as we prioritize the call backs  YOU WILL RECEIVE A CALL BACK THE SAME DAY AS LONG AS YOU CALL BEFORE 4:00 PM  At the [facility], you and your health needs are our priority. As part of our continuing mission to provide you with exceptional heart care, we have created designated Provider Care Teams. These Care Teams include your primary Cardiologist (physician) and Advanced Practice Providers (APPs- Physician Assistants and Nurse Practitioners) who all work together to provide you with the care you need, when you need it.   You may see any of the following providers on your designated Care Team at your next follow up: Dr Toribio Fuel Dr Ezra Shuck Dr. Morene Brownie Greig Mosses, NP Caffie Shed, [place] [facility] [place], [place] Beckey Coe, NP Jordan Lee, NP Ellouise Class, NP Tinnie Redman, PharmD Jaun Bash, PharmD   Please be sure to bring in all your medications bottles to every appointment.    Thank you for choosing [facility]

## 2024-10-14 ENCOUNTER — Encounter: Payer: Self-pay | Admitting: Surgery

## 2024-10-14 ENCOUNTER — Ambulatory Visit: Attending: Surgery | Admitting: Surgery

## 2024-10-14 VITALS — BP 122/76 | HR 61 | Temp 97.9°F | Ht 71.0 in | Wt 204.0 lb

## 2024-10-14 DIAGNOSIS — I6523 Occlusion and stenosis of bilateral carotid arteries: Secondary | ICD-10-CM | POA: Diagnosis not present

## 2024-10-14 NOTE — Progress Notes (Signed)
 [facility]  Patient name: Brandon [name] MRN: [id number] DOB: September 24, 1946 Sex: male   REASON FOR VISIT:    Follow-up  HISOTRY OF PRESENT ILLNESS:    Brandon [name] is a 78 y.o. male who I saw a month ago for carotid artery evaluation.The patient has a history of right carotid stenting in 2004 by Dr. Dolphus for stroke.  The patient currently has issues with short-term memory but not long-term memory.  A recent carotid bruit was auscultated and he was sent for an ultrasound.  He denies any new neurologic symptoms such as numbness or weakness in either extremity, slurred speech, or amaurosis fugax.  His ultrasound showed minimal left internal carotid stenosis and a patent right carotid stent however there was extensive common carotid plaque and so I sent him for a CT scan to better evaluate this.   Patient has a history of coronary artery disease.  He also suffers from congestive heart failure.  He has diabetes.  He is a former smoker with COPD.  He is medically managed for hypertension and diabetes.  He takes a statin for hypercholesterolemia.  He is on Coumadin  with history of valve repair   PAST MEDICAL HISTORY:   Past Medical History:  Diagnosis Date   Anemia 07/13/2017   Anxiety    Aortic insufficiency    Aortic stenosis, moderate 07/13/2017   Arthritis    back    Carotid stenosis    Right carotid stent (widely patent) 40 - 59% left plaque 11/13   CHF (congestive heart failure) (HCC)    Chronic kidney disease    COPD (chronic obstructive pulmonary disease) (HCC)    Coronary artery disease involving native coronary artery of native heart without angina pectoris    Depression    Diabetes (HCC)    Dyslipidemia    GERD (gastroesophageal reflux disease)    Heart murmur    Hemiplegia affecting unspecified side, late effect of cerebrovascular disease    resolved- from L side    Hypertension    Jaundice     resolved following ERCP & Cholecystectomy   Mild emphysema (HCC)    Mitral regurgitation    Mitral valve insufficiency and aortic valve insufficiency    Myocardial infarction (HCC)    Paroxysmal atrial fibrillation (HCC)    Pre-diabetes    per spouse   S/P aortic valve replacement with bioprosthetic valve 11/16/2017   23 mm Edwards Magna Ease stented bovine pericardial tissue valve   S/P CABG x 1 11/16/2017   SVG to PDA with [facility] via right thigh   S/P Maze operation for atrial fibrillation 11/16/2017   Left side lesion set using bipolar radiofrequency and cryothermy ablation with clipping of LA appendage   S/P mitral valve replacement with bioprosthetic valve 11/16/2017   27 mm [facility] Mitral stented bovine pericardial tissue valve   Sleep apnea    does not wear CPAP   Sleep concern    resulted in surgery- after + sleep test. Pt. doesn't have a problem any longer.    Stroke (HCC) 03/11/2003   stent placed on the 31, 3, 2004, L side    Wears glasses    Wears hearing aid in both ears    Wears partial dentures      FAMILY HISTORY:   Family History  Problem Relation Age of Onset   Stroke Father        No details   Angina Mother    Kidney  cancer Brother     SOCIAL HISTORY:   Social History   Tobacco Use   Smoking status: Former    Current packs/day: 0.00    Average packs/day: 0.5 packs/day for 57.0 years (28.5 ttl pk-yrs)    Types: Cigarettes    Start date: 80    Quit date: 2018    Years since quitting: 7.8   Smokeless tobacco: Never   Tobacco comments:    Former smoker 01/13/22  Substance Use Topics   Alcohol use: Yes    Alcohol/week: 14.0 standard drinks of alcohol    Types: 14 Standard drinks or equivalent per week    Comment: occasional glass of wine     ALLERGIES:   Allergies  Allergen Reactions   Amlodipine  Swelling   Bee Venom Anaphylaxis and Swelling   Bacitracin-Polymyxin B Other (See Comments)    Redness Other reaction(s): rash      CURRENT MEDICATIONS:   Current Outpatient Medications  Medication Sig Dispense Refill   Accu-Chek Softclix Lancets lancets Use as directed to check blood sugar daily and as needed 100 each 3   acetaminophen  (TYLENOL ) 500 MG tablet Take 1,000 mg by mouth every 6 (six) hours as needed (pain.).     amiodarone  (PACERONE ) 200 MG tablet Take 0.5 tablets (100 mg total) by mouth daily. 45 tablet 3   amoxicillin  (AMOXIL ) 500 MG capsule Take 4 capsules (2,000 mg total) by mouth as needed. 4 capsule 1   ascorbic acid (VITAMIN C) 500 MG tablet Take 500 mg by mouth daily.     Blood Glucose Monitoring Suppl (ACCU-CHEK GUIDE) w/Device KIT check your sugar once daily and as needed as directed 1 kit 0   budesonide -formoterol  (SYMBICORT ) 160-4.5 MCG/ACT inhaler Inhale 2 puffs into the lungs 2 times daily at 12 noon and 4 pm. 10.2 g 12   calcium  carbonate (TUMS - DOSED IN MG ELEMENTAL CALCIUM ) 500 MG chewable tablet Chew 1 tablet by mouth daily as needed for indigestion or heartburn.     Cholecalciferol (VITAMIN D3) 50 MCG (2000 UT) capsule Take 2,000 Units by mouth daily.     citalopram  (CELEXA ) 20 MG tablet Take 1 tablet (20 mg total) by mouth at bedtime. 30 tablet 5   docusate sodium  (COLACE) 100 MG capsule Take 1 capsule (100 mg total) by mouth 3 (three) times daily as needed. 20 capsule 0   empagliflozin  (JARDIANCE ) 10 MG TABS tablet TAKE 1 TABLET BY MOUTH ONCE A DAY BEFORE BREAKFAST 30 tablet 11   furosemide  (LASIX ) 20 MG tablet Take 1 tablet (20 mg total) by mouth every other day. 45 tablet 3   glucose blood (ACCU-CHEK GUIDE) test strip Use as directed to check blood sugar once daily and as needed 100 strip 3   loratadine (CLARITIN) 10 MG tablet Take 10 mg by mouth daily as needed for allergies or rhinitis.     Magnesium  250 MG CAPS Take 1 tablet by mouth daily at 6 (six) AM.     methocarbamol  (ROBAXIN ) 500 MG tablet Take 1 tablet (500 mg total) by mouth every 8 (eight) hours as needed for muscle  spasms. 30 tablet 0   pantoprazole  (PROTONIX ) 40 MG tablet Take 1 tablet (40 mg total) by mouth daily. 90 tablet 3   Pseudoeph-Doxylamine-DM-APAP (NYQUIL PO) Take 30 mLs by mouth daily as needed (Cold Symptons).     rosuvastatin  (CRESTOR ) 10 MG tablet TAKE 1 TABLET EVERY DAY 90 tablet 3   sacubitril -valsartan  (ENTRESTO ) 24-26 MG Take 1 tablet by mouth  2 (two) times daily. 180 tablet 2   spironolactone  (ALDACTONE ) 25 MG tablet TAKE 1 TABLET EVERY DAY 90 tablet 3   tirzepatide  (MOUNJARO ) 15 MG/0.5ML Pen Inject 15 mg into the skin once a week. 2 mL 11   traZODone  (DESYREL ) 50 MG tablet Take 100 mg by mouth at bedtime.     triamcinolone  ointment (KENALOG ) 0.1 % Apply 1 Application topically 2 (two) times daily. 60 g 0   warfarin (COUMADIN ) 2.5 MG tablet TAKE 1 TO 1 AND 1/2 TABLETS DAILY OR AS DIRECTED BY THE COUMADIN  CLINIC (Patient taking differently: TAKE1 tablet on Monday Wednesday Thursday Friday and Saturday 1 and1/2 TABLETS on Sundays and Tuesday  OR AS DIRECTED BY THE COUMADIN  CLINIC) 115 tablet 3   Zinc 50 MG TABS Take 50 mg by mouth daily.     No current facility-administered medications for this visit.    REVIEW OF SYSTEMS:   [X]  denotes positive finding, [ ]  denotes negative finding Cardiac  Comments:  Chest pain or chest pressure:    Shortness of breath upon exertion:    Short of breath when lying flat:    Irregular heart rhythm:        Vascular    Pain in calf, thigh, or hip brought on by ambulation:    Pain in feet at night that wakes you up from your sleep:     Blood clot in your veins:    Leg swelling:         Pulmonary    Oxygen at home:    Productive cough:     Wheezing:         Neurologic    Sudden weakness in arms or legs:     Sudden numbness in arms or legs:     Sudden onset of difficulty speaking or slurred speech:    Temporary loss of vision in one eye:     Problems with dizziness:         Gastrointestinal    Blood in stool:     Vomited blood:          Genitourinary    Burning when urinating:     Blood in urine:        Psychiatric    Major depression:         Hematologic    Bleeding problems:    Problems with blood clotting too easily:        Skin    Rashes or ulcers:        Constitutional    Fever or chills:      PHYSICAL EXAM:   Vitals:   10/14/24 1005 10/14/24 1007  BP: 126/77 122/76  Pulse: 61   Temp: 97.9 F (36.6 C)   SpO2: 98%   Weight: 204 lb (92.5 kg)   Height: 5' 11 (1.803 m)     GENERAL: The patient is a well-nourished male, in no acute distress. The vital signs are documented above. CARDIAC: There is a regular rate and rhythm.  PULMONARY: Non-labored respirations ABDOMEN: Soft and non-tender  MUSCULOSKELETAL: There are no major deformities or cyanosis. NEUROLOGIC: No focal weakness or paresthesias are detected. SKIN: There are no ulcers or rashes noted. PSYCHIATRIC: The patient has a normal affect.  STUDIES:   I have reviewed the following CTA: 1. Right carotid stent is patent with approximately 50% stenosis in the proximal cervical ICA within the stent segment, without hemodynamically significant narrowing at the stent origin. 2. Left carotid atherosclerosis with approximately 50%  stenosis at the left cervical ICA origin and approximately 60% stenosis in the proximal cervical ICA. Additional moderate distal common carotid artery stenosis and subtotal occlusion at the external carotid artery origin. 3. Mild to moderate intracranial carotid artery stenoses, including mild right cavernous/paraclinoid ICA stenosis and mild to moderate left cavernous ICA stenosis. 4. Mild to moderate atherosclerosis of the aortic arch and subclavian arteries with mild stenoses in the proximal/mid left subclavian and distal right subclavian arteries. 5. Mild atherosclerotic stenoses at the origins of the bilateral vertebral arteries. 6. Chronic left maxillary sinusitis.  MEDICAL ISSUES:   Carotid: CT scan  shows that the right carotid stent is patent with only about 50% stenosis and the left side stenosis is only about 50%.  The CT scan was done to evaluate the plaque in the common carotid artery which does not appear to be hemodynamically significant.  Therefore no intervention is recommended.  He will return in 6 months with a carotid ultrasound understanding that this has limited value and determining progression of the common carotid disease and so he may need CT scans at a later date to better evaluate this.    Malvina Serene CLORE, MD, FACS [facility] [phone] Pager [phone]

## 2024-10-17 ENCOUNTER — Other Ambulatory Visit: Payer: Self-pay | Admitting: *Deleted

## 2024-10-17 DIAGNOSIS — I6523 Occlusion and stenosis of bilateral carotid arteries: Secondary | ICD-10-CM

## 2024-10-29 ENCOUNTER — Other Ambulatory Visit: Payer: Self-pay

## 2024-10-29 ENCOUNTER — Other Ambulatory Visit: Payer: Self-pay | Admitting: *Deleted

## 2024-10-29 ENCOUNTER — Other Ambulatory Visit (HOSPITAL_COMMUNITY): Payer: Self-pay

## 2024-10-29 DIAGNOSIS — Z953 Presence of xenogenic heart valve: Secondary | ICD-10-CM

## 2024-10-29 MED ORDER — WARFARIN SODIUM 2.5 MG PO TABS
ORAL_TABLET | ORAL | 3 refills | Status: AC
  Filled 2024-10-29: qty 100, 66d supply, fill #0
  Filled 2025-01-08: qty 100, 66d supply, fill #1

## 2024-10-29 NOTE — Telephone Encounter (Signed)
 Warfarin 2.5mg  refill Afib  Last INR 10/08/24 Last OV 10/10/24

## 2024-11-04 DIAGNOSIS — N183 Chronic kidney disease, stage 3 unspecified: Secondary | ICD-10-CM | POA: Diagnosis not present

## 2024-11-04 DIAGNOSIS — R339 Retention of urine, unspecified: Secondary | ICD-10-CM | POA: Diagnosis not present

## 2024-11-04 DIAGNOSIS — N2581 Secondary hyperparathyroidism of renal origin: Secondary | ICD-10-CM | POA: Diagnosis not present

## 2024-11-04 DIAGNOSIS — R7303 Prediabetes: Secondary | ICD-10-CM | POA: Diagnosis not present

## 2024-11-04 DIAGNOSIS — D631 Anemia in chronic kidney disease: Secondary | ICD-10-CM | POA: Diagnosis not present

## 2024-11-04 DIAGNOSIS — I129 Hypertensive chronic kidney disease with stage 1 through stage 4 chronic kidney disease, or unspecified chronic kidney disease: Secondary | ICD-10-CM | POA: Diagnosis not present

## 2024-11-04 DIAGNOSIS — I480 Paroxysmal atrial fibrillation: Secondary | ICD-10-CM | POA: Diagnosis not present

## 2024-11-04 DIAGNOSIS — N189 Chronic kidney disease, unspecified: Secondary | ICD-10-CM | POA: Diagnosis not present

## 2024-11-05 ENCOUNTER — Ambulatory Visit

## 2024-11-06 ENCOUNTER — Ambulatory Visit: Attending: Internal Medicine | Admitting: *Deleted

## 2024-11-06 DIAGNOSIS — Z5181 Encounter for therapeutic drug level monitoring: Secondary | ICD-10-CM | POA: Diagnosis not present

## 2024-11-06 DIAGNOSIS — Z9889 Other specified postprocedural states: Secondary | ICD-10-CM

## 2024-11-06 DIAGNOSIS — I480 Paroxysmal atrial fibrillation: Secondary | ICD-10-CM | POA: Diagnosis not present

## 2024-11-06 DIAGNOSIS — Z953 Presence of xenogenic heart valve: Secondary | ICD-10-CM

## 2024-11-06 DIAGNOSIS — Z8679 Personal history of other diseases of the circulatory system: Secondary | ICD-10-CM

## 2024-11-06 LAB — POCT INR: INR: 3.1 — AB (ref 2.0–3.0)

## 2024-11-06 NOTE — Patient Instructions (Addendum)
 Description   INR-3.1; Tomorrow take 1/2 tablet of warfarin and then continue taking 1 tablet daily except 1/2 tablet on Wednesdays.  Stay consistent with greens (2-3 times per week).  Recheck INR in 5 weeks.  Coumadin  Clinic [phone]. HAVE DENTAL OFFICE FAX CLEARANCE FORM TO CARDIOLOGY OFFICE.

## 2024-11-06 NOTE — Progress Notes (Signed)
 Description   INR-3.1; Tomorrow take 1/2 tablet of warfarin and then continue taking 1 tablet daily except 1/2 tablet on Wednesdays.  Stay consistent with greens (2-3 times per week).  Recheck INR in 5 weeks.  Coumadin  Clinic [phone]. HAVE DENTAL OFFICE FAX CLEARANCE FORM TO CARDIOLOGY OFFICE.

## 2024-11-11 ENCOUNTER — Other Ambulatory Visit (HOSPITAL_COMMUNITY): Payer: Self-pay | Admitting: Cardiology

## 2024-11-28 ENCOUNTER — Ambulatory Visit

## 2024-11-28 DIAGNOSIS — I442 Atrioventricular block, complete: Secondary | ICD-10-CM

## 2024-11-29 LAB — CUP PACEART REMOTE DEVICE CHECK
Battery Remaining Longevity: 92 mo
Battery Voltage: 2.99 V
Brady Statistic AP VP Percent: 0.01 %
Brady Statistic AP VS Percent: 1.27 %
Brady Statistic AS VP Percent: 0.08 %
Brady Statistic AS VS Percent: 98.64 %
Brady Statistic RA Percent Paced: 1.41 %
Brady Statistic RV Percent Paced: 0.09 %
Date Time Interrogation Session: 20251217225623
Implantable Lead Connection Status: 753985
Implantable Lead Connection Status: 753985
Implantable Lead Implant Date: 20181211
Implantable Lead Implant Date: 20181211
Implantable Lead Location: 753859
Implantable Lead Location: 753860
Implantable Lead Model: 3830
Implantable Lead Model: 5076
Implantable Pulse Generator Implant Date: 20181211
Lead Channel Impedance Value: 304 Ohm
Lead Channel Impedance Value: 342 Ohm
Lead Channel Impedance Value: 418 Ohm
Lead Channel Impedance Value: 437 Ohm
Lead Channel Pacing Threshold Amplitude: 0.5 V
Lead Channel Pacing Threshold Amplitude: 1.125 V
Lead Channel Pacing Threshold Pulse Width: 0.4 ms
Lead Channel Pacing Threshold Pulse Width: 0.4 ms
Lead Channel Sensing Intrinsic Amplitude: 4.5 mV
Lead Channel Sensing Intrinsic Amplitude: 4.5 mV
Lead Channel Sensing Intrinsic Amplitude: 5.5 mV
Lead Channel Sensing Intrinsic Amplitude: 5.5 mV
Lead Channel Setting Pacing Amplitude: 2 V
Lead Channel Setting Pacing Amplitude: 2.5 V
Lead Channel Setting Pacing Pulse Width: 0.7 ms
Lead Channel Setting Sensing Sensitivity: 1.2 mV
Zone Setting Status: 755011
Zone Setting Status: 755011

## 2024-12-01 NOTE — Progress Notes (Signed)
 Remote PPM Transmission

## 2024-12-06 ENCOUNTER — Ambulatory Visit: Payer: Self-pay | Admitting: Cardiology

## 2024-12-09 ENCOUNTER — Ambulatory Visit: Admitting: Podiatry

## 2024-12-10 ENCOUNTER — Ambulatory Visit: Attending: Internal Medicine | Admitting: Pharmacist

## 2024-12-10 ENCOUNTER — Ambulatory Visit (INDEPENDENT_AMBULATORY_CARE_PROVIDER_SITE_OTHER): Admitting: Podiatry

## 2024-12-10 ENCOUNTER — Encounter: Payer: Self-pay | Admitting: Podiatry

## 2024-12-10 VITALS — Ht 71.0 in | Wt 204.0 lb

## 2024-12-10 DIAGNOSIS — M79675 Pain in left toe(s): Secondary | ICD-10-CM | POA: Diagnosis not present

## 2024-12-10 DIAGNOSIS — M79674 Pain in right toe(s): Secondary | ICD-10-CM | POA: Diagnosis not present

## 2024-12-10 DIAGNOSIS — Z5181 Encounter for therapeutic drug level monitoring: Secondary | ICD-10-CM

## 2024-12-10 DIAGNOSIS — Z7901 Long term (current) use of anticoagulants: Secondary | ICD-10-CM | POA: Diagnosis not present

## 2024-12-10 DIAGNOSIS — I480 Paroxysmal atrial fibrillation: Secondary | ICD-10-CM

## 2024-12-10 DIAGNOSIS — Z8679 Personal history of other diseases of the circulatory system: Secondary | ICD-10-CM

## 2024-12-10 DIAGNOSIS — B351 Tinea unguium: Secondary | ICD-10-CM | POA: Diagnosis not present

## 2024-12-10 DIAGNOSIS — Z9889 Other specified postprocedural states: Secondary | ICD-10-CM

## 2024-12-10 DIAGNOSIS — Z953 Presence of xenogenic heart valve: Secondary | ICD-10-CM

## 2024-12-10 LAB — POCT INR: INR: 1.9 — AB (ref 2.0–3.0)

## 2024-12-10 NOTE — Progress Notes (Signed)
 Description   INR-1.9; Take 1.5 tablets today and then continue taking 1 tablet daily except 1/2 tablet on Wednesdays.  Stay consistent with greens (2-3 times per week).  Recheck INR in 5 weeks.  Coumadin  Clinic [phone]. HAVE DENTAL OFFICE FAX CLEARANCE FORM TO CARDIOLOGY OFFICE.

## 2024-12-10 NOTE — Progress Notes (Signed)
This patient returns to my office for at risk foot care.  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  This patient is unable to cut nails himself since the patient cannot reach his nails.These nails are painful walking and wearing shoes.  This patient presents for at risk foot care today.  General Appearance  Alert, conversant and in no acute stress.  Vascular  Dorsalis pedis and posterior tibial  pulses are palpable  bilaterally.  Capillary return is within normal limits  bilaterally. Temperature is within normal limits  bilaterally.  Neurologic  Senn-Weinstein monofilament wire test within normal limits  bilaterally. Muscle power within normal limits bilaterally.  Nails Thick disfigured discolored nails with subungual debris  from hallux to fifth toes bilaterally. No evidence of bacterial infection or drainage bilaterally.  Orthopedic  No limitations of motion  feet .  No crepitus or effusions noted.  No bony pathology or digital deformities noted.  Skin  normotropic skin with no porokeratosis noted bilaterally.  No signs of infections or ulcers noted.     Onychomycosis  Pain in right toes  Pain in left toes  Consent was obtained for treatment procedures.   Mechanical debridement of nails 1-5  bilaterally performed with a nail nipper.  Filed with dremel without incident.    Return office visit    3 months                  Told patient to return for periodic foot care and evaluation due to potential at risk complications.   Khyla Mccumbers DPM   

## 2024-12-10 NOTE — Patient Instructions (Signed)
 Description   INR-1.9; Take 1.5 tablets today and then continue taking 1 tablet daily except 1/2 tablet on Wednesdays.  Stay consistent with greens (2-3 times per week).  Recheck INR in 5 weeks.  Coumadin  Clinic [phone]. HAVE DENTAL OFFICE FAX CLEARANCE FORM TO CARDIOLOGY OFFICE.

## 2024-12-26 ENCOUNTER — Telehealth (HOSPITAL_COMMUNITY): Payer: Self-pay

## 2024-12-26 ENCOUNTER — Other Ambulatory Visit (HOSPITAL_COMMUNITY): Payer: Self-pay | Admitting: Cardiology

## 2024-12-26 ENCOUNTER — Other Ambulatory Visit (HOSPITAL_COMMUNITY): Payer: Self-pay

## 2024-12-26 DIAGNOSIS — I5022 Chronic systolic (congestive) heart failure: Secondary | ICD-10-CM

## 2024-12-26 NOTE — Telephone Encounter (Signed)
 Where is this request coming from? Brandon [name]  Medication: Jardiance  10 mg Prior authorization required? Yes If YES, on primary or secondary insurance? Primary Comments:

## 2024-12-27 ENCOUNTER — Other Ambulatory Visit (HOSPITAL_COMMUNITY): Payer: Self-pay

## 2024-12-29 ENCOUNTER — Other Ambulatory Visit: Payer: Self-pay

## 2024-12-29 ENCOUNTER — Other Ambulatory Visit (HOSPITAL_COMMUNITY): Payer: Self-pay

## 2024-12-30 ENCOUNTER — Telehealth (HOSPITAL_COMMUNITY): Payer: Self-pay

## 2024-12-30 ENCOUNTER — Telehealth: Payer: Self-pay | Admitting: Pharmacy Technician

## 2024-12-30 ENCOUNTER — Other Ambulatory Visit (HOSPITAL_COMMUNITY): Payer: Self-pay

## 2024-12-30 ENCOUNTER — Other Ambulatory Visit: Payer: Self-pay

## 2024-12-30 ENCOUNTER — Other Ambulatory Visit (HOSPITAL_COMMUNITY): Payer: Self-pay | Admitting: Cardiology

## 2024-12-30 MED ORDER — SACUBITRIL-VALSARTAN 24-26 MG PO TABS
1.0000 | ORAL_TABLET | Freq: Two times a day (BID) | ORAL | 2 refills | Status: AC
  Filled 2024-12-30 (×2): qty 180, 90d supply, fill #0

## 2024-12-30 NOTE — Telephone Encounter (Signed)
 Pharmacy Patient Advocate Encounter  Received notification from [employer] that Prior Authorization for jardiance  has been APPROVED from 12/12/24 to 12/11/25   PA #/Case ID/Reference #: [id number]

## 2024-12-30 NOTE — Telephone Encounter (Signed)
 PA request has been Submitted. New Encounter has been or will be created for follow up. For additional info see Pharmacy Prior Auth telephone encounter from 12/30/24.

## 2024-12-30 NOTE — Telephone Encounter (Signed)
 Advanced Heart Failure Patient Advocate Encounter  The patient was approved for a [employer] grant that will help cover the cost of Entresto , Jardiance , Spironolactone .  Total amount awarded, $7,500.  Effective: 11/30/2024 - 11/29/2025.  BIN [id number] PCN [id number] [id number] ID [id number]  Pharmacy provided with approval and processing information. Confirmed $0 copay for Jardiance , this has also brought copays for Symbicort  and Mounjaro  down to $47 each (ready for pickup) Patient informed via paramedicine.  Rachel DEL, CPhT Rx Patient Advocate Phone: [phone]

## 2024-12-30 NOTE — Telephone Encounter (Signed)
 Pharmacy Patient Advocate Encounter   Received notification from Pt Calls Messages-sarah that prior authorization for Jardiance  10 mg  is required/requested.   Insurance verification completed.   The patient is insured through [employer].   Per test claim: PA required; PA submitted to above mentioned insurance via Latent Key/confirmation #/EOC [id number] Status is pending

## 2024-12-31 ENCOUNTER — Other Ambulatory Visit (HOSPITAL_COMMUNITY): Payer: Self-pay

## 2025-01-14 ENCOUNTER — Ambulatory Visit

## 2025-01-20 ENCOUNTER — Ambulatory Visit

## 2025-01-22 ENCOUNTER — Ambulatory Visit

## 2025-02-27 ENCOUNTER — Encounter

## 2025-03-10 ENCOUNTER — Ambulatory Visit: Admitting: Podiatry

## 2025-04-10 ENCOUNTER — Ambulatory Visit (HOSPITAL_COMMUNITY)

## 2025-04-14 ENCOUNTER — Ambulatory Visit (HOSPITAL_COMMUNITY)

## 2025-04-21 ENCOUNTER — Ambulatory Visit: Admitting: Surgery

## 2025-05-29 ENCOUNTER — Encounter

## 2025-08-28 ENCOUNTER — Encounter
